# Patient Record
Sex: Female | Born: 1945
Health system: Southern US, Community
[De-identification: ages and names within clinical notes are randomized; demographics above are authoritative.]

## PROBLEM LIST (undated history)

## (undated) DIAGNOSIS — R350 Frequency of micturition: Secondary | ICD-10-CM

## (undated) DIAGNOSIS — J40 Bronchitis, not specified as acute or chronic: Secondary | ICD-10-CM

## (undated) DIAGNOSIS — IMO0002 Reserved for concepts with insufficient information to code with codable children: Secondary | ICD-10-CM

## (undated) DIAGNOSIS — F418 Other specified anxiety disorders: Secondary | ICD-10-CM

## (undated) DIAGNOSIS — G4730 Sleep apnea, unspecified: Secondary | ICD-10-CM

## (undated) DIAGNOSIS — H409 Unspecified glaucoma: Secondary | ICD-10-CM

## (undated) DIAGNOSIS — Z8709 Personal history of other diseases of the respiratory system: Secondary | ICD-10-CM

## (undated) DIAGNOSIS — I10 Essential (primary) hypertension: Secondary | ICD-10-CM

## (undated) DIAGNOSIS — K219 Gastro-esophageal reflux disease without esophagitis: Secondary | ICD-10-CM

## (undated) DIAGNOSIS — E785 Hyperlipidemia, unspecified: Secondary | ICD-10-CM

## (undated) DIAGNOSIS — I739 Peripheral vascular disease, unspecified: Secondary | ICD-10-CM

## (undated) DIAGNOSIS — J189 Pneumonia, unspecified organism: Secondary | ICD-10-CM

## (undated) DIAGNOSIS — Z720 Tobacco use: Secondary | ICD-10-CM

## (undated) DIAGNOSIS — N3281 Overactive bladder: Secondary | ICD-10-CM

## (undated) DIAGNOSIS — M797 Fibromyalgia: Secondary | ICD-10-CM

## (undated) HISTORY — DX: Peripheral vascular disease, unspecified: I73.9

## (undated) HISTORY — PX: BREAST SURGERY: SHX581

## (undated) HISTORY — PX: DILATION AND CURETTAGE OF UTERUS: SHX78

## (undated) HISTORY — PX: COLONOSCOPY: SHX174

## (undated) HISTORY — DX: Gastro-esophageal reflux disease without esophagitis: K21.9

## (undated) HISTORY — DX: Other specified anxiety disorders: F41.8

## (undated) HISTORY — PX: TONSILLECTOMY: SUR1361

## (undated) HISTORY — DX: Tobacco use: Z72.0

## (undated) HISTORY — DX: Bronchitis, not specified as acute or chronic: J40

## (undated) HISTORY — DX: Hyperlipidemia, unspecified: E78.5

## (undated) HISTORY — PX: OTHER SURGICAL HISTORY: SHX169

## (undated) HISTORY — DX: Fibromyalgia: M79.7

## (undated) HISTORY — PX: BUNIONECTOMY: SHX129

## (undated) HISTORY — DX: Essential (primary) hypertension: I10

---

## 2005-08-21 ENCOUNTER — Ambulatory Visit: Payer: Self-pay | Admitting: Family Medicine

## 2005-10-27 ENCOUNTER — Ambulatory Visit: Payer: Self-pay | Admitting: Family Medicine

## 2005-11-10 ENCOUNTER — Ambulatory Visit: Payer: Self-pay | Admitting: Family Medicine

## 2006-05-17 ENCOUNTER — Ambulatory Visit: Payer: Self-pay | Admitting: Family Medicine

## 2006-10-02 ENCOUNTER — Ambulatory Visit: Payer: Self-pay | Admitting: Family Medicine

## 2006-10-16 ENCOUNTER — Ambulatory Visit: Payer: Self-pay | Admitting: Family Medicine

## 2006-10-23 ENCOUNTER — Ambulatory Visit: Payer: Self-pay | Admitting: Family Medicine

## 2006-11-09 ENCOUNTER — Ambulatory Visit: Payer: Self-pay | Admitting: Family Medicine

## 2006-11-23 ENCOUNTER — Ambulatory Visit: Payer: Self-pay | Admitting: Family Medicine

## 2007-01-31 ENCOUNTER — Ambulatory Visit: Payer: Self-pay | Admitting: *Deleted

## 2007-02-28 ENCOUNTER — Encounter: Admission: RE | Admit: 2007-02-28 | Discharge: 2007-02-28 | Payer: Self-pay | Admitting: *Deleted

## 2007-02-28 ENCOUNTER — Ambulatory Visit: Payer: Self-pay | Admitting: *Deleted

## 2007-03-11 ENCOUNTER — Ambulatory Visit (HOSPITAL_COMMUNITY): Admission: RE | Admit: 2007-03-11 | Discharge: 2007-03-11 | Payer: Self-pay | Admitting: *Deleted

## 2007-03-11 ENCOUNTER — Ambulatory Visit: Payer: Self-pay | Admitting: *Deleted

## 2007-03-14 ENCOUNTER — Ambulatory Visit: Payer: Self-pay | Admitting: *Deleted

## 2007-03-28 ENCOUNTER — Ambulatory Visit: Payer: Self-pay | Admitting: *Deleted

## 2007-04-08 ENCOUNTER — Ambulatory Visit: Payer: Self-pay | Admitting: *Deleted

## 2007-04-08 ENCOUNTER — Ambulatory Visit (HOSPITAL_COMMUNITY): Admission: RE | Admit: 2007-04-08 | Discharge: 2007-04-08 | Payer: Self-pay | Admitting: *Deleted

## 2007-04-18 ENCOUNTER — Ambulatory Visit: Payer: Self-pay | Admitting: *Deleted

## 2007-06-06 ENCOUNTER — Ambulatory Visit: Payer: Self-pay | Admitting: *Deleted

## 2007-07-23 ENCOUNTER — Ambulatory Visit: Payer: Self-pay | Admitting: Vascular Surgery

## 2007-09-26 ENCOUNTER — Emergency Department (HOSPITAL_COMMUNITY): Admission: EM | Admit: 2007-09-26 | Discharge: 2007-09-26 | Payer: Self-pay | Admitting: Emergency Medicine

## 2007-10-23 ENCOUNTER — Ambulatory Visit: Payer: Self-pay | Admitting: *Deleted

## 2008-05-04 ENCOUNTER — Other Ambulatory Visit: Admission: RE | Admit: 2008-05-04 | Discharge: 2008-05-04 | Payer: Self-pay | Admitting: Family Medicine

## 2008-05-07 ENCOUNTER — Ambulatory Visit: Payer: Self-pay | Admitting: *Deleted

## 2008-05-14 ENCOUNTER — Ambulatory Visit: Payer: Self-pay | Admitting: Surgery

## 2008-05-14 ENCOUNTER — Ambulatory Visit (HOSPITAL_COMMUNITY): Admission: RE | Admit: 2008-05-14 | Discharge: 2008-05-14 | Payer: Self-pay | Admitting: *Deleted

## 2008-05-28 ENCOUNTER — Ambulatory Visit: Payer: Self-pay | Admitting: *Deleted

## 2008-09-17 ENCOUNTER — Ambulatory Visit: Payer: Self-pay | Admitting: *Deleted

## 2008-10-26 ENCOUNTER — Ambulatory Visit: Payer: Self-pay | Admitting: *Deleted

## 2008-11-20 HISTORY — PX: HAND SURGERY: SHX662

## 2008-12-03 ENCOUNTER — Ambulatory Visit: Payer: Self-pay | Admitting: *Deleted

## 2009-06-03 ENCOUNTER — Ambulatory Visit: Payer: Self-pay | Admitting: *Deleted

## 2009-06-12 ENCOUNTER — Emergency Department (HOSPITAL_COMMUNITY): Admission: EM | Admit: 2009-06-12 | Discharge: 2009-06-12 | Payer: Self-pay | Admitting: Emergency Medicine

## 2009-09-06 ENCOUNTER — Ambulatory Visit (HOSPITAL_BASED_OUTPATIENT_CLINIC_OR_DEPARTMENT_OTHER): Admission: RE | Admit: 2009-09-06 | Discharge: 2009-09-06 | Payer: Self-pay | Admitting: Orthopedic Surgery

## 2010-05-26 ENCOUNTER — Encounter: Admission: RE | Admit: 2010-05-26 | Discharge: 2010-05-26 | Payer: Self-pay | Admitting: Orthopaedic Surgery

## 2010-07-26 ENCOUNTER — Encounter
Admission: RE | Admit: 2010-07-26 | Discharge: 2010-07-26 | Payer: Self-pay | Source: Home / Self Care | Admitting: Orthopaedic Surgery

## 2010-10-27 ENCOUNTER — Ambulatory Visit: Payer: Self-pay

## 2010-10-27 ENCOUNTER — Ambulatory Visit: Payer: Self-pay | Admitting: Vascular Surgery

## 2010-11-04 ENCOUNTER — Ambulatory Visit (HOSPITAL_COMMUNITY)
Admission: RE | Admit: 2010-11-04 | Discharge: 2010-11-04 | Payer: Self-pay | Source: Home / Self Care | Attending: Vascular Surgery | Admitting: Vascular Surgery

## 2010-11-24 ENCOUNTER — Ambulatory Visit
Admission: RE | Admit: 2010-11-24 | Discharge: 2010-11-24 | Payer: Self-pay | Source: Home / Self Care | Attending: Vascular Surgery | Admitting: Vascular Surgery

## 2011-01-30 LAB — POCT I-STAT, CHEM 8
BUN: 18 mg/dL (ref 6–23)
Calcium, Ion: 1.16 mmol/L (ref 1.12–1.32)
Chloride: 108 mEq/L (ref 96–112)
Creatinine, Ser: 0.9 mg/dL (ref 0.4–1.2)
Glucose, Bld: 101 mg/dL — ABNORMAL HIGH (ref 70–99)
HCT: 44 % (ref 36.0–46.0)
Hemoglobin: 15 g/dL (ref 12.0–15.0)
Potassium: 4.1 mEq/L (ref 3.5–5.1)
Sodium: 142 mEq/L (ref 135–145)
TCO2: 25 mmol/L (ref 0–100)

## 2011-01-30 LAB — GLUCOSE, CAPILLARY
Glucose-Capillary: 115 mg/dL — ABNORMAL HIGH (ref 70–99)
Glucose-Capillary: 84 mg/dL (ref 70–99)
Glucose-Capillary: 96 mg/dL (ref 70–99)

## 2011-02-23 LAB — BASIC METABOLIC PANEL
BUN: 5 mg/dL — ABNORMAL LOW (ref 6–23)
CO2: 28 mEq/L (ref 19–32)
Calcium: 9.6 mg/dL (ref 8.4–10.5)
Chloride: 106 mEq/L (ref 96–112)
Creatinine, Ser: 0.75 mg/dL (ref 0.4–1.2)
GFR calc Af Amer: >60 mL/min (ref >60)
GFR calc non Af Amer: >60 mL/min (ref >60)
Glucose, Bld: 101 mg/dL — ABNORMAL HIGH (ref 70–99)
Potassium: 4 mEq/L (ref 3.5–5.1)
Sodium: 142 mEq/L (ref 135–145)

## 2011-02-23 LAB — GLUCOSE, CAPILLARY
Glucose-Capillary: 115 mg/dL — ABNORMAL HIGH (ref 70–99)
Glucose-Capillary: 129 mg/dL — ABNORMAL HIGH (ref 70–99)

## 2011-02-23 LAB — POCT HEMOGLOBIN-HEMACUE: Hemoglobin: 14.2 g/dL (ref 12.0–15.0)

## 2011-02-26 LAB — GLUCOSE, CAPILLARY: Glucose-Capillary: 117 mg/dL — ABNORMAL HIGH (ref 70–99)

## 2011-04-04 NOTE — H&P (Signed)
HISTORY AND PHYSICAL EXAMINATION   October 27, 2010   Re:  Jill Higgins, Jill Higgins               DOB:  1946-03-27   DATE OF SURGERY:  05/14/2008, which consisted of a bilateral lower  extremity runoff with transluminal angioplasty of the right superficial  femoral artery by Dr. Myra Gianotti.   Patient is a very pleasant 65 year old woman who is currently employed  with [employer].  She has been seen and treated in the past by Dr. Madilyn Fireman,  who placed stents into both the left and right SFA at various times.  She was last seen by Dr. Madilyn Fireman 12/03/2008.  Over the past since she was  last seen, she has noticed an increase in her symptoms.  She now feels  that she is severely limited in her work.  She is unable to walk more  than 30 steps without having pain in her left lower extremity and also  has had some episodes of rest pain.  She denies nonhealing lesions or a  history of gangrene.   PAST MEDICAL HISTORY:  Significant for hypertension, hyperlipidemia, and  major risk factor of type 2 diabetes.  She has had little relief with  the Pletal.   SOCIAL HISTORY:  She does smoke approximately 1 pack of cigarettes every  3-4 days.   FAMILY HISTORY:  Noncontributory.   CURRENT MEDICATIONS:  Include Janumet 50/100 b.i.d., temazepam 30 mg  p.o. q.h.s. p.r.n. sleep, Crestor 10 mg p.o. daily, atenolol 50 mg p.o.  daily, Azor 05/40 daily, aspirin 81 mg p.o. daily, Plavix 75 mg daily,  glipizide ER daily, and Ativan 0.5 mg p.r.n.   Physical findings revealed a very pleasant woman in no apparent  distress.  Heart rate was 57, blood pressure 168/89, O2 sat was 99%.  HEENT: PERRLA.  EOMI.  Normal conjunctiva.  Lungs: Clear to  auscultation.  Cardiac exam revealed a regular rate and rhythm.  The  abdomen is soft, nontender, nondistended.  Musculoskeletal exam  demonstrated no major deformities.  Neurological exam was nonfocal.  Evaluation of the skin demonstrated no ulcers or rashes.   Laboratory results demonstrated a SFA popliteal on the right with a  stent, which was triphasic with mild diffuse plaque observed.  The left  SFA appeared occluded from the SFA origin to the distal thigh, where it  was reconstituted with collaterals.   At this time, I asked Dr. Darrick Penna for his assistance in evaluation of  this patient.  He entered the room and evaluated the patient.  Recommended an aortogram with bilateral runoff to define the anatomy of  her lower extremity arterial system.  She is agreeable to this, and this  will be done on 11/04/2010.   Risks benefits and procedure details explained to patient.  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  I agree with the above exam.   Wilmon Arms, PA   Janetta Hora. Fields, MD  Electronically Signed   KEL/MEDQ  D:  10/27/2010  T:  10/27/2010  Job:  [id number]

## 2011-04-04 NOTE — Assessment & Plan Note (Signed)
OFFICE VISIT   [name], [name]  DOB:  Aug 12, 1946                                       04/18/2007  ZOXWR#:[id number]   The patient returns to the office today now status post bilateral lower  extremity SFA intervention.  Most recently underwent right SFA  angioplasty on Apr 08, 2007.  This was carried out for moderate diffuse  SFA disease.  She underwent left PTA and stent March 11, 2007.   She is out of Plavix at this time.  She has noticed some continued  symptoms in her right leg, however, the left leg is free of symptoms.   Permission to return to work as of Monday April 22, 2007.  Return to the  office in approximately 1 month for ABIs.   Balinda Quails, M.D.  Electronically Signed   PGH/MEDQ  D:  04/18/2007  T:  04/18/2007  Job:  [id number]

## 2011-04-04 NOTE — Assessment & Plan Note (Signed)
OFFICE VISIT   Jill Higgins, Jill Higgins  DOB:  1946/07/23                                       11/24/2010  GNFAO#:[id number]   The patient returns for follow-up today.  She underwent aortogram with  bilateral lower extremity runoff for left leg claudication on December  16.  This showed, as predicted, a long left superficial femoral artery  occlusion.  She did have reconstitution of a short segment of the above-  knee popliteal artery right at the knee joint.  Of note, she also had  aberrant anterior tibial artery takeoff coming right at the level of the  knee joint as well.  In light of this patient, anatomically speaking,  would possibly be a candidate for a femoral to above-knee popliteal  bypass but more likely would require a below-knee bypass or possibly  even an anterior tibial artery bypass.  I had a lengthy discussion with  the patient today regarding this and she has had several weeks to think  about the possible options of conservative management of smoking  cessation combined with a walking program versus considering a bypass  operation for her left leg.   PHYSICAL EXAMINATION TODAY:  Blood pressure 135/81 in the left arm,  heart rate 62 and regular, temperature is 98.  Skin:  She has no open  ulcers or rashes on her feet.   In discussions with the patient today we talked about durability of  bypasses versus limb loss long-term.  I discussed with her that the  durability of a below-knee bypass would probably be 75% at 5 years, at  which time she would be at risk of limb loss.  I also discussed with her  that her risk of limb loss lifelong if she is able to quit smoking and  begin a walking program would be less than 5% overall.  She has opted at  this point for conservative management with an attempt at smoking  cessation and a walking program.  She is going to start nicotine patches  soon.  We will also consider placing her on Chantix if she does not  have  success with nicotine patches alone.  We could also revisit in the  future a bypass operation if she wishes to do that at some point.   She will follow up with me in 6 months' time.     Janetta Hora. Fields, MD  Electronically Signed   CEF/MEDQ  D:  11/24/2010  T:  11/25/2010  Job:  [id number]   cc:   Dario Guardian, M.D.

## 2011-04-04 NOTE — Assessment & Plan Note (Signed)
OFFICE VISIT   Jill Higgins, Jill Higgins  DOB:  [birth year]                                       09/17/2008  ZOXWR#:[id number]   The patient returned to the office today for continued monitoring of her  peripheral vascular disease.  She has a history of a left superficial  femoral artery stent placement along with right SFA PTA.  She continues  to be disabled by claudication symptoms.  Unfortunately her disease  pattern is one that is very difficult to treat from a percutaneous point  of view.  She has extensive plaque throughout superficial femoral  arteries bilaterally with diffuse disease.  No single dominant lesion  for angioplasty and stenting.   She continues to claudicate at one to two blocks.  Unable to walk an  incline at all without significant discomfort.  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   Ankle brachial indices 0.65 on the right and 0.47 on the left.  Blood  pressure is 125/84, pulse is 64 per minute.  The patient appears  generally well and in no distress.  She is alert and oriented.  Lower  extremities are well-perfused.  No acute ischemic changes.   The patient remains significantly disabled by claudication symptoms.  Difficult to treat without major operative reconstruction.  I have  recommended conservative treatment at this time.  I have prescribed  Pletal 100 mg b.i.d.  Follow up in 6 months.   Balinda Quails, M.D.  Electronically Signed   PGH/MEDQ  D:  09/17/2008  T:  09/18/2008  Job:  [id number]

## 2011-04-04 NOTE — Procedures (Signed)
VASCULAR LAB EXAM   INDICATION:  Followup right SFA PTA.   HISTORY:  Diabetes:  Yes.  Cardiac:  No.  Hypertension:  Yes.   EXAM:  Duplex of the right lower extremity arterial system.   IMPRESSION:  1. Increased velocity (high-grade stenosis noted in the right      superficial femoral and common femoral artery origin).  2. Monophasic Doppler waveform noted throughout the right superficial      femoral system, popliteal, posterior tibial and peroneal arteries.   ___________________________________________  P. Liliane Bade, M.D.   MG/MEDQ  D:  09/17/2008  T:  09/17/2008  Job:  [id number]

## 2011-04-04 NOTE — Assessment & Plan Note (Signed)
OFFICE VISIT   Jill Higgins, Jill Higgins  DOB:  11-01-1946                                       06/06/2007  CHART#:[id number]   The patient is now status post bilateral superficial femoral artery  interventions with PTA of the right SFA and PTA and stenting of the left  SFA.   Ankle brachial indices measure 0.80 on the right, 0.86 on the left.  Wave forms are biphasic bilaterally.   The patient does continue to complain of some pain in her right leg.   BP 152/103, pulse 80.  Her lower extremities are well perfused.  Femoral  pulses are intact.  1+ posterior tibial, dorsalis pedis bilaterally.   The patient has had good result following her interventions.  I do think  it would be worthwhile to continue to follow her closely, and we will  plan to get lower extremity Dopplers in 6 months.   Balinda Quails, M.D.  Electronically Signed   PGH/MEDQ  D:  06/06/2007  T:  06/07/2007  Job:  [id number]   cc:   Dario Guardian, M.D.

## 2011-04-04 NOTE — Op Note (Signed)
Jill Higgins, Jill Higgins NO.:  [id number]   MEDICAL RECORD NO.:  [id number]          PATIENT TYPE:  AMB   LOCATION:  SDS                          FACILITY:  [facility]   PHYSICIAN:  Balinda Quails, M.D.    DATE OF BIRTH:  December 27, 1945   DATE OF PROCEDURE:  04/08/2007  DATE OF DISCHARGE:  04/08/2007                               OPERATIVE REPORT   PHYSICIAN:  Denman George, MD   DIAGNOSIS:  Right lower extremity claudication.   PROCEDURE:  1. Right lower extremity arteriogram.  2. Right superficial femoral artery percutaneous transluminal      angioplasty.  3. Retrograde left femoral arteriogram.  4. StarClose, left common femoral artery.   ACCESS:  Left common femoral artery 6-French sheath.   CONTRAST:  140 mL of Visipaque.   COMPLICATIONS:  None apparent.   CLINICAL NOTE:  Jill Higgins is a 65 year old female with type 2  diabetes.  Previous history of left superficial femoral artery  angioplasty and stenting for chronic total occlusion with excellent  technical result.  She has symptoms of claudication in her right lower  extremity.  Right ankle-brachial index measures 0.83; she can walk  approximately 1 block to 1.5 blocks prior to developing calf  claudication.  She has undergone previous duplex scan of the right  superficial femoral artery, revealing an area of 75% stenosis.   At the patient's request, she returns the cath lab at this time for  diagnostic right lower extremity arteriogram and possible percutaneous  intervention.  The potential risks of the procedure requiring major  surgery of approximately 1% were discussed with the patient.   PROCEDURE NOTE:  The patient was brought to cath lab in stable condition  and placed in supine position.  The left groin was prepped and draped in  a sterile fashion.   The skin and subcutaneous tissue in the left groin were instilled with  1% Xylocaine.  A needle was introduced in the left common femoral  artery.  A 0.035 Wholey guidewire was advanced through the needle into  the mid abdominal aorta.  The needle was removed, site opened with an 11  blade and forceps.  A 6-French Terumo sheath was advanced over the  guidewire up to the aortic bifurcation.  The dilator was removed.  A  crossover catheter was advanced over the guidewire, the Wholey guidewire  advanced down into the right external iliac artery.  The catheter was  removed and the dilator placed back in the Terumo sheath, which was then  advanced over the bifurcation into the right external iliac artery.  Right lower extremity arteriogram was obtained; this revealed moderate  diffuse disease of the right superficial femoral artery.  There were 3  distinct lesions present, 1 in the proximal third of the superficial  femoral artery with a short 50% stenosis, an irregular area of 40% to  50% stenosis in the mid thigh, and then approximately a 50% lesion at  the adductor canal.  The superficial femoral artery was patent inferior  to this.  The popliteal artery was patent.  The tibioperoneal  trunk was  patent.  The anterior tibial and posterior tibial arteries provided  dominant runoff to the right foot.  A small peroneal artery was present.  The right profunda femoris artery was intact with moderate  collateralization into the popliteal artery via geniculate collaterals.   The patient was administered 4000 units of heparin intravenously.  She  also received a total of 3 mg of Versed and 100 mcg of fentanyl  intravenously.  ACT measured 230 seconds.   The Wholey guidewire was then passed down across the right superficial  femoral artery lesions in the right popliteal artery.  A Agilitrac 5 x  40 balloon was then advanced over the guidewire.  The balloon was  inflated for total of 4 total inflations in the areas of stenosis in the  right superficial femoral artery.  There was clear wasting of the  balloon at the adductor canal lesion  and also and the most proximal  lesion of the right superficial femoral artery.   The balloon was then removed and a completion arteriogram obtained.  This did reveal an area of focal dissection in the mid right superficial  femoral artery.  This was re-angioplastied with the Agilitrac 5 x 40  balloon for a total of 8 atmospheres for 60 seconds.  At completion,  arteriogram was once again obtained and revealed improved technical  result.   It was not felt that stenting was required; there were no flow-limiting  lesions present at termination of the procedure.   A final runoff arteriogram of the tibial vessels was obtained and this  failed to reveal any evidence of distal embolization or other distal  complication.   The guidewire was then removed.  The sheath was drawn back into the left  external iliac artery.  A left retrograde femoral arteriogram was  obtained; this verified position of the sheath in the left common  femoral artery.  A guidewire was reinserted, sheath removed and a  StarClose advanced over the guidewire.  The area was reprepped and  draped.  StarClose device was then inserted and deployed in the left  common femoral artery.  The device was removed.  No apparent  complications.  Pressure was briefly placed on the left groin.   FINAL IMPRESSION:  1. Limiting right lower extremity claudication associated with 3      distinct moderate lesions in the right superficial femoral artery.  2. Successful percutaneous transluminal angioplasty, right superficial      femoral artery, without apparent complication.   FOLLOWUP:  The patient will be discharged with followup the office in  approximately 4-6 weeks.  Remain on Plavix for 6 weeks.      Balinda Quails, M.D.  Electronically Signed     PGH/MEDQ  D:  04/08/2007  T:  04/09/2007  Job:  [id number]

## 2011-04-04 NOTE — Procedures (Signed)
BYPASS GRAFT EVALUATION   INDICATION:  Follow-up evaluation of lower extremity arterial stents.   HISTORY:  Diabetes:  Yes.  Cardiac:  No.  Hypertension:  Yes.  Smoking:  Pack per day for 38 years.  Previous Surgery:  Right distal superficial femoral artery PTA and stent  on 04/08/07.  Left distal superficial femoral artery PTA and stent on  03/11/07 by Dr. Madilyn Fireman.   SINGLE LEVEL ARTERIAL EXAM                               RIGHT              LEFT  Brachial:                    137                139  Anterior tibial:             99                 87  Posterior tibial:            99                 79  Peroneal:  Ankle/brachial index:        0.71               0.63   PREVIOUS ABI:  Date: 10/23/07  RIGHT:  0.75  LEFT:  0.75   LOWER EXTREMITY BYPASS GRAFT DUPLEX EXAM:   DUPLEX:  1. Doppler arterial waveforms are monophasic in the common femoral      arteries, superficial femoral artery, and popliteal artery with the      peak systolic velocity of 521 cm/s at the right superficial femoral      artery origin.  The stent in the superficial femoral artery is      patent with no evidence of restenosis.  2. On the left, Doppler arterial waveforms are triphasic in the common      femoral artery.  There is a peak systolic velocity of 292 cm/s at      the left superficial femoral artery origin.  There is a peak      systolic velocity of 428 cm/s within the left superficial femoral      artery stent.   IMPRESSION:  1. Right ankle brachial index is stable from previous study.  2. The left ankle brachial index is lower than previously recorded.  3. Greater than 50% superficial femoral arteries origin stenosis      bilaterally.  4. In stent restenosis of the left superficial femoral artery stent.     ___________________________________________  P. Liliane Bade, M.D.   MC/MEDQ  D:  05/07/2008  T:  05/07/2008  Job:  [id number]

## 2011-04-04 NOTE — Procedures (Signed)
LOWER EXTREMITY ARTERIAL EVALUATION-SINGLE LEVEL   INDICATION:  Follow up bilateral superficial femoral artery stents.   HISTORY:  Diabetes:  Yes.  Cardiac:  No.  Hypertension:  Yes.  Smoking:  Yes one pack per day for 38 years.  Previous Surgery:  Right distal superficial femoral artery PTA and stent  on 04/08/2007, left distal superficial femoral artery PTA and stent on  03/11/2007, both by Dr. Madilyn Fireman.   RESTING SYSTOLIC PRESSURES: (ABI)                          RIGHT                LEFT  Brachial:               138                  130  Anterior tibial:        100                  104 (0.75)  Posterior tibial:       104 (0.75)           88  Peroneal:  DOPPLER WAVEFORM ANALYSIS:  Anterior tibial:        Monophasic           Monophasic  Posterior tibial:       Monophasic           Monophasic  Peroneal:   PREVIOUS ABI'S:  Date: July 23, 2007  RIGHT:  0.86  LEFT:  078   IMPRESSION:  1. Increased velocities in the proximal right superficial femoral      artery by Duplex.  Velocities are 419 cm per second and are stable      from previous exam.  2. Increased velocities in the left proximal to mid superficial      femoral artery are 430 cm per second and are stable from previous      exam.  3. Bilateral distal superficial femoral artery stents are patent.  4. Right ABI is decreased and left ABI is stable from previous exam.   ___________________________________________  P. Liliane Bade, M.D.   DP/MEDQ  D:  10/23/2007  T:  10/24/2007  Job:  [id number]

## 2011-04-04 NOTE — Procedures (Signed)
LOWER EXTREMITY ARTERIAL DUPLEX   INDICATION:  Follow up known peripheral vascular disease.   HISTORY:  Diabetes:  Yes.  Cardiac:  No.  Hypertension:  Yes.  Smoking:  Yes.  Previous Surgery:  Right distal SFA stent, 04/08/2007.  Again,  05/28/2008.  Left distal SFA stent 03/11/2007.   SINGLE LEVEL ARTERIAL EXAM                          RIGHT                LEFT  Brachial:               132                  133  Anterior tibial:        119                  72  Posterior tibial:       102                  70  Peroneal:  Ankle/Brachial Index:   0.89                 0.54   LOWER EXTREMITY ARTERIAL DUPLEX EXAM   PREVIOUS ANKLE BRACHIAL INDEX:  06/03/2009  Right:  0.85  Left:  0.59   DUPLEX:  1. Right stent not identified; however, flow throughout SFA and pop is      triphasic with mild diffuse plaque observed.  2. Left SFA/stent appears occluded from SFA origin to distal thigh      where collaterals reconstitute the vessel.   IMPRESSION:  1. Patent right superficial femoral artery/superficial femoral artery      stent with mild diffuse plaquing.  2. Occluded left superficial femoral artery/superficial femoral artery      stent.   ___________________________________________  Janetta Hora. Fields, MD   LT/MEDQ  D:  10/27/2010  T:  10/27/2010  Job:  [id number]

## 2011-04-04 NOTE — Assessment & Plan Note (Signed)
OFFICE VISIT   Jill Higgins, Jill Higgins  DOB:  08-17-1946                                       05/28/2008  EAVWU#:[id number]   The patient has undergone an arteriogram by Dr. Myra Gianotti on 05/14/2008.  Did carry out a PTA of the right superficial femoral artery.  There was,  however, evidence of a severe right profunda origin stenosis along with  a high-grade left superficial femoral artery stenosis at its origin.   On review of these arteriograms there is quite extensive plaque in the  superficial femoral arteries bilaterally, this is not an ideal situation  for percutaneous intervention.  The left leg indeed does have  significant plaque at the origin of the SFA which would not be ideally  treated with angioplasty.   Due to the extensive nature of the plaque I have recommended we use  conservative treatment for the time being and have placed the patient on  Pletal 50 mg b.i.d. and will plan to follow up with her in 3 months to  see how she is progressing.   Balinda Quails, M.D.  Electronically Signed   PGH/MEDQ  D:  05/28/2008  T:  05/29/2008  Job:  [id number]   cc:   Dario Guardian, M.D.

## 2011-04-04 NOTE — Procedures (Signed)
BYPASS GRAFT EVALUATION   INDICATION:  For the evaluation of lower extremity arterial.   HISTORY:  Diabetes:  Yes.  Cardiac:  No.  Hypertension:  Yes.  Smoking:  Yes.  Previous Surgery:  Right distal superficial femoral artery PTA and stent  on 04/08/2007, right superficial femoral artery PTA again on 05/28/2008,  left distal superficial femoral artery PTA and stent on 03/11/2007.   SINGLE LEVEL ARTERIAL EXAM                               RIGHT              LEFT  Brachial:                    149                144  Anterior tibial:             125                88  Posterior tibial:            127                85  Peroneal:  Ankle/brachial index:        0.85               0.59   PREVIOUS ABI:  Date:  12/03/2008  RIGHT:  0.74  LEFT:  0.5   LOWER EXTREMITY BYPASS GRAFT DUPLEX EXAM:   DUPLEX:  Biphasic duplex waveform noted throughout the right common  femoral, proximal profunda femoral, superficial femoral and popliteal  arteries with an increased velocity of 534 cm/s noted at the proximal  superficial femoral artery.   IMPRESSION:  1. Patent right femoral artery stent with increased duplex velocity      suggesting >75% at the proximal superficial femoral artery.  2. Right ABI suggests mild arterial disease with biphasic Doppler      waveform.  3. Left lower extremity ABI suggests moderate to severe arterial      disease with no resistant biphasic Doppler waveform.        ___________________________________________  P. Liliane Bade, M.D.   AC/MEDQ  D:  06/03/2009  T:  06/03/2009  Job:  [id number]

## 2011-04-04 NOTE — Op Note (Signed)
NAME:  Jill Higgins, Jill Higgins               ACCOUNT NO.:  [id number]   MEDICAL RECORD NO.:  [id number]          PATIENT TYPE:  AMB   LOCATION:  SDS                          FACILITY:  [facility]   PHYSICIAN:  Juleen China IV, MDDATE OF BIRTH:  06/26/1946   DATE OF PROCEDURE:  DATE OF DISCHARGE:  05/14/2008                               OPERATIVE REPORT   PREOPERATIVE DIAGNOSIS:  Bilateral claudication.   POSTOPERATIVE DIAGNOSIS:  Bilateral claudication.   PROCEDURES PERFORMED:  1. Ultrasound-guided access, left common femoral artery.  2. Abdominal aortogram.  3. Bilateral lower extremity runoff.  4. Percutaneous transluminal angioplasty, right superficial femoral      artery.  5. Third order catheterization.   PROCEDURE:  The patient identified in the holding area and taken to room  8, she placed supine on the table.  Bilateral groins were prepped and  draped a standard sterile fashion.  A time-out was called.  The left  common femoral artery was evaluated with ultrasound and found to be  patent.  Lidocaine 1% was used for local anesthesia.  Left common  femoral artery was accessed with a micropuncture needle.  An 0.018  mandril wire was then advanced into iliac arterial system under  fluoroscopic visualization.  Next, a micropuncture sheath was placed.  The mandril wire was removed and a 0.035 Bentson wire was advanced into  the aorta under fluoroscopic visualization.  Micropuncture sheath was  removed and a 5-French sheath was placed over the wire and Omni flush  catheter was placed at the level L1 and abdominal aortogram was  obtained.  Next, the catheter was pulled down the aortic bifurcation and  bilateral lower extremity runoff was obtained.   FINDINGS:  Aortogram:  Visualized portions of suprarenal abdominal aorta  showed minimal disease.  There are single renal arteries bilaterally  which are widely patent.  The infrarenal abdominal aorta is widely  patent with minimal  disease.   Pelvic angiogram:  The right common and external iliac artery are small,  but patent without stenosis.  The right hypogastric artery is patent.  The left common and external iliac artery are small, but patent without  significant disease.  The left hypogastric artery is patent.   Right lower extremity:  The right common femoral artery is patent.  There is a mild to moderate stenosis at the origin of the right  superficial femoral artery.  There is a high-grade stenosis at the  origin of the right profunda femoral artery, which immediately  bifurcates.  The right superficial femoral artery is diminutive small,  but patent throughout its course.  There is mild disease at the adductor  canal.  The popliteal artery is patent.  The patient has 3-vessel runoff  with mild to moderate disease due to patient's positioning.  This is a  limited evaluation of the tibial vessels on the right.   Left lower extremity:  The left common femoral artery is patent.  The  left superficial femoral artery is a small in caliber.  There is a high-  grade stenosis at its origin.  Left profunda femoral artery  is patent.  The stent visualized within the left superficial femoral artery with in-  stent stenosis approximately 70%.  The popliteal artery is patent.  Limited evaluation of the tibial vessels was performed.  The patient has  a dominant anterior tibial artery.   Intervention:  After the above images were obtained, decision was made  to intervene.  The aortic bifurcation was crossed with a Bentson wire  and an Omni flush catheter.  A 6-French sheath was placed over the  aortic bifurcation of the right external iliac artery.  Multiple images  were obtained to further evaluate the stenosis at the common femoral  bifurcation.  The patient's main problem is her profunda stenosis.  However, there is disease within her proximal superficial femoral  artery.  I elected to perform primary balloon  angioplasty this area.  A  4 x 2-mm balloon was taken to nominal pressure.  Follow up arteriogram  was performed, which revealed improvement in the flow through this area.  I did not at this time want to proceed with percutaneous intervention of  her profunda femoral artery, given that there was a high likelihood of  obstructing flow to one of her profunda branches.  At this point, I  elected to terminate the procedure.  Catheters and wires removed, and  sheath was pulled back to left external iliac artery.  The patient was  taken to the holding area for sheath pull.  She was heparinized for the  interventional portion of this procedure.  There were no complications.   IMPRESSION:  1. High-grade right profunda femoral artery stenosis.  2. Right proximal superficial femoral artery stenosis dilated with a 4-      mm balloon.  3. High-grade left superficial femoral artery stenosis at its origin.  4. In-stent restenosis of the left superficial femoral artery.           ______________________________  V. Charlena Cross, MD  Electronically Signed     VWB/MEDQ  D:  05/16/2008  T:  05/17/2008  Job:  [id number]

## 2011-04-04 NOTE — Procedures (Signed)
BYPASS GRAFT EVALUATION   INDICATION:  Followup bilateral superficial femoral artery stents.   HISTORY:  Diabetes:  Yes.  Cardiac:  No.  Hypertension:  Yes.  Smoking:  Yes.  Previous Surgery:  Right distal superficial femoral artery PTA and stent  on 04/08/2007 with a right superficial femoral artery PTA again on  05/28/2008, left distal superficial femoral artery PTA and stent on  03/11/2007.   SINGLE LEVEL ARTERIAL EXAM                               RIGHT              LEFT  Brachial:                    132                128  Anterior tibial:             98                 66  Posterior tibial:            88                 60  Peroneal:  Ankle/brachial index:        0.74               0.5   PREVIOUS ABI:  Date: 09/17/2008  RIGHT:  0.66  LEFT:  0.49   LOWER EXTREMITY BYPASS GRAFT DUPLEX EXAM:   DUPLEX:  Biphasic Doppler waveforms noted throughout the right common  femoral, proximal profunda femoral, superficial femoral and popliteal  arteries with an increased velocity of 511 cm/sec noted at the  superficial femoral artery origin.  Totally occluded left proximal to distal superficial femoral artery with  dampened, collateral fed flow noted in the left popliteal artery.  Patent biphasic flow noted in the common femoral and profunda femoral  arteries.   IMPRESSION:  1. Patent right superficial femoral artery stent with Doppler velocity      suggestive of greater than 75% stenosis at the right superficial      femoral artery origin.  2. Totally occluded left superficial femoral artery with collateral      circulation noted, as described above.  3. Stable bilateral ankle brachial indices.   ___________________________________________  P. Liliane Bade, M.D.   CH/MEDQ  D:  12/03/2008  T:  12/03/2008  Job:  [id number]

## 2011-04-04 NOTE — Assessment & Plan Note (Signed)
OFFICE VISIT   LANA, FLAIM  DOB:  December 29, 1945                                       12/03/2008  CHART#:[id number]   The patient returns to the office today for continued followup of her  peripheral vascular disease.  She has known long left superficial  femoral artery occlusion, has previously undergone right superficial  femoral artery PTA and stenting.  She is primarily disabled by left  lower extremity claudication.  Short distance claudication occurring at  about approximately one block.  [place] due  to the severity of her symptoms.  No night pain or rest pain.  Denies  nonhealing lesions.  No history of gangrene.   Her main risk factor is type 2 diabetes.  She does also have a history  of hypertension and hyperlipidemia.   She has noted little relief with Pletal.   Her ankle brachial indices are 0.74 on the right and 0.5 on the left.  Her right superficial femoral artery stent is patent although there is  an area of proximal stenosis evident.   PHYSICAL EXAMINATION:  General:  Evaluation reveals a generally well-  appearing 65 year old female.  Vital signs:  BP 112/73, pulse 63 per  minute.  Lower extremities:  Reveal intact femoral pulses bilaterally.  No popliteal, posterior tibial or dorsalis pedis pulses palpable.  No  ankle edema.  Feet are well-perfused.  No ischemic skin changes.   The patient has stable peripheral vascular disease, remains disabled by  left lower extremity claudication.  No plans for intervention at this  time as she is not at any limb salvage level.  Will continue monitoring  with followup in 6 months.   Balinda Quails, M.D.  Electronically Signed   PGH/MEDQ  D:  12/03/2008  T:  12/04/2008  Job:  [id number]   cc:   Dario Guardian, M.D.

## 2011-04-04 NOTE — Procedures (Signed)
BYPASS GRAFT EVALUATION   INDICATION:  Follow up bilateral superficial femoral artery PTA and  stents.   HISTORY:  Diabetes:  Yes  Cardiac:  Hypertension:  Yes  Smoking:  Previous Surgery:  Right superficial femoral artery PTA and stent on Apr 08, 2007, by Dr. Madilyn Fireman.  Left superficial femoral artery PTA and stent  on March 11, 2007, by Dr. Madilyn Fireman.   SINGLE LEVEL ARTERIAL EXAM                               RIGHT              LEFT  Brachial:                    180                178  Anterior tibial:             140                140  Posterior tibial:            154                64  Peroneal:  Ankle/brachial index:        0.86               0.78   PREVIOUS ABI:  Date: June 08, 2007  RIGHT:  0.80  LEFT:  0.86   LOWER EXTREMITY BYPASS GRAFT DUPLEX EXAM:   DUPLEX:  Increased velocities in the right superficial femoral artery  proximally at 443 cm/sec.  Velocities in the right leg stent at the  distal superficial femoral artery are within normal limits.   Velocities in the left proximal superficial femoral artery are elevated  at 437 cm/sec.  Stent velocities in the distal left SFA are within  normal limits.   IMPRESSION:  1. Patent bilateral superficial femoral artery stents.  2. Elevated velocities in both proximal superficial femoral arteries      at 443 cm/sec. on the right and 437 cm/sec. on the left.  3. Ankle/brachial indices are stable bilaterally from previous exam.   ___________________________________________  P. Liliane Bade, M.D.   DP/MEDQ  D:  07/23/2007  T:  07/24/2007  Job:  [id number]

## 2011-04-04 NOTE — Assessment & Plan Note (Signed)
OFFICE VISIT   Jill Higgins, Jill Higgins  DOB:  [birth year]                                       05/07/2008  CHART#:[id number]   The patient has a history of bilateral superficial femoral artery  occlusions with PTA of the right SFA and PTA and stenting of the left  SFA.  These were carried out approximately a year ago.  Her maximal  ankle brachial indices obtained were 0.8 on the right and 0.86 on the  left.   Her ABIs have been gradually decreasing and she has had increasing  symptoms of claudication.  ABIs are now 0.71 on the right and 0.63 on  the left.   Imaging verifies a stenosis at the origin of the right superficial  femoral artery and a stenosis at the origin of the left superficial  femoral artery, and in-stent restenosis in the left SFA stent.   These findings are consistent with her symptoms.  We will plan to go  ahead and obtain an arteriogram with possible re-intervention next week  05/13/2008.  She will begin Plavix 75 mg daily in addition to her  aspirin at this time.   Balinda Quails, M.D.  Electronically Signed   PGH/MEDQ  D:  05/07/2008  T:  05/08/2008  Job:  [id number]

## 2011-04-07 NOTE — Op Note (Signed)
NAMEDARRELLE, BARRELL NO.:  [id number]   MEDICAL RECORD NO.:  [id number]          PATIENT TYPE:  AMB   LOCATION:  SDS                          FACILITY:  [facility]   PHYSICIAN:  Balinda Quails, M.D.    DATE OF BIRTH:  1945/12/07   DATE OF PROCEDURE:  03/11/2007  DATE OF DISCHARGE:                               OPERATIVE REPORT   PHYSICIAN:  Denman George, MD.   DIAGNOSIS:  Bilateral lower extremity claudication.   PROCEDURES:  1. Abdominal aortogram with bilateral lower extremity runoff      arteriography.  2. Selective left lower extremity arteriogram.  3. Left superficial femoral artery PTA/stent.  4. Retrograde right femoral arteriogram.   ACCESS:  Right common femoral artery 6-French sheath.   CONTRAST:  250 miles Visipaque.   COMPLICATIONS:  None apparent   CLINICAL NOTE:  Jill Higgins is a 65 year old female with a history of  increasing bilateral claudication.  This most severely affects her left  leg.  Primarily calf claudication.  Occurs at one block.  Doppler  evaluation consistent with bilateral superficial femoral artery  occlusive disease.   Brought to the cath lab at this time for diagnostic arteriography,  possible percutaneous intervention.  Potential risks of the procedure  discussed with the patient of major morbidity and mortality of 1%.  The  patient received preoperative Plavix and aspirin.   PROCEDURE NOTE:  The patient brought to the cath lab in stable  condition.  Informed consent obtained.  Both groins prepped and draped  in sterile fashion.   Skin and subcutaneous tissue of right groin instilled with 1% Xylocaine.  A needle was easily induced in the right common femoral artery.  A 0.035  Wholey guidewire advanced through the needle to the mid abdominal aorta.  The needle removed.  A 5-French sheath advanced over the guidewire.  The  dilator removed, sheath flushed with heparin saline solution.   A standard pigtail catheter  advanced over the guidewire to the mid  abdominal aorta.  Standard AP mid abdominal aortogram obtained.  This  revealed widely patent single renal arteries bilaterally.  The  infrarenal aorta was normal.  The aortic bifurcation was widely patent.  The common iliac arteries revealed no significant plaque.   The pigtail catheter brought down to the aortic bifurcation.  Bilateral  lower extremity runoff arteriography obtained.   The right leg revealed a patent external iliac artery.  The right common  femoral and profunda femoris artery were patent.  The right superficial  femoral artery revealed mild to moderate diffuse disease.  There was an  area of focal stenosis noted at the level of the mid thigh.   The popliteal artery was patent.  The tibioperoneal trunk revealed mild  disease.  Dominant runoff was the anterior tibial artery.  A small  posterior tibial artery and peroneal artery were present.   Left lower extremity runoff revealed intact external iliac and profunda  femoris arteries.  The left common femoral artery was widely patent.  The proximal left superficial femoral artery revealed mild to moderate  disease with  long area of stenosis moderate severity.  At the level of  the adductor canal, there were two areas of high-grade stenosis followed  by a total occlusion.  The total occlusion was short, measuring 4-5 cm.  Reconstitution of the popliteal artery.  The left popliteal artery was  then intact.  There was an aberrant takeoff of the anterior tibial  artery which was a dominant runoff of the left lower extremity.  The  tibioperoneal trunk provided flow to a very small atretic posterior  tibial artery and minimal flow into a small peroneal artery.   The pigtail catheter was then brought down to the aortic bifurcation,  hooked down the bifurcation and a Wholey guidewire advanced into the  left common femoral artery.  The catheter and sheath then removed.  A 6-  French  Terumo Pinnacle sheath advanced over the guidewire to the left  external iliac artery.  The dilator removed and sheath flushed with  heparin saline solution.  The patient administered 5000 units of heparin  intravenously.  ACT measured 280 seconds.  The guidewire was then passed  into the left superficial femoral artery and down to the adductor canal  where there were two high grade stenotic lesions followed by a short  segment occlusion.  A end-hole catheter advanced over the guidewire.  Using the end-hole catheter for support, the [facility] wire easily passed  across the occlusion into the left popliteal artery.  The end-hole  catheter advanced in the left popliteal artery and contrast injection  verified a intraluminal position beyond the occlusion.   A 4 x 10 Agiltrac balloon was then advanced over the guidewire, inflated  to 10 atmospheres x75 seconds, followed by second inflation of 8  atmospheres x66 seconds.  The balloon then removed and a post dilatation  arteriogram obtained.  This revealed patency post dilatation.  However,  there was suboptimal result with focal dissection.   A 6 x 100 Exceed stent was then advanced over the guidewire and deployed  at the area of angioplasty in this left superficial femoral artery at  the adductor canal.  Following deployment, the catheter removed and a  completion arteriogram obtained.  This revealed an area of apparent  floating thrombus at the distal margin of the stent.  Therefore an end-  hole catheter was advanced over the guidewire and used to aspirate at  the distal margin of the stent and a small amount of thrombus was  returned.  Following aspiration, further injection of contrast failed to  reveal any more thrombus.     Further views of the left superficial femoral artery did reveal an  area of mild stenosis at the proximal margin of the stent and this was  redilated with the Agiltrac 4 x 10 balloon for 8 atmospheres x60 seconds.   Also a post deployment dilatation of the stent was carried out  at 8 atmospheres x62 seconds.   Following this, completion arteriograms of the left lower extremity were  obtained.  The left superficial femoral artery was patent with a area of  proximal mild to moderate stenosis.  The area of stent deployment was  widely patent.  Brisk flow was present.  No evidence of further  thrombus.  Intact runoff, dominant vessel left anterior tibial with a  aberrant takeoff tibioperoneal trunk patent, small atretic left  posterior tibial and subtotal occlusion of the left peroneal artery.   This completed the interventional procedure.  The guidewire was removed.  The sheath drawn back  into the right external iliac artery.  A  retrograde injection then made through the sheath for possible closure  device.  This, however, revealed a diseased right common femoral artery.  Due to the presence of plaque and disease, it was decided not to carry  out a closure procedure.  The ACT was rechecked at 226 seconds.  The  patient will be transferred to the holding area for planned removal of  right femoral sheath followed by discharge home.  Plan is 6 weeks of  Plavix with office follow-up at that time.   FINAL IMPRESSION:  1. Widely patent bilateral renal arteries.  2. Normal aortoiliac segment.  3. Left superficial femoral artery occlusive disease with short      segment occlusion at the adductor canal.  4. Right superficial femoral artery occlusive disease with area of      moderate to high grade stenosis at the adductor canal.  5. Successful angioplasty and stenting left superficial femoral artery      occlusion with recanalization.   DISPOSITION:  These results will be reviewed with the patient and  family, follow-up appointment made for 6 weeks in the office and planned  intervention of the right lower extremity.      Balinda Quails, M.D.  Electronically Signed     PGH/MEDQ  D:  03/11/2007  T:   03/11/2007  Job:  [id number]   cc:   Dario Guardian, M.D.

## 2011-08-17 LAB — CBC
HCT: 36
Hemoglobin: 12.8
MCHC: 35.5
MCV: 89.3
Platelets: 262
RBC: 4.03
RDW: 13.3
WBC: 8.4

## 2011-08-17 LAB — BASIC METABOLIC PANEL
BUN: 10
CO2: 25
Calcium: 9.2
Chloride: 108
Creatinine, Ser: 0.6
GFR calc Af Amer: >60
GFR calc non Af Amer: >60
Glucose, Bld: 155 — ABNORMAL HIGH
Potassium: 3.6
Sodium: 140

## 2011-08-17 LAB — PROTIME-INR
INR: 0.9
Prothrombin Time: 12

## 2011-08-29 LAB — DIFFERENTIAL
Basophils Absolute: 0
Basophils Relative: 1
Eosinophils Absolute: 0.2
Eosinophils Relative: 4
Lymphocytes Relative: 48 — ABNORMAL HIGH
Lymphs Abs: 2.8
Monocytes Absolute: 0.4
Monocytes Relative: 7
Neutro Abs: 2.3
Neutrophils Relative %: 41 — ABNORMAL LOW

## 2011-08-29 LAB — CBC
HCT: 39
Hemoglobin: 13.2
MCHC: 33.9
MCV: 89.2
Platelets: 266
RBC: 4.37
RDW: 13.8
WBC: 5.7

## 2011-08-29 LAB — BASIC METABOLIC PANEL
BUN: 12
CO2: 27
Calcium: 9.4
Chloride: 106
Creatinine, Ser: 0.82
GFR calc Af Amer: >60
GFR calc non Af Amer: >60
Glucose, Bld: 216 — ABNORMAL HIGH
Potassium: 4.1
Sodium: 142

## 2011-12-18 ENCOUNTER — Encounter: Payer: Self-pay | Admitting: Vascular Surgery

## 2011-12-19 ENCOUNTER — Other Ambulatory Visit: Payer: Self-pay

## 2011-12-19 DIAGNOSIS — I70219 Atherosclerosis of native arteries of extremities with intermittent claudication, unspecified extremity: Secondary | ICD-10-CM

## 2011-12-21 ENCOUNTER — Ambulatory Visit: Payer: Self-pay | Admitting: Vascular Surgery

## 2011-12-27 ENCOUNTER — Encounter: Payer: Self-pay | Admitting: Vascular Surgery

## 2011-12-28 ENCOUNTER — Encounter: Payer: Self-pay | Admitting: Vascular Surgery

## 2011-12-28 ENCOUNTER — Encounter (INDEPENDENT_AMBULATORY_CARE_PROVIDER_SITE_OTHER): Payer: Medicare Other | Admitting: *Deleted

## 2011-12-28 ENCOUNTER — Ambulatory Visit (INDEPENDENT_AMBULATORY_CARE_PROVIDER_SITE_OTHER): Payer: Medicare Other | Admitting: Vascular Surgery

## 2011-12-28 VITALS — BP 165/98 | HR 57 | Resp 16 | Ht 63.0 in | Wt 145.0 lb

## 2011-12-28 DIAGNOSIS — I739 Peripheral vascular disease, unspecified: Secondary | ICD-10-CM

## 2011-12-28 DIAGNOSIS — I70219 Atherosclerosis of native arteries of extremities with intermittent claudication, unspecified extremity: Secondary | ICD-10-CM

## 2011-12-28 NOTE — Progress Notes (Signed)
The patient returns for follow-up today. She has known peripheral arterial disease. She previously underwent left superficial femoral artery stenting and her stents and now occluded. She continues to have claudication at approximately 2 blocks. She does not have rest pain or nonhealing ulcers. She continues to smoke about a quarter pack of cigarettes per day.  She underwent aortogram with bilateral lower extremity runoff for left leg claudication on November 04 2010.   This showed a long left superficial femoral artery occlusion. She did have reconstitution of a short segment of the above-  knee popliteal artery right at the knee joint. Of note, she also had aberrant anterior tibial artery takeoff coming right at the level of the  knee joint as well. In light of this patient, anatomically speaking, would possibly be a candidate for a femoral to above-knee popliteal  bypass but more likely would require a below-knee bypass or possibly even an anterior tibial artery bypass. I had a lengthy discussion with  the patient today regarding this and she has had several weeks to think about the possible options of conservative management of smoking  cessation combined with a walking program versus considering a bypass operation for her left leg.   Review of systems: She denies shortness of breath. She denies chest pain.   PHYSICAL EXAMINATION:   Filed Vitals:   12/28/11 1043  BP: 165/98  Pulse: 57  Resp: 16  Height: 5\' 3"  (1.6 m)  Weight: 145 lb (65.772 kg)  SpO2: 100%   Cardiac: Regular rate and rhythm without murmur Chest: Clear to auscultation bilaterally Neck: 2+ carotid pulses without bruit Extremities: 2+ femoral pulses bilaterally with absent popliteal and pedal pulses Skin: No open ulcers or rashes  Data: ABI right 0.89 with biphasic waveforms left 0.64 with monophasic waveforms overall unchanged since 2010  Assessment: Stable two block left lower extremity claudication  Plan: In  discussions with the patient today we talked about durability of bypasses versus limb loss long-term. I discussed with her that the  durability of a below-knee bypass would probably be 75% at 5 years, at which time she would be at risk of limb loss. I also discussed with her  that her risk of limb loss lifelong if she is able to quit smoking and begin a walking program would be less than 5% overall. She has opted at  this point for conservative management with an attempt at smoking cessation and a walking program. She is going to start nicotine patches  soon.We could also revisit in the future a bypass operation if she wishes to do that at some point. The patient was also given a form for a handicapped sticker today because of her walking distance. She will followup with repeat ABIs in 6 months time. She'll continue to try to quit smoking.  Fabienne Bruns, MD [facility] [place] [facility] Office: [phone] Pager: [phone]

## 2012-06-27 ENCOUNTER — Ambulatory Visit: Payer: Medicare Other | Admitting: Vascular Surgery

## 2012-07-04 ENCOUNTER — Ambulatory Visit: Payer: Medicare Other | Admitting: Vascular Surgery

## 2012-07-31 ENCOUNTER — Encounter: Payer: Self-pay | Admitting: Vascular Surgery

## 2012-08-01 ENCOUNTER — Encounter (INDEPENDENT_AMBULATORY_CARE_PROVIDER_SITE_OTHER): Payer: Medicare Other | Admitting: Vascular Surgery

## 2012-08-01 ENCOUNTER — Encounter: Payer: Self-pay | Admitting: Vascular Surgery

## 2012-08-01 ENCOUNTER — Ambulatory Visit (INDEPENDENT_AMBULATORY_CARE_PROVIDER_SITE_OTHER): Payer: Medicare Other | Admitting: Vascular Surgery

## 2012-08-01 VITALS — BP 149/69 | HR 50 | Resp 16 | Ht 63.0 in | Wt 143.0 lb

## 2012-08-01 DIAGNOSIS — I70219 Atherosclerosis of native arteries of extremities with intermittent claudication, unspecified extremity: Secondary | ICD-10-CM | POA: Insufficient documentation

## 2012-08-01 DIAGNOSIS — I739 Peripheral vascular disease, unspecified: Secondary | ICD-10-CM

## 2012-08-01 DIAGNOSIS — Z48812 Encounter for surgical aftercare following surgery on the circulatory system: Secondary | ICD-10-CM

## 2012-08-01 NOTE — Progress Notes (Signed)
The patient is a 66 year old female who returns for follow-up today. She has known peripheral arterial disease. She previously underwent left and right superficial femoral artery stenting and her stents and now occluded. She continues to have claudication at approximately 2 blocks. She does not have rest pain or nonhealing ulcers. She has switched electronic cigarettes. I emphasized to her today that she still needs to cut the nicotine out of this.  She underwent aortogram with bilateral lower extremity runoff for left leg claudication on November 04 2010. This showed a long left superficial femoral artery occlusion. She did have reconstitution of a short segment of the above-  knee popliteal artery right at the knee joint. Of note, she also had aberrant anterior tibial artery takeoff coming right at the level of the knee joint as well. In light of this patient, anatomically speaking, would possibly be a candidate for a femoral to above-knee popliteal  bypass but more likely would require a below-knee bypass or possibly even an anterior tibial artery bypass. 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   Review of systems: She denies shortness of breath. She denies chest pain.    PHYSICAL EXAMINATION:   Filed Vitals:   08/01/12 1502  BP: 149/69  Pulse: 50  Resp: 16  Height: 5\' 3"  (1.6 m)  Weight: 143 lb (64.864 kg)  SpO2: 100%  Cardiac: Regular rate and rhythm without murmur  Chest: Clear to auscultation bilaterally  Neck: 2+ carotid pulses without bruit  Extremities: 2+ femoral pulses bilaterally with absent popliteal and pedal pulses  Skin: No open ulcers or rashes    Data: ABI right 0.74 with biphasic waveforms left 0.49 with monophasic waveforms  Assessment: Stable two block left lower extremity claudication   Plan:  In discussions with the patient today we talked about durability of bypasses versus limb loss long-term. I discussed with her  that her risk of limb  loss lifelong if she is able to quit smoking and begin a walking program would be less than 5% overall. She has opted at  this point for conservative management with an attempt at smoking cessation and a walking program.  She will followup with repeat ABIs in 6 months time. She'll continue to try to quit smoking. She was given a prescription today for Plavix. She was also given a prescription today for Pletal.  Fabienne Bruns, MD  [facility]  Office: [phone]  Pager: [phone]

## 2012-08-02 NOTE — Addendum Note (Signed)
Addended by: Sharee Pimple on: 08/02/2012 09:06 AM   Modules accepted: Orders

## 2012-10-16 ENCOUNTER — Other Ambulatory Visit (HOSPITAL_COMMUNITY)
Admission: RE | Admit: 2012-10-16 | Discharge: 2012-10-16 | Disposition: A | Discharge status: Home / Self Care | Payer: BC Managed Care – PPO | Source: Ambulatory Visit | Attending: Family Medicine | Admitting: Family Medicine

## 2012-10-16 ENCOUNTER — Other Ambulatory Visit: Payer: Self-pay | Admitting: Family Medicine

## 2012-10-16 DIAGNOSIS — Z124 Encounter for screening for malignant neoplasm of cervix: Secondary | ICD-10-CM | POA: Insufficient documentation

## 2012-10-24 ENCOUNTER — Other Ambulatory Visit: Payer: Self-pay | Admitting: Family Medicine

## 2012-10-24 DIAGNOSIS — M542 Cervicalgia: Secondary | ICD-10-CM

## 2012-10-27 ENCOUNTER — Ambulatory Visit
Admission: RE | Admit: 2012-10-27 | Discharge: 2012-10-27 | Disposition: A | Discharge status: Home / Self Care | Payer: BC Managed Care – PPO | Source: Ambulatory Visit | Attending: Family Medicine | Admitting: Family Medicine

## 2012-10-27 DIAGNOSIS — M542 Cervicalgia: Secondary | ICD-10-CM

## 2012-11-01 ENCOUNTER — Ambulatory Visit
Admission: RE | Admit: 2012-11-01 | Discharge: 2012-11-01 | Disposition: A | Discharge status: Home / Self Care | Payer: BC Managed Care – PPO | Source: Ambulatory Visit | Attending: Family Medicine | Admitting: Family Medicine

## 2012-11-07 ENCOUNTER — Telehealth: Payer: Self-pay | Admitting: *Deleted

## 2012-11-07 NOTE — Telephone Encounter (Signed)
Patient called to ask if she could stop her Pletal and Plavix for 1 week so she could have an epidural steroid injection in her neck. This was OK'd by Dr. Darrick Penna and I relayed the information to Jill Higgins. She will call me back if she needs a formal letter sent to the other doctor. She voiced understanding that this would be a temporary change and she will need to start the meds back asap.

## 2013-01-29 ENCOUNTER — Ambulatory Visit: Payer: BC Managed Care – PPO | Admitting: Neurosurgery

## 2013-02-19 ENCOUNTER — Encounter: Payer: Self-pay | Admitting: Vascular Surgery

## 2013-02-20 ENCOUNTER — Ambulatory Visit: Payer: Self-pay | Admitting: Vascular Surgery

## 2013-04-02 ENCOUNTER — Encounter: Payer: Self-pay | Admitting: Vascular Surgery

## 2013-04-03 ENCOUNTER — Encounter: Payer: Self-pay | Admitting: Vascular Surgery

## 2013-04-03 ENCOUNTER — Ambulatory Visit (INDEPENDENT_AMBULATORY_CARE_PROVIDER_SITE_OTHER): Payer: BC Managed Care – PPO | Admitting: Vascular Surgery

## 2013-04-03 ENCOUNTER — Encounter (INDEPENDENT_AMBULATORY_CARE_PROVIDER_SITE_OTHER): Payer: BC Managed Care – PPO | Admitting: *Deleted

## 2013-04-03 VITALS — BP 153/72 | HR 56 | Resp 16 | Ht 63.0 in | Wt 154.0 lb

## 2013-04-03 DIAGNOSIS — M79609 Pain in unspecified limb: Secondary | ICD-10-CM

## 2013-04-03 DIAGNOSIS — I70219 Atherosclerosis of native arteries of extremities with intermittent claudication, unspecified extremity: Secondary | ICD-10-CM

## 2013-04-03 DIAGNOSIS — Z48812 Encounter for surgical aftercare following surgery on the circulatory system: Secondary | ICD-10-CM

## 2013-04-03 DIAGNOSIS — I739 Peripheral vascular disease, unspecified: Secondary | ICD-10-CM

## 2013-04-03 NOTE — Progress Notes (Signed)
The patient is a 67 year old female who returns for follow-up today. She has known peripheral arterial disease. She previously underwent left and right superficial femoral artery stenting and her stents and now occluded. She continues to have claudication at approximately 1-2 blocks. She does not have rest pain or nonhealing ulcers. She has switched electronic cigarettes. I emphasized to her today that she still needs to cut the nicotine out of this.  She underwent aortogram with bilateral lower extremity runoff for left leg claudication on November 04 2010. This showed a long left superficial femoral artery occlusion. She did have reconstitution of a short segment of the above-  knee popliteal artery right at the knee joint. Of note, she also had aberrant anterior tibial artery takeoff coming right at the level of the knee joint as well. In light of this patient, anatomically speaking, would possibly be a candidate for a femoral to above-knee popliteal  bypass but more likely would require a below-knee bypass or possibly even an anterior tibial artery bypass. She denies rest pain.   Review of systems: She denies shortness of breath. She denies chest pain.   Current Outpatient Prescriptions on File Prior to Visit  Medication Sig Dispense Refill  . amLODipine-olmesartan (AZOR) 5-40 MG per tablet Take 1 tablet by mouth daily.      Marland Kitchen aspirin 81 MG tablet Take 160 mg by mouth daily.      Marland Kitchen atenolol (TENORMIN) 50 MG tablet Take 50 mg by mouth daily.      . B Complex-C (B-COMPLEX WITH VITAMIN C) tablet Take 1 tablet by mouth daily.      . cilostazol (PLETAL) 50 MG tablet Take 50 mg by mouth 2 (two) times daily.      . clopidogrel (PLAVIX) 75 MG tablet Take 75 mg by mouth daily.      Marland Kitchen LORazepam (ATIVAN) 0.5 MG tablet Take 0.5 mg by mouth as needed.      . Omeprazole Magnesium (PRILOSEC OTC PO) Take by mouth as needed.      . rosuvastatin (CRESTOR) 10 MG tablet Take 5 mg by mouth daily.      .  sitaGLIPtan-metformin (JANUMET) 50-1000 MG per tablet Take 1 tablet by mouth 2 (two) times daily with a meal.      . temazepam (RESTORIL) 30 MG capsule Take 30 mg by mouth at bedtime as needed.      . calcium & magnesium carbonates (MYLANTA) 311-232 MG per tablet Take 1 tablet by mouth daily.      Marland Kitchen ESZOPICLONE 3 MG tablet Take by mouth daily.       No current facility-administered medications on file prior to visit.      PHYSICAL EXAMINATION:     Filed Vitals:     08/01/12 1502   BP:  149/69   Pulse:  50   Resp:  16   Height:  5\' 3"  (1.6 m)   Weight:  143 lb (64.864 kg)   SpO2:  100%   Cardiac: Regular rate and rhythm without murmur   Chest: Clear to auscultation bilaterally   Neck: 2+ carotid pulses without bruit   Extremities: 2+ femoral pulses bilaterally with absent popliteal and pedal pulses   Skin: No open ulcers or rashes    Data: The patient had bilateral ABIs today which I reviewed and interpreted. These are unchanged from 2009. ABI today was 0.69 on the right 0.53 on the left  Assessment: Stable 1-2 block left lower extremity claudication  Plan:   In discussions with the patient today we talked about durability of bypasses versus limb loss long-term. I discussed with her   that her risk of limb loss lifelong if she is able to quit smoking and begin a walking program would be less than 5% overall. She has opted at   this point for conservative management with an attempt at smoking cessation and a walking program.  However at this point she is considering bypass. She will followup with repeat ABIs in 6 months time. She assures me that her current electronic cigarettes his nicotine free.  She has stopped her Pletal but she did not feel she reached any benefit from this. She is currently on aspirin. She wishes to defer any intervention until August of 2014. I will see her back at that time with repeat ABIs for consideration as to whether or not she would like to undergo  bypass.  Fabienne Bruns, MD   [facility]   Office: [phone]   Pager: [phone]

## 2013-04-04 NOTE — Addendum Note (Signed)
Addended by: Sharee Pimple on: 04/04/2013 09:04 AM   Modules accepted: Orders

## 2013-07-04 ENCOUNTER — Other Ambulatory Visit: Payer: Self-pay

## 2013-07-04 DIAGNOSIS — Z1231 Encounter for screening mammogram for malignant neoplasm of breast: Secondary | ICD-10-CM

## 2013-07-23 ENCOUNTER — Ambulatory Visit: Payer: BC Managed Care – PPO

## 2013-07-30 ENCOUNTER — Ambulatory Visit
Admission: RE | Admit: 2013-07-30 | Discharge: 2013-07-30 | Disposition: A | Discharge status: Home / Self Care | Payer: BC Managed Care – PPO | Source: Ambulatory Visit

## 2013-07-30 DIAGNOSIS — Z1231 Encounter for screening mammogram for malignant neoplasm of breast: Secondary | ICD-10-CM

## 2013-08-06 ENCOUNTER — Encounter: Payer: Self-pay | Admitting: Vascular Surgery

## 2013-08-07 ENCOUNTER — Encounter: Payer: Self-pay | Admitting: *Deleted

## 2013-08-07 ENCOUNTER — Ambulatory Visit (INDEPENDENT_AMBULATORY_CARE_PROVIDER_SITE_OTHER): Payer: BC Managed Care – PPO | Admitting: Vascular Surgery

## 2013-08-07 ENCOUNTER — Encounter (INDEPENDENT_AMBULATORY_CARE_PROVIDER_SITE_OTHER): Payer: BC Managed Care – PPO | Admitting: *Deleted

## 2013-08-07 ENCOUNTER — Other Ambulatory Visit: Payer: Self-pay | Admitting: *Deleted

## 2013-08-07 ENCOUNTER — Encounter: Payer: Self-pay | Admitting: Vascular Surgery

## 2013-08-07 VITALS — BP 142/65 | HR 59 | Ht 63.0 in | Wt 152.3 lb

## 2013-08-07 DIAGNOSIS — I739 Peripheral vascular disease, unspecified: Secondary | ICD-10-CM

## 2013-08-07 DIAGNOSIS — I70219 Atherosclerosis of native arteries of extremities with intermittent claudication, unspecified extremity: Secondary | ICD-10-CM

## 2013-08-07 DIAGNOSIS — Z48812 Encounter for surgical aftercare following surgery on the circulatory system: Secondary | ICD-10-CM

## 2013-08-07 NOTE — Progress Notes (Signed)
[facility] HISTORY AND PHYSICAL   CC: worsening pain in legs with walking - now life-limiting  History of Present Illness: Jill Higgins is a 67 y.o. female with hx DM, HTN and known PVD who has been having worsening claudication in both legs. Pt has had SFA PTA and stents which have occluded. She states the left leg is worse than the right.  She can walk only about 60 steps before she has pain. She also C/O pain in the legs with steps but denies night or rest pain. 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 She does have peripheral neuropathy with tingling in her feet. She has tried conservative management for several years and feels that at this point she wishes to proceed with bypass. She has previously been informed of the risks benefits and possible complications of a bypass procedure.  Repeat ABI's are essentially unchanged   Past Medical History  Diagnosis Date  . Substance abuse     Tobacco abuse  . Hypertension   . Hyperlipidemia   . Peripheral vascular disease   . Fibromyalgia   . GERD (gastroesophageal reflux disease)   . Depression with anxiety   . Diabetes mellitus     Type II    ROS: [x]  Positive   [ ]  Denies    General: [ ]  Weight loss, [ ]  Fever, [ ]  chills Neurologic: [ ]  Dizziness, [ ]  Blackouts, [ ]  Seizure [ ]  Stroke, [ ]  "Mini stroke", [ ]  Slurred speech, [ ]  Temporary blindness; [ ]  weakness in arms or legs, [ ]  Hoarseness Cardiac: [ ]  Chest pain/pressure, [ ]  Shortness of breath at rest [ ]  Shortness of breath with exertion, [ ]  Atrial fibrillation or irregular heartbeat Vascular: [x ] Pain in legs with walking, [ ]  Pain in legs at rest, [ ]  Pain in legs at night,  [ ]  Non-healing ulcer, [ ]  Blood clot in vein/DVT,   Pulmonary: [ ]  Home oxygen, [ ]  Productive cough, [ ]  Coughing up blood, [ ]  Asthma,  [ ]  Wheezing Musculoskeletal:  [x ] Arthritis, [ ]   Low back pain, [ ]  Joint pain Hematologic: [ ]  Easy Bruising, [ ]  Anemia; [ ]  Hepatitis Gastrointestinal: [ ]  Blood in stool, [ ]  Gastroesophageal Reflux/heartburn, [ ]  Trouble swallowing Urinary: [ ]  chronic Kidney disease, [ ]  on HD - [ ]  MWF or [ ]  TTHS, [ ]  Burning with urination, [ ]  Difficulty urinating Skin: [ ]  Rashes, [ ]  Wounds Psychological: [ ]  Anxiety, [ ]  Depression   Social History History  Substance Use Topics  . Smoking status: Light Tobacco Smoker    Types: Cigarettes  . Smokeless tobacco: Never Used     Comment: pt states that she is using the E cig only smokes 1-20 regular cigs per month  . Alcohol Use: No    Family History Family History  Problem Relation Age of Onset  . Heart disease Father   . Heart attack Father   . Hypertension Mother   . Alzheimer's disease Mother   . Hypertension Sister   . Diabetes Brother   . Hyperlipidemia Brother   . Hypertension Brother     No Known Allergies  Current Outpatient Prescriptions  Medication Sig Dispense Refill  . amLODipine-olmesartan (AZOR) 5-40 MG per tablet Take 1 tablet by mouth daily.      Marland Kitchen aspirin 81  MG tablet Take 160 mg by mouth daily.      Marland Kitchen atenolol (TENORMIN) 50 MG tablet Take 50 mg by mouth daily.      . B Complex-C (B-COMPLEX WITH VITAMIN C) tablet Take 1 tablet by mouth daily.      . calcium & magnesium carbonates (MYLANTA) 311-232 MG per tablet Take 1 tablet by mouth daily.      . clopidogrel (PLAVIX) 75 MG tablet Take 75 mg by mouth daily.      Marland Kitchen lidocaine-prilocaine (EMLA) cream as needed.      Marland Kitchen LORazepam (ATIVAN) 0.5 MG tablet Take 0.5 mg by mouth as needed.      . rosuvastatin (CRESTOR) 10 MG tablet Take 5 mg by mouth daily.      . sitaGLIPtan-metformin (JANUMET) 50-1000 MG per tablet Take 1 tablet by mouth 2 (two) times daily with a meal.      . temazepam (RESTORIL) 30 MG capsule Take 30 mg by mouth at bedtime as needed.      . TRAVATAN Z 0.004 % SOLN ophthalmic solution at bedtime.       . cilostazol (PLETAL) 50 MG tablet Take 50 mg by mouth 2 (two) times daily.      Marland Kitchen ESZOPICLONE 3 MG tablet Take by mouth daily.      . Omeprazole Magnesium (PRILOSEC OTC PO) Take by mouth as needed.       No current facility-administered medications for this visit.    Physical Examination  Filed Vitals:   08/07/13 1313  BP: 142/65  Pulse: 59    Body mass index is 26.99 kg/(m^2).  General:  WDWN in NAD Gait: Normal HENT: WNL Eyes: Pupils equal Pulmonary: normal non-labored breathing , without Rales, rhonchi,  wheezing Cardiac: RRR, without  Murmurs, rubs or gallops; No carotid bruits Abdomen: soft, NT, no masses Skin: no rashes, ulcers noted Vascular Exam/Pulses: 2+ palpable Femoral pulses Monophasic doppler signal in bilateral DP and PT  Extremities without ischemic changes, no Gangrene , no cellulitis; no open wounds;  Musculoskeletal: no muscle wasting or atrophy  Neurologic: A&O X 3; Appropriate Affect ; SENSATION: normal; MOTOR FUNCTION:  moving all extremities equally. Speech is fluent/normal  Non-Invasive Vascular Imaging: 08/07/2013 ABI's Right 0.69  Left 0.53  I reviewed and interpreted the patient's study  ASSESSMENT: Jill Higgins is a 67 y.o. female With worsening life-limiting claudication left > right. Previous intervention of bilateral SFA stents which are now occluded PLAN:  Aortogram and bilateral runoff. Depending on study results, further interventions will be planned with the pt. After aortogram  Clinic MD: CEF Tamina Cyphers J 08/07/2013 2:44 PM   History and exam details as above. The patient has a chronic lower extremity claudication which has been progressively worse over the last several years. This point she wishes intervention for lifestyle limiting claudication. Arteriogram is scheduled for 08/15/2013. Risks benefits possible complications and procedure details were trying to the patient today. She also understands that the arteriogram would  be for diagnostic purposes for planning of an operation if she is not a candidate for percutaneous intervention.  Fabienne Bruns, MD [facility] Office: [phone] Pager: [phone]

## 2013-08-08 ENCOUNTER — Encounter (HOSPITAL_COMMUNITY): Payer: Self-pay | Admitting: Pharmacy Technician

## 2013-08-14 MED ORDER — SODIUM CHLORIDE 0.9 % IV SOLN
INTRAVENOUS | Status: DC
  Administered 2013-08-15: 09:00:00 via INTRAVENOUS

## 2013-08-15 ENCOUNTER — Encounter (HOSPITAL_COMMUNITY)
Admission: RE | Disposition: A | Discharge status: Home / Self Care | Payer: Self-pay | Source: Ambulatory Visit | Attending: Vascular Surgery

## 2013-08-15 ENCOUNTER — Ambulatory Visit (HOSPITAL_COMMUNITY)
Admission: RE | Admit: 2013-08-15 | Discharge: 2013-08-15 | Disposition: A | Discharge status: Home / Self Care | Payer: BC Managed Care – PPO | Source: Ambulatory Visit | Attending: Vascular Surgery | Admitting: Vascular Surgery

## 2013-08-15 ENCOUNTER — Other Ambulatory Visit: Payer: Self-pay | Admitting: *Deleted

## 2013-08-15 DIAGNOSIS — I10 Essential (primary) hypertension: Secondary | ICD-10-CM | POA: Insufficient documentation

## 2013-08-15 DIAGNOSIS — I739 Peripheral vascular disease, unspecified: Secondary | ICD-10-CM

## 2013-08-15 DIAGNOSIS — Z79899 Other long term (current) drug therapy: Secondary | ICD-10-CM | POA: Insufficient documentation

## 2013-08-15 DIAGNOSIS — Z01810 Encounter for preprocedural cardiovascular examination: Secondary | ICD-10-CM

## 2013-08-15 DIAGNOSIS — E119 Type 2 diabetes mellitus without complications: Secondary | ICD-10-CM | POA: Insufficient documentation

## 2013-08-15 DIAGNOSIS — G5790 Unspecified mononeuropathy of unspecified lower limb: Secondary | ICD-10-CM | POA: Insufficient documentation

## 2013-08-15 DIAGNOSIS — I70219 Atherosclerosis of native arteries of extremities with intermittent claudication, unspecified extremity: Secondary | ICD-10-CM

## 2013-08-15 HISTORY — PX: ABDOMINAL AORTAGRAM: SHX5454

## 2013-08-15 LAB — POCT I-STAT, CHEM 8
BUN: 20 mg/dL (ref 6–23)
Calcium, Ion: 1.22 mmol/L (ref 1.13–1.30)
Chloride: 109 mEq/L (ref 96–112)
Creatinine, Ser: 0.7 mg/dL (ref 0.50–1.10)
Glucose, Bld: 126 mg/dL — ABNORMAL HIGH (ref 70–99)
HCT: 36 % (ref 36.0–46.0)
Hemoglobin: 12.2 g/dL (ref 12.0–15.0)
Potassium: 4.1 mEq/L (ref 3.5–5.1)
Sodium: 144 mEq/L (ref 135–145)
TCO2: 24 mmol/L (ref 0–100)

## 2013-08-15 LAB — GLUCOSE, CAPILLARY: Glucose-Capillary: 112 mg/dL — ABNORMAL HIGH (ref 70–99)

## 2013-08-15 SURGERY — ABDOMINAL AORTAGRAM
Anesthesia: LOCAL

## 2013-08-15 MED ORDER — ONDANSETRON HCL 4 MG/2ML IJ SOLN: 4.0000 mg | Freq: Four times a day (QID) | INTRAMUSCULAR | Status: DC | PRN

## 2013-08-15 MED ORDER — MORPHINE SULFATE 2 MG/ML IJ SOLN
INTRAMUSCULAR | Status: AC
  Filled 2013-08-15: qty 1

## 2013-08-15 MED ORDER — METOPROLOL TARTRATE 1 MG/ML IV SOLN: 2.0000 mg | INTRAVENOUS | Status: DC | PRN

## 2013-08-15 MED ORDER — LABETALOL HCL 5 MG/ML IV SOLN: 10.0000 mg | INTRAVENOUS | Status: DC | PRN

## 2013-08-15 MED ORDER — MORPHINE SULFATE 10 MG/ML IJ SOLN
2.0000 mg | INTRAMUSCULAR | Status: DC | PRN
  Administered 2013-08-15: 2 mg via INTRAVENOUS

## 2013-08-15 MED ORDER — HYDRALAZINE HCL 20 MG/ML IJ SOLN: 10.0000 mg | INTRAMUSCULAR | Status: DC | PRN

## 2013-08-15 MED ORDER — OXYCODONE HCL 5 MG PO TABS: 5.0000 mg | ORAL_TABLET | ORAL | Status: DC | PRN

## 2013-08-15 MED ORDER — LIDOCAINE HCL (PF) 1 % IJ SOLN
INTRAMUSCULAR | Status: AC
  Filled 2013-08-15: qty 30

## 2013-08-15 MED ORDER — ACETAMINOPHEN 325 MG PO TABS: 325.0000 mg | ORAL_TABLET | ORAL | Status: DC | PRN

## 2013-08-15 MED ORDER — HEPARIN (PORCINE) IN NACL 2-0.9 UNIT/ML-% IJ SOLN
INTRAMUSCULAR | Status: AC
  Filled 2013-08-15: qty 1000

## 2013-08-15 MED ORDER — ACETAMINOPHEN 325 MG RE SUPP: 325.0000 mg | RECTAL | Status: DC | PRN

## 2013-08-15 MED ORDER — SODIUM CHLORIDE 0.45 % IV SOLN
INTRAVENOUS | Status: DC
  Administered 2013-08-15: 11:00:00 via INTRAVENOUS

## 2013-08-15 NOTE — Progress Notes (Signed)
Pt able to void 400cc of yellow urine without any problems

## 2013-08-15 NOTE — H&P (View-Only) (Signed)
[facility] HISTORY AND PHYSICAL   CC: worsening pain in legs with walking - now life-limiting  History of Present Illness: Jill Higgins is a 67 y.o. female with hx DM, HTN and known PVD who has been having worsening claudication in both legs. Pt has had SFA PTA and stents which have occluded. She states the left leg is worse than the right.  She can walk only about 60 steps before she has pain. She also C/O pain in the legs with steps but denies night or rest pain. 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 She does have peripheral neuropathy with tingling in her feet. She has tried conservative management for several years and feels that at this point she wishes to proceed with bypass. She has previously been informed of the risks benefits and possible complications of a bypass procedure.  Repeat ABI's are essentially unchanged   Past Medical History  Diagnosis Date  . Substance abuse     Tobacco abuse  . Hypertension   . Hyperlipidemia   . Peripheral vascular disease   . Fibromyalgia   . GERD (gastroesophageal reflux disease)   . Depression with anxiety   . Diabetes mellitus     Type II    ROS: [x] Positive   [ ] Denies    General: [ ] Weight loss, [ ] Fever, [ ] chills Neurologic: [ ] Dizziness, [ ] Blackouts, [ ] Seizure [ ] Stroke, [ ] "Mini stroke", [ ] Slurred speech, [ ] Temporary blindness; [ ] weakness in arms or legs, [ ] Hoarseness Cardiac: [ ] Chest pain/pressure, [ ] Shortness of breath at rest [ ] Shortness of breath with exertion, [ ] Atrial fibrillation or irregular heartbeat Vascular: [x ] Pain in legs with walking, [ ] Pain in legs at rest, [ ] Pain in legs at night,  [ ] Non-healing ulcer, [ ] Blood clot in vein/DVT,   Pulmonary: [ ] Home oxygen, [ ] Productive cough, [ ] Coughing up blood, [ ] Asthma,  [ ] Wheezing Musculoskeletal:  [x ] Arthritis, [ ]  Low back pain, [ ] Joint pain Hematologic: [ ] Easy Bruising, [ ] Anemia; [ ] Hepatitis Gastrointestinal: [ ] Blood in stool, [ ] Gastroesophageal Reflux/heartburn, [ ] Trouble swallowing Urinary: [ ] chronic Kidney disease, [ ] on HD - [ ] MWF or [ ] TTHS, [ ] Burning with urination, [ ] Difficulty urinating Skin: [ ] Rashes, [ ] Wounds Psychological: [ ] Anxiety, [ ] Depression   Social History History  Substance Use Topics  . Smoking status: Light Tobacco Smoker    Types: Cigarettes  . Smokeless tobacco: Never Used     Comment: pt states that she is using the E cig only smokes 1-20 regular cigs per month  . Alcohol Use: No    Family History Family History  Problem Relation Age of Onset  . Heart disease Father   . Heart attack Father   . Hypertension Mother   . Alzheimer's disease Mother   . Hypertension Sister   . Diabetes Brother   . Hyperlipidemia Brother   . Hypertension Brother     No Known Allergies  Current Outpatient Prescriptions  Medication Sig Dispense Refill  . amLODipine-olmesartan (AZOR) 5-40 MG per tablet Take 1 tablet by mouth daily.      . aspirin 81   MG tablet Take 160 mg by mouth daily.      . atenolol (TENORMIN) 50 MG tablet Take 50 mg by mouth daily.      . B Complex-C (B-COMPLEX WITH VITAMIN C) tablet Take 1 tablet by mouth daily.      . calcium & magnesium carbonates (MYLANTA) 311-232 MG per tablet Take 1 tablet by mouth daily.      . clopidogrel (PLAVIX) 75 MG tablet Take 75 mg by mouth daily.      . lidocaine-prilocaine (EMLA) cream as needed.      . LORazepam (ATIVAN) 0.5 MG tablet Take 0.5 mg by mouth as needed.      . rosuvastatin (CRESTOR) 10 MG tablet Take 5 mg by mouth daily.      . sitaGLIPtan-metformin (JANUMET) 50-1000 MG per tablet Take 1 tablet by mouth 2 (two) times daily with a meal.      . temazepam (RESTORIL) 30 MG capsule Take 30 mg by mouth at bedtime as needed.      . TRAVATAN Z 0.004 % SOLN ophthalmic solution at bedtime.       . cilostazol (PLETAL) 50 MG tablet Take 50 mg by mouth 2 (two) times daily.      . ESZOPICLONE 3 MG tablet Take by mouth daily.      . Omeprazole Magnesium (PRILOSEC OTC PO) Take by mouth as needed.       No current facility-administered medications for this visit.    Physical Examination  Filed Vitals:   08/07/13 1313  BP: 142/65  Pulse: 59    Body mass index is 26.99 kg/(m^2).  General:  WDWN in NAD Gait: Normal HENT: WNL Eyes: Pupils equal Pulmonary: normal non-labored breathing , without Rales, rhonchi,  wheezing Cardiac: RRR, without  Murmurs, rubs or gallops; No carotid bruits Abdomen: soft, NT, no masses Skin: no rashes, ulcers noted Vascular Exam/Pulses: 2+ palpable Femoral pulses Monophasic doppler signal in bilateral DP and PT  Extremities without ischemic changes, no Gangrene , no cellulitis; no open wounds;  Musculoskeletal: no muscle wasting or atrophy  Neurologic: A&O X 3; Appropriate Affect ; SENSATION: normal; MOTOR FUNCTION:  moving all extremities equally. Speech is fluent/normal  Non-Invasive Vascular Imaging: 08/07/2013 ABI's Right 0.69  Left 0.53  I reviewed and interpreted the patient's study  ASSESSMENT: Jill Higgins is a 67 y.o. female With worsening life-limiting claudication left > right. Previous intervention of bilateral SFA stents which are now occluded PLAN:  Aortogram and bilateral runoff. Depending on study results, further interventions will be planned with the pt. After aortogram  Clinic MD: CEF ROCZNIAK,REGINA J 08/07/2013 2:44 PM   History and exam details as above. The patient has a chronic lower extremity claudication which has been progressively worse over the last several years. This point she wishes intervention for lifestyle limiting claudication. Arteriogram is scheduled for 08/15/2013. Risks benefits possible complications and procedure details were trying to the patient today. She also understands that the arteriogram would  be for diagnostic purposes for planning of an operation if she is not a candidate for percutaneous intervention.  Jill Asencio, MD [facility] Office: [phone] Pager: [phone]  

## 2013-08-15 NOTE — Interval H&P Note (Signed)
History and Physical Interval Note:  08/15/2013 7:37 AM  Jill Higgins  has presented today for surgery, with the diagnosis of PVD  The various methods of treatment have been discussed with the patient and family. After consideration of risks, benefits and other options for treatment, the patient has consented to  Procedure(s): ABDOMINAL AORTAGRAM (N/A) as a surgical intervention .  The patient's history has been reviewed, patient examined, no change in status, stable for surgery.  I have reviewed the patient's chart and labs.  Questions were answered to the patient's satisfaction.     Prashant Glosser E

## 2013-08-15 NOTE — Op Note (Signed)
Procedure: Aortogram with bilateral lower extremity runoff  Preoperative diagnosis: Claudication  Post operative diagnosis: Same  Anesthesia: Local  Operative details: After obtaining informed consent, the patient was taken to the PV lab. The patient was placed in supine position on the Angio table. Both groins were prepped and draped in the usual sterile fashion. Local anesthesia was infiltrated over the right common femoral artery. The ultrasound was used to identify the right common femoral artery. An introducer needle was then used to cannulate the right common femoral artery without difficulty. An 035 versacore wire was placed through the needle up into the abdominal aorta. A 5 French sheath was then placed over the guidewire into the right common femoral artery. This was thoroughly flushed with heparinized saline. A 5 French pigtail catheter was then placed over the guidewire up into the abdominal aorta. Abdominal aortogram was obtained. The infrarenal abdominal aorta is patent. The left and right renal arteries are patent. The external and internal iliac arteries are widely patent.  Next the pigtail catheter was pulled down to the aortic bifurcation and lower extremity runoff views were obtained.  In the left lower extremity, the left common femoral artery is patent. The left profunda is patent. The left superficial femoral artery is occluded at its origin. The above-knee popliteal artery does reconstitute just above the knee joint. There is a very high take off of the anterior tibial artery also above the knee joint. The below-knee popliteal artery is patent. The anterior tibial artery is in continuity all the way to level of the foot. The peroneal artery is patent but diffusely diseased and small. The posterior tibial artery is occluded.  In the right lower extremity, the right common femoral artery is patent. The right profunda femoris artery is patent. There is a high-grade stenosis of one  of the first profunda branches of 80%. The right superficial femoral artery is occluded. There is reconstitution of the below-knee popliteal artery with three-vessel runoff to the right foot. The primary runoff vessel is the anterior tibial artery. However it is severely diseased over its proximal third. It is in continuity.  At this point the 5 French pigtail catheter was removed over a guidewire. The 5 French sheath was left in place to be pulled in the holding area. The patient tolerated the procedure well and there were no complications. The patient was taken to the holding area in stable condition.  Operative management: The patient will be scheduled for a left femoral to above-knee popliteal bypass in November. Prior to this she will undergo lower extremity vein mapping as well as cardiac risk stratification.  Fabienne Bruns, MD [facility] Office: [phone] Pager: [phone]

## 2013-08-18 ENCOUNTER — Other Ambulatory Visit: Payer: Self-pay | Admitting: *Deleted

## 2013-08-18 DIAGNOSIS — I739 Peripheral vascular disease, unspecified: Secondary | ICD-10-CM

## 2013-08-18 DIAGNOSIS — Z01810 Encounter for preprocedural cardiovascular examination: Secondary | ICD-10-CM

## 2013-08-21 ENCOUNTER — Other Ambulatory Visit: Payer: Self-pay | Admitting: *Deleted

## 2013-08-21 ENCOUNTER — Encounter: Payer: Self-pay | Admitting: *Deleted

## 2013-09-05 ENCOUNTER — Encounter: Payer: Self-pay | Admitting: Cardiology

## 2013-09-05 DIAGNOSIS — K219 Gastro-esophageal reflux disease without esophagitis: Secondary | ICD-10-CM | POA: Insufficient documentation

## 2013-09-05 DIAGNOSIS — I10 Essential (primary) hypertension: Secondary | ICD-10-CM | POA: Insufficient documentation

## 2013-09-05 DIAGNOSIS — E785 Hyperlipidemia, unspecified: Secondary | ICD-10-CM | POA: Insufficient documentation

## 2013-09-05 DIAGNOSIS — I739 Peripheral vascular disease, unspecified: Secondary | ICD-10-CM | POA: Insufficient documentation

## 2013-09-05 DIAGNOSIS — M797 Fibromyalgia: Secondary | ICD-10-CM | POA: Insufficient documentation

## 2013-09-05 DIAGNOSIS — F418 Other specified anxiety disorders: Secondary | ICD-10-CM | POA: Insufficient documentation

## 2013-09-08 ENCOUNTER — Ambulatory Visit (INDEPENDENT_AMBULATORY_CARE_PROVIDER_SITE_OTHER): Payer: BC Managed Care – PPO | Admitting: Cardiology

## 2013-09-08 ENCOUNTER — Encounter: Payer: Self-pay | Admitting: Cardiology

## 2013-09-08 VITALS — BP 116/60 | HR 60 | Ht 62.0 in | Wt 151.0 lb

## 2013-09-08 DIAGNOSIS — I70219 Atherosclerosis of native arteries of extremities with intermittent claudication, unspecified extremity: Secondary | ICD-10-CM

## 2013-09-08 DIAGNOSIS — Z01810 Encounter for preprocedural cardiovascular examination: Secondary | ICD-10-CM

## 2013-09-08 DIAGNOSIS — E785 Hyperlipidemia, unspecified: Secondary | ICD-10-CM

## 2013-09-08 DIAGNOSIS — Z01818 Encounter for other preprocedural examination: Secondary | ICD-10-CM

## 2013-09-08 DIAGNOSIS — I10 Essential (primary) hypertension: Secondary | ICD-10-CM

## 2013-09-08 NOTE — Assessment & Plan Note (Signed)
Plan lexiscan Myoview preoperatively.

## 2013-09-08 NOTE — Assessment & Plan Note (Signed)
Continue aspirin and statin. Per vascular surgery.

## 2013-09-08 NOTE — Assessment & Plan Note (Signed)
Continue present blood pressure medications. 

## 2013-09-08 NOTE — Progress Notes (Signed)
HPI: 67 year old female for preoperative evaluation prior to left femoral to above-the-knee popliteal bypass. She does not have dyspnea on exertion but is limited in activities by claudication. No orthopnea, PND, pedal edema, chest pain or syncope. She can walk approximately 70 feet but then has to stop because of bilateral lower extremity claudication left greater than right.   Current Outpatient Prescriptions  Medication Sig Dispense Refill  . amLODipine-olmesartan (AZOR) 5-40 MG per tablet Take 1 tablet by mouth daily.      Marland Kitchen aspirin 81 MG tablet Take 160 mg by mouth daily.      Marland Kitchen atenolol (TENORMIN) 50 MG tablet Take 50 mg by mouth daily.      . B Complex-C (B-COMPLEX WITH VITAMIN C) tablet Take 1 tablet by mouth daily.      . chlorpheniramine-HYDROcodone (TUSSIONEX) 10-8 MG/5ML LQCR as directed.      . lidocaine-prilocaine (EMLA) cream Apply 1 application topically daily as needed. Rash      . LORazepam (ATIVAN) 0.5 MG tablet Take 0.5 mg by mouth as needed.      . Omeprazole Magnesium (PRILOSEC OTC PO) Take by mouth as needed.      Marland Kitchen PROAIR HFA 108 (90 BASE) MCG/ACT inhaler as directed.      . rosuvastatin (CRESTOR) 10 MG tablet Take 5 mg by mouth daily.      . sitaGLIPtan-metformin (JANUMET) 50-1000 MG per tablet Take 1 tablet by mouth 2 (two) times daily with a meal.      . temazepam (RESTORIL) 30 MG capsule Take 30 mg by mouth at bedtime as needed for sleep.       . TRAVATAN Z 0.004 % SOLN ophthalmic solution Place 1 drop into both eyes at bedtime.        No current facility-administered medications for this visit.    No Known Allergies  Past Medical History  Diagnosis Date  . Tobacco abuse   . Hypertension   . Hyperlipidemia   . Peripheral vascular disease   . Fibromyalgia   . GERD (gastroesophageal reflux disease)   . Depression with anxiety   . Diabetes mellitus     Type II    Past Surgical History  Procedure Laterality Date  . Aortogram w/ pta  05/14/08,   11-04-10    Bilateral aortogram w/ bilateral  SFA PTA  stenting   . Hand surgery Left 2010  . Tonsillectomy      History   Social History  . Marital Status: Single    Spouse Name: N/A    Number of Children: 1  . Years of Education: N/A   Occupational History  .  [employer]   Social History Main Topics  . Smoking status: Current Every Day Smoker    Types: Cigarettes  . Smokeless tobacco: Never Used     Comment: pt states that she is using the E cig only smokes 1-20 regular cigs per month  . Alcohol Use: 0 - .6 oz/week    0-1 Glasses of wine per week     Comment: Occasional  . Drug Use: No  . Sexual Activity: Not on file   Other Topics Concern  . Not on file   Social History Narrative  . No narrative on file    Family History  Problem Relation Age of Onset  . Heart disease Father   . Heart attack Father     MI at age 32  . Hypertension Mother   . Alzheimer's disease Mother   .  Hypertension Sister   . Diabetes Brother   . Hyperlipidemia Brother   . Hypertension Brother     ROS: recent chest congestion and nonproductive cough but no fevers or chills, hemoptysis, dysphasia, odynophagia, melena, hematochezia, dysuria, hematuria, rash, seizure activity, orthopnea, PND, pedal edema. Remaining systems are negative.  Physical Exam:   Blood pressure 116/60, pulse 60, height 5\' 2"  (1.575 m), weight 151 lb (68.493 kg), SpO2 94.00%.  General:  Well developed/well nourished in NAD Skin warm/dry Patient not depressed No peripheral clubbing Back-normal HEENT-normal/normal eyelids Neck supple/normal carotid upstroke bilaterally; no bruits; no JVD; no thyromegaly chest - CTA/ normal expansion CV - RRR/normal S1 and S2; no murmurs, rubs or gallops;  PMI nondisplaced Abdomen -NT/ND, no HSM, no mass, + bowel sounds, no bruit 1+ femoral pulses, no bruits Ext-no edema, chords; distal pulses not palpable. Neuro-grossly nonfocal  ECG Sept 25, 2014 - sinus bradycardia, no ST  changes

## 2013-09-08 NOTE — Patient Instructions (Signed)
Your physician recommends that you schedule a follow-up appointment in: AS NEEDED  Your physician has requested that you have a lexiscan myoview. For further information please visit [email or web address]. Please follow instruction sheet, as given.    

## 2013-09-08 NOTE — Assessment & Plan Note (Signed)
Continue statin. 

## 2013-09-17 ENCOUNTER — Encounter: Payer: Self-pay | Admitting: Vascular Surgery

## 2013-09-18 ENCOUNTER — Encounter (HOSPITAL_COMMUNITY): Payer: BC Managed Care – PPO

## 2013-09-18 ENCOUNTER — Encounter: Payer: Self-pay | Admitting: Vascular Surgery

## 2013-09-18 ENCOUNTER — Ambulatory Visit (INDEPENDENT_AMBULATORY_CARE_PROVIDER_SITE_OTHER): Payer: BC Managed Care – PPO | Admitting: Vascular Surgery

## 2013-09-18 ENCOUNTER — Ambulatory Visit: Payer: BC Managed Care – PPO | Admitting: Vascular Surgery

## 2013-09-18 ENCOUNTER — Ambulatory Visit (HOSPITAL_COMMUNITY)
Admission: RE | Admit: 2013-09-18 | Discharge: 2013-09-18 | Disposition: A | Discharge status: Home / Self Care | Payer: BC Managed Care – PPO | Source: Ambulatory Visit | Attending: Vascular Surgery | Admitting: Vascular Surgery

## 2013-09-18 VITALS — BP 146/72 | HR 68 | Resp 18 | Ht 63.0 in | Wt 146.6 lb

## 2013-09-18 DIAGNOSIS — I739 Peripheral vascular disease, unspecified: Secondary | ICD-10-CM | POA: Insufficient documentation

## 2013-09-18 DIAGNOSIS — Z01810 Encounter for preprocedural cardiovascular examination: Secondary | ICD-10-CM | POA: Insufficient documentation

## 2013-09-18 NOTE — Progress Notes (Signed)
[facility] [place] HISTORY AND PHYSICAL   CC:  Pain in legs with walking   Jill Higgins, Jill Higgins, *  HPI: This is a 67 y.o. female who has been having worsening claudication in both legs.  She has had SFA PTA and stents that have occluded.  Her left leg is worse than her right leg.  She does have peripheral neuropathy with tingling in her feet.  She has tried conservative management for a lengthy time and now wants to proceed with an intervention.  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  She did undergo an aortogram 08/15/13, which revealed the left CFA is patent as well as the profunda.  The SFA is occluded at its origin  And reconstitues just above the knee joint.  There is a very high take off of the anterior tibial artery also above the knee joint.  The BK popliteal is patent, but diffusely diseased and small.  The posterior tibial artery is occluded.  Her right CFA and profunda is patent.  There is a high grade stenosis of one of the first profunda branches of 80%.  The right SFA is occluded and there is reconstitution of the BK popliteal artery with three vessel runoff to the right foot.  The primary runoff vessel is the anterior tibial artery.  She has recently been diagnosed with bronchitis and is taking prednisone and inhalers.  She has had a steroid shot and has finished her course of ABx.  Past Medical History  Diagnosis Date  . Tobacco abuse   . Hypertension   . Hyperlipidemia   . Peripheral vascular disease   . Fibromyalgia   . GERD (gastroesophageal reflux disease)   . Depression with anxiety   . Diabetes mellitus     Type II   Past Surgical History  Procedure Laterality Date  . Aortogram w/ pta  05/14/08,  11-04-10    Bilateral aortogram w/ bilateral  SFA PTA  stenting   . Hand surgery Left 2010  . Tonsillectomy      No Known Allergies  Current Outpatient Prescriptions  Medication Sig Dispense Refill  .  amLODipine-olmesartan (AZOR) 5-40 MG per tablet Take 1 tablet by mouth daily.      Marland Kitchen aspirin 81 MG tablet Take 160 mg by mouth daily.      Marland Kitchen atenolol (TENORMIN) 50 MG tablet Take 50 mg by mouth daily.      . B Complex-C (B-COMPLEX WITH VITAMIN C) tablet Take 1 tablet by mouth daily.      Marland Kitchen lidocaine-prilocaine (EMLA) cream Apply 1 application topically daily as needed. Rash      . LORazepam (ATIVAN) 0.5 MG tablet Take 0.5 mg by mouth as needed.      . Omeprazole Magnesium (PRILOSEC OTC PO) Take by mouth as needed.      Marland Kitchen PROAIR HFA 108 (90 BASE) MCG/ACT inhaler as directed.      . rosuvastatin (CRESTOR) 10 MG tablet Take 5 mg by mouth daily.      . sitaGLIPtan-metformin (JANUMET) 50-1000 MG per tablet Take 1 tablet by mouth 2 (two) times daily with a meal.      . temazepam (RESTORIL) 30 MG capsule Take 30 mg by mouth at bedtime as needed for sleep.       . TRAVATAN Z 0.004 % SOLN ophthalmic solution Place 1 drop into both eyes at bedtime.       . chlorpheniramine-HYDROcodone (TUSSIONEX) 10-8 MG/5ML LQCR as  directed.       No current facility-administered medications for this visit.    Pt meds includes: Statin:  yes Beta Blocker:  yes Aspirin:  yes ACEI:  no ARB:  no Other Antiplatelet/Anticoagulant:  no  Family History  Problem Relation Age of Onset  . Heart disease Father   . Heart attack Father     MI at age 80  . Hypertension Mother   . Alzheimer's disease Mother   . Hypertension Sister   . Diabetes Brother   . Hyperlipidemia Brother   . Hypertension Brother     History   Social History  . Marital Status: Single    Spouse Name: N/A    Number of Children: 1  . Years of Education: N/A   Occupational History  .  [employer]   Social History Main Topics  . Smoking status: Current Every Day Smoker    Types: Cigarettes  . Smokeless tobacco: Never Used     Comment: pt states that she is using the E cig only smokes 1-20 regular cigs per month  . Alcohol Use: 0 - .6 oz/week     0-1 Glasses of wine per week     Comment: Occasional  . Drug Use: No  . Sexual Activity: Not on file   Other Topics Concern  . Not on file   Social History Narrative  . No narrative on file     ROS: [x]  Positive   [ ]  Negative   [ ]  All sytems reviewed and are negative  Cardiovascular: []  chest pain/pressure []  palpitations []  SOB lying flat []  DOE [x]  pain in legs while walking []  pain in feet when lying flat []  hx of DVT []  hx of phlebitis []  swelling in legs []  varicose veins  Pulmonary: [x]  productive cough []  asthma []  wheezing  Neurologic: []  weakness in []  arms []  legs []  numbness in []  arms []  legs [] difficulty speaking or slurred speech []  temporary loss of vision in one eye []  dizziness  Hematologic: []  bleeding problems []  problems with blood clotting easily  GI []  vomiting blood []  blood in stool  GU: []  burning with urination []  blood in urine  Psychiatric: []  hx of major depression  Integumentary: []  rashes []  ulcers  Constitutional: []  fever []  chills   PHYSICAL EXAMINATION:  Filed Vitals:   09/18/13 1354  BP: 146/72  Pulse: 68  Resp: 18   Body mass index is 25.98 kg/(m^2).  General:  WDWN in NAD Gait: Not observed HENT: WNL, normocephalic Eyes: Pupils equal Pulmonary: normal non-labored breathing , without Rales,  Positive for rhonchi,   No wheezing Cardiac: RRR, without  Murmurs, rubs or gallops; without carotid bruits Abdomen: soft, NT, no masses Skin: without rashes, without ulcers  Vascular Exam/Pulses:  + palpable radial pulses bilaterally; + palpable femoral pulses bilaterally; pedal pulses are not palpable Extremities: without ischemic changes, without Gangrene , without cellulitis; without open wounds;  Musculoskeletal: no muscle wasting or atrophy  Neurologic: A&O X 3; Appropriate Affect ; SENSATION: normal; MOTOR FUNCTION:  moving all extremities equally. Speech is fluent/normal   Non-Invasive Vascular  Imaging:  Non today    ASSESSMENT: 67 y.o. female with life style limiting claudication  PLAN: -she is scheduled for a left femoral to popliteal bypass graft on 10/07/13.  Her vein mapping shows marginal vein and may need a composite graft for the bypass. -she does have bronchitis at the present time and is on prednisone as well as inhalers and  she has finished her ABx.  She is to go to [facility] for pre operative testing on 09/29/13.  If she is not well by then, she will let us know and we can postpone her surgery until she is feeling better. -she is scheduled for a stress test on September 30, 2013 -the dose of her Crestor has been halved due to her cholesterol levels being normal.   Doreatha Massed, PA-C [facility] [phone]  Clinic MD:  Pt seen and examined in conjunction with Dr. Darrick Penna  History and exam details as above. Vein mapping reviewed and interpreted.  Marginal both sides.  Reasonable vein to mid thigh on left and to knee on right.  Will most likely do composite left fem AK pop.  She has abberrant AT takeoff  Above the knee on right. Risks benefits procedure details discussed including but not limited to bleeding infection graft failure possible limb loss. She understands and agrees to proceed  Fabienne Bruns, MD [facility] [place] Office: [phone] Pager: [phone]

## 2013-09-24 ENCOUNTER — Encounter (HOSPITAL_COMMUNITY): Payer: Self-pay

## 2013-09-25 ENCOUNTER — Other Ambulatory Visit: Payer: Self-pay

## 2013-09-29 ENCOUNTER — Ambulatory Visit (HOSPITAL_COMMUNITY)
Admission: RE | Admit: 2013-09-29 | Discharge: 2013-09-29 | Disposition: A | Discharge status: Home / Self Care | Payer: BC Managed Care – PPO | Source: Ambulatory Visit | Attending: Anesthesiology | Admitting: Anesthesiology

## 2013-09-29 ENCOUNTER — Encounter (HOSPITAL_COMMUNITY)
Admission: RE | Admit: 2013-09-29 | Discharge: 2013-09-29 | Disposition: A | Discharge status: Home / Self Care | Payer: BC Managed Care – PPO | Source: Ambulatory Visit | Attending: Vascular Surgery | Admitting: Vascular Surgery

## 2013-09-29 ENCOUNTER — Encounter (HOSPITAL_COMMUNITY): Payer: Self-pay

## 2013-09-29 DIAGNOSIS — R059 Cough, unspecified: Secondary | ICD-10-CM | POA: Insufficient documentation

## 2013-09-29 DIAGNOSIS — R05 Cough: Secondary | ICD-10-CM | POA: Insufficient documentation

## 2013-09-29 HISTORY — DX: Overactive bladder: N32.81

## 2013-09-29 HISTORY — DX: Pneumonia, unspecified organism: J18.9

## 2013-09-29 HISTORY — DX: Reserved for concepts with insufficient information to code with codable children: IMO0002

## 2013-09-29 LAB — SURGICAL PCR SCREEN
MRSA, PCR: NEGATIVE
Staphylococcus aureus: NEGATIVE

## 2013-09-29 LAB — CBC
HCT: 36.9 % (ref 36.0–46.0)
Hemoglobin: 12.6 g/dL (ref 12.0–15.0)
MCH: 31.3 pg (ref 26.0–34.0)
MCHC: 34.1 g/dL (ref 30.0–36.0)
MCV: 91.6 fL (ref 78.0–100.0)
Platelets: 259 10*3/uL (ref 150–400)
RBC: 4.03 MIL/uL (ref 3.87–5.11)
RDW: 13.4 % (ref 11.5–15.5)
WBC: 6.9 10*3/uL (ref 4.0–10.5)

## 2013-09-29 LAB — URINALYSIS, ROUTINE W REFLEX MICROSCOPIC
Bilirubin Urine: NEGATIVE
Glucose, UA: NEGATIVE mg/dL
Hgb urine dipstick: NEGATIVE
Ketones, ur: NEGATIVE mg/dL
Leukocytes, UA: NEGATIVE
Nitrite: NEGATIVE
Protein, ur: NEGATIVE mg/dL
Specific Gravity, Urine: 1.019 (ref 1.005–1.030)
Urobilinogen, UA: 0.2 mg/dL (ref 0.0–1.0)
pH: 5.5 (ref 5.0–8.0)

## 2013-09-29 LAB — COMPREHENSIVE METABOLIC PANEL
ALT: 22 U/L (ref 0–35)
AST: 23 U/L (ref 0–37)
Albumin: 3.9 g/dL (ref 3.5–5.2)
Alkaline Phosphatase: 56 U/L (ref 39–117)
BUN: 14 mg/dL (ref 6–23)
CO2: 28 mEq/L (ref 19–32)
Calcium: 9.7 mg/dL (ref 8.4–10.5)
Chloride: 103 mEq/L (ref 96–112)
Creatinine, Ser: 0.69 mg/dL (ref 0.50–1.10)
GFR calc Af Amer: >90 mL/min (ref >90)
GFR calc non Af Amer: 87 mL/min — ABNORMAL LOW (ref >90)
Glucose, Bld: 101 mg/dL — ABNORMAL HIGH (ref 70–99)
Potassium: 3.6 mEq/L (ref 3.5–5.1)
Sodium: 140 mEq/L (ref 135–145)
Total Bilirubin: 0.2 mg/dL — ABNORMAL LOW (ref 0.3–1.2)
Total Protein: 7.7 g/dL (ref 6.0–8.3)

## 2013-09-29 LAB — APTT: aPTT: 26 seconds (ref 24–37)

## 2013-09-29 LAB — TYPE AND SCREEN
ABO/RH(D): A POS
Antibody Screen: NEGATIVE

## 2013-09-29 LAB — PROTIME-INR
INR: 0.88 (ref 0.00–1.49)
Prothrombin Time: 11.8 seconds (ref 11.6–15.2)

## 2013-09-29 LAB — ABO/RH: ABO/RH(D): A POS

## 2013-09-29 NOTE — Pre-Procedure Instructions (Signed)
Jill Higgins  09/29/2013   Your procedure is scheduled on: Tuesday November 18 @ 0745 AM  Report to  at [facility] Short Stay Entrance A at 5:30 AM.  Call this number if you have problems the morning of surgery: [phone]   Remember:   Do not eat food or drink liquids after midnight.   Take these medicines the morning of surgery with A SIP OF WATER: amlodipine-olmesartan (Azor), atenolol (Tenormin), ProAir inhaler (bring inhaler with you) and benzonatate (Tessalon) if needed  Do not take Goody's, Aleve, Ibuprofen, Fish Oil or Herbal Supplements   Do not wear jewelry, make-up or nail polish.  Do not wear lotions, powders, or perfumes. You may wear deodorant.  Do not shave 48 hours prior to surgery.  Do not bring valuables to the hospital.  [facility] is not responsible for any belongings or valuables.               Contacts, dentures or bridgework may not be worn into surgery.  Leave suitcase in the car. After surgery it may be brought to your room.  For patients admitted to the hospital, discharge time is determined by your  treatment team.                Please read over the following fact sheets that you were given: Pain Booklet, Coughing and Deep Breathing, Blood Transfusion Information, MRSA Information and Surgical Site Infection Prevention

## 2013-09-29 NOTE — Progress Notes (Addendum)
Pt reports that Dr. Darrick Penna requested pt have a stress test which is to be done 09/30/2013 at 0900 in Dr. Ludwig Clarks office. Pt reports that CXR was done at Urgent Care on [address] about 3 weeks ago when she was diagnosed and treated for bronchitis. Pt reports that symptoms had improved after completing antibiotics and steroids but cough returned yesterday (09/28/2013). Pt denied fever, chest pain or shortness of breath. Chest xray (2 view) repeated. Chart given to [place], [place] for review.  Pt does not have a Cardiologist and denies having an ECHO or stress test in the past.  Pt's PCP is Candace Darolyn Rua.

## 2013-09-29 NOTE — Progress Notes (Signed)
Confirmed with Oswaldo Done, RN at Dr. Darrick Penna office that [facility] is Careers adviser not Myra Gianotti.

## 2013-09-30 ENCOUNTER — Ambulatory Visit (HOSPITAL_COMMUNITY): Payer: BC Managed Care – PPO | Attending: Cardiology | Admitting: Radiology

## 2013-09-30 ENCOUNTER — Encounter: Payer: Self-pay | Admitting: Cardiology

## 2013-09-30 VITALS — BP 114/71 | HR 56 | Ht 62.0 in | Wt 147.0 lb

## 2013-09-30 DIAGNOSIS — R0989 Other specified symptoms and signs involving the circulatory and respiratory systems: Secondary | ICD-10-CM | POA: Insufficient documentation

## 2013-09-30 DIAGNOSIS — Z01818 Encounter for other preprocedural examination: Secondary | ICD-10-CM

## 2013-09-30 DIAGNOSIS — E785 Hyperlipidemia, unspecified: Secondary | ICD-10-CM | POA: Insufficient documentation

## 2013-09-30 DIAGNOSIS — I739 Peripheral vascular disease, unspecified: Secondary | ICD-10-CM | POA: Insufficient documentation

## 2013-09-30 DIAGNOSIS — Z01810 Encounter for preprocedural cardiovascular examination: Secondary | ICD-10-CM | POA: Insufficient documentation

## 2013-09-30 DIAGNOSIS — Z87891 Personal history of nicotine dependence: Secondary | ICD-10-CM | POA: Insufficient documentation

## 2013-09-30 DIAGNOSIS — R0609 Other forms of dyspnea: Secondary | ICD-10-CM | POA: Insufficient documentation

## 2013-09-30 DIAGNOSIS — Z8249 Family history of ischemic heart disease and other diseases of the circulatory system: Secondary | ICD-10-CM | POA: Insufficient documentation

## 2013-09-30 DIAGNOSIS — I10 Essential (primary) hypertension: Secondary | ICD-10-CM | POA: Insufficient documentation

## 2013-09-30 DIAGNOSIS — E119 Type 2 diabetes mellitus without complications: Secondary | ICD-10-CM | POA: Insufficient documentation

## 2013-09-30 DIAGNOSIS — R0602 Shortness of breath: Secondary | ICD-10-CM

## 2013-09-30 MED ORDER — REGADENOSON 0.4 MG/5ML IV SOLN
0.4000 mg | Freq: Once | INTRAVENOUS | Status: AC
  Administered 2013-09-30: 0.4 mg via INTRAVENOUS

## 2013-09-30 MED ORDER — TECHNETIUM TC 99M SESTAMIBI GENERIC - CARDIOLITE
33.0000 | Freq: Once | INTRAVENOUS | Status: AC | PRN
  Administered 2013-09-30: 33 via INTRAVENOUS

## 2013-09-30 MED ORDER — TECHNETIUM TC 99M SESTAMIBI GENERIC - CARDIOLITE
11.0000 | Freq: Once | INTRAVENOUS | Status: AC | PRN
  Administered 2013-09-30: 11 via INTRAVENOUS

## 2013-09-30 NOTE — Progress Notes (Signed)
Anesthesia Chart Review:  Patient is a 67 year old female scheduled for left FPBG on 10/07/13 by Dr. Darrick Penna.  FPBG was recommended following recent aortogram with BLE run-off on 08/15/13.  History includes smoking, HTN, HLD, GERD, PAD s/p SFA stents now occluded, fibromyalgia, DM2, depression, anxiety, overactive bladder.  PCP is Dr. Merri Brunette.  Patient was referred to cardiologist Dr. Jens Som for a preoperative evaluation on 09/08/13. Lexiscan was recommended and showed no ischemia.  Nuclear stress test on 09/30/13 showed: Normal stress nuclear study with a small, mild intensity, fixed apical defect consistent with apical thinning; no ischemia. .LV Ejection Fraction: 55%. LV Wall Motion: NL LV Function; NL Wall Motion.  EKG on 08/15/13 showed SB.  Preoperative CXR and labs noted.  Anticipate that she can proceed as planned.  Velna Ochs [facility] Short Stay Center/Anesthesiology Phone [phone] 09/30/2013 6:17 PM

## 2013-09-30 NOTE — Progress Notes (Signed)
[facility] SITE 3 NUCLEAR MED [address] [place], [place] [address] [phone]    Cardiology Nuclear Med Study  Jill Higgins is a 67 y.o. female     MRN : [id number]     DOB: [birth year]  Procedure Date: 09/30/2013  Nuclear Med Background Indication for Stress Test:  Evaluation for Ischemia, and Surgical Clearance for  Fem-Popliteal Bypass on 10-07-13 by Fabienne Bruns, MD History:  Non indicated Cardiac Risk Factors: Family History - CAD, History of Smoking (quit 6 weeks ago), Hypertension, Lipids, NIDDM and PVD  Symptoms:  DOE   Nuclear Pre-Procedure Caffeine/Decaff Intake:  None > 12 hrs NPO After: 4:00pm   Lungs:  Audible wheezing O2 Sat: 98% on room air. Patient used pro-air inhaler prior to test.  IV 0.9% NS with Angio Cath:  22g  IV Site: L Antecubital x 1, tolerated well IV Started by:  Irean Hong, RN  Chest Size (in):  34 Cup Size: B  Height: 5\' 2"  (1.575 m)  Weight:  147 lb (66.679 kg)  BMI:  Body mass index is 26.88 kg/(m^2). Tech Comments: Patient took atenolol and janumet at 0800 today.Patient given cranberry juice. Irean Hong, RN.    Nuclear Med Study 1 or 2 day study: 1 day  Stress Test Type:  Eugenie Birks  Reading MD: Olga Millers, MD  Order Authorizing Provider:  Olga Millers, MD  Resting Radionuclide: Technetium 72m Sestamibi  Resting Radionuclide Dose: 11.0 mCi   Stress Radionuclide:  Technetium 67m Sestamibi  Stress Radionuclide Dose: 33.0 mCi           Stress Protocol Rest HR: 56 Stress HR: 79  Rest BP: 114/71 Stress BP: 119/66  Exercise Time (min): n/a METS: n/a   Predicted Max HR: 152 bpm % Max HR: 51.97 bpm Rate Pressure Product: 9401   Dose of Adenosine (mg):  n/a Dose of Lexiscan: 0.4 mg  Dose of Atropine (mg): n/a Dose of Dobutamine: n/a mcg/kg/min (at max HR)  Stress Test Technologist: Nelson Chimes, BS-ES  Nuclear Technologist:  Dario Guardian, CNMT     Rest Procedure:  Myocardial perfusion imaging was performed  at rest 45 minutes following the intravenous administration of Technetium 70m Sestamibi. Rest ECG: Sinus bradycardia, cannot R/O prior septal MI.  Stress Procedure:  The patient received IV Lexiscan 0.4 mg over 15-seconds.  Technetium 92m Sestamibi injected at 30-seconds.  Quantitative spect images were obtained after a 45 minute delay.  During the infusion of Lexiscan, the patient complained of SOB, stomach upset, chest tightness and heavy legs.  Symptoms began to resolve in recovery. Stress ECG: No significant ST segment change suggestive of ischemia.  QPS Raw Data Images:  Acquisition technically good; normal left ventricular size. Stress Images:  There is decreased uptake in the apex. Rest Images:  There is decreased uptake in the apex. Subtraction (SDS):  No evidence of ischemia. Transient Ischemic Dilatation (Normal <1.22):  1.00 Lung/Heart Ratio (Normal <0.45):  0.27  Quantitative Gated Spect Images QGS EDV:  77 ml QGS ESV:  34 ml  Impression Exercise Capacity:  Lexiscan with no exercise. BP Response:  Normal blood pressure response. Clinical Symptoms:  There is chest pain and dyspnea. ECG Impression:  No significant ST segment change suggestive of ischemia. Comparison with Prior Nuclear Study: No images to compare  Overall Impression:  Normal stress nuclear study with a small, mild intensity, fixed apical defect consistent with apical thinning; no ischemia.  LV Ejection Fraction: 55%.  LV Wall Motion:  NL  LV Function; NL Wall Motion  Olga Millers

## 2013-10-01 ENCOUNTER — Telehealth: Payer: Self-pay | Admitting: *Deleted

## 2013-10-01 NOTE — Telephone Encounter (Signed)
Jill Higgins called stating that she was being treated for bronchitis. She is on a steroid pack,inhaler and cough medicine. She is still congested & wheezing. She asked to postpone her surgery from 10/07/13 to 10/21/13. I moved the surgery.

## 2013-10-06 ENCOUNTER — Other Ambulatory Visit: Payer: Self-pay | Admitting: *Deleted

## 2013-10-06 ENCOUNTER — Other Ambulatory Visit: Payer: Self-pay

## 2013-10-06 ENCOUNTER — Encounter: Payer: Self-pay | Admitting: *Deleted

## 2013-10-07 ENCOUNTER — Encounter: Payer: Self-pay | Admitting: Vascular Surgery

## 2013-10-14 ENCOUNTER — Encounter (HOSPITAL_COMMUNITY)
Admission: RE | Admit: 2013-10-14 | Discharge: 2013-10-14 | Disposition: A | Discharge status: Home / Self Care | Payer: BC Managed Care – PPO | Source: Ambulatory Visit | Attending: Vascular Surgery | Admitting: Vascular Surgery

## 2013-10-14 DIAGNOSIS — Z01812 Encounter for preprocedural laboratory examination: Secondary | ICD-10-CM | POA: Insufficient documentation

## 2013-10-14 DIAGNOSIS — Z01818 Encounter for other preprocedural examination: Secondary | ICD-10-CM | POA: Insufficient documentation

## 2013-10-14 LAB — POCT I-STAT, CHEM 8
BUN: 14 mg/dL (ref 6–23)
Calcium, Ion: 1.24 mmol/L (ref 1.13–1.30)
Chloride: 108 mEq/L (ref 96–112)
Creatinine, Ser: 0.9 mg/dL (ref 0.50–1.10)
Glucose, Bld: 90 mg/dL (ref 70–99)
HCT: 36 % (ref 36.0–46.0)
Hemoglobin: 12.2 g/dL (ref 12.0–15.0)
Potassium: 3.8 mEq/L (ref 3.5–5.1)
Sodium: 142 mEq/L (ref 135–145)
TCO2: 23 mmol/L (ref 0–100)

## 2013-10-14 NOTE — Progress Notes (Signed)
Ok to perform I STAT at [facility] per Darel Hong

## 2013-10-14 NOTE — Progress Notes (Signed)
Anesthesia follow-up:  See my note from 09/30/2013.  Surgery was postponed due to bronchitis.  She was prescribed antibiotics by her PCP Dr. Merri Brunette and is due to complete them within the next 1-2 days.  She is feeling much better from a pulmonary standpoint.  She is not having to use her ProAir HFA any more.  Her labs were > 86 days old, so Dr. Darrick Penna had requested that an ISTAT 8 and T&S be done prior to the day of surgery.  Her previous CBC, CMET, PT/PTT, UA were unremarkable from 09/29/13.  CXR today showed mild chronic bronchitic changes, but no acute abnormalities.  Lungs sounds were clear today without wheezing, heart RRR.  Anticipate that she can proceed as planned.  Jill Higgins [facility] Short Stay Center/Anesthesiology Phone [phone] 10/14/2013 5:38 PM

## 2013-10-14 NOTE — Pre-Procedure Instructions (Addendum)
Jill Higgins  10/14/2013   Your procedure is scheduled on:  October 21, 2013  Report to [facility](Entrance A) at 5:30 AM.  Call this number if you have problems the morning of surgery: [phone]   Remember:   Do not eat food or drink liquids after midnight.   Take these medicines the morning of surgery with A SIP OF WATER: atenolol (TENORMIN),  chlorpheniramine-HYDROcodone (TUSSIONEX) as needed, Dextromethorphan-Guaifenesin as  needed, PROAIR HFA inhaler as needed       Do not wear jewelry, make-up or nail polish.  Do not wear lotions, powders, or perfumes. You may wear deodorant.  Do not shave 48 hours prior to surgery. Men may shave face and neck.  Do not bring valuables to the hospital.  [facility] is not responsible                  for any belongings or valuables.               Contacts, dentures or bridgework may not be worn into surgery.  Leave suitcase in the car. After surgery it may be brought to your room.  For patients admitted to the hospital, discharge time is determined by your                treatment team.               Patients discharged the day of surgery will not be allowed to drive  home.  Name and phone number of your driver:   Special Instructions: Shower using CHG 2 nights before surgery and the night before surgery.  If you shower the day of surgery use CHG.  Use special wash - you have one bottle of CHG for all showers.  You should use approximately 1/3 of the bottle for each shower.   Please read over the following fact sheets that you were given: Pain Booklet, Coughing and Deep Breathing, Blood Transfusion Information and Surgical Site Infection Prevention

## 2013-10-20 MED ORDER — CEFUROXIME SODIUM 1.5 G IJ SOLR
1.5000 g | INTRAMUSCULAR | Status: AC
  Administered 2013-10-21: .75 g via INTRAVENOUS
  Administered 2013-10-21: 1.5 g via INTRAVENOUS
  Filled 2013-10-20: qty 1.5

## 2013-10-21 ENCOUNTER — Encounter (HOSPITAL_COMMUNITY): Payer: BC Managed Care – PPO | Admitting: Vascular Surgery

## 2013-10-21 ENCOUNTER — Inpatient Hospital Stay (HOSPITAL_COMMUNITY): Payer: BC Managed Care – PPO | Admitting: Anesthesiology

## 2013-10-21 ENCOUNTER — Inpatient Hospital Stay (HOSPITAL_COMMUNITY)
Admission: RE | Admit: 2013-10-21 | Discharge: 2013-11-03 | DRG: 253 | Disposition: A | Discharge status: Home Health | Payer: BC Managed Care – PPO | Source: Ambulatory Visit | Attending: Vascular Surgery | Admitting: Vascular Surgery

## 2013-10-21 ENCOUNTER — Encounter (HOSPITAL_COMMUNITY)
Admission: RE | Disposition: A | Discharge status: Home Health | Payer: Self-pay | Source: Ambulatory Visit | Attending: Vascular Surgery

## 2013-10-21 ENCOUNTER — Inpatient Hospital Stay (HOSPITAL_COMMUNITY): Payer: BC Managed Care – PPO

## 2013-10-21 ENCOUNTER — Encounter (HOSPITAL_COMMUNITY): Payer: Self-pay | Admitting: Anesthesiology

## 2013-10-21 DIAGNOSIS — F341 Dysthymic disorder: Secondary | ICD-10-CM | POA: Diagnosis present

## 2013-10-21 DIAGNOSIS — E785 Hyperlipidemia, unspecified: Secondary | ICD-10-CM

## 2013-10-21 DIAGNOSIS — R5381 Other malaise: Secondary | ICD-10-CM | POA: Diagnosis not present

## 2013-10-21 DIAGNOSIS — Z79899 Other long term (current) drug therapy: Secondary | ICD-10-CM

## 2013-10-21 DIAGNOSIS — J4489 Other specified chronic obstructive pulmonary disease: Secondary | ICD-10-CM | POA: Diagnosis present

## 2013-10-21 DIAGNOSIS — E1149 Type 2 diabetes mellitus with other diabetic neurological complication: Secondary | ICD-10-CM | POA: Diagnosis present

## 2013-10-21 DIAGNOSIS — M79609 Pain in unspecified limb: Secondary | ICD-10-CM

## 2013-10-21 DIAGNOSIS — Z7982 Long term (current) use of aspirin: Secondary | ICD-10-CM

## 2013-10-21 DIAGNOSIS — Y832 Surgical operation with anastomosis, bypass or graft as the cause of abnormal reaction of the patient, or of later complication, without mention of misadventure at the time of the procedure: Secondary | ICD-10-CM | POA: Diagnosis not present

## 2013-10-21 DIAGNOSIS — J9819 Other pulmonary collapse: Secondary | ICD-10-CM | POA: Diagnosis not present

## 2013-10-21 DIAGNOSIS — Z7902 Long term (current) use of antithrombotics/antiplatelets: Secondary | ICD-10-CM

## 2013-10-21 DIAGNOSIS — IMO0001 Reserved for inherently not codable concepts without codable children: Secondary | ICD-10-CM | POA: Diagnosis present

## 2013-10-21 DIAGNOSIS — J449 Chronic obstructive pulmonary disease, unspecified: Secondary | ICD-10-CM | POA: Diagnosis present

## 2013-10-21 DIAGNOSIS — F17200 Nicotine dependence, unspecified, uncomplicated: Secondary | ICD-10-CM | POA: Diagnosis present

## 2013-10-21 DIAGNOSIS — K5900 Constipation, unspecified: Secondary | ICD-10-CM | POA: Diagnosis not present

## 2013-10-21 DIAGNOSIS — I10 Essential (primary) hypertension: Secondary | ICD-10-CM | POA: Diagnosis present

## 2013-10-21 DIAGNOSIS — Y9210 Unspecified residential institution as the place of occurrence of the external cause: Secondary | ICD-10-CM | POA: Diagnosis not present

## 2013-10-21 DIAGNOSIS — I998 Other disorder of circulatory system: Secondary | ICD-10-CM | POA: Diagnosis present

## 2013-10-21 DIAGNOSIS — I70219 Atherosclerosis of native arteries of extremities with intermittent claudication, unspecified extremity: Secondary | ICD-10-CM

## 2013-10-21 DIAGNOSIS — K219 Gastro-esophageal reflux disease without esophagitis: Secondary | ICD-10-CM | POA: Diagnosis present

## 2013-10-21 DIAGNOSIS — IMO0002 Reserved for concepts with insufficient information to code with codable children: Secondary | ICD-10-CM | POA: Diagnosis not present

## 2013-10-21 DIAGNOSIS — D62 Acute posthemorrhagic anemia: Secondary | ICD-10-CM | POA: Diagnosis not present

## 2013-10-21 DIAGNOSIS — I739 Peripheral vascular disease, unspecified: Principal | ICD-10-CM | POA: Diagnosis present

## 2013-10-21 DIAGNOSIS — E1142 Type 2 diabetes mellitus with diabetic polyneuropathy: Secondary | ICD-10-CM | POA: Diagnosis present

## 2013-10-21 HISTORY — PX: FEMORAL-POPLITEAL BYPASS GRAFT: SHX937

## 2013-10-21 LAB — HEMOGLOBIN A1C
Hgb A1c MFr Bld: 6.9 % — ABNORMAL HIGH (ref <5.7)
Mean Plasma Glucose: 151 mg/dL — ABNORMAL HIGH (ref <117)

## 2013-10-21 LAB — CBC
HCT: 22.9 % — ABNORMAL LOW (ref 36.0–46.0)
Hemoglobin: 7.8 g/dL — ABNORMAL LOW (ref 12.0–15.0)
MCH: 31.3 pg (ref 26.0–34.0)
MCHC: 34.1 g/dL (ref 30.0–36.0)
MCV: 92 fL (ref 78.0–100.0)
Platelets: 202 10*3/uL (ref 150–400)
RBC: 2.49 MIL/uL — ABNORMAL LOW (ref 3.87–5.11)
RDW: 13.6 % (ref 11.5–15.5)
WBC: 10.7 10*3/uL — ABNORMAL HIGH (ref 4.0–10.5)

## 2013-10-21 LAB — CREATININE, SERUM
Creatinine, Ser: 0.73 mg/dL (ref 0.50–1.10)
GFR calc Af Amer: >90 mL/min (ref >90)
GFR calc non Af Amer: 86 mL/min — ABNORMAL LOW (ref >90)

## 2013-10-21 LAB — GLUCOSE, CAPILLARY
Glucose-Capillary: 153 mg/dL — ABNORMAL HIGH (ref 70–99)
Glucose-Capillary: 160 mg/dL — ABNORMAL HIGH (ref 70–99)

## 2013-10-21 SURGERY — BYPASS GRAFT FEMORAL-POPLITEAL ARTERY
Anesthesia: General | Site: Leg Upper | Laterality: Left

## 2013-10-21 MED ORDER — ATENOLOL 50 MG PO TABS
50.0000 mg | ORAL_TABLET | Freq: Every day | ORAL | Status: DC
  Administered 2013-10-23 – 2013-11-02 (×7): 50 mg via ORAL
  Filled 2013-10-21 (×13): qty 1

## 2013-10-21 MED ORDER — METOPROLOL TARTRATE 1 MG/ML IV SOLN: 2.0000 mg | INTRAVENOUS | Status: DC | PRN

## 2013-10-21 MED ORDER — SODIUM CHLORIDE 0.9 % IV SOLN: INTRAVENOUS | Status: DC

## 2013-10-21 MED ORDER — PANTOPRAZOLE SODIUM 40 MG PO TBEC
40.0000 mg | DELAYED_RELEASE_TABLET | Freq: Every day | ORAL | Status: DC
  Administered 2013-10-21 – 2013-11-03 (×13): 40 mg via ORAL
  Filled 2013-10-21 (×11): qty 1

## 2013-10-21 MED ORDER — ASPIRIN EC 81 MG PO TBEC
81.0000 mg | DELAYED_RELEASE_TABLET | Freq: Every day | ORAL | Status: DC
  Administered 2013-10-22 – 2013-11-03 (×13): 81 mg via ORAL
  Filled 2013-10-21 (×13): qty 1

## 2013-10-21 MED ORDER — PAPAVERINE HCL 30 MG/ML IJ SOLN
INTRAMUSCULAR | Status: AC
  Filled 2013-10-21: qty 2

## 2013-10-21 MED ORDER — IOHEXOL 300 MG/ML  SOLN
INTRAMUSCULAR | Status: DC | PRN
  Administered 2013-10-21: 50 mL

## 2013-10-21 MED ORDER — DOPAMINE-DEXTROSE 3.2-5 MG/ML-% IV SOLN
2.0000 ug/kg/min | INTRAVENOUS | Status: DC
  Administered 2013-10-21: 2 ug/kg/min via INTRAVENOUS
  Filled 2013-10-21: qty 250

## 2013-10-21 MED ORDER — ATENOLOL 50 MG PO TABS
50.0000 mg | ORAL_TABLET | Freq: Once | ORAL | Status: AC
  Administered 2013-10-21: 50 mg via ORAL
  Filled 2013-10-21: qty 1

## 2013-10-21 MED ORDER — PHENOL 1.4 % MT LIQD: 1.0000 | OROMUCOSAL | Status: DC | PRN

## 2013-10-21 MED ORDER — DEXTROSE 5 % IV SOLN
750.0000 mg | Freq: Once | INTRAVENOUS | Status: DC
  Filled 2013-10-21: qty 750

## 2013-10-21 MED ORDER — DEXTROSE 5 % IV SOLN: 1.5000 g | INTRAVENOUS | Status: DC

## 2013-10-21 MED ORDER — MORPHINE SULFATE 2 MG/ML IJ SOLN
2.0000 mg | INTRAMUSCULAR | Status: DC | PRN
  Administered 2013-10-21 – 2013-10-22 (×6): 2 mg via INTRAVENOUS
  Filled 2013-10-21 (×8): qty 1

## 2013-10-21 MED ORDER — TEMAZEPAM 15 MG PO CAPS
30.0000 mg | ORAL_CAPSULE | Freq: Every evening | ORAL | Status: DC | PRN
  Administered 2013-10-22 – 2013-11-02 (×2): 30 mg via ORAL
  Filled 2013-10-21 (×2): qty 2

## 2013-10-21 MED ORDER — LATANOPROST 0.005 % OP SOLN
1.0000 [drp] | Freq: Every day | OPHTHALMIC | Status: DC
  Administered 2013-10-22 – 2013-11-02 (×12): 1 [drp] via OPHTHALMIC
  Filled 2013-10-21 (×2): qty 2.5

## 2013-10-21 MED ORDER — ALBUTEROL SULFATE HFA 108 (90 BASE) MCG/ACT IN AERS
2.0000 | INHALATION_SPRAY | RESPIRATORY_TRACT | Status: DC | PRN
  Filled 2013-10-21: qty 6.7

## 2013-10-21 MED ORDER — ROCURONIUM BROMIDE 100 MG/10ML IV SOLN
INTRAVENOUS | Status: DC | PRN
  Administered 2013-10-21: 45 mg via INTRAVENOUS

## 2013-10-21 MED ORDER — LIDOCAINE HCL 4 % MT SOLN
OROMUCOSAL | Status: DC | PRN
  Administered 2013-10-21: 3 mL via TOPICAL

## 2013-10-21 MED ORDER — ALBUMIN HUMAN 5 % IV SOLN
INTRAVENOUS | Status: DC | PRN
  Administered 2013-10-21: 10:00:00 via INTRAVENOUS

## 2013-10-21 MED ORDER — HYDROMORPHONE HCL PF 1 MG/ML IJ SOLN
INTRAMUSCULAR | Status: AC
  Filled 2013-10-21: qty 1

## 2013-10-21 MED ORDER — GUAIFENESIN-DM 100-10 MG/5ML PO SYRP: 15.0000 mL | ORAL_SOLUTION | ORAL | Status: DC | PRN

## 2013-10-21 MED ORDER — EPHEDRINE SULFATE 50 MG/ML IJ SOLN
INTRAMUSCULAR | Status: DC | PRN
  Administered 2013-10-21 (×2): 10 mg via INTRAVENOUS

## 2013-10-21 MED ORDER — SODIUM CHLORIDE 0.9 % IV SOLN
INTRAVENOUS | Status: DC
  Administered 2013-10-21: 18:00:00 via INTRAVENOUS

## 2013-10-21 MED ORDER — MIDAZOLAM HCL 5 MG/5ML IJ SOLN
INTRAMUSCULAR | Status: DC | PRN
  Administered 2013-10-21 (×2): 0.5 mg via INTRAVENOUS

## 2013-10-21 MED ORDER — LACTATED RINGERS IV SOLN
INTRAVENOUS | Status: DC | PRN
  Administered 2013-10-21 (×5): via INTRAVENOUS

## 2013-10-21 MED ORDER — ACETAMINOPHEN 325 MG PO TABS
325.0000 mg | ORAL_TABLET | ORAL | Status: DC | PRN
  Administered 2013-10-22 (×2): 650 mg via ORAL
  Filled 2013-10-21 (×2): qty 2

## 2013-10-21 MED ORDER — SODIUM CHLORIDE 0.9 % IR SOLN
Status: DC | PRN
  Administered 2013-10-21: 08:00:00

## 2013-10-21 MED ORDER — AMLODIPINE-OLMESARTAN 5-40 MG PO TABS: 1.0000 | ORAL_TABLET | Freq: Every day | ORAL | Status: DC

## 2013-10-21 MED ORDER — ARTIFICIAL TEARS OP OINT
TOPICAL_OINTMENT | OPHTHALMIC | Status: DC | PRN
  Administered 2013-10-21: 1 via OPHTHALMIC

## 2013-10-21 MED ORDER — IRBESARTAN 300 MG PO TABS
300.0000 mg | ORAL_TABLET | Freq: Every day | ORAL | Status: DC
  Administered 2013-10-22 – 2013-11-02 (×12): 300 mg via ORAL
  Filled 2013-10-21 (×13): qty 1

## 2013-10-21 MED ORDER — SODIUM CHLORIDE 0.9 % IV SOLN
500.0000 mL | Freq: Once | INTRAVENOUS | Status: AC | PRN
  Administered 2013-10-21: 500 mL via INTRAVENOUS

## 2013-10-21 MED ORDER — DEXTROSE 5 % IV SOLN
1.5000 g | Freq: Three times a day (TID) | INTRAVENOUS | Status: AC
  Administered 2013-10-21 – 2013-10-22 (×2): 1.5 g via INTRAVENOUS
  Filled 2013-10-21 (×2): qty 1.5

## 2013-10-21 MED ORDER — ATORVASTATIN CALCIUM 10 MG PO TABS
10.0000 mg | ORAL_TABLET | Freq: Every day | ORAL | Status: DC
  Administered 2013-10-22 – 2013-11-02 (×12): 10 mg via ORAL
  Filled 2013-10-21 (×14): qty 1

## 2013-10-21 MED ORDER — DEXTROSE 5 % IV SOLN
INTRAVENOUS | Status: DC | PRN
  Administered 2013-10-21: 08:00:00 via INTRAVENOUS

## 2013-10-21 MED ORDER — INSULIN ASPART 100 UNIT/ML ~~LOC~~ SOLN
0.0000 [IU] | Freq: Three times a day (TID) | SUBCUTANEOUS | Status: DC
  Administered 2013-10-22 (×2): 3 [IU] via SUBCUTANEOUS
  Administered 2013-10-22 – 2013-10-23 (×2): 2 [IU] via SUBCUTANEOUS
  Administered 2013-10-23 (×2): 3 [IU] via SUBCUTANEOUS
  Administered 2013-10-24 – 2013-10-25 (×3): 2 [IU] via SUBCUTANEOUS
  Administered 2013-10-25 – 2013-10-26 (×3): 3 [IU] via SUBCUTANEOUS
  Administered 2013-10-27 – 2013-10-28 (×4): 2 [IU] via SUBCUTANEOUS
  Administered 2013-10-29: 3 [IU] via SUBCUTANEOUS
  Administered 2013-10-29 – 2013-10-30 (×5): 2 [IU] via SUBCUTANEOUS
  Administered 2013-10-31: 3 [IU] via SUBCUTANEOUS
  Administered 2013-10-31 – 2013-11-01 (×4): 2 [IU] via SUBCUTANEOUS
  Administered 2013-11-02: 3 [IU] via SUBCUTANEOUS
  Administered 2013-11-02 – 2013-11-03 (×3): 2 [IU] via SUBCUTANEOUS

## 2013-10-21 MED ORDER — POTASSIUM CHLORIDE CRYS ER 20 MEQ PO TBCR: 20.0000 meq | EXTENDED_RELEASE_TABLET | Freq: Every day | ORAL | Status: DC | PRN

## 2013-10-21 MED ORDER — FENTANYL CITRATE 0.05 MG/ML IJ SOLN
INTRAMUSCULAR | Status: DC | PRN
  Administered 2013-10-21 (×2): 50 ug via INTRAVENOUS
  Administered 2013-10-21 (×2): 25 ug via INTRAVENOUS
  Administered 2013-10-21 (×3): 50 ug via INTRAVENOUS

## 2013-10-21 MED ORDER — LIDOCAINE HCL (CARDIAC) 20 MG/ML IV SOLN
INTRAVENOUS | Status: DC | PRN
  Administered 2013-10-21: 80 mg via INTRAVENOUS

## 2013-10-21 MED ORDER — ONDANSETRON HCL 4 MG/2ML IJ SOLN: 4.0000 mg | Freq: Once | INTRAMUSCULAR | Status: DC | PRN

## 2013-10-21 MED ORDER — GLYCOPYRROLATE 0.2 MG/ML IJ SOLN
INTRAMUSCULAR | Status: DC | PRN
  Administered 2013-10-21: .2 mg via INTRAVENOUS
  Administered 2013-10-21: 0.2 mg via INTRAVENOUS
  Administered 2013-10-21: 0.4 mg via INTRAVENOUS

## 2013-10-21 MED ORDER — THROMBIN 20000 UNITS EX SOLR
CUTANEOUS | Status: AC
  Filled 2013-10-21: qty 20000

## 2013-10-21 MED ORDER — HEPARIN SODIUM (PORCINE) 1000 UNIT/ML IJ SOLN
INTRAMUSCULAR | Status: DC | PRN
  Administered 2013-10-21: 7000 [IU] via INTRAVENOUS
  Administered 2013-10-21: 5000 [IU] via INTRAVENOUS
  Administered 2013-10-21: 7000 [IU] via INTRAVENOUS

## 2013-10-21 MED ORDER — OXYCODONE-ACETAMINOPHEN 5-325 MG PO TABS
1.0000 | ORAL_TABLET | ORAL | Status: DC | PRN
  Administered 2013-10-22 – 2013-10-24 (×8): 2 via ORAL
  Administered 2013-10-24: 1 via ORAL
  Administered 2013-10-25 – 2013-10-28 (×16): 2 via ORAL
  Administered 2013-10-29 (×2): 1 via ORAL
  Administered 2013-10-29: 2 via ORAL
  Administered 2013-10-29 (×2): 1 via ORAL
  Administered 2013-10-30 – 2013-11-03 (×17): 2 via ORAL
  Filled 2013-10-21 (×4): qty 2
  Filled 2013-10-21: qty 1
  Filled 2013-10-21 (×11): qty 2
  Filled 2013-10-21: qty 1
  Filled 2013-10-21 (×20): qty 2
  Filled 2013-10-21: qty 1
  Filled 2013-10-21 (×2): qty 2
  Filled 2013-10-21: qty 1
  Filled 2013-10-21 (×7): qty 2

## 2013-10-21 MED ORDER — NEOSTIGMINE METHYLSULFATE 1 MG/ML IJ SOLN
INTRAMUSCULAR | Status: DC | PRN
  Administered 2013-10-21: 3 mg via INTRAVENOUS

## 2013-10-21 MED ORDER — DOCUSATE SODIUM 100 MG PO CAPS
100.0000 mg | ORAL_CAPSULE | Freq: Every day | ORAL | Status: DC
  Administered 2013-10-22 – 2013-11-03 (×13): 100 mg via ORAL
  Filled 2013-10-21 (×13): qty 1

## 2013-10-21 MED ORDER — DEXTROSE 5 % IV SOLN
INTRAVENOUS | Status: DC | PRN
  Administered 2013-10-21: 14:00:00 via INTRAVENOUS

## 2013-10-21 MED ORDER — PHENYLEPHRINE HCL 10 MG/ML IJ SOLN
INTRAMUSCULAR | Status: DC | PRN
  Administered 2013-10-21 (×2): 80 ug via INTRAVENOUS
  Administered 2013-10-21 (×6): 40 ug via INTRAVENOUS

## 2013-10-21 MED ORDER — HYDROMORPHONE HCL PF 1 MG/ML IJ SOLN
0.2500 mg | INTRAMUSCULAR | Status: DC | PRN
  Administered 2013-10-21 (×4): 0.5 mg via INTRAVENOUS

## 2013-10-21 MED ORDER — ENOXAPARIN SODIUM 40 MG/0.4ML ~~LOC~~ SOLN
40.0000 mg | SUBCUTANEOUS | Status: DC
  Administered 2013-10-22: 40 mg via SUBCUTANEOUS
  Filled 2013-10-21 (×2): qty 0.4

## 2013-10-21 MED ORDER — 0.9 % SODIUM CHLORIDE (POUR BTL) OPTIME
TOPICAL | Status: DC | PRN
  Administered 2013-10-21: 2000 mL

## 2013-10-21 MED ORDER — PROPOFOL 10 MG/ML IV BOLUS
INTRAVENOUS | Status: DC | PRN
  Administered 2013-10-21: 20 mg via INTRAVENOUS
  Administered 2013-10-21: 150 mg via INTRAVENOUS

## 2013-10-21 MED ORDER — AMLODIPINE BESYLATE 5 MG PO TABS
5.0000 mg | ORAL_TABLET | Freq: Every day | ORAL | Status: DC
  Administered 2013-10-23 – 2013-11-02 (×10): 5 mg via ORAL
  Filled 2013-10-21 (×13): qty 1

## 2013-10-21 MED ORDER — ONDANSETRON HCL 4 MG/2ML IJ SOLN: 4.0000 mg | Freq: Four times a day (QID) | INTRAMUSCULAR | Status: DC | PRN

## 2013-10-21 MED ORDER — LABETALOL HCL 5 MG/ML IV SOLN
10.0000 mg | INTRAVENOUS | Status: DC | PRN
  Filled 2013-10-21: qty 4

## 2013-10-21 MED ORDER — HYDRALAZINE HCL 20 MG/ML IJ SOLN: 10.0000 mg | INTRAMUSCULAR | Status: DC | PRN

## 2013-10-21 MED ORDER — ACETAMINOPHEN 650 MG RE SUPP: 325.0000 mg | RECTAL | Status: DC | PRN

## 2013-10-21 SURGICAL SUPPLY — 62 items
ADH SKN CLS APL DERMABOND .7 (GAUZE/BANDAGES/DRESSINGS)
BANDAGE ESMARK 6X9 LF (GAUZE/BANDAGES/DRESSINGS) IMPLANT
BNDG CMPR 9X6 STRL LF SNTH (GAUZE/BANDAGES/DRESSINGS)
BNDG ESMARK 6X9 LF (GAUZE/BANDAGES/DRESSINGS)
CANISTER SUCTION 2500CC (MISCELLANEOUS) ×2 IMPLANT
CLIP TI MEDIUM 24 (CLIP) ×2 IMPLANT
CLIP TI WIDE RED SMALL 24 (CLIP) ×5 IMPLANT
COVER SURGICAL LIGHT HANDLE (MISCELLANEOUS) ×2 IMPLANT
CUFF TOURNIQUET SINGLE 24IN (TOURNIQUET CUFF) IMPLANT
CUFF TOURNIQUET SINGLE 34IN LL (TOURNIQUET CUFF) IMPLANT
CUFF TOURNIQUET SINGLE 44IN (TOURNIQUET CUFF) IMPLANT
DERMABOND ADVANCED (GAUZE/BANDAGES/DRESSINGS)
DERMABOND ADVANCED .7 DNX12 (GAUZE/BANDAGES/DRESSINGS) IMPLANT
DRAIN SNY WOU (WOUND CARE) IMPLANT
DRAPE WARM FLUID 44X44 (DRAPE) ×2 IMPLANT
DRAPE X-RAY CASS 24X20 (DRAPES) ×1 IMPLANT
DRSG COVADERM 4X10 (GAUZE/BANDAGES/DRESSINGS) ×1 IMPLANT
DRSG COVADERM 4X8 (GAUZE/BANDAGES/DRESSINGS) ×1 IMPLANT
ELECT REM PT RETURN 9FT ADLT (ELECTROSURGICAL) ×2
ELECTRODE REM PT RTRN 9FT ADLT (ELECTROSURGICAL) ×1 IMPLANT
EVACUATOR SILICONE 100CC (DRAIN) IMPLANT
GAUZE SPONGE 4X4 16PLY XRAY LF (GAUZE/BANDAGES/DRESSINGS) ×1 IMPLANT
GLOVE BIO SURGEON STRL SZ 6.5 (GLOVE) ×2 IMPLANT
GLOVE BIO SURGEON STRL SZ7.5 (GLOVE) ×2 IMPLANT
GLOVE BIOGEL PI IND STRL 7.0 (GLOVE) IMPLANT
GLOVE BIOGEL PI IND STRL 7.5 (GLOVE) IMPLANT
GLOVE BIOGEL PI INDICATOR 7.0 (GLOVE) ×4
GLOVE BIOGEL PI INDICATOR 7.5 (GLOVE) ×1
GLOVE SS BIOGEL STRL SZ 7 (GLOVE) IMPLANT
GLOVE SUPERSENSE BIOGEL SZ 7 (GLOVE) ×2
GOWN PREVENTION PLUS XLARGE (GOWN DISPOSABLE) ×4 IMPLANT
GOWN STRL NON-REIN LRG LVL3 (GOWN DISPOSABLE) ×6 IMPLANT
KIT BASIN OR (CUSTOM PROCEDURE TRAY) ×2 IMPLANT
KIT ROOM TURNOVER OR (KITS) ×2 IMPLANT
MARKER GRAFT CORONARY BYPASS (MISCELLANEOUS) IMPLANT
NS IRRIG 1000ML POUR BTL (IV SOLUTION) ×4 IMPLANT
PACK PERIPHERAL VASCULAR (CUSTOM PROCEDURE TRAY) ×2 IMPLANT
PAD ARMBOARD 7.5X6 YLW CONV (MISCELLANEOUS) ×4 IMPLANT
PADDING CAST COTTON 6X4 STRL (CAST SUPPLIES) IMPLANT
SET COLLECT BLD 21X3/4 12 (NEEDLE) ×1 IMPLANT
SPONGE SURGIFOAM ABS GEL 100 (HEMOSTASIS) IMPLANT
STAPLER SKIN PROX WIDE 3.9 (STAPLE) ×2 IMPLANT
STAPLER VISISTAT 35W (STAPLE) IMPLANT
STOPCOCK 4 WAY LG BORE MALE ST (IV SETS) ×1 IMPLANT
SUT PROLENE 5 0 C 1 24 (SUTURE) ×2 IMPLANT
SUT PROLENE 6 0 CC (SUTURE) ×11 IMPLANT
SUT PROLENE 7 0 BV 1 (SUTURE) ×2 IMPLANT
SUT PROLENE 7 0 BV1 MDA (SUTURE) ×2 IMPLANT
SUT SILK 2 0 SH (SUTURE) ×2 IMPLANT
SUT SILK 3 0 (SUTURE) ×2
SUT SILK 3-0 18XBRD TIE 12 (SUTURE) IMPLANT
SUT VIC AB 2-0 CTX 36 (SUTURE) ×4 IMPLANT
SUT VIC AB 3-0 SH 27 (SUTURE) ×8
SUT VIC AB 3-0 SH 27X BRD (SUTURE) ×2 IMPLANT
SUT VIC AB 4-0 PS2 27 (SUTURE) ×3 IMPLANT
TAPE UMBILICAL COTTON 1/8X30 (MISCELLANEOUS) IMPLANT
TOWEL OR 17X24 6PK STRL BLUE (TOWEL DISPOSABLE) ×5 IMPLANT
TOWEL OR 17X26 10 PK STRL BLUE (TOWEL DISPOSABLE) ×4 IMPLANT
TRAY FOLEY CATH 16FRSI W/METER (SET/KITS/TRAYS/PACK) ×2 IMPLANT
TUBING EXTENTION W/L.L. (IV SETS) ×1 IMPLANT
UNDERPAD 30X30 INCONTINENT (UNDERPADS AND DIAPERS) ×2 IMPLANT
WATER STERILE IRR 1000ML POUR (IV SOLUTION) ×2 IMPLANT

## 2013-10-21 NOTE — Anesthesia Procedure Notes (Signed)
Procedure Name: Intubation Date/Time: 10/21/2013 7:50 AM Performed by: Marni Griffon Pre-anesthesia Checklist: Patient identified, Emergency Drugs available, Suction available and Patient being monitored Patient Re-evaluated:Patient Re-evaluated prior to inductionOxygen Delivery Method: Circle system utilized Preoxygenation: Pre-oxygenation with 100% oxygen Intubation Type: IV induction Ventilation: Mask ventilation without difficulty Laryngoscope Size: Mac and 3 Grade View: Grade II Tube type: Oral Tube size: 7.5 mm Number of attempts: 1 Airway Equipment and Method: Stylet Placement Confirmation: breath sounds checked- equal and bilateral and positive ETCO2 (saw base of cords only) Secured at: 21 (cm at teeth) cm Tube secured with: Tape Dental Injury: Teeth and Oropharynx as per pre-operative assessment

## 2013-10-21 NOTE — Anesthesia Postprocedure Evaluation (Signed)
  Anesthesia Post-op Note  Patient: Jill Higgins  Procedure(s) Performed: Procedure(s): LEFT FEMORAL-POPLITEAL ARTERY BYPASS WITH SAPHENOUS VEIN GRAFT , POPLITEAL ENDARTERECTOMY ,INTRAOPERATIVE ARTERIOGRAM, vein patch angioplasty to popliteal artery (Left)  Patient Location: PACU  Anesthesia Type:General  Level of Consciousness: awake, oriented, sedated and patient cooperative  Airway and Oxygen Therapy: Patient Spontanous Breathing  Post-op Pain: mild  Post-op Assessment: Post-op Vital signs reviewed, Patient's Cardiovascular Status Stable, Respiratory Function Stable, Patent Airway, No signs of Nausea or vomiting and Pain level controlled  Post-op Vital Signs: stable  Complications: No apparent anesthesia complications

## 2013-10-21 NOTE — Progress Notes (Signed)
Pt b/p continued to remain 80's/40-50's despite two 500cc NS boluses. Dr. Myra Gianotti notified and new orders received. Will continue to monitor.

## 2013-10-21 NOTE — H&P (Signed)
[facility]  HISTORY AND PHYSICAL    CC: worsening pain in legs with walking - now life-limiting  History of Present Illness: Jill Higgins is a 67 y.o. female with hx DM, HTN and known PVD who has been having worsening claudication in both legs. Pt has had SFA PTA and stents which have occluded. She states the left leg is worse than the right.  She can walk only about 60 steps before she has pain. She also C/O pain in the legs with steps but denies night or rest pain. 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 She does have peripheral neuropathy with tingling in her feet. She has tried conservative management for several years and feels that at this point she wishes to proceed with bypass. She has previously been informed of the risks benefits and possible complications of a bypass procedure.  Repeat ABI's are essentially unchanged     Past Medical History   Diagnosis  Date   .  Substance abuse         Tobacco abuse   .  Hypertension     .  Hyperlipidemia     .  Peripheral vascular disease     .  Fibromyalgia     .  GERD (gastroesophageal reflux disease)     .  Depression with anxiety     .  Diabetes mellitus         Type II     ROS: [x]  Positive   [ ]  Denies     General: [ ]  Weight loss, [ ]  Fever, [ ]  chills Neurologic: [ ]  Dizziness, [ ]  Blackouts, [ ]  Seizure [ ]  Stroke, [ ]  "Mini stroke", [ ]  Slurred speech, [ ]  Temporary blindness; [ ]  weakness in arms or legs, [ ]  Hoarseness Cardiac: [ ]  Chest pain/pressure, [ ]  Shortness of breath at rest [ ]  Shortness of breath with exertion, [ ]  Atrial fibrillation or irregular heartbeat Vascular: [x ] Pain in legs with walking, [ ]  Pain in legs at rest, [ ]  Pain in legs at night,  [ ]  Non-healing ulcer, [ ]  Blood clot in vein/DVT,    Pulmonary: [ ]  Home oxygen, [ ]  Productive cough, [ ]  Coughing up blood, [ ]  Asthma,  [ ]   Wheezing Musculoskeletal:  [x ] Arthritis, [ ]  Low back pain, [ ]  Joint pain Hematologic: [ ]  Easy Bruising, [ ]  Anemia; [ ]  Hepatitis Gastrointestinal: [ ]  Blood in stool, [ ]  Gastroesophageal Reflux/heartburn, [ ]  Trouble swallowing Urinary: [ ]  chronic Kidney disease, [ ]  on HD - [ ]  MWF or [ ]  TTHS, [ ]  Burning with urination, [ ]  Difficulty urinating Skin: [ ]  Rashes, [ ]  Wounds Psychological: [ ]  Anxiety, [ ]  Depression   Social History History   Substance Use Topics   .  Smoking status:  Light Tobacco Smoker       Types:  Cigarettes   .  Smokeless tobacco:  Never Used         Comment: pt states that she is using the E cig only smokes 1-20 regular cigs per month   .  Alcohol Use:  No     Family History Family History   Problem  Relation  Age of Onset   .  Heart disease  Father     .  Heart attack  Father     .  Hypertension  Mother     .  Alzheimer's disease  Mother     .  Hypertension  Sister     .  Diabetes  Brother     .  Hyperlipidemia  Brother     .  Hypertension  Brother       No Known Allergies    Current Outpatient Prescriptions   Medication  Sig  Dispense  Refill   .  amLODipine-olmesartan (AZOR) 5-40 MG per tablet  Take 1 tablet by mouth daily.         Marland Kitchen  aspirin 81 MG tablet  Take 160 mg by mouth daily.         Marland Kitchen  atenolol (TENORMIN) 50 MG tablet  Take 50 mg by mouth daily.         .  B Complex-C (B-COMPLEX WITH VITAMIN C) tablet  Take 1 tablet by mouth daily.         .  calcium & magnesium carbonates (MYLANTA) 311-232 MG per tablet  Take 1 tablet by mouth daily.         .  clopidogrel (PLAVIX) 75 MG tablet  Take 75 mg by mouth daily.         Marland Kitchen  lidocaine-prilocaine (EMLA) cream  as needed.         Marland Kitchen  LORazepam (ATIVAN) 0.5 MG tablet  Take 0.5 mg by mouth as needed.         .  rosuvastatin (CRESTOR) 10 MG tablet  Take 5 mg by mouth daily.         .  sitaGLIPtan-metformin (JANUMET) 50-1000 MG per tablet  Take 1 tablet by mouth 2 (two) times daily with a  meal.         .  temazepam (RESTORIL) 30 MG capsule  Take 30 mg by mouth at bedtime as needed.         .  TRAVATAN Z 0.004 % SOLN ophthalmic solution  at bedtime.         .  cilostazol (PLETAL) 50 MG tablet  Take 50 mg by mouth 2 (two) times daily.         Marland Kitchen  ESZOPICLONE 3 MG tablet  Take by mouth daily.         .  Omeprazole Magnesium (PRILOSEC OTC PO)  Take by mouth as needed.            No current facility-administered medications for this visit.     Physical Examination    Filed Vitals:   10/21/13 0551  BP: 137/73  Pulse: 56  Temp: 97.3 F (36.3 C)  TempSrc: Oral  Resp: 18  SpO2: 100%   General:  WDWN in NAD Gait: Normal HENT: WNL Eyes: Pupils equal Pulmonary: normal non-labored breathing , without Rales, rhonchi,  wheezing Cardiac: RRR, without  Murmurs, rubs or gallops; No carotid bruits Abdomen: soft, NT, no masses Skin: no rashes, ulcers noted Vascular Exam/Pulses: 2+ palpable Femoral pulses Monophasic doppler signal in bilateral DP and PT  Extremities without ischemic changes, no Gangrene , no cellulitis; no open wounds;  Musculoskeletal: no muscle wasting or atrophy          Neurologic: A&O X 3; Appropriate Affect ; SENSATION: normal; MOTOR FUNCTION:  moving all extremities equally. Speech is fluent/normal  Non-Invasive Vascular Imaging: 08/07/2013 ABI's Right 0.69  Left 0.53  I reviewed and interpreted the patient's study  ASSESSMENT: Jill Higgins is a 67 y.o. female With worsening life-limiting claudication  left > right. Previous intervention of bilateral SFA stents which are now occluded   The patient has a chronic lower extremity claudication which has been progressively worse over the last several years. This point she wishes intervention for lifestyle limiting claudication. Left fem pop today.  Risks benefits complications discussed.  Fabienne Bruns, MD [facility] [place] Office: [phone] Pager: [phone]

## 2013-10-21 NOTE — Preoperative (Signed)
Beta Blockers   Reason not to administer Beta Blockers:Atenolol [phone]

## 2013-10-21 NOTE — Anesthesia Preprocedure Evaluation (Addendum)
Anesthesia Evaluation  Patient identified by MRN, date of birth, ID band Patient awake    Reviewed: Allergy & Precautions, H&P , NPO status , Patient's Chart, lab work & pertinent test results, reviewed documented beta blocker date and time   Airway Mallampati: I TM Distance: >3 FB Neck ROM: Full    Dental  (+) Teeth Intact, Caps, Missing and Dental Advisory Given   Pulmonary COPD COPD inhaler, Current Smoker,  Recent bronchitis Uses e-cigarettes         Cardiovascular hypertension, Pt. on home beta blockers + Peripheral Vascular Disease     Neuro/Psych PSYCHIATRIC DISORDERS  Neuromuscular disease    GI/Hepatic GERD-  Controlled,  Endo/Other  diabetes, Well Controlled, Type 2, Oral Hypoglycemic Agents  Renal/GU      Musculoskeletal  (+) Fibromyalgia -  Abdominal   Peds  Hematology   Anesthesia Other Findings   Reproductive/Obstetrics                          Anesthesia Physical Anesthesia Plan  ASA: III  Anesthesia Plan: General   Post-op Pain Management:    Induction: Intravenous  Airway Management Planned: Oral ETT  Additional Equipment:   Intra-op Plan:   Post-operative Plan: Extubation in OR and Possible Post-op intubation/ventilation  Informed Consent: I have reviewed the patients History and Physical, chart, labs and discussed the procedure including the risks, benefits and alternatives for the proposed anesthesia with the patient or authorized representative who has indicated his/her understanding and acceptance.     Plan Discussed with:   Anesthesia Plan Comments:         Anesthesia Quick Evaluation

## 2013-10-21 NOTE — Op Note (Signed)
Procedure: Left femoral to above-knee popliteal bypass with non-reversed greater saphenous vein, popliteal endarterectomy, vein patch angioplasty of popliteal artery, intraoperative arteriogram  Preoperative diagnosis: Left leg claudication  Postoperative diagnosis: Same  Anesthesia: Gen.  Assistant: Doreatha Massed PA-C, Lianne Cure PA-C  Operative findings: #1  2.5-3 mm greater saphenous vein #2 aberrant anatomy high takeoff of the anterior tibial artery posterior to the knee, two-vessel runoff  Operative details: After obtaining informed consent, the patient taken the operating room. The patient was placed in supine position on the operating table. After induction of general anesthesia and endotracheal intubation, the patient's entire left lower extremity was prepped and draped in usual sterile fashion. Next an oblique incision was made in the left groin carried onto the saphenous tissues down below a left common femoral artery. The artery was quite thickened on palpation. It did have a pulse within it. The common femoral artery had some adhesions anteriorly from previous catheterization. The artery was dissected free circumferentially all the way to level the inguinal ligament. The profunda femoris and superficial femoral arteries were also dissected free circumferentially. Vessel loops were placed around these. Next the greater saphenous vein was harvested through the medial portion of the incision. The vein was harvested through several skip incisions in the medial aspect of the leg. The vein was of reasonable but not outstanding quality. It was 2.5-3 mm in diameter throughout its course. The vein was harvested to the below-knee portion of the leg. The vein was ligated distally with a 3 0 silk tie and proximally with a 2-0 silk tie. The vein was then gently distended with heparinized saline inspected. It was adequate for use for bypass conduit. The vein was gently distended with heparinized  saline and all side branches inspected for hemostasis several 7 0 prolene sutures were used to close side branches. I then proceeded to dissect out the above-knee popliteal artery. I did not want to go below the knee if possible since the patient had high takeoff the tibial vessels above the knee it functionally this would have been a below-knee bypass graft. Therefore I dissected out the above-knee popliteal artery in the above-knee popliteal space. The sartorius muscle was reflected posteriorly. The artery was dissected free circumferentially. It again was quite thickened on palpation and nonpulsatile. To get to reasonable segment of the artery this required a fair amount of dissection in the deep posterior knee. Several geniculate branches were divided between silk ties or clips. I did not carry the dissection all the way down to the takeoff of the tibial vessels. Next a subsartorial tunnel was created from the above-knee popliteal space to the groin. The patient was then given 7000 units of heparin. She was given 2 additional boluses of heparin during the course of the case. The common femoral artery was controlled with a Cooley clamp. The profunda and superficial was controlled with vessel loops. A longitudinal opening was made in the common femoral artery. It was quite thickened on the inner surface. The vein was placed in the nonreversible configuration and spatulated. It was then sewn end of vein to side of artery using a running 6-0 Prolene suture. Just prior to completion of the anastomosis it was forebled backbled and thoroughly flushed. The anastomosis was secured clamps and the clamps released.  A valvulotome was then passed twice through the vein graft to make sure all valves were completely lysed. There was good flow through the vein graft. The vein graft was marked for orientation and brought through  the subsartorial tunnel to the above-knee popliteal space. The popliteal artery was controlled  basically at the level of the knee joint with a Henley clamp distally and proximally. A longitudinal opening was made in the popliteal artery. It was severely diseased. I had to perform an endarterectomy to have a reasonable surface for sewing it had a reasonable lumen. The vein was cut to length and sewn end of vein to side of artery using running 6-0 Prolene suture. Just prior to completion of the anastomosis it was forebled backbled and thoroughly flushed. The anastomosis was secured and clamps released. There was pulsatile flow in the popliteal artery immediately. There was good Doppler flow as well. The patient also had a posterior tibial and dorsalis pedis Doppler signal. Intraop arteriogram was then performed. A 20-gauge butterfly needle was introduced into the proximal vein graft. This was done with inflow occlusion. This showed a patent distal anastomosis but an area of dissection just below the anastomosis in the native popliteal artery. I did not believe that the graft would have long-term patency with this dissection. Therefore I dissected further down onto the above-knee popliteal artery further down below the knee joint. The artery was again controlled with Henley clamps. A longitudinal opening was made in the artery just below the previous anastomosis. There was a large intimal flap. This was trimmed back and then the posterior wall tacked. A segment of vein that had been left was then opened longitudinally and placed in a reverse configuration and sewn on as a patch angioplasty using a running 6-0 Prolene suture. Just prior to completion of the anastomosis it was forebled backbled and thoroughly flushed the anastomosis was secured and the clamps released. There was pulsatile flow in the popliteal artery immediately. The patient also had a dorsalis pedis and posterior tibial Doppler signal. This augmented approximately 40-50% with unclamping the graft. Hemostasis was obtained with a few repair  sutures. Next the subcutaneous tissues were reapproximated using running 3-0 Vicryl suture. The incisions excluding the groin incision were all closed then with staples. The groin was closed in multiple layers or running 3-0 Vicryl suture and a 4 0 Vicryl subcuticular stitch in the skin. The patient tolerated procedure well and there were no complications. Instrument sponge and needle counts were correct at the end of the case. The patient was taken to the recovery room in stable condition. If the bypass graft occludes. She will most likely need an anterior tibial artery bypass graft and she has limited conduit availability.  Fabienne Bruns, MD [facility] [place] Office: [phone] Pager: [phone]

## 2013-10-21 NOTE — Transfer of Care (Signed)
Immediate Anesthesia Transfer of Care Note  Patient: Jill Higgins  Procedure(s) Performed: Procedure(s): LEFT FEMORAL-POPLITEAL ARTERY BYPASS WITH SAPHENOUS VEIN GRAFT , POPLITEAL ENDARTERECTOMY ,INTRAOPERATIVE ARTERIOGRAM, vein patch angioplasty to popliteal artery (Left)  Patient Location: PACU  Anesthesia Type:General  Level of Consciousness: awake, alert , oriented and patient cooperative  Airway & Oxygen Therapy: Patient Spontanous Breathing and Patient connected to nasal cannula oxygen  Post-op Assessment: Report given to PACU RN, Post -op Vital signs reviewed and stable and Patient moving all extremities  Post vital signs: Reviewed and stable  Complications: No apparent anesthesia complications

## 2013-10-22 ENCOUNTER — Encounter (HOSPITAL_COMMUNITY): Payer: Self-pay | Admitting: *Deleted

## 2013-10-22 LAB — BASIC METABOLIC PANEL
BUN: 11 mg/dL (ref 6–23)
CO2: 25 mEq/L (ref 19–32)
Calcium: 7.7 mg/dL — ABNORMAL LOW (ref 8.4–10.5)
Chloride: 109 mEq/L (ref 96–112)
Creatinine, Ser: 0.67 mg/dL (ref 0.50–1.10)
GFR calc Af Amer: >90 mL/min (ref >90)
GFR calc non Af Amer: 88 mL/min — ABNORMAL LOW (ref >90)
Glucose, Bld: 149 mg/dL — ABNORMAL HIGH (ref 70–99)
Potassium: 4.2 mEq/L (ref 3.5–5.1)
Sodium: 141 mEq/L (ref 135–145)

## 2013-10-22 LAB — GLUCOSE, CAPILLARY
Glucose-Capillary: 159 mg/dL — ABNORMAL HIGH (ref 70–99)
Glucose-Capillary: 166 mg/dL — ABNORMAL HIGH (ref 70–99)
Glucose-Capillary: 124 mg/dL — ABNORMAL HIGH (ref 70–99)
Glucose-Capillary: 163 mg/dL — ABNORMAL HIGH (ref 70–99)
Glucose-Capillary: 143 mg/dL — ABNORMAL HIGH (ref 70–99)

## 2013-10-22 LAB — CBC
HCT: 21.2 % — ABNORMAL LOW (ref 36.0–46.0)
Hemoglobin: 7.3 g/dL — ABNORMAL LOW (ref 12.0–15.0)
MCH: 31.2 pg (ref 26.0–34.0)
MCHC: 34.4 g/dL (ref 30.0–36.0)
MCV: 90.6 fL (ref 78.0–100.0)
Platelets: 210 10*3/uL (ref 150–400)
RBC: 2.34 MIL/uL — ABNORMAL LOW (ref 3.87–5.11)
RDW: 13.7 % (ref 11.5–15.5)
WBC: 9.5 10*3/uL (ref 4.0–10.5)

## 2013-10-22 LAB — PREPARE RBC (CROSSMATCH)

## 2013-10-22 MED ORDER — HYDROMORPHONE HCL PF 1 MG/ML IJ SOLN
0.5000 mg | INTRAMUSCULAR | Status: DC | PRN
  Administered 2013-10-22 – 2013-10-23 (×2): 0.5 mg via INTRAVENOUS
  Administered 2013-10-23 – 2013-10-28 (×2): 1 mg via INTRAVENOUS
  Administered 2013-10-29: 0.5 mg via INTRAVENOUS
  Administered 2013-10-29: 1 mg via INTRAVENOUS
  Administered 2013-10-30: 0.5 mg via INTRAVENOUS
  Administered 2013-10-30: 1 mg via INTRAVENOUS
  Administered 2013-10-30: 0.5 mg via INTRAVENOUS
  Administered 2013-10-31 – 2013-11-02 (×6): 1 mg via INTRAVENOUS
  Filled 2013-10-22 (×14): qty 1

## 2013-10-22 MED ORDER — FUROSEMIDE 10 MG/ML IJ SOLN
20.0000 mg | Freq: Once | INTRAMUSCULAR | Status: AC
  Administered 2013-10-22: 20 mg via INTRAVENOUS

## 2013-10-22 MED ORDER — FUROSEMIDE 10 MG/ML IJ SOLN
INTRAMUSCULAR | Status: AC
  Filled 2013-10-22: qty 4

## 2013-10-22 MED ORDER — ENOXAPARIN SODIUM 40 MG/0.4ML ~~LOC~~ SOLN: 40.0000 mg | SUBCUTANEOUS | Status: DC

## 2013-10-22 NOTE — Progress Notes (Signed)
Patient has received 650mg  tylenol po

## 2013-10-22 NOTE — Evaluation (Signed)
Physical Therapy Evaluation Patient Details Name: Jill Higgins MRN: [id number] DOB: [birth year] Today's Date: 10/22/2013 Time: 6578-4696 PT Time Calculation (min): 31 min  PT Assessment / Plan / Recommendation History of Present Illness  Jill Higgins is a 67 y.o. female with hx DM, HTN and known PVD who has been having worsening claudication in both legs. Pt has had SFA PTA and stents which have occluded. She states the left leg is worse than the right. She can walk only about 60 steps before she has pain. She also C/O pain in the legs with steps but denies night or rest pain. 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 She does have peripheral neuropathy with tingling in her feet. She tried conservative management for several years prior to having the bypass.   Clinical Impression  Patient was in considerable amount of pain today (7/10) in the left LE especially around the sutures. She needed multiple cues but successfully transferred from the bed to chair with moderate assistance of 2 and use of the RW. She would benefit from continued PT services acutely to address deficits listed below, to increase independence, and to decrease caregiver burden. Given her level of functioning and mobility today, recommendations for 24/7 supervision in a SNF for short-term rehabilitation would be the safest option upon d/c. Will continue to assess her progress acutely as patient may function well enough for home health PT instead. Her hopes are to be sent home in the care of her daughter, either to the daughter's 2-story home, or to her own apartment where the daughter will stay temporarily with her.    PT Assessment  Patient needs continued PT services    Follow Up Recommendations  SNF (for short term rehab)    Does the patient have the potential to tolerate intense rehabilitation      Barriers to Discharge Decreased caregiver  support not sure if family can give level of support she needs currently    Equipment Recommendations  Rolling walker with 5" wheels    Recommendations for Other Services     Frequency Min 3X/week    Precautions / Restrictions Precautions Precautions: Fall Restrictions Weight Bearing Restrictions: No   Pertinent Vitals/Pain Patient reports pain is down from a reported 10/10 yesterday to a 7/10 in the left LE today. VSS, on 2L O2..      Mobility  Bed Mobility Bed Mobility: Supine to Sit;Sitting - Scoot to Edge of Bed Supine to Sit: 4: Min assist;HOB elevated Sitting - Scoot to [employer] of Bed: 4: Min assist Details for Bed Mobility Assistance: needed assistance with LLE; cues for UE placement and usage; cues to scoot to EOB Transfers Transfers: Sit to Stand;Stand to [employer] Transfers Sit to Stand: 3: Mod assist;With upper extremity assist;From bed Stand to Sit: 3: Mod assist;With upper extremity assist;To chair/3-in-1 Stand Pivot Transfers: 2: Max assist Details for Transfer Assistance: cues for LE sequencing and foot placement with decreased LLE strength; cues to use bilateral UE to unload LEs and for UE placement; assist for safety and general weakness Ambulation/Gait Ambulation/Gait Assistance: Not tested (comment)    Exercises     PT Diagnosis: Generalized weakness;Acute pain;Difficulty walking  PT Problem List: Decreased strength;Decreased activity tolerance;Decreased mobility;Decreased knowledge of use of DME PT Treatment Interventions: DME instruction;Gait training;Stair training;Functional mobility training;Therapeutic activities     PT Goals(Current goals can be found in the care plan section)  Acute Rehab PT Goals Patient Stated Goal: 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 female with hx DM, HTN and known PVD who has been having worsening claudication in both legs. Pt has had SFA PTA and stents which have occluded. She states the left leg is worse than the right. She can walk only about 60 steps before she has pain. She also C/O pain in the legs with steps but denies night or rest pain. 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 She does have peripheral neuropathy with tingling in her feet. She tried conservative management for several years prior to having the bypass.        Prior Functioning  Home Living Family/patient expects to be discharged to:: Private residence Living Arrangements: Alone (Plans to live with her daughter while recovering.) Available Help at Discharge: Available PRN/intermittently Type of Home: House Home Access: Stairs to enter Entrance Stairs-Number of Steps: 1 Entrance Stairs-Rails: None Home Layout: Two level Home Equipment: None Prior Function Level of Independence: Independent Communication Communication: No difficulties    Cognition  Cognition Arousal/Alertness: Awake/alert Behavior During Therapy: WFL for tasks assessed/performed Overall Cognitive Status: Within Functional Limits for tasks assessed    Extremity/Trunk Assessment Upper Extremity Assessment Upper Extremity Assessment: Overall WFL for tasks assessed Lower Extremity Assessment Lower Extremity Assessment: Generalized weakness;LLE deficits/detail LLE Deficits / Details: very tender at surgical site LLE: Unable to fully assess due to pain   Balance    End of Session PT - End of Session Equipment Utilized During Treatment: Gait belt;Oxygen Activity Tolerance: Patient limited by pain Patient left: in chair;with call bell/phone within reach;with nursing/sitter in room Nurse Communication: Mobility  status  GP     Willette Pa, SPT 10/22/2013, 4:52 PM

## 2013-10-22 NOTE — Progress Notes (Addendum)
[facility] Progress Note  10/22/2013 7:58 AM 1 Day Post-Op  Subjective:  Crying with pain-states when asked about pain medication that has worked for her in the past "I've never had this much pain".  Tm 101.1 -now 80's-120's systolic--on dopamine (down to 2 mcg) HR 50's-80's regular 99% 2LO2NC  (de sat to 83% while taking bandage down) quickly recovered with slow deep breaths.  Filed Vitals:   10/22/13 0336  BP: 108/57  Pulse: 79  Temp: 98.5 F (36.9 C)  Resp: 16    Physical Exam: Incisions:  Staples in place on left thigh-minimal drainage, but old bloody drainage on bandage Extremities:  + doppler signals in left PT/DP/peroneal; left foot is warm.  A couple small skin tears from removing bandage on left lateral portion of bandage. Cardiac:  Regular Lungs:  Non labored with O2 in place at 2L  CBC    Component Value Date/Time   WBC 9.5 10/22/2013 0040   RBC 2.34* 10/22/2013 0040   HGB 7.3* 10/22/2013 0040   HCT 21.2* 10/22/2013 0040   PLT 210 10/22/2013 0040   MCV 90.6 10/22/2013 0040   MCH 31.2 10/22/2013 0040   MCHC 34.4 10/22/2013 0040   RDW 13.7 10/22/2013 0040   LYMPHSABS 2.8 09/26/2007 0843   MONOABS 0.4 09/26/2007 0843   EOSABS 0.2 09/26/2007 0843   BASOSABS 0.0 09/26/2007 0843    BMET    Component Value Date/Time   NA 141 10/22/2013 0040   K 4.2 10/22/2013 0040   CL 109 10/22/2013 0040   CO2 25 10/22/2013 0040   GLUCOSE 149* 10/22/2013 0040   BUN 11 10/22/2013 0040   CREATININE 0.67 10/22/2013 0040   CALCIUM 7.7* 10/22/2013 0040   GFRNONAA 88* 10/22/2013 0040   GFRAA >90 10/22/2013 0040    INR    Component Value Date/Time   INR 0.88 09/29/2013 1358     Intake/Output Summary (Last 24 hours) at 10/22/13 0758 Last data filed at 10/22/13 0400  Gross per 24 hour  Intake 5572.6 ml  Output   1430 ml  Net 4142.6 ml     Assessment:  67 y.o. female is s/p:  Left femoral to above-knee popliteal bypass with non-reversed greater saphenous vein,  popliteal endarterectomy, vein patch angioplasty of popliteal artery, intraoperative arteriogram  1 Day Post-Op  Plan: -bypass graft is patent with audible doppler signals in left foot -DVT prophylaxis:  Lovenox to start today -acute surgical blood loss anemia requiring dopamine for BP support-will transfuse 2 PRBC's.   -fever this am of 101.1 and most likely due to atelectasis-will order Incentive spirometer;  U/A about 3 weeks ago was negative. -will hold on starting Lovenox today and start tomorrow. -ck CBC in am -BUN/Cr WNL after intraoperative arteriogram. -change morphine to dilaudid -keep in stepdown today.    Doreatha Massed, PA-C [facility] [phone] 10/22/2013 7:58 AM    Left thigh hematoma.  Acute blood loss anemia will transfuse.  Try to get pain under better control.  Tolerate MAP of 85. Agree with staying in stepdown.  Brisk DP and peroneal.  May need I and D of leg will recheck later today after transfusion and improved pain control.  Wean Dopamine.  NO Lovenox.  SCDs both legs ok  Fabienne Bruns, MD [facility] [place] Office: [phone] Pager: [phone]

## 2013-10-22 NOTE — Progress Notes (Signed)
Pt's BPs 80s-90/40s. L leg dressing has new drainage, L leg is swollen and feels tight. Pt states she can barely move leg. Pt also says her pain level is a 10/10 and describes it to be a tight/burning pain. Hgb 7.8 on post op CBC. Pulses are still dopplerable. Dr. Myra Gianotti notified. No new orders at this time. Will continue to monitor closely.

## 2013-10-22 NOTE — Progress Notes (Signed)
OT Cancellation Note  Patient Details Name: Jill Higgins MRN: [id number] DOB: 06/19/1946   Cancelled Treatment:    Reason Eval/Treat Not Completed: Medical issues which prohibited therapy Low Hgb. Receiving blood. Nsg asked to hold. Will see tomorrow as pt is able to participate. [facility] [name], OTR/L  [phone] 10/22/2013 10/22/2013, 11:42 AM

## 2013-10-22 NOTE — Progress Notes (Signed)
Utilization review completed.  

## 2013-10-22 NOTE — Evaluation (Signed)
Physical Therapy Evaluation Patient Details Name: Jill Higgins MRN: [id number] DOB: 07-27-1946 Today's Date: 10/22/2013 Time: 4098-1191 PT Time Calculation (min): 31 min  PT Assessment / Plan / Recommendation History of Present Illness  Jill Higgins is a 67 y.o. female with hx DM, HTN and known PVD who has been having worsening claudication in both legs. Pt has had SFA PTA and stents which have occluded. She states the left leg is worse than the right. She can walk only about 60 steps before she has pain. She also C/O pain in the legs with steps but denies night or rest pain. 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 She does have peripheral neuropathy with tingling in her feet. She tried conservative management for several years prior to having the bypass.   Clinical Impression  Patient was in considerable amount of pain today (7/10) in the left LE especially around the sutures. She needed multiple cues but successfully transferred from the bed to chair with moderate assistance of 2 and use of the RW. She would benefit from continued PT services acutely to address deficits listed below, to increase independence, and to decrease caregiver burden. Given her level of functioning and mobility today, recommendations for 24/7 supervision in a SNF for short-term rehabilitation would be the safest option upon d/c. Will continue to assess her progress acutely as patient may function well enough for home health PT instead. Her hopes are to be sent home in the care of her daughter, either to the daughter's 2-story home, or to her own apartment where the daughter will stay temporarily with her.    PT Assessment  Patient needs continued PT services    Follow Up Recommendations  SNF (for short term rehab)    Does the patient have the potential to tolerate intense rehabilitation      Barriers to Discharge Decreased caregiver  support not sure if family can give level of support she needs currently    Equipment Recommendations  Rolling walker with 5" wheels    Recommendations for Other Services     Frequency Min 3X/week    Precautions / Restrictions Precautions Precautions: Fall Restrictions Weight Bearing Restrictions: No   Pertinent Vitals/Pain Patient reports pain is down from a reported 10/10 yesterday to a 7/10 in the left LE today. VSS, on 2L O2..      Mobility  Bed Mobility Bed Mobility: Supine to Sit;Sitting - Scoot to Edge of Bed Supine to Sit: 4: Min assist;HOB elevated Sitting - Scoot to [employer] of Bed: 4: Min assist Details for Bed Mobility Assistance: needed assistance with LLE; cues for UE placement and usage; cues to scoot to EOB Transfers Transfers: Sit to Stand;Stand to [employer] Transfers Sit to Stand: 3: Mod assist;With upper extremity assist;From bed Stand to Sit: 3: Mod assist;With upper extremity assist;To chair/3-in-1 Stand Pivot Transfers: 2: Max assist Details for Transfer Assistance: cues for LE sequencing and foot placement with decreased LLE strength; cues to use bilateral UE to unload LEs and for UE placement; assist for safety and general weakness Ambulation/Gait Ambulation/Gait Assistance: Not tested (comment)    Exercises     PT Diagnosis: Generalized weakness;Acute pain;Difficulty walking  PT Problem List: Decreased strength;Decreased activity tolerance;Decreased mobility;Decreased knowledge of use of DME PT Treatment Interventions: DME instruction;Gait training;Stair training;Functional mobility training;Therapeutic activities     PT Goals(Current goals can be found in the care plan section)  Acute Rehab PT Goals Patient Stated Goal: 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 female with hx DM, HTN and known PVD who has been having worsening claudication in both legs. Pt has had SFA PTA and stents which have occluded. She states the left leg is worse than the right. She can walk only about 60 steps before she has pain. She also C/O pain in the legs with steps but denies night or rest pain. 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 She does have peripheral neuropathy with tingling in her feet. She tried conservative management for several years prior to having the bypass.        Prior Functioning  Home Living Family/patient expects to be discharged to:: Private residence Living Arrangements: Alone (Plans to live with her daughter while recovering.) Available Help at Discharge: Available PRN/intermittently Type of Home: House Home Access: Stairs to enter Entrance Stairs-Number of Steps: 1 Entrance Stairs-Rails: None Home Layout: Two level Home Equipment: None Prior Function Level of Independence: Independent Communication Communication: No difficulties    Cognition  Cognition Arousal/Alertness: Awake/alert Behavior During Therapy: WFL for tasks assessed/performed Overall Cognitive Status: Within Functional Limits for tasks assessed    Extremity/Trunk Assessment Upper Extremity Assessment Upper Extremity Assessment: Overall WFL for tasks assessed Lower Extremity Assessment Lower Extremity Assessment: Generalized weakness;LLE deficits/detail LLE Deficits / Details: very tender at surgical site LLE: Unable to fully assess due to pain   Balance    End of Session PT - End of Session Equipment Utilized During Treatment: Gait belt;Oxygen Activity Tolerance: Patient limited by pain Patient left: in chair;with call bell/phone within reach;with nursing/sitter in room Nurse Communication: Mobility  status  GP     Willette Pa, SPT 10/22/2013, 4:52 PM  Agree with above assessment.  Lewis Shock, PT, DPT Pager #: [phone] Office #: [phone]

## 2013-10-23 LAB — CBC
HCT: 24.8 % — ABNORMAL LOW (ref 36.0–46.0)
Hemoglobin: 8.7 g/dL — ABNORMAL LOW (ref 12.0–15.0)
MCH: 30.5 pg (ref 26.0–34.0)
MCHC: 35.1 g/dL (ref 30.0–36.0)
MCV: 87 fL (ref 78.0–100.0)
Platelets: 152 10*3/uL (ref 150–400)
RBC: 2.85 MIL/uL — ABNORMAL LOW (ref 3.87–5.11)
RDW: 13.8 % (ref 11.5–15.5)
WBC: 9.6 10*3/uL (ref 4.0–10.5)

## 2013-10-23 LAB — TYPE AND SCREEN
ABO/RH(D): A POS
Antibody Screen: NEGATIVE
Unit division: 0
Unit division: 0

## 2013-10-23 LAB — GLUCOSE, CAPILLARY
Glucose-Capillary: 149 mg/dL — ABNORMAL HIGH (ref 70–99)
Glucose-Capillary: 158 mg/dL — ABNORMAL HIGH (ref 70–99)
Glucose-Capillary: 156 mg/dL — ABNORMAL HIGH (ref 70–99)

## 2013-10-23 NOTE — Progress Notes (Signed)
Pt report called to 2W RN, VSS, meds current to time, belongs packed and take to new room. Pain med provided prior to transfer-new RN aware.

## 2013-10-23 NOTE — Care Management Note (Signed)
    Page 1 of 2   11/03/2013     4:49:56 PM   CARE MANAGEMENT NOTE 11/03/2013  Patient:  Jill Higgins, Jill Higgins   Account Number:  [id number]  Date Initiated:  10/22/2013  Documentation initiated by:  Donn Pierini  Subjective/Objective Assessment:   Pt admitted s/p fempop bypass graft     Action/Plan:   PTA pt lived at home alone- PT/OT evals ordered and pending- NCM to follow for recommendations   Anticipated DC Date:  11/03/2013   Anticipated DC Plan:  HOME W HOME HEALTH SERVICES  In-house referral  Clinical Social Worker      DC Associate Professor  CM consult      [facility] Choice  HOME HEALTH   Choice offered to / List presented to:  C-1 Patient        HH arranged  HH-2 PT      Status of service:  Completed, signed off Medicare Important Message given?   (If response is "NO", the following Medicare IM given date fields will be blank) Date Medicare IM given:   Date Additional Medicare IM given:    Discharge Disposition:  HOME W HOME HEALTH SERVICES  Per UR Regulation:  Reviewed for med. necessity/level of care/duration of stay  If discussed at Long Length of Stay Meetings, dates discussed:   10/28/2013  10/30/2013    Comments:  11/03/13 Jill Cadieux,RN,BSN [phone] PT FOR DC HOME TODAY.  WILL NEED HH FOLLOW UP.  REFERRAL TO [facility], PER PT CHOICE.  START OF CARE 24-48H POST DC DATE.  PT HAS RW AT HOME, AND SAFETY EQUIPPED SHOWER AND TOILET.  10/28/13 Jill Argo,RN,BSN [phone] PT RETURNED TO OR FOR LT FEM POP DEBRIDEMENT.  WILL FOLLOW FOR DC NEEDS.  CSW TO FOLLOW FOR POSSIBLE SNF PLACEMENT.  10/27/13 Jill Pavia,RN,BSN [phone] MD ORDERED HHPT FOLLOW UP AT DC.  PT STATES SHE PLANS TO DC WITH A GOOD FRIEND OF HERS AT DC.  LEFT [place] LIST OF HH PROVIDERS WITH PT; SHE WISHES TO HAVE HER DAUGHTER TO REVIEW WITH HER LATER WHEN SHE VISITS.  WILL FOLLOW UP WITH PT IN AM.  PT/OT CONT TO RECOMMEND SNF AT DC, THOUGH THIS MAY BE DUE TO FACT THAT PT LIVES ALONE, AND THEY  ARE NOT AWARE OF HER PLANS.  WILL HAVE CSW ADDRESS SNF WITH PT, THOUGH UNLIKELY THAT SHE WILL WANT THIS.  10/23/13- 1100- Donn Pierini RN, BSN [phone] Per PT/OT evals recommendations for SNF- CSW consulted for placement needs and will f/u with pt and family.

## 2013-10-23 NOTE — Progress Notes (Addendum)
[facility] Progress Note  10/23/2013 7:52 AM 2 Days Post-Op  Subjective:  Very sore  Tm 100.8 now 100.3 90's-120's systolic HR  60's-70's regular 93% 2LO2NC   Filed Vitals:   10/23/13 0733  BP: 125/68  Pulse: 76  Temp: 100 F (37.8 C)  Resp: 15    Physical Exam: Incisions:  Staples in tact; there is some blistering around mid incision and posteriorly Extremities:  Left foot is warm and well perfused.  Audible doppler signals in the DP/PT  Lungs:  Non-labored Cardiac:  regular  CBC    Component Value Date/Time   WBC 9.6 10/23/2013 0400   RBC 2.85* 10/23/2013 0400   HGB 8.7* 10/23/2013 0400   HCT 24.8* 10/23/2013 0400   PLT 152 10/23/2013 0400   MCV 87.0 10/23/2013 0400   MCH 30.5 10/23/2013 0400   MCHC 35.1 10/23/2013 0400   RDW 13.8 10/23/2013 0400   LYMPHSABS 2.8 09/26/2007 0843   MONOABS 0.4 09/26/2007 0843   EOSABS 0.2 09/26/2007 0843   BASOSABS 0.0 09/26/2007 0843    BMET    Component Value Date/Time   NA 141 10/22/2013 0040   K 4.2 10/22/2013 0040   CL 109 10/22/2013 0040   CO2 25 10/22/2013 0040   GLUCOSE 149* 10/22/2013 0040   BUN 11 10/22/2013 0040   CREATININE 0.67 10/22/2013 0040   CALCIUM 7.7* 10/22/2013 0040   GFRNONAA 88* 10/22/2013 0040   GFRAA >90 10/22/2013 0040    INR    Component Value Date/Time   INR 0.88 09/29/2013 1358     Intake/Output Summary (Last 24 hours) at 10/23/13 0752 Last data filed at 10/23/13 0345  Gross per 24 hour  Intake    995 ml  Output   3700 ml  Net  -2705 ml     Assessment:  67 y.o. female is s/p:  Left femoral to above-knee popliteal bypass with non-reversed greater saphenous vein, popliteal endarterectomy, vein patch angioplasty of popliteal artery, intraoperative arteriogram   2 Days Post-Op  Plan: -acute surgical blood loss anemia improved with PRBC's. -DVT prophylaxis:  Will not start Lovenox due to bleeding into thigh post operatively-use SCD's.  Okay to place on left leg. -will transfer to  2000 -ck CBC in am -still with low grade fever-continue incentive spirometry.  If still present tomorrow, will obtain cxr and U/A -keep foley today as pt is not mobile enough to get to restroom.   Doreatha Massed, PA-C [facility] [phone] 10/23/2013 7:52 AM    Left thigh hematoma overall stable.  Hemoglobin improved with transfusion.  Some tape blisters from dressing.  Brisk doppler flow to left foot. DP > PT, has AT peroneal runoff. Avoid anticoagulation in light of hematoma.  Continue SCDs.  Tranfer to 2000.  Try to start walking some. Fever without leukocytosis most likely atelectasis. Will start check other sources if persists tomorrow.  Fabienne Bruns, MD [facility] [place] Office: [phone] Pager: [phone]

## 2013-10-23 NOTE — Progress Notes (Signed)
VASCULAR LAB PRELIMINARY  ARTERIAL  ABI completed:    RIGHT    LEFT    PRESSURE WAVEFORM  PRESSURE WAVEFORM  BRACHIAL  triphasic BRACHIAL 107 triphasic  DP   DP    AT 63 Dampened monophasic AT 65  monophasic  PT 54 Dampened monophasic PT 56 monophasic  PER   PER    GREAT TOE  NA GREAT TOE  NA    RIGHT LEFT  ABI 0.59 0.61     Jabin Tapp, RVT 10/23/2013, 4:15 PM

## 2013-10-23 NOTE — Progress Notes (Signed)
OT Cancellation Note  Patient Details Name: Jill Higgins MRN: [id number] DOB: Jul 26, 1946   Cancelled Treatment:    Reason Eval/Treat Not Completed: Patient at procedure or test/ unavailable;Other (comment) Attempted to see earlier. Being seen by vascular. Will see later this pm or in am.  [facility] Tremain Rucinski, OTR/L  [phone] 10/23/2013 10/23/2013, 4:43 PM

## 2013-10-24 ENCOUNTER — Telehealth: Payer: Self-pay | Admitting: Vascular Surgery

## 2013-10-24 LAB — CBC
HCT: 24.9 % — ABNORMAL LOW (ref 36.0–46.0)
Hemoglobin: 8.6 g/dL — ABNORMAL LOW (ref 12.0–15.0)
MCH: 30.7 pg (ref 26.0–34.0)
MCHC: 34.5 g/dL (ref 30.0–36.0)
MCV: 88.9 fL (ref 78.0–100.0)
Platelets: 154 10*3/uL (ref 150–400)
RBC: 2.8 MIL/uL — ABNORMAL LOW (ref 3.87–5.11)
RDW: 14.1 % (ref 11.5–15.5)
WBC: 10 10*3/uL (ref 4.0–10.5)

## 2013-10-24 LAB — GLUCOSE, CAPILLARY
Glucose-Capillary: 144 mg/dL — ABNORMAL HIGH (ref 70–99)
Glucose-Capillary: 138 mg/dL — ABNORMAL HIGH (ref 70–99)
Glucose-Capillary: 131 mg/dL — ABNORMAL HIGH (ref 70–99)
Glucose-Capillary: 130 mg/dL — ABNORMAL HIGH (ref 70–99)
Glucose-Capillary: 199 mg/dL — ABNORMAL HIGH (ref 70–99)

## 2013-10-24 NOTE — Progress Notes (Signed)
Physical Therapy Treatment Patient Details Name: Jill Higgins MRN: [id number] DOB: 1946-05-12 Today's Date: 10/24/2013 Time: 3086-5784 PT Time Calculation (min): 26 min  PT Assessment / Plan / Recommendation  History of Present Illness Adm for Lt fem-pop bypass after failed PTCA and stents. Post-op Lt thigh hematoma. Could only walk ~60 steps PTA due to pain. PMHx DM, HTN   PT Comments   Pt admits to not moving her leg because it hurts. Pt with very limited ROM due to pain and edema. Increased time spent on education re: need to perform AROM exercises and increase overall mobility. Pt tolerated session much better than previously.    Follow Up Recommendations  SNF (for short term rehab)     Does the patient have the potential to tolerate intense rehabilitation     Barriers to Discharge        Equipment Recommendations  Rolling walker with 5" wheels    Recommendations for Other Services    Frequency Min 3X/week   Progress towards PT Goals Progress towards PT goals: Progressing toward goals  Plan Current plan remains appropriate    Precautions / Restrictions Precautions Precautions: Fall Restrictions Weight Bearing Restrictions: No   Pertinent Vitals/Pain 3-4/10 LLE pre- and post-activity patient repositioned for comfort     Mobility  Bed Mobility Bed Mobility: Supine to Sit;Sitting - Scoot to Edge of Bed Supine to Sit: 1: +2 Total assist;HOB elevated;With rails Supine to Sit: Patient Percentage: 40% Sitting - Scoot to Edge of Bed: 4: Min assist Details for Bed Mobility Assistance: pt supine with leg on pillow, fully externally rotated and flexed at knee for comfort; after ROM exercises, pt still minimally able to move LLE on her own, flinching when bending RLE and poor use of UEs to come up to sitting; once sitting, incr time and cues to scoot to EOB Transfers Transfers: Sit to Stand;Stand to Sit Sit to Stand: 3: Mod assist;With upper extremity assist Stand to Sit: 3:  Mod assist;With upper extremity assist Details for Transfer Assistance: RW stabilized for her to place one UE on RW to push up, with other hand on the bed; pt with good use of RLE to come to stand Ambulation/Gait Ambulation/Gait Assistance: 1: +2 Total assist Ambulation/Gait: Patient Percentage: 60% Ambulation Distance (Feet): 5 Feet Assistive device: Rolling walker Ambulation/Gait Assistance Details: pt required constant cues for technique and initially assist to advance her RIGHT leg (due to self-limiting her weight bearing on LLE); pt ultimately was advancing each leg herself; followed with chair Gait Pattern: Step-to pattern;Decreased stride length;Decreased weight shift to left;Left foot flat;Right foot flat;Antalgic Gait velocity: extremely slow    Exercises General Exercises - Lower Extremity Ankle Circles/Pumps: AROM;Left;20 reps;Supine;Seated Quad Sets: AROM;Left;10 reps;Supine Heel Slides: AAROM;Left;5 reps;Supine Other Exercises Other Exercises: AAROM internal rotation Lt hip while in supine and in recliner; able to achieve neutral with prolonged stretching and contract relax methods Other Exercises: pt educated to do ankle pumps and quad sets (with knee and toes pointing up towards ceiling) every hour    PT Diagnosis:    PT Problem List:   PT Treatment Interventions:     PT Goals (current goals can now be found in the care plan section) Acute Rehab PT Goals Patient Stated Goal: 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 Post-op Lt thigh hematoma. Could only walk ~60 steps PTA due to pain. 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

## 2013-10-24 NOTE — Progress Notes (Addendum)
[facility] Progress Note  10/24/2013 7:23 AM 3 Days Post-Op  Subjective:  States she is feeling better this am.  Afebrile x 24 hrs 90's-100's systolic HR 70's regular 96% 1LO2NC  Filed Vitals:   10/24/13 0547  BP: 100/58  Pulse: 63  Temp: 98.6 F (37 C)  Resp: 16    Physical Exam: Incisions:  Staples in tact; left groin incision is c/d/i.  There is still blistering on thigh around incisions. Extremities:  + audible left PT/DP  CBC    Component Value Date/Time   WBC 10.0 10/24/2013 0346   RBC 2.80* 10/24/2013 0346   HGB 8.6* 10/24/2013 0346   HCT 24.9* 10/24/2013 0346   PLT 154 10/24/2013 0346   MCV 88.9 10/24/2013 0346   MCH 30.7 10/24/2013 0346   MCHC 34.5 10/24/2013 0346   RDW 14.1 10/24/2013 0346   LYMPHSABS 2.8 09/26/2007 0843   MONOABS 0.4 09/26/2007 0843   EOSABS 0.2 09/26/2007 0843   BASOSABS 0.0 09/26/2007 0843    BMET    Component Value Date/Time   NA 141 10/22/2013 0040   K 4.2 10/22/2013 0040   CL 109 10/22/2013 0040   CO2 25 10/22/2013 0040   GLUCOSE 149* 10/22/2013 0040   BUN 11 10/22/2013 0040   CREATININE 0.67 10/22/2013 0040   CALCIUM 7.7* 10/22/2013 0040   GFRNONAA 88* 10/22/2013 0040   GFRAA >90 10/22/2013 0040    INR    Component Value Date/Time   INR 0.88 09/29/2013 1358     Intake/Output Summary (Last 24 hours) at 10/24/13 0723 Last data filed at 10/24/13 0554  Gross per 24 hour  Intake      0 ml  Output   1025 ml  Net  -1025 ml   ABI's:  RIGHT    LEFT     PRESSURE  WAVEFORM   PRESSURE  WAVEFORM   BRACHIAL   triphasic  BRACHIAL  107  triphasic   DP    DP     AT  63  Dampened monophasic  AT  65  monophasic   PT  54  Dampened monophasic  PT  56  monophasic   PER    PER     GREAT TOE   NA  GREAT TOE   NA     RIGHT  LEFT   ABI  0.59  0.61       Assessment:  67 y.o. female is s/p:  Left femoral to above-knee popliteal bypass with non-reversed greater saphenous vein, popliteal endarterectomy, vein patch angioplasty  of popliteal artery, intraoperative arteriogram   3 Days Post-Op  Plan: -pt bypass graft is patent. -needs to mobilize today-OOB to chair and PT -after mobilizing this am, will d/c the foley -DVT prophylaxis:  Will not start Lovenox due to bleeding into thigh post operatively-use SCD's -Afebrile x 24 hrs with normal WBC -acute surgical blood loss anemia is stable   Doreatha Massed, PA-C [facility] [phone] 10/24/2013 7:23 AM  Agree with above assessment 2+ dorsalis pedis pulse palpable today in left foot  hematoma in left thigh wounds healing satisfactorily The patient has not ambulated yet   Plan ambulation today and hopefully DC patient in a.m.

## 2013-10-24 NOTE — Evaluation (Signed)
Occupational Therapy Evaluation Patient Details Name: Jill Higgins MRN: [id number] DOB: Sep 03, 1946 Today's Date: 10/24/2013 Time: 8657-8469 OT Time Calculation (min): 29 min  OT Assessment / Plan / Recommendation History of present illness Adm for Lt fem-pop bypass after failed PTCA and stents. Post-op Lt thigh hematoma. Could only walk ~60 steps PTA due to pain. PMHx DM, HTN   Clinical Impression   Pt admitted for the above diagnosis and has the deficits listed below.  Pt would benefit from cont OT to increase I with basic adls and adl transfers so she can d/c home with her daughter who works during the day.  Pt will need rehab before going to daughters due to severe immobility/pain of LLE.    OT Assessment  Patient needs continued OT Services    Follow Up Recommendations  SNF;Supervision/Assistance - 24 hour    Barriers to Discharge Decreased caregiver support Pt lives alone.  Can go to daugther's house but she works during the day.  Equipment Recommendations  3 in 1 bedside comode    Recommendations for Other Services    Frequency  Min 2X/week    Precautions / Restrictions Precautions Precautions: Fall Restrictions Weight Bearing Restrictions: No   Pertinent Vitals/Pain Pt c/o 6/10 pain in L leg.  BP 98/56 in sitting.  HR 71    ADL  Eating/Feeding: Performed;Independent Where Assessed - Eating/Feeding: Chair Grooming: Performed;Set up Where Assessed - Grooming: Unsupported sitting Upper Body Bathing: Simulated;Set up Where Assessed - Upper Body Bathing: Supported sitting Lower Body Bathing: Simulated;Maximal assistance Where Assessed - Lower Body Bathing: Supported sit to stand Upper Body Dressing: Simulated;Set up Where Assessed - Upper Body Dressing: Supported sitting Lower Body Dressing: Performed;Maximal assistance Where Assessed - Lower Body Dressing: Supported sit to stand Toilet Transfer: Performed;Maximal Dentist Method: Scientist, product/process development: Materials engineer and Hygiene: Performed;+1 Total assistance Where Assessed - Engineer, mining and Hygiene: Standing Equipment Used: Rolling walker Transfers/Ambulation Related to ADLs: Pt has a difficult time weight shifting from R to L w walker to pivot onto commode. Pt slides her feet on the floor.  Pt did not ambulate due to pain. ADL Comments: Pt needs a lot of assist with LE adls and transfers due to pain. With encouragement, pt does seem to attempt to do more.    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  Visit Information  Last OT Received On: 10/24/13 Assistance Needed: +2 History of Present Illness: Adm for Lt fem-pop bypass after failed PTCA and stents. Post-op Lt thigh hematoma. Could only walk ~60 steps PTA due to pain. 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 Dominant Hand: Right         Vision/Perception Vision - History Baseline Vision: No visual deficits Patient Visual Report: No change from baseline Vision - Assessment Vision Assessment: Vision not tested   [employer] Arousal/Alertness: Awake/alert Behavior During Therapy: WFL for tasks assessed/performed Overall Cognitive Status: Within Functional Limits for tasks assessed    Extremity/Trunk Assessment Upper Extremity Assessment Upper Extremity Assessment: Overall WFL for tasks assessed Lower Extremity Assessment Lower Extremity Assessment: Defer to PT evaluation Cervical / Trunk Assessment Cervical / Trunk Assessment: Normal     Mobility Bed Mobility Bed Mobility: Not assessed Supine to Sit: 1: +2 Total assist;HOB elevated;With rails Supine to Sit: Patient Percentage: 40% Sitting - Scoot to Edge of Bed: 4: Min assist Details for Bed Mobility Assistance: pt supine with leg on pillow, fully externally rotated and flexed at knee for comfort; after ROM exercises, pt still minimally able to move LLE on her own, flinching when bending RLE and poor use of UEs to come up to sitting; once sitting, incr time and cues to scoot to EOB Transfers Transfers: Sit to Stand;Stand to Sit Sit to Stand: 2: Max assist;With upper extremity assist;From chair/3-in-1 Stand to Sit: 3: Mod assist;With upper extremity assist Details for Transfer Assistance: RW stabilized for her to place one UE on RW to push up, with other hand on the bed; pt with good use of RLE to come to stand     Exercise General Exercises - Lower Extremity Ankle Circles/Pumps: AROM;Left;20  reps;Supine;Seated Quad Sets: AROM;Left;10 reps;Supine Heel Slides: AAROM;Left;5 reps;Supine Other Exercises Other Exercises: AAROM internal rotation Lt hip while in supine and in recliner; able to achieve neutral with prolonged stretching and contract relax methods Other Exercises: pt educated to do ankle pumps and quad sets (with knee and toes pointing up towards ceiling) every hour    Balance Balance Balance Assessed: Yes Static Standing Balance Static Standing - Balance Support: Bilateral upper extremity supported;During functional activity Static Standing - Level of Assistance: 4: Min assist Static Standing - Comment/# of Minutes: 2   End of Session OT - End of Session Equipment Utilized During Treatment: Rolling walker Activity Tolerance: Patient limited by pain Patient left: in chair;with call bell/phone within reach Nurse Communication: Mobility status;Other (comment) (need for +2 to get onto commode.)  GO     Hope Budds 10/24/2013, 11:49 AM [phone]

## 2013-10-24 NOTE — Telephone Encounter (Addendum)
Message copied by Fredrich Birks on Fri Oct 24, 2013  2:50 PM ------      Message from: Dara Lords      Created: Fri Oct 24, 2013  1:38 PM       S/p Left femoral to above-knee popliteal bypass with non-reversed greater saphenous vein, popliteal endarterectomy, vein patch angioplasty of popliteal artery, intraoperative arteriogram         On 10/21/13.            F/u with Dr. Darrick Penna in 2 weeks.            Thanks,      Samantha ------  10/24/13: spoke with patient to schedule,dpm

## 2013-10-25 LAB — GLUCOSE, CAPILLARY
Glucose-Capillary: 146 mg/dL — ABNORMAL HIGH (ref 70–99)
Glucose-Capillary: 184 mg/dL — ABNORMAL HIGH (ref 70–99)
Glucose-Capillary: 164 mg/dL — ABNORMAL HIGH (ref 70–99)
Glucose-Capillary: 184 mg/dL — ABNORMAL HIGH (ref 70–99)

## 2013-10-25 NOTE — Progress Notes (Signed)
[facility] Progress Note  10/25/2013 10:53 AM 4 Days Post-Op  Subjective:  Still c/o of pain  Tm 99 VSS  Filed Vitals:   10/25/13 0421  BP: 97/58  Pulse: 65  Temp: 99 F (37.2 C)  Resp: 18    Physical Exam: Incisions:  Incisions are in tact with staples.  Still with swelling in left thigh.  Blistering around incisions are unchanged. Extremities:  Palpable left DP pulse.  CBC    Component Value Date/Time   WBC 10.0 10/24/2013 0346   RBC 2.80* 10/24/2013 0346   HGB 8.6* 10/24/2013 0346   HCT 24.9* 10/24/2013 0346   PLT 154 10/24/2013 0346   MCV 88.9 10/24/2013 0346   MCH 30.7 10/24/2013 0346   MCHC 34.5 10/24/2013 0346   RDW 14.1 10/24/2013 0346   LYMPHSABS 2.8 09/26/2007 0843   MONOABS 0.4 09/26/2007 0843   EOSABS 0.2 09/26/2007 0843   BASOSABS 0.0 09/26/2007 0843    BMET    Component Value Date/Time   NA 141 10/22/2013 0040   K 4.2 10/22/2013 0040   CL 109 10/22/2013 0040   CO2 25 10/22/2013 0040   GLUCOSE 149* 10/22/2013 0040   BUN 11 10/22/2013 0040   CREATININE 0.67 10/22/2013 0040   CALCIUM 7.7* 10/22/2013 0040   GFRNONAA 88* 10/22/2013 0040   GFRAA >90 10/22/2013 0040    INR    Component Value Date/Time   INR 0.88 09/29/2013 1358     Intake/Output Summary (Last 24 hours) at 10/25/13 1053 Last data filed at 10/25/13 0700  Gross per 24 hour  Intake    720 ml  Output    200 ml  Net    520 ml     Assessment:  67 y.o. female is s/p:  Left femoral to above-knee popliteal bypass with non-reversed greater saphenous vein, popliteal endarterectomy, vein patch angioplasty of popliteal artery, intraoperative arteriogram   4 Days Post-Op  Plan: -pt is still having pain issues and not able to mobilize much -continue to try to mobilize more today -PT recommends rehab-will put in for a CIR consult -bypass graft is patent -DVT prophylaxis:  Will not start Lovenox due to bleeding into thigh post operatively-use SCD's   Doreatha Massed, PA-C [facility] [phone] 10/25/2013 10:53 AM

## 2013-10-25 NOTE — Progress Notes (Signed)
Pt walked to nursing station and back about 60 ft.

## 2013-10-26 LAB — GLUCOSE, CAPILLARY
Glucose-Capillary: 157 mg/dL — ABNORMAL HIGH (ref 70–99)
Glucose-Capillary: 132 mg/dL — ABNORMAL HIGH (ref 70–99)
Glucose-Capillary: 118 mg/dL — ABNORMAL HIGH (ref 70–99)
Glucose-Capillary: 145 mg/dL — ABNORMAL HIGH (ref 70–99)

## 2013-10-26 NOTE — Progress Notes (Signed)
Patient ID: Jill Higgins, female   DOB: 12-Jul-1946, 67 y.o.   MRN: [id number] Vascular Surgery Progress Note  Subjective: 5 days post left femoropopliteal. Patient developed hematoma left thigh following repair of intimal flap left distal popliteal artery. Pain level is improving. She is beginning to ambulate in the hall.  Objective:  Filed Vitals:   10/26/13 0421  BP: 129/63  Pulse: 65  Temp: 97.7 F (36.5 C)  Resp: 18    General alert and oriented x3 Lungs no rhonchi or wheezing Leg continues to have 1+ edema throughout. Blistering posterior to thigh wounds intact with no evidence of infection. Left foot adequately perfused.   Labs:  Recent Labs Lab 10/21/13 1800 10/22/13 0040  CREATININE 0.73 0.67    Recent Labs Lab 10/21/13 1800 10/22/13 0040  NA  --  141  K  --  4.2  CL  --  109  CO2  --  25  BUN  --  11  CREATININE 0.73 0.67  GLUCOSE  --  149*  CALCIUM  --  7.7*    Recent Labs Lab 10/22/13 0040 10/23/13 0400 10/24/13 0346  WBC 9.5 9.6 10.0  HGB 7.3* 8.7* 8.6*  HCT 21.2* 24.8* 24.9*  PLT 210 152 154   No results found for this basename: INR,  in the last 168 hours  I/O last 3 completed shifts: In: 730 [P.O.:730] Out: 250 [Urine:250]  Imaging: No results found.  Assessment/Plan:  POD #5  LOS: 5 days  s/p Procedure(s): LEFT FEMORAL-POPLITEAL ARTERY BYPASS WITH SAPHENOUS VEIN GRAFT , POPLITEAL ENDARTERECTOMY ,INTRAOPERATIVE ARTERIOGRAM, vein patch angioplasty to popliteal artery  Patient doing much better 5 days post left femoral-popliteal bypass graft. Continues to have thigh pain which is improving each day. Her ambulation is also improving. She would like to be discharged tomorrow.   Plan continue ambulation today and we'll plan DC home in a.m. after seen by Dr. Darrick Penna   Josephina Gip, MD 10/26/2013 8:42 AM

## 2013-10-27 DIAGNOSIS — R5381 Other malaise: Secondary | ICD-10-CM

## 2013-10-27 LAB — GLUCOSE, CAPILLARY
Glucose-Capillary: 128 mg/dL — ABNORMAL HIGH (ref 70–99)
Glucose-Capillary: 140 mg/dL — ABNORMAL HIGH (ref 70–99)
Glucose-Capillary: 133 mg/dL — ABNORMAL HIGH (ref 70–99)
Glucose-Capillary: 159 mg/dL — ABNORMAL HIGH (ref 70–99)

## 2013-10-27 NOTE — Progress Notes (Signed)
The Chaplain offered emotional and spiritual support to the patient today. The patient expressed her gratitude that a Chaplain stopped by to spend sometime with her. The patient expressed her excitement that she will be going home and asked the Chaplain to continue to pray for her as she goes home to begin the healing process concerning her leg.  Chaplain Bryson Ha Betti Goodenow

## 2013-10-27 NOTE — Progress Notes (Signed)
Pain noted to be limiting ambulation today. If she does not progress with therapy we would recommend SNF rehab. Pt not appropriate for inpt rehab at this time. [phone]

## 2013-10-27 NOTE — Consult Note (Signed)
Physical Medicine and Rehabilitation Consult Reason for Consult: Deconditioning/left femoral-popliteal artery bypass Referring Physician: Dr. Darrick Penna   HPI: Jill Higgins is a 67 y.o. right handed female with history of diabetes mellitus with peripheral neuropathy, hypertension as well as peripheral vascular disease. Admitted 10/21/2013 with progressive claudication of both legs. She has had SFA PTA and stents in the past which have occluded. She states her left leg is worse than her right. She can only walk about 60 steps before pain increases. Underwent left femoral to above knee popliteal bypass, popliteal endarterectomy, vein patch angioplasty of popliteal artery 10/21/2013 per Dr. Darrick Penna. Hospital course acute blood loss anemia and transfused. Patient left thigh hematoma after surgery and monitored closely. Question plan to begin subcutaneous Lovenox as hematoma resolves. Pain control as directed. Class therapy noted 10/24/2013 and await followup therapy and ongoing progress. M.D.has requested physical medicine rehabilitation consult to consider inpatient rehabilitation services.  Standing at sink talking to dietary  Review of Systems  Gastrointestinal:       GERD  Genitourinary:       Overactive bladder  Neurological: Positive for tingling and weakness.  Psychiatric/Behavioral: Positive for depression.       Anxiety  All other systems reviewed and are negative.   Past Medical History  Diagnosis Date  . Tobacco abuse   . Hypertension   . Hyperlipidemia   . Peripheral vascular disease   . Fibromyalgia   . GERD (gastroesophageal reflux disease)   . Depression with anxiety   . Diabetes mellitus     Type II  . Bronchitis   . Bulging disc     in neck  . Pneumonia   . Overactive bladder    Past Surgical History  Procedure Laterality Date  . Aortogram w/ pta  05/14/08,  11-04-10    Bilateral aortogram w/ bilateral  SFA PTA  stenting   . Hand surgery Left 2010  . Tonsillectomy     . Dilation and curettage of uterus      X4  . Bunionectomy      L foot in the 1980s  . Colonoscopy      2014  . Epidural shots in neck     . Femoral-popliteal bypass graft Left 10/21/2013    Procedure: LEFT FEMORAL-POPLITEAL ARTERY BYPASS WITH SAPHENOUS VEIN GRAFT , POPLITEAL ENDARTERECTOMY ,INTRAOPERATIVE ARTERIOGRAM, vein patch angioplasty to popliteal artery;  Surgeon: Sherren Kerns, MD;  Location: [facility] OR;  Service: Vascular;  Laterality: Left;   Family History  Problem Relation Age of Onset  . Heart disease Father   . Heart attack Father     MI at age 59  . Hypertension Mother   . Alzheimer's disease Mother   . Hypertension Sister   . Diabetes Brother   . Hyperlipidemia Brother   . Hypertension Brother    Social History:  reports that she has been smoking Cigarettes.  She has been smoking about 0.00 packs per day. She has never used smokeless tobacco. She reports that she drinks alcohol. She reports that she does not use illicit drugs. Allergies: No Known Allergies Medications Prior to Admission  Medication Sig Dispense Refill  . amLODipine-olmesartan (AZOR) 5-40 MG per tablet Take 1 tablet by mouth daily.      Marland Kitchen aspirin EC 81 MG tablet Take 81 mg by mouth daily.      Marland Kitchen atenolol (TENORMIN) 50 MG tablet Take 50 mg by mouth daily.      . benzonatate (TESSALON) 200 MG capsule Take  200 mg by mouth daily.      Marland Kitchen Dextromethorphan-Guaifenesin 5-100 MG/5ML LIQD Take 5 mLs by mouth every 4 (four) hours as needed (congestion).      Marland Kitchen PROAIR HFA 108 (90 BASE) MCG/ACT inhaler Inhale 2 puffs into the lungs every 4 (four) hours as needed for wheezing or shortness of breath.       . rosuvastatin (CRESTOR) 5 MG tablet Take 2.5 mg by mouth daily.      . sitaGLIPtan-metformin (JANUMET) 50-1000 MG per tablet Take 1 tablet by mouth 2 (two) times daily with a meal.      . temazepam (RESTORIL) 30 MG capsule Take 30 mg by mouth at bedtime as needed for sleep.       . TRAVATAN Z 0.004 % SOLN  ophthalmic solution Place 1 drop into both eyes at bedtime.       Marland Kitchen guaiFENesin (MUCINEX) 600 MG 12 hr tablet Take by mouth 2 (two) times daily.        Home: Home Living Family/patient expects to be discharged to:: Private residence Living Arrangements: Alone Available Help at Discharge: Available PRN/intermittently Type of Home: House Home Access: Stairs to enter Secretary/administrator of Steps: 1 Entrance Stairs-Rails: None Home Layout: Two level Home Equipment: None  Functional History: Prior Function Comments: works at Hydrographic surveyor for [employer] Functional Status:  Mobility: Bed Mobility Bed Mobility: Not assessed Supine to Sit: 1: +2 Total assist;HOB elevated;With rails Supine to Sit: Patient Percentage: 40% Sitting - Scoot to Edge of Bed: 4: Min assist Transfers Transfers: Sit to Stand;Stand to Sit Sit to Stand: 2: Max assist;With upper extremity assist;From chair/3-in-1 Stand to Sit: 3: Mod assist;With upper extremity assist Stand Pivot Transfers: 2: Max assist Ambulation/Gait Ambulation/Gait Assistance: 1: +2 Total assist Ambulation/Gait: Patient Percentage: 60% Ambulation Distance (Feet): 5 Feet Assistive device: Rolling walker Ambulation/Gait Assistance Details: pt required constant cues for technique and initially assist to advance her RIGHT leg (due to self-limiting her weight bearing on LLE); pt ultimately was advancing each leg herself; followed with chair Gait Pattern: Step-to pattern;Decreased stride length;Decreased weight shift to left;Left foot flat;Right foot flat;Antalgic Gait velocity: extremely slow    ADL: ADL Eating/Feeding: Performed;Independent Where Assessed - Eating/Feeding: Chair Grooming: Performed;Set up Where Assessed - Grooming: Unsupported sitting Upper Body Bathing: Simulated;Set up Where Assessed - Upper Body Bathing: Supported sitting Lower Body Bathing: Simulated;Maximal assistance Where Assessed - Lower Body Bathing: Supported  sit to stand Upper Body Dressing: Simulated;Set up Where Assessed - Upper Body Dressing: Supported sitting Lower Body Dressing: Performed;Maximal assistance Where Assessed - Lower Body Dressing: Supported sit to stand Toilet Transfer: Performed;Maximal assistance Toilet Transfer Method: Surveyor, minerals: Bedside commode Equipment Used: Rolling walker Transfers/Ambulation Related to ADLs: Pt has a difficult time weight shifting from R to L w walker to pivot onto commode. Pt slides her feet on the floor.  Pt did not ambulate due to pain. ADL Comments: Pt needs a lot of assist with LE adls and transfers due to pain. With encouragement, pt does seem to attempt to do more.  Cognition: Cognition Overall Cognitive Status: Within Functional Limits for tasks assessed Orientation Level: Oriented X4 Cognition Arousal/Alertness: Awake/alert Behavior During Therapy: WFL for tasks assessed/performed Overall Cognitive Status: Within Functional Limits for tasks assessed  Blood pressure 102/40, pulse 57, temperature 98.3 F (36.8 C), temperature source Oral, resp. rate 18, height 5' 1.81" (1.57 m), weight 66.7 kg (147 lb 0.8 oz), SpO2 97.00%. Physical Exam  Vitals reviewed. Constitutional: She  is oriented to person, place, and time.  HENT:  Head: Normocephalic.  Eyes: EOM are normal.  Neck: Normal range of motion. Neck supple. No thyromegaly present.  Cardiovascular: Normal rate and regular rhythm.   Respiratory: Effort normal and breath sounds normal. No respiratory distress.  GI: Bowel sounds are normal. She exhibits no distension.  Neurological: She is alert and oriented to person, place, and time.  Skin:  Left lower extremity incision site with staples intact with some serosanguineous drainage and blistering along the surgical line  Psychiatric: She has a normal mood and affect.  5/5 bilateral delt bi, tri, grip, HF, KE, ADF Ambulated mod I 10 feet from doorway to chair,  antalgic gait favoring LLE Results for orders placed during the hospital encounter of 10/21/13 (from the past 24 hour(s))  GLUCOSE, CAPILLARY     Status: Abnormal   Collection Time    10/26/13 11:39 AM      Result Value Range   Glucose-Capillary 132 (*) 70 - 99 mg/dL  GLUCOSE, CAPILLARY     Status: Abnormal   Collection Time    10/26/13  4:12 PM      Result Value Range   Glucose-Capillary 118 (*) 70 - 99 mg/dL  GLUCOSE, CAPILLARY     Status: Abnormal   Collection Time    10/26/13  9:16 PM      Result Value Range   Glucose-Capillary 145 (*) 70 - 99 mg/dL   Comment 1 Documented in Chart     Comment 2 Notify RN    GLUCOSE, CAPILLARY     Status: Abnormal   Collection Time    10/27/13  6:20 AM      Result Value Range   Glucose-Capillary 128 (*) 70 - 99 mg/dL   No results found.  Assessment/Plan: Diagnosis: deconditioning after fem/pop bypass 1. Does the need for close, 24 hr/day medical supervision in concert with the patient's rehab needs make it unreasonable for this patient to be served in a less intensive setting? No 2. Co-Morbidities requiring supervision/potential complications: NA 3. Due to NA, does the patient require 24 hr/day rehab nursing? No 4. Does the patient require coordinated care of a physician, rehab nurse, NA to address physical and functional deficits in the context of the above medical diagnosis(es)? No Addressing deficits in the following areas: NA 5. Can the patient actively participate in an intensive therapy program of at least 3 hrs of therapy per day at least 5 days per week? Potentially 6. The potential for patient to make measurable gains while on inpatient rehab is NA 7. Anticipated functional outcomes upon discharge from inpatient rehab are NA with PT, na with OT, NA with SLP. 8. Estimated rehab length of stay to reach the above functional goals is: NA 9. Does the patient have adequate social supports to accommodate these discharge functional goals?  Potentially 10. Anticipated D/C setting: Home 11. Anticipated post D/C treatments: HH therapy 12. Overall Rehab/Functional Prognosis: excellent  RECOMMENDATIONS: This patient's condition is appropriate for continued rehabilitative care in the following setting: [facility] Patient has agreed to participate in recommended program. Yes Note that insurance prior authorization may be required for reimbursement for recommended care.  Comment: Too high level for CIR, excellent progress over weekend!    10/27/2013

## 2013-10-27 NOTE — Progress Notes (Signed)
[facility] [place]  Subjective  - Leg overall less sore, walking some   Objective 101/54 58 98.4 F (36.9 C) (Oral) 18 99%  Intake/Output Summary (Last 24 hours) at 10/27/13 1507 Last data filed at 10/27/13 1300  Gross per 24 hour  Intake    730 ml  Output    700 ml  Net     30 ml   Left lower extremity foot warm, incisions healing, still with thigh edema, some blisters from prior tape.  Assessment/Planning: Slowly improving post left fem pop Possible d/c am, ambulate today  Jill Higgins E 10/27/2013 3:07 PM --  Laboratory Lab Results: No results found for this basename: WBC, HGB, HCT, PLT,  in the last 72 hours BMET No results found for this basename: NA, K, CL, CO2, GLUCOSE, BUN, CREATININE, CALCIUM,  in the last 72 hours  COAG Lab Results  Component Value Date   INR 0.88 09/29/2013   INR 0.9 05/14/2008   No results found for this basename: PTT

## 2013-10-27 NOTE — Progress Notes (Signed)
Occupational Therapy Treatment Patient Details Name: Jill Higgins MRN: [id number] DOB: 05/06/1946 Today's Date: 10/27/2013 Time: 4098-1191 OT Time Calculation (min): 26 min  OT Assessment / Plan / Recommendation  History of present illness Adm for Lt fem-pop bypass after failed PTCA and stents. Post-op Lt thigh hematoma. Could only walk ~60 steps PTA due to pain. PMHx DM, HTN   OT comments  Pt. Able to amb. To/from b.room slowly and also manage peri care and clothing management in standing with cga.    Follow Up Recommendations  SNF;Supervision/Assistance - 24 hour           Equipment Recommendations  3 in 1 bedside comode        Frequency Min 2X/week   Progress towards OT Goals Progress towards OT goals: Progressing toward goals  Plan Discharge plan remains appropriate    Precautions / Restrictions Precautions Precautions: Fall Restrictions Weight Bearing Restrictions: No   Pertinent Vitals/Pain 5/10, assisted with re-positioning    ADL  Lower Body Dressing: Performed;Set up Where Assessed - Lower Body Dressing: Unsupported sitting Toilet Transfer: Performed;Min guard Toilet Transfer Method: Sit to stand;Stand pivot Acupuncturist: Regular height toilet;Grab bars Toileting - Clothing Manipulation and Hygiene: Performed;Supervision/safety Where Assessed - Glass blower/designer Manipulation and Hygiene: Standing Transfers/Ambulation Related to ADLs: slow, but able to use b ues to push through walker ADL Comments: verbalized understanding that she needs slippers with backs as she had noted difficulty amb. with slip on style slippers.        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 Post-op Lt thigh hematoma. Could only walk ~60 steps PTA due to pain. PMHx DM, HTN          Prior Functioning       Cognition  Cognition Arousal/Alertness: Awake/alert Behavior During Therapy: WFL for tasks assessed/performed Overall Cognitive Status: Within Functional Limits for tasks assessed    Mobility  Bed Mobility Bed Mobility: Supine to Sit;Sitting - Scoot to [place] of Bed;Sit to Supine Supine to Sit: 4: Min assist Sitting - Scoot to [place] of Bed: 5: Supervision Sit to Supine: 4: Min assist Details for Bed Mobility Assistance: hob flat, no rails, pt. able to push through b ues to bring trunk upwards into sitting position and scoot hips to eob Transfers Transfers: Sit to Stand;Stand to Sit Sit to Stand: From bed;From toilet;4: Min guard Stand to Sit: 4: Min guard;To bed;To toilet Details for Transfer Assistance: pt needed feet blocked to prevent from sliding.  cues for UE use and anterior wt shift with sit to stand.            Balance Balance Balance Assessed: No   End of Session OT - End of Session Equipment Utilized During Treatment: Rolling walker Activity Tolerance: Patient tolerated treatment well Patient left: in bed;with call bell/phone within reach       Robet Leu, COTA/L 10/27/2013, 3:57 PM

## 2013-10-27 NOTE — Progress Notes (Signed)
Physical Therapy Treatment Patient Details Name: Jill Higgins MRN: [id number] DOB: June 06, 1946 Today's Date: 10/27/2013 Time: 6962-9528 PT Time Calculation (min): 25 min  PT Assessment / Plan / Recommendation  History of Present Illness Adm for Lt fem-pop bypass after failed PTCA and stents. Post-op Lt thigh hematoma. Could only walk ~60 steps PTA due to pain. PMHx DM, HTN   PT Comments   Pt very painful today and notes it has been more than 4 hours since her last pain meds.  Mobility limited by pain today.  RN made aware of need for meds.  Will continue to follow.    Follow Up Recommendations  SNF     Does the patient have the potential to tolerate intense rehabilitation     Barriers to Discharge        Equipment Recommendations  Rolling walker with 5" wheels    Recommendations for Other Services    Frequency Min 3X/week   Progress towards PT Goals Progress towards PT goals: Progressing toward goals  Plan Current plan remains appropriate    Precautions / Restrictions Precautions Precautions: Fall Restrictions Weight Bearing Restrictions: No   Pertinent Vitals/Pain R LE "Terrible".      Mobility  Bed Mobility Bed Mobility: Supine to Sit;Sitting - Scoot to Edge of Bed;Sit to Supine Supine to Sit: 4: Min assist Sitting - Scoot to Edge of Bed: 5: Supervision Sit to Supine: 4: Min assist Details for Bed Mobility Assistance: cues for sequencing and A with L LE.   Transfers Transfers: Sit to Stand;Stand to Sit Sit to Stand: 3: Mod assist;With upper extremity assist;From bed Stand to Sit: 3: Mod assist;With upper extremity assist;To bed Details for Transfer Assistance: pt needed feet blocked to prevent from sliding.  cues for UE use and anterior wt shift with sit to stand.   Ambulation/Gait Ambulation/Gait Assistance: 4: Min assist Ambulation Distance (Feet): 40 Feet Assistive device: Rolling walker Ambulation/Gait Assistance Details: pt indicates increased pain today  limiting ambulation distance.  RN made aware of need for pain meds.  pt moves very slowly and slides LEs forward rather than actually take a step.   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 Post-op Lt thigh hematoma. Could only walk ~60 steps PTA due to pain. PMHx DM, HTN    Subjective Data  Patient Stated Goal: wants to be able to walk and climb stairs to return to work as a Scientist, water quality Arousal/Alertness: Awake/alert Behavior During Therapy: WFL for tasks assessed/performed Overall Cognitive Status: Within Functional Limits for tasks assessed    Balance  Balance Balance Assessed: No  End of Session PT - End of Session Equipment Utilized During Treatment: Gait belt;Oxygen Activity Tolerance: Patient limited by pain Patient left: in bed;with call bell/phone within reach Nurse Communication: Mobility status   GP  [name], [place] [phone] 10/27/2013, 2:39 PM

## 2013-10-28 ENCOUNTER — Encounter (HOSPITAL_COMMUNITY): Payer: BC Managed Care – PPO | Admitting: Certified Registered Nurse Anesthetist

## 2013-10-28 ENCOUNTER — Encounter (HOSPITAL_COMMUNITY)
Admission: RE | Disposition: A | Discharge status: Home Health | Payer: Self-pay | Source: Ambulatory Visit | Attending: Vascular Surgery

## 2013-10-28 ENCOUNTER — Encounter (HOSPITAL_COMMUNITY): Payer: Self-pay | Admitting: Certified Registered Nurse Anesthetist

## 2013-10-28 ENCOUNTER — Inpatient Hospital Stay (HOSPITAL_COMMUNITY): Payer: BC Managed Care – PPO | Admitting: Certified Registered Nurse Anesthetist

## 2013-10-28 DIAGNOSIS — M7981 Nontraumatic hematoma of soft tissue: Secondary | ICD-10-CM

## 2013-10-28 HISTORY — PX: GROIN DEBRIDEMENT: SHX5159

## 2013-10-28 LAB — BASIC METABOLIC PANEL
BUN: 19 mg/dL (ref 6–23)
CO2: 25 mEq/L (ref 19–32)
Calcium: 8.3 mg/dL — ABNORMAL LOW (ref 8.4–10.5)
Chloride: 104 mEq/L (ref 96–112)
Creatinine, Ser: 0.68 mg/dL (ref 0.50–1.10)
GFR calc Af Amer: >90 mL/min (ref >90)
GFR calc non Af Amer: 88 mL/min — ABNORMAL LOW (ref >90)
Glucose, Bld: 170 mg/dL — ABNORMAL HIGH (ref 70–99)
Potassium: 3.5 mEq/L (ref 3.5–5.1)
Sodium: 138 mEq/L (ref 135–145)

## 2013-10-28 LAB — GLUCOSE, CAPILLARY
Glucose-Capillary: 125 mg/dL — ABNORMAL HIGH (ref 70–99)
Glucose-Capillary: 104 mg/dL — ABNORMAL HIGH (ref 70–99)
Glucose-Capillary: 103 mg/dL — ABNORMAL HIGH (ref 70–99)
Glucose-Capillary: 96 mg/dL (ref 70–99)
Glucose-Capillary: 153 mg/dL — ABNORMAL HIGH (ref 70–99)

## 2013-10-28 LAB — CBC
HCT: 25.2 % — ABNORMAL LOW (ref 36.0–46.0)
Hemoglobin: 8.3 g/dL — ABNORMAL LOW (ref 12.0–15.0)
MCH: 30 pg (ref 26.0–34.0)
MCHC: 32.9 g/dL (ref 30.0–36.0)
MCV: 91 fL (ref 78.0–100.0)
Platelets: 296 10*3/uL (ref 150–400)
RBC: 2.77 MIL/uL — ABNORMAL LOW (ref 3.87–5.11)
RDW: 13.7 % (ref 11.5–15.5)
WBC: 7.8 10*3/uL (ref 4.0–10.5)

## 2013-10-28 LAB — CREATININE, SERUM
Creatinine, Ser: 0.7 mg/dL (ref 0.50–1.10)
GFR calc Af Amer: >90 mL/min (ref >90)
GFR calc non Af Amer: 87 mL/min — ABNORMAL LOW (ref >90)

## 2013-10-28 SURGERY — DEBRIDEMENT, INGUINAL REGION
Anesthesia: General | Site: Thigh | Laterality: Left

## 2013-10-28 MED ORDER — 0.9 % SODIUM CHLORIDE (POUR BTL) OPTIME
TOPICAL | Status: DC | PRN
  Administered 2013-10-28: 2000 mL

## 2013-10-28 MED ORDER — OXYCODONE HCL 5 MG/5ML PO SOLN
5.0000 mg | Freq: Once | ORAL | Status: AC | PRN
Start: 2013-10-28 — End: 2013-10-28

## 2013-10-28 MED ORDER — MIDAZOLAM HCL 5 MG/5ML IJ SOLN
INTRAMUSCULAR | Status: DC | PRN
  Administered 2013-10-28: 1 mg via INTRAVENOUS

## 2013-10-28 MED ORDER — SODIUM CHLORIDE 0.9 % IR SOLN
Status: DC | PRN
  Administered 2013-10-28: 15:00:00

## 2013-10-28 MED ORDER — OXYCODONE HCL 5 MG PO TABS
ORAL_TABLET | ORAL | Status: AC
  Filled 2013-10-28: qty 1

## 2013-10-28 MED ORDER — LACTATED RINGERS IV SOLN: INTRAVENOUS | Status: DC

## 2013-10-28 MED ORDER — ROCURONIUM BROMIDE 100 MG/10ML IV SOLN
INTRAVENOUS | Status: DC | PRN
  Administered 2013-10-28: 40 mg via INTRAVENOUS

## 2013-10-28 MED ORDER — PROPOFOL 10 MG/ML IV BOLUS
INTRAVENOUS | Status: DC | PRN
  Administered 2013-10-28: 150 mg via INTRAVENOUS

## 2013-10-28 MED ORDER — HYDROMORPHONE HCL PF 1 MG/ML IJ SOLN
0.2500 mg | INTRAMUSCULAR | Status: DC | PRN
  Administered 2013-10-28: 0.25 mg via INTRAVENOUS
  Administered 2013-10-28 (×3): 0.5 mg via INTRAVENOUS
  Administered 2013-10-28: 0.25 mg via INTRAVENOUS

## 2013-10-28 MED ORDER — OXYCODONE HCL 5 MG PO TABS
5.0000 mg | ORAL_TABLET | Freq: Once | ORAL | Status: AC | PRN
  Administered 2013-10-28: 5 mg via ORAL

## 2013-10-28 MED ORDER — NEOSTIGMINE METHYLSULFATE 1 MG/ML IJ SOLN
INTRAMUSCULAR | Status: DC | PRN
  Administered 2013-10-28: 3 mg via INTRAVENOUS

## 2013-10-28 MED ORDER — DEXTROSE 5 % IV SOLN
3.0000 g | INTRAVENOUS | Status: DC | PRN
  Administered 2013-10-28: 2 g via INTRAVENOUS

## 2013-10-28 MED ORDER — CEFAZOLIN SODIUM-DEXTROSE 2-3 GM-% IV SOLR
INTRAVENOUS | Status: AC
  Filled 2013-10-28: qty 50

## 2013-10-28 MED ORDER — PHENYLEPHRINE HCL 10 MG/ML IJ SOLN
10.0000 mg | INTRAVENOUS | Status: DC | PRN
  Administered 2013-10-28: 25 ug/min via INTRAVENOUS

## 2013-10-28 MED ORDER — LIDOCAINE HCL (CARDIAC) 20 MG/ML IV SOLN
INTRAVENOUS | Status: DC | PRN
  Administered 2013-10-28: 60 mg via INTRAVENOUS

## 2013-10-28 MED ORDER — THROMBIN 20000 UNITS EX SOLR
CUTANEOUS | Status: AC
  Filled 2013-10-28: qty 20000

## 2013-10-28 MED ORDER — DEXTROSE 5 % IV SOLN
750.0000 mg | Freq: Three times a day (TID) | INTRAVENOUS | Status: AC
  Administered 2013-10-28 – 2013-10-29 (×3): 750 mg via INTRAVENOUS
  Filled 2013-10-28 (×3): qty 750

## 2013-10-28 MED ORDER — FENTANYL CITRATE 0.05 MG/ML IJ SOLN
INTRAMUSCULAR | Status: DC | PRN
  Administered 2013-10-28: 50 ug via INTRAVENOUS

## 2013-10-28 MED ORDER — HYDROMORPHONE HCL PF 1 MG/ML IJ SOLN
INTRAMUSCULAR | Status: AC
  Filled 2013-10-28: qty 1

## 2013-10-28 MED ORDER — PHENYLEPHRINE HCL 10 MG/ML IJ SOLN
INTRAMUSCULAR | Status: DC | PRN
  Administered 2013-10-28: 80 ug via INTRAVENOUS
  Administered 2013-10-28 (×2): 40 ug via INTRAVENOUS
  Administered 2013-10-28: 80 ug via INTRAVENOUS

## 2013-10-28 MED ORDER — GLYCOPYRROLATE 0.2 MG/ML IJ SOLN
INTRAMUSCULAR | Status: DC | PRN
  Administered 2013-10-28: .6 mg via INTRAVENOUS

## 2013-10-28 MED ORDER — ALBUMIN HUMAN 5 % IV SOLN
INTRAVENOUS | Status: DC | PRN
  Administered 2013-10-28: 15:00:00 via INTRAVENOUS

## 2013-10-28 MED ORDER — METOCLOPRAMIDE HCL 5 MG/ML IJ SOLN: 10.0000 mg | Freq: Once | INTRAMUSCULAR | Status: DC | PRN

## 2013-10-28 MED ORDER — LACTATED RINGERS IV SOLN
INTRAVENOUS | Status: DC | PRN
  Administered 2013-10-28 (×2): via INTRAVENOUS

## 2013-10-28 MED ORDER — ONDANSETRON HCL 4 MG/2ML IJ SOLN
INTRAMUSCULAR | Status: DC | PRN
  Administered 2013-10-28: 4 mg via INTRAVENOUS

## 2013-10-28 MED ORDER — LACTATED RINGERS IV SOLN
INTRAVENOUS | Status: DC
  Administered 2013-10-28: 14:00:00 via INTRAVENOUS

## 2013-10-28 SURGICAL SUPPLY — 43 items
BANDAGE GAUZE ELAST BULKY 4 IN (GAUZE/BANDAGES/DRESSINGS) ×1 IMPLANT
BNDG COHESIVE 4X5 TAN STRL (GAUZE/BANDAGES/DRESSINGS) ×1 IMPLANT
CANISTER SUCTION 2500CC (MISCELLANEOUS) ×2 IMPLANT
COVER SURGICAL LIGHT HANDLE (MISCELLANEOUS) ×2 IMPLANT
DRAIN JACKSON PRATT 10MM FLAT (MISCELLANEOUS) ×2 IMPLANT
DRAPE WARM FLUID 44X44 (DRAPE) ×1 IMPLANT
ELECT REM PT RETURN 9FT ADLT (ELECTROSURGICAL) ×2
ELECTRODE REM PT RTRN 9FT ADLT (ELECTROSURGICAL) ×1 IMPLANT
EVACUATOR SILICONE 100CC (DRAIN) ×2 IMPLANT
GAUZE XEROFORM 5X9 LF (GAUZE/BANDAGES/DRESSINGS) ×1 IMPLANT
GLOVE BIO SURGEON STRL SZ7.5 (GLOVE) ×2 IMPLANT
GLOVE BIOGEL PI IND STRL 6.5 (GLOVE) IMPLANT
GLOVE BIOGEL PI IND STRL 7.0 (GLOVE) IMPLANT
GLOVE BIOGEL PI IND STRL 7.5 (GLOVE) IMPLANT
GLOVE BIOGEL PI IND STRL 8 (GLOVE) IMPLANT
GLOVE BIOGEL PI INDICATOR 6.5 (GLOVE) ×2
GLOVE BIOGEL PI INDICATOR 7.0 (GLOVE) ×1
GLOVE BIOGEL PI INDICATOR 7.5 (GLOVE) ×1
GLOVE BIOGEL PI INDICATOR 8 (GLOVE) ×1
GLOVE ECLIPSE 6.5 STRL STRAW (GLOVE) ×2 IMPLANT
GLOVE SS BIOGEL STRL SZ 7 (GLOVE) IMPLANT
GLOVE SUPERSENSE BIOGEL SZ 7 (GLOVE) ×1
GLOVE SURG SS PI 7.0 STRL IVOR (GLOVE) ×1 IMPLANT
GOWN PREVENTION PLUS XLARGE (GOWN DISPOSABLE) ×2 IMPLANT
GOWN STRL NON-REIN LRG LVL3 (GOWN DISPOSABLE) ×6 IMPLANT
GOWN STRL REIN XL XLG (GOWN DISPOSABLE) ×1 IMPLANT
KIT BASIN OR (CUSTOM PROCEDURE TRAY) ×2 IMPLANT
KIT ROOM TURNOVER OR (KITS) ×2 IMPLANT
NS IRRIG 1000ML POUR BTL (IV SOLUTION) ×4 IMPLANT
PACK PERIPHERAL VASCULAR (CUSTOM PROCEDURE TRAY) ×1 IMPLANT
PAD ARMBOARD 7.5X6 YLW CONV (MISCELLANEOUS) ×4 IMPLANT
SPONGE GAUZE 4X4 12PLY (GAUZE/BANDAGES/DRESSINGS) ×2 IMPLANT
STAPLER VISISTAT 35W (STAPLE) ×1 IMPLANT
SUT ETHILON 3 0 PS 1 (SUTURE) ×2 IMPLANT
SUT VIC AB 2-0 CTX 36 (SUTURE) IMPLANT
SUT VIC AB 3-0 SH 27 (SUTURE) ×4
SUT VIC AB 3-0 SH 27X BRD (SUTURE) IMPLANT
SUT VICRYL 4-0 PS2 18IN ABS (SUTURE) IMPLANT
TOWEL OR 17X24 6PK STRL BLUE (TOWEL DISPOSABLE) ×2 IMPLANT
TOWEL OR 17X26 10 PK STRL BLUE (TOWEL DISPOSABLE) ×2 IMPLANT
TRAY FOLEY CATH 16FRSI W/METER (SET/KITS/TRAYS/PACK) ×1 IMPLANT
WATER STERILE IRR 1000ML POUR (IV SOLUTION) ×2 IMPLANT
YANKAUER SUCT BULB TIP NO VENT (SUCTIONS) ×1 IMPLANT

## 2013-10-28 NOTE — Discharge Summary (Signed)
[facility] Discharge Summary  Jill Higgins 10-28-1946 67 y.o. female  [id number]  Admission Date: 10/21/2013  Discharge Date: 11/06/2013  Physician: Sherren Kerns, MD  Admission Diagnosis: PVD infected l thigh   HPI:   This is a 67 y.o. female with hx DM, HTN and known PVD who has been having worsening claudication in both legs. Pt has had SFA PTA and stents which have occluded. She states the left leg is worse than the right. She can walk only about 60 steps before she has pain. She also C/O pain in the legs with steps but denies night or rest pain. 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 She does have peripheral neuropathy with tingling in her feet. She has tried conservative management for several years and feels that at this point she wishes to proceed with bypass. She has previously been informed of the risks benefits and possible complications of a bypass procedure.  Hospital Course:  The patient was admitted to the hospital and taken to the operating room on 10/21/2013 and underwent: Left femoral to above-knee popliteal bypass with non-reversed greater saphenous vein, popliteal endarterectomy, vein patch angioplasty of popliteal artery, intraoperative arteriogram  The pt tolerated the procedure well and was transported to the PACU in good condition.  By POD 1, bypass graft is patent with audible doppler signals in left foot. She does have a left thigh hematoma as well as acute blood loss anemia and required two units PRBC's. She remained in the stepdown unit that day. She did require dopamine for BP, but this was weaned successfully. She was not given Lovenox due to thigh hematoma, but was placed on SCD's bilaterally.  By POD 2, she had some tape blisters from the dressing. She had brisk flow to left foot. She was transferred to the telemetry unit on POD 2. She did have low grade  fevers the first couple of days, but this did resolve.  ABI's were done on 10/23/13 and are as follows:    RIGHT    LEFT     PRESSURE  WAVEFORM   PRESSURE  WAVEFORM   BRACHIAL   triphasic  BRACHIAL  107  triphasic   DP    DP     AT  63  Dampened monophasic  AT  65  monophasic   PT  54  Dampened monophasic  PT  56  monophasic   PER    PER     GREAT TOE   NA  GREAT TOE   NA     RIGHT  LEFT   ABI  0.59  0.61    On POD 7, the pt returned to the operating room and underwent Evacuation of hematoma left leg.  Operative findings as follows:  #1 several small hematoma collections within saphenectomy incision site and above-knee popliteal incision site no active bleeding noted #2 #10 flat Jackson-Pratt drain placed in the above-knee popliteal space as well as the saphenectomy bed.  The remainder of the hospital course consisted of increasing mobilization and increasing intake of solids without difficulty.  She was discharged home with PT home health and follow up visit with Dr. Darrick Penna.  CBC    Component Value Date/Time   WBC 6.5 11/03/2013 0534   RBC 4.00 11/03/2013 0534   HGB 12.0 11/03/2013 0534   HCT 36.4 11/03/2013 0534   PLT 421* 11/03/2013 0534  MCV 91.0 11/03/2013 0534   MCH 30.0 11/03/2013 0534   MCHC 33.0 11/03/2013 0534   RDW 13.8 11/03/2013 0534   LYMPHSABS 2.8 09/26/2007 0843   MONOABS 0.4 09/26/2007 0843   EOSABS 0.2 09/26/2007 0843   BASOSABS 0.0 09/26/2007 0843     BMET    Component Value Date/Time   NA 138 10/28/2013 0740   K 3.5 10/28/2013 0740   CL 104 10/28/2013 0740   CO2 25 10/28/2013 0740   GLUCOSE 170* 10/28/2013 0740   BUN 19 10/28/2013 0740   CREATININE 0.68 10/28/2013 0740   CALCIUM 8.3* 10/28/2013 0740   GFRNONAA 88* 10/28/2013 0740   GFRAA >90 10/28/2013 0740      Discharge Instructions:   The patient is discharged to home with extensive instructions on wound care and progressive ambulation.  They are instructed not to drive or perform any heavy lifting  until returning to see the physician in his office.  Discharge Orders   Future Appointments Provider Department Dept Phone   11/06/2013 8:30 AM Sherren Kerns, MD [facility] [phone]   Future Orders Complete By Expires   Call MD for:  redness, tenderness, or signs of infection (pain, swelling, bleeding, redness, odor or green/yellow discharge around incision site)  As directed    Call MD for:  severe or increased pain, loss or decreased feeling  in affected limb(s)  As directed    Call MD for:  temperature >100.5  As directed    Discharge wound care:  As directed    Comments:     Wash the groin wound with soap and water daily and pat dry. (No tub bath-only shower)  Then put a dry gauze or washcloth there to keep this area dry daily and as needed.  Do not use Vaseline or neosporin on your incisions.  Only use soap and water on your incisions and then protect and keep dry.   Driving Restrictions  As directed    Comments:     No driving for 2 weeks   Lifting restrictions  As directed    Comments:     No lifting for 4 weeks   Resume previous diet  As directed       Discharge Diagnosis:  PVD infected l thigh  Secondary Diagnosis: Patient Active Problem List   Diagnosis Date Noted  . Ischemic leg 10/21/2013  . Peripheral vascular disease, unspecified 09/18/2013  . Preop cardiovascular exam 09/08/2013  . Hypertension   . Hyperlipidemia   . Peripheral vascular disease   . Fibromyalgia   . GERD (gastroesophageal reflux disease)   . Depression with anxiety   . Pain in limb 04/03/2013  . Atherosclerosis of native arteries of the extremities with intermittent claudication 08/01/2012  . Claudication 12/28/2011   Past Medical History  Diagnosis Date  . Tobacco abuse   . Hypertension   . Hyperlipidemia   . Peripheral vascular disease   . Fibromyalgia   . GERD (gastroesophageal reflux disease)   . Depression with anxiety   . Diabetes mellitus      Type II  . Bronchitis   . Bulging disc     in neck  . Pneumonia   . Overactive bladder        Medication List         aspirin EC 81 MG tablet  Take 81 mg by mouth daily.     atenolol 50 MG tablet  Commonly known as:  TENORMIN  Take 50  mg by mouth daily.     AZOR 5-40 MG per tablet  Generic drug:  amLODipine-olmesartan  Take 1 tablet by mouth daily.     benzonatate 200 MG capsule  Commonly known as:  TESSALON  Take 200 mg by mouth daily.     Dextromethorphan-Guaifenesin 5-100 MG/5ML Liqd  Take 5 mLs by mouth every 4 (four) hours as needed (congestion).     guaiFENesin 600 MG 12 hr tablet  Commonly known as:  MUCINEX  Take by mouth 2 (two) times daily.     PROAIR HFA 108 (90 BASE) MCG/ACT inhaler  Generic drug:  albuterol  Inhale 2 puffs into the lungs every 4 (four) hours as needed for wheezing or shortness of breath.     rosuvastatin 5 MG tablet  Commonly known as:  CRESTOR  Take 2.5 mg by mouth daily.     sitaGLIPtin-metformin 50-1000 MG per tablet  Commonly known as:  JANUMET  Take 1 tablet by mouth 2 (two) times daily with a meal.     temazepam 30 MG capsule  Commonly known as:  RESTORIL  Take 30 mg by mouth at bedtime as needed for sleep.     TRAVATAN Z 0.004 % Soln ophthalmic solution  Generic drug:  Travoprost (BAK Free)  Place 1 drop into both eyes at bedtime.        Roxicodone #30 No Refill  Disposition: home  Patient's condition: is Good  Follow up: 1. Dr. Darrick Penna in 2 weeks   Doreatha Massed, PA-C [facility] [phone] 10/28/2013  9:35 AM  - For VQI Registry use --- Instructions: Press F2 to tab through selections.  Delete question if not applicable.   Post-op:  Wound infection: No  Graft infection: No  Transfusion: Yes  If yes, 2 units given New Arrhythmia: No Ipsilateral amputation: No, [ ]  Minor, [ ]  BKA, [ ]  AKA Discharge patency: [ x] Primary, [ ]  Primary assisted, [ ]  Secondary, [ ]   Occluded Patency judged by: [ ]  Dopper only, [ ]  Palpable graft pulse, [ x] Palpable distal pulse, [ ]  ABI inc. > 0.15, [ ]  Duplex Discharge ABI: R 0.59, L 0.61 Discharge TBI: R , L  D/C Ambulatory Status: Ambulatory  Complications: MI: No, [ ]  Troponin only, [ ]  EKG or Clinical CHF: No Resp failure:No, [ ]  Pneumonia, [ ]  Ventilator Chg in renal function: No, [ ]  Inc. Cr > 0.5, [ ]  Temp. Dialysis, [ ]  Permanent dialysis Stroke: No, [ ]  Minor, [ ]  Major Return to OR: No  Reason for return to OR: [ ]  Bleeding, [ ]  Infection, [ ]  Thrombosis, [ ]  Revision  Discharge medications: Statin use:  Yes ASA use:  Yes Plavix use:  No  for medical reason not indicated Beta blocker use: Yes Coumadin use: No  for medical reason not indicated.

## 2013-10-28 NOTE — Progress Notes (Signed)
[facility] [place]  Subjective  - Leg more sore today   Objective 97/55 95 98.1 F (36.7 C) (Oral) 17 100%  Intake/Output Summary (Last 24 hours) at 10/28/13 0737 Last data filed at 10/28/13 0057  Gross per 24 hour  Intake    720 ml  Output    575 ml  Net    145 ml   Left leg edema persistant, left foot warm, tense thigh with blisters from tape  Assessment/Planning: S/p fem pop with thigh hematoma.  Will return to OR today for I and D Check cbc bmet, type and screen NPO as of now  Yazeed Pryer E 10/28/2013 7:37 AM --  Laboratory Lab Results: No results found for this basename: WBC, HGB, HCT, PLT,  in the last 72 hours BMET  Recent Labs  10/28/13 0500  CREATININE 0.70    COAG Lab Results  Component Value Date   INR 0.88 09/29/2013   INR 0.9 05/14/2008   No results found for this basename: PTT

## 2013-10-28 NOTE — Anesthesia Postprocedure Evaluation (Signed)
  Anesthesia Post-op Note  Patient: Jill Higgins  Procedure(s) Performed: Procedure(s): left inner thigh DEBRIDEMENT (Left)  Patient Location: PACU  Anesthesia Type:General  Level of Consciousness: awake, alert  and oriented  Airway and Oxygen Therapy: Patient Spontanous Breathing and Patient connected to nasal cannula oxygen  Post-op Pain: mild  Post-op Assessment: Post-op Vital signs reviewed, Patient's Cardiovascular Status Stable, Respiratory Function Stable, Patent Airway and Pain level controlled  Post-op Vital Signs: stable  Complications: No apparent anesthesia complications

## 2013-10-28 NOTE — Preoperative (Signed)
Beta Blockers   Reason not to administer Beta Blockers:Not Applicable 

## 2013-10-28 NOTE — Anesthesia Preprocedure Evaluation (Signed)
Anesthesia Evaluation  Patient identified by MRN, date of birth, ID band Patient awake    Reviewed: Allergy & Precautions, H&P , NPO status , Patient's Chart, lab work & pertinent test results, reviewed documented beta blocker date and time   Airway Mallampati: II TM Distance: >3 FB Neck ROM: full    Dental   Pulmonary pneumonia -, resolved, Current Smoker,  breath sounds clear to auscultation        Cardiovascular hypertension, On Medications and On Home Beta Blockers + Peripheral Vascular Disease Rhythm:regular     Neuro/Psych PSYCHIATRIC DISORDERS  Neuromuscular disease    GI/Hepatic Neg liver ROS, GERD-  Medicated and Controlled,  Endo/Other  diabetes, Insulin Dependent  Renal/GU negative Renal ROS  negative genitourinary   Musculoskeletal   Abdominal   Peds  Hematology negative hematology ROS (+)   Anesthesia Other Findings See surgeon's H&P   Reproductive/Obstetrics negative OB ROS                           Anesthesia Physical Anesthesia Plan  ASA: III  Anesthesia Plan: General   Post-op Pain Management:    Induction: Intravenous  Airway Management Planned: LMA and Oral ETT  Additional Equipment:   Intra-op Plan:   Post-operative Plan: Extubation in OR  Informed Consent: I have reviewed the patients History and Physical, chart, labs and discussed the procedure including the risks, benefits and alternatives for the proposed anesthesia with the patient or authorized representative who has indicated his/her understanding and acceptance.   Dental Advisory Given  Plan Discussed with: CRNA and Surgeon  Anesthesia Plan Comments:         Anesthesia Quick Evaluation

## 2013-10-28 NOTE — Transfer of Care (Signed)
Immediate Anesthesia Transfer of Care Note  Patient: Jill Higgins  Procedure(s) Performed: Procedure(s): left inner thigh DEBRIDEMENT (Left)  Patient Location: PACU  Anesthesia Type:General  Level of Consciousness: awake, alert  and oriented  Airway & Oxygen Therapy: Patient Spontanous Breathing  Post-op Assessment: Report given to PACU RN  Post vital signs: Reviewed and stable  Complications: No apparent anesthesia complications

## 2013-10-28 NOTE — Progress Notes (Signed)
Procedure: Evacuation of hematoma left leg  Preoperative diagnosis: Hematoma left leg  Postoperative diagnosis: Same  Anesthesia: Gen.  Assistant: Lianne Cure PA-C  Operative findings: #1 several small hematoma collections within saphenectomy incision site and above-knee popliteal incision site no active bleeding noted #2 #10 flat Jackson-Pratt drain placed in the above-knee popliteal space as well as the saphenectomy bed  Operative details: After obtaining informed consent, the patient was taken to the operating room. The patient was placed in supine position on the operating room table. After induction of general anesthesia and endotracheal intubation, a Foley catheter was placed. Next the patient's entire left lower extremity was prepped and draped in usual sterile fashion. Staples were removed from the above-knee popliteal incision as well as the dye incisions from the previous saphenectomy. The groin incision and the below-knee incision were not reopened. Upon taking the sutures in the subcutaneous tissues each incision had a organized hematoma collection within it. This was all evacuated and thoroughly irrigated with normal saline solution. The above-knee popliteal space was explored and the bypass graft was located and examined. There was no active bleeding from this. The anastomosis was not bleeding. The graft was patent with a good pulse within it. Several more liters of warm saline solution were used to irrigate all of the wounds. Each incision was then closed with multiple layers of running 3-0 Vicryl suture in the subcutaneous tissue. A 10 flat Jackson-Pratt drain was brought out through a separate stab incision and placed in the deep above-knee popliteal space. This was sutured to the skin with a nylon suture. An additional Jackson-Pratt drain was placed in the bed of the saphenectomy incision under all 3 incisions that had been reopened. This was also brought out through a separate stab  incision and sutured to skin with a nylon stitch. The skin of all incisions were stapled. The patient tolerated the procedure well and there were no complications. Instrument sponge and needle counts were correct at the end of the case. The patient was taken to the recovery room in stable condition.  Fabienne Bruns, MD [facility] [place] Office: [phone] Pager: [phone]

## 2013-10-29 LAB — GLUCOSE, CAPILLARY
Glucose-Capillary: 141 mg/dL — ABNORMAL HIGH (ref 70–99)
Glucose-Capillary: 122 mg/dL — ABNORMAL HIGH (ref 70–99)
Glucose-Capillary: 183 mg/dL — ABNORMAL HIGH (ref 70–99)
Glucose-Capillary: 116 mg/dL — ABNORMAL HIGH (ref 70–99)

## 2013-10-29 LAB — CBC
HCT: 24.5 % — ABNORMAL LOW (ref 36.0–46.0)
HCT: 31.5 % — ABNORMAL LOW (ref 36.0–46.0)
Hemoglobin: 7.9 g/dL — ABNORMAL LOW (ref 12.0–15.0)
Hemoglobin: 10.8 g/dL — ABNORMAL LOW (ref 12.0–15.0)
MCH: 29.8 pg (ref 26.0–34.0)
MCH: 30.6 pg (ref 26.0–34.0)
MCHC: 32.2 g/dL (ref 30.0–36.0)
MCHC: 34.3 g/dL (ref 30.0–36.0)
MCV: 92.5 fL (ref 78.0–100.0)
MCV: 89.2 fL (ref 78.0–100.0)
Platelets: 303 10*3/uL (ref 150–400)
Platelets: 303 10*3/uL (ref 150–400)
RBC: 2.65 MIL/uL — ABNORMAL LOW (ref 3.87–5.11)
RBC: 3.53 MIL/uL — ABNORMAL LOW (ref 3.87–5.11)
RDW: 14.3 % (ref 11.5–15.5)
RDW: 14.9 % (ref 11.5–15.5)
WBC: 8.2 10*3/uL (ref 4.0–10.5)
WBC: 7.3 10*3/uL (ref 4.0–10.5)

## 2013-10-29 LAB — PREPARE RBC (CROSSMATCH)

## 2013-10-29 MED ORDER — FLEET ENEMA 7-19 GM/118ML RE ENEM
1.0000 | ENEMA | Freq: Once | RECTAL | Status: AC
  Administered 2013-10-29: 1 via RECTAL
  Filled 2013-10-29: qty 1

## 2013-10-29 MED ORDER — BISACODYL 5 MG PO TBEC
5.0000 mg | DELAYED_RELEASE_TABLET | Freq: Every day | ORAL | Status: DC | PRN
  Administered 2013-10-29: 5 mg via ORAL
  Filled 2013-10-29: qty 1

## 2013-10-29 NOTE — Progress Notes (Signed)
Physical Therapy Treatment Patient Details Name: Jill Higgins MRN: [id number] DOB: [birth year] Today's Date: 10/29/2013 Time: 6213-0865 PT Time Calculation (min): 24 min  PT Assessment / Plan / Recommendation  History of Present Illness Adm for Lt fem-pop bypass after failed PTCA and stents. Post-op Lt thigh hematoma. Could only walk ~60 steps PTA due to pain. PMHx DM, HTN   PT Comments   Patient progressing with gait tolerance.  Still very slow and painful with left LE weight bearing limiting step length.  Patient eager to improve and cooperative with therapy.  Good rehab potential.  Follow Up Recommendations  SNF     Does the patient have the potential to tolerate intense rehabilitation   N/A  Barriers to Discharge  None      Equipment Recommendations  Rolling walker with 5" wheels    Recommendations for Other Services  None  Frequency Min 3X/week   Progress towards PT Goals Progress towards PT goals: Progressing toward goals  Plan Current plan remains appropriate    Precautions / Restrictions Precautions Precautions: Fall   Pertinent Vitals/Pain Left LE pain 4-5/10    Mobility  Bed Mobility Bed Mobility: Scooting to HOB Sit to Supine: 4: Min assist Scooting to [facility]: 5: Supervision;With rail Details for Bed Mobility Assistance: assist to supine for left LE Transfers Sit to Stand: From chair/3-in-1;With armrests;From bed;4: Min assist;With upper extremity assist Stand to Sit: To bed;With upper extremity assist;4: Min assist Details for Transfer Assistance: cues for left LE management Ambulation/Gait Ambulation/Gait Assistance: 4: Min assist Ambulation Distance (Feet): 80 Feet Assistive device: Rolling walker Ambulation/Gait Assistance Details: multiple cues for staying inside walker to increase walker support and decrease left weight bearing Gait Pattern: Decreased step length - left;Decreased stride length;Antalgic;Step-to pattern Gait velocity: extremely slow       PT Goals (current goals can now be found in the care plan section)    Visit Information  Last PT Received On: 10/29/13 Assistance Needed: +1 History of Present Illness: Adm for Lt fem-pop bypass after failed PTCA and stents. Post-op Lt thigh hematoma. Could only walk ~60 steps PTA due to pain. PMHx DM, HTN    Subjective Data      Cognition  Cognition Arousal/Alertness: Awake/alert Behavior During Therapy: WFL for tasks assessed/performed Overall Cognitive Status: Within Functional Limits for tasks assessed    Balance  Static Standing Balance Static Standing - Balance Support: During functional activity;No upper extremity supported Static Standing - Level of Assistance: 5: Stand by assistance Static Standing - Comment/# of Minutes: bending and reaching to look for items at sink and in drawer of nightstand  End of Session PT - End of Session Equipment Utilized During Treatment: Gait belt Activity Tolerance: Patient tolerated treatment well Patient left: in bed;with call bell/phone within reach Nurse Communication: Other (comment) (left antecubital IV site pain)   GP     [facility] 10/29/2013, 3:23 PM Sheran Lawless, PT [phone] 10/29/2013

## 2013-10-29 NOTE — Progress Notes (Signed)
Clinical Social Work Department CLINICAL SOCIAL WORK PLACEMENT NOTE 10/29/2013  Patient:  Jill Higgins, Jill Higgins  Account Number:  [id number] Admit date:  10/21/2013  Clinical Social Worker:  Leron Croak, CLINICAL SOCIAL WORKER  Date/time:  10/29/2013 03:59 PM  Clinical Social Work is seeking post-discharge placement for this patient at the following level of care:   SKILLED NURSING   (*CSW will update this form in Epic as items are completed)   10/29/2013  Patient/family provided with [facility] Department of Clinical Social Work's list of facilities offering this level of care within the geographic area requested by the patient (or if unable, by the patient's family).  10/29/2013  Patient/family informed of their freedom to choose among providers that offer the needed level of care, that participate in Medicare, Medicaid or managed care program needed by the patient, have an available bed and are willing to accept the patient.  10/29/2013  Patient/family informed of [facility]' ownership interest in [facility], as well as of the fact that they are under no obligation to receive care at this facility.  PASARR submitted to EDS on 10/29/2013 PASARR number received from EDS on 10/29/2013  FL2 transmitted to all facilities in geographic area requested by pt/family on  10/29/2013 FL2 transmitted to all facilities within larger geographic area on 10/29/2013  Patient informed that his/her managed care company has contracts with or will negotiate with  certain facilities, including the following:     Patient/family informed of bed offers received:   Patient chooses bed at  Physician recommends and patient chooses bed at    Patient to be transferred to  on   Patient to be transferred to facility by   The following physician request were entered in Epic:   Additional Comments:   Lenord Carbo  [facility]  4N 1[address]16;  6N1[address]16 Phone: [phone]

## 2013-10-29 NOTE — Progress Notes (Addendum)
[facility] [place]  Subjective  - Doing well.  Her biggest complaint is being constipated.   Objective 108/55 64 99.8 F (37.7 C) (Oral) 18 100%  Intake/Output Summary (Last 24 hours) at 10/29/13 0800 Last data filed at 10/29/13 0507  Gross per 24 hour  Intake   1250 ml  Output   1152 ml  Net     98 ml    Dressing clean and dry. Drains in place Distally skin warm to touch  Assessment/Planning: POD #1 Evacuation hematoma left thigh incisions 250 total output since surgery. Hgb 7.8 transfusion order placed for 2 units PRBC   Higgins, Jill MAUREEN 10/29/2013 8:00 AM --  Laboratory Lab Results:  Recent Labs  10/28/13 0740 10/29/13 0545  WBC 7.8 8.2  HGB 8.3* 7.9*  HCT 25.2* 24.5*  PLT 296 303   BMET  Recent Labs  10/28/13 0500 10/28/13 0740  NA  --  138  K  --  3.5  CL  --  104  CO2  --  25  GLUCOSE  --  170*  BUN  --  19  CREATININE 0.70 0.68  CALCIUM  --  8.3*    COAG Lab Results  Component Value Date   INR 0.88 09/29/2013   INR 0.9 05/14/2008   No results found for this basename: PTT      Left foot warm.  Still with some edema Rx constipation Will take down dressings and probably d/c drains tomorrow if swelling not improved will get duplex to rule out DVT  Fabienne Bruns, MD [facility] [place] Office: [phone] Pager: [phone]

## 2013-10-29 NOTE — Progress Notes (Signed)
The Chaplain stopped by the patient's room but the patient was sleep. Chaplain will return to the patient's room when she is awake.  Chaplain Bryson Ha Indiyah Paone

## 2013-10-29 NOTE — Progress Notes (Signed)
2nd unit of blood transfusion is complete. No complications. Will continue to monitor. Stanton Kidney R

## 2013-10-29 NOTE — Progress Notes (Signed)
1st unit of blood transfusion complete. Unit transfused with no complications. VS stable. 2nd unit of blood verified with another RN. Stayed with patient for first 15 minutes. No complications. Stanton Kidney R

## 2013-10-29 NOTE — Progress Notes (Signed)
OT Cancellation Note  Patient Details Name: TREENA COSMAN MRN: [id number] DOB: [birth year]   Cancelled Treatment:    Reason Eval/Treat Not Completed: Medical issues which prohibited therapy - pt receiving blood this am;  Then was working with PT this pm. Will reattempt.   Jeani Hawking M [phone] 10/29/2013, 2:47 PM

## 2013-10-29 NOTE — Progress Notes (Addendum)
Blood consent signed. VS are stable. Blood verified with another RN. Started blood transfusion. Will remain with patient for first 15 minutes. Stanton Kidney R

## 2013-10-29 NOTE — Progress Notes (Signed)
Patients BP 88/52. Patient asymptomatic. MD made aware. No orders at this time. Will continue to monitor and assess BP frequently. Stanton Kidney R

## 2013-10-29 NOTE — Progress Notes (Signed)
Clinical Social Work Department BRIEF PSYCHOSOCIAL ASSESSMENT 10/29/2013  Patient:  Jill Higgins, Jill Higgins     Account Number:  [id number]     Admit date:  10/21/2013  Clinical Social Worker:  Leron Croak, CLINICAL SOCIAL WORKER  Date/Time:  10/29/2013 03:42 PM  Referred by:  Physician  Date Referred:  10/29/2013 Referred for  SNF Placement   Other Referral:   Interview type:  Patient Other interview type:    PSYCHOSOCIAL DATA Living Status:  ALONE Admitted from facility:   Level of care:   Primary support name:  Burnis Kingfisher  [phone] Primary support relationship to patient:  CHILD, ADULT Degree of support available:   Pt has a good support system    CURRENT CONCERNS Current Concerns  Post-Acute Placement   Other Concerns:    SOCIAL WORK ASSESSMENT / PLAN CSW met with Pt at the bedside to discuss recommendation for SNF placement for rehab. CSW introduced self and explained the consult and process for SNF search. CSW informed Pt that CSW could search in her county of preferrence and that she then could choose from the bed offers that are received. Pt was in agreement to completing a SNF search and stated that she would look over the listing to see if there would be a facility close to her home. CSW will search in the [facility] [place] [facility] area and present offers when available.   Assessment/plan status:  Information/Referral to [facility] Other assessment/ plan:   Information/referral to community resources:   CSW provided information about SNF placements for reference.    PATIENT'S/FAMILY'S RESPONSE TO PLAN OF CARE: Pt was appreciative for assistance with SNF search.        Leron Croak [facility]  4N 1[address]16;  [phone] Phone: [phone]

## 2013-10-30 ENCOUNTER — Encounter (HOSPITAL_COMMUNITY): Payer: Self-pay | Admitting: Vascular Surgery

## 2013-10-30 LAB — GLUCOSE, CAPILLARY
Glucose-Capillary: 146 mg/dL — ABNORMAL HIGH (ref 70–99)
Glucose-Capillary: 147 mg/dL — ABNORMAL HIGH (ref 70–99)
Glucose-Capillary: 127 mg/dL — ABNORMAL HIGH (ref 70–99)
Glucose-Capillary: 171 mg/dL — ABNORMAL HIGH (ref 70–99)

## 2013-10-30 LAB — TYPE AND SCREEN
ABO/RH(D): A POS
Antibody Screen: NEGATIVE
Unit division: 0
Unit division: 0

## 2013-10-30 NOTE — Progress Notes (Signed)
Dressing over incision was removed for Duplex procedure. MD stated ok to leave incision open to air. Dressed area where JP drains were with 4x4's due to small amount of drainage. Stanton Kidney R

## 2013-10-30 NOTE — Progress Notes (Signed)
Patient [facility] as her SNF. CSW informed [facility] to begin authorization process. CSW will continue to follow.   Sabino Niemann, MSW, [employer] [phone]

## 2013-10-30 NOTE — Progress Notes (Signed)
Patient received 0.5 of dilaudid while pixis was down. Wasted o.5 of 1.0 in sink. RN witness. Stanton Kidney R

## 2013-10-30 NOTE — Progress Notes (Signed)
VASCULAR LAB PRELIMINARY  PRELIMINARY  PRELIMINARY  PRELIMINARY  Left lower extremity venous duplex completed.    Preliminary report:  Left:  No evidence of DVT, superficial thrombosis, or Baker's cyst.  Technically limited by surgical staples and edema.  Larrisa Cravey, RVT 10/30/2013, 3:51 PM

## 2013-10-30 NOTE — Progress Notes (Signed)
Occupational Therapy Treatment Patient Details Name: Jill Higgins MRN: [id number] DOB: [birth year] Today's Date: 10/30/2013 Time: 4098-1191 OT Time Calculation (min): 29 min  OT Assessment / Plan / Recommendation  History of present illness Adm for Lt fem-pop bypass after failed PTCA and stents. Post-op Lt thigh hematoma. Could only walk ~60 steps PTA due to pain. PMHx DM, HTN   OT comments  Pt with improving independence with BADLs.  Pt. Requires mod A for LB ADLs as pain limits her ability to access Lt. LE.  Will continue to follow to maximize her safety with BADLs    Follow Up Recommendations  SNF;Supervision/Assistance - 24 hour    Barriers to Discharge       Equipment Recommendations  3 in 1 bedside comode    Recommendations for Other Services    Frequency Min 2X/week   Progress towards OT Goals Progress towards OT goals: Progressing toward goals  Plan Discharge plan remains appropriate    Precautions / Restrictions Precautions Precautions: Fall   Pertinent Vitals/Pain     ADL  Grooming: Wash/dry hands;Min guard Where Assessed - Grooming: Unsupported standing Lower Body Bathing: Moderate assistance Where Assessed - Lower Body Bathing: Supported sit to stand Lower Body Dressing: Moderate assistance Where Assessed - Lower Body Dressing: Supported sit to stand Toilet Transfer: Hydrographic surveyor Method: Sit to stand;Stand pivot Acupuncturist: Comfort height toilet Toileting - Architect and Hygiene: Supervision/safety Where Assessed - Engineer, mining and Hygiene: Standing Equipment Used: Rolling walker Transfers/Ambulation Related to ADLs: moves slowly ADL Comments: Pt attempting to ambulate to BR without assist and with cords tangeled upon therapist's enterance.  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  Visit Information  Last OT Received On: 10/30/13 Assistance Needed: +1 History of Present Illness: Adm for Lt fem-pop bypass after failed PTCA and stents. Post-op Lt thigh hematoma. Could only walk ~60 steps PTA due to pain. PMHx DM, HTN    Subjective Data      Prior Functioning       Cognition  Cognition Arousal/Alertness: Awake/alert Behavior During Therapy: WFL for tasks assessed/performed Overall Cognitive Status: Within Functional Limits for tasks assessed    Mobility  Bed Mobility Bed Mobility: Sit to Supine Sit to Supine: 5: Supervision Transfers Transfers: Sit to Stand;Stand to Sit Sit to Stand: 5: Supervision;With upper extremity assist;From bed;From toilet Stand to Sit: 5: Supervision;With upper extremity assist;To bed;To toilet Details for Transfer Assistance: supervision for safety     Exercises      Balance     End of Session OT - End of Session Equipment Utilized During Treatment: Rolling walker Activity Tolerance: Patient tolerated treatment well Patient left: in bed;with call bell/phone within reach Nurse Communication: Mobility status  GO     [name],  Ursula Alert M 10/30/2013, 1:29 PM

## 2013-10-30 NOTE — Progress Notes (Signed)
[facility] [place]  Subjective  - sore but overall better   Objective 118/58 57 98.2 F (36.8 C) (Oral) 16 100%  Intake/Output Summary (Last 24 hours) at 10/30/13 0937 Last data filed at 10/30/13 0645  Gross per 24 hour  Intake   1148 ml  Output   1845 ml  Net   -697 ml   Left foot warm Incisions all clean, blisters drying out JP drains serosanguinous Still with left leg edema  Assessment/Planning: Duplex to rule out DVT PT to work on ambulation Home when ambulatory  Aubriegh Minch E 10/30/2013 9:37 AM --  Laboratory Lab Results:  Recent Labs  10/29/13 0545 10/29/13 2219  WBC 8.2 7.3  HGB 7.9* 10.8*  HCT 24.5* 31.5*  PLT 303 303   BMET  Recent Labs  10/28/13 0500 10/28/13 0740  NA  --  138  K  --  3.5  CL  --  104  CO2  --  25  GLUCOSE  --  170*  BUN  --  19  CREATININE 0.70 0.68  CALCIUM  --  8.3*    COAG Lab Results  Component Value Date   INR 0.88 09/29/2013   INR 0.9 05/14/2008   No results found for this basename: PTT

## 2013-10-31 LAB — GLUCOSE, CAPILLARY
Glucose-Capillary: 180 mg/dL — ABNORMAL HIGH (ref 70–99)
Glucose-Capillary: 150 mg/dL — ABNORMAL HIGH (ref 70–99)
Glucose-Capillary: 141 mg/dL — ABNORMAL HIGH (ref 70–99)
Glucose-Capillary: 111 mg/dL — ABNORMAL HIGH (ref 70–99)

## 2013-10-31 MED ORDER — BISACODYL 10 MG RE SUPP: 10.0000 mg | Freq: Every day | RECTAL | Status: DC | PRN

## 2013-10-31 MED ORDER — MAGNESIUM CITRATE PO SOLN
300.0000 mL | Freq: Once | ORAL | Status: AC
  Administered 2013-10-31: 296 mL via ORAL
  Filled 2013-10-31: qty 592

## 2013-10-31 NOTE — Progress Notes (Addendum)
[facility] Progress Note  10/31/2013 9:31 AM 3 Days Post-Op  Subjective:  Still complains of soreness  Afebrile VSS  Filed Vitals:   10/31/13 0604  BP: 108/60  Pulse: 58  Temp: 98.1 F (36.7 C)  Resp: 18    Physical Exam: Incisions:  Staples are in tact-minimal drainage Extremities:  Left foot is warm and well perfused.  CBC    Component Value Date/Time   WBC 7.3 10/29/2013 2219   RBC 3.53* 10/29/2013 2219   HGB 10.8* 10/29/2013 2219   HCT 31.5* 10/29/2013 2219   PLT 303 10/29/2013 2219   MCV 89.2 10/29/2013 2219   MCH 30.6 10/29/2013 2219   MCHC 34.3 10/29/2013 2219   RDW 14.9 10/29/2013 2219   LYMPHSABS 2.8 09/26/2007 0843   MONOABS 0.4 09/26/2007 0843   EOSABS 0.2 09/26/2007 0843   BASOSABS 0.0 09/26/2007 0843    BMET    Component Value Date/Time   NA 138 10/28/2013 0740   K 3.5 10/28/2013 0740   CL 104 10/28/2013 0740   CO2 25 10/28/2013 0740   GLUCOSE 170* 10/28/2013 0740   BUN 19 10/28/2013 0740   CREATININE 0.68 10/28/2013 0740   CALCIUM 8.3* 10/28/2013 0740   GFRNONAA 88* 10/28/2013 0740   GFRAA >90 10/28/2013 0740    INR    Component Value Date/Time   INR 0.88 09/29/2013 1358     Intake/Output Summary (Last 24 hours) at 10/31/13 0931 Last data filed at 10/31/13 0810  Gross per 24 hour  Intake    720 ml  Output    900 ml  Net   -180 ml     Assessment:  67 y.o. female is s/p:  Left femoral to above-knee popliteal bypass with non-reversed greater saphenous vein, popliteal endarterectomy, vein patch angioplasty of popliteal artery, intraoperative arteriogram 10 Days Post-Op And Evacuation of hematoma of left leg  3 Days Post-Op  Plan: -pt continues to have pain-will mostly likely be here over the weekend for more PT and increasing ambulation and mobilization. -DVT prophylaxis:  Continue SCD's for DVT prophylaxis as pt has had bleeding into her thigh -case management has been ordered for [facility] PT   Doreatha Massed,  PA-C [facility] [phone] 10/31/2013 9:31 AM    Still slow to mobilize Duplex negative for DVT Swelling persists but improved Still with some constipation Will continue to ambulate over the weekend  Hopefully home 11/03/13  Fabienne Bruns, MD [facility] [place] Office: [phone] Pager: [phone]

## 2013-10-31 NOTE — Progress Notes (Signed)
OT Cancellation Note  Patient Details Name: Jill Higgins MRN: [id number] DOB: June 08, 1946   Cancelled Treatment:    Reason Eval/Treat Not Completed: Attempted x 2 over 40 mins - pt eating dinner.  Will follow up on Monday.  Jeani Hawking M [phone] 10/31/2013, 5:00 PM

## 2013-10-31 NOTE — Progress Notes (Signed)
Occupational Therapy Treatment Patient Details Name: Jill Higgins MRN: [id number] DOB: [birth year] Today's Date: 10/31/2013 Time: 4098-1191 OT Time Calculation (min): 53 min  OT Assessment / Plan / Recommendation  History of present illness Adm for Lt fem-pop bypass after failed PTCA and stents. Post-op Lt thigh hematoma. Could only walk ~60 steps PTA due to pain. PMHx DM, HTN   OT comments  Pt making great progress.  She is now performing BADLs at supervision level.  Her plan is now to discharge home with intermittent supervision/assist.  Recommend HHOT  Follow Up Recommendations  Home health OT;Supervision - Intermittent    Barriers to Discharge       Equipment Recommendations  3 in 1 bedside comode    Recommendations for Other Services    Frequency Min 2X/week   Progress towards OT Goals Progress towards OT goals: Goals met and updated - see care plan  Plan Discharge plan needs to be updated    Precautions / Restrictions Precautions Precautions: Fall   Pertinent Vitals/Pain     ADL  Grooming: Wash/dry hands;Supervision/safety Where Assessed - Grooming: Unsupported standing Lower Body Bathing: Supervision/safety Where Assessed - Lower Body Bathing: Unsupported sit to stand Lower Body Dressing: Supervision/safety Where Assessed - Lower Body Dressing: Unsupported sit to stand Toilet Transfer: Supervision/safety Toilet Transfer Method: Sit to stand;Stand pivot Acupuncturist: Comfort height toilet;Grab bars Toileting - Clothing Manipulation and Hygiene: Supervision/safety Where Assessed - Engineer, mining and Hygiene: Standing Equipment Used: Rolling walker Transfers/Ambulation Related to ADLs: Supervision ADL Comments: Pt now able to access Lt. LE for LB ADLs.  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 Post-op Lt thigh hematoma. Could only walk ~60 steps PTA due to pain. PMHx DM, HTN    Subjective Data      Prior Functioning       Cognition  Cognition Arousal/Alertness: Awake/alert Behavior During Therapy: WFL for tasks assessed/performed Overall Cognitive Status: Within Functional Limits for tasks assessed    Mobility  Bed Mobility Bed Mobility: Sit to Supine Sit to Supine: 6: Modified independent (Device/Increase time) Details for Bed Mobility Assistance: increased time Transfers Transfers: Sit to Stand;Stand to Sit Sit to Stand: 6: Modified independent (Device/Increase time) Stand to Sit: 6: Modified independent (Device/Increase time)    Exercises      Balance     End of Session OT - End of Session Equipment Utilized During Treatment: Rolling walker Activity Tolerance: Patient tolerated treatment well Patient left: in bed;with call bell/phone within reach  GO     Xzavien Harada, Ursula Alert M  10/31/2013, 6:05 PM

## 2013-11-01 LAB — GLUCOSE, CAPILLARY
Glucose-Capillary: 125 mg/dL — ABNORMAL HIGH (ref 70–99)
Glucose-Capillary: 154 mg/dL — ABNORMAL HIGH (ref 70–99)
Glucose-Capillary: 143 mg/dL — ABNORMAL HIGH (ref 70–99)
Glucose-Capillary: 134 mg/dL — ABNORMAL HIGH (ref 70–99)

## 2013-11-01 MED ORDER — ENOXAPARIN SODIUM 40 MG/0.4ML ~~LOC~~ SOLN
40.0000 mg | SUBCUTANEOUS | Status: DC
  Administered 2013-11-01 – 2013-11-03 (×3): 40 mg via SUBCUTANEOUS
  Filled 2013-11-01 (×3): qty 0.4

## 2013-11-01 NOTE — Progress Notes (Addendum)
[facility] [place]  Subjective  - Soreness and swelling.   Objective 103/63 51 97.6 F (36.4 C) (Oral) 17 97%  Intake/Output Summary (Last 24 hours) at 11/01/13 0745 Last data filed at 11/01/13 0422  Gross per 24 hour  Intake    840 ml  Output   1200 ml  Net   -360 ml    Incision clean and dry no active drainage. Left foot warm to touch, well perfused.  Sensation intact. Mod. edema upper thigh, no new blister formation  Assessment/Planning: POD # 4 Evacuation of hematoma of left leg  Left femoral to above-knee popliteal bypass with non-reversed greater saphenous vein, popliteal endarterectomy, vein patch angioplasty of popliteal artery, intraoperative arteriogram  11 Days Post-Op   Neg for DVT per venous doppler Ambulate, elevation when at rest Continue current plan  Clinton Gallant [facility] 11/01/2013 7:45 AM --  Laboratory Lab Results:  Recent Labs  10/29/13 2219  WBC 7.3  HGB 10.8*  HCT 31.5*  PLT 303   BMET No results found for this basename: NA, K, CL, CO2, GLUCOSE, BUN, CREATININE, CALCIUM,  in the last 72 hours  COAG Lab Results  Component Value Date   INR 0.88 09/29/2013   INR 0.9 05/14/2008   No results found for this basename: PTT   Addendum  I have independently interviewed and examined the patient, and I agree with the physician assistant's findings.  Continued poor mobility.  Continued thigh swelling without drain.  Inc c/d/i.  Left warm.  ABI improved from 0.52 to 0.62.  Ambulation continues to be an issue. Start Lovenox at DVT prophylaxis dose.  Leonides Sake, MD [facility] [place] Office: [phone] Pager: [phone]  11/01/2013, 9:07 AM

## 2013-11-01 NOTE — Progress Notes (Signed)
Physical Therapy Treatment Patient Details Name: Jill Higgins MRN: [id number] DOB: [birth year] Today's Date: 11/01/2013 Time: 4403-4742 PT Time Calculation (min): 30 min  PT Assessment / Plan / Recommendation  History of Present Illness Adm for Lt fem-pop bypass after failed PTCA and stents. Post-op Lt thigh hematoma. Could only walk ~60 steps PTA due to pain. PMHx DM, HTN   PT Comments   Pt progressing well and feels she can return home. She states that she will have more help than she needs with family and friends. Pt progressing well with mobility.   Follow Up Recommendations  Home health PT     Does the patient have the potential to tolerate intense rehabilitation     Barriers to Discharge        Equipment Recommendations  Other (comment) (Pt to use her mother's 4 wheeled walker)    Recommendations for Other Services    Frequency Min 3X/week   Progress towards PT Goals Progress towards PT goals: Progressing toward goals  Plan Discharge plan needs to be updated    Precautions / Restrictions Precautions Precautions: Fall Restrictions Weight Bearing Restrictions: No   Pertinent Vitals/Pain 4/10 L leg pain with mobility    Mobility  Bed Mobility Bed Mobility: Supine to Sit Supine to Sit: 6: Modified independent (Device/Increase time);With rails Sitting - Scoot to Edge of Bed: 7: Independent Transfers Transfers: Sit to Stand;Stand to Sit Sit to Stand: 5: Supervision Stand to Sit: 5: Supervision Details for Transfer Assistance: Cues for safest technique Ambulation/Gait Ambulation/Gait Assistance: 5: Supervision Ambulation Distance (Feet): 100 Feet Assistive device: Rolling walker Ambulation/Gait Assistance Details: Cues to not leave RW when in tight spaces Gait Pattern: Decreased stride length;Antalgic;Step-to pattern Gait velocity: 0.41 ft/second    Exercises General Exercises - Lower Extremity Ankle Circles/Pumps: AROM;Left;10 reps Long Arc Quad: AROM;Left;5  reps;Seated   PT Diagnosis:    PT Problem List:   PT Treatment Interventions:     PT Goals (current goals can now be found in the care plan section)    Visit Information  Last PT Received On: 11/01/13 Assistance Needed: +1 History of Present Illness: Adm for Lt fem-pop bypass after failed PTCA and stents. Post-op Lt thigh hematoma. Could only walk ~60 steps PTA due to pain. PMHx DM, HTN    Subjective Data      Cognition  Cognition Arousal/Alertness: Awake/alert Behavior During Therapy: WFL for tasks assessed/performed Overall Cognitive Status: Within Functional Limits for tasks assessed    Balance  Static Standing Balance Static Standing - Balance Support: During functional activity;No upper extremity supported (when putting gown on had post LOB which she self corrected)  End of Session PT - End of Session Equipment Utilized During Treatment: Gait belt Activity Tolerance: Patient tolerated treatment well Patient left: in chair;with call bell/phone within reach Nurse Communication: Other (comment) (Nurse observed pt ambulating in hallway)   GP     [name] 11/01/2013, 9:42 AM [name], PT  [phone] 11/01/2013

## 2013-11-01 NOTE — Plan of Care (Signed)
Problem: Acute Rehab PT Goals(only PT should resolve) Goal: Pt Will Go Up/Down Stairs Pt reports she does not have any stairs at her house

## 2013-11-01 NOTE — Progress Notes (Signed)
Pt ambulated 50 ft with RW and minimal assist.  Encouraged to go further but unmotivated at this time. Complains of 'throbbing' in left leg, but declines pain medicine until after dinner.  Knows she needs to walk again today.  To bed with call bell in reach and friend at bedside.  Will con't plan of care.

## 2013-11-02 LAB — GLUCOSE, CAPILLARY
Glucose-Capillary: 137 mg/dL — ABNORMAL HIGH (ref 70–99)
Glucose-Capillary: 158 mg/dL — ABNORMAL HIGH (ref 70–99)
Glucose-Capillary: 146 mg/dL — ABNORMAL HIGH (ref 70–99)
Glucose-Capillary: 157 mg/dL — ABNORMAL HIGH (ref 70–99)

## 2013-11-02 NOTE — Progress Notes (Addendum)
   Daily Progress Note  Assessment/Planning: POD #12, L fem-AK pop bypass w/ ips NR GSV, pop EA w/ vein patch angioplasty, POD #5 s/p L hematoma evac   Poor mobilization to date  Needs better ambulation before discharge candidate  Continue leg elevation  ABI noted  Subjective  - 5 Days Post-Op  Continue pain behind L knee  Objective Filed Vitals:   10/31/13 2145 11/01/13 0421 11/01/13 1934 11/02/13 0550  BP: 138/66 103/63 129/65 113/68  Pulse: 57 51 60 52  Temp: 98.1 F (36.7 C) 97.6 F (36.4 C) 98.2 F (36.8 C) 98.3 F (36.8 C)  TempSrc: Oral Oral Oral Oral  Resp: 18 17 18 20   Height:      Weight:      SpO2: 100% 97% 96% 93%    Intake/Output Summary (Last 24 hours) at 11/02/13 0849 Last data filed at 11/02/13 0647  Gross per 24 hour  Intake    240 ml  Output   1400 ml  Net  -1160 ml    PULM  CTAB CV  RRR GI  soft, NTND VASC  Left thigh swollen, inc c/d/i, warm foot  Laboratory CBC    Component Value Date/Time   WBC 7.3 10/29/2013 2219   HGB 10.8* 10/29/2013 2219   HCT 31.5* 10/29/2013 2219   PLT 303 10/29/2013 2219    BMET    Component Value Date/Time   NA 138 10/28/2013 0740   K 3.5 10/28/2013 0740   CL 104 10/28/2013 0740   CO2 25 10/28/2013 0740   GLUCOSE 170* 10/28/2013 0740   BUN 19 10/28/2013 0740   CREATININE 0.68 10/28/2013 0740   CALCIUM 8.3* 10/28/2013 0740   GFRNONAA 88* 10/28/2013 0740   GFRAA >90 10/28/2013 0740    Leonides Sake, MD [facility] Office: [phone] Pager: [phone]  11/02/2013, 8:49 AM

## 2013-11-02 NOTE — Progress Notes (Signed)
Pt ambulated 52ft with RW and minimal verbal cues.   No complaints.  Gait slow but steady.  To bed after walk with call bell in reach, encouragement and emotional support given.  Will con't plan of care.

## 2013-11-03 LAB — GLUCOSE, CAPILLARY
Glucose-Capillary: 125 mg/dL — ABNORMAL HIGH (ref 70–99)
Glucose-Capillary: 134 mg/dL — ABNORMAL HIGH (ref 70–99)

## 2013-11-03 LAB — CBC
HCT: 36.4 % (ref 36.0–46.0)
Hemoglobin: 12 g/dL (ref 12.0–15.0)
MCH: 30 pg (ref 26.0–34.0)
MCHC: 33 g/dL (ref 30.0–36.0)
MCV: 91 fL (ref 78.0–100.0)
Platelets: 421 10*3/uL — ABNORMAL HIGH (ref 150–400)
RBC: 4 MIL/uL (ref 3.87–5.11)
RDW: 13.8 % (ref 11.5–15.5)
WBC: 6.5 10*3/uL (ref 4.0–10.5)

## 2013-11-03 MED ORDER — OXYCODONE-ACETAMINOPHEN 5-325 MG PO TABS: 1.0000 | ORAL_TABLET | ORAL | Status: DC | PRN

## 2013-11-03 MED ORDER — TEMAZEPAM 30 MG PO CAPS: 30.0000 mg | ORAL_CAPSULE | Freq: Every evening | ORAL | Status: DC | PRN

## 2013-11-03 NOTE — Progress Notes (Signed)
*  Recently Discharged.  The Chaplain offered emotional and spiritual support today to the patient. The patient informed the Chaplain that she is ready to go home and continue her road to a full recovery and eventually resume going back to work. The patient said she was very pleased and excited to see the Chaplain before she was released from the hospital today.  Chaplain Bryson Ha Berry Godsey

## 2013-11-03 NOTE — Progress Notes (Signed)
Per CM, patient is going home. CSW reviewed chart and noticed plan is for patient to return home with home health. CSW is signing off at this time. Please re consult if social work needs arise.   Maree Krabbe, MSW, Theresia Majors [phone]

## 2013-11-03 NOTE — Progress Notes (Signed)
[facility]  Subjective  - Overall slowly improving   Objective 100/60 50 98.1 F (36.7 C) (Oral) 20 97%  Intake/Output Summary (Last 24 hours) at 11/03/13 9562 Last data filed at 11/03/13 0900  Gross per 24 hour  Intake    240 ml  Output   1600 ml  Net  -1360 ml   Tape blisters drying out, less edema, incisions clean and healing, doppler signals in foot  Assessment/Planning: Doing well slow to mobilize seems to be ready for home at this point D/c home with home health PT today  Sherren Kerns 11/03/2013 9:39 AM --  Laboratory Lab Results:  Recent Labs  11/03/13 0534  WBC 6.5  HGB 12.0  HCT 36.4  PLT 421*   BMET No results found for this basename: NA, K, CL, CO2, GLUCOSE, BUN, CREATININE, CALCIUM,  in the last 72 hours  COAG Lab Results  Component Value Date   INR 0.88 09/29/2013   INR 0.9 05/14/2008   No results found for this basename: PTT

## 2013-11-03 NOTE — Progress Notes (Signed)
DC tele and IV per protocol and MD orders; patient advised not to take amlodipine or norvasc if BP is less than 120/70-written on discharge instructions; prescriptions given to patient; no further questions at this time.  BARNETT, Geroge Baseman

## 2013-11-03 NOTE — Progress Notes (Signed)
BP 105/50, HR 50; notified MD; orders to hold atenolol, norvasc and avapro.  BARNETT, Geroge Baseman

## 2013-11-03 NOTE — Progress Notes (Signed)
Occupational Therapy Treatment Patient Details Name: Jill Higgins MRN: [id number] DOB: 1946-11-12 Today's Date: 11/03/2013 Time: 4098-1191 OT Time Calculation (min): 30 min  OT Assessment / Plan / Recommendation  History of present illness Adm for Lt fem-pop bypass after failed PTCA and stents. Post-op Lt thigh hematoma. Could only walk ~60 steps PTA due to pain. PMHx DM, HTN   OT comments  Pt seen for ADL retraining session. Pt currently Mod I LB dressing w/ supervision for sit to stand. Pt progressing toward all goals and plans to d/c home w/ friend later today. Cont to recommend HHOT & 3:1.   Follow Up Recommendations  Home health OT;Supervision - Intermittent    Barriers to Discharge       Equipment Recommendations  3 in 1 bedside comode    Recommendations for Other Services    Frequency Min 2X/week   Progress towards OT Goals Progress towards OT goals: Progressing toward goals  Plan Discharge plan remains appropriate    Precautions / Restrictions Precautions Precautions: Fall Restrictions Weight Bearing Restrictions: No   Pertinent Vitals/Pain 3/10 LLE leg pain. Increased activity, rest, repositioning.    ADL  Grooming: Performed;Wash/dry hands;Wash/dry face;Teeth care;Brushing hair;Applying makeup;Modified independent Where Assessed - Grooming: Unsupported standing;Supported standing Upper Body Bathing: Simulated;Modified independent Where Assessed - Upper Body Bathing: Unsupported sitting Upper Body Dressing: Performed;Modified independent Where Assessed - Upper Body Dressing: Unsupported sitting Lower Body Dressing: Performed;Modified independent Where Assessed - Lower Body Dressing: Supported sit to Pharmacist, hospital: Research scientist (life sciences) Method: Sit to Barista: Comfort height toilet;Grab bars Toileting - Architect and Hygiene: Performed;Supervision/safety Where Assessed - Medical sales representative and Hygiene: Sit to stand from 3-in-1 or toilet Tub/Shower Transfer Method: Not assessed Equipment Used: Rolling walker;Other (comment) (Pt amb w/ & w/o RW) Transfers/Ambulation Related to ADLs: Supervision ADL Comments: Pt sitting EOB for LB dressing Mod I, standing at sink for grooming and therapeutic activity x10 min at Mod I level. Discussed d/c home (pt to stay w/ friend beofre home alone). Pt reports thta she has been d/c and will have 4 wheeled walker. Rec 3:1 for use at home.    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 Post-op Lt thigh hematoma. Could only walk ~60 steps PTA due to pain. PMHx DM, HTN    Subjective Data      Prior Functioning       Cognition  Cognition Arousal/Alertness: Awake/alert Behavior During Therapy: WFL for tasks assessed/performed Overall Cognitive Status: Within Functional Limits for tasks assessed    Mobility  Bed Mobility Bed Mobility: Supine to Sit;Sitting - Scoot to Edge of Bed Supine to Sit: 6: Modified independent (Device/Increase time);With rails Sitting - Scoot to Edge of Bed: 6: Modified independent (Device/Increase time) Details for Bed Mobility Assistance: increased time Transfers Transfers: Sit to Stand;Stand to Sit Sit to Stand: 6: Modified independent (Device/Increase time);5: Supervision;From bed;From toilet;With upper extremity assist Stand to Sit: 5: Supervision;To bed;To chair/3-in-1;With upper extremity assist Details for  Transfer  Assistance: Cues for safest technique        Balance Balance Balance Assessed: No   End of Session OT - End of Session Equipment Utilized During Treatment: Rolling walker Activity Tolerance: Patient tolerated treatment well Patient left: in bed;with call bell/phone within reach (Sitting EOB) Nurse Communication: Mobility status  GO     Jill Higgins 11/03/2013, 11:25 AM

## 2013-11-04 DIAGNOSIS — IMO0001 Reserved for inherently not codable concepts without codable children: Secondary | ICD-10-CM | POA: Diagnosis not present

## 2013-11-04 DIAGNOSIS — E119 Type 2 diabetes mellitus without complications: Secondary | ICD-10-CM | POA: Diagnosis not present

## 2013-11-04 DIAGNOSIS — I10 Essential (primary) hypertension: Secondary | ICD-10-CM | POA: Diagnosis not present

## 2013-11-04 DIAGNOSIS — Z48812 Encounter for surgical aftercare following surgery on the circulatory system: Secondary | ICD-10-CM | POA: Diagnosis not present

## 2013-11-04 DIAGNOSIS — I739 Peripheral vascular disease, unspecified: Secondary | ICD-10-CM | POA: Diagnosis not present

## 2013-11-05 ENCOUNTER — Encounter: Payer: Self-pay | Admitting: Vascular Surgery

## 2013-11-06 ENCOUNTER — Encounter: Payer: Self-pay | Admitting: Vascular Surgery

## 2013-11-06 ENCOUNTER — Ambulatory Visit (INDEPENDENT_AMBULATORY_CARE_PROVIDER_SITE_OTHER): Payer: Self-pay | Admitting: Vascular Surgery

## 2013-11-06 VITALS — BP 130/70 | HR 60 | Temp 98.4°F | Ht 61.0 in | Wt 151.8 lb

## 2013-11-06 DIAGNOSIS — Z48812 Encounter for surgical aftercare following surgery on the circulatory system: Secondary | ICD-10-CM | POA: Diagnosis not present

## 2013-11-06 DIAGNOSIS — IMO0001 Reserved for inherently not codable concepts without codable children: Secondary | ICD-10-CM | POA: Diagnosis not present

## 2013-11-06 DIAGNOSIS — I10 Essential (primary) hypertension: Secondary | ICD-10-CM | POA: Diagnosis not present

## 2013-11-06 DIAGNOSIS — I70219 Atherosclerosis of native arteries of extremities with intermittent claudication, unspecified extremity: Secondary | ICD-10-CM

## 2013-11-06 DIAGNOSIS — I739 Peripheral vascular disease, unspecified: Secondary | ICD-10-CM | POA: Diagnosis not present

## 2013-11-06 DIAGNOSIS — E119 Type 2 diabetes mellitus without complications: Secondary | ICD-10-CM | POA: Diagnosis not present

## 2013-11-06 NOTE — Progress Notes (Signed)
Patient's 68 year old female who is status post left femoral to above-knee popliteal bypass with vein on December 2. She had a hematoma subsequently had this drained on December 9. She returns for followup today. She reports a swelling in her left leg is slowly improving. She is walking some. Her foot feels warm.  Physical exam:  Filed Vitals:   11/06/13 0916  BP: 130/70  Pulse: 60  Temp: 98.4 F (36.9 C)  TempSrc: Oral  Height: 5\' 1"  (1.549 m)  Weight: 151 lb 12.8 oz (68.856 kg)  SpO2: 100%    Left lower extremity: Take blisters dry and healing still some edema in the upper left thigh every other staple was removed today left foot pink warm Doppler signals  Assessment/plan: Slowly improving left lower extremity will return in a few weeks to have remained her staples removed  Fabienne Bruns, MD [facility] Office: [phone] Pager: [phone]

## 2013-11-07 ENCOUNTER — Encounter: Payer: Self-pay | Admitting: *Deleted

## 2013-11-10 DIAGNOSIS — I739 Peripheral vascular disease, unspecified: Secondary | ICD-10-CM | POA: Diagnosis not present

## 2013-11-10 DIAGNOSIS — Z48812 Encounter for surgical aftercare following surgery on the circulatory system: Secondary | ICD-10-CM | POA: Diagnosis not present

## 2013-11-10 DIAGNOSIS — I10 Essential (primary) hypertension: Secondary | ICD-10-CM | POA: Diagnosis not present

## 2013-11-10 DIAGNOSIS — IMO0001 Reserved for inherently not codable concepts without codable children: Secondary | ICD-10-CM | POA: Diagnosis not present

## 2013-11-10 DIAGNOSIS — E119 Type 2 diabetes mellitus without complications: Secondary | ICD-10-CM | POA: Diagnosis not present

## 2013-11-11 ENCOUNTER — Encounter: Payer: Self-pay | Admitting: Family

## 2013-11-11 DIAGNOSIS — I10 Essential (primary) hypertension: Secondary | ICD-10-CM | POA: Diagnosis not present

## 2013-11-11 DIAGNOSIS — IMO0001 Reserved for inherently not codable concepts without codable children: Secondary | ICD-10-CM | POA: Diagnosis not present

## 2013-11-11 DIAGNOSIS — E119 Type 2 diabetes mellitus without complications: Secondary | ICD-10-CM | POA: Diagnosis not present

## 2013-11-11 DIAGNOSIS — I739 Peripheral vascular disease, unspecified: Secondary | ICD-10-CM | POA: Diagnosis not present

## 2013-11-11 DIAGNOSIS — Z48812 Encounter for surgical aftercare following surgery on the circulatory system: Secondary | ICD-10-CM | POA: Diagnosis not present

## 2013-11-14 ENCOUNTER — Telehealth: Payer: Self-pay

## 2013-11-14 NOTE — Telephone Encounter (Signed)
Phone call from pt.  Requests refill on Oxycodone/ Acetaminophen.  Rates pain in her incision at "4-5/10".  States she has intermittent sharp pain in the left knee area.  Reports has swelling from foot to knee.  Denies redness.  Reports a "throbbing" at times in the left knee, and "it gets stiff at night while she has it elevated."  Reports the steri-strips have started to curl up and fall off.  Denies any drainage from left leg incision.  Denies any fever / chills.  Reports a small dry scab on an area of the incision.  Advised to continue to maintain mobility and slowly progress with activity of walking as tolerated.  Encouraged to continue to elevate the left leg, above level of heart, 3-4 times/ day to improve swelling.  Advised to try ES Tylenol or Ibuprofen OTC, for pain.  Encouraged to call office next week if pain not improving. Pt. Verb. Understanding.

## 2013-11-19 ENCOUNTER — Other Ambulatory Visit: Payer: Self-pay | Admitting: *Deleted

## 2013-11-19 ENCOUNTER — Other Ambulatory Visit: Payer: Self-pay | Admitting: Family

## 2013-11-19 ENCOUNTER — Encounter: Payer: Self-pay | Admitting: Family

## 2013-11-19 ENCOUNTER — Ambulatory Visit (INDEPENDENT_AMBULATORY_CARE_PROVIDER_SITE_OTHER): Payer: BC Managed Care – PPO | Admitting: Family

## 2013-11-19 ENCOUNTER — Ambulatory Visit (HOSPITAL_COMMUNITY)
Admission: RE | Admit: 2013-11-19 | Discharge: 2013-11-19 | Disposition: A | Discharge status: Home / Self Care | Payer: BC Managed Care – PPO | Source: Ambulatory Visit | Attending: Family | Admitting: Family

## 2013-11-19 VITALS — BP 146/78 | HR 60 | Temp 97.4°F | Resp 16 | Ht 63.0 in | Wt 151.0 lb

## 2013-11-19 DIAGNOSIS — R600 Localized edema: Secondary | ICD-10-CM

## 2013-11-19 DIAGNOSIS — M79609 Pain in unspecified limb: Secondary | ICD-10-CM

## 2013-11-19 DIAGNOSIS — I70219 Atherosclerosis of native arteries of extremities with intermittent claudication, unspecified extremity: Secondary | ICD-10-CM

## 2013-11-19 DIAGNOSIS — Z48812 Encounter for surgical aftercare following surgery on the circulatory system: Secondary | ICD-10-CM

## 2013-11-19 DIAGNOSIS — M7989 Other specified soft tissue disorders: Secondary | ICD-10-CM

## 2013-11-19 MED ORDER — OXYCODONE-ACETAMINOPHEN 5-325 MG PO TABS: 1.0000 | ORAL_TABLET | ORAL | Status: DC | PRN

## 2013-11-19 NOTE — Progress Notes (Signed)
[facility] HISTORY AND PHYSICAL -PAD  History of Present Illness Jill Higgins is a 67 y.o. female patient of Dr. Darrick Penna who is status post left femoral to above-knee popliteal bypass with non-reversed greater saphenous vein on December 2. She had a hematoma subsequently drained on December 9. Her staples were removed in two separate stages. She returns today for continued left upper and lower leg swelling and pain. She reports that she has been elevating her left leg, but apparently not above heart level.  She ran pout of Percocet a week ago, states the dose she was taking was adequate to control her pain.   Past Medical History  Diagnosis Date  . Tobacco abuse   . Hypertension   . Hyperlipidemia   . Peripheral vascular disease   . Fibromyalgia   . GERD (gastroesophageal reflux disease)   . Depression with anxiety   . Diabetes mellitus     Type II  . Bronchitis   . Bulging disc     in neck  . Pneumonia   . Overactive bladder     Social History History  Substance Use Topics  . Smoking status: Current Every Day Smoker    Types: Cigarettes  . Smokeless tobacco: Never Used     Comment: pt states that she is using the E cig only smokes 1-20 regular cigs per month  . Alcohol Use: 0 - .6 oz/week    0-1 Glasses of wine per week     Comment: Occasional    Family History Family History  Problem Relation Age of Onset  . Heart disease Father   . Heart attack Father     MI at age 87  . Hypertension Mother   . Alzheimer's disease Mother   . Hypertension Sister   . Diabetes Brother   . Hyperlipidemia Brother   . Hypertension Brother     Past Surgical History  Procedure Laterality Date  . Aortogram w/ pta  05/14/08,  11-04-10    Bilateral aortogram w/ bilateral  SFA PTA  stenting   . Hand surgery Left 2010  . Tonsillectomy    . Dilation and curettage of uterus      X4  . Bunionectomy      L foot in the 1980s  . Colonoscopy      2014   . Epidural shots in neck     . Femoral-popliteal bypass graft Left 10/21/2013    Procedure: LEFT FEMORAL-POPLITEAL ARTERY BYPASS WITH SAPHENOUS VEIN GRAFT , POPLITEAL ENDARTERECTOMY ,INTRAOPERATIVE ARTERIOGRAM, vein patch angioplasty to popliteal artery;  Surgeon: Sherren Kerns, MD;  Location: [facility] OR;  Service: Vascular;  Laterality: Left;  . Groin debridement Left 10/28/2013    Procedure: left inner thigh DEBRIDEMENT;  Surgeon: Sherren Kerns, MD;  Location: [facility] OR;  Service: Vascular;  Laterality: Left;    No Known Allergies  Current Outpatient Prescriptions  Medication Sig Dispense Refill  . amLODipine-olmesartan (AZOR) 5-40 MG per tablet Take 1 tablet by mouth daily.      Marland Kitchen aspirin EC 81 MG tablet Take 81 mg by mouth daily.      Marland Kitchen atenolol (TENORMIN) 50 MG tablet Take 50 mg by mouth daily.      . benzonatate (TESSALON) 200 MG capsule Take 200 mg by mouth daily.      Marland Kitchen Dextromethorphan-Guaifenesin 5-100 MG/5ML LIQD Take 5 mLs by mouth every 4 (four) hours as needed (congestion).      Marland Kitchen guaiFENesin (MUCINEX) 600  MG 12 hr tablet Take by mouth 2 (two) times daily.      Marland Kitchen oxyCODONE-acetaminophen (PERCOCET/ROXICET) 5-325 MG per tablet Take 1-2 tablets by mouth every 4 (four) hours as needed for moderate pain.  60 tablet  0  . PROAIR HFA 108 (90 BASE) MCG/ACT inhaler Inhale 2 puffs into the lungs every 4 (four) hours as needed for wheezing or shortness of breath.       . rosuvastatin (CRESTOR) 5 MG tablet Take 2.5 mg by mouth daily.      . sitaGLIPtan-metformin (JANUMET) 50-1000 MG per tablet Take 1 tablet by mouth 2 (two) times daily with a meal.      . temazepam (RESTORIL) 30 MG capsule Take 1 capsule (30 mg total) by mouth at bedtime as needed for sleep.  30 capsule  0  . TRAVATAN Z 0.004 % SOLN ophthalmic solution Place 1 drop into both eyes at bedtime.        No current facility-administered medications for this visit.    ROS: See HPI for pertinent positives and  negatives.   Physical Examination  Filed Vitals:   11/19/13 1323  BP: 146/78  Pulse: 60  Temp: 97.4 F (36.3 C)  Resp: 16   Filed Weights   11/19/13 1323  Weight: 151 lb (68.493 kg)   Body mass index is 26.76 kg/(m^2).  General: A&O x 3, WDWN Gait: limp Eyes: Pupils equal Pulmonary: CTAB, without wheezes , rales or rhonchi Cardiac: regular Rythm , without murmur          Carotid Bruits Left Right   Negative Negative  Aorta: is not palpable Radial pulses: 1+ and =                           VASCULAR EXAM: Extremities without ischemic changes except right foot is slightly dusky, without Gangrene; without open wounds. Left thigh and lower leg is swollen, left DP pulse is 2+ palpable. Left groin has moderate amount swelling, no drainage from puncture site which has not quite sealed over. Moderate amount of swelling at left groin. No redness or signs of cellulitis at left groin or left leg.                                                                                                          LE Pulses LEFT RIGHT       POPLITEAL   palpable    palpable       POSTERIOR TIBIAL  not palpable   not palpable        DORSALIS PEDIS      ANTERIOR TIBIAL 2+ palpable   palpable    Abdomen: soft, NT, no masses. Skin: no rashes, no ulcers noted. Musculoskeletal: no muscle wasting or atrophy.  Neurologic: A&O X 3; Appropriate Affect ; SENSATION: normal; MOTOR FUNCTION:  moving all extremities equally, motor strength 3/5 throughout. Speech is fluent/normal. CN 2-12 intact.    Non-Invasive Vascular Imaging: DATE: 11/19/2013 LLE Venous Duplex evaluation: Deep veins visualized with no evidence  of DVT.   ASSESSMENT: Jill Higgins is a 67 y.o. female who presents with: pain and swelling of LLE 29 days s/p left femoral to above-knee popliteal bypass with non-reversed greater saphenous vein. Venous Duplex today demonstrates no evidence of DVT. She was advised in detail by Dr.  Edilia Bo re elevation of her legs to decrease the LLE edema.   PLAN:  Dr. Edilia Bo examined and spoke with patient. Will obtain LLE venous imaging to evaluate for DVT; no DVT on venous Duplex. Elevate left knee above heart, elevate left ankle above knee, with slight flexion of knees. Renewed Percocet 5/325, 1-2 tabs po q4h prn pain, #60, no refills. She has an appointment scheduled with Dr. Darrick Penna next Thursday. I discussed in depth with the patient the nature of atherosclerosis, and emphasized the importance of maximal medical management including strict control of blood pressure, blood glucose, and lipid levels, obtaining regular exercise, and cessation of smoking.  The patient is aware that without maximal medical management the underlying atherosclerotic disease process will progress, limiting the benefit of any interventions.   The patient was given information about PAD including signs, symptoms, treatment, what symptoms should prompt the patient to seek immediate medical care, and risk reduction measures to take.  Charisse March, RN, MSN, FNP-C [facility] [place] Phone: [phone]  Clinic MD: Edilia Bo  11/19/2013 1:33 PM

## 2013-11-19 NOTE — Patient Instructions (Addendum)
Peripheral Vascular Disease Peripheral Vascular Disease (PVD), also called Peripheral Arterial Disease (PAD), is a circulation problem caused by cholesterol (atherosclerotic plaque) deposits in the arteries. PVD commonly occurs in the lower extremities (legs) but it can occur in other areas of the body, such as your arms. The cholesterol buildup in the arteries reduces blood flow which can cause pain and other serious problems. The presence of PVD can place a person at risk for Coronary Artery Disease (CAD).  CAUSES  Causes of PVD can be many. It is usually associated with more than one risk factor such as:   High Cholesterol.  Smoking.  Diabetes.  Lack of exercise or inactivity.  High blood pressure (hypertension).  Obesity.  Family history. SYMPTOMS   When the lower extremities are affected, patients with PVD may experience:  Leg pain with exertion or physical activity. This is called INTERMITTENT CLAUDICATION. This may present as cramping or numbness with physical activity. The location of the pain is associated with the level of blockage. For example, blockage at the abdominal level (distal abdominal aorta) may result in buttock or hip pain. Lower leg arterial blockage may result in calf pain.  As PVD becomes more severe, pain can develop with less physical activity.  In people with severe PVD, leg pain may occur at rest.  Other PVD signs and symptoms:  Leg numbness or weakness.  Coldness in the affected leg or foot, especially when compared to the other leg.  A change in leg color.  Patients with significant PVD are more prone to ulcers or sores on toes, feet or legs. These may take longer to heal or may reoccur. The ulcers or sores can become infected.  If signs and symptoms of PVD are ignored, gangrene may occur. This can result in the loss of toes or loss of an entire limb.  Not all leg pain is related to PVD. Other medical conditions can cause leg pain such  as:  Blood clots (embolism) or Deep Vein Thrombosis.  Inflammation of the blood vessels (vasculitis).  Spinal stenosis. DIAGNOSIS  Diagnosis of PVD can involve several different types of tests. These can include:  Pulse Volume Recording Method (PVR). This test is simple, painless and does not involve the use of X-rays. PVR involves measuring and comparing the blood pressure in the arms and legs. An ABI (Ankle-Brachial Index) is calculated. The normal ratio of blood pressures is 1. As this number becomes smaller, it indicates more severe disease.  < 0.95  indicates significant narrowing in one or more leg vessels.  <0.8 there will usually be pain in the foot, leg or buttock with exercise.  <0.4 will usually have pain in the legs at rest.  <0.25  usually indicates limb threatening PVD.  Doppler detection of pulses in the legs. This test is painless and checks to see if you have a pulses in your legs/feet.  A dye or contrast material (a substance that highlights the blood vessels so they show up on x-ray) may be given to help your caregiver better see the arteries for the following tests. The dye is eliminated from your body by the kidney's. Your caregiver may order blood work to check your kidney function and other laboratory values before the following tests are performed:  Magnetic Resonance Angiography (MRA). An MRA is a picture study of the blood vessels and arteries. The MRA machine uses a large magnet to produce images of the blood vessels.  Computed Tomography Angiography (CTA). A CTA is a   specialized x-ray that looks at how the blood flows in your blood vessels. An IV may be inserted into your arm so contrast dye can be injected.  Angiogram. Is a procedure that uses x-rays to look at your blood vessels. This procedure is minimally invasive, meaning a small incision (cut) is made in your groin. A small tube (catheter) is then inserted into the artery of your groin. The catheter is  guided to the blood vessel or artery your caregiver wants to examine. Contrast dye is injected into the catheter. X-rays are then taken of the blood vessel or artery. After the images are obtained, the catheter is taken out. TREATMENT  Treatment of PVD involves many interventions which may include:  Lifestyle changes:  Quitting smoking.  Exercise.  Following a low fat, low cholesterol diet.  Control of diabetes.  Foot care is very important to the PVD patient. Good foot care can help prevent infection.  Medication:  Cholesterol-lowering medicine.  Blood pressure medicine.  Anti-platelet drugs.  Certain medicines may reduce symptoms of Intermittent Claudication.  Interventional/Surgical options:  Angioplasty. An Angioplasty is a procedure that inflates a balloon in the blocked artery. This opens the blocked artery to improve blood flow.  Stent Implant. A wire mesh tube (stent) is placed in the artery. The stent expands and stays in place, allowing the artery to remain open.  Peripheral Bypass Surgery. This is a surgical procedure that reroutes the blood around a blocked artery to help improve blood flow. This type of procedure may be performed if Angioplasty or stent implants are not an option. SEEK IMMEDIATE MEDICAL CARE IF:   You develop pain or numbness in your arms or legs.  Your arm or leg turns cold, becomes blue in color.  You develop redness, warmth, swelling and pain in your arms or legs. MAKE SURE YOU:   Understand these instructions.  Will watch your condition.  Will get help right away if you are not doing well or get worse. Document Released: 12/14/2004 Document Revised: 01/29/2012 Document Reviewed: 11/10/2008 [facility] Patient Information 2014 [place], [place].  Femoral Popliteal Bypass A femoral popliteal bypass surgery is a surgery to go around (bypass) a blocked artery in the leg by using a blood vessel or a graft. Arteries are very important. They  carry oxygen and nutrients to the body. The femoral artery is in the upper part of the leg (thigh). It is the main artery that carries blood to the leg. Popliteal arteries are in the back of the knee. These arteries take blood to the lower part of the leg. LET YOUR CAREGIVER KNOW ABOUT:  Any allergies to medications, food or latex.  All medications you are taking, including:  All prescription medicines.  Over the counter medicines.  Topical skin creams.  Eye-drops, vitamins or herbs.  Previous problems with anesthesia or numbing medicine.  Problems with your heart, lungs or kidneys.  Any history of blood clots or bleeding problems.  Previous surgery.  Other health problems.  Possibility of pregnancy, if possible. RISKS AND COMPLICATIONS  Problems after surgery (complications) can occur. Possibilities include:  Bleeding.  Loss of pulses in your surgical leg due to a blood clot.  Blood clot in the graft site.  Failure of the graft.  Infection. BEFORE THE PROCEDURE  A medical history and physical exam will be done. Other tests may include:  Blood tests.  A test to check the heart's rhythm (electrocardiogram).  A test to look inside your arteries, to see how the  blood is flowing (angiogram). To do this, a dye is injected into your blood. Then X-rays are taken.  You may need to be admitted to the hospital the day before your surgery.  You should have nothing by mouth (NPO) starting at 12 midnight the day of your surgery. PROCEDURE The surgeon will bypass the blocked section of your artery. By bypassing the blocked area of the artery, blood flow is restored to the lower part of your leg. A bypass is done in one of two ways using either:  A blood vessel called the Saphenous vein.  A man made (synthetic) graft. AFTER THE PROCEDURE  You will stay in a recovery area until the anesthesia has worn off. Your blood pressure and pulse will be checked. After the recovery  room, you may be taken to the intensive care unit (ICU).  You will be given pain medicine to keep you comfortable.  You may have ice packs over the surgical area. This can help reduce swelling and pain.  Your surgical site will be checked often after your surgery. This is to make sure you have good blood flow in your leg.  Caregivers will feel for a heartbeat (pulse) on your foot. Your leg will also be checked for changes in color, temperature or for decreased sensation.  To prevent blood clots in your legs, you will begin activity a few hours after your surgery.  You may stay in the ICU for less than a day. From there, you will go to a regular hospital room.  When it is time for you to go home, you will be given discharge instructions. You will be shown how to care for your incisions. Ask when your incision site can get wet. Document Released: 09/03/2009 Document Revised: 01/29/2012 Document Reviewed: 09/03/2009 [facility] Patient Information 2014 [place], [place].

## 2013-11-21 DIAGNOSIS — I10 Essential (primary) hypertension: Secondary | ICD-10-CM | POA: Diagnosis not present

## 2013-11-21 DIAGNOSIS — Z48812 Encounter for surgical aftercare following surgery on the circulatory system: Secondary | ICD-10-CM | POA: Diagnosis not present

## 2013-11-21 DIAGNOSIS — I739 Peripheral vascular disease, unspecified: Secondary | ICD-10-CM | POA: Diagnosis not present

## 2013-11-21 DIAGNOSIS — E119 Type 2 diabetes mellitus without complications: Secondary | ICD-10-CM | POA: Diagnosis not present

## 2013-11-21 DIAGNOSIS — IMO0001 Reserved for inherently not codable concepts without codable children: Secondary | ICD-10-CM | POA: Diagnosis not present

## 2013-11-26 ENCOUNTER — Encounter: Payer: Self-pay | Admitting: Vascular Surgery

## 2013-11-27 ENCOUNTER — Ambulatory Visit (INDEPENDENT_AMBULATORY_CARE_PROVIDER_SITE_OTHER): Payer: BC Managed Care – PPO | Admitting: Vascular Surgery

## 2013-11-27 ENCOUNTER — Encounter: Payer: Self-pay | Admitting: Vascular Surgery

## 2013-11-27 VITALS — BP 116/61 | HR 57 | Ht 63.0 in | Wt 148.0 lb

## 2013-11-27 DIAGNOSIS — M79609 Pain in unspecified limb: Secondary | ICD-10-CM

## 2013-11-27 DIAGNOSIS — R609 Edema, unspecified: Secondary | ICD-10-CM

## 2013-11-27 DIAGNOSIS — R600 Localized edema: Secondary | ICD-10-CM

## 2013-11-27 DIAGNOSIS — I739 Peripheral vascular disease, unspecified: Secondary | ICD-10-CM

## 2013-11-27 MED ORDER — OXYCODONE-ACETAMINOPHEN 5-325 MG PO TABS: 2.0000 | ORAL_TABLET | Freq: Four times a day (QID) | ORAL | Status: DC | PRN

## 2013-11-27 NOTE — Progress Notes (Signed)
Patient is status post left femoral to above-knee popliteal bypass on December 2. She is now 5 weeks postop. She has persistent swelling in the left lower extremity. Her incisions are now healed but she still has some difficulty ambulating due to to swelling. She still has some pain behind the knee and the medial left inner thigh.  Recent duplex scan showed no evidence of DVT.  Physical exam:  Filed Vitals:   11/27/13 0912  BP: 116/61  Pulse: 57  Height: 5\' 3"  (1.6 m)  Weight: 148 lb (67.132 kg)  SpO2: 100%    Left lower extremity: Diffuse edema left groin to foot no focal collection all incisions healing well tape blisters all healed left foot pink warm well perfused no palpable pedal pulses  Assessment: Slowly improving left lower extremity patent bypass  Plan: Followup 2-3 weeks to reassess left lower extremity edema we'll place an Ace wrap today hopefully graduate to a compression stocking.  She will need a duplex exam and ABIs sometime late February or March  Ruta Hinds, MD [facility] Office: [phone] Pager: [phone]

## 2013-12-01 DIAGNOSIS — H409 Unspecified glaucoma: Secondary | ICD-10-CM | POA: Diagnosis not present

## 2013-12-01 DIAGNOSIS — H53439 Sector or arcuate defects, unspecified eye: Secondary | ICD-10-CM | POA: Diagnosis not present

## 2013-12-01 DIAGNOSIS — H4011X0 Primary open-angle glaucoma, stage unspecified: Secondary | ICD-10-CM | POA: Diagnosis not present

## 2013-12-05 ENCOUNTER — Telehealth: Payer: Self-pay | Admitting: *Deleted

## 2013-12-05 NOTE — Telephone Encounter (Signed)
Received phone message from [place] from [facility] [employer] (oral surgeon's office) requesting clearance for dental cleaning from MD and prescription for pre-medication for dental appt. If needed. Conferred with Dr. Bridgett Larsson about Jill Higgins's surgical history and dental office request.  Dr. Bridgett Larsson states Mrs. Helf is cleared for dental cleaning and states that no pre-medication is needed since no artificial graft was used in surgical procedure.  Reported Dr. Lianne Moris recommendation to [facility] who requested FAX documenting this information.  Fax sent to [phone].

## 2013-12-09 ENCOUNTER — Telehealth: Payer: Self-pay | Admitting: *Deleted

## 2013-12-09 NOTE — Telephone Encounter (Signed)
Jill Higgins states she experienced left leg swelling yesterday with no increase in activity level.  States left leg swelling has improved today.  Denies fevers/chills. Denies erythema. States she experiences "sharp pain in the left ankle and thigh several times a day and infrequent pain behind the left knee."  Denies left ankle or foot swelling.  Jill Higgins states she is elevating the left leg above the level of her heart as Dr. Oneida Alar instructed.  She states that she has been taking (since surgery date of 10-21-2013) Oxycodone 2 tablets every 4-6 hours as needed for pain.  She states that "the Oxycodone helped in the beginning but now they are not helping at all."  Will instruct scheduler to make appointment  for 12-11-2013 to see Nurse Practitioner on Dr. Nona Dell office day.

## 2013-12-10 ENCOUNTER — Encounter: Payer: Self-pay | Admitting: Family

## 2013-12-11 ENCOUNTER — Encounter: Payer: Self-pay | Admitting: Family

## 2013-12-11 ENCOUNTER — Ambulatory Visit (INDEPENDENT_AMBULATORY_CARE_PROVIDER_SITE_OTHER): Payer: Self-pay | Admitting: Family

## 2013-12-11 VITALS — BP 115/77 | HR 61 | Temp 97.6°F | Resp 16 | Ht 63.0 in | Wt 148.0 lb

## 2013-12-11 DIAGNOSIS — Z48812 Encounter for surgical aftercare following surgery on the circulatory system: Secondary | ICD-10-CM

## 2013-12-11 DIAGNOSIS — M79609 Pain in unspecified limb: Secondary | ICD-10-CM

## 2013-12-11 DIAGNOSIS — I739 Peripheral vascular disease, unspecified: Secondary | ICD-10-CM

## 2013-12-11 MED ORDER — OXYCODONE-ACETAMINOPHEN 5-325 MG PO TABS: 1.0000 | ORAL_TABLET | ORAL | Status: DC | PRN

## 2013-12-11 NOTE — Patient Instructions (Addendum)
Peripheral Vascular Disease Peripheral Vascular Disease (PVD), also called Peripheral Arterial Disease (PAD), is a circulation problem caused by cholesterol (atherosclerotic plaque) deposits in the arteries. PVD commonly occurs in the lower extremities (legs) but it can occur in other areas of the body, such as your arms. The cholesterol buildup in the arteries reduces blood flow which can cause pain and other serious problems. The presence of PVD can place a person at risk for Coronary Artery Disease (CAD).  CAUSES  Causes of PVD can be many. It is usually associated with more than one risk factor such as:   High Cholesterol.  Smoking.  Diabetes.  Lack of exercise or inactivity.  High blood pressure (hypertension).  Obesity.  Family history. SYMPTOMS   When the lower extremities are affected, patients with PVD may experience:  Leg pain with exertion or physical activity. This is called INTERMITTENT CLAUDICATION. This may present as cramping or numbness with physical activity. The location of the pain is associated with the level of blockage. For example, blockage at the abdominal level (distal abdominal aorta) may result in buttock or hip pain. Lower leg arterial blockage may result in calf pain.  As PVD becomes more severe, pain can develop with less physical activity.  In people with severe PVD, leg pain may occur at rest.  Other PVD signs and symptoms:  Leg numbness or weakness.  Coldness in the affected leg or foot, especially when compared to the other leg.  A change in leg color.  Patients with significant PVD are more prone to ulcers or sores on toes, feet or legs. These may take longer to heal or may reoccur. The ulcers or sores can become infected.  If signs and symptoms of PVD are ignored, gangrene may occur. This can result in the loss of toes or loss of an entire limb.  Not all leg pain is related to PVD. Other medical conditions can cause leg pain such  as:  Blood clots (embolism) or Deep Vein Thrombosis.  Inflammation of the blood vessels (vasculitis).  Spinal stenosis. DIAGNOSIS  Diagnosis of PVD can involve several different types of tests. These can include:  Pulse Volume Recording Method (PVR). This test is simple, painless and does not involve the use of X-rays. PVR involves measuring and comparing the blood pressure in the arms and legs. An ABI (Ankle-Brachial Index) is calculated. The normal ratio of blood pressures is 1. As this number becomes smaller, it indicates more severe disease.  < 0.95  indicates significant narrowing in one or more leg vessels.  <0.8 there will usually be pain in the foot, leg or buttock with exercise.  <0.4 will usually have pain in the legs at rest.  <0.25  usually indicates limb threatening PVD.  Doppler detection of pulses in the legs. This test is painless and checks to see if you have a pulses in your legs/feet.  A dye or contrast material (a substance that highlights the blood vessels so they show up on x-ray) may be given to help your caregiver better see the arteries for the following tests. The dye is eliminated from your body by the kidney's. Your caregiver may order blood work to check your kidney function and other laboratory values before the following tests are performed:  Magnetic Resonance Angiography (MRA). An MRA is a picture study of the blood vessels and arteries. The MRA machine uses a large magnet to produce images of the blood vessels.  Computed Tomography Angiography (CTA). A CTA is a   specialized x-ray that looks at how the blood flows in your blood vessels. An IV may be inserted into your arm so contrast dye can be injected.  Angiogram. Is a procedure that uses x-rays to look at your blood vessels. This procedure is minimally invasive, meaning a small incision (cut) is made in your groin. A small tube (catheter) is then inserted into the artery of your groin. The catheter is  guided to the blood vessel or artery your caregiver wants to examine. Contrast dye is injected into the catheter. X-rays are then taken of the blood vessel or artery. After the images are obtained, the catheter is taken out. TREATMENT  Treatment of PVD involves many interventions which may include:  Lifestyle changes:  Quitting smoking.  Exercise.  Following a low fat, low cholesterol diet.  Control of diabetes.  Foot care is very important to the PVD patient. Good foot care can help prevent infection.  Medication:  Cholesterol-lowering medicine.  Blood pressure medicine.  Anti-platelet drugs.  Certain medicines may reduce symptoms of Intermittent Claudication.  Interventional/Surgical options:  Angioplasty. An Angioplasty is a procedure that inflates a balloon in the blocked artery. This opens the blocked artery to improve blood flow.  Stent Implant. A wire mesh tube (stent) is placed in the artery. The stent expands and stays in place, allowing the artery to remain open.  Peripheral Bypass Surgery. This is a surgical procedure that reroutes the blood around a blocked artery to help improve blood flow. This type of procedure may be performed if Angioplasty or stent implants are not an option. SEEK IMMEDIATE MEDICAL CARE IF:   You develop pain or numbness in your arms or legs.  Your arm or leg turns cold, becomes blue in color.  You develop redness, warmth, swelling and pain in your arms or legs. MAKE SURE YOU:   Understand these instructions.  Will watch your condition.  Will get help right away if you are not doing well or get worse. Document Released: 12/14/2004 Document Revised: 01/29/2012 Document Reviewed: 11/10/2008 [employer] Patient Information 2014 [employer].  

## 2013-12-11 NOTE — Progress Notes (Addendum)
VASC[facility]IN SPECIALISTS OF  HISTORY AND PHYSICAL -PAD   History of Present Illness Jill Higgins is a 68 y.o. female patient of Dr. Darrick PennaFields who is status post left femoral to above-knee popliteal bypass with non-reversed greater saphenous vein on 10/21/13. She had a hematoma subsequently drained on December 9. Her staples were removed in two separate stages.  She returns today for continued left upper and lower leg swelling and pain. Pt states that pain is 4-8/10 in left leg. No DVT on venous Duplex last week.  Last week pt was advised to elevate left knee above heart, elevate left ankle above knee, with slight flexion of knees.  Last week renewed Percocet 5/325, 1-2 tabs po q4h prn pain, #60, no refills.  Has not taken any antibx post hospital. Denies fever or chills, denies drainage from the left leg incisions. Has been elevating her legs about 75% of the time, started wearing knee high compression hose yesterday. Walking decreases the pain in her left leg.  Pt Diabetic: Yes, states in good control, states her A1C was 5.5 Pt smoker: former smoker, quit Nov., 2014, using e-cigarettes now  Pt meds include: Statin :Yes Betablocker: Yes ASA: Yes Other anticoagulants/antiplatelets: no  Past Medical History  Diagnosis Date  . Tobacco abuse   . Hypertension   . Hyperlipidemia   . Peripheral vascular disease   . Fibromyalgia   . GERD (gastroesophageal reflux disease)   . Depression with anxiety   . Diabetes mellitus     Type II  . Bronchitis   . Bulging disc     in neck  . Pneumonia   . Overactive bladder     Social History History  Substance Use Topics  . Smoking status: Current Every Day Smoker    Types: Cigarettes  . Smokeless tobacco: Never Used     Comment: pt states that she is using the E cig only smokes 1-20 regular cigs per month  . Alcohol Use: 0 - .6 oz/week    0-1 Glasses of wine per week     Comment: Occasional    Family History Family  History  Problem Relation Age of Onset  . Heart disease Father   . Heart attack Father     MI at age 69  . Hypertension Mother   . Alzheimer's disease Mother   . Hypertension Sister   . Diabetes Brother   . Hyperlipidemia Brother   . Hypertension Brother     Past Surgical History  Procedure Laterality Date  . Aortogram w/ pta  05/14/08,  11-04-10    Bilateral aortogram w/ bilateral  SFA PTA  stenting   . Hand surgery Left 2010  . Tonsillectomy    . Dilation and curettage of uterus      X4  . Bunionectomy      L foot in the 1980s  . Colonoscopy      2014  . Epidural shots in neck     . Femoral-popliteal bypass graft Left 10/21/2013    Procedure: LEFT FEMORAL-POPLITEAL ARTERY BYPASS WITH SAPHENOUS VEIN GRAFT , POPLITEAL ENDARTERECTOMY ,INTRAOPERATIVE ARTERIOGRAM, vein patch angioplasty to popliteal artery;  Surgeon: Sherren Kernsharles E Fields, MD;  [facility];  Service: Vascular;  Laterality: Left;  . Groin debridement Left 10/28/2013    Procedure: left inner thigh DEBRIDEMENT;  Surgeon: Sherren Kernsharles E Fields, MD;  [facility];  Service: Vascular;  Laterality: Left;    No Known Allergies  Current Outpatient Prescriptions  Medication Sig Dispense Refill  .  amLODipine-olmesartan (AZOR) 5-40 MG per tablet Take 1 tablet by mouth daily.      Marland Kitchen aspirin EC 81 MG tablet Take 81 mg by mouth daily.      Marland Kitchen atenolol (TENORMIN) 50 MG tablet Take 50 mg by mouth daily.      . benzonatate (TESSALON) 200 MG capsule Take 200 mg by mouth daily.      Marland Kitchen Dextromethorphan-Guaifenesin 5-100 MG/5ML LIQD Take 5 mLs by mouth every 4 (four) hours as needed (congestion).      Marland Kitchen guaiFENesin (MUCINEX) 600 MG 12 hr tablet Take by mouth 2 (two) times daily.      Marland Kitchen oxyCODONE-acetaminophen (PERCOCET/ROXICET) 5-325 MG per tablet Take 1-2 tablets by mouth every 4 (four) hours as needed for moderate pain.  60 tablet  0  . oxyCODONE-acetaminophen (PERCOCET/ROXICET) 5-325 MG per tablet Take 2 tablets by mouth every 6 (six)  hours as needed.  60 tablet  0  . PROAIR HFA 108 (90 BASE) MCG/ACT inhaler Inhale 2 puffs into the lungs every 4 (four) hours as needed for wheezing or shortness of breath.       . rosuvastatin (CRESTOR) 5 MG tablet Take 2.5 mg by mouth daily.      . sitaGLIPtan-metformin (JANUMET) 50-1000 MG per tablet Take 1 tablet by mouth 2 (two) times daily with a meal.      . temazepam (RESTORIL) 30 MG capsule Take 1 capsule (30 mg total) by mouth at bedtime as needed for sleep.  30 capsule  0  . TRAVATAN Z 0.004 % SOLN ophthalmic solution Place 1 drop into both eyes at bedtime.        No current facility-administered medications for this visit.    ROS: See HPI for pertinent positives and negatives.   Physical Examination  Filed Vitals:   12/11/13 1033  BP: 115/77  Pulse: 61  Temp: 97.6 F (36.4 C)  Resp: 16   Filed Weights   12/11/13 1033  Weight: 148 lb (67.132 kg)   Body mass index is 26.22 kg/(m^2).  General: A&O x 3, WDWN,  Gait: limp Eyes: Pupils equal Pulmonary: CTAB, without wheezes , rales or rhonchi Cardiac: regular Rythm , without murmur         Carotid Bruits  Left  Right    Negative  Negative   Aorta: is not palpable  Radial pulses: 1+ and =                        VASCULAR EXAM: Extremities without ischemic changes  without Gangrene; without open wounds, but does have one suture small abscess. Left groin and all left leg incisions are healed with no evidence of infection, no evidence of cellulitis, no evidence of compartment syndrome.                                                                                                      LE Pulses  LEFT  RIGHT   POPLITEAL  palpable  palpable   POSTERIOR TIBIAL  not palpable  not palpable   DORSALIS  PEDIS  ANTERIOR TIBIAL  2+ palpable  palpable   Abdomen: soft, NT, no masses.  Skin: no rashes, no ulcers noted.  Musculoskeletal: no muscle wasting or atrophy.  Neurologic: A&O X 3; Appropriate Affect ; SENSATION:  normal; MOTOR FUNCTION: moving all extremities equally, motor strength 3/5 throughout. Speech is fluent/normal. CN 2-12 intact.  ASSESSMENT: Jill Higgins is a 68 y.o. female who presents with  Suture removed and small suture abscess expressed by Dr. Oneida Alar. Pt advised by Dr. Oneida Alar to walk 30 minutes daily.  PLAN:  Renewed Percocet 5/325, 1-2 tabs po q4h prn pain, #60, no refills. I discussed in depth with the patient the nature of atherosclerosis, and emphasized the importance of maximal medical management including strict control of blood pressure, blood glucose, and lipid levels, obtaining regular exercise, and cessation of smoking.  The patient is aware that without maximal medical management the underlying atherosclerotic disease process will progress, limiting the benefit of any interventions.  Based on the patient's examination, pt will return to clinic in 1 week as already scheduled with Dr. Oneida Alar.  The patient was given information about PAD including signs, symptoms, treatment, what symptoms should prompt the patient to seek immediate medical care, and risk reduction measures to take.  Clemon Chambers, RN, MSN, FNP-C [facility] [employer] Phone: [phone]  Clinic MD: Oneida Alar  12/11/2013 10:13 AM   Overall slowly healing. Swelling improved dramatically She needs to continue to mobilize and walk more. Left foot is pink warm well perfused with vaguely palpable dorsalis pedis pulse. Asmall stitch abscess was drained today from her mid thigh.  The patient has followup with me next week  Ruta Hinds, MD [facility] [place] Office: [phone] Pager: [phone]

## 2013-12-17 ENCOUNTER — Encounter: Payer: Self-pay | Admitting: Vascular Surgery

## 2013-12-18 ENCOUNTER — Encounter: Payer: Self-pay | Admitting: Vascular Surgery

## 2013-12-18 ENCOUNTER — Ambulatory Visit (INDEPENDENT_AMBULATORY_CARE_PROVIDER_SITE_OTHER): Payer: Self-pay | Admitting: Vascular Surgery

## 2013-12-18 VITALS — BP 120/70 | HR 72 | Ht 63.0 in | Wt 149.5 lb

## 2013-12-18 DIAGNOSIS — Z48812 Encounter for surgical aftercare following surgery on the circulatory system: Secondary | ICD-10-CM

## 2013-12-18 DIAGNOSIS — R609 Edema, unspecified: Secondary | ICD-10-CM

## 2013-12-18 DIAGNOSIS — M79609 Pain in unspecified limb: Secondary | ICD-10-CM

## 2013-12-18 DIAGNOSIS — R600 Localized edema: Secondary | ICD-10-CM | POA: Insufficient documentation

## 2013-12-18 NOTE — Progress Notes (Signed)
Patient returns for postoperative followup today. She underwent left femoral to above-knee popliteal bypass with vein on December 2. Her pain is slowly improving as well as her swelling. She has not been able to walk enough to see whether or not her claudication symptoms are improved at this point. She is trying to quit smoking.    Physical exam:  Filed Vitals:   12/18/13 0957  BP: 120/70  Pulse: 72  Height: 5\' 3"  (1.6 m)  Weight: 149 lb 8 oz (67.813 kg)  SpO2: 100%    Left lower extremity: Diffuse edema improved from last office visit, one plus left dorsalis pedis pulse, all incisions healed  Assessment: Slowly improving status post left femoropopliteal bypass Plan: Followup in early March for bilateral ABIs and lower extremity duplex exam hopefully return to work soon  Ruta Hinds, MD [facility] [place]: [phone] Pager: [phone]

## 2013-12-22 ENCOUNTER — Encounter: Payer: Self-pay | Admitting: Vascular Surgery

## 2013-12-23 ENCOUNTER — Other Ambulatory Visit: Payer: Self-pay | Admitting: Vascular Surgery

## 2013-12-29 ENCOUNTER — Telehealth: Payer: Self-pay | Admitting: *Deleted

## 2013-12-29 NOTE — Telephone Encounter (Signed)
Returning Jill Higgins's earlier phone call requesting pain medication refill.  [name] states she "forgot to ask Dr. Oneida Alar for refill on Oxycodone" on 12-18-2013 when she last saw him in the office.  She states that now she is having "some left leg swelling and left leg pain."  Explained to [name] that since she is  2 months post op she would need to be seen in the office by Dr. Oneida Alar or nurse practitioner to be reevaluated before additional pain medication could be prescribed.  [name] verbalized understanding.  Scheduler arranged office visit for [name] with nurse practitioner on 01-01-2014 at 11:20AM.  Mrs. Ostrosky notified of date and time for appt.

## 2013-12-31 ENCOUNTER — Encounter: Payer: Self-pay | Admitting: Family

## 2014-01-01 ENCOUNTER — Ambulatory Visit: Payer: BC Managed Care – PPO | Admitting: Family

## 2014-01-01 ENCOUNTER — Encounter: Payer: Self-pay | Admitting: *Deleted

## 2014-01-21 ENCOUNTER — Encounter: Payer: Self-pay | Admitting: Vascular Surgery

## 2014-01-22 ENCOUNTER — Ambulatory Visit (HOSPITAL_COMMUNITY)
Admission: RE | Admit: 2014-01-22 | Discharge: 2014-01-22 | Disposition: A | Discharge status: Home / Self Care | Payer: BC Managed Care – PPO | Source: Ambulatory Visit | Attending: Vascular Surgery | Admitting: Vascular Surgery

## 2014-01-22 ENCOUNTER — Ambulatory Visit (INDEPENDENT_AMBULATORY_CARE_PROVIDER_SITE_OTHER)
Admission: RE | Admit: 2014-01-22 | Discharge: 2014-01-22 | Disposition: A | Discharge status: Home / Self Care | Payer: BC Managed Care – PPO | Source: Ambulatory Visit | Attending: Vascular Surgery | Admitting: Vascular Surgery

## 2014-01-22 ENCOUNTER — Ambulatory Visit (INDEPENDENT_AMBULATORY_CARE_PROVIDER_SITE_OTHER): Payer: BC Managed Care – PPO | Admitting: Vascular Surgery

## 2014-01-22 ENCOUNTER — Encounter: Payer: Self-pay | Admitting: Vascular Surgery

## 2014-01-22 VITALS — BP 104/73 | HR 73 | Resp 16 | Ht 63.0 in | Wt 145.5 lb

## 2014-01-22 DIAGNOSIS — Z48812 Encounter for surgical aftercare following surgery on the circulatory system: Secondary | ICD-10-CM

## 2014-01-22 DIAGNOSIS — M79609 Pain in unspecified limb: Secondary | ICD-10-CM

## 2014-01-22 DIAGNOSIS — I739 Peripheral vascular disease, unspecified: Secondary | ICD-10-CM

## 2014-01-22 DIAGNOSIS — Z4889 Encounter for other specified surgical aftercare: Secondary | ICD-10-CM | POA: Insufficient documentation

## 2014-01-22 NOTE — Progress Notes (Signed)
Patient returns for postoperative followup today. She underwent left femoral to above-knee popliteal bypass with vein on December 2. Her pain is slowly improving as well as her swelling. She has no claudication symptoms are improved at this point. She is trying to quit smoking but is still using an electronic cigarette. Her leg swelling is significantly improved. She still has patchy areas of numbness and tingling along the incision.  Review of systems: Denies shortness of breath. Denies chest pain.  Physical exam:    Filed Vitals:   01/22/14 1547 01/22/14 1550  BP:  104/73  Pulse:  73  Resp: 16 16  Height: 5\' 3"  (1.6 m) 5\' 3"  (1.6 m)  Weight: 145 lb 8 oz (65.998 kg) 145 lb 8 oz (65.998 kg)    Left lower extremity: Diffuse edema improved from last office visit, one plus left dorsalis pedis pulse, all incisions healed  Data: Duplex ultrasound of the graft was performed today. The graft is patent. The native popliteal artery has some increased velocity of 242 cm/s. ABI today was 0.85 compared to 0.61 preoperatively  Assessment: Slowly improving status post left femoropopliteal bypass Plan: Followup 3 months bilateral ABIs and lower extremity duplex exam.  She'll return to work March 9.   Ruta Hinds, MD [facility] [place] Office: [phone] Pager: [phone]

## 2014-01-22 NOTE — Addendum Note (Signed)
Addended by: Mena Goes on: 01/22/2014 04:22 PM   Modules accepted: Orders

## 2014-04-22 ENCOUNTER — Encounter: Payer: Self-pay | Admitting: Family

## 2014-04-23 ENCOUNTER — Ambulatory Visit: Payer: BC Managed Care – PPO | Admitting: Family

## 2014-04-23 ENCOUNTER — Other Ambulatory Visit (HOSPITAL_COMMUNITY): Payer: BC Managed Care – PPO

## 2014-04-23 ENCOUNTER — Encounter (HOSPITAL_COMMUNITY): Payer: BC Managed Care – PPO

## 2014-05-20 ENCOUNTER — Encounter: Payer: Self-pay | Admitting: Vascular Surgery

## 2014-05-21 ENCOUNTER — Encounter: Payer: Self-pay | Admitting: Vascular Surgery

## 2014-05-21 ENCOUNTER — Ambulatory Visit (HOSPITAL_COMMUNITY)
Admission: RE | Admit: 2014-05-21 | Discharge: 2014-05-21 | Disposition: A | Discharge status: Home / Self Care | Payer: BC Managed Care – PPO | Source: Ambulatory Visit | Attending: Vascular Surgery | Admitting: Vascular Surgery

## 2014-05-21 ENCOUNTER — Ambulatory Visit (INDEPENDENT_AMBULATORY_CARE_PROVIDER_SITE_OTHER)
Admission: RE | Admit: 2014-05-21 | Discharge: 2014-05-21 | Disposition: A | Discharge status: Home / Self Care | Payer: BC Managed Care – PPO | Source: Ambulatory Visit | Attending: Vascular Surgery | Admitting: Vascular Surgery

## 2014-05-21 ENCOUNTER — Ambulatory Visit (INDEPENDENT_AMBULATORY_CARE_PROVIDER_SITE_OTHER): Payer: BC Managed Care – PPO | Admitting: Vascular Surgery

## 2014-05-21 VITALS — BP 135/66 | HR 73 | Ht 63.0 in | Wt 152.2 lb

## 2014-05-21 DIAGNOSIS — M79609 Pain in unspecified limb: Secondary | ICD-10-CM | POA: Insufficient documentation

## 2014-05-21 DIAGNOSIS — I739 Peripheral vascular disease, unspecified: Secondary | ICD-10-CM

## 2014-05-21 DIAGNOSIS — Z48812 Encounter for surgical aftercare following surgery on the circulatory system: Secondary | ICD-10-CM

## 2014-05-21 NOTE — Progress Notes (Addendum)
    Established Previous Bypass  History of Present Illness  Jill Higgins is a 68 y.o. (1946-05-25) female who presents for follow-up of her left femoral to above-knee popliteal bypass with vein on 10/21/13. Her previous ABI was 0.85 compared to 0.61 preoperatively.   The patient's PMH, PSH, SH, FamHx, Med, and Allergies are unchanged from 01/22/2014.   On ROS today: she denies any intermittent claudication or rest pain. All other systems negative.   Physical Examination  Filed Vitals:   05/21/14 1519  BP: 135/66  Pulse: 73  Height: 5\' 3"  (1.6 m)  Weight: 152 lb 3.2 oz (69.037 kg)  SpO2: 96%   Body mass index is 26.97 kg/(m^2).  Physical Examination  Filed Vitals:   05/21/14 1519  BP: 135/66  Pulse: 73  Height: 5\' 3"  (1.6 m)  Weight: 152 lb 3.2 oz (69.037 kg)  SpO2: 96%   Body mass index is 26.97 kg/(m^2).  General: A&O x 3, WDWN female in NAD  Eyes: pupils equal  Pulmonary: Non labored breathing  Vascular: Vessel Right Left  Femoral Palpable 2+ Palpable 2+  Popliteal Not palpable Not palpable  PT Not palpable Not palpable  DP Not alpable Palpable    Musculoskeletal: Extremities without ischemic changes.  Neurologic: Pain and light touch intact in extremities  Non-Invasive Vascular Imaging ABI (Date: 05/21/2014) R: 0.57, PT: 0.53 L: 0.79, PT: 0.72   Medical Decision Making  Jill LIGHTSEY is a 68 y.o. female who presents with: s/p left femoral to above-knee popliteal bypass with vein.  On today's duplex studies, she has monophasic flow and elevated velocities at the proximal anastomosis.  It was  previously triphasic at the appointment in March. Her previous ABI was 0.85 on the left. Today it is 0.79. Patient will follow-up with a left lower extremity arteriogram on 06/19/14.   Discussed possible balloon angioplasty intervention during procedure and possible need for future surgical intervention.   I discussed in depth with the patient the nature of  atherosclerosis, and emphasized the importance of maximal medical management including strict control of blood pressure, blood glucose, and lipid levels, obtaining regular exercise, and cessation of smoking.  The patient is aware that without maximal medical management the underlying atherosclerotic disease process will progress, limiting the benefit of any interventions.  I discussed in depth with the patient the need for long term surveillance to improve the primary assisted patency of his bypass.  The patient agrees to cooperate with such.  The patient is scheduled for the previous listed surveillance studies in 3 months.  Thank you for allowing [place] to participate in this patient's care.  Virgina Jock, PA-C [facility] [place] Office: [phone] Pager: [phone]  05/21/2014, 3:46 PM  This patient was seen in conjunction with Dr. Oneida Alar.    History exam details as above. Patient does have a palpable left dorsalis pedis pulse. However she has increasing proximal and distal velocities after anastomoses. I believe she would benefit from arteriogram for further evaluation. This is scheduled for July 31. Risks benefits possible complications and procedure details including but not limited to bleeding infection vessel injury contrast reaction were explained to the patient. We will also consider intervention with balloon angioplasty if warranted at time of her arteriogram  Ruta Hinds, MD [facility] [place]: [phone] Pager: [phone]

## 2014-05-25 ENCOUNTER — Other Ambulatory Visit: Payer: Self-pay

## 2014-06-12 ENCOUNTER — Encounter (HOSPITAL_COMMUNITY): Payer: Self-pay | Admitting: Pharmacy Technician

## 2014-06-18 MED ORDER — SODIUM CHLORIDE 0.9 % IV SOLN
INTRAVENOUS | Status: DC
  Administered 2014-06-19: 07:00:00 via INTRAVENOUS

## 2014-06-19 ENCOUNTER — Other Ambulatory Visit: Payer: Self-pay

## 2014-06-19 ENCOUNTER — Encounter (HOSPITAL_COMMUNITY)
Admission: RE | Disposition: A | Discharge status: Home / Self Care | Payer: Self-pay | Source: Ambulatory Visit | Attending: Vascular Surgery

## 2014-06-19 ENCOUNTER — Ambulatory Visit (HOSPITAL_COMMUNITY)
Admission: RE | Admit: 2014-06-19 | Discharge: 2014-06-19 | Disposition: A | Discharge status: Home / Self Care | Payer: BC Managed Care – PPO | Source: Ambulatory Visit | Attending: Vascular Surgery | Admitting: Vascular Surgery

## 2014-06-19 DIAGNOSIS — I70409 Unspecified atherosclerosis of autologous vein bypass graft(s) of the extremities, unspecified extremity: Secondary | ICD-10-CM | POA: Insufficient documentation

## 2014-06-19 DIAGNOSIS — F17200 Nicotine dependence, unspecified, uncomplicated: Secondary | ICD-10-CM | POA: Insufficient documentation

## 2014-06-19 DIAGNOSIS — I739 Peripheral vascular disease, unspecified: Secondary | ICD-10-CM

## 2014-06-19 DIAGNOSIS — I70219 Atherosclerosis of native arteries of extremities with intermittent claudication, unspecified extremity: Secondary | ICD-10-CM | POA: Insufficient documentation

## 2014-06-19 DIAGNOSIS — T82898A Other specified complication of vascular prosthetic devices, implants and grafts, initial encounter: Secondary | ICD-10-CM | POA: Diagnosis not present

## 2014-06-19 DIAGNOSIS — Z48812 Encounter for surgical aftercare following surgery on the circulatory system: Secondary | ICD-10-CM

## 2014-06-19 HISTORY — PX: ABDOMINAL AORTAGRAM: SHX5454

## 2014-06-19 LAB — GLUCOSE, CAPILLARY
Glucose-Capillary: 127 mg/dL — ABNORMAL HIGH (ref 70–99)
Glucose-Capillary: 126 mg/dL — ABNORMAL HIGH (ref 70–99)

## 2014-06-19 LAB — POCT I-STAT, CHEM 8
BUN: 15 mg/dL (ref 6–23)
Calcium, Ion: 1.19 mmol/L (ref 1.13–1.30)
Chloride: 104 mEq/L (ref 96–112)
Creatinine, Ser: 0.8 mg/dL (ref 0.50–1.10)
Glucose, Bld: 127 mg/dL — ABNORMAL HIGH (ref 70–99)
HCT: 37 % (ref 36.0–46.0)
Hemoglobin: 12.6 g/dL (ref 12.0–15.0)
Potassium: 3.9 mEq/L (ref 3.7–5.3)
Sodium: 141 mEq/L (ref 137–147)
TCO2: 26 mmol/L (ref 0–100)

## 2014-06-19 SURGERY — ABDOMINAL AORTAGRAM
Anesthesia: LOCAL

## 2014-06-19 MED ORDER — ACETAMINOPHEN 325 MG RE SUPP
325.0000 mg | RECTAL | Status: DC | PRN
  Filled 2014-06-19: qty 2

## 2014-06-19 MED ORDER — ACETAMINOPHEN 325 MG PO TABS
325.0000 mg | ORAL_TABLET | ORAL | Status: DC | PRN
  Filled 2014-06-19: qty 2

## 2014-06-19 MED ORDER — MORPHINE SULFATE 10 MG/ML IJ SOLN: 2.0000 mg | INTRAMUSCULAR | Status: DC | PRN

## 2014-06-19 MED ORDER — METOPROLOL TARTRATE 1 MG/ML IV SOLN: 2.0000 mg | INTRAVENOUS | Status: DC | PRN

## 2014-06-19 MED ORDER — ONDANSETRON HCL 4 MG/2ML IJ SOLN: 4.0000 mg | Freq: Four times a day (QID) | INTRAMUSCULAR | Status: DC | PRN

## 2014-06-19 MED ORDER — HEPARIN (PORCINE) IN NACL 2-0.9 UNIT/ML-% IJ SOLN
INTRAMUSCULAR | Status: AC
  Filled 2014-06-19: qty 1000

## 2014-06-19 MED ORDER — DOCUSATE SODIUM 100 MG PO CAPS
100.0000 mg | ORAL_CAPSULE | Freq: Every day | ORAL | Status: DC
Start: 2014-06-20 — End: 2014-06-19

## 2014-06-19 MED ORDER — SODIUM CHLORIDE 0.45 % IV SOLN
INTRAVENOUS | Status: DC
  Administered 2014-06-19: 09:00:00 via INTRAVENOUS

## 2014-06-19 MED ORDER — CEFUROXIME SODIUM 1.5 G IJ SOLR
1.5000 g | Freq: Two times a day (BID) | INTRAMUSCULAR | Status: DC
  Filled 2014-06-19 (×2): qty 1.5

## 2014-06-19 MED ORDER — HYDRALAZINE HCL 20 MG/ML IJ SOLN: 10.0000 mg | INTRAMUSCULAR | Status: DC | PRN

## 2014-06-19 MED ORDER — LABETALOL HCL 5 MG/ML IV SOLN: 10.0000 mg | INTRAVENOUS | Status: DC | PRN

## 2014-06-19 MED ORDER — OXYCODONE HCL 5 MG PO TABS: 5.0000 mg | ORAL_TABLET | ORAL | Status: DC | PRN

## 2014-06-19 MED ORDER — LIDOCAINE HCL (PF) 1 % IJ SOLN
INTRAMUSCULAR | Status: AC
  Filled 2014-06-19: qty 30

## 2014-06-19 NOTE — Op Note (Signed)
Procedure: Abdominal aortogram with bilateral lower extremity runoff  Preoperative diagnosis: Vein graft stenosis, claudication  Post Operative diagnosis: Same  Anesthesia: Local  Indications: Patient was noted to have slight decrease in her left leg ABI recently on routine surveillance. She was noted at increased velocities of 453 cm/s at the proximal anastomosis and 310 cm/s distal to the anastomosis.  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 The patient was placed in supine position on the Angio table. Both groins were prepped and draped in usual sterile fashion. Local anesthesia was infiltrated over the right common femoral artery. Ultrasound was used to identify the right common femoral artery. An introducer needle was used to cannulate the right common femoral artery. There was initial good flow of blood through the needle but the guidewire would not advance. The needle was removed and hemostasis obtained after 3 minutes of direct pressure. An additional attempt was made to cannulate the right common femoral artery successfully. An 93 versacore was then threaded up in the abdominal aorta under fluoroscopic guidance. A 5 French sheath was placed over the guidewire into the right common femoral artery. This was thoroughly flushed with heparinized saline. A 5 French pigtail catheter was placed over the guidewire into the abdominal aorta. Abdominal aortogram was obtained. Left and right renal arteries are patent. Infrarenal abdominal aorta is patent. The left and right common external and internal iliac arteries are patent.  Next a 5 French pigtail catheter was removed over a guidewire and a 5 [place] crossover catheter was placed over the  guidewire selectively catheterize the left common iliac artery. The 035 versacore was then threaded back through the catheter and down into the distal left external iliac artery. The cross her catheter was removed over guidewire and replaced with a 5 French straight catheter. A left lower extremity arteriogram was obtained through the catheter. An oblique and lateral view of the groin was obtained to proceed isolate the proximal anastomosis. There is a 50% narrowing of the left common femoral artery just proximal to the anastomosis. The proximal anastomosis is widely patent. The profunda femoris is patent. The superficial femoral artery is occluded. The remainder of the vein graft bypass is patent all the way to the distal anastomosis. The native popliteal artery below the anastomosis is narrowed approximately 50-70%. There is an aberrant takeoff of the anterior tibial artery above the knee joint. This is patent throughout it's course and is the dominant runoff vessel to the foot. This continues as the dorsalis pedis artery. The tibial peroneal trunk is patent. The posterior tibial artery is a large vessel but does not fill all the way to the foot. The peroneal artery is diminutive. I decided not to intervene in the native popliteal artery below the anastomosis for several reasons. This is a very small vessel caliber-wise. Additionally with the aberrant takeoff of the anterior tibial artery occlusion of the popliteal artery in this location would occlude her primary runoff vessel which is anterior tibial artery. Instead of claudication-type symptoms she would potentially be at risk of limb loss.  At this point a 5 [place] straight catheter was removed over guidewire. Right lower extremity runoff views were then obtained through the sheath in the right groin. The right common femoral artery is patent. The right profunda femoris  artery is patent. The right superficial femoral artery is occluded at its origin. The  right above-knee popliteal artery reconstitutes via collaterals in the mid thigh. The popliteal artery is patent. There is poor opacification of contrast distally but all 3 tibial vessels do fill.  The patient tolerated the procedure well and there were no complications. The patient was taken to the holding area in stable condition.  Operative management: Continued observation for now. The patient's current ABI is 0.8. She is currently not at risk of limb loss. Angioplasty of her popliteal artery below the anastomosis or revision to her anterior tibial artery may have limited durability and failure of this could potentially put her at limb loss risk. She currently has some mild claudication symptoms but is not at risk of losing a limb. I discussed with the patient walking as much as possible to develop collaterals as well as smoking cessation. She will followup with me in 6 months.  Ruta Hinds, MD [facility] [place] Office: [phone] Pager: [phone]

## 2014-06-19 NOTE — Discharge Instructions (Signed)
Angiogram, Care After ° °Refer to this sheet in the next few weeks. These instructions provide you with information on caring for yourself after your procedure. Your health care provider may also give you more specific instructions. Your treatment has been planned according to current medical practices, but problems sometimes occur. Call your health care provider if you have any problems or questions after your procedure.  °WHAT TO EXPECT AFTER THE PROCEDURE °After your procedure, it is typical to have the following sensations: °· Minor discomfort or tenderness and a small bump at the catheter insertion site. The bump should usually decrease in size and tenderness within 1 to 2 weeks. °· Any bruising will usually fade within 2 to 4 weeks. °HOME CARE INSTRUCTIONS  °· You may need to keep taking blood thinners if they were prescribed for you. Take medicines only as directed by your health care provider. °· Do not apply powder or lotion to the site. °· Do not take baths, swim, or use a hot tub until your health care provider approves. °· You may shower 24 hours after the procedure. Remove the bandage (dressing) and gently wash the site with plain soap and water. Gently pat the site dry. °· Inspect the site at least twice daily. °· Limit your activity for the first 24 hours. Do not bend, squat, or lift anything over 10 lb (9 kg) or as directed by your health care provider. °· Plan to have someone take you home after the procedure. Follow instructions about when you can drive or return to work. °SEEK MEDICAL CARE IF: °· You get light-headed when standing up. °· You have drainage (other than a small amount of blood on the dressing). °· You have chills. °· You have a fever. °· You have redness, warmth, swelling, or pain at the insertion site. °SEEK IMMEDIATE MEDICAL CARE IF:  °· You develop chest pain or shortness of breath, feel faint, or pass out. °· You have bleeding, swelling larger than a walnut, or drainage from the  catheter insertion site. °· You develop pain, discoloration, coldness, or severe bruising in the leg or arm that held the catheter. °· You have heavy bleeding from the site. If this happens, hold pressure on the site. °MAKE SURE YOU: °· Understand these instructions. °· Will watch your condition. °· Will get help right away if you are not doing well or get worse. °Document Released: 05/25/2005 Document Revised: 03/23/2014 Document Reviewed: 03/31/2013 °ExitCare® Patient Information ©2015 [employer]. This information is not intended to replace advice given to you by your health care provider. Make sure you discuss any questions you have with your health care provider. ° °

## 2014-06-19 NOTE — Progress Notes (Signed)
Site area: right groin// 5 fr sheath in the femoral artery  Site Prior to Removal:  Level 0 Pressure Applied For:20 minutes Manual:   yes Patient Status During Pull:  No complications  Post Pull Site:  Level 0 Post Pull Instructions Given:  yes Post Pull Pulses Present: DP +1 bilateral Dressing Applied: tegaderm  Bedrest begins @ 0915 Comments: pt pain free/ sipping fluids.

## 2014-06-19 NOTE — Interval H&P Note (Signed)
History and Physical Interval Note:  06/19/2014 7:47 AM  Jill Higgins  has presented today for surgery, with the diagnosis of pvd  The various methods of treatment have been discussed with the patient and family. After consideration of risks, benefits and other options for treatment, the patient has consented to  Procedure(s): ABDOMINAL AORTAGRAM (N/A) as a surgical intervention .  The patient's history has been reviewed, patient examined, no change in status, stable for surgery.  I have reviewed the patient's chart and labs.  Questions were answered to the patient's satisfaction.     [name] E

## 2014-06-19 NOTE — H&P (View-Only) (Signed)
    Established Previous Bypass  History of Present Illness  Jill Higgins is a 68 y.o. (Mar 06, 1946) female who presents for follow-up of her left femoral to above-knee popliteal bypass with vein on 10/21/13. Her previous ABI was 0.85 compared to 0.61 preoperatively.   The patient's PMH, PSH, SH, FamHx, Med, and Allergies are unchanged from 01/22/2014.   On ROS today: she denies any intermittent claudication or rest pain. All other systems negative.   Physical Examination  Filed Vitals:   05/21/14 1519  BP: 135/66  Pulse: 73  Height: 5\' 3"  (1.6 m)  Weight: 152 lb 3.2 oz (69.037 kg)  SpO2: 96%   Body mass index is 26.97 kg/(m^2).  Physical Examination  Filed Vitals:   05/21/14 1519  BP: 135/66  Pulse: 73  Height: 5\' 3"  (1.6 m)  Weight: 152 lb 3.2 oz (69.037 kg)  SpO2: 96%   Body mass index is 26.97 kg/(m^2).  General: A&O x 3, WDWN female in NAD  Eyes: pupils equal  Pulmonary: Non labored breathing  Vascular: Vessel Right Left  Femoral Palpable 2+ Palpable 2+  Popliteal Not palpable Not palpable  PT Not palpable Not palpable  DP Not alpable Palpable    Musculoskeletal: Extremities without ischemic changes.  Neurologic: Pain and light touch intact in extremities  Non-Invasive Vascular Imaging ABI (Date: 05/21/2014) R: 0.57, PT: 0.53 L: 0.79, PT: 0.72   Medical Decision Making  Jill Higgins is a 68 y.o. female who presents with: s/p left femoral to above-knee popliteal bypass with vein.  On today's duplex studies, she has monophasic flow and elevated velocities at the proximal anastomosis.  It was  previously triphasic at the appointment in March. Her previous ABI was 0.85 on the left. Today it is 0.79. Patient will follow-up with a left lower extremity arteriogram on 06/19/14.   Discussed possible balloon angioplasty intervention during procedure and possible need for future surgical intervention.   I discussed in depth with the patient the nature of  atherosclerosis, and emphasized the importance of maximal medical management including strict control of blood pressure, blood glucose, and lipid levels, obtaining regular exercise, and cessation of smoking.  The patient is aware that without maximal medical management the underlying atherosclerotic disease process will progress, limiting the benefit of any interventions.  I discussed in depth with the patient the need for long term surveillance to improve the primary assisted patency of his bypass.  The patient agrees to cooperate with such.  The patient is scheduled for the previous listed surveillance studies in 3 months.  Thank you for allowing [place] to participate in this patient's care.  Virgina Jock, PA-C [facility] Office: [phone] Pager: [phone]  05/21/2014, 3:46 PM  This patient was seen in conjunction with Dr. Oneida Alar.    History exam details as above. Patient does have a palpable left dorsalis pedis pulse. However she has increasing proximal and distal velocities after anastomoses. I believe she would benefit from arteriogram for further evaluation. This is scheduled for July 31. Risks benefits possible complications and procedure details including but not limited to bleeding infection vessel injury contrast reaction were explained to the patient. We will also consider intervention with balloon angioplasty if warranted at time of her arteriogram  Ruta Hinds, MD [facility] [place]: [phone] Pager: [phone]

## 2014-06-20 DIAGNOSIS — IMO0002 Reserved for concepts with insufficient information to code with codable children: Secondary | ICD-10-CM | POA: Diagnosis not present

## 2014-06-22 ENCOUNTER — Telehealth: Payer: Self-pay | Admitting: Vascular Surgery

## 2014-06-22 NOTE — Telephone Encounter (Addendum)
sched apppt for 12/24/14, u/s @ 3:30, f/u @ 4  Spoke to pt to inform them of appt  Mailed appt letter  Message copied by Georgiann Mccoy on Mon Jun 22, 2014  9:27 AM ------      Message from: Denman George      Created: Fri Jun 19, 2014 11:03 AM      Regarding: Jill Higgins; also needs f/u appt. in 6 mos with ABI's                    ----- Message -----         From: Elam Dutch, MD         Sent: 06/19/2014   8:54 AM           To: [facility] Charge Pool            Ultrasound groin      Aortogram with bilat runoff      2nd order cath right external iliac artery            She needs follow up appt and bilat ABI in 6 months            Charles ------

## 2014-09-04 ENCOUNTER — Other Ambulatory Visit: Payer: Self-pay

## 2014-09-17 DIAGNOSIS — Z23 Encounter for immunization: Secondary | ICD-10-CM | POA: Diagnosis not present

## 2014-10-19 DIAGNOSIS — N3000 Acute cystitis without hematuria: Secondary | ICD-10-CM | POA: Diagnosis not present

## 2014-10-19 DIAGNOSIS — R309 Painful micturition, unspecified: Secondary | ICD-10-CM | POA: Diagnosis not present

## 2014-10-29 ENCOUNTER — Encounter (HOSPITAL_COMMUNITY): Payer: Self-pay | Admitting: Vascular Surgery

## 2014-11-20 HISTORY — PX: EYE SURGERY: SHX253

## 2014-12-16 DIAGNOSIS — G4700 Insomnia, unspecified: Secondary | ICD-10-CM | POA: Diagnosis not present

## 2014-12-16 DIAGNOSIS — E780 Pure hypercholesterolemia: Secondary | ICD-10-CM | POA: Diagnosis not present

## 2014-12-16 DIAGNOSIS — E119 Type 2 diabetes mellitus without complications: Secondary | ICD-10-CM | POA: Diagnosis not present

## 2014-12-16 DIAGNOSIS — N61 Inflammatory disorders of breast: Secondary | ICD-10-CM | POA: Diagnosis not present

## 2014-12-16 DIAGNOSIS — I10 Essential (primary) hypertension: Secondary | ICD-10-CM | POA: Diagnosis not present

## 2014-12-17 ENCOUNTER — Other Ambulatory Visit: Payer: Self-pay | Admitting: General Surgery

## 2014-12-23 ENCOUNTER — Encounter: Payer: Self-pay | Admitting: Vascular Surgery

## 2014-12-24 ENCOUNTER — Ambulatory Visit (HOSPITAL_COMMUNITY)
Admission: RE | Admit: 2014-12-24 | Discharge: 2014-12-24 | Disposition: A | Discharge status: Home / Self Care | Payer: BLUE CROSS/BLUE SHIELD | Source: Ambulatory Visit | Attending: Vascular Surgery | Admitting: Vascular Surgery

## 2014-12-24 ENCOUNTER — Ambulatory Visit (INDEPENDENT_AMBULATORY_CARE_PROVIDER_SITE_OTHER): Payer: BLUE CROSS/BLUE SHIELD | Admitting: Vascular Surgery

## 2014-12-24 ENCOUNTER — Encounter: Payer: Self-pay | Admitting: Vascular Surgery

## 2014-12-24 VITALS — BP 148/77 | HR 62 | Temp 97.6°F | Resp 18 | Ht 63.0 in | Wt 153.0 lb

## 2014-12-24 DIAGNOSIS — Z48812 Encounter for surgical aftercare following surgery on the circulatory system: Secondary | ICD-10-CM | POA: Diagnosis not present

## 2014-12-24 DIAGNOSIS — I739 Peripheral vascular disease, unspecified: Secondary | ICD-10-CM

## 2014-12-24 NOTE — Progress Notes (Signed)
    Established Previous Bypass  History of Present Illness  Jill Higgins is a 69 y.o. (19-May-1946) female who presents for follow-up of her left femoral to above-knee popliteal bypass with vein on 10/21/13. She has no claudication symptoms in the left lower extremity. She does have claudication in the right lower extremity that occurs after walking several blocks. Unfortunately she continues to smoke. She was counseled against this today.  Physical Examination    Filed Vitals:   12/24/14 1629  BP: 148/77  Pulse: 62  Temp: 97.6 F (36.4 C)  TempSrc: Oral  Resp: 18  Height: 5\' 3"  (1.6 m)  Weight: 153 lb (69.4 kg)  SpO2: 100%    General: A&O x 3, WDWN female in NAD  Eyes: pupils equal  Pulmonary: Non labored breathing  Vascular:  One plus left dorsalis pedis pulse absent pedal pulses right foot 2+ femoral pulses bilaterally Musculoskeletal: Extremities without ischemic changes.  Neurologic: Pain and light touch intact in extremities  Non-Invasive Vascular Imaging Patient had bilateral ABIs performed today which I reviewed and interpreted. Right side was 0.67 left side 0.95  Assessment/Plan: Patient does have a palpable left dorsalis pedis pulse. Her graft is patent. She does complain of claudication symptoms in the right leg. However she wishes to defer any intervention in the right leg to left she retires October. She'll return for follow-up visit in 6 months time. At that time we'll repeat her ABIs.  Ruta Hinds, MD [facility] [place] Office: [phone] Pager: [phone]

## 2015-02-09 ENCOUNTER — Other Ambulatory Visit: Payer: Self-pay

## 2015-02-09 DIAGNOSIS — Z1231 Encounter for screening mammogram for malignant neoplasm of breast: Secondary | ICD-10-CM

## 2015-02-18 ENCOUNTER — Ambulatory Visit: Payer: BLUE CROSS/BLUE SHIELD

## 2015-02-19 ENCOUNTER — Ambulatory Visit
Admission: RE | Admit: 2015-02-19 | Discharge: 2015-02-19 | Disposition: A | Discharge status: Home / Self Care | Payer: BLUE CROSS/BLUE SHIELD | Source: Ambulatory Visit

## 2015-02-19 DIAGNOSIS — Z1231 Encounter for screening mammogram for malignant neoplasm of breast: Secondary | ICD-10-CM

## 2015-04-02 ENCOUNTER — Emergency Department (HOSPITAL_COMMUNITY)
Admission: EM | Admit: 2015-04-02 | Discharge: 2015-04-02 | Disposition: A | Discharge status: Home / Self Care | Payer: BLUE CROSS/BLUE SHIELD | Attending: Emergency Medicine | Admitting: Emergency Medicine

## 2015-04-02 ENCOUNTER — Emergency Department (HOSPITAL_COMMUNITY): Payer: BLUE CROSS/BLUE SHIELD

## 2015-04-02 ENCOUNTER — Encounter (HOSPITAL_COMMUNITY): Payer: Self-pay | Admitting: Emergency Medicine

## 2015-04-02 ENCOUNTER — Other Ambulatory Visit: Payer: Self-pay

## 2015-04-02 DIAGNOSIS — R110 Nausea: Secondary | ICD-10-CM | POA: Diagnosis not present

## 2015-04-02 DIAGNOSIS — Z8701 Personal history of pneumonia (recurrent): Secondary | ICD-10-CM | POA: Diagnosis not present

## 2015-04-02 DIAGNOSIS — Z8709 Personal history of other diseases of the respiratory system: Secondary | ICD-10-CM | POA: Insufficient documentation

## 2015-04-02 DIAGNOSIS — R079 Chest pain, unspecified: Secondary | ICD-10-CM | POA: Diagnosis not present

## 2015-04-02 DIAGNOSIS — F418 Other specified anxiety disorders: Secondary | ICD-10-CM | POA: Diagnosis not present

## 2015-04-02 DIAGNOSIS — R1084 Generalized abdominal pain: Secondary | ICD-10-CM | POA: Insufficient documentation

## 2015-04-02 DIAGNOSIS — Z7982 Long term (current) use of aspirin: Secondary | ICD-10-CM | POA: Insufficient documentation

## 2015-04-02 DIAGNOSIS — Z720 Tobacco use: Secondary | ICD-10-CM | POA: Insufficient documentation

## 2015-04-02 DIAGNOSIS — I10 Essential (primary) hypertension: Secondary | ICD-10-CM | POA: Insufficient documentation

## 2015-04-02 DIAGNOSIS — E119 Type 2 diabetes mellitus without complications: Secondary | ICD-10-CM | POA: Diagnosis not present

## 2015-04-02 DIAGNOSIS — K7689 Other specified diseases of liver: Secondary | ICD-10-CM | POA: Diagnosis not present

## 2015-04-02 DIAGNOSIS — M79632 Pain in left forearm: Secondary | ICD-10-CM | POA: Diagnosis not present

## 2015-04-02 DIAGNOSIS — Z87448 Personal history of other diseases of urinary system: Secondary | ICD-10-CM | POA: Insufficient documentation

## 2015-04-02 DIAGNOSIS — Z8719 Personal history of other diseases of the digestive system: Secondary | ICD-10-CM | POA: Diagnosis not present

## 2015-04-02 DIAGNOSIS — E785 Hyperlipidemia, unspecified: Secondary | ICD-10-CM | POA: Diagnosis not present

## 2015-04-02 DIAGNOSIS — R1013 Epigastric pain: Secondary | ICD-10-CM

## 2015-04-02 LAB — COMPREHENSIVE METABOLIC PANEL
ALT: 22 U/L (ref 14–54)
AST: 21 U/L (ref 15–41)
Albumin: 3.6 g/dL (ref 3.5–5.0)
Alkaline Phosphatase: 58 U/L (ref 38–126)
Anion gap: 10 (ref 5–15)
BUN: 11 mg/dL (ref 6–20)
CO2: 25 mmol/L (ref 22–32)
Calcium: 9.5 mg/dL (ref 8.9–10.3)
Chloride: 104 mmol/L (ref 101–111)
Creatinine, Ser: 1.18 mg/dL — ABNORMAL HIGH (ref 0.44–1.00)
GFR calc Af Amer: 53 mL/min — ABNORMAL LOW (ref >60)
GFR calc non Af Amer: 46 mL/min — ABNORMAL LOW (ref >60)
Glucose, Bld: 173 mg/dL — ABNORMAL HIGH (ref 65–99)
Potassium: 3.9 mmol/L (ref 3.5–5.1)
Sodium: 139 mmol/L (ref 135–145)
Total Bilirubin: 0.4 mg/dL (ref 0.3–1.2)
Total Protein: 6.8 g/dL (ref 6.5–8.1)

## 2015-04-02 LAB — CBC WITH DIFFERENTIAL/PLATELET
Basophils Absolute: 0 10*3/uL (ref 0.0–0.1)
Basophils Relative: 0 % (ref 0–1)
Eosinophils Absolute: 0.2 10*3/uL (ref 0.0–0.7)
Eosinophils Relative: 2 % (ref 0–5)
HCT: 35.9 % — ABNORMAL LOW (ref 36.0–46.0)
Hemoglobin: 12.2 g/dL (ref 12.0–15.0)
Lymphocytes Relative: 48 % — ABNORMAL HIGH (ref 12–46)
Lymphs Abs: 3.5 10*3/uL (ref 0.7–4.0)
MCH: 30.3 pg (ref 26.0–34.0)
MCHC: 34 g/dL (ref 30.0–36.0)
MCV: 89.3 fL (ref 78.0–100.0)
Monocytes Absolute: 0.4 10*3/uL (ref 0.1–1.0)
Monocytes Relative: 5 % (ref 3–12)
Neutro Abs: 3.3 10*3/uL (ref 1.7–7.7)
Neutrophils Relative %: 45 % (ref 43–77)
Platelets: 250 10*3/uL (ref 150–400)
RBC: 4.02 MIL/uL (ref 3.87–5.11)
RDW: 13.1 % (ref 11.5–15.5)
WBC: 7.4 10*3/uL (ref 4.0–10.5)

## 2015-04-02 LAB — LIPASE, BLOOD: Lipase: 87 U/L — ABNORMAL HIGH (ref 22–51)

## 2015-04-02 LAB — TROPONIN I: Troponin I: <0.03 ng/mL (ref <0.031)

## 2015-04-02 MED ORDER — SUCRALFATE 1 G PO TABS
1.0000 g | ORAL_TABLET | Freq: Once | ORAL | Status: AC
  Administered 2015-04-02: 1 g via ORAL
  Filled 2015-04-02: qty 1

## 2015-04-02 MED ORDER — HYDROCODONE-ACETAMINOPHEN 5-325 MG PO TABS: 1.0000 | ORAL_TABLET | ORAL | Status: DC | PRN

## 2015-04-02 MED ORDER — MORPHINE SULFATE 4 MG/ML IJ SOLN
4.0000 mg | Freq: Once | INTRAMUSCULAR | Status: AC
  Administered 2015-04-02: 4 mg via INTRAVENOUS
  Filled 2015-04-02: qty 1

## 2015-04-02 MED ORDER — GI COCKTAIL ~~LOC~~
30.0000 mL | Freq: Once | ORAL | Status: AC
  Administered 2015-04-02: 30 mL via ORAL
  Filled 2015-04-02: qty 30

## 2015-04-02 MED ORDER — SUCRALFATE 1 GM/10ML PO SUSP
1.0000 g | Freq: Three times a day (TID) | ORAL | Status: DC
Start: 2015-04-02 — End: 2015-11-16

## 2015-04-02 NOTE — ED Provider Notes (Signed)
CSN: [id number]     Arrival date & time 04/02/15  0436 History   First MD Initiated Contact with Patient 04/02/15 0441     Chief Complaint  Patient presents with  . Chest Pain  . Abdominal Pain  . Arm Pain     (Consider location/radiation/quality/duration/timing/severity/associated sxs/prior Treatment) HPI Patient has a history of GERD and presents with one week of epigastric pain that radiate up into her chest. Describes the pain as burning. This is worse after eating. She states she's been taking multiple medications over-the-counter without relief. The pain worsened this evening and woke her from sleep. She also complained of left forearm pain. She describes the pain as dull. Not worsened with movement of the arm. No known trauma. Denies vomiting. Past Medical History  Diagnosis Date  . Tobacco abuse   . Hypertension   . Hyperlipidemia   . Peripheral vascular disease   . Fibromyalgia   . GERD (gastroesophageal reflux disease)   . Depression with anxiety   . Diabetes mellitus     Type II  . Bronchitis   . Bulging disc     in neck  . Pneumonia   . Overactive bladder    Past Surgical History  Procedure Laterality Date  . Aortogram w/ pta  05/14/08,  11-04-10    Bilateral aortogram w/ bilateral  SFA PTA  stenting   . Hand surgery Left 2010  . Tonsillectomy    . Dilation and curettage of uterus      X4  . Bunionectomy      L foot in the 1980s  . Colonoscopy      2014  . Epidural shots in neck     . Femoral-popliteal bypass graft Left 10/21/2013    Procedure: LEFT FEMORAL-POPLITEAL ARTERY BYPASS WITH SAPHENOUS VEIN GRAFT , POPLITEAL ENDARTERECTOMY ,INTRAOPERATIVE ARTERIOGRAM, vein patch angioplasty to popliteal artery;  Surgeon: Elam Dutch, MD;  Location: [facility] OR;  Service: Vascular;  Laterality: Left;  . Groin debridement Left 10/28/2013    Procedure: left inner thigh DEBRIDEMENT;  Surgeon: Elam Dutch, MD;  Location: [facility];  Service: Vascular;  Laterality: Left;   . Abdominal aortagram N/A 08/15/2013    Procedure: ABDOMINAL Maxcine Ham;  Surgeon: Elam Dutch, MD;  Location: [facility] CATH LAB;  Service: Cardiovascular;  Laterality: N/A;  . Abdominal aortagram N/A 06/19/2014    Procedure: ABDOMINAL Maxcine Ham;  Surgeon: Elam Dutch, MD;  Location: [facility] CATH LAB;  Service: Cardiovascular;  Laterality: N/A;   Family History  Problem Relation Age of Onset  . Heart disease Father   . Heart attack Father     MI at age 30  . Hypertension Mother   . Alzheimer's disease Mother   . Hypertension Sister   . Diabetes Brother   . Hyperlipidemia Brother   . Hypertension Brother    History  Substance Use Topics  . Smoking status: Current Every Day Smoker -- 0.10 packs/day    Types: Cigarettes  . Smokeless tobacco: Never Used     Comment: pt states that she is using the E cig only smokes 1-20 regular cigs per month  . Alcohol Use: 0.0 - 0.6 oz/week    0-1 Glasses of wine per week     Comment: Occasional   OB History    No data available     Review of Systems  Constitutional: Negative for fever and chills.  Respiratory: Negative for shortness of breath.   Cardiovascular: Positive for chest pain. Negative for palpitations  and leg swelling.  Gastrointestinal: Positive for nausea and abdominal pain. Negative for vomiting and blood in stool.  Genitourinary: Negative for dysuria and flank pain.  Musculoskeletal: Negative for myalgias, back pain, neck pain and neck stiffness.  Skin: Negative for rash and wound.  Neurological: Negative for dizziness, weakness, light-headedness and numbness.  All other systems reviewed and are negative.     Allergies  Review of patient's allergies indicates no known allergies.  Home Medications   Prior to Admission medications   Medication Sig Start Date End Date Taking? Authorizing Provider  amLODipine-olmesartan (AZOR) 5-40 MG per tablet Take 1 tablet by mouth daily.    Historical Provider, MD  aspirin EC 81 MG  tablet Take 81 mg by mouth daily.    Historical Provider, MD  atenolol (TENORMIN) 50 MG tablet Take 50 mg by mouth daily.    Historical Provider, MD  naproxen sodium (ALEVE) 220 MG tablet Take 220 mg by mouth 2 (two) times daily as needed (for pain).    Historical Provider, MD  PROAIR HFA 108 (90 BASE) MCG/ACT inhaler Inhale 2 puffs into the lungs every 4 (four) hours as needed for wheezing or shortness of breath.  09/02/13   Historical Provider, MD  rosuvastatin (CRESTOR) 5 MG tablet Take 2.5 mg by mouth daily.    Historical Provider, MD  sitaGLIPtan-metformin (JANUMET) 50-1000 MG per tablet Take 1 tablet by mouth 2 (two) times daily with a meal.    Historical Provider, MD  temazepam (RESTORIL) 30 MG capsule Take 30 mg by mouth at bedtime.    Historical Provider, MD  TRAVATAN Z 0.004 % SOLN ophthalmic solution Place 1 drop into both eyes at bedtime.  01/28/13   Historical Provider, MD   There were no vitals taken for this visit. Physical Exam  Constitutional: She is oriented to person, place, and time. She appears well-developed and well-nourished. No distress.  HENT:  Head: Normocephalic and atraumatic.  Mouth/Throat: Oropharynx is clear and moist.  Eyes: EOM are normal. Pupils are equal, round, and reactive to light.  Neck: Normal range of motion. Neck supple.  Cardiovascular: Normal rate and regular rhythm.   Pulmonary/Chest: Effort normal and breath sounds normal. No respiratory distress. She has no wheezes. She has no rales. She exhibits no tenderness.  Abdominal: Soft. Bowel sounds are normal. She exhibits distension (mild diffuse abdominal distention). She exhibits no mass. There is tenderness (tender to palpation in the epigastrium.). There is no rebound and no guarding.  Musculoskeletal: Normal range of motion. She exhibits no edema or tenderness.  Neurological: She is alert and oriented to person, place, and time.  Skin: Skin is warm and dry. No rash noted. No erythema.  Psychiatric:  She has a normal mood and affect. Her behavior is normal.  Nursing note and vitals reviewed.   ED Course  Procedures (including critical care time) Labs Review Labs Reviewed  CBC WITH DIFFERENTIAL/PLATELET  COMPREHENSIVE METABOLIC PANEL  LIPASE, BLOOD  TROPONIN I    Imaging Review No results found.   EKG Interpretation   Date/Time:  Friday Apr 02 2015 10:38:46 EDT Ventricular Rate:  69 PR Interval:  162 QRS Duration: 86 QT Interval:  421 QTC Calculation: 451 R Axis:   28 Text Interpretation:  Sinus arrhythmia Baseline wander in lead(s) II ED  PHYSICIAN INTERPRETATION AVAILABLE IN [facility] Confirmed by TEST,  Record ([id number]) on 04/03/2015 6:50:22 AM      MDM   Final diagnoses:  Chest pain   Patient with atypical  chest and abdominal pain. Likely gastrointestinal in origin. Initial troponin and EKG without evidence of ischemia. Mild elevation in lipase. We'll get ultrasound of the gallbladder to rule out gallbladder disease. Signed out to oncoming emergency physician     Julianne Rice, MD 04/03/15 2317

## 2015-04-02 NOTE — ED Notes (Signed)
Pt states that she has been having pain in the epigastrum, radiating up to her chest, jaw and left arm, for longer than one week. Pt states that at times that she can feel the pain in her back. Pt reports mild diarrhea and nausea.

## 2015-04-02 NOTE — Discharge Instructions (Signed)

## 2015-04-02 NOTE — ED Provider Notes (Signed)
Patient was signed out to me by Dr. Lita Mains to follow-up on imaging. Patient had been seen for epigastric discomfort. She has a history of GERD. Her doctor placed her on Protonix earlier this week, but she has not had any improvement. Patient did improve with treatment for reflux here in the ER, however. Cardiac evaluation was negative. Ultrasound does not show any acute gallbladder disease. LFTs are normal. She did have a slightly elevated lipase. She has a slightly enlarged pancreatic duct. Significance is unclear, visualized pancreas was normal. Patient is feeling much better, can be discharged. I did discuss the case briefly with Dr. Paulita Fujita, will see the patient in the office for further evaluation.  Orpah Greek, MD 04/02/15 [phone]

## 2015-04-06 ENCOUNTER — Other Ambulatory Visit: Payer: Self-pay | Admitting: Gastroenterology

## 2015-04-06 DIAGNOSIS — R1013 Epigastric pain: Secondary | ICD-10-CM

## 2015-04-09 ENCOUNTER — Ambulatory Visit
Admission: RE | Admit: 2015-04-09 | Discharge: 2015-04-09 | Disposition: A | Discharge status: Home / Self Care | Payer: BLUE CROSS/BLUE SHIELD | Source: Ambulatory Visit | Attending: Gastroenterology | Admitting: Gastroenterology

## 2015-04-09 DIAGNOSIS — R1013 Epigastric pain: Secondary | ICD-10-CM

## 2015-04-09 MED ORDER — IOPAMIDOL (ISOVUE-300) INJECTION 61%
100.0000 mL | Freq: Once | INTRAVENOUS | Status: AC | PRN
  Administered 2015-04-09: 100 mL via INTRAVENOUS

## 2015-05-04 ENCOUNTER — Other Ambulatory Visit: Payer: Self-pay | Admitting: Gastroenterology

## 2015-05-17 ENCOUNTER — Other Ambulatory Visit: Payer: Self-pay

## 2015-06-08 ENCOUNTER — Encounter: Payer: Self-pay | Admitting: Cardiology

## 2015-07-01 ENCOUNTER — Other Ambulatory Visit: Payer: Self-pay | Admitting: *Deleted

## 2015-07-01 DIAGNOSIS — I739 Peripheral vascular disease, unspecified: Secondary | ICD-10-CM

## 2015-07-07 ENCOUNTER — Encounter: Payer: Self-pay | Admitting: Vascular Surgery

## 2015-07-08 ENCOUNTER — Ambulatory Visit (HOSPITAL_COMMUNITY)
Admission: RE | Admit: 2015-07-08 | Discharge: 2015-07-08 | Disposition: A | Discharge status: Home / Self Care | Payer: BLUE CROSS/BLUE SHIELD | Source: Ambulatory Visit | Attending: Vascular Surgery | Admitting: Vascular Surgery

## 2015-07-08 ENCOUNTER — Ambulatory Visit (INDEPENDENT_AMBULATORY_CARE_PROVIDER_SITE_OTHER): Payer: BLUE CROSS/BLUE SHIELD | Admitting: Vascular Surgery

## 2015-07-08 ENCOUNTER — Encounter: Payer: Self-pay | Admitting: Vascular Surgery

## 2015-07-08 VITALS — Ht 63.0 in | Wt 149.0 lb

## 2015-07-08 DIAGNOSIS — I739 Peripheral vascular disease, unspecified: Secondary | ICD-10-CM | POA: Diagnosis not present

## 2015-07-08 NOTE — Progress Notes (Signed)
    Established Previous Bypass  History of Present Illness  Jill Higgins is a 69 y.o. (1946-01-02) female who presents for follow-up of her left femoral to above-knee popliteal bypass with vein on 10/21/13. She has no claudication symptoms in the left lower extremity. She does have claudication in the right lower extremity that occurs after walking several blocks. Unfortunately she continues to smoke. She was counseled against this today. She is retiring in October.  Review of systems: She denies shortness of breath. She denies chest pain.  Physical Examination  Filed Vitals:   07/08/15 1604  Height: 5\' 3"  (1.6 m)  Weight: 149 lb (67.586 kg)      General: A&O x 3, WDWN female in NAD  Eyes: pupils equal  Pulmonary: Non labored breathing  Vascular:  One plus left dorsalis pedis pulse absent pedal pulses right foot 2+ femoral pulses bilaterally Musculoskeletal: Extremities without ischemic changes.  Neurologic: Pain and light touch intact in extremities  Non-Invasive Vascular Imaging Patient had bilateral ABIs performed today which I reviewed and interpreted right side was 0.7 left 1.06. Previous ABIs in February 2016 Right side was 0.67 left side 0.95  Assessment/Plan: Patient does have a palpable left dorsalis pedis pulse. Her graft is patent. She does complain of claudication symptoms in the right leg. However she wishes to defer any intervention in the right leg to left she retires October. She'll return for follow-up visit in December 2016. At that time she wishes to consider possible right femoropopliteal bypass.  Ruta Hinds, MD [facility] [place] Office: [phone] Pager: [phone]

## 2015-09-21 DIAGNOSIS — G4700 Insomnia, unspecified: Secondary | ICD-10-CM | POA: Diagnosis not present

## 2015-09-21 DIAGNOSIS — F17200 Nicotine dependence, unspecified, uncomplicated: Secondary | ICD-10-CM | POA: Diagnosis not present

## 2015-09-21 DIAGNOSIS — E7800 Pure hypercholesterolemia, unspecified: Secondary | ICD-10-CM | POA: Diagnosis not present

## 2015-09-21 DIAGNOSIS — F419 Anxiety disorder, unspecified: Secondary | ICD-10-CM | POA: Diagnosis not present

## 2015-09-21 DIAGNOSIS — I10 Essential (primary) hypertension: Secondary | ICD-10-CM | POA: Diagnosis not present

## 2015-09-21 DIAGNOSIS — E119 Type 2 diabetes mellitus without complications: Secondary | ICD-10-CM | POA: Diagnosis not present

## 2015-10-01 ENCOUNTER — Encounter (HOSPITAL_COMMUNITY): Payer: Self-pay

## 2015-10-01 ENCOUNTER — Emergency Department (HOSPITAL_COMMUNITY)
Admission: EM | Admit: 2015-10-01 | Discharge: 2015-10-02 | Disposition: A | Discharge status: Home / Self Care | Payer: Commercial Managed Care - HMO | Attending: Emergency Medicine | Admitting: Emergency Medicine

## 2015-10-01 DIAGNOSIS — Z7984 Long term (current) use of oral hypoglycemic drugs: Secondary | ICD-10-CM | POA: Diagnosis not present

## 2015-10-01 DIAGNOSIS — K219 Gastro-esophageal reflux disease without esophagitis: Secondary | ICD-10-CM | POA: Diagnosis not present

## 2015-10-01 DIAGNOSIS — E785 Hyperlipidemia, unspecified: Secondary | ICD-10-CM | POA: Diagnosis not present

## 2015-10-01 DIAGNOSIS — Z79899 Other long term (current) drug therapy: Secondary | ICD-10-CM | POA: Insufficient documentation

## 2015-10-01 DIAGNOSIS — Z8742 Personal history of other diseases of the female genital tract: Secondary | ICD-10-CM | POA: Insufficient documentation

## 2015-10-01 DIAGNOSIS — Z7982 Long term (current) use of aspirin: Secondary | ICD-10-CM | POA: Insufficient documentation

## 2015-10-01 DIAGNOSIS — Z720 Tobacco use: Secondary | ICD-10-CM | POA: Diagnosis not present

## 2015-10-01 DIAGNOSIS — E119 Type 2 diabetes mellitus without complications: Secondary | ICD-10-CM | POA: Insufficient documentation

## 2015-10-01 DIAGNOSIS — Z8739 Personal history of other diseases of the musculoskeletal system and connective tissue: Secondary | ICD-10-CM | POA: Diagnosis not present

## 2015-10-01 DIAGNOSIS — I10 Essential (primary) hypertension: Secondary | ICD-10-CM | POA: Diagnosis not present

## 2015-10-01 DIAGNOSIS — Z8709 Personal history of other diseases of the respiratory system: Secondary | ICD-10-CM | POA: Insufficient documentation

## 2015-10-01 DIAGNOSIS — Z8659 Personal history of other mental and behavioral disorders: Secondary | ICD-10-CM | POA: Diagnosis not present

## 2015-10-01 DIAGNOSIS — R1013 Epigastric pain: Secondary | ICD-10-CM

## 2015-10-01 DIAGNOSIS — Z8701 Personal history of pneumonia (recurrent): Secondary | ICD-10-CM | POA: Insufficient documentation

## 2015-10-01 DIAGNOSIS — I739 Peripheral vascular disease, unspecified: Secondary | ICD-10-CM | POA: Insufficient documentation

## 2015-10-01 LAB — CBC
HCT: 37.7 % (ref 36.0–46.0)
Hemoglobin: 12.7 g/dL (ref 12.0–15.0)
MCH: 30 pg (ref 26.0–34.0)
MCHC: 33.7 g/dL (ref 30.0–36.0)
MCV: 89.1 fL (ref 78.0–100.0)
Platelets: 298 10*3/uL (ref 150–400)
RBC: 4.23 MIL/uL (ref 3.87–5.11)
RDW: 13.1 % (ref 11.5–15.5)
WBC: 6.6 10*3/uL (ref 4.0–10.5)

## 2015-10-01 LAB — BASIC METABOLIC PANEL
Anion gap: 8 (ref 5–15)
BUN: 14 mg/dL (ref 6–20)
CO2: 26 mmol/L (ref 22–32)
Calcium: 9.6 mg/dL (ref 8.9–10.3)
Chloride: 105 mmol/L (ref 101–111)
Creatinine, Ser: 0.89 mg/dL (ref 0.44–1.00)
GFR calc Af Amer: >60 mL/min (ref >60)
GFR calc non Af Amer: >60 mL/min (ref >60)
Glucose, Bld: 162 mg/dL — ABNORMAL HIGH (ref 65–99)
Potassium: 3.8 mmol/L (ref 3.5–5.1)
Sodium: 139 mmol/L (ref 135–145)

## 2015-10-01 LAB — I-STAT TROPONIN, ED: Troponin i, poc: 0 ng/mL (ref 0.00–0.08)

## 2015-10-01 MED ORDER — PANTOPRAZOLE SODIUM 40 MG PO TBEC
40.0000 mg | DELAYED_RELEASE_TABLET | Freq: Once | ORAL | Status: AC
Start: 2015-10-01 — End: 2015-10-01
  Administered 2015-10-01: 40 mg via ORAL
  Filled 2015-10-01: qty 1

## 2015-10-01 MED ORDER — FAMOTIDINE 20 MG PO TABS
40.0000 mg | ORAL_TABLET | Freq: Once | ORAL | Status: AC
  Administered 2015-10-01: 40 mg via ORAL
  Filled 2015-10-01: qty 2

## 2015-10-01 MED ORDER — GI COCKTAIL ~~LOC~~
30.0000 mL | Freq: Once | ORAL | Status: AC
  Administered 2015-10-02: 30 mL via ORAL
  Filled 2015-10-01: qty 30

## 2015-10-01 NOTE — ED Provider Notes (Signed)
,   Complains of of epigastric pain radiating to anterior chest onset 1 week ago, constant, worse with lying supine, or eating improved with sitting up. Pain is nonexertional feels like GE reflux that she suffered from in the past. She's treated herself with her usual medications come without adequate pain relief. No other associated symptoms On exam no distress lungs clear auscultation heart regular rate and rhythm abdomen nondistended nontender  Orlie Dakin, MD 10/02/15 0113

## 2015-10-01 NOTE — ED Provider Notes (Signed)
CSN: [id number]     Arrival date & time 10/01/15  1746 History   First MD Initiated Contact with Patient 10/01/15 2249     Chief Complaint  Patient presents with  . Gastroesophageal Reflux     (Consider location/radiation/quality/duration/timing/severity/associated sxs/prior Treatment) HPI   Jill Higgins is a 69 y.o. female, with a history of GERD, Fibromyalgia, and DM, presenting to the ED with epigastric pain for the past week. Pt states that it feels like her acid reflux. Pt has been taking carafate and protonix at home with no relief. Pt states that this happens every once in a while where her medications stop working and have to be changed. Pain is burning, 10/10, and sometimes moves through to her back. Pt denies N/V/C/D, fever/chills, chest pain, shortness of breath, or any other complaints. Pt is already on triple therapy for her GERD.  Pt had an endoscopy two months ago. Pain is worse with eating and when pt is lying down, but ginger ale and sitting up both help. Pt is not affected by exertion. Pt has also tried pepto bismol and prilosec with no relief. Pt contacted her GI specialist, Dr. Penny Pia, who made her an appt for Dec 5.     Past Medical History  Diagnosis Date  . Tobacco abuse   . Hypertension   . Hyperlipidemia   . Peripheral vascular disease ([place])   . Fibromyalgia   . GERD (gastroesophageal reflux disease)   . Depression with anxiety   . Diabetes mellitus     Type II  . Bronchitis   . Bulging disc     in neck  . Pneumonia   . Overactive bladder    Past Surgical History  Procedure Laterality Date  . Aortogram w/ pta  05/14/08,  11-04-10    Bilateral aortogram w/ bilateral  SFA PTA  stenting   . Hand surgery Left 2010  . Tonsillectomy    . Dilation and curettage of uterus      X4  . Bunionectomy      L foot in the 1980s  . Colonoscopy      2014  . Epidural shots in neck     . Femoral-popliteal bypass graft Left 10/21/2013    Procedure: LEFT  FEMORAL-POPLITEAL ARTERY BYPASS WITH SAPHENOUS VEIN GRAFT , POPLITEAL ENDARTERECTOMY ,INTRAOPERATIVE ARTERIOGRAM, vein patch angioplasty to popliteal artery;  Surgeon: Elam Dutch, MD;  [facility]: [facility] OR;  Service: Vascular;  Laterality: Left;  . Groin debridement Left 10/28/2013    Procedure: left inner thigh DEBRIDEMENT;  Surgeon: Elam Dutch, MD;  Location: [place];  Service: Vascular;  Laterality: Left;  . Abdominal aortagram N/A 08/15/2013    Procedure: ABDOMINAL Maxcine Ham;  Surgeon: Elam Dutch, MD;  Location: [facility] CATH LAB;  Service: Cardiovascular;  Laterality: N/A;  . Abdominal aortagram N/A 06/19/2014    Procedure: ABDOMINAL Maxcine Ham;  Surgeon: Elam Dutch, MD;  Location: [facility] CATH LAB;  Service: Cardiovascular;  Laterality: N/A;   Family History  Problem Relation Age of Onset  . Heart disease Father   . Heart attack Father     MI at age 9  . Hypertension Mother   . Alzheimer's disease Mother   . Hypertension Sister   . Diabetes Brother   . Hyperlipidemia Brother   . Hypertension Brother   . Heart disease Brother   . Heart attack Brother    Social History  Substance Use Topics  . Smoking status: Current Every Day Smoker -- 0.10  packs/day    Types: Cigarettes  . Smokeless tobacco: Never Used     Comment: pt states that she is using the E cig only smokes 1-20 regular cigs per month  . Alcohol Use: 0.0 - 0.6 oz/week    0-1 Glasses of wine per week     Comment: Occasional   OB History    No data available     Review of Systems  Constitutional: Negative for fever and chills.  Gastrointestinal: Positive for abdominal pain. Negative for nausea, vomiting, diarrhea and constipation.  All other systems reviewed and are negative.     Allergies  Review of patient's allergies indicates no known allergies.  Home Medications   Prior to Admission medications   Medication Sig Start Date End Date Taking? Authorizing Provider  amLODipine-olmesartan (AZOR) 5-40 MG  per tablet Take 1 tablet by mouth daily.   Yes Historical Provider, MD  aspirin EC 81 MG tablet Take 81 mg by mouth daily.   Yes Historical Provider, MD  atenolol (TENORMIN) 50 MG tablet Take 50 mg by mouth daily.   Yes Historical Provider, MD  bismuth subsalicylate (PEPTO BISMOL) 262 MG/15ML suspension Take 30 mLs by mouth every 6 (six) hours as needed for indigestion or diarrhea or loose stools.   Yes Historical Provider, MD  NON FORMULARY Take 2 tablets by mouth daily. Hair Infinity   Yes Historical Provider, MD  rosuvastatin (CRESTOR) 5 MG tablet Take 2.5 mg by mouth daily.   Yes Historical Provider, MD  sitaGLIPtan-metformin (JANUMET) 50-1000 MG per tablet Take 1 tablet by mouth 2 (two) times daily with a meal.   Yes Historical Provider, MD  sucralfate (CARAFATE) 1 GM/10ML suspension Take 10 mLs (1 g total) by mouth 4 (four) times daily -  with meals and at bedtime. 04/02/15  Yes Orpah Greek, MD  temazepam (RESTORIL) 30 MG capsule Take 30 mg by mouth at bedtime.   Yes Historical Provider, MD  TRAVATAN Z 0.004 % SOLN ophthalmic solution Place 1 drop into both eyes at bedtime.  01/28/13  Yes Historical Provider, MD  HYDROcodone-acetaminophen (NORCO/VICODIN) 5-325 MG per tablet Take 1 tablet by mouth every 4 (four) hours as needed for moderate pain. 04/02/15   Orpah Greek, MD  naproxen sodium (ALEVE) 220 MG tablet Take 220 mg by mouth 2 (two) times daily as needed (for pain).    Historical Provider, MD  pantoprazole (PROTONIX) 20 MG tablet Take 2 tablets (40 mg total) by mouth 2 (two) times daily. 10/02/15   Caelan Branden C Maisie Hauser, PA-C  PROAIR HFA 108 (90 BASE) MCG/ACT inhaler Inhale 2 puffs into the lungs every 4 (four) hours as needed for wheezing or shortness of breath.  09/02/13   Historical Provider, MD   BP 146/76 mmHg  Pulse 55  Temp(Src) 98.2 F (36.8 C) (Oral)  Resp 14  SpO2 99% Physical Exam  Constitutional: She appears well-developed and well-nourished. No distress.  HENT:   Head: Normocephalic and atraumatic.  Eyes: Conjunctivae are normal. Pupils are equal, round, and reactive to light.  Cardiovascular: Normal rate, regular rhythm and normal heart sounds.   Pulmonary/Chest: Effort normal and breath sounds normal. No respiratory distress.  Abdominal: Soft. Normal appearance and bowel sounds are normal. There is tenderness in the epigastric area.  Musculoskeletal: She exhibits no edema or tenderness.  Neurological: She is alert.  Skin: Skin is warm and dry. She is not diaphoretic.  Nursing note and vitals reviewed.   ED Course  Procedures (including critical care  time) Labs Review Labs Reviewed  BASIC METABOLIC PANEL - Abnormal; Notable for the following:    Glucose, Bld 162 (*)    All other components within normal limits  LIPASE, BLOOD - Abnormal; Notable for the following:    Lipase 94 (*)    All other components within normal limits  CBC  I-STAT TROPOININ, ED    Imaging Review No results found. I have personally reviewed and evaluated these images and lab results as part of my medical decision-making.   EKG Interpretation   Date/Time:  Saturday October 02 2015 00:00:02 EST Ventricular Rate:  56 PR Interval:  169 QRS Duration: 89 QT Interval:  413 QTC Calculation: 398 R Axis:   41 Text Interpretation:  Sinus rhythm Low voltage, precordial leads Abnormal  R-wave progression, early transition Nonspecific T abnormalities, lateral  leads No significant change since last tracing Confirmed by Winfred Leeds   MD, SAM [phone]) on 10/02/2015 12:04:58 AM      MDM   Final diagnoses:  Gastroesophageal reflux disease, esophagitis presence not specified  Epigastric pain    Jarome Lamas presents with epigastric pain for 1 week.  Findings and plan of care discussed with Orlie Dakin, MD.  Suspect acid reflux  exacerbation. Although patient is on triple therapy she is only been prescribed her Protonix to be taken once daily. The next step  in therapy is to increase this to twice daily, however this should be done by patient's GI physician or PCP. Here in the ED patient will be given a second dose of Protonix as well as Pepcid. When asked about her high glucose reading of 162, pt admits to not taking her Janumet today. BMP and CBC without significant abnormalities. Troponin is negative. On reassessment patient states that her pain is all but relieved and she is ready for discharge. Patient advised to keep her appointment with her GI specialist as scheduled and start the new dose of Protonix tomorrow morning.  Lorayne Bender, PA-C 10/02/15 [place], MD 10/02/15 [phone]

## 2015-10-01 NOTE — ED Notes (Signed)
Pt c/o intermittent acid reflux x 2 weeks.  Pain score 7/10.  Pt reports taking pantoprazole and carafate w/o relief.  Reports some foods aggravate the pain, but ginger ale helps alleviate the pain.  Pt has an appointment w/ GI MD on 12/5.

## 2015-10-02 DIAGNOSIS — Z8739 Personal history of other diseases of the musculoskeletal system and connective tissue: Secondary | ICD-10-CM | POA: Diagnosis not present

## 2015-10-02 DIAGNOSIS — Z720 Tobacco use: Secondary | ICD-10-CM | POA: Diagnosis not present

## 2015-10-02 DIAGNOSIS — E785 Hyperlipidemia, unspecified: Secondary | ICD-10-CM | POA: Diagnosis not present

## 2015-10-02 DIAGNOSIS — I739 Peripheral vascular disease, unspecified: Secondary | ICD-10-CM | POA: Diagnosis not present

## 2015-10-02 DIAGNOSIS — Z8659 Personal history of other mental and behavioral disorders: Secondary | ICD-10-CM | POA: Diagnosis not present

## 2015-10-02 DIAGNOSIS — E119 Type 2 diabetes mellitus without complications: Secondary | ICD-10-CM | POA: Diagnosis not present

## 2015-10-02 DIAGNOSIS — I10 Essential (primary) hypertension: Secondary | ICD-10-CM | POA: Diagnosis not present

## 2015-10-02 DIAGNOSIS — K219 Gastro-esophageal reflux disease without esophagitis: Secondary | ICD-10-CM | POA: Diagnosis not present

## 2015-10-02 DIAGNOSIS — Z8709 Personal history of other diseases of the respiratory system: Secondary | ICD-10-CM | POA: Diagnosis not present

## 2015-10-02 LAB — LIPASE, BLOOD: Lipase: 94 U/L — ABNORMAL HIGH (ref 11–51)

## 2015-10-02 MED ORDER — PANTOPRAZOLE SODIUM 20 MG PO TBEC: 40.0000 mg | DELAYED_RELEASE_TABLET | Freq: Two times a day (BID) | ORAL | Status: DC

## 2015-10-02 NOTE — Discharge Instructions (Signed)
You have been seen today for epigastric pain. Your lab tests showed no abnormalities. You will start on the new dose of Protonix starting tomorrow morning. You should take this 30 min prior to your morning and evening meal. You should also take Maalox, 2 tablespoons, after each meal and a dose right before bedtime. Follow up with your GI specialist as soon as possible. Follow up with PCP as needed. Return to ED should symptoms worsen.

## 2015-10-04 DIAGNOSIS — H401133 Primary open-angle glaucoma, bilateral, severe stage: Secondary | ICD-10-CM | POA: Diagnosis not present

## 2015-10-04 DIAGNOSIS — H53433 Sector or arcuate defects, bilateral: Secondary | ICD-10-CM | POA: Diagnosis not present

## 2015-10-04 DIAGNOSIS — Z961 Presence of intraocular lens: Secondary | ICD-10-CM | POA: Diagnosis not present

## 2015-10-25 DIAGNOSIS — M25571 Pain in right ankle and joints of right foot: Secondary | ICD-10-CM | POA: Diagnosis not present

## 2015-10-25 DIAGNOSIS — K219 Gastro-esophageal reflux disease without esophagitis: Secondary | ICD-10-CM | POA: Diagnosis not present

## 2015-10-25 DIAGNOSIS — M7751 Other enthesopathy of right foot: Secondary | ICD-10-CM | POA: Diagnosis not present

## 2015-10-25 DIAGNOSIS — M216X1 Other acquired deformities of right foot: Secondary | ICD-10-CM | POA: Diagnosis not present

## 2015-10-29 ENCOUNTER — Encounter: Payer: Self-pay | Admitting: Vascular Surgery

## 2015-11-04 ENCOUNTER — Encounter: Payer: Self-pay | Admitting: Vascular Surgery

## 2015-11-04 ENCOUNTER — Ambulatory Visit (INDEPENDENT_AMBULATORY_CARE_PROVIDER_SITE_OTHER): Payer: Commercial Managed Care - HMO | Admitting: Vascular Surgery

## 2015-11-04 VITALS — BP 120/73 | HR 84 | Temp 98.0°F | Resp 16 | Ht 63.0 in | Wt 150.0 lb

## 2015-11-04 DIAGNOSIS — I739 Peripheral vascular disease, unspecified: Secondary | ICD-10-CM

## 2015-11-04 NOTE — Progress Notes (Signed)
[facility] [place] HISTORY AND PHYSICAL   History of Present Illness:  Patient is a 69 y.o. female who presents for evaluation of  Right leg claudication.   The patient has a known right superficial femoral artery occlusion from several years ago. She has been trying to manage this with walking alone. However, she recently retired and wishes to have revascularization. She is fairly limited in her walking distance. She also has several small operation she wanted to have on her foot and wanted to make sure she had adequate blood supply for healing. She has no problems in her left leg.Other medical problems include  Hypertension, hyperlipidemia , fibromyalgia, diabetes all of which are currently stable. She underwent left femoral to above-knee popliteal bypass in 2014. She currently is on aspirin.  Past Medical History  Diagnosis Date  . Tobacco abuse   . Hypertension   . Hyperlipidemia   . Peripheral vascular disease ([place])   . Fibromyalgia   . GERD (gastroesophageal reflux disease)   . Depression with anxiety   . Diabetes mellitus     Type II  . Bronchitis   . Bulging disc     in neck  . Pneumonia   . Overactive bladder     Past Surgical History  Procedure Laterality Date  . Aortogram w/ pta  05/14/08,  11-04-10    Bilateral aortogram w/ bilateral  SFA PTA  stenting   . Hand surgery Left 2010  . Tonsillectomy    . Dilation and curettage of uterus      X4  . Bunionectomy      L foot in the 1980s  . Colonoscopy      2014  . Epidural shots in neck     . Femoral-popliteal bypass graft Left 10/21/2013    Procedure: LEFT FEMORAL-POPLITEAL ARTERY BYPASS WITH SAPHENOUS VEIN GRAFT , POPLITEAL ENDARTERECTOMY ,INTRAOPERATIVE ARTERIOGRAM, vein patch angioplasty to popliteal artery;  Surgeon: Elam Dutch, MD;  Location: [facility] OR;  Service: Vascular;  Laterality: Left;  . Groin debridement Left 10/28/2013    Procedure: left inner thigh DEBRIDEMENT;  Surgeon: Elam Dutch, MD;  Location: [place];  Service: Vascular;  Laterality: Left;  . Abdominal aortagram N/A 08/15/2013    Procedure: ABDOMINAL Maxcine Ham;  Surgeon: Elam Dutch, MD;  Location: [facility] CATH LAB;  Service: Cardiovascular;  Laterality: N/A;  . Abdominal aortagram N/A 06/19/2014    Procedure: ABDOMINAL Maxcine Ham;  Surgeon: Elam Dutch, MD;  Location: [facility] CATH LAB;  Service: Cardiovascular;  Laterality: N/A;    Social History Social History  Substance Use Topics  . Smoking status: Current Every Day Smoker -- 0.10 packs/day    Types: Cigarettes  . Smokeless tobacco: Never Used     Comment: pt states that she is using the E cig only smokes 1-20 regular cigs per month  . Alcohol Use: 0.0 - 0.6 oz/week    0-1 Glasses of wine per week     Comment: Occasional    Family History Family History  Problem Relation Age of Onset  . Heart disease Father   . Heart attack Father     MI at age 72  . Hypertension Mother   . Alzheimer's disease Mother   . Hypertension Sister   . Diabetes Brother   . Hyperlipidemia Brother   . Hypertension Brother   . Heart disease Brother   . Heart attack Brother     Allergies  No Known Allergies   Current Outpatient  Prescriptions  Medication Sig Dispense Refill  . amLODipine-olmesartan (AZOR) 5-40 MG per tablet Take 1 tablet by mouth daily.    Marland Kitchen aspirin EC 81 MG tablet Take 81 mg by mouth daily.    Marland Kitchen atenolol (TENORMIN) 50 MG tablet Take 50 mg by mouth daily.    . naproxen sodium (ALEVE) 220 MG tablet Take 220 mg by mouth 2 (two) times daily as needed (for pain).    . NON FORMULARY Take 2 tablets by mouth daily. Hair Infinity    . pantoprazole (PROTONIX) 20 MG tablet Take 2 tablets (40 mg total) by mouth 2 (two) times daily. 120 tablet 0  . rosuvastatin (CRESTOR) 5 MG tablet Take 2.5 mg by mouth daily.    . sitaGLIPtan-metformin (JANUMET) 50-1000 MG per tablet Take 1 tablet by mouth 2 (two) times daily with a meal.    . sucralfate (CARAFATE) 1  GM/10ML suspension Take 10 mLs (1 g total) by mouth 4 (four) times daily -  with meals and at bedtime. 420 mL 0  . temazepam (RESTORIL) 30 MG capsule Take 30 mg by mouth at bedtime.    . TRAVATAN Z 0.004 % SOLN ophthalmic solution Place 1 drop into both eyes at bedtime.     . bismuth subsalicylate (PEPTO BISMOL) 262 MG/15ML suspension Take 30 mLs by mouth every 6 (six) hours as needed for indigestion or diarrhea or loose stools. Reported on 11/04/2015    . HYDROcodone-acetaminophen (NORCO/VICODIN) 5-325 MG per tablet Take 1 tablet by mouth every 4 (four) hours as needed for moderate pain. (Patient not taking: Reported on 11/04/2015) 20 tablet 0  . PROAIR HFA 108 (90 BASE) MCG/ACT inhaler Inhale 2 puffs into the lungs every 4 (four) hours as needed for wheezing or shortness of breath. Reported on 11/04/2015     No current facility-administered medications for this visit.    ROS:   General:  No weight loss, Fever, chills  HEENT: No recent headaches, no nasal bleeding, no visual changes, no sore throat  Neurologic: No dizziness, blackouts, seizures. No recent symptoms of stroke or mini- stroke. No recent episodes of slurred speech, or temporary blindness.  Cardiac: No recent episodes of chest pain/pressure, no shortness of breath at rest.  No shortness of breath with exertion.  Denies history of atrial fibrillation or irregular heartbeat  Vascular: No history of rest pain in feet.  No history of claudication.  No history of non-healing ulcer, No history of DVT   Pulmonary: No home oxygen, no productive cough, no hemoptysis,  No asthma or wheezing  Musculoskeletal:  [ ]  Arthritis, [ ]  Low back pain,  [ ]  Joint pain  Hematologic:No history of hypercoagulable state.  No history of easy bleeding.  No history of anemia  Gastrointestinal: No hematochezia or melena,  No gastroesophageal reflux, no trouble swallowing  Urinary: [ ]  chronic Kidney disease, [ ]  on HD - [ ]  MWF or [ ]  TTHS, [ ]   Burning with urination, [ ]  Frequent urination, [ ]  Difficulty urinating;   Skin: No rashes  Psychological: No history of anxiety,  No history of depression   Physical Examination  Filed Vitals:   11/04/15 1547  BP: 120/73  Pulse: 84  Temp: 98 F (36.7 C)  Resp: 16  Height: 5\' 3"  (1.6 m)  Weight: 150 lb (68.04 kg)  SpO2: 100%    Body mass index is 26.58 kg/(m^2).  General:  Alert and oriented, no acute distress HEENT: Normal Neck: No bruit or JVD Pulmonary:  Clear to auscultation bilaterally Cardiac: Regular Rate and Rhythm without murmur Abdomen: Soft, non-tender, non-distended, no mass, no scars Skin: No rash Extremity Pulses:  2+ radial, brachial, femoral,  2+ left her cells pedis absent right dorsalis pedis  and posterior tibial pulse Musculoskeletal: No deformity or edema  Neurologic: Upper and lower extremity motor 5/5 and symmetric  DATA:   Patient's last ABIs were reviewed from August 2016. Greater than 1 left 0.7 right   ASSESSMENT:   Short distance lifestyle limiting claudication right lower extremity. At this point patient wishes to proceed with revascularization.   PLAN:   #1 aortogram with lower extremity runoff for February  2017. Patient will also need cardiac risk stratification prior to bypass. We will arrange for both of these.  Ruta Hinds, MD [facility] [place] Office: [phone] Pager: [phone]

## 2015-11-05 ENCOUNTER — Telehealth: Payer: Self-pay | Admitting: Vascular Surgery

## 2015-11-05 NOTE — Telephone Encounter (Signed)
Spoke with pt - Appointment with Dr. Stanford Breed for cardiac clearance 11/16/15 9:45 am. [address]. Patient verbalized understanding.

## 2015-11-08 DIAGNOSIS — M722 Plantar fascial fibromatosis: Secondary | ICD-10-CM | POA: Diagnosis not present

## 2015-11-08 DIAGNOSIS — M71571 Other bursitis, not elsewhere classified, right ankle and foot: Secondary | ICD-10-CM | POA: Diagnosis not present

## 2015-11-10 NOTE — Progress Notes (Signed)
HPI: Preop eval; nuclear study 11/14 showed EF 55 and apical thinning; no ischemia. Scheduled for PV surgery and cardiology asked to evaluate preoperatively. Last seen 11/14. Patient has significant claudication in her right lower extremity that limits activities. She has some dyspnea on exertion. No chest pain. She cannot achieve 4 mets because of claudication.  Current Outpatient Prescriptions  Medication Sig Dispense Refill  . amLODipine-olmesartan (AZOR) 5-40 MG per tablet Take 1 tablet by mouth daily.    Marland Kitchen aspirin EC 81 MG tablet Take 81 mg by mouth daily.    Marland Kitchen atenolol (TENORMIN) 50 MG tablet Take 50 mg by mouth daily.    Marland Kitchen bismuth subsalicylate (PEPTO BISMOL) 262 MG/15ML suspension Take 30 mLs by mouth every 6 (six) hours as needed for indigestion or diarrhea or loose stools. Reported on 11/04/2015    . CARAFATE 1 GM/10ML suspension Take 10 mLs by mouth 4 (four) times daily as needed. (ACID REFLUX)  3  . naproxen sodium (ALEVE) 220 MG tablet Take 220 mg by mouth 2 (two) times daily as needed (for pain).    . NON FORMULARY Take 2 tablets by mouth daily. Hair Infinity    . pantoprazole (PROTONIX) 40 MG tablet Take 40 mg by mouth daily.    Marland Kitchen PROAIR HFA 108 (90 BASE) MCG/ACT inhaler Inhale 2 puffs into the lungs every 4 (four) hours as needed for wheezing or shortness of breath. Reported on 11/04/2015    . rosuvastatin (CRESTOR) 5 MG tablet Take 2.5 mg by mouth daily.    . sitaGLIPtan-metformin (JANUMET) 50-1000 MG per tablet Take 1 tablet by mouth 2 (two) times daily with a meal.    . temazepam (RESTORIL) 30 MG capsule Take 30 mg by mouth at bedtime.    . TRAVATAN Z 0.004 % SOLN ophthalmic solution Place 1 drop into both eyes at bedtime.      No current facility-administered medications for this visit.     Past Medical History  Diagnosis Date  . Tobacco abuse   . Hypertension   . Hyperlipidemia   . Peripheral vascular disease ([place])   . Fibromyalgia   . GERD  (gastroesophageal reflux disease)   . Depression with anxiety   . Diabetes mellitus     Type II  . Bronchitis   . Bulging disc     in neck  . Pneumonia   . Overactive bladder     Past Surgical History  Procedure Laterality Date  . Aortogram w/ pta  05/14/08,  11-04-10    Bilateral aortogram w/ bilateral  SFA PTA  stenting   . Hand surgery Left 2010  . Tonsillectomy    . Dilation and curettage of uterus      X4  . Bunionectomy      L foot in the 1980s  . Colonoscopy      2014  . Epidural shots in neck     . Femoral-popliteal bypass graft Left 10/21/2013    Procedure: LEFT FEMORAL-POPLITEAL ARTERY BYPASS WITH SAPHENOUS VEIN GRAFT , POPLITEAL ENDARTERECTOMY ,INTRAOPERATIVE ARTERIOGRAM, vein patch angioplasty to popliteal artery;  Surgeon: Elam Dutch, MD;  Location: [facility] OR;  Service: Vascular;  Laterality: Left;  . Groin debridement Left 10/28/2013    Procedure: left inner thigh DEBRIDEMENT;  Surgeon: Elam Dutch, MD;  Location: [place];  Service: Vascular;  Laterality: Left;  . Abdominal aortagram N/A 08/15/2013    Procedure: ABDOMINAL Maxcine Ham;  Surgeon: Elam Dutch, MD;  Location: [facility] CATH  LAB;  Service: Cardiovascular;  Laterality: N/A;  . Abdominal aortagram N/A 06/19/2014    Procedure: ABDOMINAL AORTAGRAM;  Surgeon: Elam Dutch, MD;  Location: [facility] CATH LAB;  Service: Cardiovascular;  Laterality: N/A;    Social History   Social History  . Marital Status: Single    Spouse Name: N/A  . Number of Children: 1  . Years of Education: N/A   Occupational History  .  [employer]   Social History Main Topics  . Smoking status: Current Every Day Smoker -- 0.10 packs/day    Types: Cigarettes  . Smokeless tobacco: Never Used     Comment: pt states that she is using the E cig only smokes 1-20 regular cigs per month  . Alcohol Use: 0.0 - 0.6 oz/week    0-1 Glasses of wine per week     Comment: Occasional  . Drug Use: No  . Sexual Activity: Not on file   Other Topics  Concern  . Not on file   Social History Narrative    ROS: no fevers or chills, productive cough, hemoptysis, dysphasia, odynophagia, melena, hematochezia, dysuria, hematuria, rash, seizure activity, orthopnea, PND, pedal edema. Remaining systems are negative.  Physical Exam: Well-developed well-nourished in no acute distress.  Skin is warm and dry.  HEENT is normal.  Neck is supple.  Chest is clear to auscultation with normal expansion.  Cardiovascular exam is regular rate and rhythm.  Abdominal exam nontender or distended. No masses palpated. Extremities show no edema. neuro grossly intact  ECG 10/02/2015-sinus rhythm with minor nonspecific ST changes.

## 2015-11-16 ENCOUNTER — Encounter: Payer: Self-pay | Admitting: Cardiology

## 2015-11-16 ENCOUNTER — Ambulatory Visit (INDEPENDENT_AMBULATORY_CARE_PROVIDER_SITE_OTHER): Payer: Commercial Managed Care - HMO | Admitting: Cardiology

## 2015-11-16 VITALS — BP 120/70 | HR 50 | Ht 63.0 in | Wt 149.6 lb

## 2015-11-16 DIAGNOSIS — E785 Hyperlipidemia, unspecified: Secondary | ICD-10-CM

## 2015-11-16 DIAGNOSIS — Z01810 Encounter for preprocedural cardiovascular examination: Secondary | ICD-10-CM | POA: Diagnosis not present

## 2015-11-16 DIAGNOSIS — I739 Peripheral vascular disease, unspecified: Secondary | ICD-10-CM

## 2015-11-16 DIAGNOSIS — I10 Essential (primary) hypertension: Secondary | ICD-10-CM | POA: Diagnosis not present

## 2015-11-16 NOTE — Patient Instructions (Signed)
Your physician recommends that you schedule a follow-up appointment in: As Needed  Your physician has requested that you have a lexiscan myoview. For further information please visit [email or web address]. Please follow instruction sheet, as given.        Happy New Year!!

## 2015-11-16 NOTE — Assessment & Plan Note (Signed)
Patient is scheduled for peripheral vascular surgery.Her activities are limited by claudication and she cannot achieve 4 mets. Multiple risk factors including tobacco abuse, diabetes mellitus, hypertension and hyperlipidemia. Plan Lexiscan nuclear study for risk stratification preoperatively.

## 2015-11-16 NOTE — Assessment & Plan Note (Signed)
Blood pressure controlled. Continue present medications. 

## 2015-11-16 NOTE — Assessment & Plan Note (Signed)
Continue statin. 

## 2015-11-16 NOTE — Assessment & Plan Note (Signed)
Management per vascular surgery. Continue aspirin and statin.

## 2015-11-19 ENCOUNTER — Telehealth (HOSPITAL_COMMUNITY): Payer: Self-pay

## 2015-11-19 NOTE — Telephone Encounter (Signed)
Encounter complete. 

## 2015-11-24 ENCOUNTER — Telehealth (HOSPITAL_COMMUNITY): Payer: Self-pay

## 2015-11-24 ENCOUNTER — Ambulatory Visit (HOSPITAL_COMMUNITY)
Admission: RE | Admit: 2015-11-24 | Discharge: 2015-11-24 | Disposition: A | Discharge status: Home / Self Care | Payer: Commercial Managed Care - HMO | Source: Ambulatory Visit | Attending: Cardiovascular Disease | Admitting: Cardiovascular Disease

## 2015-11-24 DIAGNOSIS — Z01810 Encounter for preprocedural cardiovascular examination: Secondary | ICD-10-CM

## 2015-11-24 DIAGNOSIS — Z8249 Family history of ischemic heart disease and other diseases of the circulatory system: Secondary | ICD-10-CM | POA: Insufficient documentation

## 2015-11-24 DIAGNOSIS — R0609 Other forms of dyspnea: Secondary | ICD-10-CM | POA: Diagnosis not present

## 2015-11-24 DIAGNOSIS — E119 Type 2 diabetes mellitus without complications: Secondary | ICD-10-CM | POA: Diagnosis not present

## 2015-11-24 DIAGNOSIS — I739 Peripheral vascular disease, unspecified: Secondary | ICD-10-CM

## 2015-11-24 DIAGNOSIS — F17200 Nicotine dependence, unspecified, uncomplicated: Secondary | ICD-10-CM | POA: Insufficient documentation

## 2015-11-24 DIAGNOSIS — I10 Essential (primary) hypertension: Secondary | ICD-10-CM | POA: Insufficient documentation

## 2015-11-24 LAB — MYOCARDIAL PERFUSION IMAGING
LV dias vol: 80 mL
LV sys vol: 34 mL
Peak HR: 82 {beats}/min
Rest HR: 62 {beats}/min
SDS: 0
SRS: 1
SSS: 1
TID: 1.31

## 2015-11-24 MED ORDER — TECHNETIUM TC 99M SESTAMIBI GENERIC - CARDIOLITE
10.7000 | Freq: Once | INTRAVENOUS | Status: AC | PRN
  Administered 2015-11-24: 10.7 via INTRAVENOUS

## 2015-11-24 MED ORDER — TECHNETIUM TC 99M SESTAMIBI GENERIC - CARDIOLITE
29.1000 | Freq: Once | INTRAVENOUS | Status: AC | PRN
  Administered 2015-11-24: 29.1 via INTRAVENOUS

## 2015-11-24 MED ORDER — AMINOPHYLLINE 25 MG/ML IV SOLN
75.0000 mg | Freq: Once | INTRAVENOUS | Status: AC
  Administered 2015-11-24: 75 mg via INTRAVENOUS

## 2015-11-24 MED ORDER — REGADENOSON 0.4 MG/5ML IV SOLN
0.4000 mg | Freq: Once | INTRAVENOUS | Status: AC
  Administered 2015-11-24: 0.4 mg via INTRAVENOUS

## 2015-11-24 NOTE — Telephone Encounter (Signed)
Encounter complete. 

## 2015-12-03 ENCOUNTER — Other Ambulatory Visit: Payer: Self-pay

## 2015-12-08 ENCOUNTER — Encounter: Payer: Self-pay | Admitting: Vascular Surgery

## 2015-12-09 ENCOUNTER — Other Ambulatory Visit: Payer: Self-pay

## 2015-12-16 ENCOUNTER — Ambulatory Visit: Payer: Commercial Managed Care - HMO | Admitting: Vascular Surgery

## 2015-12-20 DIAGNOSIS — M25571 Pain in right ankle and joints of right foot: Secondary | ICD-10-CM | POA: Diagnosis not present

## 2015-12-20 DIAGNOSIS — M7751 Other enthesopathy of right foot: Secondary | ICD-10-CM | POA: Diagnosis not present

## 2015-12-22 DIAGNOSIS — Z961 Presence of intraocular lens: Secondary | ICD-10-CM | POA: Diagnosis not present

## 2015-12-22 DIAGNOSIS — H401133 Primary open-angle glaucoma, bilateral, severe stage: Secondary | ICD-10-CM | POA: Diagnosis not present

## 2015-12-22 DIAGNOSIS — H53433 Sector or arcuate defects, bilateral: Secondary | ICD-10-CM | POA: Diagnosis not present

## 2015-12-22 DIAGNOSIS — R94112 Abnormal visually evoked potential [VEP]: Secondary | ICD-10-CM | POA: Diagnosis not present

## 2015-12-24 ENCOUNTER — Ambulatory Visit (HOSPITAL_COMMUNITY)
Admission: RE | Admit: 2015-12-24 | Discharge: 2015-12-24 | Disposition: A | Discharge status: Home / Self Care | Payer: Commercial Managed Care - HMO | Source: Ambulatory Visit | Attending: Vascular Surgery | Admitting: Vascular Surgery

## 2015-12-24 ENCOUNTER — Encounter (HOSPITAL_COMMUNITY)
Admission: RE | Disposition: A | Discharge status: Home / Self Care | Payer: Commercial Managed Care - HMO | Source: Ambulatory Visit | Attending: Vascular Surgery

## 2015-12-24 DIAGNOSIS — F418 Other specified anxiety disorders: Secondary | ICD-10-CM | POA: Diagnosis not present

## 2015-12-24 DIAGNOSIS — E785 Hyperlipidemia, unspecified: Secondary | ICD-10-CM | POA: Diagnosis not present

## 2015-12-24 DIAGNOSIS — Z7982 Long term (current) use of aspirin: Secondary | ICD-10-CM | POA: Diagnosis not present

## 2015-12-24 DIAGNOSIS — I70312 Atherosclerosis of unspecified type of bypass graft(s) of the extremities with intermittent claudication, left leg: Secondary | ICD-10-CM | POA: Insufficient documentation

## 2015-12-24 DIAGNOSIS — Z7984 Long term (current) use of oral hypoglycemic drugs: Secondary | ICD-10-CM | POA: Diagnosis not present

## 2015-12-24 DIAGNOSIS — Z8249 Family history of ischemic heart disease and other diseases of the circulatory system: Secondary | ICD-10-CM | POA: Insufficient documentation

## 2015-12-24 DIAGNOSIS — Y812 Prosthetic and other implants, materials and accessory general- and plastic-surgery devices associated with adverse incidents: Secondary | ICD-10-CM | POA: Diagnosis not present

## 2015-12-24 DIAGNOSIS — F17210 Nicotine dependence, cigarettes, uncomplicated: Secondary | ICD-10-CM | POA: Diagnosis not present

## 2015-12-24 DIAGNOSIS — E1151 Type 2 diabetes mellitus with diabetic peripheral angiopathy without gangrene: Secondary | ICD-10-CM | POA: Insufficient documentation

## 2015-12-24 DIAGNOSIS — T82856A Stenosis of peripheral vascular stent, initial encounter: Secondary | ICD-10-CM | POA: Insufficient documentation

## 2015-12-24 DIAGNOSIS — M797 Fibromyalgia: Secondary | ICD-10-CM | POA: Diagnosis not present

## 2015-12-24 DIAGNOSIS — I70213 Atherosclerosis of native arteries of extremities with intermittent claudication, bilateral legs: Secondary | ICD-10-CM | POA: Insufficient documentation

## 2015-12-24 DIAGNOSIS — K219 Gastro-esophageal reflux disease without esophagitis: Secondary | ICD-10-CM | POA: Insufficient documentation

## 2015-12-24 DIAGNOSIS — I10 Essential (primary) hypertension: Secondary | ICD-10-CM | POA: Diagnosis not present

## 2015-12-24 HISTORY — PX: PERIPHERAL VASCULAR CATHETERIZATION: SHX172C

## 2015-12-24 HISTORY — PX: LOWER EXTREMITY ANGIOGRAM: SHX5508

## 2015-12-24 LAB — POCT ACTIVATED CLOTTING TIME
Activated Clotting Time: 240 seconds
Activated Clotting Time: 183 seconds

## 2015-12-24 LAB — POCT I-STAT, CHEM 8
BUN: 24 mg/dL — ABNORMAL HIGH (ref 6–20)
Calcium, Ion: 1.16 mmol/L (ref 1.13–1.30)
Chloride: 106 mmol/L (ref 101–111)
Creatinine, Ser: 0.7 mg/dL (ref 0.44–1.00)
Glucose, Bld: 126 mg/dL — ABNORMAL HIGH (ref 65–99)
HCT: 36 % (ref 36.0–46.0)
Hemoglobin: 12.2 g/dL (ref 12.0–15.0)
Potassium: 3.7 mmol/L (ref 3.5–5.1)
Sodium: 143 mmol/L (ref 135–145)
TCO2: 24 mmol/L (ref 0–100)

## 2015-12-24 LAB — GLUCOSE, CAPILLARY: Glucose-Capillary: 108 mg/dL — ABNORMAL HIGH (ref 65–99)

## 2015-12-24 SURGERY — ABDOMINAL AORTOGRAM
Anesthesia: LOCAL

## 2015-12-24 MED ORDER — IODIXANOL 320 MG/ML IV SOLN
INTRAVENOUS | Status: DC | PRN
  Administered 2015-12-24: 200 mL via INTRAVENOUS

## 2015-12-24 MED ORDER — LABETALOL HCL 5 MG/ML IV SOLN: 10.0000 mg | INTRAVENOUS | Status: DC | PRN

## 2015-12-24 MED ORDER — OXYCODONE HCL 5 MG PO TABS: 5.0000 mg | ORAL_TABLET | ORAL | Status: DC | PRN

## 2015-12-24 MED ORDER — HEPARIN (PORCINE) IN NACL 2-0.9 UNIT/ML-% IJ SOLN
INTRAMUSCULAR | Status: AC
  Filled 2015-12-24: qty 1000

## 2015-12-24 MED ORDER — SODIUM CHLORIDE 0.9 % IV SOLN: 500.0000 mL | Freq: Once | INTRAVENOUS | Status: DC | PRN

## 2015-12-24 MED ORDER — ONDANSETRON HCL 4 MG/2ML IJ SOLN: 4.0000 mg | Freq: Four times a day (QID) | INTRAMUSCULAR | Status: DC | PRN

## 2015-12-24 MED ORDER — ACETAMINOPHEN 325 MG PO TABS: 325.0000 mg | ORAL_TABLET | ORAL | Status: DC | PRN

## 2015-12-24 MED ORDER — SODIUM CHLORIDE 0.9 % IV SOLN
INTRAVENOUS | Status: DC
  Administered 2015-12-24: 07:00:00 via INTRAVENOUS

## 2015-12-24 MED ORDER — HEPARIN SODIUM (PORCINE) 1000 UNIT/ML IJ SOLN
INTRAMUSCULAR | Status: DC | PRN
  Administered 2015-12-24: 6000 [IU] via INTRAVENOUS

## 2015-12-24 MED ORDER — HYDRALAZINE HCL 20 MG/ML IJ SOLN: 5.0000 mg | INTRAMUSCULAR | Status: DC | PRN

## 2015-12-24 MED ORDER — ACETAMINOPHEN 325 MG RE SUPP: 325.0000 mg | RECTAL | Status: DC | PRN

## 2015-12-24 MED ORDER — SODIUM CHLORIDE 0.45 % IV SOLN
INTRAVENOUS | Status: DC
  Administered 2015-12-24: 11:00:00 via INTRAVENOUS

## 2015-12-24 MED ORDER — METOPROLOL TARTRATE 1 MG/ML IV SOLN: 2.0000 mg | INTRAVENOUS | Status: DC | PRN

## 2015-12-24 MED ORDER — MORPHINE SULFATE (PF) 10 MG/ML IV SOLN: 2.0000 mg | INTRAVENOUS | Status: DC | PRN

## 2015-12-24 MED ORDER — LIDOCAINE HCL (PF) 1 % IJ SOLN
INTRAMUSCULAR | Status: DC | PRN
  Administered 2015-12-24: 10 mL

## 2015-12-24 MED ORDER — LIDOCAINE HCL (PF) 1 % IJ SOLN
INTRAMUSCULAR | Status: AC
  Filled 2015-12-24: qty 30

## 2015-12-24 SURGICAL SUPPLY — 19 items
BALLN LUTONIX DCB 4X40X130 (BALLOONS) ×4
BALLN MUSTANG 4X40X135 (BALLOONS) ×4
BALLOON LUTONIX DCB 4X40X130 (BALLOONS) IMPLANT
BALLOON MUSTANG 4X40X135 (BALLOONS) IMPLANT
CATH ANGIO 5F PIGTAIL 65CM (CATHETERS) ×1 IMPLANT
CATH CROSS OVER TEMPO 5F (CATHETERS) ×1 IMPLANT
CATH STRAIGHT 5FR 65CM (CATHETERS) ×1 IMPLANT
COVER PRB 48X5XTLSCP FOLD TPE (BAG) IMPLANT
COVER PROBE 5X48 (BAG) ×4
GUIDEWIRE ANGLED .035X150CM (WIRE) ×1 IMPLANT
KIT ENCORE 26 ADVANTAGE (KITS) ×1 IMPLANT
KIT PV (KITS) ×4 IMPLANT
SHEATH PINNACLE 5F 10CM (SHEATH) ×1 IMPLANT
SHEATH PINNACLE ST 6F 45CM (SHEATH) ×1 IMPLANT
SYR MEDRAD MARK V 150ML (SYRINGE) ×4 IMPLANT
TRANSDUCER W/STOPCOCK (MISCELLANEOUS) ×4 IMPLANT
TRAY PV CATH (CUSTOM PROCEDURE TRAY) ×4 IMPLANT
WIRE HITORQ VERSACORE ST 145CM (WIRE) ×1 IMPLANT
WIRE VERSACORE LOC 115CM (WIRE) ×1 IMPLANT

## 2015-12-24 NOTE — Progress Notes (Signed)
Order for sheath removal verified per post procedural orders. Procedure explained to patient and Rt femoral artery access site assessed: level 0, palpable dorsalis pedis and posterior tibial pulses. 6French Sheath removed and manual pressure applied for 25 minutes. Pre, peri, & post procedural vitals: HR 54, RR 14, O2 Sat upper 97, BP 137/66, Pain 0. Distal pulses remained intact after sheath removal. Access site level 0 and dressed with 4X4 gauze and tegaderm.  Colletta Maryland, [facility]IS confirmed condition of site. Post procedural instructions discussed with return demonstration from patient.

## 2015-12-24 NOTE — H&P (Signed)
[facility] [place] HISTORY AND PHYSICAL    History of Present Illness:  Patient is a 70 y.o. female who presents for evaluation of  Right leg claudication.   The patient has a known right superficial femoral artery occlusion from several years ago. She has been trying to manage this with walking alone. However, she recently retired and wishes to have revascularization. She is fairly limited in her walking distance. She also has several small operation she wanted to have on her foot and wanted to make sure she had adequate blood supply for healing. She has no problems in her left leg.Other medical problems include  Hypertension, hyperlipidemia , fibromyalgia, diabetes all of which are currently stable. She underwent left femoral to above-knee popliteal bypass in 2014. She currently is on aspirin.    Past Medical History   Diagnosis  Date   .  Tobacco abuse     .  Hypertension     .  Hyperlipidemia     .  Peripheral vascular disease ([place])     .  Fibromyalgia     .  GERD (gastroesophageal reflux disease)     .  Depression with anxiety     .  Diabetes mellitus         Type II   .  Bronchitis     .  Bulging disc         in neck   .  Pneumonia     .  Overactive bladder         Past Surgical History   Procedure  Laterality  Date   .  Aortogram w/ pta    05/14/08,  11-04-10       Bilateral aortogram w/ bilateral  SFA PTA  stenting    .  Hand surgery  Left  2010   .  Tonsillectomy       .  Dilation and curettage of uterus           X4   .  Bunionectomy           L foot in the 1980s   .  Colonoscopy           2014   .  Epidural shots in neck        .  Femoral-popliteal bypass graft  Left  10/21/2013       Procedure: LEFT FEMORAL-POPLITEAL ARTERY BYPASS WITH SAPHENOUS VEIN GRAFT , POPLITEAL ENDARTERECTOMY ,INTRAOPERATIVE ARTERIOGRAM, vein patch angioplasty to popliteal artery;  Surgeon: Elam Dutch, MD;  Location: [facility] OR;  Service: Vascular;  Laterality: Left;   .   Groin debridement  Left  10/28/2013       Procedure: left inner thigh DEBRIDEMENT;  Surgeon: Elam Dutch, MD;  Location: [place];  Service: Vascular;  Laterality: Left;   .  Abdominal aortagram  N/A  08/15/2013       Procedure: ABDOMINAL Maxcine Ham;  Surgeon: Elam Dutch, MD;  Location: [facility] CATH LAB;  Service: Cardiovascular;  Laterality: N/A;   .  Abdominal aortagram  N/A  06/19/2014       Procedure: ABDOMINAL Maxcine Ham;  Surgeon: Elam Dutch, MD;  Location: [facility] CATH LAB;  Service: Cardiovascular;  Laterality: N/A;     Social History Social History   Substance Use Topics   .  Smoking status:  Current Every Day Smoker -- 0.10 packs/day       Types:  Cigarettes   .  Smokeless tobacco:  Never Used         Comment: pt states that she is using the E cig only smokes 1-20 regular cigs per month   .  Alcohol Use:  0.0 - 0.6 oz/week       0-1 Glasses of wine per week         Comment: Occasional     Family History Family History   Problem  Relation  Age of Onset   .  Heart disease  Father     .  Heart attack  Father         MI at age 81   .  Hypertension  Mother     .  Alzheimer's disease  Mother     .  Hypertension  Sister     .  Diabetes  Brother     .  Hyperlipidemia  Brother     .  Hypertension  Brother     .  Heart disease  Brother     .  Heart attack  Brother       Allergies  No Known Allergies     Current Outpatient Prescriptions   Medication  Sig  Dispense  Refill   .  amLODipine-olmesartan (AZOR) 5-40 MG per tablet  Take 1 tablet by mouth daily.       Marland Kitchen  aspirin EC 81 MG tablet  Take 81 mg by mouth daily.       Marland Kitchen  atenolol (TENORMIN) 50 MG tablet  Take 50 mg by mouth daily.       .  naproxen sodium (ALEVE) 220 MG tablet  Take 220 mg by mouth 2 (two) times daily as needed (for pain).       .  NON FORMULARY  Take 2 tablets by mouth daily. Hair Infinity       .  pantoprazole (PROTONIX) 20 MG tablet  Take 2 tablets (40 mg total) by mouth 2 (two) times daily.  120  tablet  0   .  rosuvastatin (CRESTOR) 5 MG tablet  Take 2.5 mg by mouth daily.       .  sitaGLIPtan-metformin (JANUMET) 50-1000 MG per tablet  Take 1 tablet by mouth 2 (two) times daily with a meal.       .  sucralfate (CARAFATE) 1 GM/10ML suspension  Take 10 mLs (1 g total) by mouth 4 (four) times daily -  with meals and at bedtime.  420 mL  0   .  temazepam (RESTORIL) 30 MG capsule  Take 30 mg by mouth at bedtime.       .  TRAVATAN Z 0.004 % SOLN ophthalmic solution  Place 1 drop into both eyes at bedtime.        .  bismuth subsalicylate (PEPTO BISMOL) 262 MG/15ML suspension  Take 30 mLs by mouth every 6 (six) hours as needed for indigestion or diarrhea or loose stools. Reported on 11/04/2015       .  HYDROcodone-acetaminophen (NORCO/VICODIN) 5-325 MG per tablet  Take 1 tablet by mouth every 4 (four) hours as needed for moderate pain. (Patient not taking: Reported on 11/04/2015)  20 tablet  0   .  PROAIR HFA 108 (90 BASE) MCG/ACT inhaler  Inhale 2 puffs into the lungs every 4 (four) hours as needed for wheezing or shortness of breath. Reported on 11/04/2015          No current facility-administered medications for this visit.     ROS:  General:  No weight loss, Fever, chills  HEENT: No recent headaches, no nasal bleeding, no visual changes, no sore throat  Neurologic: No dizziness, blackouts, seizures. No recent symptoms of stroke or mini- stroke. No recent episodes of slurred speech, or temporary blindness.  Cardiac: No recent episodes of chest pain/pressure, no shortness of breath at rest.  No shortness of breath with exertion.  Denies history of atrial fibrillation or irregular heartbeat  Vascular: No history of rest pain in feet.  No history of claudication.  No history of non-healing ulcer, No history of DVT    Pulmonary: No home oxygen, no productive cough, no hemoptysis,  No asthma or wheezing  Musculoskeletal:  [ ]  Arthritis, [ ]  Low back pain,  [ ]  Joint pain  Hematologic:No  history of hypercoagulable state.  No history of easy bleeding.  No history of anemia  Gastrointestinal: No hematochezia or melena,  No gastroesophageal reflux, no trouble swallowing  Urinary: [ ]  chronic Kidney disease, [ ]  on HD - [ ]  MWF or [ ]  TTHS, [ ]  Burning with urination, [ ]  Frequent urination, [ ]  Difficulty urinating;    Skin: No rashes  Psychological: No history of anxiety,  No history of depression   Physical Examination     Filed Vitals:   12/24/15 0723  BP: 133/74  Pulse: 62  Temp: 97.8 F (36.6 C)  TempSrc: Oral  Resp: 16  Height: 5\' 3"  (1.6 m)  Weight: 150 lb (68.04 kg)  SpO2: 99%    General:  Alert and oriented, no acute distress HEENT: Normal Neck: No bruit or JVD Pulmonary: Clear to auscultation bilaterally Cardiac: Regular Rate and Rhythm without murmur Abdomen: Soft, non-tender, non-distended, no mass, no scars Skin: No rash Extremity Pulses:  2+ radial, brachial, femoral,  2+ left her cells pedis absent right dorsalis pedis  and posterior tibial pulse Musculoskeletal: No deformity or edema     Neurologic: Upper and lower extremity motor 5/5 and symmetric  DATA:   Patient's last ABIs were reviewed from August 2016. Greater than 1 left 0.7 right   ASSESSMENT:   Short distance lifestyle limiting claudication right lower extremity. At this point patient wishes to proceed with revascularization.   PLAN:   #1 aortogram with lower extremity runoff for February  2017. Patient will also need cardiac risk stratification prior to bypass. We will arrange for both of these.  Ruta Hinds, MD [facility] [place] Office: [phone] Pager: [phone]

## 2015-12-24 NOTE — Op Note (Addendum)
Procedure: Abdominal aortogram with bilateral lower extremity runoff, angioplasty left popliteal artery (Lutonix 4 x 40)  Preoperative diagnosis: Claudication Postoperative diagnosis: Same  Anesthesia: Local  Operative findings: #1 occlusion right superficial femoral artery with reconstitution of the above-knee popliteal artery primary runoff via the anterior tibial but patent posterior tibial and peroneal                                 #2 70% stenosis left popliteal artery just distal to the pre-existing left femoral above-knee popliteal bypass graft similar runoff to right with anatomic variant of anterior tibial artery takeoff above the knee joint  Operative details: After obtaining informed consent, the patient was taken to the [place] lab. The patient was placed in supine position the Angio table. Both groins prepped and draped in usual sterile fashion. Local anesthesia was a fair of the right common femoral artery. Ultrasound was used to identify the right common femoral artery. Using ultrasound guidance the right common femoral artery was cannulated without difficulty. An 035 versacore wire was threaded up the abdominal aorta under fluoroscopic guidance. A 5 French sheath was placed over the guidewire in the right common femoral artery. This was thoroughly flushed with heparinized saline. A 5 French pigtail catheter was then placed over the guidewire into the abdominal aorta and abdominal aortogram was obtained in AP projection. The left and right renal arteries are patent. The infrarenal abdominal aorta is patent. The left and right common internal and external iliac arteries are patent. Next the Baker catheter was pulled down just above the aortic bifurcation and pelvic angiogram was performed which confirmed the above findings. Bilateral extremity runoff views were obtained through the pigtail catheter.  In the right lower extremity, there is a flush occlusion of the right superficial femoral  artery with reconstitution of the above-knee popliteal artery. The profunda femoris is patent. However the secondary branch has a 90% stenosis. The remainder of the profunda is patent.  The right common femoral arteries right widely patent. The above-knee and below-knee popliteal artery is widely patent. Dominant runoff vessel to the right foot is the anterior tibial artery. However the peroneal and posterior tibial arteries are patent but quite small.  In the left lower extremity, the left common femoral artery is patent. The native left superficial femoral artery is occluded. Of note there are bilateral superficial femoral artery stents in the native circulation which are both occluded. In the left leg the left leg has a bypass originating from the left common femoral artery and terminating in the above-knee popliteal artery. This is widely patent. There is a narrowing and what appears to be either the distal segment of the bypass graft or the native above-knee popliteal artery which approaches 70%. The below-knee popliteal artery is patent. There is three-vessel runoff to the left foot with again the anterior tibial artery being the dominant runoff vessel with the peroneal and posterior tibial arteries being quite small. There is an anatomic variant of the anterior tibial artery originating above the knee joint.  In order to get better opacification of the left leg, the pigtail catheter was exchanged over a guidewire for a 5 [place] crossover catheter. This was used to selectively catheterize the left common iliac artery. An 035 angled Glidewire was then advanced down into the distal left external iliac artery. The crossover catheter was exchanged for a 5 French straight catheter. Left lower extremity arteriogram was obtained focusing  on the distal anastomotic area the bypass. This again confirms that the narrowing is about 70%. The native circulation above and below this measures about 4 mm in diameter. The  lesion is approximately 2 cm in length.  At this point it was decided to intervene on the 70% left popliteal artery stenosis. The patient was given 6000 units of intravenous heparin. The 5 French straight catheter was removed. The guidewire was left in place. The 5 French sheath was exchanged for a 6 French Terumo sheath and this was advanced up and over the aortic bifurcation. I was then able to advance the versacore wire into the bypass graft. The sheath was then advanced over this. The guidewire was then advanced across the stenosis in the distal bypass. ACT was confirmed to be greater than 200. A 4 x 4 Mustang balloon was then advanced to the level of the stenosis and inflated to 10 atm for 1 minute. This was then deflated and removed. A 4 x 4 centimeter Lutonix balloon was then advanced to the level lesion and this was also inflated to 6 atm for 2 minutes. This was then removed. A completion arteriogram shows wide patency was essentially 0 residual stenosis of the narrowing of the above-knee popliteal artery. The Lutonix balloon and the guidewire were removed. The sheath was pulled back over the guidewire back into the right hemipelvis to be removed after the ACT is less than 175.  The patient tolerated the procedure well and there were no complications. The patient was taken to the holding area in stable condition.  Operative management: The patient will be scheduled for a right femoral above-knee popliteal bypass in mid March. She will need intermittent surveillance of the area of angioplasty of her pre-existing left femoropopliteal bypass. She'll be maintained on aspirin alone for now for antiplatelet.  Ruta Hinds, MD [facility] [place] Office: [phone] Pager: [phone]

## 2015-12-24 NOTE — Progress Notes (Signed)
Pt to bathroom, tolerated well.  Right groin intact level 0 after ambulation

## 2015-12-24 NOTE — Discharge Instructions (Signed)

## 2015-12-27 ENCOUNTER — Encounter (HOSPITAL_COMMUNITY): Payer: Self-pay | Admitting: Vascular Surgery

## 2015-12-27 MED FILL — Heparin Sodium (Porcine) 2 Unit/ML in Sodium Chloride 0.9%: INTRAMUSCULAR | Qty: 1000 | Status: AC

## 2016-01-12 DIAGNOSIS — R319 Hematuria, unspecified: Secondary | ICD-10-CM | POA: Diagnosis not present

## 2016-01-13 ENCOUNTER — Encounter: Payer: Self-pay | Admitting: Vascular Surgery

## 2016-01-13 DIAGNOSIS — M25571 Pain in right ankle and joints of right foot: Secondary | ICD-10-CM | POA: Diagnosis not present

## 2016-01-13 DIAGNOSIS — M7751 Other enthesopathy of right foot: Secondary | ICD-10-CM | POA: Diagnosis not present

## 2016-01-20 ENCOUNTER — Ambulatory Visit (INDEPENDENT_AMBULATORY_CARE_PROVIDER_SITE_OTHER): Payer: Medicare Other | Admitting: Vascular Surgery

## 2016-01-20 ENCOUNTER — Encounter: Payer: Self-pay | Admitting: Vascular Surgery

## 2016-01-20 VITALS — BP 140/86 | HR 56 | Ht 63.0 in | Wt 148.0 lb

## 2016-01-20 DIAGNOSIS — Z7984 Long term (current) use of oral hypoglycemic drugs: Secondary | ICD-10-CM | POA: Diagnosis not present

## 2016-01-20 DIAGNOSIS — I739 Peripheral vascular disease, unspecified: Secondary | ICD-10-CM | POA: Diagnosis not present

## 2016-01-20 DIAGNOSIS — I10 Essential (primary) hypertension: Secondary | ICD-10-CM | POA: Diagnosis not present

## 2016-01-20 DIAGNOSIS — E7800 Pure hypercholesterolemia, unspecified: Secondary | ICD-10-CM | POA: Diagnosis not present

## 2016-01-20 DIAGNOSIS — F17200 Nicotine dependence, unspecified, uncomplicated: Secondary | ICD-10-CM | POA: Diagnosis not present

## 2016-01-20 DIAGNOSIS — E1165 Type 2 diabetes mellitus with hyperglycemia: Secondary | ICD-10-CM | POA: Diagnosis not present

## 2016-01-20 DIAGNOSIS — J449 Chronic obstructive pulmonary disease, unspecified: Secondary | ICD-10-CM | POA: Diagnosis not present

## 2016-01-21 NOTE — Progress Notes (Signed)
[facility] [place] HISTORY AND PHYSICAL    History of Present Illness:  Patient is a 70 y.o. female who presents for evaluation of  Right leg claudication.   The patient has a known right superficial femoral artery occlusion from several years ago. She has been trying to manage this with walking alone. However, she recently retired and wishes to have revascularization. She is fairly limited in her walking distance. She also has several small operation she wanted to have on her foot and wanted to make sure she had adequate blood supply for healing. She has no problems in her left leg.Other medical problems include  Hypertension, hyperlipidemia , fibromyalgia, diabetes all of which are currently stable. She underwent left femoral to above-knee popliteal bypass in 2014. She currently is on aspirin. Recent arteriogram shows anatomically speaking she would be a candidate for right femoral to above-knee popliteal bypass. At the time of that arteriogram she underwent angioplasty of the distal anastomosis of her previously placed left femoral to above-knee popliteal bypass.    Past Medical History   Diagnosis  Date   .  Tobacco abuse     .  Hypertension     .  Hyperlipidemia     .  Peripheral vascular disease ([place])     .  Fibromyalgia     .  GERD (gastroesophageal reflux disease)     .  Depression with anxiety     .  Diabetes mellitus         Type II   .  Bronchitis     .  Bulging disc         in neck   .  Pneumonia     .  Overactive bladder         Past Surgical History   Procedure  Laterality  Date   .  Aortogram w/ pta    05/14/08,  11-04-10       Bilateral aortogram w/ bilateral  SFA PTA  stenting    .  Hand surgery  Left  2010   .  Tonsillectomy       .  Dilation and curettage of uterus           X4   .  Bunionectomy           L foot in the 1980s   .  Colonoscopy           2014   .  Epidural shots in neck        .  Femoral-popliteal bypass graft  Left   10/21/2013       Procedure: LEFT FEMORAL-POPLITEAL ARTERY BYPASS WITH SAPHENOUS VEIN GRAFT , POPLITEAL ENDARTERECTOMY ,INTRAOPERATIVE ARTERIOGRAM, vein patch angioplasty to popliteal artery;  Surgeon: Elam Dutch, MD;  Location: [facility] OR;  Service: Vascular;  Laterality: Left;   .  Groin debridement  Left  10/28/2013       Procedure: left inner thigh DEBRIDEMENT;  Surgeon: Elam Dutch, MD;  Location: [place];  Service: Vascular;  Laterality: Left;   .  Abdominal aortagram  N/A  08/15/2013       Procedure: ABDOMINAL Maxcine Ham;  Surgeon: Elam Dutch, MD;  Location: [facility] CATH LAB;  Service: Cardiovascular;  Laterality: N/A;   .  Abdominal aortagram  N/A  06/19/2014       Procedure: ABDOMINAL Maxcine Ham;  Surgeon: Elam Dutch, MD;  Location: [facility] CATH LAB;  Service: Cardiovascular;  Laterality: N/A;  Social History Social History   Substance Use Topics   .  Smoking status:  Current Every Day Smoker -- 0.10 packs/day       Types:  Cigarettes   .  Smokeless tobacco:  Never Used         Comment: pt states that she is using the E cig only smokes 1-20 regular cigs per month   .  Alcohol Use:  0.0 - 0.6 oz/week       0-1 Glasses of wine per week         Comment: Occasional     Family History Family History   Problem  Relation  Age of Onset   .  Heart disease  Father     .  Heart attack  Father         MI at age 67   .  Hypertension  Mother     .  Alzheimer's disease  Mother     .  Hypertension  Sister     .  Diabetes  Brother     .  Hyperlipidemia  Brother     .  Hypertension  Brother     .  Heart disease  Brother     .  Heart attack  Brother       Allergies  No Known Allergies     Current Outpatient Prescriptions   Medication  Sig  Dispense  Refill   .  amLODipine-olmesartan (AZOR) 5-40 MG per tablet  Take 1 tablet by mouth daily.       Marland Kitchen  aspirin EC 81 MG tablet  Take 81 mg by mouth daily.       Marland Kitchen  atenolol (TENORMIN) 50 MG tablet  Take 50 mg by mouth daily.       .   naproxen sodium (ALEVE) 220 MG tablet  Take 220 mg by mouth 2 (two) times daily as needed (for pain).       .  NON FORMULARY  Take 2 tablets by mouth daily. Hair Infinity       .  pantoprazole (PROTONIX) 20 MG tablet  Take 2 tablets (40 mg total) by mouth 2 (two) times daily.  120 tablet  0   .  rosuvastatin (CRESTOR) 5 MG tablet  Take 2.5 mg by mouth daily.       .  sitaGLIPtan-metformin (JANUMET) 50-1000 MG per tablet  Take 1 tablet by mouth 2 (two) times daily with a meal.       .  sucralfate (CARAFATE) 1 GM/10ML suspension  Take 10 mLs (1 g total) by mouth 4 (four) times daily -  with meals and at bedtime.  420 mL  0   .  temazepam (RESTORIL) 30 MG capsule  Take 30 mg by mouth at bedtime.       .  TRAVATAN Z 0.004 % SOLN ophthalmic solution  Place 1 drop into both eyes at bedtime.        .  bismuth subsalicylate (PEPTO BISMOL) 262 MG/15ML suspension  Take 30 mLs by mouth every 6 (six) hours as needed for indigestion or diarrhea or loose stools. Reported on 11/04/2015       .  HYDROcodone-acetaminophen (NORCO/VICODIN) 5-325 MG per tablet  Take 1 tablet by mouth every 4 (four) hours as needed for moderate pain. (Patient not taking: Reported on 11/04/2015)  20 tablet  0   .  PROAIR HFA 108 (90 BASE) MCG/ACT inhaler  Inhale 2 puffs into the lungs  every 4 (four) hours as needed for wheezing or shortness of breath. Reported on 11/04/2015          No current facility-administered medications for this visit.     ROS:    General:  No weight loss, Fever, chills  HEENT: No recent headaches, no nasal bleeding, no visual changes, no sore throat  Neurologic: No dizziness, blackouts, seizures. No recent symptoms of stroke or mini- stroke. No recent episodes of slurred speech, or temporary blindness.  Cardiac: No recent episodes of chest pain/pressure, no shortness of breath at rest.  No shortness of breath with exertion.  Denies history of atrial fibrillation or irregular heartbeat  Vascular: No  history of rest pain in feet.  No history of claudication.  No history of non-healing ulcer, No history of DVT    Pulmonary: No home oxygen, no productive cough, no hemoptysis,  No asthma or wheezing  Musculoskeletal:  [ ]  Arthritis, [ ]  Low back pain,  [ ]  Joint pain  Hematologic:No history of hypercoagulable state.  No history of easy bleeding.  No history of anemia  Gastrointestinal: No hematochezia or melena,  No gastroesophageal reflux, no trouble swallowing  Urinary: [ ]  chronic Kidney disease, [ ]  on HD - [ ]  MWF or [ ]  TTHS, [ ]  Burning with urination, [ ]  Frequent urination, [ ]  Difficulty urinating;    Skin: No rashes  Psychological: No history of anxiety,  No history of depression   Physical Examination    Filed Vitals:   01/20/16 0938  BP: 140/86  Pulse: 56  Height: 5\' 3"  (1.6 m)  Weight: 148 lb (67.132 kg)  SpO2: 95%    General:  Alert and oriented, no acute distress HEENT: Normal Neck: No bruit or JVD Pulmonary: Clear to auscultation bilaterally Cardiac: Regular Rate and Rhythm without murmur Abdomen: Soft, non-tender, non-distended, no mass, no scars Skin: No rash Extremity Pulses:  2+ radial, brachial, femoral,  2+ left dorsalis pedis absent right dorsalis pedis  and posterior tibial pulse Musculoskeletal: No deformity or edema     Neurologic: Upper and lower extremity motor 5/5 and symmetric  DATA:   Patient's last ABIs were reviewed from August 2016. Greater than 1 left 0.7 right   ASSESSMENT:   Short distance lifestyle limiting claudication right lower extremity. At this point patient wishes to proceed with revascularization.   PLAN:   right femoral to above-knee popliteal bypass on 02/01/2016.  Risks benefits possible complications and procedure details were discussed with the patient today including but not limited to bleeding infection graft thrombosis return of claudication symptoms limited durability without smoking cessation. She understands and  agrees to proceed.  Ruta Hinds, MD [facility] [place] Office: [phone] Pager: [phone]

## 2016-01-24 ENCOUNTER — Other Ambulatory Visit (HOSPITAL_COMMUNITY): Payer: Commercial Managed Care - HMO

## 2016-02-03 ENCOUNTER — Encounter (HOSPITAL_COMMUNITY): Payer: Self-pay

## 2016-02-03 ENCOUNTER — Encounter (HOSPITAL_COMMUNITY)
Admission: RE | Admit: 2016-02-03 | Discharge: 2016-02-03 | Disposition: A | Discharge status: Home / Self Care | Payer: Medicare Other | Source: Ambulatory Visit | Attending: Vascular Surgery | Admitting: Vascular Surgery

## 2016-02-03 DIAGNOSIS — Z7982 Long term (current) use of aspirin: Secondary | ICD-10-CM | POA: Diagnosis not present

## 2016-02-03 DIAGNOSIS — K219 Gastro-esophageal reflux disease without esophagitis: Secondary | ICD-10-CM | POA: Diagnosis not present

## 2016-02-03 DIAGNOSIS — Z0183 Encounter for blood typing: Secondary | ICD-10-CM | POA: Insufficient documentation

## 2016-02-03 DIAGNOSIS — M797 Fibromyalgia: Secondary | ICD-10-CM | POA: Diagnosis not present

## 2016-02-03 DIAGNOSIS — Z01812 Encounter for preprocedural laboratory examination: Secondary | ICD-10-CM | POA: Diagnosis not present

## 2016-02-03 DIAGNOSIS — Z95828 Presence of other vascular implants and grafts: Secondary | ICD-10-CM | POA: Insufficient documentation

## 2016-02-03 DIAGNOSIS — I10 Essential (primary) hypertension: Secondary | ICD-10-CM | POA: Insufficient documentation

## 2016-02-03 DIAGNOSIS — Z7984 Long term (current) use of oral hypoglycemic drugs: Secondary | ICD-10-CM | POA: Diagnosis not present

## 2016-02-03 DIAGNOSIS — Z79899 Other long term (current) drug therapy: Secondary | ICD-10-CM | POA: Insufficient documentation

## 2016-02-03 DIAGNOSIS — E785 Hyperlipidemia, unspecified: Secondary | ICD-10-CM | POA: Insufficient documentation

## 2016-02-03 DIAGNOSIS — E119 Type 2 diabetes mellitus without complications: Secondary | ICD-10-CM | POA: Diagnosis not present

## 2016-02-03 DIAGNOSIS — F17210 Nicotine dependence, cigarettes, uncomplicated: Secondary | ICD-10-CM | POA: Diagnosis not present

## 2016-02-03 DIAGNOSIS — I739 Peripheral vascular disease, unspecified: Secondary | ICD-10-CM | POA: Diagnosis not present

## 2016-02-03 HISTORY — DX: Unspecified glaucoma: H40.9

## 2016-02-03 HISTORY — DX: Frequency of micturition: R35.0

## 2016-02-03 HISTORY — DX: Sleep apnea, unspecified: G47.30

## 2016-02-03 HISTORY — DX: Personal history of other diseases of the respiratory system: Z87.09

## 2016-02-03 LAB — COMPREHENSIVE METABOLIC PANEL
ALT: 17 U/L (ref 14–54)
AST: 23 U/L (ref 15–41)
Albumin: 3.8 g/dL (ref 3.5–5.0)
Alkaline Phosphatase: 48 U/L (ref 38–126)
Anion gap: 11 (ref 5–15)
BUN: 12 mg/dL (ref 6–20)
CO2: 26 mmol/L (ref 22–32)
Calcium: 10 mg/dL (ref 8.9–10.3)
Chloride: 105 mmol/L (ref 101–111)
Creatinine, Ser: 0.82 mg/dL (ref 0.44–1.00)
GFR calc Af Amer: >60 mL/min (ref >60)
GFR calc non Af Amer: >60 mL/min (ref >60)
Glucose, Bld: 167 mg/dL — ABNORMAL HIGH (ref 65–99)
Potassium: 3.6 mmol/L (ref 3.5–5.1)
Sodium: 142 mmol/L (ref 135–145)
Total Bilirubin: 0.2 mg/dL — ABNORMAL LOW (ref 0.3–1.2)
Total Protein: 6.8 g/dL (ref 6.5–8.1)

## 2016-02-03 LAB — GLUCOSE, CAPILLARY: Glucose-Capillary: 212 mg/dL — ABNORMAL HIGH (ref 65–99)

## 2016-02-03 LAB — PROTIME-INR
INR: 0.97 (ref 0.00–1.49)
Prothrombin Time: 13.1 seconds (ref 11.6–15.2)

## 2016-02-03 LAB — URINALYSIS, ROUTINE W REFLEX MICROSCOPIC
Bilirubin Urine: NEGATIVE
Glucose, UA: NEGATIVE mg/dL
Hgb urine dipstick: NEGATIVE
Ketones, ur: NEGATIVE mg/dL
Leukocytes, UA: NEGATIVE
Nitrite: NEGATIVE
Protein, ur: NEGATIVE mg/dL
Specific Gravity, Urine: 1.023 (ref 1.005–1.030)
pH: 5 (ref 5.0–8.0)

## 2016-02-03 LAB — TYPE AND SCREEN
ABO/RH(D): A POS
Antibody Screen: NEGATIVE

## 2016-02-03 LAB — SURGICAL PCR SCREEN
MRSA, PCR: NEGATIVE
Staphylococcus aureus: POSITIVE — AB

## 2016-02-03 LAB — CBC
HCT: 37.6 % (ref 36.0–46.0)
Hemoglobin: 12.1 g/dL (ref 12.0–15.0)
MCH: 29.7 pg (ref 26.0–34.0)
MCHC: 32.2 g/dL (ref 30.0–36.0)
MCV: 92.4 fL (ref 78.0–100.0)
Platelets: 270 10*3/uL (ref 150–400)
RBC: 4.07 MIL/uL (ref 3.87–5.11)
RDW: 13.9 % (ref 11.5–15.5)
WBC: 5.2 10*3/uL (ref 4.0–10.5)

## 2016-02-03 LAB — APTT: aPTT: 25 seconds (ref 24–37)

## 2016-02-03 NOTE — Progress Notes (Addendum)
PCP - Dr. Carol Ada Cardiologist - Dr. Stanford Breed  EKG - 10/02/15 CXR - denies  Echo-denies Stress test- 2017 Cardiac Cath - denies  Patient denies chest pain and shortness of breath at PAT appointment.    Patient states that she checks her blood sugar once daily and that her fasting glucose is usually low 100's.  Patient states that her blood sugar is high at her PAT appointment due to eating a bagel prior to arrival.    Patient states that she was positive for sleep apnea approximately 10 years ago but she has not had a follow-up sleep study and she does not wear a CPAP at night.

## 2016-02-03 NOTE — Pre-Procedure Instructions (Signed)
Jill Higgins  02/03/2016      [id number] [place] [place] [address] [place] [address] Phone: [phone] Fax: [phone]    Your procedure is scheduled on Tuesday, March 21st, 2017.  Report to [facility] Admitting at 5:30 A.M.   Call this number if you have problems the morning of surgery:  [phone]   Remember:  Do not eat food or drink liquids after midnight.   Take these medicines the morning of surgery with A SIP OF WATER: Aspirin, Atenolol (Tenormin), Pantoprazole (Protonix), Proair Inhaler (please bring with you).  What do I do about my diabetes medications?   Do not take oral diabetes medicines (pills) the morning of surgery. Do NOT take Metformin the morning of surgery.   Stop taking: Aleve, Naproxen, Ibuprofen, Advil, Motrin, BC's, Goody's, Fish oil, all herbal medications, and all vitamins.    Do not wear jewelry, make-up or nail polish.  Do not wear lotions, powders, or perfumes.    Do not shave 48 hours prior to surgery.   Do not bring valuables to the hospital.   [facility] is not responsible for any belongings or valuables.  Contacts, dentures or bridgework may not be worn into surgery.  Leave your suitcase in the car.  After surgery it may be brought to your room.  For patients admitted to the hospital, discharge time will be determined by your treatment team.  Patients discharged the day of surgery will not be allowed to drive home.   Special instructions:  See attached.   Please read over the following fact sheets that you were given. Pain Booklet, Coughing and Deep Breathing, Blood Transfusion Information, MRSA Information and Surgical Site Infection Prevention    How to Manage Your Diabetes Before Surgery   Why is it important to control my blood sugar before and after surgery?   Improving blood sugar levels before and after surgery helps healing and can limit problems.  A way of  improving blood sugar control is eating a healthy diet by:  - Eating less sugar and carbohydrates  - Increasing activity/exercise  - Talk with your doctor about reaching your blood sugar goals  High blood sugars (greater than 180 mg/dL) can raise your risk of infections and slow down your recovery so you will need to focus on controlling your diabetes during the weeks before surgery.  Make sure that the doctor who takes care of your diabetes knows about your planned surgery including the date and location.  How do I manage my blood sugars before surgery?   Check your blood sugar at least 4 times a day, 2 days before surgery to make sure that they are not too high or low.   Check your blood sugar the morning of your surgery when you wake up and every 2 hours until you get to the Short-Stay unit.  If your blood sugar is less than 70 mg/dL, you will need to treat for low blood sugar by:  Treat a low blood sugar (less than 70 mg/dL) with 1/2 cup of clear juice (cranberry or apple), 4 glucose tablets, OR glucose gel.  Recheck blood sugar in 15 minutes after treatment (to make sure it is greater than 70 mg/dL).  If blood sugar is not greater than 70 mg/dL on re-check, call [phone] for further instructions.   Report your blood sugar to the Short-Stay nurse when you get to Short-Stay.  References:  [facility], 2007 "How to Manage your Diabetes Before and After Surgery"

## 2016-02-03 NOTE — Progress Notes (Signed)
   02/03/16 1025  OBSTRUCTIVE SLEEP APNEA  Have you ever been diagnosed with sleep apnea through a sleep study? Yes  If yes, do you have and use a CPAP or BPAP machine every night? 0 (has a CPAP but rarely uses)  Do you snore loudly (loud enough to be heard through closed doors)?  1  Do you often feel tired, fatigued, or sleepy during the daytime (such as falling asleep during driving or talking to someone)? 0  Has anyone observed you stop breathing during your sleep? 1  Do you have, or are you being treated for high blood pressure? 1  BMI more than 35 kg/m2? 0  Age > 50 (1-yes) 1  Neck circumference greater than:Female 16 inches or larger, Female 17inches or larger? 0  Female Gender (Yes=1) 0  Obstructive Sleep Apnea Score 4

## 2016-02-03 NOTE — Progress Notes (Signed)
Patient notified of positive PCR result and verbalized understanding.  [facility] [address]

## 2016-02-04 DIAGNOSIS — M5412 Radiculopathy, cervical region: Secondary | ICD-10-CM | POA: Diagnosis not present

## 2016-02-04 LAB — HEMOGLOBIN A1C
Hgb A1c MFr Bld: 6.4 % — ABNORMAL HIGH (ref 4.8–5.6)
Mean Plasma Glucose: 137 mg/dL

## 2016-02-05 ENCOUNTER — Emergency Department (HOSPITAL_COMMUNITY)
Admission: EM | Admit: 2016-02-05 | Discharge: 2016-02-05 | Disposition: A | Discharge status: Home / Self Care | Payer: Medicare Other | Attending: Emergency Medicine | Admitting: Emergency Medicine

## 2016-02-05 ENCOUNTER — Encounter (HOSPITAL_COMMUNITY): Payer: Self-pay

## 2016-02-05 DIAGNOSIS — E119 Type 2 diabetes mellitus without complications: Secondary | ICD-10-CM | POA: Insufficient documentation

## 2016-02-05 DIAGNOSIS — I10 Essential (primary) hypertension: Secondary | ICD-10-CM | POA: Diagnosis not present

## 2016-02-05 DIAGNOSIS — Z791 Long term (current) use of non-steroidal anti-inflammatories (NSAID): Secondary | ICD-10-CM | POA: Insufficient documentation

## 2016-02-05 DIAGNOSIS — Z87448 Personal history of other diseases of urinary system: Secondary | ICD-10-CM | POA: Diagnosis not present

## 2016-02-05 DIAGNOSIS — Z8669 Personal history of other diseases of the nervous system and sense organs: Secondary | ICD-10-CM | POA: Diagnosis not present

## 2016-02-05 DIAGNOSIS — Z9889 Other specified postprocedural states: Secondary | ICD-10-CM | POA: Insufficient documentation

## 2016-02-05 DIAGNOSIS — M797 Fibromyalgia: Secondary | ICD-10-CM | POA: Insufficient documentation

## 2016-02-05 DIAGNOSIS — Z79899 Other long term (current) drug therapy: Secondary | ICD-10-CM | POA: Insufficient documentation

## 2016-02-05 DIAGNOSIS — Z8659 Personal history of other mental and behavioral disorders: Secondary | ICD-10-CM | POA: Diagnosis not present

## 2016-02-05 DIAGNOSIS — M542 Cervicalgia: Secondary | ICD-10-CM | POA: Diagnosis present

## 2016-02-05 DIAGNOSIS — Z91040 Latex allergy status: Secondary | ICD-10-CM | POA: Insufficient documentation

## 2016-02-05 DIAGNOSIS — M5412 Radiculopathy, cervical region: Secondary | ICD-10-CM | POA: Insufficient documentation

## 2016-02-05 DIAGNOSIS — Z8709 Personal history of other diseases of the respiratory system: Secondary | ICD-10-CM | POA: Diagnosis not present

## 2016-02-05 DIAGNOSIS — Z8701 Personal history of pneumonia (recurrent): Secondary | ICD-10-CM | POA: Diagnosis not present

## 2016-02-05 DIAGNOSIS — K219 Gastro-esophageal reflux disease without esophagitis: Secondary | ICD-10-CM | POA: Insufficient documentation

## 2016-02-05 DIAGNOSIS — H409 Unspecified glaucoma: Secondary | ICD-10-CM | POA: Insufficient documentation

## 2016-02-05 DIAGNOSIS — Z87891 Personal history of nicotine dependence: Secondary | ICD-10-CM | POA: Insufficient documentation

## 2016-02-05 DIAGNOSIS — E785 Hyperlipidemia, unspecified: Secondary | ICD-10-CM | POA: Insufficient documentation

## 2016-02-05 DIAGNOSIS — Z7982 Long term (current) use of aspirin: Secondary | ICD-10-CM | POA: Diagnosis not present

## 2016-02-05 DIAGNOSIS — Z7984 Long term (current) use of oral hypoglycemic drugs: Secondary | ICD-10-CM | POA: Diagnosis not present

## 2016-02-05 MED ORDER — OXYCODONE-ACETAMINOPHEN 5-325 MG PO TABS: 1.0000 | ORAL_TABLET | Freq: Three times a day (TID) | ORAL | Status: DC | PRN

## 2016-02-05 NOTE — ED Notes (Signed)
Pt left with all his belongings and ambulated out of the treatment area.  

## 2016-02-05 NOTE — ED Notes (Signed)
Pt reports right arm pain that began Tuesday. Pain originates at neck and radiates down right arm to her hand. She states she went to [facility] clinic yesterday and was prescribed Meloxicam and given "a shot in my butt" but does not recall what medication it was but states it did not help her pain.

## 2016-02-05 NOTE — ED Provider Notes (Signed)
CSN: [id number]     Arrival date & time 02/05/16  0123 History   First MD Initiated Contact with Patient 02/05/16 [phone]     Chief Complaint  Patient presents with  . Arm Pain    Patient is a 70 y.o. female presenting with arm pain. The history is provided by the patient.  Arm Pain This is a new problem. The current episode started more than 2 days ago. The problem occurs daily. The problem has been gradually worsening. Pertinent negatives include no chest pain and no shortness of breath. Exacerbated by: movement. The symptoms are relieved by rest.   Pt  With h/o peripheral vascular disease reports right neck pain that radiates into right shoulder/arm for past 4 days No trauma No fever No vomiting No cp/sob No HA No weakness/numbness in arms/legs She has had this before and MRI showed problems with her spine and she required an "injection in the neck" previously   Seen earlier in the day at [facility] clinic at PCP and given "shot" without relief Past Medical History  Diagnosis Date  . Tobacco abuse   . Hypertension   . Hyperlipidemia   . Peripheral vascular disease ([place])   . GERD (gastroesophageal reflux disease)   . Depression with anxiety   . Diabetes mellitus     Type II  . Bronchitis   . Bulging disc     in neck  . Pneumonia   . Overactive bladder   . Fibromyalgia     pt. denies  . Sleep apnea   . History of bronchitis   . Urinary frequency   . Glaucoma    Past Surgical History  Procedure Laterality Date  . Aortogram w/ pta  05/14/08,  11-04-10    Bilateral aortogram w/ bilateral  SFA PTA  stenting   . Hand surgery Left 2010  . Tonsillectomy    . Dilation and curettage of uterus      X4  . Bunionectomy      L foot in the 1980s  . Colonoscopy      2014  . Epidural shots in neck     . Femoral-popliteal bypass graft Left 10/21/2013    Procedure: LEFT FEMORAL-POPLITEAL ARTERY BYPASS WITH SAPHENOUS VEIN GRAFT , POPLITEAL ENDARTERECTOMY ,INTRAOPERATIVE ARTERIOGRAM,  vein patch angioplasty to popliteal artery;  Surgeon: Elam Dutch, MD;  Location: [facility] OR;  Service: Vascular;  Laterality: Left;  . Groin debridement Left 10/28/2013    Procedure: left inner thigh DEBRIDEMENT;  Surgeon: Elam Dutch, MD;  Location: [place];  Service: Vascular;  Laterality: Left;  . Abdominal aortagram N/A 08/15/2013    Procedure: ABDOMINAL Maxcine Ham;  Surgeon: Elam Dutch, MD;  Location: [facility] CATH LAB;  Service: Cardiovascular;  Laterality: N/A;  . Abdominal aortagram N/A 06/19/2014    Procedure: ABDOMINAL Maxcine Ham;  Surgeon: Elam Dutch, MD;  Location: [facility] CATH LAB;  Service: Cardiovascular;  Laterality: N/A;  . Peripheral vascular catheterization N/A 12/24/2015    Procedure: Abdominal Aortogram;  Surgeon: Elam Dutch, MD;  Location: [facility] CV LAB;  Service: Cardiovascular;  Laterality: N/A;  . Lower extremity angiogram Bilateral 12/24/2015    Procedure: Lower Extremity Angiogram;  Surgeon: Elam Dutch, MD;  Location: [place] CV LAB;  Service: Cardiovascular;  Laterality: Bilateral;  . Peripheral vascular catheterization Left 12/24/2015    Procedure: Peripheral Vascular Balloon Angioplasty;  Surgeon: Elam Dutch, MD;  Location: [place] CV LAB;  Service: Cardiovascular;  Laterality: Left;  drug coated  balloon  . Breast surgery Right     boil removal  . Eye surgery Bilateral 2016    cataract removal   Family History  Problem Relation Age of Onset  . Heart disease Father   . Heart attack Father     MI at age 66  . Hypertension Mother   . Alzheimer's disease Mother   . Hypertension Sister   . Diabetes Brother   . Hyperlipidemia Brother   . Hypertension Brother   . Heart disease Brother   . Heart attack Brother    Social History  Substance Use Topics  . Smoking status: Former Smoker -- 0.10 packs/day    Types: Cigarettes    Quit date: 12/21/2014  . Smokeless tobacco: Never Used     Comment: pt states that she is using the E cig only    . Alcohol Use: 0.0 - 0.6 oz/week    0-1 Glasses of wine per week     Comment: Occasional   OB History    No data available     Review of Systems  Constitutional: Negative for fever.  Respiratory: Negative for shortness of breath.   Cardiovascular: Negative for chest pain.  Musculoskeletal: Positive for arthralgias and neck pain.  Neurological: Negative for weakness and numbness.  All other systems reviewed and are negative.     Allergies  Latex and Adhesive  Home Medications   Prior to Admission medications   Medication Sig Start Date End Date Taking? Authorizing Provider  amLODipine-olmesartan (AZOR) 5-40 MG per tablet Take 1 tablet by mouth daily.   Yes Historical Provider, MD  aspirin EC 81 MG tablet Take 81 mg by mouth daily.   Yes Historical Provider, MD  atenolol (TENORMIN) 50 MG tablet Take 50 mg by mouth daily.   Yes Historical Provider, MD  bismuth subsalicylate (PEPTO BISMOL) 262 MG/15ML suspension Take 30 mLs by mouth every 6 (six) hours as needed for indigestion or diarrhea or loose stools. Reported on 11/04/2015   Yes Historical Provider, MD  CARAFATE 1 GM/10ML suspension Take 10 mLs by mouth 4 (four) times daily as needed. (ACID REFLUX) 09/20/15  Yes Historical Provider, MD  lovastatin (MEVACOR) 20 MG tablet Take 20 mg by mouth every morning.    Yes Historical Provider, MD  meloxicam (MOBIC) 15 MG tablet Take 15 mg by mouth daily.   Yes Historical Provider, MD  metFORMIN (GLUCOPHAGE) 1000 MG tablet Take 1,000 mg by mouth 2 (two) times daily with a meal.   Yes Historical Provider, MD  naproxen sodium (ALEVE) 220 MG tablet Take 220 mg by mouth 2 (two) times daily as needed (for pain).   Yes Historical Provider, MD  NON FORMULARY Take 2 tablets by mouth daily. Hair Infinity   Yes Historical Provider, MD  pantoprazole (PROTONIX) 40 MG tablet Take 40 mg by mouth daily. 10/25/15  Yes Historical Provider, MD  PROAIR HFA 108 (90 BASE) MCG/ACT inhaler Inhale 2 puffs into  the lungs every 4 (four) hours as needed for wheezing or shortness of breath. Reported on 11/04/2015 09/02/13  Yes Historical Provider, MD  temazepam (RESTORIL) 30 MG capsule Take 30 mg by mouth at bedtime.   Yes Historical Provider, MD  TRAVATAN Z 0.004 % SOLN ophthalmic solution Place 1 drop into both eyes at bedtime.  01/28/13  Yes Historical Provider, MD  oxyCODONE-acetaminophen (PERCOCET/ROXICET) 5-325 MG tablet Take 1 tablet by mouth every 8 (eight) hours as needed for severe pain. 02/05/16   Ripley Fraise, MD  BP 145/71 mmHg  Pulse 55  Temp(Src) 97.8 F (36.6 C) (Oral)  Resp 16  Ht 5\' 3"  (1.6 m)  Wt 67.132 kg  BMI 26.22 kg/m2  SpO2 97% Physical Exam CONSTITUTIONAL: Well developed/well nourished HEAD: Normocephalic/atraumatic EYES: EOMI ENMT: Mucous membranes moist NECK: supple no meningeal signs, no bruits SPINE/BACK:entire spine nontender, cervical paraspinal tenderness CV: S1/S2 noted, no murmurs/rubs/gallops noted LUNGS: Lungs are clear to auscultation bilaterally, no apparent distress ABDOMEN: soft, nontender NEURO: Pt is awake/alert/appropriate, moves all extremitiesx4.  No facial droop.  Equal power (5/5) with hand grip, wrist flex/extension, elbow flex/extension, and equal power with shoulder abduction/adduction.  No focal sensory deficit to light touch is noted in either UE.   Equal (2+) biceps/brachioradialis  reflex in bilateral UE EXTREMITIES: pulses normal/equalx4, full ROM, tenderness to palpation of right anterior shoulder but no erythema/edema noted SKIN: warm, color normal PSYCH: no abnormalities of mood noted, alert and oriented to situation  ED Course  Procedures MRI in 2013 revealed disc protrusions Pt reports this pain is similar to those episodes She has no weakness No signs of stroke No signs of vascular occlusion (schedule to have graft surgery to right LE next week by vascular) No CP to suggest ACS Will d/c home Short course of pain meds for  patient We discussed strict return precautions   MDM   Final diagnoses:  Cervical radiculopathy    Nursing notes including past medical history and social history reviewed and considered in documentation Previous records reviewed and considered     Ripley Fraise, MD 02/05/16 [phone]

## 2016-02-05 NOTE — Discharge Instructions (Signed)

## 2016-02-07 MED ORDER — DEXTROSE 5 % IV SOLN
1.5000 g | INTRAVENOUS | Status: AC
  Administered 2016-02-08: 1.5 g via INTRAVENOUS
  Filled 2016-02-07: qty 1.5

## 2016-02-07 MED ORDER — SODIUM CHLORIDE 0.9 % IV SOLN: INTRAVENOUS | Status: DC

## 2016-02-07 NOTE — Anesthesia Preprocedure Evaluation (Addendum)
Anesthesia Evaluation  Patient identified by MRN, date of birth, ID band Patient awake    Reviewed: Allergy & Precautions, NPO status , Patient's Chart, lab work & pertinent test results  History of Anesthesia Complications Negative for: history of anesthetic complications  Airway Mallampati: II  TM Distance: >3 FB Neck ROM: Full    Dental  (+) Missing, Chipped, Dental Advisory Given   Pulmonary sleep apnea (does not use CPAP) , former smoker,    breath sounds clear to auscultation       Cardiovascular hypertension, Pt. on medications and Pt. on home beta blockers (-) angina+ Peripheral Vascular Disease   Rhythm:Regular Rate:Normal  1/17 Stress: EF 58%, normal perfusion   Neuro/Psych negative neurological ROS     GI/Hepatic Neg liver ROS, GERD  Medicated and Controlled,  Endo/Other  diabetes (glu 124), Type 2, Oral Hypoglycemic Agents  Renal/GU negative Renal ROS     Musculoskeletal   Abdominal   Peds  Hematology negative hematology ROS (+)   Anesthesia Other Findings   Reproductive/Obstetrics                           Anesthesia Physical Anesthesia Plan  ASA: III  Anesthesia Plan: General   Post-op Pain Management:    Induction: Intravenous  Airway Management Planned: Oral ETT  Additional Equipment:   Intra-op Plan:   Post-operative Plan: Extubation in OR  Informed Consent: I have reviewed the patients History and Physical, chart, labs and discussed the procedure including the risks, benefits and alternatives for the proposed anesthesia with the patient or authorized representative who has indicated his/her understanding and acceptance.   Dental advisory given  Plan Discussed with: CRNA and Surgeon  Anesthesia Plan Comments: (Plan routine monitors, GETA)        Anesthesia Quick Evaluation

## 2016-02-08 ENCOUNTER — Inpatient Hospital Stay (HOSPITAL_COMMUNITY): Payer: Medicare HMO | Admitting: Anesthesiology

## 2016-02-08 ENCOUNTER — Encounter (HOSPITAL_COMMUNITY): Payer: Self-pay | Admitting: *Deleted

## 2016-02-08 ENCOUNTER — Encounter (HOSPITAL_COMMUNITY)
Admission: RE | Disposition: A | Discharge status: Home Health | Payer: Self-pay | Source: Ambulatory Visit | Attending: Vascular Surgery

## 2016-02-08 ENCOUNTER — Inpatient Hospital Stay (HOSPITAL_COMMUNITY)
Admission: RE | Admit: 2016-02-08 | Discharge: 2016-02-12 | DRG: 253 | Disposition: A | Discharge status: Home Health | Payer: Medicare HMO | Source: Ambulatory Visit | Attending: Vascular Surgery | Admitting: Vascular Surgery

## 2016-02-08 DIAGNOSIS — R202 Paresthesia of skin: Secondary | ICD-10-CM

## 2016-02-08 DIAGNOSIS — Z95828 Presence of other vascular implants and grafts: Secondary | ICD-10-CM

## 2016-02-08 DIAGNOSIS — D62 Acute posthemorrhagic anemia: Secondary | ICD-10-CM | POA: Diagnosis not present

## 2016-02-08 DIAGNOSIS — I70211 Atherosclerosis of native arteries of extremities with intermittent claudication, right leg: Secondary | ICD-10-CM | POA: Diagnosis not present

## 2016-02-08 DIAGNOSIS — H409 Unspecified glaucoma: Secondary | ICD-10-CM | POA: Diagnosis present

## 2016-02-08 DIAGNOSIS — Z8249 Family history of ischemic heart disease and other diseases of the circulatory system: Secondary | ICD-10-CM

## 2016-02-08 DIAGNOSIS — Z7984 Long term (current) use of oral hypoglycemic drugs: Secondary | ICD-10-CM

## 2016-02-08 DIAGNOSIS — M797 Fibromyalgia: Secondary | ICD-10-CM | POA: Diagnosis present

## 2016-02-08 DIAGNOSIS — F418 Other specified anxiety disorders: Secondary | ICD-10-CM | POA: Diagnosis present

## 2016-02-08 DIAGNOSIS — Z7982 Long term (current) use of aspirin: Secondary | ICD-10-CM

## 2016-02-08 DIAGNOSIS — I10 Essential (primary) hypertension: Secondary | ICD-10-CM | POA: Diagnosis present

## 2016-02-08 DIAGNOSIS — M479 Spondylosis, unspecified: Secondary | ICD-10-CM | POA: Diagnosis present

## 2016-02-08 DIAGNOSIS — I739 Peripheral vascular disease, unspecified: Secondary | ICD-10-CM | POA: Diagnosis present

## 2016-02-08 DIAGNOSIS — R200 Anesthesia of skin: Secondary | ICD-10-CM

## 2016-02-08 DIAGNOSIS — G4730 Sleep apnea, unspecified: Secondary | ICD-10-CM | POA: Diagnosis present

## 2016-02-08 DIAGNOSIS — E785 Hyperlipidemia, unspecified: Secondary | ICD-10-CM | POA: Diagnosis present

## 2016-02-08 DIAGNOSIS — R001 Bradycardia, unspecified: Secondary | ICD-10-CM | POA: Diagnosis not present

## 2016-02-08 DIAGNOSIS — E1151 Type 2 diabetes mellitus with diabetic peripheral angiopathy without gangrene: Principal | ICD-10-CM | POA: Diagnosis present

## 2016-02-08 DIAGNOSIS — Z23 Encounter for immunization: Secondary | ICD-10-CM | POA: Diagnosis not present

## 2016-02-08 DIAGNOSIS — F17210 Nicotine dependence, cigarettes, uncomplicated: Secondary | ICD-10-CM | POA: Diagnosis present

## 2016-02-08 DIAGNOSIS — K219 Gastro-esophageal reflux disease without esophagitis: Secondary | ICD-10-CM | POA: Diagnosis not present

## 2016-02-08 HISTORY — PX: FEMORAL-POPLITEAL BYPASS GRAFT: SHX937

## 2016-02-08 LAB — GLUCOSE, CAPILLARY
Glucose-Capillary: 124 mg/dL — ABNORMAL HIGH (ref 65–99)
Glucose-Capillary: 164 mg/dL — ABNORMAL HIGH (ref 65–99)
Glucose-Capillary: 116 mg/dL — ABNORMAL HIGH (ref 65–99)
Glucose-Capillary: 155 mg/dL — ABNORMAL HIGH (ref 65–99)

## 2016-02-08 LAB — CBC
HCT: 33.3 % — ABNORMAL LOW (ref 36.0–46.0)
Hemoglobin: 10.6 g/dL — ABNORMAL LOW (ref 12.0–15.0)
MCH: 29.2 pg (ref 26.0–34.0)
MCHC: 31.8 g/dL (ref 30.0–36.0)
MCV: 91.7 fL (ref 78.0–100.0)
Platelets: 248 10*3/uL (ref 150–400)
RBC: 3.63 MIL/uL — ABNORMAL LOW (ref 3.87–5.11)
RDW: 13.9 % (ref 11.5–15.5)
WBC: 8.8 10*3/uL (ref 4.0–10.5)

## 2016-02-08 LAB — CREATININE, SERUM
Creatinine, Ser: 0.76 mg/dL (ref 0.44–1.00)
GFR calc Af Amer: >60 mL/min (ref >60)
GFR calc non Af Amer: >60 mL/min (ref >60)

## 2016-02-08 SURGERY — BYPASS GRAFT FEMORAL-POPLITEAL ARTERY
Anesthesia: General | Site: Leg Upper | Laterality: Right

## 2016-02-08 MED ORDER — MIDAZOLAM HCL 5 MG/5ML IJ SOLN
INTRAMUSCULAR | Status: DC | PRN
  Administered 2016-02-08: 2 mg via INTRAVENOUS

## 2016-02-08 MED ORDER — HYDROMORPHONE HCL 1 MG/ML IJ SOLN
0.2500 mg | INTRAMUSCULAR | Status: DC | PRN
  Administered 2016-02-08 (×4): 0.5 mg via INTRAVENOUS

## 2016-02-08 MED ORDER — FENTANYL CITRATE (PF) 250 MCG/5ML IJ SOLN
INTRAMUSCULAR | Status: DC | PRN
  Administered 2016-02-08 (×5): 50 ug via INTRAVENOUS
  Administered 2016-02-08: 250 ug via INTRAVENOUS

## 2016-02-08 MED ORDER — PHENOL 1.4 % MT LIQD: 1.0000 | OROMUCOSAL | Status: DC | PRN

## 2016-02-08 MED ORDER — LATANOPROST 0.005 % OP SOLN
1.0000 [drp] | Freq: Every day | OPHTHALMIC | Status: DC
  Administered 2016-02-08 – 2016-02-11 (×4): 1 [drp] via OPHTHALMIC
  Filled 2016-02-08 (×2): qty 2.5

## 2016-02-08 MED ORDER — ONDANSETRON HCL 4 MG/2ML IJ SOLN
INTRAMUSCULAR | Status: AC
  Filled 2016-02-08: qty 2

## 2016-02-08 MED ORDER — ONDANSETRON HCL 4 MG/2ML IJ SOLN
INTRAMUSCULAR | Status: DC | PRN
  Administered 2016-02-08: 4 mg via INTRAVENOUS

## 2016-02-08 MED ORDER — ASPIRIN EC 81 MG PO TBEC
81.0000 mg | DELAYED_RELEASE_TABLET | Freq: Every day | ORAL | Status: DC
  Administered 2016-02-09 – 2016-02-12 (×4): 81 mg via ORAL
  Filled 2016-02-08 (×4): qty 1

## 2016-02-08 MED ORDER — TEMAZEPAM 15 MG PO CAPS
30.0000 mg | ORAL_CAPSULE | Freq: Every day | ORAL | Status: DC
  Administered 2016-02-09 – 2016-02-10 (×2): 30 mg via ORAL
  Filled 2016-02-08 (×3): qty 2

## 2016-02-08 MED ORDER — ALBUTEROL SULFATE HFA 108 (90 BASE) MCG/ACT IN AERS: 2.0000 | INHALATION_SPRAY | RESPIRATORY_TRACT | Status: DC | PRN

## 2016-02-08 MED ORDER — HYDROMORPHONE HCL 1 MG/ML IJ SOLN
INTRAMUSCULAR | Status: AC
  Filled 2016-02-08: qty 1

## 2016-02-08 MED ORDER — DEXMEDETOMIDINE HCL IN NACL 200 MCG/50ML IV SOLN
INTRAVENOUS | Status: AC
  Filled 2016-02-08: qty 50

## 2016-02-08 MED ORDER — ACETAMINOPHEN 650 MG RE SUPP: 325.0000 mg | RECTAL | Status: DC | PRN

## 2016-02-08 MED ORDER — POTASSIUM CHLORIDE CRYS ER 20 MEQ PO TBCR: 20.0000 meq | EXTENDED_RELEASE_TABLET | Freq: Every day | ORAL | Status: DC | PRN

## 2016-02-08 MED ORDER — EPHEDRINE SULFATE 50 MG/ML IJ SOLN
INTRAMUSCULAR | Status: AC
  Filled 2016-02-08: qty 1

## 2016-02-08 MED ORDER — HYDRALAZINE HCL 20 MG/ML IJ SOLN: 5.0000 mg | INTRAMUSCULAR | Status: DC | PRN

## 2016-02-08 MED ORDER — AMLODIPINE-OLMESARTAN 5-40 MG PO TABS: 1.0000 | ORAL_TABLET | Freq: Every day | ORAL | Status: DC

## 2016-02-08 MED ORDER — BISACODYL 10 MG RE SUPP: 10.0000 mg | Freq: Every day | RECTAL | Status: DC | PRN

## 2016-02-08 MED ORDER — SODIUM CHLORIDE 0.9 % IV SOLN
INTRAVENOUS | Status: DC | PRN
  Administered 2016-02-08: 500 mL

## 2016-02-08 MED ORDER — ALUM & MAG HYDROXIDE-SIMETH 200-200-20 MG/5ML PO SUSP: 15.0000 mL | ORAL | Status: DC | PRN

## 2016-02-08 MED ORDER — ROCURONIUM BROMIDE 100 MG/10ML IV SOLN
INTRAVENOUS | Status: DC | PRN
  Administered 2016-02-08: 50 mg via INTRAVENOUS

## 2016-02-08 MED ORDER — LIDOCAINE HCL (CARDIAC) 20 MG/ML IV SOLN
INTRAVENOUS | Status: AC
  Filled 2016-02-08: qty 5

## 2016-02-08 MED ORDER — ALBUTEROL SULFATE (2.5 MG/3ML) 0.083% IN NEBU
2.5000 mg | INHALATION_SOLUTION | RESPIRATORY_TRACT | Status: DC | PRN
Start: 2016-02-08 — End: 2016-02-12

## 2016-02-08 MED ORDER — PROMETHAZINE HCL 25 MG/ML IJ SOLN: 6.2500 mg | INTRAMUSCULAR | Status: DC | PRN

## 2016-02-08 MED ORDER — GUAIFENESIN-DM 100-10 MG/5ML PO SYRP: 15.0000 mL | ORAL_SOLUTION | ORAL | Status: DC | PRN

## 2016-02-08 MED ORDER — SODIUM CHLORIDE 0.9 % IV SOLN: 500.0000 mL | Freq: Once | INTRAVENOUS | Status: DC | PRN

## 2016-02-08 MED ORDER — LIDOCAINE HCL (CARDIAC) 20 MG/ML IV SOLN
INTRAVENOUS | Status: DC | PRN
Start: 2016-02-08 — End: 2016-02-08
  Administered 2016-02-08: 20 mg via INTRAVENOUS

## 2016-02-08 MED ORDER — IRBESARTAN 300 MG PO TABS
300.0000 mg | ORAL_TABLET | Freq: Every day | ORAL | Status: DC
  Administered 2016-02-09 – 2016-02-12 (×4): 300 mg via ORAL
  Filled 2016-02-08 (×5): qty 1

## 2016-02-08 MED ORDER — ACETAMINOPHEN 325 MG PO TABS: 325.0000 mg | ORAL_TABLET | ORAL | Status: DC | PRN

## 2016-02-08 MED ORDER — SODIUM CHLORIDE 0.9 % IV SOLN
INTRAVENOUS | Status: DC
  Administered 2016-02-08: 75 mL/h via INTRAVENOUS

## 2016-02-08 MED ORDER — ENOXAPARIN SODIUM 40 MG/0.4ML ~~LOC~~ SOLN
40.0000 mg | SUBCUTANEOUS | Status: DC
  Administered 2016-02-09 – 2016-02-11 (×3): 40 mg via SUBCUTANEOUS
  Filled 2016-02-08 (×3): qty 0.4

## 2016-02-08 MED ORDER — MORPHINE SULFATE (PF) 2 MG/ML IV SOLN
2.0000 mg | INTRAVENOUS | Status: DC | PRN
  Administered 2016-02-08 – 2016-02-12 (×9): 2 mg via INTRAVENOUS
  Filled 2016-02-08 (×9): qty 1

## 2016-02-08 MED ORDER — PROPOFOL 10 MG/ML IV BOLUS
INTRAVENOUS | Status: AC
  Filled 2016-02-08: qty 20

## 2016-02-08 MED ORDER — INSULIN ASPART 100 UNIT/ML ~~LOC~~ SOLN
0.0000 [IU] | Freq: Three times a day (TID) | SUBCUTANEOUS | Status: DC
  Administered 2016-02-09 – 2016-02-10 (×4): 1 [IU] via SUBCUTANEOUS
  Administered 2016-02-11: 2 [IU] via SUBCUTANEOUS
  Administered 2016-02-11: 1 [IU] via SUBCUTANEOUS

## 2016-02-08 MED ORDER — 0.9 % SODIUM CHLORIDE (POUR BTL) OPTIME
TOPICAL | Status: DC | PRN
  Administered 2016-02-08: 2000 mL

## 2016-02-08 MED ORDER — ONDANSETRON HCL 4 MG/2ML IJ SOLN: 4.0000 mg | Freq: Four times a day (QID) | INTRAMUSCULAR | Status: DC | PRN

## 2016-02-08 MED ORDER — MIDAZOLAM HCL 2 MG/2ML IJ SOLN: 0.5000 mg | Freq: Once | INTRAMUSCULAR | Status: DC | PRN

## 2016-02-08 MED ORDER — MAGNESIUM HYDROXIDE 400 MG/5ML PO SUSP
30.0000 mL | Freq: Every day | ORAL | Status: DC | PRN
  Administered 2016-02-12: 30 mL via ORAL
  Filled 2016-02-08: qty 30

## 2016-02-08 MED ORDER — FENTANYL CITRATE (PF) 250 MCG/5ML IJ SOLN
INTRAMUSCULAR | Status: AC
  Filled 2016-02-08: qty 5

## 2016-02-08 MED ORDER — OXYCODONE-ACETAMINOPHEN 5-325 MG PO TABS: 1.0000 | ORAL_TABLET | Freq: Four times a day (QID) | ORAL | Status: DC | PRN

## 2016-02-08 MED ORDER — PROTAMINE SULFATE 10 MG/ML IV SOLN
INTRAVENOUS | Status: AC
  Filled 2016-02-08: qty 5

## 2016-02-08 MED ORDER — PROPOFOL 10 MG/ML IV BOLUS
INTRAVENOUS | Status: DC | PRN
  Administered 2016-02-08: 130 mg via INTRAVENOUS

## 2016-02-08 MED ORDER — CHLORHEXIDINE GLUCONATE CLOTH 2 % EX PADS: 6.0000 | MEDICATED_PAD | Freq: Once | CUTANEOUS | Status: DC

## 2016-02-08 MED ORDER — ROCURONIUM BROMIDE 50 MG/5ML IV SOLN
INTRAVENOUS | Status: AC
  Filled 2016-02-08: qty 1

## 2016-02-08 MED ORDER — DOCUSATE SODIUM 100 MG PO CAPS
100.0000 mg | ORAL_CAPSULE | Freq: Every day | ORAL | Status: DC
  Administered 2016-02-09 – 2016-02-12 (×4): 100 mg via ORAL
  Filled 2016-02-08 (×4): qty 1

## 2016-02-08 MED ORDER — SODIUM CHLORIDE 0.9 % IJ SOLN
INTRAMUSCULAR | Status: AC
  Filled 2016-02-08: qty 10

## 2016-02-08 MED ORDER — SUCCINYLCHOLINE CHLORIDE 20 MG/ML IJ SOLN
INTRAMUSCULAR | Status: AC
  Filled 2016-02-08: qty 1

## 2016-02-08 MED ORDER — HEPARIN SODIUM (PORCINE) 1000 UNIT/ML IJ SOLN
INTRAMUSCULAR | Status: AC
  Filled 2016-02-08: qty 1

## 2016-02-08 MED ORDER — MEPERIDINE HCL 25 MG/ML IJ SOLN: 6.2500 mg | INTRAMUSCULAR | Status: DC | PRN

## 2016-02-08 MED ORDER — PROTAMINE SULFATE 10 MG/ML IV SOLN
INTRAVENOUS | Status: DC | PRN
  Administered 2016-02-08: 10 mg via INTRAVENOUS
  Administered 2016-02-08 (×2): 20 mg via INTRAVENOUS

## 2016-02-08 MED ORDER — OXYCODONE-ACETAMINOPHEN 5-325 MG PO TABS
1.0000 | ORAL_TABLET | Freq: Four times a day (QID) | ORAL | Status: DC | PRN
  Administered 2016-02-08 – 2016-02-11 (×8): 2 via ORAL
  Administered 2016-02-11: 1 via ORAL
  Administered 2016-02-12: 2 via ORAL
  Filled 2016-02-08: qty 2
  Filled 2016-02-08: qty 1
  Filled 2016-02-08 (×11): qty 2

## 2016-02-08 MED ORDER — PANTOPRAZOLE SODIUM 40 MG PO TBEC
40.0000 mg | DELAYED_RELEASE_TABLET | Freq: Every day | ORAL | Status: DC
  Administered 2016-02-09 – 2016-02-12 (×4): 40 mg via ORAL
  Filled 2016-02-08 (×4): qty 1

## 2016-02-08 MED ORDER — PHENYLEPHRINE 40 MCG/ML (10ML) SYRINGE FOR IV PUSH (FOR BLOOD PRESSURE SUPPORT)
PREFILLED_SYRINGE | INTRAVENOUS | Status: AC
  Filled 2016-02-08: qty 10

## 2016-02-08 MED ORDER — METOPROLOL TARTRATE 1 MG/ML IV SOLN: 2.0000 mg | INTRAVENOUS | Status: DC | PRN

## 2016-02-08 MED ORDER — AMLODIPINE BESYLATE 5 MG PO TABS
5.0000 mg | ORAL_TABLET | Freq: Every day | ORAL | Status: DC
  Administered 2016-02-09 – 2016-02-11 (×2): 5 mg via ORAL
  Filled 2016-02-08 (×4): qty 1

## 2016-02-08 MED ORDER — HEPARIN SODIUM (PORCINE) 1000 UNIT/ML IJ SOLN
INTRAMUSCULAR | Status: DC | PRN
  Administered 2016-02-08: 3000 [IU] via INTRAVENOUS
  Administered 2016-02-08: 7000 [IU] via INTRAVENOUS

## 2016-02-08 MED ORDER — METFORMIN HCL 500 MG PO TABS
1000.0000 mg | ORAL_TABLET | Freq: Two times a day (BID) | ORAL | Status: DC
  Administered 2016-02-09 – 2016-02-12 (×7): 1000 mg via ORAL
  Filled 2016-02-08 (×7): qty 2

## 2016-02-08 MED ORDER — DEXTROSE 5 % IV SOLN
1.5000 g | Freq: Two times a day (BID) | INTRAVENOUS | Status: AC
  Administered 2016-02-08 – 2016-02-09 (×2): 1.5 g via INTRAVENOUS
  Filled 2016-02-08 (×2): qty 1.5

## 2016-02-08 MED ORDER — MIDAZOLAM HCL 2 MG/2ML IJ SOLN
INTRAMUSCULAR | Status: AC
  Filled 2016-02-08: qty 2

## 2016-02-08 MED ORDER — PRAVASTATIN SODIUM 20 MG PO TABS
20.0000 mg | ORAL_TABLET | Freq: Every day | ORAL | Status: DC
  Administered 2016-02-09 – 2016-02-11 (×3): 20 mg via ORAL
  Filled 2016-02-08 (×3): qty 1

## 2016-02-08 MED ORDER — LABETALOL HCL 5 MG/ML IV SOLN: 10.0000 mg | INTRAVENOUS | Status: DC | PRN

## 2016-02-08 MED ORDER — MORPHINE SULFATE (PF) 2 MG/ML IV SOLN
INTRAVENOUS | Status: AC
  Filled 2016-02-08: qty 1

## 2016-02-08 MED ORDER — LACTATED RINGERS IV SOLN
INTRAVENOUS | Status: DC | PRN
  Administered 2016-02-08 (×3): via INTRAVENOUS

## 2016-02-08 SURGICAL SUPPLY — 63 items
BANDAGE ESMARK 6X9 LF (GAUZE/BANDAGES/DRESSINGS) IMPLANT
BNDG CMPR 9X6 STRL LF SNTH (GAUZE/BANDAGES/DRESSINGS)
BNDG ESMARK 6X9 LF (GAUZE/BANDAGES/DRESSINGS)
CANISTER SUCTION 2500CC (MISCELLANEOUS) ×2 IMPLANT
CANNULA VESSEL 3MM 2 BLNT TIP (CANNULA) ×2 IMPLANT
CLIP TI MEDIUM 24 (CLIP) ×2 IMPLANT
CLIP TI WIDE RED SMALL 24 (CLIP) ×3 IMPLANT
CUFF TOURNIQUET SINGLE 24IN (TOURNIQUET CUFF) IMPLANT
CUFF TOURNIQUET SINGLE 34IN LL (TOURNIQUET CUFF) IMPLANT
CUFF TOURNIQUET SINGLE 44IN (TOURNIQUET CUFF) IMPLANT
DRAIN SNY WOU (WOUND CARE) IMPLANT
DRAPE PROXIMA HALF (DRAPES) IMPLANT
DRAPE X-RAY CASS 24X20 (DRAPES) IMPLANT
ELECT REM PT RETURN 9FT ADLT (ELECTROSURGICAL) ×2
ELECTRODE REM PT RTRN 9FT ADLT (ELECTROSURGICAL) ×1 IMPLANT
EVACUATOR SILICONE 100CC (DRAIN) IMPLANT
GAUZE SPONGE 4X4 16PLY XRAY LF (GAUZE/BANDAGES/DRESSINGS) ×1 IMPLANT
GLOVE BIO SURGEON STRL SZ7.5 (GLOVE) ×2 IMPLANT
GLOVE BIOGEL PI IND STRL 6.5 (GLOVE) IMPLANT
GLOVE BIOGEL PI IND STRL 7.0 (GLOVE) IMPLANT
GLOVE BIOGEL PI INDICATOR 6.5 (GLOVE) ×3
GLOVE BIOGEL PI INDICATOR 7.0 (GLOVE) ×1
GLOVE SKINSENSE NS SZ6.5 (GLOVE) ×4
GLOVE SKINSENSE NS SZ7.0 (GLOVE) ×3
GLOVE SKINSENSE NS SZ7.5 (GLOVE) ×1
GLOVE SKINSENSE STRL SZ6.5 (GLOVE) IMPLANT
GLOVE SKINSENSE STRL SZ7.0 (GLOVE) IMPLANT
GLOVE SKINSENSE STRL SZ7.5 (GLOVE) IMPLANT
GLOVE SURG SS PI 6.5 STRL IVOR (GLOVE) ×2 IMPLANT
GOWN BRE IMP SLV AUR XL STRL (GOWN DISPOSABLE) ×3 IMPLANT
GOWN STRL REUS W/ TWL LRG LVL3 (GOWN DISPOSABLE) ×3 IMPLANT
GOWN STRL REUS W/TWL LRG LVL3 (GOWN DISPOSABLE) ×10
KIT BASIN OR (CUSTOM PROCEDURE TRAY) ×2 IMPLANT
KIT ROOM TURNOVER OR (KITS) ×2 IMPLANT
LIQUID BAND (GAUZE/BANDAGES/DRESSINGS) ×1 IMPLANT
LOOP VESSEL MINI RED (MISCELLANEOUS) ×2 IMPLANT
NS IRRIG 1000ML POUR BTL (IV SOLUTION) ×4 IMPLANT
PACK PERIPHERAL VASCULAR (CUSTOM PROCEDURE TRAY) ×2 IMPLANT
PAD ARMBOARD 7.5X6 YLW CONV (MISCELLANEOUS) ×4 IMPLANT
PADDING CAST COTTON 6X4 STRL (CAST SUPPLIES) IMPLANT
SET COLLECT BLD 21X3/4 12 (NEEDLE) IMPLANT
SPONGE SURGIFOAM ABS GEL 100 (HEMOSTASIS) IMPLANT
STAPLER VISISTAT 35W (STAPLE) IMPLANT
STOPCOCK 4 WAY LG BORE MALE ST (IV SETS) IMPLANT
SUT PROLENE 5 0 C 1 24 (SUTURE) ×2 IMPLANT
SUT PROLENE 6 0 CC (SUTURE) ×3 IMPLANT
SUT PROLENE 7 0 BV 1 (SUTURE) ×1 IMPLANT
SUT PROLENE 7 0 BV1 MDA (SUTURE) IMPLANT
SUT SILK 2 0 SH (SUTURE) ×2 IMPLANT
SUT SILK 3 0 (SUTURE) ×6
SUT SILK 3-0 18XBRD TIE 12 (SUTURE) IMPLANT
SUT VIC AB 2-0 SH 27 (SUTURE) ×4
SUT VIC AB 2-0 SH 27XBRD (SUTURE) ×2 IMPLANT
SUT VIC AB 3-0 SH 27 (SUTURE) ×16
SUT VIC AB 3-0 SH 27X BRD (SUTURE) ×4 IMPLANT
SUT VIC AB 4-0 PS2 27 (SUTURE) ×5 IMPLANT
SYR BULB IRRIGATION 50ML (SYRINGE) ×1 IMPLANT
TAPE UMBILICAL COTTON 1/8X30 (MISCELLANEOUS) IMPLANT
TRAY FOLEY BAG SILVER LF 16FR (SET/KITS/TRAYS/PACK) ×1 IMPLANT
TRAY FOLEY W/METER SILVER 16FR (SET/KITS/TRAYS/PACK) ×1 IMPLANT
TUBING EXTENTION W/L.L. (IV SETS) IMPLANT
UNDERPAD 30X30 INCONTINENT (UNDERPADS AND DIAPERS) ×2 IMPLANT
WATER STERILE IRR 1000ML POUR (IV SOLUTION) ×2 IMPLANT

## 2016-02-08 NOTE — Anesthesia Postprocedure Evaluation (Signed)
Anesthesia Post Note  Patient: Jill Higgins  Procedure(s) Performed: Procedure(s) (LRB): Right FEMORAL- to Above Knee POPLITEAL ARTERY Bypass Graft with reversed saphenous vein and Common Femoral Endarterectomy  with profundoplasty (Right)  Patient location during evaluation: PACU Anesthesia Type: General Level of consciousness: awake and alert, oriented and patient cooperative Pain management: pain level controlled Vital Signs Assessment: post-procedure vital signs reviewed and stable Respiratory status: spontaneous breathing, nonlabored ventilation, respiratory function stable and patient connected to nasal cannula oxygen Cardiovascular status: blood pressure returned to baseline and stable Postop Assessment: no signs of nausea or vomiting Anesthetic complications: no    Last Vitals:  Filed Vitals:   02/08/16 1455 02/08/16 1500  BP: 143/62   Pulse: 55 53  Temp:    Resp: 9 9    Last Pain:  Filed Vitals:   02/08/16 1502  PainSc: Asleep                 Eddison Searls,E. Keland Peyton

## 2016-02-08 NOTE — Progress Notes (Signed)
  Day of Surgery Note    Subjective:  hungry  Filed Vitals:   02/08/16 1330 02/08/16 1340  BP:  144/68  Pulse: 61 56  Temp:    Resp: 11 10    Incisions:   Clean and dry  Extremities:  Right foot is warm with easily palpable right DP pulse Lungs:  Non labored   Assessment/Plan:  This is a 70 y.o. female who is s/p right femoral endarterectomy with right femoral to above knee popliteal bypass graft with reversed saphenous vein  -pt doing well in pacu with easily palpable right DP pulse -to 3 south when bed available -advance diet as tolerated.   Leontine Locket, PA-C 02/08/2016 3:00 PM

## 2016-02-08 NOTE — Anesthesia Procedure Notes (Signed)
Procedure Name: Intubation Date/Time: 02/08/2016 7:44 AM Performed by: Myna Bright Pre-anesthesia Checklist: Patient identified, Emergency Drugs available, Suction available and Patient being monitored Patient Re-evaluated:Patient Re-evaluated prior to inductionOxygen Delivery Method: Circle system utilized Preoxygenation: Pre-oxygenation with 100% oxygen Intubation Type: IV induction Ventilation: Mask ventilation without difficulty Laryngoscope Size: Mac and 3 Grade View: Grade II Tube type: Oral Tube size: 7.0 mm Number of attempts: 1 Airway Equipment and Method: Stylet Placement Confirmation: ETT inserted through vocal cords under direct vision,  positive ETCO2 and breath sounds checked- equal and bilateral Secured at: 21 cm Tube secured with: Tape Dental Injury: Teeth and Oropharynx as per pre-operative assessment

## 2016-02-08 NOTE — Transfer of Care (Signed)
Immediate Anesthesia Transfer of Care Note  Patient: Jill Higgins  Procedure(s) Performed: Procedure(s): Right FEMORAL- to Above Knee POPLITEAL ARTERY Bypass Graft with reversed saphenous vein and Common Femoral Endarterectomy  with profundoplasty (Right)  Patient Location: PACU  Anesthesia Type:General  Level of Consciousness: awake, alert , oriented and patient cooperative  Airway & Oxygen Therapy: Patient Spontanous Breathing and Patient connected to nasal cannula oxygen  Post-op Assessment: Report given to RN, Post -op Vital signs reviewed and stable and Patient moving all extremities  Post vital signs: Reviewed and stable  Last Vitals:  Filed Vitals:   02/08/16 0553  BP: 149/82  Pulse: 57  Temp: 36.3 C  Resp: 18    Complications: No apparent anesthesia complications

## 2016-02-08 NOTE — Op Note (Signed)
Procedure: Right common femoral endarterectomy with profundaplasty, right femoral to above-knee popliteal bypass reversed right greater saphenous vein Preoperative diagnosis: Claudication right leg Postoperative diagnosis: Same  Anesthesia: General  Asst.: Leontine Locket, PA-C  Operative findings: 3 mm right greater saphenous vein reversed femoral to above-knee popliteal bypass   Operative details: After obtaining informed consent, the patient was taken to the operating room. The patient was placed in supine position on the operating room table. After induction of general anesthesia and endotracheal intubation, a Foley catheter was placed. Next, the patient's entire right lower extremity was prepped and draped in the usual sterile fashion. A longitudinal incision was then made in the right groin and carried down through the subcutaneous tissues to expose the right common femoral artery. The common femoral artery was dissected free circumferentially. There was a pulse within the common femoral artery but it was very thickened. The distal external iliac artery was dissected free circumferentially underneath the inguinal ligament. A vessel loop was also placed around the distal external iliac artery. Dissection was then carried out the level of the femoral bifurcation. The superficial femoral and profunda femoris arteries were dissected free circumferentially and vessel loops placed around these.  Next the right greater saphenous vein was harvested through several skip incisions in the medial right leg. The vein was of good quality approximately 3 mm and uniform diameter throughout its course. Side branches were ligated and divided between silk ties or clips.  Next, a longitudinal incision was made on the medial aspect of the right leg above the knee. The greater saphenous vein was also harvested through this incision. Small side branches were ligated and divided between silk ties or clips. The vein was  reflected posteriorly. The incision was deepened down to the level of the fascia. The fascia was opened and the sartorius muscle reflected posteriorly. The above-knee popliteal space was entered. Dissection was carried down to the level of the above-knee popliteal artery this was dissected free circumferentially.Several centimeters of the popliteal artery was dissected free to make a reasonable area for grafting of the above-knee popliteal distal anastomosis. This had no pulse within it and was also thickened on palpation. A subsartorial tunnel was created connecting the above-knee incision to the groin. The patient was then given 6000 units of intravenous heparin. The greater saphenous vein was suture ligated distally and transected. The saphenofemoral junction was then ligated with a 2 0 silk tie. The vein was gently distended with heparinized saline.  After appropriate circulation time of the heparin, the distal right external iliac artery was controlled with a henley clamp. The profunda femoris and superficial femoral arteries were controlled with Vesseloops. A longitudinal opening was made in the common femoral artery just above the femoral bifurcation. The lumen was nearly obstructed so a femoral endarterectomy was performed extending from the inguinal ligament down to the profunda take off.  Two large profunda branches were endarterectomized with a good distal endpoint.  The SFA was chronically occluded but a portion was endarterectomized by eversion to make a reasonable sewing surface.  The vein was then spatulated and placed in a reversed configuration and sewn end of vein to side of artery using a running 6-0 Prolene suture. Graft was marked for orientation.  The graft was then brought through the subsartorial tunnel down to the above-knee popliteal artery. The above-knee popliteal artery was controlled proximally with a Henley clamp and distally with a small bulldog clamp. A longitudinal opening was  made in the distal above-knee popliteal  artery in an area that was fairly free of calcification. The graft was then cut to length and spatulated and signed end of graft to side of artery using running 6-0 Prolene suture. At completion of the anastomosis everything was forebled backbled and thoroughly flushed. The remainder of the anastomosis was completed and all clamps were removed restoring pulsatile flow to the above-knee popliteal artery. The patient had biphasic Doppler flow in the dorsalis pedis area of the foot. This augmented approximately 75% with unclamping the graft. She also had a palpable right DP pulse. After hemostasis was obtained, the deep layers and subcutaneous layers of the above-knee popliteal incision were closed with running 3-0 Vicryl suture. The skin was closed with a 4-0 Vicryl subcuticular stitch. The groin was inspected and found to be hemostatic. This was then closed in multiple layers of running 2 0 and 3-0 Vicryl suture and 4-0 subcuticular stitch. The patient tolerated the procedure well and there were no complications. Instrument sponge and needle counts correct in the case. Patient was taken to the recovery in stable condition.    Ruta Hinds, MD  [facility]  Office: [phone]  Pager: [phone]

## 2016-02-08 NOTE — Interval H&P Note (Signed)
History and Physical Interval Note:  02/08/2016 7:26 AM  Jill Higgins  has presented today for surgery, with the diagnosis of Peripheral vascular disease with right lower extremity claudication I70.211  The various methods of treatment have been discussed with the patient and family. After consideration of risks, benefits and other options for treatment, the patient has consented to  Procedure(s): BYPASS GRAFT FEMORAL-POPLITEAL ARTERY (Right) as a surgical intervention .  The patient's history has been reviewed, patient examined, no change in status, stable for surgery.  I have reviewed the patient's chart and labs.  Questions were answered to the patient's satisfaction.     Ruta Hinds

## 2016-02-08 NOTE — H&P (View-Only) (Signed)
[facility]  HISTORY AND PHYSICAL    History of Present Illness:  Patient is a 70 y.o. female who presents for evaluation of  Right leg claudication.   The patient has a known right superficial femoral artery occlusion from several years ago. She has been trying to manage this with walking alone. However, she recently retired and wishes to have revascularization. She is fairly limited in her walking distance. She also has several small operation she wanted to have on her foot and wanted to make sure she had adequate blood supply for healing. She has no problems in her left leg.Other medical problems include  Hypertension, hyperlipidemia , fibromyalgia, diabetes all of which are currently stable. She underwent left femoral to above-knee popliteal bypass in 2014. She currently is on aspirin. Recent arteriogram shows anatomically speaking she would be a candidate for right femoral to above-knee popliteal bypass. At the time of that arteriogram she underwent angioplasty of the distal anastomosis of her previously placed left femoral to above-knee popliteal bypass.    Past Medical History   Diagnosis  Date   .  Tobacco abuse     .  Hypertension     .  Hyperlipidemia     .  Peripheral vascular disease ([place])     .  Fibromyalgia     .  GERD (gastroesophageal reflux disease)     .  Depression with anxiety     .  Diabetes mellitus         Type II   .  Bronchitis     .  Bulging disc         in neck   .  Pneumonia     .  Overactive bladder         Past Surgical History   Procedure  Laterality  Date   .  Aortogram w/ pta    05/14/08,  11-04-10       Bilateral aortogram w/ bilateral  SFA PTA  stenting    .  Hand surgery  Left  2010   .  Tonsillectomy       .  Dilation and curettage of uterus           X4   .  Bunionectomy           L foot in the 1980s   .  Colonoscopy           2014   .  Epidural shots in neck        .  Femoral-popliteal bypass graft  Left   10/21/2013       Procedure: LEFT FEMORAL-POPLITEAL ARTERY BYPASS WITH SAPHENOUS VEIN GRAFT , POPLITEAL ENDARTERECTOMY ,INTRAOPERATIVE ARTERIOGRAM, vein patch angioplasty to popliteal artery;  Surgeon: Elam Dutch, MD;  Location: [facility] OR;  Service: Vascular;  Laterality: Left;   .  Groin debridement  Left  10/28/2013       Procedure: left inner thigh DEBRIDEMENT;  Surgeon: Elam Dutch, MD;  Location: [place];  Service: Vascular;  Laterality: Left;   .  Abdominal aortagram  N/A  08/15/2013       Procedure: ABDOMINAL Maxcine Ham;  Surgeon: Elam Dutch, MD;  Location: [facility] CATH LAB;  Service: Cardiovascular;  Laterality: N/A;   .  Abdominal aortagram  N/A  06/19/2014       Procedure: ABDOMINAL Maxcine Ham;  Surgeon: Elam Dutch, MD;  Location: [facility] CATH LAB;  Service: Cardiovascular;  Laterality: N/A;  Social History Social History   Substance Use Topics   .  Smoking status:  Current Every Day Smoker -- 0.10 packs/day       Types:  Cigarettes   .  Smokeless tobacco:  Never Used         Comment: pt states that she is using the E cig only smokes 1-20 regular cigs per month   .  Alcohol Use:  0.0 - 0.6 oz/week       0-1 Glasses of wine per week         Comment: Occasional     Family History Family History   Problem  Relation  Age of Onset   .  Heart disease  Father     .  Heart attack  Father         MI at age 50   .  Hypertension  Mother     .  Alzheimer's disease  Mother     .  Hypertension  Sister     .  Diabetes  Brother     .  Hyperlipidemia  Brother     .  Hypertension  Brother     .  Heart disease  Brother     .  Heart attack  Brother       Allergies  No Known Allergies     Current Outpatient Prescriptions   Medication  Sig  Dispense  Refill   .  amLODipine-olmesartan (AZOR) 5-40 MG per tablet  Take 1 tablet by mouth daily.       Marland Kitchen  aspirin EC 81 MG tablet  Take 81 mg by mouth daily.       Marland Kitchen  atenolol (TENORMIN) 50 MG tablet  Take 50 mg by mouth daily.       .   naproxen sodium (ALEVE) 220 MG tablet  Take 220 mg by mouth 2 (two) times daily as needed (for pain).       .  NON FORMULARY  Take 2 tablets by mouth daily. Hair Infinity       .  pantoprazole (PROTONIX) 20 MG tablet  Take 2 tablets (40 mg total) by mouth 2 (two) times daily.  120 tablet  0   .  rosuvastatin (CRESTOR) 5 MG tablet  Take 2.5 mg by mouth daily.       .  sitaGLIPtan-metformin (JANUMET) 50-1000 MG per tablet  Take 1 tablet by mouth 2 (two) times daily with a meal.       .  sucralfate (CARAFATE) 1 GM/10ML suspension  Take 10 mLs (1 g total) by mouth 4 (four) times daily -  with meals and at bedtime.  420 mL  0   .  temazepam (RESTORIL) 30 MG capsule  Take 30 mg by mouth at bedtime.       .  TRAVATAN Z 0.004 % SOLN ophthalmic solution  Place 1 drop into both eyes at bedtime.        .  bismuth subsalicylate (PEPTO BISMOL) 262 MG/15ML suspension  Take 30 mLs by mouth every 6 (six) hours as needed for indigestion or diarrhea or loose stools. Reported on 11/04/2015       .  HYDROcodone-acetaminophen (NORCO/VICODIN) 5-325 MG per tablet  Take 1 tablet by mouth every 4 (four) hours as needed for moderate pain. (Patient not taking: Reported on 11/04/2015)  20 tablet  0   .  PROAIR HFA 108 (90 BASE) MCG/ACT inhaler  Inhale 2 puffs into the lungs  every 4 (four) hours as needed for wheezing or shortness of breath. Reported on 11/04/2015          No current facility-administered medications for this visit.     ROS:    General:  No weight loss, Fever, chills  HEENT: No recent headaches, no nasal bleeding, no visual changes, no sore throat  Neurologic: No dizziness, blackouts, seizures. No recent symptoms of stroke or mini- stroke. No recent episodes of slurred speech, or temporary blindness.  Cardiac: No recent episodes of chest pain/pressure, no shortness of breath at rest.  No shortness of breath with exertion.  Denies history of atrial fibrillation or irregular heartbeat  Vascular: No  history of rest pain in feet.  No history of claudication.  No history of non-healing ulcer, No history of DVT    Pulmonary: No home oxygen, no productive cough, no hemoptysis,  No asthma or wheezing  Musculoskeletal:  [ ]  Arthritis, [ ]  Low back pain,  [ ]  Joint pain  Hematologic:No history of hypercoagulable state.  No history of easy bleeding.  No history of anemia  Gastrointestinal: No hematochezia or melena,  No gastroesophageal reflux, no trouble swallowing  Urinary: [ ]  chronic Kidney disease, [ ]  on HD - [ ]  MWF or [ ]  TTHS, [ ]  Burning with urination, [ ]  Frequent urination, [ ]  Difficulty urinating;    Skin: No rashes  Psychological: No history of anxiety,  No history of depression   Physical Examination    Filed Vitals:   01/20/16 0938  BP: 140/86  Pulse: 56  Height: 5\' 3"  (1.6 m)  Weight: 148 lb (67.132 kg)  SpO2: 95%    General:  Alert and oriented, no acute distress HEENT: Normal Neck: No bruit or JVD Pulmonary: Clear to auscultation bilaterally Cardiac: Regular Rate and Rhythm without murmur Abdomen: Soft, non-tender, non-distended, no mass, no scars Skin: No rash Extremity Pulses:  2+ radial, brachial, femoral,  2+ left dorsalis pedis absent right dorsalis pedis  and posterior tibial pulse Musculoskeletal: No deformity or edema     Neurologic: Upper and lower extremity motor 5/5 and symmetric  DATA:   Patient's last ABIs were reviewed from August 2016. Greater than 1 left 0.7 right   ASSESSMENT:   Short distance lifestyle limiting claudication right lower extremity. At this point patient wishes to proceed with revascularization.   PLAN:   right femoral to above-knee popliteal bypass on 02/01/2016.  Risks benefits possible complications and procedure details were discussed with the patient today including but not limited to bleeding infection graft thrombosis return of claudication symptoms limited durability without smoking cessation. She understands and  agrees to proceed.  Ruta Hinds, MD [facility] [place] Office: [phone] Pager: [phone]

## 2016-02-08 NOTE — Progress Notes (Signed)
Report given to philip rn as caregiver 

## 2016-02-09 ENCOUNTER — Encounter (HOSPITAL_COMMUNITY): Payer: Self-pay | Admitting: Vascular Surgery

## 2016-02-09 LAB — BASIC METABOLIC PANEL
Anion gap: 5 (ref 5–15)
BUN: 6 mg/dL (ref 6–20)
CO2: 26 mmol/L (ref 22–32)
Calcium: 8.6 mg/dL — ABNORMAL LOW (ref 8.9–10.3)
Chloride: 107 mmol/L (ref 101–111)
Creatinine, Ser: 0.77 mg/dL (ref 0.44–1.00)
GFR calc Af Amer: >60 mL/min (ref >60)
GFR calc non Af Amer: >60 mL/min (ref >60)
Glucose, Bld: 139 mg/dL — ABNORMAL HIGH (ref 65–99)
Potassium: 3.6 mmol/L (ref 3.5–5.1)
Sodium: 138 mmol/L (ref 135–145)

## 2016-02-09 LAB — GLUCOSE, CAPILLARY
Glucose-Capillary: 132 mg/dL — ABNORMAL HIGH (ref 65–99)
Glucose-Capillary: 147 mg/dL — ABNORMAL HIGH (ref 65–99)
Glucose-Capillary: 129 mg/dL — ABNORMAL HIGH (ref 65–99)
Glucose-Capillary: 147 mg/dL — ABNORMAL HIGH (ref 65–99)

## 2016-02-09 LAB — CBC
HCT: 33 % — ABNORMAL LOW (ref 36.0–46.0)
Hemoglobin: 10.4 g/dL — ABNORMAL LOW (ref 12.0–15.0)
MCH: 29.1 pg (ref 26.0–34.0)
MCHC: 31.5 g/dL (ref 30.0–36.0)
MCV: 92.2 fL (ref 78.0–100.0)
Platelets: 239 10*3/uL (ref 150–400)
RBC: 3.58 MIL/uL — ABNORMAL LOW (ref 3.87–5.11)
RDW: 13.9 % (ref 11.5–15.5)
WBC: 6.7 10*3/uL (ref 4.0–10.5)

## 2016-02-09 MED ORDER — CHLORHEXIDINE GLUCONATE CLOTH 2 % EX PADS
6.0000 | MEDICATED_PAD | Freq: Every day | CUTANEOUS | Status: DC
  Administered 2016-02-09 – 2016-02-12 (×4): 6 via TOPICAL

## 2016-02-09 MED ORDER — PNEUMOCOCCAL VAC POLYVALENT 25 MCG/0.5ML IJ INJ
0.5000 mL | INJECTION | INTRAMUSCULAR | Status: AC
Start: 2016-02-10 — End: 2016-02-12
  Administered 2016-02-12: 0.5 mL via INTRAMUSCULAR

## 2016-02-09 MED ORDER — MUPIROCIN 2 % EX OINT
1.0000 "application " | TOPICAL_OINTMENT | Freq: Two times a day (BID) | CUTANEOUS | Status: DC
  Administered 2016-02-09 – 2016-02-12 (×7): 1 via NASAL
  Filled 2016-02-09 (×2): qty 22

## 2016-02-09 NOTE — Evaluation (Signed)
Occupational Therapy Evaluation Patient Details Name: Jill Higgins MRN: [id number] DOB: 04-08-1946 Today's Date: 02/09/2016    History of Present Illness 41 female s/p BYPASS GRAFT FEMORAL-POPLITEAL ARTERY (Right) PMH: HTN, PVD, Fibromyalgia, DM 2, depression with anxiety, bulging neck dis, overactive bladder, aortogram, hand surg, L femoral- popliteal artery    Clinical Impression   Patient is s/p R bypass graft femoral-popliteal artery surgery resulting in functional limitations due to the deficits listed below (see OT problem list). PTA living at home alone and independent with all adls. Patient will benefit from skilled OT acutely to increase independence and safety with ADLS to allow discharge [name]. Pt required oxygen with transfer due to decr oxygen levels noted at 75%. Questions accuracy due to wave form and pt using pulse ox hand during transfer.      Follow Up Recommendations  Home health OT    Equipment Recommendations  3 in 1 bedside comode    Recommendations for Other Services       Precautions / Restrictions Precautions Precautions: Fall Precaution Comments: watch oxygen - dropped during OT eval but question patient pulling with L hand and true reading      Mobility Bed Mobility Overal bed mobility: Needs Assistance Bed Mobility: Supine to Sit     Supine to sit: Max assist;HOB elevated     General bed mobility comments: educated on using sheet as leg lifter and exiting on the L with bed rail. pt unable to push up into sitting with L UE. pt required (A) to bring hips square to EOB to place bil LE on floor  Transfers Overall transfer level: Needs assistance Equipment used: Rolling walker (2 wheeled) Transfers: Sit to/from Stand Sit to Stand: Mod assist;From elevated surface         General transfer comment: cues to push up from bed and bed elevated to (A) with standing    Balance                                            ADL  Overall ADL's : Needs assistance/impaired     Grooming: Modified independent;Bed level   Upper Body Bathing: Set up;Bed level   Lower Body Bathing: Moderate assistance;Sit to/from stand   Upper Body Dressing : Supervision/safety;Bed level   Lower Body Dressing: Moderate assistance;Sit to/from stand Lower Body Dressing Details (indicate cue type and reason): required OT to don sock on R LE ( hole cut in sock for easy pulse check) Toilet Transfer: Maximal assistance;Stand-pivot Toilet Transfer Details (indicate cue type and reason): urgency and voiding prior to reaching 3n1 Toileting- Clothing Manipulation and Hygiene: Moderate assistance;Sit to/from stand Toileting - Clothing Manipulation Details (indicate cue type and reason): required standing for peri care       General ADL Comments: Pt sit<>stand with incontinence and reports this happens PTA. pt with incontinence supine in bed and lack of awareness.      Vision     Perception     Praxis      Pertinent Vitals/Pain Pain Assessment: Faces Faces Pain Scale: Hurts worst Pain Location: R LE Pain Descriptors / Indicators: Constant Pain Intervention(s): Monitored during session;Premedicated before session;Repositioned     Hand Dominance Right   Extremity/Trunk Assessment Upper Extremity Assessment Upper Extremity Assessment: Overall WFL for tasks assessed   Lower Extremity Assessment Lower Extremity Assessment: Defer to PT evaluation   Cervical /  Trunk Assessment Cervical / Trunk Assessment: Normal   Communication Communication Communication: No difficulties   Cognition Arousal/Alertness: Awake/alert Behavior During Therapy: WFL for tasks assessed/performed                       General Comments       Exercises       Shoulder Instructions      Home Living Family/patient expects to be discharged to:: Private residence Living Arrangements: Alone Available Help at Discharge: Family;Available  PRN/intermittently Type of Home: House Home Access: Stairs to enter     Home Layout: Two level;Bed/bath upstairs     Bathroom Shower/Tub: Teacher, early years/pre: Standard     Home Equipment: Cane - single point;Walker - 2 wheels;Shower seat;Crutches          Prior Functioning/Environment Level of Independence: Independent             OT Diagnosis: Generalized weakness;Acute pain   OT Problem List: Decreased strength;Decreased activity tolerance;Impaired balance (sitting and/or standing);Decreased cognition;Decreased safety awareness;Decreased knowledge of use of DME or AE;Decreased knowledge of precautions;Cardiopulmonary status limiting activity;Pain   OT Treatment/Interventions: Self-care/ADL training;Therapeutic exercise;Energy conservation;DME and/or AE instruction;Therapeutic activities;Cognitive remediation/compensation;Patient/family education;Balance training    OT Goals(Current goals can be found in the care plan section) Acute Rehab OT Goals Patient Stated Goal: none stated OT Goal Formulation: With patient Time For Goal Achievement: 02/23/16 Potential to Achieve Goals: Good  OT Frequency: Min 2X/week   Barriers to D/C: Decreased caregiver support          Co-evaluation              End of Session Equipment Utilized During Treatment: Gait belt;Rolling walker;Oxygen Nurse Communication: Mobility status;Precautions;Patient requests pain meds  Activity Tolerance: Patient tolerated treatment well;Other (comment) (oxygen reading 75% briefly during session) Patient left: in chair;with call bell/phone within reach;with nursing/sitter in room   Time: 1145-1203 OT Time Calculation (min): 18 min Charges:  OT General Charges $OT Visit: 1 Procedure OT Evaluation $OT Eval High Complexity: 1 Procedure G-Codes:    Peri Maris 2016-02-13, 12:30 PM  Jeri Modena   OTR/L Pager: [phone] Office: [phone] .

## 2016-02-09 NOTE — Evaluation (Signed)
Physical Therapy Evaluation Patient Details Name: Jill Higgins MRN: [id number] DOB: [birth year] Today's Date: 02/09/2016   History of Present Illness  22 female s/p BYPASS GRAFT FEMORAL-POPLITEAL ARTERY (Right) PMH: HTN, PVD, Fibromyalgia, DM 2, depression with anxiety, bulging neck dis, overactive bladder, aortogram, hand surg, L femoral- popliteal artery     Clinical Impression  Pt admitted with above diagnosis. Pt currently with functional limitations due to the deficits listed below (see PT Problem List). Ms. Faurot was Ind PTA and living alone.  She currently requires min assist for safe mobility.  Pt required 1L O2 to maintain SpO2 above 90% while ambulating. Pt will benefit from skilled PT to increase their independence and safety with mobility to allow discharge to the venue listed below.      Follow Up Recommendations Home health PT;Supervision for mobility/OOB    Equipment Recommendations  None recommended by PT    Recommendations for Other Services       Precautions / Restrictions Precautions Precautions: Fall Precaution Comments: watch O2 and HR Restrictions Weight Bearing Restrictions: No      Mobility  Bed Mobility Overal bed mobility: Needs Assistance Bed Mobility: Supine to Sit     Supine to sit: Max assist;HOB elevated     General bed mobility comments: Pt sitting in recliner chair upon PT arrival  Transfers Overall transfer level: Needs assistance Equipment used: Rolling walker (2 wheeled) Transfers: Sit to/from [facility] Sit to Stand: Min assist Stand pivot transfers: Min assist       General transfer comment: Cues for hand placement when sitting and standing. Slow to stand and min assist to boost up.    Ambulation/Gait Ambulation/Gait assistance: Min guard Ambulation Distance (Feet): 50 Feet Assistive device: Rolling walker (2 wheeled) Gait Pattern/deviations: Step-through pattern;Decreased stride length;Decreased weight  shift to right;Trunk flexed   Gait velocity interpretation: Below normal speed for age/gender General Gait Details: Cues for upright posture w/ evolving step through gait pattern.    Stairs            Wheelchair Mobility    Modified Rankin (Stroke Patients Only)       Balance Overall balance assessment: Needs assistance Sitting-balance support: Feet supported;No upper extremity supported Sitting balance-Leahy Scale: Good     Standing balance support: Bilateral upper extremity supported;During functional activity Standing balance-Leahy Scale: Poor Standing balance comment: RW for support                             Pertinent Vitals/Pain Pain Assessment: 0-10 Pain Score: 7  Faces Pain Scale: Hurts worst Pain Location: Incision site Pain Descriptors / Indicators: Operative site guarding;Aching Pain Intervention(s): Limited activity within patient's tolerance;Monitored during session;Repositioned;RN gave pain meds during session    Home Living Family/patient expects to be discharged to:: Private residence Living Arrangements: Alone Available Help at Discharge: Family;Available PRN/intermittently Type of Home: House Home Access: Level entry     Home Layout: Two level;Bed/bath upstairs Home Equipment: Cane - single point;Walker - 2 wheels;Shower seat;Crutches      Prior Function Level of Independence: Independent               Hand Dominance   Dominant Hand: Right    Extremity/Trunk Assessment   Upper Extremity Assessment: Defer to OT evaluation           Lower Extremity Assessment: RLE deficits/detail;LLE deficits/detail RLE Deficits / Details: limited ROM and strength as expected s/p Rt LE bypass  LLE Deficits / Details: scars from h/o Lt LE bypass  Cervical / Trunk Assessment: Normal  Communication   Communication: No difficulties  Cognition Arousal/Alertness: Awake/alert Behavior During Therapy: WFL for tasks  assessed/performed Overall Cognitive Status: Within Functional Limits for tasks assessed                      General Comments General comments (skin integrity, edema, etc.): SpO2 down to 87% on RA while ambulating and required 1L O2 to maintain SpO2 above 90%.  HR down to 39 non sustained while ambulating, otherwise 70s-80s.    Exercises General Exercises - Lower Extremity Ankle Circles/Pumps: AROM;Both;10 reps;Seated Long Arc Quad: AROM;10 reps;Right;Seated      Assessment/Plan    PT Assessment Patient needs continued PT services  PT Diagnosis Difficulty walking;Abnormality of gait;Acute pain   PT Problem List Decreased strength;Decreased range of motion;Decreased activity tolerance;Decreased balance;Decreased mobility;Decreased knowledge of use of DME;Decreased safety awareness;Cardiopulmonary status limiting activity;Pain;Impaired sensation  PT Treatment Interventions DME instruction;Gait training;Stair training;Functional mobility training;Therapeutic activities;Therapeutic exercise;Balance training;Patient/family education;Modalities   PT Goals (Current goals can be found in the Care Plan section) Acute Rehab PT Goals Patient Stated Goal: none stated PT Goal Formulation: With patient Time For Goal Achievement: 02/23/16 Potential to Achieve Goals: Good    Frequency Min 3X/week   Barriers to discharge Inaccessible home environment flight to get up to bedroom; lives alone w/ limited assist    Co-evaluation               End of Session Equipment Utilized During Treatment: Gait belt;Oxygen Activity Tolerance: Patient limited by pain;Patient tolerated treatment well Patient left: in chair;with call bell/phone within reach Nurse Communication: Mobility status;Other (comment) (SpO2, pt c/o itching at electrode pad site)         Time: [id number] PT Time Calculation (min) (ACUTE ONLY): 33 min   Charges:   PT Evaluation $PT Eval Moderate Complexity: 1  Procedure PT Treatments $Gait Training: 8-22 mins   PT G Codes:       Collie Siad PT, DPT  Pager: [phone] Phone: [phone] 02/09/2016, 3:10 PM

## 2016-02-09 NOTE — Progress Notes (Addendum)
  [facility] Progress Note  Subjective  - POD #1  Having some soreness with incisions.   Objective Filed Vitals:   02/09/16 0500 02/09/16 0600  BP: 134/57 103/73  Pulse: 55 64  Temp:    Resp: 13 20  Tmax 98.7 BP sys 100s-150s 02 98% 2L  Intake/Output Summary (Last 24 hours) at 02/09/16 0737 Last data filed at 02/09/16 0600  Gross per 24 hour  Intake 2962.5 ml  Output   2485 ml  Net  477.5 ml    Right leg incisions clean and intact.  Palpable DP pulses bilaterally   Assessment/Planning: 70 y.o. female is s/p: Right common femoral endarterectomy with profundaplasty, right femoral to above-knee popliteal bypass reversed right greater saphenous vein 1 Day Post-Op   Easily palpable right pedal pulse.  Incisions healing well. No hematoma.  ABLA tolerating.  BP stable. BB held due to bradycardia yesterday.  Mobilize.  Transfer to 2W.  Lovenox for DVT prophylaxis.   Alvia [place] 02/09/2016 7:37 AM -- Agree with above.  No hematoma.  Bilateral DP pulses. Transfer to 2w Home when ambulatory  Ruta Hinds, MD [facility] [place] Office: [phone] Pager: [phone]  Laboratory CBC    Component Value Date/Time   WBC 6.7 02/09/2016 0440   HGB 10.4* 02/09/2016 0440   HCT 33.0* 02/09/2016 0440   PLT 239 02/09/2016 0440    BMET    Component Value Date/Time   NA 138 02/09/2016 0440   K 3.6 02/09/2016 0440   CL 107 02/09/2016 0440   CO2 26 02/09/2016 0440   GLUCOSE 139* 02/09/2016 0440   BUN 6 02/09/2016 0440   CREATININE 0.77 02/09/2016 0440   CALCIUM PENDING 02/09/2016 0440   GFRNONAA >60 02/09/2016 0440   GFRAA >60 02/09/2016 0440    COAG Lab Results  Component Value Date   INR 0.97 02/03/2016   INR 0.88 09/29/2013   INR 0.9 05/14/2008   No results found for: PTT  Antibiotics Anti-infectives    Start     Dose/Rate Route Frequency Ordered Stop   02/08/16 2015  cefUROXime (ZINACEF) 1.5 g in  dextrose 5 % 50 mL IVPB     1.5 g 100 mL/hr over 30 Minutes Intravenous Every 12 hours 02/08/16 2001 02/09/16 2014   02/08/16 0700  cefUROXime (ZINACEF) 1.5 g in dextrose 5 % 50 mL IVPB     1.5 g 100 mL/hr over 30 Minutes Intravenous To ShortStay Surgical 02/07/16 0935 02/08/16 [place], PA-C [facility] Office: [phone] Pager: [phone] 02/09/2016 7:37 AM

## 2016-02-09 NOTE — Progress Notes (Signed)
Utilization review completed. Ashdon Gillson, RN, BSN. 

## 2016-02-10 ENCOUNTER — Telehealth: Payer: Self-pay | Admitting: Vascular Surgery

## 2016-02-10 ENCOUNTER — Ambulatory Visit (HOSPITAL_COMMUNITY): Payer: Medicare HMO

## 2016-02-10 DIAGNOSIS — I739 Peripheral vascular disease, unspecified: Secondary | ICD-10-CM

## 2016-02-10 LAB — GLUCOSE, CAPILLARY
Glucose-Capillary: 134 mg/dL — ABNORMAL HIGH (ref 65–99)
Glucose-Capillary: 139 mg/dL — ABNORMAL HIGH (ref 65–99)
Glucose-Capillary: 131 mg/dL — ABNORMAL HIGH (ref 65–99)
Glucose-Capillary: 121 mg/dL — ABNORMAL HIGH (ref 65–99)

## 2016-02-10 NOTE — Progress Notes (Signed)
Occupational Therapy Treatment Patient Details Name: Jill Higgins MRN: [id number] DOB: [birth year] Today's Date: 02/10/2016    History of present illness 49 female s/p BYPASS GRAFT FEMORAL-POPLITEAL ARTERY (Right) PMH: HTN, PVD, Fibromyalgia, DM 2, depression with anxiety, bulging neck dis, overactive bladder, aortogram, hand surg, L femoral- popliteal artery    OT comments  Pt ambulating in her room and to bathroom without device. Pt requiring minimum assist for reaching R LE due to incisional pain. Able to stand and perform grooming at sink. Pt is not interested in AE, will rely on assist of her daughter if still necessary at discharge.   Follow Up Recommendations  No OT follow up    Equipment Recommendations  3 in 1 bedside comode    Recommendations for Other Services      Precautions / Restrictions Precautions Precautions: Fall Precaution Comments: watch O2 and HR Restrictions Weight Bearing Restrictions: No       Mobility Bed Mobility Overal bed mobility: Modified Independent Bed Mobility: Supine to Sit;Sit to Supine           General bed mobility comments: use of rail, extra time, HOB down  Transfers Overall transfer level: Needs assistance Equipment used: None Transfers: Sit to/from Stand Sit to Stand: Modified independent (Device/Increase time)         General transfer comment: increased time, guards R LE with sit to stand    Balance Overall balance assessment: Needs assistance   Sitting balance-Leahy Scale: Good       Standing balance-Leahy Scale: Fair Standing balance comment: able to stand at sink and perform grooming without support                   ADL Overall ADL's : Needs assistance/impaired     Grooming: Modified independent;Standing   Upper Body Bathing: Set up;Sitting   Lower Body Bathing: Minimal assistance;Sit to/from stand Lower Body Bathing Details (indicate cue type and reason): recommended long bath sponge if  continues to be dependent Upper Body Dressing : Set up;Sitting   Lower Body Dressing: Minimal assistance;Sit to/from stand Lower Body Dressing Details (indicate cue type and reason): donned L sock independently, assist to start R sock over toe, pt does better seated on toilet or chair as is it lower to the ground Toilet Transfer: Modified Independent;Ambulation Toilet Transfer Details (indicate cue type and reason): Pt has been routinely taking herself to the bathroom.  Toileting- Clothing Manipulation and Hygiene: Set up;Sitting/lateral lean Toileting - Clothing Manipulation Details (indicate cue type and reason): changed pad in sitting     Functional mobility during ADLs: Modified independent (within room/bathroom)        Vision                     Perception     Praxis      Cognition   Behavior During Therapy: WFL for tasks assessed/performed Overall Cognitive Status: Within Functional Limits for tasks assessed                       Extremity/Trunk Assessment               Exercises     Shoulder Instructions       General Comments      Pertinent Vitals/ Pain       Pain Assessment: Faces Pain Score: 5  Faces Pain Scale: Hurts little more Pain Location: incision Pain Descriptors / Indicators: Operative site guarding Pain Intervention(s): Monitored  during session  Home Living                                          Prior Functioning/Environment              Frequency Min 2X/week     Progress Toward Goals  OT Goals(current goals can now be found in the care plan section)  Progress towards OT goals: Progressing toward goals  Acute Rehab OT Goals Patient Stated Goal: none stated  Plan Discharge plan remains appropriate    Co-evaluation                 End of Session Equipment Utilized During Treatment: Gait belt   Activity Tolerance Patient tolerated treatment well   Patient Left in bed;with call  bell/phone within reach   Nurse Communication  (ok to give pt regular ginger ale)        Time: [id number] OT Time Calculation (min): 17 min  Charges: OT General Charges $OT Visit: 1 Procedure OT Treatments $Self Care/Home Management : 8-22 mins  Malka So 02/10/2016, 10:50 AM  [phone]

## 2016-02-10 NOTE — Telephone Encounter (Signed)
-----   Message from Mena Goes, RN sent at 02/08/2016 12:49 PM EDT ----- Regarding: schedule   ----- Message -----    From: Gabriel Earing, PA-C    Sent: 02/08/2016  12:29 PM      To: [facility] Charge Pool  S/p right fem pop bypass 02/08/16.  F/u with [facility] in 2 weeks.  Thanks, Aldona Bar

## 2016-02-10 NOTE — Care Management Note (Signed)
Case Management Note Marvetta Gibbons RN, BSN Unit 2W-Case Manager [phone]  Patient Details  Name: Jill Higgins MRN: [id number] Date of Birth: 1946/03/24  Subjective/Objective:  Pt admitted s/p fempop bypass                  Action/Plan: PTA pt lived at home, per conversation with pt has a cane and tub seat at home- has someone that she plans to borrow a RW from- does not want to get one for herself. Orders have been placed for HH-PT/OT- pt agreeable and choice offered for [facility] agency in [facility]- per pt she thinks she has used [facility] in past and is fine with using them again. Referral called to Lelan Pons with [facility] for HH-PT/OT services at discharge- possible d/c tomorrow 3/24- per pt she does not need any other DME- no other CM needs noted.   Expected Discharge Date:   02/11/16               Expected Discharge Plan:  [place]  In-House Referral:     Discharge planning Services  CM Consult  Post Acute Care Choice:  Home Health Choice offered to:  Patient  DME Arranged:    DME Agency:     HH Arranged:  PT, OT HH Agency:  [place]  Status of Service:  Completed, signed off  Medicare Important Message Given:    Date Medicare IM Given:    Medicare IM give by:    Date Additional Medicare IM Given:    Additional Medicare Important Message give by:     If discussed at [place] of Stay Meetings, dates discussed:    Discharge Disposition: home/home health   Additional Comments:  Dawayne Patricia, RN 02/10/2016, 11:28 AM

## 2016-02-10 NOTE — Progress Notes (Addendum)
  Progress Note    02/10/2016 8:29 AM 2 Days Post-Op  Subjective:  C/o soreness in the groin  Tm 99.4 HR 60's NSR 0000000 systolic 0000000 RA  Filed Vitals:   02/09/16 2237 02/10/16 0634  BP: 117/73 105/50  Pulse: 77 65  Temp: 98.4 F (36.9 C) 99.4 F (37.4 C)  Resp: 18 18    Physical Exam: Cardiac:  regular Lungs:  Non labored Incisions:  All incisions are clean and dry Extremities:  Palpable DP pulses bilaterally   CBC    Component Value Date/Time   WBC 6.7 02/09/2016 0440   RBC 3.58* 02/09/2016 0440   HGB 10.4* 02/09/2016 0440   HCT 33.0* 02/09/2016 0440   PLT 239 02/09/2016 0440   MCV 92.2 02/09/2016 0440   MCH 29.1 02/09/2016 0440   MCHC 31.5 02/09/2016 0440   RDW 13.9 02/09/2016 0440   LYMPHSABS 3.5 04/02/2015 0500   MONOABS 0.4 04/02/2015 0500   EOSABS 0.2 04/02/2015 0500   BASOSABS 0.0 04/02/2015 0500    BMET    Component Value Date/Time   NA 138 02/09/2016 0440   K 3.6 02/09/2016 0440   CL 107 02/09/2016 0440   CO2 26 02/09/2016 0440   GLUCOSE 139* 02/09/2016 0440   BUN 6 02/09/2016 0440   CREATININE 0.77 02/09/2016 0440   CALCIUM 8.6* 02/09/2016 0440   GFRNONAA >60 02/09/2016 0440   GFRAA >60 02/09/2016 0440    INR    Component Value Date/Time   INR 0.97 02/03/2016 1100    No intake or output data in the 24 hours ending 02/10/16 [id number]   Assessment:  70 y.o. female is s/p:  /p: Right common femoral endarterectomy with profundaplasty, right femoral to above-knee popliteal bypass reversed right greater saphenous vein  2 Days Post-Op  Plan: -pt doing well this morning with patent bypass graft and palpable DP pulses bilaterally -discussed groin wound care and keeping it dry to help prevent wound infection.  She expressed understanding. -DVT prophylaxis:  Lovenox -systolic BP still a bit soft-will hold restarting Atenolol for now. -most likely home tomorrow -PT/OT recommend HHPT/OT-case management ordered and face to face  completed.   Leontine Locket, PA-C [facility] [phone] 02/10/2016 8:29 AM  Addendum  I have independently interviewed and examined the patient, and I agree with the physician assistant's findings.    Adele Barthel, MD [facility] [place] Office: [phone] Pager: [phone]  02/10/2016, 9:49 AM

## 2016-02-10 NOTE — Progress Notes (Signed)
VASCULAR LAB PRELIMINARY  PRELIMINARY  PRELIMINARY  PRELIMINARY   ARTERIAL  ABI completed:    RIGHT    LEFT    PRESSURE WAVEFORM  PRESSURE WAVEFORM  BRACHIAL 126 Triphasic BRACHIAL 125 Triphasic  DP 93 Biphasic DP 113 Triphasic  PT 96 Biphasic PT 102 Triphasic    RIGHT LEFT  ABI 0.76 0.90   Arterial insufficiency involving the right lower extremity. Disease regression from the study of 12/25/2014.    ABI' s and Doppler waveforms in the right lower extremity is improved from prior study - lower moderate arterial diease. right ABI -0.76 Left -.0.90 mild arterial disease.  Janifer Adie, RVT, RDMS 02/10/2016, 3:56 PM

## 2016-02-10 NOTE — Progress Notes (Signed)
Physical Therapy Treatment Patient Details Name: Jill Higgins MRN: [id number] DOB: 1946/08/15 Today's Date: 02/10/2016    History of Present Illness 39 female s/p BYPASS GRAFT FEMORAL-POPLITEAL ARTERY (Right) PMH: HTN, PVD, Fibromyalgia, DM 2, depression with anxiety, bulging neck dis, overactive bladder, aortogram, hand surg, L femoral- popliteal artery     PT Comments    Pt performed increased gait distance without AD.  Pt performed flight of stairs for safe entry into bed room at home.  Pt performed with 5/10 pain in R groin.  Pt highly motivated to return home.    Follow Up Recommendations  Home health PT;Supervision for mobility/OOB     Equipment Recommendations  None recommended by PT    Recommendations for Other Services       Precautions / Restrictions Precautions Precautions: Fall Precaution Comments: watch O2 and HR Restrictions Weight Bearing Restrictions: No    Mobility  Bed Mobility               General bed mobility comments:  (Pt standing in room on arrival.  )  Transfers Overall transfer level: Needs assistance Equipment used: None Transfers: Sit to/from Stand Sit to Stand: Modified independent (Device/Increase time) (stand to sit with poor eccentric loading and control due to pain in incision site.  Pt required S with cues to correct technique.  )         General transfer comment: Cues for eccentric loading to improve safety.    Ambulation/Gait Ambulation/Gait assistance: Min guard Ambulation Distance (Feet): 250 Feet Assistive device: None Gait Pattern/deviations: Step-through pattern;Antalgic;Decreased stride length;Decreased weight shift to right;Decreased stance time - right;Shuffle   Gait velocity interpretation: Below normal speed for age/gender General Gait Details: Cues for reciprocal armswing and to increase B step length.  Pt with slow steady gait.     Stairs Stairs: Yes Stairs assistance: Min guard Stair Management: One  rail Right;One rail Left;Step to pattern;Sideways (R ascending and L descending.  ) Number of Stairs: 10 General stair comments: Pt performed forward non reciprocal pattern ascending and sideways non reciprocal descending.  Pt required cues for sequencing and hand placement to improve safety.    Wheelchair Mobility    Modified Rankin (Stroke Patients Only)       Balance Overall balance assessment: Needs assistance   Sitting balance-Leahy Scale: Good       Standing balance-Leahy Scale: Fair                      Cognition Arousal/Alertness: Awake/alert Behavior During Therapy: WFL for tasks assessed/performed Overall Cognitive Status: Within Functional Limits for tasks assessed                      Exercises      General Comments        Pertinent Vitals/Pain Pain Assessment: 0-10 Pain Score: 5  Pain Location: incision site R groin. Pain Descriptors / Indicators: Aching;Operative site guarding Pain Intervention(s): Limited activity within patient's tolerance;Repositioned    Home Living                      Prior Function            PT Goals (current goals can now be found in the care plan section) Acute Rehab PT Goals Patient Stated Goal: none stated Potential to Achieve Goals: Good Progress towards PT goals: Progressing toward goals    Frequency  Min 3X/week    PT Plan  Co-evaluation             End of Session Equipment Utilized During Treatment: Gait belt;Oxygen Activity Tolerance: Patient limited by pain;Patient tolerated treatment well Patient left: with call bell/phone within reach;in bed (sitting edge of bed.  )     Time: [id number] PT Time Calculation (min) (ACUTE ONLY): 15 min  Charges:  $Gait Training: 8-22 mins                    G Codes:      Cristela Blue 01-Mar-2016, 10:05 AM  Governor [place]ooks, PTA pager [phone]

## 2016-02-11 ENCOUNTER — Inpatient Hospital Stay (HOSPITAL_COMMUNITY): Payer: Medicare HMO

## 2016-02-11 LAB — GLUCOSE, CAPILLARY
Glucose-Capillary: 131 mg/dL — ABNORMAL HIGH (ref 65–99)
Glucose-Capillary: 121 mg/dL — ABNORMAL HIGH (ref 65–99)
Glucose-Capillary: 151 mg/dL — ABNORMAL HIGH (ref 65–99)
Glucose-Capillary: 120 mg/dL — ABNORMAL HIGH (ref 65–99)
Glucose-Capillary: 131 mg/dL — ABNORMAL HIGH (ref 65–99)

## 2016-02-11 MED ORDER — DIAZEPAM 5 MG PO TABS
5.0000 mg | ORAL_TABLET | Freq: Once | ORAL | Status: AC
  Administered 2016-02-11: 5 mg via ORAL
  Filled 2016-02-11: qty 1

## 2016-02-11 MED ORDER — OXYCODONE-ACETAMINOPHEN 5-325 MG PO TABS: 1.0000 | ORAL_TABLET | Freq: Four times a day (QID) | ORAL | Status: DC | PRN

## 2016-02-11 NOTE — Discharge Summary (Signed)
Discharge Summary     Jill Higgins 08-Mar-1946 70 y.o. female  [id number]  Admission Date: 02/08/2016  Discharge Date: 02/12/16  Physician: Elam Dutch, MD  Admission Diagnosis: Peripheral vascular disease with right lower extremity claudication I70.211   HPI:   This is a 70 y.o. female who presents for evaluation of Right leg claudication. The patient has a known right superficial femoral artery occlusion from several years ago. She has been trying to manage this with walking alone. However, she recently retired and wishes to have revascularization. She is fairly limited in her walking distance. She also has several small operation she wanted to have on her foot and wanted to make sure she had adequate blood supply for healing. She has no problems in her left leg.Other medical problems include Hypertension, hyperlipidemia , fibromyalgia, diabetes all of which are currently stable. She underwent left femoral to above-knee popliteal bypass in 2014. She currently is on aspirin. Recent arteriogram shows anatomically speaking she would be a candidate for right femoral to above-knee popliteal bypass. At the time of that arteriogram she underwent angioplasty of the distal anastomosis of her previously placed left femoral to above-knee popliteal bypass.  Hospital Course:  The patient was admitted to the hospital and taken to the operating room on 02/08/2016 and underwent: Right common femoral endarterectomy with profundaplasty, right femoral to above-knee popliteal bypass reversed right greater saphenous vein    The pt tolerated the procedure well and was transported to the PACU in good condition.   By POD 1, she had an easily palpable right DP pulse and incisions were healing well.  She did have acute blood loss anemia and was tolerating.  Her beta blocker was held due to bradycardia.  She was transferred to the telemetry floor that day.  Per Dr. Oneida Alar, her beta blocker was  restarted at discharged.  By POD 2, she was having some leg swelling.  She did have a palpable right DP pulse.  Main complaint is with neck and right arm numbness tingling. Has known cervical spine degeneration. MRI several years ago showed multilevel disease. Has seen Dr Rodell Perna for this in the past. We will arrange for outpt follow up. I called and got her an appt on Wednesday March 29 at 945 am  Will get MRI neck since last exam was around 4 yrs ago.  She was taken down twice for this but refused due to anxiety.   ABI's on 02/10/16 are as follows:  RIGHT    LEFT    PRESSURE WAVEFORM  PRESSURE WAVEFORM  BRACHIAL 126 Triphasic BRACHIAL 125 Triphasic  DP 93 Biphasic DP 113 Triphasic  PT 96 Biphasic PT 102 Triphasic    RIGHT LEFT  ABI 0.76 0.90         The remainder of the hospital course consisted of increasing mobilization and increasing intake of solids without difficulty.  CBC    Component Value Date/Time   WBC 6.7 02/09/2016 0440   RBC 3.58* 02/09/2016 0440   HGB 10.4* 02/09/2016 0440   HCT 33.0* 02/09/2016 0440   PLT 239 02/09/2016 0440   MCV 92.2 02/09/2016 0440   MCH 29.1 02/09/2016 0440   MCHC 31.5 02/09/2016 0440   RDW 13.9 02/09/2016 0440   LYMPHSABS 3.5 04/02/2015 0500   MONOABS 0.4 04/02/2015 0500   EOSABS 0.2 04/02/2015 0500   BASOSABS 0.0 04/02/2015 0500    BMET    Component Value Date/Time   NA 138 02/09/2016 0440   K  3.6 02/09/2016 0440   CL 107 02/09/2016 0440   CO2 26 02/09/2016 0440   GLUCOSE 139* 02/09/2016 0440   BUN 6 02/09/2016 0440   CREATININE 0.77 02/09/2016 0440   CALCIUM 8.6* 02/09/2016 0440   GFRNONAA >60 02/09/2016 0440   GFRAA >60 02/09/2016 0440     Discharge Instructions    Call MD for:  redness, tenderness, or signs of infection (pain, swelling, bleeding, redness, odor or green/yellow discharge around incision site)    Complete by:  As directed      Call MD for:  severe or  increased pain, loss or decreased feeling  in affected limb(s)    Complete by:  As directed      Call MD for:  temperature >100.5    Complete by:  As directed      Discharge wound care:    Complete by:  As directed   Wash the groin wound with soap and water daily and pat dry. (No tub bath-only shower)  Then put a dry gauze or washcloth there to keep this area dry daily and as needed.  Do not use Vaseline or neosporin on your incisions.  Only use soap and water on your incisions and then protect and keep dry.     Driving Restrictions    Complete by:  As directed   No driving for 2 weeks     Increase activity slowly    Complete by:  As directed   Walk with assistance use walker or cane as needed     Lifting restrictions    Complete by:  As directed   No lifting for 4 weeks     Resume previous diet    Complete by:  As directed            Discharge Diagnosis:  Peripheral vascular disease with right lower extremity claudication I70.211  Secondary Diagnosis: Patient Active Problem List   Diagnosis Date Noted  . PAD (peripheral artery disease) ([place]) 02/08/2016  . Leg edema, left 12/18/2013  . Aftercare following surgery of the circulatory system, [place] 11/19/2013  . Ischemic leg 10/21/2013  . Peripheral vascular disease, unspecified ([place]) 09/18/2013  . Preop cardiovascular exam 09/08/2013  . Hypertension   . Hyperlipidemia   . Peripheral vascular disease ([place])   . Fibromyalgia   . GERD (gastroesophageal reflux disease)   . Depression with anxiety   . Pain in limb 04/03/2013  . Atherosclerosis of native arteries of the extremities with intermittent claudication 08/01/2012  . Claudication ([place]) 12/28/2011   Past Medical History  Diagnosis Date  . Tobacco abuse   . Hypertension   . Hyperlipidemia   . Peripheral vascular disease ([place])   . GERD (gastroesophageal reflux disease)   . Depression with anxiety   . Diabetes mellitus     Type II  . Bronchitis   . Bulging disc      in neck  . Pneumonia   . Overactive bladder   . Fibromyalgia     pt. denies  . Sleep apnea   . History of bronchitis   . Urinary frequency   . Glaucoma        Medication List    TAKE these medications        ALEVE 220 MG tablet  Generic drug:  naproxen sodium  Take 220 mg by mouth 2 (two) times daily as needed (for pain).     aspirin EC 81 MG tablet  Take 81 mg by mouth daily.  atenolol 50 MG tablet  Commonly known as:  TENORMIN  Take 50 mg by mouth daily.     AZOR 5-40 MG tablet  Generic drug:  amLODipine-olmesartan  Take 1 tablet by mouth daily.     bismuth subsalicylate [id number] suspension  Commonly known as:  PEPTO BISMOL  Take 30 mLs by mouth every 6 (six) hours as needed for indigestion or diarrhea or loose stools. Reported on 11/04/2015     CARAFATE 1 GM/10ML suspension  Generic drug:  sucralfate  Take 10 mLs by mouth 4 (four) times daily as needed. (ACID REFLUX)     lovastatin 20 MG tablet  Commonly known as:  MEVACOR  Take 20 mg by mouth every morning.     meloxicam 15 MG tablet  Commonly known as:  MOBIC  Take 15 mg by mouth daily.     metFORMIN 1000 MG tablet  Commonly known as:  GLUCOPHAGE  Take 1,000 mg by mouth 2 (two) times daily with a meal.     NON FORMULARY  Take 2 tablets by mouth daily. Hair Infinity     oxyCODONE-acetaminophen 5-325 MG tablet  Commonly known as:  PERCOCET/ROXICET  Take 1 tablet by mouth every 6 (six) hours as needed for severe pain.     pantoprazole 40 MG tablet  Commonly known as:  PROTONIX  Take 40 mg by mouth daily.     PROAIR HFA 108 (90 Base) MCG/ACT inhaler  Generic drug:  albuterol  Inhale 2 puffs into the lungs every 4 (four) hours as needed for wheezing or shortness of breath. Reported on 11/04/2015     temazepam 30 MG capsule  Commonly known as:  RESTORIL  Take 30 mg by mouth at bedtime.     TRAVATAN Z 0.004 % Soln ophthalmic solution  Generic drug:  Travoprost (BAK Free)  Place 1 drop into  both eyes at bedtime.        Prescriptions given: 1.  Percocet #30 No Refill  Instructions: 1.  Wash the groin wound with soap and water daily and pat dry. (No tub bath-only shower)  Then put a dry gauze or washcloth there to keep this area dry daily and as needed.  Do not use Vaseline or neosporin on your incisions.  Only use soap and water on your incisions and then protect and keep dry.  Disposition: home  Patient's condition: is Good  Follow up: 1. Dr. Oneida Alar in 2 weeks 2. Dr. Lorin Mercy February 16, 2016 @ [place], PA-C [facility] [phone] 02/11/2016  3:17 PM  - For VQI Registry use --- Instructions: Press F2 to tab through selections.  Delete question if not applicable.   Post-op:  Wound infection: No  Graft infection: No  Transfusion: No  If yes, n/a units given New Arrhythmia: No Ipsilateral amputation: No, [ ]  Minor, [ ]  BKA, [ ]  AKA Discharge patency: [x ] Primary, [ ]  Primary assisted, [ ]  Secondary, [ ]  Occluded Patency judged by: [ ]  Dopper only, [ ]  Palpable graft pulse, []  Palpable distal pulse, [x ] ABI inc. > 0.15, [ ]  Duplex Discharge ABI: R 0.76, L 0.90 D/C Ambulatory Status: Ambulatory  Complications: MI: No, [ ]  Troponin only, [ ]  EKG or Clinical CHF: No Resp failure:No, [ ]  Pneumonia, [ ]  Ventilator Chg in renal function: No, [ ]  Inc. Cr > 0.5, [ ]  Temp. Dialysis, [ ]  Permanent dialysis Stroke: No, [ ]  Minor, [ ]  Major Return to OR:  No  Reason for return to OR: [ ]  Bleeding, [ ]  Infection, [ ]  Thrombosis, [ ]  Revision  Discharge medications: Statin use:  yes ASA use:  yes Plavix use:  no Beta blocker use: yes ACEI use:   no ARB use:  no Coumadin use: no

## 2016-02-11 NOTE — Progress Notes (Addendum)
[facility] [place]  Subjective  - Still having pain with incision.   Objective 124/66 65 98 F (36.7 C) (Oral) 16 95% No intake or output data in the 24 hours ending 02/11/16 0740  Right DP palpable Min-mod edema in the right LE, groin is soft Incisions healing well Lungs non labored breathing RA Heart RRR  Assessment/Planning: POD # 3 Right common femoral endarterectomy with profundaplasty, right femoral to above-knee popliteal bypass reversed right greater saphenous vein    Pain control issues PT/OT for mobility Thinks home tomorrow pending how she feels today  Laurence Slate [facility] 02/11/2016 7:40 AM -- Leg with some swelling.  2+ DP pulse Main complaint is with neck and right arm numbness tingling.  Has known cervical spine degeneration.  MRI several years ago showed multilevel disease.  Has seen Dr Rodell Perna for this in the past.  We will arrange for outpt follow up.  I called and got her an appt on Wednesday March 29 at 945 am  Will get MRI neck since last exam was around 4 yrs ago.  Plan d/c home tomorrow.  Ruta Hinds, MD [facility] [place] Office: [phone] Pager: [phone]  Laboratory Lab Results:  Recent Labs  02/08/16 2030 02/09/16 0440  WBC 8.8 6.7  HGB 10.6* 10.4*  HCT 33.3* 33.0*  PLT 248 239   BMET  Recent Labs  02/08/16 2030 02/09/16 0440  NA  --  138  K  --  3.6  CL  --  107  CO2  --  26  GLUCOSE  --  139*  BUN  --  6  CREATININE 0.76 0.77  CALCIUM  --  8.6*    COAG Lab Results  Component Value Date   INR 0.97 02/03/2016   INR 0.88 09/29/2013   INR 0.9 05/14/2008   No results found for: PTT

## 2016-02-11 NOTE — Progress Notes (Signed)
RN paged Dr.Fields related to pt needing anxiety medication for MRI this evening. MD returned page and new order given for 5mg  Valium once. RN will follow orders and continue to monitor.   Arnell Sieving. RN

## 2016-02-11 NOTE — Progress Notes (Signed)
Physical Therapy Treatment Patient Details Name: Jill Higgins MRN: [id number] DOB: December 11, 1945 Today's Date: 02/11/2016    History of Present Illness 78 female s/p BYPASS GRAFT FEMORAL-POPLITEAL ARTERY (Right) PMH: HTN, PVD, Fibromyalgia, DM 2, depression with anxiety, bulging neck dis, overactive bladder, aortogram, hand surg, L femoral- popliteal artery     PT Comments    Pt performed increased gait but remains limited due to pain in neck and radiating to R arm.  Pt also reports pain in R groin at incision site.    Follow Up Recommendations  Home health PT;Supervision for mobility/OOB     Equipment Recommendations  None recommended by PT    Recommendations for Other Services       Precautions / Restrictions Precautions Precautions: Fall Restrictions Weight Bearing Restrictions: No    Mobility  Bed Mobility Overal bed mobility: Needs Assistance Bed Mobility: Supine to Sit;Sit to Sidelying     Supine to sit: Supervision (Pt slow to ascend from supine surface, secondary to pain, mulitple LOB posterior and lateral able to self correct.  )   Sit to sidelying: Min assist (to lift and position LEs into bed.  ) General bed mobility comments: use of rail, extra time, HOB down  Transfers Overall transfer level: Needs assistance Equipment used: None Transfers: Sit to/from Stand Sit to Stand: Supervision (Pt demonstrates poor eccentric loading secondary to pain.  )         General transfer comment: increased time, guards R LE with sit to stand  Ambulation/Gait Ambulation/Gait assistance: Min guard Ambulation Distance (Feet): 340 Feet Assistive device: None Gait Pattern/deviations: Step-through pattern;Shuffle;Decreased stride length;Decreased weight shift to right;Decreased weight shift to left;Antalgic   Gait velocity interpretation: Below normal speed for age/gender General Gait Details: Cues for reciprocal armswing and to increase B step length.  Pt with slow steady  gait.     Stairs Stairs:  (pt refused secondary to pain.  )          Wheelchair Mobility    Modified Rankin (Stroke Patients Only)       Balance Overall balance assessment: Needs assistance   Sitting balance-Leahy Scale: Good       Standing balance-Leahy Scale: Fair                      Cognition Arousal/Alertness: Awake/alert Behavior During Therapy: WFL for tasks assessed/performed Overall Cognitive Status: Within Functional Limits for tasks assessed                      Exercises      General Comments        Pertinent Vitals/Pain Pain Assessment: 0-10 Pain Score: 7  Pain Location: Reports Pain in R side of neck that radiates to R arm, reports 7/10, Pt reports 5/10 in R groin at incision site.   Pain Descriptors / Indicators: Operative site guarding;Grimacing;Guarding;Sharp;Radiating Pain Intervention(s): Monitored during session;Patient requesting pain meds-RN notified;Repositioned    Home Living                      Prior Function            PT Goals (current goals can now be found in the care plan section) Acute Rehab PT Goals Patient Stated Goal: none stated Potential to Achieve Goals: Good Progress towards PT goals: Progressing toward goals    Frequency  Min 3X/week    PT Plan      Co-evaluation  End of Session Equipment Utilized During Treatment: Gait belt Activity Tolerance: Patient limited by pain;Patient tolerated treatment well Patient left: with call bell/phone within reach;in bed     Time: 0926-0944 PT Time Calculation (min) (ACUTE ONLY): 18 min  Charges:  $Gait Training: 8-22 mins                    G Codes:      Cristela Blue March 05, 2016, 9:56 AM  Governor Rooks, PTA pager [phone]

## 2016-02-12 DIAGNOSIS — E1151 Type 2 diabetes mellitus with diabetic peripheral angiopathy without gangrene: Secondary | ICD-10-CM | POA: Diagnosis not present

## 2016-02-12 LAB — GLUCOSE, CAPILLARY
Glucose-Capillary: 116 mg/dL — ABNORMAL HIGH (ref 65–99)
Glucose-Capillary: 116 mg/dL — ABNORMAL HIGH (ref 65–99)

## 2016-02-12 NOTE — Progress Notes (Addendum)
Pt unable to have MRI done tonight. Despite Valium given before procedure pt stated she still felt claustrophobic and she felt like she needed to vomit. Pt also had difficulty staying still during the procedure because of neck pain( she was given Morphine prior to leaving the floor).  Arnell Sieving, RN

## 2016-02-12 NOTE — Progress Notes (Signed)
[facility]  Subjective  - feels ok, could not do MRI due to anxiety   Objective 105/64 65 98.5 F (36.9 C) (Oral) 17 99%  Intake/Output Summary (Last 24 hours) at 02/12/16 0926 Last data filed at 02/11/16 1300  Gross per 24 hour  Intake    120 ml  Output      0 ml  Net    120 ml   2+ DP bilaterally, right leg incisions healing  Assessment/Planning: Doing well post fem pop Cervical spine disease stable.  Has appt with Dr Lorin Mercy next week.  Can defer MRI for now D/c home  Jill Higgins 02/12/2016 9:26 AM --  Laboratory Lab Results: No results for input(s): WBC, HGB, HCT, PLT in the last 72 hours. BMET No results for input(s): NA, K, CL, CO2, GLUCOSE, BUN, CREATININE, CALCIUM in the last 72 hours.  COAG Lab Results  Component Value Date   INR 0.97 02/03/2016   INR 0.88 09/29/2013   INR 0.9 05/14/2008   No results found for: PTT

## 2016-02-15 ENCOUNTER — Encounter: Payer: Self-pay | Admitting: Vascular Surgery

## 2016-02-16 DIAGNOSIS — Z716 Tobacco abuse counseling: Secondary | ICD-10-CM | POA: Diagnosis not present

## 2016-02-16 DIAGNOSIS — F17210 Nicotine dependence, cigarettes, uncomplicated: Secondary | ICD-10-CM | POA: Diagnosis not present

## 2016-02-16 DIAGNOSIS — M542 Cervicalgia: Secondary | ICD-10-CM | POA: Diagnosis not present

## 2016-02-18 ENCOUNTER — Other Ambulatory Visit: Payer: Self-pay | Admitting: Orthopaedic Surgery

## 2016-02-18 DIAGNOSIS — M542 Cervicalgia: Secondary | ICD-10-CM

## 2016-02-22 ENCOUNTER — Encounter: Payer: Self-pay | Admitting: Vascular Surgery

## 2016-02-23 ENCOUNTER — Encounter: Payer: Self-pay | Admitting: Vascular Surgery

## 2016-02-23 ENCOUNTER — Ambulatory Visit (INDEPENDENT_AMBULATORY_CARE_PROVIDER_SITE_OTHER): Payer: Medicare Other | Admitting: Vascular Surgery

## 2016-02-23 VITALS — BP 118/75 | HR 54 | Temp 97.8°F | Resp 16 | Ht 63.5 in | Wt 149.3 lb

## 2016-02-23 DIAGNOSIS — Z48812 Encounter for surgical aftercare following surgery on the circulatory system: Secondary | ICD-10-CM

## 2016-02-23 DIAGNOSIS — Z95828 Presence of other vascular implants and grafts: Secondary | ICD-10-CM

## 2016-02-23 DIAGNOSIS — I739 Peripheral vascular disease, unspecified: Secondary | ICD-10-CM

## 2016-02-23 MED ORDER — OXYCODONE-ACETAMINOPHEN 5-325 MG PO TABS: 1.0000 | ORAL_TABLET | Freq: Four times a day (QID) | ORAL | Status: DC | PRN

## 2016-02-23 NOTE — Progress Notes (Signed)
Filed Vitals:   02/23/16 1528 02/23/16 1532  BP: 138/78 118/75  Pulse: 54   Temp: 97.8 F (36.6 C)   TempSrc: Oral   Resp: 16   Height: 5' 3.5" (1.613 m)   Weight: 149 lb 4.8 oz (67.722 kg)

## 2016-02-23 NOTE — Progress Notes (Signed)
Patient is a 70 year old female who returns for postoperative follow-up today after recent right femoral to above-knee popliteal bypass with vein on 02/08/2016. She still has a fair amount of swelling in the right leg. She denies any incisional drainage. She still has some pain in both of her incisions in the right lateral aspect of her leg. She is also still bothered by pain in her neck and is under evaluation by Dr. Lorin Mercy for this. She does not really describe any claudication symptoms but is not really walking enough to elicit these currently.  Physical exam:  Filed Vitals:   02/23/16 1528 02/23/16 1532  BP: 138/78 118/75  Pulse: 54   Temp: 97.8 F (36.6 C)   TempSrc: Oral   Resp: 16   Height: 5' 3.5" (1.613 m)   Weight: 149 lb 4.8 oz (67.722 kg)     Left lower extremity: 2+ dorsalis pedis pulse  Right lower extremity: Healing incisions vaguely palpable dorsalis pedis pulse right foot difficult to palpate secondary to edema foot is pink and warm  Assessment: Healing right lower extremity status post right femoral to above-knee popliteal bypass with patent bypass graft. Patent bypass graft left leg from prior femoropopliteal bypass in her plan: Patient was given a renewal of her Percocet prescription #20 dispensed no further refills after this. She will follow-up with repeat duplex graft of both femoropopliteal some bilateral ABIs in 3 months.  Ruta Hinds, MD [facility] Office: [phone] Pager: [phone]

## 2016-02-26 ENCOUNTER — Ambulatory Visit
Admission: RE | Admit: 2016-02-26 | Discharge: 2016-02-26 | Disposition: A | Discharge status: Home / Self Care | Payer: Medicare Other | Source: Ambulatory Visit | Attending: Orthopaedic Surgery | Admitting: Orthopaedic Surgery

## 2016-02-26 DIAGNOSIS — M50222 Other cervical disc displacement at C5-C6 level: Secondary | ICD-10-CM | POA: Diagnosis not present

## 2016-02-26 DIAGNOSIS — M542 Cervicalgia: Secondary | ICD-10-CM

## 2016-02-29 DIAGNOSIS — M542 Cervicalgia: Secondary | ICD-10-CM | POA: Diagnosis not present

## 2016-03-01 ENCOUNTER — Other Ambulatory Visit: Payer: Self-pay | Admitting: Orthopaedic Surgery

## 2016-03-01 DIAGNOSIS — M25511 Pain in right shoulder: Secondary | ICD-10-CM

## 2016-03-05 ENCOUNTER — Ambulatory Visit
Admission: RE | Admit: 2016-03-05 | Discharge: 2016-03-05 | Disposition: A | Discharge status: Home / Self Care | Payer: Medicare Other | Source: Ambulatory Visit | Attending: Orthopaedic Surgery | Admitting: Orthopaedic Surgery

## 2016-03-05 DIAGNOSIS — M75111 Incomplete rotator cuff tear or rupture of right shoulder, not specified as traumatic: Secondary | ICD-10-CM | POA: Diagnosis not present

## 2016-03-05 DIAGNOSIS — M25511 Pain in right shoulder: Secondary | ICD-10-CM

## 2016-03-09 ENCOUNTER — Telehealth: Payer: Self-pay

## 2016-03-09 NOTE — Telephone Encounter (Signed)
Phone call from pt. Reported she needs a refill on her pain medication.  Reported she has "some" incisional pain.  Stated most of the pain is in the "front of the lower right leg, from below the knee to the ankle."  Reported 3 open areas on right leg incision; stated "they opened up after the stitches came out."  Reported small amt. Of bloody drainage.  C/o swelling of the right lower leg; reported she wears compression hose, and lays down frequently to elevate her legs.  Advised will schedule office appt. For incision check and to evaluate for refill on narcotic pain medication.  Appt. Given for 11:15 AM 03/10/16.  Agreed with plan.

## 2016-03-10 ENCOUNTER — Encounter: Payer: Self-pay | Admitting: Family

## 2016-03-10 ENCOUNTER — Ambulatory Visit (INDEPENDENT_AMBULATORY_CARE_PROVIDER_SITE_OTHER): Payer: Medicare Other | Admitting: Family

## 2016-03-10 VITALS — BP 133/81 | HR 56 | Temp 97.2°F | Ht 63.5 in | Wt 148.3 lb

## 2016-03-10 DIAGNOSIS — I739 Peripheral vascular disease, unspecified: Secondary | ICD-10-CM

## 2016-03-10 DIAGNOSIS — M7989 Other specified soft tissue disorders: Secondary | ICD-10-CM

## 2016-03-10 DIAGNOSIS — M25511 Pain in right shoulder: Secondary | ICD-10-CM | POA: Diagnosis not present

## 2016-03-10 DIAGNOSIS — Z95828 Presence of other vascular implants and grafts: Secondary | ICD-10-CM

## 2016-03-10 DIAGNOSIS — M79661 Pain in right lower leg: Secondary | ICD-10-CM

## 2016-03-10 DIAGNOSIS — M79604 Pain in right leg: Secondary | ICD-10-CM

## 2016-03-10 MED ORDER — GABAPENTIN 300 MG PO CAPS: 300.0000 mg | ORAL_CAPSULE | Freq: Every day | ORAL | Status: DC

## 2016-03-10 MED ORDER — TRAMADOL HCL 50 MG PO TABS: 50.0000 mg | ORAL_TABLET | Freq: Four times a day (QID) | ORAL | Status: DC | PRN

## 2016-03-10 NOTE — Patient Instructions (Signed)
Peripheral Vascular Disease Peripheral vascular disease (PVD) is a disease of the blood vessels that are not part of your heart and brain. A simple term for PVD is poor circulation. In most cases, PVD narrows the blood vessels that carry blood from your heart to the rest of your body. This can result in a decreased supply of blood to your arms, legs, and internal organs, like your stomach or kidneys. However, it most often affects a person's lower legs and feet. There are two types of PVD.  Organic PVD. This is the more common type. It is caused by damage to the structure of blood vessels.  Functional PVD. This is caused by conditions that make blood vessels contract and tighten (spasm). Without treatment, PVD tends to get worse over time. PVD can also lead to acute ischemic limb. This is when an arm or limb suddenly has trouble getting enough blood. This is a medical emergency. CAUSES Each type of PVD has many different causes. The most common cause of PVD is buildup of a fatty material (plaque) inside of your arteries (atherosclerosis). Small amounts of plaque can break off from the walls of the blood vessels and become lodged in a smaller artery. This blocks blood flow and can cause acute ischemic limb. Other common causes of PVD include:  Blood clots that form inside of blood vessels.  Injuries to blood vessels.  Diseases that cause inflammation of blood vessels or cause blood vessel spasms.  Health behaviors and health history that increase your risk of developing PVD. RISK FACTORS  You may have a greater risk of PVD if you:  Have a family history of PVD.  Have certain medical conditions, including:  High cholesterol.  Diabetes.  High blood pressure (hypertension).  Coronary heart disease.  Past problems with blood clots.  Past injury, such as burns or a broken bone. These may have damaged blood vessels in your limbs.  Buerger disease. This is caused by inflamed blood  vessels in your hands and feet.  Some forms of arthritis.  Rare birth defects that affect the arteries in your legs.  Use tobacco.  Do not get enough exercise.  Are obese.  Are age 50 or older. SIGNS AND SYMPTOMS  PVD may cause many different symptoms. Your symptoms depend on what part of your body is not getting enough blood. Some common signs and symptoms include:  Cramps in your lower legs. This may be a symptom of poor leg circulation (claudication).  Pain and weakness in your legs while you are physically active that goes away when you rest (intermittent claudication).  Leg pain when at rest.  Leg numbness, tingling, or weakness.  Coldness in a leg or foot, especially when compared with the other leg.  Skin or hair changes. These can include:  Hair loss.  Shiny skin.  Pale or bluish skin.  Thick toenails.  Inability to get or maintain an erection (erectile dysfunction). People with PVD are more prone to developing ulcers and sores on their toes, feet, or legs. These may take longer than normal to heal. DIAGNOSIS Your health care provider may diagnose PVD from your signs and symptoms. The health care provider will also do a physical exam. You may have tests to find out what is causing your PVD and determine its severity. Tests may include:  Blood pressure recordings from your arms and legs and measurements of the strength of your pulses (pulse volume recordings).  Imaging studies using sound waves to take pictures of   the blood flow through your blood vessels (Doppler ultrasound).  Injecting a dye into your blood vessels before having imaging studies using:  X-rays (angiogram or arteriogram).  Computer-generated X-rays (CT angiogram).  A powerful electromagnetic field and a computer (magnetic resonance angiogram or MRA). TREATMENT Treatment for PVD depends on the cause of your condition and the severity of your symptoms. It also depends on your age. Underlying  causes need to be treated and controlled. These include long-lasting (chronic) conditions, such as diabetes, high cholesterol, and high blood pressure. You may need to first try making lifestyle changes and taking medicines. Surgery may be needed if these do not work. Lifestyle changes may include:  Quitting smoking.  Exercising regularly.  Following a low-fat, low-cholesterol diet. Medicines may include:  Blood thinners to prevent blood clots.  Medicines to improve blood flow.  Medicines to improve your blood cholesterol levels. Surgical procedures may include:  A procedure that uses an inflated balloon to open a blocked artery and improve blood flow (angioplasty).  A procedure to put in a tube (stent) to keep a blocked artery open (stent implant).  Surgery to reroute blood flow around a blocked artery (peripheral bypass surgery).  Surgery to remove dead tissue from an infected wound on the affected limb.  Amputation. This is surgical removal of the affected limb. This may be necessary in cases of acute ischemic limb that are not improved through medical or surgical treatments. HOME CARE INSTRUCTIONS  Take medicines only as directed by your health care provider.  Do not use any tobacco products, including cigarettes, chewing tobacco, or electronic cigarettes. If you need help quitting, ask your health care provider.  Lose weight if you are overweight, and maintain a healthy weight as directed by your health care provider.  Eat a diet that is low in fat and cholesterol. If you need help, ask your health care provider.  Exercise regularly. Ask your health care provider to suggest some good activities for you.  Use compression stockings or other mechanical devices as directed by your health care provider.  Take good care of your feet.  Wear comfortable shoes that fit well.  Check your feet often for any cuts or sores. SEEK MEDICAL CARE IF:  You have cramps in your legs  while walking.  You have leg pain when you are at rest.  You have coldness in a leg or foot.  Your skin changes.  You have erectile dysfunction.  You have cuts or sores on your feet that are not healing. SEEK IMMEDIATE MEDICAL CARE IF:  Your arm or leg turns cold and blue.  Your arms or legs become red, warm, swollen, painful, or numb.  You have chest pain or trouble breathing.  You suddenly have weakness in your face, arm, or leg.  You become very confused or lose the ability to speak.  You suddenly have a very bad headache or lose your vision.   This information is not intended to replace advice given to you by your health care provider. Make sure you discuss any questions you have with your health care provider.   Document Released: 12/14/2004 Document Revised: 11/27/2014 Document Reviewed: 04/16/2014 Elsevier Interactive Patient Education 2016 [employer]  

## 2016-03-10 NOTE — Progress Notes (Signed)
Postoperative Visit   History of Present Illness  Jill Higgins is a 70 y.o. year old female patient of Dr. Oneida Alar who is s/p right femoral to above-knee popliteal bypass with vein on 02/08/2016.  She is also status post left femoral to above-knee popliteal bypass with non-reversed greater saphenous vein on 10/21/13.  Pt last saw Dr. Oneida Alar on 02/23/16. At that time pt still had a fair amount of swelling in the right leg. She denies any incisional drainage. She still has some pain in both of her incisions in the right lateral aspect of her leg. She is also still bothered by pain in her neck and is under evaluation by Dr. Lorin Mercy for this. She does not really describe any claudication symptoms but is not really walking enough to elicit these currently. Healing right lower extremity status post right femoral to above-knee popliteal bypass with patent bypass graft. Patent bypass graft left leg from prior femoropopliteal bypass in her plan: Patient was given a renewal of her Percocet prescription #20 dispensed no further refills after this. She will follow-up with repeat duplex graft of both femoropopliteal some bilateral ABIs in 3 months.  She returns today after phone call from pt yesterday. Reported she needs a refill on her pain medication. Reported she has "some" incisional pain. Stated most of the pain is in the "front of the lower right leg, from below the knee to the ankle." Reported 3 open areas on right leg incision; stated "they opened up after the stitches came out." Reported small amt. Of bloody drainage. C/o swelling of the right lower leg; reported she wears compression hose, and lays down frequently to elevate her legs.  Pt has 1-2+ non pitting edema in right lower leg and right knee level, 1+ non pitting edema in right thigh. Small open areas of separation of incisions are granulating well, no erythema, no drainage.  Pt Diabetic: Yes, states in good control, states her A1C was  5.5 Pt smoker: former smoker, quit Nov., 2014, using e-cigarettes now  Pt meds include: Statin :Yes Betablocker: Yes ASA: Yes Other anticoagulants/antiplatelets: no     For VQI Use Only  PRE-ADM LIVING: Home  AMB STATUS: Ambulatory  Physical Examination  Filed Vitals:   03/10/16 1112  BP: 133/81  Pulse: 56  Temp: 97.2 F (36.2 C)  TempSrc: Oral  Height: 5' 3.5" (1.613 m)  Weight: 148 lb 4.8 oz (67.268 kg)  SpO2: 96%   Body mass index is 25.85 kg/(m^2).  RLE: Incisions are healing, small openings in right leg incision are granulating well, pedal pulses: faintly palpable right DP but strong Doppler signal in right DP and PT.    Medical Decision Making  Jill Higgins is a 70 y.o. year old female who presents s/p right femoral to above-knee popliteal bypass with vein on 02/08/2016.  She is also status post left femoral to above-knee popliteal bypass with non-reversed greater saphenous vein on 10/21/13. She returns today with c/o swelling and pain in right lower leg. Incisions of right leg and groin are healing well, small opening at distal aspect of right leg incision is granulating well; slight openings of incision at medial aspect right thigh is likely from swelling of the leg. It appears that pt is not elevating right foot above her heart adequately; she was instructed how to to this, and to do this overnight and for at least 20 minute, at least 3x during the day, more is better. She is also encouraged to  walk. Dr. Bridgett Larsson spoke with and examined pt. Tramadol 50 mg every 6 hours prn pain, disp #20, 0 refills. Gabapentin 300 mg qhs for neuropathy type pain, disp #30, 0 refills. Follow up with Dr. Oneida Alar in 2 weeks for evaluation of right leg and incision.   NICKEL, Jill Leyden, RN, MSN, FNP-C [facility] Office: [phone]  03/10/2016, 11:51 AM  Clinic MD: Bridgett Larsson

## 2016-03-17 ENCOUNTER — Encounter: Payer: Self-pay | Admitting: Vascular Surgery

## 2016-03-23 ENCOUNTER — Encounter: Payer: Self-pay | Admitting: Vascular Surgery

## 2016-03-23 ENCOUNTER — Ambulatory Visit (INDEPENDENT_AMBULATORY_CARE_PROVIDER_SITE_OTHER): Payer: Medicare Other | Admitting: Vascular Surgery

## 2016-03-23 VITALS — BP 131/84 | HR 60 | Ht 63.5 in | Wt 146.0 lb

## 2016-03-23 DIAGNOSIS — I739 Peripheral vascular disease, unspecified: Secondary | ICD-10-CM

## 2016-03-23 MED ORDER — GABAPENTIN 300 MG PO CAPS: 300.0000 mg | ORAL_CAPSULE | Freq: Three times a day (TID) | ORAL | Status: DC

## 2016-03-23 NOTE — Progress Notes (Signed)
Patient is a 70 year old female who returns for postoperative follow-up today after recent right femoral to above-knee popliteal bypass with vein on 02/08/2016. She still has a fair amount of swelling in the right leg but this is improved. She denies any incisional drainage. She still has some pain in both of her incisions in the right lateral aspect of her leg. She is also still bothered by pain in her neck and is under evaluation by Dr. Lorin Mercy for this. She does not really describe any claudication symptoms but is not really walking enough to elicit these currently. She is currently taking 100 mg of Neurontin each evening.  Physical exam:    Filed Vitals:   03/23/16 0929 03/23/16 0934  BP: 149/79 131/84  Pulse: 60   Height: 5' 3.5" (1.613 m)   Weight: 146 lb (66.225 kg)   SpO2: 99%     Left lower extremity: 2+ dorsalis pedis pulse  Right lower extremity: Healing incisions vaguely palpable 2+ dorsalis pedis pulse right foot still some edema foot is pink and warm  Assessment: Healing right lower extremity status post right femoral to above-knee popliteal bypass with patent bypass graft. Patent bypass graft left leg from prior femoropopliteal bypass  Plan: Neurontin was increased to 300 mg 3 times a day. 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 She will follow-up with me in June with repeat ABIs and graft duplexes of both bypasses. Ruta Hinds, MD [facility] Office: [phone] Pager: [phone]

## 2016-03-28 ENCOUNTER — Other Ambulatory Visit: Payer: Self-pay

## 2016-03-28 DIAGNOSIS — Z1231 Encounter for screening mammogram for malignant neoplasm of breast: Secondary | ICD-10-CM

## 2016-03-29 ENCOUNTER — Telehealth: Payer: Self-pay

## 2016-03-29 DIAGNOSIS — M792 Neuralgia and neuritis, unspecified: Secondary | ICD-10-CM

## 2016-03-29 NOTE — Telephone Encounter (Signed)
rec'd call from pt.  Had been taking Gabapentin 100 mg q hs.  Reported she recently saw Dr. Oneida Alar, and that the dose was increased to 300 mg tid.  Reported she gets very nauseated on the Gabapentin.  Stated she had this problem on the lower dose, and was not able to tolerate it.  Denied vomiting, but stated she felt like she was on the verge of it.  Stated the nausea starts in after she takes a dose, and eventually wears off.  Is requesting an alternative medication in place of the Gabapentin.  Discussed with Dr. Oneida Alar.  Advised to start Lyrica 50 mg qd.  Recommended to advise the pt. that Lyrica will make her sleepy.  Attempted to contact pt. per phone; left v.m. re: need to call back to discuss option of taking Lyrica.

## 2016-03-30 ENCOUNTER — Encounter: Payer: Self-pay | Admitting: Pediatrics

## 2016-03-30 DIAGNOSIS — H53433 Sector or arcuate defects, bilateral: Secondary | ICD-10-CM | POA: Diagnosis not present

## 2016-03-30 DIAGNOSIS — H401133 Primary open-angle glaucoma, bilateral, severe stage: Secondary | ICD-10-CM | POA: Diagnosis not present

## 2016-03-30 DIAGNOSIS — E119 Type 2 diabetes mellitus without complications: Secondary | ICD-10-CM | POA: Diagnosis not present

## 2016-03-30 DIAGNOSIS — I739 Peripheral vascular disease, unspecified: Secondary | ICD-10-CM

## 2016-03-30 MED ORDER — PREGABALIN 50 MG PO CAPS: 50.0000 mg | ORAL_CAPSULE | Freq: Every day | ORAL | Status: DC

## 2016-03-30 NOTE — Telephone Encounter (Signed)
Pt. returned phone call.  Advised of Dr. Oneida Alar recommendation for starting Lyrica 50 mg qd.  Advised that it may make her sleepy.  Pt. Verb. Understanding.  Agreed to new medication.

## 2016-03-30 NOTE — Addendum Note (Signed)
Addended by: Dorothyann Gibbs on: 03/30/2016 10:39 AM   Modules accepted: Orders

## 2016-04-06 DIAGNOSIS — I10 Essential (primary) hypertension: Secondary | ICD-10-CM | POA: Diagnosis not present

## 2016-04-06 DIAGNOSIS — I739 Peripheral vascular disease, unspecified: Secondary | ICD-10-CM | POA: Diagnosis not present

## 2016-04-06 DIAGNOSIS — H409 Unspecified glaucoma: Secondary | ICD-10-CM | POA: Diagnosis not present

## 2016-04-06 DIAGNOSIS — J449 Chronic obstructive pulmonary disease, unspecified: Secondary | ICD-10-CM | POA: Diagnosis not present

## 2016-04-06 DIAGNOSIS — E119 Type 2 diabetes mellitus without complications: Secondary | ICD-10-CM | POA: Diagnosis not present

## 2016-04-06 DIAGNOSIS — G4700 Insomnia, unspecified: Secondary | ICD-10-CM | POA: Diagnosis not present

## 2016-04-06 DIAGNOSIS — F17200 Nicotine dependence, unspecified, uncomplicated: Secondary | ICD-10-CM | POA: Diagnosis not present

## 2016-04-06 DIAGNOSIS — Z7984 Long term (current) use of oral hypoglycemic drugs: Secondary | ICD-10-CM | POA: Diagnosis not present

## 2016-04-06 DIAGNOSIS — Z0000 Encounter for general adult medical examination without abnormal findings: Secondary | ICD-10-CM | POA: Diagnosis not present

## 2016-04-06 DIAGNOSIS — E7800 Pure hypercholesterolemia, unspecified: Secondary | ICD-10-CM | POA: Diagnosis not present

## 2016-04-07 ENCOUNTER — Ambulatory Visit
Admission: RE | Admit: 2016-04-07 | Discharge: 2016-04-07 | Disposition: A | Discharge status: Home / Self Care | Payer: Medicare Other | Source: Ambulatory Visit

## 2016-04-07 DIAGNOSIS — Z1231 Encounter for screening mammogram for malignant neoplasm of breast: Secondary | ICD-10-CM

## 2016-04-21 DIAGNOSIS — M25511 Pain in right shoulder: Secondary | ICD-10-CM | POA: Diagnosis not present

## 2016-05-19 DIAGNOSIS — M25511 Pain in right shoulder: Secondary | ICD-10-CM | POA: Diagnosis not present

## 2016-05-26 ENCOUNTER — Encounter (HOSPITAL_COMMUNITY): Payer: Medicare Other

## 2016-05-26 ENCOUNTER — Other Ambulatory Visit (HOSPITAL_COMMUNITY): Payer: Medicare Other

## 2016-05-31 DIAGNOSIS — M25571 Pain in right ankle and joints of right foot: Secondary | ICD-10-CM | POA: Diagnosis not present

## 2016-05-31 DIAGNOSIS — M7751 Other enthesopathy of right foot: Secondary | ICD-10-CM | POA: Diagnosis not present

## 2016-06-01 ENCOUNTER — Ambulatory Visit: Payer: Medicare Other | Admitting: Vascular Surgery

## 2016-06-05 DIAGNOSIS — Z961 Presence of intraocular lens: Secondary | ICD-10-CM | POA: Diagnosis not present

## 2016-06-05 DIAGNOSIS — E119 Type 2 diabetes mellitus without complications: Secondary | ICD-10-CM | POA: Diagnosis not present

## 2016-06-05 DIAGNOSIS — H401133 Primary open-angle glaucoma, bilateral, severe stage: Secondary | ICD-10-CM | POA: Diagnosis not present

## 2016-06-16 DIAGNOSIS — M7751 Other enthesopathy of right foot: Secondary | ICD-10-CM | POA: Diagnosis not present

## 2016-06-16 DIAGNOSIS — M25571 Pain in right ankle and joints of right foot: Secondary | ICD-10-CM | POA: Diagnosis not present

## 2016-06-16 DIAGNOSIS — M7752 Other enthesopathy of left foot: Secondary | ICD-10-CM | POA: Diagnosis not present

## 2016-06-22 ENCOUNTER — Encounter: Payer: Self-pay | Admitting: Vascular Surgery

## 2016-06-28 ENCOUNTER — Ambulatory Visit (INDEPENDENT_AMBULATORY_CARE_PROVIDER_SITE_OTHER)
Admission: RE | Admit: 2016-06-28 | Discharge: 2016-06-28 | Disposition: A | Discharge status: Home / Self Care | Payer: Medicare Other | Source: Ambulatory Visit | Attending: Vascular Surgery | Admitting: Vascular Surgery

## 2016-06-28 ENCOUNTER — Ambulatory Visit (HOSPITAL_COMMUNITY)
Admission: RE | Admit: 2016-06-28 | Discharge: 2016-06-28 | Disposition: A | Discharge status: Home / Self Care | Payer: Medicare Other | Source: Ambulatory Visit | Attending: Vascular Surgery | Admitting: Vascular Surgery

## 2016-06-28 DIAGNOSIS — F329 Major depressive disorder, single episode, unspecified: Secondary | ICD-10-CM | POA: Diagnosis not present

## 2016-06-28 DIAGNOSIS — F419 Anxiety disorder, unspecified: Secondary | ICD-10-CM | POA: Insufficient documentation

## 2016-06-28 DIAGNOSIS — K219 Gastro-esophageal reflux disease without esophagitis: Secondary | ICD-10-CM | POA: Diagnosis not present

## 2016-06-28 DIAGNOSIS — E785 Hyperlipidemia, unspecified: Secondary | ICD-10-CM | POA: Insufficient documentation

## 2016-06-28 DIAGNOSIS — R938 Abnormal findings on diagnostic imaging of other specified body structures: Secondary | ICD-10-CM | POA: Insufficient documentation

## 2016-06-28 DIAGNOSIS — I739 Peripheral vascular disease, unspecified: Secondary | ICD-10-CM

## 2016-06-28 DIAGNOSIS — I10 Essential (primary) hypertension: Secondary | ICD-10-CM | POA: Insufficient documentation

## 2016-06-28 DIAGNOSIS — E119 Type 2 diabetes mellitus without complications: Secondary | ICD-10-CM | POA: Diagnosis not present

## 2016-06-28 DIAGNOSIS — Z720 Tobacco use: Secondary | ICD-10-CM | POA: Insufficient documentation

## 2016-06-29 ENCOUNTER — Other Ambulatory Visit: Payer: Self-pay

## 2016-06-29 ENCOUNTER — Ambulatory Visit (INDEPENDENT_AMBULATORY_CARE_PROVIDER_SITE_OTHER): Payer: Medicare Other | Admitting: Vascular Surgery

## 2016-06-29 ENCOUNTER — Encounter: Payer: Self-pay | Admitting: Vascular Surgery

## 2016-06-29 VITALS — BP 120/80 | HR 53 | Temp 97.9°F | Resp 16 | Ht 63.0 in | Wt 141.0 lb

## 2016-06-29 DIAGNOSIS — I739 Peripheral vascular disease, unspecified: Secondary | ICD-10-CM | POA: Diagnosis not present

## 2016-06-29 NOTE — Progress Notes (Signed)
Referring Physician: Carol Ada, MD  Patient name: Jill Higgins MRN: [id number] DOB: 1946-04-01 Sex: female  HPI: Jill Higgins is a 70 y.o. female,  who is status post bilateral femoral-popliteal bypass grafts. She returns today for follow-up. She denies any symptoms of lower extremity claudication. She does still complain of numbness burning and tingling around her knee And calf area. She has previously taken Neurontin for this. She has not really noticed any difference. She denies rest pain. She has no nonhealing wounds. Other medical problems include diabetes, hyperlipidemia, hypertension all of which are currently stable.  Past Medical History:  Diagnosis Date  . Bronchitis   . Bulging disc    in neck  . Depression with anxiety   . Diabetes mellitus    Type II  . Fibromyalgia    pt. denies  . GERD (gastroesophageal reflux disease)   . Glaucoma   . History of bronchitis   . Hyperlipidemia   . Hypertension   . Overactive bladder   . Peripheral vascular disease ([place])   . Pneumonia   . Sleep apnea   . Tobacco abuse   . Urinary frequency    Past Surgical History:  Procedure Laterality Date  . ABDOMINAL AORTAGRAM N/A 08/15/2013   Procedure: ABDOMINAL Maxcine Ham;  Surgeon: Elam Dutch, MD;  Location: [facility] CATH LAB;  Service: Cardiovascular;  Laterality: N/A;  . ABDOMINAL AORTAGRAM N/A 06/19/2014   Procedure: ABDOMINAL Maxcine Ham;  Surgeon: Elam Dutch, MD;  Location: [facility] CATH LAB;  Service: Cardiovascular;  Laterality: N/A;  . Aortogram w/ PTA  05/14/08,  11-04-10   Bilateral aortogram w/ bilateral  SFA PTA  stenting   . BREAST SURGERY Right    boil removal  . BUNIONECTOMY     L foot in the 1980s  . COLONOSCOPY     2014  . DILATION AND CURETTAGE OF UTERUS     X4  . Epidural shots in neck     . EYE SURGERY Bilateral 2016   cataract removal  . FEMORAL-POPLITEAL BYPASS GRAFT Left 10/21/2013   Procedure: LEFT FEMORAL-POPLITEAL ARTERY BYPASS WITH SAPHENOUS VEIN  GRAFT , POPLITEAL ENDARTERECTOMY ,INTRAOPERATIVE ARTERIOGRAM, vein patch angioplasty to popliteal artery;  Surgeon: Elam Dutch, MD;  Location: [facility] OR;  Service: Vascular;  Laterality: Left;  . FEMORAL-POPLITEAL BYPASS GRAFT Right 02/08/2016   Procedure: Right FEMORAL- to Above Knee POPLITEAL ARTERY Bypass Graft with reversed saphenous vein and Common Femoral Endarterectomy  with profundoplasty;  Surgeon: Elam Dutch, MD;  Location: [place];  Service: Vascular;  Laterality: Right;  . GROIN DEBRIDEMENT Left 10/28/2013   Procedure: left inner thigh DEBRIDEMENT;  Surgeon: Elam Dutch, MD;  Location: [place];  Service: Vascular;  Laterality: Left;  . HAND SURGERY Left 2010  . LOWER EXTREMITY ANGIOGRAM Bilateral 12/24/2015   Procedure: Lower Extremity Angiogram;  Surgeon: Elam Dutch, MD;  Location: [place] CV LAB;  Service: Cardiovascular;  Laterality: Bilateral;  . PERIPHERAL VASCULAR CATHETERIZATION N/A 12/24/2015   Procedure: Abdominal Aortogram;  Surgeon: Elam Dutch, MD;  Location: [place] CV LAB;  Service: Cardiovascular;  Laterality: N/A;  . PERIPHERAL VASCULAR CATHETERIZATION Left 12/24/2015   Procedure: Peripheral Vascular Balloon Angioplasty;  Surgeon: Elam Dutch, MD;  Location: [place] CV LAB;  Service: Cardiovascular;  Laterality: Left;  drug coated balloon  . TONSILLECTOMY      Family History  Problem Relation Age of Onset  . Heart disease Father   . Heart attack Father  MI at age 30  . Hypertension Mother   . Alzheimer's disease Mother   . Hypertension Sister   . Diabetes Brother   . Hyperlipidemia Brother   . Hypertension Brother   . Heart disease Brother   . Heart attack Brother     SOCIAL HISTORY: Social History   Social History  . Marital status: Single    Spouse name: N/A  . Number of children: 1  . Years of education: N/A   Occupational History  .  [employer]   Social History Main Topics  . Smoking status: Former Smoker     Packs/day: 0.10    Types: Cigarettes    Quit date: 10/2014  . Smokeless tobacco: Never Used     Comment: pt states that she is using the E cig only   . Alcohol use 0.0 - 0.6 oz/week     Comment: Occasional  . Drug use: No  . Sexual activity: Not on file   Other Topics Concern  . Not on file   Social History Narrative  . No narrative on file    Allergies  Allergen Reactions  . Latex Rash  . Adhesive [Tape] Other (See Comments)    Pt states that tape and electrodes leave red "scars" on her skin and her skin is very sensitive.    Current Outpatient Prescriptions  Medication Sig Dispense Refill  . amLODipine-olmesartan (AZOR) 5-40 MG per tablet Take 1 tablet by mouth daily.    Marland Kitchen aspirin EC 81 MG tablet Take 81 mg by mouth daily.    Marland Kitchen atenolol (TENORMIN) 50 MG tablet Take 50 mg by mouth daily.    Marland Kitchen bismuth subsalicylate (PEPTO BISMOL) 262 MG/15ML suspension Take 30 mLs by mouth every 6 (six) hours as needed for indigestion or diarrhea or loose stools. Reported on 11/04/2015    . CARAFATE 1 GM/10ML suspension Take 10 mLs by mouth 4 (four) times daily as needed. (ACID REFLUX)  3  . gabapentin (NEURONTIN) 300 MG capsule Take 1 capsule (300 mg total) by mouth 3 (three) times daily. 90 capsule 6  . lovastatin (MEVACOR) 20 MG tablet Take 20 mg by mouth every morning.     . meloxicam (MOBIC) 15 MG tablet Take 15 mg by mouth daily.    . metFORMIN (GLUCOPHAGE) 1000 MG tablet Take 1,000 mg by mouth 2 (two) times daily with a meal.    . naproxen sodium (ALEVE) 220 MG tablet Take 220 mg by mouth 2 (two) times daily as needed (for pain).    . NON FORMULARY Take 2 tablets by mouth daily. Hair Infinity    . pantoprazole (PROTONIX) 40 MG tablet Take 40 mg by mouth daily.    . pregabalin (LYRICA) 50 MG capsule Take 1 capsule (50 mg total) by mouth daily. 30 capsule 3  . PROAIR HFA 108 (90 BASE) MCG/ACT inhaler Inhale 2 puffs into the lungs every 4 (four) hours as needed for wheezing or shortness of  breath. Reported on 11/04/2015    . temazepam (RESTORIL) 30 MG capsule Take 30 mg by mouth at bedtime.    Marland Kitchen oxyCODONE-acetaminophen (PERCOCET/ROXICET) 5-325 MG tablet Take 1 tablet by mouth every 6 (six) hours as needed for severe pain. (Patient not taking: Reported on 06/29/2016) 20 tablet 0  . traMADol (ULTRAM) 50 MG tablet Take 1 tablet (50 mg total) by mouth every 6 (six) hours as needed. (Patient not taking: Reported on 06/29/2016) 20 tablet 0  . TRAVATAN Z 0.004 % SOLN ophthalmic solution  Place 1 drop into both eyes at bedtime.      No current facility-administered medications for this visit.     ROS:   General:  No weight loss, Fever, chills  HEENT: No recent headaches, no nasal bleeding, no visual changes, no sore throat  Neurologic: No dizziness, blackouts, seizures. No recent symptoms of stroke or mini- stroke. No recent episodes of slurred speech, or temporary blindness.  Cardiac: No recent episodes of chest pain/pressure, no shortness of breath at rest.  No shortness of breath with exertion.  Denies history of atrial fibrillation or irregular heartbeat  Vascular: No history of rest pain in feet.  No history of claudication.  No history of non-healing ulcer, No history of DVT   Pulmonary: No home oxygen, no productive cough, no hemoptysis,  No asthma or wheezing  Musculoskeletal:  [ ]  Arthritis, [ ]  Low back pain,  [ ]  Joint pain  Hematologic:No history of hypercoagulable state.  No history of easy bleeding.  No history of anemia  Gastrointestinal: No hematochezia or melena,  No gastroesophageal reflux, no trouble swallowing  Urinary: [ ]  chronic Kidney disease, [ ]  on HD - [ ]  MWF or [ ]  TTHS, [ ]  Burning with urination, [ ]  Frequent urination, [ ]  Difficulty urinating;   Skin: No rashes  Psychological: No history of anxiety,  No history of depression   Physical Examination  Vitals:   06/29/16 1520  BP: 120/80  Pulse: (!) 53  Resp: 16  Temp: 97.9 F (36.6 C)    TempSrc: Oral  SpO2: 100%  Weight: 141 lb (64 kg)  Height: 5\' 3"  (1.6 m)    Body mass index is 24.98 kg/m.  General:  Alert and oriented, no acute distress HEENT: Normal Neck: No bruit or JVD Pulmonary: Clear to auscultation bilaterally Cardiac: Regular Rate and Rhythm without murmur Abdomen: Soft, non-tender, non-distended, no mass Skin: No rash Extremity Pulses:  2+ radial, brachial, femoral,2+ left absent right dorsalis pedis, absent posterior tibial pulses bilaterally Musculoskeletal: No deformity trace right ankle edema  Neurologic: Upper and lower extremity motor 5/5 and symmetric  DATA:  Bilateral ABIs performed yesterday were reviewed. ABI on the right 0.9 to left 1.03 duplex ultrasound showed increased velocities of the inflow artery above the right side bypass graft greater than 300 cm/s.  ASSESSMENT:  Possible inflow narrowing above the right femoral popliteal bypass graft.   PLAN: Aortogram with lower extremity runoff possible intervention tomorrow to evaluate possible narrowing of her right external iliac or common femoral artery. Risks benefits possible complications procedure details were discussed with the patient today including not limited to bleeding infection contrast reaction vessel injury. She understands and agrees to proceed.   Ruta Hinds, MD [facility] [place] [place] Office: [phone] Pager: [phone]

## 2016-06-30 ENCOUNTER — Other Ambulatory Visit: Payer: Self-pay | Admitting: *Deleted

## 2016-06-30 ENCOUNTER — Ambulatory Visit (HOSPITAL_COMMUNITY)
Admission: RE | Admit: 2016-06-30 | Discharge: 2016-06-30 | Disposition: A | Discharge status: Home / Self Care | Payer: Medicare Other | Source: Ambulatory Visit | Attending: Vascular Surgery | Admitting: Vascular Surgery

## 2016-06-30 ENCOUNTER — Encounter (HOSPITAL_COMMUNITY)
Admission: RE | Disposition: A | Discharge status: Home / Self Care | Payer: Self-pay | Source: Ambulatory Visit | Attending: Vascular Surgery

## 2016-06-30 DIAGNOSIS — I10 Essential (primary) hypertension: Secondary | ICD-10-CM | POA: Insufficient documentation

## 2016-06-30 DIAGNOSIS — Z87891 Personal history of nicotine dependence: Secondary | ICD-10-CM | POA: Insufficient documentation

## 2016-06-30 DIAGNOSIS — N3281 Overactive bladder: Secondary | ICD-10-CM | POA: Diagnosis not present

## 2016-06-30 DIAGNOSIS — Y832 Surgical operation with anastomosis, bypass or graft as the cause of abnormal reaction of the patient, or of later complication, without mention of misadventure at the time of the procedure: Secondary | ICD-10-CM | POA: Insufficient documentation

## 2016-06-30 DIAGNOSIS — Z833 Family history of diabetes mellitus: Secondary | ICD-10-CM | POA: Diagnosis not present

## 2016-06-30 DIAGNOSIS — Z8249 Family history of ischemic heart disease and other diseases of the circulatory system: Secondary | ICD-10-CM | POA: Insufficient documentation

## 2016-06-30 DIAGNOSIS — K219 Gastro-esophageal reflux disease without esophagitis: Secondary | ICD-10-CM | POA: Insufficient documentation

## 2016-06-30 DIAGNOSIS — M797 Fibromyalgia: Secondary | ICD-10-CM | POA: Insufficient documentation

## 2016-06-30 DIAGNOSIS — E1151 Type 2 diabetes mellitus with diabetic peripheral angiopathy without gangrene: Secondary | ICD-10-CM | POA: Diagnosis not present

## 2016-06-30 DIAGNOSIS — T82858A Stenosis of vascular prosthetic devices, implants and grafts, initial encounter: Secondary | ICD-10-CM | POA: Diagnosis not present

## 2016-06-30 DIAGNOSIS — H409 Unspecified glaucoma: Secondary | ICD-10-CM | POA: Diagnosis not present

## 2016-06-30 DIAGNOSIS — E785 Hyperlipidemia, unspecified: Secondary | ICD-10-CM | POA: Diagnosis not present

## 2016-06-30 DIAGNOSIS — I739 Peripheral vascular disease, unspecified: Secondary | ICD-10-CM

## 2016-06-30 DIAGNOSIS — Z7982 Long term (current) use of aspirin: Secondary | ICD-10-CM | POA: Insufficient documentation

## 2016-06-30 DIAGNOSIS — Z91040 Latex allergy status: Secondary | ICD-10-CM | POA: Insufficient documentation

## 2016-06-30 DIAGNOSIS — G4730 Sleep apnea, unspecified: Secondary | ICD-10-CM | POA: Insufficient documentation

## 2016-06-30 DIAGNOSIS — F418 Other specified anxiety disorders: Secondary | ICD-10-CM | POA: Insufficient documentation

## 2016-06-30 DIAGNOSIS — Z9862 Peripheral vascular angioplasty status: Secondary | ICD-10-CM

## 2016-06-30 DIAGNOSIS — Z7984 Long term (current) use of oral hypoglycemic drugs: Secondary | ICD-10-CM | POA: Diagnosis not present

## 2016-06-30 HISTORY — PX: PERIPHERAL VASCULAR CATHETERIZATION: SHX172C

## 2016-06-30 LAB — POCT I-STAT, CHEM 8
BUN: 15 mg/dL (ref 6–20)
Calcium, Ion: 1.22 mmol/L (ref 1.12–1.23)
Chloride: 106 mmol/L (ref 101–111)
Creatinine, Ser: 0.7 mg/dL (ref 0.44–1.00)
Glucose, Bld: 70 mg/dL (ref 65–99)
HCT: 38 % (ref 36.0–46.0)
Hemoglobin: 12.9 g/dL (ref 12.0–15.0)
Potassium: 3.7 mmol/L (ref 3.5–5.1)
Sodium: 142 mmol/L (ref 135–145)
TCO2: 25 mmol/L (ref 0–100)

## 2016-06-30 LAB — GLUCOSE, CAPILLARY
Glucose-Capillary: 67 mg/dL (ref 65–99)
Glucose-Capillary: 67 mg/dL (ref 65–99)
Glucose-Capillary: 92 mg/dL (ref 65–99)

## 2016-06-30 SURGERY — ABDOMINAL AORTOGRAM

## 2016-06-30 MED ORDER — ACETAMINOPHEN 325 MG PO TABS: 325.0000 mg | ORAL_TABLET | ORAL | Status: DC | PRN

## 2016-06-30 MED ORDER — SODIUM CHLORIDE 0.45 % IV SOLN
INTRAVENOUS | Status: AC
  Administered 2016-06-30: 14:00:00 via INTRAVENOUS

## 2016-06-30 MED ORDER — LIDOCAINE HCL (PF) 1 % IJ SOLN
INTRAMUSCULAR | Status: DC | PRN
  Administered 2016-06-30: 20 mL

## 2016-06-30 MED ORDER — FENTANYL CITRATE (PF) 100 MCG/2ML IJ SOLN
INTRAMUSCULAR | Status: AC
  Filled 2016-06-30: qty 2

## 2016-06-30 MED ORDER — IODIXANOL 320 MG/ML IV SOLN
INTRAVENOUS | Status: DC | PRN
  Administered 2016-06-30: 150 mL via INTRAVENOUS

## 2016-06-30 MED ORDER — LIDOCAINE HCL (PF) 1 % IJ SOLN
INTRAMUSCULAR | Status: AC
  Filled 2016-06-30: qty 30

## 2016-06-30 MED ORDER — HEPARIN (PORCINE) IN NACL 2-0.9 UNIT/ML-% IJ SOLN
INTRAMUSCULAR | Status: DC | PRN
  Administered 2016-06-30: 1000 mL

## 2016-06-30 MED ORDER — HEPARIN (PORCINE) IN NACL 2-0.9 UNIT/ML-% IJ SOLN
INTRAMUSCULAR | Status: AC
  Filled 2016-06-30: qty 1000

## 2016-06-30 MED ORDER — METOPROLOL TARTRATE 5 MG/5ML IV SOLN: 2.0000 mg | INTRAVENOUS | Status: DC | PRN

## 2016-06-30 MED ORDER — MORPHINE SULFATE (PF) 10 MG/ML IV SOLN: 2.0000 mg | INTRAVENOUS | Status: DC | PRN

## 2016-06-30 MED ORDER — GUAIFENESIN-DM 100-10 MG/5ML PO SYRP: 15.0000 mL | ORAL_SOLUTION | ORAL | Status: DC | PRN

## 2016-06-30 MED ORDER — LABETALOL HCL 5 MG/ML IV SOLN: 10.0000 mg | INTRAVENOUS | Status: DC | PRN

## 2016-06-30 MED ORDER — HYDRALAZINE HCL 20 MG/ML IJ SOLN: 5.0000 mg | INTRAMUSCULAR | Status: DC | PRN

## 2016-06-30 MED ORDER — ONDANSETRON HCL 4 MG/2ML IJ SOLN: 4.0000 mg | Freq: Four times a day (QID) | INTRAMUSCULAR | Status: DC | PRN

## 2016-06-30 MED ORDER — ALUM & MAG HYDROXIDE-SIMETH 200-200-20 MG/5ML PO SUSP: 15.0000 mL | ORAL | Status: DC | PRN

## 2016-06-30 MED ORDER — FENTANYL CITRATE (PF) 100 MCG/2ML IJ SOLN
INTRAMUSCULAR | Status: DC | PRN
  Administered 2016-06-30: 25 ug via INTRAVENOUS

## 2016-06-30 MED ORDER — ACETAMINOPHEN 325 MG RE SUPP: 325.0000 mg | RECTAL | Status: DC | PRN

## 2016-06-30 MED ORDER — SODIUM CHLORIDE 0.9 % IV SOLN: INTRAVENOUS | Status: DC

## 2016-06-30 SURGICAL SUPPLY — 9 items
CATH OMNI FLUSH 5F 65CM (CATHETERS) ×1 IMPLANT
COVER PRB 48X5XTLSCP FOLD TPE (BAG) IMPLANT
COVER PROBE 5X48 (BAG) ×6
KIT PV (KITS) ×3 IMPLANT
SHEATH PINNACLE 5F 10CM (SHEATH) ×2 IMPLANT
SYR MEDRAD MARK V 150ML (SYRINGE) ×3 IMPLANT
TRANSDUCER W/STOPCOCK (MISCELLANEOUS) ×3 IMPLANT
TRAY PV CATH (CUSTOM PROCEDURE TRAY) ×3 IMPLANT
WIRE HITORQ VERSACORE ST 145CM (WIRE) ×1 IMPLANT

## 2016-06-30 NOTE — Interval H&P Note (Signed)
History and Physical Interval Note:  06/30/2016 11:22 AM  Jill Higgins  has presented today for surgery, with the diagnosis of numbness, tingling left knee  The various methods of treatment have been discussed with the patient and family. After consideration of risks, benefits and other options for treatment, the patient has consented to  Procedure(s): Abdominal Aortogram w/Lower Extremity (N/A) as a surgical intervention .  The patient's history has been reviewed, patient examined, no change in status, stable for surgery.  I have reviewed the patient's chart and labs.  Questions were answered to the patient's satisfaction.     Ruta Hinds

## 2016-06-30 NOTE — Progress Notes (Signed)
Site area: left groin sheath pulled by Harrison Mons Site Prior to Removal:  Level 0 Pressure Applied For  20 minutes: Manual:   yes Patient Status During Pull:  stable Post Pull Site:  Level  0 Post Pull Instructions Given:  yes Post Pull Pulses Present: yes 2+ DP bilaterally Dressing Applied:  tegaderm Bedrest begins @ 1350 Comments:

## 2016-06-30 NOTE — Discharge Instructions (Signed)
Angiogram, Care After °Refer to this sheet in the next few weeks. These instructions provide you with information about caring for yourself after your procedure. Your health care provider may also give you more specific instructions. Your treatment has been planned according to current medical practices, but problems sometimes occur. Call your health care provider if you have any problems or questions after your procedure. °WHAT TO EXPECT AFTER THE PROCEDURE °After your procedure, it is typical to have the following: °· Bruising at the catheter insertion site that usually fades within 1-2 weeks. °· Blood collecting in the tissue (hematoma) that may be painful to the touch. It should usually decrease in size and tenderness within 1-2 weeks. °HOME CARE INSTRUCTIONS °· Take medicines only as directed by your health care provider. °· You may shower 24-48 hours after the procedure or as directed by your health care provider. Remove the bandage (dressing) and gently wash the site with plain soap and water. Pat the area dry with a clean towel. Do not rub the site, because this may cause bleeding. °· Do not take baths, swim, or use a hot tub until your health care provider approves. °· Check your insertion site every day for redness, swelling, or drainage. °· Do not apply powder or lotion to the site. °· Do not lift over 10 lb (4.5 kg) for 5 days after your procedure or as directed by your health care provider. °· Ask your health care provider when it is okay to: °¨ Return to work or school. °¨ Resume usual physical activities or sports. °¨ Resume sexual activity. °· Do not drive home if you are discharged the same day as the procedure. Have someone else drive you. °· You may drive 24 hours after the procedure unless otherwise instructed by your health care provider. °· Do not operate machinery or power tools for 24 hours after the procedure or as directed by your health care provider. °· If your procedure was done as an  outpatient procedure, which means that you went home the same day as your procedure, a responsible adult should be with you for the first 24 hours after you arrive home. °· Keep all follow-up visits as directed by your health care provider. This is important. °SEEK MEDICAL CARE IF: °· You have a fever. °· You have chills. °· You have increased bleeding from the catheter insertion site. Hold pressure on the site.  CALL 911 °SEEK IMMEDIATE MEDICAL CARE IF: °· You have unusual pain at the catheter insertion site. °· You have redness, warmth, or swelling at the catheter insertion site. °· You have drainage (other than a small amount of blood on the dressing) from the catheter insertion site. °· The catheter insertion site is bleeding, and the bleeding does not stop after 30 minutes of holding steady pressure on the site. °· The area near or just beyond the catheter insertion site becomes pale, cool, tingly, or numb. °  °This information is not intended to replace advice given to you by your health care provider. Make sure you discuss any questions you have with your health care provider. °  °Document Released: 05/25/2005 Document Revised: 11/27/2014 Document Reviewed: 04/09/2013 °Elsevier Interactive Patient Education ©2016 [employer] Inc[employer] ° °

## 2016-06-30 NOTE — H&P (View-Only) (Signed)
Referring Physician: Carol Ada, MD  Patient name: Jill Higgins MRN: [id number] DOB: [birth year] Sex: female  HPI: Jill Higgins is a 70 y.o. female,  who is status post bilateral femoral-popliteal bypass grafts. She returns today for follow-up. She denies any symptoms of lower extremity claudication. She does still complain of numbness burning and tingling around her knee And calf area. She has previously taken Neurontin for this. She has not really noticed any difference. She denies rest pain. She has no nonhealing wounds. Other medical problems include diabetes, hyperlipidemia, hypertension all of which are currently stable.  Past Medical History:  Diagnosis Date  . Bronchitis   . Bulging disc    in neck  . Depression with anxiety   . Diabetes mellitus    Type II  . Fibromyalgia    pt. denies  . GERD (gastroesophageal reflux disease)   . Glaucoma   . History of bronchitis   . Hyperlipidemia   . Hypertension   . Overactive bladder   . Peripheral vascular disease ([place])   . Pneumonia   . Sleep apnea   . Tobacco abuse   . Urinary frequency    Past Surgical History:  Procedure Laterality Date  . ABDOMINAL AORTAGRAM N/A 08/15/2013   Procedure: ABDOMINAL Maxcine Ham;  Surgeon: Elam Dutch, MD;  Location: [facility] CATH LAB;  Service: Cardiovascular;  Laterality: N/A;  . ABDOMINAL AORTAGRAM N/A 06/19/2014   Procedure: ABDOMINAL Maxcine Ham;  Surgeon: Elam Dutch, MD;  Location: [facility] CATH LAB;  Service: Cardiovascular;  Laterality: N/A;  . Aortogram w/ PTA  05/14/08,  11-04-10   Bilateral aortogram w/ bilateral  SFA PTA  stenting   . BREAST SURGERY Right    boil removal  . BUNIONECTOMY     L foot in the 1980s  . COLONOSCOPY     2014  . DILATION AND CURETTAGE OF UTERUS     X4  . Epidural shots in neck     . EYE SURGERY Bilateral 2016   cataract removal  . FEMORAL-POPLITEAL BYPASS GRAFT Left 10/21/2013   Procedure: LEFT FEMORAL-POPLITEAL ARTERY BYPASS WITH SAPHENOUS VEIN  GRAFT , POPLITEAL ENDARTERECTOMY ,INTRAOPERATIVE ARTERIOGRAM, vein patch angioplasty to popliteal artery;  Surgeon: Elam Dutch, MD;  Location: [facility] OR;  Service: Vascular;  Laterality: Left;  . FEMORAL-POPLITEAL BYPASS GRAFT Right 02/08/2016   Procedure: Right FEMORAL- to Above Knee POPLITEAL ARTERY Bypass Graft with reversed saphenous vein and Common Femoral Endarterectomy  with profundoplasty;  Surgeon: Elam Dutch, MD;  Location: [place];  Service: Vascular;  Laterality: Right;  . GROIN DEBRIDEMENT Left 10/28/2013   Procedure: left inner thigh DEBRIDEMENT;  Surgeon: Elam Dutch, MD;  Location: [place];  Service: Vascular;  Laterality: Left;  . HAND SURGERY Left 2010  . LOWER EXTREMITY ANGIOGRAM Bilateral 12/24/2015   Procedure: Lower Extremity Angiogram;  Surgeon: Elam Dutch, MD;  Location: [place] CV LAB;  Service: Cardiovascular;  Laterality: Bilateral;  . PERIPHERAL VASCULAR CATHETERIZATION N/A 12/24/2015   Procedure: Abdominal Aortogram;  Surgeon: Elam Dutch, MD;  Location: [place] CV LAB;  Service: Cardiovascular;  Laterality: N/A;  . PERIPHERAL VASCULAR CATHETERIZATION Left 12/24/2015   Procedure: Peripheral Vascular Balloon Angioplasty;  Surgeon: Elam Dutch, MD;  Location: [place] CV LAB;  Service: Cardiovascular;  Laterality: Left;  drug coated balloon  . TONSILLECTOMY      Family History  Problem Relation Age of Onset  . Heart disease Father   . Heart attack Father  MI at age 30  . Hypertension Mother   . Alzheimer's disease Mother   . Hypertension Sister   . Diabetes Brother   . Hyperlipidemia Brother   . Hypertension Brother   . Heart disease Brother   . Heart attack Brother     SOCIAL HISTORY: Social History   Social History  . Marital status: Single    Spouse name: N/A  . Number of children: 1  . Years of education: N/A   Occupational History  .  [employer]   Social History Main Topics  . Smoking status: Former Smoker     Packs/day: 0.10    Types: Cigarettes    Quit date: 10/2014  . Smokeless tobacco: Never Used     Comment: pt states that she is using the E cig only   . Alcohol use 0.0 - 0.6 oz/week     Comment: Occasional  . Drug use: No  . Sexual activity: Not on file   Other Topics Concern  . Not on file   Social History Narrative  . No narrative on file    Allergies  Allergen Reactions  . Latex Rash  . Adhesive [Tape] Other (See Comments)    Pt states that tape and electrodes leave red "scars" on her skin and her skin is very sensitive.    Current Outpatient Prescriptions  Medication Sig Dispense Refill  . amLODipine-olmesartan (AZOR) 5-40 MG per tablet Take 1 tablet by mouth daily.    Marland Kitchen aspirin EC 81 MG tablet Take 81 mg by mouth daily.    Marland Kitchen atenolol (TENORMIN) 50 MG tablet Take 50 mg by mouth daily.    Marland Kitchen bismuth subsalicylate (PEPTO BISMOL) 262 MG/15ML suspension Take 30 mLs by mouth every 6 (six) hours as needed for indigestion or diarrhea or loose stools. Reported on 11/04/2015    . CARAFATE 1 GM/10ML suspension Take 10 mLs by mouth 4 (four) times daily as needed. (ACID REFLUX)  3  . gabapentin (NEURONTIN) 300 MG capsule Take 1 capsule (300 mg total) by mouth 3 (three) times daily. 90 capsule 6  . lovastatin (MEVACOR) 20 MG tablet Take 20 mg by mouth every morning.     . meloxicam (MOBIC) 15 MG tablet Take 15 mg by mouth daily.    . metFORMIN (GLUCOPHAGE) 1000 MG tablet Take 1,000 mg by mouth 2 (two) times daily with a meal.    . naproxen sodium (ALEVE) 220 MG tablet Take 220 mg by mouth 2 (two) times daily as needed (for pain).    . NON FORMULARY Take 2 tablets by mouth daily. Hair Infinity    . pantoprazole (PROTONIX) 40 MG tablet Take 40 mg by mouth daily.    . pregabalin (LYRICA) 50 MG capsule Take 1 capsule (50 mg total) by mouth daily. 30 capsule 3  . PROAIR HFA 108 (90 BASE) MCG/ACT inhaler Inhale 2 puffs into the lungs every 4 (four) hours as needed for wheezing or shortness of  breath. Reported on 11/04/2015    . temazepam (RESTORIL) 30 MG capsule Take 30 mg by mouth at bedtime.    Marland Kitchen oxyCODONE-acetaminophen (PERCOCET/ROXICET) 5-325 MG tablet Take 1 tablet by mouth every 6 (six) hours as needed for severe pain. (Patient not taking: Reported on 06/29/2016) 20 tablet 0  . traMADol (ULTRAM) 50 MG tablet Take 1 tablet (50 mg total) by mouth every 6 (six) hours as needed. (Patient not taking: Reported on 06/29/2016) 20 tablet 0  . TRAVATAN Z 0.004 % SOLN ophthalmic solution  Place 1 drop into both eyes at bedtime.      No current facility-administered medications for this visit.     ROS:   General:  No weight loss, Fever, chills  HEENT: No recent headaches, no nasal bleeding, no visual changes, no sore throat  Neurologic: No dizziness, blackouts, seizures. No recent symptoms of stroke or mini- stroke. No recent episodes of slurred speech, or temporary blindness.  Cardiac: No recent episodes of chest pain/pressure, no shortness of breath at rest.  No shortness of breath with exertion.  Denies history of atrial fibrillation or irregular heartbeat  Vascular: No history of rest pain in feet.  No history of claudication.  No history of non-healing ulcer, No history of DVT   Pulmonary: No home oxygen, no productive cough, no hemoptysis,  No asthma or wheezing  Musculoskeletal:  [ ]  Arthritis, [ ]  Low back pain,  [ ]  Joint pain  Hematologic:No history of hypercoagulable state.  No history of easy bleeding.  No history of anemia  Gastrointestinal: No hematochezia or melena,  No gastroesophageal reflux, no trouble swallowing  Urinary: [ ]  chronic Kidney disease, [ ]  on HD - [ ]  MWF or [ ]  TTHS, [ ]  Burning with urination, [ ]  Frequent urination, [ ]  Difficulty urinating;   Skin: No rashes  Psychological: No history of anxiety,  No history of depression   Physical Examination  Vitals:   06/29/16 1520  BP: 120/80  Pulse: (!) 53  Resp: 16  Temp: 97.9 F (36.6 C)    TempSrc: Oral  SpO2: 100%  Weight: 141 lb (64 kg)  Height: 5\' 3"  (1.6 m)    Body mass index is 24.98 kg/m.  General:  Alert and oriented, no acute distress HEENT: Normal Neck: No bruit or JVD Pulmonary: Clear to auscultation bilaterally Cardiac: Regular Rate and Rhythm without murmur Abdomen: Soft, non-tender, non-distended, no mass Skin: No rash Extremity Pulses:  2+ radial, brachial, femoral,2+ left absent right dorsalis pedis, absent posterior tibial pulses bilaterally Musculoskeletal: No deformity trace right ankle edema  Neurologic: Upper and lower extremity motor 5/5 and symmetric  DATA:  Bilateral ABIs performed yesterday were reviewed. ABI on the right 0.9 to left 1.03 duplex ultrasound showed increased velocities of the inflow artery above the right side bypass graft greater than 300 cm/s.  ASSESSMENT:  Possible inflow narrowing above the right femoral popliteal bypass graft.   PLAN: Aortogram with lower extremity runoff possible intervention tomorrow to evaluate possible narrowing of her right external iliac or common femoral artery. Risks benefits possible complications procedure details were discussed with the patient today including not limited to bleeding infection contrast reaction vessel injury. She understands and agrees to proceed.   Ruta Hinds, MD [facility] [place] Office: [phone] Pager: [phone]

## 2016-06-30 NOTE — Op Note (Signed)
Procedure: Abdominal aortogram with bilateral lower extremity runoff  Preoperative diagnosis: Vein graft stenosis  Postoperative diagnosis: Same  Anesthesia: Local  Operative findings: #1 50-70% stenosis right distal external iliac artery                                                                  #2 patent bilateral femoral popliteal bypass grafts with primary runoff via the anterior tibial to dorsalis pedis artery but proximally patent all 3 vessels  Operative details: After obtaining informed consent, the patient was taken the PV lab. The patient was placed supine position the Angio table. Both groins were prepped and draped in the usual sterile fashion. Local anesthesia was infiltrated over the right common femoral artery. Ultrasound was used to identify the right common femoral artery and femoral bifurcation and the left femoropopliteal bypass. An introducer needle was used to cannulate above the femoropopliteal bypass under ultrasound guidance. The guidewire did advance initially. I tried to advance a 5 [place] sheath over this. I could not aspirate blood back. Contrast was injected and we were not intraluminal. A 5 French sheath was removed and hemostasis obtained with direct pressure. After holding pressure for several minutes, again ultrasound was used to identify the similar area. An introducer needle was then used to cannulate the right common femoral artery without difficulty. This was done under ultrasound guidance. An 035 versacore wire was inserted up the abdominal aorta under fluoroscopic guidance. Next a 5 French sheath was placed over the guidewire and right common femoral artery. This was thoroughly flushed with heparin saline. A 5 French omniflush catheter was then advanced over the guidewire up into the abdominal aorta and abdominal aortogram obtained in AP projection.  The left and right renal arteries are patent. The infrarenal abdominal aorta is patent. Left and right common  and internal iliac arteries are patent. The right external iliac artery has a diffuse narrowing from 50 to about 70% above the origin of the right femoropopliteal bypass graft. The left external iliac artery is widely patent.  At this point the Omni Flush catheter was pulled down above the aortic bifurcation. Bilateral oblique views of the pelvis were obtained which confirmed the above findings. I then used the Omni Flush catheter to do bilateral lower extremity runoff views.  In the left lower extremity, the left common femoral artery is patent. The left superficial femoral artery native is occluded. There is a femoropopliteal bypass originating from the left common femoral and terminating in the above-knee popliteal artery. This is widely patent. There is three-vessel runoff to the left foot.  In the right lower extremity, there were similar findings as the left. The femoropopliteal bypass also is widely patent it originates in the common femoral terminating the above-knee popliteal with three-vessel runoff.  At this point I decided not to intervene on the lesion in the right external iliac artery. Again this her presents about 50-70% stenosis. 2 angioplasty or stent this would be at the borderline common femoral artery and not much room for a stent if this was required. We will continue to observe this with serial ultrasound for velocities to and continue to increase or her ABIs decreased to consider an intervention for this. At this point the Omni Flush catheter was removed over  a guidewire. The 5 French sheath was left in place to be pulled in the holding area. The patient tolerated the procedure well and there were no complications.  Operative management: 50-70% stenosis distal right external iliac proximal common femoral artery. The patient will have a repeat duplex exam in 3 months time. She will also have bilateral ABIs. If she develops symptoms has significant decrease in her ABIs or significant  increase in velocities in her external iliac artery we will consider an intervention.  Jill Hinds, MD [facility] Office: [phone] Pager: [phone]

## 2016-07-03 ENCOUNTER — Encounter (HOSPITAL_COMMUNITY): Payer: Self-pay | Admitting: Vascular Surgery

## 2016-07-03 DIAGNOSIS — B079 Viral wart, unspecified: Secondary | ICD-10-CM | POA: Diagnosis not present

## 2016-07-03 DIAGNOSIS — M79671 Pain in right foot: Secondary | ICD-10-CM | POA: Diagnosis not present

## 2016-07-31 DIAGNOSIS — H3589 Other specified retinal disorders: Secondary | ICD-10-CM | POA: Diagnosis not present

## 2016-07-31 DIAGNOSIS — H53433 Sector or arcuate defects, bilateral: Secondary | ICD-10-CM | POA: Diagnosis not present

## 2016-07-31 DIAGNOSIS — J209 Acute bronchitis, unspecified: Secondary | ICD-10-CM | POA: Diagnosis not present

## 2016-07-31 DIAGNOSIS — H401133 Primary open-angle glaucoma, bilateral, severe stage: Secondary | ICD-10-CM | POA: Diagnosis not present

## 2016-07-31 DIAGNOSIS — H2513 Age-related nuclear cataract, bilateral: Secondary | ICD-10-CM | POA: Diagnosis not present

## 2016-08-03 DIAGNOSIS — M7751 Other enthesopathy of right foot: Secondary | ICD-10-CM | POA: Diagnosis not present

## 2016-08-03 DIAGNOSIS — J209 Acute bronchitis, unspecified: Secondary | ICD-10-CM | POA: Diagnosis not present

## 2016-08-03 DIAGNOSIS — B079 Viral wart, unspecified: Secondary | ICD-10-CM | POA: Diagnosis not present

## 2016-08-03 DIAGNOSIS — M79672 Pain in left foot: Secondary | ICD-10-CM | POA: Diagnosis not present

## 2016-08-03 DIAGNOSIS — M216X1 Other acquired deformities of right foot: Secondary | ICD-10-CM | POA: Diagnosis not present

## 2016-08-03 DIAGNOSIS — M216X2 Other acquired deformities of left foot: Secondary | ICD-10-CM | POA: Diagnosis not present

## 2016-08-03 DIAGNOSIS — M25571 Pain in right ankle and joints of right foot: Secondary | ICD-10-CM | POA: Diagnosis not present

## 2016-08-18 ENCOUNTER — Telehealth: Payer: Self-pay | Admitting: Vascular Surgery

## 2016-08-18 NOTE — Telephone Encounter (Signed)
Spoke to pt on home # advised of appts on 11/16 for [place] x2 and f/u with Doc

## 2016-08-18 NOTE — Telephone Encounter (Signed)
-----   Message from Mena Goes, RN sent at 06/30/2016  1:01 PM EDT ----- Regarding: schedule 3 months w/ labs   ----- Message ----- From: Elam Dutch, MD Sent: 06/30/2016  12:50 PM To: [facility] Charge Pool  Aortogram with bilat runoff  [place] left groin  50-70% right external iliac stenosis  Will need repeat bilateral ABI and duplex of right leg in 3 months  Ruta Hinds

## 2016-08-21 ENCOUNTER — Institutional Professional Consult (permissible substitution): Payer: Medicare Other | Admitting: Neurology

## 2016-08-21 ENCOUNTER — Encounter: Payer: Self-pay | Admitting: Neurology

## 2016-08-21 ENCOUNTER — Ambulatory Visit (INDEPENDENT_AMBULATORY_CARE_PROVIDER_SITE_OTHER): Payer: Medicare Other | Admitting: Neurology

## 2016-08-21 VITALS — BP 138/62 | HR 68 | Resp 16 | Ht 63.0 in | Wt 142.0 lb

## 2016-08-21 DIAGNOSIS — R351 Nocturia: Secondary | ICD-10-CM

## 2016-08-21 DIAGNOSIS — G4763 Sleep related bruxism: Secondary | ICD-10-CM | POA: Diagnosis not present

## 2016-08-21 DIAGNOSIS — G4733 Obstructive sleep apnea (adult) (pediatric): Secondary | ICD-10-CM | POA: Diagnosis not present

## 2016-08-21 DIAGNOSIS — G4710 Hypersomnia, unspecified: Secondary | ICD-10-CM

## 2016-08-21 DIAGNOSIS — G4719 Other hypersomnia: Secondary | ICD-10-CM | POA: Diagnosis not present

## 2016-08-21 DIAGNOSIS — I739 Peripheral vascular disease, unspecified: Secondary | ICD-10-CM

## 2016-08-21 NOTE — Progress Notes (Signed)
Subjective:    Patient ID: Jill Higgins is a 70 y.o. female.  HPI      Star Age, MD, PhD [facility] [address], [address] [place], [place] [address]  Dear Dr. Everlene Farrier,   I saw your patient, Jill Higgins, upon your kind request in my neurologic clinic today for initial consultation of her sleep disorder, in particular, reevaluation of a prior diagnosis of OSA. The patient is unaccompanied today. As you know, Jill Higgins is a 70 year old right-handed woman with an underlying medical history of peripheral vascular disease status post femoropopliteal bypasses (L in 12/14, and R in 3/17), prior smoking, hypertension, hyperlipidemia, reflux disease, depression, anxiety, who was previously diagnosed with obstructive sleep apnea over 10 years ago and placed on CPAP therapy. Sleep study was done in [place]. Prior test results are not available for my review. She reports not using her CPAP regularly and while she brought the machine, a download was not possible from her machine.   She has an old CPAP machine, Estimates that her machine is about 71 years old, REMstar Plus. She feels uncomfortable with it.  She quit smoking since late 2015, now on e-cig. She was recently treated for bronchitis, with ABx and inhalers. Still has residual Cough and congestion. She has difficulty maintaining sleep and snores loudly. She denies restless leg symptoms. She lives alone. She is divorced. She has 1 grown daughter and one grandson. She drinks alcohol very occasionally and drinks coffee 2 cups per day and has reduced her soda intake.  She retired from [employer]. She has not been keeping a very set scheduled for her sleep and wake time. She was noted to have evidence of tooth grinding and recently noticed that she had chipped her right front tooth. She is going to have it repaired.  Her Past Medical History Is Significant For: Past Medical History:  Diagnosis Date  .  Bronchitis   . Bulging disc    in neck  . Depression with anxiety   . Diabetes mellitus    Type II  . Fibromyalgia    pt. denies  . GERD (gastroesophageal reflux disease)   . Glaucoma   . History of bronchitis   . Hyperlipidemia   . Hypertension   . Overactive bladder   . Peripheral vascular disease ([place])   . Pneumonia   . Sleep apnea   . Tobacco abuse   . Urinary frequency     Her Past Surgical History Is Significant For: Past Surgical History:  Procedure Laterality Date  . ABDOMINAL AORTAGRAM N/A 08/15/2013   Procedure: ABDOMINAL Maxcine Ham;  Surgeon: Elam Dutch, MD;  Location: [facility] CATH LAB;  Service: Cardiovascular;  Laterality: N/A;  . ABDOMINAL AORTAGRAM N/A 06/19/2014   Procedure: ABDOMINAL Maxcine Ham;  Surgeon: Elam Dutch, MD;  Location: [facility] CATH LAB;  Service: Cardiovascular;  Laterality: N/A;  . Aortogram w/ PTA  05/14/08,  11-04-10   Bilateral aortogram w/ bilateral  SFA PTA  stenting   . BREAST SURGERY Right    boil removal  . BUNIONECTOMY     L foot in the 1980s  . COLONOSCOPY     2014  . DILATION AND CURETTAGE OF UTERUS     X4  . Epidural shots in neck     . EYE SURGERY Bilateral 2016   cataract removal  . FEMORAL-POPLITEAL BYPASS GRAFT Left 10/21/2013   Procedure: LEFT FEMORAL-POPLITEAL ARTERY BYPASS WITH SAPHENOUS VEIN GRAFT , POPLITEAL ENDARTERECTOMY ,INTRAOPERATIVE  ARTERIOGRAM, vein patch angioplasty to popliteal artery;  Surgeon: Elam Dutch, MD;  Location: [place];  Service: Vascular;  Laterality: Left;  . FEMORAL-POPLITEAL BYPASS GRAFT Right 02/08/2016   Procedure: Right FEMORAL- to Above Knee POPLITEAL ARTERY Bypass Graft with reversed saphenous vein and Common Femoral Endarterectomy  with profundoplasty;  Surgeon: Elam Dutch, MD;  Location: [place];  Service: Vascular;  Laterality: Right;  . GROIN DEBRIDEMENT Left 10/28/2013   Procedure: left inner thigh DEBRIDEMENT;  Surgeon: Elam Dutch, MD;  Location: [place];  Service: Vascular;   Laterality: Left;  . HAND SURGERY Left 2010  . LOWER EXTREMITY ANGIOGRAM Bilateral 12/24/2015   Procedure: Lower Extremity Angiogram;  Surgeon: Elam Dutch, MD;  Location: [place] CV LAB;  Service: Cardiovascular;  Laterality: Bilateral;  . PERIPHERAL VASCULAR CATHETERIZATION N/A 12/24/2015   Procedure: Abdominal Aortogram;  Surgeon: Elam Dutch, MD;  Location: [place] CV LAB;  Service: Cardiovascular;  Laterality: N/A;  . PERIPHERAL VASCULAR CATHETERIZATION Left 12/24/2015   Procedure: Peripheral Vascular Balloon Angioplasty;  Surgeon: Elam Dutch, MD;  Location: [place] CV LAB;  Service: Cardiovascular;  Laterality: Left;  drug coated balloon  . PERIPHERAL VASCULAR CATHETERIZATION N/A 06/30/2016   Procedure: Abdominal Aortogram;  Surgeon: Elam Dutch, MD;  Location: [place] CV LAB;  Service: Cardiovascular;  Laterality: N/A;  . PERIPHERAL VASCULAR CATHETERIZATION Bilateral 06/30/2016   Procedure: Lower Extremity Angiography;  Surgeon: Elam Dutch, MD;  Location: [place] CV [facility];  Service: Cardiovascular;  Laterality: Bilateral;  . TONSILLECTOMY      Her Family History Is Significant For: Family History  Problem Relation Age of Onset  . Heart disease Father   . Heart attack Father     MI at age 60  . Hypertension Mother   . Alzheimer's disease Mother   . Hypertension Sister   . Diabetes Brother   . Hyperlipidemia Brother   . Hypertension Brother   . Heart disease Brother   . Heart attack Brother     Her Social History Is Significant For: Social History   Social History  . Marital status: Single    Spouse name: N/A  . Number of children: 1  . Years of education: N/A   Occupational History  . Retired Sales promotion account executive   Social History Main Topics  . Smoking status: Former Smoker    Packs/day: 0.10    Types: Cigarettes    Quit date: 10/2014  . Smokeless tobacco: Never Used     Comment: pt states that she is using the E cig only   . Alcohol use  0.0 - 0.6 oz/week     Comment: Occasional  . Drug use: No  . Sexual activity: Not Asked   Other Topics Concern  . None   Social History Narrative   Drinks about 2 cups of coffee a day, and some tea     Her Allergies Are:  Allergies  Allergen Reactions  . Latex Rash  . Adhesive [Tape] Other (See Comments)    Pt states that tape and electrodes leave red "scars" on her skin and her skin is very sensitive.  :   Her Current Medications Are:  Outpatient Encounter Prescriptions as of 08/21/2016  Medication Sig  . aspirin EC 81 MG tablet Take 81 mg by mouth daily.  Marland Kitchen atenolol (TENORMIN) 50 MG tablet Take 50 mg by mouth daily.  Marland Kitchen bismuth subsalicylate (PEPTO BISMOL) 262 MG/15ML suspension Take 30 mLs by mouth every 6 (six)  hours as needed for indigestion or diarrhea or loose stools. Reported on 11/04/2015  . CARAFATE 1 GM/10ML suspension Take 10 mLs by mouth 4 (four) times daily as needed. (ACID REFLUX)  . Cyanocobalamin (VITAMIN B-12) 5000 MCG TBDP Take by mouth.  . gabapentin (NEURONTIN) 300 MG capsule Take 1 capsule (300 mg total) by mouth 3 (three) times daily.  Marland Kitchen glimepiride (AMARYL) 4 MG tablet Take 4 mg by mouth daily with breakfast.  . latanoprost (XALATAN) 0.005 % ophthalmic solution Place 1 drop into both eyes at bedtime.  . lovastatin (MEVACOR) 20 MG tablet Take 20 mg by mouth every morning.   . metFORMIN (GLUCOPHAGE) 1000 MG tablet Take 1,000 mg by mouth 2 (two) times daily with a meal.  . pantoprazole (PROTONIX) 40 MG tablet Take 40 mg by mouth daily.  Marland Kitchen PROAIR HFA 108 (90 BASE) MCG/ACT inhaler Inhale 2 puffs into the lungs every 4 (four) hours as needed for wheezing or shortness of breath. Reported on 11/04/2015  . temazepam (RESTORIL) 30 MG capsule Take 30 mg by mouth at bedtime.  . [DISCONTINUED] amLODipine-olmesartan (AZOR) 5-40 MG per tablet Take 1 tablet by mouth daily.  . [DISCONTINUED] baclofen (LIORESAL) 10 MG tablet Take 10 mg by mouth 2 (two) times daily.  .  [DISCONTINUED] meloxicam (MOBIC) 15 MG tablet Take 15 mg by mouth daily.  . [DISCONTINUED] naproxen sodium (ALEVE) 220 MG tablet Take 220 mg by mouth 2 (two) times daily as needed (for pain).  . [DISCONTINUED] pregabalin (LYRICA) 50 MG capsule Take 1 capsule (50 mg total) by mouth daily. (Patient not taking: Reported on 06/29/2016)   No facility-administered encounter medications on file as of 08/21/2016.   : Review of Systems:  Out of a complete 14 point review of systems, all are reviewed and negative with the exception of these symptoms as listed below:  Review of Systems  Constitutional: Positive for fatigue.  Neurological:       Patient had sleep study 12-13 years ago. She brought CPAP machine with her. Unable to get download. Reports that she rarely uses it.   Grinds teeth in sleep, has trouble staying asleep, wakes up ever 2-3 hours, snoring, witnessed apnea, wakes up feeling tired, daytime fatigue, takes naps.   Psychiatric/Behavioral:       Decreased energy    Epworth Sleepiness Scale 0= would never doze 1= slight chance of dozing 2= moderate chance of dozing 3= high chance of dozing  Sitting and reading:2 Watching TV:2 Sitting inactive in a public place (ex. Theater or meeting):0 As a passenger in a car for an hour without a break:2 Lying down to rest in the afternoon:3 Sitting and talking to someone:0 Sitting quietly after lunch (no alcohol):2 In a car, while stopped in traffic:0 Total:11  Objective:  Neurologic Exam  Physical Exam Physical Examination:   Vitals:   08/21/16 1516  BP: 138/62  Pulse: 68  Resp: 16   General Examination: The patient is a very pleasant 70 y.o. female in no acute distress. She appears well-developed and well-nourished and well groomed.    HEENT: Normocephalic, atraumatic, pupils are equal, round and reactive to light and accommodation. Funduscopic exam is normal with sharp disc margins noted. S/p bilateral cataract repairs.  Extraocular tracking is good without limitation to gaze excursion or nystagmus noted. Normal smooth pursuit is noted. Hearing is grossly intact. Face is symmetric with normal facial animation and normal facial sensation. Speech is clear with no dysarthria noted. There is no hypophonia. There is no  lip, neck/head, jaw or voice tremor. Neck is supple with full range of passive and active motion. There are no carotid bruits on auscultation. Oropharynx exam reveals: mild mouth dryness, adequate dental hygiene, but chipped R front tooth from tooth grinding. Moderate airway crowding, due to redundant soft palate and larger uvula. Mallampati is class II. Tongue protrudes centrally and palate elevates symmetrically. Tonsils are absent. Neck size is 14.75 inches. She has a Moderate overbite. Nasal inspection reveals no significant nasal mucosal bogginess or redness and no septal deviation.   Chest: Clear to auscultation with mild wheezing and rhonchi noted, no crackles.  Heart: S1+S2+0, regular and normal without murmurs, rubs or gallops noted.   Abdomen: Soft, non-tender and non-distended with normal bowel sounds appreciated on auscultation.  Extremities: There is no pitting edema in the distal lower extremities bilaterally. Pedal pulses are intact. Long scars both medial upper legs.   Skin: Warm and dry without trophic changes noted. There are no varicose veins.  Musculoskeletal: exam reveals no obvious joint deformities, tenderness or joint swelling or erythema.   Neurologically:  Mental status: The patient is awake, alert and oriented in all 4 spheres. Her immediate and remote memory, attention, language skills and fund of knowledge are appropriate. There is no evidence of aphasia, agnosia, apraxia or anomia. Speech is clear with normal prosody and enunciation. Thought process is linear. Mood is normal and affect is normal.  Cranial nerves II - XII are as described above under HEENT exam. In addition:  shoulder shrug is normal with equal shoulder height noted. Motor exam: Normal bulk, strength and tone is noted. There is no drift, tremor or rebound. Romberg is negative. Reflexes are 1-2+ throughout. Babinski: Toes are flexor bilaterally. Fine motor skills and coordination: intact with normal finger taps, normal hand movements, normal rapid alternating patting, normal foot taps and normal foot agility.  Cerebellar testing: No dysmetria or intention tremor on finger to nose testing. Heel to shin is unremarkable bilaterally. There is no truncal or gait ataxia.  Sensory exam: intact in the UEs and LEs to light touch.  Gait, station and balance: She stands easily. No veering to one side is noted. No leaning to one side is noted. Posture is age-appropriate and stance is narrow based. Gait shows normal stride length and normal pace. No problems turning are noted. Tandem walk is unremarkable. Intact toe and heel stance is noted.               Assessment and Plan:  In summary, AMARIONNA HASTINGS is a very pleasant 70 y.o.-year old female with an underlying medical history of peripheral vascular disease status post femoropopliteal bypasses (L in 12/14, and R in 3/17), prior smoking, hypertension, hyperlipidemia, reflux disease, depression, anxiety, who was previously diagnosed with obstructive sleep apnea over 10 years ago and placed on CPAP therapy. Her machine is rather old and she has had trouble using it. She would benefit from re-evaluation and treatment.  I had a long chat with the patient about my findings and the diagnosis of OSA, its prognosis and treatment options. We talked about medical treatments, surgical interventions and non-pharmacological approaches. I explained in particular the risks and ramifications of untreated moderate to severe OSA, especially with respect to developing cardiovascular disease down the Road, including congestive heart failure, difficult to treat hypertension, cardiac  arrhythmias, or stroke. Even type 2 diabetes has, in part, been linked to untreated OSA. Symptoms of untreated OSA include daytime sleepiness, memory problems, mood irritability and mood  disorder such as depression and anxiety, lack of energy, as well as recurrent headaches, especially morning headaches. We talked about nicotine cessation and trying to maintain a healthy lifestyle in general, as well as the importance of weight control. I encouraged the patient to eat healthy, exercise daily and keep well hydrated, to keep a scheduled bedtime and wake time routine, to not skip any meals and eat healthy snacks in between meals. I advised the patient not to drive when feeling sleepy. I recommended the following at this time: sleep study with potential positive airway pressure titration. (We will score hypopneas at 4% and split the sleep study into diagnostic and treatment portion, if the estimated. 2 hour AHI is >15/h).   I explained the sleep test procedure to the patient and also outlined possible surgical and non-surgical treatment options of OSA, including the use of a custom-made dental device (which would require a referral to a specialist dentist or oral surgeon), upper airway surgical options, such as pillar implants, radiofrequency surgery, tongue base surgery, and UPPP (which would involve a referral to an ENT surgeon). Rarely, jaw surgery such as mandibular advancement may be considered.  I also explained the CPAP treatment option to the patient, who indicated that she would be willing to try CPAP again if the need arises. I explained the importance of being compliant with PAP treatment, not only for insurance purposes but primarily to improve Her symptoms, and for the patient's long term health benefit, including to reduce Her cardiovascular risks. I answered all her questions today and the patient was in agreement. I would like to see her back after the sleep study is completed and encouraged her to  call with any interim questions, concerns, problems or updates.   Thank you very much for allowing me to participate in the care of this nice patient. If I can be of any further assistance to you please do not hesitate to call me at [phone].  Sincerely,   Star Age, MD, PhD

## 2016-08-21 NOTE — Patient Instructions (Signed)
Based on your symptoms and your exam I believe you still are at risk for obstructive sleep apnea or OSA, and I think we should proceed with a sleep study to determine whether you do or do not have OSA and how severe it is. If you have more than mild OSA, I want you to consider treatment with CPAP. Please remember, the risks and ramifications of moderate to severe obstructive sleep apnea or OSA are: Cardiovascular disease, including congestive heart failure, stroke, difficult to control hypertension, arrhythmias, and even type 2 diabetes has been linked to untreated OSA. Sleep apnea causes disruption of sleep and sleep deprivation in most cases, which, in turn, can cause recurrent headaches, problems with memory, mood, concentration, focus, and vigilance. Most people with untreated sleep apnea report excessive daytime sleepiness, which can affect their ability to drive. Please do not drive if you feel sleepy.   I will likely see you back after your sleep study to go over the test results and where to go from there. We will call you after your sleep study to advise about the results (most likely, you will hear from Beverlee Nims, my nurse) and to set up an appointment at the time, as necessary.    Our sleep lab administrative assistant, Arrie Aran will meet with you or call you to schedule your sleep study. If you don't hear back from her by next week please feel free to call her at [phone]. This is her direct line and please leave a message with your phone number to call back if you get the voicemail box. She will call back as soon as possible.

## 2016-08-22 DIAGNOSIS — Z23 Encounter for immunization: Secondary | ICD-10-CM | POA: Diagnosis not present

## 2016-08-22 DIAGNOSIS — G4700 Insomnia, unspecified: Secondary | ICD-10-CM | POA: Diagnosis not present

## 2016-08-22 DIAGNOSIS — F419 Anxiety disorder, unspecified: Secondary | ICD-10-CM | POA: Diagnosis not present

## 2016-08-22 DIAGNOSIS — J449 Chronic obstructive pulmonary disease, unspecified: Secondary | ICD-10-CM | POA: Diagnosis not present

## 2016-08-22 DIAGNOSIS — E7800 Pure hypercholesterolemia, unspecified: Secondary | ICD-10-CM | POA: Diagnosis not present

## 2016-08-22 DIAGNOSIS — I10 Essential (primary) hypertension: Secondary | ICD-10-CM | POA: Diagnosis not present

## 2016-08-22 DIAGNOSIS — E119 Type 2 diabetes mellitus without complications: Secondary | ICD-10-CM | POA: Diagnosis not present

## 2016-09-08 ENCOUNTER — Ambulatory Visit (INDEPENDENT_AMBULATORY_CARE_PROVIDER_SITE_OTHER): Payer: Medicare Other | Admitting: Neurology

## 2016-09-08 DIAGNOSIS — G4720 Circadian rhythm sleep disorder, unspecified type: Secondary | ICD-10-CM

## 2016-09-08 DIAGNOSIS — G4710 Hypersomnia, unspecified: Secondary | ICD-10-CM

## 2016-09-08 DIAGNOSIS — G4761 Periodic limb movement disorder: Secondary | ICD-10-CM

## 2016-09-15 ENCOUNTER — Telehealth: Payer: Self-pay | Admitting: Neurology

## 2016-09-15 NOTE — Progress Notes (Signed)
PATIENT'S NAME:  Jill, Higgins DOB:      19-Apr-1946      MR#:    [id number]     DATE OF RECORDING: 09/08/2016 REFERRING M.D.:  Carol Ada, MD Study Performed:   Baseline Polysomnogram HISTORY: 70 year old right-handed woman with an underlying medical history of peripheral vascular disease status post femoropopliteal bypasses (L in 12/14, and R in 3/17), prior smoking, hypertension, hyperlipidemia, reflux disease, depression, anxiety, who was previously diagnosed with obstructive sleep apnea over 10 years ago and placed on CPAP therapy. The patient endorsed the Epworth Sleepiness Scale at 11 points.  The patient's weight 142 pounds with a height of 63 (inches), resulting in a BMI of 25 kg/m2. The patient's neck circumference measured 14.8 inches.  CURRENT MEDICATIONS: Aspirin, Atenolol, Neurontin, Amaryl, Xalatan, Mevacor, Metformin, Protonix, Proair, Restoril.   PROCEDURE:  This is a multichannel digital polysomnogram utilizing the Somnostar 11.2 system.  Electrodes and sensors were applied and monitored per AASM Specifications.   EEG, EOG, Chin and Limb EMG, were sampled at 200 Hz.  ECG, Snore and Nasal Pressure, Thermal Airflow, Respiratory Effort, CPAP Flow and Pressure, Oximetry was sampled at 50 Hz. Digital video and audio were recorded.      BASELINE STUDY  Lights Out was at 20:58 and Lights On at 05:01.  Total recording time (TRT) was 483.5 minutes, with a total sleep time (TST) of  405.5 minutes.   The patient's sleep latency was 39 minutes, which is prolonged.  REM latency was 69 minutes, which is borderline low.  The sleep efficiency was 83.9 %.     SLEEP ARCHITECTURE: WASO (Wake after sleep onset) was 39 minutes, with mild to moderate sleep fragmentation noted. There were 24.5 minutes in Stage N1, 208.5 minutes Stage N2, 111.5 minutes Stage N3 and 61 minutes in Stage REM.  The percentage of Stage N1 was 6.%, Stage N2 was 51.4%, Stage N3 was 27.5% (mildly increased) and Stage R (REM  sleep) was 15.%, mildly reduced.   The video and audio analysis did not show any abnormal or unusual behaviors, movements, or vocalizations.   Mild intermittent snoring was noted.   RESPIRATORY ANALYSIS:  There were a total of 17 respiratory events:  0 obstructive apneas, 0 central apneas and 0 mixed apneas with a total of 0 apneas and an apnea index (AI) of 0 /hour. There were 17 hypopneas with a hypopnea index of 2.5 /hour. The patient also had 0 respiratory event related arousals (RERAs).      The total APNEA/HYPOPNEA INDEX (AHI) was 2.5/hour and the total RESPIRATORY DISTURBANCE INDEX was 2.5 /hour.  7 events occurred in REM sleep and 20 events in NREM. The REM AHI was 6.9 /hour, versus a non-REM AHI of 1.7. The patient spent 193 minutes of total sleep time in the supine position and 213 minutes in non-supine.. The supine AHI was 5.3 versus a non-supine AHI of 0.0.  OXYGEN SATURATION & C02:  The baseline 02 saturation was 98%, with the lowest being 87%. Time spent below 89% saturation equaled 4 minutes.   PERIODIC LIMB MOVEMENTS:   The patient had a total of 282 Periodic Limb Movements.  The Periodic Limb Movement (PLM) index was 41.7 and the PLM Arousal index was 3/hour.  IMPRESSION:  1.  Periodic Limb Movement Disorder  2.  Dysfunctions associated with sleep stages or arousal from sleep  RECOMMENDATIONS:  1. Moderate PLMs (periodic limb movements of sleep) were noted during the study without significant arousals; clinical correlation is recommended.  2. This study does not demonstrate any significant obstructive or central sleep disordered breathing. Causes for daytime sleepiness may include RLS/PLMs, circadian rhythm disturbances, an underlying mood disorder, medication effect and/or an underlying medical problem cannot be ruled out. 3. The patient should be cautioned not to drive, work at heights, or operate dangerous or heavy equipment when tired or sleepy. Review and reiteration of  good sleep hygiene measures should be pursued with any patient. 4. This study shows sleep fragmentation and abnormal sleep stage percentages; these are nonspecific findings and per se do not signify an intrinsic sleep disorder or a cause for the patient's sleep-related symptoms. Causes include (but are not limited to) the first night effect of the sleep study, circadian rhythm disturbances, medication effect or an underlying mood disorder or medical problem.  5. The patient will be seen in follow-up by Dr. Rexene Alberts at [facility] for discussion of the test results and further management strategies. The referring provider will be notified of the test results.    I certify that I have reviewed the entire raw data recording prior to the issuance of this report in accordance with the Standards of Accreditation of the [place] [employer] (AASM)       Star Age, MD,PhD Diplomat, American Board of Psychiatry and Neurology  Diplomat, [place] [employer]

## 2016-09-15 NOTE — Telephone Encounter (Signed)
Patient referred by Dr. Everlene Farrier, seen by me on 08/21/16, diagnostic PSG on 09/08/16.   Please call and notify the patient that the recent sleep study did not show any significant obstructive sleep apnea. Please inform patient that I would like to go over the details of the study during a follow up appointment. Arrange a followup appointment. Also, route or fax report to PCP and referring MD, if other than PCP.  Once you have spoken to patient, you can close this encounter.   Thanks,  Star Age, MD, PhD [facility])

## 2016-09-18 NOTE — Telephone Encounter (Signed)
I spoke to patient and she is aware of results. I made her an appointment for this week.

## 2016-09-21 ENCOUNTER — Ambulatory Visit (INDEPENDENT_AMBULATORY_CARE_PROVIDER_SITE_OTHER): Payer: Medicare Other | Admitting: Neurology

## 2016-09-21 ENCOUNTER — Encounter: Payer: Self-pay | Admitting: Neurology

## 2016-09-21 VITALS — BP 136/88 | HR 76 | Resp 16 | Ht 63.0 in | Wt 145.0 lb

## 2016-09-21 DIAGNOSIS — I739 Peripheral vascular disease, unspecified: Secondary | ICD-10-CM

## 2016-09-21 DIAGNOSIS — G479 Sleep disorder, unspecified: Secondary | ICD-10-CM | POA: Diagnosis not present

## 2016-09-21 DIAGNOSIS — G4761 Periodic limb movement disorder: Secondary | ICD-10-CM | POA: Diagnosis not present

## 2016-09-21 DIAGNOSIS — G629 Polyneuropathy, unspecified: Secondary | ICD-10-CM | POA: Diagnosis not present

## 2016-09-21 NOTE — Progress Notes (Signed)
Subjective:    Patient ID: Jill Higgins is a 70 y.o. female.  HPI     Interim history:   Jill Higgins is a 69 year old right-handed woman with an underlying medical history of peripheral vascular disease status post femoropopliteal bypasses (L in 12/14, and R in 3/17), prior smoking, hypertension, hyperlipidemia, reflux disease, diabetes, depression, anxiety, who presents for follow-up consultation of her sleep disorder, after her recent sleep study. The patient is unaccompanied today. I first met her on 08/21/2016 at the request of her primary care physician, at which time she reported a prior diagnosis of OSA. I invited her for sleep study. She had a baseline sleep study on 09/08/2016. I went over her test results with her in detail today. Sleep latency was prolonged at 39 minutes and sleep efficiency was 83.9%. REM latency was 69 minutes. Wake after sleep onset was 39 minutes with mild to moderate sleep fragmentation noted. She had an increased percentage mildly of slow-wave sleep and REM sleep was mildly reduced at 15%. She had mild intermittent snoring. Total AHI was 2.5 per hour, REM AHI mildly elevated at 6.9 per hour. Supine AHI was borderline at 5.3 per hour. Average oxygen saturation was 98%, nadir was 87%. She had moderate PLMS with an index of 41.7 per hour and an associated arousal index of 3 per hour.  Today, 09/21/2016: She reports discomfort in both legs, but not necessarily in keeping with RLS, but more so due to pain with her PAD and PN. Diabetes control has improved with time. She has been on gabapentin 3 times a day. She does not endorse any urges to move her legs and it does not help to walk around at night or move her legs. She is often unaware of her leg movements but does report restless sleep and tossing and turning a lot. She feels that she slept better during the sleep study than typically at home.  Previously:  08/21/2016: She was previously diagnosed with obstructive  sleep apnea over 10 years ago and placed on CPAP therapy. Sleep study was done in [place]. Prior test results are not available for my review. She reports not using her CPAP regularly and while she brought the machine, a download was not possible from her machine.   She has an old CPAP machine, Estimates that her machine is about 71 years old, REMstar Plus. She feels uncomfortable with it.  She quit smoking since late 2015, now on e-cig. She was recently treated for bronchitis, with ABx and inhalers. Still has residual Cough and congestion. She has difficulty maintaining sleep and snores loudly. She denies restless leg symptoms. She lives alone. She is divorced. She has 1 grown daughter and one grandson. She drinks alcohol very occasionally and drinks coffee 2 cups per day and has reduced her soda intake.  She retired from [employer]. She has not been keeping a very set scheduled for her sleep and wake time. She was noted to have evidence of tooth grinding and recently noticed that she had chipped her right front tooth. She is going to have it repaired.   Her Past Medical History Is Significant For: Past Medical History:  Diagnosis Date  . Bronchitis   . Bulging disc    in neck  . Depression with anxiety   . Diabetes mellitus    Type II  . Fibromyalgia    pt. denies  . GERD (gastroesophageal reflux disease)   . Glaucoma   . History of bronchitis   .  Hyperlipidemia   . Hypertension   . Overactive bladder   . Peripheral vascular disease ([place])   . Pneumonia   . Sleep apnea   . Tobacco abuse   . Urinary frequency     Her Past Surgical History Is Significant For: Past Surgical History:  Procedure Laterality Date  . ABDOMINAL AORTAGRAM N/A 08/15/2013   Procedure: ABDOMINAL Maxcine Ham;  Surgeon: Elam Dutch, MD;  Location: [facility] CATH LAB;  Service: Cardiovascular;  Laterality: N/A;  . ABDOMINAL AORTAGRAM N/A 06/19/2014   Procedure: ABDOMINAL Maxcine Ham;  Surgeon: Elam Dutch,  MD;  Location: [facility] CATH LAB;  Service: Cardiovascular;  Laterality: N/A;  . Aortogram w/ PTA  05/14/08,  11-04-10   Bilateral aortogram w/ bilateral  SFA PTA  stenting   . BREAST SURGERY Right    boil removal  . BUNIONECTOMY     L foot in the 1980s  . COLONOSCOPY     2014  . DILATION AND CURETTAGE OF UTERUS     X4  . Epidural shots in neck     . EYE SURGERY Bilateral 2016   cataract removal  . FEMORAL-POPLITEAL BYPASS GRAFT Left 10/21/2013   Procedure: LEFT FEMORAL-POPLITEAL ARTERY BYPASS WITH SAPHENOUS VEIN GRAFT , POPLITEAL ENDARTERECTOMY ,INTRAOPERATIVE ARTERIOGRAM, vein patch angioplasty to popliteal artery;  Surgeon: Elam Dutch, MD;  Location: [facility];  Service: Vascular;  Laterality: Left;  . FEMORAL-POPLITEAL BYPASS GRAFT Right 02/08/2016   Procedure: Right FEMORAL- to Above Knee POPLITEAL ARTERY Bypass Graft with reversed saphenous vein and Common Femoral Endarterectomy  with profundoplasty;  Surgeon: Elam Dutch, MD;  Location: [place];  Service: Vascular;  Laterality: Right;  . GROIN DEBRIDEMENT Left 10/28/2013   Procedure: left inner thigh DEBRIDEMENT;  Surgeon: Elam Dutch, MD;  Location: [place];  Service: Vascular;  Laterality: Left;  . HAND SURGERY Left 2010  . LOWER EXTREMITY ANGIOGRAM Bilateral 12/24/2015   Procedure: Lower Extremity Angiogram;  Surgeon: Elam Dutch, MD;  Location: [place] CV LAB;  Service: Cardiovascular;  Laterality: Bilateral;  . PERIPHERAL VASCULAR CATHETERIZATION N/A 12/24/2015   Procedure: Abdominal Aortogram;  Surgeon: Elam Dutch, MD;  Location: [place] CV LAB;  Service: Cardiovascular;  Laterality: N/A;  . PERIPHERAL VASCULAR CATHETERIZATION Left 12/24/2015   Procedure: Peripheral Vascular Balloon Angioplasty;  Surgeon: Elam Dutch, MD;  Location: [place] CV LAB;  Service: Cardiovascular;  Laterality: Left;  drug coated balloon  . PERIPHERAL VASCULAR CATHETERIZATION N/A 06/30/2016   Procedure: Abdominal Aortogram;   Surgeon: Elam Dutch, MD;  Location: [place] CV LAB;  Service: Cardiovascular;  Laterality: N/A;  . PERIPHERAL VASCULAR CATHETERIZATION Bilateral 06/30/2016   Procedure: Lower Extremity Angiography;  Surgeon: Elam Dutch, MD;  Location: [place] CV LAB;  Service: Cardiovascular;  Laterality: Bilateral;  . TONSILLECTOMY      Her Family History Is Significant For: Family History  Problem Relation Age of Onset  . Heart disease Father   . Heart attack Father     MI at age 70  . Hypertension Mother   . Alzheimer's disease Mother   . Hypertension Sister   . Diabetes Brother   . Hyperlipidemia Brother   . Hypertension Brother   . Heart disease Brother   . Heart attack Brother     Her Social History Is Significant For: Social History   Social History  . Marital status: Single    Spouse name: N/A  . Number of children: 1  . Years of education: N/A  Occupational History  . Retired Sales promotion account executive   Social History Main Topics  . Smoking status: Former Smoker    Packs/day: 0.10    Types: Cigarettes    Quit date: 10/2014  . Smokeless tobacco: Never Used     Comment: pt states that she is using the E cig only   . Alcohol use 0.0 - 0.6 oz/week     Comment: Occasional  . Drug use: No  . Sexual activity: Not Asked   Other Topics Concern  . None   Social History Narrative   Drinks about 2 cups of coffee a day, and some tea     Her Allergies Are:  Allergies  Allergen Reactions  . Latex Rash  . Adhesive [Tape] Other (See Comments)    Pt states that tape and electrodes leave red "scars" on her skin and her skin is very sensitive.  :   Her Current Medications Are:  Outpatient Encounter Prescriptions as of 09/21/2016  Medication Sig  . aspirin EC 81 MG tablet Take 81 mg by mouth daily.  Marland Kitchen bismuth subsalicylate (PEPTO BISMOL) 262 MG/15ML suspension Take 30 mLs by mouth every 6 (six) hours as needed for indigestion or diarrhea or loose stools. Reported on 11/04/2015  .  brimonidine (ALPHAGAN) 0.2 % ophthalmic solution   . CARAFATE 1 GM/10ML suspension Take 10 mLs by mouth 4 (four) times daily as needed. (ACID REFLUX)  . Cyanocobalamin (VITAMIN B-12) 5000 MCG TBDP Take by mouth.  . gabapentin (NEURONTIN) 300 MG capsule Take 1 capsule (300 mg total) by mouth 3 (three) times daily.  Marland Kitchen glimepiride (AMARYL) 4 MG tablet Take 4 mg by mouth daily with breakfast.  . latanoprost (XALATAN) 0.005 % ophthalmic solution Place 1 drop into both eyes at bedtime.  . lovastatin (MEVACOR) 20 MG tablet Take 20 mg by mouth every morning.   . metFORMIN (GLUCOPHAGE) 1000 MG tablet Take 1,000 mg by mouth 2 (two) times daily with a meal.  . metoprolol succinate (TOPROL-XL) 50 MG 24 hr tablet   . pantoprazole (PROTONIX) 40 MG tablet Take 40 mg by mouth daily.  Marland Kitchen PROAIR HFA 108 (90 BASE) MCG/ACT inhaler Inhale 2 puffs into the lungs every 4 (four) hours as needed for wheezing or shortness of breath. Reported on 11/04/2015  . temazepam (RESTORIL) 30 MG capsule Take 30 mg by mouth at bedtime.  . [DISCONTINUED] atenolol (TENORMIN) 50 MG tablet Take 50 mg by mouth daily.   No facility-administered encounter medications on file as of 09/21/2016.   :  Review of Systems:  Out of a complete 14 point review of systems, all are reviewed and negative with the exception of these symptoms as listed below: Review of Systems  Neurological:       Patient is here to discuss sleep study. No new concerns.     Objective:  Neurologic Exam  Physical Exam Physical Examination:   Vitals:   09/21/16 0836  BP: 136/88  Pulse: 76  Resp: 16   General Examination: The patient is a very pleasant 70 y.o. female in no acute distress. She appears well-developed and well-nourished and well groomed.    HEENT: Normocephalic, atraumatic, pupils are equal, round and reactive to light and accommodation. She is s/p bilateral cataract repairs. Extraocular tracking is good without limitation to gaze excursion or  nystagmus noted. Normal smooth pursuit is noted. Hearing is grossly intact. Face is symmetric with normal facial animation and normal facial sensation. Speech is clear with no dysarthria noted. There is no  hypophonia. There is no lip, neck/head, jaw or voice tremor. Neck is supple with full range of passive and active motion. There are no carotid bruits on auscultation. Oropharynx exam reveals: mild mouth dryness, adequate dental hygiene, but chipped R front tooth from tooth grinding. Moderate airway crowding, due to redundant soft palate and larger uvula. Mallampati is class II.   Chest: Clear to auscultation with mild wheezing and rhonchi noted, no crackles.  Heart: S1+S2+0, regular and normal without murmurs, rubs or gallops noted.   Abdomen: Soft, non-tender and non-distended with normal bowel sounds appreciated on auscultation.  Extremities: There is no pitting edema in the distal lower extremities bilaterally. Pedal pulses are intact. Long scars both medial upper legs, left sided scar is more extensive than right.   Skin: Warm and dry without trophic changes noted. There are no varicose veins.  Musculoskeletal: exam reveals no obvious joint deformities, tenderness or joint swelling or erythema.   Neurologically:  Mental status: The patient is awake, alert and oriented in all 4 spheres. Her immediate and remote memory, attention, language skills and fund of knowledge are appropriate. There is no evidence of aphasia, agnosia, apraxia or anomia. Speech is clear with normal prosody and enunciation. Thought process is linear. Mood is normal and affect is normal.  Cranial nerves II - XII are as described above under HEENT exam. In addition: shoulder shrug is normal with equal shoulder height noted. Motor exam: Normal bulk, strength and tone is noted. There is no drift, tremor or rebound. Romberg is negative. Reflexes are 1-2+ throughout. Babinski: Toes are flexor bilaterally. Fine motor skills and  coordination: intact with normal finger taps, normal hand movements, normal rapid alternating patting, normal foot taps and normal foot agility.  Cerebellar testing: No dysmetria or intention tremor on finger to nose testing. Heel to shin is unremarkable bilaterally. There is no truncal or gait ataxia.  Sensory exam: intact in the UEs and LEs to light touch, but decrease to all modalities in the distal lower extremities, left worse than right.  Gait, station and balance: She stands easily. No veering to one side is noted. No leaning to one side is noted. Posture is age-appropriate and stance is narrow based. Gait shows normal stride length and normal pace. No problems turning are noted. Tandem walk is unremarkable.  Assessment and Plan:  In summary, ORLANDO THALMANN is a very pleasant 70 year old female with an underlying medical history of peripheral vascular disease status post femoropopliteal bypasses (L in 12/14, and R in 3/17), prior smoking, hypertension, hyperlipidemia, reflux disease, diabetes, depression, anxiety, who was previously diagnosed with obstructive sleep apnea over 10 years ago and placed on CPAP therapy.She had a recent baseline sleep study on 09/08/2016 which did not demonstrate any significant obstructive sleep disordered breathing. She had PLMS with minimal arousals, indicates no overt symptoms of restless leg syndrome but rather pain and discomfort and numbness at night which results often in frequent position changes and restless sleep. She indicates that she actually slept a little better in the sleep lab then at home, also indicating stressors at home. We talked about her sleep study results in detail. She is advised that CPAP therapy is not warranted at this time. She is reminded to maintain good sleep hygiene and stay active. She is on gabapentin for neuropathy and leg pain. I did not suggest any new medications at this time and suggested as needed follow-up. Exam is stable from my  end of things. I answered all her  questions today and the patient was in agreement.  I spent 25 minutes in total face-to-face time with the patient, more than 50% of which was spent in counseling and coordination of care, reviewing test results, reviewing medication and discussing or reviewing the diagnosis of PLMD, sleep disturbance, the prognosis and treatment options.

## 2016-09-21 NOTE — Patient Instructions (Signed)
Please remember to try to maintain good sleep hygiene, which means: Keep a regular sleep and wake schedule, try not to exercise or have a meal within 2 hours of your bedtime, try to keep your bedroom conducive for sleep, that is, cool and dark, without light distractors such as an illuminated alarm clock, and refrain from watching TV right before sleep or in the middle of the night and do not keep the TV or radio on during the night. Also, try not to use or play on electronic devices at bedtime, such as your cell phone, tablet PC or laptop. If you like to read at bedtime on an electronic device, try to dim the background light as much as possible. Do not eat in the middle of the night.   Your recent sleep study did not show any significant obstructive sleep apnea. You have an overall normal AHI, and thus, CPAP therapy is not necessary. I would recommend that you try to sleep on your sides and try to lose some weight.   You have evidence of nerve damage, continue with your gabapentin.   You have leg movements in sleep and since you don't have true restless legs symptoms, these leg movements are likely due to discomfort from your peripheral artery disease and neuropathy.

## 2016-09-22 DIAGNOSIS — M7751 Other enthesopathy of right foot: Secondary | ICD-10-CM | POA: Diagnosis not present

## 2016-09-22 DIAGNOSIS — M7752 Other enthesopathy of left foot: Secondary | ICD-10-CM | POA: Diagnosis not present

## 2016-09-22 DIAGNOSIS — M25572 Pain in left ankle and joints of left foot: Secondary | ICD-10-CM | POA: Diagnosis not present

## 2016-09-22 DIAGNOSIS — M25571 Pain in right ankle and joints of right foot: Secondary | ICD-10-CM | POA: Diagnosis not present

## 2016-09-29 ENCOUNTER — Encounter: Payer: Self-pay | Admitting: Vascular Surgery

## 2016-10-05 ENCOUNTER — Encounter: Payer: Self-pay | Admitting: Vascular Surgery

## 2016-10-05 ENCOUNTER — Ambulatory Visit (HOSPITAL_COMMUNITY)
Admission: RE | Admit: 2016-10-05 | Discharge: 2016-10-05 | Disposition: A | Discharge status: Home / Self Care | Payer: Medicare Other | Source: Ambulatory Visit | Attending: Vascular Surgery | Admitting: Vascular Surgery

## 2016-10-05 ENCOUNTER — Ambulatory Visit (INDEPENDENT_AMBULATORY_CARE_PROVIDER_SITE_OTHER): Payer: Medicare Other | Admitting: Vascular Surgery

## 2016-10-05 VITALS — BP 136/79 | HR 61 | Temp 97.7°F | Resp 18 | Ht 63.0 in | Wt 143.9 lb

## 2016-10-05 DIAGNOSIS — I739 Peripheral vascular disease, unspecified: Secondary | ICD-10-CM | POA: Insufficient documentation

## 2016-10-05 DIAGNOSIS — Z9862 Peripheral vascular angioplasty status: Secondary | ICD-10-CM | POA: Insufficient documentation

## 2016-10-05 NOTE — Progress Notes (Signed)
HISTORY AND PHYSICAL     CC:  Follow up  Referring Provider:  Carol Ada, MD  HPI: This is a 70 y.o. female who is s/p bilateral femoral to popliteal bypass grafts.  When she presented in August, she denied any claudication sx and she continues to be without any claudication.  She does however continue to have numbness and tingling around her right knee and calf.  She is taking Neurontin 300mg  tid.  She does not have any non healing wounds or rest pain.    At her last visit, she did have a possible narrowing of her right femoral to popliteal bypass graft.  She underwent an arteriogram in August 2017, which revealed a 50-70% stenosis of the right distal external iliac artery and a patent bilateral femoral popliteal bypass graft with primary runoff via the anterior tibial to dorsalis pedis artery but proximally patent all 3 vessels.  She did not have an intervention.  She is on a statin for cholesterol management.  She is on oral agents for diabetes.  She takes a daily aspirin.  She is on a beta blocker for blood pressure management.    She quit smoking in 2014.  Past Medical History:  Diagnosis Date  . Bronchitis   . Bulging disc    in neck  . Depression with anxiety   . Diabetes mellitus    Type II  . Fibromyalgia    pt. denies  . GERD (gastroesophageal reflux disease)   . Glaucoma   . History of bronchitis   . Hyperlipidemia   . Hypertension   . Overactive bladder   . Peripheral vascular disease ([place])   . Pneumonia   . Sleep apnea   . Tobacco abuse   . Urinary frequency     Past Surgical History:  Procedure Laterality Date  . ABDOMINAL AORTAGRAM N/A 08/15/2013   Procedure: ABDOMINAL Maxcine Ham;  Surgeon: Elam Dutch, MD;  Location: [facility] CATH LAB;  Service: Cardiovascular;  Laterality: N/A;  . ABDOMINAL AORTAGRAM N/A 06/19/2014   Procedure: ABDOMINAL Maxcine Ham;  Surgeon: Elam Dutch, MD;  Location: [facility] CATH LAB;  Service: Cardiovascular;  Laterality: N/A;  .  Aortogram w/ PTA  05/14/08,  11-04-10   Bilateral aortogram w/ bilateral  SFA PTA  stenting   . BREAST SURGERY Right    boil removal  . BUNIONECTOMY     L foot in the 1980s  . COLONOSCOPY     2014  . DILATION AND CURETTAGE OF UTERUS     X4  . Epidural shots in neck     . EYE SURGERY Bilateral 2016   cataract removal  . FEMORAL-POPLITEAL BYPASS GRAFT Left 10/21/2013   Procedure: LEFT FEMORAL-POPLITEAL ARTERY BYPASS WITH SAPHENOUS VEIN GRAFT , POPLITEAL ENDARTERECTOMY ,INTRAOPERATIVE ARTERIOGRAM, vein patch angioplasty to popliteal artery;  Surgeon: Elam Dutch, MD;  Location: [facility] OR;  Service: Vascular;  Laterality: Left;  . FEMORAL-POPLITEAL BYPASS GRAFT Right 02/08/2016   Procedure: Right FEMORAL- to Above Knee POPLITEAL ARTERY Bypass Graft with reversed saphenous vein and Common Femoral Endarterectomy  with profundoplasty;  Surgeon: Elam Dutch, MD;  Location: [place];  Service: Vascular;  Laterality: Right;  . GROIN DEBRIDEMENT Left 10/28/2013   Procedure: left inner thigh DEBRIDEMENT;  Surgeon: Elam Dutch, MD;  Location: [place];  Service: Vascular;  Laterality: Left;  . HAND SURGERY Left 2010  . LOWER EXTREMITY ANGIOGRAM Bilateral 12/24/2015   Procedure: Lower Extremity Angiogram;  Surgeon: Elam Dutch, MD;  Location: [facility]  INVASIVE CV LAB;  Service: Cardiovascular;  Laterality: Bilateral;  . PERIPHERAL VASCULAR CATHETERIZATION N/A 12/24/2015   Procedure: Abdominal Aortogram;  Surgeon: Elam Dutch, MD;  Location: [place] CV LAB;  Service: Cardiovascular;  Laterality: N/A;  . PERIPHERAL VASCULAR CATHETERIZATION Left 12/24/2015   Procedure: Peripheral Vascular Balloon Angioplasty;  Surgeon: Elam Dutch, MD;  Location: [place] CV LAB;  Service: Cardiovascular;  Laterality: Left;  drug coated balloon  . PERIPHERAL VASCULAR CATHETERIZATION N/A 06/30/2016   Procedure: Abdominal Aortogram;  Surgeon: Elam Dutch, MD;  Location: [place] CV LAB;  Service:  Cardiovascular;  Laterality: N/A;  . PERIPHERAL VASCULAR CATHETERIZATION Bilateral 06/30/2016   Procedure: Lower Extremity Angiography;  Surgeon: Elam Dutch, MD;  Location: [place] CV LAB;  Service: Cardiovascular;  Laterality: Bilateral;  . TONSILLECTOMY      Allergies  Allergen Reactions  . Latex Rash  . Adhesive [Tape] Other (See Comments)    Pt states that tape and electrodes leave red "scars" on her skin and her skin is very sensitive.    Current Outpatient Prescriptions  Medication Sig Dispense Refill  . aspirin EC 81 MG tablet Take 81 mg by mouth daily.    Marland Kitchen bismuth subsalicylate (PEPTO BISMOL) 262 MG/15ML suspension Take 30 mLs by mouth every 6 (six) hours as needed for indigestion or diarrhea or loose stools. Reported on 11/04/2015    . brimonidine (ALPHAGAN) 0.2 % ophthalmic solution     . CARAFATE 1 GM/10ML suspension Take 10 mLs by mouth 4 (four) times daily as needed. (ACID REFLUX)  3  . Cyanocobalamin (VITAMIN B-12) 5000 MCG TBDP Take by mouth.    . gabapentin (NEURONTIN) 300 MG capsule Take 1 capsule (300 mg total) by mouth 3 (three) times daily. 90 capsule 6  . glimepiride (AMARYL) 4 MG tablet Take 4 mg by mouth daily with breakfast.    . latanoprost (XALATAN) 0.005 % ophthalmic solution Place 1 drop into both eyes at bedtime.    . lovastatin (MEVACOR) 20 MG tablet Take 20 mg by mouth every morning.     . metFORMIN (GLUCOPHAGE) 1000 MG tablet Take 1,000 mg by mouth 2 (two) times daily with a meal.    . metoprolol succinate (TOPROL-XL) 50 MG 24 hr tablet     . pantoprazole (PROTONIX) 40 MG tablet Take 40 mg by mouth daily.    Marland Kitchen PROAIR HFA 108 (90 BASE) MCG/ACT inhaler Inhale 2 puffs into the lungs every 4 (four) hours as needed for wheezing or shortness of breath. Reported on 11/04/2015    . temazepam (RESTORIL) 30 MG capsule Take 30 mg by mouth at bedtime.     No current facility-administered medications for this visit.     Family History  Problem Relation  Age of Onset  . Heart disease Father   . Heart attack Father     MI at age 66  . Hypertension Mother   . Alzheimer's disease Mother   . Hypertension Sister   . Diabetes Brother   . Hyperlipidemia Brother   . Hypertension Brother   . Heart disease Brother   . Heart attack Brother     Social History   Social History  . Marital status: Single    Spouse name: N/A  . Number of children: 1  . Years of education: N/A   Occupational History  . Retired Sales promotion account executive   Social History Main Topics  . Smoking status: Former Smoker    Packs/day: 0.10    Types:  Cigarettes    Quit date: 10/2014  . Smokeless tobacco: Never Used     Comment: pt states that she is using the E cig only   . Alcohol use 0.0 - 0.6 oz/week     Comment: Occasional  . Drug use: No  . Sexual activity: Not on file   Other Topics Concern  . Not on file   Social History Narrative   Drinks about 2 cups of coffee a day, and some tea      REVIEW OF SYSTEMS:   [X]  denotes positive finding, [ ]  denotes negative finding Cardiac  Comments:  Chest pain or chest pressure:    Shortness of breath upon exertion:    Short of breath when lying flat:    Irregular heart rhythm:        Vascular    Pain in calf, thigh, or hip brought on by ambulation:    Pain in feet at night that wakes you up from your sleep:     Blood clot in your veins:    Leg swelling:         Pulmonary    Oxygen at home:    Productive cough:     Wheezing:         Neurologic    Sudden weakness in arms or legs:     numbness in arms or legs:  x Right leg---See HPI  Sudden onset of difficulty speaking or slurred speech:    Temporary loss of vision in one eye:     Problems with dizziness:         Gastrointestinal    Blood in stool:     Vomited blood:         Genitourinary    Burning when urinating:     Blood in urine:        Psychiatric    Major depression:         Hematologic    Bleeding problems:    Problems with blood clotting too  easily:        Skin    Rashes or ulcers:        Constitutional    Fever or chills:      PHYSICAL EXAMINATION:  Vitals:   10/05/16 1505  BP: 136/79  Pulse: 61  Resp: 18  Temp: 97.7 F (36.5 C)   Body mass index is 25.49 kg/m.  General:  WDWN in NAD; vital signs documented above Gait: Not observed HENT: WNL, normocephalic Pulmonary: normal non-labored breathing , without Rales, rhonchi,  wheezing Cardiac: regular HR Skin: without rashes Vascular Exam/Pulses:  Right Left  Femoral 2+ (normal) 2+ (normal)  DP Unable to palpate  2+ (normal)  PT Unable to palpate  Unable to palpate    Extremities: without ischemic changes, without Gangrene , without cellulitis; without open wounds;  Musculoskeletal: no muscle wasting or atrophy  Neurologic: A&O X 3;  No focal weakness or paresthesias are detected Psychiatric:  The pt has Normal affect.   Non-Invasive Vascular Imaging:   ABI's 10/05/16: Right:  0.80 Left:  1.08  TBI's 10/05/16: Right:  0.64 Left:  0.67  Lower extremity arterial duplex evaluation 10/05/16: No significant change since study on 06/28/16 (See Report)  Pt meds includes: Statin:  Yes.   Beta Blocker:  Yes.   Aspirin:  Yes.   ACEI:  No. ARB:  No. Other Antiplatelet/Anticoagulant:  No.    ASSESSMENT/PLAN:: 70 y.o. female with hx of bilateral femoral to popliteal bypass grafts with  decreased ABI on the right with 50-70% narrowing of the distal right external iliac artery   -her ABI and arterial duplex are essentially unchanged from her previous visit.  She continues to not have claudication.   -she does continue to have the numbness/tingling in her right leg.  She is on Neuronitn 300mg  tid, which she will continue -she will f/u with Dr. Oneida Alar in 3 months with repeat ABI's and bilateral arterial duplex.  She will call sooner if she develops any claudication or non healing wounds.    Leontine Locket, PA-C [facility] [phone]  Clinic MD:  Pt seen and examined in conjunction with Dr. Oneida Alar   History and exam findings as above. Patient does have a known mild to moderate right external iliac artery stenosis above her femoropopliteal bypass graft. Currently she is asymptomatic. She has symmetric femoral pulses. Her ABIs have declined slightly. However, on her arteriogram there is not much room to angioplasty the external iliac artery above the bypass and I believe this would be fairly high risk. She'll return in 3 months time for repeat duplex and ABIs. She'll return sooner she develops recurrent claudication symptoms.  Ruta Hinds, MD [facility] [place] Office: [phone] Pager: [phone]

## 2016-10-09 NOTE — Addendum Note (Signed)
Addended by: Lianne Cure A on: 10/09/2016 08:54 AM   Modules accepted: Orders

## 2016-10-25 DIAGNOSIS — M7751 Other enthesopathy of right foot: Secondary | ICD-10-CM | POA: Diagnosis not present

## 2016-10-25 DIAGNOSIS — M25571 Pain in right ankle and joints of right foot: Secondary | ICD-10-CM | POA: Diagnosis not present

## 2016-11-11 IMAGING — MR MR CERVICAL SPINE W/O CM
4 of 5 series · 28 of 48 positions shown · non-contrast
Comparison: None.

CLINICAL DATA: Neck pain after walking up stairs.  Right arm pain.

EXAM:
MRI CERVICAL SPINE WITHOUT CONTRAST
TECHNIQUE: Multiplanar, multisequence MR imaging of the cervical spine was
performed. No intravenous contrast was administered.

[Series 3: T2 · sagittal · 3.0mm · 0.66mm/px · 6 of 13 slices shown (1 of 2)]
[im 1/13]
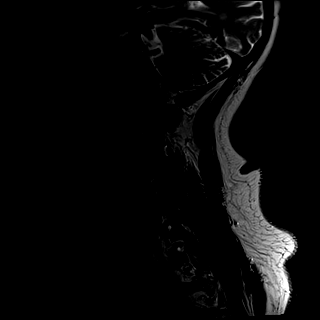
[im 3/13]
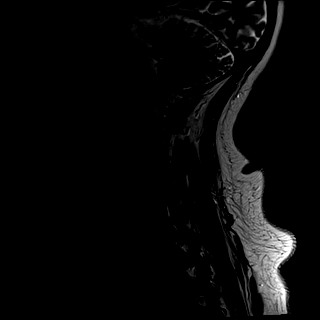
[im 5/13]
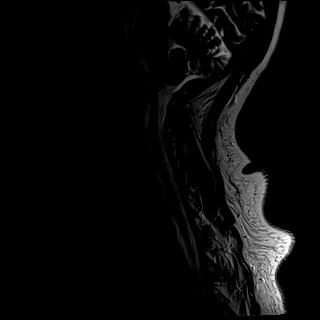
[im 8/13]
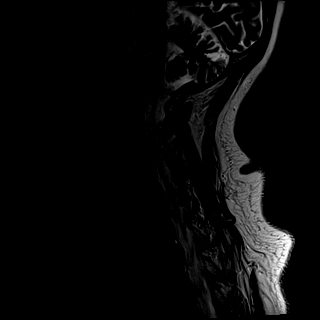
[im 10/13]
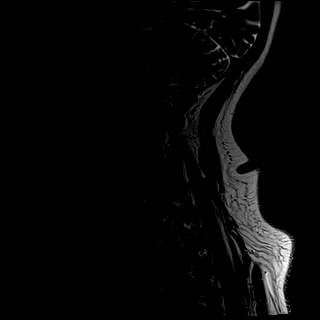
[im 13/13]
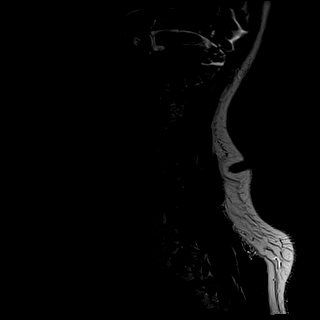

[Series 4: T1 · sagittal · 3.0mm · 0.41mm/px · 7 of 13 slices shown]
[im 1/13]
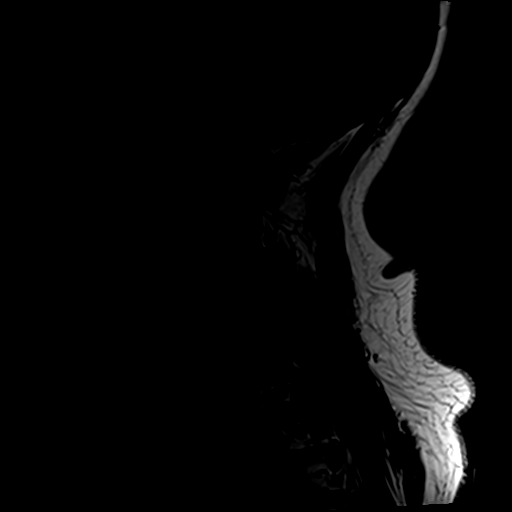
[im 3/13]
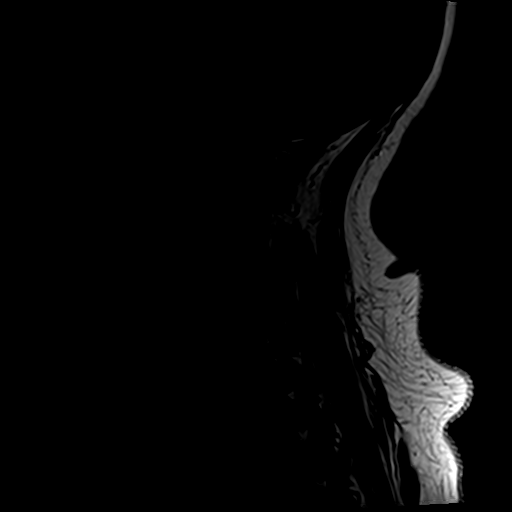
[im 5/13]
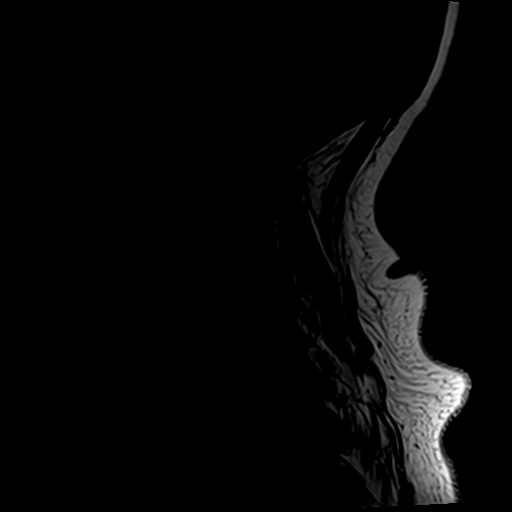
[im 7/13]
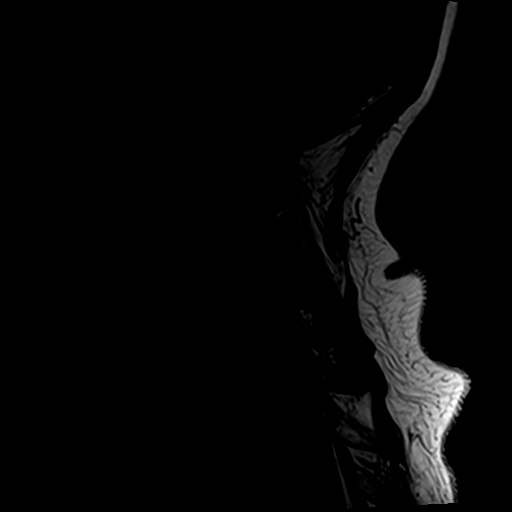
[im 9/13]
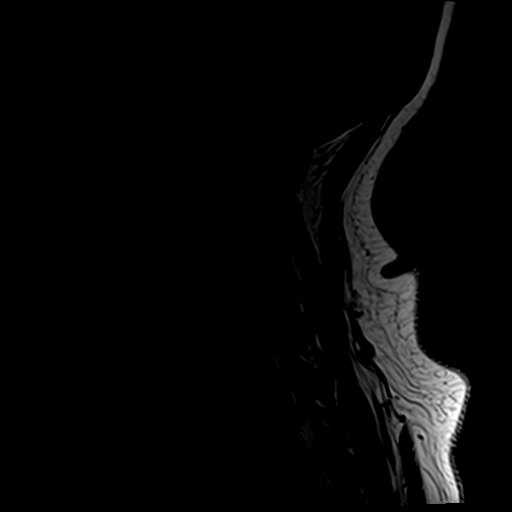
[im 11/13]
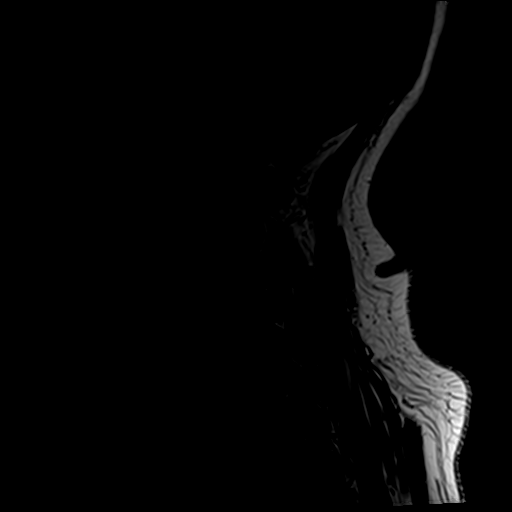
[im 13/13]
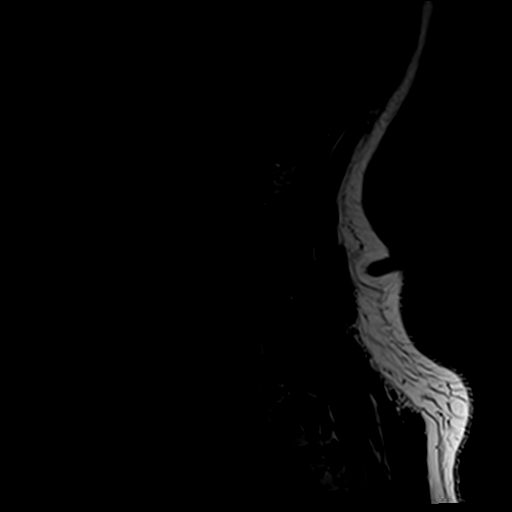

[Series 5: tir sag · sagittal · 3.0mm · 0.41mm/px · 7 of 13 slices shown]
[im 1/13]
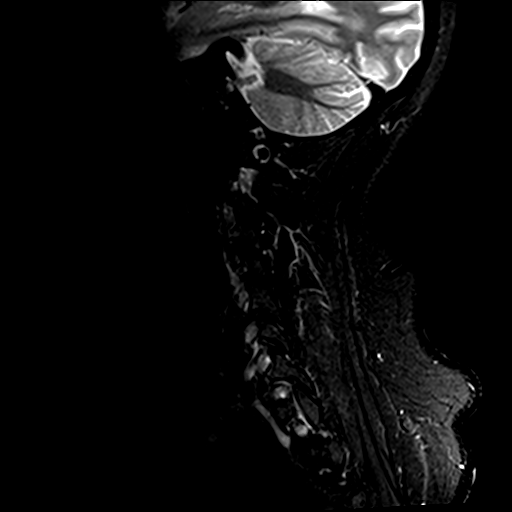
[im 3/13]
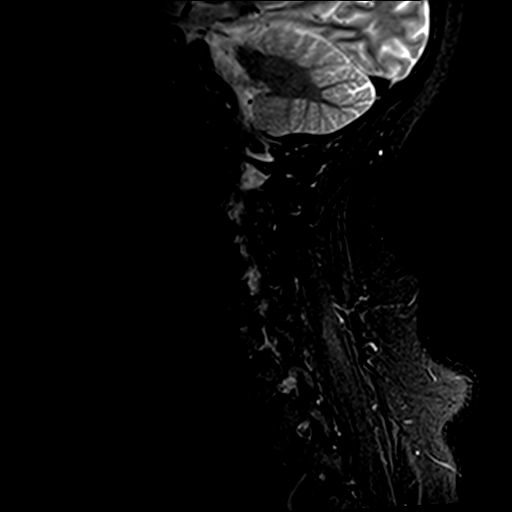
[im 5/13]
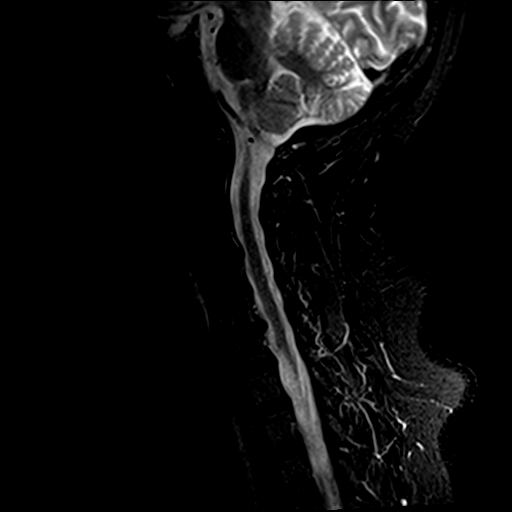
[im 7/13]
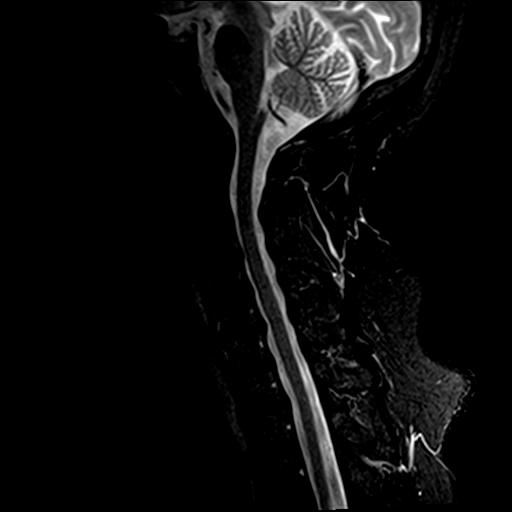
[im 9/13]
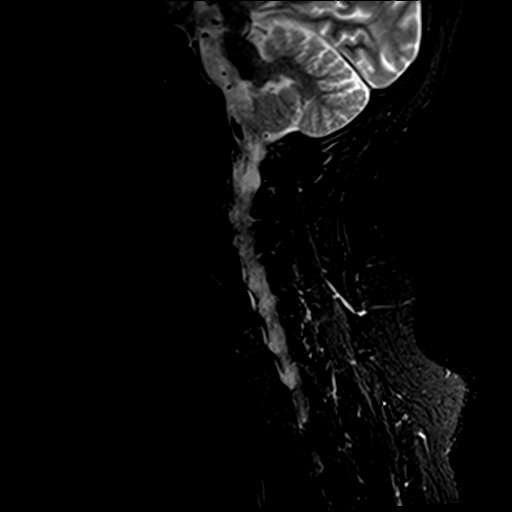
[im 11/13]
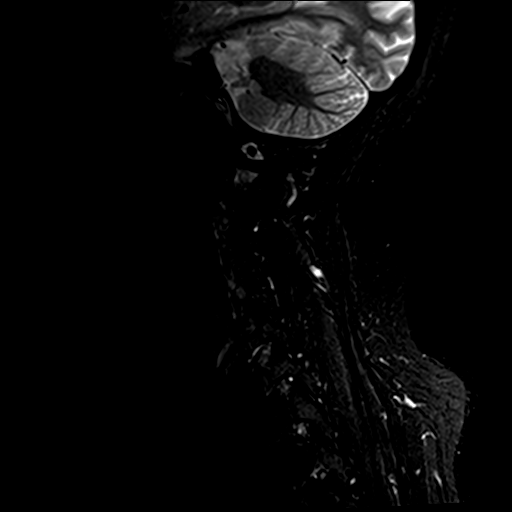
[im 13/13]
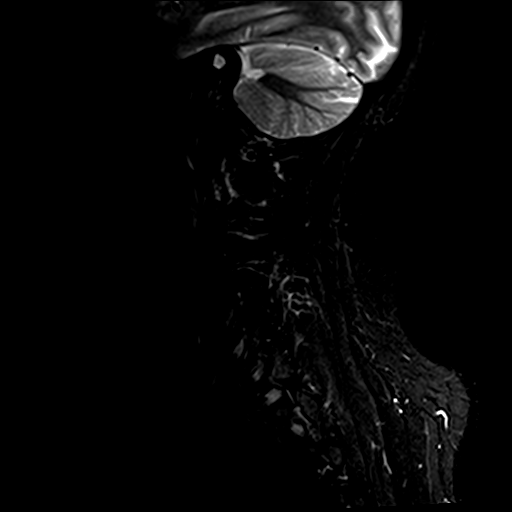

[Series 7: T2 · axial · 3.0mm · 0.70mm/px · z∈[-89,+1]mm · 8 of 28 slices shown (2 of 2)]
[im 1/28]
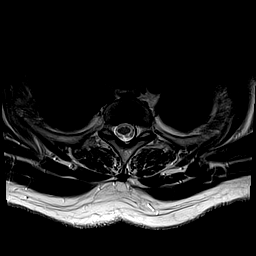
[im 5/28]
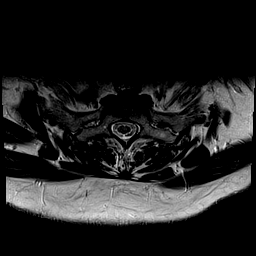
[im 9/28]
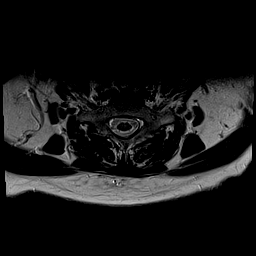
[im 13/28]
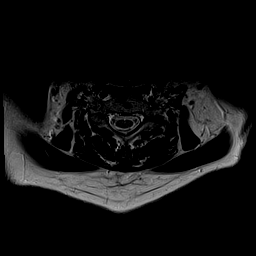
[im 15/28]
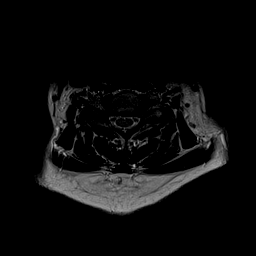
[im 19/28]
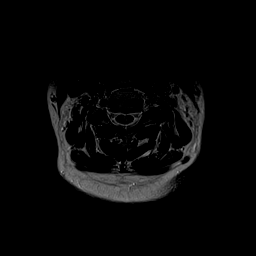
[im 23/28]
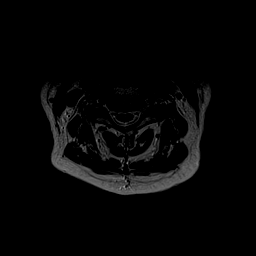
[im 28/28]
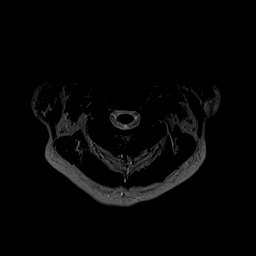

[28 of 48 positions shown; findings below may reference images not displayed]

FINDINGS: Alignment:  Normal cervical lordosis. No static listhesis.

Bones: Vertebral body heights are maintained. Bone marrow signal is
normal.

Spinal cord:  Cervical cord is normal in size and signal.

Posterior fossa: No focal abnormality.

Vessels:  Normal flow voids are present.

Paraspinal tissues:  No focal abnormality.

Disc levels:

Discs: Degenerative disc disease with mild disc height loss at C5-6.

C2-3: No significant disc bulge. No neural foraminal stenosis. No
central canal stenosis.

C3-4: Small central disc protrusion. No neural foraminal stenosis.
No central canal stenosis.

C4-5: Small central disc protrusion. No neural foraminal stenosis.
No central canal stenosis.

C5-6: Broad central disc protrusion. Bilateral uncovertebral
degenerative changes. Moderate right and severe left foraminal
stenosis. No central canal stenosis.

C6-7: No significant disc bulge. No neural foraminal stenosis. No
central canal stenosis.

C7-T1: Left foraminal disc osteophyte complex with mild left
foraminal stenosis. No central canal stenosis.
IMPRESSION: 1. At C5-6 there is a broad central disc protrusion. Bilateral
uncovertebral degenerative changes. Moderate right and severe left
foraminal stenosis.
2. At C7-T1 there is a left foraminal disc osteophyte complex with
mild left foraminal stenosis.

## 2016-12-13 DIAGNOSIS — H3589 Other specified retinal disorders: Secondary | ICD-10-CM | POA: Diagnosis not present

## 2016-12-13 DIAGNOSIS — H401133 Primary open-angle glaucoma, bilateral, severe stage: Secondary | ICD-10-CM | POA: Diagnosis not present

## 2016-12-13 DIAGNOSIS — Z961 Presence of intraocular lens: Secondary | ICD-10-CM | POA: Diagnosis not present

## 2016-12-15 DIAGNOSIS — M7751 Other enthesopathy of right foot: Secondary | ICD-10-CM | POA: Diagnosis not present

## 2016-12-15 DIAGNOSIS — M25571 Pain in right ankle and joints of right foot: Secondary | ICD-10-CM | POA: Diagnosis not present

## 2017-01-10 ENCOUNTER — Encounter: Payer: Self-pay | Admitting: Vascular Surgery

## 2017-01-10 DIAGNOSIS — I10 Essential (primary) hypertension: Secondary | ICD-10-CM | POA: Diagnosis not present

## 2017-01-10 DIAGNOSIS — K219 Gastro-esophageal reflux disease without esophagitis: Secondary | ICD-10-CM | POA: Diagnosis not present

## 2017-01-10 DIAGNOSIS — E119 Type 2 diabetes mellitus without complications: Secondary | ICD-10-CM | POA: Diagnosis not present

## 2017-01-10 DIAGNOSIS — J449 Chronic obstructive pulmonary disease, unspecified: Secondary | ICD-10-CM | POA: Diagnosis not present

## 2017-01-10 DIAGNOSIS — I739 Peripheral vascular disease, unspecified: Secondary | ICD-10-CM | POA: Diagnosis not present

## 2017-01-10 DIAGNOSIS — E7800 Pure hypercholesterolemia, unspecified: Secondary | ICD-10-CM | POA: Diagnosis not present

## 2017-01-10 DIAGNOSIS — N3281 Overactive bladder: Secondary | ICD-10-CM | POA: Diagnosis not present

## 2017-01-10 DIAGNOSIS — G4700 Insomnia, unspecified: Secondary | ICD-10-CM | POA: Diagnosis not present

## 2017-01-10 DIAGNOSIS — Z7984 Long term (current) use of oral hypoglycemic drugs: Secondary | ICD-10-CM | POA: Diagnosis not present

## 2017-01-11 ENCOUNTER — Ambulatory Visit: Payer: Medicare Other | Admitting: Vascular Surgery

## 2017-01-11 ENCOUNTER — Ambulatory Visit (HOSPITAL_COMMUNITY)
Admission: RE | Admit: 2017-01-11 | Discharge: 2017-01-11 | Disposition: A | Discharge status: Home / Self Care | Payer: Medicare Other | Source: Ambulatory Visit | Attending: Vascular Surgery | Admitting: Vascular Surgery

## 2017-01-11 DIAGNOSIS — I739 Peripheral vascular disease, unspecified: Secondary | ICD-10-CM

## 2017-01-17 ENCOUNTER — Ambulatory Visit (INDEPENDENT_AMBULATORY_CARE_PROVIDER_SITE_OTHER): Payer: Medicare Other | Admitting: Vascular Surgery

## 2017-01-17 VITALS — BP 126/80 | HR 62 | Temp 97.5°F | Resp 16 | Ht 63.0 in | Wt 149.0 lb

## 2017-01-17 DIAGNOSIS — I739 Peripheral vascular disease, unspecified: Secondary | ICD-10-CM

## 2017-01-17 NOTE — Progress Notes (Signed)
HPI: This is a 71 y.o. female who is s/p bilateral femoral to popliteal bypass grafts.   right was 2014, left was 2017, patient also had an angioplasty of her distal anastomosis on the left side in 2017. She does however continue to have numbness and tingling around her right knee and calf.  She is taking Neurontin 300mg  tid.  She does not have any non healing wounds or rest pain. She is scheduled to have a corn removed from her right foot by Dr. Barkley Bruns in the near future.     We have also been following bilateral external iliac artery stenoses. However, arteriogram August 2017 showed no really left external iliac artery stenosis and 50% right external iliac artery stenosis    She is on a statin for cholesterol management.  She is on oral agents for diabetes.  She takes a daily aspirin.  She is on a beta blocker for blood pressure management.     She quit smoking in 2014 but does still occasionally use an e cig.Marland Kitchen       Past Medical History:  Diagnosis Date  . Bronchitis    . Bulging disc      in neck  . Depression with anxiety    . Diabetes mellitus      Type II  . Fibromyalgia      pt. denies  . GERD (gastroesophageal reflux disease)    . Glaucoma    . History of bronchitis    . Hyperlipidemia    . Hypertension    . Overactive bladder    . Peripheral vascular disease ([place])    . Pneumonia    . Sleep apnea    . Tobacco abuse    . Urinary frequency             Past Surgical History:  Procedure Laterality Date  . ABDOMINAL AORTAGRAM N/A 08/15/2013    Procedure: ABDOMINAL Maxcine Ham;  Surgeon: Elam Dutch, MD;  Location: [facility] CATH LAB;  Service: Cardiovascular;  Laterality: N/A;  . ABDOMINAL AORTAGRAM N/A 06/19/2014    Procedure: ABDOMINAL Maxcine Ham;  Surgeon: Elam Dutch, MD;  Location: [facility] CATH LAB;  Service: Cardiovascular;  Laterality: N/A;  . Aortogram w/ PTA   05/14/08,  11-04-10    Bilateral aortogram w/ bilateral  SFA PTA  stenting   . BREAST SURGERY Right       boil removal  . BUNIONECTOMY        L foot in the 1980s  . COLONOSCOPY        2014  . DILATION AND CURETTAGE OF UTERUS        X4  . Epidural shots in neck       . EYE SURGERY Bilateral 2016    cataract removal  . FEMORAL-POPLITEAL BYPASS GRAFT Left 10/21/2013    Procedure: LEFT FEMORAL-POPLITEAL ARTERY BYPASS WITH SAPHENOUS VEIN GRAFT , POPLITEAL ENDARTERECTOMY ,INTRAOPERATIVE ARTERIOGRAM, vein patch angioplasty to popliteal artery;  Surgeon: Elam Dutch, MD;  Location: [facility] OR;  Service: Vascular;  Laterality: Left;  . FEMORAL-POPLITEAL BYPASS GRAFT Right 02/08/2016    Procedure: Right FEMORAL- to Above Knee POPLITEAL ARTERY Bypass Graft with reversed saphenous vein and Common Femoral Endarterectomy  with profundoplasty;  Surgeon: Elam Dutch, MD;  Location: [place];  Service: Vascular;  Laterality: Right;  . GROIN DEBRIDEMENT Left 10/28/2013    Procedure: left inner thigh DEBRIDEMENT;  Surgeon: Elam Dutch, MD;  Location: [place];  Service:  Vascular;  Laterality: Left;  . HAND SURGERY Left 2010  . LOWER EXTREMITY ANGIOGRAM Bilateral 12/24/2015    Procedure: Lower Extremity Angiogram;  Surgeon: Elam Dutch, MD;  Location: [place] CV LAB;  Service: Cardiovascular;  Laterality: Bilateral;  . PERIPHERAL VASCULAR CATHETERIZATION N/A 12/24/2015    Procedure: Abdominal Aortogram;  Surgeon: Elam Dutch, MD;  Location: [place] CV LAB;  Service: Cardiovascular;  Laterality: N/A;  . PERIPHERAL VASCULAR CATHETERIZATION Left 12/24/2015    Procedure: Peripheral Vascular Balloon Angioplasty;  Surgeon: Elam Dutch, MD;  Location: [place] CV LAB;  Service: Cardiovascular;  Laterality: Left;  drug coated balloon  . PERIPHERAL VASCULAR CATHETERIZATION N/A 06/30/2016    Procedure: Abdominal Aortogram;  Surgeon: Elam Dutch, MD;  Location: [place] CV LAB;  Service: Cardiovascular;  Laterality: N/A;  . PERIPHERAL VASCULAR CATHETERIZATION Bilateral 06/30/2016    Procedure:  Lower Extremity Angiography;  Surgeon: Elam Dutch, MD;  Location: [place] CV LAB;  Service: Cardiovascular;  Laterality: Bilateral;  . TONSILLECTOMY          Allergies  Allergen Reactions  . Latex Rash  . Adhesive [Tape] Other (See Comments)      Pt states that tape and electrodes leave red "scars" on her skin and her skin is very sensitive.            Current Outpatient Prescriptions  Medication Sig Dispense Refill  . aspirin EC 81 MG tablet Take 81 mg by mouth daily.      Marland Kitchen bismuth subsalicylate (PEPTO BISMOL) 262 MG/15ML suspension Take 30 mLs by mouth every 6 (six) hours as needed for indigestion or diarrhea or loose stools. Reported on 11/04/2015      . brimonidine (ALPHAGAN) 0.2 % ophthalmic solution        . CARAFATE 1 GM/10ML suspension Take 10 mLs by mouth 4 (four) times daily as needed. (ACID REFLUX)   3  . Cyanocobalamin (VITAMIN B-12) 5000 MCG TBDP Take by mouth.      . gabapentin (NEURONTIN) 300 MG capsule Take 1 capsule (300 mg total) by mouth 3 (three) times daily. 90 capsule 6  . glimepiride (AMARYL) 4 MG tablet Take 4 mg by mouth daily with breakfast.      . latanoprost (XALATAN) 0.005 % ophthalmic solution Place 1 drop into both eyes at bedtime.      . lovastatin (MEVACOR) 20 MG tablet Take 20 mg by mouth every morning.       . metFORMIN (GLUCOPHAGE) 1000 MG tablet Take 1,000 mg by mouth 2 (two) times daily with a meal.      . metoprolol succinate (TOPROL-XL) 50 MG 24 hr tablet        . pantoprazole (PROTONIX) 40 MG tablet Take 40 mg by mouth daily.      Marland Kitchen PROAIR HFA 108 (90 BASE) MCG/ACT inhaler Inhale 2 puffs into the lungs every 4 (four) hours as needed for wheezing or shortness of breath. Reported on 11/04/2015      . temazepam (RESTORIL) 30 MG capsule Take 30 mg by mouth at bedtime.        No current facility-administered medications for this visit.             Family History  Problem Relation Age of Onset  . Heart disease Father    . Heart attack  Father        MI at age 25  . Hypertension Mother    . Alzheimer's disease Mother    .  Hypertension Sister    . Diabetes Brother    . Hyperlipidemia Brother    . Hypertension Brother    . Heart disease Brother    . Heart attack Brother        Social History         Social History  . Marital status: Single      Spouse name: N/A  . Number of children: 1  . Years of education: N/A        Occupational History  . Retired Sales promotion account executive          Social History Main Topics  . Smoking status: Former Smoker      Packs/day: 0.10      Types: Cigarettes      Quit date: 10/2014  . Smokeless tobacco: Never Used        Comment: pt states that she is using the E cig only   . Alcohol use 0.0 - 0.6 oz/week        Comment: Occasional  . Drug use: No  . Sexual activity: Not on file        Other Topics Concern  . Not on file       Social History Narrative    Drinks about 2 cups of coffee a day, and some tea         REVIEW OF SYSTEMS:    [X]  denotes positive finding, [ ]  denotes negative finding Cardiac   Comments:  Chest pain or chest pressure:      Shortness of breath upon exertion:      Short of breath when lying flat:      Irregular heart rhythm:             Vascular      Pain in calf, thigh, or hip brought on by ambulation:      Pain in feet at night that wakes you up from your sleep:       Blood clot in your veins:      Leg swelling:              Pulmonary      Oxygen at home:      Productive cough:       Wheezing:              Neurologic      Sudden weakness in arms or legs:       numbness in arms or legs:  x Right leg---See HPI  Sudden onset of difficulty speaking or slurred speech:      Temporary loss of vision in one eye:       Problems with dizziness:              Gastrointestinal      Blood in stool:       Vomited blood:              Genitourinary      Burning when urinating:       Blood in urine:             Psychiatric      Major depression:               Hematologic      Bleeding problems:      Problems with blood clotting too easily:             Skin      Rashes or ulcers:  Constitutional      Fever or chills:          PHYSICAL EXAMINATION:   Vitals:   01/17/17 1106  BP: 126/80  Pulse: 62  Resp: 16  Temp: 97.5 F (36.4 C)  TempSrc: Oral  SpO2: 100%  Weight: 149 lb (67.6 kg)  Height: 5\' 3"  (1.6 m)   Neck: No carotid bruits  Pulmonary: normal non-labored breathing , without Rales, rhonchi,  wheezing Cardiac: regular HR Skin: without rashes Vascular Exam/Pulses: She has bilateral 1+ dorsalis pedis pulses  Extremities: without ischemic changes, without Gangrene , without cellulitis; without open wounds;  Musculoskeletal: no muscle wasting or atrophy       Neurologic: A&O X 3;  No focal weakness or paresthesias are detected Psychiatric:  The pt has Normal affect.     Non-Invasive Vascular Imaging:   ABI's 10/05/16: Right:  0.80 Left:  1.08   2/18  bilateral ABIs were 0.72 on the right 0.89 on the left duplex showed patent bypasses bilaterally   Pt meds includes: Statin:  Yes.   Beta Blocker:  Yes.   Aspirin:  Yes.   ACEI:  No. ARB:  No. Other Antiplatelet/Anticoagulant:  No.      ASSESSMENT/PLAN:: 71 y.o. female with hx of bilateral femoral to popliteal bypass grafts She continues to not have claudication. Patient does have a known mild to moderate right external iliac artery stenosis above her femoropopliteal bypass graft. Currently she is asymptomatic. She has symmetric femoral pulses. Her ABIs have declined slightly. However, on her arteriogram there is not much room to angioplasty the external iliac artery above the bypass and I believe this would be fairly high risk. She'll return in 3 months time for repeat duplex and ABIs.    f/u in 3 months with repeat ABI's and bilateral arterial duplex.  She will call sooner if she develops any claudication or non healing wounds.     She'll return sooner  she develops recurrent claudication symptoms.   Ruta Hinds, MD [facility] [place] Office: [phone] Pager: [phone]

## 2017-01-23 NOTE — Addendum Note (Signed)
Addended by: Lianne Cure A on: 01/23/2017 04:19 PM   Modules accepted: Orders

## 2017-01-31 DIAGNOSIS — M25571 Pain in right ankle and joints of right foot: Secondary | ICD-10-CM | POA: Diagnosis not present

## 2017-01-31 DIAGNOSIS — M7751 Other enthesopathy of right foot: Secondary | ICD-10-CM | POA: Diagnosis not present

## 2017-02-20 DIAGNOSIS — I739 Peripheral vascular disease, unspecified: Secondary | ICD-10-CM | POA: Diagnosis not present

## 2017-02-20 DIAGNOSIS — J449 Chronic obstructive pulmonary disease, unspecified: Secondary | ICD-10-CM | POA: Diagnosis not present

## 2017-02-20 DIAGNOSIS — I10 Essential (primary) hypertension: Secondary | ICD-10-CM | POA: Diagnosis not present

## 2017-02-20 DIAGNOSIS — E1165 Type 2 diabetes mellitus with hyperglycemia: Secondary | ICD-10-CM | POA: Diagnosis not present

## 2017-02-20 DIAGNOSIS — E7800 Pure hypercholesterolemia, unspecified: Secondary | ICD-10-CM | POA: Diagnosis not present

## 2017-02-20 DIAGNOSIS — Z7984 Long term (current) use of oral hypoglycemic drugs: Secondary | ICD-10-CM | POA: Diagnosis not present

## 2017-02-20 DIAGNOSIS — F439 Reaction to severe stress, unspecified: Secondary | ICD-10-CM | POA: Diagnosis not present

## 2017-02-27 ENCOUNTER — Ambulatory Visit (INDEPENDENT_AMBULATORY_CARE_PROVIDER_SITE_OTHER): Payer: Medicare Other | Admitting: Orthopaedic Surgery

## 2017-03-26 DIAGNOSIS — H401133 Primary open-angle glaucoma, bilateral, severe stage: Secondary | ICD-10-CM | POA: Diagnosis not present

## 2017-03-26 DIAGNOSIS — H01009 Unspecified blepharitis unspecified eye, unspecified eyelid: Secondary | ICD-10-CM | POA: Diagnosis not present

## 2017-03-26 DIAGNOSIS — H00022 Hordeolum internum right lower eyelid: Secondary | ICD-10-CM | POA: Diagnosis not present

## 2017-04-04 ENCOUNTER — Ambulatory Visit (INDEPENDENT_AMBULATORY_CARE_PROVIDER_SITE_OTHER): Payer: Medicare Other | Admitting: Orthopaedic Surgery

## 2017-04-04 ENCOUNTER — Encounter (INDEPENDENT_AMBULATORY_CARE_PROVIDER_SITE_OTHER): Payer: Self-pay | Admitting: Orthopaedic Surgery

## 2017-04-04 VITALS — BP 129/86 | HR 55 | Ht 63.0 in | Wt 151.0 lb

## 2017-04-04 DIAGNOSIS — M75111 Incomplete rotator cuff tear or rupture of right shoulder, not specified as traumatic: Secondary | ICD-10-CM | POA: Insufficient documentation

## 2017-04-04 MED ORDER — LIDOCAINE HCL 1 % IJ SOLN
1.0000 mL | INTRAMUSCULAR | Status: AC | PRN
  Administered 2017-04-04: 1 mL

## 2017-04-04 MED ORDER — BUPIVACAINE HCL 0.25 % IJ SOLN
4.0000 mL | INTRAMUSCULAR | Status: AC | PRN
  Administered 2017-04-04: 4 mL via INTRA_ARTICULAR

## 2017-04-04 MED ORDER — METHYLPREDNISOLONE ACETATE 40 MG/ML IJ SUSP
40.0000 mg | INTRAMUSCULAR | Status: AC | PRN
  Administered 2017-04-04: 40 mg via INTRA_ARTICULAR

## 2017-04-04 NOTE — Progress Notes (Signed)
Office Visit Note   Patient: Jill Higgins           Date of Birth: 27-Dec-1945           MRN: [id number] Visit Date: 04/04/2017              Requested by: Carol Ada, [place], [place] [address] PCP: Carol Ada, MD   Assessment & Plan: Visit Diagnoses:  1. Partial tear of right rotator cuff     Plan: Subacromial injection performed which she tolerated well. She'll call if she has increasing symptoms with the right shoulder. We discussed activity modification and avoid excessive loading.  Follow-Up Instructions: Return if symptoms worsen or fail to improve.   Orders:  No orders of the defined types were placed in this encounter.  No orders of the defined types were placed in this encounter.     Procedures: Large Joint Inj Date/Time: 04/04/2017 10:01 AM Performed by: Marybelle Killings Authorized by: Rodell Perna C   Consent Given by:  Patient Indications:  Pain Location:  Shoulder Site:  R subacromial bursa Needle Size:  22 G Needle Length:  1.5 inches Approach:  Lateral Ultrasound Guidance: No   Fluoroscopic Guidance: No   Arthrogram: No   Medications:  1 mL lidocaine 1 %; 40 mg methylPREDNISolone acetate 40 MG/ML; 4 mL bupivacaine 0.25 % Aspiration Attempted: No   Patient tolerance:  Patient tolerated the procedure well with no immediate complications     Clinical Data: No additional findings.   Subjective: Chief Complaint  Patient presents with  . Right Shoulder - Pain    HPI patient returns for recurrent problems with the right shoulder. Previous injection one year ago gave her some relief. She recently buried her mother who is 4 years old. She was helping with her mother's care and had to do some pulling and lifting. Right shoulder has given her increased problems. Previous MRI showed partial rotator cuff tear involving the supraspinatus. Her senses of been worse recently and she requested repeat injection. She states  she's been trying to put off surgery until the fall. No associated neck pain and numbness or tingling in her hand no fever or chills. She has pain with such reaching overhead activities. She still been able to dress fix her hair.  Review of Systems plus her previous arterial bypass by vascular service. Positive for sleep apnea, right shoulder partial rotator cuff tear, diabetes, glaucoma. Otherwise negative as it relates to her history of present illness.   Objective: Vital Signs: BP 129/86   Pulse (!) 55   Ht 5\' 3"  (1.6 m)   Wt 151 lb (68.5 kg)   BMI 26.75 kg/m   Physical Exam  Constitutional: She is oriented to person, place, and time. She appears well-developed.  HENT:  Head: Normocephalic.  Right Ear: External ear normal.  Left Ear: External ear normal.  Eyes: Pupils are equal, round, and reactive to light.  Neck: No tracheal deviation present. No thyromegaly present.  Cardiovascular: Normal rate.   Pulmonary/Chest: Effort normal.  Abdominal: Soft.  Musculoskeletal:  He says mild discomfort cervical compression rotation is 70% right and left without radicular symptoms no brachioplexus tenderness. Positive impingement right shoulder mildly positive left shoulder elbow is reach full extension. Sensation or hand grip wrist extension and flexion is normal. Normal heel toe gait. Wrist pulses are normal. Capillary refill no wrist or finger joint deformity.  Neurological: She is alert and oriented to person,  place, and time.  Skin: Skin is warm and dry.  Psychiatric: She has a normal mood and affect. Her behavior is normal.    Ortho Exam positive impingement right shoulder negative [place]. Long of the biceps is minimally tender. No subluxation the shoulders before meals joint is normal no supraclavicular lymphadenopathy.  Specialty Comments:  No specialty comments available.  Imaging: No results found.   PMFS History: Patient Active Problem List   Diagnosis Date Noted  .  Partial tear of right rotator cuff 04/04/2017  . Aftercare following surgery of the circulatory system 02/23/2016  . S/P femoropopliteal bypass surgery 02/23/2016  . PAD (peripheral artery disease) ([place]) 02/08/2016  . Leg edema, left 12/18/2013  . Aftercare following surgery of the circulatory system, [place] 11/19/2013  . Ischemic leg 10/21/2013  . Peripheral vascular disease, unspecified ([place]) 09/18/2013  . Preop cardiovascular exam 09/08/2013  . Hypertension   . Hyperlipidemia   . Peripheral vascular disease ([place])   . Fibromyalgia   . GERD (gastroesophageal reflux disease)   . Depression with anxiety   . Pain in limb 04/03/2013  . Atherosclerosis of native arteries of the extremities with intermittent claudication 08/01/2012  . Claudication ([place]) 12/28/2011   Past Medical History:  Diagnosis Date  . Bronchitis   . Bulging disc    in neck  . Depression with anxiety   . Diabetes mellitus    Type II  . Fibromyalgia    pt. denies  . GERD (gastroesophageal reflux disease)   . Glaucoma   . History of bronchitis   . Hyperlipidemia   . Hypertension   . Overactive bladder   . Peripheral vascular disease ([place])   . Pneumonia   . Sleep apnea   . Tobacco abuse   . Urinary frequency     Family History  Problem Relation Age of Onset  . Heart disease Father   . Heart attack Father        MI at age 32  . Hypertension Mother   . Alzheimer's disease Mother   . Hypertension Sister   . Diabetes Brother   . Hyperlipidemia Brother   . Hypertension Brother   . Heart disease Brother   . Heart attack Brother     Past Surgical History:  Procedure Laterality Date  . ABDOMINAL AORTAGRAM N/A 08/15/2013   Procedure: ABDOMINAL Maxcine Ham;  Surgeon: Elam Dutch, MD;  Location: [facility] CATH LAB;  Service: Cardiovascular;  Laterality: N/A;  . ABDOMINAL AORTAGRAM N/A 06/19/2014   Procedure: ABDOMINAL Maxcine Ham;  Surgeon: Elam Dutch, MD;  Location: [facility] CATH LAB;  Service: Cardiovascular;   Laterality: N/A;  . Aortogram w/ PTA  05/14/08,  11-04-10   Bilateral aortogram w/ bilateral  SFA PTA  stenting   . BREAST SURGERY Right    boil removal  . BUNIONECTOMY     L foot in the 1980s  . COLONOSCOPY     2014  . DILATION AND CURETTAGE OF UTERUS     X4  . Epidural shots in neck     . EYE SURGERY Bilateral 2016   cataract removal  . FEMORAL-POPLITEAL BYPASS GRAFT Left 10/21/2013   Procedure: LEFT FEMORAL-POPLITEAL ARTERY BYPASS WITH SAPHENOUS VEIN GRAFT , POPLITEAL ENDARTERECTOMY ,INTRAOPERATIVE ARTERIOGRAM, vein patch angioplasty to popliteal artery;  Surgeon: Elam Dutch, MD;  Location: [facility] OR;  Service: Vascular;  Laterality: Left;  . FEMORAL-POPLITEAL BYPASS GRAFT Right 02/08/2016   Procedure: Right FEMORAL- to Above Knee POPLITEAL ARTERY Bypass Graft with reversed saphenous vein and  Common Femoral Endarterectomy  with profundoplasty;  Surgeon: Elam Dutch, MD;  Location: [place];  Service: Vascular;  Laterality: Right;  . GROIN DEBRIDEMENT Left 10/28/2013   Procedure: left inner thigh DEBRIDEMENT;  Surgeon: Elam Dutch, MD;  Location: [place];  Service: Vascular;  Laterality: Left;  . HAND SURGERY Left 2010  . LOWER EXTREMITY ANGIOGRAM Bilateral 12/24/2015   Procedure: Lower Extremity Angiogram;  Surgeon: Elam Dutch, MD;  Location: [place] CV LAB;  Service: Cardiovascular;  Laterality: Bilateral;  . PERIPHERAL VASCULAR CATHETERIZATION N/A 12/24/2015   Procedure: Abdominal Aortogram;  Surgeon: Elam Dutch, MD;  Location: [place] CV LAB;  Service: Cardiovascular;  Laterality: N/A;  . PERIPHERAL VASCULAR CATHETERIZATION Left 12/24/2015   Procedure: Peripheral Vascular Balloon Angioplasty;  Surgeon: Elam Dutch, MD;  Location: [place] CV LAB;  Service: Cardiovascular;  Laterality: Left;  drug coated balloon  . PERIPHERAL VASCULAR CATHETERIZATION N/A 06/30/2016   Procedure: Abdominal Aortogram;  Surgeon: Elam Dutch, MD;  Location: [place] CV  LAB;  Service: Cardiovascular;  Laterality: N/A;  . PERIPHERAL VASCULAR CATHETERIZATION Bilateral 06/30/2016   Procedure: Lower Extremity Angiography;  Surgeon: Elam Dutch, MD;  Location: [place] CV LAB;  Service: Cardiovascular;  Laterality: Bilateral;  . TONSILLECTOMY     Social History   Occupational History  . Retired Sales promotion account executive   Social History Main Topics  . Smoking status: Former Smoker    Packs/day: 0.10    Types: Cigarettes    Quit date: 10/2014  . Smokeless tobacco: Never Used     Comment: pt states that she is using the E cig only   . Alcohol use 0.0 - 0.6 oz/week     Comment: Occasional  . Drug use: No  . Sexual activity: Not on file

## 2017-04-05 DIAGNOSIS — H00022 Hordeolum internum right lower eyelid: Secondary | ICD-10-CM | POA: Diagnosis not present

## 2017-04-05 DIAGNOSIS — H401133 Primary open-angle glaucoma, bilateral, severe stage: Secondary | ICD-10-CM | POA: Diagnosis not present

## 2017-04-05 DIAGNOSIS — H01009 Unspecified blepharitis unspecified eye, unspecified eyelid: Secondary | ICD-10-CM | POA: Diagnosis not present

## 2017-04-05 DIAGNOSIS — H1045 Other chronic allergic conjunctivitis: Secondary | ICD-10-CM | POA: Diagnosis not present

## 2017-04-17 ENCOUNTER — Other Ambulatory Visit: Payer: Self-pay | Admitting: Family Medicine

## 2017-04-17 DIAGNOSIS — Z1231 Encounter for screening mammogram for malignant neoplasm of breast: Secondary | ICD-10-CM

## 2017-04-25 ENCOUNTER — Ambulatory Visit: Payer: 59

## 2017-04-26 ENCOUNTER — Ambulatory Visit
Admission: RE | Admit: 2017-04-26 | Discharge: 2017-04-26 | Disposition: A | Discharge status: Home / Self Care | Payer: Medicare Other | Source: Ambulatory Visit | Attending: Family Medicine | Admitting: Family Medicine

## 2017-04-26 DIAGNOSIS — Z1231 Encounter for screening mammogram for malignant neoplasm of breast: Secondary | ICD-10-CM

## 2017-05-03 ENCOUNTER — Other Ambulatory Visit: Payer: Self-pay

## 2017-05-03 MED ORDER — GABAPENTIN 300 MG PO CAPS: 300.0000 mg | ORAL_CAPSULE | Freq: Three times a day (TID) | ORAL | 6 refills | Status: DC

## 2017-05-09 DIAGNOSIS — M7751 Other enthesopathy of right foot: Secondary | ICD-10-CM | POA: Diagnosis not present

## 2017-05-09 DIAGNOSIS — M25571 Pain in right ankle and joints of right foot: Secondary | ICD-10-CM | POA: Diagnosis not present

## 2017-05-31 DIAGNOSIS — L219 Seborrheic dermatitis, unspecified: Secondary | ICD-10-CM | POA: Diagnosis not present

## 2017-05-31 DIAGNOSIS — H401133 Primary open-angle glaucoma, bilateral, severe stage: Secondary | ICD-10-CM | POA: Diagnosis not present

## 2017-06-07 DIAGNOSIS — J449 Chronic obstructive pulmonary disease, unspecified: Secondary | ICD-10-CM | POA: Diagnosis not present

## 2017-06-07 DIAGNOSIS — Z1389 Encounter for screening for other disorder: Secondary | ICD-10-CM | POA: Diagnosis not present

## 2017-06-07 DIAGNOSIS — E7800 Pure hypercholesterolemia, unspecified: Secondary | ICD-10-CM | POA: Diagnosis not present

## 2017-06-07 DIAGNOSIS — K219 Gastro-esophageal reflux disease without esophagitis: Secondary | ICD-10-CM | POA: Diagnosis not present

## 2017-06-07 DIAGNOSIS — Z1159 Encounter for screening for other viral diseases: Secondary | ICD-10-CM | POA: Diagnosis not present

## 2017-06-07 DIAGNOSIS — E1165 Type 2 diabetes mellitus with hyperglycemia: Secondary | ICD-10-CM | POA: Diagnosis not present

## 2017-06-07 DIAGNOSIS — Z0000 Encounter for general adult medical examination without abnormal findings: Secondary | ICD-10-CM | POA: Diagnosis not present

## 2017-06-07 DIAGNOSIS — I10 Essential (primary) hypertension: Secondary | ICD-10-CM | POA: Diagnosis not present

## 2017-06-07 DIAGNOSIS — I739 Peripheral vascular disease, unspecified: Secondary | ICD-10-CM | POA: Diagnosis not present

## 2017-06-07 DIAGNOSIS — N3281 Overactive bladder: Secondary | ICD-10-CM | POA: Diagnosis not present

## 2017-06-14 DIAGNOSIS — L219 Seborrheic dermatitis, unspecified: Secondary | ICD-10-CM | POA: Diagnosis not present

## 2017-06-14 DIAGNOSIS — H401133 Primary open-angle glaucoma, bilateral, severe stage: Secondary | ICD-10-CM | POA: Diagnosis not present

## 2017-07-31 ENCOUNTER — Encounter: Payer: Self-pay | Admitting: Vascular Surgery

## 2017-08-02 ENCOUNTER — Ambulatory Visit (INDEPENDENT_AMBULATORY_CARE_PROVIDER_SITE_OTHER)
Admission: RE | Admit: 2017-08-02 | Discharge: 2017-08-02 | Disposition: A | Discharge status: Home / Self Care | Payer: Medicare Other | Source: Ambulatory Visit | Attending: Vascular Surgery | Admitting: Vascular Surgery

## 2017-08-02 ENCOUNTER — Other Ambulatory Visit: Payer: Self-pay

## 2017-08-02 ENCOUNTER — Ambulatory Visit (HOSPITAL_COMMUNITY)
Admission: RE | Admit: 2017-08-02 | Discharge: 2017-08-02 | Disposition: A | Discharge status: Home / Self Care | Payer: Medicare Other | Source: Ambulatory Visit | Attending: Vascular Surgery | Admitting: Vascular Surgery

## 2017-08-02 ENCOUNTER — Ambulatory Visit (INDEPENDENT_AMBULATORY_CARE_PROVIDER_SITE_OTHER): Payer: Medicare Other | Admitting: Vascular Surgery

## 2017-08-02 ENCOUNTER — Encounter: Payer: Self-pay | Admitting: Vascular Surgery

## 2017-08-02 VITALS — BP 153/96 | HR 62 | Temp 97.5°F | Ht 63.0 in | Wt 155.0 lb

## 2017-08-02 DIAGNOSIS — I739 Peripheral vascular disease, unspecified: Secondary | ICD-10-CM

## 2017-08-02 NOTE — Progress Notes (Signed)
Patient is a 71 year old female who is status post bilateral femoral to popliteal bypass graft. Her right side was in 2014. Left was in 2017. She also had an angioplasty of the distal anastomosis in the left side in 2017. She had been doing well except for some numbness and tingling around her incisions. She states she doesn't think the Neurontin is working as well. She also noticed recently that she feels that her right claudication symptoms are returning. She has a known distal right external iliac artery stenosis which I previously did not angioplasty due to its location adjacent to the femoral head. She has no left leg symptoms. She is on a statin. She is also on aspirin. She quit smoking cigarettes in 2014 but is still using electronic cigarette. I discussed with her again today that this is a nicotine delivery system.  Current Outpatient Prescriptions on File Prior to Visit  Medication Sig Dispense Refill  . ADVAIR DISKUS 250-50 MCG/DOSE AEPB     . amLODipine (NORVASC) 5 MG tablet     . aspirin EC 81 MG tablet Take 81 mg by mouth daily.    Marland Kitchen bismuth subsalicylate (PEPTO BISMOL) 262 MG/15ML suspension Take 30 mLs by mouth every 6 (six) hours as needed for indigestion or diarrhea or loose stools. Reported on 11/04/2015    . brimonidine (ALPHAGAN) 0.2 % ophthalmic solution     . CARAFATE 1 GM/10ML suspension Take 10 mLs by mouth 4 (four) times daily as needed. (ACID REFLUX)  3  . Cyanocobalamin (VITAMIN B-12) 5000 MCG TBDP Take by mouth.    . gabapentin (NEURONTIN) 300 MG capsule Take 1 capsule (300 mg total) by mouth 3 (three) times daily. 90 capsule 6  . latanoprost (XALATAN) 0.005 % ophthalmic solution Place 1 drop into both eyes at bedtime.    . lovastatin (MEVACOR) 20 MG tablet Take 20 mg by mouth every morning.     . metFORMIN (GLUCOPHAGE) 1000 MG tablet Take 1,000 mg by mouth 2 (two) times daily with a meal.    . metoprolol succinate (TOPROL-XL) 100 MG 24 hr tablet     . pantoprazole  (PROTONIX) 40 MG tablet Take 40 mg by mouth daily.    Marland Kitchen PROAIR HFA 108 (90 BASE) MCG/ACT inhaler Inhale 2 puffs into the lungs every 4 (four) hours as needed for wheezing or shortness of breath. Reported on 11/04/2015    . SITagliptin Phosphate (JANUVIA PO) Take by mouth.    . temazepam (RESTORIL) 30 MG capsule Take 30 mg by mouth at bedtime.     No current facility-administered medications on file prior to visit.     Review of systems: She denies shortness of breath. She denies chest pain.  Past Surgical History:  Procedure Laterality Date  . ABDOMINAL AORTAGRAM N/A 08/15/2013   Procedure: ABDOMINAL Maxcine Ham;  Surgeon: Elam Dutch, MD;  Location: [facility] CATH LAB;  Service: Cardiovascular;  Laterality: N/A;  . ABDOMINAL AORTAGRAM N/A 06/19/2014   Procedure: ABDOMINAL Maxcine Ham;  Surgeon: Elam Dutch, MD;  Location: [facility] CATH LAB;  Service: Cardiovascular;  Laterality: N/A;  . Aortogram w/ PTA  05/14/08,  11-04-10   Bilateral aortogram w/ bilateral  SFA PTA  stenting   . BREAST SURGERY Right    boil removal  . BUNIONECTOMY     L foot in the 1980s  . COLONOSCOPY     2014  . DILATION AND CURETTAGE OF UTERUS     X4  . Epidural shots in neck     .  EYE SURGERY Bilateral 2016   cataract removal  . FEMORAL-POPLITEAL BYPASS GRAFT Left 10/21/2013   Procedure: LEFT FEMORAL-POPLITEAL ARTERY BYPASS WITH SAPHENOUS VEIN GRAFT , POPLITEAL ENDARTERECTOMY ,INTRAOPERATIVE ARTERIOGRAM, vein patch angioplasty to popliteal artery;  Surgeon: Elam Dutch, MD;  Location: [facility] OR;  Service: Vascular;  Laterality: Left;  . FEMORAL-POPLITEAL BYPASS GRAFT Right 02/08/2016   Procedure: Right FEMORAL- to Above Knee POPLITEAL ARTERY Bypass Graft with reversed saphenous vein and Common Femoral Endarterectomy  with profundoplasty;  Surgeon: Elam Dutch, MD;  Location: [place];  Service: Vascular;  Laterality: Right;  . GROIN DEBRIDEMENT Left 10/28/2013   Procedure: left inner thigh DEBRIDEMENT;  Surgeon:  Elam Dutch, MD;  Location: [place];  Service: Vascular;  Laterality: Left;  . HAND SURGERY Left 2010  . LOWER EXTREMITY ANGIOGRAM Bilateral 12/24/2015   Procedure: Lower Extremity Angiogram;  Surgeon: Elam Dutch, MD;  Location: [place] CV LAB;  Service: Cardiovascular;  Laterality: Bilateral;  . PERIPHERAL VASCULAR CATHETERIZATION N/A 12/24/2015   Procedure: Abdominal Aortogram;  Surgeon: Elam Dutch, MD;  Location: [place] CV LAB;  Service: Cardiovascular;  Laterality: N/A;  . PERIPHERAL VASCULAR CATHETERIZATION Left 12/24/2015   Procedure: Peripheral Vascular Balloon Angioplasty;  Surgeon: Elam Dutch, MD;  Location: [place] CV LAB;  Service: Cardiovascular;  Laterality: Left;  drug coated balloon  . PERIPHERAL VASCULAR CATHETERIZATION N/A 06/30/2016   Procedure: Abdominal Aortogram;  Surgeon: Elam Dutch, MD;  Location: [place] CV LAB;  Service: Cardiovascular;  Laterality: N/A;  . PERIPHERAL VASCULAR CATHETERIZATION Bilateral 06/30/2016   Procedure: Lower Extremity Angiography;  Surgeon: Elam Dutch, MD;  Location: [place] CV LAB;  Service: Cardiovascular;  Laterality: Bilateral;  . TONSILLECTOMY     Past Medical History:  Diagnosis Date  . Bronchitis   . Bulging disc    in neck  . Depression with anxiety   . Diabetes mellitus    Type II  . Fibromyalgia    pt. denies  . GERD (gastroesophageal reflux disease)   . Glaucoma   . History of bronchitis   . Hyperlipidemia   . Hypertension   . Overactive bladder   . Peripheral vascular disease ([place])   . Pneumonia   . Sleep apnea   . Tobacco abuse   . Urinary frequency     Physical exam:  Vitals:   08/02/17 1405  BP: (!) 153/96  Pulse: 62  Temp: (!) 97.5 F (36.4 C)  TempSrc: Oral  SpO2: 98%  Weight: 155 lb (70.3 kg)  Height: 5\' 3"  (1.6 m)    Neck: No carotid bruits  Chest: Clear to auscultation bilaterally  Cardiac: Regular rate and rhythm without murmur  Abdomen: Soft  nontender  Extremities: 2+ femoral pulses bilaterally 2+ left dorsalis pedis pulse absent pedal pulses right foot  Data: Patient had a duplex ultrasound of her bypass grafts today. Right side had monophasic flow extending from the external iliac all the way down. ABI on the right side was 0.89. Left side had triphasic to biphasic flow with some possible narrowing at the proximal anastomosis but an ABI of 1.  Assessment: Most likely worsening of right external iliac artery stenosis above her femoropopliteal bypass graft. Possible narrowing of the proximal femoral anastomosis on the left but with a normal ABIs and palpable pulsatile not inclined to investigate that necessarily further.  Plan: Patient will be scheduled for aortogram lower extremity runoff possible angioplasty or stenting of the right external iliac artery on 08/10/2017.  She may potentially also need femoral endarterectomy.  Risks benefits possible palpitations and procedure details of arteriogram as well as an intervention were discussed the patient today including not limited to bleeding infection contrast reaction vessel injury. She understands and agrees to proceed.  Ruta Hinds, MD [facility] Office: [phone] Pager: [phone]

## 2017-08-06 ENCOUNTER — Telehealth: Payer: Self-pay | Admitting: Vascular Surgery

## 2017-08-06 ENCOUNTER — Telehealth: Payer: Self-pay | Admitting: *Deleted
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
## 2017-08-14 NOTE — Telephone Encounter (Signed)
Error

## 2017-08-17 ENCOUNTER — Encounter (HOSPITAL_COMMUNITY): Admission: RE | Payer: Self-pay | Source: Ambulatory Visit

## 2017-08-17 ENCOUNTER — Ambulatory Visit (HOSPITAL_COMMUNITY): Admission: RE | Admit: 2017-08-17 | Payer: Medicare Other | Source: Ambulatory Visit | Admitting: Vascular Surgery

## 2017-08-17 SURGERY — ABDOMINAL AORTOGRAM W/LOWER EXTREMITY
Anesthesia: LOCAL

## 2017-08-29 DIAGNOSIS — Z23 Encounter for immunization: Secondary | ICD-10-CM | POA: Diagnosis not present

## 2017-09-06 DIAGNOSIS — M7752 Other enthesopathy of left foot: Secondary | ICD-10-CM | POA: Diagnosis not present

## 2017-09-06 DIAGNOSIS — M7751 Other enthesopathy of right foot: Secondary | ICD-10-CM | POA: Diagnosis not present

## 2017-09-06 DIAGNOSIS — M25572 Pain in left ankle and joints of left foot: Secondary | ICD-10-CM | POA: Diagnosis not present

## 2017-09-06 DIAGNOSIS — M25571 Pain in right ankle and joints of right foot: Secondary | ICD-10-CM | POA: Diagnosis not present

## 2017-09-11 ENCOUNTER — Ambulatory Visit (INDEPENDENT_AMBULATORY_CARE_PROVIDER_SITE_OTHER): Payer: Medicare Other

## 2017-09-11 ENCOUNTER — Ambulatory Visit (INDEPENDENT_AMBULATORY_CARE_PROVIDER_SITE_OTHER): Payer: Medicare Other | Admitting: Orthopaedic Surgery

## 2017-09-11 ENCOUNTER — Encounter (INDEPENDENT_AMBULATORY_CARE_PROVIDER_SITE_OTHER): Payer: Self-pay | Admitting: Orthopaedic Surgery

## 2017-09-11 VITALS — BP 125/85 | HR 60 | Ht 63.0 in | Wt 151.0 lb

## 2017-09-11 DIAGNOSIS — M9981 Other biomechanical lesions of cervical region: Secondary | ICD-10-CM | POA: Diagnosis not present

## 2017-09-11 DIAGNOSIS — M25512 Pain in left shoulder: Secondary | ICD-10-CM | POA: Diagnosis not present

## 2017-09-11 DIAGNOSIS — G8929 Other chronic pain: Secondary | ICD-10-CM

## 2017-09-11 DIAGNOSIS — M4802 Spinal stenosis, cervical region: Secondary | ICD-10-CM | POA: Insufficient documentation

## 2017-09-11 NOTE — Progress Notes (Signed)
Office Visit Note   Patient: Jill Higgins           Date of Birth: 08/24/1946           MRN: [id number] Visit Date: 09/11/2017              Requested by: Carol Ada, [place], [place] [address] PCP: Carol Ada, MD   Assessment & Plan: Visit Diagnoses:  1. Chronic left shoulder pain   2. Foraminal stenosis of cervical region     Plan: We reviewed previous MRI scan from 2011 also 2017 which showed severe foraminal stenosis C5-6 worse on the left than right. She has intermittent symptoms she states she has no problems moving her shoulder above her head outstretched reaching when she has pain and it hits her she has persistent aching pain that goes from her neck down into her shoulder and states at times it's incapacitating. She denies associated bowel bladder symptoms ,no gait problems. She will call should like to proceed with a new MRI scan if she wants to consider surgical intervention for her cervical spondylosis and foraminal stenosis which is progressed on MRI scan from 2011  To 2017.  Follow-Up Instructions: No Follow-up on file.   Orders:  Orders Placed This Encounter  Procedures  . XR Shoulder Left   No orders of the defined types were placed in this encounter.     Procedures: No procedures performed   Clinical Data: No additional findings.   Subjective: Chief Complaint  Patient presents with  . Left Shoulder - Pain    HPI 71 year old female returns she states she had previous injections right shoulder which gave her relief states she's had a total of 4 injections. She does have diabetes she's had associated weight gain and also set injections in her feet. States her diet hasn't changed. She uses salon patch on her shoulder when it bothers her. No gait disturbance. She's had previous MRI scan of her right shoulder shows some partial rotator cuff tearing and MRI scan cervical spine showed spondylosis primarily at C5-6 and by  foraminal stenosis worse on left than right.  Review of Systems via systems unchanged from 04/04/2017 otherwise than as mentioned in the above history of present illness.   Objective: Vital Signs: BP 125/85   Pulse 60   Ht 5\' 3"  (1.6 m)   Wt 151 lb (68.5 kg)   BMI 26.75 kg/m   Physical Exam  Constitutional: She is oriented to person, place, and time. She appears well-developed.  HENT:  Head: Normocephalic.  Right Ear: External ear normal.  Left Ear: External ear normal.  Eyes: Pupils are equal, round, and reactive to light.  Neck: No tracheal deviation present. No thyromegaly present.  Cardiovascular: Normal rate.   Pulmonary/Chest: Effort normal.  Abdominal: Soft.  Neurological: She is alert and oriented to person, place, and time.  Skin: Skin is warm and dry.  Psychiatric: She has a normal mood and affect. Her behavior is normal.    Ortho Exam patient has significant brachial plexus tenderness worse on left than right negative impingement right left shoulder negative drop arm test long head of biceps normal reflexes and normal she get her arms over her head easily with both arms without pain. Pain with Spurling's maneuver this pain radiates down to the index and thumb on the left hand.  Specialty Comments:  No specialty comments available.  Imaging: No results found.   PMFS History: Patient Active  Problem List   Diagnosis Date Noted  . Partial tear of right rotator cuff 04/04/2017  . Aftercare following surgery of the circulatory system 02/23/2016  . S/P femoropopliteal bypass surgery 02/23/2016  . PAD (peripheral artery disease) ([place]) 02/08/2016  . Leg edema, left 12/18/2013  . Aftercare following surgery of the circulatory system, [place] 11/19/2013  . Ischemic leg 10/21/2013  . Peripheral vascular disease, unspecified ([place]) 09/18/2013  . Preop cardiovascular exam 09/08/2013  . Hypertension   . Hyperlipidemia   . Peripheral vascular disease ([place])   . Fibromyalgia     . GERD (gastroesophageal reflux disease)   . Depression with anxiety   . Pain in limb 04/03/2013  . Atherosclerosis of native arteries of the extremities with intermittent claudication 08/01/2012  . Claudication ([place]) 12/28/2011   Past Medical History:  Diagnosis Date  . Bronchitis   . Bulging disc    in neck  . Depression with anxiety   . Diabetes mellitus    Type II  . Fibromyalgia    pt. denies  . GERD (gastroesophageal reflux disease)   . Glaucoma   . History of bronchitis   . Hyperlipidemia   . Hypertension   . Overactive bladder   . Peripheral vascular disease ([place])   . Pneumonia   . Sleep apnea   . Tobacco abuse   . Urinary frequency     Family History  Problem Relation Age of Onset  . Heart disease Father   . Heart attack Father        MI at age 59  . Hypertension Mother   . Alzheimer's disease Mother   . Hypertension Sister   . Diabetes Brother   . Hyperlipidemia Brother   . Hypertension Brother   . Heart disease Brother   . Heart attack Brother     Past Surgical History:  Procedure Laterality Date  . ABDOMINAL AORTAGRAM N/A 08/15/2013   Procedure: ABDOMINAL Maxcine Ham;  Surgeon: Elam Dutch, MD;  Location: [facility] CATH LAB;  Service: Cardiovascular;  Laterality: N/A;  . ABDOMINAL AORTAGRAM N/A 06/19/2014   Procedure: ABDOMINAL Maxcine Ham;  Surgeon: Elam Dutch, MD;  Location: [facility] CATH LAB;  Service: Cardiovascular;  Laterality: N/A;  . Aortogram w/ PTA  05/14/08,  11-04-10   Bilateral aortogram w/ bilateral  SFA PTA  stenting   . BREAST SURGERY Right    boil removal  . BUNIONECTOMY     L foot in the 1980s  . COLONOSCOPY     2014  . DILATION AND CURETTAGE OF UTERUS     X4  . Epidural shots in neck     . EYE SURGERY Bilateral 2016   cataract removal  . FEMORAL-POPLITEAL BYPASS GRAFT Left 10/21/2013   Procedure: LEFT FEMORAL-POPLITEAL ARTERY BYPASS WITH SAPHENOUS VEIN GRAFT , POPLITEAL ENDARTERECTOMY ,INTRAOPERATIVE ARTERIOGRAM, vein patch  angioplasty to popliteal artery;  Surgeon: Elam Dutch, MD;  Location: [facility] OR;  Service: Vascular;  Laterality: Left;  . FEMORAL-POPLITEAL BYPASS GRAFT Right 02/08/2016   Procedure: Right FEMORAL- to Above Knee POPLITEAL ARTERY Bypass Graft with reversed saphenous vein and Common Femoral Endarterectomy  with profundoplasty;  Surgeon: Elam Dutch, MD;  Location: [place];  Service: Vascular;  Laterality: Right;  . GROIN DEBRIDEMENT Left 10/28/2013   Procedure: left inner thigh DEBRIDEMENT;  Surgeon: Elam Dutch, MD;  Location: [place];  Service: Vascular;  Laterality: Left;  . HAND SURGERY Left 2010  . LOWER EXTREMITY ANGIOGRAM Bilateral 12/24/2015   Procedure: Lower Extremity Angiogram;  Surgeon: Elam Dutch, MD;  Location: [place] CV LAB;  Service: Cardiovascular;  Laterality: Bilateral;  . PERIPHERAL VASCULAR CATHETERIZATION N/A 12/24/2015   Procedure: Abdominal Aortogram;  Surgeon: Elam Dutch, MD;  Location: [place] CV LAB;  Service: Cardiovascular;  Laterality: N/A;  . PERIPHERAL VASCULAR CATHETERIZATION Left 12/24/2015   Procedure: Peripheral Vascular Balloon Angioplasty;  Surgeon: Elam Dutch, MD;  Location: [place] CV LAB;  Service: Cardiovascular;  Laterality: Left;  drug coated balloon  . PERIPHERAL VASCULAR CATHETERIZATION N/A 06/30/2016   Procedure: Abdominal Aortogram;  Surgeon: Elam Dutch, MD;  Location: [place] CV LAB;  Service: Cardiovascular;  Laterality: N/A;  . PERIPHERAL VASCULAR CATHETERIZATION Bilateral 06/30/2016   Procedure: Lower Extremity Angiography;  Surgeon: Elam Dutch, MD;  Location: [place] CV LAB;  Service: Cardiovascular;  Laterality: Bilateral;  . TONSILLECTOMY     Social History   Occupational History  . Retired Sales promotion account executive   Social History Main Topics  . Smoking status: Former Smoker    Packs/day: 0.10    Types: Cigarettes    Quit date: 10/2014  . Smokeless tobacco: Never Used     Comment: pt states that she is  using the E cig only   . Alcohol use 0.0 - 0.6 oz/week     Comment: Occasional  . Drug use: No  . Sexual activity: Not on file

## 2017-09-17 DIAGNOSIS — H401133 Primary open-angle glaucoma, bilateral, severe stage: Secondary | ICD-10-CM | POA: Diagnosis not present

## 2017-09-17 DIAGNOSIS — H53433 Sector or arcuate defects, bilateral: Secondary | ICD-10-CM | POA: Diagnosis not present

## 2017-09-24 DIAGNOSIS — M25571 Pain in right ankle and joints of right foot: Secondary | ICD-10-CM | POA: Diagnosis not present

## 2017-09-24 DIAGNOSIS — M25572 Pain in left ankle and joints of left foot: Secondary | ICD-10-CM | POA: Diagnosis not present

## 2017-10-17 DIAGNOSIS — H401133 Primary open-angle glaucoma, bilateral, severe stage: Secondary | ICD-10-CM | POA: Diagnosis not present

## 2017-11-12 DIAGNOSIS — S46811A Strain of other muscles, fascia and tendons at shoulder and upper arm level, right arm, initial encounter: Secondary | ICD-10-CM | POA: Diagnosis not present

## 2017-11-21 ENCOUNTER — Ambulatory Visit (INDEPENDENT_AMBULATORY_CARE_PROVIDER_SITE_OTHER): Payer: Commercial Managed Care - HMO

## 2017-11-21 ENCOUNTER — Ambulatory Visit (INDEPENDENT_AMBULATORY_CARE_PROVIDER_SITE_OTHER): Payer: Commercial Managed Care - HMO | Admitting: Orthopaedic Surgery

## 2017-11-21 ENCOUNTER — Encounter (INDEPENDENT_AMBULATORY_CARE_PROVIDER_SITE_OTHER): Payer: Self-pay | Admitting: Orthopaedic Surgery

## 2017-11-21 VITALS — BP 138/84 | HR 72 | Ht 63.0 in | Wt 160.0 lb

## 2017-11-21 DIAGNOSIS — M542 Cervicalgia: Secondary | ICD-10-CM

## 2017-11-21 DIAGNOSIS — M5412 Radiculopathy, cervical region: Secondary | ICD-10-CM | POA: Diagnosis not present

## 2017-11-21 MED ORDER — KETOROLAC TROMETHAMINE 30 MG/ML IJ SOLN: 30.0000 mg | Freq: Once | INTRAMUSCULAR | Status: AC

## 2017-11-21 MED ORDER — DIAZEPAM 5 MG PO TABS: ORAL_TABLET | ORAL | 0 refills | Status: DC

## 2017-11-21 MED ORDER — METHOCARBAMOL 500 MG PO TABS: 500.0000 mg | ORAL_TABLET | Freq: Three times a day (TID) | ORAL | 0 refills | Status: DC | PRN

## 2017-11-21 NOTE — Progress Notes (Signed)
Office Visit Note   Patient: Jill Higgins           Date of Birth: [birth year]           MRN: [id number] Visit Date: 11/21/2017              Requested by: Carol Ada, MD [place],  [id number] PCP: Carol Ada, MD   Assessment & Plan: Visit Diagnoses:  1. Neck pain   2. Radiculopathy, cervical region     Plan: With patient's worsening symptoms and failed conservative treatment we'll schedule cervical spine MRI to rule out HNP/stenosis. We'll also compare this to previous study that was done April 2017. It helps a given patient some relief she was given Toradol 30 mg IM injection and a prescription for Robaxin 500 mg 1 tab by mouth every 8 hours when necessary spasms. She will discontinue Flexeril that was given an urgent care. Valium 5 mg #2 tablets to be taken as directed before MRI.  Follow-Up Instructions: Return in about 2 weeks (around 12/05/2017) for With Dr. Lorin Mercy to review MRI.   Orders:  Orders Placed This Encounter  Procedures  . XR Cervical Spine 2 or 3 views  . MR Cervical Spine w/o contrast   Meds ordered this encounter  Medications  . methocarbamol (ROBAXIN) 500 MG tablet    Sig: Take 1 tablet (500 mg total) by mouth every 8 (eight) hours as needed for muscle spasms.    Dispense:  40 tablet    Refill:  0  . diazepam (VALIUM) 5 MG tablet    Sig: 1 po prior to MRI. Repeat if needed    Dispense:  2 tablet    Refill:  0      Procedures: No procedures performed   Clinical Data: No additional findings.   Subjective: Chief Complaint  Patient presents with  . Neck - Pain    HPI Patient comes in today with complaints of worsening right sided neck pain and right shoulder/upper arm pain. Patient has known history of cervical spondylosis. Patient has cervical spine MRI performed 02/26/2016 and report read C5-6 broad central disc protrusion. Bilateral uncovertebral degenerative changes. Moderate right and severe left  foraminal stenosis. C7-T1 left foraminal disc osteophyte complex with mild left foraminal stenosis. Patient continued have some chronic pain since that study but this became more severe 11/12/2017. She eventually went to the urgent care with equal was prescribed oral prednisone and Flexeril for spasms. States that she discontinue the prednisone due to elevating her blood sugars to 500. States that she was asymptomatic from that and did not require ER visit. She's not complaining of any pain radiating down the left arm. Does have some left-sided neck pain.  States that she has pain and popping on the right side when she turns her head. Review of Systems no current cardiac pulmonary GI GU issues Objective: Vital Signs: BP 138/84   Pulse 72   Ht 5\' 3"  (1.6 m)   Wt 160 lb (72.6 kg)   BMI 28.34 kg/m   Physical Exam  Constitutional: She is oriented to person, place, and time. No distress.  HENT:  Head: Normocephalic and atraumatic.  Eyes: EOM are normal. Pupils are equal, round, and reactive to light.  Neck:  Decreased service spine range of motion due to discomfort. Negative Spurling test. Right greater than left brachial plexus and trapezius tenderness.  Pulmonary/Chest: No respiratory distress.  Musculoskeletal:  Bilateral shoulders good range of motion.  Negative impingement test. Negative drop arm. No focal motor deficits. Neurovascular intact.  Neurological: She is alert and oriented to person, place, and time.  Skin: Skin is warm and dry.  Psychiatric: She has a normal mood and affect.    Ortho Exam  Specialty Comments:  No specialty comments available.  Imaging: No results found.   PMFS History: Patient Active Problem List   Diagnosis Date Noted  . Foraminal stenosis of cervical region 09/11/2017  . Partial tear of right rotator cuff 04/04/2017  . Aftercare following surgery of the circulatory system 02/23/2016  . S/P femoropopliteal bypass surgery 02/23/2016  . PAD  (peripheral artery disease) ([place]) 02/08/2016  . Leg edema, left 12/18/2013  . Aftercare following surgery of the circulatory system, [place] 11/19/2013  . Ischemic leg 10/21/2013  . Peripheral vascular disease, unspecified ([place]) 09/18/2013  . Preop cardiovascular exam 09/08/2013  . Hypertension   . Hyperlipidemia   . Peripheral vascular disease ([place])   . Fibromyalgia   . GERD (gastroesophageal reflux disease)   . Depression with anxiety   . Pain in limb 04/03/2013  . Atherosclerosis of native arteries of the extremities with intermittent claudication 08/01/2012  . Claudication ([place]) 12/28/2011   Past Medical History:  Diagnosis Date  . Bronchitis   . Bulging disc    in neck  . Depression with anxiety   . Diabetes mellitus    Type II  . Fibromyalgia    pt. denies  . GERD (gastroesophageal reflux disease)   . Glaucoma   . History of bronchitis   . Hyperlipidemia   . Hypertension   . Overactive bladder   . Peripheral vascular disease ([place])   . Pneumonia   . Sleep apnea   . Tobacco abuse   . Urinary frequency     Family History  Problem Relation Age of Onset  . Heart disease Father   . Heart attack Father        MI at age 34  . Hypertension Mother   . Alzheimer's disease Mother   . Hypertension Sister   . Diabetes Brother   . Hyperlipidemia Brother   . Hypertension Brother   . Heart disease Brother   . Heart attack Brother     Past Surgical History:  Procedure Laterality Date  . ABDOMINAL AORTAGRAM N/A 08/15/2013   Procedure: ABDOMINAL Maxcine Ham;  Surgeon: Elam Dutch, MD;  Location[facility] CATH LAB;  Service: Cardiovascular;  Laterality: N/A;  . ABDOMINAL AORTAGRAM N/A 06/19/2014   Procedure: ABDOMINAL Maxcine Ham;  Surgeon: Elam Dutch, MD;  Location: [facility] CATH LAB;  Service: Cardiovascular;  Laterality: N/A;  . Aortogram w/ PTA  05/14/08,  11-04-10   Bilateral aortogram w/ bilateral  SFA PTA  stenting   . BREAST SURGERY Right    boil removal  . BUNIONECTOMY       L foot in the 1980s  . COLONOSCOPY     2014  . DILATION AND CURETTAGE OF UTERUS     X4  . Epidural shots in neck     . EYE SURGERY Bilateral 2016   cataract removal  . FEMORAL-POPLITEAL BYPASS GRAFT Left 10/21/2013   Procedure: LEFT FEMORAL-POPLITEAL ARTERY BYPASS WITH SAPHENOUS VEIN GRAFT , POPLITEAL ENDARTERECTOMY ,INTRAOPERATIVE ARTERIOGRAM, vein patch angioplasty to popliteal artery;  Surgeon: Elam Dutch, MD;  Location[facility] OR;  Service: Vascular;  Laterality: Left;  . FEMORAL-POPLITEAL BYPASS GRAFT Right 02/08/2016   Procedure: Right FEMORAL- to Above Knee POPLITEAL ARTERY Bypass Graft with reversed saphenous vein  and Common Femoral Endarterectomy  with profundoplasty;  Surgeon: Elam Dutch, MD;  Location: [place];  Service: Vascular;  Laterality: Right;  . GROIN DEBRIDEMENT Left 10/28/2013   Procedure: left inner thigh DEBRIDEMENT;  Surgeon: Elam Dutch, MD;  Location: [place];  Service: Vascular;  Laterality: Left;  . HAND SURGERY Left 2010  . LOWER EXTREMITY ANGIOGRAM Bilateral 12/24/2015   Procedure: Lower Extremity Angiogram;  Surgeon: Elam Dutch, MD;  Location: [place] CV LAB;  Service: Cardiovascular;  Laterality: Bilateral;  . PERIPHERAL VASCULAR CATHETERIZATION N/A 12/24/2015   Procedure: Abdominal Aortogram;  Surgeon: Elam Dutch, MD;  Location: [place] CV LAB;  Service: Cardiovascular;  Laterality: N/A;  . PERIPHERAL VASCULAR CATHETERIZATION Left 12/24/2015   Procedure: Peripheral Vascular Balloon Angioplasty;  Surgeon: Elam Dutch, MD;  Location: [place] CV LAB;  Service: Cardiovascular;  Laterality: Left;  drug coated balloon  . PERIPHERAL VASCULAR CATHETERIZATION N/A 06/30/2016   Procedure: Abdominal Aortogram;  Surgeon: Elam Dutch, MD;  Location: [place] CV LAB;  Service: Cardiovascular;  Laterality: N/A;  . PERIPHERAL VASCULAR CATHETERIZATION Bilateral 06/30/2016   Procedure: Lower Extremity Angiography;  Surgeon: Elam Dutch,  MD;  Location: [place] CV LAB;  Service: Cardiovascular;  Laterality: Bilateral;  . TONSILLECTOMY     Social History   Occupational History  . 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 Smoking status: Former Smoker    Packs/day: 0.10    Types: Cigarettes    Last attempt to quit: 10/2014    Years since quitting: 3.0  . Smokeless tobacco: Never Used  . Tobacco comment: pt states that she is using the E cig only   Substance and Sexual Activity  . Alcohol use: Yes    Alcohol/week: 0.0 - 0.6 oz    Comment: Occasional  . Drug use: No  . Sexual activity: Not on file

## 2017-11-28 ENCOUNTER — Other Ambulatory Visit: Payer: Self-pay

## 2017-11-28 MED ORDER — GABAPENTIN 300 MG PO CAPS: 300.0000 mg | ORAL_CAPSULE | Freq: Three times a day (TID) | ORAL | 1 refills | Status: DC

## 2017-12-07 ENCOUNTER — Ambulatory Visit
Admission: RE | Admit: 2017-12-07 | Discharge: 2017-12-07 | Disposition: A | Discharge status: Home / Self Care | Payer: Medicare Other | Source: Ambulatory Visit | Attending: Surgery | Admitting: Surgery

## 2017-12-07 DIAGNOSIS — M4802 Spinal stenosis, cervical region: Secondary | ICD-10-CM | POA: Diagnosis not present

## 2017-12-07 DIAGNOSIS — M5412 Radiculopathy, cervical region: Secondary | ICD-10-CM

## 2017-12-10 DIAGNOSIS — I739 Peripheral vascular disease, unspecified: Secondary | ICD-10-CM | POA: Diagnosis not present

## 2017-12-10 DIAGNOSIS — Z7984 Long term (current) use of oral hypoglycemic drugs: Secondary | ICD-10-CM | POA: Diagnosis not present

## 2017-12-10 DIAGNOSIS — G4700 Insomnia, unspecified: Secondary | ICD-10-CM | POA: Diagnosis not present

## 2017-12-10 DIAGNOSIS — J449 Chronic obstructive pulmonary disease, unspecified: Secondary | ICD-10-CM | POA: Diagnosis not present

## 2017-12-10 DIAGNOSIS — E7800 Pure hypercholesterolemia, unspecified: Secondary | ICD-10-CM | POA: Diagnosis not present

## 2017-12-10 DIAGNOSIS — I10 Essential (primary) hypertension: Secondary | ICD-10-CM | POA: Diagnosis not present

## 2017-12-10 DIAGNOSIS — E1165 Type 2 diabetes mellitus with hyperglycemia: Secondary | ICD-10-CM | POA: Diagnosis not present

## 2017-12-10 DIAGNOSIS — F17290 Nicotine dependence, other tobacco product, uncomplicated: Secondary | ICD-10-CM | POA: Diagnosis not present

## 2017-12-10 DIAGNOSIS — K219 Gastro-esophageal reflux disease without esophagitis: Secondary | ICD-10-CM | POA: Diagnosis not present

## 2017-12-17 ENCOUNTER — Other Ambulatory Visit: Payer: Self-pay | Admitting: *Deleted

## 2017-12-17 MED ORDER — GABAPENTIN 300 MG PO CAPS: 300.0000 mg | ORAL_CAPSULE | Freq: Three times a day (TID) | ORAL | 1 refills | Status: DC

## 2017-12-21 ENCOUNTER — Telehealth (INDEPENDENT_AMBULATORY_CARE_PROVIDER_SITE_OTHER): Payer: Self-pay | Admitting: Radiology

## 2017-12-21 NOTE — Telephone Encounter (Signed)
Called to remind patient to schedule follow up appointment to MRI review with Dr. Lorin Mercy.

## 2017-12-28 DIAGNOSIS — H53433 Sector or arcuate defects, bilateral: Secondary | ICD-10-CM | POA: Diagnosis not present

## 2017-12-28 DIAGNOSIS — H401133 Primary open-angle glaucoma, bilateral, severe stage: Secondary | ICD-10-CM | POA: Diagnosis not present

## 2018-01-15 ENCOUNTER — Ambulatory Visit (INDEPENDENT_AMBULATORY_CARE_PROVIDER_SITE_OTHER): Payer: Medicare HMO | Admitting: Orthopaedic Surgery

## 2018-01-15 ENCOUNTER — Encounter (INDEPENDENT_AMBULATORY_CARE_PROVIDER_SITE_OTHER): Payer: Self-pay | Admitting: Orthopaedic Surgery

## 2018-01-15 VITALS — BP 134/81 | HR 57

## 2018-01-15 DIAGNOSIS — M4802 Spinal stenosis, cervical region: Secondary | ICD-10-CM

## 2018-01-15 DIAGNOSIS — M9981 Other biomechanical lesions of cervical region: Secondary | ICD-10-CM | POA: Diagnosis not present

## 2018-01-15 NOTE — Progress Notes (Signed)
Office Visit Note   Patient: Jill Higgins           Date of Birth: September 29, 1946           MRN: [id number] Visit Date: 01/15/2018              Requested by: Carol Ada, [place], [place] [id number] PCP: Carol Ada, MD   Assessment & Plan: Visit Diagnoses:  1. Foraminal stenosis of cervical region     Plan: We reviewed MRI images I gave her a copy of the report.  5-6 is her most significant level with severe left neuroforaminal stenosis and moderate right neuroforaminal stenosis.  Follow-Up Instructions: Return in about 1 month (around 02/12/2018).   Orders:  No orders of the defined types were placed in this encounter.  No orders of the defined types were placed in this encounter.     Procedures: No procedures performed   Clinical Data: No additional findings.   Subjective: Chief Complaint  Patient presents with  . Neck - Pain, Follow-up    MRI review    HPI patient returns with persistent problems with neck pain.  She has problems when she goes to sleep she uses 1 of the pillows that people tend to use on [employer] that is shaped like a horseshoe in order to get some rest and go to sleep.  She has pain with cervical rotation pain that radiates into her arms.  She has more symptoms on the right arm than left primarily radial side of the hand.  She does have a known partial rotator cuff tear on the right.  New MRI scan is available for review today.  Vania Rea has been added to her medication recently.  Review of Systems positive for PAD with previous arterial bypass.  Sleep apnea right shoulder partial rotator cuff tear, type 2 diabetes, glaucoma.  Cervical foraminal stenosis.,  GERD, depression with anxiety, fibromyalgia.  Otherwise negative as it pertains HPI.   Objective: Vital Signs: BP 134/81   Pulse (!) 57   Physical Exam  Constitutional: She is oriented to person, place, and time. She appears well-developed.  HENT:  Head:  Normocephalic.  Right Ear: External ear normal.  Left Ear: External ear normal.  Eyes: Pupils are equal, round, and reactive to light.  Neck: No tracheal deviation present. No thyromegaly present.  Cardiovascular: Normal rate.  Pulmonary/Chest: Effort normal.  Abdominal: Soft.  Neurological: She is alert and oriented to person, place, and time.  Skin: Skin is warm and dry.  Psychiatric: She has a normal mood and affect. Her behavior is normal.    Ortho Exam more left than right brachial plexus tenderness.  Negative drop arm test left shoulder.  Mild discomfort impingement right shoulder.  Upper extremity reflexes are 2+ and symmetrical no lower extremity clonus no lower extremity hyperreflexia normal heel toe gait.  Biceps triceps wrist flexion extension is strong no interosseous weakness.  Specialty Comments:  No specialty comments available.  Imaging: CLINICAL DATA:  Posterior neck pain radiating into the right arm. Symptoms began 5 weeks ago.  EXAM: MRI CERVICAL SPINE WITHOUT CONTRAST  TECHNIQUE: Multiplanar, multisequence MR imaging of the cervical spine was performed. No intravenous contrast was administered.  COMPARISON:  MRI cervical spine dated February 26, 2016.  FINDINGS: Alignment: Straightening of the normal cervical lordosis. Sagittal alignment is maintained.  Vertebrae: No fracture, evidence of discitis, or bone lesion.  Cord: Normal signal and morphology.  Posterior Fossa, vertebral  arteries, paraspinal tissues: Negative.  Disc levels:  Unchanged severe atlantodental degeneration with prominent pannus formation posteriorly.  C2-C3:  Negative.  C3-C4: Unchanged small central disc protrusion indenting the ventral thecal sac. Mild right greater than left facet arthropathy. No spinal canal or neuroforaminal stenosis.  C4-C5: Slightly more broad-based central disc protrusion indenting the ventral thecal sac. New mild left neuroforaminal stenosis  due to uncovertebral hypertrophy. No spinal canal stenosis.  C5-C6: Unchanged broad-based central disc protrusion with bilateral uncovertebral hypertrophy, resulting in moderate right and severe left neuroforaminal stenosis. No spinal canal stenosis.  C6-C7:  Negative.  C7-T1: Unchanged left foraminal disc osteophyte complex resulting in mild left neuroforaminal stenosis.  IMPRESSION: 1. Mild multilevel degenerative changes throughout the cervical spine, overall similar to prior study. Findings are worst at C5-C6 where there is moderate right and severe left neuroforaminal stenosis. 2. New mild left neuroforaminal stenosis at C4-C5 due to uncovertebral hypertrophy.   Electronically Signed   By: Titus Dubin M.D.   On: 12/07/2017 14:24    PMFS History: Patient Active Problem List   Diagnosis Date Noted  . Foraminal stenosis of cervical region 09/11/2017  . Partial tear of right rotator cuff 04/04/2017  . Aftercare following surgery of the circulatory system 02/23/2016  . S/P femoropopliteal bypass surgery 02/23/2016  . PAD (peripheral artery disease) ([place]) 02/08/2016  . Leg edema, left 12/18/2013  . Aftercare following surgery of the circulatory system, [place] 11/19/2013  . Ischemic leg 10/21/2013  . Peripheral vascular disease, unspecified ([place]) 09/18/2013  . Preop cardiovascular exam 09/08/2013  . Hypertension   . Hyperlipidemia   . Peripheral vascular disease ([place])   . Fibromyalgia   . GERD (gastroesophageal reflux disease)   . Depression with anxiety   . Pain in limb 04/03/2013  . Atherosclerosis of native arteries of the extremities with intermittent claudication 08/01/2012  . Claudication ([place]) 12/28/2011   Past Medical History:  Diagnosis Date  . Bronchitis   . Bulging disc    in neck  . Depression with anxiety   . Diabetes mellitus    Type II  . Fibromyalgia    pt. denies  . GERD (gastroesophageal reflux disease)   . Glaucoma   . History of  bronchitis   . Hyperlipidemia   . Hypertension   . Overactive bladder   . Peripheral vascular disease ([place])   . Pneumonia   . Sleep apnea   . Tobacco abuse   . Urinary frequency     Family History  Problem Relation Age of Onset  . Heart disease Father   . Heart attack Father        MI at age 46  . Hypertension Mother   . Alzheimer's disease Mother   . Hypertension Sister   . Diabetes Brother   . Hyperlipidemia Brother   . Hypertension Brother   . Heart disease Brother   . Heart attack Brother     Past Surgical History:  Procedure Laterality Date  . ABDOMINAL AORTAGRAM N/A 08/15/2013   Procedure: ABDOMINAL Maxcine Ham;  Surgeon: Elam Dutch, MD;  Location: [facility] CATH LAB;  Service: Cardiovascular;  Laterality: N/A;  . ABDOMINAL AORTAGRAM N/A 06/19/2014   Procedure: ABDOMINAL Maxcine Ham;  Surgeon: Elam Dutch, MD;  Location: [facility] CATH LAB;  Service: Cardiovascular;  Laterality: N/A;  . Aortogram w/ PTA  05/14/08,  11-04-10   Bilateral aortogram w/ bilateral  SFA PTA  stenting   . BREAST SURGERY Right    boil removal  . BUNIONECTOMY  L foot in the 1980s  . COLONOSCOPY     2014  . DILATION AND CURETTAGE OF UTERUS     X4  . Epidural shots in neck     . EYE SURGERY Bilateral 2016   cataract removal  . FEMORAL-POPLITEAL BYPASS GRAFT Left 10/21/2013   Procedure: LEFT FEMORAL-POPLITEAL ARTERY BYPASS WITH SAPHENOUS VEIN GRAFT , POPLITEAL ENDARTERECTOMY ,INTRAOPERATIVE ARTERIOGRAM, vein patch angioplasty to popliteal artery;  Surgeon: Elam Dutch, MD;  Location: [facility] OR;  Service: Vascular;  Laterality: Left;  . FEMORAL-POPLITEAL BYPASS GRAFT Right 02/08/2016   Procedure: Right FEMORAL- to Above Knee POPLITEAL ARTERY Bypass Graft with reversed saphenous vein and Common Femoral Endarterectomy  with profundoplasty;  Surgeon: Elam Dutch, MD;  Location: [place];  Service: Vascular;  Laterality: Right;  . GROIN DEBRIDEMENT Left 10/28/2013   Procedure: left inner thigh  DEBRIDEMENT;  Surgeon: Elam Dutch, MD;  Location: [place];  Service: Vascular;  Laterality: Left;  . HAND SURGERY Left 2010  . LOWER EXTREMITY ANGIOGRAM Bilateral 12/24/2015   Procedure: Lower Extremity Angiogram;  Surgeon: Elam Dutch, MD;  Location: [place] CV LAB;  Service: Cardiovascular;  Laterality: Bilateral;  . PERIPHERAL VASCULAR CATHETERIZATION N/A 12/24/2015   Procedure: Abdominal Aortogram;  Surgeon: Elam Dutch, MD;  Location: [place] CV LAB;  Service: Cardiovascular;  Laterality: N/A;  . PERIPHERAL VASCULAR CATHETERIZATION Left 12/24/2015   Procedure: Peripheral Vascular Balloon Angioplasty;  Surgeon: Elam Dutch, MD;  Location: [place] CV LAB;  Service: Cardiovascular;  Laterality: Left;  drug coated balloon  . PERIPHERAL VASCULAR CATHETERIZATION N/A 06/30/2016   Procedure: Abdominal Aortogram;  Surgeon: Elam Dutch, MD;  Location: [place] CV LAB;  Service: Cardiovascular;  Laterality: N/A;  . PERIPHERAL VASCULAR CATHETERIZATION Bilateral 06/30/2016   Procedure: Lower Extremity Angiography;  Surgeon: Elam Dutch, MD;  Location: [place] CV LAB;  Service: Cardiovascular;  Laterality: Bilateral;  . TONSILLECTOMY     Social History   Occupational History  . 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 Smoking status: Former Smoker    Packs/day: 0.10    Types: Cigarettes    Last attempt to quit: 10/2014    Years since quitting: 3.2  . Smokeless tobacco: Never Used  . Tobacco comment: pt states that she is using the E cig only   Substance and Sexual Activity  . Alcohol use: Yes    Alcohol/week: 0.0 - 0.6 oz    Comment: Occasional  . Drug use: No  . Sexual activity: Not on file

## 2018-01-16 ENCOUNTER — Encounter (INDEPENDENT_AMBULATORY_CARE_PROVIDER_SITE_OTHER): Payer: Self-pay | Admitting: Orthopaedic Surgery

## 2018-02-12 ENCOUNTER — Ambulatory Visit (INDEPENDENT_AMBULATORY_CARE_PROVIDER_SITE_OTHER): Payer: Medicare HMO | Admitting: Orthopaedic Surgery

## 2018-02-20 ENCOUNTER — Ambulatory Visit (INDEPENDENT_AMBULATORY_CARE_PROVIDER_SITE_OTHER): Payer: Medicare HMO | Admitting: Orthopaedic Surgery

## 2018-02-28 DIAGNOSIS — Z7984 Long term (current) use of oral hypoglycemic drugs: Secondary | ICD-10-CM | POA: Diagnosis not present

## 2018-02-28 DIAGNOSIS — E1165 Type 2 diabetes mellitus with hyperglycemia: Secondary | ICD-10-CM | POA: Diagnosis not present

## 2018-03-01 DIAGNOSIS — K219 Gastro-esophageal reflux disease without esophagitis: Secondary | ICD-10-CM | POA: Diagnosis not present

## 2018-03-05 ENCOUNTER — Encounter (INDEPENDENT_AMBULATORY_CARE_PROVIDER_SITE_OTHER): Payer: Self-pay | Admitting: Orthopaedic Surgery

## 2018-03-05 ENCOUNTER — Telehealth: Payer: Self-pay | Admitting: Cardiology

## 2018-03-05 ENCOUNTER — Ambulatory Visit (INDEPENDENT_AMBULATORY_CARE_PROVIDER_SITE_OTHER): Payer: Medicare HMO | Admitting: Orthopaedic Surgery

## 2018-03-05 VITALS — BP 120/81 | HR 60 | Ht 63.0 in | Wt 147.0 lb

## 2018-03-05 DIAGNOSIS — M9981 Other biomechanical lesions of cervical region: Secondary | ICD-10-CM | POA: Diagnosis not present

## 2018-03-05 DIAGNOSIS — M4802 Spinal stenosis, cervical region: Secondary | ICD-10-CM

## 2018-03-05 DIAGNOSIS — M4722 Other spondylosis with radiculopathy, cervical region: Secondary | ICD-10-CM | POA: Diagnosis not present

## 2018-03-05 NOTE — Progress Notes (Signed)
Office Visit Note   Patient: Jill Higgins           Date of Birth: 1946-08-21           MRN: [id number] Visit Date: 03/05/2018              Requested by: Carol Ada, [place] [place], [place] [address] PCP: Carol Ada, MD   Assessment & Plan: Visit Diagnoses:  1. Foraminal stenosis of cervical region   2. Other spondylosis with radiculopathy, cervical region     Plan: Patient's symptoms have progressed now is waking her up at night problems with daily activity bathing dressing.  She has sharp pain that she tear that radiates down her arm.  Her cardiologist is Dr. Stanford Breed prior to her surgery.  Fusion C5-6 with overnight stay for severe foraminal stenosis.  Procedure discussed use of soft collar postoperatively.  Questions were elicited and answered.  Risk of dysphasia or dysphonia pseudoarthrosis fusion of the anterior fusion did not heal.  She understands and requests we proceed.  Follow-Up Instructions: No follow-ups on file.   Orders:  No orders of the defined types were placed in this encounter.  No orders of the defined types were placed in this encounter.     Procedures: No procedures performed   Clinical Data: No additional findings.   Subjective: Chief Complaint  Patient presents with  . Neck - Pain, Follow-up    HPI 72 year old female returns with progressive neck and shoulder pain that radiates down to the radial side of her hand worse on the left than right.  She continues to have symptoms in her right shoulder consistent with a partial rotator cuff tear.  She has to sleep with a horseshoe shaped pillow similar to those she is on [employer] in order for her to sleep.  When she rotates her neck it wakes her up she wakes up multiple times at night.  With rotation she feels sharp electrical pain that radiates down to her left hand.  She states she has to be very careful during the day to avoid turning her neck due to the sharp pinching  pain.  She rates that pain is severe.  The last 4 months pain has progressed in intensity is more frequent chronic aching pain in her neck on the left side.  Review of Systems review of systems positive for peripheral arterial disease previous arterial bypass.  Sleep apnea, right shoulder partial rotator cuff tear.  Glaucoma type 2 diabetes, cervical foraminal stenosis severe on the left at C5-6 moderately severe on the right.  GERD, fibromyalgia.  Femoropopliteal bypass. 14 point review of systems otherwise negative as as it pertains HPI.   Objective: Vital Signs: BP 120/81   Pulse 60   Ht 5\' 3"  (1.6 m)   Wt 147 lb (66.7 kg)   BMI 26.04 kg/m   Physical Exam  Constitutional: She is oriented to person, place, and time. She appears well-developed.  HENT:  Head: Normocephalic.  Right Ear: External ear normal.  Left Ear: External ear normal.  Eyes: Pupils are equal, round, and reactive to light.  Neck: No tracheal deviation present. No thyromegaly present.  Cardiovascular: Normal rate.  Pulmonary/Chest: Effort normal.  Abdominal: Soft.  Neurological: She is alert and oriented to person, place, and time.  Skin: Skin is warm and dry.  Psychiatric: She has a normal mood and affect. Her behavior is normal.    Ortho Exam patient has severe brachial plexus  tenderness on the left moderate severe on the right.  Negative drop arm test left shoulder.  Pain with impingement test right shoulder.  Some pain but good resistive strength with supraspinatus testing right shoulder.  Reflexes are intact decreased sensation radial side left hand more so than right.  No grip weakness.  Triceps is strong.  Normal heel toe gait no lower extremity hyperreflexia.  No rash over exposed skin.  Thyroid is normal trachea is midline.  Specialty Comments:  No specialty comments available.  Imaging: No results found.   PMFS History: Patient Active Problem List   Diagnosis Date Noted  . Other spondylosis with  radiculopathy, cervical region 03/05/2018  . Foraminal stenosis of cervical region 09/11/2017  . Partial tear of right rotator cuff 04/04/2017  . Aftercare following surgery of the circulatory system 02/23/2016  . S/P femoropopliteal bypass surgery 02/23/2016  . PAD (peripheral artery disease) ([place]) 02/08/2016  . Leg edema, left 12/18/2013  . Aftercare following surgery of the circulatory system, [place] 11/19/2013  . Ischemic leg 10/21/2013  . Peripheral vascular disease, unspecified ([place]) 09/18/2013  . Preop cardiovascular exam 09/08/2013  . Hypertension   . Hyperlipidemia   . Peripheral vascular disease ([place])   . Fibromyalgia   . GERD (gastroesophageal reflux disease)   . Depression with anxiety   . Pain in limb 04/03/2013  . Atherosclerosis of native arteries of the extremities with intermittent claudication 08/01/2012  . Claudication ([place]) 12/28/2011   Past Medical History:  Diagnosis Date  . Bronchitis   . Bulging disc    in neck  . Depression with anxiety   . Diabetes mellitus    Type II  . Fibromyalgia    pt. denies  . GERD (gastroesophageal reflux disease)   . Glaucoma   . History of bronchitis   . Hyperlipidemia   . Hypertension   . Overactive bladder   . Peripheral vascular disease ([place])   . Pneumonia   . Sleep apnea   . Tobacco abuse   . Urinary frequency     Family History  Problem Relation Age of Onset  . Heart disease Father   . Heart attack Father        MI at age 26  . Hypertension Mother   . Alzheimer's disease Mother   . Hypertension Sister   . Diabetes Brother   . Hyperlipidemia Brother   . Hypertension Brother   . Heart disease Brother   . Heart attack Brother     Past Surgical History:  Procedure Laterality Date  . ABDOMINAL AORTAGRAM N/A 08/15/2013   Procedure: ABDOMINAL Maxcine Ham;  Surgeon: Elam Dutch, MD;  Location[facility] C[facility] LAB;  Service: Cardiovascular;  Laterality: N/A;  . ABDOMINAL AORTAGRAM N/A 06/19/2014   Procedure:  ABDOMINAL Maxcine Ham;  Surgeon: Elam Dutch, MD;  Location: [facility] CATH LAB;  Service: Cardiovascular;  Laterality: N/A;  . Aortogram w/ PTA  05/14/08,  11-04-10   Bilateral aortogram w/ bilateral  SFA PTA  stenting   . BREAST SURGERY Right    boil removal  . BUNIONECTOMY     L foot in the 1980s  . COLONOSCOPY     2014  . DILATION AND CURETTAGE OF UTERUS     X4  . Epidural shots in neck     . EYE SURGERY Bilateral 2016   cataract removal  . FEMORAL-POPLITEAL BYPASS GRAFT Left 10/21/2013   Procedure: LEFT FEMORAL-POPLITEAL ARTERY BYPASS WITH SAPHENOUS VEIN GRAFT , POPLITEAL ENDARTERECTOMY ,INTRAOPERATIVE ARTERIOGRAM, vein patch angioplasty  to popliteal artery;  Surgeon: Elam Dutch, MD;  Location: [facility];  Service: Vascular;  Laterality: Left;  . FEMORAL-POPLITEAL BYPASS GRAFT Right 02/08/2016   Procedure: Right FEMORAL- to Above Knee POPLITEAL ARTERY Bypass Graft with reversed saphenous vein and Common Femoral Endarterectomy  with profundoplasty;  Surgeon: Elam Dutch, MD;  Location: [facility];  Service: Vascular;  Laterality: Right;  . GROIN DEBRIDEMENT Left 10/28/2013   Procedure: left inner thigh DEBRIDEMENT;  Surgeon: Elam Dutch, MD;  Location: [facility];  Service: Vascular;  Laterality: Left;  . HAND SURGERY Left 2010  . LOWER EXTREMITY ANGIOGRAM Bilateral 12/24/2015   Procedure: Lower Extremity Angiogram;  Surgeon: Elam Dutch, MD;  Location: [facility] CV LAB;  Service: Cardiovascular;  Laterality: Bilateral;  . PERIPHERAL VASCULAR CATHETERIZATION N/A 12/24/2015   Procedure: Abdominal Aortogram;  Surgeon: Elam Dutch, MD;  Location: [facility] CV LAB;  Service: Cardiovascular;  Laterality: N/A;  . PERIPHERAL VASCULAR CATHETERIZATION Left 12/24/2015   Procedure: Peripheral Vascular Balloon Angioplasty;  Surgeon: Elam Dutch, MD;  Location: [facility] CV LAB;  Service: Cardiovascular;  Laterality: Left;  drug coated balloon  . PERIPHERAL VASCULAR CATHETERIZATION N/A  06/30/2016   Procedure: Abdominal Aortogram;  Surgeon: Elam Dutch, MD;  Location: [facility] CV LAB;  Service: Cardiovascular;  Laterality: N/A;  . PERIPHERAL VASCULAR CATHETERIZATION Bilateral 06/30/2016   Procedure: Lower Extremity Angiography;  Surgeon: Elam Dutch, MD;  Location: [facility] CV LAB;  Service: Cardiovascular;  Laterality: Bilateral;  . TONSILLECTOMY     Social History   Occupational History  . 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 Smoking status: Former Smoker    Packs/day: 0.10    Types: Cigarettes    Last attempt to quit: 10/2014    Years since quitting: 3.3  . Smokeless tobacco: Never Used  . Tobacco comment: pt states that she is using the E cig only   Substance and Sexual Activity  . Alcohol use: Yes    Alcohol/week: 0.0 - 0.6 oz    Comment: Occasional  . Drug use: No  . Sexual activity: Not on file

## 2018-03-05 NOTE — Telephone Encounter (Signed)
   [facility] Pre-operative Risk Assessment    Request for surgical clearance:  1. What type of surgery is being performed? C5-6 anterior cervical discectomy & fusion    2. When is this surgery scheduled? TBD   3. What type of clearance is required (medical clearance vs. Pharmacy clearance to hold med vs. Both)? Both   4. Are there any medications that need to be held prior to surgery and how long? Not specified    5. Practice name and name of physician performing surgery? Dr. Rodell Perna @ [facility]   6. What is your office phone number [phone])    7.   What is your office fax number [phone]) please send to attn: Debbie  8.   Anesthesia type (None, local, MAC, general) ? Not specified    Fidel Levy 03/05/2018, 3:42 PM  _________________________________________________________________   (provider comments below)

## 2018-03-06 NOTE — Telephone Encounter (Signed)
   Primary Cardiologist: [facility]  Chart reviewed as part of pre-operative protocol coverage. Because of Jill Higgins's past medical history and time since last visit, he/she will require a follow-up visit in order to better assess preoperative cardiovascular risk.  Pre-op covering staff: - Please schedule appointment and call patient to inform them. - Please contact requesting surgeon's office via preferred method (i.e, phone, fax) to inform them of need for appointment prior to surgery.  Truitt Merle, NP  03/06/2018, 1:40 PM

## 2018-03-07 NOTE — Telephone Encounter (Signed)
LMOVM TO CONTACT CLINIC BACK  TO SCHEDULE AN PRE OP CLEARANCE APPT.

## 2018-03-08 ENCOUNTER — Emergency Department (HOSPITAL_COMMUNITY): Payer: Medicare HMO

## 2018-03-08 ENCOUNTER — Encounter (HOSPITAL_COMMUNITY): Payer: Self-pay | Admitting: Emergency Medicine

## 2018-03-08 ENCOUNTER — Emergency Department (HOSPITAL_COMMUNITY)
Admission: EM | Admit: 2018-03-08 | Discharge: 2018-03-09 | Disposition: A | Discharge status: Home / Self Care | Payer: Medicare HMO | Attending: Emergency Medicine | Admitting: Emergency Medicine

## 2018-03-08 DIAGNOSIS — Z7984 Long term (current) use of oral hypoglycemic drugs: Secondary | ICD-10-CM | POA: Insufficient documentation

## 2018-03-08 DIAGNOSIS — R1012 Left upper quadrant pain: Secondary | ICD-10-CM | POA: Diagnosis not present

## 2018-03-08 DIAGNOSIS — Z87891 Personal history of nicotine dependence: Secondary | ICD-10-CM | POA: Insufficient documentation

## 2018-03-08 DIAGNOSIS — M542 Cervicalgia: Secondary | ICD-10-CM | POA: Diagnosis not present

## 2018-03-08 DIAGNOSIS — S0093XA Contusion of unspecified part of head, initial encounter: Secondary | ICD-10-CM | POA: Diagnosis not present

## 2018-03-08 DIAGNOSIS — R079 Chest pain, unspecified: Secondary | ICD-10-CM | POA: Insufficient documentation

## 2018-03-08 DIAGNOSIS — S61412A Laceration without foreign body of left hand, initial encounter: Secondary | ICD-10-CM | POA: Diagnosis not present

## 2018-03-08 DIAGNOSIS — R51 Headache: Secondary | ICD-10-CM | POA: Insufficient documentation

## 2018-03-08 DIAGNOSIS — Z7982 Long term (current) use of aspirin: Secondary | ICD-10-CM | POA: Diagnosis not present

## 2018-03-08 DIAGNOSIS — E119 Type 2 diabetes mellitus without complications: Secondary | ICD-10-CM | POA: Insufficient documentation

## 2018-03-08 DIAGNOSIS — T148XXA Other injury of unspecified body region, initial encounter: Secondary | ICD-10-CM | POA: Diagnosis not present

## 2018-03-08 DIAGNOSIS — Z23 Encounter for immunization: Secondary | ICD-10-CM | POA: Diagnosis not present

## 2018-03-08 DIAGNOSIS — S299XXA Unspecified injury of thorax, initial encounter: Secondary | ICD-10-CM | POA: Diagnosis not present

## 2018-03-08 DIAGNOSIS — S3991XA Unspecified injury of abdomen, initial encounter: Secondary | ICD-10-CM | POA: Diagnosis not present

## 2018-03-08 DIAGNOSIS — S0990XA Unspecified injury of head, initial encounter: Secondary | ICD-10-CM | POA: Diagnosis not present

## 2018-03-08 DIAGNOSIS — Z79899 Other long term (current) drug therapy: Secondary | ICD-10-CM | POA: Insufficient documentation

## 2018-03-08 DIAGNOSIS — Z91040 Latex allergy status: Secondary | ICD-10-CM | POA: Insufficient documentation

## 2018-03-08 DIAGNOSIS — S199XXA Unspecified injury of neck, initial encounter: Secondary | ICD-10-CM | POA: Diagnosis not present

## 2018-03-08 DIAGNOSIS — R220 Localized swelling, mass and lump, head: Secondary | ICD-10-CM | POA: Diagnosis not present

## 2018-03-08 DIAGNOSIS — S0993XA Unspecified injury of face, initial encounter: Secondary | ICD-10-CM | POA: Diagnosis not present

## 2018-03-08 LAB — CBC WITH DIFFERENTIAL/PLATELET
Basophils Absolute: 0 10*3/uL (ref 0.0–0.1)
Basophils Relative: 1 %
Eosinophils Absolute: 0.2 10*3/uL (ref 0.0–0.7)
Eosinophils Relative: 3 %
HCT: 45.7 % (ref 36.0–46.0)
Hemoglobin: 15 g/dL (ref 12.0–15.0)
Lymphocytes Relative: 41 %
Lymphs Abs: 3.1 10*3/uL (ref 0.7–4.0)
MCH: 29.4 pg (ref 26.0–34.0)
MCHC: 32.8 g/dL (ref 30.0–36.0)
MCV: 89.4 fL (ref 78.0–100.0)
Monocytes Absolute: 0.4 10*3/uL (ref 0.1–1.0)
Monocytes Relative: 6 %
Neutro Abs: 3.8 10*3/uL (ref 1.7–7.7)
Neutrophils Relative %: 49 %
Platelets: 302 10*3/uL (ref 150–400)
RBC: 5.11 MIL/uL (ref 3.87–5.11)
RDW: 13.9 % (ref 11.5–15.5)
WBC: 7.5 10*3/uL (ref 4.0–10.5)

## 2018-03-08 LAB — URINALYSIS, ROUTINE W REFLEX MICROSCOPIC
Bilirubin Urine: NEGATIVE
Glucose, UA: >=500 mg/dL — AB
Hgb urine dipstick: NEGATIVE
Ketones, ur: 20 mg/dL — AB
Leukocytes, UA: NEGATIVE
Nitrite: NEGATIVE
Protein, ur: NEGATIVE mg/dL
Specific Gravity, Urine: 1.025 (ref 1.005–1.030)
pH: 8 (ref 5.0–8.0)

## 2018-03-08 LAB — COMPREHENSIVE METABOLIC PANEL
ALT: 17 U/L (ref 14–54)
AST: 22 U/L (ref 15–41)
Albumin: 4.3 g/dL (ref 3.5–5.0)
Alkaline Phosphatase: 68 U/L (ref 38–126)
Anion gap: 11 (ref 5–15)
BUN: 16 mg/dL (ref 6–20)
CO2: 24 mmol/L (ref 22–32)
Calcium: 9.7 mg/dL (ref 8.9–10.3)
Chloride: 107 mmol/L (ref 101–111)
Creatinine, Ser: 0.9 mg/dL (ref 0.44–1.00)
GFR calc Af Amer: >60 mL/min (ref >60)
GFR calc non Af Amer: >60 mL/min (ref >60)
Glucose, Bld: 155 mg/dL — ABNORMAL HIGH (ref 65–99)
Potassium: 3.7 mmol/L (ref 3.5–5.1)
Sodium: 142 mmol/L (ref 135–145)
Total Bilirubin: 0.2 mg/dL — ABNORMAL LOW (ref 0.3–1.2)
Total Protein: 8.4 g/dL — ABNORMAL HIGH (ref 6.5–8.1)

## 2018-03-08 LAB — I-STAT CREATININE, ED: Creatinine, Ser: 0.8 mg/dL (ref 0.44–1.00)

## 2018-03-08 MED ORDER — IOPAMIDOL (ISOVUE-300) INJECTION 61%
INTRAVENOUS | Status: AC
  Administered 2018-03-09
  Filled 2018-03-08: qty 100

## 2018-03-08 MED ORDER — MORPHINE SULFATE (PF) 2 MG/ML IV SOLN
2.0000 mg | Freq: Once | INTRAVENOUS | Status: AC
  Administered 2018-03-08: 2 mg via INTRAVENOUS
  Filled 2018-03-08: qty 1

## 2018-03-08 MED ORDER — TETANUS-DIPHTH-ACELL PERTUSSIS 5-2.5-18.5 LF-MCG/0.5 IM SUSP
0.5000 mL | Freq: Once | INTRAMUSCULAR | Status: AC
Start: 2018-03-08 — End: 2018-03-08
  Administered 2018-03-08: 0.5 mL via INTRAMUSCULAR
  Filled 2018-03-08: qty 0.5

## 2018-03-08 MED ORDER — IOPAMIDOL (ISOVUE-300) INJECTION 61%
100.0000 mL | Freq: Once | INTRAVENOUS | Status: AC | PRN
  Administered 2018-03-08: 100 mL via INTRAVENOUS
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
## 2018-03-08 NOTE — Discharge Instructions (Addendum)
Today you received medications that may make you sleepy or impair your ability to make decisions.  For the next 24 hours please do not drive, operate heavy machinery, care for a small child with out another adult present, or perform any activities that may cause harm to you or someone else if you were to fall asleep or be impaired.   You are being prescribed a medication which may make you sleepy. Please follow up of listed precautions for at least 24 hours after taking one dose.  Your imaging was normal.  Please keep the wounds clean and dry.  Make sure that you follow-up with a primary care doctor.  Also discussed following up with your dentist.  May take Motrin and Tylenol.
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
## 2018-03-09 MED ORDER — METHOCARBAMOL 500 MG PO TABS: 500.0000 mg | ORAL_TABLET | Freq: Two times a day (BID) | ORAL | 0 refills | Status: DC

## 2018-03-09 MED ORDER — LIDOCAINE 5 % EX PTCH: 1.0000 | MEDICATED_PATCH | CUTANEOUS | 0 refills | Status: DC
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
## 2018-03-12 ENCOUNTER — Telehealth: Payer: Self-pay
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
## 2018-03-13 DIAGNOSIS — M62838 Other muscle spasm: Secondary | ICD-10-CM | POA: Diagnosis not present

## 2018-03-13 DIAGNOSIS — S161XXA Strain of muscle, fascia and tendon at neck level, initial encounter: Secondary | ICD-10-CM | POA: Diagnosis not present

## 2018-03-15 ENCOUNTER — Telehealth (INDEPENDENT_AMBULATORY_CARE_PROVIDER_SITE_OTHER): Payer: Self-pay | Admitting: Orthopaedic Surgery

## 2018-03-15 NOTE — Telephone Encounter (Signed)
fyi
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
## 2018-03-20 ENCOUNTER — Ambulatory Visit: Payer: Medicare Other | Admitting: Endocrinology

## 2018-03-27 DIAGNOSIS — M25571 Pain in right ankle and joints of right foot: Secondary | ICD-10-CM | POA: Diagnosis not present

## 2018-03-27 DIAGNOSIS — M21621 Bunionette of right foot: Secondary | ICD-10-CM | POA: Diagnosis not present

## 2018-03-27 DIAGNOSIS — M7751 Other enthesopathy of right foot: Secondary | ICD-10-CM | POA: Diagnosis not present

## 2018-04-04 ENCOUNTER — Ambulatory Visit: Payer: Medicare HMO | Admitting: Endocrinology

## 2018-04-04 DIAGNOSIS — S161XXD Strain of muscle, fascia and tendon at neck level, subsequent encounter: Secondary | ICD-10-CM | POA: Diagnosis not present

## 2018-04-04 DIAGNOSIS — E1169 Type 2 diabetes mellitus with other specified complication: Secondary | ICD-10-CM | POA: Diagnosis not present

## 2018-04-04 DIAGNOSIS — Z7984 Long term (current) use of oral hypoglycemic drugs: Secondary | ICD-10-CM | POA: Diagnosis not present

## 2018-04-09 DIAGNOSIS — H401133 Primary open-angle glaucoma, bilateral, severe stage: Secondary | ICD-10-CM | POA: Diagnosis not present

## 2018-04-09 DIAGNOSIS — H53433 Sector or arcuate defects, bilateral: Secondary | ICD-10-CM | POA: Diagnosis not present

## 2018-04-09 DIAGNOSIS — E119 Type 2 diabetes mellitus without complications: Secondary | ICD-10-CM | POA: Diagnosis not present

## 2018-04-09 DIAGNOSIS — Z961 Presence of intraocular lens: Secondary | ICD-10-CM | POA: Diagnosis not present

## 2018-04-10 DIAGNOSIS — M25571 Pain in right ankle and joints of right foot: Secondary | ICD-10-CM | POA: Diagnosis not present

## 2018-04-10 DIAGNOSIS — M7751 Other enthesopathy of right foot: Secondary | ICD-10-CM | POA: Diagnosis not present

## 2018-04-19 ENCOUNTER — Other Ambulatory Visit: Payer: Self-pay | Admitting: Family Medicine

## 2018-04-19 DIAGNOSIS — Z1231 Encounter for screening mammogram for malignant neoplasm of breast: Secondary | ICD-10-CM

## 2018-04-25 ENCOUNTER — Encounter: Payer: Self-pay | Admitting: *Deleted

## 2018-04-25 ENCOUNTER — Other Ambulatory Visit: Payer: Self-pay | Admitting: *Deleted

## 2018-04-25 ENCOUNTER — Ambulatory Visit: Payer: Medicare HMO | Admitting: Vascular Surgery

## 2018-04-25 ENCOUNTER — Other Ambulatory Visit: Payer: Self-pay

## 2018-04-25 VITALS — BP 128/81 | HR 58 | Temp 97.3°F | Resp 16 | Ht 63.0 in | Wt 147.0 lb

## 2018-04-25 DIAGNOSIS — I739 Peripheral vascular disease, unspecified: Secondary | ICD-10-CM
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
## 2018-05-02 DIAGNOSIS — N3001 Acute cystitis with hematuria: Secondary | ICD-10-CM | POA: Diagnosis not present

## 2018-05-02 DIAGNOSIS — R319 Hematuria, unspecified: Secondary | ICD-10-CM | POA: Diagnosis not present

## 2018-05-10 ENCOUNTER — Encounter (HOSPITAL_COMMUNITY)
Admission: RE | Disposition: A | Discharge status: Home / Self Care | Payer: Self-pay | Source: Ambulatory Visit | Attending: Vascular Surgery

## 2018-05-10 ENCOUNTER — Ambulatory Visit: Payer: Medicare HMO

## 2018-05-10 ENCOUNTER — Other Ambulatory Visit: Payer: Self-pay | Admitting: *Deleted

## 2018-05-10 ENCOUNTER — Ambulatory Visit (HOSPITAL_COMMUNITY)
Admission: RE | Admit: 2018-05-10 | Discharge: 2018-05-10 | Disposition: A | Discharge status: Home / Self Care | Payer: Medicare HMO | Source: Ambulatory Visit | Attending: Vascular Surgery | Admitting: Vascular Surgery

## 2018-05-10 DIAGNOSIS — I10 Essential (primary) hypertension: Secondary | ICD-10-CM | POA: Diagnosis not present

## 2018-05-10 DIAGNOSIS — E785 Hyperlipidemia, unspecified: Secondary | ICD-10-CM | POA: Diagnosis not present

## 2018-05-10 DIAGNOSIS — Z7982 Long term (current) use of aspirin: Secondary | ICD-10-CM | POA: Diagnosis not present

## 2018-05-10 DIAGNOSIS — Z7951 Long term (current) use of inhaled steroids: Secondary | ICD-10-CM | POA: Insufficient documentation

## 2018-05-10 DIAGNOSIS — E1151 Type 2 diabetes mellitus with diabetic peripheral angiopathy without gangrene: Secondary | ICD-10-CM | POA: Diagnosis not present

## 2018-05-10 DIAGNOSIS — F418 Other specified anxiety disorders: Secondary | ICD-10-CM | POA: Insufficient documentation

## 2018-05-10 DIAGNOSIS — K219 Gastro-esophageal reflux disease without esophagitis: Secondary | ICD-10-CM | POA: Insufficient documentation

## 2018-05-10 DIAGNOSIS — G4730 Sleep apnea, unspecified: Secondary | ICD-10-CM | POA: Diagnosis not present

## 2018-05-10 DIAGNOSIS — H409 Unspecified glaucoma: Secondary | ICD-10-CM | POA: Insufficient documentation

## 2018-05-10 DIAGNOSIS — N3281 Overactive bladder: Secondary | ICD-10-CM | POA: Diagnosis not present

## 2018-05-10 DIAGNOSIS — Z7984 Long term (current) use of oral hypoglycemic drugs: Secondary | ICD-10-CM | POA: Insufficient documentation

## 2018-05-10 DIAGNOSIS — I70211 Atherosclerosis of native arteries of extremities with intermittent claudication, right leg: Secondary | ICD-10-CM | POA: Diagnosis not present

## 2018-05-10 DIAGNOSIS — R69 Illness, unspecified: Secondary | ICD-10-CM | POA: Diagnosis not present

## 2018-05-10 DIAGNOSIS — M797 Fibromyalgia: Secondary | ICD-10-CM | POA: Diagnosis not present

## 2018-05-10 HISTORY — PX: ABDOMINAL AORTOGRAM W/LOWER EXTREMITY: CATH118223

## 2018-05-10 LAB — POCT I-STAT, CHEM 8
BUN: 15 mg/dL (ref 6–20)
Calcium, Ion: 1.19 mmol/L (ref 1.15–1.40)
Chloride: 106 mmol/L (ref 101–111)
Creatinine, Ser: 0.7 mg/dL (ref 0.44–1.00)
Glucose, Bld: 147 mg/dL — ABNORMAL HIGH (ref 65–99)
HCT: 41 % (ref 36.0–46.0)
Hemoglobin: 13.9 g/dL (ref 12.0–15.0)
Potassium: 3.9 mmol/L (ref 3.5–5.1)
Sodium: 142 mmol/L (ref 135–145)
TCO2: 24 mmol/L (ref 22–32)

## 2018-05-10 LAB — GLUCOSE, CAPILLARY: Glucose-Capillary: 147 mg/dL — ABNORMAL HIGH (ref 65–99)

## 2018-05-10 SURGERY — ABDOMINAL AORTOGRAM W/LOWER EXTREMITY
Anesthesia: LOCAL

## 2018-05-10 MED ORDER — ONDANSETRON HCL 4 MG/2ML IJ SOLN: 4.0000 mg | Freq: Four times a day (QID) | INTRAMUSCULAR | Status: DC | PRN

## 2018-05-10 MED ORDER — SODIUM CHLORIDE 0.9 % IV SOLN
INTRAVENOUS | Status: DC
  Administered 2018-05-10: 08:00:00 via INTRAVENOUS

## 2018-05-10 MED ORDER — FENTANYL CITRATE (PF) 100 MCG/2ML IJ SOLN
INTRAMUSCULAR | Status: AC
  Filled 2018-05-10: qty 2

## 2018-05-10 MED ORDER — OXYCODONE HCL 5 MG PO TABS: 5.0000 mg | ORAL_TABLET | ORAL | Status: DC | PRN

## 2018-05-10 MED ORDER — SODIUM CHLORIDE 0.9% FLUSH: 3.0000 mL | INTRAVENOUS | Status: DC | PRN

## 2018-05-10 MED ORDER — HEPARIN (PORCINE) IN NACL 1000-0.9 UT/500ML-% IV SOLN
INTRAVENOUS | Status: AC
  Filled 2018-05-10: qty 500

## 2018-05-10 MED ORDER — SODIUM CHLORIDE 0.9% FLUSH: 3.0000 mL | Freq: Two times a day (BID) | INTRAVENOUS | Status: DC

## 2018-05-10 MED ORDER — SODIUM CHLORIDE 0.9 % IV SOLN: INTRAVENOUS | Status: DC

## 2018-05-10 MED ORDER — MIDAZOLAM HCL 2 MG/2ML IJ SOLN
INTRAMUSCULAR | Status: DC | PRN
  Administered 2018-05-10: 1 mg via INTRAVENOUS

## 2018-05-10 MED ORDER — LABETALOL HCL 5 MG/ML IV SOLN: 10.0000 mg | INTRAVENOUS | Status: DC | PRN

## 2018-05-10 MED ORDER — MIDAZOLAM HCL 2 MG/2ML IJ SOLN
INTRAMUSCULAR | Status: AC
  Filled 2018-05-10: qty 2

## 2018-05-10 MED ORDER — SODIUM CHLORIDE 0.9 % IV SOLN: 250.0000 mL | INTRAVENOUS | Status: DC | PRN

## 2018-05-10 MED ORDER — HEPARIN (PORCINE) IN NACL 2-0.9 UNITS/ML
INTRAMUSCULAR | Status: AC | PRN
  Administered 2018-05-10: 1000 mL

## 2018-05-10 MED ORDER — IODIXANOL 320 MG/ML IV SOLN
INTRAVENOUS | Status: DC | PRN
  Administered 2018-05-10: 105 mL via INTRA_ARTERIAL

## 2018-05-10 MED ORDER — MORPHINE SULFATE (PF) 10 MG/ML IV SOLN: 2.0000 mg | INTRAVENOUS | Status: DC | PRN

## 2018-05-10 MED ORDER — FENTANYL CITRATE (PF) 100 MCG/2ML IJ SOLN
INTRAMUSCULAR | Status: DC | PRN
  Administered 2018-05-10: 50 ug via INTRAVENOUS

## 2018-05-10 MED ORDER — ACETAMINOPHEN 325 MG PO TABS: 650.0000 mg | ORAL_TABLET | ORAL | Status: DC | PRN

## 2018-05-10 MED ORDER — LIDOCAINE HCL (PF) 1 % IJ SOLN
INTRAMUSCULAR | Status: DC | PRN
  Administered 2018-05-10: 15 mL

## 2018-05-10 MED ORDER — LIDOCAINE HCL (PF) 1 % IJ SOLN
INTRAMUSCULAR | Status: AC
  Filled 2018-05-10: qty 30

## 2018-05-10 MED ORDER — HYDRALAZINE HCL 20 MG/ML IJ SOLN: 5.0000 mg | INTRAMUSCULAR | Status: DC | PRN

## 2018-05-10 SURGICAL SUPPLY — 10 items
CATH ANGIO 5F PIGTAIL 65CM (CATHETERS) ×1 IMPLANT
COVER DOME SNAP 22 D (MISCELLANEOUS) ×1 IMPLANT
COVER PRB 48X5XTLSCP FOLD TPE (BAG) IMPLANT
COVER PROBE 5X48 (BAG) ×2
KIT PV (KITS) ×2 IMPLANT
SHEATH PINNACLE 5F 10CM (SHEATH) ×1 IMPLANT
SYR MEDRAD MARK V 150ML (SYRINGE) ×2 IMPLANT
TRANSDUCER W/STOPCOCK (MISCELLANEOUS) ×2 IMPLANT
TRAY PV CATH (CUSTOM PROCEDURE TRAY) ×2 IMPLANT
WIRE HITORQ VERSACORE ST 145CM (WIRE) ×1 IMPLANT

## 2018-05-10 NOTE — Interval H&P Note (Signed)
History and Physical Interval Note:  05/10/2018 9:25 AM  Jill Higgins  has presented today for surgery, with the diagnosis of pvd  The various methods of treatment have been discussed with the patient and family. After consideration of risks, benefits and other options for treatment, the patient has consented to  Procedure(s): ABDOMINAL AORTOGRAM W/LOWER EXTREMITY (N/A) as a surgical intervention .  The patient's history has been reviewed, patient examined, no change in status, stable for surgery.  I have reviewed the patient's chart and labs.  Questions were answered to the patient's satisfaction.     Ruta Hinds

## 2018-05-10 NOTE — Op Note (Signed)
Procedure: Abdominal aortogram with bilateral lower extremity runoff  Preoperative diagnosis: Claudication right leg  Postoperative diagnosis: Same  Anesthesia: Local with IV sedation  Operative findings: Number 190% stenosis distal external iliac artery adjacent to existing femoropopliteal two-vessel runoff right foot two-vessel runoff left foot  Operative details: After obtaining informed consent patient was taken to the [facility] lab.  The patient was placed in supine position Angio table.  Ultrasound was used to identify the left common femoral artery femoral bifurcation and ended existing femoropopliteal bypass.  A permanent image was obtained to be placed in the medical record.  All of these were patent.  At this point the common femoral artery was cannulated after infiltrating local anesthesia.  This was stuck to just above the femoropopliteal bypass.  An 035 versacore wire was threaded in the abdominal aorta.  5 French sheath was placed over the guidewire.  Contrast Joetta Manners was performed to confirm we were above the femoropopliteal.  At this point a 5 [place] rectal catheter was placed over the guidewire and advanced in the abdominal aorta.  Abdominal aortogram was obtained in AP projection.  This shows the left and right common external and internal iliac arteries are widely patent.  The infrarenal abdominal aorta is patent.  There is a 90% stenosis of the distal external iliac artery on the right side just above the femoropopliteal.  There does not appear to be enough room between the distal external iliac artery and the femoropopliteal bypass that would not potentially compromise the common femoral and profunda.  At this point bilateral lower extremity runoff views were obtained through the pigtail catheter.  In the left lower extremity there is a femoral to above-knee popliteal bypass which is patent.  The profundus patent.  Common femoral artery is patent.  The below-knee popliteal artery is  patent.  There is two-vessel runoff to the right foot.  There are similar findings on the right side.  New para at this point the pigtail catheter was removed over guidewire.  The 5 French sheath was thoroughly flushed with heparinized saline.  Sheath was left in place to be pulled in the holding area.  The patient tolerated procedure well and there were no complications.  Operative management: The patient will be scheduled for right femoral endarterectomy possible distal external iliac artery stent retrograde the first week of July.  Ruta Hinds, MD [facility] Office: [phone] Pager: [phone]

## 2018-05-10 NOTE — Progress Notes (Addendum)
Lt femoral artery access site assessed: level 0, palpable dorsalis pedis pulses. 5French Sheath removed and manual pressure applied for 20 minutes. Pre, peri, & post procedural vitals: HR 50 - 55, RR 15 - 20, O2 Sat upper 90, BP 120/70, Pain 0. Distal pulses remained intact after sheath removal. Access site level 0 and dressed with 4X4 gauze and tegaderm.  Post procedural instructions discussed and return demonstration from patient. Pressure applied by Satira Sark RT - R

## 2018-05-10 NOTE — Discharge Instructions (Signed)

## 2018-05-10 NOTE — Progress Notes (Signed)
Pt is having sneezing episodes post recovery, pressure being held, pt using water to rinse nostrils. Level 0 currently. Continue to monitor,

## 2018-05-10 NOTE — Interval H&P Note (Signed)
History and Physical Interval Note:  05/10/2018 9:27 AM  Jill Higgins  has presented today for surgery, with the diagnosis of pvd  The various methods of treatment have been discussed with the patient and family. After consideration of risks, benefits and other options for treatment, the patient has consented to  Procedure(s): ABDOMINAL AORTOGRAM W/LOWER EXTREMITY (N/A) as a surgical intervention .  The patient's history has been reviewed, patient examined, no change in status, stable for surgery.  I have reviewed the patient's chart and labs.  Questions were answered to the patient's satisfaction.     Ruta Hinds

## 2018-05-13 ENCOUNTER — Encounter (HOSPITAL_COMMUNITY): Payer: Self-pay | Admitting: Vascular Surgery

## 2018-05-14 DIAGNOSIS — H401133 Primary open-angle glaucoma, bilateral, severe stage: Secondary | ICD-10-CM | POA: Diagnosis not present

## 2018-05-17 ENCOUNTER — Encounter (HOSPITAL_COMMUNITY): Payer: Self-pay

## 2018-05-17 ENCOUNTER — Encounter (HOSPITAL_COMMUNITY)
Admission: RE | Admit: 2018-05-17 | Discharge: 2018-05-17 | Disposition: A | Discharge status: Home / Self Care | Payer: Medicare HMO | Source: Ambulatory Visit | Attending: Vascular Surgery | Admitting: Vascular Surgery

## 2018-05-17 ENCOUNTER — Other Ambulatory Visit: Payer: Self-pay

## 2018-05-17 DIAGNOSIS — I10 Essential (primary) hypertension: Secondary | ICD-10-CM | POA: Diagnosis not present

## 2018-05-17 DIAGNOSIS — R69 Illness, unspecified: Secondary | ICD-10-CM | POA: Diagnosis not present

## 2018-05-17 DIAGNOSIS — E785 Hyperlipidemia, unspecified: Secondary | ICD-10-CM | POA: Insufficient documentation

## 2018-05-17 DIAGNOSIS — F329 Major depressive disorder, single episode, unspecified: Secondary | ICD-10-CM | POA: Insufficient documentation

## 2018-05-17 DIAGNOSIS — F419 Anxiety disorder, unspecified: Secondary | ICD-10-CM | POA: Insufficient documentation

## 2018-05-17 DIAGNOSIS — K219 Gastro-esophageal reflux disease without esophagitis: Secondary | ICD-10-CM | POA: Diagnosis not present

## 2018-05-17 DIAGNOSIS — Z01812 Encounter for preprocedural laboratory examination: Secondary | ICD-10-CM | POA: Diagnosis present

## 2018-05-17 DIAGNOSIS — E1159 Type 2 diabetes mellitus with other circulatory complications: Secondary | ICD-10-CM | POA: Diagnosis not present

## 2018-05-17 LAB — BLOOD GAS, ARTERIAL
Acid-base deficit: 2.1 mmol/L — ABNORMAL HIGH (ref 0.0–2.0)
Bicarbonate: 22 mmol/L (ref 20.0–28.0)
Drawn by: 470591
FIO2: 21
O2 Saturation: 97.8 %
Patient temperature: 98.6
pCO2 arterial: 36.6 mmHg (ref 32.0–48.0)
pH, Arterial: 7.396 (ref 7.350–7.450)
pO2, Arterial: 103 mmHg (ref 83.0–108.0)

## 2018-05-17 LAB — APTT: aPTT: 23 seconds — ABNORMAL LOW (ref 24–36)

## 2018-05-17 LAB — URINALYSIS, ROUTINE W REFLEX MICROSCOPIC
Bilirubin Urine: NEGATIVE
Glucose, UA: >=500 mg/dL — AB
Hgb urine dipstick: NEGATIVE
Ketones, ur: NEGATIVE mg/dL
Leukocytes, UA: NEGATIVE
Nitrite: NEGATIVE
Protein, ur: NEGATIVE mg/dL
Specific Gravity, Urine: 1.033 — ABNORMAL HIGH (ref 1.005–1.030)
pH: 5 (ref 5.0–8.0)

## 2018-05-17 LAB — HEMOGLOBIN A1C
Hgb A1c MFr Bld: 8.7 % — ABNORMAL HIGH (ref 4.8–5.6)
Mean Plasma Glucose: 202.99 mg/dL

## 2018-05-17 LAB — COMPREHENSIVE METABOLIC PANEL
ALT: 14 U/L (ref 0–44)
AST: 19 U/L (ref 15–41)
Albumin: 3.8 g/dL (ref 3.5–5.0)
Alkaline Phosphatase: 59 U/L (ref 38–126)
Anion gap: 9 (ref 5–15)
BUN: 15 mg/dL (ref 8–23)
CO2: 20 mmol/L — ABNORMAL LOW (ref 22–32)
Calcium: 9.1 mg/dL (ref 8.9–10.3)
Chloride: 108 mmol/L (ref 98–111)
Creatinine, Ser: 0.88 mg/dL (ref 0.44–1.00)
GFR calc Af Amer: >60 mL/min (ref >60)
GFR calc non Af Amer: >60 mL/min (ref >60)
Glucose, Bld: 189 mg/dL — ABNORMAL HIGH (ref 70–99)
Potassium: 4.1 mmol/L (ref 3.5–5.1)
Sodium: 137 mmol/L (ref 135–145)
Total Bilirubin: 0.7 mg/dL (ref 0.3–1.2)
Total Protein: 6.3 g/dL — ABNORMAL LOW (ref 6.5–8.1)

## 2018-05-17 LAB — TYPE AND SCREEN
ABO/RH(D): A POS
Antibody Screen: NEGATIVE

## 2018-05-17 LAB — CBC
HCT: 40.2 % (ref 36.0–46.0)
Hemoglobin: 12.7 g/dL (ref 12.0–15.0)
MCH: 28.7 pg (ref 26.0–34.0)
MCHC: 31.6 g/dL (ref 30.0–36.0)
MCV: 91 fL (ref 78.0–100.0)
Platelets: 231 10*3/uL (ref 150–400)
RBC: 4.42 MIL/uL (ref 3.87–5.11)
RDW: 15.1 % (ref 11.5–15.5)
WBC: 5.7 10*3/uL (ref 4.0–10.5)

## 2018-05-17 LAB — SURGICAL PCR SCREEN
MRSA, PCR: NEGATIVE
Staphylococcus aureus: NEGATIVE

## 2018-05-17 LAB — GLUCOSE, CAPILLARY: Glucose-Capillary: 174 mg/dL — ABNORMAL HIGH (ref 70–99)

## 2018-05-17 LAB — PROTIME-INR
INR: 0.93
Prothrombin Time: 12.3 seconds (ref 11.4–15.2)

## 2018-05-17 NOTE — Pre-Procedure Instructions (Signed)
AOLANIS CRISPEN  05/17/2018      [place], [address]. [place]. [place] [address] Phone: [phone] Fax: [phone]    Your procedure is scheduled on Monday July 8.  Report to [facility] Admitting at 5:30 A.M.  Call this number if you have problems the morning of surgery:  [phone]   Remember:  Do not eat or drink after midnight.    Take these medicines the morning of surgery with A SIP OF WATER:   Metoprolol (Toprol XL) Ranitidine (zantac) Pantoprazole (Protonix) Amlodipine (norvasc) Gabapentin (neurontin) Robaxin if needed PROAIR inhaler if needed (please bring inhaler to hospital with you)  DO NOT TAKE emagliflozin (Jardiance), Januvia, or metformin (Glucophage) the day of surgery  7 days prior to surgery STOP taking any Aleve, Naproxen, Ibuprofen, Motrin, Advil, Goody's, BC's, all herbal medications, fish oil, and all vitamins      How to Manage Your Diabetes Before and After Surgery  Why is it important to control my blood sugar before and after surgery? . Improving blood sugar levels before and after surgery helps healing and can limit problems. . A way of improving blood sugar control is eating a healthy diet by: o  Eating less sugar and carbohydrates o  Increasing activity/exercise o  Talking with your doctor about reaching your blood sugar goals . High blood sugars (greater than 180 mg/dL) can raise your risk of infections and slow your recovery, so you will need to focus on controlling your diabetes during the weeks before surgery. . Make sure that the doctor who takes care of your diabetes knows about your planned surgery including the date and location.  How do I manage my blood sugar before surgery? . Check your blood sugar at least 4 times a day, starting 2 days before surgery, to make sure that the level is not too high or low. o Check your blood sugar the morning of  your surgery when you wake up and every 2 hours until you get to the Short Stay unit. . If your blood sugar is less than 70 mg/dL, you will need to treat for low blood sugar: o Do not take insulin. o Treat a low blood sugar (less than 70 mg/dL) with  cup of clear juice (cranberry or apple), 4 glucose tablets, OR glucose gel. Recheck blood sugar in 15 minutes after treatment (to make sure it is greater than 70 mg/dL). If your blood sugar is not greater than 70 mg/dL on recheck, call [phone] o  for further instructions. . Report your blood sugar to the short stay nurse when you get to Short Stay.  . If you are admitted to the hospital after surgery: o Your blood sugar will be checked by the staff and you will probably be given insulin after surgery (instead of oral diabetes medicines) to make sure you have good blood sugar levels. o The goal for blood sugar control after surgery is 80-180 mg/dL.                Do not wear jewelry, make-up or nail polish.  Do not wear lotions, powders, or perfumes, or deodorant.  Do not shave 48 hours prior to surgery.  Men may shave face and neck.  Do not bring valuables to the hospital.  [facility] is not responsible for any belongings or valuables.  Contacts, dentures or bridgework may not be worn into surgery.  Leave your  suitcase in the car.  After surgery it may be brought to your room.  For patients admitted to the hospital, discharge time will be determined by your treatment team.  Patients discharged the day of surgery will not be allowed to drive home.   Special instructions:    [facility]- Preparing For Surgery  Before surgery, you can play an important role. Because skin is not sterile, your skin needs to be as free of germs as possible. You can reduce the number of germs on your skin by washing with CHG (chlorahexidine gluconate) Soap before surgery.  CHG is an antiseptic cleaner which kills germs and bonds with the skin to  continue killing germs even after washing.    Oral Hygiene is also important to reduce your risk of infection.  Remember - BRUSH YOUR TEETH THE MORNING OF SURGERY WITH YOUR REGULAR TOOTHPASTE  Please do not use if you have an allergy to CHG or antibacterial soaps. If your skin becomes reddened/irritated stop using the CHG.  Do not shave (including legs and underarms) for at least 48 hours prior to first CHG shower. It is OK to shave your face.  Please follow these instructions carefully.   1. Shower the NIGHT BEFORE SURGERY and the MORNING OF SURGERY with CHG.   2. If you chose to wash your hair, wash your hair first as usual with your normal shampoo.  3. After you shampoo, rinse your hair and body thoroughly to remove the shampoo.  4. Use CHG as you would any other liquid soap. You can apply CHG directly to the skin and wash gently with a scrungie or a clean washcloth.   5. Apply the CHG Soap to your body ONLY FROM THE NECK DOWN.  Do not use on open wounds or open sores. Avoid contact with your eyes, ears, mouth and genitals (private parts). Wash Face and genitals (private parts)  with your normal soap.  6. Wash thoroughly, paying special attention to the area where your surgery will be performed.  7. Thoroughly rinse your body with warm water from the neck down.  8. DO NOT shower/wash with your normal soap after using and rinsing off the CHG Soap.  9. Pat yourself dry with a CLEAN TOWEL.  10. Wear CLEAN PAJAMAS to bed the night before surgery, wear comfortable clothes the morning of surgery  11. Place CLEAN SHEETS on your bed the night of your first shower and DO NOT SLEEP WITH PETS.    Day of Surgery:  Do not apply any deodorants/lotions.  Please wear clean clothes to the hospital/surgery center.   Remember to brush your teeth WITH YOUR REGULAR TOOTHPASTE.    Please read over the following fact sheets that you were given. Coughing and Deep Breathing, MRSA Information and  Surgical Site Infection Prevention

## 2018-05-17 NOTE — Progress Notes (Signed)
PCP - Carol Ada Cardiologist - Dr Stanford Breed Endocrinologist- Dr. Loanne Drilling (lebaur)- pt to go see on Monday July 1  Chest x-ray -  N/A EKG - 05/10/18 Stress Test - 11/24/15  Fasting Blood Sugar - pt states her CBGs have been over 200 recently, and her PCP has changed some of her DM medications. A1c 8.7 today. Pt states she has been drinking a lot of cranberry juice d/t recent gallbladder infection which has resolved.   Anesthesia review: A1c 8.7  Patient denies shortness of breath, fever, cough and chest pain at PAT appointment   Patient verbalized understanding of instructions that were given to them at the PAT appointment. Patient was also instructed that they will need to review over the PAT instructions again at home before surgery.

## 2018-05-20 NOTE — Progress Notes (Signed)
Anesthesia Chart Review:   Case:  [id number] Date/Time:  05/27/18 0715   Procedures:      ENDARTERECTOMY FEMORAL (Right )     INSERTION OF RIGHT EXTERNAL ILIAC STENT (Right )   Anesthesia type:  General   Pre-op diagnosis:  Peripheral Vascular Disease   Location:  [facility] ROOM 10 / [facility] OR   Surgeon:  Elam Dutch, MD      DISCUSSION: - Pt is a 72 year old female with hx HTN, DM, PVD..  - OSA listed in hx but polysomnogram 09/08/16 documents no "signficant obstructive or central sleep disordered breathing"  - Saw endocrinologist Renato Shin, MD for initial visit for DM management 05/21/18.  Repaglinide added to medication regimen.    VS: BP 137/72   Pulse (!) 58   Temp 36.7 C   Resp 18   Ht 5\' 3"  (1.6 m)   Wt 144 lb 11.2 oz (65.6 kg)   SpO2 100%   BMI 25.63 kg/m    PROVIDERS: PCP is Carol Ada, MD   Saw Renato Shin, MD with endocrinology for DM type 2 on 05/21/18  Saw cardiologist Kirk Ruths, MD 11/16/15 for pre-op eval prior to R FPBG 02/08/16.  Stress test ordered, normal results below.  Pt does not routinely see cardiology.    LABS: Labs reviewed: Acceptable for surgery. (all labs ordered are listed, but only abnormal results are displayed)  Labs Reviewed  APTT - Abnormal; Notable for the following components:      Result Value   aPTT 23 (*)    All other components within normal limits  BLOOD GAS, ARTERIAL - Abnormal; Notable for the following components:   Acid-base deficit 2.1 (*)    All other components within normal limits  COMPREHENSIVE METABOLIC PANEL - Abnormal; Notable for the following components:   CO2 20 (*)    Glucose, Bld 189 (*)    Total Protein 6.3 (*)    All other components within normal limits  URINALYSIS, ROUTINE W REFLEX MICROSCOPIC - Abnormal; Notable for the following components:   APPearance HAZY (*)    Specific Gravity, Urine 1.033 (*)    Glucose, UA >=500 (*)    Bacteria, UA RARE (*)    All other components within normal  limits  HEMOGLOBIN A1C - Abnormal; Notable for the following components:   Hgb A1c MFr Bld 8.7 (*)    All other components within normal limits  GLUCOSE, CAPILLARY - Abnormal; Notable for the following components:   Glucose-Capillary 174 (*)    All other components within normal limits  SURGICAL PCR SCREEN  CBC  PROTIME-INR  TYPE AND SCREEN     IMAGES:  CT chest, abd, pelvis 03/09/18:  1. No evidence of traumatic injury to the abdomen or pelvis. 2. Scattered coronary artery calcifications. 3. Minimal blebs at the lung apices.  Minimal bilateral atelectasis.   EKG 05/10/18: Sinus bradycardia (55 bpm). Low voltage QRS   CV:  Nuclear stress test 11/24/15:   Nuclear stress EF: 58%.  The left ventricular ejection fraction is normal (55-65%).  The study is normal.  This is a low risk study. 1. Low risk study. Nl perfusion and EF    Past Medical History:  Diagnosis Date  . Bronchitis   . Bulging disc    in neck  . Depression with anxiety   . Diabetes mellitus    Type II  . Fibromyalgia    pt. denies  . GERD (gastroesophageal reflux disease)   .  Glaucoma   . History of bronchitis   . Hyperlipidemia   . Hypertension   . Overactive bladder   . Peripheral vascular disease ([place])   . Pneumonia   . Sleep apnea    recent test negative for sleep apnea  . Tobacco abuse   . Urinary frequency     Past Surgical History:  Procedure Laterality Date  . ABDOMINAL AORTAGRAM N/A 08/15/2013   Procedure: ABDOMINAL Maxcine Ham;  Surgeon: Elam Dutch, MD;  Location: [facility] CATH LAB;  Service: Cardiovascular;  Laterality: N/A;  . ABDOMINAL AORTAGRAM N/A 06/19/2014   Procedure: ABDOMINAL Maxcine Ham;  Surgeon: Elam Dutch, MD;  Location: [facility] CATH LAB;  Service: Cardiovascular;  Laterality: N/A;  . ABDOMINAL AORTOGRAM W/LOWER EXTREMITY N/A 05/10/2018   Procedure: ABDOMINAL AORTOGRAM W/LOWER EXTREMITY;  Surgeon: Elam Dutch, MD;  Location: [facility] CV LAB;  Service:  Cardiovascular;  Laterality: N/A;  bilateral  . Aortogram w/ PTA  05/14/08,  11-04-10   Bilateral aortogram w/ bilateral  SFA PTA  stenting   . BREAST SURGERY Right    boil removal  . BUNIONECTOMY     L foot in the 1980s  . COLONOSCOPY     2014  . DILATION AND CURETTAGE OF UTERUS     X4  . Epidural shots in neck     . EYE SURGERY Bilateral 2016   cataract removal  . FEMORAL-POPLITEAL BYPASS GRAFT Left 10/21/2013   Procedure: LEFT FEMORAL-POPLITEAL ARTERY BYPASS WITH SAPHENOUS VEIN GRAFT , POPLITEAL ENDARTERECTOMY ,INTRAOPERATIVE ARTERIOGRAM, vein patch angioplasty to popliteal artery;  Surgeon: Elam Dutch, MD;  Location: [facility] OR;  Service: Vascular;  Laterality: Left;  . FEMORAL-POPLITEAL BYPASS GRAFT Right 02/08/2016   Procedure: Right FEMORAL- to Above Knee POPLITEAL ARTERY Bypass Graft with reversed saphenous vein and Common Femoral Endarterectomy  with profundoplasty;  Surgeon: Elam Dutch, MD;  Location: [facility];  Service: Vascular;  Laterality: Right;  . GROIN DEBRIDEMENT Left 10/28/2013   Procedure: left inner thigh DEBRIDEMENT;  Surgeon: Elam Dutch, MD;  Location: [facility];  Service: Vascular;  Laterality: Left;  . HAND SURGERY Left 2010  . LOWER EXTREMITY ANGIOGRAM Bilateral 12/24/2015   Procedure: Lower Extremity Angiogram;  Surgeon: Elam Dutch, MD;  Location: [facility] CV LAB;  Service: Cardiovascular;  Laterality: Bilateral;  . PERIPHERAL VASCULAR CATHETERIZATION N/A 12/24/2015   Procedure: Abdominal Aortogram;  Surgeon: Elam Dutch, MD;  Location: [facility] CV LAB;  Service: Cardiovascular;  Laterality: N/A;  . PERIPHERAL VASCULAR CATHETERIZATION Left 12/24/2015   Procedure: Peripheral Vascular Balloon Angioplasty;  Surgeon: Elam Dutch, MD;  Location: [facility] CV LAB;  Service: Cardiovascular;  Laterality: Left;  drug coated balloon  . PERIPHERAL VASCULAR CATHETERIZATION N/A 06/30/2016   Procedure: Abdominal Aortogram;  Surgeon: Elam Dutch, MD;   Location: [facility] CV LAB;  Service: Cardiovascular;  Laterality: N/A;  . PERIPHERAL VASCULAR CATHETERIZATION Bilateral 06/30/2016   Procedure: Lower Extremity Angiography;  Surgeon: Elam Dutch, MD;  Location: [facility] CV LAB;  Service: Cardiovascular;  Laterality: Bilateral;  . TONSILLECTOMY      MEDICATIONS: . amLODipine (NORVASC) 5 MG tablet  . aspirin EC 81 MG tablet  . bismuth subsalicylate (PEPTO BISMOL) 262 MG/15ML suspension  . dorzolamide-timolol (COSOPT) 22.3-6.8 MG/ML ophthalmic solution  . empagliflozin (JARDIANCE) 10 MG TABS tablet  . gabapentin (NEURONTIN) 300 MG capsule  . JANUVIA 100 MG tablet  . latanoprost (XALATAN) 0.005 % ophthalmic solution  . lovastatin (MEVACOR) 20 MG tablet  .  metFORMIN (GLUCOPHAGE) 1000 MG tablet  . methocarbamol (ROBAXIN) 500 MG tablet  . metoprolol succinate (TOPROL-XL) 100 MG 24 hr tablet  . naproxen (NAPROSYN) 500 MG tablet  . naproxen sodium (ALEVE) 220 MG tablet  . ONE TOUCH ULTRA TEST test strip  . pantoprazole (PROTONIX) 40 MG tablet  . PROAIR HFA 108 (90 BASE) MCG/ACT inhaler  . ranitidine (ZANTAC) 150 MG tablet  . repaglinide (PRANDIN) 0.5 MG tablet  . temazepam (RESTORIL) 30 MG capsule   No current facility-administered medications for this encounter.     If no changes, I anticipate pt can proceed with surgery as scheduled.   Willeen Cass, FNP-BC [facility] Short Stay Surgical [facility]/Anesthesiology Phone: [phone] 05/22/2018 10:38 AM

## 2018-05-21 ENCOUNTER — Encounter: Payer: Self-pay | Admitting: Endocrinology

## 2018-05-21 ENCOUNTER — Ambulatory Visit (INDEPENDENT_AMBULATORY_CARE_PROVIDER_SITE_OTHER): Payer: Medicare HMO | Admitting: Endocrinology

## 2018-05-21 DIAGNOSIS — E1151 Type 2 diabetes mellitus with diabetic peripheral angiopathy without gangrene: Secondary | ICD-10-CM

## 2018-05-21 MED ORDER — REPAGLINIDE 0.5 MG PO TABS: 0.5000 mg | ORAL_TABLET | Freq: Three times a day (TID) | ORAL | 11 refills | Status: DC

## 2018-05-21 NOTE — Patient Instructions (Signed)
good diet and exercise significantly improve the control of your diabetes.  please let me know if you wish to be referred to a dietician.  high blood sugar is very risky to your health.  you should see an eye doctor and dentist every year.  It is very important to get all recommended vaccinations.  Controlling your blood pressure and cholesterol drastically reduces the damage diabetes does to your body.  Those who smoke should quit.  Please discuss these with your doctor.  check your blood sugar once a day.  vary the time of day when you check, between before the 3 meals, and at bedtime.  also check if you have symptoms of your blood sugar being too high or too low.  please keep a record of the readings and bring it to your next appointment here (or you can bring the meter itself).  You can write it on any piece of paper.  please call us sooner if your blood sugar goes below 70, or if you have a lot of readings over 200.   I have sent a prescription to your pharmacy, to add "repaglinide" Please come back for a follow-up appointment in 2 months.  If you can no longer afford the januvia, call us, and we may be able to replace that by increasing the repaglinde.

## 2018-05-21 NOTE — Progress Notes (Signed)
Subjective:    Patient ID: Jill Higgins, female    DOB: March 04, 1946, 72 y.o.   MRN: [id number]  HPI pt is referred by Dr Tamala Julian, for diabetes.  Pt states DM was dx'ed in 2013; she has mild if any neuropathy of the lower extremities, but she has has associated PAD; she has never been on insulin; pt says her diet and exercise are fair; she has never had GDM, pancreatitis, pancreatic surgery, severe hypoglycemia or DKA.  She takes 3 oral meds.  She did not tolerate glipizide (hypoglycemia).  She has having trouble affording the [place].  She says cbg's are in the high-100's Past Medical History:  Diagnosis Date  . Bronchitis   . Bulging disc    in neck  . Depression with anxiety   . Diabetes mellitus    Type II  . Fibromyalgia    pt. denies  . GERD (gastroesophageal reflux disease)   . Glaucoma   . History of bronchitis   . Hyperlipidemia   . Hypertension   . Overactive bladder   . Peripheral vascular disease ([place])   . Pneumonia   . Sleep apnea    recent test negative for sleep apnea  . Tobacco abuse   . Urinary frequency     Past Surgical History:  Procedure Laterality Date  . ABDOMINAL AORTAGRAM N/A 08/15/2013   Procedure: ABDOMINAL Maxcine Ham;  Surgeon: Elam Dutch, MD;  Location: [facility] CATH LAB;  Service: Cardiovascular;  Laterality: N/A;  . ABDOMINAL AORTAGRAM N/A 06/19/2014   Procedure: ABDOMINAL Maxcine Ham;  Surgeon: Elam Dutch, MD;  Location: [facility] CATH LAB;  Service: Cardiovascular;  Laterality: N/A;  . ABDOMINAL AORTOGRAM W/LOWER EXTREMITY N/A 05/10/2018   Procedure: ABDOMINAL AORTOGRAM W/LOWER EXTREMITY;  Surgeon: Elam Dutch, MD;  Location: [place] CV LAB;  Service: Cardiovascular;  Laterality: N/A;  bilateral  . Aortogram w/ PTA  05/14/08,  11-04-10   Bilateral aortogram w/ bilateral  SFA PTA  stenting   . BREAST SURGERY Right    boil removal  . BUNIONECTOMY     L foot in the 1980s  . COLONOSCOPY     2014  . DILATION AND CURETTAGE OF UTERUS     X4    . Epidural shots in neck     . EYE SURGERY Bilateral 2016   cataract removal  . FEMORAL-POPLITEAL BYPASS GRAFT Left 10/21/2013   Procedure: LEFT FEMORAL-POPLITEAL ARTERY BYPASS WITH SAPHENOUS VEIN GRAFT , POPLITEAL ENDARTERECTOMY ,INTRAOPERATIVE ARTERIOGRAM, vein patch angioplasty to popliteal artery;  Surgeon: Elam Dutch, MD;  Location: [facility] OR;  Service: Vascular;  Laterality: Left;  . FEMORAL-POPLITEAL BYPASS GRAFT Right 02/08/2016   Procedure: Right FEMORAL- to Above Knee POPLITEAL ARTERY Bypass Graft with reversed saphenous vein and Common Femoral Endarterectomy  with profundoplasty;  Surgeon: Elam Dutch, MD;  Location: [place];  Service: Vascular;  Laterality: Right;  . GROIN DEBRIDEMENT Left 10/28/2013   Procedure: left inner thigh DEBRIDEMENT;  Surgeon: Elam Dutch, MD;  Location: [place];  Service: Vascular;  Laterality: Left;  . HAND SURGERY Left 2010  . LOWER EXTREMITY ANGIOGRAM Bilateral 12/24/2015   Procedure: Lower Extremity Angiogram;  Surgeon: Elam Dutch, MD;  Location: [place] CV LAB;  Service: Cardiovascular;  Laterality: Bilateral;  . PERIPHERAL VASCULAR CATHETERIZATION N/A 12/24/2015   Procedure: Abdominal Aortogram;  Surgeon: Elam Dutch, MD;  Location: [place] CV LAB;  Service: Cardiovascular;  Laterality: N/A;  . PERIPHERAL VASCULAR CATHETERIZATION Left 12/24/2015   Procedure: Peripheral  Vascular Balloon Angioplasty;  Surgeon: Elam Dutch, MD;  Location: [place] CV LAB;  Service: Cardiovascular;  Laterality: Left;  drug coated balloon  . PERIPHERAL VASCULAR CATHETERIZATION N/A 06/30/2016   Procedure: Abdominal Aortogram;  Surgeon: Elam Dutch, MD;  Location: [place] CV LAB;  Service: Cardiovascular;  Laterality: N/A;  . PERIPHERAL VASCULAR CATHETERIZATION Bilateral 06/30/2016   Procedure: Lower Extremity Angiography;  Surgeon: Elam Dutch, MD;  Location: [place] CV LAB;  Service: Cardiovascular;  Laterality: Bilateral;  .  TONSILLECTOMY      Social History   Socioeconomic History  . Marital status: Single    Spouse name: Not on file  . Number of children: 1  . Years of education: Not on file  . Highest education level: Not on file  Occupational History  . Occupation: Retired    Fish farm manager: Chief of Staff  . Financial resource strain: Not on file  . Food insecurity:    Worry: Not on file    Inability: Not on file  . Transportation needs:    Medical: Not on file    Non-medical: Not on file  Tobacco Use  . Smoking status: Former Smoker    Packs/day: 0.10    Types: Cigarettes    Last attempt to quit: 10/2014    Years since quitting: 3.5  . Smokeless tobacco: Never Used  . Tobacco comment: pt states that she is using the E cig only   Substance and Sexual Activity  . Alcohol use: Yes    Alcohol/week: 0.0 - 0.6 oz    Comment: Occasional  . Drug use: No  . Sexual activity: Not on file  Lifestyle  . Physical activity:    Days per week: Not on file    Minutes per session: Not on file  . Stress: Not on file  Relationships  . Social connections:    Talks on phone: Not on file    Gets together: Not on file    Attends religious service: Not on file    Active member of club or organization: Not on file    Attends meetings of clubs or organizations: Not on file    Relationship status: Not on file  . Intimate partner violence:    Fear of current or ex partner: Not on file    Emotionally abused: Not on file    Physically abused: Not on file    Forced sexual activity: Not on file  Other Topics Concern  . Not on file  Social History Narrative   Drinks about 2 cups of coffee a day, and some tea     Current Outpatient Medications on File Prior to Visit  Medication Sig Dispense Refill  . amLODipine (NORVASC) 5 MG tablet Take 5 mg by mouth daily.     Marland Kitchen aspirin EC 81 MG tablet Take 81 mg by mouth at bedtime.     . bismuth subsalicylate (PEPTO BISMOL) 262 MG/15ML suspension Take 30 mLs by mouth  every 6 (six) hours as needed for indigestion or diarrhea or loose stools. Reported on 11/04/2015    . dorzolamide-timolol (COSOPT) 22.3-6.8 MG/ML ophthalmic solution Place 1 drop into both eyes 2 (two) times daily.  0  . empagliflozin (JARDIANCE) 10 MG TABS tablet Take 10 mg by mouth daily.    Marland Kitchen gabapentin (NEURONTIN) 300 MG capsule Take 1 capsule (300 mg total) by mouth 3 (three) times daily. 90 capsule 1  . JANUVIA 100 MG tablet Take 100 mg by mouth daily.     Marland Kitchen  latanoprost (XALATAN) 0.005 % ophthalmic solution Place 1 drop into both eyes at bedtime.    . lovastatin (MEVACOR) 20 MG tablet Take 20 mg by mouth every evening.     . metFORMIN (GLUCOPHAGE) 1000 MG tablet Take 1,000 mg by mouth 2 (two) times daily with a meal.    . methocarbamol (ROBAXIN) 500 MG tablet Take 1 tablet (500 mg total) by mouth 2 (two) times daily. 20 tablet 0  . metoprolol succinate (TOPROL-XL) 100 MG 24 hr tablet Take 100 mg by mouth daily.     . naproxen (NAPROSYN) 500 MG tablet Take 500 mg by mouth 2 (two) times daily as needed. Pain.  6  . naproxen sodium (ALEVE) 220 MG tablet Take 440 mg by mouth daily as needed (for pain.).    Marland Kitchen ONE TOUCH ULTRA TEST test strip USE TO CHECK BLOOD SUGAR 1-2 TIMES DAILY AS DIRECTED  2  . pantoprazole (PROTONIX) 40 MG tablet Take 40 mg by mouth daily.    Marland Kitchen PROAIR HFA 108 (90 BASE) MCG/ACT inhaler Inhale 2 puffs into the lungs every 4 (four) hours as needed for wheezing or shortness of breath. Reported on 11/04/2015    . ranitidine (ZANTAC) 150 MG tablet Take 150 mg by mouth daily.    . temazepam (RESTORIL) 30 MG capsule Take 30 mg by mouth at bedtime as needed for sleep.  0   No current facility-administered medications on file prior to visit.     Allergies  Allergen Reactions  . Adhesive [Tape] Other (See Comments)    Pt states that tape and electrodes leave red "scars" on her skin and her skin is very sensitive.    Family History  Problem Relation Age of Onset  . Heart  disease Father   . Heart attack Father        MI at age 19  . Hypertension Mother   . Alzheimer's disease Mother   . Hypertension Sister   . Diabetes Brother   . Hyperlipidemia Brother   . Hypertension Brother   . Heart disease Brother   . Heart attack Brother     BP 124/72 (BP Location: Left Arm, Patient Position: Sitting, Cuff Size: Normal)   Pulse (!) 56   Ht 5\' 4"  (1.626 m)   Wt 144 lb 9.6 oz (65.6 kg)   SpO2 95%   BMI 24.82 kg/m     Review of Systems denies weight loss, blurry vision, headache, chest pain, sob, n/v, muscle cramps, dry skin, memory loss, depression, cold intolerance,and  rhinorrhea. She has easy bruising and frequent urination.        Objective:   Physical Exam VS: see vs page GEN: no distress HEAD: head: no deformity eyes: no periorbital swelling, no proptosis.   external nose and ears are normal mouth: no lesion seen NECK: supple, thyroid is not enlarged CHEST WALL: no deformity LUNGS: clear to auscultation CV: reg rate and rhythm, no murmur.   ABD: abdomen is soft, nontender.  no hepatosplenomegaly.  not distended.  no hernia MUSCULOSKELETAL: muscle bulk and strength are grossly normal.  no obvious joint swelling.  gait is normal and steady EXTEMITIES: no deformity.  no ulcer on the feet.  feet are of normal color and temp.  no edema PULSES: dorsalis pedis intact bilat.  no carotid bruit.  NEURO:  cn 2-12 grossly intact.   readily moves all 4's.  sensation is intact to touch on the feet.  SKIN:  Normal texture and temperature.  No rash  or suspicious lesion is visible.   NODES:  None palpable at the neck.   PSYCH: alert, well-oriented.  Does not appear anxious nor depressed.     Lab Results  Component Value Date   HGBA1C 8.7 (H) 05/17/2018   Lab Results  Component Value Date   CREATININE 0.88 05/17/2018   BUN 15 05/17/2018   NA 137 05/17/2018   K 4.1 05/17/2018   CL 108 05/17/2018   CO2 20 (L) 05/17/2018   I have reviewed outside  records, and summarized: Pt was noted to have elevated a1c, and referred here.  She has had multiple admissions for successful vascular procedures.       Assessment & Plan:  Type 2 DM, with PAD, new to me: he needs increased rx   Patient Instructions  good diet and exercise significantly improve the control of your diabetes.  please let me know if you wish to be referred to a dietician.  high blood sugar is very risky to your health.  you should see an eye doctor and dentist every year.  It is very important to get all recommended vaccinations.  Controlling your blood pressure and cholesterol drastically reduces the damage diabetes does to your body.  Those who smoke should quit.  Please discuss these with your doctor.  check your blood sugar once a day.  vary the time of day when you check, between before the 3 meals, and at bedtime.  also check if you have symptoms of your blood sugar being too high or too low.  please keep a record of the readings and bring it to your next appointment here (or you can bring the meter itself).  You can write it on any piece of paper.  please call us sooner if your blood sugar goes below 70, or if you have a lot of readings over 200.   I have sent a prescription to your pharmacy, to add "repaglinide" Please come back for a follow-up appointment in 2 months.  If you can no longer afford the januvia, call us, and we may be able to replace that by increasing the repaglinde.

## 2018-05-22 NOTE — Progress Notes (Signed)
Willeen Cass, NP requested that I call pt and instruct her to take or not take an new medication that pt states was added on since her PAT appt. Prandin was added. I called pt and left instructions for pt not to take Prandin the morning of surgery on her voicemail.

## 2018-05-23 DIAGNOSIS — E119 Type 2 diabetes mellitus without complications: Secondary | ICD-10-CM | POA: Insufficient documentation

## 2018-05-26 NOTE — Anesthesia Preprocedure Evaluation (Addendum)
Anesthesia Evaluation    Reviewed: Allergy & Precautions, H&P , Patient's Chart, lab work & pertinent test results, reviewed documented beta blocker date and time   Airway Mallampati: III  TM Distance: >3 FB Neck ROM: Full    Dental  (+) Chipped,    Pulmonary former smoker,    Pulmonary exam normal breath sounds clear to auscultation       Cardiovascular Exercise Tolerance: Good hypertension, Pt. on medications and Pt. on home beta blockers + Peripheral Vascular Disease  Normal cardiovascular exam Rhythm:Regular Rate:Normal  ECG: SB, rate 55  Saw cardiologist Kirk Ruths, MD 11/16/15 for pre-op eval prior to R FPBG 02/08/16.  Stress test ordered, normal results below.  Pt does not routinely see cardiology.   2017 Nuclear stress EF: 58%. The left ventricular ejection fraction is normal (55-65%). The study is normal. This is a low risk study.     Neuro/Psych Anxiety negative neurological ROS     GI/Hepatic Neg liver ROS, GERD  Medicated and Controlled,  Endo/Other  diabetes, Type 2, Oral Hypoglycemic Agents  Renal/GU negative Renal ROS     Musculoskeletal  (+) Fibromyalgia -  Abdominal   Peds  Hematology HLD    Anesthesia Other Findings Peripheral Vascular Disease  Reproductive/Obstetrics                            Anesthesia Physical Anesthesia Plan  ASA: III  Anesthesia Plan: General   Post-op Pain Management:    Induction: Intravenous  PONV Risk Score and Plan: 3 and Ondansetron, Treatment may vary due to age or medical condition and Dexamethasone  Airway Management Planned: Oral ETT  Additional Equipment:   Intra-op Plan:   Post-operative Plan: Extubation in OR  Informed Consent: I have reviewed the patients History and Physical, chart, labs and discussed the procedure including the risks, benefits and alternatives for the proposed anesthesia with the patient or  authorized representative who has indicated his/her understanding and acceptance.   Dental advisory given  Plan Discussed with: CRNA  Anesthesia Plan Comments:        Anesthesia Quick Evaluation

## 2018-05-28 ENCOUNTER — Ambulatory Visit: Payer: Medicare HMO

## 2018-05-29 DIAGNOSIS — H401133 Primary open-angle glaucoma, bilateral, severe stage: Secondary | ICD-10-CM | POA: Diagnosis not present

## 2018-05-29 DIAGNOSIS — H16103 Unspecified superficial keratitis, bilateral: Secondary | ICD-10-CM | POA: Diagnosis not present

## 2018-05-30 ENCOUNTER — Encounter (HOSPITAL_COMMUNITY): Payer: Self-pay | Admitting: *Deleted

## 2018-06-03 ENCOUNTER — Other Ambulatory Visit: Payer: Self-pay

## 2018-06-03 ENCOUNTER — Encounter (HOSPITAL_COMMUNITY)
Admission: RE | Disposition: A | Discharge status: Home / Self Care | Payer: Self-pay | Source: Home / Self Care | Attending: Vascular Surgery

## 2018-06-03 ENCOUNTER — Inpatient Hospital Stay (HOSPITAL_COMMUNITY): Payer: Medicare HMO | Admitting: Emergency Medicine

## 2018-06-03 ENCOUNTER — Inpatient Hospital Stay (HOSPITAL_COMMUNITY): Payer: Medicare HMO | Admitting: Certified Registered Nurse Anesthetist

## 2018-06-03 ENCOUNTER — Encounter (HOSPITAL_COMMUNITY): Payer: Self-pay | Admitting: *Deleted

## 2018-06-03 ENCOUNTER — Inpatient Hospital Stay (HOSPITAL_COMMUNITY)
Admission: RE | Admit: 2018-06-03 | Discharge: 2018-06-07 | DRG: 272 | Disposition: A | Discharge status: Home / Self Care | Payer: Medicare HMO | Attending: Vascular Surgery | Admitting: Vascular Surgery

## 2018-06-03 DIAGNOSIS — Z91048 Other nonmedicinal substance allergy status: Secondary | ICD-10-CM

## 2018-06-03 DIAGNOSIS — I82411 Acute embolism and thrombosis of right femoral vein: Secondary | ICD-10-CM | POA: Diagnosis not present

## 2018-06-03 DIAGNOSIS — K219 Gastro-esophageal reflux disease without esophagitis: Secondary | ICD-10-CM | POA: Diagnosis present

## 2018-06-03 DIAGNOSIS — Z79899 Other long term (current) drug therapy: Secondary | ICD-10-CM | POA: Diagnosis not present

## 2018-06-03 DIAGNOSIS — M79609 Pain in unspecified limb: Secondary | ICD-10-CM | POA: Diagnosis not present

## 2018-06-03 DIAGNOSIS — I10 Essential (primary) hypertension: Secondary | ICD-10-CM | POA: Diagnosis not present

## 2018-06-03 DIAGNOSIS — R059 Cough, unspecified: Secondary | ICD-10-CM

## 2018-06-03 DIAGNOSIS — R1010 Upper abdominal pain, unspecified: Secondary | ICD-10-CM | POA: Diagnosis not present

## 2018-06-03 DIAGNOSIS — Z7982 Long term (current) use of aspirin: Secondary | ICD-10-CM

## 2018-06-03 DIAGNOSIS — I82431 Acute embolism and thrombosis of right popliteal vein: Secondary | ICD-10-CM | POA: Diagnosis not present

## 2018-06-03 DIAGNOSIS — R05 Cough: Secondary | ICD-10-CM

## 2018-06-03 DIAGNOSIS — M797 Fibromyalgia: Secondary | ICD-10-CM | POA: Diagnosis not present

## 2018-06-03 DIAGNOSIS — E785 Hyperlipidemia, unspecified: Secondary | ICD-10-CM | POA: Diagnosis present

## 2018-06-03 DIAGNOSIS — I708 Atherosclerosis of other arteries: Secondary | ICD-10-CM | POA: Diagnosis not present

## 2018-06-03 DIAGNOSIS — E119 Type 2 diabetes mellitus without complications: Secondary | ICD-10-CM | POA: Diagnosis not present

## 2018-06-03 DIAGNOSIS — I878 Other specified disorders of veins: Secondary | ICD-10-CM | POA: Diagnosis not present

## 2018-06-03 DIAGNOSIS — Z95820 Peripheral vascular angioplasty status with implants and grafts: Secondary | ICD-10-CM | POA: Diagnosis not present

## 2018-06-03 DIAGNOSIS — R0602 Shortness of breath: Secondary | ICD-10-CM | POA: Diagnosis not present

## 2018-06-03 DIAGNOSIS — Z7984 Long term (current) use of oral hypoglycemic drugs: Secondary | ICD-10-CM

## 2018-06-03 DIAGNOSIS — R109 Unspecified abdominal pain: Secondary | ICD-10-CM

## 2018-06-03 DIAGNOSIS — E1151 Type 2 diabetes mellitus with diabetic peripheral angiopathy without gangrene: Principal | ICD-10-CM | POA: Diagnosis present

## 2018-06-03 DIAGNOSIS — I739 Peripheral vascular disease, unspecified: Secondary | ICD-10-CM | POA: Diagnosis present

## 2018-06-03 DIAGNOSIS — I70211 Atherosclerosis of native arteries of extremities with intermittent claudication, right leg: Secondary | ICD-10-CM | POA: Diagnosis not present

## 2018-06-03 DIAGNOSIS — Z7901 Long term (current) use of anticoagulants: Secondary | ICD-10-CM | POA: Diagnosis not present

## 2018-06-03 DIAGNOSIS — H409 Unspecified glaucoma: Secondary | ICD-10-CM | POA: Diagnosis not present

## 2018-06-03 HISTORY — PX: PATCH ANGIOPLASTY: SHX6230

## 2018-06-03 HISTORY — PX: ENDARTERECTOMY FEMORAL: SHX5804

## 2018-06-03 LAB — GLUCOSE, CAPILLARY
Glucose-Capillary: 154 mg/dL — ABNORMAL HIGH (ref 70–99)
Glucose-Capillary: 168 mg/dL — ABNORMAL HIGH (ref 70–99)
Glucose-Capillary: 187 mg/dL — ABNORMAL HIGH (ref 70–99)
Glucose-Capillary: 368 mg/dL — ABNORMAL HIGH (ref 70–99)

## 2018-06-03 LAB — POCT I-STAT 4, (NA,K, GLUC, HGB,HCT)
Glucose, Bld: 170 mg/dL — ABNORMAL HIGH (ref 70–99)
HCT: 30 % — ABNORMAL LOW (ref 36.0–46.0)
Hemoglobin: 10.2 g/dL — ABNORMAL LOW (ref 12.0–15.0)
Potassium: 3.5 mmol/L (ref 3.5–5.1)
Sodium: 141 mmol/L (ref 135–145)

## 2018-06-03 LAB — CREATININE, SERUM
Creatinine, Ser: 0.78 mg/dL (ref 0.44–1.00)
GFR calc Af Amer: >60 mL/min (ref >60)
GFR calc non Af Amer: >60 mL/min (ref >60)

## 2018-06-03 LAB — CBC
HCT: 32.8 % — ABNORMAL LOW (ref 36.0–46.0)
Hemoglobin: 10.4 g/dL — ABNORMAL LOW (ref 12.0–15.0)
MCH: 28.9 pg (ref 26.0–34.0)
MCHC: 31.7 g/dL (ref 30.0–36.0)
MCV: 91.1 fL (ref 78.0–100.0)
Platelets: 185 10*3/uL (ref 150–400)
RBC: 3.6 MIL/uL — ABNORMAL LOW (ref 3.87–5.11)
RDW: 14.5 % (ref 11.5–15.5)
WBC: 6.2 10*3/uL (ref 4.0–10.5)

## 2018-06-03 LAB — TYPE AND SCREEN
ABO/RH(D): A POS
Antibody Screen: NEGATIVE

## 2018-06-03 SURGERY — ENDARTERECTOMY, FEMORAL
Anesthesia: General | Site: Groin | Laterality: Right

## 2018-06-03 MED ORDER — HYDROMORPHONE HCL 1 MG/ML IJ SOLN
0.2500 mg | INTRAMUSCULAR | Status: DC | PRN
  Administered 2018-06-03 (×2): 0.5 mg via INTRAVENOUS

## 2018-06-03 MED ORDER — LABETALOL HCL 5 MG/ML IV SOLN: 10.0000 mg | INTRAVENOUS | Status: DC | PRN

## 2018-06-03 MED ORDER — SODIUM CHLORIDE 0.9 % IV SOLN: 500.0000 mL | Freq: Once | INTRAVENOUS | Status: DC | PRN

## 2018-06-03 MED ORDER — SODIUM CHLORIDE 0.9 % IV SOLN
INTRAVENOUS | Status: DC
  Administered 2018-06-03: 10:00:00 via INTRAVENOUS

## 2018-06-03 MED ORDER — LACTATED RINGERS IV SOLN
INTRAVENOUS | Status: DC | PRN
  Administered 2018-06-03: 11:00:00 via INTRAVENOUS

## 2018-06-03 MED ORDER — MORPHINE SULFATE (PF) 4 MG/ML IV SOLN: 4.0000 mg | INTRAVENOUS | Status: DC | PRN

## 2018-06-03 MED ORDER — CEFAZOLIN SODIUM-DEXTROSE 2-4 GM/100ML-% IV SOLN
2.0000 g | INTRAVENOUS | Status: AC
  Administered 2018-06-03: 2 g via INTRAVENOUS
  Filled 2018-06-03: qty 100

## 2018-06-03 MED ORDER — AMLODIPINE BESYLATE 5 MG PO TABS
5.0000 mg | ORAL_TABLET | Freq: Every day | ORAL | Status: DC
  Administered 2018-06-04 – 2018-06-07 (×4): 5 mg via ORAL
  Filled 2018-06-03 (×4): qty 1

## 2018-06-03 MED ORDER — CHLORHEXIDINE GLUCONATE CLOTH 2 % EX PADS: 6.0000 | MEDICATED_PAD | Freq: Once | CUTANEOUS | Status: DC

## 2018-06-03 MED ORDER — DORZOLAMIDE HCL-TIMOLOL MAL 2-0.5 % OP SOLN
1.0000 [drp] | Freq: Two times a day (BID) | OPHTHALMIC | Status: DC
  Administered 2018-06-03 – 2018-06-07 (×6): 1 [drp] via OPHTHALMIC
  Filled 2018-06-03: qty 10

## 2018-06-03 MED ORDER — ALUM & MAG HYDROXIDE-SIMETH 200-200-20 MG/5ML PO SUSP
15.0000 mL | ORAL | Status: DC | PRN
  Filled 2018-06-03: qty 30

## 2018-06-03 MED ORDER — ALBUMIN HUMAN 5 % IV SOLN
INTRAVENOUS | Status: DC | PRN
  Administered 2018-06-03 (×2): via INTRAVENOUS

## 2018-06-03 MED ORDER — ROCURONIUM BROMIDE 10 MG/ML (PF) SYRINGE
PREFILLED_SYRINGE | INTRAVENOUS | Status: AC
  Filled 2018-06-03: qty 10

## 2018-06-03 MED ORDER — OXYCODONE HCL 5 MG/5ML PO SOLN: 5.0000 mg | Freq: Once | ORAL | Status: AC | PRN

## 2018-06-03 MED ORDER — EPHEDRINE SULFATE 50 MG/ML IJ SOLN
INTRAMUSCULAR | Status: AC
  Filled 2018-06-03: qty 1

## 2018-06-03 MED ORDER — PRAVASTATIN SODIUM 20 MG PO TABS
10.0000 mg | ORAL_TABLET | Freq: Every day | ORAL | Status: DC
  Administered 2018-06-03 – 2018-06-06 (×4): 10 mg via ORAL
  Filled 2018-06-03 (×4): qty 1

## 2018-06-03 MED ORDER — ASPIRIN EC 81 MG PO TBEC
81.0000 mg | DELAYED_RELEASE_TABLET | Freq: Every day | ORAL | Status: DC
  Administered 2018-06-03 – 2018-06-06 (×4): 81 mg via ORAL
  Filled 2018-06-03 (×4): qty 1

## 2018-06-03 MED ORDER — HEPARIN SODIUM (PORCINE) 1000 UNIT/ML IJ SOLN
INTRAMUSCULAR | Status: AC
  Filled 2018-06-03: qty 1

## 2018-06-03 MED ORDER — CEFAZOLIN SODIUM 1 G IJ SOLR
INTRAMUSCULAR | Status: AC
  Filled 2018-06-03: qty 20

## 2018-06-03 MED ORDER — PHENOL 1.4 % MT LIQD: 1.0000 | OROMUCOSAL | Status: DC | PRN

## 2018-06-03 MED ORDER — PROPOFOL 10 MG/ML IV BOLUS
INTRAVENOUS | Status: AC
  Filled 2018-06-03: qty 20

## 2018-06-03 MED ORDER — LIDOCAINE HCL (CARDIAC) PF 100 MG/5ML IV SOSY
PREFILLED_SYRINGE | INTRAVENOUS | Status: DC | PRN
  Administered 2018-06-03: 60 mg via INTRAVENOUS

## 2018-06-03 MED ORDER — HEPARIN SODIUM (PORCINE) 5000 UNIT/ML IJ SOLN
5000.0000 [IU] | Freq: Three times a day (TID) | INTRAMUSCULAR | Status: DC
  Administered 2018-06-03 – 2018-06-05 (×5): 5000 [IU] via SUBCUTANEOUS
  Filled 2018-06-03 (×5): qty 1

## 2018-06-03 MED ORDER — PROMETHAZINE HCL 25 MG/ML IJ SOLN: 6.2500 mg | INTRAMUSCULAR | Status: DC | PRN

## 2018-06-03 MED ORDER — DEXAMETHASONE SODIUM PHOSPHATE 10 MG/ML IJ SOLN
INTRAMUSCULAR | Status: AC
  Filled 2018-06-03: qty 1

## 2018-06-03 MED ORDER — PROPOFOL 10 MG/ML IV BOLUS
INTRAVENOUS | Status: DC | PRN
  Administered 2018-06-03: 150 mg via INTRAVENOUS

## 2018-06-03 MED ORDER — CEFAZOLIN SODIUM-DEXTROSE 2-4 GM/100ML-% IV SOLN
2.0000 g | Freq: Three times a day (TID) | INTRAVENOUS | Status: AC
  Administered 2018-06-03 – 2018-06-04 (×2): 2 g via INTRAVENOUS
  Filled 2018-06-03 (×2): qty 100

## 2018-06-03 MED ORDER — SODIUM CHLORIDE 0.9 % IV SOLN
INTRAVENOUS | Status: DC | PRN
  Administered 2018-06-03: 10 ug/min via INTRAVENOUS

## 2018-06-03 MED ORDER — SODIUM CHLORIDE 0.9 % IV SOLN
INTRAVENOUS | Status: AC
  Filled 2018-06-03: qty 1.2

## 2018-06-03 MED ORDER — PROTAMINE SULFATE 10 MG/ML IV SOLN
INTRAVENOUS | Status: DC | PRN
  Administered 2018-06-03: 20 mg via INTRAVENOUS
  Administered 2018-06-03: 5 mg via INTRAVENOUS
  Administered 2018-06-03: 10 mg via INTRAVENOUS
  Administered 2018-06-03: 20 mg via INTRAVENOUS
  Administered 2018-06-03: 15 mg via INTRAVENOUS

## 2018-06-03 MED ORDER — SODIUM CHLORIDE 0.9 % IV SOLN
INTRAVENOUS | Status: DC | PRN
  Administered 2018-06-03: 13:00:00

## 2018-06-03 MED ORDER — HEPARIN SODIUM (PORCINE) 1000 UNIT/ML IJ SOLN
INTRAMUSCULAR | Status: DC | PRN
  Administered 2018-06-03: 7000 [IU] via INTRAVENOUS

## 2018-06-03 MED ORDER — GABAPENTIN 300 MG PO CAPS
300.0000 mg | ORAL_CAPSULE | Freq: Three times a day (TID) | ORAL | Status: DC
  Administered 2018-06-03 – 2018-06-07 (×12): 300 mg via ORAL
  Filled 2018-06-03 (×12): qty 1

## 2018-06-03 MED ORDER — HYDROMORPHONE HCL 1 MG/ML IJ SOLN
INTRAMUSCULAR | Status: AC
  Filled 2018-06-03: qty 1

## 2018-06-03 MED ORDER — 0.9 % SODIUM CHLORIDE (POUR BTL) OPTIME
TOPICAL | Status: DC | PRN
  Administered 2018-06-03: 3000 mL

## 2018-06-03 MED ORDER — DOCUSATE SODIUM 100 MG PO CAPS
100.0000 mg | ORAL_CAPSULE | Freq: Every day | ORAL | Status: DC
  Administered 2018-06-04 – 2018-06-07 (×4): 100 mg via ORAL
  Filled 2018-06-03 (×4): qty 1

## 2018-06-03 MED ORDER — INSULIN ASPART 100 UNIT/ML ~~LOC~~ SOLN
0.0000 [IU] | Freq: Three times a day (TID) | SUBCUTANEOUS | Status: DC
  Administered 2018-06-04 (×2): 5 [IU] via SUBCUTANEOUS
  Administered 2018-06-04: 8 [IU] via SUBCUTANEOUS
  Administered 2018-06-05: 11 [IU] via SUBCUTANEOUS
  Administered 2018-06-05: 8 [IU] via SUBCUTANEOUS
  Administered 2018-06-06: 3 [IU] via SUBCUTANEOUS
  Administered 2018-06-06: 2 [IU] via SUBCUTANEOUS
  Administered 2018-06-06: 3 [IU] via SUBCUTANEOUS
  Administered 2018-06-07: 2 [IU] via SUBCUTANEOUS

## 2018-06-03 MED ORDER — LIDOCAINE 2% (20 MG/ML) 5 ML SYRINGE
INTRAMUSCULAR | Status: AC
  Filled 2018-06-03: qty 5

## 2018-06-03 MED ORDER — GUAIFENESIN-DM 100-10 MG/5ML PO SYRP
15.0000 mL | ORAL_SOLUTION | ORAL | Status: DC | PRN
  Administered 2018-06-05: 15 mL via ORAL
  Filled 2018-06-03: qty 15

## 2018-06-03 MED ORDER — OXYCODONE HCL 5 MG PO TABS
ORAL_TABLET | ORAL | Status: AC
  Filled 2018-06-03: qty 1

## 2018-06-03 MED ORDER — FAMOTIDINE 20 MG PO TABS
10.0000 mg | ORAL_TABLET | Freq: Every day | ORAL | Status: DC
  Administered 2018-06-04 – 2018-06-07 (×4): 10 mg via ORAL
  Filled 2018-06-03 (×4): qty 1

## 2018-06-03 MED ORDER — PHENYLEPHRINE HCL 10 MG/ML IJ SOLN
INTRAMUSCULAR | Status: AC
  Filled 2018-06-03: qty 1

## 2018-06-03 MED ORDER — LACTATED RINGERS IV SOLN
INTRAVENOUS | Status: DC | PRN
  Administered 2018-06-03: 13:00:00 via INTRAVENOUS

## 2018-06-03 MED ORDER — ROCURONIUM BROMIDE 100 MG/10ML IV SOLN
INTRAVENOUS | Status: DC | PRN
  Administered 2018-06-03: 20 mg via INTRAVENOUS
  Administered 2018-06-03: 40 mg via INTRAVENOUS
  Administered 2018-06-03 (×2): 20 mg via INTRAVENOUS

## 2018-06-03 MED ORDER — PHENYLEPHRINE 40 MCG/ML (10ML) SYRINGE FOR IV PUSH (FOR BLOOD PRESSURE SUPPORT)
PREFILLED_SYRINGE | INTRAVENOUS | Status: AC
  Filled 2018-06-03: qty 10

## 2018-06-03 MED ORDER — SUGAMMADEX SODIUM 200 MG/2ML IV SOLN
INTRAVENOUS | Status: DC | PRN
  Administered 2018-06-03: 200 mg via INTRAVENOUS

## 2018-06-03 MED ORDER — POTASSIUM CHLORIDE CRYS ER 20 MEQ PO TBCR: 20.0000 meq | EXTENDED_RELEASE_TABLET | Freq: Every day | ORAL | Status: DC | PRN

## 2018-06-03 MED ORDER — ACETAMINOPHEN 325 MG PO TABS
325.0000 mg | ORAL_TABLET | ORAL | Status: DC | PRN
  Filled 2018-06-03: qty 2

## 2018-06-03 MED ORDER — PROTAMINE SULFATE 10 MG/ML IV SOLN
INTRAVENOUS | Status: AC
  Filled 2018-06-03: qty 5

## 2018-06-03 MED ORDER — OXYCODONE-ACETAMINOPHEN 5-325 MG PO TABS
1.0000 | ORAL_TABLET | ORAL | Status: DC | PRN
  Administered 2018-06-03: 1 via ORAL
  Administered 2018-06-04 – 2018-06-06 (×8): 2 via ORAL
  Filled 2018-06-03: qty 1
  Filled 2018-06-03: qty 2
  Filled 2018-06-03: qty 1
  Filled 2018-06-03 (×4): qty 2
  Filled 2018-06-03: qty 1
  Filled 2018-06-03: qty 2

## 2018-06-03 MED ORDER — PANTOPRAZOLE SODIUM 40 MG PO TBEC
40.0000 mg | DELAYED_RELEASE_TABLET | Freq: Every day | ORAL | Status: DC
  Administered 2018-06-04 – 2018-06-07 (×4): 40 mg via ORAL
  Filled 2018-06-03 (×5): qty 1

## 2018-06-03 MED ORDER — METOPROLOL SUCCINATE ER 100 MG PO TB24
100.0000 mg | ORAL_TABLET | Freq: Every day | ORAL | Status: DC
  Administered 2018-06-04 – 2018-06-07 (×4): 100 mg via ORAL
  Filled 2018-06-03 (×4): qty 1

## 2018-06-03 MED ORDER — ONDANSETRON HCL 4 MG/2ML IJ SOLN
INTRAMUSCULAR | Status: AC
  Filled 2018-06-03: qty 2

## 2018-06-03 MED ORDER — TEMAZEPAM 7.5 MG PO CAPS
30.0000 mg | ORAL_CAPSULE | Freq: Every evening | ORAL | Status: DC | PRN
  Administered 2018-06-03 – 2018-06-06 (×4): 30 mg via ORAL
  Filled 2018-06-03 (×4): qty 4

## 2018-06-03 MED ORDER — SODIUM CHLORIDE 0.9 % IV SOLN
INTRAVENOUS | Status: DC
  Administered 2018-06-03 – 2018-06-06 (×2): via INTRAVENOUS

## 2018-06-03 MED ORDER — HYDRALAZINE HCL 20 MG/ML IJ SOLN: 5.0000 mg | INTRAMUSCULAR | Status: DC | PRN

## 2018-06-03 MED ORDER — FENTANYL CITRATE (PF) 100 MCG/2ML IJ SOLN
INTRAMUSCULAR | Status: DC | PRN
  Administered 2018-06-03 (×3): 50 ug via INTRAVENOUS
  Administered 2018-06-03: 100 ug via INTRAVENOUS

## 2018-06-03 MED ORDER — OXYCODONE HCL 5 MG PO TABS
5.0000 mg | ORAL_TABLET | Freq: Once | ORAL | Status: AC | PRN
  Administered 2018-06-03: 5 mg via ORAL

## 2018-06-03 MED ORDER — ONDANSETRON HCL 4 MG/2ML IJ SOLN
INTRAMUSCULAR | Status: DC | PRN
  Administered 2018-06-03: 4 mg via INTRAVENOUS

## 2018-06-03 MED ORDER — FENTANYL CITRATE (PF) 250 MCG/5ML IJ SOLN
INTRAMUSCULAR | Status: AC
  Filled 2018-06-03: qty 5

## 2018-06-03 MED ORDER — SUGAMMADEX SODIUM 200 MG/2ML IV SOLN
INTRAVENOUS | Status: AC
  Filled 2018-06-03: qty 2

## 2018-06-03 MED ORDER — GLYCOPYRROLATE 0.2 MG/ML IJ SOLN
INTRAMUSCULAR | Status: DC | PRN
  Administered 2018-06-03: 0.2 mg via INTRAVENOUS

## 2018-06-03 MED ORDER — ACETAMINOPHEN 325 MG RE SUPP: 325.0000 mg | RECTAL | Status: DC | PRN

## 2018-06-03 MED ORDER — HEMOSTATIC AGENTS (NO CHARGE) OPTIME
TOPICAL | Status: DC | PRN
  Administered 2018-06-03: 1 via TOPICAL

## 2018-06-03 MED ORDER — POLYETHYLENE GLYCOL 3350 17 G PO PACK
17.0000 g | PACK | Freq: Every day | ORAL | Status: DC | PRN
Start: 2018-06-03 — End: 2018-06-07
  Administered 2018-06-05: 17 g via ORAL
  Filled 2018-06-03: qty 1

## 2018-06-03 MED ORDER — MAGNESIUM SULFATE 2 GM/50ML IV SOLN: 2.0000 g | Freq: Every day | INTRAVENOUS | Status: DC | PRN

## 2018-06-03 MED ORDER — ONDANSETRON HCL 4 MG/2ML IJ SOLN: 4.0000 mg | Freq: Four times a day (QID) | INTRAMUSCULAR | Status: DC | PRN

## 2018-06-03 MED ORDER — METOPROLOL TARTRATE 5 MG/5ML IV SOLN: 2.0000 mg | INTRAVENOUS | Status: DC | PRN

## 2018-06-03 MED ORDER — DEXAMETHASONE SODIUM PHOSPHATE 10 MG/ML IJ SOLN
INTRAMUSCULAR | Status: DC | PRN
  Administered 2018-06-03: 10 mg via INTRAVENOUS

## 2018-06-03 SURGICAL SUPPLY — 64 items
ADH SKN CLS APL DERMABOND .7 (GAUZE/BANDAGES/DRESSINGS) ×2
ADH SKN CLS LQ APL DERMABOND (GAUZE/BANDAGES/DRESSINGS) ×2
AGENT HMST SPONGE THK3/8 (HEMOSTASIS)
CANISTER SUCT 3000ML PPV (MISCELLANEOUS) ×3 IMPLANT
CANNULA VESSEL 3MM 2 BLNT TIP (CANNULA) ×3 IMPLANT
CLIP VESOCCLUDE MED 24/CT (CLIP) ×3 IMPLANT
CLIP VESOCCLUDE MED 6/CT (CLIP) ×3 IMPLANT
CLIP VESOCCLUDE SM WIDE 24/CT (CLIP) ×3 IMPLANT
CLIP VESOCCLUDE SM WIDE 6/CT (CLIP) ×3 IMPLANT
COVER DOME SNAP 22 D (MISCELLANEOUS) ×2 IMPLANT
DERMABOND ADHESIVE PROPEN (GAUZE/BANDAGES/DRESSINGS) ×1
DERMABOND ADVANCED (GAUZE/BANDAGES/DRESSINGS) ×1
DERMABOND ADVANCED .7 DNX12 (GAUZE/BANDAGES/DRESSINGS) ×2 IMPLANT
DERMABOND ADVANCED .7 DNX6 (GAUZE/BANDAGES/DRESSINGS) ×1 IMPLANT
DRAIN CHANNEL 15F RND FF W/TCR (WOUND CARE) IMPLANT
DRAIN SNY WOU (WOUND CARE) IMPLANT
ELECT REM PT RETURN 9FT ADLT (ELECTROSURGICAL) ×3
ELECTRODE REM PT RTRN 9FT ADLT (ELECTROSURGICAL) ×2 IMPLANT
EVACUATOR SILICONE 100CC (DRAIN) IMPLANT
GAUZE SPONGE 4X4 16PLY XRAY LF (GAUZE/BANDAGES/DRESSINGS) ×2 IMPLANT
GLOVE BIO SURGEON STRL SZ7.5 (GLOVE) ×3 IMPLANT
GLOVE BIOGEL PI IND STRL 6 (GLOVE) ×1 IMPLANT
GLOVE BIOGEL PI IND STRL 6.5 (GLOVE) ×1 IMPLANT
GLOVE BIOGEL PI IND STRL 7.0 (GLOVE) ×1 IMPLANT
GLOVE BIOGEL PI IND STRL 8 (GLOVE) ×2 IMPLANT
GLOVE BIOGEL PI INDICATOR 6 (GLOVE) ×1
GLOVE BIOGEL PI INDICATOR 6.5 (GLOVE) ×1
GLOVE BIOGEL PI INDICATOR 7.0 (GLOVE) ×1
GLOVE BIOGEL PI INDICATOR 8 (GLOVE) ×1
GOWN STRL REUS W/ TWL LRG LVL3 (GOWN DISPOSABLE) ×6 IMPLANT
GOWN STRL REUS W/TWL LRG LVL3 (GOWN DISPOSABLE) ×9
GRAFT BALLN CATH 65CM (STENTS) IMPLANT
GRAFT VASC PATCH XENOSURE 1X14 (Vascular Products) ×2 IMPLANT
HEMOSTAT SPONGE AVITENE ULTRA (HEMOSTASIS) IMPLANT
INSERT FOGARTY SM (MISCELLANEOUS) ×3 IMPLANT
KIT BASIN OR (CUSTOM PROCEDURE TRAY) ×3 IMPLANT
KIT ENCORE 26 ADVANTAGE (KITS) IMPLANT
KIT TURNOVER KIT B (KITS) ×3 IMPLANT
LOOP VESSEL MAXI BLUE (MISCELLANEOUS) ×4 IMPLANT
NDL PERC 18GX7CM (NEEDLE) IMPLANT
NEEDLE PERC 18GX7CM (NEEDLE) IMPLANT
NS IRRIG 1000ML POUR BTL (IV SOLUTION) ×8 IMPLANT
PACK PERIPHERAL VASCULAR (CUSTOM PROCEDURE TRAY) ×3 IMPLANT
PAD ARMBOARD 7.5X6 YLW CONV (MISCELLANEOUS) ×6 IMPLANT
STAPLER VISISTAT (STAPLE) IMPLANT
STENT GRAFT BALLN CATH 65CM (STENTS)
SUT ETHILON 3 0 PS 1 (SUTURE) IMPLANT
SUT PROLENE 5 0 C 1 24 (SUTURE) ×13 IMPLANT
SUT PROLENE 6 0 BV (SUTURE) IMPLANT
SUT PROLENE 6 0 CC (SUTURE) ×19 IMPLANT
SUT SILK 2 0 PERMA HAND 18 BK (SUTURE) IMPLANT
SUT SILK 2 0 SH (SUTURE) ×3 IMPLANT
SUT VIC AB 2-0 SH 18 (SUTURE) IMPLANT
SUT VIC AB 2-0 SH 27 (SUTURE) ×3
SUT VIC AB 2-0 SH 27XBRD (SUTURE) ×2 IMPLANT
SUT VIC AB 3-0 SH 27 (SUTURE) ×3
SUT VIC AB 3-0 SH 27X BRD (SUTURE) ×2 IMPLANT
SUT VICRYL 4-0 PS2 18IN ABS (SUTURE) ×3 IMPLANT
SYR MEDRAD MARK V 150ML (SYRINGE) IMPLANT
TOWEL GREEN STERILE (TOWEL DISPOSABLE) ×3 IMPLANT
TRAY FOLEY MTR SLVR 16FR STAT (SET/KITS/TRAYS/PACK) ×3 IMPLANT
TUBING HIGH PRESSURE 120CM (CONNECTOR) IMPLANT
UNDERPAD 30X30 (UNDERPADS AND DIAPERS) ×3 IMPLANT
WATER STERILE IRR 1000ML POUR (IV SOLUTION) ×3 IMPLANT
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
## 2018-06-03 NOTE — Anesthesia Procedure Notes (Signed)
Procedure Name: Intubation Date/Time: 06/03/2018 12:34 PM Performed by: Izora Gala, CRNA Pre-anesthesia Checklist: Patient identified, Emergency Drugs available, Suction available and Patient being monitored Patient Re-evaluated:Patient Re-evaluated prior to induction Oxygen Delivery Method: Circle system utilized Preoxygenation: Pre-oxygenation with 100% oxygen Induction Type: IV induction Ventilation: Mask ventilation without difficulty Laryngoscope Size: Miller and 3 Grade View: Grade II Tube type: Oral Tube size: 7.0 mm Number of attempts: 1 Airway Equipment and Method: Stylet Placement Confirmation: ETT inserted through vocal cords under direct vision,  positive ETCO2 and breath sounds checked- equal and bilateral Secured at: 21 cm Tube secured with: Tape Dental Injury: Teeth and Oropharynx as per pre-operative assessment

## 2018-06-03 NOTE — Transfer of Care (Signed)
Immediate Anesthesia Transfer of Care Note  Patient: Jill Higgins  Procedure(s) Performed: RIGHT FEMORAL ENDARTERECTOMY (Right Groin) PATCH ANGIOPLASTY USING A XENOSURE 1CM X 14CM BIOLOGIC PATCH (Right Groin)  Patient Location: PACU  Anesthesia Type:General  Level of Consciousness: awake and patient cooperative  Airway & Oxygen Therapy: Patient Spontanous Breathing and Patient connected to face mask oxygen  Post-op Assessment: Report given to RN, Post -op Vital signs reviewed and stable and Patient moving all extremities X 4  Post vital signs: Reviewed and stable  Last Vitals:  Vitals Value Taken Time  BP 112/59 06/03/2018  4:43 PM  Temp    Pulse 62 06/03/2018  4:44 PM  Resp 23 06/03/2018  4:44 PM  SpO2 100 % 06/03/2018  4:44 PM  Vitals shown include unvalidated device data.  Last Pain:  Vitals:   06/03/18 0918  TempSrc: Oral         Complications: No apparent anesthesia complications

## 2018-06-03 NOTE — Op Note (Signed)
Procedure: Redo right common femoral endarterectomy with endarterectomy of right external iliac artery  Preoperative diagnosis: Claudication right leg  Postoperative diagnosis: Same  Anesthesia: General  Assistant: Arlee Muslim, PA-C, Gaye Alken, RNFA  Operative findings: #1 greater than 90% stenosis distal right external iliac artery  2.  Palpable dorsalis pedis pulse at conclusion of case  3.  Bovine pericardial patch extending from 4 to 5 cm above the inguinal ligament to the femoral bifurcation  Operative details: After obtaining informed consent, the patient was taken the operating.  The patient was placed in supine position on room table.  After induction of general anesthesia and endotracheal intubation, Foley catheter was placed.  Next patient's entire right lower extremity was prepped and draped in usual sterile fashion.  Longitudinal incision was made in the right groin through a pre-existing scar.  Incision was carried on through the subtendinous tissues down level right common femoral artery.  There were dense adhesions surrounding the right common femoral artery and previous femoropopliteal bypass.  During the course of dissection of this several small holes were made in the common femoral vein which required repair with figure-of-eight 5-0 Prolene sutures.  After tedious takedown of all of the adhesions I was able to isolate one profunda branch superficial femoral artery origin and the femoropopliteal bypass.  The femoropopliteal bypass with a vein bypass it was very small about 2 mm in diameter.  It was patent.  The common femoral artery was dissected free circumferentially and then up underneath the inguinal ligament the right circumflex iliac branch was dissected free circumferentially and a vessel loop placed around this.  The left circumflex iliac branch was injured and ligated and divided between silk ties.  Dissection was carried up underneath the inguinal ligament several  centimeters up onto the external iliac artery which was soft and of good quality and above the level of stenosis.  At this point the external iliac artery was controlled with a Henley clamp after giving 7000 units of intravenous heparin.  Common femoral artery was controlled distally just above the femoral bifurcation.  A longitudinal opening was made in the artery and carried up to the distal portion of the external iliac artery.  There is a greater than 90% stenosis right at the inguinal ligament.  There was a long tongue of plaque that extended several centimeters up into the distal external iliac artery and all of this was endarterectomized.  All loose debris was removed.  A bovine pericardial patch was then brought up in the operative field and the patch angioplasty of the artery extending from about 4 cm above the inguinal ligament on the external iliac artery down to just above the femoral bifurcation.  This was done with a running 5-0 Prolene suture.  Just prior to completion of anastomosis it was forebled backbled and thoroughly flushed.  Anastomosis was secured clamps released there is pulsatile flow in the common femoral artery medially.  There was good Doppler flow in the femoropopliteal bypass with a 1+ palpable dorsalis pedis pulse and good flow in the profunda as well.  Hemostasis was obtained by administering 70 mrem of protamine and placing Avitene over the wound with direct pressure.  After this the groin was closed in multiple layers of running 2-0 and 3-0 Vicryl suture and 4-0 Vicryl subcuticular stitch in the skin.  The patient tolerated the procedure well and there were no complications.  The instrument sponge needle count was correct the end of the case.  The patient was  taken to recovery room in stable condition.  Ruta Hinds, MD [facility] [place] Office: [phone] Pager: [phone]

## 2018-06-04 ENCOUNTER — Encounter (HOSPITAL_COMMUNITY): Payer: Self-pay | Admitting: Vascular Surgery

## 2018-06-04 LAB — BASIC METABOLIC PANEL
Anion gap: 10 (ref 5–15)
BUN: 15 mg/dL (ref 8–23)
CO2: 25 mmol/L (ref 22–32)
Calcium: 9.1 mg/dL (ref 8.9–10.3)
Chloride: 103 mmol/L (ref 98–111)
Creatinine, Ser: 1.1 mg/dL — ABNORMAL HIGH (ref 0.44–1.00)
GFR calc Af Amer: 57 mL/min — ABNORMAL LOW (ref >60)
GFR calc non Af Amer: 49 mL/min — ABNORMAL LOW (ref >60)
Glucose, Bld: 274 mg/dL — ABNORMAL HIGH (ref 70–99)
Potassium: 4.1 mmol/L (ref 3.5–5.1)
Sodium: 138 mmol/L (ref 135–145)

## 2018-06-04 LAB — GLUCOSE, CAPILLARY
Glucose-Capillary: 228 mg/dL — ABNORMAL HIGH (ref 70–99)
Glucose-Capillary: 235 mg/dL — ABNORMAL HIGH (ref 70–99)
Glucose-Capillary: 246 mg/dL — ABNORMAL HIGH (ref 70–99)
Glucose-Capillary: 262 mg/dL — ABNORMAL HIGH (ref 70–99)
Glucose-Capillary: 345 mg/dL — ABNORMAL HIGH (ref 70–99)

## 2018-06-04 LAB — CBC
HCT: 33.5 % — ABNORMAL LOW (ref 36.0–46.0)
Hemoglobin: 10.6 g/dL — ABNORMAL LOW (ref 12.0–15.0)
MCH: 28.6 pg (ref 26.0–34.0)
MCHC: 31.6 g/dL (ref 30.0–36.0)
MCV: 90.3 fL (ref 78.0–100.0)
Platelets: 238 10*3/uL (ref 150–400)
RBC: 3.71 MIL/uL — ABNORMAL LOW (ref 3.87–5.11)
RDW: 14.5 % (ref 11.5–15.5)
WBC: 11 10*3/uL — ABNORMAL HIGH (ref 4.0–10.5)

## 2018-06-04 NOTE — Anesthesia Postprocedure Evaluation (Signed)
Anesthesia Post Note  Patient: Jill Higgins  Procedure(s) Performed: RIGHT FEMORAL ENDARTERECTOMY (Right Groin) PATCH ANGIOPLASTY USING A XENOSURE 1CM X 14CM BIOLOGIC PATCH (Right Groin)     Patient location during evaluation: PACU Anesthesia Type: General Level of consciousness: awake and alert Pain management: pain level controlled Vital Signs Assessment: post-procedure vital signs reviewed and stable Respiratory status: spontaneous breathing, nonlabored ventilation, respiratory function stable and patient connected to nasal cannula oxygen Cardiovascular status: blood pressure returned to baseline and stable Postop Assessment: no apparent nausea or vomiting Anesthetic complications: no    Last Vitals:  Vitals:   06/04/18 0439 06/04/18 1130  BP: (!) 103/52 116/67  Pulse: 60 (!) 54  Resp: 20 19  Temp: (!) 36.2 C   SpO2: 100% 96%    Last Pain:  Vitals:   06/04/18 1617  TempSrc:   PainSc: 2                  Ryan P Ellender

## 2018-06-04 NOTE — Evaluation (Signed)
Physical Therapy Evaluation Patient Details Name: Jill Higgins MRN: [id number] DOB: 03-06-1946 Today's Date: 06/04/2018   History of Present Illness  PMH - PAD, HTN, DM, LE bypass on both lower extremities  Clinical Impression  Pt doing well with mobility and no further PT needed.      Follow Up Recommendations No PT follow up    Equipment Recommendations  None recommended by PT    Recommendations for Other Services       Precautions / Restrictions Precautions Precautions: None Restrictions Weight Bearing Restrictions: No      Mobility  Bed Mobility Overal bed mobility: Independent                Transfers Overall transfer level: Independent                  Ambulation/Gait Ambulation/Gait assistance: Independent Gait Distance (Feet): 400 Feet Assistive device: None Gait Pattern/deviations: WFL(Within Functional Limits) Gait velocity: good Gait velocity interpretation: >4.37 ft/sec, indicative of normal walking speed General Gait Details: Steady gait  Stairs Stairs: Yes Stairs assistance: Supervision Stair Management: One rail Right;Step to pattern;Alternating pattern;Forwards Number of Stairs: 11 General stair comments: alternating stairs on way up and step to on way down  Wheelchair Mobility    Modified Rankin (Stroke Patients Only)       Balance Overall balance assessment: No apparent balance deficits (not formally assessed)                                           Pertinent Vitals/Pain Pain Assessment: No/denies pain    Home Living Family/patient expects to be discharged to:: Private residence Living Arrangements: Alone   Type of Home: House Home Access: Level entry     Home Layout: Two level;1/2 bath on main level;Bed/bath upstairs Home Equipment: Walker - 2 wheels;Cane - single point;Shower seat      Prior Function Level of Independence: Independent               Hand Dominance   Dominant  Hand: Right    Extremity/Trunk Assessment   Upper Extremity Assessment Upper Extremity Assessment: Overall WFL for tasks assessed    Lower Extremity Assessment Lower Extremity Assessment: Overall WFL for tasks assessed       Communication   Communication: No difficulties  Cognition Arousal/Alertness: Awake/alert Behavior During Therapy: WFL for tasks assessed/performed Overall Cognitive Status: Within Functional Limits for tasks assessed                                        General Comments      Exercises     Assessment/Plan    PT Assessment Patent does not need any further PT services  PT Problem List         PT Treatment Interventions      PT Goals (Current goals can be found in the Care Plan section)  Acute Rehab PT Goals PT Goal Formulation: All assessment and education complete, DC therapy    Frequency     Barriers to discharge        Co-evaluation               AM-PAC PT "6 Clicks" Daily Activity  Outcome Measure Difficulty turning over in bed (including adjusting bedclothes, sheets and blankets)?: None  Difficulty moving from lying on back to sitting on the side of the bed? : None Difficulty sitting down on and standing up from a chair with arms (e.g., wheelchair, bedside commode, etc,.)?: None Help needed moving to and from a bed to chair (including a wheelchair)?: None Help needed walking in hospital room?: None Help needed climbing 3-5 steps with a railing? : None 6 Click Score: 24    End of Session   Activity Tolerance: Patient tolerated treatment well Patient left: Other (comment)(standing in room) Nurse Communication: Mobility status PT Visit Diagnosis: Other abnormalities of gait and mobility (R26.89)    Time: 3754-3606 PT Time Calculation (min) (ACUTE ONLY): 8 min   Charges:   PT Evaluation $PT Eval Low Complexity: 1 Low     PT G CodesMarland Kitchen        [facility] PT [place] 06/04/2018,  12:30 PM

## 2018-06-04 NOTE — Progress Notes (Signed)
[facility] [place]  Subjective  - feels ok some soreness   Objective (!) 103/52 60 (!) 97.1 F (36.2 C) (Oral) 20 100%  Intake/Output Summary (Last 24 hours) at 06/04/2018 0721 Last data filed at 06/04/2018 0524 Gross per 24 hour  Intake 4280.87 ml  Output 2090 ml  Net 2190.87 ml   Right groin incision intact 2+ DP right foot  Assessment/Planning: S/p right femoral endarterectomy palpable pulse in foot Ambulate today D/c home tomorrow if pain controlled  Jill Higgins 06/04/2018 7:21 AM --  Laboratory Lab Results: Recent Labs    06/03/18 1835 06/04/18 0527  WBC 6.2 11.0*  HGB 10.4* 10.6*  HCT 32.8* 33.5*  PLT 185 238   BMET Recent Labs    06/03/18 1541 06/03/18 1835 06/04/18 0527  NA 141  --  138  K 3.5  --  4.1  CL  --   --  103  CO2  --   --  25  GLUCOSE 170*  --  274*  BUN  --   --  15  CREATININE  --  0.78 1.10*  CALCIUM  --   --  9.1    COAG Lab Results  Component Value Date   INR 0.93 05/17/2018   INR 0.97 02/03/2016   INR 0.88 09/29/2013   No results found for: PTT

## 2018-06-04 NOTE — Progress Notes (Signed)
OT Screen  Patient Details Name: Jill Higgins MRN: [id number] DOB: 15-Sep-1946   Cancelled Treatment:    Reason Eval/Treat Not Completed: OT screened, no needs identified, will sign off. Per RN and PT, pt performing near baseline function. Pt reporting she has no concerns or questions with ADLs or IADLs and plans to dc to her daughter's home at dc for initial support. All acute OT needs met and will sign off.  [place], OTR/L Acute Rehab Pager: [phone] Office: [phone] 06/04/2018, 3:33 PM

## 2018-06-04 NOTE — Progress Notes (Signed)
Inpatient Diabetes Program Recommendations  AACE/ADA: New Consensus Statement on Inpatient Glycemic Control (2015)  Target Ranges:  Prepandial:   less than 140 mg/dL      Peak postprandial:   less than 180 mg/dL (1-2 hours)      Critically ill patients:  140 - 180 mg/dL   Lab Results  Component Value Date   GLUCAP 228 (H) 06/04/2018   HGBA1C 8.7 (H) 05/17/2018    Review of Glycemic ControlResults for SHRILEY, JOFFE (MRN [id number]) as of 06/04/2018 14:24  Ref. Range 06/03/2018 16:46 06/03/2018 21:16 06/04/2018 00:20 06/04/2018 06:36 06/04/2018 11:29  Glucose-Capillary Latest Ref Range: 70 - 99 mg/dL 187 (H) 368 (H) 345 (H) 235 (H) 228 (H)   Diabetes history: Type 2 DM  Outpatient Diabetes medications:  Prandin 0.5 mg daily, Januvia 100 mg daily, Metformin 1000 mg bid Current orders for Inpatient glycemic control:  Novolog moderate tid with meals and HS Inpatient Diabetes Program Recommendations:   Note that blood sugars elevated after receiving Dexamethasone x 1.  Please consider adding Lantus 10 units daily while in the hospital.  Will follow.  Thanks,  Adah Perl, RN, BC-ADM Inpatient Diabetes Coordinator Pager [phone] (8a-5p)

## 2018-06-04 NOTE — Progress Notes (Signed)
Patient ambulated 320 feet with standby assist and no device in hallway and tolerated well.

## 2018-06-05 ENCOUNTER — Inpatient Hospital Stay (HOSPITAL_COMMUNITY): Payer: Medicare HMO

## 2018-06-05 DIAGNOSIS — M79609 Pain in unspecified limb: Secondary | ICD-10-CM

## 2018-06-05 LAB — URINALYSIS, ROUTINE W REFLEX MICROSCOPIC
Bacteria, UA: NONE SEEN
Bilirubin Urine: NEGATIVE
Glucose, UA: >=500 mg/dL — AB
Hgb urine dipstick: NEGATIVE
Ketones, ur: 5 mg/dL — AB
Leukocytes, UA: NEGATIVE
Nitrite: NEGATIVE
Protein, ur: NEGATIVE mg/dL
Specific Gravity, Urine: 1.024 (ref 1.005–1.030)
pH: 7 (ref 5.0–8.0)

## 2018-06-05 LAB — GLUCOSE, CAPILLARY
Glucose-Capillary: 330 mg/dL — ABNORMAL HIGH (ref 70–99)
Glucose-Capillary: 251 mg/dL — ABNORMAL HIGH (ref 70–99)
Glucose-Capillary: 109 mg/dL — ABNORMAL HIGH (ref 70–99)
Glucose-Capillary: 197 mg/dL — ABNORMAL HIGH (ref 70–99)

## 2018-06-05 MED ORDER — ENOXAPARIN SODIUM 80 MG/0.8ML ~~LOC~~ SOLN: 65.0000 mg | Freq: Two times a day (BID) | SUBCUTANEOUS | Status: DC

## 2018-06-05 MED ORDER — APIXABAN 5 MG PO TABS
10.0000 mg | ORAL_TABLET | Freq: Two times a day (BID) | ORAL | Status: DC
  Administered 2018-06-05 – 2018-06-07 (×4): 10 mg via ORAL
  Filled 2018-06-05 (×4): qty 2

## 2018-06-05 MED ORDER — APIXABAN 5 MG PO TABS
10.0000 mg | ORAL_TABLET | Freq: Once | ORAL | Status: AC
  Administered 2018-06-05: 10 mg via ORAL
  Filled 2018-06-05: qty 2

## 2018-06-05 MED ORDER — ENOXAPARIN SODIUM 80 MG/0.8ML ~~LOC~~ SOLN: 65.0000 mg | Freq: Once | SUBCUTANEOUS | Status: DC

## 2018-06-05 MED ORDER — CANAGLIFLOZIN 100 MG PO TABS
100.0000 mg | ORAL_TABLET | Freq: Every day | ORAL | Status: DC
  Administered 2018-06-05 – 2018-06-07 (×3): 100 mg via ORAL
  Filled 2018-06-05 (×3): qty 1

## 2018-06-05 MED ORDER — APIXABAN 5 MG PO TABS: 5.0000 mg | ORAL_TABLET | Freq: Two times a day (BID) | ORAL | Status: DC

## 2018-06-05 MED ORDER — IPRATROPIUM-ALBUTEROL 0.5-2.5 (3) MG/3ML IN SOLN
RESPIRATORY_TRACT | Status: AC
  Filled 2018-06-05: qty 3

## 2018-06-05 MED ORDER — DIAZEPAM 2 MG PO TABS
2.0000 mg | ORAL_TABLET | Freq: Once | ORAL | Status: AC
  Administered 2018-06-05: 2 mg via ORAL
  Filled 2018-06-05: qty 1

## 2018-06-05 MED ORDER — IPRATROPIUM-ALBUTEROL 0.5-2.5 (3) MG/3ML IN SOLN
3.0000 mL | Freq: Three times a day (TID) | RESPIRATORY_TRACT | Status: DC
  Administered 2018-06-05 – 2018-06-06 (×4): 3 mL via RESPIRATORY_TRACT
  Filled 2018-06-05 (×3): qty 3

## 2018-06-05 MED ORDER — METFORMIN HCL 500 MG PO TABS
1000.0000 mg | ORAL_TABLET | Freq: Two times a day (BID) | ORAL | Status: DC
  Administered 2018-06-05 – 2018-06-07 (×4): 1000 mg via ORAL
  Filled 2018-06-05 (×5): qty 2

## 2018-06-05 MED ORDER — IOPAMIDOL (ISOVUE-370) INJECTION 76%
INTRAVENOUS | Status: AC
  Administered 2018-06-05: 100 mL
  Filled 2018-06-05: qty 100

## 2018-06-05 MED ORDER — ALBUTEROL SULFATE (2.5 MG/3ML) 0.083% IN NEBU: 2.5000 mg | INHALATION_SOLUTION | RESPIRATORY_TRACT | Status: DC | PRN

## 2018-06-05 NOTE — Progress Notes (Signed)
Patient vomited large amount of undigested food and states she feels better after. Her last meal was at approx 2pm. She vomited approx 435mL of mostly solid food.

## 2018-06-05 NOTE — Progress Notes (Signed)
Patient c/o pain 8/10 to upper abdomen area-states it feels like gas pain as it comes and goes and feels like cramping.  Maalox given and patient resting in bed.

## 2018-06-05 NOTE — Progress Notes (Signed)
I was called to examine the patient due to new complaint of generalized abd pain characterized with cramping and sudden waves of pain.  Other symptoms include burping.  She has history of acid reflux however discomfort unchanged with maalox.  She has not had a bowel movement since Sunday.  On exam bowel sounds are present and abd is soft however painful to palpation all quadrants.  Incision R groin unremarkable.  Palpable R DP.  This case was discussed with Dr. Trula Slade.  We will check UA and KUB.  We will also hydrate with IVF before considering CTA abd/pelvis if discomfort continues.

## 2018-06-05 NOTE — Progress Notes (Addendum)
  Progress Note    06/05/2018 7:11 AM 2 Days Post-Op  Subjective:  Says she started coughing after walking yesterday.  Says she is not getting anything up with it.   Afebrile HR 50's-60's NSR 42'A-834'H systolic 96-22% RA  Vitals:   06/05/18 0210 06/05/18 0343  BP: (!) 143/59 (!) 95/54  Pulse: (!) 59 (!) 59  Resp: 20   Temp: 97.7 F (36.5 C) 98 F (36.7 C)  SpO2:  92%    Physical Exam: Cardiac:  regular Lungs:  Mildly labored with cough  Incisions:  Clean and dry Extremities:  Easily palpable right DP pulse   CBC    Component Value Date/Time   WBC 11.0 (H) 06/04/2018 0527   RBC 3.71 (L) 06/04/2018 0527   HGB 10.6 (L) 06/04/2018 0527   HCT 33.5 (L) 06/04/2018 0527   PLT 238 06/04/2018 0527   MCV 90.3 06/04/2018 0527   MCH 28.6 06/04/2018 0527   MCHC 31.6 06/04/2018 0527   RDW 14.5 06/04/2018 0527   LYMPHSABS 3.1 03/08/2018 2135   MONOABS 0.4 03/08/2018 2135   EOSABS 0.2 03/08/2018 2135   BASOSABS 0.0 03/08/2018 2135    BMET    Component Value Date/Time   NA 138 06/04/2018 0527   K 4.1 06/04/2018 0527   CL 103 06/04/2018 0527   CO2 25 06/04/2018 0527   GLUCOSE 274 (H) 06/04/2018 0527   BUN 15 06/04/2018 0527   CREATININE 1.10 (H) 06/04/2018 0527   CALCIUM 9.1 06/04/2018 0527   GFRNONAA 49 (L) 06/04/2018 0527   GFRAA 57 (L) 06/04/2018 0527    INR    Component Value Date/Time   INR 0.93 05/17/2018 1459     Intake/Output Summary (Last 24 hours) at 06/05/2018 0711 Last data filed at 06/05/2018 0344 Gross per 24 hour  Intake 958 ml  Output -  Net 958 ml     Assessment:  72 y.o. female is s/p:  right femoral endarterectomy palpable pulse in foot  2 Days Post-Op  Plan: -pt's incision looks good and has a palpable right DP pulse -walked in the hallways yesterday but developed a dry cough afterwards.  This is not productive but may benefit from respiratory and breathing treatment.  Will order pcxr -restarted Metformin and Jardiance this  morning as her glucose was 330.  Will hold on restarting Januvia at this time  -DVT prophylaxis:  Sq heparin   Leontine Locket, PA-C [facility] [phone] 06/05/2018 7:11 AM  2+ DP pulse on right Persistant coughing had several sutures placed in  Femoral vein need DVT US and PE CT today to rule out If negative most likely d/c tomorrow  Ruta Hinds, MD [facility] [place]: [phone] Pager: [phone]

## 2018-06-05 NOTE — Progress Notes (Addendum)
ANTICOAGULATION CONSULT NOTE - Initial Consult  Pharmacy Consult:  Lovenox Indication:  Acute DVT  Allergies  Allergen Reactions  . Adhesive [Tape] Other (See Comments)    Pt states that tape and electrodes leave red "scars" on her skin and her skin is very sensitive.    Patient Measurements: Height: 5\' 3"  (160 cm) Weight: 144 lb 9.6 oz (65.6 kg) IBW/kg (Calculated) : 52.4  Vital Signs: Temp: 98.1 F (36.7 C) (07/17 0801) Temp Source: Oral (07/17 0801) BP: 125/93 (07/17 0801) Pulse Rate: 66 (07/17 1411)  Labs: Recent Labs    06/03/18 1541 06/03/18 1835 06/04/18 0527  HGB 10.2* 10.4* 10.6*  HCT 30.0* 32.8* 33.5*  PLT  --  185 238  CREATININE  --  0.78 1.10*    Estimated Creatinine Clearance: 42.1 mL/min (A) (by C-G formula based on SCr of 1.1 mg/dL (H)).   Medical History: Past Medical History:  Diagnosis Date  . Bronchitis   . Bulging disc    in neck  . Depression with anxiety   . Diabetes mellitus    Type II  . Fibromyalgia    pt. denies  . GERD (gastroesophageal reflux disease)   . Glaucoma   . History of bronchitis   . Hyperlipidemia   . Hypertension   . Overactive bladder   . Peripheral vascular disease ([place])   . Pneumonia   . Sleep apnea    recent test negative for sleep apnea  . Tobacco abuse   . Urinary frequency       Assessment: 2 YOF presented with claudication symptoms, s/p right femoral endarterectomy on 06/03/18.  Now with acute DVT and Pharmacy consulted to initiate Lovenox.  Renal function was worsening.  No overt bleeding reported.   Goal of Therapy:  Anti-Xa level 0.6-1 units/ml 4hrs after LMWH dose given Monitor platelets by anticoagulation protocol: Yes    Plan:  D/C heparin SQ Lovenox 65mg  SQ Q12H CBC Q72H while on Lovenox Monitor closely for bleeding F/U CTA result BMET in AM   Jakeb Lamping D. Mina Marble, PharmD, BCPS, [place] 06/05/2018, 2:16 PM   =========================================   Addendum: Transition patient  to Eliquis (no bridging needed with [facility]) D/C Lovenox Eliquis 10mg  PO BID x 7 days, then on 06/12/18 start 5mg  PO BID Pharmacy will sign off and follow peripherally.  Thank you for the consult!   Erica Richwine D. Mina Marble, PharmD, BCPS, [place] 06/05/2018, 2:40 PM

## 2018-06-05 NOTE — Discharge Instructions (Addendum)
Information on my medicine - ELIQUIS (apixaban)  This medication education was reviewed with me or my healthcare representative as part of my discharge preparation.  The pharmacist that spoke with me during my hospital stay was:  Saundra Shelling, [facility]  Why was Eliquis prescribed for you? Eliquis was prescribed to treat blood clots that may have been found in the veins of your legs (deep vein thrombosis) or in your lungs (pulmonary embolism) and to reduce the risk of them occurring again.  What do You need to know about Eliquis ? The starting dose is 10 mg (two 5 mg tablets) taken TWICE daily for the FIRST SEVEN (7) DAYS, then on (enter date)  06/12/18  the dose is reduced to ONE 5 mg tablet taken TWICE daily.  Eliquis may be taken with or without food.   Try to take the dose about the same time in the morning and in the evening. If you have difficulty swallowing the tablet whole please discuss with your pharmacist how to take the medication safely.  Take Eliquis exactly as prescribed and DO NOT stop taking Eliquis without talking to the doctor who prescribed the medication.  Stopping may increase your risk of developing a new blood clot.  Refill your prescription before you run out.  After discharge, you should have regular check-up appointments with your healthcare provider that is prescribing your Eliquis.    What do you do if you miss a dose? If a dose of ELIQUIS is not taken at the scheduled time, take it as soon as possible on the same day and twice-daily administration should be resumed. The dose should not be doubled to make up for a missed dose.  Important Safety Information A possible side effect of Eliquis is bleeding. You should call your healthcare provider right away if you experience any of the following: ? Bleeding from an injury or your nose that does not stop. ? Unusual colored urine (red or dark brown) or unusual colored stools (red or black). ? Unusual bruising for  unknown reasons. ? A serious fall or if you hit your head (even if there is no bleeding).  Some medicines may interact with Eliquis and might increase your risk of bleeding or clotting while on Eliquis. To help avoid this, consult your healthcare provider or pharmacist prior to using any new prescription or non-prescription medications, including herbals, vitamins, non-steroidal anti-inflammatory drugs (NSAIDs) and supplements.  This website has more information on Eliquis (apixaban): http://www.eliquis.com/eliquis/home    [facility]  Discharge instructions  Lower Extremity Bypass Surgery  Please refer to the following instruction for your post-procedure care. Your surgeon or physician assistant will discuss any changes with you.  Activity  You are encouraged to walk as much as you can. You can slowly return to normal activities during the month after your surgery. Avoid strenuous activity and heavy lifting until your doctor tells you it's OK. Avoid activities such as vacuuming or swinging a golf club. Do not drive until your doctor give the OK and you are no longer taking prescription pain medications. It is also normal to have difficulty with sleep habits, eating and bowel movement after surgery. These will go away with time.  Bathing/Showering  Shower daily after you go home. Do not soak in a bathtub, hot tub, or swim until the incision heals completely.  Incision Care  Clean your incision with mild soap and water. Shower every day. Pat the area dry with a clean towel.  You do not need a bandage unless otherwise instructed. Do not apply any ointments or creams to your incision. If you have open wounds you will be instructed how to care for them or a visiting nurse may be arranged for you. If you have staples or sutures along your incision they will be removed at your post-op appointment. You may have skin glue on your incision. Do not peel it off. It  will come off on its own in about one week.  Wash the groin wound with soap and water daily and pat dry. (No tub bath-only shower)  Then put a dry gauze or washcloth in the groin to keep this area dry to help prevent wound infection.  Do this daily and as needed.  Do not use Vaseline or neosporin on your incisions.  Only use soap and water on your incisions and then protect and keep dry.  Diet  Resume your normal diet. There are no special food restrictions following this procedure. A low fat/ low cholesterol diet is recommended for all patients with vascular disease. In order to heal from your surgery, it is CRITICAL to get adequate nutrition. Your body requires vitamins, minerals, and protein. Vegetables are the best source of vitamins and minerals. Vegetables also provide the perfect balance of protein. Processed food has little nutritional value, so try to avoid this.  Medications  Resume taking all your medications unless your doctor or physician assistant tells you not to. If your incision is causing pain, you may take over-the-counter pain relievers such as acetaminophen (Tylenol). If you were prescribed a stronger pain medication, please aware these medication can cause nausea and constipation. Prevent nausea by taking the medication with a snack or meal. Avoid constipation by drinking plenty of fluids and eating foods with high amount of fiber, such as fruits, vegetables, and grains. Take Colace 100 mg (an over-the-counter stool softener) twice a day as needed for constipation.  Do not take Tylenol if you are taking prescription pain medications.  Follow Up  Our office will schedule a follow up appointment 2-3 weeks following discharge.  Please call us immediately for any of the following conditions  Severe or worsening pain in your legs or feet while at rest or while walking Increase pain, redness, warmth, or drainage (pus) from your incision site(s) Fever of 101 degree or higher The  swelling in your leg with the bypass suddenly worsens and becomes more painful than when you were in the hospital If you have been instructed to feel your graft pulse then you should do so every day. If you can no longer feel this pulse, call the office immediately. Not all patients are given this instruction.  Leg swelling is common after leg bypass surgery.  The swelling should improve over a few months following surgery. To improve the swelling, you may elevate your legs above the level of your heart while you are sitting or resting. Your surgeon or physician assistant may ask you to apply an ACE wrap or wear compression (TED) stockings to help to reduce swelling.  Reduce your risk of vascular disease  Stop smoking. If you would like help call [facility] at 1-800-QUIT-NOW 806-132-9723) or [place] at [phone].  Manage your cholesterol Maintain a desired weight Control your diabetes weight Control your diabetes Keep your blood pressure down  If you have any questions, please call the office at [phone]

## 2018-06-05 NOTE — Progress Notes (Signed)
Preliminary notes--Right lower extremity venous duplex exam completed.  Positive acute DVT involving right Femoral prox mid and popliteal vein.  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   Hongying Montie Swiderski (RDMS RVT) 06/05/18 1:03 PM

## 2018-06-06 LAB — BASIC METABOLIC PANEL
Anion gap: 8 (ref 5–15)
BUN: 13 mg/dL (ref 8–23)
CO2: 25 mmol/L (ref 22–32)
Calcium: 8.4 mg/dL — ABNORMAL LOW (ref 8.9–10.3)
Chloride: 107 mmol/L (ref 98–111)
Creatinine, Ser: 0.82 mg/dL (ref 0.44–1.00)
GFR calc Af Amer: >60 mL/min (ref >60)
GFR calc non Af Amer: >60 mL/min (ref >60)
Glucose, Bld: 129 mg/dL — ABNORMAL HIGH (ref 70–99)
Potassium: 3.5 mmol/L (ref 3.5–5.1)
Sodium: 140 mmol/L (ref 135–145)

## 2018-06-06 LAB — GLUCOSE, CAPILLARY
Glucose-Capillary: 136 mg/dL — ABNORMAL HIGH (ref 70–99)
Glucose-Capillary: 160 mg/dL — ABNORMAL HIGH (ref 70–99)
Glucose-Capillary: 197 mg/dL — ABNORMAL HIGH (ref 70–99)
Glucose-Capillary: 158 mg/dL — ABNORMAL HIGH (ref 70–99)

## 2018-06-06 MED ORDER — BISACODYL 10 MG RE SUPP
10.0000 mg | Freq: Once | RECTAL | Status: AC
  Administered 2018-06-06: 10 mg via RECTAL
  Filled 2018-06-06: qty 1

## 2018-06-06 MED ORDER — BISACODYL 10 MG RE SUPP: 10.0000 mg | Freq: Every day | RECTAL | Status: DC | PRN

## 2018-06-06 NOTE — Social Work (Signed)
CSW acknowledging consult for "pt needs help paying for prescriptions." For medication related consults please call or consult RN Case Manager.  Aware pt has coverage through [facility] and will unfortunately not qualify for [facility] program.  [place] signing off. Please consult if any additional needs arise.  Alexander Mt, [facility] Work [phone]

## 2018-06-06 NOTE — Progress Notes (Signed)
  Progress Note    06/06/2018 7:37 AM 3 Days Post-Op  Subjective:  Says her abdomen is feeling a little better after she vomited yesterday.  Still sore. Feels she took her meds on an empty stomach and that caused her to feel better.   Tm 100.8 now afebrile HR 60's-70's NSR 92'J-194'R systolic 74% RA  Vitals:   06/06/18 0345 06/06/18 0732  BP: (!) 97/57 108/70  Pulse: 64 66  Resp: 15 18  Temp: 98.5 F (36.9 C) 98.2 F (36.8 C)  SpO2: 92% 96%    Physical Exam: Cardiac:  regular Lungs:  Non labored Incisions:  Right groin is clean and dry Extremities:  Easily palpble right DP pulse; right calf is soft and non tender.  Abdomen:  Still with tenderness to palpation RLQ but overall, abdomen is soft. Decreased BS. -BM  CBC    Component Value Date/Time   WBC 11.0 (H) 06/04/2018 0527   RBC 3.71 (L) 06/04/2018 0527   HGB 10.6 (L) 06/04/2018 0527   HCT 33.5 (L) 06/04/2018 0527   PLT 238 06/04/2018 0527   MCV 90.3 06/04/2018 0527   MCH 28.6 06/04/2018 0527   MCHC 31.6 06/04/2018 0527   RDW 14.5 06/04/2018 0527   LYMPHSABS 3.1 03/08/2018 2135   MONOABS 0.4 03/08/2018 2135   EOSABS 0.2 03/08/2018 2135   BASOSABS 0.0 03/08/2018 2135    BMET    Component Value Date/Time   NA 140 06/06/2018 0314   K 3.5 06/06/2018 0314   CL 107 06/06/2018 0314   CO2 25 06/06/2018 0314   GLUCOSE 129 (H) 06/06/2018 0314   BUN 13 06/06/2018 0314   CREATININE 0.82 06/06/2018 0314   CALCIUM 8.4 (L) 06/06/2018 0314   GFRNONAA >60 06/06/2018 0314   GFRAA >60 06/06/2018 0314    INR    Component Value Date/Time   INR 0.93 05/17/2018 1459     Intake/Output Summary (Last 24 hours) at 06/06/2018 0737 Last data filed at 06/06/2018 0814 Gross per 24 hour  Intake 900 ml  Output 650 ml  Net 250 ml   KUB 06/05/18: IMPRESSION: No evidence of bowel obstruction.  Moderate to large stool burden may reflect constipation.  Excreted contrast within urinary bladder.  CTA chest  06/05/18: IMPRESSION: CT is negative for pulmonary emboli.  Three vessel coronary artery disease.  Aortic Atherosclerosis  RLE venous duplex 06/05/18: Final Interpretation: Right: There is evidence of acute DVT in the Popliteal vein, and Femoral vein. No cystic structure found in the popliteal fossa. Left: No evidence of common femoral vein obstruction.   Assessment:  72 y.o. female is s/p:  right femoral endarterectomy     3 Days Post-Op  Plan: -continues to have a palpable right DP pulse -RLE venous duplex +for acute DVT and pt started on Eliquis.  CT negative for PE -abdominal pain yesterday-better today after she vomited yesterday afternoon.  KUP reveals moderate to large stool burden and she hasn't had a BM in 4 days.  Will give dulcolax suppository this morning.  -will take her diabetic medications with food -DVT prophylaxis:  Eliquis -dc IVF   Leontine Locket, PA-C [facility] [phone] 06/06/2018 7:37 AM

## 2018-06-07 ENCOUNTER — Other Ambulatory Visit: Payer: Self-pay | Admitting: *Deleted

## 2018-06-07 DIAGNOSIS — I739 Peripheral vascular disease, unspecified: Secondary | ICD-10-CM

## 2018-06-07 LAB — GLUCOSE, CAPILLARY: Glucose-Capillary: 137 mg/dL — ABNORMAL HIGH (ref 70–99)

## 2018-06-07 MED ORDER — APIXABAN 5 MG PO TABS: 5.0000 mg | ORAL_TABLET | Freq: Two times a day (BID) | ORAL | 3 refills | Status: DC

## 2018-06-07 MED ORDER — APIXABAN 5 MG PO TABS: 10.0000 mg | ORAL_TABLET | Freq: Two times a day (BID) | ORAL | 0 refills | Status: DC

## 2018-06-07 MED ORDER — OXYCODONE-ACETAMINOPHEN 5-325 MG PO TABS: 1.0000 | ORAL_TABLET | Freq: Four times a day (QID) | ORAL | 0 refills | Status: DC | PRN

## 2018-06-07 NOTE — Consult Note (Addendum)
            [facility] CM Primary Care Navigator  06/07/2018  Jill Higgins 02/14/1946 [id number]  Went to see patientat the bedsideto identify possible discharge needs. Patient stateshaving problems walking due to increased cramping and weakness to lower extremities which had led to this admission.  Patient endorsesDr.Candace Tamala Julian with [place] [facility] as her primary care provider.   [facility] [address] [facility] with issues of affordability, especially DM medication- Januvia. Patient reports that primary care provider has been providing samples of Janumet to use since she can not afford to pay $125/ month for Januvia. Patient mentioned that she has 3- 4 pills (Janumet) left and will not be able to afford buying Janumet.  Patient states managing hermedications at homeusing "pill box" system filled weekly.  Patient reports thatshe was driving prior to admission but daughter Jill Higgins) can providetransportation to herdoctors' appointments if needed after discharge.  She verbalized being independent with self care prior to admission but daughter will be able to assist her with needs at home.    Discharge plan is home per patient.  Patient voiced understanding to call primary care provider's office for a post discharge follow-up appointment within 1- 2 weeks or sooner if needed.Patient was provided a reminder for it.  Explained to patient about [facility] CM services available for healthmanagement and resources at home and patient verbalized the need for medication assistance and guidance specifically with diabetes medications (what to take and what not to take). She mentioned being newly referred by primary care provider to an endocrinologist (Dr. Renato Shin) to help manage her diabetes. Her recent A1c is 8.7. Patient reports checking her blood sugar twice a day and will start recording results. She verbalized  having information/ literatures of diabetes management at home from education/ classes that she attended. Patient was seen by Inpatient diabetes coordinator during this hospitalization.    She has history of diabetes mellitus type 2 (since 2013)- on Jardiance, Januvia, Metformin (Janumet). Started on Prandin per Dr. Loanne Drilling due to inability to afford Januvia. Patient verbalized that she had been started on Eliquis for acute deep vein thrombosis on this admission.  Patient verbally agreedfor [place] and be assisted with medication affordability issues and management.  Referralto [facility] pharmacywasmade for medication assistance (affordability of DM medication), medication management and assessment for further needs after discharge.  Patientexpressed understandingto seek referralfromherprimary care provider to [facility] care management services ifdeemed necessaryand appropriatefor further assistance in thefuture.  [facility] care management contact information provided for future needs that may arise.    Addendum (06/12/18):  Called patient at home to notify of her ineligibility (re-verified) with [facility]- CM services. Patient verbalized inability to talk over the phone for now and requested to be called back again tomorrow.    Addendum (06/13/18):  Call placed to patient and was able to talk to her. Informed patient regarding her ineligibility for [facility] services. Patient expressed understanding and states "that doesn't surprise me, since I have a fixed income".  Encouraged patient to have close follow-up with endocrinologist for assistance in further managing diabetes and also to follow-up with primary care provider in maintaining/ managing health conditions. She reports having an appointment to see primary care provider on 06/19/18.     For additional questions please contact:  Edwena Felty A. Winnell Bento, BSN, RN-BC [facility] PRIMARY CARE Navigator Cell: [phone]

## 2018-06-07 NOTE — Care Management Important Message (Signed)
Important Message  Patient Details  Name: Jill Higgins MRN: [id number] Date of Birth: [birth year]   Medicare Important Message Given:  Yes    Erenest Rasher, RN 06/07/2018, 9:58 AM

## 2018-06-07 NOTE — Progress Notes (Signed)
  Progress Note    06/07/2018 7:59 AM 4 Days Post-Op  Subjective:  Feeling much better.  Cough better.  Had BM yesterday  Afebrile HR  60'Y-30'Z NSR 601'U systolic 93% RA  Vitals:   06/07/18 0002 06/07/18 0417  BP: 112/69 111/81  Pulse: 65 69  Resp: 19 14  Temp: 97.7 F (36.5 C) 97.9 F (36.6 C)  SpO2: 94% 99%    Physical Exam: Cardiac:  regular Lungs:  Non labored Incisions:  Clean and dry  Extremities:  Easily palpable right DP pulse   CBC    Component Value Date/Time   WBC 11.0 (H) 06/04/2018 0527   RBC 3.71 (L) 06/04/2018 0527   HGB 10.6 (L) 06/04/2018 0527   HCT 33.5 (L) 06/04/2018 0527   PLT 238 06/04/2018 0527   MCV 90.3 06/04/2018 0527   MCH 28.6 06/04/2018 0527   MCHC 31.6 06/04/2018 0527   RDW 14.5 06/04/2018 0527   LYMPHSABS 3.1 03/08/2018 2135   MONOABS 0.4 03/08/2018 2135   EOSABS 0.2 03/08/2018 2135   BASOSABS 0.0 03/08/2018 2135    BMET    Component Value Date/Time   NA 140 06/06/2018 0314   K 3.5 06/06/2018 0314   CL 107 06/06/2018 0314   CO2 25 06/06/2018 0314   GLUCOSE 129 (H) 06/06/2018 0314   BUN 13 06/06/2018 0314   CREATININE 0.82 06/06/2018 0314   CALCIUM 8.4 (L) 06/06/2018 0314   GFRNONAA >60 06/06/2018 0314   GFRAA >60 06/06/2018 0314    INR    Component Value Date/Time   INR 0.93 05/17/2018 1459    No intake or output data in the 24 hours ending 06/07/18 0759   Assessment:  72 y.o. female is s/p:  right femoral endarterectomy   4 Days Post-Op  Plan: -pt doing well this morning and feeling much better.  She had a BM yesterday and abdomen feeling better.  No further N/V.  -ambulating well -will ask case management for assistance with Eliquis -discharge later today as her daughter works and cannot come pick her up until then.  -f/u with Dr. Oneida Alar in 2-3 weeks with ABI's  -discussed again with pt about keeping incision dry except in the shower.  She expressed understanding.  She is doing a good job keeping it  dry.    Leontine Locket, PA-C [facility] [phone] 06/07/2018 7:59 AM

## 2018-06-07 NOTE — Care Management Note (Signed)
Case Management Note  Patient Details  Name: LESHAY DESAULNIERS MRN: [id number] Date of Birth: 02-Apr-1946  Subjective/Objective:  s/p right femoral endarterectomy     Action/Plan: NCM spoke to pt and provided with 30 day free trial card for Eliquis. NCM contacted attending Leontine Locket PA to discuss Eliquis Rx. Will need Eliquis 30 day Rx for trial offer. Contacted [facility] with Rx information. Faxed Eliquis Rx and trial card to [place].   Expected Discharge Date:  06/07/18               Expected Discharge Plan:  Home/Self Care  In-House Referral:  NA  Discharge planning Services  CM Consult, Medication Assistance  Post Acute Care Choice:  NA Choice offered to:  NA  DME Arranged:  N/A DME Agency:  NA  HH Arranged:  NA HH Agency:  NA  Status of Service:  Completed, signed off  If discussed at [place] of Stay Meetings, dates discussed:    Additional Comments:  Erenest Rasher, RN 06/07/2018, 9:53 AM

## 2018-06-07 NOTE — Progress Notes (Signed)
06/07/2018 11:22 AM Discharge AVS meds taken today and those due this evening reviewed.  Follow-up appointments and when to call md reviewed.  D/C IV and TELE.  Questions and concerns addressed.   D/C home per orders. [place]

## 2018-06-10 ENCOUNTER — Telehealth: Payer: Self-pay | Admitting: Vascular Surgery

## 2018-06-10 NOTE — Telephone Encounter (Signed)
Sched. Appt. LVM appt. 06/27/2018 1:00pm (ABI) 1:45pm p/o MD
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
## 2018-06-12 ENCOUNTER — Other Ambulatory Visit: Payer: Self-pay

## 2018-06-12 DIAGNOSIS — Z48812 Encounter for surgical aftercare following surgery on the circulatory system: Secondary | ICD-10-CM

## 2018-06-12 DIAGNOSIS — I739 Peripheral vascular disease, unspecified: Secondary | ICD-10-CM

## 2018-06-13 ENCOUNTER — Other Ambulatory Visit: Payer: Self-pay

## 2018-06-13 MED ORDER — TRAMADOL HCL 50 MG PO TABS: 50.0000 mg | ORAL_TABLET | Freq: Four times a day (QID) | ORAL | 0 refills | Status: DC | PRN

## 2018-06-18 DIAGNOSIS — I739 Peripheral vascular disease, unspecified: Secondary | ICD-10-CM | POA: Diagnosis not present

## 2018-06-18 DIAGNOSIS — I10 Essential (primary) hypertension: Secondary | ICD-10-CM | POA: Diagnosis not present

## 2018-06-18 DIAGNOSIS — E1169 Type 2 diabetes mellitus with other specified complication: Secondary | ICD-10-CM | POA: Diagnosis not present

## 2018-06-18 DIAGNOSIS — R69 Illness, unspecified: Secondary | ICD-10-CM | POA: Diagnosis not present

## 2018-06-18 DIAGNOSIS — N3281 Overactive bladder: Secondary | ICD-10-CM | POA: Diagnosis not present

## 2018-06-18 DIAGNOSIS — J449 Chronic obstructive pulmonary disease, unspecified: Secondary | ICD-10-CM | POA: Diagnosis not present

## 2018-06-18 DIAGNOSIS — Z7984 Long term (current) use of oral hypoglycemic drugs: Secondary | ICD-10-CM | POA: Diagnosis not present

## 2018-06-18 DIAGNOSIS — E7800 Pure hypercholesterolemia, unspecified: Secondary | ICD-10-CM | POA: Diagnosis not present

## 2018-06-18 DIAGNOSIS — N3000 Acute cystitis without hematuria: Secondary | ICD-10-CM | POA: Diagnosis not present

## 2018-06-19 DIAGNOSIS — N3941 Urge incontinence: Secondary | ICD-10-CM | POA: Diagnosis not present

## 2018-06-19 DIAGNOSIS — R351 Nocturia: Secondary | ICD-10-CM | POA: Diagnosis not present

## 2018-06-19 DIAGNOSIS — R350 Frequency of micturition: Secondary | ICD-10-CM | POA: Diagnosis not present

## 2018-06-19 DIAGNOSIS — R3915 Urgency of urination: Secondary | ICD-10-CM | POA: Diagnosis not present

## 2018-06-27 ENCOUNTER — Other Ambulatory Visit: Payer: Self-pay

## 2018-06-27 ENCOUNTER — Encounter: Payer: Self-pay | Admitting: Vascular Surgery

## 2018-06-27 ENCOUNTER — Ambulatory Visit (HOSPITAL_COMMUNITY)
Admission: RE | Admit: 2018-06-27 | Discharge: 2018-06-27 | Disposition: A | Discharge status: Home / Self Care | Payer: Medicare HMO | Source: Ambulatory Visit | Attending: Vascular Surgery | Admitting: Vascular Surgery

## 2018-06-27 ENCOUNTER — Ambulatory Visit (INDEPENDENT_AMBULATORY_CARE_PROVIDER_SITE_OTHER): Payer: Medicare HMO | Admitting: Vascular Surgery

## 2018-06-27 VITALS — BP 130/69 | HR 55 | Temp 97.4°F | Resp 16 | Ht 63.0 in | Wt 145.5 lb

## 2018-06-27 DIAGNOSIS — I739 Peripheral vascular disease, unspecified: Secondary | ICD-10-CM | POA: Diagnosis not present

## 2018-06-27 DIAGNOSIS — Z95828 Presence of other vascular implants and grafts: Secondary | ICD-10-CM | POA: Diagnosis not present

## 2018-06-27 DIAGNOSIS — E785 Hyperlipidemia, unspecified: Secondary | ICD-10-CM | POA: Diagnosis not present

## 2018-06-27 DIAGNOSIS — Z87891 Personal history of nicotine dependence: Secondary | ICD-10-CM | POA: Diagnosis not present

## 2018-06-27 DIAGNOSIS — I10 Essential (primary) hypertension: Secondary | ICD-10-CM | POA: Insufficient documentation

## 2018-06-27 DIAGNOSIS — Z48812 Encounter for surgical aftercare following surgery on the circulatory system: Secondary | ICD-10-CM | POA: Diagnosis not present

## 2018-06-27 NOTE — Progress Notes (Signed)
Patient is a 72 year old female who returns for follow-up today.  She recently underwent right femoral endarterectomy for a narrowing of her proximal common femoral distal external iliac artery.  She has previously undergone bilateral femoral-popliteal bypass grafts.  Her right side was originally in 2014.  Left side was in 2017.  She has also previously had an angioplasty at the distal anastomosis on the left side in 2017.  She currently has no claudication symptoms.  She is intermittently smoking.  She denies any incisional drainage on the right side.  She still has some soreness in the right groin.  Chronic medical problems remain diabetes hypertension and bronchitis.  These are all currently stable.  She did have a DVT in her right leg at the time of her operation.  She is currently on Eliquis for this.  She is also on aspirin.  Current Outpatient Medications on File Prior to Visit  Medication Sig Dispense Refill  . amLODipine (NORVASC) 5 MG tablet Take 5 mg by mouth daily.     Marland Kitchen apixaban (ELIQUIS) 5 MG TABS tablet Take 2 tablets (10 mg total) by mouth 2 (two) times daily. 18 tablet 0  . apixaban (ELIQUIS) 5 MG TABS tablet Take 1 tablet (5 mg total) by mouth 2 (two) times daily. 60 tablet 3  . aspirin EC 81 MG tablet Take 81 mg by mouth at bedtime.     . bismuth subsalicylate (PEPTO BISMOL) 262 MG/15ML suspension Take 30 mLs by mouth every 6 (six) hours as needed for indigestion or diarrhea or loose stools. Reported on 11/04/2015    . dorzolamide-timolol (COSOPT) 22.3-6.8 MG/ML ophthalmic solution Place 1 drop into both eyes 2 (two) times daily.  0  . gabapentin (NEURONTIN) 300 MG capsule Take 1 capsule (300 mg total) by mouth 3 (three) times daily. 90 capsule 1  . latanoprost (XALATAN) 0.005 % ophthalmic solution Place 1 drop into both eyes at bedtime.    . lovastatin (MEVACOR) 20 MG tablet Take 20 mg by mouth every evening.     . metFORMIN (GLUCOPHAGE) 1000 MG tablet Take 1,000 mg by mouth 2 (two)  times daily with a meal.    . methocarbamol (ROBAXIN) 500 MG tablet Take 1 tablet (500 mg total) by mouth 2 (two) times daily. 20 tablet 0  . metoprolol succinate (TOPROL-XL) 100 MG 24 hr tablet Take 100 mg by mouth daily.     . ONE TOUCH ULTRA TEST test strip USE TO CHECK BLOOD SUGAR 1-2 TIMES DAILY AS DIRECTED  2  . OVER THE COUNTER MEDICATION Apply 1 patch topically daily as needed (pain). Apply to right shoulder    . oxyCODONE-acetaminophen (PERCOCET/ROXICET) 5-325 MG tablet Take 1 tablet by mouth every 6 (six) hours as needed for moderate pain. 20 tablet 0  . pantoprazole (PROTONIX) 40 MG tablet Take 40 mg by mouth daily.    Marland Kitchen PROAIR HFA 108 (90 BASE) MCG/ACT inhaler Inhale 2 puffs into the lungs every 4 (four) hours as needed for wheezing or shortness of breath. Reported on 11/04/2015    . ranitidine (ZANTAC) 150 MG tablet Take 150 mg by mouth daily.    . repaglinide (PRANDIN) 0.5 MG tablet Take 1 tablet (0.5 mg total) by mouth 3 (three) times daily before meals. 90 tablet 11  . temazepam (RESTORIL) 30 MG capsule Take 30 mg by mouth at bedtime as needed for sleep.  0  . traMADol (ULTRAM) 50 MG tablet Take 1 tablet (50 mg total) by mouth every 6 (six)  hours as needed. 15 tablet 0   No current facility-administered medications on file prior to visit.     Physical exam:  Vitals:   06/27/18 1418  BP: 130/69  Pulse: (!) 55  Resp: 16  Temp: (!) 97.4 F (36.3 C)  TempSrc: Oral  SpO2: 97%  Weight: 145 lb 8 oz (66 kg)  Height: 5\' 3"  (1.6 m)    Extremities: Right groin well-healed incision 1-2+ dorsalis pedis pulses bilaterally  Data: Patient had a duplex ultrasound of her right leg while in hospital and had a DVT the popliteal and femoral vein.  She had bilateral ABIs performed today which were monophasic greater than 1 in the right leg biphasic greater than 1 in the left leg and I reviewed and interpreted the study.  Assessment: Patent bilateral lower externally bypass grafts with  adequate perfusion to both feet.  DVT right leg minimal swelling at this point.  Plan: The patient will follow-up in October of this year with bilateral graft duplex scans and bilateral ABIs.  She will see our nurse practitioner with that office visit.  If she has had no recurrent symptoms most likely to stop her Eliquis therapy at that point and continue just aspirin.  Ruta Hinds, MD [facility] Office: [phone] Pager: [phone]

## 2018-06-28 DIAGNOSIS — M25571 Pain in right ankle and joints of right foot: Secondary | ICD-10-CM | POA: Diagnosis not present

## 2018-06-28 DIAGNOSIS — M7752 Other enthesopathy of left foot: Secondary | ICD-10-CM | POA: Diagnosis not present

## 2018-07-01 ENCOUNTER — Other Ambulatory Visit: Payer: Self-pay

## 2018-07-01 DIAGNOSIS — Z9862 Peripheral vascular angioplasty status: Secondary | ICD-10-CM

## 2018-07-01 DIAGNOSIS — I739 Peripheral vascular disease, unspecified: Secondary | ICD-10-CM

## 2018-07-05 ENCOUNTER — Ambulatory Visit: Payer: Medicare HMO

## 2018-07-10 DIAGNOSIS — M25571 Pain in right ankle and joints of right foot: Secondary | ICD-10-CM | POA: Diagnosis not present

## 2018-07-16 DIAGNOSIS — R3915 Urgency of urination: Secondary | ICD-10-CM | POA: Diagnosis not present

## 2018-07-16 DIAGNOSIS — R350 Frequency of micturition: Secondary | ICD-10-CM | POA: Diagnosis not present

## 2018-07-16 DIAGNOSIS — N3941 Urge incontinence: Secondary | ICD-10-CM | POA: Diagnosis not present

## 2018-07-24 ENCOUNTER — Ambulatory Visit
Admission: RE | Admit: 2018-07-24 | Discharge: 2018-07-24 | Disposition: A | Discharge status: Home / Self Care | Payer: Medicare HMO | Source: Ambulatory Visit | Attending: Family Medicine | Admitting: Family Medicine

## 2018-07-24 DIAGNOSIS — Z1231 Encounter for screening mammogram for malignant neoplasm of breast: Secondary | ICD-10-CM | POA: Diagnosis not present

## 2018-07-28 DIAGNOSIS — R69 Illness, unspecified: Secondary | ICD-10-CM | POA: Diagnosis not present

## 2018-07-31 ENCOUNTER — Ambulatory Visit: Payer: Medicare HMO | Admitting: Endocrinology

## 2018-07-31 DIAGNOSIS — Z0289 Encounter for other administrative examinations: Secondary | ICD-10-CM

## 2018-08-01 DIAGNOSIS — R399 Unspecified symptoms and signs involving the genitourinary system: Secondary | ICD-10-CM | POA: Diagnosis not present

## 2018-08-08 DIAGNOSIS — R3915 Urgency of urination: Secondary | ICD-10-CM | POA: Diagnosis not present

## 2018-08-08 DIAGNOSIS — M6281 Muscle weakness (generalized): Secondary | ICD-10-CM | POA: Diagnosis not present

## 2018-08-08 DIAGNOSIS — R351 Nocturia: Secondary | ICD-10-CM | POA: Diagnosis not present

## 2018-08-08 DIAGNOSIS — N3941 Urge incontinence: Secondary | ICD-10-CM | POA: Diagnosis not present

## 2018-08-08 DIAGNOSIS — R350 Frequency of micturition: Secondary | ICD-10-CM | POA: Diagnosis not present

## 2018-08-08 DIAGNOSIS — M6289 Other specified disorders of muscle: Secondary | ICD-10-CM | POA: Diagnosis not present

## 2018-08-09 DIAGNOSIS — I10 Essential (primary) hypertension: Secondary | ICD-10-CM | POA: Diagnosis not present

## 2018-08-09 DIAGNOSIS — E1165 Type 2 diabetes mellitus with hyperglycemia: Secondary | ICD-10-CM | POA: Diagnosis not present

## 2018-08-09 DIAGNOSIS — R69 Illness, unspecified: Secondary | ICD-10-CM | POA: Diagnosis not present

## 2018-08-09 DIAGNOSIS — I739 Peripheral vascular disease, unspecified: Secondary | ICD-10-CM | POA: Diagnosis not present

## 2018-08-09 DIAGNOSIS — Z1389 Encounter for screening for other disorder: Secondary | ICD-10-CM | POA: Diagnosis not present

## 2018-08-09 DIAGNOSIS — R300 Dysuria: Secondary | ICD-10-CM | POA: Diagnosis not present

## 2018-08-09 DIAGNOSIS — J449 Chronic obstructive pulmonary disease, unspecified: Secondary | ICD-10-CM | POA: Diagnosis not present

## 2018-08-09 DIAGNOSIS — Z1211 Encounter for screening for malignant neoplasm of colon: Secondary | ICD-10-CM | POA: Diagnosis not present

## 2018-08-09 DIAGNOSIS — N3000 Acute cystitis without hematuria: Secondary | ICD-10-CM | POA: Diagnosis not present

## 2018-08-09 DIAGNOSIS — Z0001 Encounter for general adult medical examination with abnormal findings: Secondary | ICD-10-CM | POA: Diagnosis not present

## 2018-08-13 DIAGNOSIS — H401133 Primary open-angle glaucoma, bilateral, severe stage: Secondary | ICD-10-CM | POA: Diagnosis not present

## 2018-08-13 DIAGNOSIS — E119 Type 2 diabetes mellitus without complications: Secondary | ICD-10-CM | POA: Diagnosis not present

## 2018-08-28 DIAGNOSIS — S9032XA Contusion of left foot, initial encounter: Secondary | ICD-10-CM | POA: Diagnosis not present

## 2018-08-28 DIAGNOSIS — M7752 Other enthesopathy of left foot: Secondary | ICD-10-CM | POA: Diagnosis not present

## 2018-08-28 DIAGNOSIS — M25572 Pain in left ankle and joints of left foot: Secondary | ICD-10-CM | POA: Diagnosis not present

## 2018-08-29 ENCOUNTER — Ambulatory Visit (INDEPENDENT_AMBULATORY_CARE_PROVIDER_SITE_OTHER): Payer: Self-pay | Admitting: Physician Assistant

## 2018-08-29 ENCOUNTER — Ambulatory Visit (HOSPITAL_COMMUNITY)
Admission: RE | Admit: 2018-08-29 | Discharge: 2018-08-29 | Disposition: A | Discharge status: Home / Self Care | Payer: Medicare HMO | Source: Ambulatory Visit | Attending: Vascular Surgery | Admitting: Vascular Surgery

## 2018-08-29 ENCOUNTER — Ambulatory Visit (INDEPENDENT_AMBULATORY_CARE_PROVIDER_SITE_OTHER)
Admission: RE | Admit: 2018-08-29 | Discharge: 2018-08-29 | Disposition: A | Discharge status: Home / Self Care | Payer: Medicare HMO | Source: Ambulatory Visit | Attending: Vascular Surgery | Admitting: Vascular Surgery

## 2018-08-29 VITALS — BP 136/82 | HR 58 | Temp 97.0°F | Resp 16 | Ht 63.0 in | Wt 150.0 lb

## 2018-08-29 DIAGNOSIS — I739 Peripheral vascular disease, unspecified: Secondary | ICD-10-CM

## 2018-08-29 DIAGNOSIS — Z9862 Peripheral vascular angioplasty status: Secondary | ICD-10-CM | POA: Diagnosis not present

## 2018-08-29 DIAGNOSIS — I70213 Atherosclerosis of native arteries of extremities with intermittent claudication, bilateral legs: Secondary | ICD-10-CM

## 2018-08-29 NOTE — Progress Notes (Signed)
    Postoperative Visit   History of Present Illness   Jill Higgins is a 72 y.o. year old female who presents for postoperative follow-up s/p R femoral endarterectomy by Dr. Oneida Alar 06/03/18 due to threatened R fem-pop bypass.  Patient also with history of L fem-pop 2017 which subsequently required ballooning of the distal anastomosis.  She denies rest pain or active tissue ischemia.  She does complain of generalized weakness in both legs after walking about 2 blocks.  Symptoms resolve with rest and she is able to then continue walking.  She is taking aspirin and eliquis.  She has been on Eliquis for RLE DVT for >3 months.  She is an occasional smoker.  R groin incision well healed.   For VQI Use Only   PRE-ADM LIVING: Home  AMB STATUS: Ambulatory   Physical Examination   Vitals:   08/29/18 1549  BP: 136/82  Pulse: (!) 58  Resp: 16  Temp: (!) 97 F (36.1 C)  TempSrc: Oral  SpO2: 100%  Weight: 150 lb (68 kg)  Height: 5\' 3"  (1.6 m)    RLE: groin incisions is healed;  Palpable DP pulses bilaterally  BLE bypass grafts patent without hemodynamically significant stenosis R ABI 1.04 L ABI 1.02   Medical Decision Making   Jill Higgins is a 72 y.o. year old female who presents s/p R CFA endarterectomy for routine surveillance and ABIs  . She is perfusing BLE well with palpable DP pulses; no hemodynamically significant stenosis by duplex . Encouraged her to walk daily to improve distance prior to required rest . Completed anticoagulation therapy for DVT; ok to stop Eliquis . Continue aspirin . Encouraged smoking cessation . Recheck B LEAD with ABIs in 3 months    Dagoberto Ligas PA-C [facility] [place] Office: [phone]

## 2018-09-11 DIAGNOSIS — M25572 Pain in left ankle and joints of left foot: Secondary | ICD-10-CM | POA: Diagnosis not present

## 2018-09-11 DIAGNOSIS — H401133 Primary open-angle glaucoma, bilateral, severe stage: Secondary | ICD-10-CM | POA: Diagnosis not present

## 2018-09-13 ENCOUNTER — Other Ambulatory Visit: Payer: Self-pay

## 2018-09-13 DIAGNOSIS — I739 Peripheral vascular disease, unspecified: Secondary | ICD-10-CM

## 2018-09-13 DIAGNOSIS — I70213 Atherosclerosis of native arteries of extremities with intermittent claudication, bilateral legs: Secondary | ICD-10-CM

## 2018-09-16 DIAGNOSIS — Z23 Encounter for immunization: Secondary | ICD-10-CM | POA: Diagnosis not present

## 2018-09-16 DIAGNOSIS — L299 Pruritus, unspecified: Secondary | ICD-10-CM | POA: Diagnosis not present

## 2018-09-16 DIAGNOSIS — G4700 Insomnia, unspecified: Secondary | ICD-10-CM | POA: Diagnosis not present

## 2018-09-16 DIAGNOSIS — Z7984 Long term (current) use of oral hypoglycemic drugs: Secondary | ICD-10-CM | POA: Diagnosis not present

## 2018-09-16 DIAGNOSIS — E1165 Type 2 diabetes mellitus with hyperglycemia: Secondary | ICD-10-CM | POA: Diagnosis not present

## 2018-09-16 DIAGNOSIS — R69 Illness, unspecified: Secondary | ICD-10-CM | POA: Diagnosis not present

## 2018-09-16 DIAGNOSIS — Z1211 Encounter for screening for malignant neoplasm of colon: Secondary | ICD-10-CM | POA: Diagnosis not present

## 2018-09-20 DIAGNOSIS — M6281 Muscle weakness (generalized): Secondary | ICD-10-CM | POA: Diagnosis not present

## 2018-09-20 DIAGNOSIS — M6289 Other specified disorders of muscle: Secondary | ICD-10-CM | POA: Diagnosis not present

## 2018-09-20 DIAGNOSIS — R3915 Urgency of urination: Secondary | ICD-10-CM | POA: Diagnosis not present

## 2018-09-20 DIAGNOSIS — R350 Frequency of micturition: Secondary | ICD-10-CM | POA: Diagnosis not present

## 2018-09-20 DIAGNOSIS — N3941 Urge incontinence: Secondary | ICD-10-CM | POA: Diagnosis not present

## 2018-09-20 DIAGNOSIS — R351 Nocturia: Secondary | ICD-10-CM | POA: Diagnosis not present

## 2018-09-23 DIAGNOSIS — I10 Essential (primary) hypertension: Secondary | ICD-10-CM | POA: Diagnosis not present

## 2018-09-24 DIAGNOSIS — Z1211 Encounter for screening for malignant neoplasm of colon: Secondary | ICD-10-CM | POA: Diagnosis not present

## 2018-10-03 DIAGNOSIS — R3915 Urgency of urination: Secondary | ICD-10-CM | POA: Diagnosis not present

## 2018-10-03 DIAGNOSIS — R351 Nocturia: Secondary | ICD-10-CM | POA: Diagnosis not present

## 2018-10-03 DIAGNOSIS — M6289 Other specified disorders of muscle: Secondary | ICD-10-CM | POA: Diagnosis not present

## 2018-10-03 DIAGNOSIS — N3941 Urge incontinence: Secondary | ICD-10-CM | POA: Diagnosis not present

## 2018-10-03 DIAGNOSIS — R350 Frequency of micturition: Secondary | ICD-10-CM | POA: Diagnosis not present

## 2018-10-03 DIAGNOSIS — M6281 Muscle weakness (generalized): Secondary | ICD-10-CM | POA: Diagnosis not present

## 2018-10-10 DIAGNOSIS — H401133 Primary open-angle glaucoma, bilateral, severe stage: Secondary | ICD-10-CM | POA: Diagnosis not present

## 2018-10-28 DIAGNOSIS — E1169 Type 2 diabetes mellitus with other specified complication: Secondary | ICD-10-CM | POA: Diagnosis not present

## 2018-10-28 DIAGNOSIS — R69 Illness, unspecified: Secondary | ICD-10-CM | POA: Diagnosis not present

## 2018-10-28 DIAGNOSIS — Z7984 Long term (current) use of oral hypoglycemic drugs: Secondary | ICD-10-CM | POA: Diagnosis not present

## 2018-10-28 DIAGNOSIS — F439 Reaction to severe stress, unspecified: Secondary | ICD-10-CM | POA: Diagnosis not present

## 2018-10-30 DIAGNOSIS — H401133 Primary open-angle glaucoma, bilateral, severe stage: Secondary | ICD-10-CM | POA: Diagnosis not present

## 2018-10-30 DIAGNOSIS — H53433 Sector or arcuate defects, bilateral: Secondary | ICD-10-CM | POA: Diagnosis not present

## 2018-10-30 DIAGNOSIS — Z961 Presence of intraocular lens: Secondary | ICD-10-CM | POA: Diagnosis not present

## 2018-12-04 ENCOUNTER — Ambulatory Visit: Payer: Medicare Other | Admitting: Physician Assistant

## 2018-12-04 ENCOUNTER — Other Ambulatory Visit: Payer: Self-pay

## 2018-12-04 ENCOUNTER — Ambulatory Visit (INDEPENDENT_AMBULATORY_CARE_PROVIDER_SITE_OTHER)
Admission: RE | Admit: 2018-12-04 | Discharge: 2018-12-04 | Disposition: A | Discharge status: Home / Self Care | Payer: Medicare Other | Source: Ambulatory Visit | Attending: Vascular Surgery | Admitting: Vascular Surgery

## 2018-12-04 ENCOUNTER — Ambulatory Visit (HOSPITAL_COMMUNITY)
Admission: RE | Admit: 2018-12-04 | Discharge: 2018-12-04 | Disposition: A | Discharge status: Home / Self Care | Payer: Medicare Other | Source: Ambulatory Visit | Attending: Vascular Surgery | Admitting: Vascular Surgery

## 2018-12-04 VITALS — BP 140/84 | HR 62 | Temp 97.0°F | Resp 16 | Ht 63.0 in | Wt 150.0 lb

## 2018-12-04 DIAGNOSIS — Z95828 Presence of other vascular implants and grafts: Secondary | ICD-10-CM | POA: Diagnosis not present

## 2018-12-04 DIAGNOSIS — I739 Peripheral vascular disease, unspecified: Secondary | ICD-10-CM | POA: Insufficient documentation

## 2018-12-04 DIAGNOSIS — I70213 Atherosclerosis of native arteries of extremities with intermittent claudication, bilateral legs: Secondary | ICD-10-CM | POA: Insufficient documentation

## 2018-12-04 NOTE — Progress Notes (Signed)
Established Previous Bypass   History of Present Illness   Jill Higgins is a 73 y.o. (1946/01/08) female who presents to go over vascular studies.  She is followed for PAD with history of L fem pop bypass with vein in 2014 which subsequently required balloon angioplasty of distal anastomosis.  She has also had R femoral to popliteal bypass with vein.  Most recently she required R CFA and EIA endarterectomy  05/2018 due to inflow stenosis and threatened bypass.  She still has difficulty climbing stairs and occasionally has to stop for rest while walking flat ground for about 100-200 yards due to calf pain.  Patient believes these symptoms are tolerable.  She denies rest pain or active tissue ischemia.  She is taking aspirin and statin daily.  She is following up regularly with PCP for management of diabetes.  Current Outpatient Medications  Medication Sig Dispense Refill  . amLODipine (NORVASC) 5 MG tablet Take 5 mg by mouth daily.     Marland Kitchen aspirin EC 81 MG tablet Take 81 mg by mouth at bedtime.     . bismuth subsalicylate (PEPTO BISMOL) 262 MG/15ML suspension Take 30 mLs by mouth every 6 (six) hours as needed for indigestion or diarrhea or loose stools. Reported on 11/04/2015    . dorzolamide-timolol (COSOPT) 22.3-6.8 MG/ML ophthalmic solution Place 1 drop into both eyes 2 (two) times daily.  0  . gabapentin (NEURONTIN) 300 MG capsule Take 1 capsule (300 mg total) by mouth 3 (three) times daily. 90 capsule 1  . latanoprost (XALATAN) 0.005 % ophthalmic solution Place 1 drop into both eyes at bedtime.    . lovastatin (MEVACOR) 20 MG tablet Take 20 mg by mouth every evening.     . metFORMIN (GLUCOPHAGE) 1000 MG tablet Take 1,000 mg by mouth 2 (two) times daily with a meal.    . methocarbamol (ROBAXIN) 500 MG tablet Take 1 tablet (500 mg total) by mouth 2 (two) times daily. 20 tablet 0  . metoprolol succinate (TOPROL-XL) 100 MG 24 hr tablet Take 100 mg by mouth daily.     . ONE TOUCH ULTRA TEST  test strip USE TO CHECK BLOOD SUGAR 1-2 TIMES DAILY AS DIRECTED  2  . OVER THE COUNTER MEDICATION Apply 1 patch topically daily as needed (pain). Apply to right shoulder    . oxyCODONE-acetaminophen (PERCOCET/ROXICET) 5-325 MG tablet Take 1 tablet by mouth every 6 (six) hours as needed for moderate pain. 20 tablet 0  . pantoprazole (PROTONIX) 40 MG tablet Take 40 mg by mouth daily.    Marland Kitchen PROAIR HFA 108 (90 BASE) MCG/ACT inhaler Inhale 2 puffs into the lungs every 4 (four) hours as needed for wheezing or shortness of breath. Reported on 11/04/2015    . ranitidine (ZANTAC) 150 MG tablet Take 150 mg by mouth daily.    . repaglinide (PRANDIN) 0.5 MG tablet Take 1 tablet (0.5 mg total) by mouth 3 (three) times daily before meals. 90 tablet 11  . temazepam (RESTORIL) 30 MG capsule Take 30 mg by mouth at bedtime as needed for sleep.  0  . traMADol (ULTRAM) 50 MG tablet Take 1 tablet (50 mg total) by mouth every 6 (six) hours as needed. 15 tablet 0   No current facility-administered medications for this visit.     On ROS today: 10 system ROS is negative unless otherwise noted in HPI   Physical Examination   Vitals:   12/04/18 1414  BP: 140/84  Pulse: 62  Resp:  16  Temp: (!) 97 F (36.1 C)  TempSrc: Oral  SpO2: 96%  Weight: 150 lb (68 kg)  Height: 5\' 3"  (1.6 m)   Body mass index is 26.57 kg/m.  General Alert, O x 3, WD, NAD  Pulmonary Sym exp, good B air movt,  Cardiac RRR, Nl S1, S2,   Vascular Vessel Right Left  Radial Palpable Palpable  Brachial Palpable Palpable  Aorta Not palpable N/A  Popliteal Not palpable Not palpable  PT Palpable Not palpable  DP Not palpable Palpable    Musculo- skeletal M/S 5/5 throughout  , Extremities without ischemic changes  , No edema present, No visible varicosities , No Lipodermatosclerosis present  Neurologic Pain and light touch intact in extremities , Motor exam as listed above    Non-Invasive Vascular Imaging ABI   R:   ABI: 1.05    PT: tri  DP: tri  TBI:  0.59  L:   ABI: 1.14,   PT: tri  DP: tri  TBI: 0.74   Bypass Duplex   No hemodynamically significant stenosis bilaterally   Medical Decision Making   Jill Higgins is a 73 y.o. female who presents for bypass surveillance   Patent bypass grafts R and LLE without hemodynamically significant stenosis  Encouraged continued walking program  Continue aspirin and statin  Encouraged follow up with PCP for management of DM  Recheck ABIs and bypass surveillance in 6 months per protocol  Dagoberto Ligas PA-C [facility] Office: [phone]

## 2018-12-26 ENCOUNTER — Other Ambulatory Visit: Payer: Self-pay | Admitting: Vascular Surgery

## 2019-02-24 ENCOUNTER — Other Ambulatory Visit: Payer: Self-pay

## 2019-02-24 ENCOUNTER — Other Ambulatory Visit: Payer: Self-pay | Admitting: Vascular Surgery

## 2019-02-24 ENCOUNTER — Telehealth: Payer: Self-pay | Admitting: Endocrinology

## 2019-02-24 DIAGNOSIS — I739 Peripheral vascular disease, unspecified: Secondary | ICD-10-CM

## 2019-02-24 MED ORDER — GABAPENTIN 300 MG PO CAPS: 300.0000 mg | ORAL_CAPSULE | Freq: Three times a day (TID) | ORAL | 11 refills | Status: DC

## 2019-02-24 MED ORDER — GLUCOSE BLOOD VI STRP: ORAL_STRIP | 0 refills | Status: DC

## 2019-02-24 NOTE — Telephone Encounter (Signed)
Please refill x 1 Ov is due--webex is ok

## 2019-02-24 NOTE — Telephone Encounter (Signed)
MEDICATION: Accu Check Aviva Test Strips  PHARMACY: [facility] on [address]    IS THIS A 90 DAY SUPPLY : yes  IS PATIENT OUT OF MEDICATION: yes  IF NOT; HOW MUCH IS LEFT:   LAST APPOINTMENT DATE: @Visit  date not found  NEXT APPOINTMENT DATE:@Visit  date not found  DO WE HAVE YOUR PERMISSION TO LEAVE A DETAILED MESSAGE:  OTHER COMMENTS:    **Let patient know to contact pharmacy at the end of the day to make sure medication is ready. **  ** Please notify patient to allow 48-72 hours to process**  **Encourage patient to contact the pharmacy for refills or they can request refills through [facility]**

## 2019-02-24 NOTE — Telephone Encounter (Signed)
glucose blood (ACCU-CHEK AVIVA) test strip 50 each 0 02/24/2019    Sig: Use to monitor glucose levels daily; E11.9   Sent to pharmacy as: glucose blood (ACCU-CHEK AVIVA) test strip   E-Prescribing Status: Sent to pharmacy (02/24/2019 3:16 PM EDT)    Scheduled to be seen 03/24/19 per pt request

## 2019-02-24 NOTE — Telephone Encounter (Signed)
Please advise if refill is appropriate. You have not seen her since 05/2018. Advised to f/u in 2 months. Not currently scheduled for any upcoming appt (Telehealth, phone call, MyChart or in office visit)

## 2019-03-24 ENCOUNTER — Ambulatory Visit (INDEPENDENT_AMBULATORY_CARE_PROVIDER_SITE_OTHER): Payer: Medicare Other | Admitting: Endocrinology

## 2019-03-24 ENCOUNTER — Other Ambulatory Visit: Payer: Self-pay

## 2019-03-24 ENCOUNTER — Encounter: Payer: Self-pay | Admitting: Endocrinology

## 2019-03-24 DIAGNOSIS — E1151 Type 2 diabetes mellitus with diabetic peripheral angiopathy without gangrene: Secondary | ICD-10-CM | POA: Diagnosis not present

## 2019-03-24 NOTE — Patient Instructions (Addendum)
check your blood sugar once a day.  vary the time of day when you check, between before the 3 meals, and at bedtime.  also check if you have symptoms of your blood sugar being too high or too low.  please keep a record of the readings and bring it to your next appointment here (or you can bring the meter itself).  You can write it on any piece of paper.  please call us sooner if your blood sugar goes below 70, or if you have a lot of readings over 200.   Please come in to have the A1c checked Please come back for a follow-up appointment in 2 months.

## 2019-03-24 NOTE — Progress Notes (Signed)
Subjective:    Patient ID: Jill Higgins, female    DOB: 1946/10/25, 73 y.o.   MRN: [id number]  HPI telehealth visit today via doxy video visit.  Alternatives to telehealth are presented to this patient, and the patient agrees to the telehealth visit. Pt is advised of the cost of the visit, and agrees to this, also.   Patient is at home, and I am at the office.   Persons attending the telehealth visit: the patient and I Pt returns for f/u of diabetes mellitus: DM type: 2 Dx'ed: 2505 Complications: CAD and PAD Therapy: 2 oral meds GDM: never DKA: never Severe hypoglycemia: never Pancreatitis: never Pancreatic imaging: normal on 2019 CT Other: she did not tolerate glipizide (hypoglycemia).  She cannot afford [place] Interval history: She says cbg varies from 90-180.  It is in general lowest in the afternoon. pt states she feels well in general.   Past Medical History:  Diagnosis Date  . Bronchitis   . Bulging disc    in neck  . Depression with anxiety   . Diabetes mellitus    Type II  . Fibromyalgia    pt. denies  . GERD (gastroesophageal reflux disease)   . Glaucoma   . History of bronchitis   . Hyperlipidemia   . Hypertension   . Overactive bladder   . Peripheral vascular disease ([place])   . Pneumonia   . Sleep apnea    recent test negative for sleep apnea  . Tobacco abuse   . Urinary frequency     Past Surgical History:  Procedure Laterality Date  . ABDOMINAL AORTAGRAM N/A 08/15/2013   Procedure: ABDOMINAL Maxcine Ham;  Surgeon: Elam Dutch, MD;  Location: [facility] CATH LAB;  Service: Cardiovascular;  Laterality: N/A;  . ABDOMINAL AORTAGRAM N/A 06/19/2014   Procedure: ABDOMINAL Maxcine Ham;  Surgeon: Elam Dutch, MD;  Location: [facility] CATH LAB;  Service: Cardiovascular;  Laterality: N/A;  . ABDOMINAL AORTOGRAM W/LOWER EXTREMITY N/A 05/10/2018   Procedure: ABDOMINAL AORTOGRAM W/LOWER EXTREMITY;  Surgeon: Elam Dutch, MD;  Location: [employer] CV LAB;  Service:  Cardiovascular;  Laterality: N/A;  bilateral  . Aortogram w/ PTA  05/14/08,  11-04-10   Bilateral aortogram w/ bilateral  SFA PTA  stenting   . BREAST SURGERY Right    boil removal  . BUNIONECTOMY     L foot in the 1980s  . COLONOSCOPY     2014  . DILATION AND CURETTAGE OF UTERUS     X4  . ENDARTERECTOMY FEMORAL Right 06/03/2018   Procedure: RIGHT FEMORAL ENDARTERECTOMY;  Surgeon: Elam Dutch, MD;  Location: [facility] OR;  Service: Vascular;  Laterality: Right;  . Epidural shots in neck     . EYE SURGERY Bilateral 2016   cataract removal  . FEMORAL-POPLITEAL BYPASS GRAFT Left 10/21/2013   Procedure: LEFT FEMORAL-POPLITEAL ARTERY BYPASS WITH SAPHENOUS VEIN GRAFT , POPLITEAL ENDARTERECTOMY ,INTRAOPERATIVE ARTERIOGRAM, vein patch angioplasty to popliteal artery;  Surgeon: Elam Dutch, MD;  Location: [facility] OR;  Service: Vascular;  Laterality: Left;  . FEMORAL-POPLITEAL BYPASS GRAFT Right 02/08/2016   Procedure: Right FEMORAL- to Above Knee POPLITEAL ARTERY Bypass Graft with reversed saphenous vein and Common Femoral Endarterectomy  with profundoplasty;  Surgeon: Elam Dutch, MD;  Location: [place];  Service: Vascular;  Laterality: Right;  . GROIN DEBRIDEMENT Left 10/28/2013   Procedure: left inner thigh DEBRIDEMENT;  Surgeon: Elam Dutch, MD;  Location: [place];  Service: Vascular;  Laterality: Left;  . HAND SURGERY  Left 2010  . LOWER EXTREMITY ANGIOGRAM Bilateral 12/24/2015   Procedure: Lower Extremity Angiogram;  Surgeon: Elam Dutch, MD;  Location: [place] CV LAB;  Service: Cardiovascular;  Laterality: Bilateral;  . PATCH ANGIOPLASTY Right 06/03/2018   Procedure: PATCH ANGIOPLASTY USING A XENOSURE 1CM X 14CM BIOLOGIC PATCH;  Surgeon: Elam Dutch, MD;  Location: [place];  Service: Vascular;  Laterality: Right;  . PERIPHERAL VASCULAR CATHETERIZATION N/A 12/24/2015   Procedure: Abdominal Aortogram;  Surgeon: Elam Dutch, MD;  Location: [place] CV LAB;  Service:  Cardiovascular;  Laterality: N/A;  . PERIPHERAL VASCULAR CATHETERIZATION Left 12/24/2015   Procedure: Peripheral Vascular Balloon Angioplasty;  Surgeon: Elam Dutch, MD;  Location: [place] CV LAB;  Service: Cardiovascular;  Laterality: Left;  drug coated balloon  . PERIPHERAL VASCULAR CATHETERIZATION N/A 06/30/2016   Procedure: Abdominal Aortogram;  Surgeon: Elam Dutch, MD;  Location: [place] CV LAB;  Service: Cardiovascular;  Laterality: N/A;  . PERIPHERAL VASCULAR CATHETERIZATION Bilateral 06/30/2016   Procedure: Lower Extremity Angiography;  Surgeon: Elam Dutch, MD;  Location: [place] CV LAB;  Service: Cardiovascular;  Laterality: Bilateral;  . TONSILLECTOMY      Social History   Socioeconomic History  . Marital status: Single    Spouse name: Not on file  . Number of children: 1  . Years of education: Not on file  . Highest education level: Not on file  Occupational History  . Occupation: Retired    Fish farm manager: Chief of Staff  . Financial resource strain: Not on file  . Food insecurity:    Worry: Not on file    Inability: Not on file  . Transportation needs:    Medical: Not on file    Non-medical: Not on file  Tobacco Use  . Smoking status: Former Smoker    Packs/day: 0.10    Types: Cigarettes    Last attempt to quit: 10/2014    Years since quitting: 4.4  . Smokeless tobacco: Never Used  . Tobacco comment: pt states that she is using the E cig only   Substance and Sexual Activity  . Alcohol use: Yes    Alcohol/week: 0.0 - 1.0 standard drinks    Comment: Occasional  . Drug use: No  . Sexual activity: Not on file  Lifestyle  . Physical activity:    Days per week: Not on file    Minutes per session: Not on file  . Stress: Not on file  Relationships  . Social connections:    Talks on phone: Not on file    Gets together: Not on file    Attends religious service: Not on file    Active member of club or organization: Not on file    Attends  meetings of clubs or organizations: Not on file    Relationship status: Not on file  . Intimate partner violence:    Fear of current or ex partner: Not on file    Emotionally abused: Not on file    Physically abused: Not on file    Forced sexual activity: Not on file  Other Topics Concern  . Not on file  Social History Narrative   Drinks about 2 cups of coffee a day, and some tea     Current Outpatient Medications on File Prior to Visit  Medication Sig Dispense Refill  . amLODipine (NORVASC) 5 MG tablet Take 5 mg by mouth daily.     Marland Kitchen aspirin EC 81 MG tablet Take 81  mg by mouth at bedtime.     . bismuth subsalicylate (PEPTO BISMOL) 262 MG/15ML suspension Take 30 mLs by mouth every 6 (six) hours as needed for indigestion or diarrhea or loose stools. Reported on 11/04/2015    . dorzolamide-timolol (COSOPT) 22.3-6.8 MG/ML ophthalmic solution Place 1 drop into both eyes 2 (two) times daily.  0  . gabapentin (NEURONTIN) 300 MG capsule Take 1 capsule (300 mg total) by mouth 3 (three) times daily for 30 days. 90 capsule 11  . glucose blood (ACCU-CHEK AVIVA) test strip Use to monitor glucose levels daily; E11.9 50 each 0  . latanoprost (XALATAN) 0.005 % ophthalmic solution Place 1 drop into both eyes at bedtime.    . lovastatin (MEVACOR) 20 MG tablet Take 20 mg by mouth every evening.     . metFORMIN (GLUCOPHAGE) 1000 MG tablet Take 1,000 mg by mouth 2 (two) times daily with a meal.    . methocarbamol (ROBAXIN) 500 MG tablet Take 1 tablet (500 mg total) by mouth 2 (two) times daily. 20 tablet 0  . metoprolol succinate (TOPROL-XL) 100 MG 24 hr tablet Take 100 mg by mouth daily.     Marland Kitchen OVER THE COUNTER MEDICATION Apply 1 patch topically daily as needed (pain). Apply to right shoulder    . oxyCODONE-acetaminophen (PERCOCET/ROXICET) 5-325 MG tablet Take 1 tablet by mouth every 6 (six) hours as needed for moderate pain. 20 tablet 0  . pantoprazole (PROTONIX) 40 MG tablet Take 40 mg by mouth daily.     Marland Kitchen PROAIR HFA 108 (90 BASE) MCG/ACT inhaler Inhale 2 puffs into the lungs every 4 (four) hours as needed for wheezing or shortness of breath. Reported on 11/04/2015    . ranitidine (ZANTAC) 150 MG tablet Take 150 mg by mouth daily.    . repaglinide (PRANDIN) 0.5 MG tablet Take 1 tablet (0.5 mg total) by mouth 3 (three) times daily before meals. 90 tablet 11  . temazepam (RESTORIL) 30 MG capsule Take 30 mg by mouth at bedtime as needed for sleep.  0  . traMADol (ULTRAM) 50 MG tablet Take 1 tablet (50 mg total) by mouth every 6 (six) hours as needed. 15 tablet 0   No current facility-administered medications on file prior to visit.     Allergies  Allergen Reactions  . Adhesive [Tape] Other (See Comments)    Pt states that tape and electrodes leave red "scars" on her skin and her skin is very sensitive.    Family History  Problem Relation Age of Onset  . Heart disease Father   . Heart attack Father        MI at age 75  . Hypertension Mother   . Alzheimer's disease Mother   . Hypertension Sister   . Diabetes Brother   . Hyperlipidemia Brother   . Hypertension Brother   . Heart disease Brother   . Heart attack Brother   . Breast cancer Maternal Aunt     Review of Systems She denies hypoglycemia.      Objective:   Physical Exam       Assessment & Plan:  Type 2 DM, with PAD: apparently well-controlled CAD: in this context, she should avoid hypoglycemia.  Patient Instructions  check your blood sugar once a day.  vary the time of day when you check, between before the 3 meals, and at bedtime.  also check if you have symptoms of your blood sugar being too high or too low.  please keep a record of  the readings and bring it to your next appointment here (or you can bring the meter itself).  You can write it on any piece of paper.  please call us sooner if your blood sugar goes below 70, or if you have a lot of readings over 200.   Please come in to have the A1c checked Please come  back for a follow-up appointment in 2 months.

## 2019-03-27 ENCOUNTER — Ambulatory Visit (INDEPENDENT_AMBULATORY_CARE_PROVIDER_SITE_OTHER): Payer: Medicare Other

## 2019-03-27 DIAGNOSIS — E1151 Type 2 diabetes mellitus with diabetic peripheral angiopathy without gangrene: Secondary | ICD-10-CM | POA: Diagnosis not present

## 2019-03-27 LAB — POCT GLYCOSYLATED HEMOGLOBIN (HGB A1C): Hemoglobin A1C: 7.2 % — AB (ref 4.0–5.6)

## 2019-03-27 NOTE — Progress Notes (Signed)
Pt presents today for nurse visit for completion of A1C. 

## 2019-04-04 ENCOUNTER — Other Ambulatory Visit: Payer: Self-pay | Admitting: Endocrinology

## 2019-04-07 ENCOUNTER — Other Ambulatory Visit: Payer: Self-pay

## 2019-04-07 DIAGNOSIS — E1151 Type 2 diabetes mellitus with diabetic peripheral angiopathy without gangrene: Secondary | ICD-10-CM

## 2019-04-07 MED ORDER — GLUCOSE BLOOD VI STRP: ORAL_STRIP | 3 refills | Status: DC

## 2019-05-29 ENCOUNTER — Telehealth: Payer: Self-pay | Admitting: Endocrinology

## 2019-05-29 NOTE — Telephone Encounter (Signed)
MEDICATION: glucose blood (ACCU-CHEK AVIVA PLUS) test strip  PHARMACY:  [facility]   IS THIS A 90 DAY SUPPLY : YES  IS PATIENT OUT OF MEDICATION:   IF NOT; HOW MUCH IS LEFT:   LAST APPOINTMENT DATE: @5 /15/2020  NEXT APPOINTMENT DATE:@Visit  date not found  DO WE HAVE YOUR PERMISSION TO LEAVE A DETAILED MESSAGE:  OTHER COMMENTS:  Patients states that the directions state to only test once a day. She needs it to say 3-4 times a day.  Please Advise, Thanks  **Let patient know to contact pharmacy at the end of the day to make sure medication is ready. **  ** Please notify patient to allow 48-72 hours to process**  **Encourage patient to contact the pharmacy for refills or they can request refills through [facility]**

## 2019-05-30 ENCOUNTER — Other Ambulatory Visit: Payer: Self-pay

## 2019-05-30 DIAGNOSIS — E1151 Type 2 diabetes mellitus with diabetic peripheral angiopathy without gangrene: Secondary | ICD-10-CM

## 2019-05-30 DIAGNOSIS — I739 Peripheral vascular disease, unspecified: Secondary | ICD-10-CM

## 2019-05-30 MED ORDER — ACCU-CHEK AVIVA PLUS VI STRP: ORAL_STRIP | 0 refills | Status: DC

## 2019-05-30 NOTE — Telephone Encounter (Signed)
Yes, please.

## 2019-05-30 NOTE — Telephone Encounter (Signed)
LOV 03/24/19. No future appts noted. Would you like to refill for a 30day supply and advise follow up is needed?

## 2019-05-30 NOTE — Telephone Encounter (Signed)
glucose blood (ACCU-CHEK AVIVA PLUS) test strip 50 each 0 05/30/2019    Sig: Use to monitor glucose levels once daily; E11.9   Sent to pharmacy as: glucose blood (ACCU-CHEK AVIVA PLUS) test strip   Notes to Pharmacy: Overdue for an appt. Will provide 30 day supply to allow ample time for pt to schedule appt.   E-Prescribing Status: Receipt confirmed by pharmacy (05/30/2019 7:28 AM EDT)

## 2019-06-03 ENCOUNTER — Other Ambulatory Visit: Payer: Self-pay

## 2019-06-03 ENCOUNTER — Encounter: Payer: Self-pay | Admitting: Endocrinology

## 2019-06-03 ENCOUNTER — Ambulatory Visit (INDEPENDENT_AMBULATORY_CARE_PROVIDER_SITE_OTHER): Payer: Medicare Other | Admitting: Endocrinology

## 2019-06-03 VITALS — BP 142/80 | HR 62 | Ht 63.0 in | Wt 149.2 lb

## 2019-06-03 DIAGNOSIS — E1151 Type 2 diabetes mellitus with diabetic peripheral angiopathy without gangrene: Secondary | ICD-10-CM

## 2019-06-03 DIAGNOSIS — I10 Essential (primary) hypertension: Secondary | ICD-10-CM

## 2019-06-03 LAB — POCT GLYCOSYLATED HEMOGLOBIN (HGB A1C): Hemoglobin A1C: 7.3 % — AB (ref 4.0–5.6)

## 2019-06-03 MED ORDER — ACCU-CHEK AVIVA PLUS VI STRP: 1.0000 | ORAL_STRIP | Freq: Three times a day (TID) | 3 refills | Status: DC

## 2019-06-03 MED ORDER — REPAGLINIDE 1 MG PO TABS: 1.0000 mg | ORAL_TABLET | Freq: Two times a day (BID) | ORAL | 3 refills | Status: DC

## 2019-06-03 NOTE — Progress Notes (Signed)
Subjective:    Patient ID: Jill Higgins, female    DOB: [birth year], 73 y.o.   MRN: [id number]  HPI Pt returns for f/u of diabetes mellitus:  DM type: 2 Dx'ed: 1191 Complications: CAD and PAD Therapy: 2 oral meds. GDM: never DKA: never Severe hypoglycemia: never Pancreatitis: never Pancreatic imaging: normal on 2019 CT Other: she did not tolerate glipizide (hypoglycemia).  She cannot afford [place]. Interval history: She says cbg varies from 57-300.  It is in general lowest in the afternoon. pt states she feels well in general.   Past Medical History:  Diagnosis Date  . Bronchitis   . Bulging disc    in neck  . Depression with anxiety   . Diabetes mellitus    Type II  . Fibromyalgia    pt. denies  . GERD (gastroesophageal reflux disease)   . Glaucoma   . History of bronchitis   . Hyperlipidemia   . Hypertension   . Overactive bladder   . Peripheral vascular disease ([place])   . Pneumonia   . Sleep apnea    recent test negative for sleep apnea  . Tobacco abuse   . Urinary frequency     Past Surgical History:  Procedure Laterality Date  . ABDOMINAL AORTAGRAM N/A 08/15/2013   Procedure: ABDOMINAL Maxcine Ham;  Surgeon: Elam Dutch, MD;  Location: [facility] CATH LAB;  Service: Cardiovascular;  Laterality: N/A;  . ABDOMINAL AORTAGRAM N/A 06/19/2014   Procedure: ABDOMINAL Maxcine Ham;  Surgeon: Elam Dutch, MD;  Location: [facility] CATH LAB;  Service: Cardiovascular;  Laterality: N/A;  . ABDOMINAL AORTOGRAM W/LOWER EXTREMITY N/A 05/10/2018   Procedure: ABDOMINAL AORTOGRAM W/LOWER EXTREMITY;  Surgeon: Elam Dutch, MD;  Location: [place] CV LAB;  Service: Cardiovascular;  Laterality: N/A;  bilateral  . Aortogram w/ PTA  05/14/08,  11-04-10   Bilateral aortogram w/ bilateral  SFA PTA  stenting   . BREAST SURGERY Right    boil removal  . BUNIONECTOMY     L foot in the 1980s  . COLONOSCOPY     2014  . DILATION AND CURETTAGE OF UTERUS     X4  . ENDARTERECTOMY FEMORAL  Right 06/03/2018   Procedure: RIGHT FEMORAL ENDARTERECTOMY;  Surgeon: Elam Dutch, MD;  Location: [facility] OR;  Service: Vascular;  Laterality: Right;  . Epidural shots in neck     . EYE SURGERY Bilateral 2016   cataract removal  . FEMORAL-POPLITEAL BYPASS GRAFT Left 10/21/2013   Procedure: LEFT FEMORAL-POPLITEAL ARTERY BYPASS WITH SAPHENOUS VEIN GRAFT , POPLITEAL ENDARTERECTOMY ,INTRAOPERATIVE ARTERIOGRAM, vein patch angioplasty to popliteal artery;  Surgeon: Elam Dutch, MD;  Location: [facility] OR;  Service: Vascular;  Laterality: Left;  . FEMORAL-POPLITEAL BYPASS GRAFT Right 02/08/2016   Procedure: Right FEMORAL- to Above Knee POPLITEAL ARTERY Bypass Graft with reversed saphenous vein and Common Femoral Endarterectomy  with profundoplasty;  Surgeon: Elam Dutch, MD;  Location: [place];  Service: Vascular;  Laterality: Right;  . GROIN DEBRIDEMENT Left 10/28/2013   Procedure: left inner thigh DEBRIDEMENT;  Surgeon: Elam Dutch, MD;  Location: [place];  Service: Vascular;  Laterality: Left;  . HAND SURGERY Left 2010  . LOWER EXTREMITY ANGIOGRAM Bilateral 12/24/2015   Procedure: Lower Extremity Angiogram;  Surgeon: Elam Dutch, MD;  Location: [place] CV LAB;  Service: Cardiovascular;  Laterality: Bilateral;  . PATCH ANGIOPLASTY Right 06/03/2018   Procedure: PATCH ANGIOPLASTY USING A XENOSURE 1CM X 14CM BIOLOGIC PATCH;  Surgeon: Elam Dutch, MD;  Location:  [facility] OR;  Service: Vascular;  Laterality: Right;  . PERIPHERAL VASCULAR CATHETERIZATION N/A 12/24/2015   Procedure: Abdominal Aortogram;  Surgeon: Elam Dutch, MD;  Location: [place] CV LAB;  Service: Cardiovascular;  Laterality: N/A;  . PERIPHERAL VASCULAR CATHETERIZATION Left 12/24/2015   Procedure: Peripheral Vascular Balloon Angioplasty;  Surgeon: Elam Dutch, MD;  Location: [place] CV LAB;  Service: Cardiovascular;  Laterality: Left;  drug coated balloon  . PERIPHERAL VASCULAR CATHETERIZATION N/A 06/30/2016    Procedure: Abdominal Aortogram;  Surgeon: Elam Dutch, MD;  Location: [place] CV LAB;  Service: Cardiovascular;  Laterality: N/A;  . PERIPHERAL VASCULAR CATHETERIZATION Bilateral 06/30/2016   Procedure: Lower Extremity Angiography;  Surgeon: Elam Dutch, MD;  Location: [place] CV LAB;  Service: Cardiovascular;  Laterality: Bilateral;  . TONSILLECTOMY      Social History   Socioeconomic History  . Marital status: Single    Spouse name: Not on file  . Number of children: 1  . Years of education: Not on file  . Highest education level: Not on file  Occupational History  . Occupation: Retired    Fish farm manager: Chief of Staff  . Financial resource strain: Not on file  . Food insecurity    Worry: Not on file    Inability: Not on file  . Transportation needs    Medical: Not on file    Non-medical: Not on file  Tobacco Use  . Smoking status: Former Smoker    Packs/day: 0.10    Types: Cigarettes    Quit date: 10/2014    Years since quitting: 4.6  . Smokeless tobacco: Never Used  . Tobacco comment: pt states that she is using the E cig only   Substance and Sexual Activity  . Alcohol use: Yes    Alcohol/week: 0.0 - 1.0 standard drinks    Comment: Occasional  . Drug use: No  . Sexual activity: Not on file  Lifestyle  . Physical activity    Days per week: Not on file    Minutes per session: Not on file  . Stress: Not on file  Relationships  . Social Herbalist on phone: Not on file    Gets together: Not on file    Attends religious service: Not on file    Active member of club or organization: Not on file    Attends meetings of clubs or organizations: Not on file    Relationship status: Not on file  . Intimate partner violence    Fear of current or ex partner: Not on file    Emotionally abused: Not on file    Physically abused: Not on file    Forced sexual activity: Not on file  Other Topics Concern  . Not on file  Social History Narrative    Drinks about 2 cups of coffee a day, and some tea     Current Outpatient Medications on File Prior to Visit  Medication Sig Dispense Refill  . Accu-Chek Softclix Lancets lancets 1 each by Other route 3 (three) times daily. Use to monitor glucose levels TID; E11.9    . amLODipine (NORVASC) 5 MG tablet Take 5 mg by mouth daily.     Marland Kitchen aspirin EC 81 MG tablet Take 81 mg by mouth at bedtime.     . bismuth subsalicylate (PEPTO BISMOL) 262 MG/15ML suspension Take 30 mLs by mouth every 6 (six) hours as needed for indigestion or diarrhea or loose stools. Reported on 11/04/2015    .  lovastatin (MEVACOR) 20 MG tablet Take 20 mg by mouth every evening.     . metFORMIN (GLUCOPHAGE) 1000 MG tablet Take 1,000 mg by mouth 3 (three) times daily.     . methocarbamol (ROBAXIN) 500 MG tablet Take 1 tablet (500 mg total) by mouth 2 (two) times daily. 20 tablet 0  . metoprolol succinate (TOPROL-XL) 100 MG 24 hr tablet Take 100 mg by mouth daily.     Marland Kitchen OVER THE COUNTER MEDICATION Apply 1 patch topically daily as needed (pain). Apply to right shoulder    . pantoprazole (PROTONIX) 40 MG tablet Take 40 mg by mouth daily.    Marland Kitchen PROAIR HFA 108 (90 BASE) MCG/ACT inhaler Inhale 2 puffs into the lungs every 4 (four) hours as needed for wheezing or shortness of breath. Reported on 11/04/2015    . temazepam (RESTORIL) 30 MG capsule Take 30 mg by mouth at bedtime as needed for sleep.  0  . gabapentin (NEURONTIN) 300 MG capsule Take 1 capsule (300 mg total) by mouth 3 (three) times daily for 30 days. 90 capsule 11   No current facility-administered medications on file prior to visit.     Allergies  Allergen Reactions  . Adhesive [Tape] Other (See Comments)    Pt states that tape and electrodes leave red "scars" on her skin and her skin is very sensitive.    Family History  Problem Relation Age of Onset  . Heart disease Father   . Heart attack Father        MI at age 86  . Hypertension Mother   . Alzheimer's disease  Mother   . Hypertension Sister   . Diabetes Brother   . Hyperlipidemia Brother   . Hypertension Brother   . Heart disease Brother   . Heart attack Brother   . Breast cancer Maternal Aunt     BP (!) 142/80 (BP Location: Left Arm, Patient Position: Sitting, Cuff Size: Normal)   Pulse 62   Ht 5\' 3"  (1.6 m)   Wt 149 lb 3.2 oz (67.7 kg)   SpO2 95%   BMI 26.43 kg/m   Review of Systems Denies LOC    Objective:   Physical Exam VITAL SIGNS:  See vs page GENERAL: no distress Pulses: dorsalis pedis intact bilat.   MSK: no deformity of the feet CV: no leg edema Skin:  no ulcer on the feet.  normal color and temp on the feet. Neuro: sensation is intact to touch on the feet.   Lab Results  Component Value Date   CREATININE 0.82 06/06/2018   BUN 13 06/06/2018   NA 140 06/06/2018   K 3.5 06/06/2018   CL 107 06/06/2018   CO2 25 06/06/2018   Lab Results  Component Value Date   HGBA1C 7.3 (A) 06/03/2019       Assessment & Plan:  HTN: is noted today Type 2 DM: Based on the pattern of her cbg's, she needs some adjustment in her therapy Hypoglycemia: this limits aggressiveness of glycemic control.    Patient Instructions  Your blood pressure is high today.  Please see your primary care provider soon, to have it rechecked check your blood sugar three times a day.  vary the time of day when you check, between before the 3 meals, and at bedtime.  also check if you have symptoms of your blood sugar being too high or too low.  please keep a record of the readings and bring it to your next appointment here (  or you can bring the meter itself).  You can write it on any piece of paper.  please call us sooner if your blood sugar goes below 70, or if you have a lot of readings over 200.   I have sent a prescription to your pharmacy, to change the repaglinde to 1 mg with breakfast and with supper.   Please continue the same metformin. Please come back for a follow-up appointment in 2 months.

## 2019-06-03 NOTE — Patient Instructions (Addendum)
Your blood pressure is high today.  Please see your primary care provider soon, to have it rechecked check your blood sugar three times a day.  vary the time of day when you check, between before the 3 meals, and at bedtime.  also check if you have symptoms of your blood sugar being too high or too low.  please keep a record of the readings and bring it to your next appointment here (or you can bring the meter itself).  You can write it on any piece of paper.  please call us sooner if your blood sugar goes below 70, or if you have a lot of readings over 200.   I have sent a prescription to your pharmacy, to change the repaglinde to 1 mg with breakfast and with supper.   Please continue the same metformin. Please come back for a follow-up appointment in 2 months.

## 2019-06-04 ENCOUNTER — Telehealth (HOSPITAL_COMMUNITY): Payer: Self-pay

## 2019-06-04 NOTE — Telephone Encounter (Signed)
The above patient or their representative was contacted and gave the following answers to these questions:         Do you have any of the following symptoms?  NO  Fever                    Cough                   Shortness of breath  Do  you have any of the following other symptoms?    muscle pain         vomiting,        diarrhea        rash         weakness        red eye        abdominal pain         bruising          bruising or bleeding              joint pain           severe headache    Have you been in contact with someone who was or has been sick in the past 2 weeks?  NO  Yes                 Unsure                         Unable to assess   Does the person that you were in contact with have any of the following symptoms?   Cough         shortness of breath           muscle pain         vomiting,            diarrhea            rash            weakness           fever            red eye           abdominal pain           bruising  or  bleeding                joint pain                severe headache               Have you  or someone you have been in contact with traveled internationally in th last month?  NO       If yes, which countries?   Have you  or someone you have been in contact with traveled outside [place] in th last month?   NO      If yes, which state and city?   COMMENTS OR ACTION PLAN FOR THIS PATIENT:         

## 2019-06-05 ENCOUNTER — Other Ambulatory Visit: Payer: Self-pay

## 2019-06-05 ENCOUNTER — Ambulatory Visit (INDEPENDENT_AMBULATORY_CARE_PROVIDER_SITE_OTHER): Payer: Medicare Other | Admitting: Vascular Surgery

## 2019-06-05 ENCOUNTER — Encounter: Payer: Self-pay | Admitting: Vascular Surgery

## 2019-06-05 ENCOUNTER — Ambulatory Visit (HOSPITAL_COMMUNITY)
Admission: RE | Admit: 2019-06-05 | Discharge: 2019-06-05 | Disposition: A | Discharge status: Home / Self Care | Payer: Medicare Other | Source: Ambulatory Visit | Attending: Family | Admitting: Family

## 2019-06-05 ENCOUNTER — Ambulatory Visit (INDEPENDENT_AMBULATORY_CARE_PROVIDER_SITE_OTHER)
Admission: RE | Admit: 2019-06-05 | Discharge: 2019-06-05 | Disposition: A | Discharge status: Home / Self Care | Payer: Medicare Other | Source: Ambulatory Visit | Attending: Family | Admitting: Family

## 2019-06-05 VITALS — BP 146/87 | HR 56 | Temp 97.5°F | Resp 20 | Ht 63.0 in | Wt 149.8 lb

## 2019-06-05 DIAGNOSIS — I739 Peripheral vascular disease, unspecified: Secondary | ICD-10-CM

## 2019-06-05 NOTE — Progress Notes (Signed)
Patient is a 73 year old female who returns for follow-up today.  She previously underwent right femoral endarterectomy for maintenance on her right leg bypass.  This was in July 2019.  She has previously undergone bilateral femoral-popliteal bypass grafts.  Her right-sided bypass graft was originally 2014.  The left side was in 2017.  She has also previous had an angioplasty of the distal anastomosis on the left side in 2017.  She currently has no claudication symptoms.  However she is waking up at night with pain in her right knee which then radiates down to her right ankle.  She denies rest pain.  Chronic medical problems continue to remain diabetes hypertension and bronchitis.  These are all currently stable.  She currently is on aspirin and a statin.  Current Outpatient Medications on File Prior to Visit  Medication Sig Dispense Refill  . Accu-Chek Softclix Lancets lancets 1 each by Other route 3 (three) times daily. Use to monitor glucose levels TID; E11.9    . amLODipine (NORVASC) 5 MG tablet Take 5 mg by mouth daily.     Marland Kitchen aspirin EC 81 MG tablet Take 81 mg by mouth at bedtime.     . bismuth subsalicylate (PEPTO BISMOL) 262 MG/15ML suspension Take 30 mLs by mouth every 6 (six) hours as needed for indigestion or diarrhea or loose stools. Reported on 11/04/2015    . glucose blood (ACCU-CHEK AVIVA PLUS) test strip 1 each by Other route 3 (three) times daily. 270 each 3  . lovastatin (MEVACOR) 20 MG tablet Take 20 mg by mouth every evening.     . metFORMIN (GLUCOPHAGE) 1000 MG tablet Take 1,000 mg by mouth 3 (three) times daily.     . methocarbamol (ROBAXIN) 500 MG tablet Take 1 tablet (500 mg total) by mouth 2 (two) times daily. 20 tablet 0  . metoprolol succinate (TOPROL-XL) 100 MG 24 hr tablet Take 100 mg by mouth daily.     . Netarsudil-Latanoprost (ROCKLATAN) 0.02-0.005 % SOLN Apply to eye.    Marland Kitchen OVER THE COUNTER MEDICATION Apply 1 patch topically daily as needed (pain). Apply to right shoulder     . pantoprazole (PROTONIX) 40 MG tablet Take 40 mg by mouth daily.    Marland Kitchen PROAIR HFA 108 (90 BASE) MCG/ACT inhaler Inhale 2 puffs into the lungs every 4 (four) hours as needed for wheezing or shortness of breath. Reported on 11/04/2015    . repaglinide (PRANDIN) 1 MG tablet Take 1 tablet (1 mg total) by mouth 2 (two) times daily before a meal. 180 tablet 3  . temazepam (RESTORIL) 30 MG capsule Take 30 mg by mouth at bedtime as needed for sleep.  0  . gabapentin (NEURONTIN) 300 MG capsule Take 1 capsule (300 mg total) by mouth 3 (three) times daily for 30 days. 90 capsule 11   No current facility-administered medications on file prior to visit.    Review of systems: She has no shortness of breath.  She has no chest pain.  Past Medical History:  Diagnosis Date  . Bronchitis   . Bulging disc    in neck  . Depression with anxiety   . Diabetes mellitus    Type II  . Fibromyalgia    pt. denies  . GERD (gastroesophageal reflux disease)   . Glaucoma   . History of bronchitis   . Hyperlipidemia   . Hypertension   . Overactive bladder   . Peripheral vascular disease ([place])   . Pneumonia   . Sleep apnea  recent test negative for sleep apnea  . Tobacco abuse   . Urinary frequency    Past Surgical History:  Procedure Laterality Date  . ABDOMINAL AORTAGRAM N/A 08/15/2013   Procedure: ABDOMINAL Maxcine Ham;  Surgeon: Elam Dutch, MD;  Location: [facility] CATH LAB;  Service: Cardiovascular;  Laterality: N/A;  . ABDOMINAL AORTAGRAM N/A 06/19/2014   Procedure: ABDOMINAL Maxcine Ham;  Surgeon: Elam Dutch, MD;  Location: [facility] CATH LAB;  Service: Cardiovascular;  Laterality: N/A;  . ABDOMINAL AORTOGRAM W/LOWER EXTREMITY N/A 05/10/2018   Procedure: ABDOMINAL AORTOGRAM W/LOWER EXTREMITY;  Surgeon: Elam Dutch, MD;  Location: [place] CV LAB;  Service: Cardiovascular;  Laterality: N/A;  bilateral  . Aortogram w/ PTA  05/14/08,  11-04-10   Bilateral aortogram w/ bilateral  SFA PTA  stenting    . BREAST SURGERY Right    boil removal  . BUNIONECTOMY     L foot in the 1980s  . COLONOSCOPY     2014  . DILATION AND CURETTAGE OF UTERUS     X4  . ENDARTERECTOMY FEMORAL Right 06/03/2018   Procedure: RIGHT FEMORAL ENDARTERECTOMY;  Surgeon: Elam Dutch, MD;  Location: [facility] OR;  Service: Vascular;  Laterality: Right;  . Epidural shots in neck     . EYE SURGERY Bilateral 2016   cataract removal  . FEMORAL-POPLITEAL BYPASS GRAFT Left 10/21/2013   Procedure: LEFT FEMORAL-POPLITEAL ARTERY BYPASS WITH SAPHENOUS VEIN GRAFT , POPLITEAL ENDARTERECTOMY ,INTRAOPERATIVE ARTERIOGRAM, vein patch angioplasty to popliteal artery;  Surgeon: Elam Dutch, MD;  Location: [facility] OR;  Service: Vascular;  Laterality: Left;  . FEMORAL-POPLITEAL BYPASS GRAFT Right 02/08/2016   Procedure: Right FEMORAL- to Above Knee POPLITEAL ARTERY Bypass Graft with reversed saphenous vein and Common Femoral Endarterectomy  with profundoplasty;  Surgeon: Elam Dutch, MD;  Location: [place];  Service: Vascular;  Laterality: Right;  . GROIN DEBRIDEMENT Left 10/28/2013   Procedure: left inner thigh DEBRIDEMENT;  Surgeon: Elam Dutch, MD;  Location: [place];  Service: Vascular;  Laterality: Left;  . HAND SURGERY Left 2010  . LOWER EXTREMITY ANGIOGRAM Bilateral 12/24/2015   Procedure: Lower Extremity Angiogram;  Surgeon: Elam Dutch, MD;  Location: [place] CV LAB;  Service: Cardiovascular;  Laterality: Bilateral;  . PATCH ANGIOPLASTY Right 06/03/2018   Procedure: PATCH ANGIOPLASTY USING A XENOSURE 1CM X 14CM BIOLOGIC PATCH;  Surgeon: Elam Dutch, MD;  Location: [place];  Service: Vascular;  Laterality: Right;  . PERIPHERAL VASCULAR CATHETERIZATION N/A 12/24/2015   Procedure: Abdominal Aortogram;  Surgeon: Elam Dutch, MD;  Location: [place] CV LAB;  Service: Cardiovascular;  Laterality: N/A;  . PERIPHERAL VASCULAR CATHETERIZATION Left 12/24/2015   Procedure: Peripheral Vascular Balloon Angioplasty;  Surgeon:  Elam Dutch, MD;  Location: [place] CV LAB;  Service: Cardiovascular;  Laterality: Left;  drug coated balloon  . PERIPHERAL VASCULAR CATHETERIZATION N/A 06/30/2016   Procedure: Abdominal Aortogram;  Surgeon: Elam Dutch, MD;  Location: [place] CV LAB;  Service: Cardiovascular;  Laterality: N/A;  . PERIPHERAL VASCULAR CATHETERIZATION Bilateral 06/30/2016   Procedure: Lower Extremity Angiography;  Surgeon: Elam Dutch, MD;  Location: [place] CV LAB;  Service: Cardiovascular;  Laterality: Bilateral;  . TONSILLECTOMY      Physical exam:  Vitals:   06/05/19 1411  BP: (!) 146/87  Pulse: (!) 56  Resp: 20  Temp: (!) 97.5 F (36.4 C)  TempSrc: Temporal  SpO2: 97%  Weight: 149 lb 12.8 oz (67.9 kg)  Height: 5\' 3"  (1.6  m)    Neck: No JVD  Cardiac: Regular rate and rhythm  Extremities: 2+ dorsalis pedis pulses bilaterally  Skin: No open ulcer or rash  Data: Patient had bilateral ABIs performed today which were 0.96 on the right side 0.99 on the left.  She also had a duplex ultrasound of both bypass grafts which showed no evidence of narrowing widely patent bypass grafts bilaterally.  Assessment: Doing well status post bilateral femoropopliteal bypass graft with no evidence of current narrowing or recurrent stenosis.  As far as the right knee pain is concerned I think this may be more related to degenerative arthritis.  The patient has a follow-up visit scheduled with her primary care physician in about 2 months.  If she is still having pain she will have this evaluated at that point.  I told her she could take extra strength Tylenol or Advil intermittently for pain if she needs it.  Plan: Patient will follow-up with me in 1 year with bilateral bypass graft duplex and bilateral ABIs.  She will follow-up sooner if she has any increasing or recurrent claudication symptoms.  Ruta Hinds, MD [facility] [place] [facility] Office: [phone] Pager:  [phone]

## 2019-07-11 ENCOUNTER — Other Ambulatory Visit: Payer: Self-pay | Admitting: Family Medicine

## 2019-07-11 DIAGNOSIS — Z1231 Encounter for screening mammogram for malignant neoplasm of breast: Secondary | ICD-10-CM

## 2019-07-31 ENCOUNTER — Other Ambulatory Visit: Payer: Self-pay

## 2019-08-04 ENCOUNTER — Other Ambulatory Visit: Payer: Self-pay

## 2019-08-04 ENCOUNTER — Ambulatory Visit (INDEPENDENT_AMBULATORY_CARE_PROVIDER_SITE_OTHER): Payer: Medicare Other | Admitting: Endocrinology

## 2019-08-04 ENCOUNTER — Encounter: Payer: Self-pay | Admitting: Endocrinology

## 2019-08-04 VITALS — BP 142/80 | HR 70 | Ht 63.0 in | Wt 150.6 lb

## 2019-08-04 DIAGNOSIS — E1151 Type 2 diabetes mellitus with diabetic peripheral angiopathy without gangrene: Secondary | ICD-10-CM | POA: Diagnosis not present

## 2019-08-04 LAB — POCT GLYCOSYLATED HEMOGLOBIN (HGB A1C): Hemoglobin A1C: 7 % — AB (ref 4.0–5.6)

## 2019-08-04 MED ORDER — METFORMIN HCL 1000 MG PO TABS: 1000.0000 mg | ORAL_TABLET | Freq: Two times a day (BID) | ORAL | 3 refills | Status: DC

## 2019-08-04 NOTE — Patient Instructions (Addendum)
Your blood pressure is high today.  Please see your primary care provider soon, to have it rechecked check your blood sugar three times a day.  vary the time of day when you check, between before the 3 meals, and at bedtime.  also check if you have symptoms of your blood sugar being too high or too low.  please keep a record of the readings and bring it to your next appointment here (or you can bring the meter itself).  You can write it on any piece of paper.  please call us sooner if your blood sugar goes below 70, or if you have a lot of readings over 200.   Please reduce the metformin to twice a day, and: Please continue the same repaglinide.   Please come back for a follow-up appointment in 3-4 months.

## 2019-08-04 NOTE — Progress Notes (Signed)
Subjective:    Patient ID: Jill Higgins, female    DOB: Sep 18, 1946, 73 y.o.   MRN: [id number]  HPI Pt returns for f/u of diabetes mellitus:  DM type: 2 Dx'ed: 0000000 Complications: CAD and PAD Therapy: 2 oral meds. GDM: never DKA: never Severe hypoglycemia: never Pancreatitis: never Pancreatic imaging: normal on 2019 CT Other: she did not tolerate glipizide (hypoglycemia).  She cannot afford [place].   Interval history: She says cbg varies from 50-300.  It is in general lowest in the afternoon. pt states she feels well in general.    Past Medical History:  Diagnosis Date  . Bronchitis   . Bulging disc    in neck  . Depression with anxiety   . Diabetes mellitus    Type II  . Fibromyalgia    pt. denies  . GERD (gastroesophageal reflux disease)   . Glaucoma   . History of bronchitis   . Hyperlipidemia   . Hypertension   . Overactive bladder   . Peripheral vascular disease ([place])   . Pneumonia   . Sleep apnea    recent test negative for sleep apnea  . Tobacco abuse   . Urinary frequency     Past Surgical History:  Procedure Laterality Date  . ABDOMINAL AORTAGRAM N/A 08/15/2013   Procedure: ABDOMINAL Maxcine Ham;  Surgeon: Elam Dutch, MD;  Location: [facility] CATH LAB;  Service: Cardiovascular;  Laterality: N/A;  . ABDOMINAL AORTAGRAM N/A 06/19/2014   Procedure: ABDOMINAL Maxcine Ham;  Surgeon: Elam Dutch, MD;  Location: [facility] CATH LAB;  Service: Cardiovascular;  Laterality: N/A;  . ABDOMINAL AORTOGRAM W/LOWER EXTREMITY N/A 05/10/2018   Procedure: ABDOMINAL AORTOGRAM W/LOWER EXTREMITY;  Surgeon: Elam Dutch, MD;  Location: [name] CV LAB;  Service: Cardiovascular;  Laterality: N/A;  bilateral  . Aortogram w/ PTA  05/14/08,  11-04-10   Bilateral aortogram w/ bilateral  SFA PTA  stenting   . BREAST SURGERY Right    boil removal  . BUNIONECTOMY     L foot in the 1980s  . COLONOSCOPY     2014  . DILATION AND CURETTAGE OF UTERUS     X4  . ENDARTERECTOMY FEMORAL  Right 06/03/2018   Procedure: RIGHT FEMORAL ENDARTERECTOMY;  Surgeon: Elam Dutch, MD;  Location: [facility] OR;  Service: Vascular;  Laterality: Right;  . Epidural shots in neck     . EYE SURGERY Bilateral 2016   cataract removal  . FEMORAL-POPLITEAL BYPASS GRAFT Left 10/21/2013   Procedure: LEFT FEMORAL-POPLITEAL ARTERY BYPASS WITH SAPHENOUS VEIN GRAFT , POPLITEAL ENDARTERECTOMY ,INTRAOPERATIVE ARTERIOGRAM, vein patch angioplasty to popliteal artery;  Surgeon: Elam Dutch, MD;  Location: [facility] OR;  Service: Vascular;  Laterality: Left;  . FEMORAL-POPLITEAL BYPASS GRAFT Right 02/08/2016   Procedure: Right FEMORAL- to Above Knee POPLITEAL ARTERY Bypass Graft with reversed saphenous vein and Common Femoral Endarterectomy  with profundoplasty;  Surgeon: Elam Dutch, MD;  Location: [place];  Service: Vascular;  Laterality: Right;  . GROIN DEBRIDEMENT Left 10/28/2013   Procedure: left inner thigh DEBRIDEMENT;  Surgeon: Elam Dutch, MD;  Location: [place];  Service: Vascular;  Laterality: Left;  . HAND SURGERY Left 2010  . LOWER EXTREMITY ANGIOGRAM Bilateral 12/24/2015   Procedure: Lower Extremity Angiogram;  Surgeon: Elam Dutch, MD;  Location: [place] CV LAB;  Service: Cardiovascular;  Laterality: Bilateral;  . PATCH ANGIOPLASTY Right 06/03/2018   Procedure: PATCH ANGIOPLASTY USING A XENOSURE 1CM X 14CM BIOLOGIC PATCH;  Surgeon: Elam Dutch,  MD;  Location: [place];  Service: Vascular;  Laterality: Right;  . PERIPHERAL VASCULAR CATHETERIZATION N/A 12/24/2015   Procedure: Abdominal Aortogram;  Surgeon: Elam Dutch, MD;  Location: [place] CV LAB;  Service: Cardiovascular;  Laterality: N/A;  . PERIPHERAL VASCULAR CATHETERIZATION Left 12/24/2015   Procedure: Peripheral Vascular Balloon Angioplasty;  Surgeon: Elam Dutch, MD;  Location: [place] CV LAB;  Service: Cardiovascular;  Laterality: Left;  drug coated balloon  . PERIPHERAL VASCULAR CATHETERIZATION N/A 06/30/2016    Procedure: Abdominal Aortogram;  Surgeon: Elam Dutch, MD;  Location: [place] CV LAB;  Service: Cardiovascular;  Laterality: N/A;  . PERIPHERAL VASCULAR CATHETERIZATION Bilateral 06/30/2016   Procedure: Lower Extremity Angiography;  Surgeon: Elam Dutch, MD;  Location: [place] CV LAB;  Service: Cardiovascular;  Laterality: Bilateral;  . TONSILLECTOMY      Social History   Socioeconomic History  . Marital status: Single    Spouse name: Not on file  . Number of children: 1  . Years of education: Not on file  . Highest education level: Not on file  Occupational History  . Occupation: Retired    Fish farm manager: Chief of Staff  . Financial resource strain: Not on file  . Food insecurity    Worry: Not on file    Inability: Not on file  . Transportation needs    Medical: Not on file    Non-medical: Not on file  Tobacco Use  . Smoking status: Former Smoker    Packs/day: 0.10    Types: Cigarettes    Quit date: 10/2014    Years since quitting: 4.7  . Smokeless tobacco: Never Used  . Tobacco comment: pt states that she is using the E cig only   Substance and Sexual Activity  . Alcohol use: Yes    Alcohol/week: 0.0 - 1.0 standard drinks    Comment: Occasional  . Drug use: No  . Sexual activity: Not on file  Lifestyle  . Physical activity    Days per week: Not on file    Minutes per session: Not on file  . Stress: Not on file  Relationships  . Social Herbalist on phone: Not on file    Gets together: Not on file    Attends religious service: Not on file    Active member of club or organization: Not on file    Attends meetings of clubs or organizations: Not on file    Relationship status: Not on file  . Intimate partner violence    Fear of current or ex partner: Not on file    Emotionally abused: Not on file    Physically abused: Not on file    Forced sexual activity: Not on file  Other Topics Concern  . Not on file  Social History Narrative    Drinks about 2 cups of coffee a day, and some tea     Current Outpatient Medications on File Prior to Visit  Medication Sig Dispense Refill  . Accu-Chek Softclix Lancets lancets 1 each by Other route 3 (three) times daily. Use to monitor glucose levels TID; E11.9    . amLODipine (NORVASC) 5 MG tablet Take 5 mg by mouth daily.     Marland Kitchen aspirin EC 81 MG tablet Take 81 mg by mouth at bedtime.     . bismuth subsalicylate (PEPTO BISMOL) 262 MG/15ML suspension Take 30 mLs by mouth every 6 (six) hours as needed for indigestion or diarrhea or loose stools.  Reported on 11/04/2015    . gabapentin (NEURONTIN) 300 MG capsule TAKE 1 CAPSULE BY MOUTH THREE TIMES A DAY 90 capsule 1  . glucose blood (ACCU-CHEK AVIVA PLUS) test strip 1 each by Other route 3 (three) times daily. 270 each 3  . lovastatin (MEVACOR) 20 MG tablet Take 20 mg by mouth every evening.     . methocarbamol (ROBAXIN) 500 MG tablet Take 1 tablet (500 mg total) by mouth 2 (two) times daily. 20 tablet 0  . metoprolol succinate (TOPROL-XL) 100 MG 24 hr tablet Take 100 mg by mouth daily.     . Netarsudil-Latanoprost (ROCKLATAN) 0.02-0.005 % SOLN Apply to eye.    Marland Kitchen OVER THE COUNTER MEDICATION Apply 1 patch topically daily as needed (pain). Apply to right shoulder    . pantoprazole (PROTONIX) 40 MG tablet Take 40 mg by mouth daily.    Marland Kitchen PROAIR HFA 108 (90 BASE) MCG/ACT inhaler Inhale 2 puffs into the lungs every 4 (four) hours as needed for wheezing or shortness of breath. Reported on 11/04/2015    . repaglinide (PRANDIN) 1 MG tablet Take 1 tablet (1 mg total) by mouth 2 (two) times daily before a meal. 180 tablet 3  . temazepam (RESTORIL) 30 MG capsule Take 30 mg by mouth at bedtime as needed for sleep.  0   No current facility-administered medications on file prior to visit.     Allergies  Allergen Reactions  . Adhesive [Tape] Other (See Comments)    Pt states that tape and electrodes leave red "scars" on her skin and her skin is very  sensitive.    Family History  Problem Relation Age of Onset  . Heart disease Father   . Heart attack Father        MI at age 40  . Hypertension Mother   . Alzheimer's disease Mother   . Hypertension Sister   . Diabetes Brother   . Hyperlipidemia Brother   . Hypertension Brother   . Heart disease Brother   . Heart attack Brother   . Breast cancer Maternal Aunt     BP (!) 142/80 (BP Location: Left Arm, Patient Position: Sitting, Cuff Size: Normal)   Pulse 70   Ht 5\' 3"  (1.6 m)   Wt 150 lb 9.6 oz (68.3 kg)   SpO2 97%   BMI 26.68 kg/m    Review of Systems Denies LOC.      Objective:   Physical Exam VITAL SIGNS:  See vs page GENERAL: no distress Pulses: dorsalis pedis intact bilat.   MSK: no deformity of the feet CV: trace bilat leg edema Skin:  no ulcer on the feet.  normal color and temp on the feet. Neuro: sensation is intact to touch on the feet.    Lab Results  Component Value Date   CREATININE 0.82 06/06/2018   BUN 13 06/06/2018   NA 140 06/06/2018   K 3.5 06/06/2018   CL 107 06/06/2018   CO2 25 06/06/2018    Lab Results  Component Value Date   HGBA1C 7.0 (A) 08/04/2019       Assessment & Plan:  HTN: is noted today Type 2 DM, with PAD: well-controlled.  I told pt she only needs metformin 1000 mg BID Hypoglycemia: this limits aggressiveness of glycemic control.  Patient Instructions  Your blood pressure is high today.  Please see your primary care provider soon, to have it rechecked check your blood sugar three times a day.  vary the time of day  when you check, between before the 3 meals, and at bedtime.  also check if you have symptoms of your blood sugar being too high or too low.  please keep a record of the readings and bring it to your next appointment here (or you can bring the meter itself).  You can write it on any piece of paper.  please call us sooner if your blood sugar goes below 70, or if you have a lot of readings over 200.   Please  reduce the metformin to twice a day, and: Please continue the same repaglinide.   Please come back for a follow-up appointment in 3-4 months.

## 2019-08-13 ENCOUNTER — Other Ambulatory Visit: Payer: Self-pay | Admitting: Vascular Surgery

## 2019-08-13 MED ORDER — GABAPENTIN 300 MG PO CAPS: ORAL_CAPSULE | ORAL | 6 refills | Status: DC

## 2019-08-18 ENCOUNTER — Other Ambulatory Visit: Payer: Self-pay

## 2019-08-18 DIAGNOSIS — I70213 Atherosclerosis of native arteries of extremities with intermittent claudication, bilateral legs: Secondary | ICD-10-CM

## 2019-08-18 MED ORDER — GABAPENTIN 300 MG PO CAPS: ORAL_CAPSULE | ORAL | 6 refills | Status: DC

## 2019-08-22 ENCOUNTER — Emergency Department (HOSPITAL_COMMUNITY)
Admission: EM | Admit: 2019-08-22 | Discharge: 2019-08-23 | Disposition: A | Discharge status: Home / Self Care | Payer: Medicare Other | Attending: Emergency Medicine | Admitting: Emergency Medicine

## 2019-08-22 ENCOUNTER — Emergency Department (HOSPITAL_COMMUNITY): Payer: Medicare Other

## 2019-08-22 ENCOUNTER — Encounter (HOSPITAL_COMMUNITY): Payer: Self-pay | Admitting: Emergency Medicine

## 2019-08-22 DIAGNOSIS — Z7982 Long term (current) use of aspirin: Secondary | ICD-10-CM | POA: Insufficient documentation

## 2019-08-22 DIAGNOSIS — F17290 Nicotine dependence, other tobacco product, uncomplicated: Secondary | ICD-10-CM | POA: Insufficient documentation

## 2019-08-22 DIAGNOSIS — E119 Type 2 diabetes mellitus without complications: Secondary | ICD-10-CM | POA: Diagnosis not present

## 2019-08-22 DIAGNOSIS — R197 Diarrhea, unspecified: Secondary | ICD-10-CM | POA: Diagnosis not present

## 2019-08-22 DIAGNOSIS — R1084 Generalized abdominal pain: Secondary | ICD-10-CM | POA: Diagnosis not present

## 2019-08-22 DIAGNOSIS — R1013 Epigastric pain: Secondary | ICD-10-CM | POA: Diagnosis present

## 2019-08-22 DIAGNOSIS — Z79899 Other long term (current) drug therapy: Secondary | ICD-10-CM | POA: Insufficient documentation

## 2019-08-22 DIAGNOSIS — I10 Essential (primary) hypertension: Secondary | ICD-10-CM | POA: Diagnosis not present

## 2019-08-22 DIAGNOSIS — Z7984 Long term (current) use of oral hypoglycemic drugs: Secondary | ICD-10-CM | POA: Diagnosis not present

## 2019-08-22 LAB — URINALYSIS, ROUTINE W REFLEX MICROSCOPIC
Glucose, UA: NEGATIVE mg/dL
Hgb urine dipstick: NEGATIVE
Ketones, ur: NEGATIVE mg/dL
Leukocytes,Ua: NEGATIVE
Nitrite: NEGATIVE
Protein, ur: 30 mg/dL — AB
Renal Epithelial: 2
Specific Gravity, Urine: 1.029 (ref 1.005–1.030)
pH: 5 (ref 5.0–8.0)

## 2019-08-22 LAB — CBC
HCT: 42.3 % (ref 36.0–46.0)
Hemoglobin: 13.8 g/dL (ref 12.0–15.0)
MCH: 30.9 pg (ref 26.0–34.0)
MCHC: 32.6 g/dL (ref 30.0–36.0)
MCV: 94.6 fL (ref 80.0–100.0)
Platelets: 308 10*3/uL (ref 150–400)
RBC: 4.47 MIL/uL (ref 3.87–5.11)
RDW: 13 % (ref 11.5–15.5)
WBC: 9 10*3/uL (ref 4.0–10.5)
nRBC: 0 % (ref 0.0–0.2)

## 2019-08-22 LAB — COMPREHENSIVE METABOLIC PANEL
ALT: 15 U/L (ref 0–44)
AST: 16 U/L (ref 15–41)
Albumin: 4.3 g/dL (ref 3.5–5.0)
Alkaline Phosphatase: 56 U/L (ref 38–126)
Anion gap: 11 (ref 5–15)
BUN: 9 mg/dL (ref 8–23)
CO2: 24 mmol/L (ref 22–32)
Calcium: 9.4 mg/dL (ref 8.9–10.3)
Chloride: 100 mmol/L (ref 98–111)
Creatinine, Ser: 0.77 mg/dL (ref 0.44–1.00)
GFR calc Af Amer: >60 mL/min (ref >60)
GFR calc non Af Amer: >60 mL/min (ref >60)
Glucose, Bld: 177 mg/dL — ABNORMAL HIGH (ref 70–99)
Potassium: 3.9 mmol/L (ref 3.5–5.1)
Sodium: 135 mmol/L (ref 135–145)
Total Bilirubin: 0.5 mg/dL (ref 0.3–1.2)
Total Protein: 7.8 g/dL (ref 6.5–8.1)

## 2019-08-22 LAB — LACTIC ACID, PLASMA: Lactic Acid, Venous: 1 mmol/L (ref 0.5–1.9)

## 2019-08-22 LAB — LIPASE, BLOOD: Lipase: 87 U/L — ABNORMAL HIGH (ref 11–51)

## 2019-08-22 MED ORDER — ONDANSETRON HCL 4 MG/2ML IJ SOLN
4.0000 mg | Freq: Once | INTRAMUSCULAR | Status: AC
  Administered 2019-08-22: 4 mg via INTRAVENOUS
  Filled 2019-08-22: qty 2

## 2019-08-22 MED ORDER — SUCRALFATE 1 GM/10ML PO SUSP: 1.0000 g | Freq: Three times a day (TID) | ORAL | 0 refills | Status: DC

## 2019-08-22 MED ORDER — IOHEXOL 300 MG/ML  SOLN
100.0000 mL | Freq: Once | INTRAMUSCULAR | Status: AC | PRN
  Administered 2019-08-22: 23:00:00 100 mL via INTRAVENOUS

## 2019-08-22 MED ORDER — SODIUM CHLORIDE 0.9 % IV BOLUS
500.0000 mL | Freq: Once | INTRAVENOUS | Status: AC
  Administered 2019-08-22: 22:00:00 500 mL via INTRAVENOUS

## 2019-08-22 MED ORDER — HYDROCODONE-ACETAMINOPHEN 5-325 MG PO TABS: ORAL_TABLET | ORAL | 0 refills | Status: DC

## 2019-08-22 MED ORDER — MORPHINE SULFATE (PF) 4 MG/ML IV SOLN
4.0000 mg | Freq: Once | INTRAVENOUS | Status: AC
  Administered 2019-08-22: 4 mg via INTRAVENOUS
  Filled 2019-08-22: qty 1

## 2019-08-22 MED ORDER — FAMOTIDINE 20 MG PO TABS: 20.0000 mg | ORAL_TABLET | Freq: Two times a day (BID) | ORAL | 0 refills | Status: DC

## 2019-08-22 NOTE — ED Triage Notes (Signed)
Patient reports right upper abdominal pain x4 days. Denies N/V/D. Denies cough and fever. Seen at [facility] and sent for further evaluation.

## 2019-08-22 NOTE — ED Provider Notes (Signed)
[facility] DEPT Provider Note   CSN: [id number] Arrival date & time: 08/22/19  1622     History   Chief Complaint Chief Complaint  Patient presents with   Abdominal Pain    HPI Jill Higgins is a 73 y.o. female.     Patient with history of peripheral vascular disease status post femoral-popliteal bypass surgery, diabetes --presents to the emergency department today with complaint of abdominal pain that is been ongoing since 08/18/2019.  She initially attributed this to worsening reflux symptoms that she gets this frequently and had burning pain up into her chest.  Patient reports pain all over the abdomen.  Pain is currently worse in the left lower quadrant and the right upper quadrant.  She does not have any history of abdominal surgeries.  Denies any associated nausea, vomiting, diarrhea currently but did have some watery stool briefly 4 days ago.  Patient denies any chest pain, fever, cough, shortness of breath.  She has been taking Pepcid for her symptoms without much relief.  Patient was seen at the walk-in clinic and referred to the emergency department for further evaluation.  Patient denies heavy NSAID use but does take a baby aspirin daily.  Occasional alcohol use only.     Past Medical History:  Diagnosis Date   Bronchitis    Bulging disc    in neck   Depression with anxiety    Diabetes mellitus    Type II   Fibromyalgia    pt. denies   GERD (gastroesophageal reflux disease)    Glaucoma    History of bronchitis    Hyperlipidemia    Hypertension    Overactive bladder    Peripheral vascular disease (HCC)    Pneumonia    Sleep apnea    recent test negative for sleep apnea   Tobacco abuse    Urinary frequency     Patient Active Problem List   Diagnosis Date Noted   Diabetes ([place]) 05/23/2018   Other spondylosis with radiculopathy, cervical region 03/05/2018   Foraminal stenosis of cervical region 09/11/2017     Partial tear of right rotator cuff 04/04/2017   Aftercare following surgery of the circulatory system 02/23/2016   S/P femoropopliteal bypass surgery 02/23/2016   PAD (peripheral artery disease) ([place]) 02/08/2016   Leg edema, left 12/18/2013   Aftercare following surgery of the circulatory system, NEC 11/19/2013   Ischemic leg 10/21/2013   Peripheral vascular disease, unspecified ([place]) 09/18/2013   Preop cardiovascular exam 09/08/2013   Hypertension    Hyperlipidemia    Peripheral vascular disease (HCC)    Fibromyalgia    GERD (gastroesophageal reflux disease)    Depression with anxiety    Pain in limb 04/03/2013   Atherosclerosis of native artery of extremity with intermittent claudication ([place]) 08/01/2012   Claudication ([place]) 12/28/2011    Past Surgical History:  Procedure Laterality Date   ABDOMINAL AORTAGRAM N/A 08/15/2013   Procedure: ABDOMINAL Maxcine Ham;  Surgeon: Elam Dutch, MD;  Location: [facility] CATH LAB;  Service: Cardiovascular;  Laterality: N/A;   ABDOMINAL AORTAGRAM N/A 06/19/2014   Procedure: ABDOMINAL Maxcine Ham;  Surgeon: Elam Dutch, MD;  Location[facility] CATH LAB;  Service: Cardiovascular;  Laterality: N/A;   ABDOMINAL AORTOGRAM W/LOWER EXTREMITY N/A 05/10/2018   Procedure: ABDOMINAL AORTOGRAM W/LOWER EXTREMITY;  Surgeon: Elam Dutch, MD;  Location: [place] CV LAB;  Service: Cardiovascular;  Laterality: N/A;  bilateral   Aortogram w/ PTA  05/14/08,  11-04-10   Bilateral aortogram  w/ bilateral  SFA PTA  stenting    BREAST SURGERY Right    boil removal   BUNIONECTOMY     L foot in the 1980s   COLONOSCOPY     2014   DILATION AND CURETTAGE OF UTERUS     X4   ENDARTERECTOMY FEMORAL Right 06/03/2018   Procedure: RIGHT FEMORAL ENDARTERECTOMY;  Surgeon: Elam Dutch, MD;  Location: [place];  Service: Vascular;  Laterality: Right;   Epidural shots in neck      EYE SURGERY Bilateral 2016   cataract removal    FEMORAL-POPLITEAL BYPASS GRAFT Left 10/21/2013   Procedure: LEFT FEMORAL-POPLITEAL ARTERY BYPASS WITH SAPHENOUS VEIN GRAFT , POPLITEAL ENDARTERECTOMY ,INTRAOPERATIVE ARTERIOGRAM, vein patch angioplasty to popliteal artery;  Surgeon: Elam Dutch, MD;  Location: [facility] OR;  Service: Vascular;  Laterality: Left;   FEMORAL-POPLITEAL BYPASS GRAFT Right 02/08/2016   Procedure: Right FEMORAL- to Above Knee POPLITEAL ARTERY Bypass Graft with reversed saphenous vein and Common Femoral Endarterectomy  with profundoplasty;  Surgeon: Elam Dutch, MD;  Location: [place];  Service: Vascular;  Laterality: Right;   GROIN DEBRIDEMENT Left 10/28/2013   Procedure: left inner thigh DEBRIDEMENT;  Surgeon: Elam Dutch, MD;  Location: [place];  Service: Vascular;  Laterality: Left;   HAND SURGERY Left 2010   LOWER EXTREMITY ANGIOGRAM Bilateral 12/24/2015   Procedure: Lower Extremity Angiogram;  Surgeon: Elam Dutch, MD;  Location: [place] CV LAB;  Service: Cardiovascular;  Laterality: Bilateral;   PATCH ANGIOPLASTY Right 06/03/2018   Procedure: PATCH ANGIOPLASTY USING A XENOSURE 1CM X 14CM BIOLOGIC PATCH;  Surgeon: Elam Dutch, MD;  Location: [facility] OR;  Service: Vascular;  Laterality: Right;   PERIPHERAL VASCULAR CATHETERIZATION N/A 12/24/2015   Procedure: Abdominal Aortogram;  Surgeon: Elam Dutch, MD;  Location: [place] CV LAB;  Service: Cardiovascular;  Laterality: N/A;   PERIPHERAL VASCULAR CATHETERIZATION Left 12/24/2015   Procedure: Peripheral Vascular Balloon Angioplasty;  Surgeon: Elam Dutch, MD;  Location: [place] CV LAB;  Service: Cardiovascular;  Laterality: Left;  drug coated balloon   PERIPHERAL VASCULAR CATHETERIZATION N/A 06/30/2016   Procedure: Abdominal Aortogram;  Surgeon: Elam Dutch, MD;  Location: [place] CV LAB;  Service: Cardiovascular;  Laterality: N/A;   PERIPHERAL VASCULAR CATHETERIZATION Bilateral 06/30/2016   Procedure: Lower Extremity Angiography;   Surgeon: Elam Dutch, MD;  Location: [place] CV LAB;  Service: Cardiovascular;  Laterality: Bilateral;   TONSILLECTOMY       OB History   No obstetric history on file.      Home Medications    Prior to Admission medications   Medication Sig Start Date End Date Taking? Authorizing Provider  amLODipine (NORVASC) 5 MG tablet Take 5 mg by mouth daily.  10/24/16  Yes [provider]  aspirin EC 81 MG tablet Take 81 mg by mouth at bedtime.    Yes [provider]  bismuth subsalicylate (PEPTO BISMOL) 262 MG/15ML suspension Take 30 mLs by mouth every 6 (six) hours as needed for indigestion or diarrhea or loose stools. Reported on 11/04/2015   Yes [provider]  gabapentin (NEURONTIN) 300 MG capsule TAKE 1 CAPSULE BY MOUTH THREE TIMES A DAY Patient taking differently: Take 300 mg by mouth 3 (three) times daily.  08/18/19  Yes Elam Dutch, MD  metFORMIN (GLUCOPHAGE) 1000 MG tablet Take 1 tablet (1,000 mg total) by mouth 2 (two) times daily with a meal. 08/04/19  Yes Renato Shin, MD  metoprolol succinate (  TOPROL-XL) 100 MG 24 hr tablet Take 100 mg by mouth daily.  01/10/17  Yes [provider]  Netarsudil-Latanoprost (ROCKLATAN) 0.02-0.005 % SOLN Apply to eye.   Yes [provider]  pantoprazole (PROTONIX) 40 MG tablet Take 40 mg by mouth daily. 10/25/15  Yes [provider]  PROAIR HFA 108 (90 BASE) MCG/ACT inhaler Inhale 2 puffs into the lungs every 4 (four) hours as needed for wheezing or shortness of breath. Reported on 11/04/2015 09/02/13  Yes [provider]  repaglinide (PRANDIN) 1 MG tablet Take 1 tablet (1 mg total) by mouth 2 (two) times daily before a meal. 06/03/19  Yes Renato Shin, MD  temazepam (RESTORIL) 30 MG capsule Take 30 mg by mouth at bedtime as needed for sleep. 04/15/18  Yes [provider]  Accu-Chek Softclix Lancets lancets 1 each by Other route 3 (three) times daily. Use to monitor glucose  levels TID; E11.9    [provider]  glucose blood (ACCU-CHEK AVIVA PLUS) test strip 1 each by Other route 3 (three) times daily. 06/03/19   Renato Shin, MD  lisinopril (ZESTRIL) 5 MG tablet Take 5 mg by mouth daily. 08/11/19   [provider]  lovastatin (MEVACOR) 20 MG tablet Take 20 mg by mouth every evening.     [provider]  methocarbamol (ROBAXIN) 500 MG tablet Take 1 tablet (500 mg total) by mouth 2 (two) times daily. 03/09/18   Doristine Devoid, PA-C  OVER THE COUNTER MEDICATION Apply 1 patch topically daily as needed (pain). Apply to right shoulder    [provider]    Family History Family History  Problem Relation Age of Onset   Heart disease Father    Heart attack Father        MI at age 8   Hypertension Mother    Alzheimer's disease Mother    Hypertension Sister    Diabetes Brother    Hyperlipidemia Brother    Hypertension Brother    Heart disease Brother    Heart attack Brother    Breast cancer Maternal Aunt     Social History Social History   Tobacco Use   Smoking status: Former Smoker    Packs/day: 0.10    Types: Cigarettes    Quit date: 10/2014    Years since quitting: 4.8   Smokeless tobacco: Never Used   Tobacco comment: pt states that she is using the E cig only   Substance Use Topics   Alcohol use: Yes    Alcohol/week: 0.0 - 1.0 standard drinks    Comment: Occasional   Drug use: No     Allergies   Adhesive [tape]   Review of Systems Review of Systems  Constitutional: Negative for fever.  HENT: Negative for rhinorrhea and sore throat.   Eyes: Negative for redness.  Respiratory: Positive for chest tightness. Negative for cough.   Cardiovascular: Negative for chest pain.  Gastrointestinal: Positive for abdominal pain. Negative for diarrhea, nausea and vomiting.  Genitourinary: Negative for dysuria.  Musculoskeletal: Negative for myalgias.  Skin: Negative for rash.  Neurological:  Negative for headaches.     Physical Exam Updated Vital Signs BP 120/85    Pulse 63    Temp 98.3 F (36.8 C)    Resp 17    SpO2 99%   Physical Exam Vitals signs and nursing note reviewed.  Constitutional:      Appearance: She is well-developed.  HENT:     Head: Normocephalic and atraumatic.  Eyes:  General:        Right eye: No discharge.        Left eye: No discharge.     Conjunctiva/sclera: Conjunctivae normal.  Neck:     Musculoskeletal: Normal range of motion and neck supple.  Cardiovascular:     Rate and Rhythm: Normal rate and regular rhythm.     Heart sounds: Normal heart sounds.  Pulmonary:     Effort: Pulmonary effort is normal.     Breath sounds: Normal breath sounds.  Abdominal:     Palpations: Abdomen is soft.     Tenderness: There is generalized abdominal tenderness. There is no guarding or rebound. Negative signs include Murphy's sign and Rovsing's sign.     Hernia: No hernia is present.     Comments: Patient with generalized abdominal pain, seems to be somewhat worse in the epigastric area with right upper quadrant and left lower quadrant on my exam.  No rebound or guarding.  Skin:    General: Skin is warm and dry.  Neurological:     Mental Status: She is alert.      ED Treatments / Results  Labs (all labs ordered are listed, but only abnormal results are displayed) Labs Reviewed  LIPASE, BLOOD - Abnormal; Notable for the following components:      Result Value   Lipase 87 (*)    All other components within normal limits  COMPREHENSIVE METABOLIC PANEL - Abnormal; Notable for the following components:   Glucose, Bld 177 (*)    All other components within normal limits  URINALYSIS, ROUTINE W REFLEX MICROSCOPIC - Abnormal; Notable for the following components:   Color, Urine AMBER (*)    APPearance CLOUDY (*)    Bilirubin Urine SMALL (*)    Protein, ur 30 (*)    Bacteria, UA RARE (*)    All other components within normal limits  CBC  LACTIC  ACID, PLASMA    ED ECG REPORT   Date: 08/23/2019  Rate: 53  Rhythm: sinus bradycardia  QRS Axis: normal  Intervals: normal  ST/T Wave abnormalities: normal  Conduction Disutrbances:none  Narrative Interpretation:   Old EKG Reviewed: unchanged from 05/10/18  I have personally reviewed the EKG tracing and agree with the computerized printout as noted.  Radiology Ct Abdomen Pelvis W Contrast  Result Date: 08/22/2019 CLINICAL DATA:  Acute generalized abdominal pain for 4 days EXAM: CT ABDOMEN AND PELVIS WITH CONTRAST TECHNIQUE: Multidetector CT imaging of the abdomen and pelvis was performed using the standard protocol following bolus administration of intravenous contrast. CONTRAST:  160mL OMNIPAQUE IOHEXOL 300 MG/ML  SOLN COMPARISON:  CT Apr 09, 2015, abdominal radiograph 06/05/2018 FINDINGS: Lower chest: Lung bases are clear. Normal heart size. No pericardial effusion. Hepatobiliary: No focal liver abnormality is seen. No gallstones, gallbladder wall thickening, or biliary dilatation. Pancreas: Question a small amount of peripancreatic stranding near the head neck junction with trace adjacent fluid in the lesser sac (sagittal 6/111) and several reactive appearing lymph nodes in the retroperitoneum. Minimal if any edematous inflammation of the pancreatic parenchyma is seen however. No hypoattenuation. No biliary ductal dilatation. Spleen: Normal in size without focal abnormality. Adrenals/Urinary Tract: Adrenal glands are unremarkable. Kidneys are normal, without renal calculi, focal lesion, or hydronephrosis. Urinary bladder is largely decompressed at the time of exam and therefore poorly evaluated by CT imaging. Stomach/Bowel: Distal esophagus is unremarkable. There is mild circumferential thickening of the gastric antrum which could be related to peristaltic contraction versus inflammation given adjacent fluid  in the lesser sac. No extraluminal gas. Duodenal sweep takes a normal course. No small  bowel dilatation or wall thickening. A normal appendix is visualized. Proximal colon is unremarkable. There is segmental thickening of the distal colon from the level of the descending colon to the rectosigmoid and stretch colon demonstrating multiple colonic diverticula. No significant pericolonic inflammation is present. Vascular/Lymphatic: Atherosclerotic plaque within the normal caliber aorta. Reactive upper abdominal and retroperitoneal lymph nodes. Reproductive: Normal appearance of the uterus and adnexal structures. Other: Postsurgical changes in the right groin possibly related to prior vascular access or inguinal repair. No bowel containing hernia. Small volume fluid in the lesser sac. No other free abdominopelvic fluid or gas. Musculoskeletal: No acute osseous abnormality or suspicious osseous lesion. Multilevel degenerative changes are present in the imaged portions of the spine. Additional degenerative changes in the SI joints and hips. IMPRESSION: 1. Mild hazy hazy stranding in the space between the pancreatic head neck junction and gastric antrum with small volume fluid in the lesser. Mild thickening of the gastric antrum is noted as well. Several adjacent reactive lymph nodes are also visualized. Findings could reflect a mild pancreatitis given slight elevation the patient's lipase versus a gastritis with reactive inflammation of pancreas. Correlate with patient's symptoms. 2. Mild segmental thickening of the colon without acute peripancreatic inflammation seen from the the distal descending to the rectosigmoid in a region of multiple colonic diverticula may reflect sequela of prior inflammation. Consider outpatient evaluation colonoscopy to exclude underlying mass. 3. Aortic Atherosclerosis (ICD10-I70.0). These results were called by telephone at the time of interpretation on 08/22/2019 at 11:11 pm to provider [facility] , who verbally acknowledged these results. Electronically Signed   By: Lovena Le M.D.   On: 08/22/2019 23:12    Procedures Procedures (including critical care time)  Medications Ordered in ED Medications  morphine 4 MG/ML injection 4 mg (4 mg Intravenous Given 08/22/19 2232)  ondansetron (ZOFRAN) injection 4 mg (4 mg Intravenous Given 08/22/19 2230)  sodium chloride 0.9 % bolus 500 mL (0 mLs Intravenous Stopped 08/23/19 0008)  iohexol (OMNIPAQUE) 300 MG/ML solution 100 mL (100 mLs Intravenous Contrast Given 08/22/19 2238)     Initial Impression / Assessment and Plan / ED Course  I have reviewed the triage vital signs and the nursing notes.  Pertinent labs & imaging results that were available during my care of the patient were reviewed by me and considered in my medical decision making (see chart for details).        Patient seen and examined. Work-up initiated.  Overall lab work is reassuring however patient does have history of vascular disease and has concerning abdominal pain that is worse than what I would typically expect for reflux.  CT imaging ordered to evaluate for infection, signs of ischemia.  Patient discussed with Dr. Francia Greaves.   Vital signs reviewed and are as follows: BP 120/85    Pulse 63    Temp 98.3 F (36.8 C)    Resp 17    SpO2 99%   CT personally reviewed.  I discussed the results with the radiologist by telephone.  Patient has inflammation around the stomach and pancreas.  I suspect that given the patient's history of reflux and GERD that she has gastritis that is impinging upon her pancreas.  Patient does not have other strong signs of pancreatitis and her lipase is only mildly elevated.  Patient is not vomiting and her pain is well controlled while in the emergency department.  We also discussed her colon findings and need to follow-up with GI for all of these abnormalities.   Patient is comfortable discharged home.  She is already taking Protonix regularly.  Will add Pepcid, Carafate in the near term.  Patient will also be given a  prescription for Vicodin.  She has taken this in the past year.  Use pain medication only under direct supervision at the lowest possible dose needed to control your pain. Patient counseled on use of narcotic pain medications. Counseled not to combine these medications with others containing tylenol. Urged not to drink alcohol, drive, or perform any other activities that requires focus while taking these medications. The patient verbalizes understanding and agrees with the plan.   Encouraged that the patient call her PCP and GI physician on Monday (today is Friday).   The patient was urged to return to the Emergency Department immediately with worsening of current symptoms, worsening abdominal pain, persistent vomiting, blood noted in stools, fever, or any other concerns. The patient verbalized understanding.     Final Clinical Impressions(s) / ED Diagnoses   Final diagnoses:  Epigastric pain  Generalized abdominal pain   Patient with abdominal pain, mainly upper but some lower abd tenderness as well.  Patient currently under treatment for GERD.  Vitals are stable, no fever. Labs overall reassuring with mildly elevated lipase, normal white blood cell count, lactic acid normal. Imaging CT with some mild signs of inflammation around the stomach and pancreas.  Clinically this is most consistent with gastritis.  No signs of perforation.  No signs of dehydration, patient is tolerating PO's.  Pain and nausea are controlled in the emergency department and I feel that she is appropriate for discharge to home.  EKG reviewed without signs of ischemia.  Lungs are clear and no signs suggestive of PNA. Low concern for appendicitis, cholecystitis, pancreatitis, ruptured viscus, UTI, kidney stone, aortic dissection, aortic aneurysm or other emergent abdominal etiology. Supportive therapy indicated with return if symptoms worsen.    ED Discharge Orders         Ordered    sucralfate (CARAFATE) 1 GM/10ML  suspension  3 times daily with meals & bedtime     08/22/19 2356    famotidine (PEPCID) 20 MG tablet  2 times daily     08/22/19 2356    HYDROcodone-acetaminophen (NORCO/VICODIN) 5-325 MG tablet     08/22/19 2356           Carlisle Cater, PA-C 08/23/19 0032    Valarie Merino, MD 08/23/19 2350

## 2019-08-23 NOTE — Discharge Instructions (Signed)
Please read and follow all provided instructions.  Your diagnoses today include:  1. Epigastric pain   2. Generalized abdominal pain    Tests performed today include:  Blood counts and electrolytes  Blood tests to check liver and kidney function  Blood tests to check pancreas function - slightly high  Urine test to look for infection  CT scan of your abdomen - shows inflammation around her stomach and around the pancreas  Vital signs. See below for your results today.   Medications prescribed:   Pepcid (famotidine) - antihistamine  You can find this medication over-the-counter.   DO NOT exceed:   20mg  Pepcid every 12 hours   Carafate - for stomach upset and to protect your stomach   Vicodin (hydrocodone/acetaminophen) - narcotic pain medication  DO NOT drive or perform any activities that require you to be awake and alert because this medicine can make you drowsy. BE VERY CAREFUL not to take multiple medicines containing Tylenol (also called acetaminophen). Doing so can lead to an overdose which can damage your liver and cause liver failure and possibly death.  Take any prescribed medications only as directed.  Home care instructions:   Follow any educational materials contained in this packet.  Follow-up instructions: Please follow-up with your primary care provider in the next 3 days for further evaluation of your symptoms.  You should also see your gastroenterologist to discuss your CT findings.  Return instructions:  SEEK IMMEDIATE MEDICAL ATTENTION IF:  The pain does not go away or becomes severe   A temperature above 101F develops   Repeated vomiting occurs (multiple episodes)   The pain becomes localized to portions of the abdomen. The right side could possibly be appendicitis. In an adult, the left lower portion of the abdomen could be colitis or diverticulitis.   Blood is being passed in stools or vomit (bright red or black tarry stools)   You  develop chest pain, difficulty breathing, dizziness or fainting, or become confused, poorly responsive, or inconsolable (young children)  If you have any other emergent concerns regarding your health  Additional Information: Abdominal (belly) pain can be caused by many things. Your caregiver performed an examination and possibly ordered blood/urine tests and imaging (CT scan, x-rays, ultrasound). Many cases can be observed and treated at home after initial evaluation in the emergency department. Even though you are being discharged home, abdominal pain can be unpredictable. Therefore, you need a repeated exam if your pain does not resolve, returns, or worsens. Most patients with abdominal pain don't have to be admitted to the hospital or have surgery, but serious problems like appendicitis and gallbladder attacks can start out as nonspecific pain. Many abdominal conditions cannot be diagnosed in one visit, so follow-up evaluations are very important.  Your vital signs today were: BP 122/64 (BP Location: Left Arm)    Pulse (!) 51    Temp 97.6 F (36.4 C) (Oral)    Resp 18    SpO2 96%  If your blood pressure (bp) was elevated above 135/85 this visit, please have this repeated by your doctor within one month. --------------

## 2019-08-26 ENCOUNTER — Ambulatory Visit: Payer: Medicare Other

## 2019-09-01 ENCOUNTER — Encounter (HOSPITAL_COMMUNITY): Payer: Self-pay | Admitting: Emergency Medicine

## 2019-09-01 ENCOUNTER — Other Ambulatory Visit: Payer: Self-pay

## 2019-09-01 ENCOUNTER — Emergency Department (HOSPITAL_COMMUNITY): Payer: Medicare Other

## 2019-09-01 ENCOUNTER — Inpatient Hospital Stay (HOSPITAL_COMMUNITY)
Admission: EM | Admit: 2019-09-01 | Discharge: 2019-09-04 | DRG: 440 | Disposition: A | Discharge status: Home / Self Care | Payer: Medicare Other | Attending: Internal Medicine | Admitting: Internal Medicine

## 2019-09-01 DIAGNOSIS — Z95828 Presence of other vascular implants and grafts: Secondary | ICD-10-CM

## 2019-09-01 DIAGNOSIS — R52 Pain, unspecified: Secondary | ICD-10-CM

## 2019-09-01 DIAGNOSIS — Z91048 Other nonmedicinal substance allergy status: Secondary | ICD-10-CM

## 2019-09-01 DIAGNOSIS — F418 Other specified anxiety disorders: Secondary | ICD-10-CM | POA: Diagnosis present

## 2019-09-01 DIAGNOSIS — I10 Essential (primary) hypertension: Secondary | ICD-10-CM | POA: Diagnosis present

## 2019-09-01 DIAGNOSIS — K861 Other chronic pancreatitis: Secondary | ICD-10-CM | POA: Diagnosis present

## 2019-09-01 DIAGNOSIS — K859 Acute pancreatitis without necrosis or infection, unspecified: Principal | ICD-10-CM | POA: Diagnosis present

## 2019-09-01 DIAGNOSIS — Z79891 Long term (current) use of opiate analgesic: Secondary | ICD-10-CM

## 2019-09-01 DIAGNOSIS — Z79899 Other long term (current) drug therapy: Secondary | ICD-10-CM

## 2019-09-01 DIAGNOSIS — M797 Fibromyalgia: Secondary | ICD-10-CM | POA: Diagnosis present

## 2019-09-01 DIAGNOSIS — G4730 Sleep apnea, unspecified: Secondary | ICD-10-CM | POA: Diagnosis present

## 2019-09-01 DIAGNOSIS — Z833 Family history of diabetes mellitus: Secondary | ICD-10-CM

## 2019-09-01 DIAGNOSIS — K219 Gastro-esophageal reflux disease without esophagitis: Secondary | ICD-10-CM | POA: Diagnosis present

## 2019-09-01 DIAGNOSIS — E785 Hyperlipidemia, unspecified: Secondary | ICD-10-CM | POA: Diagnosis present

## 2019-09-01 DIAGNOSIS — E1142 Type 2 diabetes mellitus with diabetic polyneuropathy: Secondary | ICD-10-CM | POA: Diagnosis present

## 2019-09-01 DIAGNOSIS — Z8249 Family history of ischemic heart disease and other diseases of the circulatory system: Secondary | ICD-10-CM

## 2019-09-01 DIAGNOSIS — Z7984 Long term (current) use of oral hypoglycemic drugs: Secondary | ICD-10-CM

## 2019-09-01 DIAGNOSIS — E1159 Type 2 diabetes mellitus with other circulatory complications: Secondary | ICD-10-CM | POA: Diagnosis present

## 2019-09-01 DIAGNOSIS — E1151 Type 2 diabetes mellitus with diabetic peripheral angiopathy without gangrene: Secondary | ICD-10-CM | POA: Diagnosis present

## 2019-09-01 DIAGNOSIS — H409 Unspecified glaucoma: Secondary | ICD-10-CM | POA: Diagnosis present

## 2019-09-01 DIAGNOSIS — Z87891 Personal history of nicotine dependence: Secondary | ICD-10-CM

## 2019-09-01 DIAGNOSIS — R1013 Epigastric pain: Secondary | ICD-10-CM | POA: Diagnosis not present

## 2019-09-01 DIAGNOSIS — I739 Peripheral vascular disease, unspecified: Secondary | ICD-10-CM | POA: Diagnosis present

## 2019-09-01 DIAGNOSIS — Z20828 Contact with and (suspected) exposure to other viral communicable diseases: Secondary | ICD-10-CM | POA: Diagnosis present

## 2019-09-01 DIAGNOSIS — Z7982 Long term (current) use of aspirin: Secondary | ICD-10-CM

## 2019-09-01 LAB — CBC
HCT: 42.4 % (ref 36.0–46.0)
Hemoglobin: 13.9 g/dL (ref 12.0–15.0)
MCH: 30.6 pg (ref 26.0–34.0)
MCHC: 32.8 g/dL (ref 30.0–36.0)
MCV: 93.4 fL (ref 80.0–100.0)
Platelets: 379 10*3/uL (ref 150–400)
RBC: 4.54 MIL/uL (ref 3.87–5.11)
RDW: 12.4 % (ref 11.5–15.5)
WBC: 7.9 10*3/uL (ref 4.0–10.5)
nRBC: 0 % (ref 0.0–0.2)

## 2019-09-01 LAB — HEPATIC FUNCTION PANEL
ALT: 18 U/L (ref 0–44)
AST: 17 U/L (ref 15–41)
Albumin: 4 g/dL (ref 3.5–5.0)
Alkaline Phosphatase: 63 U/L (ref 38–126)
Bilirubin, Direct: 0.1 mg/dL (ref 0.0–0.2)
Indirect Bilirubin: 0.3 mg/dL (ref 0.3–0.9)
Total Bilirubin: 0.4 mg/dL (ref 0.3–1.2)
Total Protein: 7.8 g/dL (ref 6.5–8.1)

## 2019-09-01 LAB — BASIC METABOLIC PANEL
Anion gap: 13 (ref 5–15)
BUN: 10 mg/dL (ref 8–23)
CO2: 23 mmol/L (ref 22–32)
Calcium: 9.8 mg/dL (ref 8.9–10.3)
Chloride: 100 mmol/L (ref 98–111)
Creatinine, Ser: 0.65 mg/dL (ref 0.44–1.00)
GFR calc Af Amer: >60 mL/min (ref >60)
GFR calc non Af Amer: >60 mL/min (ref >60)
Glucose, Bld: 160 mg/dL — ABNORMAL HIGH (ref 70–99)
Potassium: 3.8 mmol/L (ref 3.5–5.1)
Sodium: 136 mmol/L (ref 135–145)

## 2019-09-01 LAB — LIPASE, BLOOD: Lipase: 63 U/L — ABNORMAL HIGH (ref 11–51)

## 2019-09-01 LAB — TROPONIN I (HIGH SENSITIVITY): Troponin I (High Sensitivity): 3 ng/L (ref <18)

## 2019-09-01 MED ORDER — SODIUM CHLORIDE 0.9% FLUSH: 3.0000 mL | Freq: Once | INTRAVENOUS | Status: DC

## 2019-09-01 MED ORDER — IOHEXOL 300 MG/ML  SOLN
100.0000 mL | Freq: Once | INTRAMUSCULAR | Status: AC | PRN
  Administered 2019-09-01: 100 mL via INTRAVENOUS

## 2019-09-01 MED ORDER — MORPHINE SULFATE (PF) 4 MG/ML IV SOLN
4.0000 mg | Freq: Once | INTRAVENOUS | Status: AC
  Administered 2019-09-01: 4 mg via INTRAVENOUS
  Filled 2019-09-01: qty 1

## 2019-09-01 MED ORDER — ONDANSETRON HCL 4 MG/2ML IJ SOLN
4.0000 mg | Freq: Once | INTRAMUSCULAR | Status: AC
  Administered 2019-09-01: 4 mg via INTRAVENOUS
  Filled 2019-09-01: qty 2

## 2019-09-01 MED ORDER — PANTOPRAZOLE SODIUM 40 MG IV SOLR
40.0000 mg | Freq: Once | INTRAVENOUS | Status: AC
  Administered 2019-09-01: 23:00:00 40 mg via INTRAVENOUS
  Filled 2019-09-01: qty 40

## 2019-09-01 MED ORDER — SODIUM CHLORIDE (PF) 0.9 % IJ SOLN
INTRAMUSCULAR | Status: AC
  Filled 2019-09-01: qty 50

## 2019-09-01 MED ORDER — ALUM & MAG HYDROXIDE-SIMETH 200-200-20 MG/5ML PO SUSP
30.0000 mL | Freq: Once | ORAL | Status: AC
  Administered 2019-09-01: 30 mL via ORAL
  Filled 2019-09-01: qty 30

## 2019-09-01 NOTE — ED Provider Notes (Signed)
[facility] DEPT Provider Note   CSN: [id number] Arrival date & time: 09/01/19  1657     History   Chief Complaint Chief Complaint  Patient presents with  . Abdominal Pain    HPI Jill Higgins is a 73 y.o. female w PMHx T2DM, PVD, fibromyalgia, HTN, HLD, GERD, presenting to the ED for second visit regarding abdominal pain.  Patient was evaluated on 08/23/2019 for upper abdominal pain.  She had a CT scan done during that visit which revealed some thickening of the gastric antrum and some stranding between the pancreas and the stomach.  She also had some mild segmental thickening of the colon.  Her lipase was slightly elevated to 87.  Her symptoms were ultimately managed in the ED and she was discharged with symptomatic management including instruction to continue taking her PPI, as well as prescription for Pepcid and Carafate and Vicodin.  She was given a GI referral though has been unable to follow-up with them yet.  Her symptoms are described as constant, waxing and waning, epigastric abdominal pain radiating to her back.  She has associated symptoms of acid reflux and belching that radiates from her stomach up into her lower chest.  She states her appetite is decreased.  She states the medications since her recent ED discharge have not been providing relief.  She denies associated nausea, vomiting, diarrhea or constipation.  No fevers or urinary symptoms.  No chest pain or shortness of breath.     The history is provided by the patient and medical records.    Past Medical History:  Diagnosis Date  . Bronchitis   . Bulging disc    in neck  . Depression with anxiety   . Diabetes mellitus    Type II  . Fibromyalgia    pt. denies  . GERD (gastroesophageal reflux disease)   . Glaucoma   . History of bronchitis   . Hyperlipidemia   . Hypertension   . Overactive bladder   . Peripheral vascular disease ([place])   . Pneumonia   . Sleep apnea    recent  test negative for sleep apnea  . Tobacco abuse   . Urinary frequency     Patient Active Problem List   Diagnosis Date Noted  . Diabetes ([place]) 05/23/2018  . Other spondylosis with radiculopathy, cervical region 03/05/2018  . Foraminal stenosis of cervical region 09/11/2017  . Partial tear of right rotator cuff 04/04/2017  . Aftercare following surgery of the circulatory system 02/23/2016  . S/P femoropopliteal bypass surgery 02/23/2016  . PAD (peripheral artery disease) ([place]) 02/08/2016  . Leg edema, left 12/18/2013  . Aftercare following surgery of the circulatory system, [place] 11/19/2013  . Ischemic leg 10/21/2013  . Peripheral vascular disease, unspecified ([place]) 09/18/2013  . Preop cardiovascular exam 09/08/2013  . Hypertension   . Hyperlipidemia   . Peripheral vascular disease ([place])   . Fibromyalgia   . GERD (gastroesophageal reflux disease)   . Depression with anxiety   . Pain in limb 04/03/2013  . Atherosclerosis of native artery of extremity with intermittent claudication ([place]) 08/01/2012  . Claudication ([place]) 12/28/2011    Past Surgical History:  Procedure Laterality Date  . ABDOMINAL AORTAGRAM N/A 08/15/2013   Procedure: ABDOMINAL Maxcine Ham;  Surgeon: Elam Dutch, MD;  Location: [facility] CATH LAB;  Service: Cardiovascular;  Laterality: N/A;  . ABDOMINAL AORTAGRAM N/A 06/19/2014   Procedure: ABDOMINAL Maxcine Ham;  Surgeon: Elam Dutch, MD;  Location: [facility] CATH LAB;  Service:  Cardiovascular;  Laterality: N/A;  . ABDOMINAL AORTOGRAM W/LOWER EXTREMITY N/A 05/10/2018   Procedure: ABDOMINAL AORTOGRAM W/LOWER EXTREMITY;  Surgeon: Elam Dutch, MD;  Location: [facility] CV LAB;  Service: Cardiovascular;  Laterality: N/A;  bilateral  . Aortogram w/ PTA  05/14/08,  11-04-10   Bilateral aortogram w/ bilateral  SFA PTA  stenting   . BREAST SURGERY Right    boil removal  . BUNIONECTOMY     L foot in the 1980s  . COLONOSCOPY     2014  . DILATION AND CURETTAGE OF UTERUS      X4  . ENDARTERECTOMY FEMORAL Right 06/03/2018   Procedure: RIGHT FEMORAL ENDARTERECTOMY;  Surgeon: Elam Dutch, MD;  Location: [facility] OR;  Service: Vascular;  Laterality: Right;  . Epidural shots in neck     . EYE SURGERY Bilateral 2016   cataract removal  . FEMORAL-POPLITEAL BYPASS GRAFT Left 10/21/2013   Procedure: LEFT FEMORAL-POPLITEAL ARTERY BYPASS WITH SAPHENOUS VEIN GRAFT , POPLITEAL ENDARTERECTOMY ,INTRAOPERATIVE ARTERIOGRAM, vein patch angioplasty to popliteal artery;  Surgeon: Elam Dutch, MD;  Location: [facility] OR;  Service: Vascular;  Laterality: Left;  . FEMORAL-POPLITEAL BYPASS GRAFT Right 02/08/2016   Procedure: Right FEMORAL- to Above Knee POPLITEAL ARTERY Bypass Graft with reversed saphenous vein and Common Femoral Endarterectomy  with profundoplasty;  Surgeon: Elam Dutch, MD;  Location: [facility];  Service: Vascular;  Laterality: Right;  . GROIN DEBRIDEMENT Left 10/28/2013   Procedure: left inner thigh DEBRIDEMENT;  Surgeon: Elam Dutch, MD;  Location: [facility];  Service: Vascular;  Laterality: Left;  . HAND SURGERY Left 2010  . LOWER EXTREMITY ANGIOGRAM Bilateral 12/24/2015   Procedure: Lower Extremity Angiogram;  Surgeon: Elam Dutch, MD;  Location: [facility] CV LAB;  Service: Cardiovascular;  Laterality: Bilateral;  . PATCH ANGIOPLASTY Right 06/03/2018   Procedure: PATCH ANGIOPLASTY USING A XENOSURE 1CM X 14CM BIOLOGIC PATCH;  Surgeon: Elam Dutch, MD;  Location: [facility];  Service: Vascular;  Laterality: Right;  . PERIPHERAL VASCULAR CATHETERIZATION N/A 12/24/2015   Procedure: Abdominal Aortogram;  Surgeon: Elam Dutch, MD;  Location: [facility] CV LAB;  Service: Cardiovascular;  Laterality: N/A;  . PERIPHERAL VASCULAR CATHETERIZATION Left 12/24/2015   Procedure: Peripheral Vascular Balloon Angioplasty;  Surgeon: Elam Dutch, MD;  Location: [facility] CV LAB;  Service: Cardiovascular;  Laterality: Left;  drug coated balloon  . PERIPHERAL VASCULAR  CATHETERIZATION N/A 06/30/2016   Procedure: Abdominal Aortogram;  Surgeon: Elam Dutch, MD;  Location: [facility] CV LAB;  Service: Cardiovascular;  Laterality: N/A;  . PERIPHERAL VASCULAR CATHETERIZATION Bilateral 06/30/2016   Procedure: Lower Extremity Angiography;  Surgeon: Elam Dutch, MD;  Location: [facility] CV LAB;  Service: Cardiovascular;  Laterality: Bilateral;  . TONSILLECTOMY       OB History   No obstetric history on file.      Home Medications    Prior to Admission medications   Medication Sig Start Date End Date Taking? Authorizing Provider  Accu-Chek Softclix Lancets lancets 1 each by Other route 3 (three) times daily. Use to monitor glucose levels TID; E11.9    [provider]  amLODipine (NORVASC) 5 MG tablet Take 5 mg by mouth daily.  10/24/16   [provider]  aspirin EC 81 MG tablet Take 81 mg by mouth at bedtime.     [provider]  bismuth subsalicylate (PEPTO BISMOL) 262 MG/15ML suspension Take 30 mLs by mouth every 6 (six) hours as needed for indigestion  or diarrhea or loose stools. Reported on 11/04/2015    [provider]  famotidine (PEPCID) 20 MG tablet Take 1 tablet (20 mg total) by mouth 2 (two) times daily. 08/22/19   Carlisle Cater, PA-C  gabapentin (NEURONTIN) 300 MG capsule TAKE 1 CAPSULE BY MOUTH THREE TIMES A DAY Patient taking differently: Take 300 mg by mouth 3 (three) times daily.  08/18/19   Elam Dutch, MD  glucose blood (ACCU-CHEK AVIVA PLUS) test strip 1 each by Other route 3 (three) times daily. 06/03/19   Renato Shin, MD  HYDROcodone-acetaminophen (NORCO/VICODIN) 5-325 MG tablet Take 0.5-1 tablets every 6 hours as needed for severe pain 08/22/19   Carlisle Cater, PA-C  lisinopril (ZESTRIL) 5 MG tablet Take 5 mg by mouth daily. 08/11/19   [provider]  lovastatin (MEVACOR) 20 MG tablet Take 20 mg by mouth every evening.     [provider]  metFORMIN (GLUCOPHAGE) 1000 MG  tablet Take 1 tablet (1,000 mg total) by mouth 2 (two) times daily with a meal. 08/04/19   Renato Shin, MD  methocarbamol (ROBAXIN) 500 MG tablet Take 1 tablet (500 mg total) by mouth 2 (two) times daily. 03/09/18   Doristine Devoid, PA-C  metoprolol succinate (TOPROL-XL) 100 MG 24 hr tablet Take 100 mg by mouth daily.  01/10/17   [provider]  Netarsudil-Latanoprost (ROCKLATAN) 0.02-0.005 % SOLN Apply to eye.    [provider]  OVER THE COUNTER MEDICATION Apply 1 patch topically daily as needed (pain). Apply to right shoulder    [provider]  pantoprazole (PROTONIX) 40 MG tablet Take 40 mg by mouth daily. 10/25/15   [provider]  PROAIR HFA 108 (90 BASE) MCG/ACT inhaler Inhale 2 puffs into the lungs every 4 (four) hours as needed for wheezing or shortness of breath. Reported on 11/04/2015 09/02/13   [provider]  repaglinide (PRANDIN) 1 MG tablet Take 1 tablet (1 mg total) by mouth 2 (two) times daily before a meal. 06/03/19   Renato Shin, MD  sucralfate (CARAFATE) 1 GM/10ML suspension Take 10 mLs (1 g total) by mouth 4 (four) times daily -  with meals and at bedtime. 08/22/19   Carlisle Cater, PA-C  temazepam (RESTORIL) 30 MG capsule Take 30 mg by mouth at bedtime as needed for sleep. 04/15/18   [provider]    Family History Family History  Problem Relation Age of Onset  . Heart disease Father   . Heart attack Father        MI at age 62  . Hypertension Mother   . Alzheimer's disease Mother   . Hypertension Sister   . Diabetes Brother   . Hyperlipidemia Brother   . Hypertension Brother   . Heart disease Brother   . Heart attack Brother   . Breast cancer Maternal Aunt     Social History Social History   Tobacco Use  . Smoking status: Former Smoker    Packs/day: 0.10    Types: Cigarettes    Quit date: 10/2014    Years since quitting: 4.8  . Smokeless tobacco: Never Used  . Tobacco comment: pt states that she  is using the E cig only   Substance Use Topics  . Alcohol use: Yes    Alcohol/week: 0.0 - 1.0 standard drinks    Comment: Occasional  . Drug use: No     Allergies   Adhesive [tape]   Review of Systems Review of Systems  Constitutional: Positive for  appetite change.  Gastrointestinal: Positive for abdominal pain.  All other systems reviewed and are negative.    Physical Exam Updated Vital Signs BP (!) 137/111 (BP Location: Right Arm)   Pulse 68   Temp 98.2 F (36.8 C) (Oral)   Resp 16   SpO2 96%   Physical Exam Vitals signs and nursing note reviewed.  Constitutional:      General: She is not in acute distress.    Appearance: She is well-developed.  HENT:     Head: Normocephalic and atraumatic.  Eyes:     Conjunctiva/sclera: Conjunctivae normal.  Cardiovascular:     Rate and Rhythm: Normal rate and regular rhythm.  Pulmonary:     Effort: Pulmonary effort is normal. No respiratory distress.     Breath sounds: Normal breath sounds.  Abdominal:     General: Bowel sounds are normal.     Palpations: Abdomen is soft.     Tenderness: There is abdominal tenderness in the epigastric area. There is no guarding or rebound.  Skin:    General: Skin is warm.  Neurological:     Mental Status: She is alert.  Psychiatric:        Behavior: Behavior normal.      ED Treatments / Results  Labs (all labs ordered are listed, but only abnormal results are displayed) Labs Reviewed  CBC  BASIC METABOLIC PANEL  LIPASE, BLOOD  HEPATIC FUNCTION PANEL  TROPONIN I (HIGH SENSITIVITY)    EKG None  Radiology Dg Chest 2 View  Result Date: 09/01/2019 CLINICAL DATA:  Chest pain. EXAM: CHEST - 2 VIEW COMPARISON:  06/05/2018 FINDINGS: The heart size and mediastinal contours are within normal limits. Both lungs are clear. The visualized skeletal structures are unremarkable. IMPRESSION: No active cardiopulmonary disease. Electronically Signed   By: Kerby Moors M.D.   On:  09/01/2019 18:16    Procedures Procedures (including critical care time)  Medications Ordered in ED Medications  sodium chloride flush (NS) 0.9 % injection 3 mL (has no administration in time range)  morphine 4 MG/ML injection 4 mg (has no administration in time range)  ondansetron (ZOFRAN) injection 4 mg (has no administration in time range)     Initial Impression / Assessment and Plan / ED Course  I have reviewed the triage vital signs and the nursing notes.  Pertinent labs & imaging results that were available during my care of the patient were reviewed by me and considered in my medical decision making (see chart for details).  Clinical Course as of Sep 01 21  Mon Sep 01, 2019  2146 Pt re-evaluated, reports pain was completely resolved but may be slightly returning. Discussed reassuring results. Will provide maalox to help soothe her stomach. Sx seem most consistent with gastritis vs PUD. Lipase downtrending, less likely pancreatitis.    [JR]  [id number] Pt evaluated once more, pain persisting and worsening. Discussed with Dr. Lacinda Axon. Will order repeat CT imaging, protonix and redose pain medication.   [JR]    Clinical Course User Index [JR] Kohler Pellerito, [place] N, PA-C       Patient presenting for subsequent visit regarding epigastric abdominal pain.  Symptoms have been ongoing for 2 weeks now, last evaluated on 08/22/2019 in the ED.  She was discharged with symptomatic management for suspected gastritis, including Carafate, Pepcid, her home dose of PPI and Norco for pain.  She states pain has gradually worsened, continues to be constant, waxing and waning in her epigastric abdomen radiating to her back.  She has associated acid reflux and belching.  No associated nausea, vomiting, diarrhea or fevers.  On initial evaluation, patient appears very uncomfortable with epigastric tenderness, no peritoneal signs.  Labs reveal a normal white blood cell count, metabolic panel is unremarkable,  lipase is improved from previous visit down to 63.  Normal LFTs.  Pain was treated with morphine with improvement, however symptoms returned and patient continues to be in pain.  Discussed with and evaluated by Dr. Lacinda Axon.  We will proceed with repeat CT imaging, will re-dose pain medication, administer IV PPI and plan for reevaluation for disposition.  Care assumed at shift change by PA Upstill.  If imaging is negative but pain continues to be intractable, consider admission.  Final Clinical Impressions(s) / ED Diagnoses   Final diagnoses:  None    ED Discharge Orders    None       Aslan Himes, [place] N, PA-C 09/02/19 0026    Nat Christen, MD 09/02/19 1601

## 2019-09-01 NOTE — ED Provider Notes (Signed)
epigastric AP, h/o GERD Minimally elevated lipase Today with worsening symptoms Labs improved, symptoms worse Pain that radiates to back No N, V, fever On carafate, vicodin, pepcid GI appt pending this week  Pending CT scan Admission if symptoms persist or if positive CT  Re-evaluation: patient is having increased pain. No nausea, or vomiting.   CT results show evidence acute pancreatitis. There is dilation of the CBD without visualized calcified stone in the duct or gall bladder. No LFT elevation.   Given evidence acute pancreatitis, CBD dilation, uncontrolled pain, advanced age, feel the patient would benefit from admission for pain control, and consideration of ERCP.  Discussed with Dr. Hal Hope who accepts the patient for admission.   Charlann Lange, PA-C 09/02/19 0258    Nat Christen, MD 09/02/19 1600

## 2019-09-01 NOTE — ED Notes (Signed)
Patient ambulated to restroom and back to stretcher.

## 2019-09-01 NOTE — ED Triage Notes (Signed)
Pt complaint of constant chest pain for 1.5 weeks. Denies n/v/d, GU symptoms, or SOB. Pt verbalizes "it feels like gas."

## 2019-09-01 NOTE — ED Notes (Signed)
Pt returned from xray. Pt verbalizing pain going into back and from chest down through abdomen. Pt appears fatigued. Knapp MD aware of pt change in complaint and aware pt en route to room 21.

## 2019-09-02 ENCOUNTER — Inpatient Hospital Stay (HOSPITAL_COMMUNITY): Payer: Medicare Other

## 2019-09-02 ENCOUNTER — Other Ambulatory Visit: Payer: Self-pay

## 2019-09-02 ENCOUNTER — Encounter (HOSPITAL_COMMUNITY): Payer: Self-pay | Admitting: Internal Medicine

## 2019-09-02 DIAGNOSIS — I10 Essential (primary) hypertension: Secondary | ICD-10-CM

## 2019-09-02 DIAGNOSIS — E785 Hyperlipidemia, unspecified: Secondary | ICD-10-CM | POA: Diagnosis present

## 2019-09-02 DIAGNOSIS — K859 Acute pancreatitis without necrosis or infection, unspecified: Secondary | ICD-10-CM | POA: Diagnosis present

## 2019-09-02 DIAGNOSIS — Z20828 Contact with and (suspected) exposure to other viral communicable diseases: Secondary | ICD-10-CM | POA: Diagnosis present

## 2019-09-02 DIAGNOSIS — Z79899 Other long term (current) drug therapy: Secondary | ICD-10-CM | POA: Diagnosis not present

## 2019-09-02 DIAGNOSIS — R1013 Epigastric pain: Secondary | ICD-10-CM | POA: Diagnosis present

## 2019-09-02 DIAGNOSIS — Z79891 Long term (current) use of opiate analgesic: Secondary | ICD-10-CM | POA: Diagnosis not present

## 2019-09-02 DIAGNOSIS — Z833 Family history of diabetes mellitus: Secondary | ICD-10-CM | POA: Diagnosis not present

## 2019-09-02 DIAGNOSIS — K861 Other chronic pancreatitis: Secondary | ICD-10-CM | POA: Diagnosis present

## 2019-09-02 DIAGNOSIS — Z7982 Long term (current) use of aspirin: Secondary | ICD-10-CM | POA: Diagnosis not present

## 2019-09-02 DIAGNOSIS — Z91048 Other nonmedicinal substance allergy status: Secondary | ICD-10-CM | POA: Diagnosis not present

## 2019-09-02 DIAGNOSIS — Z95828 Presence of other vascular implants and grafts: Secondary | ICD-10-CM | POA: Diagnosis not present

## 2019-09-02 DIAGNOSIS — Z8249 Family history of ischemic heart disease and other diseases of the circulatory system: Secondary | ICD-10-CM | POA: Diagnosis not present

## 2019-09-02 DIAGNOSIS — E1142 Type 2 diabetes mellitus with diabetic polyneuropathy: Secondary | ICD-10-CM | POA: Diagnosis present

## 2019-09-02 DIAGNOSIS — G4730 Sleep apnea, unspecified: Secondary | ICD-10-CM | POA: Diagnosis present

## 2019-09-02 DIAGNOSIS — E1151 Type 2 diabetes mellitus with diabetic peripheral angiopathy without gangrene: Secondary | ICD-10-CM | POA: Diagnosis present

## 2019-09-02 DIAGNOSIS — H409 Unspecified glaucoma: Secondary | ICD-10-CM | POA: Diagnosis present

## 2019-09-02 DIAGNOSIS — M797 Fibromyalgia: Secondary | ICD-10-CM | POA: Diagnosis present

## 2019-09-02 DIAGNOSIS — E1159 Type 2 diabetes mellitus with other circulatory complications: Secondary | ICD-10-CM | POA: Diagnosis present

## 2019-09-02 DIAGNOSIS — I739 Peripheral vascular disease, unspecified: Secondary | ICD-10-CM

## 2019-09-02 DIAGNOSIS — K219 Gastro-esophageal reflux disease without esophagitis: Secondary | ICD-10-CM | POA: Diagnosis present

## 2019-09-02 DIAGNOSIS — F418 Other specified anxiety disorders: Secondary | ICD-10-CM | POA: Diagnosis present

## 2019-09-02 DIAGNOSIS — Z87891 Personal history of nicotine dependence: Secondary | ICD-10-CM | POA: Diagnosis not present

## 2019-09-02 DIAGNOSIS — Z7984 Long term (current) use of oral hypoglycemic drugs: Secondary | ICD-10-CM | POA: Diagnosis not present

## 2019-09-02 LAB — CBC WITH DIFFERENTIAL/PLATELET
Abs Immature Granulocytes: 0.02 10*3/uL (ref 0.00–0.07)
Basophils Absolute: 0 10*3/uL (ref 0.0–0.1)
Basophils Relative: 1 %
Eosinophils Absolute: 0.2 10*3/uL (ref 0.0–0.5)
Eosinophils Relative: 3 %
HCT: 40.3 % (ref 36.0–46.0)
Hemoglobin: 13 g/dL (ref 12.0–15.0)
Immature Granulocytes: 0 %
Lymphocytes Relative: 40 %
Lymphs Abs: 2.5 10*3/uL (ref 0.7–4.0)
MCH: 30.7 pg (ref 26.0–34.0)
MCHC: 32.3 g/dL (ref 30.0–36.0)
MCV: 95.3 fL (ref 80.0–100.0)
Monocytes Absolute: 0.4 10*3/uL (ref 0.1–1.0)
Monocytes Relative: 6 %
Neutro Abs: 3.2 10*3/uL (ref 1.7–7.7)
Neutrophils Relative %: 50 %
Platelets: 320 10*3/uL (ref 150–400)
RBC: 4.23 MIL/uL (ref 3.87–5.11)
RDW: 12.7 % (ref 11.5–15.5)
WBC: 6.4 10*3/uL (ref 4.0–10.5)
nRBC: 0 % (ref 0.0–0.2)

## 2019-09-02 LAB — BASIC METABOLIC PANEL
Anion gap: 8 (ref 5–15)
BUN: 8 mg/dL (ref 8–23)
CO2: 23 mmol/L (ref 22–32)
Calcium: 7.8 mg/dL — ABNORMAL LOW (ref 8.9–10.3)
Chloride: 108 mmol/L (ref 98–111)
Creatinine, Ser: 0.6 mg/dL (ref 0.44–1.00)
GFR calc Af Amer: >60 mL/min (ref >60)
GFR calc non Af Amer: >60 mL/min (ref >60)
Glucose, Bld: 128 mg/dL — ABNORMAL HIGH (ref 70–99)
Potassium: 3.2 mmol/L — ABNORMAL LOW (ref 3.5–5.1)
Sodium: 139 mmol/L (ref 135–145)

## 2019-09-02 LAB — TROPONIN I (HIGH SENSITIVITY)
Troponin I (High Sensitivity): 4 ng/L (ref <18)
Troponin I (High Sensitivity): 4 ng/L (ref <18)

## 2019-09-02 LAB — HEPATIC FUNCTION PANEL
ALT: 15 U/L (ref 0–44)
AST: 15 U/L (ref 15–41)
Albumin: 3 g/dL — ABNORMAL LOW (ref 3.5–5.0)
Alkaline Phosphatase: 48 U/L (ref 38–126)
Bilirubin, Direct: 0.1 mg/dL (ref 0.0–0.2)
Indirect Bilirubin: 0.1 mg/dL — ABNORMAL LOW (ref 0.3–0.9)
Total Bilirubin: 0.2 mg/dL — ABNORMAL LOW (ref 0.3–1.2)
Total Protein: 5.9 g/dL — ABNORMAL LOW (ref 6.5–8.1)

## 2019-09-02 LAB — SARS CORONAVIRUS 2 (TAT 6-24 HRS): SARS Coronavirus 2: NEGATIVE

## 2019-09-02 LAB — TRIGLYCERIDES: Triglycerides: 102 mg/dL (ref <150)

## 2019-09-02 LAB — CBG MONITORING, ED
Glucose-Capillary: 161 mg/dL — ABNORMAL HIGH (ref 70–99)
Glucose-Capillary: 117 mg/dL — ABNORMAL HIGH (ref 70–99)
Glucose-Capillary: 130 mg/dL — ABNORMAL HIGH (ref 70–99)
Glucose-Capillary: 130 mg/dL — ABNORMAL HIGH (ref 70–99)

## 2019-09-02 LAB — GLUCOSE, CAPILLARY
Glucose-Capillary: 140 mg/dL — ABNORMAL HIGH (ref 70–99)
Glucose-Capillary: 195 mg/dL — ABNORMAL HIGH (ref 70–99)

## 2019-09-02 MED ORDER — ALBUTEROL SULFATE (2.5 MG/3ML) 0.083% IN NEBU: 2.5000 mg | INHALATION_SOLUTION | RESPIRATORY_TRACT | Status: DC | PRN

## 2019-09-02 MED ORDER — ONDANSETRON HCL 4 MG PO TABS: 4.0000 mg | ORAL_TABLET | Freq: Four times a day (QID) | ORAL | Status: DC | PRN

## 2019-09-02 MED ORDER — ONDANSETRON HCL 4 MG/2ML IJ SOLN: 4.0000 mg | Freq: Four times a day (QID) | INTRAMUSCULAR | Status: DC | PRN

## 2019-09-02 MED ORDER — HYDROMORPHONE HCL 1 MG/ML IJ SOLN
0.5000 mg | Freq: Once | INTRAMUSCULAR | Status: AC
  Administered 2019-09-02: 0.5 mg via INTRAVENOUS
  Filled 2019-09-02: qty 1

## 2019-09-02 MED ORDER — HYDRALAZINE HCL 20 MG/ML IJ SOLN: 10.0000 mg | INTRAMUSCULAR | Status: DC | PRN

## 2019-09-02 MED ORDER — LACTATED RINGERS IV SOLN
INTRAVENOUS | Status: AC
  Administered 2019-09-02 (×2): via INTRAVENOUS

## 2019-09-02 MED ORDER — METOPROLOL SUCCINATE ER 50 MG PO TB24
100.0000 mg | ORAL_TABLET | Freq: Every day | ORAL | Status: DC
  Administered 2019-09-02 – 2019-09-03 (×2): 100 mg via ORAL
  Filled 2019-09-02 (×2): qty 2
  Filled 2019-09-02: qty 1

## 2019-09-02 MED ORDER — GABAPENTIN 300 MG PO CAPS
300.0000 mg | ORAL_CAPSULE | Freq: Three times a day (TID) | ORAL | Status: DC
  Administered 2019-09-02 – 2019-09-04 (×7): 300 mg via ORAL
  Filled 2019-09-02 (×7): qty 1

## 2019-09-02 MED ORDER — PANTOPRAZOLE SODIUM 40 MG PO TBEC
40.0000 mg | DELAYED_RELEASE_TABLET | Freq: Every day | ORAL | Status: DC
  Administered 2019-09-02 – 2019-09-04 (×3): 40 mg via ORAL
  Filled 2019-09-02 (×3): qty 1

## 2019-09-02 MED ORDER — GADOBUTROL 1 MMOL/ML IV SOLN
7.0000 mL | Freq: Once | INTRAVENOUS | Status: AC | PRN
  Administered 2019-09-02: 7 mL via INTRAVENOUS

## 2019-09-02 MED ORDER — OXYCODONE HCL 5 MG PO TABS
5.0000 mg | ORAL_TABLET | ORAL | Status: DC | PRN
  Administered 2019-09-02 – 2019-09-04 (×8): 5 mg via ORAL
  Filled 2019-09-02 (×8): qty 1

## 2019-09-02 MED ORDER — AMLODIPINE BESYLATE 5 MG PO TABS
5.0000 mg | ORAL_TABLET | Freq: Every day | ORAL | Status: DC
  Administered 2019-09-02 – 2019-09-04 (×3): 5 mg via ORAL
  Filled 2019-09-02 (×3): qty 1

## 2019-09-02 MED ORDER — POLYETHYLENE GLYCOL 3350 17 G PO PACK: 17.0000 g | PACK | Freq: Every day | ORAL | Status: DC | PRN

## 2019-09-02 MED ORDER — SENNOSIDES-DOCUSATE SODIUM 8.6-50 MG PO TABS: 2.0000 | ORAL_TABLET | Freq: Every evening | ORAL | Status: DC | PRN

## 2019-09-02 MED ORDER — ALPRAZOLAM 0.5 MG PO TABS
0.5000 mg | ORAL_TABLET | Freq: Three times a day (TID) | ORAL | Status: DC | PRN
  Administered 2019-09-02: 0.5 mg via ORAL
  Filled 2019-09-02: qty 1

## 2019-09-02 MED ORDER — ACETAMINOPHEN 650 MG RE SUPP: 650.0000 mg | Freq: Four times a day (QID) | RECTAL | Status: DC | PRN

## 2019-09-02 MED ORDER — FAMOTIDINE 20 MG PO TABS
20.0000 mg | ORAL_TABLET | Freq: Two times a day (BID) | ORAL | Status: DC
  Administered 2019-09-02 – 2019-09-04 (×5): 20 mg via ORAL
  Filled 2019-09-02 (×5): qty 1

## 2019-09-02 MED ORDER — INSULIN ASPART 100 UNIT/ML ~~LOC~~ SOLN
0.0000 [IU] | SUBCUTANEOUS | Status: DC
  Administered 2019-09-02: 2 [IU] via SUBCUTANEOUS
  Administered 2019-09-02: 1 [IU] via SUBCUTANEOUS
  Administered 2019-09-02: 09:00:00 2 [IU] via SUBCUTANEOUS
  Administered 2019-09-03: 5 [IU] via SUBCUTANEOUS
  Administered 2019-09-03: 1 [IU] via SUBCUTANEOUS
  Administered 2019-09-03: 5 [IU] via SUBCUTANEOUS
  Administered 2019-09-03: 2 [IU] via SUBCUTANEOUS
  Administered 2019-09-03 (×2): 1 [IU] via SUBCUTANEOUS
  Administered 2019-09-04 (×2): 3 [IU] via SUBCUTANEOUS
  Administered 2019-09-04: 5 [IU] via SUBCUTANEOUS
  Administered 2019-09-04: 2 [IU] via SUBCUTANEOUS
  Filled 2019-09-02: qty 0.09

## 2019-09-02 MED ORDER — ACETAMINOPHEN 325 MG PO TABS
650.0000 mg | ORAL_TABLET | Freq: Four times a day (QID) | ORAL | Status: DC | PRN
  Administered 2019-09-03: 650 mg via ORAL
  Filled 2019-09-02: qty 2

## 2019-09-02 MED ORDER — SODIUM CHLORIDE 0.9 % IV BOLUS
1000.0000 mL | Freq: Once | INTRAVENOUS | Status: AC
  Administered 2019-09-02: 03:00:00 1000 mL via INTRAVENOUS

## 2019-09-02 MED ORDER — MORPHINE SULFATE (PF) 2 MG/ML IV SOLN
1.0000 mg | INTRAVENOUS | Status: DC | PRN
  Administered 2019-09-02 – 2019-09-03 (×3): 1 mg via INTRAVENOUS
  Filled 2019-09-02 (×3): qty 1

## 2019-09-02 MED ORDER — ALBUTEROL SULFATE HFA 108 (90 BASE) MCG/ACT IN AERS: 2.0000 | INHALATION_SPRAY | RESPIRATORY_TRACT | Status: DC | PRN

## 2019-09-02 MED ORDER — SUCRALFATE 1 GM/10ML PO SUSP
1.0000 g | Freq: Three times a day (TID) | ORAL | Status: DC
  Administered 2019-09-02 – 2019-09-04 (×9): 1 g via ORAL
  Filled 2019-09-02 (×9): qty 10

## 2019-09-02 NOTE — H&P (View-Only) (Signed)
History and Physical    Jill Higgins [id number] DOB: [birth year] DOA: 09/01/2019  PCP: Carol Ada, MD  Patient coming from: Home.  Chief Complaint: Epigastric pain.  HPI: Jill Higgins is a 73 y.o. female with history of hypertension, diabetes mellitus type 2, peripheral vascular disease status post bypass being followed by vascular surgeon, hyperlipidemia presents to the ER because of ongoing abdominal pain.  Pain is mostly in the epigastric area radiating to the back.  Pain has been going on for almost 10 days had come to the ER about 10 days ago at that time lipase was mildly elevated.  LFTs were normal.  Patient states patient was started on lisinopril about a month ago for blood pressure.  Denies drinking alcohol.  Over the last 2 days pain is acutely worsened and decided to come back to the ER.  Has not been able to eat well last 2 days because of the pain.  Denies nausea vomiting or diarrhea.  Denies any fever chills shortness of breath pain is mostly epigastric sometimes radiating to the chest.  ED Course: In the ER EKG shows normal sinus rhythm.  High-sensitivity troponin was 3.  CT abdomen pelvis shows features concerning for acute pancreatitis ductal dilation extrahepatic biliary ductal dilatation.  Chest x-ray was unremarkable.  Creatinine 0.6 hematocrit 42.4 BUN 10.  Patient started on IV fluids and admitted for acute pancreatitis.  Review of Systems: As per HPI, rest all negative.   Past Medical History:  Diagnosis Date   Bronchitis    Bulging disc    in neck   Depression with anxiety    Diabetes mellitus    Type II   Fibromyalgia    pt. denies   GERD (gastroesophageal reflux disease)    Glaucoma    History of bronchitis    Hyperlipidemia    Hypertension    Overactive bladder    Peripheral vascular disease (HCC)    Pneumonia    Sleep apnea    recent test negative for sleep apnea   Tobacco abuse    Urinary frequency     Past Surgical  History:  Procedure Laterality Date   ABDOMINAL AORTAGRAM N/A 08/15/2013   Procedure: ABDOMINAL Maxcine Ham;  Surgeon: Elam Dutch, MD;  Location: [facility] CATH LAB;  Service: Cardiovascular;  Laterality: N/A;   ABDOMINAL AORTAGRAM N/A 06/19/2014   Procedure: ABDOMINAL Maxcine Ham;  Surgeon: Elam Dutch, MD;  Location: [facility] CATH LAB;  Service: Cardiovascular;  Laterality: N/A;   ABDOMINAL AORTOGRAM W/LOWER EXTREMITY N/A 05/10/2018   Procedure: ABDOMINAL AORTOGRAM W/LOWER EXTREMITY;  Surgeon: Elam Dutch, MD;  Location: [place] CV LAB;  Service: Cardiovascular;  Laterality: N/A;  bilateral   Aortogram w/ PTA  05/14/08,  11-04-10   Bilateral aortogram w/ bilateral  SFA PTA  stenting    BREAST SURGERY Right    boil removal   BUNIONECTOMY     L foot in the 1980s   COLONOSCOPY     2014   DILATION AND CURETTAGE OF UTERUS     X4   ENDARTERECTOMY FEMORAL Right 06/03/2018   Procedure: RIGHT FEMORAL ENDARTERECTOMY;  Surgeon: Elam Dutch, MD;  Location: [place];  Service: Vascular;  Laterality: Right;   Epidural shots in neck      EYE SURGERY Bilateral 2016   cataract removal   FEMORAL-POPLITEAL BYPASS GRAFT Left 10/21/2013   Procedure: LEFT FEMORAL-POPLITEAL ARTERY BYPASS WITH SAPHENOUS VEIN GRAFT , POPLITEAL ENDARTERECTOMY ,INTRAOPERATIVE ARTERIOGRAM, vein patch angioplasty to popliteal artery;  Surgeon: Elam Dutch, MD;  Location: [facility] OR;  Service: Vascular;  Laterality: Left;   FEMORAL-POPLITEAL BYPASS GRAFT Right 02/08/2016   Procedure: Right FEMORAL- to Above Knee POPLITEAL ARTERY Bypass Graft with reversed saphenous vein and Common Femoral Endarterectomy  with profundoplasty;  Surgeon: Elam Dutch, MD;  Location: [place];  Service: Vascular;  Laterality: Right;   GROIN DEBRIDEMENT Left 10/28/2013   Procedure: left inner thigh DEBRIDEMENT;  Surgeon: Elam Dutch, MD;  Location: [place];  Service: Vascular;  Laterality: Left;   HAND SURGERY Left 2010   LOWER  EXTREMITY ANGIOGRAM Bilateral 12/24/2015   Procedure: Lower Extremity Angiogram;  Surgeon: Elam Dutch, MD;  Location: [place] CV LAB;  Service: Cardiovascular;  Laterality: Bilateral;   PATCH ANGIOPLASTY Right 06/03/2018   Procedure: PATCH ANGIOPLASTY USING A XENOSURE 1CM X 14CM BIOLOGIC PATCH;  Surgeon: Elam Dutch, MD;  Location: [facility] OR;  Service: Vascular;  Laterality: Right;   PERIPHERAL VASCULAR CATHETERIZATION N/A 12/24/2015   Procedure: Abdominal Aortogram;  Surgeon: Elam Dutch, MD;  Location: [place] CV LAB;  Service: Cardiovascular;  Laterality: N/A;   PERIPHERAL VASCULAR CATHETERIZATION Left 12/24/2015   Procedure: Peripheral Vascular Balloon Angioplasty;  Surgeon: Elam Dutch, MD;  Location: [place] CV LAB;  Service: Cardiovascular;  Laterality: Left;  drug coated balloon   PERIPHERAL VASCULAR CATHETERIZATION N/A 06/30/2016   Procedure: Abdominal Aortogram;  Surgeon: Elam Dutch, MD;  Location: [place] CV LAB;  Service: Cardiovascular;  Laterality: N/A;   PERIPHERAL VASCULAR CATHETERIZATION Bilateral 06/30/2016   Procedure: Lower Extremity Angiography;  Surgeon: Elam Dutch, MD;  Location: [place] CV LAB;  Service: Cardiovascular;  Laterality: Bilateral;   TONSILLECTOMY       reports that she quit smoking about 4 years ago. Her smoking use included cigarettes. She smoked 0.10 packs per day. She has never used smokeless tobacco. She reports current alcohol use. She reports that she does not use drugs.  Allergies  Allergen Reactions   Adhesive [Tape] Other (See Comments)    Pt states that tape and electrodes leave red "scars" on her skin and her skin is very sensitive.    Family History  Problem Relation Age of Onset   Heart disease Father    Heart attack Father        MI at age 69   Hypertension Mother    Alzheimer's disease Mother    Hypertension Sister    Diabetes Brother    Hyperlipidemia Brother    Hypertension  Brother    Heart disease Brother    Heart attack Brother    Breast cancer Maternal Aunt     Prior to Admission medications   Medication Sig Start Date End Date Taking? Authorizing Provider  amLODipine (NORVASC) 5 MG tablet Take 5 mg by mouth daily.  10/24/16  Yes [provider]  aspirin EC 81 MG tablet Take 81 mg by mouth at bedtime.    Yes [provider]  bismuth subsalicylate (PEPTO BISMOL) 262 MG/15ML suspension Take 30 mLs by mouth every 6 (six) hours as needed for indigestion or diarrhea or loose stools. Reported on 11/04/2015   Yes [provider]  famotidine (PEPCID) 20 MG tablet Take 1 tablet (20 mg total) by mouth 2 (two) times daily. 08/22/19  Yes Carlisle Cater, PA-C  gabapentin (NEURONTIN) 300 MG capsule TAKE 1 CAPSULE BY MOUTH THREE TIMES A DAY Patient taking differently: Take 300 mg by mouth 3 (three) times daily.  08/18/19  Yes Elam Dutch, MD  HYDROcodone-acetaminophen (NORCO/VICODIN) 5-325 MG tablet Take 0.5-1 tablets every 6 hours as needed for severe pain 08/22/19  Yes Carlisle Cater, PA-C  lisinopril (ZESTRIL) 5 MG tablet Take 5 mg by mouth daily. 08/11/19  Yes [provider]  lovastatin (MEVACOR) 20 MG tablet Take 20 mg by mouth every evening.    Yes [provider]  metFORMIN (GLUCOPHAGE) 1000 MG tablet Take 1 tablet (1,000 mg total) by mouth 2 (two) times daily with a meal. 08/04/19  Yes Renato Shin, MD  methocarbamol (ROBAXIN) 500 MG tablet Take 1 tablet (500 mg total) by mouth 2 (two) times daily. 03/09/18  Yes Ocie Cornfield T, PA-C  metoprolol succinate (TOPROL-XL) 100 MG 24 hr tablet Take 100 mg by mouth daily.  01/10/17  Yes [provider]  Netarsudil-Latanoprost (ROCKLATAN) 0.02-0.005 % SOLN Apply to eye.   Yes [provider]  OVER THE COUNTER MEDICATION Apply 1 patch topically daily as needed (pain). Apply to right shoulder   Yes [provider]  pantoprazole (PROTONIX) 40 MG tablet  Take 40 mg by mouth daily. 10/25/15  Yes [provider]  PROAIR HFA 108 (90 BASE) MCG/ACT inhaler Inhale 2 puffs into the lungs every 4 (four) hours as needed for wheezing or shortness of breath. Reported on 11/04/2015 09/02/13  Yes [provider]  repaglinide (PRANDIN) 1 MG tablet Take 1 tablet (1 mg total) by mouth 2 (two) times daily before a meal. 06/03/19  Yes Renato Shin, MD  sucralfate (CARAFATE) 1 GM/10ML suspension Take 10 mLs (1 g total) by mouth 4 (four) times daily -  with meals and at bedtime. 08/22/19  Yes Carlisle Cater, PA-C  temazepam (RESTORIL) 30 MG capsule Take 30 mg by mouth at bedtime as needed for sleep. 04/15/18  Yes [provider]  Accu-Chek Softclix Lancets lancets 1 each by Other route 3 (three) times daily. Use to monitor glucose levels TID; E11.9    [provider]  glucose blood (ACCU-CHEK AVIVA PLUS) test strip 1 each by Other route 3 (three) times daily. 06/03/19   Renato Shin, MD    Physical Exam: Constitutional: Moderately built and nourished. Vitals:   09/02/19 0200 09/02/19 0300 09/02/19 0330 09/02/19 0400  BP: 136/80 129/82 (!) 141/70 132/69  Pulse: (!) 57 60 (!) 58 (!) 57  Resp: (!) 23 20 14 12   Temp:      TempSrc:      SpO2: 94% 99% 95% 96%   Eyes: Anicteric no pallor. ENMT: No discharge from the ears eyes nose or mouth. Neck: No mass felt.  No neck rigidity.  No JVD appreciated. Respiratory: No rhonchi or crepitations. Cardiovascular: S1-S2 heard. Abdomen: Soft nontender bowel sounds present. Musculoskeletal: No edema. Skin: No rash. Neurologic: Alert awake oriented to time place and person.  Moves all extremities. Psychiatric: Appears normal.   Labs on Admission: I have personally reviewed following labs and imaging studies  CBC: Recent Labs  Lab 09/01/19 1803  WBC 7.9  HGB 13.9  HCT 42.4  MCV 93.4  PLT [id number]   Basic Metabolic Panel: Recent Labs  Lab 09/01/19 1803  NA 136  K 3.8  CL 100    CO2 23  GLUCOSE 160*  BUN 10  CREATININE 0.65  CALCIUM 9.8   GFR: CrCl cannot be calculated (Unknown ideal weight.). Liver Function Tests: Recent Labs  Lab 09/01/19 1803  AST 17  ALT 18  ALKPHOS 63  BILITOT 0.4  PROT 7.8  ALBUMIN 4.0  Recent Labs  Lab 09/01/19 1803  LIPASE 63*   No results for input(s): AMMONIA in the last 168 hours. Coagulation Profile: No results for input(s): INR, PROTIME in the last 168 hours. Cardiac Enzymes: No results for input(s): CKTOTAL, CKMB, CKMBINDEX, TROPONINI in the last 168 hours. BNP (last 3 results) No results for input(s): PROBNP in the last 8760 hours. HbA1C: No results for input(s): HGBA1C in the last 72 hours. CBG: No results for input(s): GLUCAP in the last 168 hours. Lipid Profile: No results for input(s): CHOL, HDL, LDLCALC, TRIG, CHOLHDL, LDLDIRECT in the last 72 hours. Thyroid Function Tests: No results for input(s): TSH, T4TOTAL, FREET4, T3FREE, THYROIDAB in the last 72 hours. Anemia Panel: No results for input(s): VITAMINB12, FOLATE, FERRITIN, TIBC, IRON, RETICCTPCT in the last 72 hours. Urine analysis:    Component Value Date/Time   COLORURINE AMBER (A) 08/22/2019 2006   APPEARANCEUR CLOUDY (A) 08/22/2019 2006   LABSPEC 1.029 08/22/2019 2006   PHURINE 5.0 08/22/2019 2006   GLUCOSEU NEGATIVE 08/22/2019 2006   HGBUR NEGATIVE 08/22/2019 2006   BILIRUBINUR SMALL (A) 08/22/2019 2006   [place] NEGATIVE 08/22/2019 2006   PROTEINUR 30 (A) 08/22/2019 2006   UROBILINOGEN 0.2 09/29/2013 1359   NITRITE NEGATIVE 08/22/2019 2006   LEUKOCYTESUR NEGATIVE 08/22/2019 2006   Sepsis Labs: @LABRCNTIP (procalcitonin:4,lacticidven:4) )No results found for this or any previous visit (from the past 240 hour(s)).   Radiological Exams on Admission: Dg Chest 2 View  Result Date: 09/01/2019 CLINICAL DATA:  Chest pain. EXAM: CHEST - 2 VIEW COMPARISON:  06/05/2018 FINDINGS: The heart size and mediastinal contours are within normal  limits. Both lungs are clear. The visualized skeletal structures are unremarkable. IMPRESSION: No active cardiopulmonary disease. Electronically Signed   By: Kerby Moors M.D.   On: 09/01/2019 18:16   Ct Abdomen Pelvis W Contrast  Result Date: 09/02/2019 CLINICAL DATA:  Chest pain abdominal pain EXAM: CT ABDOMEN AND PELVIS WITH CONTRAST TECHNIQUE: Multidetector CT imaging of the abdomen and pelvis was performed using the standard protocol following bolus administration of intravenous contrast. CONTRAST:  151mL OMNIPAQUE IOHEXOL 300 MG/ML  SOLN COMPARISON:  CT 08/22/2019 FINDINGS: Lower chest: Lung bases demonstrate no acute consolidation or effusion. The heart size is normal. Coronary vascular calcification. Hepatobiliary: No calcified gallstone. No focal hepatic abnormality. Enlarged extrahepatic common bile duct up to 10 mm with tapering at the head of the pancreas. Pancreas: Mild ductal dilatation. Indistinct appearance of the pancreatic head and uncinate process with mild soft tissue stranding suspicious for an acute pancreatitis. Spleen: Normal in size without focal abnormality. Adrenals/Urinary Tract: Adrenal glands are normal. Kidneys show no hydronephrosis. The bladder is unremarkable Stomach/Bowel: Stomach is nonenlarged. No dilated small bowel. No bowel wall thickening. Appendix contains small stone but is otherwise normal. Vascular/Lymphatic: Moderate aortic atherosclerosis without aneurysm. No significant adenopathy soft tissue thickening and presumed postsurgical changes at the common femoral vessels bilaterally. Reproductive: Uterus and bilateral adnexa are unremarkable. Other: Negative for free air or free fluid Musculoskeletal: No acute or suspicious osseous abnormality IMPRESSION: 1. Indistinct appearance of the pancreatic head and uncinate process consistent with acute pancreatitis. New mild pancreatic ductal dilatation. No necrosis or organized fluid collection. 2. Mild extrahepatic biliary  dilatation, suggest correlation with LFTs. Follow-up MRCP as indicated. Electronically Signed   By: Donavan Foil M.D.   On: 09/02/2019 01:35    EKG: Independently reviewed.  Normal sinus rhythm.  Assessment/Plan Principal Problem:   Acute pancreatitis Active Problems:   Hypertension   PAD (peripheral artery  disease) ([place])   S/P femoropopliteal bypass surgery   Type 2 diabetes mellitus with vascular disease ([place])    1. Acute pancreatitis -cause not clear.  Patient states he does not take alcohol.  No definite evidence of choledocholithiasis however he does show new mild pancreatic ductal dilatation and also extrahepatic biliary dilatation.  Will check MRCP.  Check triglyceride levels.  Keep patient n.p.o. and IV fluids and pain relief medications.  Patient was recently started on lisinopril not to the hospital. 2. Diabetes mellitus type 2 we will keep patient on sliding scale coverage. 3. Hypertension we will continue metoprolol and PRN IV hydralazine.  Hold lisinopril for now. 4. History of peripheral vascular disease no acute issues at this time.  COVID-19 test is pending.   DVT prophylaxis: SCDs until reviewed MRCP results. Code Status: Full code. Family Communication: Discussed with patient. Disposition Plan: Home. Consults called: None. Admission status: Observation.   Rise Patience MD [facility] Pager [phone].  If 7PM-7AM, please contact night-coverage [email or web address] Password [email or web address]  09/02/2019, 4:17 AM

## 2019-09-02 NOTE — Progress Notes (Signed)
PROGRESS NOTE    Jill Higgins  [id number] DOB: 17-Nov-1946 DOA: 09/01/2019 PCP: Carol Ada, MD   Brief Narrative:  73 year old with history of diabetes mellitus type 2, peripheral vascular disease status post bypass, essential hypertension presented with complaints of epigastric abdominal pain radiating to her back which has been ongoing for several days.  CT of the abdomen pelvis showed acute pancreatitis with ductal dilation extrahepatic ductal dilation.   Assessment & Plan:   Principal Problem:   Acute pancreatitis Active Problems:   Hypertension   PAD (peripheral artery disease) (HCC)   S/P femoropopliteal bypass surgery   Type 2 diabetes mellitus with vascular disease (HCC)   Acute pancreatitis with ductal dilation -No obvious evidence of choledocholithiasis. - Triglycerides-102 -MRCP ordered - Clear liquid diet, pain control.  IV fluids.  Diabetes mellitus type 2 Peripheral neuropathy secondary to diabetes mellitus type 2 -Accu-Cheks and sliding scale -Gabapentin  Essential hypertension -Holding lisinopril.  Continue Norvasc metoprolol, PRN IV hydralazine  History of peripheral vascular disease status post bypass   DVT prophylaxis: SCDs Code Status: Full code Family Communication: None at bedside Disposition Plan: Maintain hospital stay as patient is still not able to tolerate any oral diet and having abdominal discomfort  Consultants:   None  Procedures:   None  Antimicrobials:   None   Subjective: Still reporting abdominal discomfort especially pain in the epigastric region.  Pain medications intermittently helping her.  Review of Systems Otherwise negative except as per HPI, including: General: Denies fever, chills, night sweats or unintended weight loss. Resp: Denies cough, wheezing, shortness of breath. Cardiac: Denies chest pain, palpitations, orthopnea, paroxysmal nocturnal dyspnea. GI: Denies  diarrhea or constipation GU: Denies  dysuria, frequency, hesitancy or incontinence MS: Denies muscle aches, joint pain or swelling Neuro: Denies headache, neurologic deficits (focal weakness, numbness, tingling), abnormal gait Psych: Denies anxiety, depression, SI/HI/AVH Skin: Denies new rashes or lesions ID: Denies sick contacts, exotic exposures, travel  Objective: Vitals:   09/02/19 0630 09/02/19 0700 09/02/19 0730 09/02/19 0800  BP: 140/84 124/65 (!) 142/65 (!) 133/93  Pulse: (!) 56 (!) 54 (!) 53 (!) 52  Resp: 12 12 10 12   Temp:      TempSrc:      SpO2: 94% 94% 95% 95%    Intake/Output Summary (Last 24 hours) at 09/02/2019 0837 Last data filed at 09/02/2019 [id number] Gross per 24 hour  Intake 1274.5 ml  Output --  Net 1274.5 ml   There were no vitals filed for this visit.  Examination:  General exam: Appears calm and comfortable  Respiratory system: Clear to auscultation. Respiratory effort normal. Cardiovascular system: S1 & S2 heard, RRR. No JVD, murmurs, rubs, gallops or clicks. No pedal edema. Gastrointestinal system: Abdomen is nondistended, soft and nontender. No organomegaly or masses felt. Normal bowel sounds heard. Central nervous system: Alert and oriented. No focal neurological deficits. Extremities: Symmetric 5 x 5 power. Skin: No rashes, lesions or ulcers Psychiatry: Judgement and insight appear normal. Mood & affect appropriate.     Data Reviewed:   CBC: Recent Labs  Lab 09/01/19 1803 09/02/19 0448  WBC 7.9 6.4  NEUTROABS  --  3.2  HGB 13.9 13.0  HCT 42.4 40.3  MCV 93.4 95.3  PLT 379 [id number]   Basic Metabolic Panel: Recent Labs  Lab 09/01/19 1803 09/02/19 0448  NA 136 139  K 3.8 3.2*  CL 100 108  CO2 23 23  GLUCOSE 160* 128*  BUN 10 8  CREATININE 0.65 0.60  CALCIUM  9.8 7.8*   GFR: CrCl cannot be calculated (Unknown ideal weight.). Liver Function Tests: Recent Labs  Lab 09/01/19 1803 09/02/19 0448  AST 17 15  ALT 18 15  ALKPHOS 63 48  BILITOT 0.4 0.2*  PROT 7.8 5.9*   ALBUMIN 4.0 3.0*   Recent Labs  Lab 09/01/19 1803  LIPASE 63*   No results for input(s): AMMONIA in the last 168 hours. Coagulation Profile: No results for input(s): INR, PROTIME in the last 168 hours. Cardiac Enzymes: No results for input(s): CKTOTAL, CKMB, CKMBINDEX, TROPONINI in the last 168 hours. BNP (last 3 results) No results for input(s): PROBNP in the last 8760 hours. HbA1C: No results for input(s): HGBA1C in the last 72 hours. CBG: Recent Labs  Lab 09/02/19 0812  GLUCAP 161*   Lipid Profile: Recent Labs    09/02/19 0448  TRIG 102   Thyroid Function Tests: No results for input(s): TSH, T4TOTAL, FREET4, T3FREE, THYROIDAB in the last 72 hours. Anemia Panel: No results for input(s): VITAMINB12, FOLATE, FERRITIN, TIBC, IRON, RETICCTPCT in the last 72 hours. Sepsis Labs: No results for input(s): PROCALCITON, LATICACIDVEN in the last 168 hours.  No results found for this or any previous visit (from the past 240 hour(s)).       Radiology Studies: Dg Chest 2 View  Result Date: 09/01/2019 CLINICAL DATA:  Chest pain. EXAM: CHEST - 2 VIEW COMPARISON:  06/05/2018 FINDINGS: The heart size and mediastinal contours are within normal limits. Both lungs are clear. The visualized skeletal structures are unremarkable. IMPRESSION: No active cardiopulmonary disease. Electronically Signed   By: Kerby Moors M.D.   On: 09/01/2019 18:16   Ct Abdomen Pelvis W Contrast  Result Date: 09/02/2019 CLINICAL DATA:  Chest pain abdominal pain EXAM: CT ABDOMEN AND PELVIS WITH CONTRAST TECHNIQUE: Multidetector CT imaging of the abdomen and pelvis was performed using the standard protocol following bolus administration of intravenous contrast. CONTRAST:  127mL OMNIPAQUE IOHEXOL 300 MG/ML  SOLN COMPARISON:  CT 08/22/2019 FINDINGS: Lower chest: Lung bases demonstrate no acute consolidation or effusion. The heart size is normal. Coronary vascular calcification. Hepatobiliary: No calcified  gallstone. No focal hepatic abnormality. Enlarged extrahepatic common bile duct up to 10 mm with tapering at the head of the pancreas. Pancreas: Mild ductal dilatation. Indistinct appearance of the pancreatic head and uncinate process with mild soft tissue stranding suspicious for an acute pancreatitis. Spleen: Normal in size without focal abnormality. Adrenals/Urinary Tract: Adrenal glands are normal. Kidneys show no hydronephrosis. The bladder is unremarkable Stomach/Bowel: Stomach is nonenlarged. No dilated small bowel. No bowel wall thickening. Appendix contains small stone but is otherwise normal. Vascular/Lymphatic: Moderate aortic atherosclerosis without aneurysm. No significant adenopathy soft tissue thickening and presumed postsurgical changes at the common femoral vessels bilaterally. Reproductive: Uterus and bilateral adnexa are unremarkable. Other: Negative for free air or free fluid Musculoskeletal: No acute or suspicious osseous abnormality IMPRESSION: 1. Indistinct appearance of the pancreatic head and uncinate process consistent with acute pancreatitis. New mild pancreatic ductal dilatation. No necrosis or organized fluid collection. 2. Mild extrahepatic biliary dilatation, suggest correlation with LFTs. Follow-up MRCP as indicated. Electronically Signed   By: Donavan Foil M.D.   On: 09/02/2019 01:35        Scheduled Meds:  insulin aspart  0-9 Units Subcutaneous Q4H   metoprolol succinate  100 mg Oral Daily   sodium chloride (PF)       sodium chloride flush  3 mL Intravenous Once   Continuous Infusions:  lactated ringers 125 mL/hr  at 09/02/19 0702     LOS: 0 days   Time spent= 45 mins    Jancie Kercher Arsenio Loader, MD [facility]  If 7PM-7AM, please contact night-coverage [email or web address] 09/02/2019, 8:37 AM

## 2019-09-02 NOTE — H&P (Signed)
History and Physical    Jill Higgins [id number] DOB: 06/29/1946 DOA: 09/01/2019  PCP: Carol Ada, MD  Patient coming from: Home.  Chief Complaint: Epigastric pain.  HPI: Jill Higgins is a 73 y.o. female with history of hypertension, diabetes mellitus type 2, peripheral vascular disease status post bypass being followed by vascular surgeon, hyperlipidemia presents to the ER because of ongoing abdominal pain.  Pain is mostly in the epigastric area radiating to the back.  Pain has been going on for almost 10 days had come to the ER about 10 days ago at that time lipase was mildly elevated.  LFTs were normal.  Patient states patient was started on lisinopril about a month ago for blood pressure.  Denies drinking alcohol.  Over the last 2 days pain is acutely worsened and decided to come back to the ER.  Has not been able to eat well last 2 days because of the pain.  Denies nausea vomiting or diarrhea.  Denies any fever chills shortness of breath pain is mostly epigastric sometimes radiating to the chest.  ED Course: In the ER EKG shows normal sinus rhythm.  High-sensitivity troponin was 3.  CT abdomen pelvis shows features concerning for acute pancreatitis ductal dilation extrahepatic biliary ductal dilatation.  Chest x-ray was unremarkable.  Creatinine 0.6 hematocrit 42.4 BUN 10.  Patient started on IV fluids and admitted for acute pancreatitis.  Review of Systems: As per HPI, rest all negative.   Past Medical History:  Diagnosis Date   Bronchitis    Bulging disc    in neck   Depression with anxiety    Diabetes mellitus    Type II   Fibromyalgia    pt. denies   GERD (gastroesophageal reflux disease)    Glaucoma    History of bronchitis    Hyperlipidemia    Hypertension    Overactive bladder    Peripheral vascular disease (HCC)    Pneumonia    Sleep apnea    recent test negative for sleep apnea   Tobacco abuse    Urinary frequency     Past Surgical  History:  Procedure Laterality Date   ABDOMINAL AORTAGRAM N/A 08/15/2013   Procedure: ABDOMINAL Maxcine Ham;  Surgeon: Elam Dutch, MD;  Location: [facility] CATH LAB;  Service: Cardiovascular;  Laterality: N/A;   ABDOMINAL AORTAGRAM N/A 06/19/2014   Procedure: ABDOMINAL Maxcine Ham;  Surgeon: Elam Dutch, MD;  Location: [facility] CATH LAB;  Service: Cardiovascular;  Laterality: N/A;   ABDOMINAL AORTOGRAM W/LOWER EXTREMITY N/A 05/10/2018   Procedure: ABDOMINAL AORTOGRAM W/LOWER EXTREMITY;  Surgeon: Elam Dutch, MD;  Location: [place] CV LAB;  Service: Cardiovascular;  Laterality: N/A;  bilateral   Aortogram w/ PTA  05/14/08,  11-04-10   Bilateral aortogram w/ bilateral  SFA PTA  stenting    BREAST SURGERY Right    boil removal   BUNIONECTOMY     L foot in the 1980s   COLONOSCOPY     2014   DILATION AND CURETTAGE OF UTERUS     X4   ENDARTERECTOMY FEMORAL Right 06/03/2018   Procedure: RIGHT FEMORAL ENDARTERECTOMY;  Surgeon: Elam Dutch, MD;  Location: [place];  Service: Vascular;  Laterality: Right;   Epidural shots in neck      EYE SURGERY Bilateral 2016   cataract removal   FEMORAL-POPLITEAL BYPASS GRAFT Left 10/21/2013   Procedure: LEFT FEMORAL-POPLITEAL ARTERY BYPASS WITH SAPHENOUS VEIN GRAFT , POPLITEAL ENDARTERECTOMY ,INTRAOPERATIVE ARTERIOGRAM, vein patch angioplasty to popliteal artery;  Surgeon: Elam Dutch, MD;  Location: [facility] OR;  Service: Vascular;  Laterality: Left;   FEMORAL-POPLITEAL BYPASS GRAFT Right 02/08/2016   Procedure: Right FEMORAL- to Above Knee POPLITEAL ARTERY Bypass Graft with reversed saphenous vein and Common Femoral Endarterectomy  with profundoplasty;  Surgeon: Elam Dutch, MD;  Location: [place];  Service: Vascular;  Laterality: Right;   GROIN DEBRIDEMENT Left 10/28/2013   Procedure: left inner thigh DEBRIDEMENT;  Surgeon: Elam Dutch, MD;  Location: [place];  Service: Vascular;  Laterality: Left;   HAND SURGERY Left 2010   LOWER  EXTREMITY ANGIOGRAM Bilateral 12/24/2015   Procedure: Lower Extremity Angiogram;  Surgeon: Elam Dutch, MD;  Location: [place] CV LAB;  Service: Cardiovascular;  Laterality: Bilateral;   PATCH ANGIOPLASTY Right 06/03/2018   Procedure: PATCH ANGIOPLASTY USING A XENOSURE 1CM X 14CM BIOLOGIC PATCH;  Surgeon: Elam Dutch, MD;  Location: [facility] OR;  Service: Vascular;  Laterality: Right;   PERIPHERAL VASCULAR CATHETERIZATION N/A 12/24/2015   Procedure: Abdominal Aortogram;  Surgeon: Elam Dutch, MD;  Location: [place] CV LAB;  Service: Cardiovascular;  Laterality: N/A;   PERIPHERAL VASCULAR CATHETERIZATION Left 12/24/2015   Procedure: Peripheral Vascular Balloon Angioplasty;  Surgeon: Elam Dutch, MD;  Location: [place] CV LAB;  Service: Cardiovascular;  Laterality: Left;  drug coated balloon   PERIPHERAL VASCULAR CATHETERIZATION N/A 06/30/2016   Procedure: Abdominal Aortogram;  Surgeon: Elam Dutch, MD;  Location: [place] CV LAB;  Service: Cardiovascular;  Laterality: N/A;   PERIPHERAL VASCULAR CATHETERIZATION Bilateral 06/30/2016   Procedure: Lower Extremity Angiography;  Surgeon: Elam Dutch, MD;  Location: [place] CV LAB;  Service: Cardiovascular;  Laterality: Bilateral;   TONSILLECTOMY       reports that she quit smoking about 4 years ago. Her smoking use included cigarettes. She smoked 0.10 packs per day. She has never used smokeless tobacco. She reports current alcohol use. She reports that she does not use drugs.  Allergies  Allergen Reactions   Adhesive [Tape] Other (See Comments)    Pt states that tape and electrodes leave red "scars" on her skin and her skin is very sensitive.    Family History  Problem Relation Age of Onset   Heart disease Father    Heart attack Father        MI at age 55   Hypertension Mother    Alzheimer's disease Mother    Hypertension Sister    Diabetes Brother    Hyperlipidemia Brother    Hypertension  Brother    Heart disease Brother    Heart attack Brother    Breast cancer Maternal Aunt     Prior to Admission medications   Medication Sig Start Date End Date Taking? Authorizing Provider  amLODipine (NORVASC) 5 MG tablet Take 5 mg by mouth daily.  10/24/16  Yes [provider]  aspirin EC 81 MG tablet Take 81 mg by mouth at bedtime.    Yes [provider]  bismuth subsalicylate (PEPTO BISMOL) 262 MG/15ML suspension Take 30 mLs by mouth every 6 (six) hours as needed for indigestion or diarrhea or loose stools. Reported on 11/04/2015   Yes [provider]  famotidine (PEPCID) 20 MG tablet Take 1 tablet (20 mg total) by mouth 2 (two) times daily. 08/22/19  Yes Carlisle Cater, PA-C  gabapentin (NEURONTIN) 300 MG capsule TAKE 1 CAPSULE BY MOUTH THREE TIMES A DAY Patient taking differently: Take 300 mg by mouth 3 (three) times daily.  08/18/19  Yes Elam Dutch, MD  HYDROcodone-acetaminophen (NORCO/VICODIN) 5-325 MG tablet Take 0.5-1 tablets every 6 hours as needed for severe pain 08/22/19  Yes Carlisle Cater, PA-C  lisinopril (ZESTRIL) 5 MG tablet Take 5 mg by mouth daily. 08/11/19  Yes [provider]  lovastatin (MEVACOR) 20 MG tablet Take 20 mg by mouth every evening.    Yes [provider]  metFORMIN (GLUCOPHAGE) 1000 MG tablet Take 1 tablet (1,000 mg total) by mouth 2 (two) times daily with a meal. 08/04/19  Yes Renato Shin, MD  methocarbamol (ROBAXIN) 500 MG tablet Take 1 tablet (500 mg total) by mouth 2 (two) times daily. 03/09/18  Yes Ocie Cornfield T, PA-C  metoprolol succinate (TOPROL-XL) 100 MG 24 hr tablet Take 100 mg by mouth daily.  01/10/17  Yes [provider]  Netarsudil-Latanoprost (ROCKLATAN) 0.02-0.005 % SOLN Apply to eye.   Yes [provider]  OVER THE COUNTER MEDICATION Apply 1 patch topically daily as needed (pain). Apply to right shoulder   Yes [provider]  pantoprazole (PROTONIX) 40 MG tablet  Take 40 mg by mouth daily. 10/25/15  Yes [provider]  PROAIR HFA 108 (90 BASE) MCG/ACT inhaler Inhale 2 puffs into the lungs every 4 (four) hours as needed for wheezing or shortness of breath. Reported on 11/04/2015 09/02/13  Yes [provider]  repaglinide (PRANDIN) 1 MG tablet Take 1 tablet (1 mg total) by mouth 2 (two) times daily before a meal. 06/03/19  Yes Renato Shin, MD  sucralfate (CARAFATE) 1 GM/10ML suspension Take 10 mLs (1 g total) by mouth 4 (four) times daily -  with meals and at bedtime. 08/22/19  Yes Carlisle Cater, PA-C  temazepam (RESTORIL) 30 MG capsule Take 30 mg by mouth at bedtime as needed for sleep. 04/15/18  Yes [provider]  Accu-Chek Softclix Lancets lancets 1 each by Other route 3 (three) times daily. Use to monitor glucose levels TID; E11.9    [provider]  glucose blood (ACCU-CHEK AVIVA PLUS) test strip 1 each by Other route 3 (three) times daily. 06/03/19   Renato Shin, MD    Physical Exam: Constitutional: Moderately built and nourished. Vitals:   09/02/19 0200 09/02/19 0300 09/02/19 0330 09/02/19 0400  BP: 136/80 129/82 (!) 141/70 132/69  Pulse: (!) 57 60 (!) 58 (!) 57  Resp: (!) 23 20 14 12   Temp:      TempSrc:      SpO2: 94% 99% 95% 96%   Eyes: Anicteric no pallor. ENMT: No discharge from the ears eyes nose or mouth. Neck: No mass felt.  No neck rigidity.  No JVD appreciated. Respiratory: No rhonchi or crepitations. Cardiovascular: S1-S2 heard. Abdomen: Soft nontender bowel sounds present. Musculoskeletal: No edema. Skin: No rash. Neurologic: Alert awake oriented to time place and person.  Moves all extremities. Psychiatric: Appears normal.   Labs on Admission: I have personally reviewed following labs and imaging studies  CBC: Recent Labs  Lab 09/01/19 1803  WBC 7.9  HGB 13.9  HCT 42.4  MCV 93.4  PLT [id number]   Basic Metabolic Panel: Recent Labs  Lab 09/01/19 1803  NA 136  K 3.8  CL 100   CO2 23  GLUCOSE 160*  BUN 10  CREATININE 0.65  CALCIUM 9.8   GFR: CrCl cannot be calculated (Unknown ideal weight.). Liver Function Tests: Recent Labs  Lab 09/01/19 1803  AST 17  ALT 18  ALKPHOS 63  BILITOT 0.4  PROT 7.8  ALBUMIN 4.0  Recent Labs  Lab 09/01/19 1803  LIPASE 63*   No results for input(s): AMMONIA in the last 168 hours. Coagulation Profile: No results for input(s): INR, PROTIME in the last 168 hours. Cardiac Enzymes: No results for input(s): CKTOTAL, CKMB, CKMBINDEX, TROPONINI in the last 168 hours. BNP (last 3 results) No results for input(s): PROBNP in the last 8760 hours. HbA1C: No results for input(s): HGBA1C in the last 72 hours. CBG: No results for input(s): GLUCAP in the last 168 hours. Lipid Profile: No results for input(s): CHOL, HDL, LDLCALC, TRIG, CHOLHDL, LDLDIRECT in the last 72 hours. Thyroid Function Tests: No results for input(s): TSH, T4TOTAL, FREET4, T3FREE, THYROIDAB in the last 72 hours. Anemia Panel: No results for input(s): VITAMINB12, FOLATE, FERRITIN, TIBC, IRON, RETICCTPCT in the last 72 hours. Urine analysis:    Component Value Date/Time   COLORURINE AMBER (A) 08/22/2019 2006   APPEARANCEUR CLOUDY (A) 08/22/2019 2006   LABSPEC 1.029 08/22/2019 2006   PHURINE 5.0 08/22/2019 2006   GLUCOSEU NEGATIVE 08/22/2019 2006   HGBUR NEGATIVE 08/22/2019 2006   BILIRUBINUR SMALL (A) 08/22/2019 2006   [place] NEGATIVE 08/22/2019 2006   PROTEINUR 30 (A) 08/22/2019 2006   UROBILINOGEN 0.2 09/29/2013 1359   NITRITE NEGATIVE 08/22/2019 2006   LEUKOCYTESUR NEGATIVE 08/22/2019 2006   Sepsis Labs: @LABRCNTIP (procalcitonin:4,lacticidven:4) )No results found for this or any previous visit (from the past 240 hour(s)).   Radiological Exams on Admission: Dg Chest 2 View  Result Date: 09/01/2019 CLINICAL DATA:  Chest pain. EXAM: CHEST - 2 VIEW COMPARISON:  06/05/2018 FINDINGS: The heart size and mediastinal contours are within normal  limits. Both lungs are clear. The visualized skeletal structures are unremarkable. IMPRESSION: No active cardiopulmonary disease. Electronically Signed   By: Kerby Moors M.D.   On: 09/01/2019 18:16   Ct Abdomen Pelvis W Contrast  Result Date: 09/02/2019 CLINICAL DATA:  Chest pain abdominal pain EXAM: CT ABDOMEN AND PELVIS WITH CONTRAST TECHNIQUE: Multidetector CT imaging of the abdomen and pelvis was performed using the standard protocol following bolus administration of intravenous contrast. CONTRAST:  169mL OMNIPAQUE IOHEXOL 300 MG/ML  SOLN COMPARISON:  CT 08/22/2019 FINDINGS: Lower chest: Lung bases demonstrate no acute consolidation or effusion. The heart size is normal. Coronary vascular calcification. Hepatobiliary: No calcified gallstone. No focal hepatic abnormality. Enlarged extrahepatic common bile duct up to 10 mm with tapering at the head of the pancreas. Pancreas: Mild ductal dilatation. Indistinct appearance of the pancreatic head and uncinate process with mild soft tissue stranding suspicious for an acute pancreatitis. Spleen: Normal in size without focal abnormality. Adrenals/Urinary Tract: Adrenal glands are normal. Kidneys show no hydronephrosis. The bladder is unremarkable Stomach/Bowel: Stomach is nonenlarged. No dilated small bowel. No bowel wall thickening. Appendix contains small stone but is otherwise normal. Vascular/Lymphatic: Moderate aortic atherosclerosis without aneurysm. No significant adenopathy soft tissue thickening and presumed postsurgical changes at the common femoral vessels bilaterally. Reproductive: Uterus and bilateral adnexa are unremarkable. Other: Negative for free air or free fluid Musculoskeletal: No acute or suspicious osseous abnormality IMPRESSION: 1. Indistinct appearance of the pancreatic head and uncinate process consistent with acute pancreatitis. New mild pancreatic ductal dilatation. No necrosis or organized fluid collection. 2. Mild extrahepatic biliary  dilatation, suggest correlation with LFTs. Follow-up MRCP as indicated. Electronically Signed   By: Donavan Foil M.D.   On: 09/02/2019 01:35    EKG: Independently reviewed.  Normal sinus rhythm.  Assessment/Plan Principal Problem:   Acute pancreatitis Active Problems:   Hypertension   PAD (peripheral artery  disease) ([place])   S/P femoropopliteal bypass surgery   Type 2 diabetes mellitus with vascular disease ([place])    1. Acute pancreatitis -cause not clear.  Patient states he does not take alcohol.  No definite evidence of choledocholithiasis however he does show new mild pancreatic ductal dilatation and also extrahepatic biliary dilatation.  Will check MRCP.  Check triglyceride levels.  Keep patient n.p.o. and IV fluids and pain relief medications.  Patient was recently started on lisinopril not to the hospital. 2. Diabetes mellitus type 2 we will keep patient on sliding scale coverage. 3. Hypertension we will continue metoprolol and PRN IV hydralazine.  Hold lisinopril for now. 4. History of peripheral vascular disease no acute issues at this time.  COVID-19 test is pending.   DVT prophylaxis: SCDs until reviewed MRCP results. Code Status: Full code. Family Communication: Discussed with patient. Disposition Plan: Home. Consults called: None. Admission status: Observation.   Rise Patience MD [facility] Pager [phone].  If 7PM-7AM, please contact night-coverage [email or web address] Password [email or web address]  09/02/2019, 4:17 AM

## 2019-09-02 NOTE — ED Notes (Signed)
Pt transporting to floor with all pt belongings bags X2 and personal red bag. Pt verbalizes understanding of plan of care.

## 2019-09-02 NOTE — ED Notes (Signed)
Patient placed on pure wick, repositioned in bed, and clothes places in belonging bags. Patient updated on NPO until MRI. MRI called and will try to get patient in within a couple of hours.

## 2019-09-02 NOTE — ED Notes (Signed)
Patient transported to MRI 

## 2019-09-02 NOTE — ED Notes (Signed)
Attempted to call report to [place] will need 5 minutes. This RN will call back in 5 minutes.

## 2019-09-02 NOTE — ED Notes (Signed)
Commode at bedside

## 2019-09-02 NOTE — ED Notes (Signed)
Pt food tray rewarmed tomato soup provided. Pt tolerating liquids

## 2019-09-03 DIAGNOSIS — Z95828 Presence of other vascular implants and grafts: Secondary | ICD-10-CM

## 2019-09-03 DIAGNOSIS — K8689 Other specified diseases of pancreas: Secondary | ICD-10-CM

## 2019-09-03 LAB — GLUCOSE, CAPILLARY
Glucose-Capillary: 133 mg/dL — ABNORMAL HIGH (ref 70–99)
Glucose-Capillary: 143 mg/dL — ABNORMAL HIGH (ref 70–99)
Glucose-Capillary: 256 mg/dL — ABNORMAL HIGH (ref 70–99)
Glucose-Capillary: 257 mg/dL — ABNORMAL HIGH (ref 70–99)
Glucose-Capillary: 189 mg/dL — ABNORMAL HIGH (ref 70–99)

## 2019-09-03 MED ORDER — LACTATED RINGERS IV SOLN
INTRAVENOUS | Status: DC
  Administered 2019-09-03 – 2019-09-04 (×2): via INTRAVENOUS

## 2019-09-03 NOTE — Progress Notes (Signed)
PROGRESS NOTE    Jill Higgins  [id number] DOB: 12/18/1945 DOA: 09/01/2019 PCP: Carol Ada, MD   Brief Narrative:  73 year old with history of diabetes mellitus type 2, peripheral vascular disease status post bypass, essential hypertension presented with complaints of epigastric abdominal pain radiating to her back which has been ongoing for several days.  CT of the abdomen pelvis showed acute pancreatitis with ductal dilation extrahepatic ductal dilation.  MRCP showed acute pancreatitis along with poorly defined mass in the pancreatic head.   Assessment & Plan:   Principal Problem:   Acute pancreatitis Active Problems:   Hypertension   PAD (peripheral artery disease) (HCC)   S/P femoropopliteal bypass surgery   Type 2 diabetes mellitus with vascular disease (HCC)   Acute pancreatitis with ductal dilation Mass in the head of pancreas -No obvious evidence of choledocholithiasis. - Triglycerides-102 -MRCP reviewed -Gastroenterology consulted-EUS versus ERCP will be necessary -Check CA-19-9 - Diet as tolerated, pain control.  IV fluids.  Diabetes mellitus type 2 Peripheral neuropathy secondary to diabetes mellitus type 2 -Accu-Cheks and sliding scale -Gabapentin  Essential hypertension -Holding lisinopril.  Continue Norvasc metoprolol, PRN IV hydralazine  History of peripheral vascular disease status post bypass   DVT prophylaxis: SCDs Code Status: Full code Family Communication: Spoke with the patient's daughter Jocelyn Lamer over the phone Disposition Plan: Maintain hospital stay until her appetite is improved and her pancreatic mass has been addressed  Consultants:   None  Procedures:   None  Antimicrobials:   None   Subjective: States her epigastric pain is better.  Able to tolerate p.o. intake a little. Still not all the way at baseline.    Review of Systems Otherwise negative except as per HPI, including: General = no fevers, chills, dizziness,  malaise, fatigue HEENT/EYES = negative for pain, redness, loss of vision, double vision, blurred vision, loss of hearing, sore throat, hoarseness, dysphagia Cardiovascular= negative for chest pain, palpitation, murmurs, lower extremity swelling Respiratory/lungs= negative for shortness of breath, cough, hemoptysis, wheezing, mucus production Gastrointestinal= negative for nausea, vomiting,, abdominal pain, melena, hematemesis Genitourinary= negative for Dysuria, Hematuria, Change in Urinary Frequency MSK = Negative for arthralgia, myalgias, Back Pain, Joint swelling  Neurology= Negative for headache, seizures, numbness, tingling  Psychiatry= Negative for anxiety, depression, suicidal and homocidal ideation Allergy/Immunology= Medication/Food allergy as listed  Skin= Negative for Rash, lesions, ulcers, itching   Objective: Vitals:   09/02/19 1800 09/02/19 1850 09/02/19 2149 09/03/19 0413  BP: 129/74 107/61 139/67   Pulse: (!) 55 (!) 59 (!) 52   Resp: 18 16 16    Temp:  98.3 F (36.8 C) 97.7 F (36.5 C)   TempSrc:  Oral Oral   SpO2: 94% 96% 97%   Weight:   62.1 kg 66.2 kg  Height:   5\' 3"  (1.6 m)     Intake/Output Summary (Last 24 hours) at 09/03/2019 1052 Last data filed at 09/03/2019 [id number] Gross per 24 hour  Intake 1382.86 ml  Output 1700 ml  Net -317.14 ml   Filed Weights   09/02/19 2149 09/03/19 0413  Weight: 62.1 kg 66.2 kg    Examination:  Constitutional: NAD, calm, comfortable Eyes: PERRL, lids and conjunctivae normal ENMT: Mucous membranes are moist. Posterior pharynx clear of any exudate or lesions.Normal dentition.  Neck: normal, supple, no masses, no thyromegaly Respiratory: clear to auscultation bilaterally, no wheezing, no crackles. Normal respiratory effort. No accessory muscle use.  Cardiovascular: Regular rate and rhythm, no murmurs / rubs / gallops. No extremity edema. 2+ pedal pulses.  No carotid bruits.  Abdomen: no tenderness, no masses palpated. No  hepatosplenomegaly. Bowel sounds positive.  Musculoskeletal: no clubbing / cyanosis. No joint deformity upper and lower extremities. Good ROM, no contractures. Normal muscle tone.  Skin: no rashes, lesions, ulcers. No induration Neurologic: CN 2-12 grossly intact. Sensation intact, DTR normal. Strength 5/5 in all 4.  Psychiatric: Normal judgment and insight. Alert and oriented x 3. Normal mood.    Data Reviewed:   CBC: Recent Labs  Lab 09/01/19 1803 09/02/19 0448  WBC 7.9 6.4  NEUTROABS  --  3.2  HGB 13.9 13.0  HCT 42.4 40.3  MCV 93.4 95.3  PLT 379 [id number]   Basic Metabolic Panel: Recent Labs  Lab 09/01/19 1803 09/02/19 0448  NA 136 139  K 3.8 3.2*  CL 100 108  CO2 23 23  GLUCOSE 160* 128*  BUN 10 8  CREATININE 0.65 0.60  CALCIUM 9.8 7.8*   GFR: Estimated Creatinine Clearance: 56.4 mL/min (by C-G formula based on SCr of 0.6 mg/dL). Liver Function Tests: Recent Labs  Lab 09/01/19 1803 09/02/19 0448  AST 17 15  ALT 18 15  ALKPHOS 63 48  BILITOT 0.4 0.2*  PROT 7.8 5.9*  ALBUMIN 4.0 3.0*   Recent Labs  Lab 09/01/19 1803  LIPASE 63*   No results for input(s): AMMONIA in the last 168 hours. Coagulation Profile: No results for input(s): INR, PROTIME in the last 168 hours. Cardiac Enzymes: No results for input(s): CKTOTAL, CKMB, CKMBINDEX, TROPONINI in the last 168 hours. BNP (last 3 results) No results for input(s): PROBNP in the last 8760 hours. HbA1C: No results for input(s): HGBA1C in the last 72 hours. CBG: Recent Labs  Lab 09/02/19 1642 09/02/19 1957 09/02/19 2337 09/03/19 0326 09/03/19 0738  GLUCAP 130* 195* 140* 133* 143*   Lipid Profile: Recent Labs    09/02/19 0448  TRIG 102   Thyroid Function Tests: No results for input(s): TSH, T4TOTAL, FREET4, T3FREE, THYROIDAB in the last 72 hours. Anemia Panel: No results for input(s): VITAMINB12, FOLATE, FERRITIN, TIBC, IRON, RETICCTPCT in the last 72 hours. Sepsis Labs: No results for input(s):  PROCALCITON, LATICACIDVEN in the last 168 hours.  Recent Results (from the past 240 hour(s))  SARS CORONAVIRUS 2 (TAT 6-24 HRS) Nasopharyngeal Nasopharyngeal Swab     Status: None   Collection Time: 09/02/19  2:41 AM   Specimen: Nasopharyngeal Swab  Result Value Ref Range Status   SARS Coronavirus 2 NEGATIVE NEGATIVE Final    Comment: (NOTE) SARS-CoV-2 target nucleic acids are NOT DETECTED. The SARS-CoV-2 RNA is generally detectable in upper and lower respiratory specimens during the acute phase of infection. Negative results do not preclude SARS-CoV-2 infection, do not rule out co-infections with other pathogens, and should not be used as the sole basis for treatment or other patient management decisions. Negative results must be combined with clinical observations, patient history, and epidemiological information. The expected result is Negative. Fact Sheet for Patients: SugarRoll.be Fact Sheet for Healthcare Providers: https://www.woods-mathews.com/ This test is not yet approved or cleared by the [place] FDA and  has been authorized for detection and/or diagnosis of SARS-CoV-2 by FDA under an Emergency Use Authorization (EUA). This EUA will remain  in effect (meaning this test can be used) for the duration of the COVID-19 declaration under Section 56 4(b)(1) of the Act, 21 U.S.C. section 360bbb-3(b)(1), unless the authorization is terminated or revoked sooner. Performed at [facility], [place] [address], [place], [place] [address]  Radiology Studies: Dg Chest 2 View  Result Date: 09/01/2019 CLINICAL DATA:  Chest pain. EXAM: CHEST - 2 VIEW COMPARISON:  06/05/2018 FINDINGS: The heart size and mediastinal contours are within normal limits. Both lungs are clear. The visualized skeletal structures are unremarkable. IMPRESSION: No active cardiopulmonary disease. Electronically Signed   By: Kerby Moors M.D.   On:  09/01/2019 18:16   Ct Abdomen Pelvis W Contrast  Result Date: 09/02/2019 CLINICAL DATA:  Chest pain abdominal pain EXAM: CT ABDOMEN AND PELVIS WITH CONTRAST TECHNIQUE: Multidetector CT imaging of the abdomen and pelvis was performed using the standard protocol following bolus administration of intravenous contrast. CONTRAST:  126mL OMNIPAQUE IOHEXOL 300 MG/ML  SOLN COMPARISON:  CT 08/22/2019 FINDINGS: Lower chest: Lung bases demonstrate no acute consolidation or effusion. The heart size is normal. Coronary vascular calcification. Hepatobiliary: No calcified gallstone. No focal hepatic abnormality. Enlarged extrahepatic common bile duct up to 10 mm with tapering at the head of the pancreas. Pancreas: Mild ductal dilatation. Indistinct appearance of the pancreatic head and uncinate process with mild soft tissue stranding suspicious for an acute pancreatitis. Spleen: Normal in size without focal abnormality. Adrenals/Urinary Tract: Adrenal glands are normal. Kidneys show no hydronephrosis. The bladder is unremarkable Stomach/Bowel: Stomach is nonenlarged. No dilated small bowel. No bowel wall thickening. Appendix contains small stone but is otherwise normal. Vascular/Lymphatic: Moderate aortic atherosclerosis without aneurysm. No significant adenopathy soft tissue thickening and presumed postsurgical changes at the common femoral vessels bilaterally. Reproductive: Uterus and bilateral adnexa are unremarkable. Other: Negative for free air or free fluid Musculoskeletal: No acute or suspicious osseous abnormality IMPRESSION: 1. Indistinct appearance of the pancreatic head and uncinate process consistent with acute pancreatitis. New mild pancreatic ductal dilatation. No necrosis or organized fluid collection. 2. Mild extrahepatic biliary dilatation, suggest correlation with LFTs. Follow-up MRCP as indicated. Electronically Signed   By: Donavan Foil M.D.   On: 09/02/2019 01:35   Mr 3d Recon At Scanner  Result  Date: 09/02/2019 CLINICAL DATA:  Acute pancreatitis. EXAM: MRI ABDOMEN WITHOUT AND WITH CONTRAST (INCLUDING MRCP) TECHNIQUE: Multiplanar multisequence MR imaging of the abdomen was performed both before and after the administration of intravenous contrast. Heavily T2-weighted images of the biliary and pancreatic ducts were obtained, and three-dimensional MRCP images were rendered by post processing. CONTRAST:  74mL GADAVIST GADOBUTROL 1 MMOL/ML IV SOLN COMPARISON:  CT scan 09/01/2019 FINDINGS: Lower chest: Unremarkable. Hepatobiliary: No focal abnormality within the liver parenchyma. Gallbladder is distended with layering sludge. Mild intrahepatic biliary duct dilatation associated with extrahepatic common duct measuring 10 mm diameter. Pancreas: The main pancreatic duct is dilated up to 3-4 mm diameter with an abrupt cut off in the head of the pancreas (see axial T2 image 28/series 4). Masslike thickening noted in the head of the pancreas. No substantial peripancreatic edema. Spleen:  No splenomegaly. No focal mass lesion. Adrenals/Urinary Tract: No adrenal nodule or mass. Kidneys unremarkable. Stomach/Bowel: Stomach is unremarkable. No gastric wall thickening. No evidence of outlet obstruction. Duodenum is normally positioned as is the ligament of Treitz. No small bowel or colonic dilatation within the visualized abdomen. Vascular/Lymphatic: Motion degraded, but there does appear to be some mild mass-effect on the portal splenic confluence. Portal vein, superior mesenteric vein, and splenic vein are patent. Other:  No intraperitoneal free fluid. Musculoskeletal: No abnormal marrow enhancement within the visualized bony anatomy. IMPRESSION: 1. Masslike fullness in the head of the pancreas with abrupt cut off of the main pancreatic duct at the level of the pancreatic head.  This is associated with biliary dilatation. No substantial peripancreatic edema/inflammation. Imaging features would be quite focal for  pancreatitis and ill-defined, poorly demonstrated mass lesion in the head of the pancreas is a real concern. Upper endoscopy would likely prove helpful to further evaluate. 2. Gallbladder distention with layering sludge. No definite gallstones. Electronically Signed   By: Misty Stanley M.D.   On: 09/02/2019 16:25   Mr Abdomen Mrcp Moise Boring Contast  Result Date: 09/02/2019 CLINICAL DATA:  Acute pancreatitis. EXAM: MRI ABDOMEN WITHOUT AND WITH CONTRAST (INCLUDING MRCP) TECHNIQUE: Multiplanar multisequence MR imaging of the abdomen was performed both before and after the administration of intravenous contrast. Heavily T2-weighted images of the biliary and pancreatic ducts were obtained, and three-dimensional MRCP images were rendered by post processing. CONTRAST:  51mL GADAVIST GADOBUTROL 1 MMOL/ML IV SOLN COMPARISON:  CT scan 09/01/2019 FINDINGS: Lower chest: Unremarkable. Hepatobiliary: No focal abnormality within the liver parenchyma. Gallbladder is distended with layering sludge. Mild intrahepatic biliary duct dilatation associated with extrahepatic common duct measuring 10 mm diameter. Pancreas: The main pancreatic duct is dilated up to 3-4 mm diameter with an abrupt cut off in the head of the pancreas (see axial T2 image 28/series 4). Masslike thickening noted in the head of the pancreas. No substantial peripancreatic edema. Spleen:  No splenomegaly. No focal mass lesion. Adrenals/Urinary Tract: No adrenal nodule or mass. Kidneys unremarkable. Stomach/Bowel: Stomach is unremarkable. No gastric wall thickening. No evidence of outlet obstruction. Duodenum is normally positioned as is the ligament of Treitz. No small bowel or colonic dilatation within the visualized abdomen. Vascular/Lymphatic: Motion degraded, but there does appear to be some mild mass-effect on the portal splenic confluence. Portal vein, superior mesenteric vein, and splenic vein are patent. Other:  No intraperitoneal free fluid. Musculoskeletal:  No abnormal marrow enhancement within the visualized bony anatomy. IMPRESSION: 1. Masslike fullness in the head of the pancreas with abrupt cut off of the main pancreatic duct at the level of the pancreatic head. This is associated with biliary dilatation. No substantial peripancreatic edema/inflammation. Imaging features would be quite focal for pancreatitis and ill-defined, poorly demonstrated mass lesion in the head of the pancreas is a real concern. Upper endoscopy would likely prove helpful to further evaluate. 2. Gallbladder distention with layering sludge. No definite gallstones. Electronically Signed   By: Misty Stanley M.D.   On: 09/02/2019 16:25        Scheduled Meds:  amLODipine  5 mg Oral Daily   famotidine  20 mg Oral BID   gabapentin  300 mg Oral TID   insulin aspart  0-9 Units Subcutaneous Q4H   metoprolol succinate  100 mg Oral Daily   pantoprazole  40 mg Oral QAC breakfast   sodium chloride flush  3 mL Intravenous Once   sucralfate  1 g Oral TID WC & HS   Continuous Infusions:    LOS: 1 day   Time spent= 45 mins    Analeise Mccleery Arsenio Loader, MD [facility]  If 7PM-7AM, please contact night-coverage [email or web address] 09/03/2019, 10:52 AM

## 2019-09-03 NOTE — Consult Note (Signed)
[facility] Consult  Referring Provider: Damita Lack, MD Primary Care Physician:  Carol Ada, MD Primary Gastroenterologist: Dr.Ganem/[facility]  Reason for Consultation: Masslike fullness of pancreatic head with pancreatic duct and biliary dilatation  HPI: Jill Higgins is a 73 y.o. female was in her usual state of health until 08/22/2019 when she presented to the ER with epigastric pain radiating to the back and had a CAT scan which showed changes of pancreatitis, mild thickening of gastric antrum with reactive lymph nodes, lipase was 87, patient was given sucralfate, famotidine, hydrocodone and discharged from the ED. She continued to have epigastric pain which radiated to her back and she presented to the ED yesterday.  Repeat CAT scan from 09/01/2019 showed mild ductal dilatation, indistinct appearance of pancreatic head and uncinate process, extra hepatic CBD of 10 mm with tapering at head of pancreas. Follow-up MRCP from 09/02/2019 showed masslike fullness in head of pancreas with abrupt cut off of main pancreatic duct at the level of pancreatic head associated with biliary dilatation.  Possible focal pancreatitis and ill-defined poorly demonstrated mass lesion in the head of pancreas is a real concern. Patient noted to have gallbladder distention with layering sludge without gallstones.  Patient denies unintentional weight loss. She has not noted yellowish discoloration of skin/sclera or change in the color of stool or urine. She drinks alcohol perhaps once a week, last alcohol use was 3 weeks ago where she had 1 cocktail.  Patient was recently started on lisinopril on 08/11/2019 and  was started on repaglinide/Prandin on 06/03/2019, both of which are known to cause pancreatitis.  Patient was last seen in the office by Dr. Kizzie Bane in 02/2018 and recommended to continue PPI therapy for acid reflux. Last colonoscopy was in 2013, reported as normal and follow-up recommended  in 10 years.   Past Medical History:  Diagnosis Date  . Bronchitis   . Bulging disc    in neck  . Depression with anxiety   . Diabetes mellitus    Type II  . Fibromyalgia    pt. denies  . GERD (gastroesophageal reflux disease)   . Glaucoma   . History of bronchitis   . Hyperlipidemia   . Hypertension   . Overactive bladder   . Peripheral vascular disease ([place])   . Pneumonia   . Sleep apnea    recent test negative for sleep apnea  . Tobacco abuse   . Urinary frequency     Past Surgical History:  Procedure Laterality Date  . ABDOMINAL AORTAGRAM N/A 08/15/2013   Procedure: ABDOMINAL Maxcine Ham;  Surgeon: Elam Dutch, MD;  Location: [facility] CATH LAB;  Service: Cardiovascular;  Laterality: N/A;  . ABDOMINAL AORTAGRAM N/A 06/19/2014   Procedure: ABDOMINAL Maxcine Ham;  Surgeon: Elam Dutch, MD;  Location: [facility] CATH LAB;  Service: Cardiovascular;  Laterality: N/A;  . ABDOMINAL AORTOGRAM W/LOWER EXTREMITY N/A 05/10/2018   Procedure: ABDOMINAL AORTOGRAM W/LOWER EXTREMITY;  Surgeon: Elam Dutch, MD;  Location: [place] CV LAB;  Service: Cardiovascular;  Laterality: N/A;  bilateral  . Aortogram w/ PTA  05/14/08,  11-04-10   Bilateral aortogram w/ bilateral  SFA PTA  stenting   . BREAST SURGERY Right    boil removal  . BUNIONECTOMY     L foot in the 1980s  . COLONOSCOPY     2014  . DILATION AND CURETTAGE OF UTERUS     X4  . ENDARTERECTOMY FEMORAL Right 06/03/2018   Procedure: RIGHT FEMORAL ENDARTERECTOMY;  Surgeon: Ruta Hinds  E, MD;  Location: [facility] OR;  Service: Vascular;  Laterality: Right;  . Epidural shots in neck     . EYE SURGERY Bilateral 2016   cataract removal  . FEMORAL-POPLITEAL BYPASS GRAFT Left 10/21/2013   Procedure: LEFT FEMORAL-POPLITEAL ARTERY BYPASS WITH SAPHENOUS VEIN GRAFT , POPLITEAL ENDARTERECTOMY ,INTRAOPERATIVE ARTERIOGRAM, vein patch angioplasty to popliteal artery;  Surgeon: Elam Dutch, MD;  Location: [facility] OR;  Service: Vascular;  Laterality:  Left;  . FEMORAL-POPLITEAL BYPASS GRAFT Right 02/08/2016   Procedure: Right FEMORAL- to Above Knee POPLITEAL ARTERY Bypass Graft with reversed saphenous vein and Common Femoral Endarterectomy  with profundoplasty;  Surgeon: Elam Dutch, MD;  Location: [place];  Service: Vascular;  Laterality: Right;  . GROIN DEBRIDEMENT Left 10/28/2013   Procedure: left inner thigh DEBRIDEMENT;  Surgeon: Elam Dutch, MD;  Location: [place];  Service: Vascular;  Laterality: Left;  . HAND SURGERY Left 2010  . LOWER EXTREMITY ANGIOGRAM Bilateral 12/24/2015   Procedure: Lower Extremity Angiogram;  Surgeon: Elam Dutch, MD;  Location: [place] CV LAB;  Service: Cardiovascular;  Laterality: Bilateral;  . PATCH ANGIOPLASTY Right 06/03/2018   Procedure: PATCH ANGIOPLASTY USING A XENOSURE 1CM X 14CM BIOLOGIC PATCH;  Surgeon: Elam Dutch, MD;  Location: [place];  Service: Vascular;  Laterality: Right;  . PERIPHERAL VASCULAR CATHETERIZATION N/A 12/24/2015   Procedure: Abdominal Aortogram;  Surgeon: Elam Dutch, MD;  Location: [place] CV LAB;  Service: Cardiovascular;  Laterality: N/A;  . PERIPHERAL VASCULAR CATHETERIZATION Left 12/24/2015   Procedure: Peripheral Vascular Balloon Angioplasty;  Surgeon: Elam Dutch, MD;  Location: [place] CV LAB;  Service: Cardiovascular;  Laterality: Left;  drug coated balloon  . PERIPHERAL VASCULAR CATHETERIZATION N/A 06/30/2016   Procedure: Abdominal Aortogram;  Surgeon: Elam Dutch, MD;  Location: [place] CV LAB;  Service: Cardiovascular;  Laterality: N/A;  . PERIPHERAL VASCULAR CATHETERIZATION Bilateral 06/30/2016   Procedure: Lower Extremity Angiography;  Surgeon: Elam Dutch, MD;  Location: [place] CV LAB;  Service: Cardiovascular;  Laterality: Bilateral;  . TONSILLECTOMY      Prior to Admission medications   Medication Sig Start Date End Date Taking? Authorizing Provider  amLODipine (NORVASC) 5 MG tablet Take 5 mg by mouth daily.  10/24/16   Yes [provider]  aspirin EC 81 MG tablet Take 81 mg by mouth at bedtime.    Yes [provider]  bismuth subsalicylate (PEPTO BISMOL) 262 MG/15ML suspension Take 30 mLs by mouth every 6 (six) hours as needed for indigestion or diarrhea or loose stools. Reported on 11/04/2015   Yes [provider]  famotidine (PEPCID) 20 MG tablet Take 1 tablet (20 mg total) by mouth 2 (two) times daily. 08/22/19  Yes Carlisle Cater, PA-C  gabapentin (NEURONTIN) 300 MG capsule TAKE 1 CAPSULE BY MOUTH THREE TIMES A DAY Patient taking differently: Take 300 mg by mouth 3 (three) times daily.  08/18/19  Yes FieldsJessy Oto, MD  HYDROcodone-acetaminophen (NORCO/VICODIN) 5-325 MG tablet Take 0.5-1 tablets every 6 hours as needed for severe pain 08/22/19  Yes Carlisle Cater, PA-C  lisinopril (ZESTRIL) 5 MG tablet Take 5 mg by mouth daily. 08/11/19  Yes [provider]  lovastatin (MEVACOR) 20 MG tablet Take 20 mg by mouth every evening.    Yes [provider]  metFORMIN (GLUCOPHAGE) 1000 MG tablet Take 1 tablet (1,000 mg total) by mouth 2 (two) times daily with a meal. 08/04/19  Yes Renato Shin, MD  methocarbamol (ROBAXIN) 500  MG tablet Take 1 tablet (500 mg total) by mouth 2 (two) times daily. 03/09/18  Yes Ocie Cornfield T, PA-C  metoprolol succinate (TOPROL-XL) 100 MG 24 hr tablet Take 100 mg by mouth daily.  01/10/17  Yes [provider]  Netarsudil-Latanoprost (ROCKLATAN) 0.02-0.005 % SOLN Apply to eye.   Yes [provider]  OVER THE COUNTER MEDICATION Apply 1 patch topically daily as needed (pain). Apply to right shoulder   Yes [provider]  pantoprazole (PROTONIX) 40 MG tablet Take 40 mg by mouth daily. 10/25/15  Yes [provider]  PROAIR HFA 108 (90 BASE) MCG/ACT inhaler Inhale 2 puffs into the lungs every 4 (four) hours as needed for wheezing or shortness of breath. Reported on 11/04/2015 09/02/13  Yes [provider]   repaglinide (PRANDIN) 1 MG tablet Take 1 tablet (1 mg total) by mouth 2 (two) times daily before a meal. 06/03/19  Yes Renato Shin, MD  sucralfate (CARAFATE) 1 GM/10ML suspension Take 10 mLs (1 g total) by mouth 4 (four) times daily -  with meals and at bedtime. 08/22/19  Yes Carlisle Cater, PA-C  temazepam (RESTORIL) 30 MG capsule Take 30 mg by mouth at bedtime as needed for sleep. 04/15/18  Yes [provider]  Accu-Chek Softclix Lancets lancets 1 each by Other route 3 (three) times daily. Use to monitor glucose levels TID; E11.9    [provider]  glucose blood (ACCU-CHEK AVIVA PLUS) test strip 1 each by Other route 3 (three) times daily. 06/03/19   Renato Shin, MD    Current Facility-Administered Medications  Medication Dose Route Frequency Provider Last Rate Last Dose  . acetaminophen (TYLENOL) tablet 650 mg  650 mg Oral Q6H PRN Rise Patience, MD       Or  . acetaminophen (TYLENOL) suppository 650 mg  650 mg Rectal Q6H PRN Rise Patience, MD      . albuterol (PROVENTIL) (2.5 MG/3ML) 0.083% nebulizer solution 2.5 mg  2.5 mg Nebulization Q4H PRN Thomes Lolling, RPH      . ALPRAZolam Duanne Moron) tablet 0.5 mg  0.5 mg Oral TID PRN Damita Lack, MD   0.5 mg at 09/02/19 1447  . amLODipine (NORVASC) tablet 5 mg  5 mg Oral Daily Amin, Ankit Chirag, MD   5 mg at 09/03/19 0924  . famotidine (PEPCID) tablet 20 mg  20 mg Oral BID Amin, Ankit Chirag, MD   20 mg at 09/03/19 0924  . gabapentin (NEURONTIN) capsule 300 mg  300 mg Oral TID Amin, Ankit Chirag, MD   300 mg at 09/03/19 0924  . hydrALAZINE (APRESOLINE) injection 10 mg  10 mg Intravenous Q4H PRN Rise Patience, MD      . insulin aspart (novoLOG) injection 0-9 Units  0-9 Units Subcutaneous Q4H Rise Patience, MD   5 Units at 09/03/19 1157  . metoprolol succinate (TOPROL-XL) 24 hr tablet 100 mg  100 mg Oral Daily Rise Patience, MD   100 mg at 09/03/19 [id number]  . morphine 2 MG/ML injection 1 mg   1 mg Intravenous Q2H PRN Rise Patience, MD   1 mg at 09/02/19 1858  . ondansetron (ZOFRAN) tablet 4 mg  4 mg Oral Q6H PRN Rise Patience, MD       Or  . ondansetron [facility]) injection 4 mg  4 mg Intravenous Q6H PRN Rise Patience, MD      . oxyCODONE (Oxy IR/ROXICODONE) immediate release tablet 5 mg  5 mg  Oral Q4H PRN Damita Lack, MD   5 mg at 09/03/19 0735  . pantoprazole (PROTONIX) EC tablet 40 mg  40 mg Oral QAC breakfast Amin, Ankit Chirag, MD   40 mg at 09/03/19 0801  . polyethylene glycol (MIRALAX / GLYCOLAX) packet 17 g  17 g Oral Daily PRN Amin, Ankit Chirag, MD      . senna-docusate (Senokot-S) tablet 2 tablet  2 tablet Oral QHS PRN Amin, Ankit Chirag, MD      . sodium chloride flush (NS) 0.9 % injection 3 mL  3 mL Intravenous Once Rise Patience, MD      . sucralfate (CARAFATE) 1 GM/10ML suspension 1 g  1 g Oral TID WC & HS Amin, Ankit Chirag, MD   1 g at 09/03/19 1156    Allergies as of 09/01/2019 - Review Complete 09/01/2019  Allergen Reaction Noted  . Adhesive [tape] Other (See Comments) 11/24/2015    Family History  Problem Relation Age of Onset  . Heart disease Father   . Heart attack Father        MI at age 74  . Hypertension Mother   . Alzheimer's disease Mother   . Hypertension Sister   . Diabetes Brother   . Hyperlipidemia Brother   . Hypertension Brother   . Heart disease Brother   . Heart attack Brother   . Breast cancer Maternal Aunt     Social History   Socioeconomic History  . Marital status: Single    Spouse name: Not on file  . Number of children: 1  . Years of education: Not on file  . Highest education level: Not on file  Occupational History  . Occupation: Retired    Fish farm manager: Chief of Staff  . Financial resource strain: Not on file  . Food insecurity    Worry: Not on file    Inability: Not on file  . Transportation needs    Medical: Not on file    Non-medical: Not on file  Tobacco Use  . Smoking  status: Former Smoker    Packs/day: 0.10    Types: Cigarettes    Quit date: 10/2014    Years since quitting: 4.8  . Smokeless tobacco: Never Used  . Tobacco comment: pt states that she is using the E cig only   Substance and Sexual Activity  . Alcohol use: Yes    Alcohol/week: 0.0 - 1.0 standard drinks    Comment: Occasional  . Drug use: No  . Sexual activity: Not on file  Lifestyle  . Physical activity    Days per week: Not on file    Minutes per session: Not on file  . Stress: Not on file  Relationships  . Social Herbalist on phone: Not on file    Gets together: Not on file    Attends religious service: Not on file    Active member of club or organization: Not on file    Attends meetings of clubs or organizations: Not on file    Relationship status: Not on file  . Intimate partner violence    Fear of current or ex partner: Not on file    Emotionally abused: Not on file    Physically abused: Not on file    Forced sexual activity: Not on file  Other Topics Concern  . Not on file  Social History Narrative   Drinks about 2 cups of coffee a day, and some tea  Review of Systems: Positive for: GI: Described in detail in HPI.    Gen: Denies any fever, chills, rigors, night sweats, anorexia, fatigue, weakness, malaise, involuntary weight loss, and sleep disorder CV: Denies chest pain, angina, palpitations, syncope, orthopnea, PND, peripheral edema, and claudication. Resp: Denies dyspnea, cough, sputum, wheezing, coughing up blood. GU : Denies urinary burning, blood in urine, urinary frequency, urinary hesitancy, nocturnal urination, and urinary incontinence. MS: Denies joint pain or swelling.  Denies muscle weakness, cramps, atrophy.  Derm: Denies rash, itching, oral ulcerations, hives, unhealing ulcers.  Psych: Denies depression, anxiety, memory loss, suicidal ideation, hallucinations,  and confusion. Heme: Denies bruising, bleeding, and enlarged lymph  nodes. Neuro:  Denies any headaches, dizziness, paresthesias. Endo:  DM, Denies any problems with thyroid, adrenal function.  Physical Exam: Vital signs in last 24 hours: Temp:  [97.7 F (36.5 C)-98.3 F (36.8 C)] 97.7 F (36.5 C) (10/13 2149) Pulse Rate:  [52-105] 52 (10/13 2149) Resp:  [13-18] 16 (10/13 2149) BP: (100-161)/(61-126) 139/67 (10/13 2149) SpO2:  [93 %-99 %] 97 % (10/13 2149) Weight:  [62.1 kg-66.2 kg] 66.2 kg (10/14 0413) Last BM Date: 09/01/19  General:   Alert,  Well-developed, well-nourished, pleasant and cooperative in NAD Head:  Normocephalic and atraumatic. Eyes:  Sclera clear, no icterus.   Conjunctiva pink. Ears:  Normal auditory acuity. Nose:  No deformity, discharge,  or lesions. Mouth:  No deformity or lesions.  Oropharynx pink & moist. Neck:  Supple; no masses or thyromegaly. Lungs:  Clear throughout to auscultation.   No wheezes, crackles, or rhonchi. No acute distress. Heart:  Regular rate and rhythm; no murmurs, clicks, rubs,  or gallops. Extremities:  Without clubbing or edema. Neurologic:  Alert and  oriented x4;  grossly normal neurologically. Skin:  Intact without significant lesions or rashes. Psych:  Alert and cooperative. Normal mood and affect. Abdomen:  Soft, nontender and nondistended. No masses, hepatosplenomegaly or hernias noted. Normal bowel sounds, without guarding, and without rebound.         Lab Results: Recent Labs    09/01/19 1803 09/02/19 0448  WBC 7.9 6.4  HGB 13.9 13.0  HCT 42.4 40.3  PLT 379 320   BMET Recent Labs    09/01/19 1803 09/02/19 0448  NA 136 139  K 3.8 3.2*  CL 100 108  CO2 23 23  GLUCOSE 160* 128*  BUN 10 8  CREATININE 0.65 0.60  CALCIUM 9.8 7.8*   LFT Recent Labs    09/02/19 0448  PROT 5.9*  ALBUMIN 3.0*  AST 15  ALT 15  ALKPHOS 48  BILITOT 0.2*  BILIDIR 0.1  IBILI 0.1*   PT/INR No results for input(s): LABPROT, INR in the last 72 hours.  Studies/Results: Dg Chest 2  View  Result Date: 09/01/2019 CLINICAL DATA:  Chest pain. EXAM: CHEST - 2 VIEW COMPARISON:  06/05/2018 FINDINGS: The heart size and mediastinal contours are within normal limits. Both lungs are clear. The visualized skeletal structures are unremarkable. IMPRESSION: No active cardiopulmonary disease. Electronically Signed   By: Kerby Moors M.D.   On: 09/01/2019 18:16   Ct Abdomen Pelvis W Contrast  Result Date: 09/02/2019 CLINICAL DATA:  Chest pain abdominal pain EXAM: CT ABDOMEN AND PELVIS WITH CONTRAST TECHNIQUE: Multidetector CT imaging of the abdomen and pelvis was performed using the standard protocol following bolus administration of intravenous contrast. CONTRAST:  127mL OMNIPAQUE IOHEXOL 300 MG/ML  SOLN COMPARISON:  CT 08/22/2019 FINDINGS: Lower chest: Lung bases demonstrate no acute consolidation or effusion. The  heart size is normal. Coronary vascular calcification. Hepatobiliary: No calcified gallstone. No focal hepatic abnormality. Enlarged extrahepatic common bile duct up to 10 mm with tapering at the head of the pancreas. Pancreas: Mild ductal dilatation. Indistinct appearance of the pancreatic head and uncinate process with mild soft tissue stranding suspicious for an acute pancreatitis. Spleen: Normal in size without focal abnormality. Adrenals/Urinary Tract: Adrenal glands are normal. Kidneys show no hydronephrosis. The bladder is unremarkable Stomach/Bowel: Stomach is nonenlarged. No dilated small bowel. No bowel wall thickening. Appendix contains small stone but is otherwise normal. Vascular/Lymphatic: Moderate aortic atherosclerosis without aneurysm. No significant adenopathy soft tissue thickening and presumed postsurgical changes at the common femoral vessels bilaterally. Reproductive: Uterus and bilateral adnexa are unremarkable. Other: Negative for free air or free fluid Musculoskeletal: No acute or suspicious osseous abnormality IMPRESSION: 1. Indistinct appearance of the  pancreatic head and uncinate process consistent with acute pancreatitis. New mild pancreatic ductal dilatation. No necrosis or organized fluid collection. 2. Mild extrahepatic biliary dilatation, suggest correlation with LFTs. Follow-up MRCP as indicated. Electronically Signed   By: Donavan Foil M.D.   On: 09/02/2019 01:35   Mr 3d Recon At Scanner  Result Date: 09/02/2019 CLINICAL DATA:  Acute pancreatitis. EXAM: MRI ABDOMEN WITHOUT AND WITH CONTRAST (INCLUDING MRCP) TECHNIQUE: Multiplanar multisequence MR imaging of the abdomen was performed both before and after the administration of intravenous contrast. Heavily T2-weighted images of the biliary and pancreatic ducts were obtained, and three-dimensional MRCP images were rendered by post processing. CONTRAST:  22mL GADAVIST GADOBUTROL 1 MMOL/ML IV SOLN COMPARISON:  CT scan 09/01/2019 FINDINGS: Lower chest: Unremarkable. Hepatobiliary: No focal abnormality within the liver parenchyma. Gallbladder is distended with layering sludge. Mild intrahepatic biliary duct dilatation associated with extrahepatic common duct measuring 10 mm diameter. Pancreas: The main pancreatic duct is dilated up to 3-4 mm diameter with an abrupt cut off in the head of the pancreas (see axial T2 image 28/series 4). Masslike thickening noted in the head of the pancreas. No substantial peripancreatic edema. Spleen:  No splenomegaly. No focal mass lesion. Adrenals/Urinary Tract: No adrenal nodule or mass. Kidneys unremarkable. Stomach/Bowel: Stomach is unremarkable. No gastric wall thickening. No evidence of outlet obstruction. Duodenum is normally positioned as is the ligament of Treitz. No small bowel or colonic dilatation within the visualized abdomen. Vascular/Lymphatic: Motion degraded, but there does appear to be some mild mass-effect on the portal splenic confluence. Portal vein, superior mesenteric vein, and splenic vein are patent. Other:  No intraperitoneal free fluid.  Musculoskeletal: No abnormal marrow enhancement within the visualized bony anatomy. IMPRESSION: 1. Masslike fullness in the head of the pancreas with abrupt cut off of the main pancreatic duct at the level of the pancreatic head. This is associated with biliary dilatation. No substantial peripancreatic edema/inflammation. Imaging features would be quite focal for pancreatitis and ill-defined, poorly demonstrated mass lesion in the head of the pancreas is a real concern. Upper endoscopy would likely prove helpful to further evaluate. 2. Gallbladder distention with layering sludge. No definite gallstones. Electronically Signed   By: Misty Stanley M.D.   On: 09/02/2019 16:25   Mr Abdomen Mrcp Moise Boring Contast  Result Date: 09/02/2019 CLINICAL DATA:  Acute pancreatitis. EXAM: MRI ABDOMEN WITHOUT AND WITH CONTRAST (INCLUDING MRCP) TECHNIQUE: Multiplanar multisequence MR imaging of the abdomen was performed both before and after the administration of intravenous contrast. Heavily T2-weighted images of the biliary and pancreatic ducts were obtained, and three-dimensional MRCP images were rendered by post processing. CONTRAST:  14mL  GADAVIST GADOBUTROL 1 MMOL/ML IV SOLN COMPARISON:  CT scan 09/01/2019 FINDINGS: Lower chest: Unremarkable. Hepatobiliary: No focal abnormality within the liver parenchyma. Gallbladder is distended with layering sludge. Mild intrahepatic biliary duct dilatation associated with extrahepatic common duct measuring 10 mm diameter. Pancreas: The main pancreatic duct is dilated up to 3-4 mm diameter with an abrupt cut off in the head of the pancreas (see axial T2 image 28/series 4). Masslike thickening noted in the head of the pancreas. No substantial peripancreatic edema. Spleen:  No splenomegaly. No focal mass lesion. Adrenals/Urinary Tract: No adrenal nodule or mass. Kidneys unremarkable. Stomach/Bowel: Stomach is unremarkable. No gastric wall thickening. No evidence of outlet obstruction. Duodenum  is normally positioned as is the ligament of Treitz. No small bowel or colonic dilatation within the visualized abdomen. Vascular/Lymphatic: Motion degraded, but there does appear to be some mild mass-effect on the portal splenic confluence. Portal vein, superior mesenteric vein, and splenic vein are patent. Other:  No intraperitoneal free fluid. Musculoskeletal: No abnormal marrow enhancement within the visualized bony anatomy. IMPRESSION: 1. Masslike fullness in the head of the pancreas with abrupt cut off of the main pancreatic duct at the level of the pancreatic head. This is associated with biliary dilatation. No substantial peripancreatic edema/inflammation. Imaging features would be quite focal for pancreatitis and ill-defined, poorly demonstrated mass lesion in the head of the pancreas is a real concern. Upper endoscopy would likely prove helpful to further evaluate. 2. Gallbladder distention with layering sludge. No definite gallstones. Electronically Signed   By: Misty Stanley M.D.   On: 09/02/2019 16:25    Impression: Abnormal appearance of pancreas suspicious for focal pancreatitis versus pancreatic head lesion.  No history of significant alcohol use, no calcified gallstones noted although patient has layering sludge, normal triglycerides, recent introduction of 2 new medications-lisinopril in 08/11/2019 and Prandin in 06/03/2019-both of which can cause pancreatitis.  Liver enzymes within normal limits  It is unusual for pancreatic malignancy to present with pain and without abnormal LFTs  Plan: Start patient on low-fat diet If able to tolerate low-fat diet, recommend discharge in a.m., with plans for outpatient endoscopic ultrasound with Dr. Paulita Fujita. Meanwhile will send ANA and IgG subclass as autoimmune pancreatitis is certainly a consideration. Recommend stopping lisinopril, and using an alternative therapy for Prandin for diabetes. Will send CA-19-9, and keep patient on IV fluids at 100  cc an hour until she is ready for discharge.     LOS: 1 day   Ronnette Juniper, MD  09/03/2019, 2:04 PM

## 2019-09-04 LAB — CBC
HCT: 36 % (ref 36.0–46.0)
Hemoglobin: 11.6 g/dL — ABNORMAL LOW (ref 12.0–15.0)
MCH: 30.8 pg (ref 26.0–34.0)
MCHC: 32.2 g/dL (ref 30.0–36.0)
MCV: 95.5 fL (ref 80.0–100.0)
Platelets: 285 10*3/uL (ref 150–400)
RBC: 3.77 MIL/uL — ABNORMAL LOW (ref 3.87–5.11)
RDW: 12.5 % (ref 11.5–15.5)
WBC: 5.7 10*3/uL (ref 4.0–10.5)
nRBC: 0 % (ref 0.0–0.2)

## 2019-09-04 LAB — COMPREHENSIVE METABOLIC PANEL
ALT: 22 U/L (ref 0–44)
AST: 26 U/L (ref 15–41)
Albumin: 3.2 g/dL — ABNORMAL LOW (ref 3.5–5.0)
Alkaline Phosphatase: 50 U/L (ref 38–126)
Anion gap: 10 (ref 5–15)
BUN: 8 mg/dL (ref 8–23)
CO2: 24 mmol/L (ref 22–32)
Calcium: 9.3 mg/dL (ref 8.9–10.3)
Chloride: 104 mmol/L (ref 98–111)
Creatinine, Ser: 0.68 mg/dL (ref 0.44–1.00)
GFR calc Af Amer: >60 mL/min (ref >60)
GFR calc non Af Amer: >60 mL/min (ref >60)
Glucose, Bld: 234 mg/dL — ABNORMAL HIGH (ref 70–99)
Potassium: 3.8 mmol/L (ref 3.5–5.1)
Sodium: 138 mmol/L (ref 135–145)
Total Bilirubin: 0.5 mg/dL (ref 0.3–1.2)
Total Protein: 6.2 g/dL — ABNORMAL LOW (ref 6.5–8.1)

## 2019-09-04 LAB — GLUCOSE, CAPILLARY
Glucose-Capillary: 176 mg/dL — ABNORMAL HIGH (ref 70–99)
Glucose-Capillary: 215 mg/dL — ABNORMAL HIGH (ref 70–99)
Glucose-Capillary: 238 mg/dL — ABNORMAL HIGH (ref 70–99)
Glucose-Capillary: 291 mg/dL — ABNORMAL HIGH (ref 70–99)

## 2019-09-04 LAB — IGG 4: IgG, Subclass 4: 32 mg/dL (ref 2–96)

## 2019-09-04 LAB — CANCER ANTIGEN 19-9
CA 19-9: 20 U/mL (ref 0–35)
CA 19-9: 20 U/mL (ref 0–35)

## 2019-09-04 LAB — IGG: IgG (Immunoglobin G), Serum: 614 mg/dL (ref 586–1602)

## 2019-09-04 LAB — MAGNESIUM: Magnesium: 1.3 mg/dL — ABNORMAL LOW (ref 1.7–2.4)

## 2019-09-04 MED ORDER — HYDROCODONE-ACETAMINOPHEN 5-325 MG PO TABS: 1.0000 | ORAL_TABLET | Freq: Four times a day (QID) | ORAL | 0 refills | Status: AC | PRN

## 2019-09-04 MED ORDER — MAGNESIUM SULFATE 2 GM/50ML IV SOLN
2.0000 g | Freq: Once | INTRAVENOUS | Status: AC
  Administered 2019-09-04: 2 g via INTRAVENOUS
  Filled 2019-09-04: qty 50

## 2019-09-04 MED ORDER — SENNOSIDES-DOCUSATE SODIUM 8.6-50 MG PO TABS: 2.0000 | ORAL_TABLET | Freq: Every evening | ORAL | 1 refills | Status: DC | PRN

## 2019-09-04 MED ORDER — POLYETHYLENE GLYCOL 3350 17 G PO PACK: 17.0000 g | PACK | Freq: Every day | ORAL | 0 refills | Status: DC | PRN

## 2019-09-04 NOTE — Plan of Care (Signed)
Patient discharged home in stable condition 

## 2019-09-04 NOTE — Discharge Summary (Signed)
Physician Discharge Summary  Jill Higgins [id number] DOB: 1945-12-18 DOA: 09/01/2019  PCP: Carol Ada, MD  Admit date: 09/01/2019 Discharge date: 09/04/2019  Admitted From: Home Disposition: Home  Recommendations for Outpatient Follow-up:  1. Follow up with PCP in 1 week 2. Please obtain BMP/CBC in one week your next doctors visit.  3. Discontinue lisinopril and Prandin 4. Follow-up patient gastroenterology for endoscopic evaluation.  Arrangements to be made by outpatient [facility] gastroenterology.   Discharge Condition: Stable CODE STATUS: Full code  Brief/Interim Summary: 73 year old with history of diabetes mellitus type 2, peripheral vascular disease status post bypass, essential hypertension presented with complaints of epigastric abdominal pain radiating to her back which has been ongoing for several days.  CT of the abdomen pelvis showed acute pancreatitis with ductal dilation extrahepatic ductal dilation.  MRCP showed acute pancreatitis along with poorly defined mass in the pancreatic head.  GI was consulted and outpatient arrangements for endoscopic evaluation was made by their service.  Will discontinue lisinopril and Prandin for now and have her follow-up outpatient with primary care physician for further adjustments for her diabetes.   Discharge Diagnoses:  Principal Problem:   Acute pancreatitis Active Problems:   Hypertension   PAD (peripheral artery disease) (HCC)   S/P femoropopliteal bypass surgery   Type 2 diabetes mellitus with vascular disease (HCC)  Acute pancreatitis with ductal dilation, significantly better Mass in the head of pancreas -No obvious evidence of choledocholithiasis. - Triglycerides-102 -MRCP reviewed -GI consulted, endoscopic evaluation will be arranged outpatient -CA 19-9 normal - Advised to slowly increase diet at home and continue hydration  Diabetes mellitus type 2 Peripheral neuropathy secondary to diabetes mellitus type  2 -Continue gabapentin -Discontinue Prandin.  Continue Metformin.  Follow-up outpatient with PCP for further adjustment  Essential hypertension -Discontinue lisinopril.  Continue Norvasc metoprolol, PRN IV hydralazine  History of peripheral vascular disease status post bypass   Consultations:  GI  Subjective: Feels okay no complaints.  Discharge Exam: Vitals:   09/04/19 0500 09/04/19 0929  BP: 132/76 (!) 141/66  Pulse: (!) 55 (!) 55  Resp: 17   Temp: 98.5 F (36.9 C)   SpO2: 95%    Vitals:   09/03/19 1404 09/03/19 2106 09/04/19 0500 09/04/19 0929  BP: (!) 152/92 (!) 143/95 132/76 (!) 141/66  Pulse: (!) 53 (!) 58 (!) 55 (!) 55  Resp: 17 18 17    Temp: 98.2 F (36.8 C) 98.6 F (37 C) 98.5 F (36.9 C)   TempSrc: Oral Oral Oral   SpO2: 97% 99% 95%   Weight:   66.7 kg   Height:        General: Pt is alert, awake, not in acute distress Cardiovascular: RRR, S1/S2 +, no rubs, no gallops Respiratory: CTA bilaterally, no wheezing, no rhonchi Abdominal: Soft, NT, ND, bowel sounds + Extremities: no edema, no cyanosis  Discharge Instructions   Allergies as of 09/04/2019      Reactions   Adhesive [tape] Other (See Comments)   Pt states that tape and electrodes leave red "scars" on her skin and her skin is very sensitive.      Medication List    TAKE these medications   Accu-Chek Aviva Plus test strip Generic drug: glucose blood 1 each by Other route 3 (three) times daily.   Accu-Chek Softclix Lancets lancets 1 each by Other route 3 (three) times daily. Use to monitor glucose levels TID; E11.9   amLODipine 5 MG tablet Commonly known as: NORVASC Take 5 mg by mouth  daily.   aspirin EC 81 MG tablet Take 81 mg by mouth at bedtime.   bismuth subsalicylate [id number] [id number] suspension Commonly known as: PEPTO BISMOL Take 30 mLs by mouth every 6 (six) hours as needed for indigestion or diarrhea or loose stools. Reported on 11/04/2015   famotidine 20 MG  tablet Commonly known as: PEPCID Take 1 tablet (20 mg total) by mouth 2 (two) times daily.   gabapentin 300 MG capsule Commonly known as: NEURONTIN TAKE 1 CAPSULE BY MOUTH THREE TIMES A DAY What changed:   how much to take  how to take this  when to take this  additional instructions   HYDROcodone-acetaminophen 5-325 MG tablet Commonly known as: NORCO/VICODIN Take 1 tablet by mouth every 6 (six) hours as needed for up to 5 days for moderate pain. What changed:   how much to take  how to take this  when to take this  reasons to take this  additional instructions   lisinopril 5 MG tablet Commonly known as: ZESTRIL Take 5 mg by mouth daily.   lovastatin 20 MG tablet Commonly known as: MEVACOR Take 20 mg by mouth every evening.   metFORMIN 1000 MG tablet Commonly known as: GLUCOPHAGE Take 1 tablet (1,000 mg total) by mouth 2 (two) times daily with a meal.   methocarbamol 500 MG tablet Commonly known as: ROBAXIN Take 1 tablet (500 mg total) by mouth 2 (two) times daily.   metoprolol succinate 100 MG 24 hr tablet Commonly known as: TOPROL-XL Take 100 mg by mouth daily.   OVER THE COUNTER MEDICATION Apply 1 patch topically daily as needed (pain). Apply to right shoulder   pantoprazole 40 MG tablet Commonly known as: PROTONIX Take 40 mg by mouth daily.   polyethylene glycol 17 g packet Commonly known as: MIRALAX / GLYCOLAX Take 17 g by mouth daily as needed for moderate constipation or severe constipation.   ProAir HFA 108 (90 Base) MCG/ACT inhaler Generic drug: albuterol Inhale 2 puffs into the lungs every 4 (four) hours as needed for wheezing or shortness of breath. Reported on 11/04/2015   repaglinide 1 MG tablet Commonly known as: PRANDIN Take 1 tablet (1 mg total) by mouth 2 (two) times daily before a meal.   Rocklatan 0.02-0.005 % Soln Generic drug: Netarsudil-Latanoprost Apply to eye.   senna-docusate 8.6-50 MG tablet Commonly known as:  Senokot-S Take 2 tablets by mouth at bedtime as needed for mild constipation or moderate constipation.   sucralfate 1 GM/10ML suspension Commonly known as: Carafate Take 10 mLs (1 g total) by mouth 4 (four) times daily -  with meals and at bedtime.   temazepam 30 MG capsule Commonly known as: RESTORIL Take 30 mg by mouth at bedtime as needed for sleep.      Follow-up Information    Arta Silence, MD. Go to.   Specialty: Gastroenterology Why: [facility] to arrange for Appnt. Contact information: [address] [place] [address] [phone]          Allergies  Allergen Reactions  . Adhesive [Tape] Other (See Comments)    Pt states that tape and electrodes leave red "scars" on her skin and her skin is very sensitive.    You were cared for by a hospitalist during your hospital stay. If you have any questions about your discharge medications or the care you received while you were in the hospital after you are discharged, you can call the unit and asked to speak with the  hospitalist on call if the hospitalist that took care of you is not available. Once you are discharged, your primary care physician will handle any further medical issues. Please note that no refills for any discharge medications will be authorized once you are discharged, as it is imperative that you return to your primary care physician (or establish a relationship with a primary care physician if you do not have one) for your aftercare needs so that they can reassess your need for medications and monitor your lab values.   Procedures/Studies: Dg Chest 2 View  Result Date: 09/01/2019 CLINICAL DATA:  Chest pain. EXAM: CHEST - 2 VIEW COMPARISON:  06/05/2018 FINDINGS: The heart size and mediastinal contours are within normal limits. Both lungs are clear. The visualized skeletal structures are unremarkable. IMPRESSION: No active cardiopulmonary disease. Electronically Signed   By: Kerby Moors  M.D.   On: 09/01/2019 18:16   Ct Abdomen Pelvis W Contrast  Result Date: 09/02/2019 CLINICAL DATA:  Chest pain abdominal pain EXAM: CT ABDOMEN AND PELVIS WITH CONTRAST TECHNIQUE: Multidetector CT imaging of the abdomen and pelvis was performed using the standard protocol following bolus administration of intravenous contrast. CONTRAST:  115mL OMNIPAQUE IOHEXOL 300 MG/ML  SOLN COMPARISON:  CT 08/22/2019 FINDINGS: Lower chest: Lung bases demonstrate no acute consolidation or effusion. The heart size is normal. Coronary vascular calcification. Hepatobiliary: No calcified gallstone. No focal hepatic abnormality. Enlarged extrahepatic common bile duct up to 10 mm with tapering at the head of the pancreas. Pancreas: Mild ductal dilatation. Indistinct appearance of the pancreatic head and uncinate process with mild soft tissue stranding suspicious for an acute pancreatitis. Spleen: Normal in size without focal abnormality. Adrenals/Urinary Tract: Adrenal glands are normal. Kidneys show no hydronephrosis. The bladder is unremarkable Stomach/Bowel: Stomach is nonenlarged. No dilated small bowel. No bowel wall thickening. Appendix contains small stone but is otherwise normal. Vascular/Lymphatic: Moderate aortic atherosclerosis without aneurysm. No significant adenopathy soft tissue thickening and presumed postsurgical changes at the common femoral vessels bilaterally. Reproductive: Uterus and bilateral adnexa are unremarkable. Other: Negative for free air or free fluid Musculoskeletal: No acute or suspicious osseous abnormality IMPRESSION: 1. Indistinct appearance of the pancreatic head and uncinate process consistent with acute pancreatitis. New mild pancreatic ductal dilatation. No necrosis or organized fluid collection. 2. Mild extrahepatic biliary dilatation, suggest correlation with LFTs. Follow-up MRCP as indicated. Electronically Signed   By: Donavan Foil M.D.   On: 09/02/2019 01:35   Ct Abdomen Pelvis W  Contrast  Result Date: 08/22/2019 CLINICAL DATA:  Acute generalized abdominal pain for 4 days EXAM: CT ABDOMEN AND PELVIS WITH CONTRAST TECHNIQUE: Multidetector CT imaging of the abdomen and pelvis was performed using the standard protocol following bolus administration of intravenous contrast. CONTRAST:  140mL OMNIPAQUE IOHEXOL 300 MG/ML  SOLN COMPARISON:  CT Apr 09, 2015, abdominal radiograph 06/05/2018 FINDINGS: Lower chest: Lung bases are clear. Normal heart size. No pericardial effusion. Hepatobiliary: No focal liver abnormality is seen. No gallstones, gallbladder wall thickening, or biliary dilatation. Pancreas: Question a small amount of peripancreatic stranding near the head neck junction with trace adjacent fluid in the lesser sac (sagittal 6/111) and several reactive appearing lymph nodes in the retroperitoneum. Minimal if any edematous inflammation of the pancreatic parenchyma is seen however. No hypoattenuation. No biliary ductal dilatation. Spleen: Normal in size without focal abnormality. Adrenals/Urinary Tract: Adrenal glands are unremarkable. Kidneys are normal, without renal calculi, focal lesion, or hydronephrosis. Urinary bladder is largely decompressed at the time of exam and therefore poorly  evaluated by CT imaging. Stomach/Bowel: Distal esophagus is unremarkable. There is mild circumferential thickening of the gastric antrum which could be related to peristaltic contraction versus inflammation given adjacent fluid in the lesser sac. No extraluminal gas. Duodenal sweep takes a normal course. No small bowel dilatation or wall thickening. A normal appendix is visualized. Proximal colon is unremarkable. There is segmental thickening of the distal colon from the level of the descending colon to the rectosigmoid and stretch colon demonstrating multiple colonic diverticula. No significant pericolonic inflammation is present. Vascular/Lymphatic: Atherosclerotic plaque within the normal caliber aorta.  Reactive upper abdominal and retroperitoneal lymph nodes. Reproductive: Normal appearance of the uterus and adnexal structures. Other: Postsurgical changes in the right groin possibly related to prior vascular access or inguinal repair. No bowel containing hernia. Small volume fluid in the lesser sac. No other free abdominopelvic fluid or gas. Musculoskeletal: No acute osseous abnormality or suspicious osseous lesion. Multilevel degenerative changes are present in the imaged portions of the spine. Additional degenerative changes in the SI joints and hips. IMPRESSION: 1. Mild hazy hazy stranding in the space between the pancreatic head neck junction and gastric antrum with small volume fluid in the lesser. Mild thickening of the gastric antrum is noted as well. Several adjacent reactive lymph nodes are also visualized. Findings could reflect a mild pancreatitis given slight elevation the patient's lipase versus a gastritis with reactive inflammation of pancreas. Correlate with patient's symptoms. 2. Mild segmental thickening of the colon without acute peripancreatic inflammation seen from the the distal descending to the rectosigmoid in a region of multiple colonic diverticula may reflect sequela of prior inflammation. Consider outpatient evaluation colonoscopy to exclude underlying mass. 3. Aortic Atherosclerosis (ICD10-I70.0). These results were called by telephone at the time of interpretation on 08/22/2019 at 11:11 pm to provider [facility] , who verbally acknowledged these results. Electronically Signed   By: Lovena Le M.D.   On: 08/22/2019 23:12   Mr 3d Recon At Scanner  Result Date: 09/02/2019 CLINICAL DATA:  Acute pancreatitis. EXAM: MRI ABDOMEN WITHOUT AND WITH CONTRAST (INCLUDING MRCP) TECHNIQUE: Multiplanar multisequence MR imaging of the abdomen was performed both before and after the administration of intravenous contrast. Heavily T2-weighted images of the biliary and pancreatic ducts were  obtained, and three-dimensional MRCP images were rendered by post processing. CONTRAST:  6mL GADAVIST GADOBUTROL 1 MMOL/ML IV SOLN COMPARISON:  CT scan 09/01/2019 FINDINGS: Lower chest: Unremarkable. Hepatobiliary: No focal abnormality within the liver parenchyma. Gallbladder is distended with layering sludge. Mild intrahepatic biliary duct dilatation associated with extrahepatic common duct measuring 10 mm diameter. Pancreas: The main pancreatic duct is dilated up to 3-4 mm diameter with an abrupt cut off in the head of the pancreas (see axial T2 image 28/series 4). Masslike thickening noted in the head of the pancreas. No substantial peripancreatic edema. Spleen:  No splenomegaly. No focal mass lesion. Adrenals/Urinary Tract: No adrenal nodule or mass. Kidneys unremarkable. Stomach/Bowel: Stomach is unremarkable. No gastric wall thickening. No evidence of outlet obstruction. Duodenum is normally positioned as is the ligament of Treitz. No small bowel or colonic dilatation within the visualized abdomen. Vascular/Lymphatic: Motion degraded, but there does appear to be some mild mass-effect on the portal splenic confluence. Portal vein, superior mesenteric vein, and splenic vein are patent. Other:  No intraperitoneal free fluid. Musculoskeletal: No abnormal marrow enhancement within the visualized bony anatomy. IMPRESSION: 1. Masslike fullness in the head of the pancreas with abrupt cut off of the main pancreatic duct at the level of  the pancreatic head. This is associated with biliary dilatation. No substantial peripancreatic edema/inflammation. Imaging features would be quite focal for pancreatitis and ill-defined, poorly demonstrated mass lesion in the head of the pancreas is a real concern. Upper endoscopy would likely prove helpful to further evaluate. 2. Gallbladder distention with layering sludge. No definite gallstones. Electronically Signed   By: Misty Stanley M.D.   On: 09/02/2019 16:25   Mr Abdomen Mrcp  Moise Boring Contast  Result Date: 09/02/2019 CLINICAL DATA:  Acute pancreatitis. EXAM: MRI ABDOMEN WITHOUT AND WITH CONTRAST (INCLUDING MRCP) TECHNIQUE: Multiplanar multisequence MR imaging of the abdomen was performed both before and after the administration of intravenous contrast. Heavily T2-weighted images of the biliary and pancreatic ducts were obtained, and three-dimensional MRCP images were rendered by post processing. CONTRAST:  51mL GADAVIST GADOBUTROL 1 MMOL/ML IV SOLN COMPARISON:  CT scan 09/01/2019 FINDINGS: Lower chest: Unremarkable. Hepatobiliary: No focal abnormality within the liver parenchyma. Gallbladder is distended with layering sludge. Mild intrahepatic biliary duct dilatation associated with extrahepatic common duct measuring 10 mm diameter. Pancreas: The main pancreatic duct is dilated up to 3-4 mm diameter with an abrupt cut off in the head of the pancreas (see axial T2 image 28/series 4). Masslike thickening noted in the head of the pancreas. No substantial peripancreatic edema. Spleen:  No splenomegaly. No focal mass lesion. Adrenals/Urinary Tract: No adrenal nodule or mass. Kidneys unremarkable. Stomach/Bowel: Stomach is unremarkable. No gastric wall thickening. No evidence of outlet obstruction. Duodenum is normally positioned as is the ligament of Treitz. No small bowel or colonic dilatation within the visualized abdomen. Vascular/Lymphatic: Motion degraded, but there does appear to be some mild mass-effect on the portal splenic confluence. Portal vein, superior mesenteric vein, and splenic vein are patent. Other:  No intraperitoneal free fluid. Musculoskeletal: No abnormal marrow enhancement within the visualized bony anatomy. IMPRESSION: 1. Masslike fullness in the head of the pancreas with abrupt cut off of the main pancreatic duct at the level of the pancreatic head. This is associated with biliary dilatation. No substantial peripancreatic edema/inflammation. Imaging features would be  quite focal for pancreatitis and ill-defined, poorly demonstrated mass lesion in the head of the pancreas is a real concern. Upper endoscopy would likely prove helpful to further evaluate. 2. Gallbladder distention with layering sludge. No definite gallstones. Electronically Signed   By: Misty Stanley M.D.   On: 09/02/2019 16:25      The results of significant diagnostics from this hospitalization (including imaging, microbiology, ancillary and laboratory) are listed below for reference.     Microbiology: Recent Results (from the past 240 hour(s))  SARS CORONAVIRUS 2 (TAT 6-24 HRS) Nasopharyngeal Nasopharyngeal Swab     Status: None   Collection Time: 09/02/19  2:41 AM   Specimen: Nasopharyngeal Swab  Result Value Ref Range Status   SARS Coronavirus 2 NEGATIVE NEGATIVE Final    Comment: (NOTE) SARS-CoV-2 target nucleic acids are NOT DETECTED. The SARS-CoV-2 RNA is generally detectable in upper and lower respiratory specimens during the acute phase of infection. Negative results do not preclude SARS-CoV-2 infection, do not rule out co-infections with other pathogens, and should not be used as the sole basis for treatment or other patient management decisions. Negative results must be combined with clinical observations, patient history, and epidemiological information. The expected result is Negative. Fact Sheet for Patients: SugarRoll.be Fact Sheet for Healthcare Providers: https://www.woods-mathews.com/ This test is not yet approved or cleared by the [place] FDA and  has been authorized for detection and/or diagnosis  of SARS-CoV-2 by FDA under an Emergency Use Authorization (EUA). This EUA will remain  in effect (meaning this test can be used) for the duration of the COVID-19 declaration under Section 56 4(b)(1) of the Act, 21 U.S.C. section 360bbb-3(b)(1), unless the authorization is terminated or revoked sooner. Performed at [facility], [place] [address] [place], [place] [address]      Labs: BNP (last 3 results) No results for input(s): BNP in the last 8760 hours. Basic Metabolic Panel: Recent Labs  Lab 09/01/19 1803 09/02/19 0448 09/04/19 0249  NA 136 139 138  K 3.8 3.2* 3.8  CL 100 108 104  CO2 23 23 24   GLUCOSE 160* 128* 234*  BUN 10 8 8   CREATININE 0.65 0.60 0.68  CALCIUM 9.8 7.8* 9.3  MG  --   --  1.3*   Liver Function Tests: Recent Labs  Lab 09/01/19 1803 09/02/19 0448 09/04/19 0249  AST 17 15 26   ALT 18 15 22   ALKPHOS 63 48 50  BILITOT 0.4 0.2* 0.5  PROT 7.8 5.9* 6.2*  ALBUMIN 4.0 3.0* 3.2*   Recent Labs  Lab 09/01/19 1803  LIPASE 63*   No results for input(s): AMMONIA in the last 168 hours. CBC: Recent Labs  Lab 09/01/19 1803 09/02/19 0448 09/04/19 0249  WBC 7.9 6.4 5.7  NEUTROABS  --  3.2  --   HGB 13.9 13.0 11.6*  HCT 42.4 40.3 36.0  MCV 93.4 95.3 95.5  PLT 379 320 285   Cardiac Enzymes: No results for input(s): CKTOTAL, CKMB, CKMBINDEX, TROPONINI in the last 168 hours. BNP: Invalid input(s): POCBNP CBG: Recent Labs  Lab 09/03/19 2057 09/04/19 0008 09/04/19 0448 09/04/19 0727 09/04/19 1155  GLUCAP 189* 176* 238* 215* 291*   D-Dimer No results for input(s): DDIMER in the last 72 hours. Hgb A1c No results for input(s): HGBA1C in the last 72 hours. Lipid Profile Recent Labs    09/02/19 0448  TRIG 102   Thyroid function studies No results for input(s): TSH, T4TOTAL, T3FREE, THYROIDAB in the last 72 hours.  Invalid input(s): FREET3 Anemia work up No results for input(s): VITAMINB12, FOLATE, FERRITIN, TIBC, IRON, RETICCTPCT in the last 72 hours. Urinalysis    Component Value Date/Time   COLORURINE AMBER (A) 08/22/2019 2006   APPEARANCEUR CLOUDY (A) 08/22/2019 2006   LABSPEC 1.029 08/22/2019 2006   PHURINE 5.0 08/22/2019 2006   GLUCOSEU NEGATIVE 08/22/2019 2006   HGBUR NEGATIVE 08/22/2019 2006   BILIRUBINUR SMALL (A) 08/22/2019 2006    [place] NEGATIVE 08/22/2019 2006   PROTEINUR 30 (A) 08/22/2019 2006   UROBILINOGEN 0.2 09/29/2013 1359   NITRITE NEGATIVE 08/22/2019 2006   LEUKOCYTESUR NEGATIVE 08/22/2019 2006   Sepsis Labs Invalid input(s): PROCALCITONIN,  WBC,  LACTICIDVEN Microbiology Recent Results (from the past 240 hour(s))  SARS CORONAVIRUS 2 (TAT 6-24 HRS) Nasopharyngeal Nasopharyngeal Swab     Status: None   Collection Time: 09/02/19  2:41 AM   Specimen: Nasopharyngeal Swab  Result Value Ref Range Status   SARS Coronavirus 2 NEGATIVE NEGATIVE Final    Comment: (NOTE) SARS-CoV-2 target nucleic acids are NOT DETECTED. The SARS-CoV-2 RNA is generally detectable in upper and lower respiratory specimens during the acute phase of infection. Negative results do not preclude SARS-CoV-2 infection, do not rule out co-infections with other pathogens, and should not be used as the sole basis for treatment or other patient management decisions. Negative results must be combined with clinical observations, patient history, and epidemiological information. The expected  result is Negative. Fact Sheet for Patients: SugarRoll.be Fact Sheet for Healthcare Providers: https://www.woods-mathews.com/ This test is not yet approved or cleared by the [place] FDA and  has been authorized for detection and/or diagnosis of SARS-CoV-2 by FDA under an Emergency Use Authorization (EUA). This EUA will remain  in effect (meaning this test can be used) for the duration of the COVID-19 declaration under Section 56 4(b)(1) of the Act, 21 U.S.C. section 360bbb-3(b)(1), unless the authorization is terminated or revoked sooner. Performed at [facility], [place] [address] [place], [place] [address]      Time coordinating discharge:  I have spent 35 minutes face to face with the patient and on the ward discussing the patients care, assessment, plan and disposition with other care  givers. >50% of the time was devoted counseling the patient about the risks and benefits of treatment/Discharge disposition and coordinating care.   SIGNED:   Damita Lack, MD  [facility] 09/04/2019, 12:04 PM   If 7PM-7AM, please contact night-coverage

## 2019-09-04 NOTE — Progress Notes (Signed)
Subjective: Patient was able to eat rice, chicken and gravy yesterday evening without worsening abdominal pain.  She feels some epigastric pressure and last bowel movement was yesterday.  She denies nausea or vomiting.  Objective: Vital signs in last 24 hours: Temp:  [98.2 F (36.8 C)-98.6 F (37 C)] 98.5 F (36.9 C) (10/15 0500) Pulse Rate:  [53-58] 55 (10/15 0500) Resp:  [17-18] 17 (10/15 0500) BP: (132-152)/(76-95) 132/76 (10/15 0500) SpO2:  [95 %-99 %] 95 % (10/15 0500) Weight:  [66.7 kg] 66.7 kg (10/15 0500) Weight change: 4.6 kg Last BM Date: 09/03/19  PE: Appears comfortable, no pallor, no icterus GENERAL: Able to speak in full sentences, not in distress ABDOMEN: Soft, nondistended, nontender, normoactive bowel sounds EXTREMITIES: No deformity  Lab Results: Results for orders placed or performed during the hospital encounter of 09/01/19 (from the past 48 hour(s))  CBG monitoring, ED     Status: Abnormal   Collection Time: 09/02/19 12:13 PM  Result Value Ref Range   Glucose-Capillary 117 (H) 70 - 99 mg/dL  CBG monitoring, ED     Status: Abnormal   Collection Time: 09/02/19  4:12 PM  Result Value Ref Range   Glucose-Capillary 130 (H) 70 - 99 mg/dL  CBG monitoring, ED     Status: Abnormal   Collection Time: 09/02/19  4:42 PM  Result Value Ref Range   Glucose-Capillary 130 (H) 70 - 99 mg/dL  Glucose, capillary     Status: Abnormal   Collection Time: 09/02/19  7:57 PM  Result Value Ref Range   Glucose-Capillary 195 (H) 70 - 99 mg/dL  Glucose, capillary     Status: Abnormal   Collection Time: 09/02/19 11:37 PM  Result Value Ref Range   Glucose-Capillary 140 (H) 70 - 99 mg/dL  Glucose, capillary     Status: Abnormal   Collection Time: 09/03/19  3:26 AM  Result Value Ref Range   Glucose-Capillary 133 (H) 70 - 99 mg/dL  Glucose, capillary     Status: Abnormal   Collection Time: 09/03/19  7:38 AM  Result Value Ref Range   Glucose-Capillary 143 (H) 70 - 99 mg/dL   Cancer antigen 19-9     Status: None   Collection Time: 09/03/19 11:13 AM  Result Value Ref Range   CA 19-9 20 0 - 35 U/mL    Comment: (NOTE) Roche [employer]ostics Electrochemiluminescence Immunoassay (ECLIA) Values obtained with different assay methods or kits cannot be used interchangeably.  Results cannot be interpreted as absolute evidence of the presence or absence of malignant disease. Performed At: [facility] [place], [place] [id number] Rush Farmer MD [id number]   Glucose, capillary     Status: Abnormal   Collection Time: 09/03/19 11:34 AM  Result Value Ref Range   Glucose-Capillary 256 (H) 70 - 99 mg/dL  IgG for subclass interpretation     Status: None   Collection Time: 09/03/19  1:32 PM  Result Value Ref Range   IgG (Immunoglobin G), Serum 614 586 - 1,602 mg/dL    Comment: (NOTE) Performed At: [facility] [place], [place] [id number] Rush Farmer MD [id number]   IgG 4     Status: None   Collection Time: 09/03/19  1:32 PM  Result Value Ref Range   IgG, Subclass 4 32 2 - 96 mg/dL    Comment: (NOTE) Performed At: [facility] [place], [place] [id number] Rush Farmer MD [id number]   Cancer antigen 19-9     Status:  None   Collection Time: 09/03/19  1:32 PM  Result Value Ref Range   CA 19-9 20 0 - 35 U/mL    Comment: (NOTE) Roche [employer]ostics Electrochemiluminescence Immunoassay (ECLIA) Values obtained with different assay methods or kits cannot be used interchangeably.  Results cannot be interpreted as absolute evidence of the presence or absence of malignant disease. Performed At: [facility] [place], [place] [id number] Rush Farmer MD [id number]   Glucose, capillary     Status: Abnormal   Collection Time: 09/03/19  5:10 PM  Result Value Ref Range   Glucose-Capillary 257 (H) 70 - 99 mg/dL  Glucose, capillary     Status: Abnormal   Collection Time:  09/03/19  8:57 PM  Result Value Ref Range   Glucose-Capillary 189 (H) 70 - 99 mg/dL  Glucose, capillary     Status: Abnormal   Collection Time: 09/04/19 12:08 AM  Result Value Ref Range   Glucose-Capillary 176 (H) 70 - 99 mg/dL  Comprehensive metabolic panel     Status: Abnormal   Collection Time: 09/04/19  2:49 AM  Result Value Ref Range   Sodium 138 135 - 145 mmol/L   Potassium 3.8 3.5 - 5.1 mmol/L   Chloride 104 98 - 111 mmol/L   CO2 24 22 - 32 mmol/L   Glucose, Bld 234 (H) 70 - 99 mg/dL   BUN 8 8 - 23 mg/dL   Creatinine, Ser 0.68 0.44 - 1.00 mg/dL   Calcium 9.3 8.9 - 10.3 mg/dL   Total Protein 6.2 (L) 6.5 - 8.1 g/dL   Albumin 3.2 (L) 3.5 - 5.0 g/dL   AST 26 15 - 41 U/L   ALT 22 0 - 44 U/L   Alkaline Phosphatase 50 38 - 126 U/L   Total Bilirubin 0.5 0.3 - 1.2 mg/dL   GFR calc non Af Amer >60 >60 mL/min   GFR calc Af Amer >60 >60 mL/min   Anion gap 10 5 - 15    Comment: Performed at [facility], [place] [address] [place], [place] [address]  CBC     Status: Abnormal   Collection Time: 09/04/19  2:49 AM  Result Value Ref Range   WBC 5.7 4.0 - 10.5 K/uL   RBC 3.77 (L) 3.87 - 5.11 MIL/uL   Hemoglobin 11.6 (L) 12.0 - 15.0 g/dL   HCT 36.0 36.0 - 46.0 %   MCV 95.5 80.0 - 100.0 fL   MCH 30.8 26.0 - 34.0 pg   MCHC 32.2 30.0 - 36.0 g/dL   RDW 12.5 11.5 - 15.5 %   Platelets 285 150 - 400 K/uL   nRBC 0.0 0.0 - 0.2 %    Comment: Performed at [facility], [place] [address], [place], [place] [address]  Magnesium     Status: Abnormal   Collection Time: 09/04/19  2:49 AM  Result Value Ref Range   Magnesium 1.3 (L) 1.7 - 2.4 mg/dL    Comment: Performed at [facility], [place] [address] [place], [place] [address]  Glucose, capillary     Status: Abnormal   Collection Time: 09/04/19  4:48 AM  Result Value Ref Range   Glucose-Capillary 238 (H) 70 - 99 mg/dL  Glucose, capillary     Status: Abnormal   Collection Time: 09/04/19  7:27  AM  Result Value Ref Range   Glucose-Capillary 215 (H) 70 - 99 mg/dL    Studies/Results: Mr 3d Recon At Scanner  Result Date: 09/02/2019  CLINICAL DATA:  Acute pancreatitis. EXAM: MRI ABDOMEN WITHOUT AND WITH CONTRAST (INCLUDING MRCP) TECHNIQUE: Multiplanar multisequence MR imaging of the abdomen was performed both before and after the administration of intravenous contrast. Heavily T2-weighted images of the biliary and pancreatic ducts were obtained, and three-dimensional MRCP images were rendered by post processing. CONTRAST:  41mL GADAVIST GADOBUTROL 1 MMOL/ML IV SOLN COMPARISON:  CT scan 09/01/2019 FINDINGS: Lower chest: Unremarkable. Hepatobiliary: No focal abnormality within the liver parenchyma. Gallbladder is distended with layering sludge. Mild intrahepatic biliary duct dilatation associated with extrahepatic common duct measuring 10 mm diameter. Pancreas: The main pancreatic duct is dilated up to 3-4 mm diameter with an abrupt cut off in the head of the pancreas (see axial T2 image 28/series 4). Masslike thickening noted in the head of the pancreas. No substantial peripancreatic edema. Spleen:  No splenomegaly. No focal mass lesion. Adrenals/Urinary Tract: No adrenal nodule or mass. Kidneys unremarkable. Stomach/Bowel: Stomach is unremarkable. No gastric wall thickening. No evidence of outlet obstruction. Duodenum is normally positioned as is the ligament of Treitz. No small bowel or colonic dilatation within the visualized abdomen. Vascular/Lymphatic: Motion degraded, but there does appear to be some mild mass-effect on the portal splenic confluence. Portal vein, superior mesenteric vein, and splenic vein are patent. Other:  No intraperitoneal free fluid. Musculoskeletal: No abnormal marrow enhancement within the visualized bony anatomy. IMPRESSION: 1. Masslike fullness in the head of the pancreas with abrupt cut off of the main pancreatic duct at the level of the pancreatic head. This is  associated with biliary dilatation. No substantial peripancreatic edema/inflammation. Imaging features would be quite focal for pancreatitis and ill-defined, poorly demonstrated mass lesion in the head of the pancreas is a real concern. Upper endoscopy would likely prove helpful to further evaluate. 2. Gallbladder distention with layering sludge. No definite gallstones. Electronically Signed   By: Misty Stanley M.D.   On: 09/02/2019 16:25   Mr Abdomen Mrcp Moise Boring Contast  Result Date: 09/02/2019 CLINICAL DATA:  Acute pancreatitis. EXAM: MRI ABDOMEN WITHOUT AND WITH CONTRAST (INCLUDING MRCP) TECHNIQUE: Multiplanar multisequence MR imaging of the abdomen was performed both before and after the administration of intravenous contrast. Heavily T2-weighted images of the biliary and pancreatic ducts were obtained, and three-dimensional MRCP images were rendered by post processing. CONTRAST:  9mL GADAVIST GADOBUTROL 1 MMOL/ML IV SOLN COMPARISON:  CT scan 09/01/2019 FINDINGS: Lower chest: Unremarkable. Hepatobiliary: No focal abnormality within the liver parenchyma. Gallbladder is distended with layering sludge. Mild intrahepatic biliary duct dilatation associated with extrahepatic common duct measuring 10 mm diameter. Pancreas: The main pancreatic duct is dilated up to 3-4 mm diameter with an abrupt cut off in the head of the pancreas (see axial T2 image 28/series 4). Masslike thickening noted in the head of the pancreas. No substantial peripancreatic edema. Spleen:  No splenomegaly. No focal mass lesion. Adrenals/Urinary Tract: No adrenal nodule or mass. Kidneys unremarkable. Stomach/Bowel: Stomach is unremarkable. No gastric wall thickening. No evidence of outlet obstruction. Duodenum is normally positioned as is the ligament of Treitz. No small bowel or colonic dilatation within the visualized abdomen. Vascular/Lymphatic: Motion degraded, but there does appear to be some mild mass-effect on the portal splenic  confluence. Portal vein, superior mesenteric vein, and splenic vein are patent. Other:  No intraperitoneal free fluid. Musculoskeletal: No abnormal marrow enhancement within the visualized bony anatomy. IMPRESSION: 1. Masslike fullness in the head of the pancreas with abrupt cut off of the main pancreatic duct at the level of the pancreatic head.  This is associated with biliary dilatation. No substantial peripancreatic edema/inflammation. Imaging features would be quite focal for pancreatitis and ill-defined, poorly demonstrated mass lesion in the head of the pancreas is a real concern. Upper endoscopy would likely prove helpful to further evaluate. 2. Gallbladder distention with layering sludge. No definite gallstones. Electronically Signed   By: Misty Stanley M.D.   On: 09/02/2019 16:25    Medications: I have reviewed the patient's current medications.  Assessment: Pancreatitis with pancreatic head fullness suspicious for mass Normal LFTs Recently started on lisinopril (07/2019) and repaglinide (was started in 05/2019) both of which have side effect of pancreatitis Normal IgG subclass 4 and CA 19-9  Plan: Tolerating low-fat diet Okay to DC home. Plan endoscopic ultrasound with Dr. Paulita Fujita as an outpatient Would not restart patient on lisinopril or repaglinide-recommend alternative therapy as an outpatient.  Ronnette Juniper, MD 09/04/2019, 9:21 AM

## 2019-09-05 ENCOUNTER — Telehealth: Payer: Self-pay

## 2019-09-05 LAB — ANTINUCLEAR ANTIBODIES, IFA: ANA Ab, IFA: NEGATIVE

## 2019-09-05 NOTE — Telephone Encounter (Signed)
Called pt and advised of Dr. Cordelia Pen recommendations. Verbalized acceptance and understanding. Schedule for hosp f/u 09/11/19

## 2019-09-05 NOTE — Telephone Encounter (Signed)
Patient called in stating she was released from hospital with acute pancreatis. She is not taking her "REPAGLINIDE" or "LISINOPRIL" because there reason for her flare up   Please advise

## 2019-09-05 NOTE — Telephone Encounter (Signed)
Please review 09/01/19 ED visit and advise

## 2019-09-05 NOTE — Telephone Encounter (Signed)
Please stay off repaglinide for now Needs f/u ov next week Lisinopril is a question for primary care provider

## 2019-09-09 ENCOUNTER — Other Ambulatory Visit: Payer: Self-pay

## 2019-09-10 ENCOUNTER — Other Ambulatory Visit: Payer: Self-pay | Admitting: Gastroenterology

## 2019-09-11 ENCOUNTER — Encounter (HOSPITAL_COMMUNITY): Payer: Self-pay | Admitting: Emergency Medicine

## 2019-09-11 ENCOUNTER — Other Ambulatory Visit: Payer: Self-pay

## 2019-09-11 ENCOUNTER — Other Ambulatory Visit (HOSPITAL_COMMUNITY)
Admission: RE | Admit: 2019-09-11 | Discharge: 2019-09-11 | Disposition: A | Discharge status: Home / Self Care | Payer: Medicare Other | Source: Ambulatory Visit | Attending: Gastroenterology | Admitting: Gastroenterology

## 2019-09-11 ENCOUNTER — Encounter: Payer: Self-pay | Admitting: Endocrinology

## 2019-09-11 ENCOUNTER — Ambulatory Visit (INDEPENDENT_AMBULATORY_CARE_PROVIDER_SITE_OTHER): Payer: Medicare Other | Admitting: Endocrinology

## 2019-09-11 VITALS — BP 120/72 | HR 70 | Ht 63.0 in | Wt 146.0 lb

## 2019-09-11 DIAGNOSIS — E1151 Type 2 diabetes mellitus with diabetic peripheral angiopathy without gangrene: Secondary | ICD-10-CM

## 2019-09-11 DIAGNOSIS — Z20828 Contact with and (suspected) exposure to other viral communicable diseases: Secondary | ICD-10-CM | POA: Insufficient documentation

## 2019-09-11 DIAGNOSIS — Z01812 Encounter for preprocedural laboratory examination: Secondary | ICD-10-CM | POA: Insufficient documentation

## 2019-09-11 MED ORDER — CANAGLIFLOZIN 100 MG PO TABS: 100.0000 mg | ORAL_TABLET | Freq: Every day | ORAL | 11 refills | Status: DC

## 2019-09-11 NOTE — Patient Instructions (Addendum)
check your blood sugar three times a day.  vary the time of day when you check, between before the 3 meals, and at bedtime.  also check if you have symptoms of your blood sugar being too high or too low.  please keep a record of the readings and bring it to your next appointment here (or you can bring the meter itself).  You can write it on any piece of paper.  please call us sooner if your blood sugar goes below 70, or if you have a lot of readings over 200.   Please continue the same metformin.  I have sent a prescription to your pharmacy, to add "Invokana." Please come back for a follow-up appointment in 1-2 months.

## 2019-09-11 NOTE — Progress Notes (Signed)
Pre-op endo call completed.  Patient states she has been advised to hold ASA x24 hours

## 2019-09-11 NOTE — Progress Notes (Signed)
Subjective:    Patient ID: Jill Higgins, female    DOB: [birth year], 73 y.o.   MRN: [id number]  HPI Pt returns for f/u of diabetes mellitus:  DM type: 2 Dx'ed: 0000000 Complications: CAD and PAD Therapy: 2 oral meds. GDM: never DKA: never Severe hypoglycemia: never Pancreatitis: once (2020) Pancreatic imaging: normal on 2019 CT Other: she did not tolerate glipizide (hypoglycemia).  She cannot afford [place].   Interval history: she was recently in the hospital, with pancreatitis.  repaglinide was stopped.  no cbg record, but states cbg's vary from 100-200.   Past Medical History:  Diagnosis Date  . Bronchitis   . Bulging disc    in neck  . Depression with anxiety   . Diabetes mellitus    Type II  . Fibromyalgia    pt. denies  . GERD (gastroesophageal reflux disease)   . Glaucoma   . History of bronchitis   . Hyperlipidemia   . Hypertension   . Overactive bladder   . Peripheral vascular disease ([place])   . Pneumonia   . Sleep apnea    recent test negative for sleep apnea  . Tobacco abuse   . Urinary frequency     Past Surgical History:  Procedure Laterality Date  . ABDOMINAL AORTAGRAM N/A 08/15/2013   Procedure: ABDOMINAL Maxcine Ham;  Surgeon: Elam Dutch, MD;  [facility] C[facility] LAB;  Service: Cardiovascular;  Laterality: N/A;  . ABDOMINAL AORTAGRAM N/A 06/19/2014   Procedure: ABDOMINAL Maxcine Ham;  Surgeon: Elam Dutch, MD;  Location: [facility] CATH LAB;  Service: Cardiovascular;  Laterality: N/A;  . ABDOMINAL AORTOGRAM W/LOWER EXTREMITY N/A 05/10/2018   Procedure: ABDOMINAL AORTOGRAM W/LOWER EXTREMITY;  Surgeon: Elam Dutch, MD;  Location: [place] CV LAB;  Service: Cardiovascular;  Laterality: N/A;  bilateral  . Aortogram w/ PTA  05/14/08,  11-04-10   Bilateral aortogram w/ bilateral  SFA PTA  stenting   . BREAST SURGERY Right    boil removal  . BUNIONECTOMY     L foot in the 1980s  . COLONOSCOPY     2014  . DILATION AND CURETTAGE OF UTERUS     X4  .  ENDARTERECTOMY FEMORAL Right 06/03/2018   Procedure: RIGHT FEMORAL ENDARTERECTOMY;  Surgeon: Elam Dutch, MD;  Location: [facility] OR;  Service: Vascular;  Laterality: Right;  . Epidural shots in neck     . EYE SURGERY Bilateral 2016   cataract removal  . FEMORAL-POPLITEAL BYPASS GRAFT Left 10/21/2013   Procedure: LEFT FEMORAL-POPLITEAL ARTERY BYPASS WITH SAPHENOUS VEIN GRAFT , POPLITEAL ENDARTERECTOMY ,INTRAOPERATIVE ARTERIOGRAM, vein patch angioplasty to popliteal artery;  Surgeon: Elam Dutch, MD;  Location: [facility] OR;  Service: Vascular;  Laterality: Left;  . FEMORAL-POPLITEAL BYPASS GRAFT Right 02/08/2016   Procedure: Right FEMORAL- to Above Knee POPLITEAL ARTERY Bypass Graft with reversed saphenous vein and Common Femoral Endarterectomy  with profundoplasty;  Surgeon: Elam Dutch, MD;  Location: [place];  Service: Vascular;  Laterality: Right;  . GROIN DEBRIDEMENT Left 10/28/2013   Procedure: left inner thigh DEBRIDEMENT;  Surgeon: Elam Dutch, MD;  Location: [place];  Service: Vascular;  Laterality: Left;  . HAND SURGERY Left 2010  . LOWER EXTREMITY ANGIOGRAM Bilateral 12/24/2015   Procedure: Lower Extremity Angiogram;  Surgeon: Elam Dutch, MD;  Location: [place] CV LAB;  Service: Cardiovascular;  Laterality: Bilateral;  . PATCH ANGIOPLASTY Right 06/03/2018   Procedure: PATCH ANGIOPLASTY USING A XENOSURE 1CM X 14CM BIOLOGIC PATCH;  Surgeon: Elam Dutch,  MD;  Location: [place];  Service: Vascular;  Laterality: Right;  . PERIPHERAL VASCULAR CATHETERIZATION N/A 12/24/2015   Procedure: Abdominal Aortogram;  Surgeon: Elam Dutch, MD;  Location: [place] CV LAB;  Service: Cardiovascular;  Laterality: N/A;  . PERIPHERAL VASCULAR CATHETERIZATION Left 12/24/2015   Procedure: Peripheral Vascular Balloon Angioplasty;  Surgeon: Elam Dutch, MD;  Location: [place] CV LAB;  Service: Cardiovascular;  Laterality: Left;  drug coated balloon  . PERIPHERAL VASCULAR CATHETERIZATION  N/A 06/30/2016   Procedure: Abdominal Aortogram;  Surgeon: Elam Dutch, MD;  Location: [place] CV LAB;  Service: Cardiovascular;  Laterality: N/A;  . PERIPHERAL VASCULAR CATHETERIZATION Bilateral 06/30/2016   Procedure: Lower Extremity Angiography;  Surgeon: Elam Dutch, MD;  Location: [place] CV LAB;  Service: Cardiovascular;  Laterality: Bilateral;  . TONSILLECTOMY      Social History   Socioeconomic History  . Marital status: Single    Spouse name: Not on file  . Number of children: 1  . Years of education: Not on file  . Highest education level: Not on file  Occupational History  . Occupation: Retired    Fish farm manager: Chief of Staff  . Financial resource strain: Not on file  . Food insecurity    Worry: Not on file    Inability: Not on file  . Transportation needs    Medical: Not on file    Non-medical: Not on file  Tobacco Use  . Smoking status: Former Smoker    Packs/day: 0.10    Types: Cigarettes    Quit date: 10/2014    Years since quitting: 4.9  . Smokeless tobacco: Never Used  . Tobacco comment: pt states that she is using the E cig only   Substance and Sexual Activity  . Alcohol use: Yes    Alcohol/week: 0.0 - 1.0 standard drinks    Comment: Occasional; 10-22 rarely   . Drug use: No  . Sexual activity: Not on file  Lifestyle  . Physical activity    Days per week: Not on file    Minutes per session: Not on file  . Stress: Not on file  Relationships  . Social Herbalist on phone: Not on file    Gets together: Not on file    Attends religious service: Not on file    Active member of club or organization: Not on file    Attends meetings of clubs or organizations: Not on file    Relationship status: Not on file  . Intimate partner violence    Fear of current or ex partner: Not on file    Emotionally abused: Not on file    Physically abused: Not on file    Forced sexual activity: Not on file  Other Topics Concern  . Not on file   Social History Narrative   Drinks about 2 cups of coffee a day, and some tea     Current Outpatient Medications on File Prior to Visit  Medication Sig Dispense Refill  . Accu-Chek Softclix Lancets lancets 1 each by Other route 3 (three) times daily. Use to monitor glucose levels TID; E11.9    . amLODipine (NORVASC) 5 MG tablet Take 5 mg by mouth daily.     Marland Kitchen aspirin EC 81 MG tablet Take 81 mg by mouth at bedtime.     . bismuth subsalicylate (PEPTO BISMOL) 262 MG/15ML suspension Take 30 mLs by mouth every 6 (six) hours as needed for indigestion or diarrhea  or loose stools. Reported on 11/04/2015    . famotidine (PEPCID) 20 MG tablet Take 1 tablet (20 mg total) by mouth 2 (two) times daily. 20 tablet 0  . gabapentin (NEURONTIN) 300 MG capsule TAKE 1 CAPSULE BY MOUTH THREE TIMES A DAY (Patient taking differently: Take 300 mg by mouth 3 (three) times daily. ) 90 capsule 6  . glucose blood (ACCU-CHEK AVIVA PLUS) test strip 1 each by Other route 3 (three) times daily. 270 each 3  . lovastatin (MEVACOR) 20 MG tablet Take 20 mg by mouth every evening.     . metFORMIN (GLUCOPHAGE) 1000 MG tablet Take 1 tablet (1,000 mg total) by mouth 2 (two) times daily with a meal. 180 tablet 3  . methocarbamol (ROBAXIN) 500 MG tablet Take 1 tablet (500 mg total) by mouth 2 (two) times daily. 20 tablet 0  . metoprolol succinate (TOPROL-XL) 100 MG 24 hr tablet Take 100 mg by mouth daily.     . Netarsudil-Latanoprost (ROCKLATAN) 0.02-0.005 % SOLN Apply to eye.    Marland Kitchen OVER THE COUNTER MEDICATION Apply 1 patch topically daily as needed (pain). Apply to right shoulder    . pantoprazole (PROTONIX) 40 MG tablet Take 40 mg by mouth daily.    . polyethylene glycol (MIRALAX / GLYCOLAX) 17 g packet Take 17 g by mouth daily as needed for moderate constipation or severe constipation. 14 each 0  . PROAIR HFA 108 (90 BASE) MCG/ACT inhaler Inhale 2 puffs into the lungs every 4 (four) hours as needed for wheezing or shortness of  breath. Reported on 11/04/2015    . senna-docusate (SENOKOT-S) 8.6-50 MG tablet Take 2 tablets by mouth at bedtime as needed for mild constipation or moderate constipation. 30 tablet 1  . sucralfate (CARAFATE) 1 GM/10ML suspension Take 10 mLs (1 g total) by mouth 4 (four) times daily -  with meals and at bedtime. 420 mL 0  . temazepam (RESTORIL) 30 MG capsule Take 30 mg by mouth at bedtime as needed for sleep.  0   No current facility-administered medications on file prior to visit.     Allergies  Allergen Reactions  . Adhesive [Tape] Other (See Comments)    Pt states that tape and electrodes leave red "scars" on her skin and her skin is very sensitive.    Family History  Problem Relation Age of Onset  . Heart disease Father   . Heart attack Father        MI at age 15  . Hypertension Mother   . Alzheimer's disease Mother   . Hypertension Sister   . Diabetes Brother   . Hyperlipidemia Brother   . Hypertension Brother   . Heart disease Brother   . Heart attack Brother   . Breast cancer Maternal Aunt     BP 120/72 (BP Location: Left Arm, Patient Position: Sitting, Cuff Size: Normal)   Pulse 70   Ht 5\' 3"  (1.6 m)   Wt 146 lb (66.2 kg)   SpO2 98%   BMI 25.86 kg/m    Review of Systems She denies hypoglycemia.      Objective:   Physical Exam VITAL SIGNS:  See vs page GENERAL: no distress Pulses: dorsalis pedis intact bilat.   MSK: no deformity of the feet CV: trace bilat leg edema Skin:  no ulcer on the feet.  normal color and temp on the feet. Neuro: sensation is intact to touch on the feet.    Lab Results  Component Value Date  HGBA1C 7.0 (A) 08/04/2019   Lab Results  Component Value Date   CREATININE 0.68 09/04/2019   BUN 8 09/04/2019   NA 138 09/04/2019   K 3.8 09/04/2019   CL 104 09/04/2019   CO2 24 09/04/2019       Assessment & Plan:  Type 2 DM, with PAD: she needs increased rx, if it can be done with a regimen that avoids or minimizes hypoglycemia.  Edema: This limits rx options Pancreatitis, new.  Unlikely due to repaglinide, but we'll stay off to be sure.  Patient Instructions  check your blood sugar three times a day.  vary the time of day when you check, between before the 3 meals, and at bedtime.  also check if you have symptoms of your blood sugar being too high or too low.  please keep a record of the readings and bring it to your next appointment here (or you can bring the meter itself).  You can write it on any piece of paper.  please call us sooner if your blood sugar goes below 70, or if you have a lot of readings over 200.   Please continue the same metformin.  I have sent a prescription to your pharmacy, to add "Invokana." Please come back for a follow-up appointment in 1-2 months.

## 2019-09-12 LAB — NOVEL CORONAVIRUS, NAA (HOSP ORDER, SEND-OUT TO REF LAB; TAT 18-24 HRS): SARS-CoV-2, NAA: NOT DETECTED

## 2019-09-15 ENCOUNTER — Ambulatory Visit (HOSPITAL_COMMUNITY): Payer: Medicare Other | Admitting: Anesthesiology

## 2019-09-15 ENCOUNTER — Encounter (HOSPITAL_COMMUNITY): Payer: Self-pay | Admitting: *Deleted

## 2019-09-15 ENCOUNTER — Encounter (HOSPITAL_COMMUNITY)
Admission: RE | Disposition: A | Discharge status: Home / Self Care | Payer: Self-pay | Source: Home / Self Care | Attending: Gastroenterology

## 2019-09-15 ENCOUNTER — Ambulatory Visit (HOSPITAL_COMMUNITY)
Admission: RE | Admit: 2019-09-15 | Discharge: 2019-09-15 | Disposition: A | Discharge status: Home / Self Care | Payer: Medicare Other | Attending: Gastroenterology | Admitting: Gastroenterology

## 2019-09-15 ENCOUNTER — Other Ambulatory Visit: Payer: Self-pay

## 2019-09-15 ENCOUNTER — Other Ambulatory Visit: Payer: Self-pay | Admitting: Gastroenterology

## 2019-09-15 DIAGNOSIS — Z95820 Peripheral vascular angioplasty status with implants and grafts: Secondary | ICD-10-CM | POA: Diagnosis not present

## 2019-09-15 DIAGNOSIS — Z79899 Other long term (current) drug therapy: Secondary | ICD-10-CM | POA: Insufficient documentation

## 2019-09-15 DIAGNOSIS — K219 Gastro-esophageal reflux disease without esophagitis: Secondary | ICD-10-CM | POA: Insufficient documentation

## 2019-09-15 DIAGNOSIS — I10 Essential (primary) hypertension: Secondary | ICD-10-CM | POA: Insufficient documentation

## 2019-09-15 DIAGNOSIS — D490 Neoplasm of unspecified behavior of digestive system: Secondary | ICD-10-CM | POA: Insufficient documentation

## 2019-09-15 DIAGNOSIS — E1151 Type 2 diabetes mellitus with diabetic peripheral angiopathy without gangrene: Secondary | ICD-10-CM | POA: Insufficient documentation

## 2019-09-15 DIAGNOSIS — K859 Acute pancreatitis without necrosis or infection, unspecified: Secondary | ICD-10-CM | POA: Diagnosis not present

## 2019-09-15 DIAGNOSIS — Z7982 Long term (current) use of aspirin: Secondary | ICD-10-CM | POA: Insufficient documentation

## 2019-09-15 DIAGNOSIS — K838 Other specified diseases of biliary tract: Secondary | ICD-10-CM | POA: Diagnosis not present

## 2019-09-15 DIAGNOSIS — Z87891 Personal history of nicotine dependence: Secondary | ICD-10-CM | POA: Insufficient documentation

## 2019-09-15 DIAGNOSIS — R1013 Epigastric pain: Secondary | ICD-10-CM | POA: Diagnosis present

## 2019-09-15 DIAGNOSIS — Z7984 Long term (current) use of oral hypoglycemic drugs: Secondary | ICD-10-CM | POA: Diagnosis not present

## 2019-09-15 DIAGNOSIS — E785 Hyperlipidemia, unspecified: Secondary | ICD-10-CM | POA: Insufficient documentation

## 2019-09-15 HISTORY — PX: EUS: SHX5427

## 2019-09-15 HISTORY — PX: FINE NEEDLE ASPIRATION: SHX5430

## 2019-09-15 HISTORY — PX: ESOPHAGOGASTRODUODENOSCOPY (EGD) WITH PROPOFOL: SHX5813

## 2019-09-15 LAB — GLUCOSE, CAPILLARY: Glucose-Capillary: 161 mg/dL — ABNORMAL HIGH (ref 70–99)

## 2019-09-15 SURGERY — FINE NEEDLE ASPIRATION (FNA) RADIAL
Anesthesia: Monitor Anesthesia Care

## 2019-09-15 SURGERY — ESOPHAGOGASTRODUODENOSCOPY (EGD) WITH PROPOFOL
Anesthesia: Monitor Anesthesia Care

## 2019-09-15 MED ORDER — PROPOFOL 500 MG/50ML IV EMUL
INTRAVENOUS | Status: DC | PRN
  Administered 2019-09-15: 100 ug/kg/min via INTRAVENOUS

## 2019-09-15 MED ORDER — ONDANSETRON HCL 4 MG/2ML IJ SOLN
INTRAMUSCULAR | Status: DC | PRN
  Administered 2019-09-15: 4 mg via INTRAVENOUS

## 2019-09-15 MED ORDER — TRAMADOL HCL 50 MG PO TABS: 50.0000 mg | ORAL_TABLET | Freq: Four times a day (QID) | ORAL | 0 refills | Status: AC | PRN

## 2019-09-15 MED ORDER — SODIUM CHLORIDE 0.9 % IV SOLN: INTRAVENOUS | Status: DC

## 2019-09-15 MED ORDER — PROPOFOL 500 MG/50ML IV EMUL
INTRAVENOUS | Status: AC
  Filled 2019-09-15: qty 50

## 2019-09-15 MED ORDER — PROPOFOL 10 MG/ML IV BOLUS
INTRAVENOUS | Status: DC | PRN
  Administered 2019-09-15 (×4): 20 mg via INTRAVENOUS

## 2019-09-15 MED ORDER — LIDOCAINE 2% (20 MG/ML) 5 ML SYRINGE
INTRAMUSCULAR | Status: DC | PRN
  Administered 2019-09-15: 100 mg via INTRAVENOUS

## 2019-09-15 MED ORDER — LACTATED RINGERS IV SOLN
INTRAVENOUS | Status: DC
  Administered 2019-09-15: 1000 mL via INTRAVENOUS

## 2019-09-15 NOTE — Anesthesia Procedure Notes (Signed)
Procedure Name: MAC Date/Time: 09/15/2019 10:50 AM Performed by: Lollie Sails, CRNA Pre-anesthesia Checklist: Patient identified, Emergency Drugs available, Suction available, Patient being monitored and Timeout performed Oxygen Delivery Method: Nasal cannula

## 2019-09-15 NOTE — Anesthesia Preprocedure Evaluation (Signed)
Anesthesia Evaluation  Patient identified by MRN, date of birth, ID band Patient awake    Reviewed: Allergy & Precautions, NPO status , Patient's Chart, lab work & pertinent test results, reviewed documented beta blocker date and time   Airway Mallampati: II       Dental no notable dental hx. (+) Teeth Intact,    Pulmonary former smoker,    Pulmonary exam normal breath sounds clear to auscultation       Cardiovascular hypertension, Pt. on home beta blockers and Pt. on medications Normal cardiovascular exam Rhythm:Regular Rate:Normal     Neuro/Psych    GI/Hepatic GERD  Medicated and Controlled,  Endo/Other  diabetes, Oral Hypoglycemic Agents  Renal/GU      Musculoskeletal   Abdominal Normal abdominal exam  (+)   Peds  Hematology negative hematology ROS (+)   Anesthesia Other Findings   Reproductive/Obstetrics                             Anesthesia Physical Anesthesia Plan  ASA: II  Anesthesia Plan: MAC   Post-op Pain Management:    Induction:   PONV Risk Score and Plan:   Airway Management Planned: Nasal Cannula, Natural Airway and Mask  Additional Equipment: None  Intra-op Plan:   Post-operative Plan:   Informed Consent: I have reviewed the patients History and Physical, chart, labs and discussed the procedure including the risks, benefits and alternatives for the proposed anesthesia with the patient or authorized representative who has indicated his/her understanding and acceptance.       Plan Discussed with: CRNA  Anesthesia Plan Comments:         Anesthesia Quick Evaluation

## 2019-09-15 NOTE — Interval H&P Note (Signed)
History and Physical Interval Note:  09/15/2019 10:41 AM  Jill Higgins  has presented today for surgery, with the diagnosis of pancreatitis.  The various methods of treatment have been discussed with the patient and family. After consideration of risks, benefits and other options for treatment, the patient has consented to  Procedure(s): FULL UPPER ENDOSCOPIC ULTRASOUND (EUS) RADIAL (N/A) as a surgical intervention.  The patient's history has been reviewed, patient examined, no change in status, stable for surgery.  I have reviewed the patient's chart and labs.  Questions were answered to the patient's satisfaction.     Jill Higgins M  Assessment:  1.  Possible pancreatic mass 2.  Abdominal pain, improving.  Plan:  1.  Upper endoscopic ultrasound with possible fine needle aspiration. 2.  Risks (bleeding, infection, bowel perforation that could require surgery, sedation-related changes in cardiopulmonary systems), benefits (identification and possible treatment of source of symptoms, exclusion of certain causes of symptoms), and alternatives (watchful waiting, radiographic imaging studies, empiric medical treatment) of upper endoscopy with ultrasound and possible fine needle aspiration (EUS+/- FNA) were explained to patient/family in detail and patient wishes to proceed.

## 2019-09-15 NOTE — Op Note (Signed)
[facility] Patient Name: Jill Higgins Procedure Date: 09/15/2019 MRN: [id number] Attending MD: Arta Silence , MD Date of Birth: 01/23/1946 CSN: [id number] Age: 73 Admit Type: Outpatient Procedure:                Upper EUS Indications:              Suspected mass in pancreas on MRI Providers:                Arta Silence, MD, Carlyn Reichert, RN, Marguerita Merles, Technician Referring MD:              Medicines:                Monitored Anesthesia Care Complications:            No immediate complications. Estimated Blood Loss:     Estimated blood loss: none. Procedure:                Pre-Anesthesia Assessment:                           - Prior to the procedure, a History and Physical                            was performed, and patient medications and                            allergies were reviewed. The patient's tolerance of                            previous anesthesia was also reviewed. The risks                            and benefits of the procedure and the sedation                            options and risks were discussed with the patient.                            All questions were answered, and informed consent                            was obtained. Prior Anticoagulants: The patient has                            taken no previous anticoagulant or antiplatelet                            agents except for aspirin. ASA Grade Assessment:                            III - A patient with severe systemic disease. After  reviewing the risks and benefits, the patient was                            deemed in satisfactory condition to undergo the                            procedure.                           After obtaining informed consent, the endoscope was                            passed under direct vision. Throughout the                            procedure, the patient's blood pressure, pulse, and                             oxygen saturations were monitored continuously. The                            [id number]) Olympus Linear EUS was                            introduced through the mouth, and advanced to the                            second part of duodenum. The upper EUS was                            accomplished without difficulty. The patient                            tolerated the procedure well. Scope In: Scope Out: Findings:      ENDOSONOGRAPHIC FINDING: :      There was no sign of significant endosonographic abnormality in the left       lobe of the liver.      No lymphadenopathy seen.      There was dilation in the main bile duct which measured up to 9 mm.      Vasgue irregularity was identified in the pancreatic head. The region       was heterogenous. The mass measured 15 mm by 17 mm in maximal       cross-sectional diameter, with upstream biliary and pancreatic ductal       dilatation (LFTs normal). The endosonographic borders were       poorly-defined. An intact interface was seen between the mass and the       adjacent structures suggesting a lack of invasion. The remainder of the       pancreas was examined. The endosonographic appearance of parenchyma and       the upstream pancreatic duct indicated duct dilation. Fine needle       aspiration for cytology was performed. Color Doppler imaging was       utilized prior to needle puncture to confirm a lack of significant       vascular structures  within the needle path. Four passes were made with       the 25 gauge needle using a transduodenal approach. A stylet was used. A       cytologist was present and performed a preliminary cytologic       examination. Final cytology results are pending. Impression:               - There was no evidence of significant pathology in                            the left lobe of the liver.                           - There was dilation in the entire main bile duct                             which measured up to 9 mm.                           - Vague irrregularity was identified in the                            pancreatic head with upstream pancreatic and                            biliary ductal dilatation. Not certain this is                            tumor, but if adenocarcinoma, it would be staged T2                            N0 Mx by endosonographic criteria. Fine needle                            aspiration performed. Moderate Sedation:      None Recommendation:           - Discharge patient to home (via wheelchair).                           - Resume previous diet today.                           - Continue present medications.                           - Await cytology results.                           - Return to GI clinic after studies are complete.                           - Return to referring physician as previously                            scheduled. Procedure Code(s):        ---  Professional ---                           [phone], Esophagogastroduodenoscopy, flexible,                            transoral; with transendoscopic ultrasound-guided                            intramural or transmural fine needle                            aspiration/biopsy(s) (includes endoscopic                            ultrasound examination of the esophagus, stomach,                            and either the duodenum or a surgically altered                            stomach where the jejunum is examined distal to the                            anastomosis) Diagnosis Code(s):        --- Professional ---                           K86.89, Other specified diseases of pancreas                           K83.8, Other specified diseases of biliary tract                           R93.3, Abnormal findings on diagnostic imaging of                            other parts of digestive tract CPT copyright 2019 [employer]. All rights  reserved. The codes documented in this report are preliminary and upon coder review may  be revised to meet current compliance requirements. Arta Silence, MD 09/15/2019 11:49:00 AM This report has been signed electronically. Number of Addenda: 0

## 2019-09-15 NOTE — Anesthesia Postprocedure Evaluation (Signed)
Anesthesia Post Note  Patient: Jill Higgins  Procedure(s) Performed: ESOPHAGOGASTRODUODENOSCOPY (EGD) WITH PROPOFOL (N/A ) UPPER ENDOSCOPIC ULTRASOUND (EUS) LINEAR (N/A ) FINE NEEDLE ASPIRATION (FNA) LINEAR (N/A )     Patient location during evaluation: Endoscopy Anesthesia Type: MAC Level of consciousness: awake Pain management: pain level controlled Vital Signs Assessment: post-procedure vital signs reviewed and stable Respiratory status: spontaneous breathing Cardiovascular status: stable Postop Assessment: no apparent nausea or vomiting Anesthetic complications: no    Last Vitals:  Vitals:   09/15/19 1200 09/15/19 1210  BP: (!) 143/65 (!) 159/74  Pulse: 64 (!) 59  Resp: 18 12  Temp:    SpO2: 99% 94%    Last Pain:  Vitals:   09/15/19 1210  TempSrc:   PainSc: 0-No pain   Pain Goal:                   Huston Foley

## 2019-09-15 NOTE — Discharge Instructions (Signed)
YOU HAD AN ENDOSCOPIC PROCEDURE TODAY: Refer to the procedure report and other information in the discharge instructions given to you for any specific questions about what was found during the examination. If this information does not answer your questions, please call [facility] office at [phone] to clarify.  ° °YOU SHOULD EXPECT: Some feelings of bloating in the abdomen. Passage of more gas than usual. Walking can help get rid of the air that was put into your GI tract during the procedure and reduce the bloating. If you had a lower endoscopy (such as a colonoscopy or flexible sigmoidoscopy) you may notice spotting of blood in your stool or on the toilet paper. Some abdominal soreness may be present for a day or two, also. ° °DIET: Your first meal following the procedure should be a light meal and then it is ok to progress to your normal diet. A half-sandwich or bowl of soup is an example of a good first meal. Heavy or fried foods are harder to digest and may make you feel nauseous or bloated. Drink plenty of fluids but you should avoid alcoholic beverages for 24 hours.   ° °ACTIVITY: Your care partner should take you home directly after the procedure. You should plan to take it easy, moving slowly for the rest of the day. You can resume normal activity the day after the procedure however YOU SHOULD NOT DRIVE, use power tools, machinery or perform tasks that involve climbing or major physical exertion for 24 hours (because of the sedation medicines used during the test).  ° °SYMPTOMS TO REPORT IMMEDIATELY: °A gastroenterologist can be reached at any hour. Please call [phone]  for any of the following symptoms:  °Following upper endoscopy (EGD, EUS, ERCP, esophageal dilation) °Vomiting of blood or coffee ground material  °New, significant abdominal pain  °New, significant chest pain or pain under the shoulder blades  °Painful or persistently difficult swallowing  °New shortness of breath  °Black,  tarry-looking or red, bloody stools ° °FOLLOW UP:  °If any biopsies were taken you will be contacted by phone or by letter within the next 1-3 weeks. Call [phone]  if you have not heard about the biopsies in 3 weeks.  °Please also call with any specific questions about appointments or follow up tests.  °

## 2019-09-15 NOTE — Transfer of Care (Signed)
Immediate Anesthesia Transfer of Care Note  Patient: Jill Higgins  Procedure(s) Performed: ESOPHAGOGASTRODUODENOSCOPY (EGD) WITH PROPOFOL (N/A ) UPPER ENDOSCOPIC ULTRASOUND (EUS) LINEAR (N/A ) FINE NEEDLE ASPIRATION (FNA) LINEAR (N/A )  Patient Location: PACU  Anesthesia Type:MAC  Level of Consciousness: awake, alert , oriented and patient cooperative  Airway & Oxygen Therapy: Patient Spontanous Breathing and Patient connected to face mask oxygen  Post-op Assessment: Report given to RN, Post -op Vital signs reviewed and stable and Patient moving all extremities  Post vital signs: Reviewed and stable  Last Vitals:  Vitals Value Taken Time  BP    Temp    Pulse 62 09/15/19 1149  Resp 18 09/15/19 1149  SpO2 100 % 09/15/19 1149  Vitals shown include unvalidated device data.  Last Pain:  Vitals:   09/15/19 0926  TempSrc: Oral  PainSc: 0-No pain         Complications: No apparent anesthesia complications

## 2019-09-16 ENCOUNTER — Encounter (HOSPITAL_COMMUNITY): Payer: Self-pay | Admitting: Gastroenterology

## 2019-09-17 LAB — CYTOLOGY - NON PAP

## 2019-10-08 ENCOUNTER — Telehealth: Payer: Self-pay | Admitting: Endocrinology

## 2019-10-08 NOTE — Addendum Note (Signed)
Addended by: Renato Shin on: 10/08/2019 06:24 PM   Modules accepted: Orders

## 2019-10-08 NOTE — Telephone Encounter (Signed)
Routing this message to Dr. Loanne Drilling to address if appt is required for surgical clearance. Will await his response.

## 2019-10-08 NOTE — Telephone Encounter (Signed)
OK, please d/c invokana and Please continue the same metformin.

## 2019-10-08 NOTE — Telephone Encounter (Signed)
Patient ph# [phone] called re: patient is having pancreatic surgery at [facility] on 10/22/19 and was told by her Surgeon to let Dr. Loanne Drilling know that there may be problems with the fluctuation of patient's blood sugar numbers. Dr. Loanne Drilling prescribed Invokana at Patient's last appointment. Patient does not think that Anastasio Auerbach is working. Please call patient at the ph# listed above to advise.

## 2019-10-08 NOTE — Telephone Encounter (Signed)
OK, but please continue it for now.  Please come back here for f/u soon after you get out of the hospital.

## 2019-10-08 NOTE — Telephone Encounter (Signed)
Called pt and made her aware of Dr. Cordelia Pen instructions. Verbalized acceptance and understanding. States she is no longer able to afford Invokana. Is asking to be switched to something more affordable. Has 4 tablets remaining and states she will not be picking up the refill due to cost. Please advise

## 2019-10-09 ENCOUNTER — Other Ambulatory Visit: Payer: Self-pay

## 2019-10-09 ENCOUNTER — Ambulatory Visit
Admission: RE | Admit: 2019-10-09 | Discharge: 2019-10-09 | Disposition: A | Discharge status: Home / Self Care | Payer: Medicare Other | Source: Ambulatory Visit | Attending: Family Medicine | Admitting: Family Medicine

## 2019-10-09 ENCOUNTER — Ambulatory Visit: Payer: Medicare Other | Admitting: Endocrinology

## 2019-10-09 DIAGNOSIS — Z1231 Encounter for screening mammogram for malignant neoplasm of breast: Secondary | ICD-10-CM

## 2019-10-09 NOTE — Telephone Encounter (Signed)
Called pt on home # to inform about Dr. Cordelia Pen response below. LVM requesting returned call.

## 2019-10-09 NOTE — Telephone Encounter (Signed)
SECOND ATTEMPT:  Called pt and informed her about Dr. Cordelia Pen new orders. Verbalized acceptance and understanding. Denied needing any refills at this time for Metformin.

## 2019-10-20 ENCOUNTER — Telehealth: Payer: Self-pay

## 2019-10-20 NOTE — Telephone Encounter (Signed)
Patient A1C has gone 7.0 to 8.2 and the hospital took her off medication due to surgery and she wants to know if she can start taking them again   REPAGLINIDE 1mg  x2 daily    Please call and advise

## 2019-10-20 NOTE — Telephone Encounter (Signed)
Called pt and canceled f/u appt for 12/17 and rescheduled her for the earliest that she is available. She was scheduled for 10/28/2019 at 1:30pm. It is is telephone call visit due to the pt being in the hospital at that time for a pancreatic surgery.

## 2019-10-20 NOTE — Telephone Encounter (Signed)
Please advise f/u next avail-vv is OK

## 2019-10-28 ENCOUNTER — Ambulatory Visit: Payer: Medicare Other | Admitting: Endocrinology

## 2019-11-06 ENCOUNTER — Ambulatory Visit: Payer: Medicare Other | Admitting: Endocrinology

## 2019-12-04 ENCOUNTER — Ambulatory Visit: Payer: Medicare Other | Admitting: Endocrinology

## 2019-12-09 ENCOUNTER — Other Ambulatory Visit: Payer: Self-pay

## 2019-12-09 ENCOUNTER — Encounter: Payer: Self-pay | Admitting: Endocrinology

## 2019-12-09 ENCOUNTER — Encounter: Payer: Medicare Other | Admitting: Endocrinology

## 2019-12-22 ENCOUNTER — Other Ambulatory Visit: Payer: Self-pay

## 2019-12-24 ENCOUNTER — Other Ambulatory Visit: Payer: Self-pay

## 2019-12-24 ENCOUNTER — Encounter: Payer: Self-pay | Admitting: Endocrinology

## 2019-12-24 ENCOUNTER — Ambulatory Visit (INDEPENDENT_AMBULATORY_CARE_PROVIDER_SITE_OTHER): Payer: Medicare Other | Admitting: Endocrinology

## 2019-12-24 VITALS — BP 116/70 | HR 82 | Ht 63.0 in | Wt 133.8 lb

## 2019-12-24 DIAGNOSIS — E1151 Type 2 diabetes mellitus with diabetic peripheral angiopathy without gangrene: Secondary | ICD-10-CM | POA: Diagnosis not present

## 2019-12-24 LAB — POCT GLYCOSYLATED HEMOGLOBIN (HGB A1C): Hemoglobin A1C: 7.8 % — AB (ref 4.0–5.6)

## 2019-12-24 MED ORDER — REPAGLINIDE 2 MG PO TABS: 2.0000 mg | ORAL_TABLET | Freq: Two times a day (BID) | ORAL | 3 refills | Status: DC

## 2019-12-24 NOTE — Patient Instructions (Addendum)
check your blood sugar three times a day.  vary the time of day when you check, between before the 3 meals, and at bedtime.  also check if you have symptoms of your blood sugar being too high or too low.  please keep a record of the readings and bring it to your next appointment here (or you can bring the meter itself).  You can write it on any piece of paper.  please call us sooner if your blood sugar goes below 70, or if you have a lot of readings over 200.   Please continue the same metformin.  I have sent a prescription to your pharmacy, to add increase the repaglinide. Please come back for a follow-up appointment in 2 months.

## 2019-12-24 NOTE — Progress Notes (Signed)
Subjective:    Patient ID: Jill Higgins, female    DOB: 10-Sep-1946, 74 y.o.   MRN: [id number]  HPI Pt returns for f/u of diabetes mellitus:  DM type: 2 Dx'ed: 0000000 Complications: CAD and PAD.   Therapy: 2 oral meds. GDM: never DKA: never Severe hypoglycemia: never Pancreatitis: once (2020) Pancreatic imaging: normal on 2019 CT.   SDOH: She cannot afford name brand meds.  Other: she did not tolerate glipizide (hypoglycemia).   Interval history: She had 2 steroid injections into the left hand, yesterday. This increased cbg to 300.  Prior to that, no cbg record, but states cbg's vary from 111-250.  She had partial pancreatectomy 12/20 (path was benign).  She could not afford Invokana.   Past Medical History:  Diagnosis Date  . Bronchitis   . Bulging disc    in neck  . Depression with anxiety   . Diabetes mellitus    Type II  . Fibromyalgia    pt. denies  . GERD (gastroesophageal reflux disease)   . Glaucoma   . History of bronchitis   . Hyperlipidemia   . Hypertension   . Overactive bladder   . Peripheral vascular disease ([place])   . Pneumonia   . Sleep apnea    recent test negative for sleep apnea  . Tobacco abuse   . Urinary frequency     Past Surgical History:  Procedure Laterality Date  . ABDOMINAL AORTAGRAM N/A 08/15/2013   Procedure: ABDOMINAL Maxcine Ham;  Surgeon: Elam Dutch, MD;  Location: [facility] CATH LAB;  Service: Cardiovascular;  Laterality: N/A;  . ABDOMINAL AORTAGRAM N/A 06/19/2014   Procedure: ABDOMINAL Maxcine Ham;  Surgeon: Elam Dutch, MD;  Location: [facility] CATH LAB;  Service: Cardiovascular;  Laterality: N/A;  . ABDOMINAL AORTOGRAM W/LOWER EXTREMITY N/A 05/10/2018   Procedure: ABDOMINAL AORTOGRAM W/LOWER EXTREMITY;  Surgeon: Elam Dutch, MD;  Location: [place] CV LAB;  Service: Cardiovascular;  Laterality: N/A;  bilateral  . Aortogram w/ PTA  05/14/08,  11-04-10   Bilateral aortogram w/ bilateral  SFA PTA  stenting   . BREAST SURGERY Right      boil removal  . BUNIONECTOMY     L foot in the 1980s  . COLONOSCOPY     2014  . DILATION AND CURETTAGE OF UTERUS     X4  . ENDARTERECTOMY FEMORAL Right 06/03/2018   Procedure: RIGHT FEMORAL ENDARTERECTOMY;  Surgeon: Elam Dutch, MD;  Location: [facility] OR;  Service: Vascular;  Laterality: Right;  . Epidural shots in neck     . ESOPHAGOGASTRODUODENOSCOPY (EGD) WITH PROPOFOL N/A 09/15/2019   Procedure: ESOPHAGOGASTRODUODENOSCOPY (EGD) WITH PROPOFOL;  Surgeon: Arta Silence, MD;  Location: [facility] ENDOSCOPY;  Service: Endoscopy;  Laterality: N/A;  . EUS N/A 09/15/2019   Procedure: UPPER ENDOSCOPIC ULTRASOUND (EUS) LINEAR;  Surgeon: Arta Silence, MD;  Location: [facility] ENDOS[facility];  Service: Endoscopy;  Laterality: N/A;  . EYE SURGERY Bilateral 2016   cataract removal  . FEMORAL-POPLITEAL BYPASS GRAFT Left 10/21/2013   Procedure: LEFT FEMORAL-POPLITEAL ARTERY BYPASS WITH SAPHENOUS VEIN GRAFT , POPLITEAL ENDARTERECTOMY ,INTRAOPERATIVE ARTERIOGRAM, vein patch angioplasty to popliteal artery;  Surgeon: Elam Dutch, MD;  Location: [place];  Service: Vascular;  Laterality: Left;  . FEMORAL-POPLITEAL BYPASS GRAFT Right 02/08/2016   Procedure: Right FEMORAL- to Above Knee POPLITEAL ARTERY Bypass Graft with reversed saphenous vein and Common Femoral Endarterectomy  with profundoplasty;  Surgeon: Elam Dutch, MD;  Location: [place];  Service: Vascular;  Laterality: Right;  . FINE  NEEDLE ASPIRATION N/A 09/15/2019   Procedure: FINE NEEDLE ASPIRATION (FNA) LINEAR;  Surgeon: Arta Silence, MD;  [facility];  Service: Endoscopy;  Laterality: N/A;  . GROIN DEBRIDEMENT Left 10/28/2013   Procedure: left inner thigh DEBRIDEMENT;  Surgeon: Elam Dutch, MD;  Location: [place];  Service: Vascular;  Laterality: Left;  . HAND SURGERY Left 2010  . LOWER EXTREMITY ANGIOGRAM Bilateral 12/24/2015   Procedure: Lower Extremity Angiogram;  Surgeon: Elam Dutch, MD;  Location: [place] CV LAB;  Service:  Cardiovascular;  Laterality: Bilateral;  . PATCH ANGIOPLASTY Right 06/03/2018   Procedure: PATCH ANGIOPLASTY USING A XENOSURE 1CM X 14CM BIOLOGIC PATCH;  Surgeon: Elam Dutch, MD;  Location: [place];  Service: Vascular;  Laterality: Right;  . PERIPHERAL VASCULAR CATHETERIZATION N/A 12/24/2015   Procedure: Abdominal Aortogram;  Surgeon: Elam Dutch, MD;  Location: [place] CV LAB;  Service: Cardiovascular;  Laterality: N/A;  . PERIPHERAL VASCULAR CATHETERIZATION Left 12/24/2015   Procedure: Peripheral Vascular Balloon Angioplasty;  Surgeon: Elam Dutch, MD;  Location: [place] CV LAB;  Service: Cardiovascular;  Laterality: Left;  drug coated balloon  . PERIPHERAL VASCULAR CATHETERIZATION N/A 06/30/2016   Procedure: Abdominal Aortogram;  Surgeon: Elam Dutch, MD;  Location: [place] CV LAB;  Service: Cardiovascular;  Laterality: N/A;  . PERIPHERAL VASCULAR CATHETERIZATION Bilateral 06/30/2016   Procedure: Lower Extremity Angiography;  Surgeon: Elam Dutch, MD;  Location: [place] CV LAB;  Service: Cardiovascular;  Laterality: Bilateral;  . TONSILLECTOMY      Social History   Socioeconomic History  . Marital status: Single    Spouse name: Not on file  . Number of children: 1  . Years of education: Not on file  . Highest education level: Not on file  Occupational History  . 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 Smoking status: Former Smoker    Packs/day: 0.10    Types: Cigarettes    Quit date: 10/2014    Years since quitting: 5.1  . Smokeless tobacco: Never Used  . Tobacco comment: pt states that she is using the E cig only   Substance and Sexual Activity  . Alcohol use: Yes    Alcohol/week: 0.0 - 1.0 standard drinks    Comment: Occasional; 10-22 rarely   . Drug use: No  . Sexual activity: Not on file  Other Topics Concern  . Not on file  Social History Narrative   Drinks about 2 cups of coffee a day, and some tea    Social  Determinants of Health   Financial Resource Strain:   . Difficulty of Paying Living Expenses: Not on file  Food Insecurity:   . Worried About Charity fundraiser in the Last Year: Not on file  . Ran Out of Food in the Last Year: Not on file  Transportation Needs:   . Lack of Transportation (Medical): Not on file  . Lack of Transportation (Non-Medical): Not on file  Physical Activity:   . Days of Exercise per Week: Not on file  . Minutes of Exercise per Session: Not on file  Stress:   . Feeling of Stress : Not on file  Social Connections:   . Frequency of Communication with Friends and Family: Not on file  . Frequency of Social Gatherings with Friends and Family: Not on file  . Attends Religious Services: Not on file  . Active Member of Clubs or Organizations: Not on file  . Attends  Club or Organization Meetings: Not on file  . Marital Status: Not on file  Intimate Partner Violence:   . Fear of Current or Ex-Partner: Not on file  . Emotionally Abused: Not on file  . Physically Abused: Not on file  . Sexually Abused: Not on file    Current Outpatient Medications on File Prior to Visit  Medication Sig Dispense Refill  . Accu-Chek Softclix Lancets lancets 1 each by Other route 3 (three) times daily. Use to monitor glucose levels TID; E11.9    . amLODipine (NORVASC) 5 MG tablet Take 5 mg by mouth daily.     Marland Kitchen aspirin EC 81 MG tablet Take 81 mg by mouth at bedtime.     . bismuth subsalicylate (PEPTO BISMOL) 262 MG/15ML suspension Take 30 mLs by mouth every 6 (six) hours as needed for indigestion or diarrhea or loose stools. Reported on 11/04/2015    . famotidine (PEPCID) 20 MG tablet Take 1 tablet (20 mg total) by mouth 2 (two) times daily. 20 tablet 0  . gabapentin (NEURONTIN) 300 MG capsule TAKE 1 CAPSULE BY MOUTH THREE TIMES A DAY (Patient taking differently: Take 300 mg by mouth 3 (three) times daily. ) 90 capsule 6  . glucose blood (ACCU-CHEK AVIVA PLUS) test strip 1 each by Other  route 3 (three) times daily. 270 each 3  . lovastatin (MEVACOR) 20 MG tablet Take 20 mg by mouth every evening.     . metFORMIN (GLUCOPHAGE) 1000 MG tablet Take 1 tablet (1,000 mg total) by mouth 2 (two) times daily with a meal. 180 tablet 3  . methocarbamol (ROBAXIN) 500 MG tablet Take 1 tablet (500 mg total) by mouth 2 (two) times daily. 20 tablet 0  . metoprolol succinate (TOPROL-XL) 100 MG 24 hr tablet Take 100 mg by mouth daily.     . Netarsudil-Latanoprost (ROCKLATAN) 0.02-0.005 % SOLN Apply to eye.    Marland Kitchen OVER THE COUNTER MEDICATION Apply 1 patch topically daily as needed (pain). Apply to right shoulder    . pantoprazole (PROTONIX) 40 MG tablet Take 40 mg by mouth daily.    . polyethylene glycol (MIRALAX / GLYCOLAX) 17 g packet Take 17 g by mouth daily as needed for moderate constipation or severe constipation. 14 each 0  . PROAIR HFA 108 (90 BASE) MCG/ACT inhaler Inhale 2 puffs into the lungs every 4 (four) hours as needed for wheezing or shortness of breath. Reported on 11/04/2015    . senna-docusate (SENOKOT-S) 8.6-50 MG tablet Take 2 tablets by mouth at bedtime as needed for mild constipation or moderate constipation. 30 tablet 1  . sucralfate (CARAFATE) 1 GM/10ML suspension Take 10 mLs (1 g total) by mouth 4 (four) times daily -  with meals and at bedtime. 420 mL 0  . temazepam (RESTORIL) 30 MG capsule Take 30 mg by mouth at bedtime as needed for sleep.  0  . traMADol (ULTRAM) 50 MG tablet Take 1 tablet (50 mg total) by mouth every 6 (six) hours as needed. 20 tablet 0   No current facility-administered medications on file prior to visit.    Allergies  Allergen Reactions  . Adhesive [Tape] Other (See Comments)    Pt states that tape and electrodes leave red "scars" on her skin and her skin is very sensitive.    Family History  Problem Relation Age of Onset  . Heart disease Father   . Heart attack Father        MI at age 65  . Hypertension  Mother   . Alzheimer's disease Mother     . Hypertension Sister   . Diabetes Brother   . Hyperlipidemia Brother   . Hypertension Brother   . Heart disease Brother   . Heart attack Brother   . Breast cancer Maternal Aunt     BP 116/70 (BP Location: Left Arm, Patient Position: Sitting, Cuff Size: Normal)   Pulse 82   Ht 5\' 3"  (1.6 m)   Wt 133 lb 12.8 oz (60.7 kg)   SpO2 98%   BMI 23.70 kg/m    Review of Systems She denies hypoglycemia    Objective:   Physical Exam VITAL SIGNS:  See vs page GENERAL: no distress Pulses: dorsalis pedis intact bilat.   MSK: no deformity of the feet CV: no leg edema Skin:  no ulcer on the feet.  normal color and temp on the feet. Neuro: sensation is intact to touch on the feet   Lab Results  Component Value Date   HGBA1C 7.8 (A) 12/24/2019   Lab Results  Component Value Date   CREATININE 0.68 09/04/2019   BUN 8 09/04/2019   NA 138 09/04/2019   K 3.8 09/04/2019   CL 104 09/04/2019   CO2 24 09/04/2019       Assessment & Plan:  Type 2 DM, with PAD: worse SDOH: This limits rx options   Patient Instructions  check your blood sugar three times a day.  vary the time of day when you check, between before the 3 meals, and at bedtime.  also check if you have symptoms of your blood sugar being too high or too low.  please keep a record of the readings and bring it to your next appointment here (or you can bring the meter itself).  You can write it on any piece of paper.  please call us sooner if your blood sugar goes below 70, or if you have a lot of readings over 200.   Please continue the same metformin.  I have sent a prescription to your pharmacy, to add increase the repaglinide. Please come back for a follow-up appointment in 2 months.

## 2020-02-25 ENCOUNTER — Ambulatory Visit: Payer: Medicare Other | Admitting: Endocrinology

## 2020-03-26 ENCOUNTER — Other Ambulatory Visit: Payer: Self-pay

## 2020-03-30 ENCOUNTER — Ambulatory Visit: Payer: Medicare Other | Admitting: Endocrinology

## 2020-03-30 ENCOUNTER — Other Ambulatory Visit: Payer: Self-pay

## 2020-03-30 ENCOUNTER — Encounter: Payer: Self-pay | Admitting: Endocrinology

## 2020-03-30 VITALS — BP 120/68 | HR 83 | Ht 63.0 in | Wt 128.0 lb

## 2020-03-30 DIAGNOSIS — E1151 Type 2 diabetes mellitus with diabetic peripheral angiopathy without gangrene: Secondary | ICD-10-CM | POA: Diagnosis not present

## 2020-03-30 LAB — POCT GLYCOSYLATED HEMOGLOBIN (HGB A1C): Hemoglobin A1C: 8.5 % — AB (ref 4.0–5.6)

## 2020-03-30 MED ORDER — BROMOCRIPTINE MESYLATE 2.5 MG PO TABS: 2.5000 mg | ORAL_TABLET | Freq: Every day | ORAL | 11 refills | Status: DC

## 2020-03-30 NOTE — Patient Instructions (Addendum)
check your blood sugar three times a day.  vary the time of day when you check, between before the 3 meals, and at bedtime.  also check if you have symptoms of your blood sugar being too high or too low.  please keep a record of the readings and bring it to your next appointment here (or you can bring the meter itself).  You can write it on any piece of paper.  please call us sooner if your blood sugar goes below 70, or if you have a lot of readings over 200.   Please start taking "bromocriptine," to help your blood sugar. It has possible side effects of nausea and dizziness.  These go away with time.  You can avoid these by taking it at bedtime, and by taking just take 1/2 pill for the first week.   Please continue the same other medications  Please come back for a follow-up appointment in 2 months.

## 2020-03-30 NOTE — Progress Notes (Signed)
Subjective:    Patient ID: Jill Higgins, female    DOB: June 19, 1946, 75 y.o.   MRN: [id number]  HPI Pt returns for f/u of diabetes mellitus:  DM type: 2 Dx'ed: 0000000 Complications: CAD and PAD.   Therapy: 2 oral meds. GDM: never DKA: never Severe hypoglycemia: never Pancreatitis: once (2020) Pancreatic imaging: normal on 2019 CT.   SDOH: She cannot afford name brand meds. Other: she did not tolerate glipizide (hypoglycemia); She had partial pancreatectomy 12/20 (benign). Interval history:  pt states she feels well in general.  She will have right foot surgery in 2 days.  no cbg record, but states cbg's vary from 50-200's.  It is in general higher as the day goes on.   Past Medical History:  Diagnosis Date  . Bronchitis   . Bulging disc    in neck  . Depression with anxiety   . Diabetes mellitus    Type II  . Fibromyalgia    pt. denies  . GERD (gastroesophageal reflux disease)   . Glaucoma   . History of bronchitis   . Hyperlipidemia   . Hypertension   . Overactive bladder   . Peripheral vascular disease ([place])   . Pneumonia   . Sleep apnea    recent test negative for sleep apnea  . Tobacco abuse   . Urinary frequency     Past Surgical History:  Procedure Laterality Date  . ABDOMINAL AORTAGRAM N/A 08/15/2013   Procedure: ABDOMINAL Maxcine Ham;  Surgeon: Elam Dutch, MD;  Location: [facility] CATH LAB;  Service: Cardiovascular;  Laterality: N/A;  . ABDOMINAL AORTAGRAM N/A 06/19/2014   Procedure: ABDOMINAL Maxcine Ham;  Surgeon: Elam Dutch, MD;  Location: [facility] CATH LAB;  Service: Cardiovascular;  Laterality: N/A;  . ABDOMINAL AORTOGRAM W/LOWER EXTREMITY N/A 05/10/2018   Procedure: ABDOMINAL AORTOGRAM W/LOWER EXTREMITY;  Surgeon: Elam Dutch, MD;  Location: [place] CV LAB;  Service: Cardiovascular;  Laterality: N/A;  bilateral  . Aortogram w/ PTA  05/14/08,  11-04-10   Bilateral aortogram w/ bilateral  SFA PTA  stenting   . BREAST SURGERY Right    boil removal   . BUNIONECTOMY     L foot in the 1980s  . COLONOSCOPY     2014  . DILATION AND CURETTAGE OF UTERUS     X4  . ENDARTERECTOMY FEMORAL Right 06/03/2018   Procedure: RIGHT FEMORAL ENDARTERECTOMY;  Surgeon: Elam Dutch, MD;  Location: [facility] OR;  Service: Vascular;  Laterality: Right;  . Epidural shots in neck     . ESOPHAGOGASTRODUODENOSCOPY (EGD) WITH PROPOFOL N/A 09/15/2019   Procedure: ESOPHAGOGASTRODUODENOSCOPY (EGD) WITH PROPOFOL;  Surgeon: Arta Silence, MD;  [facility];  Service: Endoscopy;  Laterality: N/A;  . EUS N/A 09/15/2019   Procedure: UPPER ENDOSCOPIC ULTRASOUND (EUS) LINEAR;  Surgeon: Arta Silence, MD;  Location: [facility];  Service: Endoscopy;  Laterality: N/A;  . EYE SURGERY Bilateral 2016   cataract removal  . FEMORAL-POPLITEAL BYPASS GRAFT Left 10/21/2013   Procedure: LEFT FEMORAL-POPLITEAL ARTERY BYPASS WITH SAPHENOUS VEIN GRAFT , POPLITEAL ENDARTERECTOMY ,INTRAOPERATIVE ARTERIOGRAM, vein patch angioplasty to popliteal artery;  Surgeon: Elam Dutch, MD;  Location: [place];  Service: Vascular;  Laterality: Left;  . FEMORAL-POPLITEAL BYPASS GRAFT Right 02/08/2016   Procedure: Right FEMORAL- to Above Knee POPLITEAL ARTERY Bypass Graft with reversed saphenous vein and Common Femoral Endarterectomy  with profundoplasty;  Surgeon: Elam Dutch, MD;  Location: [place];  Service: Vascular;  Laterality: Right;  . FINE NEEDLE ASPIRATION  N/A 09/15/2019   Procedure: FINE NEEDLE ASPIRATION (FNA) LINEAR;  Surgeon: Arta Silence, MD;  [facility];  Service: Endoscopy;  Laterality: N/A;  . GROIN DEBRIDEMENT Left 10/28/2013   Procedure: left inner thigh DEBRIDEMENT;  Surgeon: Elam Dutch, MD;  Location: [place];  Service: Vascular;  Laterality: Left;  . HAND SURGERY Left 2010  . LOWER EXTREMITY ANGIOGRAM Bilateral 12/24/2015   Procedure: Lower Extremity Angiogram;  Surgeon: Elam Dutch, MD;  Location: [place] CV LAB;  Service: Cardiovascular;   Laterality: Bilateral;  . PATCH ANGIOPLASTY Right 06/03/2018   Procedure: PATCH ANGIOPLASTY USING A XENOSURE 1CM X 14CM BIOLOGIC PATCH;  Surgeon: Elam Dutch, MD;  Location: [place];  Service: Vascular;  Laterality: Right;  . PERIPHERAL VASCULAR CATHETERIZATION N/A 12/24/2015   Procedure: Abdominal Aortogram;  Surgeon: Elam Dutch, MD;  Location: [place] CV LAB;  Service: Cardiovascular;  Laterality: N/A;  . PERIPHERAL VASCULAR CATHETERIZATION Left 12/24/2015   Procedure: Peripheral Vascular Balloon Angioplasty;  Surgeon: Elam Dutch, MD;  Location: [place] CV LAB;  Service: Cardiovascular;  Laterality: Left;  drug coated balloon  . PERIPHERAL VASCULAR CATHETERIZATION N/A 06/30/2016   Procedure: Abdominal Aortogram;  Surgeon: Elam Dutch, MD;  Location: [place] CV LAB;  Service: Cardiovascular;  Laterality: N/A;  . PERIPHERAL VASCULAR CATHETERIZATION Bilateral 06/30/2016   Procedure: Lower Extremity Angiography;  Surgeon: Elam Dutch, MD;  Location: [place] CV LAB;  Service: Cardiovascular;  Laterality: Bilateral;  . TONSILLECTOMY      Social History   Socioeconomic History  . Marital status: Single    Spouse name: Not on file  . Number of children: 1  . Years of education: Not on file  . Highest education level: Not on file  Occupational History  . 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 Smoking status: Former Smoker    Packs/day: 0.10    Types: Cigarettes    Quit date: 10/2014    Years since quitting: 5.4  . Smokeless tobacco: Never Used  . Tobacco comment: pt states that she is using the E cig only   Substance and Sexual Activity  . Alcohol use: Yes    Alcohol/week: 0.0 - 1.0 standard drinks    Comment: Occasional; 10-22 rarely   . Drug use: No  . Sexual activity: Not on file  Other Topics Concern  . Not on file  Social History Narrative   Drinks about 2 cups of coffee a day, and some tea    Social Determinants of Health    Financial Resource Strain:   . Difficulty of Paying Living Expenses:   Food Insecurity:   . Worried About Charity fundraiser in the Last Year:   . Arboriculturist in the Last Year:   Transportation Needs:   . Film/video editor (Medical):   Marland Kitchen Lack of Transportation (Non-Medical):   Physical Activity:   . Days of Exercise per Week:   . Minutes of Exercise per Session:   Stress:   . Feeling of Stress :   Social Connections:   . Frequency of Communication with Friends and Family:   . Frequency of Social Gatherings with Friends and Family:   . Attends Religious Services:   . Active Member of Clubs or Organizations:   . Attends Archivist Meetings:   Marland Kitchen Marital Status:   Intimate Partner Violence:   . Fear of Current or Ex-Partner:   . Emotionally  Abused:   Marland Kitchen Physically Abused:   . Sexually Abused:     Current Outpatient Medications on File Prior to Visit  Medication Sig Dispense Refill  . Accu-Chek Softclix Lancets lancets 1 each by Other route 3 (three) times daily. Use to monitor glucose levels TID; E11.9    . amLODipine (NORVASC) 5 MG tablet Take 5 mg by mouth daily.     Marland Kitchen aspirin EC 81 MG tablet Take 81 mg by mouth at bedtime.     . bismuth subsalicylate (PEPTO BISMOL) 262 MG/15ML suspension Take 30 mLs by mouth every 6 (six) hours as needed for indigestion or diarrhea or loose stools. Reported on 11/04/2015    . famotidine (PEPCID) 20 MG tablet Take 1 tablet (20 mg total) by mouth 2 (two) times daily. 20 tablet 0  . gabapentin (NEURONTIN) 300 MG capsule TAKE 1 CAPSULE BY MOUTH THREE TIMES A DAY (Patient taking differently: Take 300 mg by mouth 3 (three) times daily. ) 90 capsule 6  . glucose blood (ACCU-CHEK AVIVA PLUS) test strip 1 each by Other route 3 (three) times daily. 270 each 3  . lovastatin (MEVACOR) 20 MG tablet Take 20 mg by mouth every evening.     . metFORMIN (GLUCOPHAGE) 1000 MG tablet Take 1 tablet (1,000 mg total) by mouth 2 (two) times daily  with a meal. 180 tablet 3  . methocarbamol (ROBAXIN) 500 MG tablet Take 1 tablet (500 mg total) by mouth 2 (two) times daily. 20 tablet 0  . metoprolol succinate (TOPROL-XL) 100 MG 24 hr tablet Take 100 mg by mouth daily.     . Netarsudil-Latanoprost (ROCKLATAN) 0.02-0.005 % SOLN Apply to eye.    Marland Kitchen OVER THE COUNTER MEDICATION Apply 1 patch topically daily as needed (pain). Apply to right shoulder    . pantoprazole (PROTONIX) 40 MG tablet Take 40 mg by mouth daily.    . polyethylene glycol (MIRALAX / GLYCOLAX) 17 g packet Take 17 g by mouth daily as needed for moderate constipation or severe constipation. 14 each 0  . PROAIR HFA 108 (90 BASE) MCG/ACT inhaler Inhale 2 puffs into the lungs every 4 (four) hours as needed for wheezing or shortness of breath. Reported on 11/04/2015    . repaglinide (PRANDIN) 2 MG tablet Take 1 tablet (2 mg total) by mouth 2 (two) times daily before a meal. 180 tablet 3  . senna-docusate (SENOKOT-S) 8.6-50 MG tablet Take 2 tablets by mouth at bedtime as needed for mild constipation or moderate constipation. 30 tablet 1  . sucralfate (CARAFATE) 1 GM/10ML suspension Take 10 mLs (1 g total) by mouth 4 (four) times daily -  with meals and at bedtime. 420 mL 0  . temazepam (RESTORIL) 30 MG capsule Take 30 mg by mouth at bedtime as needed for sleep.  0  . traMADol (ULTRAM) 50 MG tablet Take 1 tablet (50 mg total) by mouth every 6 (six) hours as needed. 20 tablet 0   No current facility-administered medications on file prior to visit.    Allergies  Allergen Reactions  . Adhesive [Tape] Other (See Comments)    Pt states that tape and electrodes leave red "scars" on her skin and her skin is very sensitive.    Family History  Problem Relation Age of Onset  . Heart disease Father   . Heart attack Father        MI at age 18  . Hypertension Mother   . Alzheimer's disease Mother   . Hypertension Sister   .  Diabetes Brother   . Hyperlipidemia Brother   . Hypertension  Brother   . Heart disease Brother   . Heart attack Brother   . Breast cancer Maternal Aunt     BP 120/68   Pulse 83   Ht 5\' 3"  (1.6 m)   Wt 128 lb (58.1 kg)   SpO2 97%   BMI 22.67 kg/m    Review of Systems She denies hypoglycemia.      Objective:   Physical Exam VITAL SIGNS:  See vs page GENERAL: no distress Pulses: dorsalis pedis intact bilat.   MSK: no deformity of the feet CV: trace bilat leg edema Skin:  no ulcer on the feet.  normal color and temp on the feet. Old healed surgical scars on both feet.  Neuro: sensation is intact to touch on the feet.    Lab Results  Component Value Date   HGBA1C 8.5 (A) 03/30/2020    Lab Results  Component Value Date   CREATININE 0.68 09/04/2019   BUN 8 09/04/2019   NA 138 09/04/2019   K 3.8 09/04/2019   CL 104 09/04/2019   CO2 24 09/04/2019       Assessment & Plan:  Type 2 DM, with PAD: worse.  Hypoglycemia, due to repaglinide: this limits aggressiveness of glycemic control.  We'll reduce this when A1c is better. Edema: This limits rx options.  Patient Instructions  check your blood sugar three times a day.  vary the time of day when you check, between before the 3 meals, and at bedtime.  also check if you have symptoms of your blood sugar being too high or too low.  please keep a record of the readings and bring it to your next appointment here (or you can bring the meter itself).  You can write it on any piece of paper.  please call us sooner if your blood sugar goes below 70, or if you have a lot of readings over 200.   Please start taking "bromocriptine," to help your blood sugar. It has possible side effects of nausea and dizziness.  These go away with time.  You can avoid these by taking it at bedtime, and by taking just take 1/2 pill for the first week.   Please continue the same other medications  Please come back for a follow-up appointment in 2 months.

## 2020-05-13 ENCOUNTER — Other Ambulatory Visit: Payer: Self-pay | Admitting: Family Medicine

## 2020-05-13 DIAGNOSIS — E2839 Other primary ovarian failure: Secondary | ICD-10-CM

## 2020-05-13 LAB — HEMOGLOBIN A1C: Hemoglobin A1C: 8.5

## 2020-06-01 ENCOUNTER — Other Ambulatory Visit: Payer: Self-pay

## 2020-06-01 ENCOUNTER — Ambulatory Visit (INDEPENDENT_AMBULATORY_CARE_PROVIDER_SITE_OTHER): Payer: Medicare Other | Admitting: Endocrinology

## 2020-06-01 ENCOUNTER — Encounter: Payer: Self-pay | Admitting: Endocrinology

## 2020-06-01 VITALS — BP 118/68 | HR 67 | Wt 123.4 lb

## 2020-06-01 DIAGNOSIS — E1151 Type 2 diabetes mellitus with diabetic peripheral angiopathy without gangrene: Secondary | ICD-10-CM

## 2020-06-01 MED ORDER — RYBELSUS 3 MG PO TABS: 3.0000 mg | ORAL_TABLET | Freq: Every day | ORAL | 11 refills | Status: DC

## 2020-06-01 NOTE — Patient Instructions (Addendum)
check your blood sugar three times a day.  vary the time of day when you check, between before the 3 meals, and at bedtime.  also check if you have symptoms of your blood sugar being too high or too low.  please keep a record of the readings and bring it to your next appointment here (or you can bring the meter itself).  You can write it on any piece of paper.  please call us sooner if your blood sugar goes below 70, or if you have a lot of readings over 200.   I have sent a prescription to your pharmacy, to add "Rybelsus."  Please continue the same other medications.  Please come back for a follow-up appointment in 2 months.

## 2020-06-01 NOTE — Progress Notes (Signed)
Subjective:    Patient ID: Jill Higgins, female    DOB: Aug 08, 1946, 74 y.o.   MRN: [id number]  HPI Pt returns for f/u of diabetes mellitus:  DM type: 2 Dx'ed: 6063 Complications: CAD and PAD.   Therapy: 2 oral meds. GDM: never DKA: never Severe hypoglycemia: never Pancreatitis: once (2020) Pancreatic imaging: normal on 2019 CT.   SDOH: She cannot afford name brand meds. 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  Interval history:  pt states she feels well in general.  no cbg record, but states cbg's vary from 50-200's.   Past Medical History:  Diagnosis Date  . Bronchitis   . Bulging disc    in neck  . Depression with anxiety   . Diabetes mellitus    Type II  . Fibromyalgia    pt. denies  . GERD (gastroesophageal reflux disease)   . Glaucoma   . History of bronchitis   . Hyperlipidemia   . Hypertension   . Overactive bladder   . Peripheral vascular disease ([place])   . Pneumonia   . Sleep apnea    recent test negative for sleep apnea  . Tobacco abuse   . Urinary frequency     Past Surgical History:  Procedure Laterality Date  . ABDOMINAL AORTAGRAM N/A 08/15/2013   Procedure: ABDOMINAL Maxcine Ham;  Surgeon: Elam Dutch, MD;  Location: [facility] CATH LAB;  Service: Cardiovascular;  Laterality: N/A;  . ABDOMINAL AORTAGRAM N/A 06/19/2014   Procedure: ABDOMINAL Maxcine Ham;  Surgeon: Elam Dutch, MD;  Location: [facility] C[facility] LAB;  Service: Cardiovascular;  Laterality: N/A;  . ABDOMINAL AORTOGRAM W/LOWER EXTREMITY N/A 05/10/2018   Procedure: ABDOMINAL AORTOGRAM W/LOWER EXTREMITY;  Surgeon: Elam Dutch, MD;  Location: [place] CV LAB;  Service: Cardiovascular;  Laterality: N/A;  bilateral  . Aortogram w/ PTA  05/14/08,  11-04-10   Bilateral aortogram w/ bilateral  SFA PTA  stenting   . BREAST SURGERY Right    boil removal  . BUNIONECTOMY     L foot in the 1980s  . COLONOSCOPY      2014  . DILATION AND CURETTAGE OF UTERUS     X4  . ENDARTERECTOMY FEMORAL Right 06/03/2018   Procedure: RIGHT FEMORAL ENDARTERECTOMY;  Surgeon: Elam Dutch, MD;  Location: [facility] OR;  Service: Vascular;  Laterality: Right;  . Epidural shots in neck     . ESOPHAGOGASTRODUODENOSCOPY (EGD) WITH PROPOFOL N/A 09/15/2019   Procedure: ESOPHAGOGASTRODUODENOSCOPY (EGD) WITH PROPOFOL;  Surgeon: Arta Silence, MD;  Location: [facility] ENDOSCOPY;  Service: Endoscopy;  Laterality: N/A;  . EUS N/A 09/15/2019   Procedure: UPPER ENDOSCOPIC ULTRASOUND (EUS) LINEAR;  Surgeon: Arta Silence, MD;  Location: [facility] ENDOSCOPY;  Service: Endoscopy;  Laterality: N/A;  . EYE SURGERY Bilateral 2016   cataract removal  . FEMORAL-POPLITEAL BYPASS GRAFT Left 10/21/2013   Procedure: LEFT FEMORAL-POPLITEAL ARTERY BYPASS WITH SAPHENOUS VEIN GRAFT , POPLITEAL ENDARTERECTOMY ,INTRAOPERATIVE ARTERIOGRAM, vein patch angioplasty to popliteal artery;  Surgeon: Elam Dutch, MD;  Location: [place];  Service: Vascular;  Laterality: Left;  . FEMORAL-POPLITEAL BYPASS GRAFT Right 02/08/2016   Procedure: Right FEMORAL- to Above Knee POPLITEAL ARTERY Bypass Graft with reversed saphenous vein and Common Femoral Endarterectomy  with profundoplasty;  Surgeon: Elam Dutch, MD;  Location: [place];  Service: Vascular;  Laterality: Right;  . FINE NEEDLE ASPIRATION N/A 09/15/2019   Procedure: FINE NEEDLE ASPIRATION (FNA) LINEAR;  Surgeon: Arta Silence, MD;  [facility];  Service: Endoscopy;  Laterality: N/A;  . GROIN DEBRIDEMENT Left 10/28/2013   Procedure: left inner thigh DEBRIDEMENT;  Surgeon: Elam Dutch, MD;  Location: [place];  Service: Vascular;  Laterality: Left;  . HAND SURGERY Left 2010  . LOWER EXTREMITY ANGIOGRAM Bilateral 12/24/2015   Procedure: Lower Extremity Angiogram;  Surgeon: Elam Dutch, MD;  Location: [place] CV LAB;  Service: Cardiovascular;  Laterality: Bilateral;  . PATCH ANGIOPLASTY Right 06/03/2018    Procedure: PATCH ANGIOPLASTY USING A XENOSURE 1CM X 14CM BIOLOGIC PATCH;  Surgeon: Elam Dutch, MD;  Location: [place];  Service: Vascular;  Laterality: Right;  . PERIPHERAL VASCULAR CATHETERIZATION N/A 12/24/2015   Procedure: Abdominal Aortogram;  Surgeon: Elam Dutch, MD;  Location: [place] CV LAB;  Service: Cardiovascular;  Laterality: N/A;  . PERIPHERAL VASCULAR CATHETERIZATION Left 12/24/2015   Procedure: Peripheral Vascular Balloon Angioplasty;  Surgeon: Elam Dutch, MD;  Location: [place] CV LAB;  Service: Cardiovascular;  Laterality: Left;  drug coated balloon  . PERIPHERAL VASCULAR CATHETERIZATION N/A 06/30/2016   Procedure: Abdominal Aortogram;  Surgeon: Elam Dutch, MD;  Location: [place] CV LAB;  Service: Cardiovascular;  Laterality: N/A;  . PERIPHERAL VASCULAR CATHETERIZATION Bilateral 06/30/2016   Procedure: Lower Extremity Angiography;  Surgeon: Elam Dutch, MD;  Location: [place] CV LAB;  Service: Cardiovascular;  Laterality: Bilateral;  . TONSILLECTOMY      Social History   Socioeconomic History  . Marital status: Single    Spouse name: Not on file  . Number of children: 1  . Years of education: Not on file  . Highest education level: Not on file  Occupational History  . 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 Smoking status: Former Smoker    Packs/day: 0.10    Types: Cigarettes    Quit date: 10/2014    Years since quitting: 5.6  . Smokeless tobacco: Never Used  . Tobacco comment: pt states that she is using the E cig only   Vaping Use  . Vaping Use: Some days  Substance and Sexual Activity  . Alcohol use: Yes    Alcohol/week: 0.0 - 1.0 standard drinks    Comment: Occasional; 10-22 rarely   . Drug use: No  . Sexual activity: Not on file  Other Topics Concern  . Not on file  Social History Narrative   Drinks about 2 cups of coffee a day, and some tea    Social Determinants of Health   Financial Resource  Strain:   . Difficulty of Paying Living Expenses:   Food Insecurity:   . Worried About Charity fundraiser in the Last Year:   . Arboriculturist in the Last Year:   Transportation Needs:   . Film/video editor (Medical):   Marland Kitchen Lack of Transportation (Non-Medical):   Physical Activity:   . Days of Exercise per Week:   . Minutes of Exercise per Session:   Stress:   . Feeling of Stress :   Social Connections:   . Frequency of Communication with Friends and Family:   . Frequency of Social Gatherings with Friends and Family:   . Attends Religious Services:   . Active Member of Clubs or Organizations:   . Attends Archivist Meetings:   Marland Kitchen Marital Status:   Intimate Partner Violence:   . Fear of Current or Ex-Partner:   . Emotionally Abused:   Marland Kitchen Physically Abused:   .  Sexually Abused:     Current Outpatient Medications on File Prior to Visit  Medication Sig Dispense Refill  . Accu-Chek Softclix Lancets lancets 1 each by Other route 3 (three) times daily. Use to monitor glucose levels TID; E11.9    . amLODipine (NORVASC) 5 MG tablet Take 5 mg by mouth daily.     Marland Kitchen aspirin EC 81 MG tablet Take 81 mg by mouth at bedtime.     . bismuth subsalicylate (PEPTO BISMOL) 262 MG/15ML suspension Take 30 mLs by mouth every 6 (six) hours as needed for indigestion or diarrhea or loose stools. Reported on 11/04/2015    . bromocriptine (PARLODEL) 2.5 MG tablet Take 1 tablet (2.5 mg total) by mouth at bedtime. 30 tablet 11  . famotidine (PEPCID) 20 MG tablet Take 1 tablet (20 mg total) by mouth 2 (two) times daily. 20 tablet 0  . gabapentin (NEURONTIN) 300 MG capsule TAKE 1 CAPSULE BY MOUTH THREE TIMES A DAY (Patient taking differently: Take 300 mg by mouth 3 (three) times daily. ) 90 capsule 6  . glucose blood (ACCU-CHEK AVIVA PLUS) test strip 1 each by Other route 3 (three) times daily. 270 each 3  . lovastatin (MEVACOR) 20 MG tablet Take 20 mg by mouth every evening.     . metFORMIN  (GLUCOPHAGE) 1000 MG tablet Take 1 tablet (1,000 mg total) by mouth 2 (two) times daily with a meal. 180 tablet 3  . methocarbamol (ROBAXIN) 500 MG tablet Take 1 tablet (500 mg total) by mouth 2 (two) times daily. 20 tablet 0  . metoprolol succinate (TOPROL-XL) 100 MG 24 hr tablet Take 100 mg by mouth daily.     . Netarsudil-Latanoprost (ROCKLATAN) 0.02-0.005 % SOLN Apply to eye.    Marland Kitchen OVER THE COUNTER MEDICATION Apply 1 patch topically daily as needed (pain). Apply to right shoulder    . pantoprazole (PROTONIX) 40 MG tablet Take 40 mg by mouth daily.    . polyethylene glycol (MIRALAX / GLYCOLAX) 17 g packet Take 17 g by mouth daily as needed for moderate constipation or severe constipation. 14 each 0  . PROAIR HFA 108 (90 BASE) MCG/ACT inhaler Inhale 2 puffs into the lungs every 4 (four) hours as needed for wheezing or shortness of breath. Reported on 11/04/2015    . repaglinide (PRANDIN) 2 MG tablet Take 1 tablet (2 mg total) by mouth 2 (two) times daily before a meal. 180 tablet 3  . senna-docusate (SENOKOT-S) 8.6-50 MG tablet Take 2 tablets by mouth at bedtime as needed for mild constipation or moderate constipation. 30 tablet 1  . sucralfate (CARAFATE) 1 GM/10ML suspension Take 10 mLs (1 g total) by mouth 4 (four) times daily -  with meals and at bedtime. 420 mL 0  . temazepam (RESTORIL) 30 MG capsule Take 30 mg by mouth at bedtime as needed for sleep.  0  . traMADol (ULTRAM) 50 MG tablet Take 1 tablet (50 mg total) by mouth every 6 (six) hours as needed. 20 tablet 0   No current facility-administered medications on file prior to visit.    Allergies  Allergen Reactions  . Adhesive [Tape] Other (See Comments)    Pt states that tape and electrodes leave red "scars" on her skin and her skin is very sensitive.    Family History  Problem Relation Age of Onset  . Heart disease Father   . Heart attack Father        MI at age 56  . Hypertension Mother   .  Alzheimer's disease Mother   .  Hypertension Sister   . Diabetes Brother   . Hyperlipidemia Brother   . Hypertension Brother   . Heart disease Brother   . Heart attack Brother   . Breast cancer Maternal Aunt     BP 118/68 (BP Location: Right Arm, Patient Position: Sitting, Cuff Size: Normal)   Pulse 67   Wt 123 lb 6.4 oz (56 kg)   SpO2 97%   BMI 21.86 kg/m    Review of Systems She denies nausea.        Objective:   Physical Exam VITAL SIGNS:  See vs page GENERAL: no distress Pulses: dorsalis pedis intact bilat.   MSK: no deformity of the feet CV: trace bilat leg edema Skin:  no ulcer on the feet.  normal color and temp on the feet. Neuro: sensation is intact to touch on the feet.   Lab Results  Component Value Date   HGBA1C 8.5 05/13/2020        Assessment & Plan:  Type 2 DM, with PAD: she needs increased rx Hypoglycemia, due to insulin: this limits aggressiveness of glycemic control.    Patient Instructions  check your blood sugar three times a day.  vary the time of day when you check, between before the 3 meals, and at bedtime.  also check if you have symptoms of your blood sugar being too high or too low.  please keep a record of the readings and bring it to your next appointment here (or you can bring the meter itself).  You can write it on any piece of paper.  please call us sooner if your blood sugar goes below 70, or if you have a lot of readings over 200.   I have sent a prescription to your pharmacy, to add "Rybelsus."  Please continue the same other medications.  Please come back for a follow-up appointment in 2 months.

## 2020-06-30 ENCOUNTER — Other Ambulatory Visit: Payer: Self-pay

## 2020-06-30 DIAGNOSIS — I739 Peripheral vascular disease, unspecified: Secondary | ICD-10-CM

## 2020-07-15 ENCOUNTER — Ambulatory Visit: Payer: Medicare Other | Admitting: Vascular Surgery

## 2020-07-15 ENCOUNTER — Encounter (HOSPITAL_COMMUNITY): Payer: Medicare Other

## 2020-07-15 ENCOUNTER — Other Ambulatory Visit (HOSPITAL_COMMUNITY): Payer: Medicare Other

## 2020-08-03 ENCOUNTER — Ambulatory Visit
Admission: RE | Admit: 2020-08-03 | Discharge: 2020-08-03 | Disposition: A | Discharge status: Home / Self Care | Payer: Medicare Other | Source: Ambulatory Visit | Attending: Family Medicine | Admitting: Family Medicine

## 2020-08-03 ENCOUNTER — Other Ambulatory Visit: Payer: Self-pay

## 2020-08-03 ENCOUNTER — Other Ambulatory Visit: Payer: Self-pay | Admitting: Family Medicine

## 2020-08-03 DIAGNOSIS — Z1231 Encounter for screening mammogram for malignant neoplasm of breast: Secondary | ICD-10-CM

## 2020-08-03 DIAGNOSIS — E2839 Other primary ovarian failure: Secondary | ICD-10-CM

## 2020-08-10 ENCOUNTER — Encounter: Payer: Self-pay | Admitting: Endocrinology

## 2020-08-10 ENCOUNTER — Other Ambulatory Visit: Payer: Self-pay

## 2020-08-10 ENCOUNTER — Ambulatory Visit (INDEPENDENT_AMBULATORY_CARE_PROVIDER_SITE_OTHER): Payer: Medicare Other | Admitting: Endocrinology

## 2020-08-10 VITALS — BP 110/70 | HR 67 | Ht 63.0 in | Wt 121.8 lb

## 2020-08-10 DIAGNOSIS — E1151 Type 2 diabetes mellitus with diabetic peripheral angiopathy without gangrene: Secondary | ICD-10-CM

## 2020-08-10 LAB — POCT GLYCOSYLATED HEMOGLOBIN (HGB A1C): Hemoglobin A1C: 8.3 % — AB (ref 4.0–5.6)

## 2020-08-10 MED ORDER — ACCU-CHEK AVIVA PLUS VI STRP: 1.0000 | ORAL_STRIP | Freq: Four times a day (QID) | 3 refills | Status: DC

## 2020-08-10 MED ORDER — BROMOCRIPTINE MESYLATE 2.5 MG PO TABS: 2.5000 mg | ORAL_TABLET | Freq: Every day | ORAL | 3 refills | Status: DC

## 2020-08-10 MED ORDER — REPAGLINIDE 2 MG PO TABS: 4.0000 mg | ORAL_TABLET | Freq: Two times a day (BID) | ORAL | 3 refills | Status: DC

## 2020-08-10 NOTE — Patient Instructions (Addendum)
check your blood sugar 4 times a day.  vary the time of day when you check, between before the 3 meals, and at bedtime.  also check if you have symptoms of your blood sugar being too high or too low.  please keep a record of the readings and bring it to your next appointment here (or you can bring the meter itself).  You can write it on any piece of paper.  please call us sooner if your blood sugar goes below 70, or if you have a lot of readings over 200.   I have sent a prescription to your pharmacy, to increase the repaglinide.   Please continue the same other medications.  Please come back for a follow-up appointment in 2 months.

## 2020-08-10 NOTE — Progress Notes (Signed)
Subjective:    Patient ID: Jill Higgins, female    DOB: 07/01/1946, 74 y.o.   MRN: [id number]  HPI Pt returns for f/u of diabetes mellitus:  DM type: 2 Dx'ed: 5573 Complications: CAD and PAD.   Therapy: 3 oral meds. GDM: never DKA: never Severe hypoglycemia: never Pancreatitis: once (2020) Pancreatic imaging: normal on 2019 CT.   SDOH: She cannot afford name brand meds. 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  Interval history:  pt states she feels well in general.  no cbg record, but states cbg's vary from 114-340.   She wants to increase cbg's to QID, due to variable cbg's.  Past Medical History:  Diagnosis Date  . Bronchitis   . Bulging disc    in neck  . Depression with anxiety   . Diabetes mellitus    Type II  . Fibromyalgia    pt. denies  . GERD (gastroesophageal reflux disease)   . Glaucoma   . History of bronchitis   . Hyperlipidemia   . Hypertension   . Overactive bladder   . Peripheral vascular disease ([place])   . Pneumonia   . Sleep apnea    recent test negative for sleep apnea  . Tobacco abuse   . Urinary frequency     Past Surgical History:  Procedure Laterality Date  . ABDOMINAL AORTAGRAM N/A 08/15/2013   Procedure: ABDOMINAL Maxcine Ham;  Surgeon: Elam Dutch, MD;  Location: [facility] CATH LAB;  Service: Cardiovascular;  Laterality: N/A;  . ABDOMINAL AORTAGRAM N/A 06/19/2014   Procedure: ABDOMINAL Maxcine Ham;  Surgeon: Elam Dutch, MD;  Location: [facility] CATH LAB;  Service: Cardiovascular;  Laterality: N/A;  . ABDOMINAL AORTOGRAM W/LOWER EXTREMITY N/A 05/10/2018   Procedure: ABDOMINAL AORTOGRAM W/LOWER EXTREMITY;  Surgeon: Elam Dutch, MD;  Location: [place] CV LAB;  Service: Cardiovascular;  Laterality: N/A;  bilateral  . Aortogram w/ PTA  05/14/08,  11-04-10   Bilateral aortogram w/ bilateral  SFA PTA  stenting   . BREAST SURGERY Right    boil removal  .  BUNIONECTOMY     L foot in the 1980s  . COLONOSCOPY     2014  . DILATION AND CURETTAGE OF UTERUS     X4  . ENDARTERECTOMY FEMORAL Right 06/03/2018   Procedure: RIGHT FEMORAL ENDARTERECTOMY;  Surgeon: Elam Dutch, MD;  Location: [facility] OR;  Service: Vascular;  Laterality: Right;  . Epidural shots in neck     . ESOPHAGOGASTRODUODENOSCOPY (EGD) WITH PROPOFOL N/A 09/15/2019   Procedure: ESOPHAGOGASTRODUODENOSCOPY (EGD) WITH PROPOFOL;  Surgeon: Arta Silence, MD;  [facility];  Service: Endoscopy;  Laterality: N/A;  . EUS N/A 09/15/2019   Procedure: UPPER ENDOSCOPIC ULTRASOUND (EUS) LINEAR;  Surgeon: Arta Silence, MD;  Location: [facility];  Service: Endoscopy;  Laterality: N/A;  . EYE SURGERY Bilateral 2016   cataract removal  . FEMORAL-POPLITEAL BYPASS GRAFT Left 10/21/2013   Procedure: LEFT FEMORAL-POPLITEAL ARTERY BYPASS WITH SAPHENOUS VEIN GRAFT , POPLITEAL ENDARTERECTOMY ,INTRAOPERATIVE ARTERIOGRAM, vein patch angioplasty to popliteal artery;  Surgeon: Elam Dutch, MD;  Location: [place];  Service: Vascular;  Laterality: Left;  . FEMORAL-POPLITEAL BYPASS GRAFT Right 02/08/2016   Procedure: Right FEMORAL- to Above Knee POPLITEAL ARTERY Bypass Graft with reversed saphenous vein and Common Femoral Endarterectomy  with profundoplasty;  Surgeon: Elam Dutch, MD;  Location: [place];  Service: Vascular;  Laterality: Right;  . FINE NEEDLE ASPIRATION N/A 09/15/2019  Procedure: FINE NEEDLE ASPIRATION (FNA) LINEAR;  Surgeon: Arta Silence, MD;  [facility];  Service: Endoscopy;  Laterality: N/A;  . GROIN DEBRIDEMENT Left 10/28/2013   Procedure: left inner thigh DEBRIDEMENT;  Surgeon: Elam Dutch, MD;  Location: [place];  Service: Vascular;  Laterality: Left;  . HAND SURGERY Left 2010  . LOWER EXTREMITY ANGIOGRAM Bilateral 12/24/2015   Procedure: Lower Extremity Angiogram;  Surgeon: Elam Dutch, MD;  Location: [place] CV LAB;  Service: Cardiovascular;   Laterality: Bilateral;  . PATCH ANGIOPLASTY Right 06/03/2018   Procedure: PATCH ANGIOPLASTY USING A XENOSURE 1CM X 14CM BIOLOGIC PATCH;  Surgeon: Elam Dutch, MD;  Location: [place];  Service: Vascular;  Laterality: Right;  . PERIPHERAL VASCULAR CATHETERIZATION N/A 12/24/2015   Procedure: Abdominal Aortogram;  Surgeon: Elam Dutch, MD;  Location: [place] CV LAB;  Service: Cardiovascular;  Laterality: N/A;  . PERIPHERAL VASCULAR CATHETERIZATION Left 12/24/2015   Procedure: Peripheral Vascular Balloon Angioplasty;  Surgeon: Elam Dutch, MD;  Location: [place] CV LAB;  Service: Cardiovascular;  Laterality: Left;  drug coated balloon  . PERIPHERAL VASCULAR CATHETERIZATION N/A 06/30/2016   Procedure: Abdominal Aortogram;  Surgeon: Elam Dutch, MD;  Location: [place] CV LAB;  Service: Cardiovascular;  Laterality: N/A;  . PERIPHERAL VASCULAR CATHETERIZATION Bilateral 06/30/2016   Procedure: Lower Extremity Angiography;  Surgeon: Elam Dutch, MD;  Location: [place] CV LAB;  Service: Cardiovascular;  Laterality: Bilateral;  . TONSILLECTOMY      Social History   Socioeconomic History  . Marital status: Single    Spouse name: Not on file  . Number of children: 1  . Years of education: Not on file  . Highest education level: Not on file  Occupational History  . 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 Smoking status: Former Smoker    Packs/day: 0.10    Types: Cigarettes    Quit date: 10/2014    Years since quitting: 5.8  . Smokeless tobacco: Never Used  . Tobacco comment: pt states that she is using the E cig only   Vaping Use  . Vaping Use: Some days  Substance and Sexual Activity  . Alcohol use: Yes    Alcohol/week: 0.0 - 1.0 standard drinks    Comment: Occasional; 10-22 rarely   . Drug use: No  . Sexual activity: Not on file  Other Topics Concern  . Not on file  Social History Narrative   Drinks about 2 cups of coffee a day, and some  tea    Social Determinants of Health   Financial Resource Strain:   . Difficulty of Paying Living Expenses: Not on file  Food Insecurity:   . Worried About Charity fundraiser in the Last Year: Not on file  . Ran Out of Food in the Last Year: Not on file  Transportation Needs:   . Lack of Transportation (Medical): Not on file  . Lack of Transportation (Non-Medical): Not on file  Physical Activity:   . Days of Exercise per Week: Not on file  . Minutes of Exercise per Session: Not on file  Stress:   . Feeling of Stress : Not on file  Social Connections:   . Frequency of Communication with Friends and Family: Not on file  . Frequency of Social Gatherings with Friends and Family: Not on file  . Attends Religious Services: Not on file  . Active Member of Clubs or Organizations: Not on file  .  Attends Archivist Meetings: Not on file  . Marital Status: Not on file  Intimate Partner Violence:   . Fear of Current or Ex-Partner: Not on file  . Emotionally Abused: Not on file  . Physically Abused: Not on file  . Sexually Abused: Not on file    Current Outpatient Medications on File Prior to Visit  Medication Sig Dispense Refill  . Accu-Chek Softclix Lancets lancets 1 each by Other route 3 (three) times daily. Use to monitor glucose levels TID; E11.9    . amLODipine (NORVASC) 5 MG tablet Take 5 mg by mouth daily.     Marland Kitchen aspirin EC 81 MG tablet Take 81 mg by mouth at bedtime.     . bismuth subsalicylate (PEPTO BISMOL) 262 MG/15ML suspension Take 30 mLs by mouth every 6 (six) hours as needed for indigestion or diarrhea or loose stools. Reported on 11/04/2015    . famotidine (PEPCID) 20 MG tablet Take 1 tablet (20 mg total) by mouth 2 (two) times daily. 20 tablet 0  . gabapentin (NEURONTIN) 300 MG capsule TAKE 1 CAPSULE BY MOUTH THREE TIMES A DAY (Patient taking differently: Take 300 mg by mouth 3 (three) times daily. ) 90 capsule 6  . lovastatin (MEVACOR) 20 MG tablet Take 20 mg by  mouth every evening.     . metFORMIN (GLUCOPHAGE) 1000 MG tablet Take 1 tablet (1,000 mg total) by mouth 2 (two) times daily with a meal. 180 tablet 3  . methocarbamol (ROBAXIN) 500 MG tablet Take 1 tablet (500 mg total) by mouth 2 (two) times daily. 20 tablet 0  . metoprolol succinate (TOPROL-XL) 100 MG 24 hr tablet Take 100 mg by mouth daily.     . Netarsudil-Latanoprost (ROCKLATAN) 0.02-0.005 % SOLN Apply to eye.    Marland Kitchen OVER THE COUNTER MEDICATION Apply 1 patch topically daily as needed (pain). Apply to right shoulder    . pantoprazole (PROTONIX) 40 MG tablet Take 40 mg by mouth daily.    . polyethylene glycol (MIRALAX / GLYCOLAX) 17 g packet Take 17 g by mouth daily as needed for moderate constipation or severe constipation. 14 each 0  . PROAIR HFA 108 (90 BASE) MCG/ACT inhaler Inhale 2 puffs into the lungs every 4 (four) hours as needed for wheezing or shortness of breath. Reported on 11/04/2015    . senna-docusate (SENOKOT-S) 8.6-50 MG tablet Take 2 tablets by mouth at bedtime as needed for mild constipation or moderate constipation. 30 tablet 1  . sucralfate (CARAFATE) 1 GM/10ML suspension Take 10 mLs (1 g total) by mouth 4 (four) times daily -  with meals and at bedtime. 420 mL 0  . temazepam (RESTORIL) 30 MG capsule Take 30 mg by mouth at bedtime as needed for sleep.  0  . traMADol (ULTRAM) 50 MG tablet Take 1 tablet (50 mg total) by mouth every 6 (six) hours as needed. 20 tablet 0   No current facility-administered medications on file prior to visit.    Allergies  Allergen Reactions  . Adhesive [Tape] Other (See Comments)    Pt states that tape and electrodes leave red "scars" on her skin and her skin is very sensitive.    Family History  Problem Relation Age of Onset  . Heart disease Father   . Heart attack Father        MI at age 60  . Hypertension Mother   . Alzheimer's disease Mother   . Hypertension Sister   . Diabetes Brother   .  Hyperlipidemia Brother   . Hypertension  Brother   . Heart disease Brother   . Heart attack Brother   . Breast cancer Maternal Aunt     BP 110/70   Pulse 67   Ht 5\' 3"  (1.6 m)   Wt 121 lb 12.8 oz (55.2 kg)   SpO2 90%   BMI 21.58 kg/m    Review of Systems Denies nausea and dizziness.      Objective:   Physical Exam VITAL SIGNS:  See vs page GENERAL: no distress Pulses: dorsalis pedis intact bilat.   MSK: no deformity of the feet CV: trace bilat leg edema Skin:  no ulcer on the feet.  normal color and temp on the feet. Neuro: sensation is intact to touch on the feet  Lab Results  Component Value Date   HGBA1C 8.3 (A) 08/10/2020   Lab Results  Component Value Date   CREATININE 0.68 09/04/2019   BUN 8 09/04/2019   NA 138 09/04/2019   K 3.8 09/04/2019   CL 104 09/04/2019   CO2 24 09/04/2019       Assessment & Plan:  Type 2 DM, with PAD: uncontrolled.    Patient Instructions  check your blood sugar 4 times a day.  vary the time of day when you check, between before the 3 meals, and at bedtime.  also check if you have symptoms of your blood sugar being too high or too low.  please keep a record of the readings and bring it to your next appointment here (or you can bring the meter itself).  You can write it on any piece of paper.  please call us sooner if your blood sugar goes below 70, or if you have a lot of readings over 200.   I have sent a prescription to your pharmacy, to increase the repaglinide.   Please continue the same other medications.  Please come back for a follow-up appointment in 2 months.

## 2020-08-19 ENCOUNTER — Ambulatory Visit (INDEPENDENT_AMBULATORY_CARE_PROVIDER_SITE_OTHER): Payer: Medicare Other | Admitting: Vascular Surgery

## 2020-08-19 ENCOUNTER — Encounter: Payer: Self-pay | Admitting: Vascular Surgery

## 2020-08-19 ENCOUNTER — Ambulatory Visit (INDEPENDENT_AMBULATORY_CARE_PROVIDER_SITE_OTHER)
Admission: RE | Admit: 2020-08-19 | Discharge: 2020-08-19 | Disposition: A | Discharge status: Home / Self Care | Payer: Medicare Other | Source: Ambulatory Visit | Attending: Vascular Surgery | Admitting: Vascular Surgery

## 2020-08-19 ENCOUNTER — Ambulatory Visit (HOSPITAL_COMMUNITY)
Admission: RE | Admit: 2020-08-19 | Discharge: 2020-08-19 | Disposition: A | Discharge status: Home / Self Care | Payer: Medicare Other | Source: Ambulatory Visit | Attending: Vascular Surgery | Admitting: Vascular Surgery

## 2020-08-19 ENCOUNTER — Other Ambulatory Visit: Payer: Self-pay

## 2020-08-19 VITALS — BP 137/87 | HR 62 | Temp 97.4°F | Resp 20 | Ht 63.0 in | Wt 118.0 lb

## 2020-08-19 DIAGNOSIS — I739 Peripheral vascular disease, unspecified: Secondary | ICD-10-CM

## 2020-08-19 NOTE — Progress Notes (Signed)
Patient is a 74 year old female who returns for follow-up today.  She previously underwent right femoral endarterectomy and right femoral-popliteal bypass graft in 2014.  She also had a left femoral-popliteal bypass graft in 2017 and angioplasty of the distal anastomosis in 2017.  She underwent a Whipple procedure at [facility] for neuroendocrine tumor in December 2020.  She currently reports no claudication symptoms.  Review of systems: She has no shortness of breath.  She has no chest pain.  She did lose about 20 to 30 pounds after her Whipple procedure.  She states her appetite is returning.  Physical exam:  Vitals:   08/19/20 1501  BP: 137/87  Pulse: 62  Resp: 20  Temp: (!) 97.4 F (36.3 C)  SpO2: 98%  Weight: 118 lb (53.5 kg)  Height: 5\' 3"  (1.6 m)    Abdomen: Soft nontender nondistended  Extremities: 2+ dorsalis pedis pulses bilaterally   Data: Patient had bilateral ABIs performed today.  Right side with 1.04, left side was 0.99 she also had a duplex ultrasound of both lower extremity bypass graft.  The right graft was widely patent.  There was slightly increased velocities in the left proximal anastomosis of 294 cm/s suggestive of a 50 to 70% stenosis.  Assessment: Possible recurrent stenosis left proximal anastomosis of left femoropopliteal bypass.  Widely patent right femoropopliteal bypass.  Currently no claudication symptoms or drop in ABIs.  Plan: Patient will follow up in 3 months time for repeat duplex and ABIs.  If the narrowing is worse or velocities increased on the left side we would consider an arteriogram at that point.  Would also consider arteriogram if she develops recurrent claudication symptoms or drop in ABI.  Ruta Hinds, MD [facility] [place] Office: [phone]

## 2020-08-24 ENCOUNTER — Other Ambulatory Visit: Payer: Self-pay | Admitting: *Deleted

## 2020-08-24 DIAGNOSIS — Z95828 Presence of other vascular implants and grafts: Secondary | ICD-10-CM

## 2020-08-24 DIAGNOSIS — I739 Peripheral vascular disease, unspecified: Secondary | ICD-10-CM

## 2020-10-11 ENCOUNTER — Ambulatory Visit: Payer: Medicare Other

## 2020-10-11 ENCOUNTER — Ambulatory Visit: Payer: Medicare Other | Admitting: Endocrinology

## 2020-10-11 DIAGNOSIS — E1151 Type 2 diabetes mellitus with diabetic peripheral angiopathy without gangrene: Secondary | ICD-10-CM

## 2020-11-02 ENCOUNTER — Encounter: Payer: Self-pay | Admitting: Endocrinology

## 2020-11-02 ENCOUNTER — Encounter: Payer: Medicare Other | Attending: Endocrinology | Admitting: Nutrition

## 2020-11-02 ENCOUNTER — Other Ambulatory Visit: Payer: Self-pay

## 2020-11-02 ENCOUNTER — Ambulatory Visit: Payer: Medicare Other | Admitting: Endocrinology

## 2020-11-02 VITALS — BP 104/86 | HR 62 | Ht 63.0 in | Wt 117.0 lb

## 2020-11-02 DIAGNOSIS — E1151 Type 2 diabetes mellitus with diabetic peripheral angiopathy without gangrene: Secondary | ICD-10-CM | POA: Diagnosis not present

## 2020-11-02 DIAGNOSIS — E1159 Type 2 diabetes mellitus with other circulatory complications: Secondary | ICD-10-CM | POA: Diagnosis not present

## 2020-11-02 LAB — POCT GLYCOSYLATED HEMOGLOBIN (HGB A1C): Hemoglobin A1C: 9.5 % — AB (ref 4.0–5.6)

## 2020-11-02 MED ORDER — INSULIN NPH (HUMAN) (ISOPHANE) 100 UNIT/ML ~~LOC~~ SUSP: 6.0000 [IU] | SUBCUTANEOUS | 11 refills | Status: DC

## 2020-11-02 NOTE — Progress Notes (Signed)
Patient was trained on how to use the insulin pen.  We discussed how to store the pens, how to attach a needle, dial the dose, where and when to inject, how to remove the needle and where to discard it.  She reported good understanding of this.  We also discussed low blood sugars--symptoms and treatment.  She reported good understanding of this with no questions. We also discussed her diet.  She is drinking sweet tea and lemonade, and coffee with 3-4 tsp. Of sugar.  She was told to stop the sweet drinks and other drink options were given to her.  She is drinking Boost as well as a "snack for energy".  She was told that this is a meal replacement, and should only be used when she can not eat a meal.  She had no final questions.

## 2020-11-02 NOTE — Progress Notes (Signed)
Subjective:    Patient ID: Jill Higgins, female    DOB: 1946/03/11, 74 y.o.   MRN: [id number]  HPI Pt returns for f/u of diabetes mellitus:  DM type: 2 Dx'ed: 0177 Complications: CAD and PAD.   Therapy: 3 oral meds. GDM: never DKA: never Severe hypoglycemia: never Pancreatitis: once (2020) Pancreatic imaging: normal on 2019 CT.   SDOH: She cannot afford name brand meds. 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   Interval history: She had steroid injection last week, into the left shoulder.  Since then, cbg increased to the 300's.  However, cbg's were approx 200, even prior to the injection.  Main symptom is fatigue.   Past Medical History:  Diagnosis Date  . Bronchitis   . Bulging disc    in neck  . Depression with anxiety   . Diabetes mellitus    Type II  . Fibromyalgia    pt. denies  . GERD (gastroesophageal reflux disease)   . Glaucoma   . History of bronchitis   . Hyperlipidemia   . Hypertension   . Overactive bladder   . Peripheral vascular disease ([place])   . Pneumonia   . Sleep apnea    recent test negative for sleep apnea  . Tobacco abuse   . Urinary frequency     Past Surgical History:  Procedure Laterality Date  . ABDOMINAL AORTAGRAM N/A 08/15/2013   Procedure: ABDOMINAL Maxcine Ham;  Surgeon: Elam Dutch, MD;  Location: [facility] CATH LAB;  Service: Cardiovascular;  Laterality: N/A;  . ABDOMINAL AORTAGRAM N/A 06/19/2014   Procedure: ABDOMINAL Maxcine Ham;  Surgeon: Elam Dutch, MD;  Location: [facility] CATH LAB;  Service: Cardiovascular;  Laterality: N/A;  . ABDOMINAL AORTOGRAM W/LOWER EXTREMITY N/A 05/10/2018   Procedure: ABDOMINAL AORTOGRAM W/LOWER EXTREMITY;  Surgeon: Elam Dutch, MD;  Location: [place] CV LAB;  Service: Cardiovascular;  Laterality: N/A;  bilateral  . Aortogram w/ PTA  05/14/08,  11-04-10   Bilateral aortogram w/ bilateral  SFA PTA  stenting   .  BREAST SURGERY Right    boil removal  . BUNIONECTOMY     L foot in the 1980s  . COLONOSCOPY     2014  . DILATION AND CURETTAGE OF UTERUS     X4  . ENDARTERECTOMY FEMORAL Right 06/03/2018   Procedure: RIGHT FEMORAL ENDARTERECTOMY;  Surgeon: Elam Dutch, MD;  [facility];  Service: Vascular;  Laterality: Right;  . Epidural shots in neck     . ESOPHAGOGASTRODUODENOSCOPY (EGD) WITH PROPOFOL N/A 09/15/2019   Procedure: ESOPHAGOGASTRODUODENOSCOPY (EGD) WITH PROPOFOL;  Surgeon: Arta Silence, MD;  Location: [facility] ENDOSCOPY;  Service: Endoscopy;  Laterality: N/A;  . EUS N/A 09/15/2019   Procedure: UPPER ENDOSCOPIC ULTRASOUND (EUS) LINEAR;  Surgeon: Arta Silence, MD;  Location: [facility] ENDOSCOPY;  Service: Endoscopy;  Laterality: N/A;  . EYE SURGERY Bilateral 2016   cataract removal  . FEMORAL-POPLITEAL BYPASS GRAFT Left 10/21/2013   Procedure: LEFT FEMORAL-POPLITEAL ARTERY BYPASS WITH SAPHENOUS VEIN GRAFT , POPLITEAL ENDARTERECTOMY ,INTRAOPERATIVE ARTERIOGRAM, vein patch angioplasty to popliteal artery;  Surgeon: Elam Dutch, MD;  Location: [place];  Service: Vascular;  Laterality: Left;  . FEMORAL-POPLITEAL BYPASS GRAFT Right 02/08/2016   Procedure: Right FEMORAL- to Above Knee POPLITEAL ARTERY Bypass Graft with reversed saphenous vein and Common Femoral Endarterectomy  with profundoplasty;  Surgeon: Elam Dutch, MD;  Location: [place];  Service: Vascular;  Laterality: Right;  . FINE NEEDLE  ASPIRATION N/A 09/15/2019   Procedure: FINE NEEDLE ASPIRATION (FNA) LINEAR;  Surgeon: Arta Silence, MD;  [facility]OS[facility];  Service: Endoscopy;  Laterality: N/A;  . GROIN DEBRIDEMENT Left 10/28/2013   Procedure: left inner thigh DEBRIDEMENT;  Surgeon: Elam Dutch, MD;  Location: [place];  Service: Vascular;  Laterality: Left;  . HAND SURGERY Left 2010  . LOWER EXTREMITY ANGIOGRAM Bilateral 12/24/2015   Procedure: Lower Extremity Angiogram;  Surgeon: Elam Dutch, MD;  Location: [place] CV LAB;  Service: Cardiovascular;  Laterality: Bilateral;  . PATCH ANGIOPLASTY Right 06/03/2018   Procedure: PATCH ANGIOPLASTY USING A XENOSURE 1CM X 14CM BIOLOGIC PATCH;  Surgeon: Elam Dutch, MD;  Location: [place];  Service: Vascular;  Laterality: Right;  . PERIPHERAL VASCULAR CATHETERIZATION N/A 12/24/2015   Procedure: Abdominal Aortogram;  Surgeon: Elam Dutch, MD;  Location: [place] CV LAB;  Service: Cardiovascular;  Laterality: N/A;  . PERIPHERAL VASCULAR CATHETERIZATION Left 12/24/2015   Procedure: Peripheral Vascular Balloon Angioplasty;  Surgeon: Elam Dutch, MD;  Location: [place] CV LAB;  Service: Cardiovascular;  Laterality: Left;  drug coated balloon  . PERIPHERAL VASCULAR CATHETERIZATION N/A 06/30/2016   Procedure: Abdominal Aortogram;  Surgeon: Elam Dutch, MD;  Location: [place] CV LAB;  Service: Cardiovascular;  Laterality: N/A;  . PERIPHERAL VASCULAR CATHETERIZATION Bilateral 06/30/2016   Procedure: Lower Extremity Angiography;  Surgeon: Elam Dutch, MD;  Location: [place] CV LAB;  Service: Cardiovascular;  Laterality: Bilateral;  . TONSILLECTOMY      Social History   Socioeconomic History  . Marital status: Single    Spouse name: Not on file  . Number of children: 1  . Years of education: Not on file  . Highest education level: Not on file  Occupational History  . 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 Smoking status: Former Smoker    Packs/day: 0.10    Types: Cigarettes    Quit date: 10/2014    Years since quitting: 6.0  . Smokeless tobacco: Never Used  . Tobacco comment: pt states that she is using the E cig only   Vaping Use  . Vaping Use: Some days  Substance and Sexual Activity  . Alcohol use: Yes    Alcohol/week: 0.0 - 1.0 standard drinks    Comment: Occasional; 10-22 rarely   . Drug use: No  . Sexual activity: Not on file  Other Topics Concern  . Not on file  Social History Narrative    Drinks about 2 cups of coffee a day, and some tea    Social Determinants of Health   Financial Resource Strain: Not on file  Food Insecurity: Not on file  Transportation Needs: Not on file  Physical Activity: Not on file  Stress: Not on file  Social Connections: Not on file  Intimate Partner Violence: Not on file    Current Outpatient Medications on File Prior to Visit  Medication Sig Dispense Refill  . Accu-Chek Softclix Lancets lancets 1 each by Other route 3 (three) times daily. Use to monitor glucose levels TID; E11.9    . amLODipine (NORVASC) 5 MG tablet Take 5 mg by mouth daily.    Marland Kitchen aspirin EC 81 MG tablet Take 81 mg by mouth at bedtime.    . bismuth subsalicylate (PEPTO BISMOL) 262 MG/15ML suspension Take 30 mLs by mouth every 6 (six) hours as needed for indigestion or diarrhea or loose stools. Reported on 11/04/2015    .  bromocriptine (PARLODEL) 2.5 MG tablet Take 1 tablet (2.5 mg total) by mouth at bedtime. 90 tablet 3  . famotidine (PEPCID) 20 MG tablet Take 1 tablet (20 mg total) by mouth 2 (two) times daily. 20 tablet 0  . gabapentin (NEURONTIN) 300 MG capsule TAKE 1 CAPSULE BY MOUTH THREE TIMES A DAY 90 capsule 6  . glucose blood (ACCU-CHEK AVIVA PLUS) test strip 1 each by Other route in the morning, at noon, in the evening, and at bedtime. And lancets 4/day 360 each 3  . lovastatin (MEVACOR) 20 MG tablet Take 20 mg by mouth every evening.    . metFORMIN (GLUCOPHAGE) 1000 MG tablet Take 1 tablet (1,000 mg total) by mouth 2 (two) times daily with a meal. 180 tablet 3  . methocarbamol (ROBAXIN) 500 MG tablet Take 1 tablet (500 mg total) by mouth 2 (two) times daily. 20 tablet 0  . metoprolol succinate (TOPROL-XL) 100 MG 24 hr tablet Take 100 mg by mouth daily.    . Netarsudil-Latanoprost (ROCKLATAN) 0.02-0.005 % SOLN Apply to eye.    Marland Kitchen OVER THE COUNTER MEDICATION Apply 1 patch topically daily as needed (pain). Apply to right shoulder    . pantoprazole (PROTONIX) 40 MG tablet  Take 40 mg by mouth daily.    . polyethylene glycol (MIRALAX / GLYCOLAX) 17 g packet Take 17 g by mouth daily as needed for moderate constipation or severe constipation. 14 each 0  . PROAIR HFA 108 (90 BASE) MCG/ACT inhaler Inhale 2 puffs into the lungs every 4 (four) hours as needed for wheezing or shortness of breath. Reported on 11/04/2015    . repaglinide (PRANDIN) 2 MG tablet Take 2 tablets (4 mg total) by mouth 2 (two) times daily before a meal. 360 tablet 3  . sucralfate (CARAFATE) 1 GM/10ML suspension Take 10 mLs (1 g total) by mouth 4 (four) times daily -  with meals and at bedtime. 420 mL 0  . temazepam (RESTORIL) 30 MG capsule Take 30 mg by mouth at bedtime as needed for sleep.  0   No current facility-administered medications on file prior to visit.    Allergies  Allergen Reactions  . Adhesive [Tape] Other (See Comments)    Pt states that tape and electrodes leave red "scars" on her skin and her skin is very sensitive.    Family History  Problem Relation Age of Onset  . Heart disease Father   . Heart attack Father        MI at age 56  . Hypertension Mother   . Alzheimer's disease Mother   . Hypertension Sister   . Diabetes Brother   . Hyperlipidemia Brother   . Hypertension Brother   . Heart disease Brother   . Heart attack Brother   . Breast cancer Maternal Aunt     BP 104/86   Pulse 62   Ht 5\' 3"  (1.6 m)   Wt 117 lb (53.1 kg)   SpO2 95%   BMI 20.73 kg/m    Review of Systems     Objective:   Physical Exam VITAL SIGNS:  See vs page GENERAL: no distress Pulses: dorsalis pedis intact bilat.   MSK: no deformity of the feet CV: trace bilat leg edema.   Skin:  no ulcer on the feet.  normal color and temp on the feet.   Neuro: sensation is intact to touch on the feet.    Lab Results  Component Value Date   HGBA1C 9.5 (A) 11/02/2020  Assessment & Plan:  Type 2 DM, with PAD: uncontrolled.  She has failed oral rx.  I advised multiple daily  injections, but she declines  Patient Instructions  check your blood sugar 4 times a day.  vary the time of day when you check, between before the 3 meals, and at bedtime.  also check if you have symptoms of your blood sugar being too high or too low.  please keep a record of the readings and bring it to your next appointment here (or you can bring the meter itself).  You can write it on any piece of paper.  please call us sooner if your blood sugar goes below 70, or if you have a lot of readings over 200.   I have sent a prescription to your pharmacy, to add insulin.   Please continue the same other medications, but when your blood sugar is better, we can reduce these Please come back for a follow-up appointment in 6 weeks.

## 2020-11-02 NOTE — Patient Instructions (Signed)
Take insulin daily as prescribed by Dr. Loanne Drilling Call if blood sugars drop below 80, or remain over 225.

## 2020-11-02 NOTE — Patient Instructions (Addendum)
check your blood sugar 4 times a day.  vary the time of day when you check, between before the 3 meals, and at bedtime.  also check if you have symptoms of your blood sugar being too high or too low.  please keep a record of the readings and bring it to your next appointment here (or you can bring the meter itself).  You can write it on any piece of paper.  please call us sooner if your blood sugar goes below 70, or if you have a lot of readings over 200.   I have sent a prescription to your pharmacy, to add insulin.   Please continue the same other medications, but when your blood sugar is better, we can reduce these Please come back for a follow-up appointment in 6 weeks.

## 2020-11-10 ENCOUNTER — Telehealth: Payer: Self-pay | Admitting: Nutrition

## 2020-11-10 NOTE — Telephone Encounter (Signed)
Patient reports that she pick up her insulin at the drug store and it is a vial and syringe.  She was shown by me how to use the pen.  She requested a visit to learn how to use this.  She is leaving on Friday to go to [place].  Appointment scheduled for tomorrow for training with Mickel Baas on how to use a syringe.

## 2020-11-11 ENCOUNTER — Ambulatory Visit: Payer: Medicare Other | Admitting: Dietician

## 2020-11-11 ENCOUNTER — Encounter: Payer: Self-pay | Admitting: Dietician

## 2020-11-11 DIAGNOSIS — E1159 Type 2 diabetes mellitus with other circulatory complications: Secondary | ICD-10-CM | POA: Diagnosis not present

## 2020-11-11 DIAGNOSIS — E1151 Type 2 diabetes mellitus with diabetic peripheral angiopathy without gangrene: Secondary | ICD-10-CM

## 2020-11-11 NOTE — Progress Notes (Signed)
Appointment time [phone]  Instructed patient on insulin injection using a vial.   She is to inject 6 units of Novolin N each morning. She was able to demonstrating mixing and injection. Discussed insulin injection sites and rotation. Discussed disposal of needles.  She will need to dispose in a sharps container due to [employer]. Reviewed symptoms and treatment of low blood sugar. She reports being told not to drink juice or regular soda except for low blood sugar.  Provided insulin instruction using a vial in word and picture format along with a handout on hypoglycemia. Provided handout on GERD as well due to patient concerns.  Antonieta Iba, RD, LDN, CDCES

## 2020-11-25 ENCOUNTER — Ambulatory Visit: Payer: Medicare Other | Admitting: Vascular Surgery

## 2020-11-25 ENCOUNTER — Ambulatory Visit (INDEPENDENT_AMBULATORY_CARE_PROVIDER_SITE_OTHER)
Admission: RE | Admit: 2020-11-25 | Discharge: 2020-11-25 | Disposition: A | Discharge status: Home / Self Care | Payer: Medicare Other | Source: Ambulatory Visit | Attending: Vascular Surgery | Admitting: Vascular Surgery

## 2020-11-25 ENCOUNTER — Encounter: Payer: Self-pay | Admitting: Vascular Surgery

## 2020-11-25 ENCOUNTER — Ambulatory Visit (HOSPITAL_COMMUNITY)
Admission: RE | Admit: 2020-11-25 | Discharge: 2020-11-25 | Disposition: A | Discharge status: Home / Self Care | Payer: Medicare Other | Source: Ambulatory Visit | Attending: Physician Assistant | Admitting: Physician Assistant

## 2020-11-25 ENCOUNTER — Other Ambulatory Visit: Payer: Self-pay

## 2020-11-25 VITALS — BP 103/65 | HR 53 | Temp 97.6°F | Resp 20 | Ht 63.0 in | Wt 118.5 lb

## 2020-11-25 DIAGNOSIS — Z95828 Presence of other vascular implants and grafts: Secondary | ICD-10-CM | POA: Diagnosis not present

## 2020-11-25 DIAGNOSIS — I739 Peripheral vascular disease, unspecified: Secondary | ICD-10-CM | POA: Diagnosis not present

## 2020-11-25 NOTE — Progress Notes (Signed)
Patient is 75 year old female who returns for follow-up today.  She was last seen temper 2021.  She previously underwent right femoral endarterectomy right femoral-popliteal bypass in 2014.  She also had a left femoral-popliteal bypass in 2017.  She had angioplasty of the distal anastomosis in 2017.  At the time of her last office visit there were elevated velocities at the left proximal anastomosis at 294 cm/s.  She is having some pain in her calves when she walks.  However she had a very large operation a few months back and this may be somewhat due to deconditioning.  She does have diabetes and has had some control problems recently but they are adjusting her medications.  She is now on insulin.  Past Surgical History:  Procedure Laterality Date  . ABDOMINAL AORTAGRAM N/A 08/15/2013   Procedure: ABDOMINAL Ronny Flurry;  Surgeon: Sherren Kerns, MD;  Location: [facility] CATH LAB;  Service: Cardiovascular;  Laterality: N/A;  . ABDOMINAL AORTAGRAM N/A 06/19/2014   Procedure: ABDOMINAL Ronny Flurry;  Surgeon: Sherren Kerns, MD;  Location: [facility] CATH LAB;  Service: Cardiovascular;  Laterality: N/A;  . ABDOMINAL AORTOGRAM W/LOWER EXTREMITY N/A 05/10/2018   Procedure: ABDOMINAL AORTOGRAM W/LOWER EXTREMITY;  Surgeon: Sherren Kerns, MD;  Location: [facility] INVASIVE CV LAB;  Service: Cardiovascular;  Laterality: N/A;  bilateral  . Aortogram w/ PTA  05/14/08,  11-04-10   Bilateral aortogram w/ bilateral  SFA PTA  stenting   . BREAST SURGERY Right    boil removal  . BUNIONECTOMY     L foot in the 1980s  . COLONOSCOPY     2014  . DILATION AND CURETTAGE OF UTERUS     X4  . ENDARTERECTOMY FEMORAL Right 06/03/2018   Procedure: RIGHT FEMORAL ENDARTERECTOMY;  Surgeon: Sherren Kerns, MD;  Location: [facility] OR;  Service: Vascular;  Laterality: Right;  . Epidural shots in neck     . ESOPHAGOGASTRODUODENOSCOPY (EGD) WITH PROPOFOL N/A 09/15/2019   Procedure: ESOPHAGOGASTRODUODENOSCOPY (EGD) WITH PROPOFOL;  Surgeon: Willis Modena, MD;  Location: [facility] ENDOS[facility];  Service: Endoscopy;  Laterality: N/A;  . EUS N/A 09/15/2019   Procedure: UPPER ENDOSCOPIC ULTRASOUND (EUS) LINEAR;  Surgeon: Willis Modena, MD;  Location: [facility]OS[facility];  Service: Endoscopy;  Laterality: N/A;  . EYE SURGERY Bilateral 2016   cataract removal  . FEMORAL-POPLITEAL BYPASS GRAFT Left 10/21/2013   Procedure: LEFT FEMORAL-POPLITEAL ARTERY BYPASS WITH SAPHENOUS VEIN GRAFT , POPLITEAL ENDARTERECTOMY ,INTRAOPERATIVE ARTERIOGRAM, vein patch angioplasty to popliteal artery;  Surgeon: Sherren Kerns, MD;  Location: [facility] OR;  Service: Vascular;  Laterality: Left;  . FEMORAL-POPLITEAL BYPASS GRAFT Right 02/08/2016   Procedure: Right FEMORAL- to Above Knee POPLITEAL ARTERY Bypass Graft with reversed saphenous vein and Common Femoral Endarterectomy  with profundoplasty;  Surgeon: Sherren Kerns, MD;  Location: [facility] OR;  Service: Vascular;  Laterality: Right;  . FINE NEEDLE ASPIRATION N/A 09/15/2019   Procedure: FINE NEEDLE ASPIRATION (FNA) LINEAR;  Surgeon: Willis Modena, MD;  [facility]OS[facility];  Service: Endoscopy;  Laterality: N/A;  . GROIN DEBRIDEMENT Left 10/28/2013   Procedure: left inner thigh DEBRIDEMENT;  Surgeon: Sherren Kerns, MD;  Location: [facility] OR;  Service: Vascular;  Laterality: Left;  . HAND SURGERY Left 2010  . LOWER EXTREMITY ANGIOGRAM Bilateral 12/24/2015   Procedure: Lower Extremity Angiogram;  Surgeon: Sherren Kerns, MD;  Location: [facility] INVASIVE CV LAB;  Service: Cardiovascular;  Laterality: Bilateral;  . PATCH ANGIOPLASTY Right 06/03/2018   Procedure: PATCH ANGIOPLASTY USING A XENOSURE 1CM X 14CM BIOLOGIC PATCH;  Surgeon: Elam Dutch, MD;  Location: [place];  Service: Vascular;  Laterality: Right;  . PERIPHERAL VASCULAR CATHETERIZATION N/A 12/24/2015   Procedure: Abdominal Aortogram;  Surgeon: Elam Dutch, MD;  Location: [place] CV LAB;  Service: Cardiovascular;  Laterality: N/A;  . PERIPHERAL VASCULAR CATHETERIZATION Left  12/24/2015   Procedure: Peripheral Vascular Balloon Angioplasty;  Surgeon: Elam Dutch, MD;  Location: [place] CV LAB;  Service: Cardiovascular;  Laterality: Left;  drug coated balloon  . PERIPHERAL VASCULAR CATHETERIZATION N/A 06/30/2016   Procedure: Abdominal Aortogram;  Surgeon: Elam Dutch, MD;  Location: [place] CV LAB;  Service: Cardiovascular;  Laterality: N/A;  . PERIPHERAL VASCULAR CATHETERIZATION Bilateral 06/30/2016   Procedure: Lower Extremity Angiography;  Surgeon: Elam Dutch, MD;  Location: [place] CV LAB;  Service: Cardiovascular;  Laterality: Bilateral;  . TONSILLECTOMY       Past Medical History:  Diagnosis Date  . Bronchitis   . Bulging disc    in neck  . Depression with anxiety   . Diabetes mellitus    Type II  . Fibromyalgia    pt. denies  . GERD (gastroesophageal reflux disease)   . Glaucoma   . History of bronchitis   . Hyperlipidemia   . Hypertension   . Overactive bladder   . Peripheral vascular disease ([place])   . Pneumonia   . Sleep apnea    recent test negative for sleep apnea  . Tobacco abuse   . Urinary frequency    Current Outpatient Medications on File Prior to Visit  Medication Sig Dispense Refill  . Accu-Chek Softclix Lancets lancets 1 each by Other route 3 (three) times daily. Use to monitor glucose levels TID; E11.9    . amLODipine (NORVASC) 5 MG tablet Take 5 mg by mouth daily.    Marland Kitchen aspirin EC 81 MG tablet Take 81 mg by mouth at bedtime.    . bismuth subsalicylate (PEPTO BISMOL) 262 MG/15ML suspension Take 30 mLs by mouth every 6 (six) hours as needed for indigestion or diarrhea or loose stools. Reported on 11/04/2015    . bromocriptine (PARLODEL) 2.5 MG tablet Take 1 tablet (2.5 mg total) by mouth at bedtime. 90 tablet 3  . famotidine (PEPCID) 20 MG tablet Take 1 tablet (20 mg total) by mouth 2 (two) times daily. 20 tablet 0  . gabapentin (NEURONTIN) 300 MG capsule TAKE 1 CAPSULE BY MOUTH THREE TIMES A DAY 90 capsule  6  . glucose blood (ACCU-CHEK AVIVA PLUS) test strip 1 each by Other route in the morning, at noon, in the evening, and at bedtime. And lancets 4/day 360 each 3  . insulin NPH Human (NOVOLIN N) 100 UNIT/ML injection Inject 0.06 mLs (6 Units total) into the skin every morning. And syringes 1/day 10 mL 11  . lovastatin (MEVACOR) 20 MG tablet Take 20 mg by mouth every evening.    . metFORMIN (GLUCOPHAGE) 1000 MG tablet Take 1 tablet (1,000 mg total) by mouth 2 (two) times daily with a meal. 180 tablet 3  . methocarbamol (ROBAXIN) 500 MG tablet Take 1 tablet (500 mg total) by mouth 2 (two) times daily. 20 tablet 0  . metoprolol succinate (TOPROL-XL) 100 MG 24 hr tablet Take 100 mg by mouth daily.    . Netarsudil-Latanoprost (ROCKLATAN) 0.02-0.005 % SOLN Apply to eye.    Marland Kitchen OVER THE COUNTER MEDICATION Apply 1 patch topically daily as needed (pain). Apply to right shoulder    . pantoprazole (PROTONIX) 40 MG tablet  Take 40 mg by mouth daily.    . polyethylene glycol (MIRALAX / GLYCOLAX) 17 g packet Take 17 g by mouth daily as needed for moderate constipation or severe constipation. 14 each 0  . PROAIR HFA 108 (90 BASE) MCG/ACT inhaler Inhale 2 puffs into the lungs every 4 (four) hours as needed for wheezing or shortness of breath. Reported on 11/04/2015    . repaglinide (PRANDIN) 2 MG tablet Take 2 tablets (4 mg total) by mouth 2 (two) times daily before a meal. 360 tablet 3  . temazepam (RESTORIL) 30 MG capsule Take 30 mg by mouth at bedtime as needed for sleep.  0   No current facility-administered medications on file prior to visit.    Review of systems: She has no shortness of breath.  She has no chest pain.  Physical exam:  Vitals:   11/25/20 1332  BP: 103/65  Pulse: (!) 53  Resp: 20  Temp: 97.6 F (36.4 C)  SpO2: 95%  Weight: 118 lb 8 oz (53.8 kg)  Height: 5\' 3"  (1.6 m)    Extremities: 2+ dorsalis pedis pulses bilaterally  Data: Right side ABI 0.92 left side 1.01.  These are  essentially unchanged from her last office visit.  I reviewed and interpreted the study.  She also had a repeat duplex exam which showed the velocities on the left side are now 229 cm/s slightly decreased and on grayscale there is no obvious narrowing.  Right leg bypass was widely patent.  Assessment: Doing well status post bilateral femoral-popliteal bypass grafting.  She may still have a little bit of narrowing proximal to the left anastomosis.  However, her velocities are decreased compared to her last office visit.  Grayscale images did not really show any anatomic narrowing.  Plan: Patient will continues to exercise daily and try to walk for 30 minutes.  She will follow-up in 6 months with repeat ABIs and graft duplex scan.  If her numbers are stable at that point she could probably go to once a year.  She will be seen in our APP clinic.  Ruta Hinds, MD [facility] [place] Office: [phone]

## 2020-11-26 ENCOUNTER — Ambulatory Visit
Admission: RE | Admit: 2020-11-26 | Discharge: 2020-11-26 | Disposition: A | Discharge status: Home / Self Care | Payer: Medicare Other | Source: Ambulatory Visit | Attending: Family Medicine | Admitting: Family Medicine

## 2020-11-26 DIAGNOSIS — Z1231 Encounter for screening mammogram for malignant neoplasm of breast: Secondary | ICD-10-CM

## 2020-12-01 ENCOUNTER — Other Ambulatory Visit: Payer: Self-pay

## 2020-12-03 ENCOUNTER — Encounter: Payer: Self-pay | Admitting: Endocrinology

## 2020-12-03 ENCOUNTER — Ambulatory Visit (INDEPENDENT_AMBULATORY_CARE_PROVIDER_SITE_OTHER): Payer: Medicare Other | Admitting: Endocrinology

## 2020-12-03 ENCOUNTER — Other Ambulatory Visit: Payer: Self-pay

## 2020-12-03 ENCOUNTER — Ambulatory Visit: Payer: Medicare Other | Admitting: Endocrinology

## 2020-12-03 VITALS — BP 122/76 | HR 58 | Ht 62.0 in | Wt 121.0 lb

## 2020-12-03 DIAGNOSIS — E1151 Type 2 diabetes mellitus with diabetic peripheral angiopathy without gangrene: Secondary | ICD-10-CM | POA: Diagnosis not present

## 2020-12-03 LAB — POCT GLYCOSYLATED HEMOGLOBIN (HGB A1C): Hemoglobin A1C: 8.1 % — AB (ref 4.0–5.6)

## 2020-12-03 MED ORDER — METOPROLOL SUCCINATE ER 100 MG PO TB24: 50.0000 mg | ORAL_TABLET | Freq: Every day | ORAL | 1 refills | Status: DC

## 2020-12-03 MED ORDER — REPAGLINIDE 2 MG PO TABS: 2.0000 mg | ORAL_TABLET | Freq: Two times a day (BID) | ORAL | 3 refills | Status: DC

## 2020-12-03 NOTE — Progress Notes (Signed)
Subjective:    Patient ID: Jill Higgins, female    DOB: June 22, 1946, 75 y.o.   MRN: [id number]  HPI Pt returns for f/u of diabetes mellitus:  DM type: 2 Dx'ed: 0000000 Complications: CAD and PAD.   Therapy: insulin since 2021, and 3 oral meds.   GDM: never DKA: never Severe hypoglycemia: never Pancreatitis: once (2020) Pancreatic imaging: normal on 2019 CT.   SDOH: She cannot afford name brand meds. 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   Interval history: She had a syncopal episode 2 days ago, while she was eating.  ECG done by EMS (brought by pt today), showed bradycardia, but cbg was not checked. She declined transport to ER.  no cbg record, but states cbg's vary from 57-160.   Past Medical History:  Diagnosis Date  . Bronchitis   . Bulging disc    in neck  . Depression with anxiety   . Diabetes mellitus    Type II  . Fibromyalgia    pt. denies  . GERD (gastroesophageal reflux disease)   . Glaucoma   . History of bronchitis   . Hyperlipidemia   . Hypertension   . Overactive bladder   . Peripheral vascular disease ([place])   . Pneumonia   . Sleep apnea    recent test negative for sleep apnea  . Tobacco abuse   . Urinary frequency     Past Surgical History:  Procedure Laterality Date  . ABDOMINAL AORTAGRAM N/A 08/15/2013   Procedure: ABDOMINAL Maxcine Ham;  Surgeon: Elam Dutch, MD;  Location: [facility] CATH LAB;  Service: Cardiovascular;  Laterality: N/A;  . ABDOMINAL AORTAGRAM N/A 06/19/2014   Procedure: ABDOMINAL Maxcine Ham;  Surgeon: Elam Dutch, MD;  Location: [facility] CATH LAB;  Service: Cardiovascular;  Laterality: N/A;  . ABDOMINAL AORTOGRAM W/LOWER EXTREMITY N/A 05/10/2018   Procedure: ABDOMINAL AORTOGRAM W/LOWER EXTREMITY;  Surgeon: Elam Dutch, MD;  Location: [place] CV LAB;  Service: Cardiovascular;  Laterality: N/A;  bilateral  . Aortogram w/ PTA  05/14/08,  11-04-10    Bilateral aortogram w/ bilateral  SFA PTA  stenting   . BREAST SURGERY Right    boil removal  . BUNIONECTOMY     L foot in the 1980s  . COLONOSCOPY     2014  . DILATION AND CURETTAGE OF UTERUS     X4  . ENDARTERECTOMY FEMORAL Right 06/03/2018   Procedure: RIGHT FEMORAL ENDARTERECTOMY;  Surgeon: Elam Dutch, MD;  Location: [facility] OR;  Service: Vascular;  Laterality: Right;  . Epidural shots in neck     . ESOPHAGOGASTRODUODENOSCOPY (EGD) WITH PROPOFOL N/A 09/15/2019   Procedure: ESOPHAGOGASTRODUODENOSCOPY (EGD) WITH PROPOFOL;  Surgeon: Arta Silence, MD;  [facility] ENDOSCOPY;  Service: Endoscopy;  Laterality: N/A;  . EUS N/A 09/15/2019   Procedure: UPPER ENDOSCOPIC ULTRASOUND (EUS) LINEAR;  Surgeon: Arta Silence, MD;  [facility]: [facility] ENDOS[facility];  Service: Endoscopy;  Laterality: N/A;  . EYE SURGERY Bilateral 2016   cataract removal  . FEMORAL-POPLITEAL BYPASS GRAFT Left 10/21/2013   Procedure: LEFT FEMORAL-POPLITEAL ARTERY BYPASS WITH SAPHENOUS VEIN GRAFT , POPLITEAL ENDARTERECTOMY ,INTRAOPERATIVE ARTERIOGRAM, vein patch angioplasty to popliteal artery;  Surgeon: Elam Dutch, MD;  Location: [place];  Service: Vascular;  Laterality: Left;  . FEMORAL-POPLITEAL BYPASS GRAFT Right 02/08/2016   Procedure: Right FEMORAL- to Above Knee POPLITEAL ARTERY Bypass Graft with reversed saphenous vein and Common Femoral Endarterectomy  with profundoplasty;  Surgeon: Elam Dutch,  MD;  Location: [place];  Service: Vascular;  Laterality: Right;  . FINE NEEDLE ASPIRATION N/A 09/15/2019   Procedure: FINE NEEDLE ASPIRATION (FNA) LINEAR;  Surgeon: Arta Silence, MD;  [facility];  Service: Endoscopy;  Laterality: N/A;  . GROIN DEBRIDEMENT Left 10/28/2013   Procedure: left inner thigh DEBRIDEMENT;  Surgeon: Elam Dutch, MD;  Location: [place];  Service: Vascular;  Laterality: Left;  . HAND SURGERY Left 2010  . LOWER EXTREMITY ANGIOGRAM Bilateral 12/24/2015   Procedure: Lower Extremity  Angiogram;  Surgeon: Elam Dutch, MD;  Location: [place] CV LAB;  Service: Cardiovascular;  Laterality: Bilateral;  . PATCH ANGIOPLASTY Right 06/03/2018   Procedure: PATCH ANGIOPLASTY USING A XENOSURE 1CM X 14CM BIOLOGIC PATCH;  Surgeon: Elam Dutch, MD;  Location: [place];  Service: Vascular;  Laterality: Right;  . PERIPHERAL VASCULAR CATHETERIZATION N/A 12/24/2015   Procedure: Abdominal Aortogram;  Surgeon: Elam Dutch, MD;  Location: [place] CV LAB;  Service: Cardiovascular;  Laterality: N/A;  . PERIPHERAL VASCULAR CATHETERIZATION Left 12/24/2015   Procedure: Peripheral Vascular Balloon Angioplasty;  Surgeon: Elam Dutch, MD;  Location: [place] CV LAB;  Service: Cardiovascular;  Laterality: Left;  drug coated balloon  . PERIPHERAL VASCULAR CATHETERIZATION N/A 06/30/2016   Procedure: Abdominal Aortogram;  Surgeon: Elam Dutch, MD;  Location: [place] CV LAB;  Service: Cardiovascular;  Laterality: N/A;  . PERIPHERAL VASCULAR CATHETERIZATION Bilateral 06/30/2016   Procedure: Lower Extremity Angiography;  Surgeon: Elam Dutch, MD;  Location: [place] CV LAB;  Service: Cardiovascular;  Laterality: Bilateral;  . TONSILLECTOMY      Social History   Socioeconomic History  . Marital status: Single    Spouse name: Not on file  . Number of children: 1  . Years of education: Not on file  . Highest education level: Not on file  Occupational History  . 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 Smoking status: Former Smoker    Packs/day: 0.10    Types: Cigarettes    Quit date: 10/2014    Years since quitting: 6.1  . Smokeless tobacco: Never Used  . Tobacco comment: pt states that she is using the E cig only   Vaping Use  . Vaping Use: Some days  Substance and Sexual Activity  . Alcohol use: Yes    Alcohol/week: 0.0 - 1.0 standard drinks    Comment: Occasional; 10-22 rarely   . Drug use: No  . Sexual activity: Not on file  Other Topics  Concern  . Not on file  Social History Narrative   Drinks about 2 cups of coffee a day, and some tea    Social Determinants of Health   Financial Resource Strain: Not on file  Food Insecurity: Not on file  Transportation Needs: Not on file  Physical Activity: Not on file  Stress: Not on file  Social Connections: Not on file  Intimate Partner Violence: Not on file    Current Outpatient Medications on File Prior to Visit  Medication Sig Dispense Refill  . Accu-Chek Softclix Lancets lancets 1 each by Other route 3 (three) times daily. Use to monitor glucose levels TID; E11.9    . amLODipine (NORVASC) 5 MG tablet Take 5 mg by mouth daily.    Marland Kitchen aspirin EC 81 MG tablet Take 81 mg by mouth at bedtime.    . bismuth subsalicylate (PEPTO BISMOL) 262 MG/15ML suspension Take 30 mLs by mouth every 6 (six) hours as  needed for indigestion or diarrhea or loose stools. Reported on 11/04/2015    . bromocriptine (PARLODEL) 2.5 MG tablet Take 1 tablet (2.5 mg total) by mouth at bedtime. 90 tablet 3  . famotidine (PEPCID) 20 MG tablet Take 1 tablet (20 mg total) by mouth 2 (two) times daily. 20 tablet 0  . gabapentin (NEURONTIN) 300 MG capsule TAKE 1 CAPSULE BY MOUTH THREE TIMES A DAY 90 capsule 6  . glucose blood (ACCU-CHEK AVIVA PLUS) test strip 1 each by Other route in the morning, at noon, in the evening, and at bedtime. And lancets 4/day 360 each 3  . insulin NPH Human (NOVOLIN N) 100 UNIT/ML injection Inject 0.06 mLs (6 Units total) into the skin every morning. And syringes 1/day 10 mL 11  . lovastatin (MEVACOR) 20 MG tablet Take 20 mg by mouth every evening.    . metFORMIN (GLUCOPHAGE) 1000 MG tablet Take 1 tablet (1,000 mg total) by mouth 2 (two) times daily with a meal. 180 tablet 3  . methocarbamol (ROBAXIN) 500 MG tablet Take 1 tablet (500 mg total) by mouth 2 (two) times daily. 20 tablet 0  . Netarsudil-Latanoprost (ROCKLATAN) 0.02-0.005 % SOLN Apply to eye.    Marland Kitchen. OVER THE COUNTER MEDICATION  Apply 1 patch topically daily as needed (pain). Apply to right shoulder    . pantoprazole (PROTONIX) 40 MG tablet Take 40 mg by mouth daily.    . polyethylene glycol (MIRALAX / GLYCOLAX) 17 g packet Take 17 g by mouth daily as needed for moderate constipation or severe constipation. 14 each 0  . PROAIR HFA 108 (90 BASE) MCG/ACT inhaler Inhale 2 puffs into the lungs every 4 (four) hours as needed for wheezing or shortness of breath. Reported on 11/04/2015    . temazepam (RESTORIL) 30 MG capsule Take 30 mg by mouth at bedtime as needed for sleep.  0   No current facility-administered medications on file prior to visit.    Allergies  Allergen Reactions  . Adhesive [Tape] Other (See Comments)    Pt states that tape and electrodes leave red "scars" on her skin and her skin is very sensitive.    Family History  Problem Relation Age of Onset  . Heart disease Father   . Heart attack Father        MI at age 76  . Hypertension Mother   . Alzheimer's disease Mother   . Hypertension Sister   . Diabetes Brother   . Hyperlipidemia Brother   . Hypertension Brother   . Heart disease Brother   . Heart attack Brother   . Breast cancer Maternal Aunt     BP 122/76   Pulse (!) 58   Ht 5\' 2"  (1.575 m)   Wt 121 lb (54.9 kg)   SpO2 95%   BMI 22.13 kg/m    Review of Systems     Objective:   Physical Exam VITAL SIGNS:  See vs page GENERAL: no distress Pulses: dorsalis pedis intact bilat.   MSK: no deformity of the feet CV: trace bilat leg edema.   Skin:  no ulcer on the feet.  normal color and temp on the feet.   Neuro: sensation is intact to touch on the feet.   outside test results are reviewed:  TSH=2.7  Lab Results  Component Value Date   HGBA1C 8.1 (A) 12/03/2020      Assessment & Plan:  Type 2 DM, with PAD.  Hypoglycemia, due to repaglinide.   Syncope: as this  happend while pt was eating, I favor bradycardia as a cause rather than hypoglycemia.   Patient Instructions   check your blood sugar 4 times a day.  vary the time of day when you check, between before the 3 meals, and at bedtime.  also check if you have symptoms of your blood sugar being too high or too low.  please keep a record of the readings and bring it to your next appointment here (or you can bring the meter itself).  You can write it on any piece of paper.  please call us sooner if your blood sugar goes below 70, or if you have a lot of readings over 200.   Please half both the metoprolol and repaglinide, and continue the same other medications.   Please call for an appointment with Dr Stanford Breed. Please come back for a follow-up appointment in 2 months.

## 2020-12-03 NOTE — Patient Instructions (Addendum)
check your blood sugar 4 times a day.  vary the time of day when you check, between before the 3 meals, and at bedtime.  also check if you have symptoms of your blood sugar being too high or too low.  please keep a record of the readings and bring it to your next appointment here (or you can bring the meter itself).  You can write it on any piece of paper.  please call us sooner if your blood sugar goes below 70, or if you have a lot of readings over 200.   Please half both the metoprolol and repaglinide, and continue the same other medications.   Please call for an appointment with Dr Stanford Breed. Please come back for a follow-up appointment in 2 months.

## 2020-12-07 ENCOUNTER — Ambulatory Visit: Payer: Medicare Other | Admitting: Endocrinology

## 2020-12-08 ENCOUNTER — Encounter: Payer: Self-pay | Admitting: Cardiology

## 2020-12-08 ENCOUNTER — Ambulatory Visit: Payer: Medicare Other | Admitting: Cardiology

## 2020-12-08 ENCOUNTER — Encounter: Payer: Self-pay | Admitting: *Deleted

## 2020-12-08 ENCOUNTER — Other Ambulatory Visit: Payer: Self-pay

## 2020-12-08 VITALS — BP 124/72 | HR 66 | Ht 63.0 in | Wt 121.0 lb

## 2020-12-08 DIAGNOSIS — R55 Syncope and collapse: Secondary | ICD-10-CM

## 2020-12-08 DIAGNOSIS — I739 Peripheral vascular disease, unspecified: Secondary | ICD-10-CM

## 2020-12-08 DIAGNOSIS — E7800 Pure hypercholesterolemia, unspecified: Secondary | ICD-10-CM

## 2020-12-08 DIAGNOSIS — R072 Precordial pain: Secondary | ICD-10-CM | POA: Diagnosis not present

## 2020-12-08 NOTE — Addendum Note (Signed)
Addended by: Jacqulynn Cadet on: 12/08/2020 04:27 PM   Modules accepted: Orders

## 2020-12-08 NOTE — Progress Notes (Signed)
Jill Nieceeferring-Jill Ellison MD Reason for referral-syncope  HPI: 75 yo female for evaluation of syncope at request of [name]. Seen previously but not since 2016. Nuclear study 1/17 showed EF 58 and no ischemia.  Patient history of peripheral vascular changes.  Patient is status post right femoral endarterectomy and right fem-pop bypass 2014.  She also has had a left femoral-popliteal bypass in 2017 and angioplasty of the distal anastomosis in 2017. PVD followed by vascular surgery.  Patient states she has occasional discomfort in her chest that may be indigestion.  That occurs both with activities and at rest.  It is substernal without radiation and she feels as though it may be indigestion.  No associated symptoms.  She also notes some dyspnea on exertion.  No orthopnea, PND or pedal edema.  She has claudication after walking approximately 50 steps.  She was eating at a restaurant and feels as though her sugar decreased.  She felt sweaty with an uncomfortable feeling in her chest and then had frank syncope.  She was unconscious for approximately 1 to 2 minutes by her report.  No preceding palpitations, nausea or diaphoresis.  She had nausea and vomiting after she awoke and then felt better.  No symptoms since.  Note she has had occasional skips but no sustained palpitations.  Cardiology now asked to evaluate.  Current Outpatient Medications  Medication Sig Dispense Refill  . Accu-Chek Softclix Lancets lancets 1 each by Other route 3 (three) times daily. Use to monitor glucose levels TID; E11.9    . amLODipine (NORVASC) 5 MG tablet Take 5 mg by mouth daily.    Marland Kitchen. aspirin EC 81 MG tablet Take 81 mg by mouth at bedtime.    . bismuth subsalicylate (PEPTO BISMOL) 262 MG/15ML suspension Take 30 mLs by mouth every 6 (six) hours as needed for indigestion or diarrhea or loose stools. Reported on 11/04/2015    . bromocriptine (PARLODEL) 2.5 MG tablet Take 1 tablet (2.5 mg total) by mouth at bedtime. 90  tablet 3  . famotidine (PEPCID) 20 MG tablet Take 1 tablet (20 mg total) by mouth 2 (two) times daily. 20 tablet 0  . gabapentin (NEURONTIN) 300 MG capsule TAKE 1 CAPSULE BY MOUTH THREE TIMES A DAY 90 capsule 6  . glucose blood (ACCU-CHEK AVIVA PLUS) test strip 1 each by Other route in the morning, at noon, in the evening, and at bedtime. And lancets 4/day 360 each 3  . insulin NPH Human (NOVOLIN N) 100 UNIT/ML injection Inject 0.06 mLs (6 Units total) into the skin every morning. And syringes 1/day 10 mL 11  . lovastatin (MEVACOR) 20 MG tablet Take 20 mg by mouth every evening.    . metFORMIN (GLUCOPHAGE) 1000 MG tablet Take 1 tablet (1,000 mg total) by mouth 2 (two) times daily with a meal. 180 tablet 3  . methocarbamol (ROBAXIN) 500 MG tablet Take 1 tablet (500 mg total) by mouth 2 (two) times daily. 20 tablet 0  . metoprolol succinate (TOPROL-XL) 100 MG 24 hr tablet Take 0.5 tablets (50 mg total) by mouth daily. 15 tablet 1  . Netarsudil-Latanoprost (ROCKLATAN) 0.02-0.005 % SOLN Apply to eye.    Marland Kitchen. OVER THE COUNTER MEDICATION Apply 1 patch topically daily as needed (pain). Apply to right shoulder    . pantoprazole (PROTONIX) 40 MG tablet Take 40 mg by mouth daily.    . polyethylene glycol (MIRALAX / GLYCOLAX) 17 g packet Take 17 g by mouth daily as needed for moderate  constipation or severe constipation. 14 each 0  . PROAIR HFA 108 (90 BASE) MCG/ACT inhaler Inhale 2 puffs into the lungs every 4 (four) hours as needed for wheezing or shortness of breath. Reported on 11/04/2015    . repaglinide (PRANDIN) 2 MG tablet Take 1 tablet (2 mg total) by mouth 2 (two) times daily before a meal. 180 tablet 3  . temazepam (RESTORIL) 30 MG capsule Take 30 mg by mouth at bedtime as needed for sleep.  0   No current facility-administered medications for this visit.    Allergies  Allergen Reactions  . Adhesive [Tape] Other (See Comments)    Pt states that tape and electrodes leave red "scars" on her skin  and her skin is very sensitive.     Past Medical History:  Diagnosis Date  . Bronchitis   . Bulging disc    in neck  . Depression with anxiety   . Diabetes mellitus    Type II  . Fibromyalgia    pt. denies  . GERD (gastroesophageal reflux disease)   . Glaucoma   . History of bronchitis   . Hyperlipidemia   . Hypertension   . Overactive bladder   . Peripheral vascular disease ([place])   . Pneumonia   . Sleep apnea    recent test negative for sleep apnea  . Tobacco abuse   . Urinary frequency     Past Surgical History:  Procedure Laterality Date  . ABDOMINAL AORTAGRAM N/A 08/15/2013   Procedure: ABDOMINAL Maxcine Ham;  Surgeon: Elam Dutch, MD;  Location: [facility] CATH LAB;  Service: Cardiovascular;  Laterality: N/A;  . ABDOMINAL AORTAGRAM N/A 06/19/2014   Procedure: ABDOMINAL Maxcine Ham;  Surgeon: Elam Dutch, MD;  Location: [facility] CATH LAB;  Service: Cardiovascular;  Laterality: N/A;  . ABDOMINAL AORTOGRAM W/LOWER EXTREMITY N/A 05/10/2018   Procedure: ABDOMINAL AORTOGRAM W/LOWER EXTREMITY;  Surgeon: Elam Dutch, MD;  Location: [place] CV LAB;  Service: Cardiovascular;  Laterality: N/A;  bilateral  . Aortogram w/ PTA  05/14/08,  11-04-10   Bilateral aortogram w/ bilateral  SFA PTA  stenting   . BREAST SURGERY Right    boil removal  . BUNIONECTOMY     L foot in the 1980s  . COLONOSCOPY     2014  . DILATION AND CURETTAGE OF UTERUS     X4  . ENDARTERECTOMY FEMORAL Right 06/03/2018   Procedure: RIGHT FEMORAL ENDARTERECTOMY;  Surgeon: Elam Dutch, MD;  Location: [facility] OR;  Service: Vascular;  Laterality: Right;  . Epidural shots in neck     . ESOPHAGOGASTRODUODENOSCOPY (EGD) WITH PROPOFOL N/A 09/15/2019   Procedure: ESOPHAGOGASTRODUODENOSCOPY (EGD) WITH PROPOFOL;  Surgeon: Arta Silence, MD;  [facility];  Service: Endoscopy;  Laterality: N/A;  . EUS N/A 09/15/2019   Procedure: UPPER ENDOSCOPIC ULTRASOUND (EUS) LINEAR;  Surgeon: Arta Silence, MD;   [facility];  Service: Endoscopy;  Laterality: N/A;  . EYE SURGERY Bilateral 2016   cataract removal  . FEMORAL-POPLITEAL BYPASS GRAFT Left 10/21/2013   Procedure: LEFT FEMORAL-POPLITEAL ARTERY BYPASS WITH SAPHENOUS VEIN GRAFT , POPLITEAL ENDARTERECTOMY ,INTRAOPERATIVE ARTERIOGRAM, vein patch angioplasty to popliteal artery;  Surgeon: Elam Dutch, MD;  Location: [place];  Service: Vascular;  Laterality: Left;  . FEMORAL-POPLITEAL BYPASS GRAFT Right 02/08/2016   Procedure: Right FEMORAL- to Above Knee POPLITEAL ARTERY Bypass Graft with reversed saphenous vein and Common Femoral Endarterectomy  with profundoplasty;  Surgeon: Elam Dutch, MD;  Location: [place];  Service: Vascular;  Laterality: Right;  .  FINE NEEDLE ASPIRATION N/A 09/15/2019   Procedure: FINE NEEDLE ASPIRATION (FNA) LINEAR;  Surgeon: Arta Silence, MD;  [facility];  Service: Endoscopy;  Laterality: N/A;  . GROIN DEBRIDEMENT Left 10/28/2013   Procedure: left inner thigh DEBRIDEMENT;  Surgeon: Elam Dutch, MD;  Location: [place];  Service: Vascular;  Laterality: Left;  . HAND SURGERY Left 2010  . LOWER EXTREMITY ANGIOGRAM Bilateral 12/24/2015   Procedure: Lower Extremity Angiogram;  Surgeon: Elam Dutch, MD;  Location: [place] CV LAB;  Service: Cardiovascular;  Laterality: Bilateral;  . PATCH ANGIOPLASTY Right 06/03/2018   Procedure: PATCH ANGIOPLASTY USING A XENOSURE 1CM X 14CM BIOLOGIC PATCH;  Surgeon: Elam Dutch, MD;  Location: [place];  Service: Vascular;  Laterality: Right;  . PERIPHERAL VASCULAR CATHETERIZATION N/A 12/24/2015   Procedure: Abdominal Aortogram;  Surgeon: Elam Dutch, MD;  Location: [place] CV LAB;  Service: Cardiovascular;  Laterality: N/A;  . PERIPHERAL VASCULAR CATHETERIZATION Left 12/24/2015   Procedure: Peripheral Vascular Balloon Angioplasty;  Surgeon: Elam Dutch, MD;  Location: [place] CV LAB;  Service: Cardiovascular;  Laterality: Left;  drug coated  balloon  . PERIPHERAL VASCULAR CATHETERIZATION N/A 06/30/2016   Procedure: Abdominal Aortogram;  Surgeon: Elam Dutch, MD;  Location: [place] CV LAB;  Service: Cardiovascular;  Laterality: N/A;  . PERIPHERAL VASCULAR CATHETERIZATION Bilateral 06/30/2016   Procedure: Lower Extremity Angiography;  Surgeon: Elam Dutch, MD;  Location: [place] CV LAB;  Service: Cardiovascular;  Laterality: Bilateral;  . TONSILLECTOMY      Social History   Socioeconomic History  . Marital status: Single    Spouse name: Not on file  . Number of children: 1  . Years of education: Not on file  . Highest education level: Not on file  Occupational History  . 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 Smoking status: Former Smoker    Packs/day: 0.10    Types: Cigarettes    Quit date: 10/2014    Years since quitting: 6.1  . Smokeless tobacco: Never Used  . Tobacco comment: pt states that she is using the E cig only   Vaping Use  . Vaping Use: Some days  Substance and Sexual Activity  . Alcohol use: Yes    Alcohol/week: 0.0 - 1.0 standard drinks    Comment: Occasional; 10-22 rarely   . Drug use: No  . Sexual activity: Not on file  Other Topics Concern  . Not on file  Social History Narrative   Drinks about 2 cups of coffee a day, and some tea    Social Determinants of Radio broadcast assistant Strain: Not on file  Food Insecurity: Not on file  Transportation Needs: Not on file  Physical Activity: Not on file  Stress: Not on file  Social Connections: Not on file  Intimate Partner Violence: Not on file    Family History  Problem Relation Age of Onset  . Heart disease Father   . Heart attack Father        MI at age 41  . Hypertension Mother   . Alzheimer's disease Mother   . Hypertension Sister   . Diabetes Brother   . Hyperlipidemia Brother   . Hypertension Brother   . Heart disease Brother   . Heart attack Brother   . Breast cancer Maternal Aunt      ROS: no fevers or chills, productive cough, hemoptysis, dysphasia, odynophagia, melena, hematochezia, dysuria, hematuria, rash, seizure activity,  orthopnea, PND, pedal edema. Remaining systems are negative.  Physical Exam:   There were no vitals taken for this visit.  General:  Well developed/well nourished in NAD Skin warm/dry Patient not depressed No peripheral clubbing Back-normal HEENT-normal/normal eyelids Neck supple/normal carotid upstroke bilaterally; no bruits; no JVD; no thyromegaly chest - CTA/ normal expansion CV - RRR/normal S1 and S2; no murmurs, rubs or gallops;  PMI nondisplaced Abdomen -NT/ND, no HSM, no mass, + bowel sounds, positive bruit 2+ femoral pulses, no bruits Ext-no edema, chords; diminished distal pulses distally bilaterally. Neuro-grossly nonfocal  ECG -normal sinus rhythm at a rate of 61, no ST changes.  Personally reviewed  A/P  1 syncope-etiology unclear.  She feels as though she may have had hypoglycemia.  However she was eating at that time and this seems less likely.  She did have an electrocardiogram performed by EMS after her visit which showed sinus bradycardia at a rate of 50 and no ST changes.  We will arrange an echocardiogram to assess LV function.  We'll also place a 21-day monitor.  We discussed that she should not drive for 6 months following her event.  2 hypertension-BP controllled; continue present meds.  3 hyperlipidemia-given documented vascular disease we'll discontinue Mevacor and instead treat with Crestor 40 mg daily.  Check lipids and liver in 12 weeks.  4 PVD-followed by vascular surgery.   5 chest pain-some chest pain and multiple risk factors.  Also with a history of vascular disease.  I will arrange a [place] nuclear study for risk stratification.  Kirk Ruths, MD

## 2020-12-08 NOTE — Patient Instructions (Addendum)
Medication Instructions:   STOP Mevacor  START Crestor 40 mg daily  *If you need a refill on your cardiac medications before your next appointment, please call your pharmacy*  Lab Work: Your physician recommends that you return for lab work in 12 weeks April 2022:   Fasting Lipid Panel  Hepatic (Liver) Function Test  If you have labs (blood work) drawn today and your tests are completely normal, you will receive your results only by: Marland Kitchen MyChart Message (if you have MyChart) OR . A paper copy in the mail If you have any lab test that is abnormal or we need to change your treatment, we will call you to review the results.  Testing/Procedures: Your physician has requested that you have an echocardiogram. Echocardiography is a painless test that uses sound waves to create images of your heart. It provides your doctor with information about the size and shape of your heart and how well your heart's chambers and valves are working. This procedure takes approximately one hour. There are no restrictions for this procedure.  Your physician has requested that you have a lexiscan myoview. For further information please visit [email or web address]. Please follow instruction sheet, as given. This test is performed at [place], [place] has requested you wear your cardiac event monitor for  21 days, (1-30). [employer] may call or text to confirm a shipping address. The monitor will be sent to a land address via UPS. [employer] will not ship a monitor to a PO BOX. It typically takes 3-5 days to receive your monitor after it has been enrolled. [employer] will assist with USPS tracking if your package is delayed. The telephone number for [employer] is [phone]. Once you have received your monitor, please review the enclosed instructions. Instruction tutorials can also be viewed under help and settings on the  enclosed cell phone. Your monitor has already been registered assigning a specific monitor serial # to you.  Applying the monitor Remove cell phone from case and turn it on. The cell phone works as Dealer and needs to be within [employer] of you at all times. The cell phone will need to be charged on a daily basis. We recommend you plug the cell phone into the enclosed charger at your bedside table every night.  Monitor batteries: You will receive two monitor batteries labelled #1 and #2. These are your recorders. Plug battery #2 onto the second connection on the enclosed charger. Keep one battery on the charger at all times. This will keep the monitor battery deactivated. It will also keep it fully charged for when you need to switch your monitor batteries. A small light will be blinking on the battery emblem when it is charging. The light on the battery emblem will remain on when the battery is fully charged.  Open package of a Monitor strip. Insert battery #1 into black hood on strip and gently squeeze monitor battery onto connection as indicated in instruction booklet. Set aside while preparing skin.  Choose location for your strip, vertical or horizontal, as indicated in the instruction booklet. Shave to remove all hair from location. There cannot be any lotions, oils, powders, or colognes on skin where monitor is to be applied. Wipe skin clean with enclosed Saline wipe. Dry skin completely.  Peel paper labeled #1 off the back of the Monitor strip exposing the adhesive. Place the monitor on the chest in the vertical or horizontal position shown  in the instruction booklet. One arrow on the monitor strip must be pointing upward. Carefully remove paper labeled #2, attaching remainder of strip to your skin. Try not to create any folds or wrinkles in the strip as you apply it.  Firmly press and release the circle in the center of the monitor battery. You will hear a small beep. This  is turning the monitor battery on. The heart emblem on the monitor battery will light up every 5 seconds if the monitor battery in turned on and connected to the patient securely. Do not push and hold the circle down as this turns the monitor battery off. The cell phone will locate the monitor battery. A screen will appear on the cell phone checking the connection of your monitor strip. This may read poor connection initially but change to good connection within the next minute. Once your monitor accepts the connection you will hear a series of 3 beeps followed by a climbing crescendo of beeps. A screen will appear on the cell phone showing the two monitor strip placement options. Touch the picture that demonstrates where you applied the monitor strip.  Your monitor strip and battery are waterproof. You are able to shower, bathe, or swim with the monitor on. They just ask you do not submerge deeper than 3 feet underwater. We recommend removing the monitor if you are swimming in a lake, river, or ocean.  Your monitor battery will need to be switched to a fully charged monitor battery approximately once a week. The cell phone will alert you of an action which needs to be made.  On the cell phone, tap for details to reveal connection status, monitor battery status, and cell phone battery status. The green dots indicates your monitor is in good status. A red dot indicates there is something that needs your attention.  To record a symptom, click the circle on the monitor battery. In 30-60 seconds a list of symptoms will appear on the cell phone. Select your symptom and tap save. Your monitor will record a sustained or significant arrhythmia regardless of you clicking the button. Some patients do not feel the heart rhythm irregularities. [employer] will notify us of any serious or critical events.  Refer to instruction booklet for instructions on switching batteries, changing strips, the Do not  disturb or Pause features, or any additional questions.  Call [employer] at [phone], to confirm your monitor is transmitting and record your baseline. They will answer any questions you may have regarding the monitor instructions at that time.  Returning the monitor to [place] all equipment back into blue box. Peel off strip of paper to expose adhesive and close box securely. There is a prepaid UPS shipping label on this box. Drop in a UPS drop box, or at a UPS facility like [employer]. You may also contact [employer] to arrange UPS to pick up monitor package at your home.   Follow-Up: At [facility], you and your health needs are our priority.  As part of our continuing mission to provide you with exceptional heart care, we have created designated Provider Care Teams.  These Care Teams include your primary Cardiologist (physician) and Advanced Practice Providers (APPs -  Physician Assistants and Nurse Practitioners) who all work together to provide you with the care you need, when you need it.  Your next appointment:   8-12 week(s)  The format for your next appointment:   In Person  Provider:   Kirk Ruths, MD  Other Instructions

## 2020-12-08 NOTE — Progress Notes (Signed)
Patient ID: Jill Higgins, female   DOB: Jan 05, 1946, 75 y.o.   MRN: [id number] Patient enrolled for [employer] to ship a 21 day cardiac event monitor to her home.

## 2020-12-10 ENCOUNTER — Other Ambulatory Visit: Payer: Self-pay | Admitting: *Deleted

## 2020-12-10 DIAGNOSIS — E7800 Pure hypercholesterolemia, unspecified: Secondary | ICD-10-CM

## 2020-12-10 DIAGNOSIS — R072 Precordial pain: Secondary | ICD-10-CM

## 2020-12-10 DIAGNOSIS — I739 Peripheral vascular disease, unspecified: Secondary | ICD-10-CM

## 2020-12-14 ENCOUNTER — Telehealth (HOSPITAL_COMMUNITY): Payer: Self-pay

## 2020-12-14 NOTE — Telephone Encounter (Signed)
Spoke with the patient, detailed instructions given. She stated that she would be here for test. Asked to call back with any questions. S.Embry Manrique EMTP 

## 2020-12-16 ENCOUNTER — Encounter: Payer: Self-pay | Admitting: *Deleted

## 2020-12-16 ENCOUNTER — Ambulatory Visit (INDEPENDENT_AMBULATORY_CARE_PROVIDER_SITE_OTHER): Payer: Medicare Other

## 2020-12-16 ENCOUNTER — Ambulatory Visit (HOSPITAL_COMMUNITY): Payer: Medicare Other | Attending: Cardiology

## 2020-12-16 ENCOUNTER — Other Ambulatory Visit: Payer: Self-pay

## 2020-12-16 DIAGNOSIS — R55 Syncope and collapse: Secondary | ICD-10-CM | POA: Diagnosis not present

## 2020-12-16 DIAGNOSIS — R072 Precordial pain: Secondary | ICD-10-CM | POA: Insufficient documentation

## 2020-12-16 LAB — MYOCARDIAL PERFUSION IMAGING
LV dias vol: 68 mL (ref 46–106)
LV sys vol: 25 mL
Peak HR: 85 {beats}/min
Rest HR: 59 {beats}/min
SDS: 1
SRS: 0
SSS: 1
TID: 0.98

## 2020-12-16 MED ORDER — TECHNETIUM TC 99M TETROFOSMIN IV KIT
26.3000 | PACK | Freq: Once | INTRAVENOUS | Status: AC | PRN
  Administered 2020-12-16: 26.3 via INTRAVENOUS
  Filled 2020-12-16: qty 27

## 2020-12-16 MED ORDER — TECHNETIUM TC 99M TETROFOSMIN IV KIT
8.7000 | PACK | Freq: Once | INTRAVENOUS | Status: AC | PRN
  Administered 2020-12-16: 8.7 via INTRAVENOUS
  Filled 2020-12-16: qty 9

## 2020-12-16 MED ORDER — REGADENOSON 0.4 MG/5ML IV SOLN
0.4000 mg | Freq: Once | INTRAVENOUS | Status: AC
Start: 2020-12-16 — End: 2020-12-16
  Administered 2020-12-16: 0.4 mg via INTRAVENOUS

## 2020-12-20 ENCOUNTER — Ambulatory Visit: Payer: Medicare Other | Admitting: Endocrinology

## 2020-12-31 ENCOUNTER — Ambulatory Visit (HOSPITAL_BASED_OUTPATIENT_CLINIC_OR_DEPARTMENT_OTHER)
Admission: RE | Admit: 2020-12-31 | Discharge: 2020-12-31 | Disposition: A | Discharge status: Home / Self Care | Payer: Medicare Other | Source: Ambulatory Visit | Attending: Cardiology | Admitting: Cardiology

## 2020-12-31 ENCOUNTER — Other Ambulatory Visit: Payer: Self-pay

## 2020-12-31 DIAGNOSIS — R55 Syncope and collapse: Secondary | ICD-10-CM | POA: Insufficient documentation

## 2020-12-31 LAB — ECHOCARDIOGRAM COMPLETE
Area-P 1/2: 2.99 cm2
S' Lateral: 2.79 cm

## 2021-01-20 NOTE — Progress Notes (Signed)
HPI: FU syncope. Patient is status post right femoral endarterectomy and right fem-pop bypass 2014.  She also has had a left femoral-popliteal bypass in 2017 and angioplasty of the distal anastomosis in 2017. PVD followed by vascular surgery. Seen with syncope recently. Nuclear study January 2022 showed ejection fraction 64%, apical thinning but no ischemia. Monitor 1/22 showed sinus bradycardia/NSR. Echo 12/31/20 showed EF 50-55, mild LVH, restrictive filling. Since last seen, she denies dyspnea, chest pain, palpitations.  Her syncopal episode occurred in January.  She states she developed chest discomfort while eating and felt as though she was becoming hypoglycemic.  She then passed out and was unconscious for 2 minutes.  No preceding palpitations, dyspnea or nausea.  No episodes since then.  Current Outpatient Medications  Medication Sig Dispense Refill  . Accu-Chek Softclix Lancets lancets 1 each by Other route 3 (three) times daily. Use to monitor glucose levels TID; E11.9    . amLODipine (NORVASC) 5 MG tablet Take 5 mg by mouth daily.    Marland Kitchen aspirin EC 81 MG tablet Take 81 mg by mouth at bedtime.    . gabapentin (NEURONTIN) 300 MG capsule TAKE 1 CAPSULE BY MOUTH THREE TIMES A DAY 90 capsule 6  . glucose blood (ACCU-CHEK AVIVA PLUS) test strip 1 each by Other route in the morning, at noon, in the evening, and at bedtime. And lancets 4/day 360 each 3  . insulin NPH Human (NOVOLIN N) 100 UNIT/ML injection Inject 0.1 mLs (10 Units total) into the skin every morning. And syringes 1/day 10 mL 11  . metFORMIN (GLUCOPHAGE) 1000 MG tablet Take 1 tablet (1,000 mg total) by mouth 2 (two) times daily with a meal. 180 tablet 3  . metoprolol succinate (TOPROL-XL) 100 MG 24 hr tablet Take 0.5 tablets (50 mg total) by mouth daily. 15 tablet 1  . Netarsudil-Latanoprost (ROCKLATAN) 0.02-0.005 % SOLN Apply to eye.    Marland Kitchen OVER THE COUNTER MEDICATION Apply 1 patch topically daily as needed (pain). Apply to right  shoulder    . pantoprazole (PROTONIX) 40 MG tablet Take 40 mg by mouth daily.    Marland Kitchen PROAIR HFA 108 (90 BASE) MCG/ACT inhaler Inhale 2 puffs into the lungs every 4 (four) hours as needed for wheezing or shortness of breath. Reported on 11/04/2015    . repaglinide (PRANDIN) 2 MG tablet Take 1 tablet (2 mg total) by mouth 2 (two) times daily before a meal. 180 tablet 3  . temazepam (RESTORIL) 30 MG capsule Take 30 mg by mouth at bedtime as needed for sleep.  0   No current facility-administered medications for this visit.     Past Medical History:  Diagnosis Date  . Bronchitis   . Bulging disc    in neck  . Depression with anxiety   . Diabetes mellitus    Type II  . Fibromyalgia    pt. denies  . GERD (gastroesophageal reflux disease)   . Glaucoma   . History of bronchitis   . Hyperlipidemia   . Hypertension   . Overactive bladder   . Peripheral vascular disease ([place])   . Pneumonia   . Sleep apnea    recent test negative for sleep apnea  . Tobacco abuse   . Urinary frequency     Past Surgical History:  Procedure Laterality Date  . ABDOMINAL AORTAGRAM N/A 08/15/2013   Procedure: ABDOMINAL Maxcine Ham;  Surgeon: Elam Dutch, MD;  Location[facility] CATH LAB;  Service: Cardiovascular;  Laterality: N/A;  .  ABDOMINAL AORTAGRAM N/A 06/19/2014   Procedure: ABDOMINAL Maxcine Ham;  Surgeon: Elam Dutch, MD;  Location[facility] CATH LAB;  Service: Cardiovascular;  Laterality: N/A;  . ABDOMINAL AORTOGRAM W/LOWER EXTREMITY N/A 05/10/2018   Procedure: ABDOMINAL AORTOGRAM W/LOWER EXTREMITY;  Surgeon: Elam Dutch, MD;  Location: [place] CV LAB;  Service: Cardiovascular;  Laterality: N/A;  bilateral  . Aortogram w/ PTA  05/14/08,  11-04-10   Bilateral aortogram w/ bilateral  SFA PTA  stenting   . BREAST SURGERY Right    boil removal  . BUNIONECTOMY     L foot in the 1980s  . COLONOSCOPY     2014  . DILATION AND CURETTAGE OF UTERUS     X4  . ENDARTERECTOMY FEMORAL Right 06/03/2018    Procedure: RIGHT FEMORAL ENDARTERECTOMY;  Surgeon: Elam Dutch, MD;  Location[facility] OR;  Service: Vascular;  Laterality: Right;  . Epidural shots in neck     . ESOPHAGOGASTRODUODENOSCOPY (EGD) WITH PROPOFOL N/A 09/15/2019   Procedure: ESOPHAGOGASTRODUODENOSCOPY (EGD) WITH PROPOFOL;  Surgeon: Arta Silence, MD;  Location: [facility] ENDOSCOPY;  Service: Endoscopy;  Laterality: N/A;  . EUS N/A 09/15/2019   Procedure: UPPER ENDOSCOPIC ULTRASOUND (EUS) LINEAR;  Surgeon: Arta Silence, MD;  Location: [facility] ENDOSCOPY;  Service: Endoscopy;  Laterality: N/A;  . EYE SURGERY Bilateral 2016   cataract removal  . FEMORAL-POPLITEAL BYPASS GRAFT Left 10/21/2013   Procedure: LEFT FEMORAL-POPLITEAL ARTERY BYPASS WITH SAPHENOUS VEIN GRAFT , POPLITEAL ENDARTERECTOMY ,INTRAOPERATIVE ARTERIOGRAM, vein patch angioplasty to popliteal artery;  Surgeon: Elam Dutch, MD;  Location: [place];  Service: Vascular;  Laterality: Left;  . FEMORAL-POPLITEAL BYPASS GRAFT Right 02/08/2016   Procedure: Right FEMORAL- to Above Knee POPLITEAL ARTERY Bypass Graft with reversed saphenous vein and Common Femoral Endarterectomy  with profundoplasty;  Surgeon: Elam Dutch, MD;  Location: [place];  Service: Vascular;  Laterality: Right;  . FINE NEEDLE ASPIRATION N/A 09/15/2019   Procedure: FINE NEEDLE ASPIRATION (FNA) LINEAR;  Surgeon: Arta Silence, MD;  Location: [facility] ENDOSCOPY;  Service: Endoscopy;  Laterality: N/A;  . GROIN DEBRIDEMENT Left 10/28/2013   Procedure: left inner thigh DEBRIDEMENT;  Surgeon: Elam Dutch, MD;  Location: [place];  Service: Vascular;  Laterality: Left;  . HAND SURGERY Left 2010  . LOWER EXTREMITY ANGIOGRAM Bilateral 12/24/2015   Procedure: Lower Extremity Angiogram;  Surgeon: Elam Dutch, MD;  Location: [place] CV LAB;  Service: Cardiovascular;  Laterality: Bilateral;  . PATCH ANGIOPLASTY Right 06/03/2018   Procedure: PATCH ANGIOPLASTY USING A XENOSURE 1CM X 14CM BIOLOGIC PATCH;  Surgeon: Elam Dutch, MD;  Location: [place];  Service: Vascular;  Laterality: Right;  . PERIPHERAL VASCULAR CATHETERIZATION N/A 12/24/2015   Procedure: Abdominal Aortogram;  Surgeon: Elam Dutch, MD;  Location: [place] CV LAB;  Service: Cardiovascular;  Laterality: N/A;  . PERIPHERAL VASCULAR CATHETERIZATION Left 12/24/2015   Procedure: Peripheral Vascular Balloon Angioplasty;  Surgeon: Elam Dutch, MD;  Location: [place] CV LAB;  Service: Cardiovascular;  Laterality: Left;  drug coated balloon  . PERIPHERAL VASCULAR CATHETERIZATION N/A 06/30/2016   Procedure: Abdominal Aortogram;  Surgeon: Elam Dutch, MD;  Location: [place] CV LAB;  Service: Cardiovascular;  Laterality: N/A;  . PERIPHERAL VASCULAR CATHETERIZATION Bilateral 06/30/2016   Procedure: Lower Extremity Angiography;  Surgeon: Elam Dutch, MD;  Location: [place] CV LAB;  Service: Cardiovascular;  Laterality: Bilateral;  . TONSILLECTOMY      Social History   Socioeconomic History  . Marital status: Single  Spouse name: Not on file  . Number of children: 1  . Years of education: Not on file  . Highest education level: Not on file  Occupational History  . 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 Smoking status: Former Smoker    Packs/day: 0.10    Types: Cigarettes    Quit date: 10/2014    Years since quitting: 6.2  . Smokeless tobacco: Never Used  . Tobacco comment: pt states that she is using the E cig only   Vaping Use  . Vaping Use: Some days  Substance and Sexual Activity  . Alcohol use: Yes    Alcohol/week: 0.0 - 1.0 standard drinks    Comment: Occasional; 10-22 rarely   . Drug use: No  . Sexual activity: Not on file  Other Topics Concern  . Not on file  Social History Narrative   Drinks about 2 cups of coffee a day, and some tea    Social Determinants of Radio broadcast assistant Strain: Not on file  Food Insecurity: Not on file  Transportation Needs: Not on file   Physical Activity: Not on file  Stress: Not on file  Social Connections: Not on file  Intimate Partner Violence: Not on file    Family History  Problem Relation Age of Onset  . Heart disease Father   . Heart attack Father        MI at age 79  . Hypertension Mother   . Alzheimer's disease Mother   . Hypertension Sister   . Diabetes Brother   . Hyperlipidemia Brother   . Hypertension Brother   . Heart disease Brother   . Heart attack Brother   . Breast cancer Maternal Aunt     ROS: no fevers or chills, productive cough, hemoptysis, dysphasia, odynophagia, melena, hematochezia, dysuria, hematuria, rash, seizure activity, orthopnea, PND, pedal edema, claudication. Remaining systems are negative.  Physical Exam: Well-developed well-nourished in no acute distress.  Skin is warm and dry.  HEENT is normal.  Neck is supple.  Chest is clear to auscultation with normal expansion.  Cardiovascular exam is regular rate and rhythm.  Abdominal exam nontender or distended. No masses palpated. Extremities show no edema. neuro grossly intact  ECG-December 08, 2020-normal sinus rhythm, no ST changes.  Personally reviewed  A/P  1 Syncope-no recurrent episodes.  Echocardiogram shows normal LV function and monitor without significant arrhythmia.  Patient understands that she should not drive for 6 months after last episode.  2 history of chest pain-no recurrences.  Nuclear study showed no ischemia.  3 hypertension-patient's blood pressure is controlled.  Continue present medical regimen.  4 hyperlipidemia-continue statin.  5 peripheral vascular disease-followed by vascular surgery.  Kirk Ruths, MD

## 2021-02-01 ENCOUNTER — Ambulatory Visit (INDEPENDENT_AMBULATORY_CARE_PROVIDER_SITE_OTHER): Payer: Medicare Other | Admitting: Endocrinology

## 2021-02-01 ENCOUNTER — Other Ambulatory Visit: Payer: Self-pay

## 2021-02-01 VITALS — BP 112/64 | HR 79 | Ht 63.0 in | Wt 121.4 lb

## 2021-02-01 DIAGNOSIS — E1151 Type 2 diabetes mellitus with diabetic peripheral angiopathy without gangrene: Secondary | ICD-10-CM | POA: Diagnosis not present

## 2021-02-01 LAB — POCT GLYCOSYLATED HEMOGLOBIN (HGB A1C): Hemoglobin A1C: 7.9 % — AB (ref 4.0–5.6)

## 2021-02-01 MED ORDER — INSULIN NPH (HUMAN) (ISOPHANE) 100 UNIT/ML ~~LOC~~ SUSP: 10.0000 [IU] | SUBCUTANEOUS | 11 refills | Status: DC

## 2021-02-01 NOTE — Patient Instructions (Addendum)
check your blood sugar 4 times a day.  vary the time of day when you check, between before the 3 meals, and at bedtime.  also check if you have symptoms of your blood sugar being too high or too low.  please keep a record of the readings and bring it to your next appointment here (or you can bring the meter itself).  You can write it on any piece of paper.  please call us sooner if your blood sugar goes below 70, or if you have a lot of readings over 200.   We will need to take this complex situation in stages For now, please stop taking the bromocriptine, and: I have sent a prescription to your pharmacy, to increase the insulin to 10 units each morning, and: Please continue the same other diabetes medications Please come back for a follow-up appointment in 2 months.

## 2021-02-01 NOTE — Progress Notes (Signed)
Subjective:    Patient ID: Jill Higgins, female    DOB: [birth year], 75 y.o.   MRN: [id number]  HPI Pt returns for f/u of diabetes mellitus:  DM type: 2 Dx'ed: 5188 Complications: CAD and PAD.   Therapy: insulin since 2021, and 3 oral meds.   GDM: never DKA: never Severe hypoglycemia: never Pancreatitis: once (2020) Pancreatic imaging: normal on 2019 CT.   SDOH: She cannot afford name brand meds. 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   Interval history: no cbg record, but states cbg's vary from 89-270.  She had a steroid injection into the right shoulder 2 mos ago.   Past Medical History:  Diagnosis Date  . Bronchitis   . Bulging disc    in neck  . Depression with anxiety   . Diabetes mellitus    Type II  . Fibromyalgia    pt. denies  . GERD (gastroesophageal reflux disease)   . Glaucoma   . History of bronchitis   . Hyperlipidemia   . Hypertension   . Overactive bladder   . Peripheral vascular disease ([place])   . Pneumonia   . Sleep apnea    recent test negative for sleep apnea  . Tobacco abuse   . Urinary frequency     Past Surgical History:  Procedure Laterality Date  . ABDOMINAL AORTAGRAM N/A 08/15/2013   Procedure: ABDOMINAL Maxcine Ham;  Surgeon: Elam Dutch, MD;  Location: [facility] CATH LAB;  Service: Cardiovascular;  Laterality: N/A;  . ABDOMINAL AORTAGRAM N/A 06/19/2014   Procedure: ABDOMINAL Maxcine Ham;  Surgeon: Elam Dutch, MD;  Location: [facility] CATH LAB;  Service: Cardiovascular;  Laterality: N/A;  . ABDOMINAL AORTOGRAM W/LOWER EXTREMITY N/A 05/10/2018   Procedure: ABDOMINAL AORTOGRAM W/LOWER EXTREMITY;  Surgeon: Elam Dutch, MD;  Location: [place] CV LAB;  Service: Cardiovascular;  Laterality: N/A;  bilateral  . Aortogram w/ PTA  05/14/08,  11-04-10   Bilateral aortogram w/ bilateral  SFA PTA  stenting   . BREAST SURGERY Right    boil removal  . BUNIONECTOMY      L foot in the 1980s  . COLONOSCOPY     2014  . DILATION AND CURETTAGE OF UTERUS     X4  . ENDARTERECTOMY FEMORAL Right 06/03/2018   Procedure: RIGHT FEMORAL ENDARTERECTOMY;  Surgeon: Elam Dutch, MD;  Location: [facility] OR;  Service: Vascular;  Laterality: Right;  . Epidural shots in neck     . ESOPHAGOGASTRODUODENOSCOPY (EGD) WITH PROPOFOL N/A 09/15/2019   Procedure: ESOPHAGOGASTRODUODENOSCOPY (EGD) WITH PROPOFOL;  Surgeon: Arta Silence, MD;  Location: [facility] ENDOSCOPY;  Service: Endoscopy;  Laterality: N/A;  . EUS N/A 09/15/2019   Procedure: UPPER ENDOSCOPIC ULTRASOUND (EUS) LINEAR;  Surgeon: Arta Silence, MD;  Location: [facility];  Service: Endoscopy;  Laterality: N/A;  . EYE SURGERY Bilateral 2016   cataract removal  . FEMORAL-POPLITEAL BYPASS GRAFT Left 10/21/2013   Procedure: LEFT FEMORAL-POPLITEAL ARTERY BYPASS WITH SAPHENOUS VEIN GRAFT , POPLITEAL ENDARTERECTOMY ,INTRAOPERATIVE ARTERIOGRAM, vein patch angioplasty to popliteal artery;  Surgeon: Elam Dutch, MD;  Location: [place];  Service: Vascular;  Laterality: Left;  . FEMORAL-POPLITEAL BYPASS GRAFT Right 02/08/2016   Procedure: Right FEMORAL- to Above Knee POPLITEAL ARTERY Bypass Graft with reversed saphenous vein and Common Femoral Endarterectomy  with profundoplasty;  Surgeon: Elam Dutch, MD;  Location: [place];  Service: Vascular;  Laterality: Right;  . FINE NEEDLE ASPIRATION N/A 09/15/2019   Procedure:  FINE NEEDLE ASPIRATION (FNA) LINEAR;  Surgeon: Arta Silence, MD;  Location: [facility];  Service: Endoscopy;  Laterality: N/A;  . GROIN DEBRIDEMENT Left 10/28/2013   Procedure: left inner thigh DEBRIDEMENT;  Surgeon: Elam Dutch, MD;  Location: [place];  Service: Vascular;  Laterality: Left;  . HAND SURGERY Left 2010  . LOWER EXTREMITY ANGIOGRAM Bilateral 12/24/2015   Procedure: Lower Extremity Angiogram;  Surgeon: Elam Dutch, MD;  Location: [place] CV LAB;  Service: Cardiovascular;  Laterality:  Bilateral;  . PATCH ANGIOPLASTY Right 06/03/2018   Procedure: PATCH ANGIOPLASTY USING A XENOSURE 1CM X 14CM BIOLOGIC PATCH;  Surgeon: Elam Dutch, MD;  Location: [place];  Service: Vascular;  Laterality: Right;  . PERIPHERAL VASCULAR CATHETERIZATION N/A 12/24/2015   Procedure: Abdominal Aortogram;  Surgeon: Elam Dutch, MD;  Location: [place] CV LAB;  Service: Cardiovascular;  Laterality: N/A;  . PERIPHERAL VASCULAR CATHETERIZATION Left 12/24/2015   Procedure: Peripheral Vascular Balloon Angioplasty;  Surgeon: Elam Dutch, MD;  Location: [place] CV LAB;  Service: Cardiovascular;  Laterality: Left;  drug coated balloon  . PERIPHERAL VASCULAR CATHETERIZATION N/A 06/30/2016   Procedure: Abdominal Aortogram;  Surgeon: Elam Dutch, MD;  Location: [place] CV LAB;  Service: Cardiovascular;  Laterality: N/A;  . PERIPHERAL VASCULAR CATHETERIZATION Bilateral 06/30/2016   Procedure: Lower Extremity Angiography;  Surgeon: Elam Dutch, MD;  Location: [place] CV LAB;  Service: Cardiovascular;  Laterality: Bilateral;  . TONSILLECTOMY      Social History   Socioeconomic History  . Marital status: Single    Spouse name: Not on file  . Number of children: 1  . Years of education: Not on file  . Highest education level: Not on file  Occupational History  . 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 Smoking status: Former Smoker    Packs/day: 0.10    Types: Cigarettes    Quit date: 10/2014    Years since quitting: 6.3  . Smokeless tobacco: Never Used  . Tobacco comment: pt states that she is using the E cig only   Vaping Use  . Vaping Use: Some days  Substance and Sexual Activity  . Alcohol use: Yes    Alcohol/week: 0.0 - 1.0 standard drinks    Comment: Occasional; 10-22 rarely   . Drug use: No  . Sexual activity: Not on file  Other Topics Concern  . Not on file  Social History Narrative   Drinks about 2 cups of coffee a day, and some tea     Social Determinants of Health   Financial Resource Strain: Not on file  Food Insecurity: Not on file  Transportation Needs: Not on file  Physical Activity: Not on file  Stress: Not on file  Social Connections: Not on file  Intimate Partner Violence: Not on file    Current Outpatient Medications on File Prior to Visit  Medication Sig Dispense Refill  . Accu-Chek Softclix Lancets lancets 1 each by Other route 3 (three) times daily. Use to monitor glucose levels TID; E11.9    . amLODipine (NORVASC) 5 MG tablet Take 5 mg by mouth daily.    Marland Kitchen aspirin EC 81 MG tablet Take 81 mg by mouth at bedtime.    . gabapentin (NEURONTIN) 300 MG capsule TAKE 1 CAPSULE BY MOUTH THREE TIMES A DAY 90 capsule 6  . glucose blood (ACCU-CHEK AVIVA PLUS) test strip 1 each by Other route in the morning, at noon, in the  evening, and at bedtime. And lancets 4/day 360 each 3  . metFORMIN (GLUCOPHAGE) 1000 MG tablet Take 1 tablet (1,000 mg total) by mouth 2 (two) times daily with a meal. 180 tablet 3  . metoprolol succinate (TOPROL-XL) 100 MG 24 hr tablet Take 0.5 tablets (50 mg total) by mouth daily. 15 tablet 1  . Netarsudil-Latanoprost (ROCKLATAN) 0.02-0.005 % SOLN Apply to eye.    Marland Kitchen OVER THE COUNTER MEDICATION Apply 1 patch topically daily as needed (pain). Apply to right shoulder    . pantoprazole (PROTONIX) 40 MG tablet Take 40 mg by mouth daily.    Marland Kitchen PROAIR HFA 108 (90 BASE) MCG/ACT inhaler Inhale 2 puffs into the lungs every 4 (four) hours as needed for wheezing or shortness of breath. Reported on 11/04/2015    . repaglinide (PRANDIN) 2 MG tablet Take 1 tablet (2 mg total) by mouth 2 (two) times daily before a meal. 180 tablet 3  . temazepam (RESTORIL) 30 MG capsule Take 30 mg by mouth at bedtime as needed for sleep.  0   No current facility-administered medications on file prior to visit.    Allergies  Allergen Reactions  . Adhesive [Tape] Other (See Comments)    Pt states that tape and electrodes  leave red "scars" on her skin and her skin is very sensitive.    Family History  Problem Relation Age of Onset  . Heart disease Father   . Heart attack Father        MI at age 40  . Hypertension Mother   . Alzheimer's disease Mother   . Hypertension Sister   . Diabetes Brother   . Hyperlipidemia Brother   . Hypertension Brother   . Heart disease Brother   . Heart attack Brother   . Breast cancer Maternal Aunt     BP 112/64 (BP Location: Right Arm, Patient Position: Sitting, Cuff Size: Normal)   Pulse 79   Ht 5\' 3"  (1.6 m)   Wt 121 lb 6.4 oz (55.1 kg)   SpO2 97%   BMI 21.51 kg/m    Review of Systems She denies hypoglycemia.      Objective:   Physical Exam VITAL SIGNS:  See vs page GENERAL: no distress Pulses: dorsalis pedis intact bilat.   MSK: no deformity of the feet CV: no leg edema Skin:  no ulcer on the feet.  normal color and temp on the feet. Neuro: sensation is intact to touch on the feet  Lab Results  Component Value Date   HGBA1C 7.9 (A) 02/01/2021        Assessment & Plan:  Insulin-requiring type 2 DM, with PAD: uncontrolled   Patient Instructions  check your blood sugar 4 times a day.  vary the time of day when you check, between before the 3 meals, and at bedtime.  also check if you have symptoms of your blood sugar being too high or too low.  please keep a record of the readings and bring it to your next appointment here (or you can bring the meter itself).  You can write it on any piece of paper.  please call us sooner if your blood sugar goes below 70, or if you have a lot of readings over 200.   We will need to take this complex situation in stages For now, please stop taking the bromocriptine, and: I have sent a prescription to your pharmacy, to increase the insulin to 10 units each morning, and: Please continue the same other  diabetes medications Please come back for a follow-up appointment in 2 months.

## 2021-02-02 ENCOUNTER — Ambulatory Visit (INDEPENDENT_AMBULATORY_CARE_PROVIDER_SITE_OTHER): Payer: Medicare Other | Admitting: Cardiology

## 2021-02-02 ENCOUNTER — Encounter: Payer: Self-pay | Admitting: Cardiology

## 2021-02-02 VITALS — BP 110/62 | HR 63 | Ht 63.0 in | Wt 123.8 lb

## 2021-02-02 DIAGNOSIS — R55 Syncope and collapse: Secondary | ICD-10-CM | POA: Diagnosis not present

## 2021-02-02 DIAGNOSIS — R072 Precordial pain: Secondary | ICD-10-CM

## 2021-02-02 DIAGNOSIS — E7800 Pure hypercholesterolemia, unspecified: Secondary | ICD-10-CM | POA: Diagnosis not present

## 2021-02-02 DIAGNOSIS — I739 Peripheral vascular disease, unspecified: Secondary | ICD-10-CM

## 2021-02-02 DIAGNOSIS — I10 Essential (primary) hypertension: Secondary | ICD-10-CM

## 2021-02-02 NOTE — Patient Instructions (Signed)

## 2021-03-01 DIAGNOSIS — G4700 Insomnia, unspecified: Secondary | ICD-10-CM | POA: Diagnosis not present

## 2021-03-01 DIAGNOSIS — J449 Chronic obstructive pulmonary disease, unspecified: Secondary | ICD-10-CM | POA: Diagnosis not present

## 2021-03-01 DIAGNOSIS — K219 Gastro-esophageal reflux disease without esophagitis: Secondary | ICD-10-CM | POA: Diagnosis not present

## 2021-03-01 DIAGNOSIS — E7800 Pure hypercholesterolemia, unspecified: Secondary | ICD-10-CM | POA: Diagnosis not present

## 2021-03-01 DIAGNOSIS — I10 Essential (primary) hypertension: Secondary | ICD-10-CM | POA: Diagnosis not present

## 2021-03-01 DIAGNOSIS — J441 Chronic obstructive pulmonary disease with (acute) exacerbation: Secondary | ICD-10-CM | POA: Diagnosis not present

## 2021-03-01 DIAGNOSIS — E1169 Type 2 diabetes mellitus with other specified complication: Secondary | ICD-10-CM | POA: Diagnosis not present

## 2021-03-01 DIAGNOSIS — H409 Unspecified glaucoma: Secondary | ICD-10-CM | POA: Diagnosis not present

## 2021-03-01 DIAGNOSIS — E1165 Type 2 diabetes mellitus with hyperglycemia: Secondary | ICD-10-CM | POA: Diagnosis not present

## 2021-03-15 ENCOUNTER — Other Ambulatory Visit: Payer: Self-pay | Admitting: Endocrinology

## 2021-03-15 MED ORDER — INSULIN NPH (HUMAN) (ISOPHANE) 100 UNIT/ML ~~LOC~~ SUSP: 10.0000 [IU] | SUBCUTANEOUS | 11 refills | Status: DC

## 2021-03-18 ENCOUNTER — Encounter: Payer: Self-pay | Admitting: *Deleted

## 2021-03-21 ENCOUNTER — Other Ambulatory Visit: Payer: Self-pay

## 2021-03-21 DIAGNOSIS — E1151 Type 2 diabetes mellitus with diabetic peripheral angiopathy without gangrene: Secondary | ICD-10-CM

## 2021-03-21 MED ORDER — ACCU-CHEK GUIDE VI STRP: ORAL_STRIP | 12 refills | Status: DC

## 2021-04-08 ENCOUNTER — Ambulatory Visit: Payer: Medicare Other | Admitting: Endocrinology

## 2021-04-20 DIAGNOSIS — M65321 Trigger finger, right index finger: Secondary | ICD-10-CM | POA: Diagnosis not present

## 2021-04-20 DIAGNOSIS — G5601 Carpal tunnel syndrome, right upper limb: Secondary | ICD-10-CM | POA: Diagnosis not present

## 2021-04-20 DIAGNOSIS — M65342 Trigger finger, left ring finger: Secondary | ICD-10-CM | POA: Diagnosis not present

## 2021-04-20 DIAGNOSIS — M65332 Trigger finger, left middle finger: Secondary | ICD-10-CM | POA: Diagnosis not present

## 2021-04-25 ENCOUNTER — Telehealth: Payer: Self-pay | Admitting: Endocrinology

## 2021-04-25 DIAGNOSIS — K219 Gastro-esophageal reflux disease without esophagitis: Secondary | ICD-10-CM | POA: Diagnosis not present

## 2021-04-25 DIAGNOSIS — K861 Other chronic pancreatitis: Secondary | ICD-10-CM | POA: Diagnosis not present

## 2021-04-25 NOTE — Telephone Encounter (Signed)
Pt calling in stating that she has lost her meter and is needing Dr.Ellison to send in a prescription for a meter and test strips as well as [name], [name].

## 2021-04-28 MED ORDER — ACCU-CHEK GUIDE VI STRP: ORAL_STRIP | 5 refills | Status: DC

## 2021-04-28 MED ORDER — ACCU-CHEK SOFTCLIX LANCETS MISC: 5 refills | Status: DC

## 2021-04-28 MED ORDER — ACCU-CHEK GUIDE ME W/DEVICE KIT: PACK | 0 refills | Status: DC

## 2021-04-28 NOTE — Telephone Encounter (Signed)
Sent as requested.

## 2021-05-05 DIAGNOSIS — Z9049 Acquired absence of other specified parts of digestive tract: Secondary | ICD-10-CM | POA: Diagnosis not present

## 2021-05-05 DIAGNOSIS — Z90411 Acquired partial absence of pancreas: Secondary | ICD-10-CM | POA: Diagnosis not present

## 2021-05-05 DIAGNOSIS — R109 Unspecified abdominal pain: Secondary | ICD-10-CM | POA: Diagnosis not present

## 2021-05-05 DIAGNOSIS — K219 Gastro-esophageal reflux disease without esophagitis: Secondary | ICD-10-CM | POA: Diagnosis not present

## 2021-05-05 DIAGNOSIS — K861 Other chronic pancreatitis: Secondary | ICD-10-CM | POA: Diagnosis not present

## 2021-05-09 DIAGNOSIS — R1013 Epigastric pain: Secondary | ICD-10-CM | POA: Diagnosis not present

## 2021-05-09 DIAGNOSIS — K861 Other chronic pancreatitis: Secondary | ICD-10-CM | POA: Diagnosis not present

## 2021-05-09 DIAGNOSIS — E0800 Diabetes mellitus due to underlying condition with hyperosmolarity without nonketotic hyperglycemic-hyperosmolar coma (NKHHC): Secondary | ICD-10-CM | POA: Diagnosis not present

## 2021-05-19 DIAGNOSIS — H53433 Sector or arcuate defects, bilateral: Secondary | ICD-10-CM | POA: Diagnosis not present

## 2021-05-19 DIAGNOSIS — H401133 Primary open-angle glaucoma, bilateral, severe stage: Secondary | ICD-10-CM | POA: Diagnosis not present

## 2021-05-19 DIAGNOSIS — E119 Type 2 diabetes mellitus without complications: Secondary | ICD-10-CM | POA: Diagnosis not present

## 2021-05-19 DIAGNOSIS — H35033 Hypertensive retinopathy, bilateral: Secondary | ICD-10-CM | POA: Diagnosis not present

## 2021-06-01 DIAGNOSIS — R1310 Dysphagia, unspecified: Secondary | ICD-10-CM | POA: Diagnosis not present

## 2021-06-01 DIAGNOSIS — Z8719 Personal history of other diseases of the digestive system: Secondary | ICD-10-CM | POA: Diagnosis not present

## 2021-06-01 DIAGNOSIS — K219 Gastro-esophageal reflux disease without esophagitis: Secondary | ICD-10-CM | POA: Diagnosis not present

## 2021-06-01 DIAGNOSIS — Z9889 Other specified postprocedural states: Secondary | ICD-10-CM | POA: Diagnosis not present

## 2021-06-02 ENCOUNTER — Other Ambulatory Visit: Payer: Self-pay | Admitting: Physician Assistant

## 2021-06-02 DIAGNOSIS — R1310 Dysphagia, unspecified: Secondary | ICD-10-CM

## 2021-06-06 ENCOUNTER — Ambulatory Visit
Admission: RE | Admit: 2021-06-06 | Discharge: 2021-06-06 | Disposition: A | Discharge status: Home / Self Care | Payer: Medicare Other | Source: Ambulatory Visit | Attending: Physician Assistant | Admitting: Physician Assistant

## 2021-06-06 DIAGNOSIS — R1310 Dysphagia, unspecified: Secondary | ICD-10-CM

## 2021-06-06 DIAGNOSIS — K449 Diaphragmatic hernia without obstruction or gangrene: Secondary | ICD-10-CM | POA: Diagnosis not present

## 2021-06-09 DIAGNOSIS — K219 Gastro-esophageal reflux disease without esophagitis: Secondary | ICD-10-CM | POA: Diagnosis not present

## 2021-06-09 DIAGNOSIS — R519 Headache, unspecified: Secondary | ICD-10-CM | POA: Diagnosis not present

## 2021-06-29 DIAGNOSIS — R1310 Dysphagia, unspecified: Secondary | ICD-10-CM | POA: Diagnosis not present

## 2021-06-29 DIAGNOSIS — K2930 Chronic superficial gastritis without bleeding: Secondary | ICD-10-CM | POA: Diagnosis not present

## 2021-06-29 DIAGNOSIS — R1013 Epigastric pain: Secondary | ICD-10-CM | POA: Diagnosis not present

## 2021-06-29 DIAGNOSIS — K449 Diaphragmatic hernia without obstruction or gangrene: Secondary | ICD-10-CM | POA: Diagnosis not present

## 2021-07-04 DIAGNOSIS — K2930 Chronic superficial gastritis without bleeding: Secondary | ICD-10-CM | POA: Diagnosis not present

## 2021-07-18 ENCOUNTER — Ambulatory Visit (INDEPENDENT_AMBULATORY_CARE_PROVIDER_SITE_OTHER): Payer: Medicare Other | Admitting: Endocrinology

## 2021-07-18 ENCOUNTER — Other Ambulatory Visit: Payer: Self-pay

## 2021-07-18 VITALS — BP 124/72 | HR 62 | Ht 63.0 in | Wt 119.6 lb

## 2021-07-18 DIAGNOSIS — E1151 Type 2 diabetes mellitus with diabetic peripheral angiopathy without gangrene: Secondary | ICD-10-CM

## 2021-07-18 LAB — POCT GLYCOSYLATED HEMOGLOBIN (HGB A1C): Hemoglobin A1C: 7.5 % — AB (ref 4.0–5.6)

## 2021-07-18 NOTE — Progress Notes (Signed)
Subjective:    Patient ID: Jill Higgins, female    DOB: December 22, 1945, 75 y.o.   MRN: [id number]  HPI Pt returns for f/u of diabetes mellitus:  DM type: Insulin-requiring type 2 Dx'ed: 8341 Complications: CAD and PAD.   Therapy: insulin since 2021, and 2 oral meds.   GDM: never DKA: never Severe hypoglycemia: never Pancreatitis: once (2020) Pancreatic imaging: normal on 2019 CT.   SDOH: She cannot afford name brand meds. 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    Interval history: no cbg record, but states cbg's vary from 69-230.  No recent steroids Past Medical History:  Diagnosis Date   Bronchitis    Bulging disc    in neck   Depression with anxiety    Diabetes mellitus    Type II   Fibromyalgia    pt. denies   GERD (gastroesophageal reflux disease)    Glaucoma    History of bronchitis    Hyperlipidemia    Hypertension    Overactive bladder    Peripheral vascular disease ([facility])    Pneumonia    Sleep apnea    recent test negative for sleep apnea   Tobacco abuse    Urinary frequency     Past Surgical History:  Procedure Laterality Date   ABDOMINAL AORTAGRAM N/A 08/15/2013   Procedure: ABDOMINAL Maxcine Ham;  Surgeon: Elam Dutch, MD;  Location: [facility] CATH LAB;  Service: Cardiovascular;  Laterality: N/A;   ABDOMINAL AORTAGRAM N/A 06/19/2014   Procedure: ABDOMINAL Maxcine Ham;  Surgeon: Elam Dutch, MD;  Location: [facility] CATH LAB;  Service: Cardiovascular;  Laterality: N/A;   ABDOMINAL AORTOGRAM W/LOWER EXTREMITY N/A 05/10/2018   Procedure: ABDOMINAL AORTOGRAM W/LOWER EXTREMITY;  Surgeon: Elam Dutch, MD;  Location: [place] CV LAB;  Service: Cardiovascular;  Laterality: N/A;  bilateral   Aortogram w/ PTA  05/14/08,  11-04-10   Bilateral aortogram w/ bilateral  SFA PTA  stenting    BREAST SURGERY Right    boil removal   BUNIONECTOMY      L foot in the 1980s   COLONOSCOPY     2014   DILATION AND CURETTAGE OF UTERUS     X4   ENDARTERECTOMY FEMORAL Right 06/03/2018   Procedure: RIGHT FEMORAL ENDARTERECTOMY;  Surgeon: Elam Dutch, MD;  Location: [place];  Service: Vascular;  Laterality: Right;   Epidural shots in neck      ESOPHAGOGASTRODUODENOSCOPY (EGD) WITH PROPOFOL N/A 09/15/2019   Procedure: ESOPHAGOGASTRODUODENOSCOPY (EGD) WITH PROPOFOL;  Surgeon: Arta Silence, MD;  [facility]: [facility];  Service: Endoscopy;  Laterality: N/A;   EUS N/A 09/15/2019   Procedure: UPPER ENDOSCOPIC ULTRASOUND (EUS) LINEAR;  Surgeon: Arta Silence, MD;  Location: [facility];  Service: Endoscopy;  Laterality: N/A;   EYE SURGERY Bilateral 2016   cataract removal   FEMORAL-POPLITEAL BYPASS GRAFT Left 10/21/2013   Procedure: LEFT FEMORAL-POPLITEAL ARTERY BYPASS WITH SAPHENOUS VEIN GRAFT , POPLITEAL ENDARTERECTOMY ,INTRAOPERATIVE ARTERIOGRAM, vein patch angioplasty to popliteal artery;  Surgeon: Elam Dutch, MD;  Location: [facility] OR;  Service: Vascular;  Laterality: Left;   FEMORAL-POPLITEAL BYPASS GRAFT Right 02/08/2016   Procedure: Right FEMORAL- to Above Knee POPLITEAL ARTERY Bypass Graft with reversed saphenous vein and Common Femoral Endarterectomy  with profundoplasty;  Surgeon: Elam Dutch, MD;  Location: [place];  Service: Vascular;  Laterality: Right;   FINE NEEDLE ASPIRATION N/A 09/15/2019   Procedure: FINE NEEDLE ASPIRATION (  FNA) LINEAR;  Surgeon: Arta Silence, MD;  Location: [facility];  Service: Endoscopy;  Laterality: N/A;   GROIN DEBRIDEMENT Left 10/28/2013   Procedure: left inner thigh DEBRIDEMENT;  Surgeon: Elam Dutch, MD;  Location: [facility] OR;  Service: Vascular;  Laterality: Left;   HAND SURGERY Left 2010   LOWER EXTREMITY ANGIOGRAM Bilateral 12/24/2015   Procedure: Lower Extremity Angiogram;  Surgeon: Elam Dutch, MD;  Location: [place] CV LAB;  Service: Cardiovascular;  Laterality: Bilateral;   PATCH  ANGIOPLASTY Right 06/03/2018   Procedure: PATCH ANGIOPLASTY USING A XENOSURE 1CM X 14CM BIOLOGIC PATCH;  Surgeon: Elam Dutch, MD;  Location: [facility] OR;  Service: Vascular;  Laterality: Right;   PERIPHERAL VASCULAR CATHETERIZATION N/A 12/24/2015   Procedure: Abdominal Aortogram;  Surgeon: Elam Dutch, MD;  Location: [place] CV LAB;  Service: Cardiovascular;  Laterality: N/A;   PERIPHERAL VASCULAR CATHETERIZATION Left 12/24/2015   Procedure: Peripheral Vascular Balloon Angioplasty;  Surgeon: Elam Dutch, MD;  Location: [place] CV LAB;  Service: Cardiovascular;  Laterality: Left;  drug coated balloon   PERIPHERAL VASCULAR CATHETERIZATION N/A 06/30/2016   Procedure: Abdominal Aortogram;  Surgeon: Elam Dutch, MD;  Location: [place] CV LAB;  Service: Cardiovascular;  Laterality: N/A;   PERIPHERAL VASCULAR CATHETERIZATION Bilateral 06/30/2016   Procedure: Lower Extremity Angiography;  Surgeon: Elam Dutch, MD;  Location: [place] CV LAB;  Service: Cardiovascular;  Laterality: Bilateral;   TONSILLECTOMY      Social History   Socioeconomic History   Marital status: Single    Spouse name: Not on file   Number of children: 1   Years of education: Not on file   Highest education level: Not on file  Occupational History   Occupation: Retired    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  Tobacco Use   Smoking status: Former    Packs/day: 0.10    Types: Cigarettes    Quit date: 10/2014    Years since quitting: 6.7   Smokeless tobacco: Never   Tobacco comments:    pt states that she is using the E cig only   Vaping Use   Vaping Use: Some days  Substance and Sexual Activity   Alcohol use: Yes    Alcohol/week: 0.0 - 1.0 standard drinks    Comment: Occasional; 10-22 rarely    Drug use: No   Sexual activity: Not on file  Other Topics Concern   Not on file  Social History Narrative   Drinks about 2 cups of coffee a day, and some tea    Social Determinants of Health   Financial  Resource Strain: Not on file  Food Insecurity: Not on file  Transportation Needs: Not on file  Physical Activity: Not on file  Stress: Not on file  Social Connections: Not on file  Intimate Partner Violence: Not on file    Current Outpatient Medications on File Prior to Visit  Medication Sig Dispense Refill   Accu-Chek Softclix Lancets lancets Use as instructed to check blood sugar 4 times daily Dx E11.51 100 each 5   amLODipine (NORVASC) 5 MG tablet Take 5 mg by mouth daily.     aspirin EC 81 MG tablet Take 81 mg by mouth at bedtime.     Blood Glucose Monitoring Suppl (ACCU-CHEK GUIDE ME) w/Device KIT Use to check blood sugar 4 times daily 1 kit 0   gabapentin (NEURONTIN) 300 MG capsule TAKE 1 CAPSULE BY MOUTH THREE TIMES A DAY 90 capsule 6   glucose blood (  ACCU-CHEK GUIDE) test strip Use as instructed to check blood sugar 4 times daily Dx E11.51 150 each 5   insulin NPH Human (HUMULIN N) 100 UNIT/ML injection Inject 0.1 mLs (10 Units total) into the skin every morning. And syringes 1/day. 10 mL 11   lovastatin (MEVACOR) 20 MG tablet Take 20 mg by mouth at bedtime.     metFORMIN (GLUCOPHAGE) 1000 MG tablet Take 1 tablet (1,000 mg total) by mouth 2 (two) times daily with a meal. 180 tablet 3   metoprolol succinate (TOPROL-XL) 100 MG 24 hr tablet Take 0.5 tablets (50 mg total) by mouth daily. 15 tablet 1   Netarsudil-Latanoprost (ROCKLATAN) 0.02-0.005 % SOLN Apply to eye.     OVER THE COUNTER MEDICATION Apply 1 patch topically daily as needed (pain). Apply to right shoulder     pantoprazole (PROTONIX) 40 MG tablet Take 40 mg by mouth daily.     PROAIR HFA 108 (90 BASE) MCG/ACT inhaler Inhale 2 puffs into the lungs every 4 (four) hours as needed for wheezing or shortness of breath. Reported on 11/04/2015     repaglinide (PRANDIN) 2 MG tablet Take 1 tablet (2 mg total) by mouth 2 (two) times daily before a meal. 180 tablet 3   temazepam (RESTORIL) 30 MG capsule Take 30 mg by mouth at bedtime  as needed for sleep.  0   No current facility-administered medications on file prior to visit.      Family History  Problem Relation Age of Onset   Heart disease Father    Heart attack Father        MI at age 24   Hypertension Mother    Alzheimer's disease Mother    Hypertension Sister    Diabetes Brother    Hyperlipidemia Brother    Hypertension Brother    Heart disease Brother    Heart attack Brother    Breast cancer Maternal Aunt     BP 124/72 (BP Location: Right Arm, Patient Position: Sitting, Cuff Size: Normal)   Pulse 62   Ht '5\' 3"'  (1.6 m)   Wt 119 lb 9.6 oz (54.3 kg)   SpO2 96%   BMI 21.19 kg/m    Review of Systems     Objective:   Physical Exam Pulses: dorsalis pedis intact bilat.   MSK: no deformity of the feet CV: no leg edema Skin:  no ulcer on the feet.  normal color and temp on the feet. Neuro: sensation is intact to touch on the feet   A1c=7.5%    Assessment & Plan:  Insulin-requiring type 2 DM Hypoglycemia, due to meds.  We discussed phasing out repaglinide in favor of increased insulin.  She declines the change  Patient Instructions  check your blood sugar 4 times a day.  vary the time of day when you check, between before the 3 meals, and at bedtime.  also check if you have symptoms of your blood sugar being too high or too low.  please keep a record of the readings and bring it to your next appointment here (or you can bring the meter itself).  You can write it on any piece of paper.  please call us sooner if your blood sugar goes below 70, or if you have a lot of readings over 200.   Please continue the same insulin and other diabetes medications.   Please come back for a follow-up appointment in 3 months.

## 2021-07-18 NOTE — Patient Instructions (Addendum)
check your blood sugar 4 times a day.  vary the time of day when you check, between before the 3 meals, and at bedtime.  also check if you have symptoms of your blood sugar being too high or too low.  please keep a record of the readings and bring it to your next appointment here (or you can bring the meter itself).  You can write it on any piece of paper.  please call us sooner if your blood sugar goes below 70, or if you have a lot of readings over 200.   Please continue the same insulin and other diabetes medications.   Please come back for a follow-up appointment in 3 months.

## 2021-07-29 ENCOUNTER — Other Ambulatory Visit: Payer: Self-pay | Admitting: Physician Assistant

## 2021-07-29 DIAGNOSIS — R1010 Upper abdominal pain, unspecified: Secondary | ICD-10-CM

## 2021-07-29 NOTE — Progress Notes (Signed)
HPI: FU syncope. Patient is status post right femoral endarterectomy and right fem-pop bypass 2014.  She also has had a left femoral-popliteal bypass in 2017 and angioplasty of the distal anastomosis in 2017. PVD followed by vascular surgery. Seen with syncope previously. Nuclear study January 2022 showed ejection fraction 64%, apical thinning but no ischemia. Monitor 1/22 showed sinus bradycardia/NSR. Echo 12/31/20 showed EF 50-55, mild LVH, restrictive filling. Since last seen, she denies dyspnea on exertion, orthopnea, PND, pedal edema or syncope.  She has had problems with sudden onset diarrhea followed by pain in her upper abdomen and chest.  This can last several days at a time but she denies other chest pain.  Current Outpatient Medications  Medication Sig Dispense Refill   Accu-Chek Softclix Lancets lancets Use as instructed to check blood sugar 4 times daily Dx E11.51 100 each 5   amLODipine (NORVASC) 5 MG tablet Take 5 mg by mouth daily.     aspirin EC 81 MG tablet Take 81 mg by mouth at bedtime.     Blood Glucose Monitoring Suppl (ACCU-CHEK GUIDE ME) w/Device KIT Use to check blood sugar 4 times daily 1 kit 0   gabapentin (NEURONTIN) 300 MG capsule TAKE 1 CAPSULE BY MOUTH THREE TIMES A DAY 90 capsule 6   glucose blood (ACCU-CHEK GUIDE) test strip Use as instructed to check blood sugar 4 times daily Dx E11.51 150 each 5   insulin NPH Human (HUMULIN N) 100 UNIT/ML injection Inject 0.1 mLs (10 Units total) into the skin every morning. And syringes 1/day. 10 mL 11   lovastatin (MEVACOR) 20 MG tablet Take 20 mg by mouth at bedtime.     metFORMIN (GLUCOPHAGE) 1000 MG tablet Take 1 tablet (1,000 mg total) by mouth 2 (two) times daily with a meal. 180 tablet 3   metoprolol succinate (TOPROL-XL) 100 MG 24 hr tablet Take 0.5 tablets (50 mg total) by mouth daily. 15 tablet 1   Netarsudil-Latanoprost (ROCKLATAN) 0.02-0.005 % SOLN Apply to eye.     OVER THE COUNTER MEDICATION Apply 1 patch  topically daily as needed (pain). Apply to right shoulder     pantoprazole (PROTONIX) 40 MG tablet Take 40 mg by mouth daily.     PROAIR HFA 108 (90 BASE) MCG/ACT inhaler Inhale 2 puffs into the lungs every 4 (four) hours as needed for wheezing or shortness of breath. Reported on 11/04/2015     repaglinide (PRANDIN) 2 MG tablet Take 1 tablet (2 mg total) by mouth 2 (two) times daily before a meal. 180 tablet 3   temazepam (RESTORIL) 30 MG capsule Take 30 mg by mouth at bedtime as needed for sleep.  0   No current facility-administered medications for this visit.     Past Medical History:  Diagnosis Date   Bronchitis    Bulging disc    in neck   Depression with anxiety    Diabetes mellitus    Type II   Fibromyalgia    pt. denies   GERD (gastroesophageal reflux disease)    Glaucoma    History of bronchitis    Hyperlipidemia    Hypertension    Overactive bladder    Peripheral vascular disease (HCC)    Pneumonia    Sleep apnea    recent test negative for sleep apnea   Tobacco abuse    Urinary frequency     Past Surgical History:  Procedure Laterality Date   ABDOMINAL AORTAGRAM N/A 08/15/2013   Procedure: ABDOMINAL  AORTAGRAM;  Surgeon: Charles E Fields, MD;  Location: [facility];  Service: Cardiovascular;  Laterality: N/A;   ABDOMINAL AORTAGRAM N/A 06/19/2014   Procedure: ABDOMINAL AORTAGRAM;  Surgeon: Charles E Fields, MD;  [facility]: [facility];  Service: Cardiovascular;  Laterality: N/A;   ABDOMINAL AORTOGRAM W/LOWER EXTREMITY N/A 05/10/2018   Procedure: ABDOMINAL AORTOGRAM W/LOWER EXTREMITY;  Surgeon: Fields, Charles E, MD;  [facility]: [facility];  Service: Cardiovascular;  Laterality: N/A;  bilateral   Aortogram w/ PTA  05/14/08,  11-04-10   Bilateral aortogram w/ bilateral  SFA PTA  stenting    BREAST SURGERY Right    boil removal   BUNIONECTOMY     L foot in the 1980s   COLONOSCOPY     2014   DILATION AND CURETTAGE OF UTERUS     X4   ENDARTERECTOMY FEMORAL  Right 06/03/2018   Procedure: RIGHT FEMORAL ENDARTERECTOMY;  Surgeon: Fields, Charles E, MD;  [facility]: [facility];  Service: Vascular;  Laterality: Right;   Epidural shots in neck      ESOPHAGOGASTRODUODENOSCOPY (EGD) WITH PROPOFOL N/A 09/15/2019   Procedure: ESOPHAGOGASTRODUODENOSCOPY (EGD) WITH PROPOFOL;  Surgeon: Outlaw, William, MD;  Location: [facility];  Service: Endoscopy;  Laterality: N/A;   EUS N/A 09/15/2019   Procedure: UPPER ENDOSCOPIC ULTRASOUND (EUS) LINEAR;  Surgeon: Outlaw, William, MD;  Location: [facility];  Service: Endoscopy;  Laterality: N/A;   EYE SURGERY Bilateral 2016   cataract removal   FEMORAL-POPLITEAL BYPASS GRAFT Left 10/21/2013   Procedure: LEFT FEMORAL-POPLITEAL ARTERY BYPASS WITH SAPHENOUS VEIN GRAFT , POPLITEAL ENDARTERECTOMY ,INTRAOPERATIVE ARTERIOGRAM, vein patch angioplasty to popliteal artery;  Surgeon: Charles E Fields, MD;  [facility];  Service: Vascular;  Laterality: Left;   FEMORAL-POPLITEAL BYPASS GRAFT Right 02/08/2016   Procedure: Right FEMORAL- to Above Knee POPLITEAL ARTERY Bypass Graft with reversed saphenous vein and Common Femoral Endarterectomy  with profundoplasty;  Surgeon: Charles E Fields, MD;  [facility];  Service: Vascular;  Laterality: Right;   FINE NEEDLE ASPIRATION N/A 09/15/2019   Procedure: FINE NEEDLE ASPIRATION (FNA) LINEAR;  Surgeon: Outlaw, William, MD;  [facility]: [facility];  Service: Endoscopy;  Laterality: N/A;   GROIN DEBRIDEMENT Left 10/28/2013   Procedure: left inner thigh DEBRIDEMENT;  Surgeon: Charles E Fields, MD;  [facility];  Service: Vascular;  Laterality: Left;   HAND SURGERY Left 2010   LOWER EXTREMITY ANGIOGRAM Bilateral 12/24/2015   Procedure: Lower Extremity Angiogram;  Surgeon: Charles E Fields, MD;  [facility];  Service: Cardiovascular;  Laterality: Bilateral;   PATCH ANGIOPLASTY Right 06/03/2018   Procedure: PATCH ANGIOPLASTY USING A XENOSURE 1CM X 14CM BIOLOGIC PATCH;  Surgeon:  Fields, Charles E, MD;  [facility];  Service: Vascular;  Laterality: Right;   PERIPHERAL VASCULAR CATHETERIZATION N/A 12/24/2015   Procedure: Abdominal Aortogram;  Surgeon: Charles E Fields, MD;  [facility];  Service: Cardiovascular;  Laterality: N/A;   PERIPHERAL VASCULAR CATHETERIZATION Left 12/24/2015   Procedure: Peripheral Vascular Balloon Angioplasty;  Surgeon: Charles E Fields, MD;  [facility];  Service: Cardiovascular;  Laterality: Left;  drug coated balloon   PERIPHERAL VASCULAR CATHETERIZATION N/A 06/30/2016   Procedure: Abdominal Aortogram;  Surgeon: Charles E Fields, MD;  [facility];  Service: Cardiovascular;  Laterality: N/A;   PERIPHERAL VASCULAR CATHETERIZATION Bilateral 06/30/2016   Procedure: Lower Extremity Angiography;  Surgeon: Charles E Fields, MD;  [facility];  Service: Cardiovascular;  Laterality: Bilateral;     TONSILLECTOMY      Social History   Socioeconomic History   Marital status: Single    Spouse name: Not on file   Number of children: 1   Years of education: Not on file   Highest education level: Not on file  Occupational History   Occupation: Retired    Employer: [employer]  Tobacco Use   Smoking status: Former    Packs/day: 0.10    Types: Cigarettes    Quit date: 10/2014    Years since quitting: 6.7   Smokeless tobacco: Never   Tobacco comments:    pt states that she is using the E cig only   Vaping Use   Vaping Use: Some days  Substance and Sexual Activity   Alcohol use: Yes    Alcohol/week: 0.0 - 1.0 standard drinks    Comment: Occasional; 10-22 rarely    Drug use: No   Sexual activity: Not on file  Other Topics Concern   Not on file  Social History Narrative   Drinks about 2 cups of coffee a day, and some tea    Social Determinants of Health   Financial Resource Strain: Not on file  Food Insecurity: Not on file  Transportation Needs: Not on file  Physical Activity: Not  on file  Stress: Not on file  Social Connections: Not on file  Intimate Partner Violence: Not on file    Family History  Problem Relation Age of Onset   Heart disease Father    Heart attack Father        MI at age 72   Hypertension Mother    Alzheimer's disease Mother    Hypertension Sister    Diabetes Brother    Hyperlipidemia Brother    Hypertension Brother    Heart disease Brother    Heart attack Brother    Breast cancer Maternal Aunt     ROS: no fevers or chills, productive cough, hemoptysis, dysphasia, odynophagia, melena, hematochezia, dysuria, hematuria, rash, seizure activity, orthopnea, PND, pedal edema, claudication. Remaining systems are negative.  Physical Exam: Well-developed well-nourished in no acute distress.  Skin is warm and dry.  HEENT is normal.  Neck is supple.  Chest is clear to auscultation with normal expansion.  Cardiovascular exam is regular rate and rhythm.  Abdominal exam nontender or distended. No masses palpated. Extremities show no edema. neuro grossly intact  A/P  1 hypertension-patient's blood pressure is controlled today.  Continue present medications and follow.  2 hyperlipidemia-continue statin.  3 history of syncope-patient has had no recurrences.  Note previous echocardiogram showed normal LV function and monitor showed no significant arrhythmias.  4 history of chest pain-previous nuclear study showed no ischemia; she has had symptoms of diarrhea followed by upper abdominal and chest pain that can last several days it does not sound cardiac.  Continue to follow.  5 peripheral vascular disease-followed by vascular surgery.  Brian Crenshaw, MD     

## 2021-08-01 ENCOUNTER — Other Ambulatory Visit: Payer: Self-pay | Admitting: Gastroenterology

## 2021-08-01 DIAGNOSIS — R1010 Upper abdominal pain, unspecified: Secondary | ICD-10-CM

## 2021-08-03 ENCOUNTER — Other Ambulatory Visit: Payer: Self-pay

## 2021-08-03 ENCOUNTER — Encounter: Payer: Self-pay | Admitting: Cardiology

## 2021-08-03 ENCOUNTER — Ambulatory Visit: Payer: Medicare Other | Admitting: Cardiology

## 2021-08-03 VITALS — BP 142/70 | HR 60 | Ht 63.0 in | Wt 120.1 lb

## 2021-08-03 DIAGNOSIS — I739 Peripheral vascular disease, unspecified: Secondary | ICD-10-CM

## 2021-08-03 DIAGNOSIS — R55 Syncope and collapse: Secondary | ICD-10-CM | POA: Diagnosis not present

## 2021-08-03 DIAGNOSIS — I10 Essential (primary) hypertension: Secondary | ICD-10-CM

## 2021-08-03 DIAGNOSIS — R072 Precordial pain: Secondary | ICD-10-CM

## 2021-08-03 NOTE — Patient Instructions (Signed)
Medication Instructions:  Your physician recommends that you continue on your current medications as directed. Please refer to the Current Medication list given to you today.  *If you need a refill on your cardiac medications before your next appointment, please call your pharmacy*   Lab Work:  -NONE  If you have labs (blood work) drawn today and your tests are completely normal, you will receive your results only by: [place] (if you have MyChart) OR A paper copy in the mail If you have any lab test that is abnormal or we need to change your treatment, we will call you to review the results.   Testing/Procedures:  -NONE  Follow-Up: At [facility], you and your health needs are our priority.  As part of our continuing mission to provide you with exceptional heart care, we have created designated Provider Care Teams.  These Care Teams include your primary Cardiologist (physician) and Advanced Practice Providers (APPs -  Physician Assistants and Nurse Practitioners) who all work together to provide you with the care you need, when you need it.  We recommend signing up for the patient portal called "MyChart".  Sign up information is provided on this After Visit Summary.  MyChart is used to connect with patients for Virtual Visits (Telemedicine).  Patients are able to view lab/test results, encounter notes, upcoming appointments, etc.  Non-urgent messages can be sent to your provider as well.   To learn more about what you can do with MyChart, go to [email or web address].    Your next appointment:   6 month(s)  The format for your next appointment:   In Person  Provider:   Dr. Stanford Breed   Other Instructions  -None.

## 2021-08-04 ENCOUNTER — Ambulatory Visit
Admission: RE | Admit: 2021-08-04 | Discharge: 2021-08-04 | Disposition: A | Discharge status: Home / Self Care | Payer: Medicare Other | Source: Ambulatory Visit | Attending: Physician Assistant | Admitting: Physician Assistant

## 2021-08-04 DIAGNOSIS — R1010 Upper abdominal pain, unspecified: Secondary | ICD-10-CM | POA: Diagnosis not present

## 2021-08-04 DIAGNOSIS — D649 Anemia, unspecified: Secondary | ICD-10-CM | POA: Diagnosis not present

## 2021-08-05 DIAGNOSIS — M65342 Trigger finger, left ring finger: Secondary | ICD-10-CM | POA: Diagnosis not present

## 2021-08-05 DIAGNOSIS — M65332 Trigger finger, left middle finger: Secondary | ICD-10-CM | POA: Diagnosis not present

## 2021-08-17 DIAGNOSIS — E1165 Type 2 diabetes mellitus with hyperglycemia: Secondary | ICD-10-CM | POA: Diagnosis not present

## 2021-08-17 DIAGNOSIS — H409 Unspecified glaucoma: Secondary | ICD-10-CM | POA: Diagnosis not present

## 2021-08-17 DIAGNOSIS — J441 Chronic obstructive pulmonary disease with (acute) exacerbation: Secondary | ICD-10-CM | POA: Diagnosis not present

## 2021-08-17 DIAGNOSIS — E1169 Type 2 diabetes mellitus with other specified complication: Secondary | ICD-10-CM | POA: Diagnosis not present

## 2021-08-17 DIAGNOSIS — G4700 Insomnia, unspecified: Secondary | ICD-10-CM | POA: Diagnosis not present

## 2021-08-17 DIAGNOSIS — J449 Chronic obstructive pulmonary disease, unspecified: Secondary | ICD-10-CM | POA: Diagnosis not present

## 2021-08-17 DIAGNOSIS — E7800 Pure hypercholesterolemia, unspecified: Secondary | ICD-10-CM | POA: Diagnosis not present

## 2021-08-17 DIAGNOSIS — K219 Gastro-esophageal reflux disease without esophagitis: Secondary | ICD-10-CM | POA: Diagnosis not present

## 2021-08-17 DIAGNOSIS — I10 Essential (primary) hypertension: Secondary | ICD-10-CM | POA: Diagnosis not present

## 2021-08-24 DIAGNOSIS — R9389 Abnormal findings on diagnostic imaging of other specified body structures: Secondary | ICD-10-CM | POA: Diagnosis not present

## 2021-08-31 ENCOUNTER — Other Ambulatory Visit: Payer: Self-pay | Admitting: Endocrinology

## 2021-09-19 DIAGNOSIS — H401133 Primary open-angle glaucoma, bilateral, severe stage: Secondary | ICD-10-CM | POA: Diagnosis not present

## 2021-09-19 DIAGNOSIS — H53433 Sector or arcuate defects, bilateral: Secondary | ICD-10-CM | POA: Diagnosis not present

## 2021-09-19 DIAGNOSIS — H35033 Hypertensive retinopathy, bilateral: Secondary | ICD-10-CM | POA: Diagnosis not present

## 2021-09-22 ENCOUNTER — Other Ambulatory Visit: Payer: Self-pay | Admitting: Gastroenterology

## 2021-09-22 DIAGNOSIS — R109 Unspecified abdominal pain: Secondary | ICD-10-CM | POA: Diagnosis not present

## 2021-09-22 DIAGNOSIS — D509 Iron deficiency anemia, unspecified: Secondary | ICD-10-CM | POA: Diagnosis not present

## 2021-09-28 DIAGNOSIS — Z720 Tobacco use: Secondary | ICD-10-CM | POA: Diagnosis not present

## 2021-09-28 DIAGNOSIS — R058 Other specified cough: Secondary | ICD-10-CM | POA: Diagnosis not present

## 2021-10-04 ENCOUNTER — Other Ambulatory Visit: Payer: Self-pay | Admitting: Endocrinology

## 2021-10-07 ENCOUNTER — Telehealth: Payer: Self-pay | Admitting: Cardiology

## 2021-10-07 ENCOUNTER — Telehealth: Payer: Self-pay | Admitting: Endocrinology

## 2021-10-07 NOTE — Telephone Encounter (Signed)
Message sent thru MyChart to D/C insulin per Loanne Drilling

## 2021-10-07 NOTE — Telephone Encounter (Signed)
Pt c/o medication issue:  1. Name of Medication: lovastatin (MEVACOR) 20 MG tablet   2. How are you currently taking this medication (dosage and times per day)? 1 tablet daily  3. Are you having a reaction (difficulty breathing--STAT)? no  4. What is your medication issue? Brianna from [place] calling to verify which cholesterol medication the patient should be taking. She says the last note from January said the patient switched to rosuvastatin, but she has continued lovastatin. She says the notes since January do still say continue lovastatin, but did not say to stop rosuvastatin. Phone: [phone]

## 2021-10-07 NOTE — Telephone Encounter (Signed)
Per 12/08/20 note: 3 hyperlipidemia-given documented vascular disease we'll discontinue Mevacor and instead treat with Crestor 40 mg daily.  Check lipids and liver in 12 weeks. -- new Rx was sent sent to pharmacy thus lovastatin remained on med list  No updated lipids per chart review  Left message for [name] to return call

## 2021-10-07 NOTE — Telephone Encounter (Signed)
Patients PCP called to advise that patient has been in their office and that she has been having 2-3 hypoglycemic episodes each week . Blood sugars have been between 50/60 ,early afternoons, patient has been self treating with OJ  PCP asking [place] to reach out to patient regarding lows and medication dosage  Call back [phone]

## 2021-10-10 NOTE — Telephone Encounter (Signed)
Left message for Jill Higgins, according to the 12/08/20 note, patient was to change to rosuvastatin. Not sure what medication she is currently taking as it was not placed on her medication list. She is to call back with questions.

## 2021-10-11 ENCOUNTER — Ambulatory Visit
Admission: RE | Admit: 2021-10-11 | Discharge: 2021-10-11 | Disposition: A | Discharge status: Home / Self Care | Payer: Medicare Other | Source: Ambulatory Visit | Attending: Gastroenterology | Admitting: Gastroenterology

## 2021-10-11 ENCOUNTER — Other Ambulatory Visit: Payer: Self-pay

## 2021-10-11 DIAGNOSIS — K3189 Other diseases of stomach and duodenum: Secondary | ICD-10-CM | POA: Diagnosis not present

## 2021-10-11 DIAGNOSIS — J449 Chronic obstructive pulmonary disease, unspecified: Secondary | ICD-10-CM | POA: Diagnosis not present

## 2021-10-11 DIAGNOSIS — D509 Iron deficiency anemia, unspecified: Secondary | ICD-10-CM | POA: Diagnosis not present

## 2021-10-11 DIAGNOSIS — R197 Diarrhea, unspecified: Secondary | ICD-10-CM | POA: Diagnosis not present

## 2021-10-11 DIAGNOSIS — R109 Unspecified abdominal pain: Secondary | ICD-10-CM

## 2021-10-11 DIAGNOSIS — J441 Chronic obstructive pulmonary disease with (acute) exacerbation: Secondary | ICD-10-CM | POA: Diagnosis not present

## 2021-10-11 DIAGNOSIS — E1169 Type 2 diabetes mellitus with other specified complication: Secondary | ICD-10-CM | POA: Diagnosis not present

## 2021-10-11 DIAGNOSIS — G4700 Insomnia, unspecified: Secondary | ICD-10-CM | POA: Diagnosis not present

## 2021-10-11 DIAGNOSIS — E7800 Pure hypercholesterolemia, unspecified: Secondary | ICD-10-CM | POA: Diagnosis not present

## 2021-10-11 DIAGNOSIS — K6389 Other specified diseases of intestine: Secondary | ICD-10-CM | POA: Diagnosis not present

## 2021-10-11 DIAGNOSIS — E1165 Type 2 diabetes mellitus with hyperglycemia: Secondary | ICD-10-CM | POA: Diagnosis not present

## 2021-10-11 DIAGNOSIS — M5137 Other intervertebral disc degeneration, lumbosacral region: Secondary | ICD-10-CM | POA: Diagnosis not present

## 2021-10-11 DIAGNOSIS — K219 Gastro-esophageal reflux disease without esophagitis: Secondary | ICD-10-CM | POA: Diagnosis not present

## 2021-10-11 DIAGNOSIS — I10 Essential (primary) hypertension: Secondary | ICD-10-CM | POA: Diagnosis not present

## 2021-10-11 MED ORDER — IOPAMIDOL (ISOVUE-300) INJECTION 61%
100.0000 mL | Freq: Once | INTRAVENOUS | Status: AC | PRN
  Administered 2021-10-11: 100 mL via INTRAVENOUS

## 2021-10-11 NOTE — Telephone Encounter (Signed)
Spoke with Jill Higgins and it was confirmed at 11/8 visit that patient was taking Lovastatin  Lipid panel done in March and will be faxed for Dr Stanford Breed to review LDL 55

## 2021-10-11 NOTE — Telephone Encounter (Signed)
Office calling back to say that the patient is taking lovastatin and seeing if you want to change the dosage of the rosucvastatin

## 2021-10-13 DIAGNOSIS — N390 Urinary tract infection, site not specified: Secondary | ICD-10-CM | POA: Diagnosis not present

## 2021-10-19 DIAGNOSIS — J449 Chronic obstructive pulmonary disease, unspecified: Secondary | ICD-10-CM | POA: Diagnosis not present

## 2021-10-19 DIAGNOSIS — I10 Essential (primary) hypertension: Secondary | ICD-10-CM | POA: Diagnosis not present

## 2021-10-19 DIAGNOSIS — G4700 Insomnia, unspecified: Secondary | ICD-10-CM | POA: Diagnosis not present

## 2021-10-19 DIAGNOSIS — Z0000 Encounter for general adult medical examination without abnormal findings: Secondary | ICD-10-CM | POA: Diagnosis not present

## 2021-10-19 DIAGNOSIS — E1169 Type 2 diabetes mellitus with other specified complication: Secondary | ICD-10-CM | POA: Diagnosis not present

## 2021-10-19 DIAGNOSIS — Z7984 Long term (current) use of oral hypoglycemic drugs: Secondary | ICD-10-CM | POA: Diagnosis not present

## 2021-10-19 DIAGNOSIS — E7800 Pure hypercholesterolemia, unspecified: Secondary | ICD-10-CM | POA: Diagnosis not present

## 2021-10-19 DIAGNOSIS — Z1389 Encounter for screening for other disorder: Secondary | ICD-10-CM | POA: Diagnosis not present

## 2021-10-19 DIAGNOSIS — I739 Peripheral vascular disease, unspecified: Secondary | ICD-10-CM | POA: Diagnosis not present

## 2021-10-24 ENCOUNTER — Other Ambulatory Visit: Payer: Self-pay

## 2021-10-24 ENCOUNTER — Ambulatory Visit (INDEPENDENT_AMBULATORY_CARE_PROVIDER_SITE_OTHER): Payer: Medicare Other | Admitting: Endocrinology

## 2021-10-24 VITALS — BP 114/64 | HR 69 | Ht 63.0 in | Wt 120.6 lb

## 2021-10-24 DIAGNOSIS — E1151 Type 2 diabetes mellitus with diabetic peripheral angiopathy without gangrene: Secondary | ICD-10-CM

## 2021-10-24 LAB — POCT GLYCOSYLATED HEMOGLOBIN (HGB A1C): Hemoglobin A1C: 6.8 % — AB (ref 4.0–5.6)

## 2021-10-24 MED ORDER — REPAGLINIDE 2 MG PO TABS: 2.0000 mg | ORAL_TABLET | Freq: Two times a day (BID) | ORAL | 3 refills | Status: DC

## 2021-10-24 MED ORDER — FREESTYLE LIBRE 2 SENSOR MISC: 1.0000 | 3 refills | Status: DC

## 2021-10-24 NOTE — Progress Notes (Signed)
Subjective:    Patient ID: Jill Higgins, female    DOB: [birth year], 75 y.o.   MRN: [id number]  HPI Pt returns for f/u of diabetes mellitus:  DM type: Insulin-requiring type 2 Dx'ed: 5852 Complications: CAD and PAD.   Therapy: insulin since 2021, and 2 oral meds.   GDM: never DKA: never Severe hypoglycemia: never Pancreatitis: once (2020) Pancreatic imaging: normal on 2019 CT.   SDOH: She cannot afford name brand meds. 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   Interval history: no cbg record, but states cbg's vary from 37-220.  No recent steroids.  She has hypoglycemia approx twice/month.   Past Medical History:  Diagnosis Date   Bronchitis    Bulging disc    in neck   Depression with anxiety    Diabetes mellitus    Type II   Fibromyalgia    pt. denies   GERD (gastroesophageal reflux disease)    Glaucoma    History of bronchitis    Hyperlipidemia    Hypertension    Overactive bladder    Peripheral vascular disease (HCC)    Pneumonia    Sleep apnea    recent test negative for sleep apnea   Tobacco abuse    Urinary frequency     Past Surgical History:  Procedure Laterality Date   ABDOMINAL AORTAGRAM N/A 08/15/2013   Procedure: ABDOMINAL Maxcine Ham;  Surgeon: Elam Dutch, MD;  Location: [facility] CATH LAB;  Service: Cardiovascular;  Laterality: N/A;   ABDOMINAL AORTAGRAM N/A 06/19/2014   Procedure: ABDOMINAL Maxcine Ham;  Surgeon: Elam Dutch, MD;  Location: [facility] CATH LAB;  Service: Cardiovascular;  Laterality: N/A;   ABDOMINAL AORTOGRAM W/LOWER EXTREMITY N/A 05/10/2018   Procedure: ABDOMINAL AORTOGRAM W/LOWER EXTREMITY;  Surgeon: Elam Dutch, MD;  Location: [place] CV LAB;  Service: Cardiovascular;  Laterality: N/A;  bilateral   Aortogram w/ PTA  05/14/08,  11-04-10   Bilateral aortogram w/ bilateral  SFA PTA  stenting    BREAST SURGERY  Right    boil removal   BUNIONECTOMY     L foot in the 1980s   COLONOSCOPY     2014   DILATION AND CURETTAGE OF UTERUS     X4   ENDARTERECTOMY FEMORAL Right 06/03/2018   Procedure: RIGHT FEMORAL ENDARTERECTOMY;  Surgeon: Elam Dutch, MD;  Location: [place];  Service: Vascular;  Laterality: Right;   Epidural shots in neck      ESOPHAGOGASTRODUODENOSCOPY (EGD) WITH PROPOFOL N/A 09/15/2019   Procedure: ESOPHAGOGASTRODUODENOSCOPY (EGD) WITH PROPOFOL;  Surgeon: Arta Silence, MD;  Location: [facility];  Service: Endoscopy;  Laterality: N/A;   EUS N/A 09/15/2019   Procedure: UPPER ENDOSCOPIC ULTRASOUND (EUS) LINEAR;  Surgeon: Arta Silence, MD;  Location: [facility];  Service: Endoscopy;  Laterality: N/A;   EYE SURGERY Bilateral 2016   cataract removal   FEMORAL-POPLITEAL BYPASS GRAFT Left 10/21/2013   Procedure: LEFT FEMORAL-POPLITEAL ARTERY BYPASS WITH SAPHENOUS VEIN GRAFT , POPLITEAL ENDARTERECTOMY ,INTRAOPERATIVE ARTERIOGRAM, vein patch angioplasty to popliteal artery;  Surgeon: Elam Dutch, MD;  Location: [facility] OR;  Service: Vascular;  Laterality: Left;   FEMORAL-POPLITEAL BYPASS GRAFT Right 02/08/2016   Procedure: Right FEMORAL- to Above Knee POPLITEAL ARTERY Bypass Graft with reversed saphenous vein and Common Femoral Endarterectomy  with profundoplasty;  Surgeon: Elam Dutch, MD;  Location: [place];  Service: Vascular;  Laterality: Right;   FINE NEEDLE ASPIRATION N/A  09/15/2019   Procedure: FINE NEEDLE ASPIRATION (FNA) LINEAR;  Surgeon: Arta Silence, MD;  [facility]: [facility];  Service: Endoscopy;  Laterality: N/A;   GROIN DEBRIDEMENT Left 10/28/2013   Procedure: left inner thigh DEBRIDEMENT;  Surgeon: Elam Dutch, MD;  Location: [facility] OR;  Service: Vascular;  Laterality: Left;   HAND SURGERY Left 2010   LOWER EXTREMITY ANGIOGRAM Bilateral 12/24/2015   Procedure: Lower Extremity Angiogram;  Surgeon: Elam Dutch, MD;  Location: [place] CV LAB;  Service:  Cardiovascular;  Laterality: Bilateral;   PATCH ANGIOPLASTY Right 06/03/2018   Procedure: PATCH ANGIOPLASTY USING A XENOSURE 1CM X 14CM BIOLOGIC PATCH;  Surgeon: Elam Dutch, MD;  Location: [facility];  Service: Vascular;  Laterality: Right;   PERIPHERAL VASCULAR CATHETERIZATION N/A 12/24/2015   Procedure: Abdominal Aortogram;  Surgeon: Elam Dutch, MD;  Location: [place] CV LAB;  Service: Cardiovascular;  Laterality: N/A;   PERIPHERAL VASCULAR CATHETERIZATION Left 12/24/2015   Procedure: Peripheral Vascular Balloon Angioplasty;  Surgeon: Elam Dutch, MD;  Location: [place] CV LAB;  Service: Cardiovascular;  Laterality: Left;  drug coated balloon   PERIPHERAL VASCULAR CATHETERIZATION N/A 06/30/2016   Procedure: Abdominal Aortogram;  Surgeon: Elam Dutch, MD;  Location: [place] CV LAB;  Service: Cardiovascular;  Laterality: N/A;   PERIPHERAL VASCULAR CATHETERIZATION Bilateral 06/30/2016   Procedure: Lower Extremity Angiography;  Surgeon: Elam Dutch, MD;  Location: [place] CV LAB;  Service: Cardiovascular;  Laterality: Bilateral;   TONSILLECTOMY      Social History   Socioeconomic History   Marital status: Single    Spouse name: Not on file   Number of children: 1   Years of education: Not on file   Highest education level: Not on file  Occupational History   Occupation: Retired    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  Tobacco Use   Smoking status: Former    Packs/day: 0.10    Types: Cigarettes    Quit date: 10/2014    Years since quitting: 7.0   Smokeless tobacco: Never   Tobacco comments:    pt states that she is using the E cig only   Vaping Use   Vaping Use: Some days  Substance and Sexual Activity   Alcohol use: Yes    Alcohol/week: 0.0 - 1.0 standard drinks    Comment: Occasional; 10-22 rarely    Drug use: No   Sexual activity: Not on file  Other Topics Concern   Not on file  Social History Narrative   Drinks about 2 cups of coffee a day, and some tea     Social Determinants of Health   Financial Resource Strain: Not on file  Food Insecurity: Not on file  Transportation Needs: Not on file  Physical Activity: Not on file  Stress: Not on file  Social Connections: Not on file  Intimate Partner Violence: Not on file    Current Outpatient Medications on File Prior to Visit  Medication Sig Dispense Refill   Accu-Chek Softclix Lancets lancets Use as instructed to check blood sugar 4 times daily Dx E11.51 100 each 5   amLODipine (NORVASC) 5 MG tablet Take 5 mg by mouth daily.     aspirin EC 81 MG tablet Take 81 mg by mouth at bedtime.     Blood Glucose Monitoring Suppl (ACCU-CHEK GUIDE ME) w/Device KIT Use to check blood sugar 4 times daily 1 kit 0   gabapentin (NEURONTIN) 300 MG capsule TAKE 1 CAPSULE BY MOUTH THREE TIMES A DAY  90 capsule 6   glucose blood (ACCU-CHEK GUIDE) test strip USE AS DIRECTED 4 TIMES  DAILY 350 strip 3   insulin NPH Human (HUMULIN N) 100 UNIT/ML injection Inject 0.1 mLs (10 Units total) into the skin every morning. And syringes 1/day. 10 mL 11   lovastatin (MEVACOR) 20 MG tablet Take 20 mg by mouth at bedtime.     metFORMIN (GLUCOPHAGE) 1000 MG tablet Take 1 tablet (1,000 mg total) by mouth 2 (two) times daily with a meal. 180 tablet 3   metoprolol succinate (TOPROL-XL) 100 MG 24 hr tablet Take 0.5 tablets (50 mg total) by mouth daily. 15 tablet 1   Netarsudil-Latanoprost (ROCKLATAN) 0.02-0.005 % SOLN Apply to eye.     OVER THE COUNTER MEDICATION Apply 1 patch topically daily as needed (pain). Apply to right shoulder     pantoprazole (PROTONIX) 40 MG tablet Take 40 mg by mouth daily.     PROAIR HFA 108 (90 BASE) MCG/ACT inhaler Inhale 2 puffs into the lungs every 4 (four) hours as needed for wheezing or shortness of breath. Reported on 11/04/2015     temazepam (RESTORIL) 30 MG capsule Take 30 mg by mouth at bedtime as needed for sleep.  0   No current facility-administered medications on file prior to visit.     Allergies  Allergen Reactions   Adhesive [Tape] Other (See Comments)    Pt states that tape and electrodes leave red "scars" on her skin and her skin is very sensitive.    Family History  Problem Relation Age of Onset   Heart disease Father    Heart attack Father        MI at age 20   Hypertension Mother    Alzheimer's disease Mother    Hypertension Sister    Diabetes Brother    Hyperlipidemia Brother    Hypertension Brother    Heart disease Brother    Heart attack Brother    Breast cancer Maternal Aunt     BP 114/64   Pulse 69   Ht '5\' 3"'  (1.6 m)   Wt 120 lb 9.6 oz (54.7 kg)   SpO2 98%   BMI 21.36 kg/m    Review of Systems     Objective:   Physical Exam   Lab Results  Component Value Date   CREATININE 0.68 09/04/2019   BUN 8 09/04/2019   NA 138 09/04/2019   K 3.8 09/04/2019   CL 104 09/04/2019   CO2 24 09/04/2019    Lab Results  Component Value Date   HGBA1C 6.8 (A) 10/24/2021      Assessment & Plan:  Insulin-requiring type 2 DM: overcontrolled.  Hypoglycemia, due to insulin/repaglinide.   Patient Instructions  check your blood sugar 4 times a day.  vary the time of day when you check, between before the 3 meals, and at bedtime.  also check if you have symptoms of your blood sugar being too high or too low.  please keep a record of the readings and bring it to your next appointment here (or you can bring the meter itself).  You can write it on any piece of paper.  please call us sooner if your blood sugar goes below 70, or if you have a lot of readings over 200.   I have sent a prescription to your pharmacy, to reduce the repaglinide to just 1 pill, twice a day (breakfast and supper) Please continue the same other diabetes medications.  I have also sent a  prescription to your pharmacy, for the glucose sensors.  Please see a diabetes educator, to learn how to use. Please come back for a follow-up appointment in 3 months.

## 2021-10-24 NOTE — Patient Instructions (Addendum)
check your blood sugar 4 times a day.  vary the time of day when you check, between before the 3 meals, and at bedtime.  also check if you have symptoms of your blood sugar being too high or too low.  please keep a record of the readings and bring it to your next appointment here (or you can bring the meter itself).  You can write it on any piece of paper.  please call us sooner if your blood sugar goes below 70, or if you have a lot of readings over 200.   I have sent a prescription to your pharmacy, to reduce the repaglinide to just 1 pill, twice a day (breakfast and supper) Please continue the same other diabetes medications.  I have also sent a prescription to your pharmacy, for the glucose sensors.  Please see a diabetes educator, to learn how to use. Please come back for a follow-up appointment in 3 months.

## 2021-10-28 ENCOUNTER — Telehealth: Payer: Self-pay | Admitting: Cardiology

## 2021-10-28 NOTE — Telephone Encounter (Signed)
   Pt said she missed a call from Jill Higgins, she thinks she is calling regarding her meds

## 2021-10-28 NOTE — Telephone Encounter (Signed)
Called pt to let her know Hilda Blades is out of the office and I can not find a note in her chart as to why she was being called. Will relay message to Hilda Blades to address next week. Pt verbalized understanding and thanked me for calling her.

## 2021-11-08 ENCOUNTER — Other Ambulatory Visit: Payer: Self-pay

## 2021-11-08 DIAGNOSIS — I739 Peripheral vascular disease, unspecified: Secondary | ICD-10-CM

## 2021-11-08 NOTE — Addendum Note (Signed)
Addended byDoylene Bode on: 11/08/2021 10:21 PM   Modules accepted: Orders

## 2021-11-10 DIAGNOSIS — R1013 Epigastric pain: Secondary | ICD-10-CM | POA: Diagnosis not present

## 2021-11-10 DIAGNOSIS — Z9889 Other specified postprocedural states: Secondary | ICD-10-CM | POA: Diagnosis not present

## 2021-11-10 DIAGNOSIS — K861 Other chronic pancreatitis: Secondary | ICD-10-CM | POA: Diagnosis not present

## 2021-11-10 DIAGNOSIS — R197 Diarrhea, unspecified: Secondary | ICD-10-CM | POA: Diagnosis not present

## 2021-11-22 ENCOUNTER — Other Ambulatory Visit: Payer: Self-pay

## 2021-11-22 ENCOUNTER — Encounter: Payer: No Typology Code available for payment source | Admitting: Nutrition

## 2021-11-23 ENCOUNTER — Encounter: Payer: No Typology Code available for payment source | Attending: Family Medicine | Admitting: Nutrition

## 2021-11-23 DIAGNOSIS — E1159 Type 2 diabetes mellitus with other circulatory complications: Secondary | ICD-10-CM

## 2021-11-25 ENCOUNTER — Ambulatory Visit (HOSPITAL_COMMUNITY): Payer: No Typology Code available for payment source

## 2021-11-25 ENCOUNTER — Ambulatory Visit: Payer: No Typology Code available for payment source | Admitting: Vascular Surgery

## 2021-11-25 ENCOUNTER — Other Ambulatory Visit: Payer: Self-pay | Admitting: Endocrinology

## 2021-11-26 NOTE — Progress Notes (Signed)
Patient was trained on Comer 2 sensors.  We discussed the difference between sensor readings and blood sugar readings and when it is necessary to do blood sugar readings.  She reported good understanding of this. Her phone was set up to receive the readings, and this was linked to our practice.  The sensor was placed into her upper left arm and timing was started without difficulty. She had no final questions.

## 2021-11-26 NOTE — Patient Instructions (Signed)
Read over manual Call help line if sensor falls off, or if questions.

## 2021-12-07 NOTE — Progress Notes (Deleted)
Patient is 76 year old female who returns for follow-up today.  She was last seen temper 2021.  She previously underwent right femoral endarterectomy right femoral-popliteal bypass in 2014.  She also had a left femoral-popliteal bypass in 2017.  She had angioplasty of the distal anastomosis in 2017.  At the time of her last office visit there were elevated velocities at the left proximal anastomosis at 294 cm/s.  She is having some pain in her calves when she walks.  However she had a very large operation a few months back and this may be somewhat due to deconditioning.  She does have diabetes and has had some control problems recently but they are adjusting her medications.  She is now on insulin.  Past Surgical History:  Procedure Laterality Date   ABDOMINAL AORTAGRAM N/A 08/15/2013   Procedure: ABDOMINAL Maxcine Ham;  Surgeon: Elam Dutch, MD;  Location: [facility] CATH LAB;  Service: Cardiovascular;  Laterality: N/A;   ABDOMINAL AORTAGRAM N/A 06/19/2014   Procedure: ABDOMINAL Maxcine Ham;  Surgeon: Elam Dutch, MD;  Location: [facility] CATH LAB;  Service: Cardiovascular;  Laterality: N/A;   ABDOMINAL AORTOGRAM W/LOWER EXTREMITY N/A 05/10/2018   Procedure: ABDOMINAL AORTOGRAM W/LOWER EXTREMITY;  Surgeon: Elam Dutch, MD;  Location: [place] CV LAB;  Service: Cardiovascular;  Laterality: N/A;  bilateral   Aortogram w/ PTA  05/14/08,  11-04-10   Bilateral aortogram w/ bilateral  SFA PTA  stenting    BREAST SURGERY Right    boil removal   BUNIONECTOMY     L foot in the 1980s   COLONOSCOPY     2014   DILATION AND CURETTAGE OF UTERUS     X4   ENDARTERECTOMY FEMORAL Right 06/03/2018   Procedure: RIGHT FEMORAL ENDARTERECTOMY;  Surgeon: Elam Dutch, MD;  Location: [place];  Service: Vascular;  Laterality: Right;   Epidural shots in neck      ESOPHAGOGASTRODUODENOSCOPY (EGD) WITH PROPOFOL N/A 09/15/2019   Procedure: ESOPHAGOGASTRODUODENOSCOPY (EGD) WITH PROPOFOL;  Surgeon: Arta Silence, MD;   Location: [facility] ENDOSCOPY;  Service: Endoscopy;  Laterality: N/A;   EUS N/A 09/15/2019   Procedure: UPPER ENDOSCOPIC ULTRASOUND (EUS) LINEAR;  Surgeon: Arta Silence, MD;  Location: [facility];  Service: Endoscopy;  Laterality: N/A;   EYE SURGERY Bilateral 2016   cataract removal   FEMORAL-POPLITEAL BYPASS GRAFT Left 10/21/2013   Procedure: LEFT FEMORAL-POPLITEAL ARTERY BYPASS WITH SAPHENOUS VEIN GRAFT , POPLITEAL ENDARTERECTOMY ,INTRAOPERATIVE ARTERIOGRAM, vein patch angioplasty to popliteal artery;  Surgeon: Elam Dutch, MD;  Location: [facility] OR;  Service: Vascular;  Laterality: Left;   FEMORAL-POPLITEAL BYPASS GRAFT Right 02/08/2016   Procedure: Right FEMORAL- to Above Knee POPLITEAL ARTERY Bypass Graft with reversed saphenous vein and Common Femoral Endarterectomy  with profundoplasty;  Surgeon: Elam Dutch, MD;  Location: [place];  Service: Vascular;  Laterality: Right;   FINE NEEDLE ASPIRATION N/A 09/15/2019   Procedure: FINE NEEDLE ASPIRATION (FNA) LINEAR;  Surgeon: Arta Silence, MD;  [facility];  Service: Endoscopy;  Laterality: N/A;   GROIN DEBRIDEMENT Left 10/28/2013   Procedure: left inner thigh DEBRIDEMENT;  Surgeon: Elam Dutch, MD;  Location: [facility] OR;  Service: Vascular;  Laterality: Left;   HAND SURGERY Left 2010   LOWER EXTREMITY ANGIOGRAM Bilateral 12/24/2015   Procedure: Lower Extremity Angiogram;  Surgeon: Elam Dutch, MD;  Location: [place] CV LAB;  Service: Cardiovascular;  Laterality: Bilateral;   PATCH ANGIOPLASTY Right 06/03/2018   Procedure: PATCH ANGIOPLASTY USING A XENOSURE 1CM X 14CM BIOLOGIC PATCH;  Surgeon: Elam Dutch, MD;  Location: [facility] OR;  Service: Vascular;  Laterality: Right;   PERIPHERAL VASCULAR CATHETERIZATION N/A 12/24/2015   Procedure: Abdominal Aortogram;  Surgeon: Elam Dutch, MD;  Location: [place] CV LAB;  Service: Cardiovascular;  Laterality: N/A;   PERIPHERAL VASCULAR CATHETERIZATION Left 12/24/2015   Procedure:  Peripheral Vascular Balloon Angioplasty;  Surgeon: Elam Dutch, MD;  Location: [place] [facility] LAB;  Service: Cardiovascular;  Laterality: Left;  drug coated balloon   PERIPHERAL VASCULAR CATHETERIZATION N/A 06/30/2016   Procedure: Abdominal Aortogram;  Surgeon: Elam Dutch, MD;  Location: [place] CV LAB;  Service: Cardiovascular;  Laterality: N/A;   PERIPHERAL VASCULAR CATHETERIZATION Bilateral 06/30/2016   Procedure: Lower Extremity Angiography;  Surgeon: Elam Dutch, MD;  Location: [place] CV LAB;  Service: Cardiovascular;  Laterality: Bilateral;   TONSILLECTOMY       Past Medical History:  Diagnosis Date   Bronchitis    Bulging disc    in neck   Depression with anxiety    Diabetes mellitus    Type II   Fibromyalgia    pt. denies   GERD (gastroesophageal reflux disease)    Glaucoma    History of bronchitis    Hyperlipidemia    Hypertension    Overactive bladder    Peripheral vascular disease (HCC)    Pneumonia    Sleep apnea    recent test negative for sleep apnea   Tobacco abuse    Urinary frequency    Current Outpatient Medications on File Prior to Visit  Medication Sig Dispense Refill   Accu-Chek Softclix Lancets lancets Use as instructed to check blood sugar 4 times daily Dx E11.51 100 each 5   amLODipine (NORVASC) 5 MG tablet Take 5 mg by mouth daily.     aspirin EC 81 MG tablet Take 81 mg by mouth at bedtime.     Blood Glucose Monitoring Suppl (ACCU-CHEK GUIDE ME) w/Device KIT Use to check blood sugar 4 times daily 1 kit 0   Continuous Blood Gluc Sensor (FREESTYLE LIBRE 2 SENSOR) MISC 1 Device by Does not apply route every 14 (fourteen) days. 6 each 3   gabapentin (NEURONTIN) 300 MG capsule TAKE 1 CAPSULE BY MOUTH THREE TIMES A DAY 90 capsule 6   glucose blood (ACCU-CHEK GUIDE) test strip USE AS DIRECTED 4 TIMES  DAILY 350 strip 3   insulin NPH Human (HUMULIN N) 100 UNIT/ML injection Inject 0.1 mLs (10 Units total) into the skin every morning. And  syringes 1/day. 10 mL 11   lovastatin (MEVACOR) 20 MG tablet Take 20 mg by mouth at bedtime.     metFORMIN (GLUCOPHAGE) 1000 MG tablet Take 1 tablet (1,000 mg total) by mouth 2 (two) times daily with a meal. 180 tablet 3   metoprolol succinate (TOPROL-XL) 100 MG 24 hr tablet Take 0.5 tablets (50 mg total) by mouth daily. 15 tablet 1   Netarsudil-Latanoprost (ROCKLATAN) 0.02-0.005 % SOLN Apply to eye.     OVER THE COUNTER MEDICATION Apply 1 patch topically daily as needed (pain). Apply to right shoulder     pantoprazole (PROTONIX) 40 MG tablet Take 40 mg by mouth daily.     PROAIR HFA 108 (90 BASE) MCG/ACT inhaler Inhale 2 puffs into the lungs every 4 (four) hours as needed for wheezing or shortness of breath. Reported on 11/04/2015     repaglinide (PRANDIN) 2 MG tablet TAKE 2 TABLETS BY MOUTH  TWICE DAILY BEFORE MEALS 360 tablet 3  temazepam (RESTORIL) 30 MG capsule Take 30 mg by mouth at bedtime as needed for sleep.  0   No current facility-administered medications on file prior to visit.    Review of systems: She has no shortness of breath.  She has no chest pain.  Physical exam:  There were no vitals filed for this visit.   Extremities: 2+ dorsalis pedis pulses bilaterally  Data: Right side ABI 0.92 left side 1.01.  These are essentially unchanged from her last office visit.  I reviewed and interpreted the study.  She also had a repeat duplex exam which showed the velocities on the left side are now 229 cm/s slightly decreased and on grayscale there is no obvious narrowing.  Right leg bypass was widely patent.  Assessment: Doing well status post bilateral femoral-popliteal bypass grafting.  She may still have a little bit of narrowing proximal to the left anastomosis.  However, her velocities are decreased compared to her last office visit.  Grayscale images did not really show any anatomic narrowing.  Plan: Patient will continues to exercise daily and try to walk for 30 minutes.  She  will follow-up in 6 months with repeat ABIs and graft duplex scan.  If her numbers are stable at that point she could probably go to once a year.  She will be seen in our APP clinic.  Ruta Hinds, MD [facility] Office: [phone]

## 2021-12-09 ENCOUNTER — Inpatient Hospital Stay (HOSPITAL_COMMUNITY): Admission: RE | Admit: 2021-12-09 | Payer: No Typology Code available for payment source | Source: Ambulatory Visit

## 2021-12-09 ENCOUNTER — Ambulatory Visit: Payer: No Typology Code available for payment source | Admitting: Vascular Surgery

## 2021-12-29 ENCOUNTER — Other Ambulatory Visit: Payer: Self-pay | Admitting: Endocrinology

## 2022-01-13 DIAGNOSIS — R197 Diarrhea, unspecified: Secondary | ICD-10-CM | POA: Diagnosis not present

## 2022-01-13 DIAGNOSIS — R109 Unspecified abdominal pain: Secondary | ICD-10-CM | POA: Diagnosis not present

## 2022-01-16 DIAGNOSIS — K591 Functional diarrhea: Secondary | ICD-10-CM | POA: Diagnosis not present

## 2022-01-16 DIAGNOSIS — D509 Iron deficiency anemia, unspecified: Secondary | ICD-10-CM | POA: Diagnosis not present

## 2022-01-16 DIAGNOSIS — K648 Other hemorrhoids: Secondary | ICD-10-CM | POA: Diagnosis not present

## 2022-01-18 DIAGNOSIS — K591 Functional diarrhea: Secondary | ICD-10-CM | POA: Diagnosis not present

## 2022-01-20 ENCOUNTER — Other Ambulatory Visit (HOSPITAL_COMMUNITY): Payer: No Typology Code available for payment source

## 2022-01-20 ENCOUNTER — Encounter (HOSPITAL_COMMUNITY): Payer: No Typology Code available for payment source

## 2022-01-20 ENCOUNTER — Ambulatory Visit: Payer: No Typology Code available for payment source | Admitting: Vascular Surgery

## 2022-01-23 ENCOUNTER — Ambulatory Visit (INDEPENDENT_AMBULATORY_CARE_PROVIDER_SITE_OTHER): Payer: No Typology Code available for payment source | Admitting: Endocrinology

## 2022-01-23 ENCOUNTER — Other Ambulatory Visit: Payer: Self-pay

## 2022-01-23 VITALS — BP 140/60 | HR 63 | Ht 63.0 in | Wt 119.4 lb

## 2022-01-23 DIAGNOSIS — E1151 Type 2 diabetes mellitus with diabetic peripheral angiopathy without gangrene: Secondary | ICD-10-CM | POA: Diagnosis not present

## 2022-01-23 LAB — POCT GLYCOSYLATED HEMOGLOBIN (HGB A1C): Hemoglobin A1C: 6.3 % — AB (ref 4.0–5.6)

## 2022-01-23 MED ORDER — METFORMIN HCL ER 500 MG PO TB24: 2000.0000 mg | ORAL_TABLET | Freq: Every day | ORAL | 3 refills | Status: DC

## 2022-01-23 NOTE — Patient Instructions (Addendum)
check your blood sugar 4 times a day.  vary the time of day when you check, between before the 3 meals, and at bedtime.  also check if you have symptoms of your blood sugar being too high or too low.  please keep a record of the readings and bring it to your next appointment here (or you can bring the meter itself).  You can write it on any piece of paper.  please call us sooner if your blood sugar goes below 70, or if you have a lot of readings over 200.   ?Please stop taking the insulin, and: ?I have sent a prescription to your pharmacy, to change metformin to extended-release, and:  ?Please continue the same repaglinide.   ?Please come back for a follow-up appointment in 2 months.     ?

## 2022-01-23 NOTE — Progress Notes (Signed)
Subjective:    Patient ID: Jill Higgins, female    DOB: November 03, 1946, 76 y.o.   MRN: [id number]  HPI Pt returns for f/u of diabetes mellitus:  DM type: Insulin-requiring type 2 Dx'ed: 9935 Complications: CAD and PAD.   Therapy: insulin since 2021, and 2 oral meds.   GDM: never DKA: never Severe hypoglycemia: never Pancreatitis: once (2020) Pancreatic imaging: normal on 2019 CT.   SDOH: She cannot afford name brand meds. 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   Interval history: I reviewed continuous glucose monitor data.  Glucose varies from 50-250.  There is minimal trend throughout the day.  No recent steroids.  She has hypoglycemia approx BID.  Past Medical History:  Diagnosis Date   Bronchitis    Bulging disc    in neck   Depression with anxiety    Diabetes mellitus    Type II   Fibromyalgia    pt. denies   GERD (gastroesophageal reflux disease)    Glaucoma    History of bronchitis    Hyperlipidemia    Hypertension    Overactive bladder    Peripheral vascular disease (HCC)    Pneumonia    Sleep apnea    recent test negative for sleep apnea   Tobacco abuse    Urinary frequency     Past Surgical History:  Procedure Laterality Date   ABDOMINAL AORTAGRAM N/A 08/15/2013   Procedure: ABDOMINAL Maxcine Ham;  Surgeon: Elam Dutch, MD;  Location: [facility] CATH LAB;  Service: Cardiovascular;  Laterality: N/A;   ABDOMINAL AORTAGRAM N/A 06/19/2014   Procedure: ABDOMINAL Maxcine Ham;  Surgeon: Elam Dutch, MD;  Location: [facility] CATH LAB;  Service: Cardiovascular;  Laterality: N/A;   ABDOMINAL AORTOGRAM W/LOWER EXTREMITY N/A 05/10/2018   Procedure: ABDOMINAL AORTOGRAM W/LOWER EXTREMITY;  Surgeon: Elam Dutch, MD;  Location: [place] [facility] LAB;  Service: Cardiovascular;  Laterality: N/A;  bilateral   Aortogram w/ PTA  05/14/08,  11-04-10   Bilateral  aortogram w/ bilateral  SFA PTA  stenting    BREAST SURGERY Right    boil removal   BUNIONECTOMY     L foot in the 1980s   COLONOSCOPY     2014   DILATION AND CURETTAGE OF UTERUS     X4   ENDARTERECTOMY FEMORAL Right 06/03/2018   Procedure: RIGHT FEMORAL ENDARTERECTOMY;  Surgeon: Elam Dutch, MD;  Location: [place];  Service: Vascular;  Laterality: Right;   Epidural shots in neck      ESOPHAGOGASTRODUODENOSCOPY (EGD) WITH PROPOFOL N/A 09/15/2019   Procedure: ESOPHAGOGASTRODUODENOSCOPY (EGD) WITH PROPOFOL;  Surgeon: Arta Silence, MD;  [facility];  Service: Endoscopy;  Laterality: N/A;   EUS N/A 09/15/2019   Procedure: UPPER ENDOSCOPIC ULTRASOUND (EUS) LINEAR;  Surgeon: Arta Silence, MD;  [facility];  Service: Endoscopy;  Laterality: N/A;   EYE SURGERY Bilateral 2016   cataract removal   FEMORAL-POPLITEAL BYPASS GRAFT Left 10/21/2013   Procedure: LEFT FEMORAL-POPLITEAL ARTERY BYPASS WITH SAPHENOUS VEIN GRAFT , POPLITEAL ENDARTERECTOMY ,INTRAOPERATIVE ARTERIOGRAM, vein patch angioplasty to popliteal artery;  Surgeon: Elam Dutch, MD;  Location: [facility] OR;  Service: Vascular;  Laterality: Left;   FEMORAL-POPLITEAL BYPASS GRAFT Right 02/08/2016   Procedure: Right FEMORAL- to Above Knee POPLITEAL ARTERY Bypass Graft with reversed saphenous vein and Common Femoral Endarterectomy  with profundoplasty;  Surgeon: Elam Dutch, MD;  Location: [place];  Service: Vascular;  Laterality: Right;   FINE NEEDLE ASPIRATION N/A 09/15/2019   Procedure: FINE NEEDLE ASPIRATION (FNA) LINEAR;  Surgeon: Arta Silence, MD;  [facility];  Service: Endoscopy;  Laterality: N/A;   GROIN DEBRIDEMENT Left 10/28/2013   Procedure: left inner thigh DEBRIDEMENT;  Surgeon: Elam Dutch, MD;  Location: [facility] OR;  Service: Vascular;  Laterality: Left;   HAND SURGERY Left 2010   LOWER EXTREMITY ANGIOGRAM Bilateral 12/24/2015   Procedure: Lower Extremity Angiogram;  Surgeon: Elam Dutch, MD;  Location: [place] CV LAB;  Service: Cardiovascular;  Laterality: Bilateral;   PATCH ANGIOPLASTY Right 06/03/2018   Procedure: PATCH ANGIOPLASTY USING A XENOSURE 1CM X 14CM BIOLOGIC PATCH;  Surgeon: Elam Dutch, MD;  Location: [facility] OR;  Service: Vascular;  Laterality: Right;   PERIPHERAL VASCULAR CATHETERIZATION N/A 12/24/2015   Procedure: Abdominal Aortogram;  Surgeon: Elam Dutch, MD;  Location: [place] CV LAB;  Service: Cardiovascular;  Laterality: N/A;   PERIPHERAL VASCULAR CATHETERIZATION Left 12/24/2015   Procedure: Peripheral Vascular Balloon Angioplasty;  Surgeon: Elam Dutch, MD;  Location: [place] CV LAB;  Service: Cardiovascular;  Laterality: Left;  drug coated balloon   PERIPHERAL VASCULAR CATHETERIZATION N/A 06/30/2016   Procedure: Abdominal Aortogram;  Surgeon: Elam Dutch, MD;  Location: [place] CV LAB;  Service: Cardiovascular;  Laterality: N/A;   PERIPHERAL VASCULAR CATHETERIZATION Bilateral 06/30/2016   Procedure: Lower Extremity Angiography;  Surgeon: Elam Dutch, MD;  Location: [place] CV LAB;  Service: Cardiovascular;  Laterality: Bilateral;   TONSILLECTOMY      Social History   Socioeconomic History   Marital status: Single    Spouse name: Not on file   Number of children: 1   Years of education: Not on file   Highest education level: Not on file  Occupational History   Occupation: Retired    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  Tobacco Use   Smoking status: Former    Packs/day: 0.10    Types: Cigarettes    Quit date: 10/2014    Years since quitting: 7.2   Smokeless tobacco: Never   Tobacco comments:    pt states that she is using the E cig only   Vaping Use   Vaping Use: Some days  Substance and Sexual Activity   Alcohol use: Yes    Alcohol/week: 0.0 - 1.0 standard drinks    Comment: Occasional; 10-22 rarely    Drug use: No   Sexual activity: Not on file  Other Topics Concern   Not on file  Social History Narrative    Drinks about 2 cups of coffee a day, and some tea    Social Determinants of Health   Financial Resource Strain: Not on file  Food Insecurity: Not on file  Transportation Needs: Not on file  Physical Activity: Not on file  Stress: Not on file  Social Connections: Not on file  Intimate Partner Violence: Not on file    Current Outpatient Medications on File Prior to Visit  Medication Sig Dispense Refill   Accu-Chek Softclix Lancets lancets Use as instructed to check blood sugar 4 times daily Dx E11.51 100 each 5   amLODipine (NORVASC) 5 MG tablet Take 5 mg by mouth daily.     aspirin EC 81 MG tablet Take 81 mg by mouth at bedtime.     Blood Glucose Monitoring Suppl (ACCU-CHEK GUIDE ME) w/Device KIT Use to check blood sugar 4 times daily 1 kit 0   Continuous Blood Gluc Sensor (FREESTYLE LIBRE  2 SENSOR) MISC 1 Device by Does not apply route every 14 (fourteen) days. 6 each 3   gabapentin (NEURONTIN) 300 MG capsule TAKE 1 CAPSULE BY MOUTH THREE TIMES A DAY 90 capsule 6   glucose blood (ACCU-CHEK GUIDE) test strip USE AS DIRECTED 4 TIMES  DAILY 350 strip 3   lovastatin (MEVACOR) 20 MG tablet Take 20 mg by mouth at bedtime.     metoprolol succinate (TOPROL-XL) 100 MG 24 hr tablet Take 0.5 tablets (50 mg total) by mouth daily. 15 tablet 1   Netarsudil-Latanoprost (ROCKLATAN) 0.02-0.005 % SOLN Apply to eye.     OVER THE COUNTER MEDICATION Apply 1 patch topically daily as needed (pain). Apply to right shoulder     pantoprazole (PROTONIX) 40 MG tablet Take 40 mg by mouth daily.     PROAIR HFA 108 (90 BASE) MCG/ACT inhaler Inhale 2 puffs into the lungs every 4 (four) hours as needed for wheezing or shortness of breath. Reported on 11/04/2015     RELION INSULIN SYR 0.3ML/31G 31G X 5/16" 0.3 ML MISC USE 1 SYRINGE IN THE MORNING WITH  INSULIN 100 each 0   repaglinide (PRANDIN) 2 MG tablet TAKE 2 TABLETS BY MOUTH  TWICE DAILY BEFORE MEALS 360 tablet 3   temazepam (RESTORIL) 30 MG capsule Take 30 mg by  mouth at bedtime as needed for sleep.  0   No current facility-administered medications on file prior to visit.    Allergies  Allergen Reactions   Adhesive [Tape] Other (See Comments)    Pt states that tape and electrodes leave red "scars" on her skin and her skin is very sensitive.    Family History  Problem Relation Age of Onset   Heart disease Father    Heart attack Father        MI at age 67   Hypertension Mother    Alzheimer's disease Mother    Hypertension Sister    Diabetes Brother    Hyperlipidemia Brother    Hypertension Brother    Heart disease Brother    Heart attack Brother    Breast cancer Maternal Aunt     BP 140/60    Pulse 63    Ht '5\' 3"'  (1.6 m)    Wt 119 lb 6.4 oz (54.2 kg)    SpO2 97%    BMI 21.15 kg/m    Review of Systems She has diarrhea    Objective:   Physical Exam    Lab Results  Component Value Date   CREATININE 0.68 09/04/2019   BUN 8 09/04/2019   NA 138 09/04/2019   K 3.8 09/04/2019   CL 104 09/04/2019   CO2 24 09/04/2019   Lab Results  Component Value Date   HGBA1C 6.3 (A) 01/23/2022      Assessment & Plan:  Type 2 DM: overcontrolled. Hypoglycemia, due to insulin.    Patient Instructions  check your blood sugar 4 times a day.  vary the time of day when you check, between before the 3 meals, and at bedtime.  also check if you have symptoms of your blood sugar being too high or too low.  please keep a record of the readings and bring it to your next appointment here (or you can bring the meter itself).  You can write it on any piece of paper.  please call us sooner if your blood sugar goes below 70, or if you have a lot of readings over 200.   Please stop taking the  insulin, and: I have sent a prescription to your pharmacy, to change metformin to extended-release, and:  Please continue the same repaglinide.   Please come back for a follow-up appointment in 2 months.

## 2022-01-24 ENCOUNTER — Encounter: Payer: Self-pay | Admitting: Endocrinology

## 2022-01-24 DIAGNOSIS — R079 Chest pain, unspecified: Secondary | ICD-10-CM | POA: Diagnosis not present

## 2022-01-24 DIAGNOSIS — R197 Diarrhea, unspecified: Secondary | ICD-10-CM | POA: Diagnosis not present

## 2022-01-24 DIAGNOSIS — I10 Essential (primary) hypertension: Secondary | ICD-10-CM | POA: Diagnosis not present

## 2022-01-25 ENCOUNTER — Other Ambulatory Visit (HOSPITAL_COMMUNITY): Payer: No Typology Code available for payment source

## 2022-01-25 ENCOUNTER — Encounter (HOSPITAL_COMMUNITY): Payer: Self-pay

## 2022-01-25 ENCOUNTER — Inpatient Hospital Stay (HOSPITAL_COMMUNITY)
Admission: EM | Admit: 2022-01-25 | Discharge: 2022-01-31 | DRG: 439 | Disposition: A | Discharge status: Home / Self Care | Payer: No Typology Code available for payment source | Attending: Internal Medicine | Admitting: Internal Medicine

## 2022-01-25 ENCOUNTER — Emergency Department (HOSPITAL_COMMUNITY): Payer: No Typology Code available for payment source

## 2022-01-25 ENCOUNTER — Ambulatory Visit: Payer: No Typology Code available for payment source | Admitting: Vascular Surgery

## 2022-01-25 ENCOUNTER — Encounter (HOSPITAL_COMMUNITY): Payer: No Typology Code available for payment source

## 2022-01-25 ENCOUNTER — Encounter (HOSPITAL_COMMUNITY): Payer: Self-pay | Admitting: Emergency Medicine

## 2022-01-25 ENCOUNTER — Other Ambulatory Visit: Payer: Self-pay

## 2022-01-25 DIAGNOSIS — K219 Gastro-esophageal reflux disease without esophagitis: Secondary | ICD-10-CM | POA: Diagnosis not present

## 2022-01-25 DIAGNOSIS — M797 Fibromyalgia: Secondary | ICD-10-CM | POA: Diagnosis not present

## 2022-01-25 DIAGNOSIS — I739 Peripheral vascular disease, unspecified: Secondary | ICD-10-CM | POA: Diagnosis not present

## 2022-01-25 DIAGNOSIS — K861 Other chronic pancreatitis: Secondary | ICD-10-CM | POA: Diagnosis not present

## 2022-01-25 DIAGNOSIS — Z87891 Personal history of nicotine dependence: Secondary | ICD-10-CM | POA: Diagnosis not present

## 2022-01-25 DIAGNOSIS — Z79899 Other long term (current) drug therapy: Secondary | ICD-10-CM | POA: Diagnosis not present

## 2022-01-25 DIAGNOSIS — Z7982 Long term (current) use of aspirin: Secondary | ICD-10-CM

## 2022-01-25 DIAGNOSIS — E1142 Type 2 diabetes mellitus with diabetic polyneuropathy: Secondary | ICD-10-CM | POA: Diagnosis not present

## 2022-01-25 DIAGNOSIS — Z90411 Acquired partial absence of pancreas: Secondary | ICD-10-CM

## 2022-01-25 DIAGNOSIS — Z86718 Personal history of other venous thrombosis and embolism: Secondary | ICD-10-CM | POA: Diagnosis not present

## 2022-01-25 DIAGNOSIS — E1159 Type 2 diabetes mellitus with other circulatory complications: Secondary | ICD-10-CM | POA: Diagnosis present

## 2022-01-25 DIAGNOSIS — I10 Essential (primary) hypertension: Secondary | ICD-10-CM | POA: Diagnosis not present

## 2022-01-25 DIAGNOSIS — Z7984 Long term (current) use of oral hypoglycemic drugs: Secondary | ICD-10-CM | POA: Diagnosis not present

## 2022-01-25 DIAGNOSIS — Z794 Long term (current) use of insulin: Secondary | ICD-10-CM

## 2022-01-25 DIAGNOSIS — Z8249 Family history of ischemic heart disease and other diseases of the circulatory system: Secondary | ICD-10-CM | POA: Diagnosis not present

## 2022-01-25 DIAGNOSIS — E1151 Type 2 diabetes mellitus with diabetic peripheral angiopathy without gangrene: Secondary | ICD-10-CM | POA: Diagnosis present

## 2022-01-25 DIAGNOSIS — K529 Noninfective gastroenteritis and colitis, unspecified: Secondary | ICD-10-CM | POA: Diagnosis not present

## 2022-01-25 DIAGNOSIS — E785 Hyperlipidemia, unspecified: Secondary | ICD-10-CM | POA: Diagnosis present

## 2022-01-25 DIAGNOSIS — Z6821 Body mass index (BMI) 21.0-21.9, adult: Secondary | ICD-10-CM

## 2022-01-25 DIAGNOSIS — E7800 Pure hypercholesterolemia, unspecified: Secondary | ICD-10-CM | POA: Diagnosis not present

## 2022-01-25 DIAGNOSIS — Z833 Family history of diabetes mellitus: Secondary | ICD-10-CM

## 2022-01-25 DIAGNOSIS — E876 Hypokalemia: Secondary | ICD-10-CM | POA: Diagnosis present

## 2022-01-25 DIAGNOSIS — E46 Unspecified protein-calorie malnutrition: Secondary | ICD-10-CM | POA: Diagnosis present

## 2022-01-25 DIAGNOSIS — K859 Acute pancreatitis without necrosis or infection, unspecified: Principal | ICD-10-CM | POA: Diagnosis present

## 2022-01-25 DIAGNOSIS — Z803 Family history of malignant neoplasm of breast: Secondary | ICD-10-CM | POA: Diagnosis not present

## 2022-01-25 DIAGNOSIS — K3580 Unspecified acute appendicitis: Secondary | ICD-10-CM | POA: Diagnosis not present

## 2022-01-25 DIAGNOSIS — K5900 Constipation, unspecified: Secondary | ICD-10-CM | POA: Diagnosis not present

## 2022-01-25 DIAGNOSIS — N3281 Overactive bladder: Secondary | ICD-10-CM | POA: Diagnosis not present

## 2022-01-25 DIAGNOSIS — K8689 Other specified diseases of pancreas: Secondary | ICD-10-CM | POA: Diagnosis not present

## 2022-01-25 DIAGNOSIS — Z20822 Contact with and (suspected) exposure to covid-19: Secondary | ICD-10-CM | POA: Diagnosis not present

## 2022-01-25 DIAGNOSIS — Z9049 Acquired absence of other specified parts of digestive tract: Secondary | ICD-10-CM

## 2022-01-25 LAB — CBC WITH DIFFERENTIAL/PLATELET
Abs Immature Granulocytes: 0.02 10*3/uL (ref 0.00–0.07)
Basophils Absolute: 0 10*3/uL (ref 0.0–0.1)
Basophils Relative: 0 %
Eosinophils Absolute: 0.1 10*3/uL (ref 0.0–0.5)
Eosinophils Relative: 1 %
HCT: 40.1 % (ref 36.0–46.0)
Hemoglobin: 12.8 g/dL (ref 12.0–15.0)
Immature Granulocytes: 0 %
Lymphocytes Relative: 19 %
Lymphs Abs: 1.7 10*3/uL (ref 0.7–4.0)
MCH: 30.6 pg (ref 26.0–34.0)
MCHC: 31.9 g/dL (ref 30.0–36.0)
MCV: 95.9 fL (ref 80.0–100.0)
Monocytes Absolute: 0.5 10*3/uL (ref 0.1–1.0)
Monocytes Relative: 6 %
Neutro Abs: 6.3 10*3/uL (ref 1.7–7.7)
Neutrophils Relative %: 74 %
Platelets: 352 10*3/uL (ref 150–400)
RBC: 4.18 MIL/uL (ref 3.87–5.11)
RDW: 14.1 % (ref 11.5–15.5)
WBC: 8.6 10*3/uL (ref 4.0–10.5)
nRBC: 0 % (ref 0.0–0.2)

## 2022-01-25 LAB — URINALYSIS, ROUTINE W REFLEX MICROSCOPIC
Bilirubin Urine: NEGATIVE
Glucose, UA: NEGATIVE mg/dL
Hgb urine dipstick: NEGATIVE
Ketones, ur: 80 mg/dL — AB
Leukocytes,Ua: NEGATIVE
Nitrite: NEGATIVE
Protein, ur: NEGATIVE mg/dL
Specific Gravity, Urine: >1.046 — ABNORMAL HIGH (ref 1.005–1.030)
pH: 7 (ref 5.0–8.0)

## 2022-01-25 LAB — LIPASE, BLOOD: Lipase: 70 U/L — ABNORMAL HIGH (ref 11–51)

## 2022-01-25 LAB — GLUCOSE, CAPILLARY
Glucose-Capillary: 90 mg/dL (ref 70–99)
Glucose-Capillary: 172 mg/dL — ABNORMAL HIGH (ref 70–99)

## 2022-01-25 LAB — COMPREHENSIVE METABOLIC PANEL
ALT: 12 U/L (ref 0–44)
AST: 19 U/L (ref 15–41)
Albumin: 3.1 g/dL — ABNORMAL LOW (ref 3.5–5.0)
Alkaline Phosphatase: 67 U/L (ref 38–126)
Anion gap: 9 (ref 5–15)
BUN: 11 mg/dL (ref 8–23)
CO2: 27 mmol/L (ref 22–32)
Calcium: 8.9 mg/dL (ref 8.9–10.3)
Chloride: 100 mmol/L (ref 98–111)
Creatinine, Ser: 0.75 mg/dL (ref 0.44–1.00)
GFR, Estimated: >60 mL/min (ref >60)
Glucose, Bld: 137 mg/dL — ABNORMAL HIGH (ref 70–99)
Potassium: 3.6 mmol/L (ref 3.5–5.1)
Sodium: 136 mmol/L (ref 135–145)
Total Bilirubin: 0.2 mg/dL — ABNORMAL LOW (ref 0.3–1.2)
Total Protein: 6.3 g/dL — ABNORMAL LOW (ref 6.5–8.1)

## 2022-01-25 LAB — TRIGLYCERIDES: Triglycerides: 75 mg/dL (ref <150)

## 2022-01-25 LAB — TYPE AND SCREEN
ABO/RH(D): A POS
Antibody Screen: NEGATIVE

## 2022-01-25 LAB — RESP PANEL BY RT-PCR (FLU A&B, COVID) ARPGX2
Influenza A by PCR: NEGATIVE
Influenza B by PCR: NEGATIVE
SARS Coronavirus 2 by RT PCR: NEGATIVE

## 2022-01-25 LAB — CBG MONITORING, ED: Glucose-Capillary: 130 mg/dL — ABNORMAL HIGH (ref 70–99)

## 2022-01-25 MED ORDER — ONDANSETRON HCL 4 MG/2ML IJ SOLN
4.0000 mg | Freq: Four times a day (QID) | INTRAMUSCULAR | Status: DC | PRN
Start: 2022-01-25 — End: 2022-01-25

## 2022-01-25 MED ORDER — LACTATED RINGERS IV SOLN: INTRAVENOUS | Status: DC

## 2022-01-25 MED ORDER — BOOST / RESOURCE BREEZE PO LIQD CUSTOM
1.0000 | Freq: Three times a day (TID) | ORAL | Status: DC
  Administered 2022-01-25 – 2022-01-31 (×16): 1 via ORAL
  Filled 2022-01-25 (×2): qty 1

## 2022-01-25 MED ORDER — ASPIRIN EC 81 MG PO TBEC
81.0000 mg | DELAYED_RELEASE_TABLET | Freq: Every day | ORAL | Status: DC
  Administered 2022-01-25 – 2022-01-30 (×6): 81 mg via ORAL
  Filled 2022-01-25 (×6): qty 1

## 2022-01-25 MED ORDER — OXYCODONE-ACETAMINOPHEN 5-325 MG PO TABS
1.0000 | ORAL_TABLET | Freq: Once | ORAL | Status: AC
  Administered 2022-01-25: 1 via ORAL
  Filled 2022-01-25: qty 1

## 2022-01-25 MED ORDER — AMLODIPINE BESYLATE 5 MG PO TABS
5.0000 mg | ORAL_TABLET | Freq: Every day | ORAL | Status: DC
  Administered 2022-01-25 – 2022-01-31 (×7): 5 mg via ORAL
  Filled 2022-01-25 (×7): qty 1

## 2022-01-25 MED ORDER — ONDANSETRON HCL 4 MG/2ML IJ SOLN
4.0000 mg | Freq: Four times a day (QID) | INTRAMUSCULAR | Status: DC | PRN
Start: 2022-01-25 — End: 2022-01-31
  Administered 2022-01-26: 01:00:00 4 mg via INTRAVENOUS
  Filled 2022-01-25: qty 2

## 2022-01-25 MED ORDER — ONDANSETRON HCL 4 MG PO TABS: 4.0000 mg | ORAL_TABLET | Freq: Four times a day (QID) | ORAL | Status: DC | PRN

## 2022-01-25 MED ORDER — IOHEXOL 300 MG/ML  SOLN
100.0000 mL | Freq: Once | INTRAMUSCULAR | Status: AC | PRN
  Administered 2022-01-25: 100 mL via INTRAVENOUS

## 2022-01-25 MED ORDER — PANCRELIPASE (LIP-PROT-AMYL) 12000-38000 UNITS PO CPEP
24000.0000 [IU] | ORAL_CAPSULE | Freq: Three times a day (TID) | ORAL | Status: DC
  Administered 2022-01-25 – 2022-01-31 (×18): 24000 [IU] via ORAL
  Filled 2022-01-25 (×19): qty 2

## 2022-01-25 MED ORDER — INSULIN ASPART 100 UNIT/ML IJ SOLN
0.0000 [IU] | INTRAMUSCULAR | Status: DC
  Administered 2022-01-25 – 2022-01-26 (×2): 2 [IU] via SUBCUTANEOUS
  Administered 2022-01-27: 5 [IU] via SUBCUTANEOUS
  Administered 2022-01-27 (×2): 3 [IU] via SUBCUTANEOUS
  Administered 2022-01-27: 2 [IU] via SUBCUTANEOUS
  Administered 2022-01-28: 5 [IU] via SUBCUTANEOUS
  Administered 2022-01-28 (×3): 1 [IU] via SUBCUTANEOUS
  Administered 2022-01-28: 2 [IU] via SUBCUTANEOUS
  Administered 2022-01-29: 1 [IU] via SUBCUTANEOUS
  Administered 2022-01-29: 3 [IU] via SUBCUTANEOUS
  Administered 2022-01-29: 2 [IU] via SUBCUTANEOUS
  Administered 2022-01-29: 1 [IU] via SUBCUTANEOUS
  Administered 2022-01-29: 3 [IU] via SUBCUTANEOUS
  Administered 2022-01-30: 1 [IU] via SUBCUTANEOUS
  Administered 2022-01-30: 2 [IU] via SUBCUTANEOUS
  Administered 2022-01-30: 1 [IU] via SUBCUTANEOUS
  Administered 2022-01-30: 2 [IU] via SUBCUTANEOUS
  Administered 2022-01-31: 3 [IU] via SUBCUTANEOUS

## 2022-01-25 MED ORDER — HYDRALAZINE HCL 20 MG/ML IJ SOLN: 10.0000 mg | Freq: Three times a day (TID) | INTRAMUSCULAR | Status: DC | PRN

## 2022-01-25 MED ORDER — ENOXAPARIN SODIUM 40 MG/0.4ML IJ SOSY
40.0000 mg | PREFILLED_SYRINGE | INTRAMUSCULAR | Status: DC
  Administered 2022-01-25 – 2022-01-31 (×7): 40 mg via SUBCUTANEOUS
  Filled 2022-01-25 (×7): qty 0.4

## 2022-01-25 MED ORDER — ONDANSETRON HCL 4 MG/2ML IJ SOLN
4.0000 mg | Freq: Once | INTRAMUSCULAR | Status: AC
  Administered 2022-01-25: 4 mg via INTRAVENOUS
  Filled 2022-01-25: qty 2

## 2022-01-25 MED ORDER — ACETAMINOPHEN 650 MG RE SUPP: 650.0000 mg | Freq: Four times a day (QID) | RECTAL | Status: DC | PRN

## 2022-01-25 MED ORDER — FENTANYL CITRATE PF 50 MCG/ML IJ SOSY
25.0000 ug | PREFILLED_SYRINGE | Freq: Once | INTRAMUSCULAR | Status: AC
  Administered 2022-01-25: 25 ug via INTRAVENOUS
  Filled 2022-01-25: qty 1

## 2022-01-25 MED ORDER — FENTANYL CITRATE PF 50 MCG/ML IJ SOSY
50.0000 ug | PREFILLED_SYRINGE | Freq: Once | INTRAMUSCULAR | Status: AC
  Administered 2022-01-25: 50 ug via INTRAVENOUS
  Filled 2022-01-25: qty 1

## 2022-01-25 MED ORDER — METOPROLOL SUCCINATE ER 50 MG PO TB24
50.0000 mg | ORAL_TABLET | Freq: Every day | ORAL | Status: DC
  Administered 2022-01-25 – 2022-01-31 (×6): 50 mg via ORAL
  Filled 2022-01-25 (×6): qty 1
  Filled 2022-01-25: qty 2

## 2022-01-25 MED ORDER — ACETAMINOPHEN 325 MG PO TABS: 650.0000 mg | ORAL_TABLET | Freq: Four times a day (QID) | ORAL | Status: DC | PRN

## 2022-01-25 MED ORDER — HYDROMORPHONE HCL 1 MG/ML IJ SOLN
1.0000 mg | INTRAMUSCULAR | Status: DC | PRN
  Administered 2022-01-25 – 2022-01-31 (×18): 1 mg via INTRAVENOUS
  Filled 2022-01-25 (×18): qty 1

## 2022-01-25 NOTE — Plan of Care (Signed)
  Problem: Coping: Goal: Level of anxiety will decrease Outcome: Progressing   Problem: Pain Managment: Goal: General experience of comfort will improve Outcome: Progressing   

## 2022-01-25 NOTE — ED Triage Notes (Signed)
Pt brought to E by [facility] via wheelchair to triage with c/o upper abdominal and chest pain since Monday night. Also endorses flank pain and two rounds of vomiting and diarrhea.  ?

## 2022-01-25 NOTE — ED Provider Notes (Signed)
[facility] EMERGENCY DEPARTMENT Provider Note   CSN: [id number] Arrival date & time: 01/25/22  0024     History  Chief Complaint  Patient presents with   Abdominal Pain   Chest Pain    [name] is a 76 y.o. female.  Presenting to ER with concern for chest pain, abdominal pain, back pain.  Reports that most of her pain is in her abdomen and seems to radiate to her back.  Severe, up to 10 out of 10 in severity.  Sharp and stabbing.  Similar to prior pain that she suffered from.  Reports history of pancreatitis.  Reviewed care everywhere, seen by specialist at [facility], also followed by Dr. Paulita Fujita with [facility]. 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 Final pathology demonstrated focal pancreatitis and no evidence of malignanc  HPI     Home Medications Prior to Admission medications   Medication Sig Start Date End Date Taking? Authorizing Provider  Accu-Chek Softclix Lancets lancets Use as instructed to check blood sugar 4 times daily Dx E11.51 04/28/21   Renato Shin, MD  amLODipine (NORVASC) 5 MG tablet Take 5 mg by mouth daily. 10/24/16   [provider]  aspirin EC 81 MG tablet Take 81 mg by mouth at bedtime.    [provider]  Blood Glucose Monitoring Suppl (ACCU-CHEK GUIDE ME) w/Device KIT Use to check blood sugar 4 times daily 04/28/21   Renato Shin, MD  Continuous Blood Gluc Sensor (FREESTYLE LIBRE 2 SENSOR) MISC 1 Device by Does not apply route every 14 (fourteen) days. 10/24/21   Renato Shin, MD  gabapentin (NEURONTIN) 300 MG capsule TAKE 1 CAPSULE BY MOUTH THREE TIMES A DAY 08/18/19   Elam Dutch, MD  glucose blood (ACCU-CHEK GUIDE) test strip USE AS DIRECTED 4 TIMES  DAILY 10/04/21   Renato Shin, MD  lovastatin (MEVACOR) 20 MG tablet Take 20 mg by mouth at bedtime.    [provider]  metFORMIN (GLUCOPHAGE-XR) 500 MG 24 hr tablet Take 4 tablets (2,000 mg total) by mouth daily. 01/23/22   Renato Shin,  MD  metoprolol succinate (TOPROL-XL) 100 MG 24 hr tablet Take 0.5 tablets (50 mg total) by mouth daily. 12/03/20   Renato Shin, MD  Netarsudil-Latanoprost (ROCKLATAN) 0.02-0.005 % SOLN Apply to eye.    [provider]  OVER THE COUNTER MEDICATION Apply 1 patch topically daily as needed (pain). Apply to right shoulder    [provider]  pantoprazole (PROTONIX) 40 MG tablet Take 40 mg by mouth daily. 10/25/15   [provider]  PROAIR HFA 108 (90 BASE) MCG/ACT inhaler Inhale 2 puffs into the lungs every 4 (four) hours as needed for wheezing or shortness of breath. Reported on 11/04/2015 09/02/13   [provider]  [place] 0.3ML/31G 31G X 5/16" 0.3 ML MISC USE 1 SYRINGE IN THE MORNING WITH  INSULIN 12/30/21   Renato Shin, MD  repaglinide (PRANDIN) 2 MG tablet TAKE 2 TABLETS BY MOUTH  TWICE DAILY BEFORE MEALS 11/25/21   Renato Shin, MD  temazepam (RESTORIL) 30 MG capsule Take 30 mg by mouth at bedtime as needed for sleep. 04/15/18   [provider]      Allergies    Adhesive [tape]    Review of Systems   Review of Systems  Physical Exam Updated Vital Signs BP (!) 154/78    Pulse (!) 58    Temp 98.8 F (37.1 C) (Oral)    Resp 13  Ht '5\' 3"'  (1.6 m)    Wt 54 kg    SpO2 95%    BMI 21.08 kg/m  Physical Exam  ED Results / Procedures / Treatments   Labs (all labs ordered are listed, but only abnormal results are displayed) Labs Reviewed  COMPREHENSIVE METABOLIC PANEL - Abnormal; Notable for the following components:      Result Value   Glucose, Bld 137 (*)    Total Protein 6.3 (*)    Albumin 3.1 (*)    Total Bilirubin 0.2 (*)    All other components within normal limits  LIPASE, BLOOD - Abnormal; Notable for the following components:   Lipase 70 (*)    All other components within normal limits  CBC WITH DIFFERENTIAL/PLATELET  URINALYSIS, ROUTINE W REFLEX MICROSCOPIC  TYPE AND SCREEN    EKG None  Radiology CT ABDOMEN PELVIS  W CONTRAST  Result Date: 01/25/2022 CLINICAL DATA:  Upper abdominal pain with vomiting and diarrhea. EXAM: CT ABDOMEN AND PELVIS WITH CONTRAST TECHNIQUE: Multidetector CT imaging of the abdomen and pelvis was performed using the standard protocol following bolus administration of intravenous contrast. RADIATION DOSE REDUCTION: This exam was performed according to the departmental dose-optimization program which includes automated exposure control, adjustment of the mA and/or kV according to patient size and/or use of iterative reconstruction technique. CONTRAST:  190m OMNIPAQUE IOHEXOL 300 MG/ML  SOLN COMPARISON:  October 11, 2020 FINDINGS: Lower chest: No acute abnormality. Hepatobiliary: No focal liver abnormality is seen. Status post cholecystectomy. No biliary dilatation. Pancreas: Evidence of prior Whipple procedure is seen with moderate to marked severity peripancreatic inflammatory fat stranding. Mild amount of peripancreatic fluid is also noted. The pancreatic duct is dilated and measures approximately 7 mm in diameter (measured approximately 4 mm on the prior study). Spleen: Normal in size without focal abnormality. Adrenals/Urinary Tract: Adrenal glands are unremarkable. Kidneys are normal, without renal calculi, focal lesion, or hydronephrosis. Bladder is unremarkable. Stomach/Bowel: Stomach is within normal limits. The appendix is not visualized. No evidence of bowel dilatation. Vascular/Lymphatic: Marked severity calcification of the abdominal aorta and bilateral common iliac arteries. Marked severity calcification and atherosclerosis is seen along the origin and proximal portion of the right renal artery. No enlarged abdominal or pelvic lymph nodes. Reproductive: Uterus and bilateral adnexa are unremarkable. Other: No abdominal wall hernia or abnormality. No abdominopelvic ascites. Musculoskeletal: Degenerative changes are seen the lumbar spine, most prominent at the level of L5-S1. IMPRESSION: 1.  Evidence of prior Whipple procedure with moderate to marked severity acute pancreatitis. 2. Evidence of prior cholecystectomy. 3. Aortic atherosclerosis. Aortic Atherosclerosis (ICD10-I70.0). Electronically Signed   By: TVirgina NorfolkM.D.   On: 01/25/2022 02:40    Procedures Procedures    Medications Ordered in ED Medications  lactated ringers infusion ( Intravenous New Bag/Given 01/25/22 0742)  iohexol (OMNIPAQUE) 300 MG/ML solution 100 mL (100 mLs Intravenous Contrast Given 01/25/22 0225)  oxyCODONE-acetaminophen (PERCOCET/ROXICET) 5-325 MG per tablet 1 tablet (1 tablet Oral Given 01/25/22 0447)  fentaNYL (SUBLIMAZE) injection 50 mcg (50 mcg Intravenous Given 01/25/22 0742)  ondansetron (ZOFRAN) injection 4 mg (4 mg Intravenous Given 01/25/22 0742)  fentaNYL (SUBLIMAZE) injection 25 mcg (25 mcg Intravenous Given 01/25/22 0950)    ED Course/ Medical Decision Making/ A&P                           Medical Decision Making Risk Prescription drug management. Decision regarding hospitalization.   76y.o. female s/p pylorus preserving  whipple for suspected PNET. Final pathology demonstrated focal pancreatitis and no evidence of malignancy.  She presents today for worsening abdominal pain.  On exam she was initially noted to be tearful, very uncomfortable secondary to pain and had tenderness in the epigastric region.  Lipase mildly elevated, CT scan demonstrating marked severity of pancreatitis.  Suspect this is etiology for her pain today.  On reassessment her pain is improved but still having significant ongoing pain.  Given severity of pain, her complex history, feel that she would benefit from admission for further observation and management.  I discussed the case with [facility], Dr. Sloan Leiter who will admit.   Additional history obtained from extensive chart review, review of [facility] care everywhere notes.   I independently reviewed CT imaging.        Final Clinical Impression(s) / ED  Diagnoses Final diagnoses:  Acute pancreatitis, unspecified complication status, unspecified pancreatitis type    Rx / DC Orders ED Discharge Orders     None         Lucrezia Starch, MD 01/25/22 1016

## 2022-01-25 NOTE — H&P (Addendum)
HISTORY AND PHYSICAL       PATIENT DETAILS Name: Jill Higgins Age: 76 y.o. Sex: female Date of Birth: [birth year] Admit Date: 01/25/2022 VOP:FYTWK, Hal Hope, MD   Patient coming from: Home   CHIEF COMPLAINT:  Upper abdominal pain x1 day  HPI: Jill Higgins is a 76 y.o. female with medical history significant of chronic pancreatitis of unknown etiology (s/p Whipple's procedure in 10/2019-as a FNA on 08/2019 was suspicious for tumor-however pathology from Whipple's procedure showed multifocal pancreatitis) presented to the ED earlier this morning with upper abdominal pain.  Per patient-her upper abdominal pain is very reminiscent of her prior episodes of pancreatitis.  She has had 1-2 episodes of vomiting earlier this morning.  Denies diarrhea.  Per patient-pain is 10/10 at its intensity-and radiates to her back.  No history of fever, diarrhea, chest pain or shortness of breath.  ED Course: Lipase minimally elevated-however CT abdomen showed pancreatitis.  Hospitalist called to admit patient for further evaluation/treatment.   REVIEW OF SYSTEMS:  Constitutional:   No  weight loss, night sweats,  Fevers, chills, fatigue.  HEENT:    No headaches, Dysphagia,Tooth/dental problems,Sore throat,  No sneezing, itching, ear ache, nasal congestion, post nasal drip  Cardio-vascular: No chest pain,Orthopnea, PND,lower extremity edema, anasarca, palpitations  GI:  No diarrhea, melena or hematochezia  Resp: No shortness of breath, cough, hemoptysis,plueritic chest pain.   Skin:  No rash or lesions.  GU:  No dysuria, change in color of urine, no urgency or frequency.  No flank pain.  Musculoskeletal: No joint pain or swelling.  No decreased range of motion.  No back pain.  Endocrine: No heat intolerance, no cold intolerance, no polyuria, no polydipsia  Psych: No change in mood or affect. No depression or anxiety.  No memory loss.   ALLERGIES:   Allergies   Allergen Reactions   Adhesive [Tape] Other (See Comments)    Pt states that tape and electrodes leave red "scars" on her skin and her skin is very sensitive.    PAST MEDICAL HISTORY: Past Medical History:  Diagnosis Date   Bronchitis    Bulging disc    in neck   Depression with anxiety    Diabetes mellitus    Type II   Fibromyalgia    pt. denies   GERD (gastroesophageal reflux disease)    Glaucoma    History of bronchitis    Hyperlipidemia    Hypertension    Overactive bladder    Peripheral vascular disease (HCC)    Pneumonia    Sleep apnea    recent test negative for sleep apnea   Tobacco abuse    Urinary frequency     PAST SURGICAL HISTORY: Past Surgical History:  Procedure Laterality Date   ABDOMINAL AORTAGRAM N/A 08/15/2013   Procedure: ABDOMINAL Maxcine Ham;  Surgeon: Elam Dutch, MD;  Location: [facility] CATH LAB;  Service: Cardiovascular;  Laterality: N/A;   ABDOMINAL AORTAGRAM N/A 06/19/2014   Procedure: ABDOMINAL Maxcine Ham;  Surgeon: Elam Dutch, MD;  Location: [facility] CATH LAB;  Service: Cardiovascular;  Laterality: N/A;   ABDOMINAL AORTOGRAM W/LOWER EXTREMITY N/A 05/10/2018   Procedure: ABDOMINAL AORTOGRAM W/LOWER EXTREMITY;  Surgeon: Elam Dutch, MD;  Location: [place] CV LAB;  Service: Cardiovascular;  Laterality: N/A;  bilateral   Aortogram w/ PTA  05/14/08,  11-04-10   Bilateral aortogram w/ bilateral  SFA PTA  stenting    BREAST SURGERY Right    boil removal  BUNIONECTOMY     L foot in the 1980s   COLONOSCOPY     2014   DILATION AND CURETTAGE OF UTERUS     X4   ENDARTERECTOMY FEMORAL Right 06/03/2018   Procedure: RIGHT FEMORAL ENDARTERECTOMY;  Surgeon: Elam Dutch, MD;  Location: [place];  Service: Vascular;  Laterality: Right;   Epidural shots in neck      ESOPHAGOGASTRODUODENOSCOPY (EGD) WITH PROPOFOL N/A 09/15/2019   Procedure: ESOPHAGOGASTRODUODENOSCOPY (EGD) WITH PROPOFOL;  Surgeon: Arta Silence, MD;  Location: [facility] ENDOSCOPY;   Service: Endoscopy;  Laterality: N/A;   EUS N/A 09/15/2019   Procedure: UPPER ENDOSCOPIC ULTRASOUND (EUS) LINEAR;  Surgeon: Arta Silence, MD;  Location: [facility] ENDOSCOPY;  Service: Endoscopy;  Laterality: N/A;   EYE SURGERY Bilateral 2016   cataract removal   FEMORAL-POPLITEAL BYPASS GRAFT Left 10/21/2013   Procedure: LEFT FEMORAL-POPLITEAL ARTERY BYPASS WITH SAPHENOUS VEIN GRAFT , POPLITEAL ENDARTERECTOMY ,INTRAOPERATIVE ARTERIOGRAM, vein patch angioplasty to popliteal artery;  Surgeon: Elam Dutch, MD;  Location: [facility] OR;  Service: Vascular;  Laterality: Left;   FEMORAL-POPLITEAL BYPASS GRAFT Right 02/08/2016   Procedure: Right FEMORAL- to Above Knee POPLITEAL ARTERY Bypass Graft with reversed saphenous vein and Common Femoral Endarterectomy  with profundoplasty;  Surgeon: Elam Dutch, MD;  Location: [place];  Service: Vascular;  Laterality: Right;   FINE NEEDLE ASPIRATION N/A 09/15/2019   Procedure: FINE NEEDLE ASPIRATION (FNA) LINEAR;  Surgeon: Arta Silence, MD;  Location: [facility] ENDOSCOPY;  Service: Endoscopy;  Laterality: N/A;   GROIN DEBRIDEMENT Left 10/28/2013   Procedure: left inner thigh DEBRIDEMENT;  Surgeon: Elam Dutch, MD;  Location: [facility] OR;  Service: Vascular;  Laterality: Left;   HAND SURGERY Left 2010   LOWER EXTREMITY ANGIOGRAM Bilateral 12/24/2015   Procedure: Lower Extremity Angiogram;  Surgeon: Elam Dutch, MD;  Location: [place] CV LAB;  Service: Cardiovascular;  Laterality: Bilateral;   PATCH ANGIOPLASTY Right 06/03/2018   Procedure: PATCH ANGIOPLASTY USING A XENOSURE 1CM X 14CM BIOLOGIC PATCH;  Surgeon: Elam Dutch, MD;  Location: [facility] OR;  Service: Vascular;  Laterality: Right;   PERIPHERAL VASCULAR CATHETERIZATION N/A 12/24/2015   Procedure: Abdominal Aortogram;  Surgeon: Elam Dutch, MD;  Location: [place] CV LAB;  Service: Cardiovascular;  Laterality: N/A;   PERIPHERAL VASCULAR CATHETERIZATION Left 12/24/2015   Procedure: Peripheral Vascular  Balloon Angioplasty;  Surgeon: Elam Dutch, MD;  Location: [place] CV LAB;  Service: Cardiovascular;  Laterality: Left;  drug coated balloon   PERIPHERAL VASCULAR CATHETERIZATION N/A 06/30/2016   Procedure: Abdominal Aortogram;  Surgeon: Elam Dutch, MD;  Location: [place] CV LAB;  Service: Cardiovascular;  Laterality: N/A;   PERIPHERAL VASCULAR CATHETERIZATION Bilateral 06/30/2016   Procedure: Lower Extremity Angiography;  Surgeon: Elam Dutch, MD;  Location: [place] CV LAB;  Service: Cardiovascular;  Laterality: Bilateral;   TONSILLECTOMY      MEDICATIONS AT HOME: Prior to Admission medications   Medication Sig Start Date End Date Taking? Authorizing Provider  Accu-Chek Softclix Lancets lancets Use as instructed to check blood sugar 4 times daily Dx E11.51 04/28/21   Renato Shin, MD  amLODipine (NORVASC) 5 MG tablet Take 5 mg by mouth daily. 10/24/16   [provider]  aspirin EC 81 MG tablet Take 81 mg by mouth at bedtime.    [provider]  Blood Glucose Monitoring Suppl (ACCU-CHEK GUIDE ME) w/Device KIT Use to check blood sugar 4 times daily 04/28/21   Renato Shin, MD  Continuous Blood Gluc  Sensor (FREESTYLE LIBRE 2 SENSOR) MISC 1 Device by Does not apply route every 14 (fourteen) days. 10/24/21   Renato Shin, MD  gabapentin (NEURONTIN) 300 MG capsule TAKE 1 CAPSULE BY MOUTH THREE TIMES A DAY 08/18/19   Elam Dutch, MD  glucose blood (ACCU-CHEK GUIDE) test strip USE AS DIRECTED 4 TIMES  DAILY 10/04/21   Renato Shin, MD  lovastatin (MEVACOR) 20 MG tablet Take 20 mg by mouth at bedtime.    [provider]  metFORMIN (GLUCOPHAGE-XR) 500 MG 24 hr tablet Take 4 tablets (2,000 mg total) by mouth daily. 01/23/22   Renato Shin, MD  metoprolol succinate (TOPROL-XL) 100 MG 24 hr tablet Take 0.5 tablets (50 mg total) by mouth daily. 12/03/20   Renato Shin, MD  Netarsudil-Latanoprost (ROCKLATAN) 0.02-0.005 % SOLN Apply to eye.    [provider]  OVER THE COUNTER MEDICATION Apply 1 patch topically daily as needed (pain). Apply to right shoulder    [provider]  pantoprazole (PROTONIX) 40 MG tablet Take 40 mg by mouth daily. 10/25/15   [provider]  PROAIR HFA 108 (90 BASE) MCG/ACT inhaler Inhale 2 puffs into the lungs every 4 (four) hours as needed for wheezing or shortness of breath. Reported on 11/04/2015 09/02/13   [provider]  [place] 0.3ML/31G 31G X 5/16" 0.3 ML MISC USE 1 SYRINGE IN THE MORNING WITH  INSULIN 12/30/21   Renato Shin, MD  repaglinide (PRANDIN) 2 MG tablet TAKE 2 TABLETS BY MOUTH  TWICE DAILY BEFORE MEALS 11/25/21   Renato Shin, MD  temazepam (RESTORIL) 30 MG capsule Take 30 mg by mouth at bedtime as needed for sleep. 04/15/18   [provider]    FAMILY HISTORY: Family History  Problem Relation Age of Onset   Heart disease Father    Heart attack Father        MI at age 76   Hypertension Mother    Alzheimer's disease Mother    Hypertension Sister    Diabetes Brother    Hyperlipidemia Brother    Hypertension Brother    Heart disease Brother    Heart attack Brother    Breast cancer Maternal Aunt       SOCIAL HISTORY:  reports that she quit smoking about 7 years ago. Her smoking use included cigarettes. She smoked an average of .1 packs per day. She has never used smokeless tobacco. She reports current alcohol use. She reports that she does not use drugs.  PHYSICAL EXAM: Blood pressure (!) 161/97, pulse 63, temperature 98.8 F (37.1 C), temperature source Oral, resp. rate 12, height _0  (1.6 m), weight 54 kg, SpO2 98 %.  General appearance :Awake, alert, not in any distress.  Eyes:, pupils equally reactive to light and accomodation,no scleral icterus.Pink conjunctiva HEENT: Atraumatic and Normocephalic Neck: supple, no JVD.  Resp:Good air entry bilaterally, no added sounds  CVS: S1 S2 regular, no murmurs.  GI: Bowel sounds  present, moderately tender in the upper abdomen with no peritoneal signs. Extremities: B/L Lower Ext shows no edema, both legs are warm to touch Neurology:  speech clear,Non focal, sensation is grossly intact. Psychiatric: Normal judgment and insight. Alert and oriented x 3. Normal mood. Musculoskeletal:gait appears to be normal.No digital cyanosis Skin:No Rash, warm and dry Wounds:N/A  LABS ON ADMISSION:  I have personally reviewed following labs and imaging studies  CBC: Recent Labs  Lab 01/25/22 0042  WBC 8.6  NEUTROABS 6.3  HGB 12.8  HCT  40.1  MCV 95.9  PLT 280    Basic Metabolic Panel: Recent Labs  Lab 01/25/22 0042  NA 136  K 3.6  CL 100  CO2 27  GLUCOSE 137*  BUN 11  CREATININE 0.75  CALCIUM 8.9    GFR: Estimated Creatinine Clearance: 49.5 mL/min (by C-G formula based on SCr of 0.75 mg/dL).  Liver Function Tests: Recent Labs  Lab 01/25/22 0042  AST 19  ALT 12  ALKPHOS 67  BILITOT 0.2*  PROT 6.3*  ALBUMIN 3.1*   Recent Labs  Lab 01/25/22 0042  LIPASE 70*   No results for input(s): AMMONIA in the last 168 hours.  Coagulation Profile: No results for input(s): INR, PROTIME in the last 168 hours.  Cardiac Enzymes: No results for input(s): CKTOTAL, CKMB, CKMBINDEX, TROPONINI in the last 168 hours.  BNP (last 3 results) No results for input(s): PROBNP in the last 8760 hours.  HbA1C: Recent Labs    01/23/22 1419  HGBA1C 6.3*    CBG: No results for input(s): GLUCAP in the last 168 hours.  Lipid Profile: No results for input(s): CHOL, HDL, LDLCALC, TRIG, CHOLHDL, LDLDIRECT in the last 72 hours.  Thyroid Function Tests: No results for input(s): TSH, T4TOTAL, FREET4, T3FREE, THYROIDAB in the last 72 hours.  Anemia Panel: No results for input(s): VITAMINB12, FOLATE, FERRITIN, TIBC, IRON, RETICCTPCT in the last 72 hours.  Urine analysis:    Component Value Date/Time   COLORURINE AMBER (A) 08/22/2019 2006   APPEARANCEUR CLOUDY (A)  08/22/2019 2006   LABSPEC 1.029 08/22/2019 2006   PHURINE 5.0 08/22/2019 2006   GLUCOSEU NEGATIVE 08/22/2019 2006   HGBUR NEGATIVE 08/22/2019 2006   BILIRUBINUR SMALL (A) 08/22/2019 2006   KETONESUR NEGATIVE 08/22/2019 2006   PROTEINUR 30 (A) 08/22/2019 2006   UROBILINOGEN 0.2 09/29/2013 1359   NITRITE NEGATIVE 08/22/2019 2006   LEUKOCYTESUR NEGATIVE 08/22/2019 2006    Sepsis Labs: Lactic Acid, Venous    Component Value Date/Time   LATICACIDVEN 1.0 08/22/2019 2216     Microbiology: No results found for this or any previous visit (from the past 240 hour(s)).    RADIOLOGIC STUDIES ON ADMISSION: CT ABDOMEN PELVIS W CONTRAST  Result Date: 01/25/2022 CLINICAL DATA:  Upper abdominal pain with vomiting and diarrhea. EXAM: CT ABDOMEN AND PELVIS WITH CONTRAST TECHNIQUE: Multidetector CT imaging of the abdomen and pelvis was performed using the standard protocol following bolus administration of intravenous contrast. RADIATION DOSE REDUCTION: This exam was performed according to the departmental dose-optimization program which includes automated exposure control, adjustment of the mA and/or kV according to patient size and/or use of iterative reconstruction technique. CONTRAST:  145m OMNIPAQUE IOHEXOL 300 MG/ML  SOLN COMPARISON:  October 11, 2020 FINDINGS: Lower chest: No acute abnormality. Hepatobiliary: No focal liver abnormality is seen. Status post cholecystectomy. No biliary dilatation. Pancreas: Evidence of prior Whipple procedure is seen with moderate to marked severity peripancreatic inflammatory fat stranding. Mild amount of peripancreatic fluid is also noted. The pancreatic duct is dilated and measures approximately 7 mm in diameter (measured approximately 4 mm on the prior study). Spleen: Normal in size without focal abnormality. Adrenals/Urinary Tract: Adrenal glands are unremarkable. Kidneys are normal, without renal calculi, focal lesion, or hydronephrosis. Bladder is unremarkable.  Stomach/Bowel: Stomach is within normal limits. The appendix is not visualized. No evidence of bowel dilatation. Vascular/Lymphatic: Marked severity calcification of the abdominal aorta and bilateral common iliac arteries. Marked severity calcification and atherosclerosis is seen along the origin and proximal portion of the right  renal artery. No enlarged abdominal or pelvic lymph nodes. Reproductive: Uterus and bilateral adnexa are unremarkable. Other: No abdominal wall hernia or abnormality. No abdominopelvic ascites. Musculoskeletal: Degenerative changes are seen the lumbar spine, most prominent at the level of L5-S1. IMPRESSION: 1. Evidence of prior Whipple procedure with moderate to marked severity acute pancreatitis. 2. Evidence of prior cholecystectomy. 3. Aortic atherosclerosis. Aortic Atherosclerosis (ICD10-I70.0). Electronically Signed   By: Virgina Norfolk M.D.   On: 01/25/2022 02:40     EKG:    ASSESSMENT AND PLAN: Acute on chronic pancreatitis: Etiology unknown-does not have a gallbladder as she is s/p Whipple's procedure.  No significant history of EtOH intake.  Has had extensive work-up in the past which was negative for pancreatic division and autoimmune pancreatitis.  Plan is to provide supportive care-n.p.o.-but okay for sips/chips, as needed IV narcotics and antiemetics.  GI consulted.  HTN: Continue amlodipine and metoprolol-follow and adjust.  DM-2: Patient reports that she was recently taken off insulin by her outpatient physicians-we will place on SSI and follow trend.  Hold all oral hypoglycemic agents for now.  HLD: Hold statins until diet is a bit more stable.  Peripheral neuropathy: Holding Neurontin for now-we will be on as needed IV narcotics.  History of PAD-s/p femoral endarterectomy/right femoropopliteal bypass 2014-and angioplasty in 2017: Stable for outpatient follow-up with vascular surgery-on aspirin.  Further plan will depend as patient's clinical course  evolves and further radiologic and laboratory data become available. Patient will be monitored closely.  Above noted plan was discussed with patient/daughter face to face at bedside, they were in agreement.   CONSULTS: None   DVT Prophylaxis: Prophylactic Lovenox   Code Status: Full Code   Disposition Plan: Home in the next 2-3 days.  Admission status:  Inpatient v going to tele   The medical decision making on this patient was of high complexity and the patient is at high risk for clinical deterioration, therefore this is a level 3 visit.    Total time spent  55 minutes.Greater than 50% of this time was spent in counseling, explanation of diagnosis, planning of further management, and coordination of care.  Severity of illness:  The appropriate patient status for this patient is INPATIENT. Inpatient status is judged to be reasonable and necessary in order to provide the required intensity of service to ensure the patient's safety. The patient's presenting symptoms, physical exam findings, and initial radiographic and laboratory data in the context of their chronic comorbidities is felt to place them at high risk for further clinical deterioration. Furthermore, it is not anticipated that the patient will be medically stable for discharge from the hospital within 2 midnights of admission. The following factors support the patient status of inpatient.   " The patient's presenting symptoms include severe abdominal pain. " The worrisome physical exam findings include tender abdomen. " The initial radiographic and laboratory data are worrisome because of marked severity pancreatitis and CT. " The chronic co-morbidities include chronic pancreatitis..   * I certify that at the point of admission it is my clinical judgment that the patient will require inpatient hospital care spanning beyond 2 midnights from the point of admission due to high intensity of service, high risk for further  deterioration and high frequency of surveillance required.**  Shirley Decamp [facility] Pager [phone]  If 7PM-7AM, please contact night-coverage  Please page via [email or web address]  Go to [email or web address] and use [employer]'s universal password to access. If you do not have the password, please  contact the hospital operator.  Locate the [facility] provider you are looking for under [facility] and page to a number that you can be directly reached. If you still have difficulty reaching the provider, please page the [facility] (Director on Call) for the Hospitalists listed on amion for assistance.  01/25/2022, 10:55 AM

## 2022-01-25 NOTE — Consult Note (Signed)
[facility] Consult  Referring Provider: Dr.Shanker Ghimire/[facility] hospital[facility] Primary Care Physician:  Carol Ada, MD Primary Gastroenterologist: Dr. Stacie Glaze GI  Reason for Consultation: Acute on chronic pancreatitis  HPI: Jill Higgins is a 76 y.o. female was in her usual state of health until yesterday morning when she woke up from sleep with severe epigastric pain that radiated to left upper quadrant, right upper quadrant and the back associated with severe nausea and few episodes of vomiting. Patient tried using Tylenol and Pepto-Bismol without relief of severe abdominal pain and finally called 911 to come to the ER. Patient states she drinks 1 pia colada every 1 to 2 weeks, last pia colada usage was 1 week ago. She denies any more alcohol consumption.  Patient has had a complicated past medical history for which she required a pylorus preserving Whipple in 10/2019. EUS with FNA from 08/2019 showed neoplastic cells suspicious for pancreatic neuroendocrine tumor, history of elevated chromogranin A. Prior imaging showed mass in head of pancreas with pancreatic duct dilatation. Pathology from Whipple's showed pancreatic intraepithelial neoplasia, low-grade, multifocal, chronic pancreatitis, 16 negative lymph nodes and normal gallbladder. She takes pantoprazole once a day and denies acid reflux or heartburn. Intermittently she may have difficulty swallowing but denies recent change in appetite or unintentional weight loss. Prior to Whipple surgery she weighed around 190 pounds and weighs around 110 to 220 pounds for the last 2 years. Patient states she has several loose, watery bowel movements sometimes up to 10 a day, intermittent nocturnal diarrhea without blood in stool, occasionally she sees black stools(takes Pepto-Bismol intermittently).  She used to be on Creon however has not been on Creon since January. Recently she was given samples of Zenpep by Dr. Paulita Fujita in  the office, however does not report improvement in diarrhea despite pancreatic enzyme supplementation.  Colonoscopy 01/16/2022, iron deficiency anemia, diarrhea: Normal entire colon, random biopsies taken, pathology pending. EGD 8/22: Small hiatal hernia, 10 cm Roux limb, evidence of Whipple's   Past Medical History:  Diagnosis Date   Bronchitis    Bulging disc    in neck   Depression with anxiety    Diabetes mellitus    Type II   Fibromyalgia    pt. denies   GERD (gastroesophageal reflux disease)    Glaucoma    History of bronchitis    Hyperlipidemia    Hypertension    Overactive bladder    Peripheral vascular disease (HCC)    Pneumonia    Sleep apnea    recent test negative for sleep apnea   Tobacco abuse    Urinary frequency     Past Surgical History:  Procedure Laterality Date   ABDOMINAL AORTAGRAM N/A 08/15/2013   Procedure: ABDOMINAL Maxcine Ham;  Surgeon: Elam Dutch, MD;  Location: [facility] CATH LAB;  Service: Cardiovascular;  Laterality: N/A;   ABDOMINAL AORTAGRAM N/A 06/19/2014   Procedure: ABDOMINAL Maxcine Ham;  Surgeon: Elam Dutch, MD;  Location: [facility] CATH LAB;  Service: Cardiovascular;  Laterality: N/A;   ABDOMINAL AORTOGRAM W/LOWER EXTREMITY N/A 05/10/2018   Procedure: ABDOMINAL AORTOGRAM W/LOWER EXTREMITY;  Surgeon: Elam Dutch, MD;  Location: [place] CV LAB;  Service: Cardiovascular;  Laterality: N/A;  bilateral   Aortogram w/ PTA  05/14/08,  11-04-10   Bilateral aortogram w/ bilateral  SFA PTA  stenting    BREAST SURGERY Right    boil removal   BUNIONECTOMY     L foot in the 1980s   COLONOSCOPY     2014  DILATION AND CURETTAGE OF UTERUS     X4   ENDARTERECTOMY FEMORAL Right 06/03/2018   Procedure: RIGHT FEMORAL ENDARTERECTOMY;  Surgeon: Elam Dutch, MD;  Location: [facility] OR;  Service: Vascular;  Laterality: Right;   Epidural shots in neck      ESOPHAGOGASTRODUODENOSCOPY (EGD) WITH PROPOFOL N/A 09/15/2019   Procedure:  ESOPHAGOGASTRODUODENOSCOPY (EGD) WITH PROPOFOL;  Surgeon: Arta Silence, MD;  Location: [facility] ENDOSCOPY;  Service: Endoscopy;  Laterality: N/A;   EUS N/A 09/15/2019   Procedure: UPPER ENDOSCOPIC ULTRASOUND (EUS) LINEAR;  Surgeon: Arta Silence, MD;  Location: [facility] ENDOSCOPY;  Service: Endoscopy;  Laterality: N/A;   EYE SURGERY Bilateral 2016   cataract removal   FEMORAL-POPLITEAL BYPASS GRAFT Left 10/21/2013   Procedure: LEFT FEMORAL-POPLITEAL ARTERY BYPASS WITH SAPHENOUS VEIN GRAFT , POPLITEAL ENDARTERECTOMY ,INTRAOPERATIVE ARTERIOGRAM, vein patch angioplasty to popliteal artery;  Surgeon: Elam Dutch, MD;  Location: [facility] OR;  Service: Vascular;  Laterality: Left;   FEMORAL-POPLITEAL BYPASS GRAFT Right 02/08/2016   Procedure: Right FEMORAL- to Above Knee POPLITEAL ARTERY Bypass Graft with reversed saphenous vein and Common Femoral Endarterectomy  with profundoplasty;  Surgeon: Elam Dutch, MD;  Location: [place];  Service: Vascular;  Laterality: Right;   FINE NEEDLE ASPIRATION N/A 09/15/2019   Procedure: FINE NEEDLE ASPIRATION (FNA) LINEAR;  Surgeon: Arta Silence, MD;  Location: [facility] ENDOSCOPY;  Service: Endoscopy;  Laterality: N/A;   GROIN DEBRIDEMENT Left 10/28/2013   Procedure: left inner thigh DEBRIDEMENT;  Surgeon: Elam Dutch, MD;  Location: [facility] OR;  Service: Vascular;  Laterality: Left;   HAND SURGERY Left 2010   LOWER EXTREMITY ANGIOGRAM Bilateral 12/24/2015   Procedure: Lower Extremity Angiogram;  Surgeon: Elam Dutch, MD;  Location: [place] CV LAB;  Service: Cardiovascular;  Laterality: Bilateral;   PATCH ANGIOPLASTY Right 06/03/2018   Procedure: PATCH ANGIOPLASTY USING A XENOSURE 1CM X 14CM BIOLOGIC PATCH;  Surgeon: Elam Dutch, MD;  Location: [facility] OR;  Service: Vascular;  Laterality: Right;   PERIPHERAL VASCULAR CATHETERIZATION N/A 12/24/2015   Procedure: Abdominal Aortogram;  Surgeon: Elam Dutch, MD;  Location: [place] CV LAB;  Service: Cardiovascular;   Laterality: N/A;   PERIPHERAL VASCULAR CATHETERIZATION Left 12/24/2015   Procedure: Peripheral Vascular Balloon Angioplasty;  Surgeon: Elam Dutch, MD;  Location: [place] CV LAB;  Service: Cardiovascular;  Laterality: Left;  drug coated balloon   PERIPHERAL VASCULAR CATHETERIZATION N/A 06/30/2016   Procedure: Abdominal Aortogram;  Surgeon: Elam Dutch, MD;  Location: [place] CV LAB;  Service: Cardiovascular;  Laterality: N/A;   PERIPHERAL VASCULAR CATHETERIZATION Bilateral 06/30/2016   Procedure: Lower Extremity Angiography;  Surgeon: Elam Dutch, MD;  Location: [place] CV LAB;  Service: Cardiovascular;  Laterality: Bilateral;   TONSILLECTOMY      Prior to Admission medications   Medication Sig Start Date End Date Taking? Authorizing Provider  Accu-Chek Softclix Lancets lancets Use as instructed to check blood sugar 4 times daily Dx E11.51 04/28/21   Renato Shin, MD  aspirin EC 81 MG tablet Take 81 mg by mouth at bedtime.    [provider]  Blood Glucose Monitoring Suppl (ACCU-CHEK GUIDE ME) w/Device KIT Use to check blood sugar 4 times daily 04/28/21   Renato Shin, MD  Continuous Blood Gluc Sensor (FREESTYLE LIBRE 2 SENSOR) MISC 1 Device by Does not apply route every 14 (fourteen) days. 10/24/21   Renato Shin, MD  diclofenac Sodium (VOLTAREN) 1 % GEL Apply 1 application. topically daily. 01/05/22   [provider]  gabapentin (NEURONTIN) 300 MG capsule TAKE 1 CAPSULE BY MOUTH THREE TIMES A DAY 08/18/19   Elam Dutch, MD  glucose blood (ACCU-CHEK GUIDE) test strip USE AS DIRECTED 4 TIMES  DAILY 10/04/21   Renato Shin, MD  lisinopril-hydrochlorothiazide (ZESTORETIC) 20-25 MG tablet Take 1 tablet by mouth daily. 12/07/21   [provider]  lovastatin (MEVACOR) 20 MG tablet Take 20 mg by mouth at bedtime.    [provider]  metFORMIN (GLUCOPHAGE-XR) 500 MG 24 hr tablet Take 4 tablets (2,000 mg total) by mouth daily. 01/23/22    Renato Shin, MD  metoprolol succinate (TOPROL-XL) 100 MG 24 hr tablet Take 0.5 tablets (50 mg total) by mouth daily. 12/03/20   Renato Shin, MD  Netarsudil-Latanoprost (ROCKLATAN) 0.02-0.005 % SOLN Apply to eye.    [provider]  pantoprazole (PROTONIX) 40 MG tablet Take 40 mg by mouth daily. 10/25/15   [provider]  PROAIR HFA 108 (90 BASE) MCG/ACT inhaler Inhale 2 puffs into the lungs every 4 (four) hours as needed for wheezing or shortness of breath. Reported on 11/04/2015 09/02/13   [provider]  [place] 0.3ML/31G 31G X 5/16" 0.3 ML MISC USE 1 SYRINGE IN THE MORNING WITH  INSULIN 12/30/21   Renato Shin, MD  repaglinide (PRANDIN) 2 MG tablet TAKE 2 TABLETS BY MOUTH  TWICE DAILY BEFORE MEALS Patient taking differently: Take 2 mg by mouth 2 (two) times daily before a meal. 11/25/21   Renato Shin, MD  sertraline (ZOLOFT) 100 MG tablet Take 100 mg by mouth daily. 01/23/22   [provider]  temazepam (RESTORIL) 30 MG capsule Take 30 mg by mouth at bedtime as needed for sleep. 04/15/18   [provider]  timolol (TIMOPTIC) 0.5 % ophthalmic solution Place 1 drop into both eyes every morning. 12/20/21   [provider]    Current Facility-Administered Medications  Medication Dose Route Frequency Provider Last Rate Last Admin   acetaminophen (TYLENOL) tablet 650 mg  650 mg Oral Q6H PRN Ghimire, Henreitta Leber, MD       Or   acetaminophen (TYLENOL) suppository 650 mg  650 mg Rectal Q6H PRN Ghimire, Henreitta Leber, MD       amLODipine (NORVASC) tablet 5 mg  5 mg Oral Daily Ghimire, Shanker M, MD   5 mg at 01/25/22 1107   enoxaparin (LOVENOX) injection 40 mg  40 mg Subcutaneous Q24H Jonetta Osgood, MD   40 mg at 01/25/22 1109   hydrALAZINE (APRESOLINE) injection 10 mg  10 mg Intravenous Q8H PRN Ghimire, Henreitta Leber, MD       HYDROmorphone (DILAUDID) injection 1 mg  1 mg Intravenous Q4H PRN Jonetta Osgood, MD   1 mg at 01/25/22 1028    insulin aspart (novoLOG) injection 0-9 Units  0-9 Units Subcutaneous Q4H Ghimire, Henreitta Leber, MD       lactated ringers infusion   Intravenous Continuous Jonetta Osgood, MD 150 mL/hr at 01/25/22 1034 Infusion Verify at 01/25/22 1034   metoprolol succinate (TOPROL-XL) 24 hr tablet 50 mg  50 mg Oral Daily Jonetta Osgood, MD   50 mg at 01/25/22 1107   ondansetron (ZOFRAN) tablet 4 mg  4 mg Oral Q6H PRN Jonetta Osgood, MD       Or   ondansetron (ZOFRAN) injection 4 mg  4 mg Intravenous Q6H PRN Ghimire, Henreitta Leber, MD       Current Outpatient Medications  Medication Sig Dispense Refill   Accu-Chek  Softclix Lancets lancets Use as instructed to check blood sugar 4 times daily Dx E11.51 100 each 5   aspirin EC 81 MG tablet Take 81 mg by mouth at bedtime.     Blood Glucose Monitoring Suppl (ACCU-CHEK GUIDE ME) w/Device KIT Use to check blood sugar 4 times daily 1 kit 0   Continuous Blood Gluc Sensor (FREESTYLE LIBRE 2 SENSOR) MISC 1 Device by Does not apply route every 14 (fourteen) days. 6 each 3   diclofenac Sodium (VOLTAREN) 1 % GEL Apply 1 application. topically daily.     gabapentin (NEURONTIN) 300 MG capsule TAKE 1 CAPSULE BY MOUTH THREE TIMES A DAY 90 capsule 6   glucose blood (ACCU-CHEK GUIDE) test strip USE AS DIRECTED 4 TIMES  DAILY 350 strip 3   lisinopril-hydrochlorothiazide (ZESTORETIC) 20-25 MG tablet Take 1 tablet by mouth daily.     lovastatin (MEVACOR) 20 MG tablet Take 20 mg by mouth at bedtime.     metFORMIN (GLUCOPHAGE-XR) 500 MG 24 hr tablet Take 4 tablets (2,000 mg total) by mouth daily. 360 tablet 3   metoprolol succinate (TOPROL-XL) 100 MG 24 hr tablet Take 0.5 tablets (50 mg total) by mouth daily. 15 tablet 1   Netarsudil-Latanoprost (ROCKLATAN) 0.02-0.005 % SOLN Apply to eye.     pantoprazole (PROTONIX) 40 MG tablet Take 40 mg by mouth daily.     PROAIR HFA 108 (90 BASE) MCG/ACT inhaler Inhale 2 puffs into the lungs every 4 (four) hours as needed for wheezing or  shortness of breath. Reported on 11/04/2015     [place] INSULIN SYR 0.3ML/31G 31G X 5/16" 0.3 ML MISC USE 1 SYRINGE IN THE MORNING WITH  INSULIN 100 each 0   repaglinide (PRANDIN) 2 MG tablet TAKE 2 TABLETS BY MOUTH  TWICE DAILY BEFORE MEALS (Patient taking differently: Take 2 mg by mouth 2 (two) times daily before a meal.) 360 tablet 3   sertraline (ZOLOFT) 100 MG tablet Take 100 mg by mouth daily.     temazepam (RESTORIL) 30 MG capsule Take 30 mg by mouth at bedtime as needed for sleep.  0   timolol (TIMOPTIC) 0.5 % ophthalmic solution Place 1 drop into both eyes every morning.      Allergies as of 01/25/2022 - Review Complete 01/25/2022  Allergen Reaction Noted   Wound dressing adhesive Other (See Comments) and Rash 11/24/2015    Family History  Problem Relation Age of Onset   Heart disease Father    Heart attack Father        MI at age 55   Hypertension Mother    Alzheimer's disease Mother    Hypertension Sister    Diabetes Brother    Hyperlipidemia Brother    Hypertension Brother    Heart disease Brother    Heart attack Brother    Breast cancer Maternal Aunt     Social History   Socioeconomic History   Marital status: Single    Spouse name: Not on file   Number of children: 1   Years of education: Not on file   Highest education level: Not on file  Occupational History   Occupation: Retired    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  Tobacco Use   Smoking status: Former    Packs/day: 0.10    Types: Cigarettes    Quit date: 10/2014    Years since quitting: 7.2   Smokeless tobacco: Never   Tobacco comments:    pt states that she is using the E cig only   Vaping  Use   Vaping Use: Some days  Substance and Sexual Activity   Alcohol use: Yes    Alcohol/week: 0.0 - 1.0 standard drinks    Comment: Occasional; 10-22 rarely    Drug use: No   Sexual activity: Not on file  Other Topics Concern   Not on file  Social History Narrative   Drinks about 2 cups of coffee a day, and some tea     Social Determinants of Health   Financial Resource Strain: Not on file  Food Insecurity: Not on file  Transportation Needs: Not on file  Physical Activity: Not on file  Stress: Not on file  Social Connections: Not on file  Intimate Partner Violence: Not on file    Review of Systems: Positive for: GI: Described in detail in HPI.    Gen: Denies any fever, chills, rigors, night sweats, anorexia, fatigue, weakness, malaise, involuntary weight loss, and sleep disorder CV: claudication, denies chest pain, angina, palpitations, syncope, orthopnea, PND, peripheral edema. Resp: Denies dyspnea, cough, sputum, wheezing, coughing up blood. GU : Denies urinary burning, blood in urine, urinary frequency, urinary hesitancy, nocturnal urination, and urinary incontinence. MS: Denies joint pain or swelling.  Denies muscle weakness, cramps, atrophy.  Derm: Denies rash, itching, oral ulcerations, hives, unhealing ulcers.  Psych: Denies depression, anxiety, memory loss, suicidal ideation, hallucinations,  and confusion. Heme: Denies bruising, bleeding, and enlarged lymph nodes. Neuro:  Denies any headaches, dizziness, paresthesias. Endo:  DM, denies any problems with  thyroid, adrenal function.  Physical Exam: Vital signs in last 24 hours: Temp:  [98.4 F (36.9 C)-98.8 F (37.1 C)] 98.8 F (37.1 C) (03/08 3382) Pulse Rate:  [57-71] 60 (03/08 1100) Resp:  [11-21] 11 (03/08 1100) BP: (124-174)/(65-102) 124/65 (03/08 1100) SpO2:  [92 %-100 %] 92 % (03/08 1100) Weight:  [54 kg] 54 kg (03/08 0814)    General:   Ill-appearing Head:  Normocephalic and atraumatic. Eyes:  Sclera clear, no icterus.   Conjunctiva pink. Ears:  Normal auditory acuity. Nose:  No deformity, discharge,  or lesions. Mouth: Dry oral mucous membrane. Neck:  Supple; no masses or thyromegaly. Lungs:  Clear throughout to auscultation.   No wheezes, crackles, or rhonchi. No acute distress. Heart:  Regular rate and rhythm; no  murmurs, clicks, rubs,  or gallops. Extremities:  Without clubbing or edema. Neurologic:  Alert and  oriented x4;  grossly normal neurologically. Skin:  Intact without significant lesions or rashes. Psych:  Alert and cooperative. Normal mood and affect. Abdomen:  Soft, mild diffuse generalized tenderness and nondistended. No masses, hepatosplenomegaly or hernias noted. Normal bowel sounds, without guarding, and without rebound, well-healed surgical scars     Lab Results: Recent Labs    01/25/22 0042  WBC 8.6  HGB 12.8  HCT 40.1  PLT 352   BMET Recent Labs    01/25/22 0042  NA 136  K 3.6  CL 100  CO2 27  GLUCOSE 137*  BUN 11  CREATININE 0.75  CALCIUM 8.9   LFT Recent Labs    01/25/22 0042  PROT 6.3*  ALBUMIN 3.1*  AST 19  ALT 12  ALKPHOS 67  BILITOT 0.2*   PT/INR No results for input(s): LABPROT, INR in the last 72 hours.  Studies/Results: CT ABDOMEN PELVIS W CONTRAST  Result Date: 01/25/2022 CLINICAL DATA:  Upper abdominal pain with vomiting and diarrhea. EXAM: CT ABDOMEN AND PELVIS WITH CONTRAST TECHNIQUE: Multidetector CT imaging of the abdomen and pelvis was performed using the standard protocol following bolus  administration of intravenous contrast. RADIATION DOSE REDUCTION: This exam was performed according to the departmental dose-optimization program which includes automated exposure control, adjustment of the mA and/or kV according to patient size and/or use of iterative reconstruction technique. CONTRAST:  163m OMNIPAQUE IOHEXOL 300 MG/ML  SOLN COMPARISON:  October 11, 2020 FINDINGS: Lower chest: No acute abnormality. Hepatobiliary: No focal liver abnormality is seen. Status post cholecystectomy. No biliary dilatation. Pancreas: Evidence of prior Whipple procedure is seen with moderate to marked severity peripancreatic inflammatory fat stranding. Mild amount of peripancreatic fluid is also noted. The pancreatic duct is dilated and measures approximately 7 mm  in diameter (measured approximately 4 mm on the prior study). Spleen: Normal in size without focal abnormality. Adrenals/Urinary Tract: Adrenal glands are unremarkable. Kidneys are normal, without renal calculi, focal lesion, or hydronephrosis. Bladder is unremarkable. Stomach/Bowel: Stomach is within normal limits. The appendix is not visualized. No evidence of bowel dilatation. Vascular/Lymphatic: Marked severity calcification of the abdominal aorta and bilateral common iliac arteries. Marked severity calcification and atherosclerosis is seen along the origin and proximal portion of the right renal artery. No enlarged abdominal or pelvic lymph nodes. Reproductive: Uterus and bilateral adnexa are unremarkable. Other: No abdominal wall hernia or abnormality. No abdominopelvic ascites. Musculoskeletal: Degenerative changes are seen the lumbar spine, most prominent at the level of L5-S1. IMPRESSION: 1. Evidence of prior Whipple procedure with moderate to marked severity acute pancreatitis. 2. Evidence of prior cholecystectomy. 3. Aortic atherosclerosis. Aortic Atherosclerosis (ICD10-I70.0). Electronically Signed   By: TVirgina NorfolkM.D.   On: 01/25/2022 02:40    Impression: Acute on chronic pancreatitis Lipase 70 Moderate to marked severity of pancreatitis noted on CAT scan PD 7 mm (Previous work-up showed normal ANA, IgG subclass 4, normal triglycerides in 2020, no evidence of pancreatic divisum on prior imaging)  History of pancreatic intraepithelial neoplasia-low-grade requiring pylorus-preserving Whipple in 2020  Chronic diarrhea  Malnutrition, albumin 3.1, total protein 6.3  History of diabetes, on metformin  Plan: Will start on clear liquid diet  Continue IV fluid, currently on lactated Ringer's at 150 cc an hour, continue the same.  Pain management-currently on Dilaudid 1 mg IV every 4 hours as needed severe pain, has received oxycodone/acetaminophen 1 tablet x 1 and fentanyl 50 mcg IV  x 1, fentanyl 25 mcg IV x1, with fair control of pain.  Will start patient on pancreatic enzymes. Will recheck triglycerides. The cause of acute on chronic pancreatitis is unknown, however, patient continues to drink alcohol intermittently.  Normal renal function is reassuring. Will continue to trend BUN and hematocrit to assess adequate hydration.   LOS: 0 days   ARonnette Juniper MD  01/25/2022, 12:35 PM

## 2022-01-25 NOTE — Plan of Care (Signed)
  Problem: Education: Goal: Knowledge of General Education information will improve Description Including pain rating scale, medication(s)/side effects and non-pharmacologic comfort measures Outcome: Progressing   Problem: Clinical Measurements: Goal: Ability to maintain clinical measurements within normal limits will improve Outcome: Progressing   Problem: Activity: Goal: Risk for activity intolerance will decrease Outcome: Progressing   

## 2022-01-25 NOTE — ED Provider Triage Note (Signed)
Emergency Medicine Provider Triage Evaluation Note ? ?Jill Higgins , a 76 y.o. female  was evaluated in triage.  Pt complains of abdominal pain.  Reports that the pain began the evening of 3/6.  Pain has been constant since then and progressively worsening.  Pain is located to her epigastric area and radiates to left upper and right upper quadrant.  Patient tried taking Tylenol and Pepto-Bismol with no relief of symptoms.  Patient endorses nausea and vomiting.  Has had 2 episodes of vomiting last 24 hours.  Describes emesis as stomach contents.  Additionally patient reports diarrhea and black stool. ? ?Reports that she had a colonoscopy 2 days prior.  Denies any alcohol use or illicit drug use. ? ?Review of Systems  ?Positive: Abdominal pain, nausea, vomiting, diarrhea, black stool ?Negative: Fever, chills, abdominal distention, constipation, blood in stool, hematemesis, dysuria, hematuria, urinary urgency, urinary frequency, vaginal pain, vaginal bleeding, vaginal discharge ? ?Physical Exam  ?BP (!) 156/77 (BP Location: Right Arm)   Pulse 64   Temp 98.4 ?F (36.9 ?C) (Oral)   Resp 14   SpO2 99%  ?Gen:   Awake, no distress   ?Resp:  Normal effort  ?MSK:   Moves extremities without difficulty  ?Other:  Abdomen soft, nondistended, tenderness throughout upper quadrant with increased pain to epigastric area.  No guarding or rebound tenderness. ? ?Medical Decision Making  ?Medically screening exam initiated at 12:41 AM.  Appropriate orders placed.  Jill Higgins was informed that the remainder of the evaluation will be completed by another provider, this initial triage assessment does not replace that evaluation, and the importance of remaining in the ED until their evaluation is complete. ? ? ?  ?Loni Beckwith, PA-C ?01/25/22 7425 ? ?

## 2022-01-26 DIAGNOSIS — I10 Essential (primary) hypertension: Secondary | ICD-10-CM | POA: Diagnosis not present

## 2022-01-26 DIAGNOSIS — K8689 Other specified diseases of pancreas: Secondary | ICD-10-CM | POA: Diagnosis not present

## 2022-01-26 DIAGNOSIS — K859 Acute pancreatitis without necrosis or infection, unspecified: Secondary | ICD-10-CM | POA: Diagnosis not present

## 2022-01-26 DIAGNOSIS — E1159 Type 2 diabetes mellitus with other circulatory complications: Secondary | ICD-10-CM | POA: Diagnosis not present

## 2022-01-26 DIAGNOSIS — E7800 Pure hypercholesterolemia, unspecified: Secondary | ICD-10-CM | POA: Diagnosis not present

## 2022-01-26 LAB — CBC
HCT: 36.3 % (ref 36.0–46.0)
Hemoglobin: 12.4 g/dL (ref 12.0–15.0)
MCH: 32.5 pg (ref 26.0–34.0)
MCHC: 34.2 g/dL (ref 30.0–36.0)
MCV: 95 fL (ref 80.0–100.0)
Platelets: 352 10*3/uL (ref 150–400)
RBC: 3.82 MIL/uL — ABNORMAL LOW (ref 3.87–5.11)
RDW: 14 % (ref 11.5–15.5)
WBC: 6.4 10*3/uL (ref 4.0–10.5)
nRBC: 0 % (ref 0.0–0.2)

## 2022-01-26 LAB — COMPREHENSIVE METABOLIC PANEL
ALT: 11 U/L (ref 0–44)
AST: 18 U/L (ref 15–41)
Albumin: 2.6 g/dL — ABNORMAL LOW (ref 3.5–5.0)
Alkaline Phosphatase: 59 U/L (ref 38–126)
Anion gap: 8 (ref 5–15)
BUN: 7 mg/dL — ABNORMAL LOW (ref 8–23)
CO2: 30 mmol/L (ref 22–32)
Calcium: 8.7 mg/dL — ABNORMAL LOW (ref 8.9–10.3)
Chloride: 103 mmol/L (ref 98–111)
Creatinine, Ser: 0.7 mg/dL (ref 0.44–1.00)
GFR, Estimated: >60 mL/min (ref >60)
Glucose, Bld: 81 mg/dL (ref 70–99)
Potassium: 3.1 mmol/L — ABNORMAL LOW (ref 3.5–5.1)
Sodium: 141 mmol/L (ref 135–145)
Total Bilirubin: 0.2 mg/dL — ABNORMAL LOW (ref 0.3–1.2)
Total Protein: 5.9 g/dL — ABNORMAL LOW (ref 6.5–8.1)

## 2022-01-26 LAB — GLUCOSE, CAPILLARY
Glucose-Capillary: 111 mg/dL — ABNORMAL HIGH (ref 70–99)
Glucose-Capillary: 128 mg/dL — ABNORMAL HIGH (ref 70–99)
Glucose-Capillary: 191 mg/dL — ABNORMAL HIGH (ref 70–99)
Glucose-Capillary: 78 mg/dL (ref 70–99)
Glucose-Capillary: 89 mg/dL (ref 70–99)
Glucose-Capillary: 96 mg/dL (ref 70–99)

## 2022-01-26 MED ORDER — PROSOURCE PLUS PO LIQD
30.0000 mL | Freq: Two times a day (BID) | ORAL | Status: DC
  Administered 2022-01-27 – 2022-01-31 (×9): 30 mL via ORAL
  Filled 2022-01-26 (×9): qty 30

## 2022-01-26 MED ORDER — ADULT MULTIVITAMIN W/MINERALS CH
1.0000 | ORAL_TABLET | Freq: Every day | ORAL | Status: DC
  Administered 2022-01-26 – 2022-01-31 (×6): 1 via ORAL
  Filled 2022-01-26 (×6): qty 1

## 2022-01-26 MED ORDER — PHENYLEPHRINE HCL-NACL 20-0.9 MG/250ML-% IV SOLN
INTRAVENOUS | Status: AC
  Filled 2022-01-26: qty 750

## 2022-01-26 MED ORDER — POLYETHYLENE GLYCOL 3350 17 G PO PACK
17.0000 g | PACK | Freq: Every day | ORAL | Status: DC
  Administered 2022-01-26: 10:00:00 17 g via ORAL
  Filled 2022-01-26: qty 1

## 2022-01-26 MED ORDER — PROPOFOL 1000 MG/100ML IV EMUL
INTRAVENOUS | Status: AC
  Filled 2022-01-26: qty 300

## 2022-01-26 MED ORDER — OXYCODONE HCL 5 MG PO TABS
5.0000 mg | ORAL_TABLET | Freq: Four times a day (QID) | ORAL | Status: DC | PRN
  Administered 2022-01-26 – 2022-01-28 (×5): 5 mg via ORAL
  Filled 2022-01-26 (×5): qty 1

## 2022-01-26 MED ORDER — POTASSIUM CHLORIDE CRYS ER 20 MEQ PO TBCR
40.0000 meq | EXTENDED_RELEASE_TABLET | Freq: Once | ORAL | Status: AC
  Administered 2022-01-26: 10:00:00 40 meq via ORAL
  Filled 2022-01-26: qty 2

## 2022-01-26 NOTE — Progress Notes (Signed)
Initial Nutrition Assessment ? ?DOCUMENTATION CODES:  ?Not applicable ? ?INTERVENTION:  ?-Boost Breeze po TID, each supplement provides 250 kcal and 9 grams of protein ?-PROSource PLUS PO 39ms BID, each supplement provides 100 kcals and 15 grams of protein ?-MVI with minerals daily ? ?NUTRITION DIAGNOSIS:  ?Inadequate oral intake related to acute illness (pancreatitis) as evidenced by estimated needs. ? ?GOAL:  ?Patient will meet greater than or equal to 90% of their needs ? ?MONITOR:  ?PO intake, Supplement acceptance, Diet advancement, Labs, I & O's ? ?REASON FOR ASSESSMENT:  ?Malnutrition Screening Tool ?  ? ?ASSESSMENT:  ?Pt with PMH significant for HTN, HLD, PVD, GERD, DM, and depression/anxiety admitted with acute on chronic pancreatitis. ? ?Pt unavailable at time of RD visit. Unable to obtain diet/weight history at this time; will attempt to obtain at follow-up. No PO intake documented. Weight history reviewed. No significant weight changes noted. Will order oral nutrition supplements and monitor for diet advancement. Per RN, pt tolerating clears well. ? ?UOP: [id number] hours ?I/O: +[id number] admit ? ?Medications: SSI Q4H, creon TID ?IVF: LR @ 150m90m  ?Labs: ?Recent Labs  ?Lab 01/25/22 ?[id number]/23 ?0124  ?NA 136 141  ?K 3.6 3.1*  ?CL 100 103  ?CO2 27 30  ?BUN 11 7*  ?CREATININE 0.75 0.70  ?CALCIUM 8.9 8.7*  ?GLUCOSE 137* 81  ?CBGs: 78-191 x24 hours ?  ?NUTRITION - FOCUSED PHYSICAL EXAM: ?Unable to perform at this time. Will attempt at follow-up.  ? ?Diet Order:   ?Diet Order   ? ?       ?  Diet clear liquid Room service appropriate? Yes; Fluid consistency: Thin  Diet effective now       ?  ? ?  ?  ? ?  ? ?EDUCATION NEEDS:  ?No education needs have been identified at this time ? ?Skin:  Skin Assessment: Reviewed RN Assessment ? ?Last BM:  PTA ? ?Height:  ?Ht Readings from Last 1 Encounters:  ?01/25/22 '5\' 3"'$  (1.6 m)  ? ?Weight:  ?Wt Readings from Last 1 Encounters:  ?01/25/22 54 kg  ? ?BMI:  Body mass  index is 21.08 kg/m?. ? ?Estimated Nutritional Needs:  ?Kcal:  1350-1550 ?Protein:  65-80 grams ?Fluid:  >1.35L ? ? ? ? ?AmanTheone StanleyS, RD, LDN (she/her/hers) ?RD pager number and weekend/on-call pager number located in [place]

## 2022-01-26 NOTE — Progress Notes (Addendum)
PROGRESS NOTE        PATIENT DETAILS Name: Jill Higgins Age: 76 y.o. Sex: female Date of Birth: 1946-05-18 Admit Date: 01/25/2022 Admitting Physician Evalee Mutton Kristeen Mans, MD SMO:LMBEM, Hal Hope, MD  Brief Summary: Patient is a 76 y.o.  female with pancreatitis of unknown etiology ( (s/p Whipple's procedure in 10/2019-as a FNA on 08/2019 was suspicious for tumor-however pathology from Whipple's procedure showed multifocal pancreatitis)-presented to the hospital with abdominal pain-was found to have acute on chronic pancreatitis.  Significant Hospital events: 3/8>> presented with abdominal pain-found to have acute on chronic pancreatitis.  Significant imaging studies: 3/8>> CT abdomen: Moderate to marked severity acute pancreatitis.  Prior cholecystectomy.  Significant microbiology data: 3/8>> COVID/influenza PCR: Negative  Procedures: None  Consults:  Gastroenterology  Subjective: Appears much more comfortable than yesterday-abdominal pain has improved-tolerating clears.  Objective: Vitals: Blood pressure (!) 143/68, pulse (!) 56, temperature (!) 97.5 F (36.4 C), temperature source Oral, resp. rate 17, height '5\' 3"'$  (1.6 m), weight 54 kg, SpO2 91 %.   Exam: Gen Exam:Alert awake-not in any distress HEENT:atraumatic, normocephalic Chest: B/L clear to auscultation anteriorly CVS:S1S2 regular Abdomen: Continues to have some tenderness in the upper abdomen-although better than yesterday-no peritoneal signs. Extremities:no edema Neurology: Non focal Skin: no rash  Pertinent Labs/Radiology: CBC Latest Ref Rng & Units 01/26/2022 01/25/2022 09/04/2019  WBC 4.0 - 10.5 K/uL 6.4 8.6 5.7  Hemoglobin 12.0 - 15.0 g/dL 12.4 12.8 11.6(L)  Hematocrit 36.0 - 46.0 % 36.3 40.1 36.0  Platelets 150 - 400 K/uL 352 352 285    Lab Results  Component Value Date   NA 141 01/26/2022   K 3.1 (L) 01/26/2022   CL 103 01/26/2022   CO2 30 01/26/2022       Assessment/Plan: Acute on chronic pancreatitis: Unknown etiology-has had extensive work-up in the past-does have some intermittent alcohol consumption (does not binge-a few glasses of wine or pia colada several times a week).  Overnight has improved with supportive care-advance to full liquids-add oral narcotics.  Continue to follow clinically.  Appreciate GI input.  Hypokalemia: Due to GI loss/vomiting.  Replete and recheck.  HTN: BP stable-continue amlodipine/metoprolol.  History of PAD-s/p femoral endarterectomy/right femoropopliteal bypass 2014-and angioplasty in 2017: Stable for outpatient follow-up with vascular surgery-on aspirin.  DM-2: CBG stable with SSI.  Resume oral hypoglycemic agents on discharge.  Recent Labs    01/26/22 0411 01/26/22 0750 01/26/22 1148  GLUCAP 96 111* 191*   BMI Estimated body mass index is 21.08 kg/m as calculated from the following:   Height as of this encounter: '5\' 3"'$  (1.6 m).   Weight as of this encounter: 54 kg.   Code status:   Code Status: Full Code   DVT Prophylaxis: enoxaparin (LOVENOX) injection 40 mg Start: 01/25/22 1100   Family Communication: None at bedside   Disposition Plan: Status is: Inpatient Remains inpatient appropriate because: Resolving pancreatitis-not yet stable for discharge.   Planned Discharge Destination:Home   Diet: Diet Order             Diet full liquid Room service appropriate? Yes; Fluid consistency: Thin  Diet effective now                     Antimicrobial agents: Anti-infectives (From admission, onward)    None        MEDICATIONS: Scheduled Meds:  amLODipine  5 mg Oral Daily   aspirin EC  81 mg Oral QHS   enoxaparin (LOVENOX) injection  40 mg Subcutaneous Q24H   feeding supplement  1 Container Oral TID BM   insulin aspart  0-9 Units Subcutaneous Q4H   lipase/protease/amylase  24,000 Units Oral TID AC   metoprolol succinate  50 mg Oral Daily   polyethylene glycol  17 g  Oral Daily   Continuous Infusions:  lactated ringers 150 mL/hr at 01/26/22 0720   PRN Meds:.acetaminophen **OR** acetaminophen, hydrALAZINE, HYDROmorphone (DILAUDID) injection, ondansetron **OR** ondansetron (ZOFRAN) IV, oxyCODONE   I have personally reviewed following labs and imaging studies  LABORATORY DATA: CBC: Recent Labs  Lab 01/25/22 0042 01/26/22 0124  WBC 8.6 6.4  NEUTROABS 6.3  --   HGB 12.8 12.4  HCT 40.1 36.3  MCV 95.9 95.0  PLT 352 710    Basic Metabolic Panel: Recent Labs  Lab 01/25/22 0042 01/26/22 0124  NA 136 141  K 3.6 3.1*  CL 100 103  CO2 27 30  GLUCOSE 137* 81  BUN 11 7*  CREATININE 0.75 0.70  CALCIUM 8.9 8.7*    GFR: Estimated Creatinine Clearance: 49.5 mL/min (by C-G formula based on SCr of 0.7 mg/dL).  Liver Function Tests: Recent Labs  Lab 01/25/22 0042 01/26/22 0124  AST 19 18  ALT 12 11  ALKPHOS 67 59  BILITOT 0.2* 0.2*  PROT 6.3* 5.9*  ALBUMIN 3.1* 2.6*   Recent Labs  Lab 01/25/22 0042  LIPASE 70*   No results for input(s): AMMONIA in the last 168 hours.  Coagulation Profile: No results for input(s): INR, PROTIME in the last 168 hours.  Cardiac Enzymes: No results for input(s): CKTOTAL, CKMB, CKMBINDEX, TROPONINI in the last 168 hours.  BNP (last 3 results) No results for input(s): PROBNP in the last 8760 hours.  Lipid Profile: Recent Labs    01/25/22 1430  TRIG 75    Thyroid Function Tests: No results for input(s): TSH, T4TOTAL, FREET4, T3FREE, THYROIDAB in the last 72 hours.  Anemia Panel: No results for input(s): VITAMINB12, FOLATE, FERRITIN, TIBC, IRON, RETICCTPCT in the last 72 hours.  Urine analysis:    Component Value Date/Time   COLORURINE YELLOW 01/25/2022 1224   APPEARANCEUR CLEAR 01/25/2022 1224   LABSPEC >1.046 (H) 01/25/2022 1224   PHURINE 7.0 01/25/2022 1224   GLUCOSEU NEGATIVE 01/25/2022 1224   HGBUR NEGATIVE 01/25/2022 1224   BILIRUBINUR NEGATIVE 01/25/2022 1224   KETONESUR 80 (A)  01/25/2022 1224   PROTEINUR NEGATIVE 01/25/2022 1224   UROBILINOGEN 0.2 09/29/2013 1359   NITRITE NEGATIVE 01/25/2022 1224   LEUKOCYTESUR NEGATIVE 01/25/2022 1224    Sepsis Labs: Lactic Acid, Venous    Component Value Date/Time   LATICACIDVEN 1.0 08/22/2019 2216    MICROBIOLOGY: Recent Results (from the past 240 hour(s))  Resp Panel by RT-PCR (Flu A&B, Covid) Nasopharyngeal Swab     Status: None   Collection Time: 01/25/22 10:24 AM   Specimen: Nasopharyngeal Swab; Nasopharyngeal(NP) swabs in vial transport medium  Result Value Ref Range Status   SARS Coronavirus 2 by RT PCR NEGATIVE NEGATIVE Final    Comment: (NOTE) SARS-CoV-2 target nucleic acids are NOT DETECTED.  The SARS-CoV-2 RNA is generally detectable in upper respiratory specimens during the acute phase of infection. The lowest concentration of SARS-CoV-2 viral copies this assay can detect is 138 copies/mL. A negative result does not preclude SARS-Cov-2 infection and should not be used as the sole basis for treatment or other patient management  decisions. A negative result may occur with  improper specimen collection/handling, submission of specimen other than nasopharyngeal swab, presence of viral mutation(s) within the areas targeted by this assay, and inadequate number of viral copies(<138 copies/mL). A negative result must be combined with clinical observations, patient history, and epidemiological information. The expected result is Negative.  Fact Sheet for Patients:  EntrepreneurPulse.com.au  Fact Sheet for Healthcare Providers:  IncredibleEmployment.be  This test is no t yet approved or cleared by the [place] FDA and  has been authorized for detection and/or diagnosis of SARS-CoV-2 by FDA under an Emergency Use Authorization (EUA). This EUA will remain  in effect (meaning this test can be used) for the duration of the COVID-19 declaration under Section 564(b)(1)  of the Act, 21 U.S.C.section 360bbb-3(b)(1), unless the authorization is terminated  or revoked sooner.       Influenza A by PCR NEGATIVE NEGATIVE Final   Influenza B by PCR NEGATIVE NEGATIVE Final    Comment: (NOTE) The Xpert Xpress SARS-CoV-2/FLU/RSV plus assay is intended as an aid in the diagnosis of influenza from Nasopharyngeal swab specimens and should not be used as a sole basis for treatment. Nasal washings and aspirates are unacceptable for Xpert Xpress SARS-CoV-2/FLU/RSV testing.  Fact Sheet for Patients: EntrepreneurPulse.com.au  Fact Sheet for Healthcare Providers: IncredibleEmployment.be  This test is not yet approved or cleared by the [place] FDA and has been authorized for detection and/or diagnosis of SARS-CoV-2 by FDA under an Emergency Use Authorization (EUA). This EUA will remain in effect (meaning this test can be used) for the duration of the COVID-19 declaration under Section 564(b)(1) of the Act, 21 U.S.C. section 360bbb-3(b)(1), unless the authorization is terminated or revoked.  Performed at [facility], [place] [address], [place], [place] [address]     RADIOLOGY STUDIES/RESULTS: CT ABDOMEN PELVIS W CONTRAST  Result Date: 01/25/2022 CLINICAL DATA:  Upper abdominal pain with vomiting and diarrhea. EXAM: CT ABDOMEN AND PELVIS WITH CONTRAST TECHNIQUE: Multidetector CT imaging of the abdomen and pelvis was performed using the standard protocol following bolus administration of intravenous contrast. RADIATION DOSE REDUCTION: This exam was performed according to the departmental dose-optimization program which includes automated exposure control, adjustment of the mA and/or kV according to patient size and/or use of iterative reconstruction technique. CONTRAST:  169m OMNIPAQUE IOHEXOL 300 MG/ML  SOLN COMPARISON:  October 11, 2020 FINDINGS: Lower chest: No acute abnormality. Hepatobiliary: No focal liver abnormality  is seen. Status post cholecystectomy. No biliary dilatation. Pancreas: Evidence of prior Whipple procedure is seen with moderate to marked severity peripancreatic inflammatory fat stranding. Mild amount of peripancreatic fluid is also noted. The pancreatic duct is dilated and measures approximately 7 mm in diameter (measured approximately 4 mm on the prior study). Spleen: Normal in size without focal abnormality. Adrenals/Urinary Tract: Adrenal glands are unremarkable. Kidneys are normal, without renal calculi, focal lesion, or hydronephrosis. Bladder is unremarkable. Stomach/Bowel: Stomach is within normal limits. The appendix is not visualized. No evidence of bowel dilatation. Vascular/Lymphatic: Marked severity calcification of the abdominal aorta and bilateral common iliac arteries. Marked severity calcification and atherosclerosis is seen along the origin and proximal portion of the right renal artery. No enlarged abdominal or pelvic lymph nodes. Reproductive: Uterus and bilateral adnexa are unremarkable. Other: No abdominal wall hernia or abnormality. No abdominopelvic ascites. Musculoskeletal: Degenerative changes are seen the lumbar spine, most prominent at the level of L5-S1. IMPRESSION: 1. Evidence of prior Whipple procedure with moderate to marked severity acute pancreatitis. 2.  Evidence of prior cholecystectomy. 3. Aortic atherosclerosis. Aortic Atherosclerosis (ICD10-I70.0). Electronically Signed   By: Virgina Norfolk M.D.   On: 01/25/2022 02:40     LOS: 1 day   Oren Binet, MD  [facility]    To contact the attending provider between 7A-7P or the covering provider during after hours 7P-7A, please log into the web site [email or web address] and access using universal [place] password for that web site. If you do not have the password, please call the hospital operator.  01/26/2022, 11:50 AM

## 2022-01-26 NOTE — Progress Notes (Signed)
[facility] Progress Note ? ?Jill Higgins 76 y.o. [birth year] ? ?CC:  Acute on chronic pancreatitis ? ? ?Subjective: ?Patient states that she is doing the same as yesterday.  Still having intermittent epigastric pain that wraps around her right side to her back.  Tolerating boost shakes and clear liquids without difficulty.  Denies nausea/vomiting.  Denies melena/hematochezia. ? ?ROS : Review of Systems  ?Constitutional:  Negative for chills and fever.  ?Gastrointestinal:  Positive for abdominal pain. Negative for blood in stool, constipation, diarrhea, heartburn, melena, nausea and vomiting.   ? ? ?Objective: ?Vital signs in last 24 hours: ?Vitals:  ? 01/26/22 0633 01/26/22 0840  ?BP:  (!) 143/68  ?Pulse:  (!) 56  ?Resp: 11 17  ?Temp:  (!) 97.5 ?F (36.4 ?C)  ?SpO2:  91%  ? ? ?Physical Exam: ? ?General:  Alert, cooperative, no distress, appears stated age  ?Head:  Normocephalic, without obvious abnormality, atraumatic  ?Eyes:  Anicteric sclera, EOM's intact  ?Lungs:   Clear to auscultation bilaterally, respirations unlabored  ?Heart:  Regular rate and rhythm, S1, S2 normal  ?Abdomen:   Soft, mild tenderness in upper abdomen, bowel sounds active all four quadrants,  no masses,   ? ? ?Lab Results: ?Recent Labs  ?  01/25/22 ?1610 01/26/22 ?0124  ?NA 136 141  ?K 3.6 3.1*  ?CL 100 103  ?CO2 27 30  ?GLUCOSE 137* 81  ?BUN 11 7*  ?CREATININE 0.75 0.70  ?CALCIUM 8.9 8.7*  ? ?Recent Labs  ?  01/25/22 ?9604 01/26/22 ?0124  ?AST 19 18  ?ALT 12 11  ?ALKPHOS 67 59  ?BILITOT 0.2* 0.2*  ?PROT 6.3* 5.9*  ?ALBUMIN 3.1* 2.6*  ? ?Recent Labs  ?  01/25/22 ?5409 01/26/22 ?0124  ?WBC 8.6 6.4  ?NEUTROABS 6.3  --   ?HGB 12.8 12.4  ?HCT 40.1 36.3  ?MCV 95.9 95.0  ?PLT 352 352  ? ?No results for input(s): LABPROT, INR in the last 72 hours. ? ? ? ?Assessment ?Acute on chronic pancreatitis ?- lipase 70 ?-CT abdomen pelvis: Moderate to marked severity of pancreatitis ?-Normal ANA, IgG subclass 4 ?-Triglycerides 75 3/8 ?-Normal renal  function (BUN 7, creatinine 0.70) ? 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  ?Chronic diarrhea ?  ?History of diabetes, on metformin ? ?Plan: ?Triglycerides, ANA, IgG subclass 4 level limits.  Etiology of acute on chronic pancreatitis is unknown, though patient continues to drink alcohol intermittently ?Continue clear liquid diet as tolerated ?Continue lactated Ringer's 150 cc an hour ?Continue pain management.  Dilaudid 1 Mg IV every 4 hours as needed, oxycodone 5 mg every 6 hours as needed ?Continue pancreatic enzymes ?[facility] will follow ? ?Lamont Tant Radford Pax PA-C ?01/26/2022, 9:53 AM ? ?Contact #  [phone]  ?

## 2022-01-27 DIAGNOSIS — K861 Other chronic pancreatitis: Secondary | ICD-10-CM | POA: Diagnosis not present

## 2022-01-27 DIAGNOSIS — K859 Acute pancreatitis without necrosis or infection, unspecified: Secondary | ICD-10-CM | POA: Diagnosis not present

## 2022-01-27 LAB — BASIC METABOLIC PANEL
Anion gap: 11 (ref 5–15)
BUN: <5 mg/dL — ABNORMAL LOW (ref 8–23)
CO2: 25 mmol/L (ref 22–32)
Calcium: 8.8 mg/dL — ABNORMAL LOW (ref 8.9–10.3)
Chloride: 105 mmol/L (ref 98–111)
Creatinine, Ser: 0.68 mg/dL (ref 0.44–1.00)
GFR, Estimated: >60 mL/min (ref >60)
Glucose, Bld: 83 mg/dL (ref 70–99)
Potassium: 3.7 mmol/L (ref 3.5–5.1)
Sodium: 141 mmol/L (ref 135–145)

## 2022-01-27 LAB — GLUCOSE, CAPILLARY
Glucose-Capillary: 226 mg/dL — ABNORMAL HIGH (ref 70–99)
Glucose-Capillary: 79 mg/dL (ref 70–99)
Glucose-Capillary: 117 mg/dL — ABNORMAL HIGH (ref 70–99)
Glucose-Capillary: 78 mg/dL (ref 70–99)
Glucose-Capillary: 208 mg/dL — ABNORMAL HIGH (ref 70–99)
Glucose-Capillary: 151 mg/dL — ABNORMAL HIGH (ref 70–99)
Glucose-Capillary: 254 mg/dL — ABNORMAL HIGH (ref 70–99)

## 2022-01-27 LAB — MAGNESIUM: Magnesium: 1.4 mg/dL — ABNORMAL LOW (ref 1.7–2.4)

## 2022-01-27 MED ORDER — PANTOPRAZOLE SODIUM 40 MG IV SOLR
40.0000 mg | INTRAVENOUS | Status: DC
  Administered 2022-01-27 – 2022-01-31 (×5): 40 mg via INTRAVENOUS
  Filled 2022-01-27 (×5): qty 10

## 2022-01-27 MED ORDER — MAGNESIUM SULFATE 4 GM/100ML IV SOLN
4.0000 g | Freq: Once | INTRAVENOUS | Status: AC
Start: 2022-01-27 — End: 2022-01-27
  Administered 2022-01-27: 4 g via INTRAVENOUS
  Filled 2022-01-27: qty 100

## 2022-01-27 NOTE — Progress Notes (Signed)
PROGRESS NOTE        PATIENT DETAILS Name: Jill Higgins Age: 76 y.o. Sex: female Date of Birth: 26-May-1946 Admit Date: 01/25/2022 Admitting Physician Evalee Mutton Kristeen Mans, MD BOF:BPZWC, Hal Hope, MD  Brief Summary: Patient is a 76 y.o.  female with pancreatitis of unknown etiology ( (s/p Whipple's procedure in 10/2019-as a FNA on 08/2019 was suspicious for tumor-however pathology from Whipple's procedure showed multifocal pancreatitis)-presented to the hospital with abdominal pain-was found to have acute on chronic pancreatitis.  Significant Hospital events: 3/8>> presented with abdominal pain-found to have acute on chronic pancreatitis.  Significant imaging studies: 3/8>> CT abdomen: Moderate to marked severity acute pancreatitis.  Prior cholecystectomy.  Significant microbiology data: 3/8>> COVID/influenza PCR: Negative  Procedures: None  Consults:  Gastroenterology  Subjective: Had multiple episodes of abdominal pain overnight.  No vomiting but feels nauseous.  Did not tolerate advancement to full liquids yesterday-switch back to clear liquids.  Objective: Vitals: Blood pressure 120/68, pulse 62, temperature 98 F (36.7 C), temperature source Oral, resp. rate 18, height '5\' 3"'$  (1.6 m), weight 54 kg, SpO2 100 %.   Exam: Gen Exam:Alert awake-not in any distress HEENT:atraumatic, normocephalic Chest: B/L clear to auscultation anteriorly CVS:S1S2 regular Abdomen: Tender in the epigastric area without any peritoneal signs. Extremities:no edema Neurology: Non focal Skin: no rash   Pertinent Labs/Radiology: CBC Latest Ref Rng & Units 01/26/2022 01/25/2022 09/04/2019  WBC 4.0 - 10.5 K/uL 6.4 8.6 5.7  Hemoglobin 12.0 - 15.0 g/dL 12.4 12.8 11.6(L)  Hematocrit 36.0 - 46.0 % 36.3 40.1 36.0  Platelets 150 - 400 K/uL 352 352 285    Lab Results  Component Value Date   NA 141 01/27/2022   K 3.7 01/27/2022   CL 105 01/27/2022   CO2 25 01/27/2022       Assessment/Plan: Acute on chronic pancreatitis: Unknown etiology-has had extensive work-up in the past-does have some intermittent alcohol consumption (does not binge-a few glasses of wine or pia colada several times a week).  Unfortunately-unable to tolerate advancement in diet yesterday-switch back to clear liquids-continues to have severe episodes of pain requiring IV narcotics.  Suspect needs to stay on clear liquid diet for a few days-continue IVF and other supportive care.  Appreciate GI input.    Hypokalemia: Due to GI loss/vomiting.  Repleted.  HTN: BP stable-continue amlodipine/metoprolol.  History of PAD-s/p femoral endarterectomy/right femoropopliteal bypass 2014-and angioplasty in 2017: Stable for outpatient follow-up with vascular surgery-on aspirin.  DM-2: CBG stable with SSI.  Resume oral hypoglycemic agents on discharge.  Recent Labs    01/27/22 0439 01/27/22 0526 01/27/22 1146  GLUCAP 78 117* 208*    BMI Estimated body mass index is 21.08 kg/m as calculated from the following:   Height as of this encounter: '5\' 3"'$  (1.6 m).   Weight as of this encounter: 54 kg.   Code status:   Code Status: Full Code   DVT Prophylaxis: enoxaparin (LOVENOX) injection 40 mg Start: 01/25/22 1100   Family Communication: Daughter-Vickie Publishing rights manager on 3/10   Disposition Plan: Status is: Inpatient Remains inpatient appropriate because: Resolving pancreatitis-not yet stable for discharge.   Planned Discharge Destination:Home   Diet: Diet Order             Diet clear liquid Room service appropriate? Yes; Fluid consistency: Thin  Diet effective now  Antimicrobial agents: Anti-infectives (From admission, onward)    None        MEDICATIONS: Scheduled Meds:  (feeding supplement) PROSource Plus  30 mL Oral BID BM   amLODipine  5 mg Oral Daily   aspirin EC  81 mg Oral QHS   enoxaparin (LOVENOX) injection  40 mg  Subcutaneous Q24H   feeding supplement  1 Container Oral TID BM   insulin aspart  0-9 Units Subcutaneous Q4H   lipase/protease/amylase  24,000 Units Oral TID AC   metoprolol succinate  50 mg Oral Daily   multivitamin with minerals  1 tablet Oral Daily   pantoprazole (PROTONIX) IV  40 mg Intravenous Q24H   Continuous Infusions:  lactated ringers 150 mL/hr at 01/27/22 0220   magnesium sulfate bolus IVPB     PRN Meds:.acetaminophen **OR** acetaminophen, hydrALAZINE, HYDROmorphone (DILAUDID) injection, ondansetron **OR** ondansetron (ZOFRAN) IV, oxyCODONE   I have personally reviewed following labs and imaging studies  LABORATORY DATA: CBC: Recent Labs  Lab 01/25/22 0042 01/26/22 0124  WBC 8.6 6.4  NEUTROABS 6.3  --   HGB 12.8 12.4  HCT 40.1 36.3  MCV 95.9 95.0  PLT 352 352     Basic Metabolic Panel: Recent Labs  Lab 01/25/22 0042 01/26/22 0124 01/27/22 0359  NA 136 141 141  K 3.6 3.1* 3.7  CL 100 103 105  CO2 '27 30 25  '$ GLUCOSE 137* 81 83  BUN 11 7* <5*  CREATININE 0.75 0.70 0.68  CALCIUM 8.9 8.7* 8.8*  MG  --   --  1.4*     GFR: Estimated Creatinine Clearance: 49.5 mL/min (by C-G formula based on SCr of 0.68 mg/dL).  Liver Function Tests: Recent Labs  Lab 01/25/22 0042 01/26/22 0124  AST 19 18  ALT 12 11  ALKPHOS 67 59  BILITOT 0.2* 0.2*  PROT 6.3* 5.9*  ALBUMIN 3.1* 2.6*    Recent Labs  Lab 01/25/22 0042  LIPASE 70*    No results for input(s): AMMONIA in the last 168 hours.  Coagulation Profile: No results for input(s): INR, PROTIME in the last 168 hours.  Cardiac Enzymes: No results for input(s): CKTOTAL, CKMB, CKMBINDEX, TROPONINI in the last 168 hours.  BNP (last 3 results) No results for input(s): PROBNP in the last 8760 hours.  Lipid Profile: Recent Labs    01/25/22 1430  TRIG 75     Thyroid Function Tests: No results for input(s): TSH, T4TOTAL, FREET4, T3FREE, THYROIDAB in the last 72 hours.  Anemia Panel: No results  for input(s): VITAMINB12, FOLATE, FERRITIN, TIBC, IRON, RETICCTPCT in the last 72 hours.  Urine analysis:    Component Value Date/Time   COLORURINE YELLOW 01/25/2022 1224   APPEARANCEUR CLEAR 01/25/2022 1224   LABSPEC >1.046 (H) 01/25/2022 1224   PHURINE 7.0 01/25/2022 1224   GLUCOSEU NEGATIVE 01/25/2022 1224   HGBUR NEGATIVE 01/25/2022 1224   BILIRUBINUR NEGATIVE 01/25/2022 1224   KETONESUR 80 (A) 01/25/2022 1224   PROTEINUR NEGATIVE 01/25/2022 1224   UROBILINOGEN 0.2 09/29/2013 1359   NITRITE NEGATIVE 01/25/2022 1224   LEUKOCYTESUR NEGATIVE 01/25/2022 1224    Sepsis Labs: Lactic Acid, Venous    Component Value Date/Time   LATICACIDVEN 1.0 08/22/2019 2216    MICROBIOLOGY: Recent Results (from the past 240 hour(s))  Resp Panel by RT-PCR (Flu A&B, Covid) Nasopharyngeal Swab     Status: None   Collection Time: 01/25/22 10:24 AM   Specimen: Nasopharyngeal Swab; Nasopharyngeal(NP) swabs in vial transport medium  Result Value Ref Range Status  SARS Coronavirus 2 by RT PCR NEGATIVE NEGATIVE Final    Comment: (NOTE) SARS-CoV-2 target nucleic acids are NOT DETECTED.  The SARS-CoV-2 RNA is generally detectable in upper respiratory specimens during the acute phase of infection. The lowest concentration of SARS-CoV-2 viral copies this assay can detect is 138 copies/mL. A negative result does not preclude SARS-Cov-2 infection and should not be used as the sole basis for treatment or other patient management decisions. A negative result may occur with  improper specimen collection/handling, submission of specimen other than nasopharyngeal swab, presence of viral mutation(s) within the areas targeted by this assay, and inadequate number of viral copies(<138 copies/mL). A negative result must be combined with clinical observations, patient history, and epidemiological information. The expected result is Negative.  Fact Sheet for Patients:   EntrepreneurPulse.com.au  Fact Sheet for Healthcare Providers:  IncredibleEmployment.be  This test is no t yet approved or cleared by the [place] FDA and  has been authorized for detection and/or diagnosis of SARS-CoV-2 by FDA under an Emergency Use Authorization (EUA). This EUA will remain  in effect (meaning this test can be used) for the duration of the COVID-19 declaration under Section 564(b)(1) of the Act, 21 U.S.C.section 360bbb-3(b)(1), unless the authorization is terminated  or revoked sooner.       Influenza A by PCR NEGATIVE NEGATIVE Final   Influenza B by PCR NEGATIVE NEGATIVE Final    Comment: (NOTE) The Xpert Xpress SARS-CoV-2/FLU/RSV plus assay is intended as an aid in the diagnosis of influenza from Nasopharyngeal swab specimens and should not be used as a sole basis for treatment. Nasal washings and aspirates are unacceptable for Xpert Xpress SARS-CoV-2/FLU/RSV testing.  Fact Sheet for Patients: EntrepreneurPulse.com.au  Fact Sheet for Healthcare Providers: IncredibleEmployment.be  This test is not yet approved or cleared by the [place] FDA and has been authorized for detection and/or diagnosis of SARS-CoV-2 by FDA under an Emergency Use Authorization (EUA). This EUA will remain in effect (meaning this test can be used) for the duration of the COVID-19 declaration under Section 564(b)(1) of the Act, 21 U.S.C. section 360bbb-3(b)(1), unless the authorization is terminated or revoked.  Performed at [facility], [place] [address], [place], [place] [address]     RADIOLOGY STUDIES/RESULTS: No results found.   LOS: 2 days   Oren Binet, MD  [facility]    To contact the attending provider between 7A-7P or the covering provider during after hours 7P-7A, please log into the web site [email or web address] and access using universal [place] password for that web  site. If you do not have the password, please call the hospital operator.  01/27/2022, 11:53 AM

## 2022-01-27 NOTE — Progress Notes (Signed)
[facility] Progress Note ? ?Jill Higgins 76 y.o. [birth year] ? ?CC:  Acute on chronic pancreatitis ? ? ?Subjective: ?Patient states her pain is the same as yesterday. She has been tolerating clear liquids well, though has pain. States she tried to eat grits yesterday and it made her sick so she would like to stick to clears for now. Has not had a bowel movement. ? ?ROS : Review of Systems  ?Respiratory:  Negative for cough and hemoptysis.   ?Cardiovascular:  Negative for chest pain and palpitations.  ?Gastrointestinal:  Positive for abdominal pain and nausea. Negative for blood in stool, constipation, diarrhea, heartburn, melena and vomiting.  ?Musculoskeletal:  Positive for back pain.   ? ? ?Objective: ?Vital signs in last 24 hours: ?Vitals:  ? 01/26/22 2004 01/27/22 0440  ?BP: (!) 149/68 120/68  ?Pulse: (!) 53 62  ?Resp: 18   ?Temp: 98 ?F (36.7 ?C)   ?SpO2: 100% 100%  ? ? ?Physical Exam: ? ?General:  Alert, cooperative, no distress, appears stated age  ?Head:  Normocephalic, without obvious abnormality, atraumatic  ?Eyes:  Anicteric sclera, EOM's intact  ?Lungs:   Clear to auscultation bilaterally, respirations unlabored  ?Heart:  Regular rate and rhythm, S1, S2 normal  ?Abdomen:   Soft, epigastric and RUQ tenderness to palpation, bowel sounds active all four quadrants,  no masses,   ? ? ?Lab Results: ?Recent Labs  ?  01/26/22 ?0124 01/27/22 ?0359  ?NA 141 141  ?K 3.1* 3.7  ?CL 103 105  ?CO2 30 25  ?GLUCOSE 81 83  ?BUN 7* <5*  ?CREATININE 0.70 0.68  ?CALCIUM 8.7* 8.8*  ?MG  --  1.4*  ? ?Recent Labs  ?  01/25/22 ?2229 01/26/22 ?0124  ?AST 19 18  ?ALT 12 11  ?ALKPHOS 67 59  ?BILITOT 0.2* 0.2*  ?PROT 6.3* 5.9*  ?ALBUMIN 3.1* 2.6*  ? ?Recent Labs  ?  01/25/22 ?7989 01/26/22 ?0124  ?WBC 8.6 6.4  ?NEUTROABS 6.3  --   ?HGB 12.8 12.4  ?HCT 40.1 36.3  ?MCV 95.9 95.0  ?PLT 352 352  ? ?No results for input(s): LABPROT, INR in the last 72 hours. ? ? ? ?Assessment ?Acute on chronic pancreatitis ?- lipase 70 ?-CT  abdomen pelvis: Moderate to marked severity of pancreatitis ?-Normal ANA, IgG subclass 4 ?-Triglycerides 75 3/8 ?-Normal renal function (BUN 7, creatinine 0.70) ?  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  ?Chronic diarrhea ?  ?History of diabetes, on metformin ? ? ?Plan: ?Continue IV fluids, lactated Ringer's at 150 cc an hour ?Continue clear liquid diet ?Continue pancreatic enzymes ?Continue supportive care and pain management with Dilaudid 1 Mg IV every 4 hours as needed, oxycodone 5 mg oral every 6 hours as needed. ?[facility] will follow ? ?Lauris Keepers Radford Pax PA-C ?01/27/2022, 9:30 AM ? ?Contact #  [phone]  ?

## 2022-01-27 NOTE — Plan of Care (Signed)

## 2022-01-27 NOTE — Progress Notes (Addendum)
Mobility Specialist Progress Note  ? ? 01/27/22 1616  ?Mobility  ?Activity Ambulated with assistance in room  ?Level of Assistance Contact guard assist, steadying assist  ?Assistive Device  ?(bed railings, wall)  ?Distance Ambulated (ft) 50 ft  ?Activity Response Tolerated fair  ?$Mobility charge 1 Mobility  ? ?Pt received in bed and agreeable. Grimacing in pain. Returned to bed with call bell in reach. Pt c/o feeling like she has a UTI coming on.  ? ?Hildred Alamin ?Mobility Specialist  ?M.S. 5N: [phone]  ?

## 2022-01-28 DIAGNOSIS — K861 Other chronic pancreatitis: Secondary | ICD-10-CM | POA: Diagnosis not present

## 2022-01-28 DIAGNOSIS — K859 Acute pancreatitis without necrosis or infection, unspecified: Secondary | ICD-10-CM | POA: Diagnosis not present

## 2022-01-28 LAB — URINALYSIS, ROUTINE W REFLEX MICROSCOPIC
Bilirubin Urine: NEGATIVE
Glucose, UA: NEGATIVE mg/dL
Hgb urine dipstick: NEGATIVE
Ketones, ur: NEGATIVE mg/dL
Leukocytes,Ua: NEGATIVE
Nitrite: NEGATIVE
Protein, ur: NEGATIVE mg/dL
Specific Gravity, Urine: 1.006 (ref 1.005–1.030)
pH: 8 (ref 5.0–8.0)

## 2022-01-28 LAB — GLUCOSE, CAPILLARY
Glucose-Capillary: 102 mg/dL — ABNORMAL HIGH (ref 70–99)
Glucose-Capillary: 132 mg/dL — ABNORMAL HIGH (ref 70–99)
Glucose-Capillary: 134 mg/dL — ABNORMAL HIGH (ref 70–99)
Glucose-Capillary: 149 mg/dL — ABNORMAL HIGH (ref 70–99)
Glucose-Capillary: 170 mg/dL — ABNORMAL HIGH (ref 70–99)
Glucose-Capillary: 277 mg/dL — ABNORMAL HIGH (ref 70–99)

## 2022-01-28 LAB — CBC
HCT: 33.9 % — ABNORMAL LOW (ref 36.0–46.0)
Hemoglobin: 11.4 g/dL — ABNORMAL LOW (ref 12.0–15.0)
MCH: 31.5 pg (ref 26.0–34.0)
MCHC: 33.6 g/dL (ref 30.0–36.0)
MCV: 93.6 fL (ref 80.0–100.0)
Platelets: 302 10*3/uL (ref 150–400)
RBC: 3.62 MIL/uL — ABNORMAL LOW (ref 3.87–5.11)
RDW: 13.6 % (ref 11.5–15.5)
WBC: 4.4 10*3/uL (ref 4.0–10.5)
nRBC: 0 % (ref 0.0–0.2)

## 2022-01-28 LAB — COMPREHENSIVE METABOLIC PANEL
ALT: 12 U/L (ref 0–44)
AST: 21 U/L (ref 15–41)
Albumin: 2.4 g/dL — ABNORMAL LOW (ref 3.5–5.0)
Alkaline Phosphatase: 55 U/L (ref 38–126)
Anion gap: 8 (ref 5–15)
BUN: <5 mg/dL — ABNORMAL LOW (ref 8–23)
CO2: 27 mmol/L (ref 22–32)
Calcium: 8.4 mg/dL — ABNORMAL LOW (ref 8.9–10.3)
Chloride: 104 mmol/L (ref 98–111)
Creatinine, Ser: 0.71 mg/dL (ref 0.44–1.00)
GFR, Estimated: >60 mL/min (ref >60)
Glucose, Bld: 106 mg/dL — ABNORMAL HIGH (ref 70–99)
Potassium: 3.7 mmol/L (ref 3.5–5.1)
Sodium: 139 mmol/L (ref 135–145)
Total Bilirubin: 0.2 mg/dL — ABNORMAL LOW (ref 0.3–1.2)
Total Protein: 5.4 g/dL — ABNORMAL LOW (ref 6.5–8.1)

## 2022-01-28 LAB — MAGNESIUM: Magnesium: 2 mg/dL (ref 1.7–2.4)

## 2022-01-28 NOTE — Progress Notes (Signed)
Subjective: ?Patient states that there is minimal improvement in abdominal pain. ?She walked several times in the room yesterday. ?She is passing gas but has not had a bowel movement. ?She wants to try full liquids for lunch today. ?She complains of frequent urination and states that she is concerned about a bladder, as she had urinary incontinence and frequency prior to being admitted for pancreatitis. ? ?Objective: ?Vital signs in last 24 hours: ?Temp:  [98 ?F (36.7 ?C)-98.4 ?F (36.9 ?C)] 98 ?F (36.7 ?C) (03/11 0755) ?Pulse Rate:  [55-63] 63 (03/11 0755) ?Resp:  [13-17] 13 (03/11 0755) ?BP: (136-160)/(71-87) 160/71 (03/11 0755) ?SpO2:  [95 %-100 %] 100 % (03/11 0411) ?Weight change:  ?Last BM Date :  (pta) ? ?PE: On room air, able to speak in full sentences ?GENERAL:More cheerful today, not in distress  ?ABDOMEN: Soft, bowel sounds normal active, mild tenderness in epigastric and right upper quadrant area ?EXTREMITIES: No deformity, no edema ? ?Lab Results: ?Results for orders placed or performed during the hospital encounter of 01/25/22 (from the past 48 hour(s))  ?Glucose, capillary     Status: Abnormal  ? Collection Time: 01/26/22 11:48 AM  ?Result Value Ref Range  ? Glucose-Capillary 191 (H) 70 - 99 mg/dL  ?  Comment: Glucose reference range applies only to samples taken after fasting for at least 8 hours.  ?Glucose, capillary     Status: Abnormal  ? Collection Time: 01/26/22  4:11 PM  ?Result Value Ref Range  ? Glucose-Capillary 128 (H) 70 - 99 mg/dL  ?  Comment: Glucose reference range applies only to samples taken after fasting for at least 8 hours.  ?Glucose, capillary     Status: None  ? Collection Time: 01/26/22  8:56 PM  ?Result Value Ref Range  ? Glucose-Capillary 89 70 - 99 mg/dL  ?  Comment: Glucose reference range applies only to samples taken after fasting for at least 8 hours.  ?Glucose, capillary     Status: Abnormal  ? Collection Time: 01/27/22  2:05 AM  ?Result Value Ref Range  ?  Glucose-Capillary 226 (H) 70 - 99 mg/dL  ?  Comment: Glucose reference range applies only to samples taken after fasting for at least 8 hours.  ?Basic metabolic panel     Status: Abnormal  ? Collection Time: 01/27/22  3:59 AM  ?Result Value Ref Range  ? Sodium 141 135 - 145 mmol/L  ? Potassium 3.7 3.5 - 5.1 mmol/L  ? Chloride 105 98 - 111 mmol/L  ? CO2 25 22 - 32 mmol/L  ? Glucose, Bld 83 70 - 99 mg/dL  ?  Comment: Glucose reference range applies only to samples taken after fasting for at least 8 hours.  ? BUN <5 (L) 8 - 23 mg/dL  ? Creatinine, Ser 0.68 0.44 - 1.00 mg/dL  ? Calcium 8.8 (L) 8.9 - 10.3 mg/dL  ? GFR, Estimated >60 >60 mL/min  ?  Comment: (NOTE) ?Calculated using the CKD-EPI Creatinine Equation (2021) ?  ? Anion gap 11 5 - 15  ?  Comment: Performed at [facility], [place] [address] [place], [place] [address]  ?Magnesium     Status: Abnormal  ? Collection Time: 01/27/22  3:59 AM  ?Result Value Ref Range  ? Magnesium 1.4 (L) 1.7 - 2.4 mg/dL  ?  Comment: Performed at [facility], [place] [address] [place], [place] [address]  ?Glucose, capillary     Status: None  ? Collection Time: 01/27/22  4:03  AM  ?Result Value Ref Range  ? Glucose-Capillary 79 70 - 99 mg/dL  ?  Comment: Glucose reference range applies only to samples taken after fasting for at least 8 hours.  ?Glucose, capillary     Status: None  ? Collection Time: 01/27/22  4:39 AM  ?Result Value Ref Range  ? Glucose-Capillary 78 70 - 99 mg/dL  ?  Comment: Glucose reference range applies only to samples taken after fasting for at least 8 hours.  ?Glucose, capillary     Status: Abnormal  ? Collection Time: 01/27/22  5:26 AM  ?Result Value Ref Range  ? Glucose-Capillary 117 (H) 70 - 99 mg/dL  ?  Comment: Glucose reference range applies only to samples taken after fasting for at least 8 hours.  ?Glucose, capillary     Status: Abnormal  ? Collection Time: 01/27/22 11:46 AM  ?Result Value Ref Range  ? Glucose-Capillary 208 (H) 70 - 99 mg/dL  ?   Comment: Glucose reference range applies only to samples taken after fasting for at least 8 hours.  ?Glucose, capillary     Status: Abnormal  ? Collection Time: 01/27/22  3:58 PM  ?Result Value Ref Range  ? Glucose-Capillary 151 (H) 70 - 99 mg/dL  ?  Comment: Glucose reference range applies only to samples taken after fasting for at least 8 hours.  ?Glucose, capillary     Status: Abnormal  ? Collection Time: 01/27/22  7:21 PM  ?Result Value Ref Range  ? Glucose-Capillary 254 (H) 70 - 99 mg/dL  ?  Comment: Glucose reference range applies only to samples taken after fasting for at least 8 hours.  ?Glucose, capillary     Status: Abnormal  ? Collection Time: 01/28/22 12:25 AM  ?Result Value Ref Range  ? Glucose-Capillary 149 (H) 70 - 99 mg/dL  ?  Comment: Glucose reference range applies only to samples taken after fasting for at least 8 hours.  ?Magnesium     Status: None  ? Collection Time: 01/28/22  3:25 AM  ?Result Value Ref Range  ? Magnesium 2.0 1.7 - 2.4 mg/dL  ?  Comment: Performed at [facility], [place] [address], [place], [place] [address]  ?CBC     Status: Abnormal  ? Collection Time: 01/28/22  3:25 AM  ?Result Value Ref Range  ? WBC 4.4 4.0 - 10.5 K/uL  ? RBC 3.62 (L) 3.87 - 5.11 MIL/uL  ? Hemoglobin 11.4 (L) 12.0 - 15.0 g/dL  ? HCT 33.9 (L) 36.0 - 46.0 %  ? MCV 93.6 80.0 - 100.0 fL  ? MCH 31.5 26.0 - 34.0 pg  ? MCHC 33.6 30.0 - 36.0 g/dL  ? RDW 13.6 11.5 - 15.5 %  ? Platelets 302 150 - 400 K/uL  ? nRBC 0.0 0.0 - 0.2 %  ?  Comment: Performed at [facility], [place] [address], [place], [place] [address]  ?Comprehensive metabolic panel     Status: Abnormal  ? Collection Time: 01/28/22  3:25 AM  ?Result Value Ref Range  ? Sodium 139 135 - 145 mmol/L  ? Potassium 3.7 3.5 - 5.1 mmol/L  ? Chloride 104 98 - 111 mmol/L  ? CO2 27 22 - 32 mmol/L  ? Glucose, Bld 106 (H) 70 - 99 mg/dL  ?  Comment: Glucose reference range applies only to samples taken after fasting for at least 8 hours.  ? BUN <5 (L) 8 - 23 mg/dL   ? Creatinine, Ser 0.71 0.44 - 1.00  mg/dL  ? Calcium 8.4 (L) 8.9 - 10.3 mg/dL  ? Total Protein 5.4 (L) 6.5 - 8.1 g/dL  ? Albumin 2.4 (L) 3.5 - 5.0 g/dL  ? AST 21 15 - 41 U/L  ? ALT 12 0 - 44 U/L  ? Alkaline Phosphatase 55 38 - 126 U/L  ? Total Bilirubin 0.2 (L) 0.3 - 1.2 mg/dL  ? GFR, Estimated >60 >60 mL/min  ?  Comment: (NOTE) ?Calculated using the CKD-EPI Creatinine Equation (2021) ?  ? Anion gap 8 5 - 15  ?  Comment: Performed at [facility], [place] [address] [place], [place] [address]  ?Glucose, capillary     Status: Abnormal  ? Collection Time: 01/28/22  4:09 AM  ?Result Value Ref Range  ? Glucose-Capillary 102 (H) 70 - 99 mg/dL  ?  Comment: Glucose reference range applies only to samples taken after fasting for at least 8 hours.  ?Glucose, capillary     Status: Abnormal  ? Collection Time: 01/28/22  7:56 AM  ?Result Value Ref Range  ? Glucose-Capillary 134 (H) 70 - 99 mg/dL  ?  Comment: Glucose reference range applies only to samples taken after fasting for at least 8 hours.  ? ? ?Studies/Results: ?No results found. ? ?Medications: I have reviewed the patient's current medications. ? ?Assessment: ?Acute on chronic pancreatitis ?History of pylorus-preserving Whipple's for low-grade intraepithelial neoplasm ? ?BUN less than 5, normal creatinine, normal LFTs, normal electrolytes ?Hemoglobin 11.4 ? ?History of chronic diarrhea-no bowel movement since admission(however, abdomen is soft, bowel sounds are present and patient is passing flatus, this is likely related to patient only being on clear liquids) ? ?Plan: ?We will advance diet to full liquids, advised patient to take small portions, try not to finish her entire meal if it worsens abdominal pain. ?Advised patient to walk around the hallways, stay out of bed on bedside chair. ?We will decrease IV fluids to 100 cc an hour. ?Continue boost 3 times a day ?Continue Creon 24,000 units 3 times a day. ?Continue pantoprazole 40 mg once a day. ?Improving  clinically. ? ? ?Ronnette Juniper, MD ?01/28/2022, 9:12 AM ? ?  ?

## 2022-01-28 NOTE — Progress Notes (Signed)
PROGRESS NOTE        PATIENT DETAILS Name: Jill Higgins Age: 76 y.o. Sex: female Date of Birth: 1946-01-14 Admit Date: 01/25/2022 Admitting Physician Evalee Mutton Kristeen Mans, MD FFM:[id number], Hal Hope, MD  Brief Summary: Patient is a 76 y.o.  female with pancreatitis of unknown etiology ( (s/p Whipple's procedure in 10/2019-as a FNA on 08/2019 was suspicious for tumor-however pathology from Whipple's procedure showed multifocal pancreatitis)-presented to the hospital with abdominal pain-was found to have acute on chronic pancreatitis.  Significant Hospital events: 3/8>> presented with abdominal pain-found to have acute on chronic pancreatitis.  Significant imaging studies: 3/8>> CT abdomen: Moderate to marked severity acute pancreatitis.  Prior cholecystectomy.  Significant microbiology data: 3/8>> COVID/influenza PCR: Negative  Procedures: None  Consults:  Gastroenterology  Subjective: Some upper epigastric pain continues but clearly is better than the past several days.  Complains of some back pain as well.  No vomiting.  Objective: Vitals: Blood pressure (!) 150/82, pulse 63, temperature 98 F (36.7 C), temperature source Oral, resp. rate 13, height '5\' 3"'$  (1.6 m), weight 54 kg, SpO2 100 %.   Exam: Gen Exam:Alert awake-not in any distress HEENT:atraumatic, normocephalic Chest: B/L clear to auscultation anteriorly CVS:S1S2 regular Abdomen: Soft-tender in epigastric area without any peritoneal signs. Extremities:no edema Neurology: Non focal Skin: no rash   Pertinent Labs/Radiology: CBC Latest Ref Rng & Units 01/28/2022 01/26/2022 01/25/2022  WBC 4.0 - 10.5 K/uL 4.4 6.4 8.6  Hemoglobin 12.0 - 15.0 g/dL 11.4(L) 12.4 12.8  Hematocrit 36.0 - 46.0 % 33.9(L) 36.3 40.1  Platelets 150 - 400 K/uL 302 352 352    Lab Results  Component Value Date   NA 139 01/28/2022   K 3.7 01/28/2022   CL 104 01/28/2022   CO2 27 01/28/2022      Assessment/Plan: Acute  on chronic pancreatitis: Unknown etiology-has had extensive work-up in the past-does have some intermittent alcohol consumption (does not binge-a few glasses of wine or pia colada several times a week).  Slowly improving-attempting to advance to full liquids today.  Continue supportive care-appreciate GI input.   Hypokalemia: Due to GI loss/vomiting.  Repleted.  HTN: BP stable-continue amlodipine/metoprolol.  History of PAD-s/p femoral endarterectomy/right femoropopliteal bypass 2014-and angioplasty in 2017: Stable for outpatient follow-up with vascular surgery-on aspirin.  DM-2: CBG stable with SSI.  Resume oral hypoglycemic agents on discharge.  Recent Labs    01/28/22 0025 01/28/22 0409 01/28/22 0756  GLUCAP 149* 102* 134*    BMI Estimated body mass index is 21.08 kg/m as calculated from the following:   Height as of this encounter: '5\' 3"'$  (1.6 m).   Weight as of this encounter: 54 kg.   Code status:   Code Status: Full Code   DVT Prophylaxis: enoxaparin (LOVENOX) injection 40 mg Start: 01/25/22 1100   Family Communication: Daughter-Vickie Buyer, retail voicemail on 3/10, 3/11   Disposition Plan: Status is: Inpatient Remains inpatient appropriate because: Resolving pancreatitis-not yet stable for discharge.   Planned Discharge Destination:Home   Diet: Diet Order             Diet clear liquid Room service appropriate? Yes; Fluid consistency: Thin  Diet effective now                     Antimicrobial agents: Anti-infectives (From admission, onward)    None  MEDICATIONS: Scheduled Meds:  (feeding supplement) PROSource Plus  30 mL Oral BID BM   amLODipine  5 mg Oral Daily   aspirin EC  81 mg Oral QHS   enoxaparin (LOVENOX) injection  40 mg Subcutaneous Q24H   feeding supplement  1 Container Oral TID BM   insulin aspart  0-9 Units Subcutaneous Q4H   lipase/protease/amylase  24,000 Units Oral TID AC   metoprolol succinate  50 mg  Oral Daily   multivitamin with minerals  1 tablet Oral Daily   pantoprazole (PROTONIX) IV  40 mg Intravenous Q24H   Continuous Infusions:  lactated ringers 100 mL/hr at 01/28/22 0919   PRN Meds:.acetaminophen **OR** acetaminophen, hydrALAZINE, HYDROmorphone (DILAUDID) injection, ondansetron **OR** ondansetron (ZOFRAN) IV, oxyCODONE   I have personally reviewed following labs and imaging studies  LABORATORY DATA: CBC: Recent Labs  Lab 01/25/22 0042 01/26/22 0124 01/28/22 0325  WBC 8.6 6.4 4.4  NEUTROABS 6.3  --   --   HGB 12.8 12.4 11.4*  HCT 40.1 36.3 33.9*  MCV 95.9 95.0 93.6  PLT 352 352 302     Basic Metabolic Panel: Recent Labs  Lab 01/25/22 0042 01/26/22 0124 01/27/22 0359 01/28/22 0325  NA 136 141 141 139  K 3.6 3.1* 3.7 3.7  CL 100 103 105 104  CO2 '27 30 25 27  '$ GLUCOSE 137* 81 83 106*  BUN 11 7* <5* <5*  CREATININE 0.75 0.70 0.68 0.71  CALCIUM 8.9 8.7* 8.8* 8.4*  MG  --   --  1.4* 2.0     GFR: Estimated Creatinine Clearance: 49.5 mL/min (by C-G formula based on SCr of 0.71 mg/dL).  Liver Function Tests: Recent Labs  Lab 01/25/22 0042 01/26/22 0124 01/28/22 0325  AST '19 18 21  '$ ALT '12 11 12  '$ ALKPHOS 67 59 55  BILITOT 0.2* 0.2* 0.2*  PROT 6.3* 5.9* 5.4*  ALBUMIN 3.1* 2.6* 2.4*    Recent Labs  Lab 01/25/22 0042  LIPASE 70*    No results for input(s): AMMONIA in the last 168 hours.  Coagulation Profile: No results for input(s): INR, PROTIME in the last 168 hours.  Cardiac Enzymes: No results for input(s): CKTOTAL, CKMB, CKMBINDEX, TROPONINI in the last 168 hours.  BNP (last 3 results) No results for input(s): PROBNP in the last 8760 hours.  Lipid Profile: Recent Labs    01/25/22 1430  TRIG 75     Thyroid Function Tests: No results for input(s): TSH, T4TOTAL, FREET4, T3FREE, THYROIDAB in the last 72 hours.  Anemia Panel: No results for input(s): VITAMINB12, FOLATE, FERRITIN, TIBC, IRON, RETICCTPCT in the last 72  hours.  Urine analysis:    Component Value Date/Time   COLORURINE YELLOW 01/25/2022 1224   APPEARANCEUR CLEAR 01/25/2022 1224   LABSPEC >1.046 (H) 01/25/2022 1224   PHURINE 7.0 01/25/2022 1224   GLUCOSEU NEGATIVE 01/25/2022 1224   HGBUR NEGATIVE 01/25/2022 1224   BILIRUBINUR NEGATIVE 01/25/2022 1224   KETONESUR 80 (A) 01/25/2022 1224   PROTEINUR NEGATIVE 01/25/2022 1224   UROBILINOGEN 0.2 09/29/2013 1359   NITRITE NEGATIVE 01/25/2022 1224   LEUKOCYTESUR NEGATIVE 01/25/2022 1224    Sepsis Labs: Lactic Acid, Venous    Component Value Date/Time   LATICACIDVEN 1.0 08/22/2019 2216    MICROBIOLOGY: Recent Results (from the past 240 hour(s))  Resp Panel by RT-PCR (Flu A&B, Covid) Nasopharyngeal Swab     Status: None   Collection Time: 01/25/22 10:24 AM   Specimen: Nasopharyngeal Swab; Nasopharyngeal(NP) swabs in vial transport medium  Result Value  Ref Range Status   SARS Coronavirus 2 by RT PCR NEGATIVE NEGATIVE Final    Comment: (NOTE) SARS-CoV-2 target nucleic acids are NOT DETECTED.  The SARS-CoV-2 RNA is generally detectable in upper respiratory specimens during the acute phase of infection. The lowest concentration of SARS-CoV-2 viral copies this assay can detect is 138 copies/mL. A negative result does not preclude SARS-Cov-2 infection and should not be used as the sole basis for treatment or other patient management decisions. A negative result may occur with  improper specimen collection/handling, submission of specimen other than nasopharyngeal swab, presence of viral mutation(s) within the areas targeted by this assay, and inadequate number of viral copies(<138 copies/mL). A negative result must be combined with clinical observations, patient history, and epidemiological information. The expected result is Negative.  Fact Sheet for Patients:  EntrepreneurPulse.com.au  Fact Sheet for Healthcare Providers:   IncredibleEmployment.be  This test is no t yet approved or cleared by the [place] FDA and  has been authorized for detection and/or diagnosis of SARS-CoV-2 by FDA under an Emergency Use Authorization (EUA). This EUA will remain  in effect (meaning this test can be used) for the duration of the COVID-19 declaration under Section 564(b)(1) of the Act, 21 U.S.C.section 360bbb-3(b)(1), unless the authorization is terminated  or revoked sooner.       Influenza A by PCR NEGATIVE NEGATIVE Final   Influenza B by PCR NEGATIVE NEGATIVE Final    Comment: (NOTE) The Xpert Xpress SARS-CoV-2/FLU/RSV plus assay is intended as an aid in the diagnosis of influenza from Nasopharyngeal swab specimens and should not be used as a sole basis for treatment. Nasal washings and aspirates are unacceptable for Xpert Xpress SARS-CoV-2/FLU/RSV testing.  Fact Sheet for Patients: EntrepreneurPulse.com.au  Fact Sheet for Healthcare Providers: IncredibleEmployment.be  This test is not yet approved or cleared by the [place] FDA and has been authorized for detection and/or diagnosis of SARS-CoV-2 by FDA under an Emergency Use Authorization (EUA). This EUA will remain in effect (meaning this test can be used) for the duration of the COVID-19 declaration under Section 564(b)(1) of the Act, 21 U.S.C. section 360bbb-3(b)(1), unless the authorization is terminated or revoked.  Performed at [facility], [place] [address], [place], [place] [address]     RADIOLOGY STUDIES/RESULTS: No results found.   LOS: 3 days   Oren Binet, MD  [facility]    To contact the attending provider between 7A-7P or the covering provider during after hours 7P-7A, please log into the web site [email or web address] and access using universal [place] password for that web site. If you do not have the password, please call the hospital  operator.  01/28/2022, 11:29 AM

## 2022-01-28 NOTE — Plan of Care (Signed)
  Problem: Education: Goal: Knowledge of General Education information will improve Description: Including pain rating scale, medication(s)/side effects and non-pharmacologic comfort measures Outcome: Progressing   Problem: Activity: Goal: Risk for activity intolerance will decrease Outcome: Progressing   Problem: Pain Managment: Goal: General experience of comfort will improve Outcome: Progressing   

## 2022-01-29 DIAGNOSIS — K859 Acute pancreatitis without necrosis or infection, unspecified: Secondary | ICD-10-CM | POA: Diagnosis not present

## 2022-01-29 LAB — BASIC METABOLIC PANEL
Anion gap: 11 (ref 5–15)
BUN: <5 mg/dL — ABNORMAL LOW (ref 8–23)
CO2: 22 mmol/L (ref 22–32)
Calcium: 8.4 mg/dL — ABNORMAL LOW (ref 8.9–10.3)
Chloride: 103 mmol/L (ref 98–111)
Creatinine, Ser: 0.63 mg/dL (ref 0.44–1.00)
GFR, Estimated: >60 mL/min (ref >60)
Glucose, Bld: 137 mg/dL — ABNORMAL HIGH (ref 70–99)
Potassium: 3.6 mmol/L (ref 3.5–5.1)
Sodium: 136 mmol/L (ref 135–145)

## 2022-01-29 LAB — GLUCOSE, CAPILLARY
Glucose-Capillary: 139 mg/dL — ABNORMAL HIGH (ref 70–99)
Glucose-Capillary: 143 mg/dL — ABNORMAL HIGH (ref 70–99)
Glucose-Capillary: 235 mg/dL — ABNORMAL HIGH (ref 70–99)
Glucose-Capillary: 209 mg/dL — ABNORMAL HIGH (ref 70–99)
Glucose-Capillary: 172 mg/dL — ABNORMAL HIGH (ref 70–99)

## 2022-01-29 MED ORDER — DIPHENHYDRAMINE HCL 25 MG PO CAPS
25.0000 mg | ORAL_CAPSULE | Freq: Once | ORAL | Status: AC
Start: 2022-01-30 — End: 2022-01-30
  Administered 2022-01-30: 25 mg via ORAL
  Filled 2022-01-29: qty 1

## 2022-01-29 MED ORDER — OXYCODONE HCL 5 MG PO TABS
10.0000 mg | ORAL_TABLET | Freq: Four times a day (QID) | ORAL | Status: DC | PRN
  Administered 2022-01-29 – 2022-01-30 (×4): 10 mg via ORAL
  Filled 2022-01-29 (×4): qty 2

## 2022-01-29 NOTE — Progress Notes (Addendum)
?PROGRESS NOTE ? ?Jill Higgins  YTK:[id number] DOB: 20-Jun-1946 DOA: 01/25/2022 ?PCP: Carol Ada, MD  ? ?Brief Narrative: ?Patient is a 76 year old female with history of recurrent DVT pancreatitis, status post Whipple procedure who presented here with abdominal pain.  She was admitted for the management of acute on chronic pancreatitis.  CT abdomen showed moderate to marked severity acute pancreatitis.  Hospital course remarkable for persistent pain.  Gastroenterology following. ? ?Assessment & Plan: ? ?Principal Problem: ?  Pancreatitis ?Active Problems: ?  Hypertension ?  Hyperlipidemia ?  PAD (peripheral artery disease) ([place]) ?  Type 2 diabetes mellitus with vascular disease ([place]) ? ? ?Acute on chronic pancreatitis: History of recurrent idiopathic pancreatitis.  Extensive work-up was done in the past.  Has history of intermittent alcohol consumption so less suspicious for this etiology. ?CT abdomen on presentation showed moderate to severe features of acute pancreatitis.  Lipase was minimally elevated ?Hospital course medical for persistent pain.  Diet was advanced to full liquid on 3/11 but she continues to complain of severe pain so we will revert the diet to clear liquid diet today. ?Continue pain management, supportive care.  Continue IV fluids, antiemetics ?Gastroenterology also following ? ?Hypokalemia: Continue to monitor and supplement as needed ? ?Hypertension: Currently blood pressure stable.  On amlodipine, metoprolol ? ?History of peripheral artery disease: Status post femoral endarterectomy, right femoral-popliteal bypass and angioplasty.  She follows with vascular surgery as an outpatient ? ?Diabetes type 2: Currently on sliding scale insulin.  Monitor blood sugars. ? ? ? ?  ? ? ?Nutrition Problem: Inadequate oral intake ?Etiology: acute illness (pancreatitis) ?  ? ?DVT prophylaxis:enoxaparin (LOVENOX) injection 40 mg Start: 01/25/22 1100 ? ? ?  Code Status: Full Code ? ?Family Communication:  Daughter on phone on 3/12 ? ?Patient status: Inpatient ? ?Patient is from : Home ? ?Anticipated discharge to: Home ? ?Estimated DC date: In next 2 to 3 days ? ? ?Consultants: GI ? ?Procedures: None ? ?Antimicrobials:  ?Anti-infectives (From admission, onward)  ? ? None  ? ?  ? ? ?Subjective: ? ?Patient seen and examined at the bedside this morning.  Hemodynamically stable.  She was having her breakfast but was also in severe abdominal pain.  Her pain was mainly on the left upper quadrant today with radiation to her back.  She does not have bowel movement but is passing gas.  Had a long discussion at the bedside.  She initially wanted to continue full liquid diet today but I was reported afterwards that she wants to go back to clear liquid diet because of persistent pain. ? ?Objective: ?Vitals:  ? 01/28/22 1442 01/28/22 2000 01/29/22 0432 01/29/22 0810  ?BP: 129/67 (!) 156/66 137/70 133/66  ?Pulse: (!) 57 68 (!) 56   ?Resp: '15 16  15  '$ ?Temp: 98.4 ?F (36.9 ?C) 98 ?F (36.7 ?C) 98.6 ?F (37 ?C) 98.5 ?F (36.9 ?C)  ?TempSrc: Oral Oral Oral Oral  ?SpO2:  100% 96% 97%  ?Weight:      ?Height:      ? ? ?Intake/Output Summary (Last 24 hours) at 01/29/2022 1135 ?Last data filed at 01/29/2022 0900 ?Gross per 24 hour  ?Intake 100 ml  ?Output 2650 ml  ?Net -2550 ml  ? ?Filed Weights  ? 01/25/22 0814  ?Weight: 54 kg  ? ? ?Examination: ? ?General exam: In moderate distress due to abdominal discomfort ?HEENT: PERRL ?Respiratory system:  no wheezes or crackles  ?Cardiovascular system: S1 & S2 heard, RRR.  ?Gastrointestinal system:  Abdomen is nondistended, soft .  Tenderness on the epigastric and left upper quadrant.  Bowel sounds present but slow ?Central nervous system: Alert and oriented ?Extremities: No edema, no clubbing ,no cyanosis ?Skin: No rashes, no ulcers,no icterus   ? ? ?Data Reviewed: I have personally reviewed following labs and imaging studies ? ?CBC: ?Recent Labs  ?Lab 01/25/22 ?0623 01/26/22 ?0124 01/28/22 ?0325  ?WBC  8.6 6.4 4.4  ?NEUTROABS 6.3  --   --   ?HGB 12.8 12.4 11.4*  ?HCT 40.1 36.3 33.9*  ?MCV 95.9 95.0 93.6  ?PLT 352 352 302  ? ?Basic Metabolic Panel: ?Recent Labs  ?Lab 01/25/22 ?7628 01/26/22 ?0124 01/27/22 ?0359 01/28/22 ?0325 01/29/22 ?0015  ?NA 136 141 141 139 136  ?K 3.6 3.1* 3.7 3.7 3.6  ?CL 100 103 105 104 103  ?CO2 '27 30 25 27 22  '$ ?GLUCOSE 137* 81 83 106* 137*  ?BUN 11 7* <5* <5* <5*  ?CREATININE 0.75 0.70 0.68 0.71 0.63  ?CALCIUM 8.9 8.7* 8.8* 8.4* 8.4*  ?MG  --   --  1.4* 2.0  --   ? ? ? ?Recent Results (from the past 240 hour(s))  ?Resp Panel by RT-PCR (Flu A&B, Covid) Nasopharyngeal Swab     Status: None  ? Collection Time: 01/25/22 10:24 AM  ? Specimen: Nasopharyngeal Swab; Nasopharyngeal(NP) swabs in vial transport medium  ?Result Value Ref Range Status  ? SARS Coronavirus 2 by RT PCR NEGATIVE NEGATIVE Final  ?  Comment: (NOTE) ?SARS-CoV-2 target nucleic acids are NOT DETECTED. ? ?The SARS-CoV-2 RNA is generally detectable in upper respiratory ?specimens during the acute phase of infection. The lowest ?concentration of SARS-CoV-2 viral copies this assay can detect is ?138 copies/mL. A negative result does not preclude SARS-Cov-2 ?infection and should not be used as the sole basis for treatment or ?other patient management decisions. A negative result may occur with  ?improper specimen collection/handling, submission of specimen other ?than nasopharyngeal swab, presence of viral mutation(s) within the ?areas targeted by this assay, and inadequate number of viral ?copies(<138 copies/mL). A negative result must be combined with ?clinical observations, patient history, and epidemiological ?information. The expected result is Negative. ? ?Fact Sheet for Patients:  ?EntrepreneurPulse.com.au ? ?Fact Sheet for Healthcare Providers:  ?IncredibleEmployment.be ? ?This test is no t yet approved or cleared by the [place] FDA and  ?has been authorized for detection and/or  diagnosis of SARS-CoV-2 by ?FDA under an Emergency Use Authorization (EUA). This EUA will remain  ?in effect (meaning this test can be used) for the duration of the ?COVID-19 declaration under Section 564(b)(1) of the Act, 21 ?U.S.C.section 360bbb-3(b)(1), unless the authorization is terminated  ?or revoked sooner.  ? ? ?  ? Influenza A by PCR NEGATIVE NEGATIVE Final  ? Influenza B by PCR NEGATIVE NEGATIVE Final  ?  Comment: (NOTE) ?The Xpert Xpress SARS-CoV-2/FLU/RSV plus assay is intended as an aid ?in the diagnosis of influenza from Nasopharyngeal swab specimens and ?should not be used as a sole basis for treatment. Nasal washings and ?aspirates are unacceptable for Xpert Xpress SARS-CoV-2/FLU/RSV ?testing. ? ?Fact Sheet for Patients: ?EntrepreneurPulse.com.au ? ?Fact Sheet for Healthcare Providers: ?IncredibleEmployment.be ? ?This test is not yet approved or cleared by the [place] FDA and ?has been authorized for detection and/or diagnosis of SARS-CoV-2 by ?FDA under an Emergency Use Authorization (EUA). This EUA will remain ?in effect (meaning this test can be used) for the duration of the ?COVID-19 declaration under Section 564(b)(1) of the Act, 21  U.S.C. ?section 360bbb-3(b)(1), unless the authorization is terminated or ?revoked. ? ?Performed at [facility], [place] [address] [place], [place] ?[address] ?  ?  ? ?Radiology Studies: ?No results found. ? ?Scheduled Meds: ? (feeding supplement) PROSource Plus  30 mL Oral BID BM  ? amLODipine  5 mg Oral Daily  ? aspirin EC  81 mg Oral QHS  ? enoxaparin (LOVENOX) injection  40 mg Subcutaneous Q24H  ? feeding supplement  1 Container Oral TID BM  ? insulin aspart  0-9 Units Subcutaneous Q4H  ? lipase/protease/amylase  24,000 Units Oral TID AC  ? metoprolol succinate  50 mg Oral Daily  ? multivitamin with minerals  1 tablet Oral Daily  ? pantoprazole (PROTONIX) IV  40 mg Intravenous Q24H  ? ?Continuous Infusions: ?  lactated ringers 100 mL/hr at 01/29/22 0307  ? ? ? LOS: 4 days  ? ?Shelly Coss, MD ?[facility] ?P3/10/2022, 11:35 AM   ?

## 2022-01-30 ENCOUNTER — Other Ambulatory Visit: Payer: Self-pay | Admitting: Endocrinology

## 2022-01-30 LAB — GLUCOSE, CAPILLARY
Glucose-Capillary: 124 mg/dL — ABNORMAL HIGH (ref 70–99)
Glucose-Capillary: 200 mg/dL — ABNORMAL HIGH (ref 70–99)
Glucose-Capillary: 126 mg/dL — ABNORMAL HIGH (ref 70–99)
Glucose-Capillary: 189 mg/dL — ABNORMAL HIGH (ref 70–99)

## 2022-01-30 MED ORDER — DIPHENHYDRAMINE HCL 25 MG PO CAPS
25.0000 mg | ORAL_CAPSULE | Freq: Four times a day (QID) | ORAL | Status: DC | PRN
  Administered 2022-01-30: 25 mg via ORAL
  Filled 2022-01-30: qty 1

## 2022-01-30 NOTE — Progress Notes (Signed)
Mobility Specialist: Progress Note ? ? 01/30/22 1106  ?Mobility  ?Activity Ambulated with assistance in room;Transferred from bed to chair  ?Level of Assistance Contact guard assist, steadying assist  ?Assistive Device  ?(IV pole)  ?Distance Ambulated (ft) 6 ft  ?Activity Response Tolerated fair  ?$Mobility charge 1 Mobility  ? ?Pt received in bed and agreeable to mobility. C/o abdominal pain upon sitting EOB, no rating given. Deferred ambulation and transferred to the chair. Pt is in the recliner with call bell at her side and all needs met.  ? ?[name] Jill Higgins ?Mobility Specialist ?Mobility Specialist [place]: [phone] ?Mobility Specialist [place]: [phone] ? ?

## 2022-01-30 NOTE — Plan of Care (Signed)
°  Problem: Education: °Goal: Knowledge of General Education information will improve °Description: Including pain rating scale, medication(s)/side effects and non-pharmacologic comfort measures °Outcome: Progressing °  °Problem: Health Behavior/Discharge Planning: °Goal: Ability to manage health-related needs will improve °Outcome: Progressing °  °Problem: Nutrition: °Goal: Adequate nutrition will be maintained °Outcome: Progressing °  °Problem: Coping: °Goal: Level of anxiety will decrease °Outcome: Progressing °  °Problem: Elimination: °Goal: Will not experience complications related to bowel motility °Outcome: Progressing °Goal: Will not experience complications related to urinary retention °Outcome: Progressing °  °Problem: Pain Managment: °Goal: General experience of comfort will improve °Outcome: Progressing °  °Problem: Safety: °Goal: Ability to remain free from injury will improve °Outcome: Progressing °  °Problem: Skin Integrity: °Goal: Risk for impaired skin integrity will decrease °Outcome: Progressing °  °

## 2022-01-30 NOTE — Plan of Care (Signed)

## 2022-01-30 NOTE — Progress Notes (Signed)
?PROGRESS NOTE ? ?Jill Higgins  FYB:[id number] DOB: [birth year] DOA: 01/25/2022 ?PCP: Carol Ada, MD  ? ?Brief Narrative: ?Patient is a 76 year old female with history of recurrent DVT pancreatitis, status post Whipple procedure who presented here with abdominal pain.  She was admitted for the management of acute on chronic pancreatitis.  CT abdomen showed moderate to marked severity acute pancreatitis.  Hospital course remarkable for persistent pain.  Gastroenterology were following.Plan for follow up CT abd. ? ?Assessment & Plan: ? ?Principal Problem: ?  Pancreatitis ?Active Problems: ?  Hypertension ?  Hyperlipidemia ?  PAD (peripheral artery disease) ([place]) ?  Type 2 diabetes mellitus with vascular disease ([place]) ? ? ?Acute on chronic pancreatitis: History of recurrent idiopathic pancreatitis.  Extensive work-up was done in the past.  Has history of intermittent alcohol consumption so less suspicious for this etiology. ?CT abdomen on presentation showed moderate to severe features of acute pancreatitis.  Lipase was minimally elevated ?Hospital course medical for persistent pain.  Diet was advanced to full liquid on 3/11 but she continued to complain of severe pain so we reverted the diet to clear liquid diet on 3/12. ?Continue pain management, supportive care.  Continue IV fluids, antiemetics ?Gastroenterology were also following. ?Abdomen pain is better today but she has severe tenderness on the right upper quadrant.  We will do a follow-up CT abdomen/pelvis with contrast. ?We might advance diet to full liquid today after getting the report of CT abdomen ? ?Hypokalemia: Continue to monitor and supplement as needed ? ?Hypertension: Currently blood pressure stable.  On amlodipine, metoprolol ? ?History of peripheral artery disease: Status post femoral endarterectomy, right femoral-popliteal bypass and angioplasty.  She follows with vascular surgery as an outpatient ? ?Diabetes type 2: Currently on sliding scale  insulin.  Monitor blood sugars. ? ? ? ?  ? ? ?Nutrition Problem: Inadequate oral intake ?Etiology: acute illness (pancreatitis) ?  ? ?DVT prophylaxis:enoxaparin (LOVENOX) injection 40 mg Start: 01/25/22 1100 ? ? ?  Code Status: Full Code ? ?Family Communication: Daughter on phone on 3/12 ? ?Patient status: Inpatient ? ?Patient is from : Home ? ?Anticipated discharge to: Home ? ?Estimated DC date: In next 2 to 3 days ? ? ?Consultants: GI ? ?Procedures: None ? ?Antimicrobials:  ?Anti-infectives (From admission, onward)  ? ? None  ? ?  ? ? ?Subjective: ? ?Patient seen and examined at the bedside this morning.  Hemodynamically stable.  She looks better today no nausea, vomiting or worsening abdomen pain but continues to have discomfort on the right upper quadrant. ? ?Objective: ?Vitals:  ? 01/30/22 0232 01/30/22 0400 01/30/22 0747 01/30/22 0843  ?BP:  (!) 158/65 131/73   ?Pulse:    64  ?Resp: '16 13 14 15  '$ ?Temp:  97.9 ?F (36.6 ?C) 98.9 ?F (37.2 ?C)   ?TempSrc:  Axillary Oral   ?SpO2:  99% 98%   ?Weight:      ?Height:      ? ? ?Intake/Output Summary (Last 24 hours) at 01/30/2022 1021 ?Last data filed at 01/30/2022 0558 ?Gross per 24 hour  ?Intake 4692.43 ml  ?Output 1250 ml  ?Net 3442.43 ml  ? ?Filed Weights  ? 01/25/22 0814  ?Weight: 54 kg  ? ? ?Examination: ? ?General exam: Overall comfortable, not in distress,weak ?HEENT: PERRL ?Respiratory system:  no wheezes or crackles  ?Cardiovascular system: S1 & S2 heard, RRR.  ?Gastrointestinal system: Abdomen is nondistended, soft .  Tenderness on the right upper quadrant ?Central nervous system: Alert and oriented ?  Extremities: No edema, no clubbing ,no cyanosis ?Skin: No rashes, no ulcers,no icterus   ? ? ?Data Reviewed: I have personally reviewed following labs and imaging studies ? ?CBC: ?Recent Labs  ?Lab 01/25/22 ?7902 01/26/22 ?0124 01/28/22 ?0325  ?WBC 8.6 6.4 4.4  ?NEUTROABS 6.3  --   --   ?HGB 12.8 12.4 11.4*  ?HCT 40.1 36.3 33.9*  ?MCV 95.9 95.0 93.6  ?PLT 352 352 302   ? ?Basic Metabolic Panel: ?Recent Labs  ?Lab 01/25/22 ?4097 01/26/22 ?0124 01/27/22 ?0359 01/28/22 ?0325 01/29/22 ?0015  ?NA 136 141 141 139 136  ?K 3.6 3.1* 3.7 3.7 3.6  ?CL 100 103 105 104 103  ?CO2 '27 30 25 27 22  '$ ?GLUCOSE 137* 81 83 106* 137*  ?BUN 11 7* <5* <5* <5*  ?CREATININE 0.75 0.70 0.68 0.71 0.63  ?CALCIUM 8.9 8.7* 8.8* 8.4* 8.4*  ?MG  --   --  1.4* 2.0  --   ? ? ? ?Recent Results (from the past 240 hour(s))  ?Resp Panel by RT-PCR (Flu A&B, Covid) Nasopharyngeal Swab     Status: None  ? Collection Time: 01/25/22 10:24 AM  ? Specimen: Nasopharyngeal Swab; Nasopharyngeal(NP) swabs in vial transport medium  ?Result Value Ref Range Status  ? SARS Coronavirus 2 by RT PCR NEGATIVE NEGATIVE Final  ?  Comment: (NOTE) ?SARS-CoV-2 target nucleic acids are NOT DETECTED. ? ?The SARS-CoV-2 RNA is generally detectable in upper respiratory ?specimens during the acute phase of infection. The lowest ?concentration of SARS-CoV-2 viral copies this assay can detect is ?138 copies/mL. A negative result does not preclude SARS-Cov-2 ?infection and should not be used as the sole basis for treatment or ?other patient management decisions. A negative result may occur with  ?improper specimen collection/handling, submission of specimen other ?than nasopharyngeal swab, presence of viral mutation(s) within the ?areas targeted by this assay, and inadequate number of viral ?copies(<138 copies/mL). A negative result must be combined with ?clinical observations, patient history, and epidemiological ?information. The expected result is Negative. ? ?Fact Sheet for Patients:  ?EntrepreneurPulse.com.au ? ?Fact Sheet for Healthcare Providers:  ?IncredibleEmployment.be ? ?This test is no t yet approved or cleared by the [place] FDA and  ?has been authorized for detection and/or diagnosis of SARS-CoV-2 by ?FDA under an Emergency Use Authorization (EUA). This EUA will remain  ?in effect (meaning this  test can be used) for the duration of the ?COVID-19 declaration under Section 564(b)(1) of the Act, 21 ?U.S.C.section 360bbb-3(b)(1), unless the authorization is terminated  ?or revoked sooner.  ? ? ?  ? Influenza A by PCR NEGATIVE NEGATIVE Final  ? Influenza B by PCR NEGATIVE NEGATIVE Final  ?  Comment: (NOTE) ?The Xpert Xpress SARS-CoV-2/FLU/RSV plus assay is intended as an aid ?in the diagnosis of influenza from Nasopharyngeal swab specimens and ?should not be used as a sole basis for treatment. Nasal washings and ?aspirates are unacceptable for Xpert Xpress SARS-CoV-2/FLU/RSV ?testing. ? ?Fact Sheet for Patients: ?EntrepreneurPulse.com.au ? ?Fact Sheet for Healthcare Providers: ?IncredibleEmployment.be ? ?This test is not yet approved or cleared by the [place] FDA and ?has been authorized for detection and/or diagnosis of SARS-CoV-2 by ?FDA under an Emergency Use Authorization (EUA). This EUA will remain ?in effect (meaning this test can be used) for the duration of the ?COVID-19 declaration under Section 564(b)(1) of the Act, 21 U.S.C. ?section 360bbb-3(b)(1), unless the authorization is terminated or ?revoked. ? ?Performed at [facility], [place] [address], [place], [place] ?[address] ?  ?  ? ?  Radiology Studies: ?No results found. ? ?Scheduled Meds: ? (feeding supplement) PROSource Plus  30 mL Oral BID BM  ? amLODipine  5 mg Oral Daily  ? aspirin EC  81 mg Oral QHS  ? enoxaparin (LOVENOX) injection  40 mg Subcutaneous Q24H  ? feeding supplement  1 Container Oral TID BM  ? insulin aspart  0-9 Units Subcutaneous Q4H  ? lipase/protease/amylase  24,000 Units Oral TID AC  ? metoprolol succinate  50 mg Oral Daily  ? multivitamin with minerals  1 tablet Oral Daily  ? pantoprazole (PROTONIX) IV  40 mg Intravenous Q24H  ? ?Continuous Infusions: ? lactated ringers 100 mL/hr at 01/30/22 0122  ? ? ? LOS: 5 days  ? ?Shelly Coss, MD ?[facility] ?P3/13/2023, 10:21 AM    ?

## 2022-01-30 NOTE — Progress Notes (Signed)
This nurse did not give pt Metoprolol with HR at 60-61. Dr. Shelly Coss was made aware and agreed. ?

## 2022-01-31 ENCOUNTER — Inpatient Hospital Stay (HOSPITAL_COMMUNITY): Payer: No Typology Code available for payment source

## 2022-01-31 DIAGNOSIS — K8689 Other specified diseases of pancreas: Secondary | ICD-10-CM | POA: Diagnosis not present

## 2022-01-31 DIAGNOSIS — K5900 Constipation, unspecified: Secondary | ICD-10-CM | POA: Diagnosis not present

## 2022-01-31 LAB — CBC WITH DIFFERENTIAL/PLATELET
Abs Immature Granulocytes: 0.01 10*3/uL (ref 0.00–0.07)
Basophils Absolute: 0 10*3/uL (ref 0.0–0.1)
Basophils Relative: 1 %
Eosinophils Absolute: 0.3 10*3/uL (ref 0.0–0.5)
Eosinophils Relative: 6 %
HCT: 29.8 % — ABNORMAL LOW (ref 36.0–46.0)
Hemoglobin: 10.1 g/dL — ABNORMAL LOW (ref 12.0–15.0)
Immature Granulocytes: 0 %
Lymphocytes Relative: 49 %
Lymphs Abs: 2 10*3/uL (ref 0.7–4.0)
MCH: 32.2 pg (ref 26.0–34.0)
MCHC: 33.9 g/dL (ref 30.0–36.0)
MCV: 94.9 fL (ref 80.0–100.0)
Monocytes Absolute: 0.3 10*3/uL (ref 0.1–1.0)
Monocytes Relative: 8 %
Neutro Abs: 1.5 10*3/uL — ABNORMAL LOW (ref 1.7–7.7)
Neutrophils Relative %: 36 %
Platelets: 278 10*3/uL (ref 150–400)
RBC: 3.14 MIL/uL — ABNORMAL LOW (ref 3.87–5.11)
RDW: 13.6 % (ref 11.5–15.5)
WBC: 4.1 10*3/uL (ref 4.0–10.5)
nRBC: 0 % (ref 0.0–0.2)

## 2022-01-31 LAB — COMPREHENSIVE METABOLIC PANEL
ALT: 14 U/L (ref 0–44)
AST: 21 U/L (ref 15–41)
Albumin: 2.1 g/dL — ABNORMAL LOW (ref 3.5–5.0)
Alkaline Phosphatase: 50 U/L (ref 38–126)
Anion gap: 7 (ref 5–15)
BUN: <5 mg/dL — ABNORMAL LOW (ref 8–23)
CO2: 25 mmol/L (ref 22–32)
Calcium: 8 mg/dL — ABNORMAL LOW (ref 8.9–10.3)
Chloride: 106 mmol/L (ref 98–111)
Creatinine, Ser: 0.7 mg/dL (ref 0.44–1.00)
GFR, Estimated: >60 mL/min (ref >60)
Glucose, Bld: 238 mg/dL — ABNORMAL HIGH (ref 70–99)
Potassium: 3.3 mmol/L — ABNORMAL LOW (ref 3.5–5.1)
Sodium: 138 mmol/L (ref 135–145)
Total Bilirubin: <0.1 mg/dL — ABNORMAL LOW (ref 0.3–1.2)
Total Protein: 4.8 g/dL — ABNORMAL LOW (ref 6.5–8.1)

## 2022-01-31 LAB — GLUCOSE, CAPILLARY
Glucose-Capillary: 161 mg/dL — ABNORMAL HIGH (ref 70–99)
Glucose-Capillary: 117 mg/dL — ABNORMAL HIGH (ref 70–99)
Glucose-Capillary: 208 mg/dL — ABNORMAL HIGH (ref 70–99)

## 2022-01-31 MED ORDER — IOHEXOL 9 MG/ML PO SOLN
ORAL | Status: AC
  Administered 2022-01-31: 500 mL
  Filled 2022-01-31: qty 1000

## 2022-01-31 MED ORDER — POLYETHYLENE GLYCOL 3350 17 G PO PACK
17.0000 g | PACK | Freq: Every day | ORAL | Status: DC
  Administered 2022-01-31: 17 g via ORAL
  Filled 2022-01-31: qty 1

## 2022-01-31 MED ORDER — PANCRELIPASE (LIP-PROT-AMYL) 24000-76000 UNITS PO CPEP: 24000.0000 [IU] | ORAL_CAPSULE | Freq: Three times a day (TID) | ORAL | 0 refills | Status: AC

## 2022-01-31 MED ORDER — POLYETHYLENE GLYCOL 3350 17 G PO PACK
17.0000 g | PACK | Freq: Every day | ORAL | 0 refills | Status: DC | PRN
Start: 2022-01-31 — End: 2023-12-07

## 2022-01-31 MED ORDER — IOHEXOL 300 MG/ML  SOLN
100.0000 mL | Freq: Once | INTRAMUSCULAR | Status: AC | PRN
  Administered 2022-01-31: 100 mL via INTRAVENOUS

## 2022-01-31 MED ORDER — POTASSIUM CHLORIDE CRYS ER 20 MEQ PO TBCR: 40.0000 meq | EXTENDED_RELEASE_TABLET | Freq: Every day | ORAL | 0 refills | Status: DC

## 2022-01-31 MED ORDER — POTASSIUM CHLORIDE CRYS ER 20 MEQ PO TBCR
40.0000 meq | EXTENDED_RELEASE_TABLET | Freq: Once | ORAL | Status: AC
Start: 2022-01-31 — End: 2022-01-31
  Administered 2022-01-31: 40 meq via ORAL
  Filled 2022-01-31: qty 2

## 2022-01-31 NOTE — Discharge Summary (Signed)
40Physician Discharge Summary  ?Jill Higgins AVW:[id number] DOB: 07-Feb-1946 DOA: 01/25/2022 ? ?PCP: Carol Ada, MD ? ?Admit date: 01/25/2022 ?Discharge date: 01/31/2022 ? ?Admitted From: Home ?Disposition:  Home ? ?Discharge Condition:Stable ?CODE STATUS:FULL ?Diet recommendation: Heart Healthy ? ?Brief/Interim Summary: ? ?Patient is a 76 year old female with history of recurrent DVT pancreatitis, status post Whipple procedure who presented here with abdominal pain.  She was admitted for the management of acute on chronic pancreatitis.  CT abdomen showed moderate to marked severity acute pancreatitis.  Hospital course remarkable for persistent pain.  Gastroenterology were following.  Abdominal pain has improved today, she had bowel movement and she tolerated.  Follow-up CT scan showed improvement in the pancreatitis.  She is medically stable for discharge home today. ? ?Following problems were addressed during her hospitalization: ? ?Acute on chronic pancreatitis: History of recurrent idiopathic pancreatitis.  Extensive work-up was done in the past.  Has history of intermittent alcohol consumption so less suspicious for this etiology. ?CT abdomen on presentation showed moderate to severe features of acute pancreatitis.  Lipase was minimally elevated ?Hospital course medical for persistent pain.  Diet was advanced to full liquid on 3/11 but she continued to complain of severe pain so we reverted the diet to clear liquid diet on 3/12. ?Gastroenterology were also following. ?Abdomen pain is better today . F/U  CT abdomen/pelvis with contrast showed regression of pancreatic inflammation ?She tolerated solid diet today.  Started on Creon.  We recommend to follow-up with the gastroenterologist as an outpatient ?  ?Hypokalemia: Supplemented ? ?Hypertension: Currently blood pressure stable.  On amlodipine, metoprolol ?  ?History of peripheral artery disease: Status post femoral endarterectomy, right femoral-popliteal bypass  and angioplasty.  She follows with vascular surgery as an outpatient ?  ?Diabetes type 2: Continue home regimen ? ?Discharge Diagnoses:  ?Principal Problem: ?  Pancreatitis ?Active Problems: ?  Hypertension ?  Hyperlipidemia ?  PAD (peripheral artery disease) ([place]) ?  Type 2 diabetes mellitus with vascular disease ([place]) ? ? ? ?Discharge Instructions ? ?Discharge Instructions   ? ? Diet - low sodium heart healthy   Complete by: As directed ?  ? Discharge instructions   Complete by: As directed ?  ? 1)Please take prescribed medications as instructed ?2)Follow up with your PCP in a week ?3)Follow up with your gastroenterologist as an outpatient  ? Increase activity slowly   Complete by: As directed ?  ? ?  ? ?Allergies as of 01/31/2022   ? ?   Reactions  ? Wound Dressing Adhesive Other (See Comments), Rash  ? Pt states that tape and electrodes leave red "scars" on her skin and her skin is very sensitive.  Paper tape ok to use   ? ?  ? ?  ?Medication List  ?  ? ?TAKE these medications   ? ?Accu-Chek Guide Me w/Device Kit ?Use to check blood sugar 4 times daily ?  ?Accu-Chek Guide test strip ?Generic drug: glucose blood ?USE AS DIRECTED 4 TIMES  DAILY ?  ?Accu-Chek Softclix Lancets lancets ?Use as instructed to check blood sugar 4 times daily Dx E11.51 ?  ?amLODipine 10 MG tablet ?Commonly known as: NORVASC ?Take 10 mg by mouth daily. ?  ?aspirin EC 81 MG tablet ?Take 81 mg by mouth at bedtime. ?  ?Centrum Silver 50+Women Tabs ?Take 1 tablet by mouth daily. ?  ?diclofenac Sodium 1 % Gel ?Commonly known as: VOLTAREN ?Apply 1 application. topically daily as needed (leg pain). ?  ?ferrous sulfate 325 (  65 FE) MG EC tablet ?Take 325 mg by mouth daily. ?  ?FreeStyle Libre 2 Sensor Misc ?1 Device by Does not apply route every 14 (fourteen) days. ?  ?gabapentin 300 MG capsule ?Commonly known as: NEURONTIN ?TAKE 1 CAPSULE BY MOUTH THREE TIMES A DAY ?What changed:  ?how much to take ?how to take this ?when to take this ?reasons to  take this ?additional instructions ?  ?lovastatin 20 MG tablet ?Commonly known as: MEVACOR ?Take 20 mg by mouth at bedtime. ?  ?metFORMIN 500 MG 24 hr tablet ?Commonly known as: GLUCOPHAGE-XR ?Take 4 tablets (2,000 mg total) by mouth daily. ?  ?metoprolol succinate 50 MG 24 hr tablet ?Commonly known as: TOPROL-XL ?Take 50 mg by mouth daily. Take with or immediately following a meal. ?What changed: Another medication with the same name was removed. Continue taking this medication, and follow the directions you see here. ?  ?Pancrelipase (Lip-Prot-Amyl) 24000-76000 units Cpep ?Take 1 capsule (24,000 Units total) by mouth 3 (three) times daily before meals. ?  ?pantoprazole 40 MG tablet ?Commonly known as: PROTONIX ?Take 40 mg by mouth daily. ?  ?polyethylene glycol 17 g packet ?Commonly known as: MIRALAX / GLYCOLAX ?Take 17 g by mouth daily as needed. ?  ?ProAir HFA 108 (90 Base) MCG/ACT inhaler ?Generic drug: albuterol ?Inhale 2 puffs into the lungs every 4 (four) hours as needed for wheezing or shortness of breath. Reported on 11/04/2015 ?  ?RELION INSULIN SYR 0.3ML/31G 31G X 5/16" 0.3 ML Misc ?Generic drug: Insulin Syringe-Needle U-100 ?USE 1  IN THE MORNING WITH  INSULIN ?What changed: See the new instructions. ?  ?repaglinide 2 MG tablet ?Commonly known as: PRANDIN ?TAKE 2 TABLETS BY MOUTH  TWICE DAILY BEFORE MEALS ?What changed: See the new instructions. ?  ?Rocklatan 0.02-0.005 % Soln ?Generic drug: Netarsudil-Latanoprost ?Place 1 drop into both eyes daily. ?  ?sertraline 100 MG tablet ?Commonly known as: ZOLOFT ?Take 100 mg by mouth daily. ?  ?temazepam 30 MG capsule ?Commonly known as: RESTORIL ?Take 30 mg by mouth at bedtime as needed for sleep. ?  ?timolol 0.5 % ophthalmic solution ?Commonly known as: TIMOPTIC ?Place 1 drop into both eyes every morning. ?  ? ?  ? ? Follow-up Information   ? ? Carol Ada, MD .   ?Specialty: Family Medicine ?Contact information: ?[place] [address], [address] ?[place] [address] ?[phone] ? ? ?  ?  ? ?  ?  ? ?  ? ?Allergies  ?Allergen Reactions  ? Wound Dressing Adhesive Other (See Comments) and Rash  ?  Pt states that tape and electrodes leave red "scars" on her skin and her skin is very sensitive.  Paper tape ok to use   ? ? ?Consultations: ?GI ? ? ?Procedures/Studies: ?CT ABDOMEN PELVIS W CONTRAST ? ?Result Date: 01/31/2022 ?CLINICAL DATA:  76 year old female with a history of chronic pancreatitis and pancreatic intraepithelial neoplasia-low-grade requiring pylorus-preserving Whipple in 2020. Evidence of acute pancreatitis by CT earlier this month. Admitted with abdominal pain EXAM: CT ABDOMEN AND PELVIS WITH CONTRAST TECHNIQUE: Multidetector CT imaging of the abdomen and pelvis was performed using the standard protocol following bolus administration of intravenous contrast. RADIATION DOSE REDUCTION: This exam was performed according to the departmental dose-optimization program which includes automated exposure control, adjustment of the mA and/or kV according to patient size and/or use of iterative reconstruction technique. CONTRAST:  1109m OMNIPAQUE IOHEXOL 300 MG/ML  SOLN COMPARISON:  CT Abdomen and Pelvis 01/25/2022 and earlier. FINDINGS: Lower  chest: Increased, enhancing bilateral lower lobe atelectasis. No pericardial or pleural effusion. Hepatobiliary: Absent gallbladder and stable pneumobilia. Liver enhancement is stable and within normal limits. Pancreas: Postoperative changes. No pancreatic necrosis identified. Main pancreatic duct appears less dilated today, 3-4 mm now (up to 7 mm previously). Less conspicuous peripancreatic inflammation. No discrete fluid collection. Spleen: Within normal limits. Adrenals/Urinary Tract: Stable and negative. Symmetric renal contrast enhancement and excretion. Stomach/Bowel: Large bowel redundancy and retained stool not significantly changed. Oral contrast has reached the right colon today and the appendix is stable, partially  opacified with contrast and also contains gas. Gastrojejunostomy and postoperative changes from Whipple. Contrast *FIELD* but no abnormally dilated small bowel loops. No free air or free fluid identified

## 2022-01-31 NOTE — Progress Notes (Signed)
Mobility Specialist Progress Note  ? ? 01/31/22 1443  ?Mobility  ?Activity Ambulated with assistance to bathroom  ?Level of Assistance Contact guard assist, steadying assist  ?Assistive Device  ?(HHA)  ?Distance Ambulated (ft) 20 ft  ?Activity Response Tolerated well  ?$Mobility charge 1 Mobility  ? ?Pt received sitting EOB and agreeable to get up to change. No complaints. Left in bed with call bell in reach.  ? ?Hildred Alamin ?Mobility Specialist  ?M.S. 5N: [phone]  ?

## 2022-02-01 NOTE — Progress Notes (Signed)
? ? ? ? ?HPI:FU syncope. Patient is status post right femoral endarterectomy and right fem-pop bypass 2014.  She also has had a left femoral-popliteal bypass in 2017 and angioplasty of the distal anastomosis in 2017. PVD followed by vascular surgery. Seen with syncope previously. Nuclear study January 2022 showed ejection fraction 64%, apical thinning but no ischemia. Monitor 1/22 showed sinus bradycardia/NSR. Echo 12/31/20 showed EF 50-55, mild LVH, restrictive filling.  Admitted with pancreatitis March 2023.  Since last seen, she denies dyspnea, chest pain, palpitations or syncope. ? ?Current Outpatient Medications  ?Medication Sig Dispense Refill  ? amLODipine (NORVASC) 10 MG tablet Take 10 mg by mouth daily.    ? aspirin EC 81 MG tablet Take 81 mg by mouth at bedtime.    ? Continuous Blood Gluc Sensor (FREESTYLE LIBRE 2 SENSOR) MISC 1 Device by Does not apply route every 14 (fourteen) days. 6 each 3  ? diclofenac Sodium (VOLTAREN) 1 % GEL Apply 1 application. topically daily as needed (leg pain).    ? ferrous sulfate 325 (65 FE) MG EC tablet Take 325 mg by mouth daily.    ? gabapentin (NEURONTIN) 300 MG capsule TAKE 1 CAPSULE BY MOUTH THREE TIMES A DAY (Patient taking differently: Take 300 mg by mouth 3 (three) times daily as needed (leg pain).) 90 capsule 6  ? lipase/protease/amylase 24000-76000 units CPEP Take 1 capsule (24,000 Units total) by mouth 3 (three) times daily before meals. (Patient taking differently: Take 24,000 Units by mouth 3 (three) times daily before meals. Take 1 tablet before meals and 1 tablet after meals and 1 tablet with a snack) 270 capsule 0  ? lovastatin (MEVACOR) 20 MG tablet Take 20 mg by mouth at bedtime.    ? metFORMIN (GLUCOPHAGE-XR) 500 MG 24 hr tablet Take 4 tablets (2,000 mg total) by mouth daily. 360 tablet 3  ? metoprolol succinate (TOPROL-XL) 50 MG 24 hr tablet Take 50 mg by mouth daily. Take with or immediately following a meal.    ? Multiple Vitamins-Minerals (CENTRUM  SILVER 50+WOMEN) TABS Take 1 tablet by mouth daily.    ? Netarsudil-Latanoprost (ROCKLATAN) 0.02-0.005 % SOLN Place 1 drop into both eyes daily.    ? pantoprazole (PROTONIX) 40 MG tablet Take 40 mg by mouth daily.    ? polyethylene glycol (MIRALAX / GLYCOLAX) 17 g packet Take 17 g by mouth daily as needed. 14 each 0  ? RELION INSULIN SYR 0.3ML/31G 31G X 5/16" 0.3 ML MISC USE 1  IN THE MORNING WITH  INSULIN 100 each 0  ? repaglinide (PRANDIN) 2 MG tablet TAKE 2 TABLETS BY MOUTH  TWICE DAILY BEFORE MEALS (Patient taking differently: Take 2 mg by mouth 2 (two) times daily before a meal.) 360 tablet 3  ? sertraline (ZOLOFT) 100 MG tablet Take 100 mg by mouth daily.    ? temazepam (RESTORIL) 30 MG capsule Take 30 mg by mouth at bedtime as needed for sleep.  0  ? timolol (TIMOPTIC) 0.5 % ophthalmic solution Place 1 drop into both eyes every morning.    ? ?No current facility-administered medications for this visit.  ? ? ? ?Past Medical History:  ?Diagnosis Date  ? Bronchitis   ? Bulging disc   ? in neck  ? Depression with anxiety   ? Diabetes mellitus   ? Type II  ? Fibromyalgia   ? pt. denies  ? GERD (gastroesophageal reflux disease)   ? Glaucoma   ? History of bronchitis   ? Hyperlipidemia   ?  Hypertension   ? Overactive bladder   ? Peripheral vascular disease ([place])   ? Pneumonia   ? Sleep apnea   ? recent test negative for sleep apnea  ? Tobacco abuse   ? Urinary frequency   ? ? ?Past Surgical History:  ?Procedure Laterality Date  ? ABDOMINAL AORTAGRAM N/A 08/15/2013  ? Procedure: ABDOMINAL AORTAGRAM;  Surgeon: Elam Dutch, MD;  Location: [facility] CATH LAB;  Service: Cardiovascular;  Laterality: N/A;  ? ABDOMINAL AORTAGRAM N/A 06/19/2014  ? Procedure: ABDOMINAL AORTAGRAM;  Surgeon: Elam Dutch, MD;  [facility] C[facility] LAB;  Service: Cardiovascular;  Laterality: N/A;  ? ABDOMINAL AORTOGRAM W/LOWER EXTREMITY N/A 05/10/2018  ? Procedure: ABDOMINAL AORTOGRAM W/LOWER EXTREMITY;  Surgeon: Elam Dutch, MD;  Location: [place] CV LAB;  Service: Cardiovascular;  Laterality: N/A;  bilateral  ? Aortogram w/ PTA  05/14/08,  11-04-10  ? Bilateral aortogram w/ bilateral  SFA PTA  stenting   ? BREAST SURGERY Right   ? boil removal  ? BUNIONECTOMY    ? L foot in the 1980s  ? COLONOSCOPY    ? 2014  ? DILATION AND CURETTAGE OF UTERUS    ? X4  ? ENDARTERECTOMY FEMORAL Right 06/03/2018  ? Procedure: RIGHT FEMORAL ENDARTERECTOMY;  Surgeon: Elam Dutch, MD;  Location: [facility] OR;  Service: Vascular;  Laterality: Right;  ? Epidural shots in neck     ? ESOPHAGOGASTRODUODENOSCOPY (EGD) WITH PROPOFOL N/A 09/15/2019  ? Procedure: ESOPHAGOGASTRODUODENOSCOPY (EGD) WITH PROPOFOL;  Surgeon: Arta Silence, MD;  Location: [facility] ENDOSCOPY;  Service: Endoscopy;  Laterality: N/A;  ? EUS N/A 09/15/2019  ? Procedure: UPPER ENDOSCOPIC ULTRASOUND (EUS) LINEAR;  Surgeon: Arta Silence, MD;  Location: [facility] ENDOSCOPY;  Service: Endoscopy;  Laterality: N/A;  ? EYE SURGERY Bilateral 2016  ? cataract removal  ? FEMORAL-POPLITEAL BYPASS GRAFT Left 10/21/2013  ? Procedure: LEFT FEMORAL-POPLITEAL ARTERY BYPASS WITH SAPHENOUS VEIN GRAFT , POPLITEAL ENDARTERECTOMY ,INTRAOPERATIVE ARTERIOGRAM, vein patch angioplasty to popliteal artery;  Surgeon: Elam Dutch, MD;  Location: [facility] OR;  Service: Vascular;  Laterality: Left;  ? FEMORAL-POPLITEAL BYPASS GRAFT Right 02/08/2016  ? Procedure: Right FEMORAL- to Above Knee POPLITEAL ARTERY Bypass Graft with reversed saphenous vein and Common Femoral Endarterectomy  with profundoplasty;  Surgeon: Elam Dutch, MD;  Location: [place];  Service: Vascular;  Laterality: Right;  ? FINE NEEDLE ASPIRATION N/A 09/15/2019  ? Procedure: FINE NEEDLE ASPIRATION (FNA) LINEAR;  Surgeon: Arta Silence, MD;  Location: [facility] ENDOS[facility];  Service: Endoscopy;  Laterality: N/A;  ? GROIN DEBRIDEMENT Left 10/28/2013  ? Procedure: left inner thigh DEBRIDEMENT;  Surgeon: Elam Dutch, MD;  Location: [place];  Service: Vascular;  Laterality: Left;  ? HAND  SURGERY Left 2010  ? LOWER EXTREMITY ANGIOGRAM Bilateral 12/24/2015  ? Procedure: Lower Extremity Angiogram;  Surgeon: Elam Dutch, MD;  Location: [place] CV LAB;  Service: Cardiovascular;  Laterality: Bilateral;  ? PATCH ANGIOPLASTY Right 06/03/2018  ? Procedure: PATCH ANGIOPLASTY USING A XENOSURE 1CM X 14CM BIOLOGIC PATCH;  Surgeon: Elam Dutch, MD;  Location: [facility] OR;  Service: Vascular;  Laterality: Right;  ? PERIPHERAL VASCULAR CATHETERIZATION N/A 12/24/2015  ? Procedure: Abdominal Aortogram;  Surgeon: Elam Dutch, MD;  Location: [place] CV LAB;  Service: Cardiovascular;  Laterality: N/A;  ? PERIPHERAL VASCULAR CATHETERIZATION Left 12/24/2015  ? Procedure: Peripheral Vascular Balloon Angioplasty;  Surgeon: Elam Dutch, MD;  Location: [place] CV LAB;  Service: Cardiovascular;  Laterality: Left;  drug coated balloon  ?  PERIPHERAL VASCULAR CATHETERIZATION N/A 06/30/2016  ? Procedure: Abdominal Aortogram;  Surgeon: Elam Dutch, MD;  Location: [place] CV LAB;  Service: Cardiovascular;  Laterality: N/A;  ? PERIPHERAL VASCULAR CATHETERIZATION Bilateral 06/30/2016  ? Procedure: Lower Extremity Angiography;  Surgeon: Elam Dutch, MD;  Location: [place] CV LAB;  Service: Cardiovascular;  Laterality: Bilateral;  ? TONSILLECTOMY    ? ? ?Social History  ? ?Socioeconomic History  ? Marital status: Single  ?  Spouse name: Not on file  ? Number of children: 1  ? Years of education: Not on file  ? Highest education level: Not on file  ?Occupational History  ? Occupation: Retired  ?  Employer: [employer]  ?Tobacco Use  ? Smoking status: Former  ?  Packs/day: 0.10  ?  Types: Cigarettes  ?  Quit date: 10/2014  ?  Years since quitting: 7.3  ?  Passive exposure: Never  ? Smokeless tobacco: Never  ? Tobacco comments:  ?  pt states that she is using the E cig only   ?Vaping Use  ? Vaping Use: Some days  ?Substance and Sexual Activity  ? Alcohol use: Yes  ?  Alcohol/week: 0.0 - 1.0 standard drinks  ?   Comment: Occasional; 10-22 rarely   ? Drug use: No  ? Sexual activity: Not on file  ?Other Topics Concern  ? Not on file  ?Social History Narrative  ? Drinks about 2 cups of coffee a day, and some tea

## 2022-02-07 DIAGNOSIS — K8689 Other specified diseases of pancreas: Secondary | ICD-10-CM | POA: Diagnosis not present

## 2022-02-07 DIAGNOSIS — K859 Acute pancreatitis without necrosis or infection, unspecified: Secondary | ICD-10-CM | POA: Diagnosis not present

## 2022-02-08 ENCOUNTER — Ambulatory Visit (INDEPENDENT_AMBULATORY_CARE_PROVIDER_SITE_OTHER): Payer: No Typology Code available for payment source | Admitting: Cardiology

## 2022-02-08 ENCOUNTER — Other Ambulatory Visit: Payer: Self-pay

## 2022-02-08 ENCOUNTER — Encounter: Payer: Self-pay | Admitting: Cardiology

## 2022-02-08 VITALS — BP 130/76 | HR 66 | Ht 63.0 in | Wt 115.0 lb

## 2022-02-08 DIAGNOSIS — R55 Syncope and collapse: Secondary | ICD-10-CM | POA: Diagnosis not present

## 2022-02-08 DIAGNOSIS — I739 Peripheral vascular disease, unspecified: Secondary | ICD-10-CM

## 2022-02-08 DIAGNOSIS — I10 Essential (primary) hypertension: Secondary | ICD-10-CM | POA: Diagnosis not present

## 2022-02-08 DIAGNOSIS — E7800 Pure hypercholesterolemia, unspecified: Secondary | ICD-10-CM | POA: Diagnosis not present

## 2022-02-08 NOTE — Patient Instructions (Signed)
?  Lab Work: ? ?Your physician recommends that you have lab work today- 2nd floor[address] ? ?If you have labs (blood work) drawn today and your tests are completely normal, you will receive your results only by: ?MyChart Message (if you have MyChart) OR ?A paper copy in the mail ?If you have any lab test that is abnormal or we need to change your treatment, we will call you to review the results. ? ? ?Follow-Up: ?At [facility], you and your health needs are our priority.  As part of our continuing mission to provide you with exceptional heart care, we have created designated Provider Care Teams.  These Care Teams include your primary Cardiologist (physician) and Advanced Practice Providers (APPs -  Physician Assistants and Nurse Practitioners) who all work together to provide you with the care you need, when you need it. ? ?We recommend signing up for the patient portal called "MyChart".  Sign up information is provided on this After Visit Summary.  MyChart is used to connect with patients for Virtual Visits (Telemedicine).  Patients are able to view lab/test results, encounter notes, upcoming appointments, etc.  Non-urgent messages can be sent to your provider as well.   ?To learn more about what you can do with MyChart, go to [email or web address].   ? ?Your next appointment:   ?12 month(s) ? ?The format for your next appointment:   ?In Person ? ?Provider:   ?Kirk Ruths, MD  ? ? ? ?

## 2022-02-09 DIAGNOSIS — J449 Chronic obstructive pulmonary disease, unspecified: Secondary | ICD-10-CM | POA: Diagnosis not present

## 2022-02-09 DIAGNOSIS — G4700 Insomnia, unspecified: Secondary | ICD-10-CM | POA: Diagnosis not present

## 2022-02-09 DIAGNOSIS — E1169 Type 2 diabetes mellitus with other specified complication: Secondary | ICD-10-CM | POA: Diagnosis not present

## 2022-02-09 DIAGNOSIS — K219 Gastro-esophageal reflux disease without esophagitis: Secondary | ICD-10-CM | POA: Diagnosis not present

## 2022-02-09 DIAGNOSIS — I10 Essential (primary) hypertension: Secondary | ICD-10-CM | POA: Diagnosis not present

## 2022-02-09 DIAGNOSIS — I739 Peripheral vascular disease, unspecified: Secondary | ICD-10-CM | POA: Diagnosis not present

## 2022-02-09 DIAGNOSIS — F419 Anxiety disorder, unspecified: Secondary | ICD-10-CM | POA: Diagnosis not present

## 2022-02-09 DIAGNOSIS — Z09 Encounter for follow-up examination after completed treatment for conditions other than malignant neoplasm: Secondary | ICD-10-CM | POA: Diagnosis not present

## 2022-02-09 DIAGNOSIS — R69 Illness, unspecified: Secondary | ICD-10-CM | POA: Diagnosis not present

## 2022-02-09 DIAGNOSIS — K8501 Idiopathic acute pancreatitis with uninfected necrosis: Secondary | ICD-10-CM | POA: Diagnosis not present

## 2022-02-09 LAB — LIPID PANEL
Chol/HDL Ratio: 1.6 ratio (ref 0.0–4.4)
Cholesterol, Total: 99 mg/dL — ABNORMAL LOW (ref 100–199)
HDL: 62 mg/dL (ref >39)
LDL Chol Calc (NIH): 21 mg/dL (ref 0–99)
Triglycerides: 77 mg/dL (ref 0–149)
VLDL Cholesterol Cal: 16 mg/dL (ref 5–40)

## 2022-02-10 ENCOUNTER — Other Ambulatory Visit: Payer: Self-pay | Admitting: Family Medicine

## 2022-02-10 DIAGNOSIS — Z1231 Encounter for screening mammogram for malignant neoplasm of breast: Secondary | ICD-10-CM

## 2022-02-14 DIAGNOSIS — R69 Illness, unspecified: Secondary | ICD-10-CM | POA: Diagnosis not present

## 2022-02-15 ENCOUNTER — Ambulatory Visit
Admission: RE | Admit: 2022-02-15 | Discharge: 2022-02-15 | Disposition: A | Discharge status: Home / Self Care | Payer: No Typology Code available for payment source | Source: Ambulatory Visit | Attending: Family Medicine | Admitting: Family Medicine

## 2022-02-15 DIAGNOSIS — Z1231 Encounter for screening mammogram for malignant neoplasm of breast: Secondary | ICD-10-CM | POA: Diagnosis not present

## 2022-02-20 DIAGNOSIS — H53433 Sector or arcuate defects, bilateral: Secondary | ICD-10-CM | POA: Diagnosis not present

## 2022-02-20 DIAGNOSIS — H35033 Hypertensive retinopathy, bilateral: Secondary | ICD-10-CM | POA: Diagnosis not present

## 2022-02-20 DIAGNOSIS — E119 Type 2 diabetes mellitus without complications: Secondary | ICD-10-CM | POA: Diagnosis not present

## 2022-02-20 DIAGNOSIS — H401133 Primary open-angle glaucoma, bilateral, severe stage: Secondary | ICD-10-CM | POA: Diagnosis not present

## 2022-02-23 DIAGNOSIS — M65342 Trigger finger, left ring finger: Secondary | ICD-10-CM | POA: Diagnosis not present

## 2022-02-23 DIAGNOSIS — M65332 Trigger finger, left middle finger: Secondary | ICD-10-CM | POA: Diagnosis not present

## 2022-03-03 ENCOUNTER — Other Ambulatory Visit: Payer: Self-pay | Admitting: *Deleted

## 2022-03-03 DIAGNOSIS — I739 Peripheral vascular disease, unspecified: Secondary | ICD-10-CM

## 2022-03-08 ENCOUNTER — Encounter: Payer: Self-pay | Admitting: Vascular Surgery

## 2022-03-08 ENCOUNTER — Ambulatory Visit (INDEPENDENT_AMBULATORY_CARE_PROVIDER_SITE_OTHER): Payer: No Typology Code available for payment source | Admitting: Vascular Surgery

## 2022-03-08 ENCOUNTER — Ambulatory Visit (HOSPITAL_COMMUNITY)
Admission: RE | Admit: 2022-03-08 | Discharge: 2022-03-08 | Disposition: A | Discharge status: Home / Self Care | Payer: No Typology Code available for payment source | Source: Ambulatory Visit | Attending: Vascular Surgery | Admitting: Vascular Surgery

## 2022-03-08 ENCOUNTER — Ambulatory Visit (INDEPENDENT_AMBULATORY_CARE_PROVIDER_SITE_OTHER)
Admission: RE | Admit: 2022-03-08 | Discharge: 2022-03-08 | Disposition: A | Discharge status: Home / Self Care | Payer: No Typology Code available for payment source | Source: Ambulatory Visit | Attending: Vascular Surgery | Admitting: Vascular Surgery

## 2022-03-08 VITALS — BP 143/89 | HR 57 | Temp 98.0°F | Resp 20 | Ht 63.0 in | Wt 114.0 lb

## 2022-03-08 DIAGNOSIS — I739 Peripheral vascular disease, unspecified: Secondary | ICD-10-CM | POA: Diagnosis present

## 2022-03-08 NOTE — Progress Notes (Signed)
? ?Patient ID: Jill Higgins, female   DOB: 14-Apr-1946, 76 y.o.   MRN: [id number] ? ?Reason for Consult: Follow-up ?  ?Referred by Carol Ada, MD ? ?Subjective:  ?   ?HPI: ? ?Jill Higgins is a 76 y.o. female with a previous history of bilateral lower extremity bypasses the right of which was initially performed in 2014 and she also required revision more recently.  She also had angioplasty of the distal anastomosis of the left in 2017.  She underwent Whipple procedure in 2020 and has recovered well from that surgery with minimal residual abdominal pain.  She currently walks without limitations.  She denies any rest pain or tissue loss.  She does not have any personal history of aneurysm but possibly has a distant relative with history of aortic aneurysm.  She denies any stroke, TIA or amaurosis.  She is a former smoker.  She continues on aspirin and statin. ? ?Past Medical History:  ?Diagnosis Date  ? Bronchitis   ? Bulging disc   ? in neck  ? Depression with anxiety   ? Diabetes mellitus   ? Type II  ? Fibromyalgia   ? pt. denies  ? GERD (gastroesophageal reflux disease)   ? Glaucoma   ? History of bronchitis   ? Hyperlipidemia   ? Hypertension   ? Overactive bladder   ? Peripheral vascular disease ([place])   ? Pneumonia   ? Sleep apnea   ? recent test negative for sleep apnea  ? Tobacco abuse   ? Urinary frequency   ? ?Family History  ?Problem Relation Age of Onset  ? Heart disease Father   ? Heart attack Father   ?     MI at age 16  ? Hypertension Mother   ? Alzheimer's disease Mother   ? Hypertension Sister   ? Diabetes Brother   ? Hyperlipidemia Brother   ? Hypertension Brother   ? Heart disease Brother   ? Heart attack Brother   ? Breast cancer Maternal Aunt   ? ?Past Surgical History:  ?Procedure Laterality Date  ? ABDOMINAL AORTAGRAM N/A 08/15/2013  ? Procedure: ABDOMINAL AORTAGRAM;  Surgeon: Elam Dutch, MD;  Location: [facility] CATH LAB;  Service: Cardiovascular;  Laterality: N/A;  ? ABDOMINAL AORTAGRAM  N/A 06/19/2014  ? Procedure: ABDOMINAL AORTAGRAM;  Surgeon: Elam Dutch, MD;  Location: [facility] CATH LAB;  Service: Cardiovascular;  Laterality: N/A;  ? ABDOMINAL AORTOGRAM W/LOWER EXTREMITY N/A 05/10/2018  ? Procedure: ABDOMINAL AORTOGRAM W/LOWER EXTREMITY;  Surgeon: Elam Dutch, MD;  Location: [place] CV LAB;  Service: Cardiovascular;  Laterality: N/A;  bilateral  ? Aortogram w/ PTA  05/14/08,  11-04-10  ? Bilateral aortogram w/ bilateral  SFA PTA  stenting   ? BREAST SURGERY Right   ? boil removal  ? BUNIONECTOMY    ? L foot in the 1980s  ? COLONOSCOPY    ? 2014  ? DILATION AND CURETTAGE OF UTERUS    ? X4  ? ENDARTERECTOMY FEMORAL Right 06/03/2018  ? Procedure: RIGHT FEMORAL ENDARTERECTOMY;  Surgeon: Elam Dutch, MD;  Location: [facility] OR;  Service: Vascular;  Laterality: Right;  ? Epidural shots in neck     ? ESOPHAGOGASTRODUODENOSCOPY (EGD) WITH PROPOFOL N/A 09/15/2019  ? Procedure: ESOPHAGOGASTRODUODENOSCOPY (EGD) WITH PROPOFOL;  Surgeon: Arta Silence, MD;  Location: [facility] ENDOSCOPY;  Service: Endoscopy;  Laterality: N/A;  ? EUS N/A 09/15/2019  ? Procedure: UPPER ENDOSCOPIC ULTRASOUND (EUS) LINEAR;  Surgeon: Arta Silence, MD;  Location:  [facility] ENDOS[facility];  Service: Endoscopy;  Laterality: N/A;  ? EYE SURGERY Bilateral 2016  ? cataract removal  ? FEMORAL-POPLITEAL BYPASS GRAFT Left 10/21/2013  ? Procedure: LEFT FEMORAL-POPLITEAL ARTERY BYPASS WITH SAPHENOUS VEIN GRAFT , POPLITEAL ENDARTERECTOMY ,INTRAOPERATIVE ARTERIOGRAM, vein patch angioplasty to popliteal artery;  Surgeon: Elam Dutch, MD;  Location: [facility] OR;  Service: Vascular;  Laterality: Left;  ? FEMORAL-POPLITEAL BYPASS GRAFT Right 02/08/2016  ? Procedure: Right FEMORAL- to Above Knee POPLITEAL ARTERY Bypass Graft with reversed saphenous vein and Common Femoral Endarterectomy  with profundoplasty;  Surgeon: Elam Dutch, MD;  Location: [place];  Service: Vascular;  Laterality: Right;  ? FINE NEEDLE ASPIRATION N/A 09/15/2019  ? Procedure: FINE  NEEDLE ASPIRATION (FNA) LINEAR;  Surgeon: Arta Silence, MD;  Location: [facility]OS[facility];  Service: Endoscopy;  Laterality: N/A;  ? GROIN DEBRIDEMENT Left 10/28/2013  ? Procedure: left inner thigh DEBRIDEMENT;  Surgeon: Elam Dutch, MD;  Location: [place];  Service: Vascular;  Laterality: Left;  ? HAND SURGERY Left 2010  ? LOWER EXTREMITY ANGIOGRAM Bilateral 12/24/2015  ? Procedure: Lower Extremity Angiogram;  Surgeon: Elam Dutch, MD;  Location: [place] CV LAB;  Service: Cardiovascular;  Laterality: Bilateral;  ? PATCH ANGIOPLASTY Right 06/03/2018  ? Procedure: PATCH ANGIOPLASTY USING A XENOSURE 1CM X 14CM BIOLOGIC PATCH;  Surgeon: Elam Dutch, MD;  Location: [facility] OR;  Service: Vascular;  Laterality: Right;  ? PERIPHERAL VASCULAR CATHETERIZATION N/A 12/24/2015  ? Procedure: Abdominal Aortogram;  Surgeon: Elam Dutch, MD;  Location: [place] CV LAB;  Service: Cardiovascular;  Laterality: N/A;  ? PERIPHERAL VASCULAR CATHETERIZATION Left 12/24/2015  ? Procedure: Peripheral Vascular Balloon Angioplasty;  Surgeon: Elam Dutch, MD;  Location: [place] CV LAB;  Service: Cardiovascular;  Laterality: Left;  drug coated balloon  ? PERIPHERAL VASCULAR CATHETERIZATION N/A 06/30/2016  ? Procedure: Abdominal Aortogram;  Surgeon: Elam Dutch, MD;  Location: [place] CV LAB;  Service: Cardiovascular;  Laterality: N/A;  ? PERIPHERAL VASCULAR CATHETERIZATION Bilateral 06/30/2016  ? Procedure: Lower Extremity Angiography;  Surgeon: Elam Dutch, MD;  Location: [place] CV LAB;  Service: Cardiovascular;  Laterality: Bilateral;  ? TONSILLECTOMY    ? ? ?Short Social History:  ?Social History  ? ?Tobacco Use  ? Smoking status: Former  ?  Packs/day: 0.10  ?  Types: Cigarettes  ?  Quit date: 10/2014  ?  Years since quitting: 7.3  ?  Passive exposure: Never  ? Smokeless tobacco: Never  ? Tobacco comments:  ?  pt states that she is using the E cig only   ?Substance Use Topics  ? Alcohol use: Yes  ?   Alcohol/week: 0.0 - 1.0 standard drinks  ?  Comment: Occasional; 10-22 rarely   ? ? ?Allergies  ?Allergen Reactions  ? Wound Dressing Adhesive Other (See Comments) and Rash  ?  Pt states that tape and electrodes leave red "scars" on her skin and her skin is very sensitive.  Paper tape ok to use   ? ? ?Current Outpatient Medications  ?Medication Sig Dispense Refill  ? amLODipine (NORVASC) 10 MG tablet Take 10 mg by mouth daily.    ? aspirin EC 81 MG tablet Take 81 mg by mouth at bedtime.    ? Continuous Blood Gluc Sensor (FREESTYLE LIBRE 2 SENSOR) MISC 1 Device by Does not apply route every 14 (fourteen) days. 6 each 3  ? diclofenac Sodium (VOLTAREN) 1 % GEL Apply 1 application. topically daily as needed (leg pain).    ?  ferrous sulfate 325 (65 FE) MG EC tablet Take 325 mg by mouth daily.    ? gabapentin (NEURONTIN) 600 MG tablet Take 600 mg by mouth 3 (three) times daily.    ? lipase/protease/amylase 24000-76000 units CPEP Take 1 capsule (24,000 Units total) by mouth 3 (three) times daily before meals. (Patient taking differently: Take 24,000 Units by mouth 3 (three) times daily before meals. Take 1 tablet before meals and 1 tablet after meals and 1 tablet with a snack) 270 capsule 0  ? lovastatin (MEVACOR) 20 MG tablet Take 20 mg by mouth at bedtime.    ? metFORMIN (GLUCOPHAGE-XR) 500 MG 24 hr tablet Take 4 tablets (2,000 mg total) by mouth daily. 360 tablet 3  ? metoprolol succinate (TOPROL-XL) 50 MG 24 hr tablet Take 50 mg by mouth daily. Take with or immediately following a meal.    ? Multiple Vitamins-Minerals (CENTRUM SILVER 50+WOMEN) TABS Take 1 tablet by mouth daily.    ? Netarsudil-Latanoprost (ROCKLATAN) 0.02-0.005 % SOLN Place 1 drop into both eyes daily.    ? pantoprazole (PROTONIX) 40 MG tablet Take 40 mg by mouth daily.    ? polyethylene glycol (MIRALAX / GLYCOLAX) 17 g packet Take 17 g by mouth daily as needed. 14 each 0  ? RELION INSULIN SYR 0.3ML/31G 31G X 5/16" 0.3 ML MISC USE 1  IN THE MORNING  WITH  INSULIN 100 each 0  ? repaglinide (PRANDIN) 2 MG tablet TAKE 2 TABLETS BY MOUTH  TWICE DAILY BEFORE MEALS (Patient taking differently: Take 2 mg by mouth 2 (two) times daily before a meal.) 360 tablet 3  ?

## 2022-03-09 DIAGNOSIS — M65332 Trigger finger, left middle finger: Secondary | ICD-10-CM | POA: Diagnosis not present

## 2022-03-09 DIAGNOSIS — M65342 Trigger finger, left ring finger: Secondary | ICD-10-CM | POA: Diagnosis not present

## 2022-03-09 DIAGNOSIS — G8918 Other acute postprocedural pain: Secondary | ICD-10-CM | POA: Diagnosis not present

## 2022-03-21 DIAGNOSIS — M79642 Pain in left hand: Secondary | ICD-10-CM | POA: Diagnosis not present

## 2022-03-23 DIAGNOSIS — K861 Other chronic pancreatitis: Secondary | ICD-10-CM | POA: Diagnosis not present

## 2022-03-23 DIAGNOSIS — R1084 Generalized abdominal pain: Secondary | ICD-10-CM | POA: Diagnosis not present

## 2022-03-23 DIAGNOSIS — M79642 Pain in left hand: Secondary | ICD-10-CM | POA: Diagnosis not present

## 2022-03-28 ENCOUNTER — Ambulatory Visit: Payer: No Typology Code available for payment source | Admitting: Endocrinology

## 2022-03-29 DIAGNOSIS — Z961 Presence of intraocular lens: Secondary | ICD-10-CM | POA: Diagnosis not present

## 2022-03-29 DIAGNOSIS — H401133 Primary open-angle glaucoma, bilateral, severe stage: Secondary | ICD-10-CM | POA: Diagnosis not present

## 2022-03-29 DIAGNOSIS — E119 Type 2 diabetes mellitus without complications: Secondary | ICD-10-CM | POA: Diagnosis not present

## 2022-04-19 DIAGNOSIS — E7800 Pure hypercholesterolemia, unspecified: Secondary | ICD-10-CM | POA: Diagnosis not present

## 2022-04-19 DIAGNOSIS — J449 Chronic obstructive pulmonary disease, unspecified: Secondary | ICD-10-CM | POA: Diagnosis not present

## 2022-04-19 DIAGNOSIS — I10 Essential (primary) hypertension: Secondary | ICD-10-CM | POA: Diagnosis not present

## 2022-04-19 DIAGNOSIS — K219 Gastro-esophageal reflux disease without esophagitis: Secondary | ICD-10-CM | POA: Diagnosis not present

## 2022-04-19 DIAGNOSIS — F419 Anxiety disorder, unspecified: Secondary | ICD-10-CM | POA: Diagnosis not present

## 2022-04-19 DIAGNOSIS — I739 Peripheral vascular disease, unspecified: Secondary | ICD-10-CM | POA: Diagnosis not present

## 2022-04-20 ENCOUNTER — Ambulatory Visit (INDEPENDENT_AMBULATORY_CARE_PROVIDER_SITE_OTHER): Payer: No Typology Code available for payment source | Admitting: Internal Medicine

## 2022-04-20 ENCOUNTER — Encounter: Payer: Self-pay | Admitting: Internal Medicine

## 2022-04-20 VITALS — BP 120/60 | HR 64 | Ht 63.0 in | Wt 120.6 lb

## 2022-04-20 DIAGNOSIS — E1151 Type 2 diabetes mellitus with diabetic peripheral angiopathy without gangrene: Secondary | ICD-10-CM

## 2022-04-20 LAB — POCT GLYCOSYLATED HEMOGLOBIN (HGB A1C): Hemoglobin A1C: 7 % — AB (ref 4.0–5.6)

## 2022-04-20 MED ORDER — INSULIN PEN NEEDLE 32G X 4 MM MISC: 3 refills | Status: DC

## 2022-04-20 MED ORDER — NOVOLOG FLEXPEN 100 UNIT/ML ~~LOC~~ SOPN: 4.0000 [IU] | PEN_INJECTOR | Freq: Three times a day (TID) | SUBCUTANEOUS | 11 refills | Status: DC

## 2022-04-20 NOTE — Progress Notes (Unsigned)
Patient ID: Jill Higgins, female   DOB: [birth year], 76 y.o.   MRN: [id number]  HPI: Jill Higgins is a 76 y.o.-year-old female, returning for follow-up for DM2, dx in 2013, non-insulin-dependent (but previously on insulin in 2021), uncontrolled, with complications (CAD, PAD). Pt. previously saw Dr. Loanne Drilling, last visit 3 months ago.  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  Reviewed HbA1c: Lab Results  Component Value Date   HGBA1C 6.3 (A) 01/23/2022   HGBA1C 6.8 (A) 10/24/2021   HGBA1C 7.5 (A) 07/18/2021   HGBA1C 7.9 (A) 02/01/2021   HGBA1C 8.1 (A) 12/03/2020   HGBA1C 9.5 (A) 11/02/2020   HGBA1C 8.3 (A) 08/10/2020   HGBA1C 8.5 05/13/2020   HGBA1C 8.5 (A) 03/30/2020   HGBA1C 7.8 (A) 12/24/2019   Pt is on a regimen of: - Metformin ER 1000 mg 2x a day, with meals - Repaglinide 4 mg  2x a day She was previously on insulin  01/2022. She had previous hypoglycemia with glipizide.  Pt checks her sugars >4x a day with the Libre 2 CGM:   Lowest blood sugars: 54 - pm; she has hypoglycemia awareness at 70.  Highest sugar was 300s (hand surgery).  Glucometer: Accu-Chek guide  Pt's meals - 2x a day.  - no CKD, last BUN/creatinine:  Lab Results  Component Value Date   BUN <5 (L) 01/31/2022   BUN <5 (L) 01/29/2022   CREATININE 0.70 01/31/2022   CREATININE 0.63 01/29/2022  She is not on ACE inhibitor/ARB.  -+ HL; last set of lipids: Lab Results  Component Value Date   CHOL 99 (L) 02/08/2022   HDL 62 02/08/2022   LDLCALC 21 02/08/2022   TRIG 77 02/08/2022   CHOLHDL 1.6 02/08/2022  On Mevacor 10 mg daily  - last eye exam was in 03/2022. No DR reportedly. + Glaucoma. Dr. Sherral Hammers.  - + numbness and tingling in her feet.  Last foot exam July 18, 2021.  On Gabapentin.  She has a history of HTN, overactive bladder,  depression/anxiety, anemia-on iron 325 mg daily  ROS: + see HPI +increased urination, no blurry vision, no nausea, chest pain.  Past Medical History:  Diagnosis Date   Bronchitis    Bulging disc    in neck   Depression with anxiety    Diabetes mellitus    Type II   Fibromyalgia    pt. denies   GERD (gastroesophageal reflux disease)    Glaucoma    History of bronchitis    Hyperlipidemia    Hypertension    Overactive bladder    Peripheral vascular disease (HCC)    Pneumonia    Sleep apnea    recent test negative for sleep apnea   Tobacco abuse    Urinary frequency    Past Surgical History:  Procedure Laterality Date   ABDOMINAL AORTAGRAM N/A 08/15/2013   Procedure: ABDOMINAL Maxcine Ham;  Surgeon: Elam Dutch, MD;  Location: [facility] CATH LAB;  Service: Cardiovascular;  Laterality: N/A;   ABDOMINAL AORTAGRAM N/A 06/19/2014   Procedure: ABDOMINAL Maxcine Ham;  Surgeon: Elam Dutch, MD;  Location: [facility] CATH LAB;  Service: Cardiovascular;  Laterality: N/A;   ABDOMINAL AORTOGRAM W/LOWER EXTREMITY N/A 05/10/2018   Procedure: ABDOMINAL AORTOGRAM W/LOWER EXTREMITY;  Surgeon: Elam Dutch, MD;  Location: [place] CV LAB;  Service: Cardiovascular;  Laterality: N/A;  bilateral  Aortogram w/ PTA  05/14/08,  11-04-10   Bilateral aortogram w/ bilateral  SFA PTA  stenting    BREAST SURGERY Right    boil removal   BUNIONECTOMY     L foot in the 1980s   COLONOSCOPY     2014   DILATION AND CURETTAGE OF UTERUS     X4   ENDARTERECTOMY FEMORAL Right 06/03/2018   Procedure: RIGHT FEMORAL ENDARTERECTOMY;  Surgeon: Elam Dutch, MD;  Location: [place];  Service: Vascular;  Laterality: Right;   Epidural shots in neck      ESOPHAGOGASTRODUODENOSCOPY (EGD) WITH PROPOFOL N/A 09/15/2019   Procedure: ESOPHAGOGASTRODUODENOSCOPY (EGD) WITH PROPOFOL;  Surgeon: Arta Silence, MD;  Location: [facility] ENDOSCOPY;  Service: Endoscopy;  Laterality: N/A;   EUS N/A 09/15/2019   Procedure: UPPER  ENDOSCOPIC ULTRASOUND (EUS) LINEAR;  Surgeon: Arta Silence, MD;  Location: [facility] ENDOSCOPY;  Service: Endoscopy;  Laterality: N/A;   EYE SURGERY Bilateral 2016   cataract removal   FEMORAL-POPLITEAL BYPASS GRAFT Left 10/21/2013   Procedure: LEFT FEMORAL-POPLITEAL ARTERY BYPASS WITH SAPHENOUS VEIN GRAFT , POPLITEAL ENDARTERECTOMY ,INTRAOPERATIVE ARTERIOGRAM, vein patch angioplasty to popliteal artery;  Surgeon: Elam Dutch, MD;  Location: [facility] OR;  Service: Vascular;  Laterality: Left;   FEMORAL-POPLITEAL BYPASS GRAFT Right 02/08/2016   Procedure: Right FEMORAL- to Above Knee POPLITEAL ARTERY Bypass Graft with reversed saphenous vein and Common Femoral Endarterectomy  with profundoplasty;  Surgeon: Elam Dutch, MD;  Location: [place];  Service: Vascular;  Laterality: Right;   FINE NEEDLE ASPIRATION N/A 09/15/2019   Procedure: FINE NEEDLE ASPIRATION (FNA) LINEAR;  Surgeon: Arta Silence, MD;  Location: [facility] ENDOSCOPY;  Service: Endoscopy;  Laterality: N/A;   GROIN DEBRIDEMENT Left 10/28/2013   Procedure: left inner thigh DEBRIDEMENT;  Surgeon: Elam Dutch, MD;  Location: [facility] OR;  Service: Vascular;  Laterality: Left;   HAND SURGERY Left 2010   LOWER EXTREMITY ANGIOGRAM Bilateral 12/24/2015   Procedure: Lower Extremity Angiogram;  Surgeon: Elam Dutch, MD;  Location: [place] CV LAB;  Service: Cardiovascular;  Laterality: Bilateral;   PATCH ANGIOPLASTY Right 06/03/2018   Procedure: PATCH ANGIOPLASTY USING A XENOSURE 1CM X 14CM BIOLOGIC PATCH;  Surgeon: Elam Dutch, MD;  Location: [facility] OR;  Service: Vascular;  Laterality: Right;   PERIPHERAL VASCULAR CATHETERIZATION N/A 12/24/2015   Procedure: Abdominal Aortogram;  Surgeon: Elam Dutch, MD;  Location: [place] CV LAB;  Service: Cardiovascular;  Laterality: N/A;   PERIPHERAL VASCULAR CATHETERIZATION Left 12/24/2015   Procedure: Peripheral Vascular Balloon Angioplasty;  Surgeon: Elam Dutch, MD;  Location: [place] CV LAB;   Service: Cardiovascular;  Laterality: Left;  drug coated balloon   PERIPHERAL VASCULAR CATHETERIZATION N/A 06/30/2016   Procedure: Abdominal Aortogram;  Surgeon: Elam Dutch, MD;  Location: [place] CV LAB;  Service: Cardiovascular;  Laterality: N/A;   PERIPHERAL VASCULAR CATHETERIZATION Bilateral 06/30/2016   Procedure: Lower Extremity Angiography;  Surgeon: Elam Dutch, MD;  Location: [place] CV LAB;  Service: Cardiovascular;  Laterality: Bilateral;   TONSILLECTOMY     Social History   Socioeconomic History   Marital status: Single    Spouse name: Not on file   Number of children: 1   Years of education: Not on file   Highest education level: Not on file  Occupational History   Occupation: Retired    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  Tobacco Use   Smoking status: Former    Packs/day: 0.10    Types: Cigarettes    Quit date:  10/2014    Years since quitting: 7.5    Passive exposure: Never   Smokeless tobacco: Never   Tobacco comments:    pt states that she is using the E cig only   Vaping Use   Vaping Use: Some days  Substance and Sexual Activity   Alcohol use: Yes    Alcohol/week: 0.0 - 1.0 standard drinks    Comment: Occasional; 10-22 rarely    Drug use: No   Sexual activity: Not on file  Other Topics Concern   Not on file  Social History Narrative   Drinks about 2 cups of coffee a day, and some tea    Social Determinants of Health   Financial Resource Strain: Not on file  Food Insecurity: Not on file  Transportation Needs: Not on file  Physical Activity: Not on file  Stress: Not on file  Social Connections: Not on file  Intimate Partner Violence: Not on file   Current Outpatient Medications on File Prior to Visit  Medication Sig Dispense Refill   amLODipine (NORVASC) 10 MG tablet Take 10 mg by mouth daily.     aspirin EC 81 MG tablet Take 81 mg by mouth at bedtime.     Continuous Blood Gluc Sensor (FREESTYLE LIBRE 2 SENSOR) MISC 1 Device by Does not apply route  every 14 (fourteen) days. 6 each 3   diclofenac Sodium (VOLTAREN) 1 % GEL Apply 1 application. topically daily as needed (leg pain).     ferrous sulfate 325 (65 FE) MG EC tablet Take 325 mg by mouth daily.     gabapentin (NEURONTIN) 600 MG tablet Take 600 mg by mouth 3 (three) times daily.     lipase/protease/amylase 24000-76000 units CPEP Take 1 capsule (24,000 Units total) by mouth 3 (three) times daily before meals. (Patient taking differently: Take 24,000 Units by mouth 3 (three) times daily before meals. Take 1 tablet before meals and 1 tablet after meals and 1 tablet with a snack) 270 capsule 0   lovastatin (MEVACOR) 20 MG tablet Take 20 mg by mouth at bedtime.     metFORMIN (GLUCOPHAGE-XR) 500 MG 24 hr tablet Take 4 tablets (2,000 mg total) by mouth daily. 360 tablet 3   metoprolol succinate (TOPROL-XL) 50 MG 24 hr tablet Take 50 mg by mouth daily. Take with or immediately following a meal.     Multiple Vitamins-Minerals (CENTRUM SILVER 50+WOMEN) TABS Take 1 tablet by mouth daily.     Netarsudil-Latanoprost (ROCKLATAN) 0.02-0.005 % SOLN Place 1 drop into both eyes daily.     pantoprazole (PROTONIX) 40 MG tablet Take 40 mg by mouth daily.     polyethylene glycol (MIRALAX / GLYCOLAX) 17 g packet Take 17 g by mouth daily as needed. 14 each 0   RELION INSULIN SYR 0.3ML/31G 31G X 5/16" 0.3 ML MISC USE 1  IN THE MORNING WITH  INSULIN 100 each 0   repaglinide (PRANDIN) 2 MG tablet TAKE 2 TABLETS BY MOUTH  TWICE DAILY BEFORE MEALS (Patient taking differently: Take 2 mg by mouth 2 (two) times daily before a meal.) 360 tablet 3   sertraline (ZOLOFT) 100 MG tablet Take 100 mg by mouth daily.     temazepam (RESTORIL) 30 MG capsule Take 30 mg by mouth at bedtime as needed for sleep.  0   timolol (TIMOPTIC) 0.5 % ophthalmic solution Place 1 drop into both eyes every morning.     No current facility-administered medications on file prior to visit.   Allergies  Allergen  Reactions   Wound Dressing  Adhesive Other (See Comments) and Rash    Pt states that tape and electrodes leave red "scars" on her skin and her skin is very sensitive.  Paper tape ok to use    Family History  Problem Relation Age of Onset   Heart disease Father    Heart attack Father        MI at age 82   Hypertension Mother    Alzheimer's disease Mother    Hypertension Sister    Diabetes Brother    Hyperlipidemia Brother    Hypertension Brother    Heart disease Brother    Heart attack Brother    Breast cancer Maternal Aunt     PE: BP 120/60 (BP Location: Right Arm, Patient Position: Sitting, Cuff Size: Normal)   Pulse 64   Ht '5\' 3"'$  (1.6 m)   Wt 120 lb 9.6 oz (54.7 kg)   SpO2 95%   BMI 21.36 kg/m  Wt Readings from Last 3 Encounters:  04/20/22 120 lb 9.6 oz (54.7 kg)  03/08/22 114 lb (51.7 kg)  02/08/22 115 lb 0.6 oz (52.2 kg)   Constitutional: thin, in NAD Eyes: PERRLA, EOMI, no exophthalmos ENT: moist mucous membranes, no thyromegaly, no cervical lymphadenopathy Cardiovascular: RRR, No MRG, +  Respiratory: CTA B Musculoskeletal: no deformities Skin: moist, warm, no rashes Neurological: no tremor with outstretched hands  ASSESSMENT: 1. DM2, non-insulin-dependent, controlled, with complications - CAD - PAD - s/p stents  2.  Hyperlipidemia  PLAN:  1. Patient with long-standing, uncontrolled diabetes, on oral antidiabetic regimen, with good control at last visit, when she was taking NovoLog before the first meal of the day.  At that time, due to the low HbA1c, Dr. Loanne Drilling suggested to stop insulin.  She is now on repaglinide and metformin.   CGM interpretation: -At today's visit, we reviewed her CGM downloads: It appears that 62% of values are in target range (goal >70%), while 38% are higher than 180 (goal <25%), and 0% are lower than 70 (goal <4%).  The calculated average blood sugar is 171.  The projected HbA1c for the next 3 months (GMI) is 7.4%. -Reviewing the CGM trends, it appears that her  diabetes control deteriorated since last visit.  Sugars are quite fluctuating, between 58 and 300s.  This is usually a sign of insulin deficiency.  She does not appear to need basal insulin, since most of her between meal blood sugars are varying within the target range.  However, her sugars are increasing significantly after each meal.  She mentions that she mostly has 2 meals a day.  She has occasional lows which may be related to repaglinide. - Of note, she has a history of chronic pancreatitis and has a history of pancreatectomy. At this visit, we decided to check her insulin production: Orders Placed This Encounter  Procedures   C-peptide   Glucose, fasting   POCT glycosylated hemoglobin (Hb A1C)  -At this visit, I suggested to restart NovoLog insulin before the 2 main meals of the day.  We discussed about how to vary the dose of insulin based on the size and consistency of her meals.  I also advised her to take this 15 minutes before the meal. -She has previously been on insulin vials and at this visit we discussed about how to bolus insulin with the insulin pen.  We showed her a pen and explained how to use it correctly. - I suggested to:  Patient Instructions  Please continue: - Metformin ER 1000 mg 2x a day, with meals  Stop Repaglinide.  Start: Novolog 4-8 units 2x a day 15 min before the meal  Please stop at the lab.  Please return in 4 months with your sugar log.   - check sugars at different times of the day - check 4x a day, rotating checks - discussed about CBG targets for treatment: 80-130 mg/dL before meals and <180 mg/dL after meals; target HbA1c <7%. - given sugar log and advised how to fill it and to bring it at next appt  - given foot care handout  - given instructions for hypoglycemia management "15-15 rule"  - advised for yearly eye exams  - Return to clinic in 4 months   2. HL - Reviewed latest lipid panel from 01/2021: All fractions at goal: Lab Results   Component Value Date   CHOL 99 (L) 02/08/2022   HDL 62 02/08/2022   LDLCALC 21 02/08/2022   TRIG 77 02/08/2022   CHOLHDL 1.6 02/08/2022  - Continues lovastatin 20 mg daily without side effects.  - Total time spent for the visit: 40 minutes, in obtaining medical information from the patient, from the chart, reviewing Dr. Cordelia Pen last note, her  previous labs,  evaluations, and treatments, reviewing her symptoms, counseling her about correct insulin use, counseling her about her conditions (please see the discussed topics above), and developing a plan to further investigate and treat them; she had a number of questions which I addressed.  Component     Latest Ref Rng 04/20/2022  Glucose     65 - 99 mg/dL 140 (H)   Hemoglobin A1C     4.0 - 5.6 % 7.0 !   C-Peptide     0.80 - 3.85 ng/mL 1.50     C-peptide is normal, which is good news.  She may have selective insulin insufficiency after meals.  In this case, going ahead with NovoLog, without basal insulin is the best option.  Philemon Kingdom, MD PhD [facility] Endocrinology

## 2022-04-20 NOTE — Patient Instructions (Addendum)
Please continue: - Metformin ER 1000 mg 2x a day, with meals  Stop Repaglinide.  Start: Novolog 4-8 units 2x a day 15 min before the meal  Please stop at the lab.  Please return in 4 months with your sugar log.   PATIENT INSTRUCTIONS FOR TYPE 2 DIABETES:  DIET AND EXERCISE Diet and exercise is an important part of diabetic treatment.  We recommended aerobic exercise in the form of brisk walking (working between 40-60% of maximal aerobic capacity, similar to brisk walking) for 150 minutes per week (such as 30 minutes five days per week) along with 3 times per week performing 'resistance' training (using various gauge rubber tubes with handles) 5-10 exercises involving the major muscle groups (upper body, lower body and core) performing 10-15 repetitions (or near fatigue) each exercise. Start at half the above goal but build slowly to reach the above goals. If limited by weight, joint pain, or disability, we recommend daily walking in a swimming pool with water up to waist to reduce pressure from joints while allow for adequate exercise.    BLOOD GLUCOSES Monitoring your blood glucoses is important for continued management of your diabetes. Please check your blood glucoses 2-4 times a day: fasting, before meals and at bedtime (you can rotate these measurements - e.g. one day check before the 3 meals, the next day check before 2 of the meals and before bedtime, etc.).   HYPOGLYCEMIA (low blood sugar) Hypoglycemia is usually a reaction to not eating, exercising, or taking too much insulin/ other diabetes drugs.  Symptoms include tremors, sweating, hunger, confusion, headache, etc. Treat IMMEDIATELY with 15 grams of Carbs: 4 glucose tablets  cup regular juice/soda 2 tablespoons raisins 4 teaspoons sugar 1 tablespoon honey Recheck blood glucose in 15 mins and repeat above if still symptomatic/blood glucose <100.  RECOMMENDATIONS TO REDUCE YOUR RISK OF DIABETIC COMPLICATIONS: * Take your  prescribed MEDICATION(S) * Follow a DIABETIC diet: Complex carbs, fiber rich foods, (monounsaturated and polyunsaturated) fats * AVOID saturated/trans fats, high fat foods, >2,300 mg salt per day. * EXERCISE at least 5 times a week for 30 minutes or preferably daily.  * DO NOT SMOKE OR DRINK more than 1 drink a day. * Check your FEET every day. Do not wear tightfitting shoes. Contact us if you develop an ulcer * See your EYE doctor once a year or more if needed * Get a FLU shot once a year * Get a PNEUMONIA vaccine once before and once after age 23 years  GOALS:  * Your Hemoglobin A1c of <7%  * fasting sugars need to be <130 * after meals sugars need to be <180 (2h after you start eating) * Your Systolic BP should be 883 or lower  * Your Diastolic BP should be 80 or lower  * Your HDL (Good Cholesterol) should be 40 or higher  * Your LDL (Bad Cholesterol) should be 100 or lower. * Your Triglycerides should be 150 or lower  * Your Urine microalbumin (kidney function) should be <30 * Your Body Mass Index should be 25 or lower

## 2022-04-21 ENCOUNTER — Telehealth: Payer: Self-pay

## 2022-04-21 LAB — C-PEPTIDE: C-Peptide: 1.5 ng/mL (ref 0.80–3.85)

## 2022-04-21 LAB — GLUCOSE, FASTING: Glucose, Bld: 140 mg/dL — ABNORMAL HIGH (ref 65–99)

## 2022-04-21 NOTE — Telephone Encounter (Signed)
Noted.  Great!

## 2022-04-21 NOTE — Telephone Encounter (Signed)
Pt called back to advise she contacted the insurance and they contacted the pharmacy and rx was able to be filled. No further action needed.

## 2022-04-21 NOTE — Telephone Encounter (Signed)
Pt called to advise Novolog not covered per pharmacy. Pt was advised to contact insurance to determined which meal time insulin is covered and call us back.

## 2022-04-24 NOTE — Telephone Encounter (Signed)
Pt called to advise her blood sugars have been low since starting the Novolog. Pt had a low of 42 (Libre sensors). Pt is only taking 4 units 10-15 minutes before eating. Pt wants to know if she should reduce units.

## 2022-05-04 DIAGNOSIS — M65312 Trigger thumb, left thumb: Secondary | ICD-10-CM | POA: Diagnosis not present

## 2022-05-04 DIAGNOSIS — M65342 Trigger finger, left ring finger: Secondary | ICD-10-CM | POA: Diagnosis not present

## 2022-05-04 DIAGNOSIS — M65332 Trigger finger, left middle finger: Secondary | ICD-10-CM | POA: Diagnosis not present

## 2022-05-05 ENCOUNTER — Other Ambulatory Visit: Payer: Self-pay | Admitting: *Deleted

## 2022-05-05 ENCOUNTER — Telehealth: Payer: Self-pay

## 2022-05-05 ENCOUNTER — Ambulatory Visit: Payer: No Typology Code available for payment source | Admitting: Vascular Surgery

## 2022-05-05 DIAGNOSIS — I739 Peripheral vascular disease, unspecified: Secondary | ICD-10-CM

## 2022-05-05 DIAGNOSIS — Z95828 Presence of other vascular implants and grafts: Secondary | ICD-10-CM

## 2022-05-05 NOTE — Telephone Encounter (Signed)
Called pt to inform her of upcoming appts for [place] on 6/19 @ 1200, arrival @ 1145, and Dr Stanford Breed on 6/20 @ 0820, arrival @ 0800. Pt confirmed understanding.

## 2022-05-05 NOTE — Telephone Encounter (Signed)
Pt called with c/o BLE swelling starting approx 2 wks ago. The swelling starts at the ankle and is present up to the knee. She has intermittent pain in her ankle, soreness in her leg, and tightness in both legs when walking.  Returned pt's call, two identifiers used. Reviewed pt's chart. Pt stated that she wears her compression socks all the time, except when bathing and elevates her feet. She noted improvement in the swelling after elevating at HS, but it's not long before it's worse after getting up out of bed. She states that the intermittent pain is a stabbing, shooting pain with or without activity. Pt was instructed to make sure to elevate her feet above heart level as often as possible and continue wearing compression socks. Will discuss with provider to see if pt needs an appt. Will call pt back with details and further advice. Pt confirmed understanding.

## 2022-05-08 ENCOUNTER — Ambulatory Visit (HOSPITAL_COMMUNITY)
Admission: RE | Admit: 2022-05-08 | Discharge: 2022-05-08 | Disposition: A | Discharge status: Home / Self Care | Payer: No Typology Code available for payment source | Source: Ambulatory Visit | Attending: Vascular Surgery | Admitting: Vascular Surgery

## 2022-05-08 ENCOUNTER — Ambulatory Visit (INDEPENDENT_AMBULATORY_CARE_PROVIDER_SITE_OTHER)
Admission: RE | Admit: 2022-05-08 | Discharge: 2022-05-08 | Disposition: A | Discharge status: Home / Self Care | Payer: No Typology Code available for payment source | Source: Ambulatory Visit | Attending: Vascular Surgery | Admitting: Vascular Surgery

## 2022-05-08 DIAGNOSIS — Z95828 Presence of other vascular implants and grafts: Secondary | ICD-10-CM

## 2022-05-08 DIAGNOSIS — I739 Peripheral vascular disease, unspecified: Secondary | ICD-10-CM | POA: Diagnosis not present

## 2022-05-08 NOTE — Progress Notes (Unsigned)
Patient ID: Jill Higgins, female   DOB: 03/20/1946, 76 y.o.   MRN: [id number]  Reason for Consult: No chief complaint on file.   Referred by Carol Ada, MD  Subjective:     HPI:  Jill Higgins is a 76 y.o. female with a previous history of bilateral lower extremity bypasses the right of which was initially performed in 2014 and she also required revision more recently.  She also had angioplasty of the distal anastomosis of the left in 2017.  She underwent Whipple procedure in 2020 and has recovered well from that surgery with minimal residual abdominal pain.  She currently walks without limitations.  She denies any rest pain or tissue loss.  She does not have any personal history of aneurysm but possibly has a distant relative with history of aortic aneurysm.  She denies any stroke, TIA or amaurosis.  She is a former smoker.  She continues on aspirin and statin.  Past Medical History:  Diagnosis Date  . Bronchitis   . Bulging disc    in neck  . Depression with anxiety   . Diabetes mellitus    Type II  . Fibromyalgia    pt. denies  . GERD (gastroesophageal reflux disease)   . Glaucoma   . History of bronchitis   . Hyperlipidemia   . Hypertension   . Overactive bladder   . Peripheral vascular disease ([place])   . Pneumonia   . Sleep apnea    recent test negative for sleep apnea  . Tobacco abuse   . Urinary frequency    Family History  Problem Relation Age of Onset  . Heart disease Father   . Heart attack Father        MI at age 41  . Hypertension Mother   . Alzheimer's disease Mother   . Hypertension Sister   . Diabetes Brother   . Hyperlipidemia Brother   . Hypertension Brother   . Heart disease Brother   . Heart attack Brother   . Breast cancer Maternal Aunt    Past Surgical History:  Procedure Laterality Date  . ABDOMINAL AORTAGRAM N/A 08/15/2013   Procedure: ABDOMINAL Maxcine Ham;  Surgeon: Elam Dutch, MD;  Location: [facility] CATH LAB;  Service:  Cardiovascular;  Laterality: N/A;  . ABDOMINAL AORTAGRAM N/A 06/19/2014   Procedure: ABDOMINAL Maxcine Ham;  Surgeon: Elam Dutch, MD;  Location: [facility] CATH LAB;  Service: Cardiovascular;  Laterality: N/A;  . ABDOMINAL AORTOGRAM W/LOWER EXTREMITY N/A 05/10/2018   Procedure: ABDOMINAL AORTOGRAM W/LOWER EXTREMITY;  Surgeon: Elam Dutch, MD;  Location: [place] CV LAB;  Service: Cardiovascular;  Laterality: N/A;  bilateral  . Aortogram w/ PTA  05/14/08,  11-04-10   Bilateral aortogram w/ bilateral  SFA PTA  stenting   . BREAST SURGERY Right    boil removal  . BUNIONECTOMY     L foot in the 1980s  . COLONOSCOPY     2014  . DILATION AND CURETTAGE OF UTERUS     X4  . ENDARTERECTOMY FEMORAL Right 06/03/2018   Procedure: RIGHT FEMORAL ENDARTERECTOMY;  Surgeon: Elam Dutch, MD;  Location: [facility] OR;  Service: Vascular;  Laterality: Right;  . Epidural shots in neck     . ESOPHAGOGASTRODUODENOSCOPY (EGD) WITH PROPOFOL N/A 09/15/2019   Procedure: ESOPHAGOGASTRODUODENOSCOPY (EGD) WITH PROPOFOL;  Surgeon: Arta Silence, MD;  Location: [facility] ENDOSCOPY;  Service: Endoscopy;  Laterality: N/A;  . EUS N/A 09/15/2019   Procedure: UPPER ENDOSCOPIC ULTRASOUND (EUS) LINEAR;  Surgeon: Paulita Fujita,  Gwyndolyn Saxon, MD;  Location: [facility];  Service: Endoscopy;  Laterality: N/A;  . EYE SURGERY Bilateral 2016   cataract removal  . FEMORAL-POPLITEAL BYPASS GRAFT Left 10/21/2013   Procedure: LEFT FEMORAL-POPLITEAL ARTERY BYPASS WITH SAPHENOUS VEIN GRAFT , POPLITEAL ENDARTERECTOMY ,INTRAOPERATIVE ARTERIOGRAM, vein patch angioplasty to popliteal artery;  Surgeon: Elam Dutch, MD;  Location: [place];  Service: Vascular;  Laterality: Left;  . FEMORAL-POPLITEAL BYPASS GRAFT Right 02/08/2016   Procedure: Right FEMORAL- to Above Knee POPLITEAL ARTERY Bypass Graft with reversed saphenous vein and Common Femoral Endarterectomy  with profundoplasty;  Surgeon: Elam Dutch, MD;  Location: [place];  Service: Vascular;   Laterality: Right;  . FINE NEEDLE ASPIRATION N/A 09/15/2019   Procedure: FINE NEEDLE ASPIRATION (FNA) LINEAR;  Surgeon: Arta Silence, MD;  Location: [facility] ENDOS[facility];  Service: Endoscopy;  Laterality: N/A;  . GROIN DEBRIDEMENT Left 10/28/2013   Procedure: left inner thigh DEBRIDEMENT;  Surgeon: Elam Dutch, MD;  Location: [place];  Service: Vascular;  Laterality: Left;  . HAND SURGERY Left 2010  . LOWER EXTREMITY ANGIOGRAM Bilateral 12/24/2015   Procedure: Lower Extremity Angiogram;  Surgeon: Elam Dutch, MD;  Location: [place] CV LAB;  Service: Cardiovascular;  Laterality: Bilateral;  . PATCH ANGIOPLASTY Right 06/03/2018   Procedure: PATCH ANGIOPLASTY USING A XENOSURE 1CM X 14CM BIOLOGIC PATCH;  Surgeon: Elam Dutch, MD;  Location: [place];  Service: Vascular;  Laterality: Right;  . PERIPHERAL VASCULAR CATHETERIZATION N/A 12/24/2015   Procedure: Abdominal Aortogram;  Surgeon: Elam Dutch, MD;  Location: [place] CV LAB;  Service: Cardiovascular;  Laterality: N/A;  . PERIPHERAL VASCULAR CATHETERIZATION Left 12/24/2015   Procedure: Peripheral Vascular Balloon Angioplasty;  Surgeon: Elam Dutch, MD;  Location: [place] CV LAB;  Service: Cardiovascular;  Laterality: Left;  drug coated balloon  . PERIPHERAL VASCULAR CATHETERIZATION N/A 06/30/2016   Procedure: Abdominal Aortogram;  Surgeon: Elam Dutch, MD;  Location: [place] CV LAB;  Service: Cardiovascular;  Laterality: N/A;  . PERIPHERAL VASCULAR CATHETERIZATION Bilateral 06/30/2016   Procedure: Lower Extremity Angiography;  Surgeon: Elam Dutch, MD;  Location: [place] CV LAB;  Service: Cardiovascular;  Laterality: Bilateral;  . TONSILLECTOMY      Short Social History:  Social History   Tobacco Use  . Smoking status: Former    Packs/day: 0.10    Types: Cigarettes    Quit date: 10/2014    Years since quitting: 7.5    Passive exposure: Never  . Smokeless tobacco: Never  . Tobacco comments:    pt  states that she is using the E cig only   Substance Use Topics  . Alcohol use: Yes    Alcohol/week: 0.0 - 1.0 standard drinks of alcohol    Comment: Occasional; 10-22 rarely     Allergies  Allergen Reactions  . Wound Dressing Adhesive Other (See Comments) and Rash    Pt states that tape and electrodes leave red "scars" on her skin and her skin is very sensitive.  Paper tape ok to use     Current Outpatient Medications  Medication Sig Dispense Refill  . amLODipine (NORVASC) 10 MG tablet Take 10 mg by mouth daily.    Marland Kitchen aspirin EC 81 MG tablet Take 81 mg by mouth at bedtime.    . Continuous Blood Gluc Sensor (FREESTYLE LIBRE 2 SENSOR) MISC 1 Device by Does not apply route every 14 (fourteen) days. 6 each 3  . diclofenac Sodium (VOLTAREN) 1 % GEL Apply 1 application. topically  daily as needed (leg pain).    . ferrous sulfate 325 (65 FE) MG EC tablet Take 325 mg by mouth daily.    Marland Kitchen gabapentin (NEURONTIN) 600 MG tablet Take 600 mg by mouth 3 (three) times daily.    . insulin aspart (NOVOLOG FLEXPEN) 100 UNIT/ML FlexPen Inject 4-8 Units into the skin 3 (three) times daily with meals. 15 mL 11  . Insulin Pen Needle 32G X 4 MM MISC Use 2x a day 200 each 3  . lipase/protease/amylase 24000-76000 units CPEP Take 1 capsule (24,000 Units total) by mouth 3 (three) times daily before meals. (Patient taking differently: Take 24,000 Units by mouth 3 (three) times daily before meals. Take 1 tablet before meals and 1 tablet after meals and 1 tablet with a snack) 270 capsule 0  . lovastatin (MEVACOR) 20 MG tablet Take 20 mg by mouth at bedtime.    . metFORMIN (GLUCOPHAGE-XR) 500 MG 24 hr tablet Take 4 tablets (2,000 mg total) by mouth daily. (Patient taking differently: Take 500 mg by mouth 3 (three) times daily.) 360 tablet 3  . metoprolol succinate (TOPROL-XL) 50 MG 24 hr tablet Take 50 mg by mouth daily. Take with or immediately following a meal.    . Multiple Vitamins-Minerals (CENTRUM SILVER 50+WOMEN)  TABS Take 1 tablet by mouth daily.    . Netarsudil-Latanoprost (ROCKLATAN) 0.02-0.005 % SOLN Place 1 drop into both eyes daily.    . pantoprazole (PROTONIX) 40 MG tablet Take 40 mg by mouth daily.    . polyethylene glycol (MIRALAX / GLYCOLAX) 17 g packet Take 17 g by mouth daily as needed. 14 each 0  . sertraline (ZOLOFT) 100 MG tablet Take 100 mg by mouth daily.    . temazepam (RESTORIL) 30 MG capsule Take 30 mg by mouth at bedtime as needed for sleep.  0  . timolol (TIMOPTIC) 0.5 % ophthalmic solution Place 1 drop into both eyes every morning.     No current facility-administered medications for this visit.    Review of Systems  Constitutional:  Constitutional negative. HENT: HENT negative.  Eyes: Eyes negative.  Respiratory: Respiratory negative.  Cardiovascular: Cardiovascular negative.  GI: Gastrointestinal negative.  Musculoskeletal: Musculoskeletal negative.  Skin: Skin negative.  Neurological: Neurological negative. Hematologic: Hematologic/lymphatic negative.  Psychiatric: Psychiatric negative.       Objective:  Objective   There were no vitals filed for this visit.  There is no height or weight on file to calculate BMI.  Physical Exam HENT:     Head: Normocephalic.     Nose: Nose normal.  Eyes:     Pupils: Pupils are equal, round, and reactive to light.  Neck:     Vascular: No carotid bruit.  Cardiovascular:     Rate and Rhythm: Normal rate.     Pulses:          Posterior tibial pulses are 2+ on the right side and 2+ on the left side.     Heart sounds: Normal heart sounds.  Pulmonary:     Effort: Pulmonary effort is normal.     Breath sounds: Normal breath sounds.  Abdominal:     General: Abdomen is flat.     Palpations: Abdomen is soft.  Musculoskeletal:        General: Normal range of motion.     Cervical back: Normal range of motion and neck supple.     Right lower leg: No edema.     Left lower leg: No edema.  Skin:  General: Skin is warm and  dry.     Capillary Refill: Capillary refill takes less than 2 seconds.  Neurological:     General: No focal deficit present.     Mental Status: She is alert.  Psychiatric:        Mood and Affect: Mood normal.        Behavior: Behavior normal.        Thought Content: Thought content normal.    Data: Right Graft #1:  +------------------+--------+--------+---------+--------+                    PSV cm/sStenosisWaveform Comments  +------------------+--------+--------+---------+--------+  Inflow            98              biphasic           +------------------+--------+--------+---------+--------+  Prox Anastomosis  49              triphasic          +------------------+--------+--------+---------+--------+  Proximal Graft    71              biphasic           +------------------+--------+--------+---------+--------+  Mid Graft         71              biphasic           +------------------+--------+--------+---------+--------+  Distal Graft      55              biphasic           +------------------+--------+--------+---------+--------+  Distal Anastomosis83              biphasic           +------------------+--------+--------+---------+--------+  Outflow           118             biphasic           +------------------+--------+--------+---------+--------+      Left Graft #1: +--------------------+--------+--------+--------+--------+                      PSV cm/sStenosisWaveformComments  +--------------------+--------+--------+--------+--------+  Inflow              170             biphasicplaque    +--------------------+--------+--------+--------+--------+  Proximal Anastomosis206             biphasic          +--------------------+--------+--------+--------+--------+  Proximal Graft      63              biphasic          +--------------------+--------+--------+--------+--------+  Mid Graft           57               biphasic          +--------------------+--------+--------+--------+--------+  Distal Graft        71              biphasic          +--------------------+--------+--------+--------+--------+  Distal Anastomosis  104             biphasic          +--------------------+--------+--------+--------+--------+  Outflow             117  biphasic          +--------------------+--------+--------+--------+--------+   Right EIA distal 256 cm.s        Summary:  Patent bilateral femoral to popliteal bypass grafts with no visualized  stenosis.    ABI Findings:  +---------+------------------+-----+--------+--------+  Right    Rt Pressure (mmHg)IndexWaveformComment   +---------+------------------+-----+--------+--------+  Brachial 151                                      +---------+------------------+-----+--------+--------+  PTA      154               1.02 biphasic          +---------+------------------+-----+--------+--------+  DP       153               1.01 biphasic          +---------+------------------+-----+--------+--------+  Great Toe106               0.70 Normal            +---------+------------------+-----+--------+--------+   +---------+------------------+-----+--------+-------+  Left     Lt Pressure (mmHg)IndexWaveformComment  +---------+------------------+-----+--------+-------+  Brachial 147                                     +---------+------------------+-----+--------+-------+  PTA      152               1.01 biphasic         +---------+------------------+-----+--------+-------+  DP       155               1.03 biphasic         +---------+------------------+-----+--------+-------+  Great Toe114               0.75 Normal           +---------+------------------+-----+--------+-------+   +-------+-----------+-----------+------------+------------+  ABI/TBIToday's  ABIToday's TBIPrevious ABIPrevious TBI  +-------+-----------+-----------+------------+------------+  Right  1.02       0.70       0.92        0.73          +-------+-----------+-----------+------------+------------+  Left   1.03       0.75       1.01        0.71          +-------+-----------+-----------+------------+------------+          Assessment/Plan:    76 year old female status post above noted procedures including bilateral lower extremity bypasses now with preserved flow throughout palpable dorsalis pedis pulses bilaterally.  She will continue aspirin and statin we will have her follow-up in 1 year with repeat ultrasound studies.     Cherre Robins MD [facility]

## 2022-05-09 ENCOUNTER — Ambulatory Visit (INDEPENDENT_AMBULATORY_CARE_PROVIDER_SITE_OTHER): Payer: No Typology Code available for payment source | Admitting: Vascular Surgery

## 2022-05-09 ENCOUNTER — Other Ambulatory Visit: Payer: Self-pay | Admitting: *Deleted

## 2022-05-09 ENCOUNTER — Encounter: Payer: Self-pay | Admitting: Vascular Surgery

## 2022-05-09 VITALS — BP 138/90 | HR 62 | Temp 98.0°F | Resp 20 | Ht 63.0 in | Wt 117.0 lb

## 2022-05-09 DIAGNOSIS — I739 Peripheral vascular disease, unspecified: Secondary | ICD-10-CM

## 2022-05-09 DIAGNOSIS — R600 Localized edema: Secondary | ICD-10-CM

## 2022-05-09 DIAGNOSIS — Z95828 Presence of other vascular implants and grafts: Secondary | ICD-10-CM

## 2022-05-09 DIAGNOSIS — M79606 Pain in leg, unspecified: Secondary | ICD-10-CM

## 2022-05-11 ENCOUNTER — Inpatient Hospital Stay (HOSPITAL_COMMUNITY): Admission: RE | Admit: 2022-05-11 | Payer: No Typology Code available for payment source | Source: Ambulatory Visit

## 2022-05-12 ENCOUNTER — Ambulatory Visit (HOSPITAL_COMMUNITY)
Admission: RE | Admit: 2022-05-12 | Discharge: 2022-05-12 | Disposition: A | Discharge status: Home / Self Care | Payer: No Typology Code available for payment source | Source: Ambulatory Visit | Attending: Vascular Surgery | Admitting: Vascular Surgery

## 2022-05-12 DIAGNOSIS — M79606 Pain in leg, unspecified: Secondary | ICD-10-CM | POA: Diagnosis not present

## 2022-05-12 DIAGNOSIS — R600 Localized edema: Secondary | ICD-10-CM | POA: Diagnosis not present

## 2022-05-12 DIAGNOSIS — Z95828 Presence of other vascular implants and grafts: Secondary | ICD-10-CM

## 2022-05-17 DIAGNOSIS — M79604 Pain in right leg: Secondary | ICD-10-CM | POA: Diagnosis not present

## 2022-05-17 DIAGNOSIS — R609 Edema, unspecified: Secondary | ICD-10-CM | POA: Diagnosis not present

## 2022-05-17 DIAGNOSIS — M79605 Pain in left leg: Secondary | ICD-10-CM | POA: Diagnosis not present

## 2022-05-17 DIAGNOSIS — Z7984 Long term (current) use of oral hypoglycemic drugs: Secondary | ICD-10-CM | POA: Diagnosis not present

## 2022-05-17 DIAGNOSIS — E1165 Type 2 diabetes mellitus with hyperglycemia: Secondary | ICD-10-CM | POA: Diagnosis not present

## 2022-05-18 ENCOUNTER — Telehealth: Payer: Self-pay

## 2022-05-18 DIAGNOSIS — E0800 Diabetes mellitus due to underlying condition with hyperosmolarity without nonketotic hyperglycemic-hyperosmolar coma (NKHHC): Secondary | ICD-10-CM | POA: Diagnosis not present

## 2022-05-18 DIAGNOSIS — I771 Stricture of artery: Secondary | ICD-10-CM | POA: Diagnosis not present

## 2022-05-18 DIAGNOSIS — K8689 Other specified diseases of pancreas: Secondary | ICD-10-CM | POA: Diagnosis not present

## 2022-05-18 DIAGNOSIS — K551 Chronic vascular disorders of intestine: Secondary | ICD-10-CM | POA: Diagnosis not present

## 2022-05-18 DIAGNOSIS — K219 Gastro-esophageal reflux disease without esophagitis: Secondary | ICD-10-CM | POA: Diagnosis not present

## 2022-05-18 DIAGNOSIS — I774 Celiac artery compression syndrome: Secondary | ICD-10-CM | POA: Diagnosis not present

## 2022-05-18 DIAGNOSIS — K861 Other chronic pancreatitis: Secondary | ICD-10-CM | POA: Diagnosis not present

## 2022-05-18 NOTE — Telephone Encounter (Signed)
Pt contacted to confirm blood sugar readings and pt advised readings of 274 308 and 298 before meals. (Pt was at a CT scan appt and could not confirm exact blood sugar readings so she gave estimations) Pt was not sure of how much insulin she was supposed to be taking and was advised per Dr Arman Filter instructions to take 4-8 units of insulin before meals. Her dose was decreased due to her previously low readings. Pt confirmed she has taken 6 units when her sugar was 298 but that was the most she had administered. Pt wants to know how to get her readings lowered.

## 2022-05-18 NOTE — Telephone Encounter (Signed)
Pt called to advise she was experiencing high blood sugars after reducing her Novolog insulin to 2-4 units before meals.

## 2022-05-19 NOTE — Telephone Encounter (Signed)
Attempted to contact the patient in regards to insulin adjustments, LVM for a call back to discuss.

## 2022-05-19 NOTE — Telephone Encounter (Signed)
Patient notified and will make medication adjustments

## 2022-06-02 DIAGNOSIS — J449 Chronic obstructive pulmonary disease, unspecified: Secondary | ICD-10-CM | POA: Diagnosis not present

## 2022-06-02 DIAGNOSIS — E1165 Type 2 diabetes mellitus with hyperglycemia: Secondary | ICD-10-CM | POA: Diagnosis not present

## 2022-06-02 DIAGNOSIS — I10 Essential (primary) hypertension: Secondary | ICD-10-CM | POA: Diagnosis not present

## 2022-06-02 DIAGNOSIS — K219 Gastro-esophageal reflux disease without esophagitis: Secondary | ICD-10-CM | POA: Diagnosis not present

## 2022-06-02 DIAGNOSIS — E7800 Pure hypercholesterolemia, unspecified: Secondary | ICD-10-CM | POA: Diagnosis not present

## 2022-06-05 DIAGNOSIS — E0800 Diabetes mellitus due to underlying condition with hyperosmolarity without nonketotic hyperglycemic-hyperosmolar coma (NKHHC): Secondary | ICD-10-CM | POA: Diagnosis not present

## 2022-06-05 DIAGNOSIS — K219 Gastro-esophageal reflux disease without esophagitis: Secondary | ICD-10-CM | POA: Diagnosis not present

## 2022-06-05 DIAGNOSIS — K861 Other chronic pancreatitis: Secondary | ICD-10-CM | POA: Diagnosis not present

## 2022-06-06 DIAGNOSIS — R079 Chest pain, unspecified: Secondary | ICD-10-CM | POA: Diagnosis not present

## 2022-06-06 DIAGNOSIS — R609 Edema, unspecified: Secondary | ICD-10-CM | POA: Diagnosis not present

## 2022-06-18 NOTE — Progress Notes (Unsigned)
[facility] [place]  ASSESSMENT / PLAN: 76 y.o. female with celiac and superior mesenteric artery stenosis on recent CT scan done in surveillance of chronic pancreatitis.   CHIEF COMPLAINT: ***  HISTORY OF PRESENT ILLNESS: Jill Higgins is a 76 y.o. female ***  VASCULAR SURGICAL HISTORY: ***  VASCULAR RISK FACTORS: {FINDINGS; POSITIVE NEGATIVE:[id number]} history of stroke / transient ischemic attack. {FINDINGS; POSITIVE NEGATIVE:[phone]} history of coronary artery disease. *** history of PCI. *** history of CABG.  {FINDINGS; POSITIVE NEGATIVE:[id number]} history of diabetes mellitus. Last A1c ***. {FINDINGS; POSITIVE NEGATIVE:[id number]} history of smoking. *** actively smoking. {FINDINGS; POSITIVE NEGATIVE:318-701-2603} history of hypertension. *** drug regimen with *** control. {FINDINGS; POSITIVE NEGATIVE:[id number]} history of chronic kidney disease.  Last GFR ***. CKD {stage:30421363}. {FINDINGS; POSITIVE NEGATIVE:[phone]} history of chronic obstructive pulmonary disease, treated with ***.  FUNCTIONAL STATUS: ECOG performance status: {findings; ecog performance status:31780} Ambulatory status: {TNHAmbulation:25868}  Past Medical History:  Diagnosis Date   Bronchitis    Bulging disc    in neck   Depression with anxiety    Diabetes mellitus    Type II   Fibromyalgia    pt. denies   GERD (gastroesophageal reflux disease)    Glaucoma    History of bronchitis    Hyperlipidemia    Hypertension    Overactive bladder    Peripheral vascular disease (HCC)    Pneumonia    Sleep apnea    recent test negative for sleep apnea   Tobacco abuse    Urinary frequency     Past Surgical History:  Procedure Laterality Date   ABDOMINAL AORTAGRAM N/A 08/15/2013   Procedure: ABDOMINAL Maxcine Ham;  Surgeon: Elam Dutch, MD;  Location: [facility] CATH LAB;  Service: Cardiovascular;  Laterality: N/A;   ABDOMINAL AORTAGRAM N/A 06/19/2014   Procedure: ABDOMINAL  Maxcine Ham;  Surgeon: Elam Dutch, MD;  Location: [facility] CATH LAB;  Service: Cardiovascular;  Laterality: N/A;   ABDOMINAL AORTOGRAM W/LOWER EXTREMITY N/A 05/10/2018   Procedure: ABDOMINAL AORTOGRAM W/LOWER EXTREMITY;  Surgeon: Elam Dutch, MD;  Location: [place] CV LAB;  Service: Cardiovascular;  Laterality: N/A;  bilateral   Aortogram w/ PTA  05/14/08,  11-04-10   Bilateral aortogram w/ bilateral  SFA PTA  stenting    BREAST SURGERY Right    boil removal   BUNIONECTOMY     L foot in the 1980s   COLONOSCOPY     2014   DILATION AND CURETTAGE OF UTERUS     X4   ENDARTERECTOMY FEMORAL Right 06/03/2018   Procedure: RIGHT FEMORAL ENDARTERECTOMY;  Surgeon: Elam Dutch, MD;  Location: [place];  Service: Vascular;  Laterality: Right;   Epidural shots in neck      ESOPHAGOGASTRODUODENOSCOPY (EGD) WITH PROPOFOL N/A 09/15/2019   Procedure: ESOPHAGOGASTRODUODENOSCOPY (EGD) WITH PROPOFOL;  Surgeon: Arta Silence, MD;  Location: [facility] ENDOSCOPY;  Service: Endoscopy;  Laterality: N/A;   EUS N/A 09/15/2019   Procedure: UPPER ENDOSCOPIC ULTRASOUND (EUS) LINEAR;  Surgeon: Arta Silence, MD;  Location: [facility] ENDOSCOPY;  Service: Endoscopy;  Laterality: N/A;   EYE SURGERY Bilateral 2016   cataract removal   FEMORAL-POPLITEAL BYPASS GRAFT Left 10/21/2013   Procedure: LEFT FEMORAL-POPLITEAL ARTERY BYPASS WITH SAPHENOUS VEIN GRAFT , POPLITEAL ENDARTERECTOMY ,INTRAOPERATIVE ARTERIOGRAM, vein patch angioplasty to popliteal artery;  Surgeon: Elam Dutch, MD;  Location: [facility] OR;  Service: Vascular;  Laterality: Left;   FEMORAL-POPLITEAL BYPASS GRAFT Right 02/08/2016   Procedure: Right FEMORAL- to Above Knee POPLITEAL ARTERY Bypass Graft with reversed saphenous vein  and Common Femoral Endarterectomy  with profundoplasty;  Surgeon: Elam Dutch, MD;  Location: [place];  Service: Vascular;  Laterality: Right;   FINE NEEDLE ASPIRATION N/A 09/15/2019   Procedure: FINE NEEDLE ASPIRATION (FNA) LINEAR;   Surgeon: Arta Silence, MD;  Location: [facility] ENDOSCOPY;  Service: Endoscopy;  Laterality: N/A;   GROIN DEBRIDEMENT Left 10/28/2013   Procedure: left inner thigh DEBRIDEMENT;  Surgeon: Elam Dutch, MD;  Location: [facility] OR;  Service: Vascular;  Laterality: Left;   HAND SURGERY Left 2010   LOWER EXTREMITY ANGIOGRAM Bilateral 12/24/2015   Procedure: Lower Extremity Angiogram;  Surgeon: Elam Dutch, MD;  Location: [place] CV LAB;  Service: Cardiovascular;  Laterality: Bilateral;   PATCH ANGIOPLASTY Right 06/03/2018   Procedure: PATCH ANGIOPLASTY USING A XENOSURE 1CM X 14CM BIOLOGIC PATCH;  Surgeon: Elam Dutch, MD;  Location: [facility] OR;  Service: Vascular;  Laterality: Right;   PERIPHERAL VASCULAR CATHETERIZATION N/A 12/24/2015   Procedure: Abdominal Aortogram;  Surgeon: Elam Dutch, MD;  Location: [place] CV LAB;  Service: Cardiovascular;  Laterality: N/A;   PERIPHERAL VASCULAR CATHETERIZATION Left 12/24/2015   Procedure: Peripheral Vascular Balloon Angioplasty;  Surgeon: Elam Dutch, MD;  Location: [place] CV LAB;  Service: Cardiovascular;  Laterality: Left;  drug coated balloon   PERIPHERAL VASCULAR CATHETERIZATION N/A 06/30/2016   Procedure: Abdominal Aortogram;  Surgeon: Elam Dutch, MD;  Location: [place] CV LAB;  Service: Cardiovascular;  Laterality: N/A;   PERIPHERAL VASCULAR CATHETERIZATION Bilateral 06/30/2016   Procedure: Lower Extremity Angiography;  Surgeon: Elam Dutch, MD;  Location: [place] CV LAB;  Service: Cardiovascular;  Laterality: Bilateral;   TONSILLECTOMY      Family History  Problem Relation Age of Onset   Heart disease Father    Heart attack Father        MI at age 43   Hypertension Mother    Alzheimer's disease Mother    Hypertension Sister    Diabetes Brother    Hyperlipidemia Brother    Hypertension Brother    Heart disease Brother    Heart attack Brother    Breast cancer Maternal Aunt     Social History    Socioeconomic History   Marital status: Single    Spouse name: Not on file   Number of children: 1   Years of education: Not on file   Highest education level: Not on file  Occupational History   Occupation: Retired    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  Tobacco Use   Smoking status: Former    Packs/day: 0.10    Types: Cigarettes    Quit date: 10/2014    Years since quitting: 7.6    Passive exposure: Never   Smokeless tobacco: Never   Tobacco comments:    pt states that she is using the E cig only   Vaping Use   Vaping Use: Some days  Substance and Sexual Activity   Alcohol use: Yes    Alcohol/week: 0.0 - 1.0 standard drinks of alcohol    Comment: Occasional; 10-22 rarely    Drug use: No   Sexual activity: Not on file  Other Topics Concern   Not on file  Social History Narrative   Drinks about 2 cups of coffee a day, and some tea    Social Determinants of Health   Financial Resource Strain: Not on file  Food Insecurity: Not on file  Transportation Needs: Not on file  Physical Activity: Not on file  Stress: Not on  file  Social Connections: Not on file  Intimate Partner Violence: Not on file    Allergies  Allergen Reactions   Wound Dressing Adhesive Other (See Comments) and Rash    Pt states that tape and electrodes leave red "scars" on her skin and her skin is very sensitive.  Paper tape ok to use     Current Outpatient Medications  Medication Sig Dispense Refill   amLODipine (NORVASC) 10 MG tablet Take 10 mg by mouth daily.     aspirin EC 81 MG tablet Take 81 mg by mouth at bedtime.     Continuous Blood Gluc Sensor (FREESTYLE LIBRE 2 SENSOR) MISC 1 Device by Does not apply route every 14 (fourteen) days. 6 each 3   diclofenac Sodium (VOLTAREN) 1 % GEL Apply 1 application. topically daily as needed (leg pain).     DULoxetine (CYMBALTA) 60 MG capsule Take 60 mg by mouth daily.     ferrous sulfate 325 (65 FE) MG EC tablet Take 325 mg by mouth daily. (Patient not taking: Reported  on 05/09/2022)     gabapentin (NEURONTIN) 600 MG tablet Take 600 mg by mouth 3 (three) times daily.     insulin aspart (NOVOLOG FLEXPEN) 100 UNIT/ML FlexPen Inject 4-8 Units into the skin 3 (three) times daily with meals. 15 mL 11   Insulin Pen Needle 32G X 4 MM MISC Use 2x a day 200 each 3   latanoprost (XALATAN) 0.005 % ophthalmic solution 1 drop at bedtime.     lipase/protease/amylase 24000-76000 units CPEP Take 1 capsule (24,000 Units total) by mouth 3 (three) times daily before meals. (Patient taking differently: Take 24,000 Units by mouth 3 (three) times daily before meals. Take 1 tablet before meals and 1 tablet after meals and 1 tablet with a snack) 270 capsule 0   lovastatin (MEVACOR) 20 MG tablet Take 20 mg by mouth at bedtime.     metFORMIN (GLUCOPHAGE-XR) 500 MG 24 hr tablet Take 4 tablets (2,000 mg total) by mouth daily. (Patient taking differently: Take 500 mg by mouth 3 (three) times daily.) 360 tablet 3   metoprolol succinate (TOPROL-XL) 50 MG 24 hr tablet Take 50 mg by mouth daily. Take with or immediately following a meal.     Multiple Vitamins-Minerals (CENTRUM SILVER 50+WOMEN) TABS Take 1 tablet by mouth daily.     Netarsudil Dimesylate (RHOPRESSA) 0.02 % SOLN Rhopressa 0.02 % eye drops     Netarsudil-Latanoprost (ROCKLATAN) 0.02-0.005 % SOLN Place 1 drop into both eyes daily. (Patient not taking: Reported on 05/09/2022)     pantoprazole (PROTONIX) 40 MG tablet Take 40 mg by mouth daily.     polyethylene glycol (MIRALAX / GLYCOLAX) 17 g packet Take 17 g by mouth daily as needed. (Patient not taking: Reported on 05/09/2022) 14 each 0   sertraline (ZOLOFT) 100 MG tablet Take 100 mg by mouth daily. (Patient not taking: Reported on 05/09/2022)     temazepam (RESTORIL) 30 MG capsule Take 30 mg by mouth at bedtime as needed for sleep.  0   timolol (TIMOPTIC) 0.5 % ophthalmic solution Place 1 drop into both eyes every morning.     No current facility-administered medications for this  visit.    PHYSICAL EXAM There were no vitals filed for this visit.  Constitutional: *** appearing. *** distress. Appears *** nourished.  Neurologic: CN ***. *** focal findings. *** sensory loss. Psychiatric: *** Mood and affect symmetric and appropriate. Eyes: *** No icterus. No conjunctival pallor. Ears, nose, throat: ***  mucous membranes moist. Midline trachea.  Cardiac: *** rate and rhythm.  Respiratory: *** unlabored. Abdominal: *** soft, non-tender, non-distended.  Peripheral vascular: *** Extremity: *** edema. *** cyanosis. *** pallor.  Skin: *** gangrene. *** ulceration.  Lymphatic: *** Stemmer's sign. *** palpable lymphadenopathy.    PERTINENT LABORATORY AND RADIOLOGIC DATA  Most recent CBC    Latest Ref Rng & Units 01/31/2022    1:05 AM 01/28/2022    3:25 AM 01/26/2022    1:24 AM  CBC  WBC 4.0 - 10.5 K/uL 4.1  4.4  6.4   Hemoglobin 12.0 - 15.0 g/dL 10.1  11.4  12.4   Hematocrit 36.0 - 46.0 % 29.8  33.9  36.3   Platelets 150 - 400 K/uL 278  302  352      Most recent CMP    Latest Ref Rng & Units 04/20/2022    4:53 PM 01/31/2022    1:05 AM 01/29/2022   12:15 AM  CMP  Glucose 65 - 99 mg/dL 140  238  137   BUN 8 - 23 mg/dL  <5  <5   Creatinine 0.44 - 1.00 mg/dL  0.70  0.63   Sodium 135 - 145 mmol/L  138  136   Potassium 3.5 - 5.1 mmol/L  3.3  3.6   Chloride 98 - 111 mmol/L  106  103   CO2 22 - 32 mmol/L  25  22   Calcium 8.9 - 10.3 mg/dL  8.0  8.4   Total Protein 6.5 - 8.1 g/dL  4.8    Total Bilirubin 0.3 - 1.2 mg/dL  <0.1    Alkaline Phos 38 - 126 U/L  50    AST 15 - 41 U/L  21    ALT 0 - 44 U/L  14      Renal function CrCl cannot be calculated (Patient's most recent lab result is older than the maximum 21 days allowed.).  Hemoglobin A1C  Date Value  04/20/2022 7.0 % (A)  05/13/2020 8.5    LDL Chol Calc (NIH)  Date Value Ref Range Status  02/08/2022 21 0 - 99 mg/dL Final     Vascular Imaging: ***  Domonic Kimball N. Stanford Breed, MD Vascular and Vein  [facility] Phone Number: [phone] 06/18/2022 3:53 PM  Total time spent on preparing this encounter including chart review, data review, collecting history, examining the patient, coordinating care for this {tnhtimebilling:26202}  Portions of this report may have been transcribed using voice recognition software.  Every effort has been made to ensure accuracy; however, inadvertent computerized transcription errors may still be present.

## 2022-06-19 ENCOUNTER — Encounter: Payer: Self-pay | Admitting: Vascular Surgery

## 2022-06-19 ENCOUNTER — Ambulatory Visit (INDEPENDENT_AMBULATORY_CARE_PROVIDER_SITE_OTHER): Payer: No Typology Code available for payment source | Admitting: Vascular Surgery

## 2022-06-19 VITALS — BP 139/80 | HR 56 | Temp 97.9°F | Resp 20 | Ht 63.0 in | Wt 116.0 lb

## 2022-06-19 DIAGNOSIS — G8929 Other chronic pain: Secondary | ICD-10-CM

## 2022-06-19 DIAGNOSIS — R1013 Epigastric pain: Secondary | ICD-10-CM | POA: Diagnosis not present

## 2022-06-21 ENCOUNTER — Other Ambulatory Visit: Payer: Self-pay

## 2022-06-21 DIAGNOSIS — R1013 Epigastric pain: Secondary | ICD-10-CM

## 2022-06-26 DIAGNOSIS — Z8249 Family history of ischemic heart disease and other diseases of the circulatory system: Secondary | ICD-10-CM | POA: Diagnosis not present

## 2022-06-26 DIAGNOSIS — Z7984 Long term (current) use of oral hypoglycemic drugs: Secondary | ICD-10-CM | POA: Diagnosis not present

## 2022-06-26 DIAGNOSIS — R109 Unspecified abdominal pain: Secondary | ICD-10-CM | POA: Diagnosis not present

## 2022-06-26 DIAGNOSIS — Z7982 Long term (current) use of aspirin: Secondary | ICD-10-CM | POA: Diagnosis not present

## 2022-06-26 DIAGNOSIS — Z794 Long term (current) use of insulin: Secondary | ICD-10-CM | POA: Diagnosis not present

## 2022-06-26 DIAGNOSIS — E1151 Type 2 diabetes mellitus with diabetic peripheral angiopathy without gangrene: Secondary | ICD-10-CM | POA: Diagnosis not present

## 2022-06-26 DIAGNOSIS — R231 Pallor: Secondary | ICD-10-CM | POA: Diagnosis not present

## 2022-06-26 DIAGNOSIS — K8681 Exocrine pancreatic insufficiency: Secondary | ICD-10-CM | POA: Diagnosis not present

## 2022-06-26 DIAGNOSIS — K529 Noninfective gastroenteritis and colitis, unspecified: Secondary | ICD-10-CM | POA: Diagnosis not present

## 2022-06-26 DIAGNOSIS — F17210 Nicotine dependence, cigarettes, uncomplicated: Secondary | ICD-10-CM | POA: Diagnosis not present

## 2022-06-26 DIAGNOSIS — K859 Acute pancreatitis without necrosis or infection, unspecified: Secondary | ICD-10-CM | POA: Diagnosis not present

## 2022-06-26 DIAGNOSIS — E119 Type 2 diabetes mellitus without complications: Secondary | ICD-10-CM | POA: Diagnosis not present

## 2022-06-26 DIAGNOSIS — K8689 Other specified diseases of pancreas: Secondary | ICD-10-CM | POA: Diagnosis not present

## 2022-06-26 DIAGNOSIS — E785 Hyperlipidemia, unspecified: Secondary | ICD-10-CM | POA: Diagnosis not present

## 2022-06-26 DIAGNOSIS — I10 Essential (primary) hypertension: Secondary | ICD-10-CM | POA: Diagnosis not present

## 2022-06-26 DIAGNOSIS — I739 Peripheral vascular disease, unspecified: Secondary | ICD-10-CM | POA: Diagnosis not present

## 2022-06-26 DIAGNOSIS — M47816 Spondylosis without myelopathy or radiculopathy, lumbar region: Secondary | ICD-10-CM | POA: Diagnosis not present

## 2022-06-26 DIAGNOSIS — Z833 Family history of diabetes mellitus: Secondary | ICD-10-CM | POA: Diagnosis not present

## 2022-06-26 DIAGNOSIS — K861 Other chronic pancreatitis: Secondary | ICD-10-CM | POA: Diagnosis not present

## 2022-06-26 DIAGNOSIS — R1084 Generalized abdominal pain: Secondary | ICD-10-CM | POA: Diagnosis not present

## 2022-06-27 DIAGNOSIS — I10 Essential (primary) hypertension: Secondary | ICD-10-CM | POA: Diagnosis not present

## 2022-06-27 DIAGNOSIS — Z794 Long term (current) use of insulin: Secondary | ICD-10-CM | POA: Diagnosis not present

## 2022-06-27 DIAGNOSIS — R079 Chest pain, unspecified: Secondary | ICD-10-CM | POA: Diagnosis not present

## 2022-06-27 DIAGNOSIS — K861 Other chronic pancreatitis: Secondary | ICD-10-CM | POA: Diagnosis not present

## 2022-06-27 DIAGNOSIS — E119 Type 2 diabetes mellitus without complications: Secondary | ICD-10-CM | POA: Diagnosis not present

## 2022-06-27 DIAGNOSIS — K859 Acute pancreatitis without necrosis or infection, unspecified: Secondary | ICD-10-CM | POA: Diagnosis not present

## 2022-06-27 DIAGNOSIS — Z9049 Acquired absence of other specified parts of digestive tract: Secondary | ICD-10-CM | POA: Diagnosis not present

## 2022-06-28 DIAGNOSIS — E119 Type 2 diabetes mellitus without complications: Secondary | ICD-10-CM | POA: Diagnosis not present

## 2022-06-28 DIAGNOSIS — K861 Other chronic pancreatitis: Secondary | ICD-10-CM | POA: Diagnosis not present

## 2022-06-28 DIAGNOSIS — Z794 Long term (current) use of insulin: Secondary | ICD-10-CM | POA: Diagnosis not present

## 2022-06-28 DIAGNOSIS — K859 Acute pancreatitis without necrosis or infection, unspecified: Secondary | ICD-10-CM | POA: Diagnosis not present

## 2022-06-28 DIAGNOSIS — I10 Essential (primary) hypertension: Secondary | ICD-10-CM | POA: Diagnosis not present

## 2022-06-29 ENCOUNTER — Telehealth: Payer: Self-pay

## 2022-06-29 DIAGNOSIS — E119 Type 2 diabetes mellitus without complications: Secondary | ICD-10-CM | POA: Diagnosis not present

## 2022-06-29 DIAGNOSIS — K861 Other chronic pancreatitis: Secondary | ICD-10-CM | POA: Diagnosis not present

## 2022-06-29 DIAGNOSIS — K859 Acute pancreatitis without necrosis or infection, unspecified: Secondary | ICD-10-CM | POA: Diagnosis not present

## 2022-06-29 DIAGNOSIS — I10 Essential (primary) hypertension: Secondary | ICD-10-CM | POA: Diagnosis not present

## 2022-06-29 NOTE — Telephone Encounter (Signed)
Pt called stating that she was in the hospital in [facility] with pancreatitis and wanted to make Dr Stanford Breed aware in case he wanted to proceed with the mesenteric artery stent after viewing CT.  Staff msg sent to Dr Stanford Breed.

## 2022-07-04 DIAGNOSIS — K859 Acute pancreatitis without necrosis or infection, unspecified: Secondary | ICD-10-CM | POA: Diagnosis not present

## 2022-07-04 DIAGNOSIS — K861 Other chronic pancreatitis: Secondary | ICD-10-CM | POA: Diagnosis not present

## 2022-07-04 DIAGNOSIS — Z794 Long term (current) use of insulin: Secondary | ICD-10-CM | POA: Diagnosis not present

## 2022-07-04 DIAGNOSIS — E119 Type 2 diabetes mellitus without complications: Secondary | ICD-10-CM | POA: Diagnosis not present

## 2022-07-04 DIAGNOSIS — I739 Peripheral vascular disease, unspecified: Secondary | ICD-10-CM | POA: Diagnosis not present

## 2022-07-04 DIAGNOSIS — I10 Essential (primary) hypertension: Secondary | ICD-10-CM | POA: Diagnosis not present

## 2022-07-04 DIAGNOSIS — Z7984 Long term (current) use of oral hypoglycemic drugs: Secondary | ICD-10-CM | POA: Diagnosis not present

## 2022-07-07 DIAGNOSIS — I10 Essential (primary) hypertension: Secondary | ICD-10-CM | POA: Diagnosis not present

## 2022-07-07 DIAGNOSIS — E119 Type 2 diabetes mellitus without complications: Secondary | ICD-10-CM | POA: Diagnosis not present

## 2022-07-07 DIAGNOSIS — K859 Acute pancreatitis without necrosis or infection, unspecified: Secondary | ICD-10-CM | POA: Diagnosis not present

## 2022-07-07 DIAGNOSIS — K861 Other chronic pancreatitis: Secondary | ICD-10-CM | POA: Diagnosis not present

## 2022-07-07 DIAGNOSIS — Z7984 Long term (current) use of oral hypoglycemic drugs: Secondary | ICD-10-CM | POA: Diagnosis not present

## 2022-07-07 DIAGNOSIS — Z794 Long term (current) use of insulin: Secondary | ICD-10-CM | POA: Diagnosis not present

## 2022-07-07 DIAGNOSIS — I739 Peripheral vascular disease, unspecified: Secondary | ICD-10-CM | POA: Diagnosis not present

## 2022-07-12 DIAGNOSIS — F419 Anxiety disorder, unspecified: Secondary | ICD-10-CM | POA: Diagnosis not present

## 2022-07-12 DIAGNOSIS — R634 Abnormal weight loss: Secondary | ICD-10-CM | POA: Diagnosis not present

## 2022-07-12 DIAGNOSIS — E7800 Pure hypercholesterolemia, unspecified: Secondary | ICD-10-CM | POA: Diagnosis not present

## 2022-07-12 DIAGNOSIS — K859 Acute pancreatitis without necrosis or infection, unspecified: Secondary | ICD-10-CM | POA: Diagnosis not present

## 2022-07-12 DIAGNOSIS — I10 Essential (primary) hypertension: Secondary | ICD-10-CM | POA: Diagnosis not present

## 2022-07-12 DIAGNOSIS — Z09 Encounter for follow-up examination after completed treatment for conditions other than malignant neoplasm: Secondary | ICD-10-CM | POA: Diagnosis not present

## 2022-07-17 ENCOUNTER — Ambulatory Visit (HOSPITAL_COMMUNITY)
Admission: RE | Admit: 2022-07-17 | Discharge: 2022-07-17 | Disposition: A | Discharge status: Home / Self Care | Payer: No Typology Code available for payment source | Source: Ambulatory Visit | Attending: Vascular Surgery | Admitting: Vascular Surgery

## 2022-07-17 DIAGNOSIS — R1013 Epigastric pain: Secondary | ICD-10-CM | POA: Diagnosis not present

## 2022-07-17 DIAGNOSIS — Z9049 Acquired absence of other specified parts of digestive tract: Secondary | ICD-10-CM | POA: Diagnosis not present

## 2022-07-17 DIAGNOSIS — G8929 Other chronic pain: Secondary | ICD-10-CM | POA: Diagnosis not present

## 2022-07-17 MED ORDER — IOHEXOL 350 MG/ML SOLN
80.0000 mL | Freq: Once | INTRAVENOUS | Status: AC | PRN
  Administered 2022-07-17: 80 mL via INTRAVENOUS

## 2022-07-20 ENCOUNTER — Ambulatory Visit: Payer: No Typology Code available for payment source | Admitting: Vascular Surgery

## 2022-07-21 DIAGNOSIS — K861 Other chronic pancreatitis: Secondary | ICD-10-CM | POA: Diagnosis not present

## 2022-07-21 DIAGNOSIS — Z7984 Long term (current) use of oral hypoglycemic drugs: Secondary | ICD-10-CM | POA: Diagnosis not present

## 2022-07-21 DIAGNOSIS — K859 Acute pancreatitis without necrosis or infection, unspecified: Secondary | ICD-10-CM | POA: Diagnosis not present

## 2022-07-21 DIAGNOSIS — I739 Peripheral vascular disease, unspecified: Secondary | ICD-10-CM | POA: Diagnosis not present

## 2022-07-21 DIAGNOSIS — Z794 Long term (current) use of insulin: Secondary | ICD-10-CM | POA: Diagnosis not present

## 2022-07-21 DIAGNOSIS — E119 Type 2 diabetes mellitus without complications: Secondary | ICD-10-CM | POA: Diagnosis not present

## 2022-07-21 DIAGNOSIS — I10 Essential (primary) hypertension: Secondary | ICD-10-CM | POA: Diagnosis not present

## 2022-08-17 ENCOUNTER — Ambulatory Visit (INDEPENDENT_AMBULATORY_CARE_PROVIDER_SITE_OTHER): Payer: No Typology Code available for payment source | Admitting: Internal Medicine

## 2022-08-17 ENCOUNTER — Encounter: Payer: Self-pay | Admitting: Internal Medicine

## 2022-08-17 VITALS — BP 120/72 | HR 59 | Ht 63.0 in | Wt 115.0 lb

## 2022-08-17 DIAGNOSIS — E1151 Type 2 diabetes mellitus with diabetic peripheral angiopathy without gangrene: Secondary | ICD-10-CM | POA: Diagnosis not present

## 2022-08-17 DIAGNOSIS — E7800 Pure hypercholesterolemia, unspecified: Secondary | ICD-10-CM | POA: Diagnosis not present

## 2022-08-17 LAB — POCT GLYCOSYLATED HEMOGLOBIN (HGB A1C): Hemoglobin A1C: 7 % — AB (ref 4.0–5.6)

## 2022-08-17 MED ORDER — METFORMIN HCL ER 500 MG PO TB24: 500.0000 mg | ORAL_TABLET | Freq: Two times a day (BID) | ORAL | 3 refills | Status: DC

## 2022-08-17 NOTE — Progress Notes (Signed)
Patient ID: Jill Higgins, female   DOB: 01/15/1946, 76 y.o.   MRN: [id number]  HPI: Jill Higgins is a 76 y.o.-year-old female, returning for follow-up for DM2, dx in 2013, non-insulin-dependent (but previously on insulin in 2021), uncontrolled, with complications (CAD, PAD). Pt. previously saw Dr. Loanne Drilling, last visit with me 4 months ago.  Interim history: No increased urination, blurry vision, nausea, chest pain. She was hospitalized with severe abdominal pain and was found to have pancreatitis 06/2022. She lost weight around that time.  He still has AP radiating to the back. She started Tramadol.  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  Reviewed HbA1c: 06/26/2022: HbA1c 8.5%  Lab Results  Component Value Date   HGBA1C 7.0 (A) 04/20/2022   HGBA1C 6.3 (A) 01/23/2022   HGBA1C 6.8 (A) 10/24/2021   HGBA1C 7.5 (A) 07/18/2021   HGBA1C 7.9 (A) 02/01/2021   HGBA1C 8.1 (A) 12/03/2020   HGBA1C 9.5 (A) 11/02/2020   HGBA1C 8.3 (A) 08/10/2020   HGBA1C 8.5 05/13/2020   HGBA1C 8.5 (A) 03/30/2020   Pt is on a regimen of: - Metformin ER 1000 mg 2x a day, with meals - Repaglinide 4 mg  2x a day >> NovoLog 4-8 units 2x a day She was previously on insulin  01/2022. She had previous hypoglycemia with glipizide.  Pt checks her sugars >4x a day with the Libre 2 CGM:  Previously:   Lowest blood sugars: 54 - pm; she has hypoglycemia awareness at 70.  Highest sugar was 300s (hand surgery).  Glucometer: Accu-Chek guide  Pt's meals - 2x a day.  - no CKD, last BUN/creatinine:  Lab Results  Component Value Date   BUN <5 (L) 01/31/2022   BUN <5 (L) 01/29/2022   CREATININE 0.70 01/31/2022   CREATININE 0.63 01/29/2022  She is not on ACE inhibitor/ARB.  -+ HL; last set of lipids: Lab Results  Component Value Date    CHOL 99 (L) 02/08/2022   HDL 62 02/08/2022   LDLCALC 21 02/08/2022   TRIG 77 02/08/2022   CHOLHDL 1.6 02/08/2022  On Mevacor 10 mg daily  - last eye exam was in 03/2022. No DR reportedly. + Glaucoma. Dr. Sherral Hammers.  - + numbness and tingling in her feet.  Last foot exam July 18, 2021.  On Gabapentin.  She has a history of HTN, overactive bladder, depression/anxiety, anemia-on iron 325 mg daily  ROS: + see HPI  Past Medical History:  Diagnosis Date   Bronchitis    Bulging disc    in neck   Depression with anxiety    Diabetes mellitus    Type II   Fibromyalgia    pt. denies   GERD (gastroesophageal reflux disease)    Glaucoma    History of bronchitis    Hyperlipidemia    Hypertension    Overactive bladder    Peripheral vascular disease (HCC)    Pneumonia    Sleep apnea    recent test negative for sleep apnea   Tobacco abuse    Urinary frequency    Past Surgical History:  Procedure Laterality Date   ABDOMINAL AORTAGRAM N/A 08/15/2013   Procedure: ABDOMINAL Maxcine Ham;  Surgeon: Elam Dutch, MD;  Location: [facility] CATH LAB;  Service: Cardiovascular;  Laterality: N/A;   ABDOMINAL AORTAGRAM N/A 06/19/2014   Procedure: ABDOMINAL Maxcine Ham;  Surgeon: Juanda Crumble  Antony Blackbird, MD;  Location: [place] CATH LAB;  Service: Cardiovascular;  Laterality: N/A;   ABDOMINAL AORTOGRAM W/LOWER EXTREMITY N/A 05/10/2018   Procedure: ABDOMINAL AORTOGRAM W/LOWER EXTREMITY;  Surgeon: Elam Dutch, MD;  Location: [place] CV LAB;  Service: Cardiovascular;  Laterality: N/A;  bilateral   Aortogram w/ PTA  05/14/08,  11-04-10   Bilateral aortogram w/ bilateral  SFA PTA  stenting    BREAST SURGERY Right    boil removal   BUNIONECTOMY     L foot in the 1980s   COLONOSCOPY     2014   DILATION AND CURETTAGE OF UTERUS     X4   ENDARTERECTOMY FEMORAL Right 06/03/2018   Procedure: RIGHT FEMORAL ENDARTERECTOMY;  Surgeon: Elam Dutch, MD;  Location: [place];  Service: Vascular;  Laterality: Right;    Epidural shots in neck      ESOPHAGOGASTRODUODENOSCOPY (EGD) WITH PROPOFOL N/A 09/15/2019   Procedure: ESOPHAGOGASTRODUODENOSCOPY (EGD) WITH PROPOFOL;  Surgeon: Arta Silence, MD;  Location: [facility] ENDOSCOPY;  Service: Endoscopy;  Laterality: N/A;   EUS N/A 09/15/2019   Procedure: UPPER ENDOSCOPIC ULTRASOUND (EUS) LINEAR;  Surgeon: Arta Silence, MD;  Location: [facility] ENDOSCOPY;  Service: Endoscopy;  Laterality: N/A;   EYE SURGERY Bilateral 2016   cataract removal   FEMORAL-POPLITEAL BYPASS GRAFT Left 10/21/2013   Procedure: LEFT FEMORAL-POPLITEAL ARTERY BYPASS WITH SAPHENOUS VEIN GRAFT , POPLITEAL ENDARTERECTOMY ,INTRAOPERATIVE ARTERIOGRAM, vein patch angioplasty to popliteal artery;  Surgeon: Elam Dutch, MD;  Location: [facility] OR;  Service: Vascular;  Laterality: Left;   FEMORAL-POPLITEAL BYPASS GRAFT Right 02/08/2016   Procedure: Right FEMORAL- to Above Knee POPLITEAL ARTERY Bypass Graft with reversed saphenous vein and Common Femoral Endarterectomy  with profundoplasty;  Surgeon: Elam Dutch, MD;  Location: [place];  Service: Vascular;  Laterality: Right;   FINE NEEDLE ASPIRATION N/A 09/15/2019   Procedure: FINE NEEDLE ASPIRATION (FNA) LINEAR;  Surgeon: Arta Silence, MD;  Location: [facility] ENDOSCOPY;  Service: Endoscopy;  Laterality: N/A;   GROIN DEBRIDEMENT Left 10/28/2013   Procedure: left inner thigh DEBRIDEMENT;  Surgeon: Elam Dutch, MD;  Location: [facility] OR;  Service: Vascular;  Laterality: Left;   HAND SURGERY Left 2010   LOWER EXTREMITY ANGIOGRAM Bilateral 12/24/2015   Procedure: Lower Extremity Angiogram;  Surgeon: Elam Dutch, MD;  Location: [place] CV LAB;  Service: Cardiovascular;  Laterality: Bilateral;   PATCH ANGIOPLASTY Right 06/03/2018   Procedure: PATCH ANGIOPLASTY USING A XENOSURE 1CM X 14CM BIOLOGIC PATCH;  Surgeon: Elam Dutch, MD;  Location: [facility] OR;  Service: Vascular;  Laterality: Right;   PERIPHERAL VASCULAR CATHETERIZATION N/A 12/24/2015   Procedure: Abdominal  Aortogram;  Surgeon: Elam Dutch, MD;  Location: [place] CV LAB;  Service: Cardiovascular;  Laterality: N/A;   PERIPHERAL VASCULAR CATHETERIZATION Left 12/24/2015   Procedure: Peripheral Vascular Balloon Angioplasty;  Surgeon: Elam Dutch, MD;  Location: [place] CV LAB;  Service: Cardiovascular;  Laterality: Left;  drug coated balloon   PERIPHERAL VASCULAR CATHETERIZATION N/A 06/30/2016   Procedure: Abdominal Aortogram;  Surgeon: Elam Dutch, MD;  Location: [place] CV LAB;  Service: Cardiovascular;  Laterality: N/A;   PERIPHERAL VASCULAR CATHETERIZATION Bilateral 06/30/2016   Procedure: Lower Extremity Angiography;  Surgeon: Elam Dutch, MD;  Location: [place] CV LAB;  Service: Cardiovascular;  Laterality: Bilateral;   TONSILLECTOMY     Social History   Socioeconomic History   Marital status: Single    Spouse name: Not on file   Number of children: 1  Years of education: Not on file   Highest education level: Not on file  Occupational History   Occupation: Retired    Fish farm manager: [employer]  Tobacco Use   Smoking status: Former    Packs/day: 0.10    Types: Cigarettes    Quit date: 10/2014    Years since quitting: 7.8    Passive exposure: Never   Smokeless tobacco: Never   Tobacco comments:    pt states that she is using the E cig only   Vaping Use   Vaping Use: Some days  Substance and Sexual Activity   Alcohol use: Yes    Alcohol/week: 0.0 - 1.0 standard drinks of alcohol    Comment: Occasional; 10-22 rarely    Drug use: No   Sexual activity: Not on file  Other Topics Concern   Not on file  Social History Narrative   Drinks about 2 cups of coffee a day, and some tea    Social Determinants of Health   Financial Resource Strain: Not on file  Food Insecurity: Not on file  Transportation Needs: Not on file  Physical Activity: Not on file  Stress: Not on file  Social Connections: Not on file  Intimate Partner Violence: Not on file   Current  Outpatient Medications on File Prior to Visit  Medication Sig Dispense Refill   amLODipine (NORVASC) 10 MG tablet Take 10 mg by mouth daily.     aspirin EC 81 MG tablet Take 81 mg by mouth at bedtime.     Continuous Blood Gluc Sensor (FREESTYLE LIBRE 2 SENSOR) MISC 1 Device by Does not apply route every 14 (fourteen) days. 6 each 3   diclofenac Sodium (VOLTAREN) 1 % GEL Apply 1 application. topically daily as needed (leg pain).     DULoxetine (CYMBALTA) 60 MG capsule Take 60 mg by mouth daily.     ferrous sulfate 325 (65 FE) MG EC tablet Take 325 mg by mouth daily.     gabapentin (NEURONTIN) 600 MG tablet Take 600 mg by mouth 3 (three) times daily.     insulin aspart (NOVOLOG FLEXPEN) 100 UNIT/ML FlexPen Inject 4-8 Units into the skin 3 (three) times daily with meals. 15 mL 11   Insulin Pen Needle 32G X 4 MM MISC Use 2x a day 200 each 3   latanoprost (XALATAN) 0.005 % ophthalmic solution 1 drop at bedtime.     lipase/protease/amylase 24000-76000 units CPEP Take 1 capsule (24,000 Units total) by mouth 3 (three) times daily before meals. (Patient taking differently: Take 24,000 Units by mouth 3 (three) times daily before meals. Take 1 tablet before meals and 1 tablet after meals and 1 tablet with a snack) 270 capsule 0   lovastatin (MEVACOR) 20 MG tablet Take 20 mg by mouth at bedtime.     metFORMIN (GLUCOPHAGE-XR) 500 MG 24 hr tablet Take 4 tablets (2,000 mg total) by mouth daily. (Patient taking differently: Take 500 mg by mouth 3 (three) times daily.) 360 tablet 3   metoprolol succinate (TOPROL-XL) 50 MG 24 hr tablet Take 50 mg by mouth daily. Take with or immediately following a meal.     Multiple Vitamins-Minerals (CENTRUM SILVER 50+WOMEN) TABS Take 1 tablet by mouth daily.     Netarsudil Dimesylate (RHOPRESSA) 0.02 % SOLN Rhopressa 0.02 % eye drops     Netarsudil-Latanoprost (ROCKLATAN) 0.02-0.005 % SOLN Place 1 drop into both eyes daily.     pantoprazole (PROTONIX) 40 MG tablet Take 40 mg by  mouth daily.  polyethylene glycol (MIRALAX / GLYCOLAX) 17 g packet Take 17 g by mouth daily as needed. 14 each 0   temazepam (RESTORIL) 30 MG capsule Take 30 mg by mouth at bedtime as needed for sleep.  0   timolol (TIMOPTIC) 0.5 % ophthalmic solution Place 1 drop into both eyes every morning.     No current facility-administered medications on file prior to visit.   Allergies  Allergen Reactions   Wound Dressing Adhesive Other (See Comments) and Rash    Pt states that tape and electrodes leave red "scars" on her skin and her skin is very sensitive.  Paper tape ok to use    Family History  Problem Relation Age of Onset   Heart disease Father    Heart attack Father        MI at age 35   Hypertension Mother    Alzheimer's disease Mother    Hypertension Sister    Diabetes Brother    Hyperlipidemia Brother    Hypertension Brother    Heart disease Brother    Heart attack Brother    Breast cancer Maternal Aunt    PE: BP 120/72 (BP Location: Right Arm, Patient Position: Sitting, Cuff Size: Normal)   Pulse (!) 59   Ht '5\' 3"'$  (1.6 m)   Wt 115 lb (52.2 kg)   SpO2 94%   BMI 20.37 kg/m  Wt Readings from Last 3 Encounters:  08/17/22 115 lb (52.2 kg)  06/19/22 116 lb (52.6 kg)  05/09/22 117 lb (53.1 kg)   Constitutional: thin, in NAD Eyes: EOMI, no exophthalmos ENT: no thyromegaly, no cervical lymphadenopathy Cardiovascular: RRR, No MRG Respiratory: CTA B Musculoskeletal: no deformities Skin:  no rashes Neurological: no tremor with outstretched hands Diabetic Foot Exam - Simple   Simple Foot Form Diabetic Foot exam was performed with the following findings: Yes 08/17/2022  1:27 PM  Visual Inspection No deformities, no ulcerations, no other skin breakdown bilaterally: Yes Sensation Testing Intact to touch and monofilament testing bilaterally: Yes Pulse Check Posterior Tibialis and Dorsalis pulse intact bilaterally: Yes Comments + LE swelling - B - pitting     ASSESSMENT: 1. DM2, non-insulin-dependent, controlled, with complications - CAD - PAD - s/p stents  Component     Latest Ref Rng 04/20/2022  Glucose     65 - 99 mg/dL 140 (H)   C-Peptide     0.80 - 3.85 ng/mL 1.50     2.  Hyperlipidemia  PLAN:  1. Patient with longstanding, uncontrolled, type 2 diabetes, on oral antidiabetic regimen, worsening control after stopping insulin at her last visit with Dr. Loanne Drilling.  When I last saw her 4 months ago, her sugars were fluctuating between 58 and 300s.  We discussed that this was a sign of insulin deficiency.  Her C-peptide was not low, however, she may have had selective postprandial insulin deficiency.  She does have a history of chronic pancreatitis and a history of pancreatectomy. -At last visit, we restarted NovoLog before the 2 main meals of the day.  I advised her to vary the dose of insulin based on the size and consistency of her meals.  I also advised her to take this 15 minutes before the dose.  We will start repaglinide when starting NovoLog.  We continued metformin. -She had an HbA1c obtained last month and this was higher, in the setting of acute pancreatitis CGM interpretation: -At today's visit, we reviewed her CGM downloads: It appears that 83% of values are in target  range (goal >70%), while 14% are higher than 180 (goal <25%), and she percent are lower than 70 (goal <4%).  The calculated average blood sugar is 131.  The projected HbA1c for the next 3 months (GMI) is 6.4%. -Reviewing the CGM trends, sugars appear to be fluctuating within the target range, with only rare hyperglycemic spikes especially after lunch and occasionally after dinner but also an occasional lower blood sugar at night or in the evening.  Upon review of individual daily traces and confirming with the patient, she is occasionally taking her NovoLog after she starts eating and I suspect that these are the times when her sugars are dropping after meals.  We discussed  about taking the insulin injections 10 to 15 minutes before each meal.  We also discussed about how to correct a high blood sugar safely.  She is very insulin sensitive.  I gave her a very lax sliding scale.  I will also advised her to decrease metformin mostly for concerns of decreased appetite and weight loss. - I suggested to:  Patient Instructions  Please reduce: - Metformin ER 500 mg 2x a day, with meals  Continue: - Novolog 2-7 units 2x a day 15 min before the meal  If the sugars are high between meals, you can correct with NovoLog: >200: + 1 unit >300: + 2 units >400 + 3 units  Please return in 3-4 months.  - we checked her HbA1c: 7.0% (lower) - advised to check sugars at different times of the day - 4x a day, rotating check times - advised for yearly eye exams >> she is UTD - return to clinic in 3-4 months  2. HL -Reviewed latest lipid panel from 01/2022: All fractions at goal: Lab Results  Component Value Date   CHOL 99 (L) 02/08/2022   HDL 62 02/08/2022   LDLCALC 21 02/08/2022   TRIG 77 02/08/2022   CHOLHDL 1.6 02/08/2022  -she continues on lovastatin 20 mg daily with no side effects  + flu shot today  Philemon Kingdom, MD PhD [facility] Endocrinology

## 2022-08-17 NOTE — Patient Instructions (Addendum)
Please reduce: - Metformin ER 500 mg 2x a day, with meals  Continue: - Novolog 2-7 units 2x a day 15 min before the meal  If the sugars are high between meals, you can correct with NovoLog: >200: + 1 unit >300: + 2 units >400 + 3 units  Please return in 3-4 months.

## 2022-08-24 DIAGNOSIS — J449 Chronic obstructive pulmonary disease, unspecified: Secondary | ICD-10-CM | POA: Diagnosis not present

## 2022-08-24 DIAGNOSIS — G4700 Insomnia, unspecified: Secondary | ICD-10-CM | POA: Diagnosis not present

## 2022-08-24 DIAGNOSIS — K219 Gastro-esophageal reflux disease without esophagitis: Secondary | ICD-10-CM | POA: Diagnosis not present

## 2022-08-24 DIAGNOSIS — E7800 Pure hypercholesterolemia, unspecified: Secondary | ICD-10-CM | POA: Diagnosis not present

## 2022-08-24 DIAGNOSIS — E1165 Type 2 diabetes mellitus with hyperglycemia: Secondary | ICD-10-CM | POA: Diagnosis not present

## 2022-08-24 DIAGNOSIS — I10 Essential (primary) hypertension: Secondary | ICD-10-CM | POA: Diagnosis not present

## 2022-08-29 DIAGNOSIS — E119 Type 2 diabetes mellitus without complications: Secondary | ICD-10-CM | POA: Diagnosis not present

## 2022-08-29 DIAGNOSIS — H35033 Hypertensive retinopathy, bilateral: Secondary | ICD-10-CM | POA: Diagnosis not present

## 2022-08-29 DIAGNOSIS — H401133 Primary open-angle glaucoma, bilateral, severe stage: Secondary | ICD-10-CM | POA: Diagnosis not present

## 2022-08-29 DIAGNOSIS — H53433 Sector or arcuate defects, bilateral: Secondary | ICD-10-CM | POA: Diagnosis not present

## 2022-10-02 ENCOUNTER — Other Ambulatory Visit: Payer: Self-pay

## 2022-10-02 DIAGNOSIS — E1151 Type 2 diabetes mellitus with diabetic peripheral angiopathy without gangrene: Secondary | ICD-10-CM

## 2022-10-02 MED ORDER — FREESTYLE LIBRE 2 SENSOR MISC: 1.0000 | 3 refills | Status: DC

## 2022-10-10 DIAGNOSIS — R634 Abnormal weight loss: Secondary | ICD-10-CM | POA: Diagnosis not present

## 2022-10-10 DIAGNOSIS — Z9181 History of falling: Secondary | ICD-10-CM | POA: Diagnosis not present

## 2022-10-10 DIAGNOSIS — R29898 Other symptoms and signs involving the musculoskeletal system: Secondary | ICD-10-CM | POA: Diagnosis not present

## 2022-10-10 DIAGNOSIS — N3941 Urge incontinence: Secondary | ICD-10-CM | POA: Diagnosis not present

## 2022-10-10 DIAGNOSIS — R159 Full incontinence of feces: Secondary | ICD-10-CM | POA: Diagnosis not present

## 2022-10-10 DIAGNOSIS — R531 Weakness: Secondary | ICD-10-CM | POA: Diagnosis not present

## 2022-10-24 DIAGNOSIS — R32 Unspecified urinary incontinence: Secondary | ICD-10-CM | POA: Diagnosis not present

## 2022-10-24 DIAGNOSIS — F419 Anxiety disorder, unspecified: Secondary | ICD-10-CM | POA: Diagnosis not present

## 2022-10-24 DIAGNOSIS — K219 Gastro-esophageal reflux disease without esophagitis: Secondary | ICD-10-CM | POA: Diagnosis not present

## 2022-10-24 DIAGNOSIS — G4700 Insomnia, unspecified: Secondary | ICD-10-CM | POA: Diagnosis not present

## 2022-10-24 DIAGNOSIS — E1169 Type 2 diabetes mellitus with other specified complication: Secondary | ICD-10-CM | POA: Diagnosis not present

## 2022-10-24 DIAGNOSIS — Z1331 Encounter for screening for depression: Secondary | ICD-10-CM | POA: Diagnosis not present

## 2022-10-24 DIAGNOSIS — E7800 Pure hypercholesterolemia, unspecified: Secondary | ICD-10-CM | POA: Diagnosis not present

## 2022-10-24 DIAGNOSIS — I739 Peripheral vascular disease, unspecified: Secondary | ICD-10-CM | POA: Diagnosis not present

## 2022-10-24 DIAGNOSIS — I10 Essential (primary) hypertension: Secondary | ICD-10-CM | POA: Diagnosis not present

## 2022-10-24 DIAGNOSIS — Z794 Long term (current) use of insulin: Secondary | ICD-10-CM | POA: Diagnosis not present

## 2022-10-24 DIAGNOSIS — J449 Chronic obstructive pulmonary disease, unspecified: Secondary | ICD-10-CM | POA: Diagnosis not present

## 2022-10-24 DIAGNOSIS — Z0000 Encounter for general adult medical examination without abnormal findings: Secondary | ICD-10-CM | POA: Diagnosis not present

## 2022-10-31 DIAGNOSIS — R634 Abnormal weight loss: Secondary | ICD-10-CM | POA: Diagnosis not present

## 2022-10-31 DIAGNOSIS — R197 Diarrhea, unspecified: Secondary | ICD-10-CM | POA: Diagnosis not present

## 2022-12-15 DIAGNOSIS — Z7984 Long term (current) use of oral hypoglycemic drugs: Secondary | ICD-10-CM | POA: Diagnosis not present

## 2022-12-15 DIAGNOSIS — H409 Unspecified glaucoma: Secondary | ICD-10-CM | POA: Diagnosis not present

## 2022-12-15 DIAGNOSIS — G4761 Periodic limb movement disorder: Secondary | ICD-10-CM | POA: Diagnosis not present

## 2022-12-15 DIAGNOSIS — R159 Full incontinence of feces: Secondary | ICD-10-CM | POA: Diagnosis not present

## 2022-12-15 DIAGNOSIS — G4733 Obstructive sleep apnea (adult) (pediatric): Secondary | ICD-10-CM | POA: Diagnosis not present

## 2022-12-15 DIAGNOSIS — I10 Essential (primary) hypertension: Secondary | ICD-10-CM | POA: Diagnosis not present

## 2022-12-15 DIAGNOSIS — K529 Noninfective gastroenteritis and colitis, unspecified: Secondary | ICD-10-CM | POA: Diagnosis not present

## 2022-12-15 DIAGNOSIS — K219 Gastro-esophageal reflux disease without esophagitis: Secondary | ICD-10-CM | POA: Diagnosis not present

## 2022-12-15 DIAGNOSIS — Z87891 Personal history of nicotine dependence: Secondary | ICD-10-CM | POA: Diagnosis not present

## 2022-12-15 DIAGNOSIS — Z9181 History of falling: Secondary | ICD-10-CM | POA: Diagnosis not present

## 2022-12-15 DIAGNOSIS — R32 Unspecified urinary incontinence: Secondary | ICD-10-CM | POA: Diagnosis not present

## 2022-12-15 DIAGNOSIS — R1310 Dysphagia, unspecified: Secondary | ICD-10-CM | POA: Diagnosis not present

## 2022-12-15 DIAGNOSIS — E119 Type 2 diabetes mellitus without complications: Secondary | ICD-10-CM | POA: Diagnosis not present

## 2022-12-15 DIAGNOSIS — D509 Iron deficiency anemia, unspecified: Secondary | ICD-10-CM | POA: Diagnosis not present

## 2022-12-15 DIAGNOSIS — E1151 Type 2 diabetes mellitus with diabetic peripheral angiopathy without gangrene: Secondary | ICD-10-CM | POA: Diagnosis not present

## 2022-12-15 DIAGNOSIS — R69 Illness, unspecified: Secondary | ICD-10-CM | POA: Diagnosis not present

## 2022-12-15 DIAGNOSIS — J449 Chronic obstructive pulmonary disease, unspecified: Secondary | ICD-10-CM | POA: Diagnosis not present

## 2022-12-15 DIAGNOSIS — N3281 Overactive bladder: Secondary | ICD-10-CM | POA: Diagnosis not present

## 2022-12-15 DIAGNOSIS — Z794 Long term (current) use of insulin: Secondary | ICD-10-CM | POA: Diagnosis not present

## 2022-12-15 DIAGNOSIS — G4700 Insomnia, unspecified: Secondary | ICD-10-CM | POA: Diagnosis not present

## 2022-12-15 DIAGNOSIS — E7800 Pure hypercholesterolemia, unspecified: Secondary | ICD-10-CM | POA: Diagnosis not present

## 2022-12-18 ENCOUNTER — Encounter: Payer: Self-pay | Admitting: Internal Medicine

## 2022-12-18 ENCOUNTER — Ambulatory Visit (INDEPENDENT_AMBULATORY_CARE_PROVIDER_SITE_OTHER): Payer: Medicare HMO | Admitting: Internal Medicine

## 2022-12-18 VITALS — BP 110/72 | HR 60 | Ht 63.0 in | Wt 105.4 lb

## 2022-12-18 DIAGNOSIS — E7800 Pure hypercholesterolemia, unspecified: Secondary | ICD-10-CM | POA: Diagnosis not present

## 2022-12-18 DIAGNOSIS — E1151 Type 2 diabetes mellitus with diabetic peripheral angiopathy without gangrene: Secondary | ICD-10-CM

## 2022-12-18 LAB — POCT GLYCOSYLATED HEMOGLOBIN (HGB A1C): Hemoglobin A1C: 5.6 % (ref 4.0–5.6)

## 2022-12-18 MED ORDER — METFORMIN HCL ER 500 MG PO TB24: 500.0000 mg | ORAL_TABLET | Freq: Two times a day (BID) | ORAL | 3 refills | Status: DC

## 2022-12-18 NOTE — Patient Instructions (Addendum)
Please reduce: - Metformin ER 500 mg 2x a day, with meals  Continue: - Novolog 2-6 units 2-3x a day 15 min before the meal If the sugars are high between meals, you can correct with NovoLog: >200: + 1 unit >300: + 2 units >400 + 3 units  Please return in 3-4 months.

## 2022-12-18 NOTE — Progress Notes (Signed)
Patient ID: Jill Higgins, female   DOB: 02/20/1946, 77 y.o.   MRN: [id number]  HPI: Jill Higgins is a 77 y.o.-year-old female, returning for follow-up for DM2, dx in 2013, non-insulin-dependent (but previously on insulin in 2021), uncontrolled, with complications (CAD, PAD). Pt. previously saw Dr. Loanne Drilling, but last visit with me 4 months ago.  Interim history: She has increased urination, but no blurry vision, chest pain. Since last visit, she lost 10 pounds.  She describes that her weight loss started with her Whipple procedure (ended up having a benign tumor) in 2020.  She lost approximately 50 pounds since then.  Before last visit, she was hospitalized with severe abdominal pain and was found to have pancreatitis 06/2022. She lost more weight around that time.  She had abdominal pain radiating to the back >> now resolved.  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  Reviewed HbA1c: Lab Results  Component Value Date   HGBA1C 7.0 (A) 08/17/2022   HGBA1C 7.0 (A) 04/20/2022   HGBA1C 6.3 (A) 01/23/2022   HGBA1C 6.8 (A) 10/24/2021   HGBA1C 7.5 (A) 07/18/2021   HGBA1C 7.9 (A) 02/01/2021   HGBA1C 8.1 (A) 12/03/2020   HGBA1C 9.5 (A) 11/02/2020   HGBA1C 8.3 (A) 08/10/2020   HGBA1C 8.5 05/13/2020  06/26/2022: HbA1c 8.5%   Pt is on a regimen of: - Metformin ER 1000  mg 2x a day, with meals (forgot to reduce the dose) - NovoLog 4-8 units 2x a day >> - Novolog 2-6 units 2x a day 15 min before the meal If the sugars are high between meals, you can correct with NovoLog: >200: + 1 unit >300: + 2 units >400 + 3 units She was previously on insulin  01/2022. She had previous hypoglycemia with glipizide. She was on repaglinide until we started NovoLog.  Pt checks her sugars >4x a day with the Libre 2  CGM:  Previously:  Previously:   Lowest blood sugars: 54 - pm; she has hypoglycemia awareness at 70.  Highest sugar was 300s (hand surgery).  Glucometer: Accu-Chek guide  Pt's meals - 2x a day.  - no CKD, BUN/creatinine:  Comp Metabolic Panel Reviewed NGEX:52/84/1324 07:39:40 AM Interpretation: Performing Lab: Notes/Report: Testing Performed at: [facility], [address], [address], [place], [place] [address]  Glucose 87 70-99 mg/dL  BUN 10 6-26 mg/dL  Creatinine 0.94 0.60-1.30 mg/dl  eGFR2021 62 >60 calc  Sodium 145 136-145 mmol/L  Potassium 3.9 3.5-5.5 mmol/L  Chloride 108 98-107 mmol/L  CO2 30 22-32 mmol/L  Anion Gap 11.0 6.0-20.0 mmol/L  Calcium 9.1 8.6-10.3 mg/dL  CA-corrected 10.01 8.60-10.30 mg/dL  Protein, Total 5.8 6.0-8.3 g/dL  Albumin 3.1 3.4-4.8 g/dL  TBIL 0.4 0.3-1.0 mg/dL  ALP 98 38-126 U/L  AST 26 0-39 U/L  ALT 13 0-52 U/L   Reviewed date:05/18/2022 09:00:48 AM Interpretation:gfr 78 elevated blood sugar 143 Performing Lab: Notes/Report: Testing Performed at: [facility], [address], [address], [place], [place] [address]  Glucose 143 70-99 mg/dL  BUN 13 6-26 mg/dL  Creatinine 0.78 0.60-1.30 mg/dl  eGFR2021 78 >60 calc  Sodium 140 136-145 mmol/L  Potassium 4.3 3.5-5.5 mmol/L  Chloride 106 98-107 mmol/L  CO2 30 22-32 mmol/L  Anion Gap 8.6 6.0-20.0 mmol/L  Calcium 9.3 8.6-10.3 mg/dL  Albumin 3.8 3.4-4.8 g/dL  CA-corrected 9.50 8.60-10.30 mg/dL   Lab  Results  Component Value Date   BUN <5 (L) 01/31/2022   BUN <5 (L) 01/29/2022   CREATININE 0.70 01/31/2022   CREATININE 0.63 01/29/2022  She is not on ACE inhibitor/ARB.  -+ HL; last set of lipids: Lipid Panel w/reflex Reviewed date:10/26/2022 11:25:47 AM Interpretation: Performing Lab: Notes/Report: Testing Performed at: [facility], [address], [place], [place] [address]  Cholesterol 133 <200 mg/dL    CHOL/HDL 1.6 2.0-4.0 Ratio    HDLD 82 30-85 mg/dL Values below  40 mg/dL indicate increased risk factor  Triglyceride 65 0-199 mg/dL    NHDL 51 0-129 mg/dL Range dependent upon risk factors.  LDL Chol Calc (NIH) 38 0-99 mg/dL    Lab Results  Component Value Date   CHOL 99 (L) 02/08/2022   HDL 62 02/08/2022   LDLCALC 21 02/08/2022   TRIG 77 02/08/2022   CHOLHDL 1.6 02/08/2022  On Mevacor 10 mg daily  - last eye exam was in 03/2022. No DR reportedly. + Glaucoma. Dr. Sherral Hammers.  - + numbness and tingling in her feet.  Last foot exam 08/17/2022.  On Gabapentin.  She has a history of HTN, overactive bladder, depression/anxiety, anemia-on iron 325 mg daily. She had Whipple procedure 10/2019 >> started to lose weight then: from 151.  ROS: + see HPI  Past Medical History:  Diagnosis Date   Bronchitis    Bulging disc    in neck   Depression with anxiety    Diabetes mellitus    Type II   Fibromyalgia    pt. denies   GERD (gastroesophageal reflux disease)    Glaucoma    History of bronchitis    Hyperlipidemia    Hypertension    Overactive bladder    Peripheral vascular disease (HCC)    Pneumonia    Sleep apnea    recent test negative for sleep apnea   Tobacco abuse    Urinary frequency    Past Surgical History:  Procedure Laterality Date   ABDOMINAL AORTAGRAM N/A 08/15/2013   Procedure: ABDOMINAL Maxcine Ham;  Surgeon: Elam Dutch, MD;  Location: [facility] CATH LAB;  Service: Cardiovascular;  Laterality: N/A;   ABDOMINAL AORTAGRAM N/A 06/19/2014   Procedure: ABDOMINAL Maxcine Ham;  Surgeon: Elam Dutch, MD;  Location: [facility] CATH LAB;  Service: Cardiovascular;  Laterality: N/A;   ABDOMINAL AORTOGRAM W/LOWER EXTREMITY N/A 05/10/2018   Procedure: ABDOMINAL AORTOGRAM W/LOWER EXTREMITY;  Surgeon: Elam Dutch, MD;  Location: [place] CV LAB;  Service: Cardiovascular;  Laterality: N/A;  bilateral   Aortogram w/ PTA  05/14/08,  11-04-10   Bilateral aortogram w/ bilateral  SFA PTA  stenting    BREAST SURGERY Right    boil removal    BUNIONECTOMY     L foot in the 1980s   COLONOSCOPY     2014   DILATION AND CURETTAGE OF UTERUS     X4   ENDARTERECTOMY FEMORAL Right 06/03/2018   Procedure: RIGHT FEMORAL ENDARTERECTOMY;  Surgeon: Elam Dutch, MD;  Location: [place];  Service: Vascular;  Laterality: Right;   Epidural shots in neck      ESOPHAGOGASTRODUODENOSCOPY (EGD) WITH PROPOFOL N/A 09/15/2019   Procedure: ESOPHAGOGASTRODUODENOSCOPY (EGD) WITH PROPOFOL;  Surgeon: Arta Silence, MD;  Location: [facility] ENDOSCOPY;  Service: Endoscopy;  Laterality: N/A;   EUS N/A 09/15/2019   Procedure: UPPER ENDOSCOPIC ULTRASOUND (EUS) LINEAR;  Surgeon: Arta Silence, MD;  Location: [facility] ENDOSCOPY;  Service: Endoscopy;  Laterality: N/A;   EYE SURGERY Bilateral 2016   cataract  removal   FEMORAL-POPLITEAL BYPASS GRAFT Left 10/21/2013   Procedure: LEFT FEMORAL-POPLITEAL ARTERY BYPASS WITH SAPHENOUS VEIN GRAFT , POPLITEAL ENDARTERECTOMY ,INTRAOPERATIVE ARTERIOGRAM, vein patch angioplasty to popliteal artery;  Surgeon: Elam Dutch, MD;  [facility] OR;  Service: Vascular;  Laterality: Left;   FEMORAL-POPLITEAL BYPASS GRAFT Right 02/08/2016   Procedure: Right FEMORAL- to Above Knee POPLITEAL ARTERY Bypass Graft with reversed saphenous vein and Common Femoral Endarterectomy  with profundoplasty;  Surgeon: Elam Dutch, MD;  Location: [place];  Service: Vascular;  Laterality: Right;   FINE NEEDLE ASPIRATION N/A 09/15/2019   Procedure: FINE NEEDLE ASPIRATION (FNA) LINEAR;  Surgeon: Arta Silence, MD;  Location: [facility] ENDOSCOPY;  Service: Endoscopy;  Laterality: N/A;   GROIN DEBRIDEMENT Left 10/28/2013   Procedure: left inner thigh DEBRIDEMENT;  Surgeon: Elam Dutch, MD;  Location: [facility] OR;  Service: Vascular;  Laterality: Left;   HAND SURGERY Left 2010   LOWER EXTREMITY ANGIOGRAM Bilateral 12/24/2015   Procedure: Lower Extremity Angiogram;  Surgeon: Elam Dutch, MD;  Location: [place] CV LAB;  Service: Cardiovascular;  Laterality:  Bilateral;   PATCH ANGIOPLASTY Right 06/03/2018   Procedure: PATCH ANGIOPLASTY USING A XENOSURE 1CM X 14CM BIOLOGIC PATCH;  Surgeon: Elam Dutch, MD;  Location: [facility] OR;  Service: Vascular;  Laterality: Right;   PERIPHERAL VASCULAR CATHETERIZATION N/A 12/24/2015   Procedure: Abdominal Aortogram;  Surgeon: Elam Dutch, MD;  Location: [place] CV LAB;  Service: Cardiovascular;  Laterality: N/A;   PERIPHERAL VASCULAR CATHETERIZATION Left 12/24/2015   Procedure: Peripheral Vascular Balloon Angioplasty;  Surgeon: Elam Dutch, MD;  Location: [place] CV LAB;  Service: Cardiovascular;  Laterality: Left;  drug coated balloon   PERIPHERAL VASCULAR CATHETERIZATION N/A 06/30/2016   Procedure: Abdominal Aortogram;  Surgeon: Elam Dutch, MD;  Location: [place] CV LAB;  Service: Cardiovascular;  Laterality: N/A;   PERIPHERAL VASCULAR CATHETERIZATION Bilateral 06/30/2016   Procedure: Lower Extremity Angiography;  Surgeon: Elam Dutch, MD;  Location: [place] CV LAB;  Service: Cardiovascular;  Laterality: Bilateral;   TONSILLECTOMY     Social History   Socioeconomic History   Marital status: Single    Spouse name: Not on file   Number of children: 1   Years of education: Not on file   Highest education level: Not on file  Occupational History   Occupation: Retired    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  Tobacco Use   Smoking status: Former    Packs/day: 0.10    Types: Cigarettes    Quit date: 10/2014    Years since quitting: 8.1    Passive exposure: Never   Smokeless tobacco: Never   Tobacco comments:    pt states that she is using the E cig only   Vaping Use   Vaping Use: Some days  Substance and Sexual Activity   Alcohol use: Yes    Alcohol/week: 0.0 - 1.0 standard drinks of alcohol    Comment: Occasional; 10-22 rarely    Drug use: No   Sexual activity: Not on file  Other Topics Concern   Not on file  Social History Narrative   Drinks about 2 cups of coffee a day, and some  tea    Social Determinants of Health   Financial Resource Strain: Not on file  Food Insecurity: Not on file  Transportation Needs: Not on file  Physical Activity: Not on file  Stress: Not on file  Social Connections: Not on file  Intimate Partner Violence: Not on file  Current Outpatient Medications on File Prior to Visit  Medication Sig Dispense Refill   amLODipine (NORVASC) 10 MG tablet Take 10 mg by mouth daily.     aspirin EC 81 MG tablet Take 81 mg by mouth at bedtime.     Continuous Blood Gluc Sensor (FREESTYLE LIBRE 2 SENSOR) MISC 1 Device by Does not apply route every 14 (fourteen) days. 6 each 3   diclofenac Sodium (VOLTAREN) 1 % GEL Apply 1 application. topically daily as needed (leg pain).     DULoxetine (CYMBALTA) 60 MG capsule Take 60 mg by mouth daily.     ferrous sulfate 325 (65 FE) MG EC tablet Take 325 mg by mouth daily.     gabapentin (NEURONTIN) 600 MG tablet Take 600 mg by mouth 3 (three) times daily.     insulin aspart (NOVOLOG FLEXPEN) 100 UNIT/ML FlexPen Inject 4-8 Units into the skin 3 (three) times daily with meals. 15 mL 11   Insulin Pen Needle 32G X 4 MM MISC Use 2x a day 200 each 3   latanoprost (XALATAN) 0.005 % ophthalmic solution 1 drop at bedtime.     lipase/protease/amylase 24000-76000 units CPEP Take 1 capsule (24,000 Units total) by mouth 3 (three) times daily before meals. (Patient taking differently: Take 24,000 Units by mouth 3 (three) times daily before meals. Take 1 tablet before meals and 1 tablet after meals and 1 tablet with a snack) 270 capsule 0   lovastatin (MEVACOR) 20 MG tablet Take 20 mg by mouth at bedtime.     metFORMIN (GLUCOPHAGE-XR) 500 MG 24 hr tablet Take 1 tablet (500 mg total) by mouth 2 (two) times daily with a meal. 180 tablet 3   metoprolol succinate (TOPROL-XL) 50 MG 24 hr tablet Take 50 mg by mouth daily. Take with or immediately following a meal.     Multiple Vitamins-Minerals (CENTRUM SILVER 50+WOMEN) TABS Take 1 tablet by  mouth daily.     Netarsudil Dimesylate (RHOPRESSA) 0.02 % SOLN Rhopressa 0.02 % eye drops     Netarsudil-Latanoprost (ROCKLATAN) 0.02-0.005 % SOLN Place 1 drop into both eyes daily.     pantoprazole (PROTONIX) 40 MG tablet Take 40 mg by mouth daily.     polyethylene glycol (MIRALAX / GLYCOLAX) 17 g packet Take 17 g by mouth daily as needed. 14 each 0   temazepam (RESTORIL) 30 MG capsule Take 30 mg by mouth at bedtime as needed for sleep.  0   timolol (TIMOPTIC) 0.5 % ophthalmic solution Place 1 drop into both eyes every morning.     No current facility-administered medications on file prior to visit.   Allergies  Allergen Reactions   Wound Dressing Adhesive Other (See Comments) and Rash    Pt states that tape and electrodes leave red "scars" on her skin and her skin is very sensitive.  Paper tape ok to use    Family History  Problem Relation Age of Onset   Heart disease Father    Heart attack Father        MI at age 91   Hypertension Mother    Alzheimer's disease Mother    Hypertension Sister    Diabetes Brother    Hyperlipidemia Brother    Hypertension Brother    Heart disease Brother    Heart attack Brother    Breast cancer Maternal Aunt    PE: BP 110/72 (BP Location: Right Arm, Patient Position: Sitting, Cuff Size: Normal)   Pulse 60   Ht '5\' 3"'$  (1.6 m)  Wt 105 lb 6.4 oz (47.8 kg)   SpO2 95%   BMI 18.67 kg/m  Wt Readings from Last 3 Encounters:  12/18/22 105 lb 6.4 oz (47.8 kg)  08/17/22 115 lb (52.2 kg)  06/19/22 116 lb (52.6 kg)   Constitutional: thin, in NAD Eyes: EOMI, no exophthalmos ENT: no thyromegaly, no cervical lymphadenopathy Cardiovascular: RRR, No MRG Respiratory: CTA B Musculoskeletal: no deformities Skin:  no rashes Neurological: no tremor with outstretched hands  ASSESSMENT: 1. DM2, non-insulin-dependent, controlled, with complications - CAD - PAD - s/p stents  Component     Latest Ref Rng 04/20/2022  Glucose     65 - 99 mg/dL 140 (H)    C-Peptide     0.80 - 3.85 ng/mL 1.50     2.  Hyperlipidemia  PLAN:  1. Patient with standing, uncontrolled, type 2 diabetes, on oral antidiabetic regimen with metformin and also mealtime insulin.  Her diabetes control worsened after stopping insulin in the past.  When I first saw her approximately 8 months ago, sugars were fluctuating between 58 and 300, and we discussed that this is a sign of insulin deficiency.  We restarted NovoLog and we adjusted the doses at last visit.  We continued metformin. -Of note, she has a history of pancreatitis so we cannot use GLP-1 receptor agonist for her. -At last visit, reviewing her CGM trends,sugars were fluctuating within the target range mostly with only occasional hyperglycemic spikes.  We discussed about taking the NovoLog 10 to 15 minutes before a meal, discussed about how to correct the high blood sugar.  I also recommended to decrease the metformin dose mostly for concerns of decreased appetite and weight loss. CGM interpretation: -At today's visit, we reviewed her CGM downloads: It appears that 84% of values are in target range (goal >70%), while 9% are higher than 180 (goal <25%), and 7% are lower than 70 (goal <4%).  The calculated average blood sugar is 116.  The projected HbA1c for the next 3 months (GMI) is 6.1%. -Reviewing the CGM trends, sugars appear to be fluctuating within the target range with occasional hyperglycemic spikes up to 200, but also mild lows, down to the 60s and rarely lower.  Upon questioning, she forgot to reduce the metformin dose as recommended at last visit and continues to take 2 tablets twice a day.  Will reduce the dose to 500 mg twice a day.  Blood sugars are increasing after meals, still within the target range, so for now, I did not recommend to decrease the dose of NovoLog, but she is taking fewer units than recommended at last visit, which we will continue.  We also discussed that she needs to eat more.  She currently  eats 2 meals a day and I strongly recommended 3 meals a day. - I suggested to:  Patient Instructions  Please reduce: - Metformin ER 500 mg 2x a day, with meals  Continue: - Novolog 2-6 units 2-3x a day 15 min before the meal If the sugars are high between meals, you can correct with NovoLog: >200: + 1 unit >300: + 2 units >400 + 3 units  Please return in 3-4 months.  - we checked her HbA1c: 5.6% (lower) - advised to check sugars at different times of the day - 4x a day, rotating check times - advised for yearly eye exams >> she is UTD - return to clinic in 3-4 months  2. HL -Reviewed the latest lipid panel from 10/2022: All fractions at  goal - see HPI -she continues on lovastatin 20 mg daily without side effects  Philemon Kingdom, MD PhD [facility] Endocrinology

## 2022-12-20 DIAGNOSIS — K219 Gastro-esophageal reflux disease without esophagitis: Secondary | ICD-10-CM | POA: Diagnosis not present

## 2022-12-20 DIAGNOSIS — G4761 Periodic limb movement disorder: Secondary | ICD-10-CM | POA: Diagnosis not present

## 2022-12-20 DIAGNOSIS — G4700 Insomnia, unspecified: Secondary | ICD-10-CM | POA: Diagnosis not present

## 2022-12-20 DIAGNOSIS — E1151 Type 2 diabetes mellitus with diabetic peripheral angiopathy without gangrene: Secondary | ICD-10-CM | POA: Diagnosis not present

## 2022-12-20 DIAGNOSIS — R159 Full incontinence of feces: Secondary | ICD-10-CM | POA: Diagnosis not present

## 2022-12-20 DIAGNOSIS — E7800 Pure hypercholesterolemia, unspecified: Secondary | ICD-10-CM | POA: Diagnosis not present

## 2022-12-20 DIAGNOSIS — Z87891 Personal history of nicotine dependence: Secondary | ICD-10-CM | POA: Diagnosis not present

## 2022-12-20 DIAGNOSIS — R1310 Dysphagia, unspecified: Secondary | ICD-10-CM | POA: Diagnosis not present

## 2022-12-20 DIAGNOSIS — G4733 Obstructive sleep apnea (adult) (pediatric): Secondary | ICD-10-CM | POA: Diagnosis not present

## 2022-12-20 DIAGNOSIS — J449 Chronic obstructive pulmonary disease, unspecified: Secondary | ICD-10-CM | POA: Diagnosis not present

## 2022-12-20 DIAGNOSIS — R69 Illness, unspecified: Secondary | ICD-10-CM | POA: Diagnosis not present

## 2022-12-20 DIAGNOSIS — Z794 Long term (current) use of insulin: Secondary | ICD-10-CM | POA: Diagnosis not present

## 2022-12-20 DIAGNOSIS — I10 Essential (primary) hypertension: Secondary | ICD-10-CM | POA: Diagnosis not present

## 2022-12-20 DIAGNOSIS — K529 Noninfective gastroenteritis and colitis, unspecified: Secondary | ICD-10-CM | POA: Diagnosis not present

## 2022-12-20 DIAGNOSIS — Z9181 History of falling: Secondary | ICD-10-CM | POA: Diagnosis not present

## 2022-12-20 DIAGNOSIS — H409 Unspecified glaucoma: Secondary | ICD-10-CM | POA: Diagnosis not present

## 2022-12-20 DIAGNOSIS — Z7984 Long term (current) use of oral hypoglycemic drugs: Secondary | ICD-10-CM | POA: Diagnosis not present

## 2022-12-20 DIAGNOSIS — N3281 Overactive bladder: Secondary | ICD-10-CM | POA: Diagnosis not present

## 2022-12-20 DIAGNOSIS — D509 Iron deficiency anemia, unspecified: Secondary | ICD-10-CM | POA: Diagnosis not present

## 2022-12-20 DIAGNOSIS — R32 Unspecified urinary incontinence: Secondary | ICD-10-CM | POA: Diagnosis not present

## 2022-12-21 DIAGNOSIS — G4700 Insomnia, unspecified: Secondary | ICD-10-CM | POA: Diagnosis not present

## 2022-12-21 DIAGNOSIS — G4761 Periodic limb movement disorder: Secondary | ICD-10-CM | POA: Diagnosis not present

## 2022-12-21 DIAGNOSIS — R159 Full incontinence of feces: Secondary | ICD-10-CM | POA: Diagnosis not present

## 2022-12-21 DIAGNOSIS — R32 Unspecified urinary incontinence: Secondary | ICD-10-CM | POA: Diagnosis not present

## 2022-12-21 DIAGNOSIS — I10 Essential (primary) hypertension: Secondary | ICD-10-CM | POA: Diagnosis not present

## 2022-12-21 DIAGNOSIS — Z87891 Personal history of nicotine dependence: Secondary | ICD-10-CM | POA: Diagnosis not present

## 2022-12-21 DIAGNOSIS — Z9181 History of falling: Secondary | ICD-10-CM | POA: Diagnosis not present

## 2022-12-21 DIAGNOSIS — K529 Noninfective gastroenteritis and colitis, unspecified: Secondary | ICD-10-CM | POA: Diagnosis not present

## 2022-12-21 DIAGNOSIS — R1310 Dysphagia, unspecified: Secondary | ICD-10-CM | POA: Diagnosis not present

## 2022-12-21 DIAGNOSIS — G4733 Obstructive sleep apnea (adult) (pediatric): Secondary | ICD-10-CM | POA: Diagnosis not present

## 2022-12-21 DIAGNOSIS — R69 Illness, unspecified: Secondary | ICD-10-CM | POA: Diagnosis not present

## 2022-12-21 DIAGNOSIS — J449 Chronic obstructive pulmonary disease, unspecified: Secondary | ICD-10-CM | POA: Diagnosis not present

## 2022-12-21 DIAGNOSIS — E1151 Type 2 diabetes mellitus with diabetic peripheral angiopathy without gangrene: Secondary | ICD-10-CM | POA: Diagnosis not present

## 2022-12-21 DIAGNOSIS — K219 Gastro-esophageal reflux disease without esophagitis: Secondary | ICD-10-CM | POA: Diagnosis not present

## 2022-12-21 DIAGNOSIS — Z794 Long term (current) use of insulin: Secondary | ICD-10-CM | POA: Diagnosis not present

## 2022-12-21 DIAGNOSIS — H409 Unspecified glaucoma: Secondary | ICD-10-CM | POA: Diagnosis not present

## 2022-12-21 DIAGNOSIS — Z7984 Long term (current) use of oral hypoglycemic drugs: Secondary | ICD-10-CM | POA: Diagnosis not present

## 2022-12-21 DIAGNOSIS — E7800 Pure hypercholesterolemia, unspecified: Secondary | ICD-10-CM | POA: Diagnosis not present

## 2022-12-21 DIAGNOSIS — N3281 Overactive bladder: Secondary | ICD-10-CM | POA: Diagnosis not present

## 2022-12-21 DIAGNOSIS — D509 Iron deficiency anemia, unspecified: Secondary | ICD-10-CM | POA: Diagnosis not present

## 2022-12-26 DIAGNOSIS — G253 Myoclonus: Secondary | ICD-10-CM | POA: Diagnosis not present

## 2022-12-26 DIAGNOSIS — G4700 Insomnia, unspecified: Secondary | ICD-10-CM | POA: Diagnosis not present

## 2022-12-26 DIAGNOSIS — E119 Type 2 diabetes mellitus without complications: Secondary | ICD-10-CM | POA: Diagnosis not present

## 2022-12-26 DIAGNOSIS — E7800 Pure hypercholesterolemia, unspecified: Secondary | ICD-10-CM | POA: Diagnosis not present

## 2022-12-26 DIAGNOSIS — I10 Essential (primary) hypertension: Secondary | ICD-10-CM | POA: Diagnosis not present

## 2022-12-27 DIAGNOSIS — R1310 Dysphagia, unspecified: Secondary | ICD-10-CM | POA: Diagnosis not present

## 2022-12-27 DIAGNOSIS — G4761 Periodic limb movement disorder: Secondary | ICD-10-CM | POA: Diagnosis not present

## 2022-12-27 DIAGNOSIS — N3281 Overactive bladder: Secondary | ICD-10-CM | POA: Diagnosis not present

## 2022-12-27 DIAGNOSIS — K529 Noninfective gastroenteritis and colitis, unspecified: Secondary | ICD-10-CM | POA: Diagnosis not present

## 2022-12-27 DIAGNOSIS — R69 Illness, unspecified: Secondary | ICD-10-CM | POA: Diagnosis not present

## 2022-12-27 DIAGNOSIS — R159 Full incontinence of feces: Secondary | ICD-10-CM | POA: Diagnosis not present

## 2022-12-27 DIAGNOSIS — R32 Unspecified urinary incontinence: Secondary | ICD-10-CM | POA: Diagnosis not present

## 2022-12-27 DIAGNOSIS — E7800 Pure hypercholesterolemia, unspecified: Secondary | ICD-10-CM | POA: Diagnosis not present

## 2022-12-27 DIAGNOSIS — K219 Gastro-esophageal reflux disease without esophagitis: Secondary | ICD-10-CM | POA: Diagnosis not present

## 2022-12-27 DIAGNOSIS — I10 Essential (primary) hypertension: Secondary | ICD-10-CM | POA: Diagnosis not present

## 2022-12-27 DIAGNOSIS — Z7984 Long term (current) use of oral hypoglycemic drugs: Secondary | ICD-10-CM | POA: Diagnosis not present

## 2022-12-27 DIAGNOSIS — H409 Unspecified glaucoma: Secondary | ICD-10-CM | POA: Diagnosis not present

## 2022-12-27 DIAGNOSIS — J449 Chronic obstructive pulmonary disease, unspecified: Secondary | ICD-10-CM | POA: Diagnosis not present

## 2022-12-27 DIAGNOSIS — Z794 Long term (current) use of insulin: Secondary | ICD-10-CM | POA: Diagnosis not present

## 2022-12-27 DIAGNOSIS — E1151 Type 2 diabetes mellitus with diabetic peripheral angiopathy without gangrene: Secondary | ICD-10-CM | POA: Diagnosis not present

## 2022-12-27 DIAGNOSIS — D509 Iron deficiency anemia, unspecified: Secondary | ICD-10-CM | POA: Diagnosis not present

## 2022-12-27 DIAGNOSIS — G4700 Insomnia, unspecified: Secondary | ICD-10-CM | POA: Diagnosis not present

## 2022-12-27 DIAGNOSIS — G4733 Obstructive sleep apnea (adult) (pediatric): Secondary | ICD-10-CM | POA: Diagnosis not present

## 2022-12-27 DIAGNOSIS — Z87891 Personal history of nicotine dependence: Secondary | ICD-10-CM | POA: Diagnosis not present

## 2022-12-27 DIAGNOSIS — Z9181 History of falling: Secondary | ICD-10-CM | POA: Diagnosis not present

## 2023-01-03 DIAGNOSIS — K219 Gastro-esophageal reflux disease without esophagitis: Secondary | ICD-10-CM | POA: Diagnosis not present

## 2023-01-03 DIAGNOSIS — E1151 Type 2 diabetes mellitus with diabetic peripheral angiopathy without gangrene: Secondary | ICD-10-CM | POA: Diagnosis not present

## 2023-01-03 DIAGNOSIS — Z9181 History of falling: Secondary | ICD-10-CM | POA: Diagnosis not present

## 2023-01-03 DIAGNOSIS — K529 Noninfective gastroenteritis and colitis, unspecified: Secondary | ICD-10-CM | POA: Diagnosis not present

## 2023-01-03 DIAGNOSIS — G4700 Insomnia, unspecified: Secondary | ICD-10-CM | POA: Diagnosis not present

## 2023-01-03 DIAGNOSIS — Z87891 Personal history of nicotine dependence: Secondary | ICD-10-CM | POA: Diagnosis not present

## 2023-01-03 DIAGNOSIS — D509 Iron deficiency anemia, unspecified: Secondary | ICD-10-CM | POA: Diagnosis not present

## 2023-01-03 DIAGNOSIS — G4733 Obstructive sleep apnea (adult) (pediatric): Secondary | ICD-10-CM | POA: Diagnosis not present

## 2023-01-03 DIAGNOSIS — H409 Unspecified glaucoma: Secondary | ICD-10-CM | POA: Diagnosis not present

## 2023-01-03 DIAGNOSIS — N3281 Overactive bladder: Secondary | ICD-10-CM | POA: Diagnosis not present

## 2023-01-03 DIAGNOSIS — R69 Illness, unspecified: Secondary | ICD-10-CM | POA: Diagnosis not present

## 2023-01-03 DIAGNOSIS — Z794 Long term (current) use of insulin: Secondary | ICD-10-CM | POA: Diagnosis not present

## 2023-01-03 DIAGNOSIS — G4761 Periodic limb movement disorder: Secondary | ICD-10-CM | POA: Diagnosis not present

## 2023-01-03 DIAGNOSIS — R1310 Dysphagia, unspecified: Secondary | ICD-10-CM | POA: Diagnosis not present

## 2023-01-03 DIAGNOSIS — Z7984 Long term (current) use of oral hypoglycemic drugs: Secondary | ICD-10-CM | POA: Diagnosis not present

## 2023-01-03 DIAGNOSIS — J449 Chronic obstructive pulmonary disease, unspecified: Secondary | ICD-10-CM | POA: Diagnosis not present

## 2023-01-03 DIAGNOSIS — E7800 Pure hypercholesterolemia, unspecified: Secondary | ICD-10-CM | POA: Diagnosis not present

## 2023-01-03 DIAGNOSIS — I10 Essential (primary) hypertension: Secondary | ICD-10-CM | POA: Diagnosis not present

## 2023-01-03 DIAGNOSIS — R159 Full incontinence of feces: Secondary | ICD-10-CM | POA: Diagnosis not present

## 2023-01-03 DIAGNOSIS — R32 Unspecified urinary incontinence: Secondary | ICD-10-CM | POA: Diagnosis not present

## 2023-01-05 DIAGNOSIS — K529 Noninfective gastroenteritis and colitis, unspecified: Secondary | ICD-10-CM | POA: Diagnosis not present

## 2023-01-05 DIAGNOSIS — Z9181 History of falling: Secondary | ICD-10-CM | POA: Diagnosis not present

## 2023-01-05 DIAGNOSIS — E7800 Pure hypercholesterolemia, unspecified: Secondary | ICD-10-CM | POA: Diagnosis not present

## 2023-01-05 DIAGNOSIS — G4761 Periodic limb movement disorder: Secondary | ICD-10-CM | POA: Diagnosis not present

## 2023-01-05 DIAGNOSIS — R69 Illness, unspecified: Secondary | ICD-10-CM | POA: Diagnosis not present

## 2023-01-05 DIAGNOSIS — G4700 Insomnia, unspecified: Secondary | ICD-10-CM | POA: Diagnosis not present

## 2023-01-05 DIAGNOSIS — Z87891 Personal history of nicotine dependence: Secondary | ICD-10-CM | POA: Diagnosis not present

## 2023-01-05 DIAGNOSIS — E1151 Type 2 diabetes mellitus with diabetic peripheral angiopathy without gangrene: Secondary | ICD-10-CM | POA: Diagnosis not present

## 2023-01-05 DIAGNOSIS — J449 Chronic obstructive pulmonary disease, unspecified: Secondary | ICD-10-CM | POA: Diagnosis not present

## 2023-01-05 DIAGNOSIS — I10 Essential (primary) hypertension: Secondary | ICD-10-CM | POA: Diagnosis not present

## 2023-01-05 DIAGNOSIS — Z7984 Long term (current) use of oral hypoglycemic drugs: Secondary | ICD-10-CM | POA: Diagnosis not present

## 2023-01-05 DIAGNOSIS — Z794 Long term (current) use of insulin: Secondary | ICD-10-CM | POA: Diagnosis not present

## 2023-01-05 DIAGNOSIS — K219 Gastro-esophageal reflux disease without esophagitis: Secondary | ICD-10-CM | POA: Diagnosis not present

## 2023-01-05 DIAGNOSIS — N3281 Overactive bladder: Secondary | ICD-10-CM | POA: Diagnosis not present

## 2023-01-05 DIAGNOSIS — R32 Unspecified urinary incontinence: Secondary | ICD-10-CM | POA: Diagnosis not present

## 2023-01-05 DIAGNOSIS — H409 Unspecified glaucoma: Secondary | ICD-10-CM | POA: Diagnosis not present

## 2023-01-05 DIAGNOSIS — R159 Full incontinence of feces: Secondary | ICD-10-CM | POA: Diagnosis not present

## 2023-01-05 DIAGNOSIS — G4733 Obstructive sleep apnea (adult) (pediatric): Secondary | ICD-10-CM | POA: Diagnosis not present

## 2023-01-05 DIAGNOSIS — D509 Iron deficiency anemia, unspecified: Secondary | ICD-10-CM | POA: Diagnosis not present

## 2023-01-05 DIAGNOSIS — R1310 Dysphagia, unspecified: Secondary | ICD-10-CM | POA: Diagnosis not present

## 2023-01-09 DIAGNOSIS — K529 Noninfective gastroenteritis and colitis, unspecified: Secondary | ICD-10-CM | POA: Diagnosis not present

## 2023-01-09 DIAGNOSIS — N3281 Overactive bladder: Secondary | ICD-10-CM | POA: Diagnosis not present

## 2023-01-09 DIAGNOSIS — G4761 Periodic limb movement disorder: Secondary | ICD-10-CM | POA: Diagnosis not present

## 2023-01-09 DIAGNOSIS — G4700 Insomnia, unspecified: Secondary | ICD-10-CM | POA: Diagnosis not present

## 2023-01-09 DIAGNOSIS — G4733 Obstructive sleep apnea (adult) (pediatric): Secondary | ICD-10-CM | POA: Diagnosis not present

## 2023-01-09 DIAGNOSIS — Z7984 Long term (current) use of oral hypoglycemic drugs: Secondary | ICD-10-CM | POA: Diagnosis not present

## 2023-01-09 DIAGNOSIS — Z9181 History of falling: Secondary | ICD-10-CM | POA: Diagnosis not present

## 2023-01-09 DIAGNOSIS — H409 Unspecified glaucoma: Secondary | ICD-10-CM | POA: Diagnosis not present

## 2023-01-09 DIAGNOSIS — R32 Unspecified urinary incontinence: Secondary | ICD-10-CM | POA: Diagnosis not present

## 2023-01-09 DIAGNOSIS — J449 Chronic obstructive pulmonary disease, unspecified: Secondary | ICD-10-CM | POA: Diagnosis not present

## 2023-01-09 DIAGNOSIS — E1151 Type 2 diabetes mellitus with diabetic peripheral angiopathy without gangrene: Secondary | ICD-10-CM | POA: Diagnosis not present

## 2023-01-09 DIAGNOSIS — Z87891 Personal history of nicotine dependence: Secondary | ICD-10-CM | POA: Diagnosis not present

## 2023-01-09 DIAGNOSIS — Z794 Long term (current) use of insulin: Secondary | ICD-10-CM | POA: Diagnosis not present

## 2023-01-09 DIAGNOSIS — I10 Essential (primary) hypertension: Secondary | ICD-10-CM | POA: Diagnosis not present

## 2023-01-09 DIAGNOSIS — E7800 Pure hypercholesterolemia, unspecified: Secondary | ICD-10-CM | POA: Diagnosis not present

## 2023-01-09 DIAGNOSIS — R159 Full incontinence of feces: Secondary | ICD-10-CM | POA: Diagnosis not present

## 2023-01-09 DIAGNOSIS — D509 Iron deficiency anemia, unspecified: Secondary | ICD-10-CM | POA: Diagnosis not present

## 2023-01-09 DIAGNOSIS — R1310 Dysphagia, unspecified: Secondary | ICD-10-CM | POA: Diagnosis not present

## 2023-01-09 DIAGNOSIS — K219 Gastro-esophageal reflux disease without esophagitis: Secondary | ICD-10-CM | POA: Diagnosis not present

## 2023-01-09 DIAGNOSIS — R69 Illness, unspecified: Secondary | ICD-10-CM | POA: Diagnosis not present

## 2023-01-11 DIAGNOSIS — E1151 Type 2 diabetes mellitus with diabetic peripheral angiopathy without gangrene: Secondary | ICD-10-CM | POA: Diagnosis not present

## 2023-01-11 DIAGNOSIS — H409 Unspecified glaucoma: Secondary | ICD-10-CM | POA: Diagnosis not present

## 2023-01-11 DIAGNOSIS — G4733 Obstructive sleep apnea (adult) (pediatric): Secondary | ICD-10-CM | POA: Diagnosis not present

## 2023-01-11 DIAGNOSIS — R32 Unspecified urinary incontinence: Secondary | ICD-10-CM | POA: Diagnosis not present

## 2023-01-11 DIAGNOSIS — K529 Noninfective gastroenteritis and colitis, unspecified: Secondary | ICD-10-CM | POA: Diagnosis not present

## 2023-01-11 DIAGNOSIS — Z7984 Long term (current) use of oral hypoglycemic drugs: Secondary | ICD-10-CM | POA: Diagnosis not present

## 2023-01-11 DIAGNOSIS — D509 Iron deficiency anemia, unspecified: Secondary | ICD-10-CM | POA: Diagnosis not present

## 2023-01-11 DIAGNOSIS — R1310 Dysphagia, unspecified: Secondary | ICD-10-CM | POA: Diagnosis not present

## 2023-01-11 DIAGNOSIS — R69 Illness, unspecified: Secondary | ICD-10-CM | POA: Diagnosis not present

## 2023-01-11 DIAGNOSIS — Z87891 Personal history of nicotine dependence: Secondary | ICD-10-CM | POA: Diagnosis not present

## 2023-01-11 DIAGNOSIS — Z794 Long term (current) use of insulin: Secondary | ICD-10-CM | POA: Diagnosis not present

## 2023-01-11 DIAGNOSIS — R159 Full incontinence of feces: Secondary | ICD-10-CM | POA: Diagnosis not present

## 2023-01-11 DIAGNOSIS — K219 Gastro-esophageal reflux disease without esophagitis: Secondary | ICD-10-CM | POA: Diagnosis not present

## 2023-01-11 DIAGNOSIS — E7800 Pure hypercholesterolemia, unspecified: Secondary | ICD-10-CM | POA: Diagnosis not present

## 2023-01-11 DIAGNOSIS — G4700 Insomnia, unspecified: Secondary | ICD-10-CM | POA: Diagnosis not present

## 2023-01-11 DIAGNOSIS — Z9181 History of falling: Secondary | ICD-10-CM | POA: Diagnosis not present

## 2023-01-11 DIAGNOSIS — G4761 Periodic limb movement disorder: Secondary | ICD-10-CM | POA: Diagnosis not present

## 2023-01-11 DIAGNOSIS — J449 Chronic obstructive pulmonary disease, unspecified: Secondary | ICD-10-CM | POA: Diagnosis not present

## 2023-01-11 DIAGNOSIS — N3281 Overactive bladder: Secondary | ICD-10-CM | POA: Diagnosis not present

## 2023-01-11 DIAGNOSIS — I10 Essential (primary) hypertension: Secondary | ICD-10-CM | POA: Diagnosis not present

## 2023-01-18 DIAGNOSIS — R1310 Dysphagia, unspecified: Secondary | ICD-10-CM | POA: Diagnosis not present

## 2023-01-18 DIAGNOSIS — E1151 Type 2 diabetes mellitus with diabetic peripheral angiopathy without gangrene: Secondary | ICD-10-CM | POA: Diagnosis not present

## 2023-01-18 DIAGNOSIS — Z794 Long term (current) use of insulin: Secondary | ICD-10-CM | POA: Diagnosis not present

## 2023-01-18 DIAGNOSIS — D509 Iron deficiency anemia, unspecified: Secondary | ICD-10-CM | POA: Diagnosis not present

## 2023-01-18 DIAGNOSIS — K219 Gastro-esophageal reflux disease without esophagitis: Secondary | ICD-10-CM | POA: Diagnosis not present

## 2023-01-18 DIAGNOSIS — H409 Unspecified glaucoma: Secondary | ICD-10-CM | POA: Diagnosis not present

## 2023-01-18 DIAGNOSIS — G4700 Insomnia, unspecified: Secondary | ICD-10-CM | POA: Diagnosis not present

## 2023-01-18 DIAGNOSIS — Z7984 Long term (current) use of oral hypoglycemic drugs: Secondary | ICD-10-CM | POA: Diagnosis not present

## 2023-01-18 DIAGNOSIS — R69 Illness, unspecified: Secondary | ICD-10-CM | POA: Diagnosis not present

## 2023-01-18 DIAGNOSIS — R159 Full incontinence of feces: Secondary | ICD-10-CM | POA: Diagnosis not present

## 2023-01-18 DIAGNOSIS — E7800 Pure hypercholesterolemia, unspecified: Secondary | ICD-10-CM | POA: Diagnosis not present

## 2023-01-18 DIAGNOSIS — I10 Essential (primary) hypertension: Secondary | ICD-10-CM | POA: Diagnosis not present

## 2023-01-18 DIAGNOSIS — J449 Chronic obstructive pulmonary disease, unspecified: Secondary | ICD-10-CM | POA: Diagnosis not present

## 2023-01-18 DIAGNOSIS — Z9181 History of falling: Secondary | ICD-10-CM | POA: Diagnosis not present

## 2023-01-18 DIAGNOSIS — G4733 Obstructive sleep apnea (adult) (pediatric): Secondary | ICD-10-CM | POA: Diagnosis not present

## 2023-01-18 DIAGNOSIS — N3281 Overactive bladder: Secondary | ICD-10-CM | POA: Diagnosis not present

## 2023-01-18 DIAGNOSIS — Z87891 Personal history of nicotine dependence: Secondary | ICD-10-CM | POA: Diagnosis not present

## 2023-01-18 DIAGNOSIS — K529 Noninfective gastroenteritis and colitis, unspecified: Secondary | ICD-10-CM | POA: Diagnosis not present

## 2023-01-18 DIAGNOSIS — G4761 Periodic limb movement disorder: Secondary | ICD-10-CM | POA: Diagnosis not present

## 2023-01-18 DIAGNOSIS — R32 Unspecified urinary incontinence: Secondary | ICD-10-CM | POA: Diagnosis not present

## 2023-01-24 DIAGNOSIS — R159 Full incontinence of feces: Secondary | ICD-10-CM | POA: Diagnosis not present

## 2023-01-24 DIAGNOSIS — G4761 Periodic limb movement disorder: Secondary | ICD-10-CM | POA: Diagnosis not present

## 2023-01-24 DIAGNOSIS — R32 Unspecified urinary incontinence: Secondary | ICD-10-CM | POA: Diagnosis not present

## 2023-01-24 DIAGNOSIS — Z7984 Long term (current) use of oral hypoglycemic drugs: Secondary | ICD-10-CM | POA: Diagnosis not present

## 2023-01-24 DIAGNOSIS — Z794 Long term (current) use of insulin: Secondary | ICD-10-CM | POA: Diagnosis not present

## 2023-01-24 DIAGNOSIS — R1310 Dysphagia, unspecified: Secondary | ICD-10-CM | POA: Diagnosis not present

## 2023-01-24 DIAGNOSIS — N3281 Overactive bladder: Secondary | ICD-10-CM | POA: Diagnosis not present

## 2023-01-24 DIAGNOSIS — I10 Essential (primary) hypertension: Secondary | ICD-10-CM | POA: Diagnosis not present

## 2023-01-24 DIAGNOSIS — K219 Gastro-esophageal reflux disease without esophagitis: Secondary | ICD-10-CM | POA: Diagnosis not present

## 2023-01-24 DIAGNOSIS — D509 Iron deficiency anemia, unspecified: Secondary | ICD-10-CM | POA: Diagnosis not present

## 2023-01-24 DIAGNOSIS — E7800 Pure hypercholesterolemia, unspecified: Secondary | ICD-10-CM | POA: Diagnosis not present

## 2023-01-24 DIAGNOSIS — G4700 Insomnia, unspecified: Secondary | ICD-10-CM | POA: Diagnosis not present

## 2023-01-24 DIAGNOSIS — Z87891 Personal history of nicotine dependence: Secondary | ICD-10-CM | POA: Diagnosis not present

## 2023-01-24 DIAGNOSIS — G4733 Obstructive sleep apnea (adult) (pediatric): Secondary | ICD-10-CM | POA: Diagnosis not present

## 2023-01-24 DIAGNOSIS — K529 Noninfective gastroenteritis and colitis, unspecified: Secondary | ICD-10-CM | POA: Diagnosis not present

## 2023-01-24 DIAGNOSIS — Z9181 History of falling: Secondary | ICD-10-CM | POA: Diagnosis not present

## 2023-01-24 DIAGNOSIS — R69 Illness, unspecified: Secondary | ICD-10-CM | POA: Diagnosis not present

## 2023-01-24 DIAGNOSIS — H409 Unspecified glaucoma: Secondary | ICD-10-CM | POA: Diagnosis not present

## 2023-01-24 DIAGNOSIS — E1151 Type 2 diabetes mellitus with diabetic peripheral angiopathy without gangrene: Secondary | ICD-10-CM | POA: Diagnosis not present

## 2023-01-24 DIAGNOSIS — J449 Chronic obstructive pulmonary disease, unspecified: Secondary | ICD-10-CM | POA: Diagnosis not present

## 2023-02-08 ENCOUNTER — Other Ambulatory Visit: Payer: Self-pay | Admitting: Family Medicine

## 2023-02-08 DIAGNOSIS — Z9181 History of falling: Secondary | ICD-10-CM | POA: Diagnosis not present

## 2023-02-08 DIAGNOSIS — G4700 Insomnia, unspecified: Secondary | ICD-10-CM | POA: Diagnosis not present

## 2023-02-08 DIAGNOSIS — K219 Gastro-esophageal reflux disease without esophagitis: Secondary | ICD-10-CM | POA: Diagnosis not present

## 2023-02-08 DIAGNOSIS — N3281 Overactive bladder: Secondary | ICD-10-CM | POA: Diagnosis not present

## 2023-02-08 DIAGNOSIS — E1151 Type 2 diabetes mellitus with diabetic peripheral angiopathy without gangrene: Secondary | ICD-10-CM | POA: Diagnosis not present

## 2023-02-08 DIAGNOSIS — I10 Essential (primary) hypertension: Secondary | ICD-10-CM | POA: Diagnosis not present

## 2023-02-08 DIAGNOSIS — R1310 Dysphagia, unspecified: Secondary | ICD-10-CM | POA: Diagnosis not present

## 2023-02-08 DIAGNOSIS — H409 Unspecified glaucoma: Secondary | ICD-10-CM | POA: Diagnosis not present

## 2023-02-08 DIAGNOSIS — Z794 Long term (current) use of insulin: Secondary | ICD-10-CM | POA: Diagnosis not present

## 2023-02-08 DIAGNOSIS — J449 Chronic obstructive pulmonary disease, unspecified: Secondary | ICD-10-CM | POA: Diagnosis not present

## 2023-02-08 DIAGNOSIS — Z139 Encounter for screening, unspecified: Secondary | ICD-10-CM

## 2023-02-08 DIAGNOSIS — K529 Noninfective gastroenteritis and colitis, unspecified: Secondary | ICD-10-CM | POA: Diagnosis not present

## 2023-02-08 DIAGNOSIS — G4761 Periodic limb movement disorder: Secondary | ICD-10-CM | POA: Diagnosis not present

## 2023-02-08 DIAGNOSIS — E7800 Pure hypercholesterolemia, unspecified: Secondary | ICD-10-CM | POA: Diagnosis not present

## 2023-02-08 DIAGNOSIS — Z87891 Personal history of nicotine dependence: Secondary | ICD-10-CM | POA: Diagnosis not present

## 2023-02-08 DIAGNOSIS — Z7984 Long term (current) use of oral hypoglycemic drugs: Secondary | ICD-10-CM | POA: Diagnosis not present

## 2023-02-08 DIAGNOSIS — R159 Full incontinence of feces: Secondary | ICD-10-CM | POA: Diagnosis not present

## 2023-02-08 DIAGNOSIS — R69 Illness, unspecified: Secondary | ICD-10-CM | POA: Diagnosis not present

## 2023-02-08 DIAGNOSIS — D509 Iron deficiency anemia, unspecified: Secondary | ICD-10-CM | POA: Diagnosis not present

## 2023-02-08 DIAGNOSIS — R32 Unspecified urinary incontinence: Secondary | ICD-10-CM | POA: Diagnosis not present

## 2023-02-08 DIAGNOSIS — G4733 Obstructive sleep apnea (adult) (pediatric): Secondary | ICD-10-CM | POA: Diagnosis not present

## 2023-03-02 NOTE — Progress Notes (Deleted)
HPI: FU syncope. Patient is status post right femoral endarterectomy and right fem-pop bypass 2014.  She also has had a left femoral-popliteal bypass in 2017 and angioplasty of the distal anastomosis in 2017. PVD followed by vascular surgery. Seen with syncope previously. Nuclear study January 2022 showed ejection fraction 64%, apical thinning but no ischemia. Monitor 1/22 showed sinus bradycardia/NSR. Echo 12/31/20 showed EF 50-55, mild LVH, restrictive filling.  Admitted with pancreatitis March 2023.  Dopplers 6/23 showed showed patent bilateral femoral to popliteal bypass grafts.  On the right there was note of distal EIA at 50 to 74%.Marland Kitchen  ABIs June 2023 normal bilaterally.  Since last seen,  Current Outpatient Medications  Medication Sig Dispense Refill   amLODipine (NORVASC) 10 MG tablet Take 10 mg by mouth daily.     aspirin EC 81 MG tablet Take 81 mg by mouth at bedtime.     Continuous Blood Gluc Sensor (FREESTYLE LIBRE 2 SENSOR) MISC 1 Device by Does not apply route every 14 (fourteen) days. 6 each 3   diclofenac Sodium (VOLTAREN) 1 % GEL Apply 1 application. topically daily as needed (leg pain).     DULoxetine (CYMBALTA) 60 MG capsule Take 60 mg by mouth daily.     ferrous sulfate 325 (65 FE) MG EC tablet Take 325 mg by mouth daily.     gabapentin (NEURONTIN) 600 MG tablet Take 600 mg by mouth 3 (three) times daily.     insulin aspart (NOVOLOG FLEXPEN) 100 UNIT/ML FlexPen Inject 4-8 Units into the skin 3 (three) times daily with meals. 15 mL 11   Insulin Pen Needle 32G X 4 MM MISC Use 2x a day 200 each 3   latanoprost (XALATAN) 0.005 % ophthalmic solution 1 drop at bedtime.     lipase/protease/amylase 24000-76000 units CPEP Take 1 capsule (24,000 Units total) by mouth 3 (three) times daily before meals. (Patient taking differently: Take 24,000 Units by mouth 3 (three) times daily before meals. Take 1 tablet before meals and 1 tablet after meals and 1 tablet with a snack) 270 capsule 0    lovastatin (MEVACOR) 20 MG tablet Take 20 mg by mouth at bedtime.     metFORMIN (GLUCOPHAGE-XR) 500 MG 24 hr tablet Take 1 tablet (500 mg total) by mouth 2 (two) times daily with a meal. 180 tablet 3   metoprolol succinate (TOPROL-XL) 50 MG 24 hr tablet Take 50 mg by mouth daily. Take with or immediately following a meal.     Multiple Vitamins-Minerals (CENTRUM SILVER 50+WOMEN) TABS Take 1 tablet by mouth daily.     Netarsudil Dimesylate (RHOPRESSA) 0.02 % SOLN Rhopressa 0.02 % eye drops     Netarsudil-Latanoprost (ROCKLATAN) 0.02-0.005 % SOLN Place 1 drop into both eyes daily.     pantoprazole (PROTONIX) 40 MG tablet Take 40 mg by mouth daily.     polyethylene glycol (MIRALAX / GLYCOLAX) 17 g packet Take 17 g by mouth daily as needed. 14 each 0   temazepam (RESTORIL) 30 MG capsule Take 30 mg by mouth at bedtime as needed for sleep.  0   timolol (TIMOPTIC) 0.5 % ophthalmic solution Place 1 drop into both eyes every morning.     No current facility-administered medications for this visit.     Past Medical History:  Diagnosis Date   Bronchitis    Bulging disc    in neck   Depression with anxiety    Diabetes mellitus    Type II   Fibromyalgia  pt. denies   GERD (gastroesophageal reflux disease)    Glaucoma    History of bronchitis    Hyperlipidemia    Hypertension    Overactive bladder    Peripheral vascular disease (HCC)    Pneumonia    Sleep apnea    recent test negative for sleep apnea   Tobacco abuse    Urinary frequency     Past Surgical History:  Procedure Laterality Date   ABDOMINAL AORTAGRAM N/A 08/15/2013   Procedure: ABDOMINAL Ronny Flurry;  Surgeon: Sherren Kerns, MD;  Location: [facility] CATH LAB;  Service: Cardiovascular;  Laterality: N/A;   ABDOMINAL AORTAGRAM N/A 06/19/2014   Procedure: ABDOMINAL Ronny Flurry;  Surgeon: Sherren Kerns, MD;  Location: [facility] CATH LAB;  Service: Cardiovascular;  Laterality: N/A;   ABDOMINAL AORTOGRAM W/LOWER EXTREMITY N/A 05/10/2018    Procedure: ABDOMINAL AORTOGRAM W/LOWER EXTREMITY;  Surgeon: Sherren Kerns, MD;  Location: [facility] INVASIVE CV LAB;  Service: Cardiovascular;  Laterality: N/A;  bilateral   Aortogram w/ PTA  05/14/08,  11-04-10   Bilateral aortogram w/ bilateral  SFA PTA  stenting    BREAST SURGERY Right    boil removal   BUNIONECTOMY     L foot in the 1980s   COLONOSCOPY     2014   DILATION AND CURETTAGE OF UTERUS     X4   ENDARTERECTOMY FEMORAL Right 06/03/2018   Procedure: RIGHT FEMORAL ENDARTERECTOMY;  Surgeon: Sherren Kerns, MD;  Location: [facility] OR;  Service: Vascular;  Laterality: Right;   Epidural shots in neck      ESOPHAGOGASTRODUODENOSCOPY (EGD) WITH PROPOFOL N/A 09/15/2019   Procedure: ESOPHAGOGASTRODUODENOSCOPY (EGD) WITH PROPOFOL;  Surgeon: Willis Modena, MD;  Location: [facility] ENDOSCOPY;  Service: Endoscopy;  Laterality: N/A;   EUS N/A 09/15/2019   Procedure: UPPER ENDOSCOPIC ULTRASOUND (EUS) LINEAR;  Surgeon: Willis Modena, MD;  Location: [facility] ENDOSCOPY;  Service: Endoscopy;  Laterality: N/A;   EYE SURGERY Bilateral 2016   cataract removal   FEMORAL-POPLITEAL BYPASS GRAFT Left 10/21/2013   Procedure: LEFT FEMORAL-POPLITEAL ARTERY BYPASS WITH SAPHENOUS VEIN GRAFT , POPLITEAL ENDARTERECTOMY ,INTRAOPERATIVE ARTERIOGRAM, vein patch angioplasty to popliteal artery;  Surgeon: Sherren Kerns, MD;  Location: [facility] OR;  Service: Vascular;  Laterality: Left;   FEMORAL-POPLITEAL BYPASS GRAFT Right 02/08/2016   Procedure: Right FEMORAL- to Above Knee POPLITEAL ARTERY Bypass Graft with reversed saphenous vein and Common Femoral Endarterectomy  with profundoplasty;  Surgeon: Sherren Kerns, MD;  Location: [facility] OR;  Service: Vascular;  Laterality: Right;   FINE NEEDLE ASPIRATION N/A 09/15/2019   Procedure: FINE NEEDLE ASPIRATION (FNA) LINEAR;  Surgeon: Willis Modena, MD;  Location: [facility] ENDOSCOPY;  Service: Endoscopy;  Laterality: N/A;   GROIN DEBRIDEMENT Left 10/28/2013   Procedure: left inner thigh DEBRIDEMENT;   Surgeon: Sherren Kerns, MD;  Location: [facility] OR;  Service: Vascular;  Laterality: Left;   HAND SURGERY Left 2010   LOWER EXTREMITY ANGIOGRAM Bilateral 12/24/2015   Procedure: Lower Extremity Angiogram;  Surgeon: Sherren Kerns, MD;  Location: [facility] INVASIVE CV LAB;  Service: Cardiovascular;  Laterality: Bilateral;   PATCH ANGIOPLASTY Right 06/03/2018   Procedure: PATCH ANGIOPLASTY USING A XENOSURE 1CM X 14CM BIOLOGIC PATCH;  Surgeon: Sherren Kerns, MD;  Location: [facility] OR;  Service: Vascular;  Laterality: Right;   PERIPHERAL VASCULAR CATHETERIZATION N/A 12/24/2015   Procedure: Abdominal Aortogram;  Surgeon: Sherren Kerns, MD;  Location: [facility] INVASIVE CV LAB;  Service: Cardiovascular;  Laterality: N/A;   PERIPHERAL VASCULAR CATHETERIZATION Left 12/24/2015   Procedure: Peripheral  Vascular Balloon Angioplasty;  Surgeon: Sherren Kerns, MD;  Location: [facility] INVASIVE CV LAB;  Service: Cardiovascular;  Laterality: Left;  drug coated balloon   PERIPHERAL VASCULAR CATHETERIZATION N/A 06/30/2016   Procedure: Abdominal Aortogram;  Surgeon: Sherren Kerns, MD;  Location: [facility] INVASIVE CV LAB;  Service: Cardiovascular;  Laterality: N/A;   PERIPHERAL VASCULAR CATHETERIZATION Bilateral 06/30/2016   Procedure: Lower Extremity Angiography;  Surgeon: Sherren Kerns, MD;  Location: [facility] INVASIVE CV LAB;  Service: Cardiovascular;  Laterality: Bilateral;   TONSILLECTOMY      Social History   Socioeconomic History   Marital status: Single    Spouse name: Not on file   Number of children: 1   Years of education: Not on file   Highest education level: Not on file  Occupational History   Occupation: Retired    Associate Professor: Scientist, water quality  Tobacco Use   Smoking status: Former    Packs/day: .1    Types: Cigarettes    Quit date: 10/2014    Years since quitting: 8.3    Passive exposure: Never   Smokeless tobacco: Never   Tobacco comments:    pt states that she is using the E cig only   Vaping Use   Vaping Use: Some days   Substance and Sexual Activity   Alcohol use: Yes    Alcohol/week: 0.0 - 1.0 standard drinks of alcohol    Comment: Occasional; 10-22 rarely    Drug use: No   Sexual activity: Not on file  Other Topics Concern   Not on file  Social History Narrative   Drinks about 2 cups of coffee a day, and some tea    Social Determinants of Health   Financial Resource Strain: Not on file  Food Insecurity: Not on file  Transportation Needs: Not on file  Physical Activity: Not on file  Stress: Not on file  Social Connections: Not on file  Intimate Partner Violence: Not on file    Family History  Problem Relation Age of Onset   Heart disease Father    Heart attack Father        MI at age 28   Hypertension Mother    Alzheimer's disease Mother    Hypertension Sister    Diabetes Brother    Hyperlipidemia Brother    Hypertension Brother    Heart disease Brother    Heart attack Brother    Breast cancer Maternal Aunt     ROS: no fevers or chills, productive cough, hemoptysis, dysphasia, odynophagia, melena, hematochezia, dysuria, hematuria, rash, seizure activity, orthopnea, PND, pedal edema, claudication. Remaining systems are negative.  Physical Exam: Well-developed well-nourished in no acute distress.  Skin is warm and dry.  HEENT is normal.  Neck is supple.  Chest is clear to auscultation with normal expansion.  Cardiovascular exam is regular rate and rhythm.  Abdominal exam nontender or distended. No masses palpated. Extremities show no edema. neuro grossly intact  ECG- personally reviewed  A/P  1 history of syncope-patient has had no recurrences and LV function is normal.  Will follow.  2 hypertension-patient's blood pressure is controlled.  Continue present medical regimen.  3 hyperlipidemia-continue statin.  4 peripheral vascular disease-patient is followed by vascular surgery.  Continue statin.  Olga Millers, MD

## 2023-03-07 DIAGNOSIS — E1169 Type 2 diabetes mellitus with other specified complication: Secondary | ICD-10-CM | POA: Diagnosis not present

## 2023-03-07 DIAGNOSIS — E1165 Type 2 diabetes mellitus with hyperglycemia: Secondary | ICD-10-CM | POA: Diagnosis not present

## 2023-03-07 DIAGNOSIS — G4700 Insomnia, unspecified: Secondary | ICD-10-CM | POA: Diagnosis not present

## 2023-03-07 DIAGNOSIS — I10 Essential (primary) hypertension: Secondary | ICD-10-CM | POA: Diagnosis not present

## 2023-03-07 DIAGNOSIS — J449 Chronic obstructive pulmonary disease, unspecified: Secondary | ICD-10-CM | POA: Diagnosis not present

## 2023-03-07 DIAGNOSIS — E7800 Pure hypercholesterolemia, unspecified: Secondary | ICD-10-CM | POA: Diagnosis not present

## 2023-03-07 DIAGNOSIS — K219 Gastro-esophageal reflux disease without esophagitis: Secondary | ICD-10-CM | POA: Diagnosis not present

## 2023-03-08 ENCOUNTER — Other Ambulatory Visit: Payer: Self-pay | Admitting: *Deleted

## 2023-03-08 DIAGNOSIS — Z95828 Presence of other vascular implants and grafts: Secondary | ICD-10-CM

## 2023-03-14 ENCOUNTER — Ambulatory Visit: Payer: Medicare HMO

## 2023-03-14 ENCOUNTER — Ambulatory Visit: Payer: Medicare HMO | Admitting: Cardiology

## 2023-03-14 ENCOUNTER — Telehealth: Payer: Self-pay

## 2023-03-14 NOTE — Telephone Encounter (Signed)
Appt has been r/s

## 2023-03-16 ENCOUNTER — Ambulatory Visit (HOSPITAL_COMMUNITY): Payer: Medicare HMO

## 2023-03-16 ENCOUNTER — Ambulatory Visit (HOSPITAL_COMMUNITY): Admission: RE | Admit: 2023-03-16 | Payer: Medicare HMO | Source: Ambulatory Visit

## 2023-03-21 ENCOUNTER — Ambulatory Visit: Payer: Medicare HMO

## 2023-03-26 ENCOUNTER — Ambulatory Visit (INDEPENDENT_AMBULATORY_CARE_PROVIDER_SITE_OTHER)
Admission: RE | Admit: 2023-03-26 | Discharge: 2023-03-26 | Disposition: A | Discharge status: Home / Self Care | Payer: Medicare HMO | Source: Ambulatory Visit | Attending: Physician Assistant | Admitting: Physician Assistant

## 2023-03-26 ENCOUNTER — Ambulatory Visit (HOSPITAL_COMMUNITY)
Admission: RE | Admit: 2023-03-26 | Discharge: 2023-03-26 | Disposition: A | Discharge status: Home / Self Care | Payer: Medicare HMO | Source: Ambulatory Visit | Attending: Physician Assistant | Admitting: Physician Assistant

## 2023-03-26 DIAGNOSIS — Z95828 Presence of other vascular implants and grafts: Secondary | ICD-10-CM | POA: Insufficient documentation

## 2023-03-26 LAB — VAS US ABI WITH/WO TBI
Left ABI: 1.06
Right ABI: 1.05

## 2023-04-04 ENCOUNTER — Ambulatory Visit: Payer: Medicare HMO | Admitting: Physician Assistant

## 2023-04-04 VITALS — BP 136/77 | HR 78 | Temp 97.6°F | Wt 105.0 lb

## 2023-04-04 DIAGNOSIS — T82858A Stenosis of vascular prosthetic devices, implants and grafts, initial encounter: Secondary | ICD-10-CM | POA: Diagnosis not present

## 2023-04-04 NOTE — Progress Notes (Signed)
Office Note     CC:  follow up Requesting Provider:  Merri Brunette, MD  HPI: Jill Higgins is a 77 y.o. (01-11-1946) female who presents for routine follow up chronic abdominal pain peripheral artery disease. She has remote history of  Left fem-popliteal vein bypass in December of 2014 by Dr. Darrick Penna, and right femoral to above knee popliteal vein bypass in March of 2017 by Dr. Darrick Penna. She also had to latera have a redo right femoral endarterectomy in July of 2019 by Dr. Darrick Penna due to disabling claudication.   Today she reports severe pain in her right groin that radiates around to her hip. This occurs both at rest and on ambulation. She describes it as a deep aching pain " like a tooth ache" with intermittent sharp pains that shoot down to her foot. She says this is usually worse when she tries to elevate her legs. She has bilateral swelling that has been present for several years. She says she cannot tolerate elevating recently due to her groin pain. She sleeps in a recliner but is forced to sleep almost upright due to the pain. She says her legs themselves do not cause any pain on ambulation unless she walks a very far distance. She has no pain at rest in her feet. No non healing wounds.   She has chronic abdominal pain with chronic pancreatitis. She underwent a whipple procedure in December of 2020. At the time she had CT showing mesenteric stenosis. However very atypical symptoms for mesenteric ischemia. She was last seen in July of 2023 by Dr. Lenell Antu and was scheduled for CTA follow up. No intervention indicated as felt not to need it with celiac artery stenosis but patent IMA and SMA. She is without any significant abdominal pain, no food fear, weight loss, post prandial pain. She does get very intermittent nausea. She has irregular bowel movements with chronic diarrhea but this has been an ongoing issue. She also reports urinary incontinence. She is followed by GI for these issues.   Past  Medical History:  Diagnosis Date   Bronchitis    Bulging disc    in neck   Depression with anxiety    Diabetes mellitus    Type II   Fibromyalgia    pt. denies   GERD (gastroesophageal reflux disease)    Glaucoma    History of bronchitis    Hyperlipidemia    Hypertension    Overactive bladder    Peripheral vascular disease (HCC)    Pneumonia    Sleep apnea    recent test negative for sleep apnea   Tobacco abuse    Urinary frequency     Past Surgical History:  Procedure Laterality Date   ABDOMINAL AORTAGRAM N/A 08/15/2013   Procedure: ABDOMINAL Ronny Flurry;  Surgeon: Sherren Kerns, MD;  Location[facility] CATH LAB;  Service: Cardiovascular;  Laterality: N/A;   ABDOMINAL AORTAGRAM N/A 06/19/2014   Procedure: ABDOMINAL Ronny Flurry;  Surgeon: Sherren Kerns, MD;  Location: [facility] CATH LAB;  Service: Cardiovascular;  Laterality: N/A;   ABDOMINAL AORTOGRAM W/LOWER EXTREMITY N/A 05/10/2018   Procedure: ABDOMINAL AORTOGRAM W/LOWER EXTREMITY;  Surgeon: Sherren Kerns, MD;  Location: [facility] INVASIVE CV LAB;  Service: Cardiovascular;  Laterality: N/A;  bilateral   Aortogram w/ PTA  05/14/08,  11-04-10   Bilateral aortogram w/ bilateral  SFA PTA  stenting    BREAST SURGERY Right    boil removal   BUNIONECTOMY     L foot in the 1980s  COLONOSCOPY     2014   DILATION AND CURETTAGE OF UTERUS     X4   ENDARTERECTOMY FEMORAL Right 06/03/2018   Procedure: RIGHT FEMORAL ENDARTERECTOMY;  Surgeon: Sherren Kerns, MD;  Location: [facility] OR;  Service: Vascular;  Laterality: Right;   Epidural shots in neck      ESOPHAGOGASTRODUODENOSCOPY (EGD) WITH PROPOFOL N/A 09/15/2019   Procedure: ESOPHAGOGASTRODUODENOSCOPY (EGD) WITH PROPOFOL;  Surgeon: Willis Modena, MD;  Location: [facility] ENDOSCOPY;  Service: Endoscopy;  Laterality: N/A;   EUS N/A 09/15/2019   Procedure: UPPER ENDOSCOPIC ULTRASOUND (EUS) LINEAR;  Surgeon: Willis Modena, MD;  Location: [facility] ENDOSCOPY;  Service: Endoscopy;  Laterality: N/A;   EYE SURGERY  Bilateral 2016   cataract removal   FEMORAL-POPLITEAL BYPASS GRAFT Left 10/21/2013   Procedure: LEFT FEMORAL-POPLITEAL ARTERY BYPASS WITH SAPHENOUS VEIN GRAFT , POPLITEAL ENDARTERECTOMY ,INTRAOPERATIVE ARTERIOGRAM, vein patch angioplasty to popliteal artery;  Surgeon: Sherren Kerns, MD;  Location: [facility] OR;  Service: Vascular;  Laterality: Left;   FEMORAL-POPLITEAL BYPASS GRAFT Right 02/08/2016   Procedure: Right FEMORAL- to Above Knee POPLITEAL ARTERY Bypass Graft with reversed saphenous vein and Common Femoral Endarterectomy  with profundoplasty;  Surgeon: Sherren Kerns, MD;  Location: [facility] OR;  Service: Vascular;  Laterality: Right;   FINE NEEDLE ASPIRATION N/A 09/15/2019   Procedure: FINE NEEDLE ASPIRATION (FNA) LINEAR;  Surgeon: Willis Modena, MD;  Location: [facility] ENDOSCOPY;  Service: Endoscopy;  Laterality: N/A;   GROIN DEBRIDEMENT Left 10/28/2013   Procedure: left inner thigh DEBRIDEMENT;  Surgeon: Sherren Kerns, MD;  Location: [facility] OR;  Service: Vascular;  Laterality: Left;   HAND SURGERY Left 2010   LOWER EXTREMITY ANGIOGRAM Bilateral 12/24/2015   Procedure: Lower Extremity Angiogram;  Surgeon: Sherren Kerns, MD;  Location: [facility] INVASIVE CV LAB;  Service: Cardiovascular;  Laterality: Bilateral;   PATCH ANGIOPLASTY Right 06/03/2018   Procedure: PATCH ANGIOPLASTY USING A XENOSURE 1CM X 14CM BIOLOGIC PATCH;  Surgeon: Sherren Kerns, MD;  Location: [facility] OR;  Service: Vascular;  Laterality: Right;   PERIPHERAL VASCULAR CATHETERIZATION N/A 12/24/2015   Procedure: Abdominal Aortogram;  Surgeon: Sherren Kerns, MD;  Location: [facility] INVASIVE CV LAB;  Service: Cardiovascular;  Laterality: N/A;   PERIPHERAL VASCULAR CATHETERIZATION Left 12/24/2015   Procedure: Peripheral Vascular Balloon Angioplasty;  Surgeon: Sherren Kerns, MD;  Location: [facility] INVASIVE CV LAB;  Service: Cardiovascular;  Laterality: Left;  drug coated balloon   PERIPHERAL VASCULAR CATHETERIZATION N/A 06/30/2016   Procedure: Abdominal  Aortogram;  Surgeon: Sherren Kerns, MD;  Location: [facility] INVASIVE CV LAB;  Service: Cardiovascular;  Laterality: N/A;   PERIPHERAL VASCULAR CATHETERIZATION Bilateral 06/30/2016   Procedure: Lower Extremity Angiography;  Surgeon: Sherren Kerns, MD;  Location: [facility] INVASIVE CV LAB;  Service: Cardiovascular;  Laterality: Bilateral;   TONSILLECTOMY      Social History   Socioeconomic History   Marital status: Single    Spouse name: Not on file   Number of children: 1   Years of education: Not on file   Highest education level: Not on file  Occupational History   Occupation: Retired    Associate Professor: Scientist, water quality  Tobacco Use   Smoking status: Former    Packs/day: .1    Types: Cigarettes    Quit date: 10/2014    Years since quitting: 8.4    Passive exposure: Never   Smokeless tobacco: Never   Tobacco comments:    pt states that she is using the E cig only   Vaping Use  Vaping Use: Some days  Substance and Sexual Activity   Alcohol use: Yes    Alcohol/week: 0.0 - 1.0 standard drinks of alcohol    Comment: Occasional; 10-22 rarely    Drug use: No   Sexual activity: Not on file  Other Topics Concern   Not on file  Social History Narrative   Drinks about 2 cups of coffee a day, and some tea    Social Determinants of Health   Financial Resource Strain: Not on file  Food Insecurity: Not on file  Transportation Needs: Not on file  Physical Activity: Not on file  Stress: Not on file  Social Connections: Not on file  Intimate Partner Violence: Not on file     Family History  Problem Relation Age of Onset   Heart disease Father    Heart attack Father        MI at age 75   Hypertension Mother    Alzheimer's disease Mother    Hypertension Sister    Diabetes Brother    Hyperlipidemia Brother    Hypertension Brother    Heart disease Brother    Heart attack Brother    Breast cancer Maternal Aunt     Current Outpatient Medications  Medication Sig Dispense Refill   amLODipine  (NORVASC) 10 MG tablet Take 10 mg by mouth daily.     aspirin EC 81 MG tablet Take 81 mg by mouth at bedtime.     Continuous Blood Gluc Sensor (FREESTYLE LIBRE 2 SENSOR) MISC 1 Device by Does not apply route every 14 (fourteen) days. 6 each 3   diclofenac Sodium (VOLTAREN) 1 % GEL Apply 1 application. topically daily as needed (leg pain).     DULoxetine (CYMBALTA) 60 MG capsule Take 60 mg by mouth daily.     ferrous sulfate 325 (65 FE) MG EC tablet Take 325 mg by mouth daily.     gabapentin (NEURONTIN) 600 MG tablet Take 600 mg by mouth 3 (three) times daily.     insulin aspart (NOVOLOG FLEXPEN) 100 UNIT/ML FlexPen Inject 4-8 Units into the skin 3 (three) times daily with meals. 15 mL 11   Insulin Pen Needle 32G X 4 MM MISC Use 2x a day 200 each 3   latanoprost (XALATAN) 0.005 % ophthalmic solution 1 drop at bedtime.     lipase/protease/amylase 24000-76000 units CPEP Take 1 capsule (24,000 Units total) by mouth 3 (three) times daily before meals. (Patient taking differently: Take 24,000 Units by mouth 3 (three) times daily before meals. Take 1 tablet before meals and 1 tablet after meals and 1 tablet with a snack) 270 capsule 0   lovastatin (MEVACOR) 20 MG tablet Take 20 mg by mouth at bedtime.     metFORMIN (GLUCOPHAGE-XR) 500 MG 24 hr tablet Take 1 tablet (500 mg total) by mouth 2 (two) times daily with a meal. 180 tablet 3   metoprolol succinate (TOPROL-XL) 50 MG 24 hr tablet Take 50 mg by mouth daily. Take with or immediately following a meal.     Multiple Vitamins-Minerals (CENTRUM SILVER 50+WOMEN) TABS Take 1 tablet by mouth daily.     Netarsudil Dimesylate (RHOPRESSA) 0.02 % SOLN Rhopressa 0.02 % eye drops     Netarsudil-Latanoprost (ROCKLATAN) 0.02-0.005 % SOLN Place 1 drop into both eyes daily.     pantoprazole (PROTONIX) 40 MG tablet Take 40 mg by mouth daily.     polyethylene glycol (MIRALAX / GLYCOLAX) 17 g packet Take 17 g by mouth daily as needed.  14 each 0   temazepam (RESTORIL) 30  MG capsule Take 30 mg by mouth at bedtime as needed for sleep.  0   timolol (TIMOPTIC) 0.5 % ophthalmic solution Place 1 drop into both eyes every morning.     No current facility-administered medications for this visit.    Allergies  Allergen Reactions   Wound Dressing Adhesive Other (See Comments) and Rash    Pt states that tape and electrodes leave red "scars" on her skin and her skin is very sensitive.  Paper tape ok to use      REVIEW OF SYSTEMS:  [X]  denotes positive finding, [ ]  denotes negative finding Cardiac  Comments:  Chest pain or chest pressure:    Shortness of breath upon exertion:    Short of breath when lying flat:    Irregular heart rhythm:        Vascular    Pain in calf, thigh, or hip brought on by ambulation:    Pain in feet at night that wakes you up from your sleep:     Blood clot in your veins:    Leg swelling:  X       Pulmonary    Oxygen at home:    Productive cough:     Wheezing:         Neurologic    Sudden weakness in arms or legs:     Sudden numbness in arms or legs:     Sudden onset of difficulty speaking or slurred speech:    Temporary loss of vision in one eye:     Problems with dizziness:         Gastrointestinal    Blood in stool:     Vomited blood:         Genitourinary    Burning when urinating:     Blood in urine:        Psychiatric    Major depression:         Hematologic    Bleeding problems:    Problems with blood clotting too easily:        Skin    Rashes or ulcers:        Constitutional    Fever or chills:      PHYSICAL EXAMINATION:  Vitals:   04/04/23 1351  BP: 136/77  Pulse: 78  Temp: 97.6 F (36.4 C)  TempSrc: Temporal  SpO2: 94%  Weight: 105 lb (47.6 kg)    General:  WDWN in NAD; vital signs documented above Gait: Normal HENT: WNL, normocephalic Pulmonary: normal non-labored breathing , without  wheezing Cardiac: regular HR Abdomen: soft, NT, no masses Vascular Exam/Pulses: 2+ femoral pulses  bilaterally, no palpable distal pulses. Doppler DP/ PT pulses bilaterally Extremities: without ischemic changes, without Gangrene , without cellulitis; without open wounds Musculoskeletal: no muscle wasting or atrophy  Neurologic: A&O X 3 Psychiatric:  The pt has Normal affect.   Non-Invasive Vascular Imaging:    +----------+--------+-----+---------------+--------+--------+  RIGHT    PSV cm/sRatioStenosis       WaveformComments  +----------+--------+-----+---------------+--------+--------+  EIA [id number]          50-74% stenosisbiphasic          +----------+--------+-----+---------------+--------+--------+  POP Mid   11                          biphasic          +----------+--------+-----+---------------+--------+--------+     Right Graft #1:  +------------------+--------+--------+---------+--------+  PSV cm/sStenosisWaveform Comments  +------------------+--------+--------+---------+--------+  Inflow           152             biphasic           +------------------+--------+--------+---------+--------+  Prox Anastomosis  66              biphasic           +------------------+--------+--------+---------+--------+  Proximal Graft    81              biphasic           +------------------+--------+--------+---------+--------+  Mid Graft         71              biphasic           +------------------+--------+--------+---------+--------+  Distal Graft      62              triphasic          +------------------+--------+--------+---------+--------+  Distal Anastomosis103             biphasic           +------------------+--------+--------+---------+--------+  Outflow          147             biphasic           +------------------+--------+--------+---------+--------+   +--------+--------+-----+--------+--------+--------+  LEFT   PSV cm/sRatioStenosisWaveformComments   +--------+--------+-----+--------+--------+--------+  CFA Prox193                  biphasic          +--------+--------+-----+--------+--------+--------+  POP Mid 95                   biphasic          +--------+--------+-----+--------+--------+--------+     Left Graft #1: Left femoral to AK popliteal bypass graft  +--------------------+--------+---------------+---------+--------+                     PSV cm/sStenosis       Waveform Comments  +--------------------+--------+---------------+---------+--------+  Inflow             205     50-74% stenosisbiphasic           +--------------------+--------+---------------+---------+--------+  Proximal Anastomosis374     >70% stenosis  biphasic           +--------------------+--------+---------------+---------+--------+  Proximal Graft      151                    biphasic           +--------------------+--------+---------------+---------+--------+  Mid Graft           72                     biphasic           +--------------------+--------+---------------+---------+--------+  Distal Graft        62                     biphasic           +--------------------+--------+---------------+---------+--------+  Distal Anastomosis  95                     triphasic          +--------------------+--------+---------------+---------+--------+  Outflow            109  biphasic           +--------------------+--------+---------------+---------+--------+   Summary:  Right: Patent right femoral to popliteal bypass graft with no evidence of restenosis. Increased velocity in the distal EIA suggestive of 50 - 74% stenosis.    Left: 50-74% stenosis noted in the inflow arterty and >70% stenosis noted in the proximal anastomosis.   +-------+-----------+-----------+------------+------------+  ABI/TBIToday's ABIToday's TBIPrevious ABIPrevious TBI   +-------+-----------+-----------+------------+------------+  Right 1.05       0.88       1.09        0.83          +-------+-----------+-----------+------------+------------+  Left  1.06       0.84       1.07        0.75          +-------+-----------+-----------+------------+------------+   ASSESSMENT/PLAN:: 77 y.o. female here for follow up for peripheral artery disease. She has atherosclerosis of the native arteries of the Bilateral lower extremities causing  Hip claudication on the right lower extremity. She has no current symptoms on the left lower extremity . The patient is on best medical therapy for peripheral arterial disease with Aspirin and statin. The patient has been counseled about the risks of tobacco use in atherosclerotic disease. The patient has been counseled to abstain from any tobacco use. An aortogram with bilateral lower extremity runoff angiography and Bilateral lower extremity intervention and is indicated to better evaluate the patient's  lower extremity circulation because of the limb threatening nature of the patient's diagnosis. Based on the patient's clinical exam and non-invasive data, we anticipate an endovascular intervention in the right iliac and femoropopliteal vessels of the left. Her ABIs today are stable. However her duplex shows elevated velocities in her right external iliac artery. These have been present for a while but are increasing and may be contributing to her her current symptoms. Her right lower extremity bypass is patent without any stenosis. Her duplex of her left lower extremity bypass shows elevated velocities at the inflow. This is concerning for a threatened bypass graft. I therefore have recommended possible intervention. I will arrange Aortogram in the near future with Dr. Randie Heinz.    Graceann Congress, PA-C [facility] [phone]  Clinic MD:   [place]/ Randie Heinz

## 2023-04-04 NOTE — H&P (View-Only) (Signed)
Office Note     CC:  follow up Requesting Provider:  Wussow, Candace, MD  HPI: Jill Higgins is a 76 y.o. (10/06/1946) female who presents for routine follow up chronic abdominal pain peripheral artery disease. She has remote history of  Left fem-popliteal vein bypass in December of 2014 by Dr. Fields, and right femoral to above knee popliteal vein bypass in March of 2017 by Dr. Fields. She also had to latera have a redo right femoral endarterectomy in July of 2019 by Dr. Fields due to disabling claudication.   Today she reports severe pain in her right groin that radiates around to her hip. This occurs both at rest and on ambulation. She describes it as a deep aching pain " like a tooth ache" with intermittent sharp pains that shoot down to her foot. She says this is usually worse when she tries to elevate her legs. She has bilateral swelling that has been present for several years. She says she cannot tolerate elevating recently due to her groin pain. She sleeps in a recliner but is forced to sleep almost upright due to the pain. She says her legs themselves do not cause any pain on ambulation unless she walks a very far distance. She has no pain at rest in her feet. No non healing wounds.   She has chronic abdominal pain with chronic pancreatitis. She underwent a whipple procedure in December of 2020. At the time she had CT showing mesenteric stenosis. However very atypical symptoms for mesenteric ischemia. She was last seen in July of 2023 by Dr. Hawken and was scheduled for CTA follow up. No intervention indicated as felt not to need it with celiac artery stenosis but patent IMA and SMA. She is without any significant abdominal pain, no food fear, weight loss, post prandial pain. She does get very intermittent nausea. She has irregular bowel movements with chronic diarrhea but this has been an ongoing issue. She also reports urinary incontinence. She is followed by GI for these issues.   Past  Medical History:  Diagnosis Date   Bronchitis    Bulging disc    in neck   Depression with anxiety    Diabetes mellitus    Type II   Fibromyalgia    pt. denies   GERD (gastroesophageal reflux disease)    Glaucoma    History of bronchitis    Hyperlipidemia    Hypertension    Overactive bladder    Peripheral vascular disease (HCC)    Pneumonia    Sleep apnea    recent test negative for sleep apnea   Tobacco abuse    Urinary frequency     Past Surgical History:  Procedure Laterality Date   ABDOMINAL AORTAGRAM N/A 08/15/2013   Procedure: ABDOMINAL AORTAGRAM;  Surgeon: Charles E Fields, MD;  Location: [facility];  Service: Cardiovascular;  Laterality: N/A;   ABDOMINAL AORTAGRAM N/A 06/19/2014   Procedure: ABDOMINAL AORTAGRAM;  Surgeon: Charles E Fields, MD;  Location: [facility];  Service: Cardiovascular;  Laterality: N/A;   ABDOMINAL AORTOGRAM W/LOWER EXTREMITY N/A 05/10/2018   Procedure: ABDOMINAL AORTOGRAM W/LOWER EXTREMITY;  Surgeon: Fields, Charles E, MD;  Location: [facility];  Service: Cardiovascular;  Laterality: N/A;  bilateral   Aortogram w/ PTA  05/14/08,  11-04-10   Bilateral aortogram w/ bilateral  SFA PTA  stenting    BREAST SURGERY Right    boil removal   BUNIONECTOMY     L foot in the 1980s     COLONOSCOPY     2014   DILATION AND CURETTAGE OF UTERUS     X4   ENDARTERECTOMY FEMORAL Right 06/03/2018   Procedure: RIGHT FEMORAL ENDARTERECTOMY;  Surgeon: Fields, Charles E, MD;  Location: [facility];  Service: Vascular;  Laterality: Right;   Epidural shots in neck      ESOPHAGOGASTRODUODENOSCOPY (EGD) WITH PROPOFOL N/A 09/15/2019   Procedure: ESOPHAGOGASTRODUODENOSCOPY (EGD) WITH PROPOFOL;  Surgeon: Outlaw, William, MD;  Location: [facility];  Service: Endoscopy;  Laterality: N/A;   EUS N/A 09/15/2019   Procedure: UPPER ENDOSCOPIC ULTRASOUND (EUS) LINEAR;  Surgeon: Outlaw, William, MD;  Location: [facility];  Service: Endoscopy;  Laterality: N/A;   EYE SURGERY  Bilateral 2016   cataract removal   FEMORAL-POPLITEAL BYPASS GRAFT Left 10/21/2013   Procedure: LEFT FEMORAL-POPLITEAL ARTERY BYPASS WITH SAPHENOUS VEIN GRAFT , POPLITEAL ENDARTERECTOMY ,INTRAOPERATIVE ARTERIOGRAM, vein patch angioplasty to popliteal artery;  Surgeon: Charles E Fields, MD;  Location: [facility];  Service: Vascular;  Laterality: Left;   FEMORAL-POPLITEAL BYPASS GRAFT Right 02/08/2016   Procedure: Right FEMORAL- to Above Knee POPLITEAL ARTERY Bypass Graft with reversed saphenous vein and Common Femoral Endarterectomy  with profundoplasty;  Surgeon: Charles E Fields, MD;  Location: [facility];  Service: Vascular;  Laterality: Right;   FINE NEEDLE ASPIRATION N/A 09/15/2019   Procedure: FINE NEEDLE ASPIRATION (FNA) LINEAR;  Surgeon: Outlaw, William, MD;  Location: [facility];  Service: Endoscopy;  Laterality: N/A;   GROIN DEBRIDEMENT Left 10/28/2013   Procedure: left inner thigh DEBRIDEMENT;  Surgeon: Charles E Fields, MD;  Location: [facility];  Service: Vascular;  Laterality: Left;   HAND SURGERY Left 2010   LOWER EXTREMITY ANGIOGRAM Bilateral 12/24/2015   Procedure: Lower Extremity Angiogram;  Surgeon: Charles E Fields, MD;  Location: [facility];  Service: Cardiovascular;  Laterality: Bilateral;   PATCH ANGIOPLASTY Right 06/03/2018   Procedure: PATCH ANGIOPLASTY USING A XENOSURE 1CM X 14CM BIOLOGIC PATCH;  Surgeon: Fields, Charles E, MD;  Location: [facility];  Service: Vascular;  Laterality: Right;   PERIPHERAL VASCULAR CATHETERIZATION N/A 12/24/2015   Procedure: Abdominal Aortogram;  Surgeon: Charles E Fields, MD;  Location: [facility];  Service: Cardiovascular;  Laterality: N/A;   PERIPHERAL VASCULAR CATHETERIZATION Left 12/24/2015   Procedure: Peripheral Vascular Balloon Angioplasty;  Surgeon: Charles E Fields, MD;  Location: [facility];  Service: Cardiovascular;  Laterality: Left;  drug coated balloon   PERIPHERAL VASCULAR CATHETERIZATION N/A 06/30/2016   Procedure: Abdominal  Aortogram;  Surgeon: Charles E Fields, MD;  Location: [facility];  Service: Cardiovascular;  Laterality: N/A;   PERIPHERAL VASCULAR CATHETERIZATION Bilateral 06/30/2016   Procedure: Lower Extremity Angiography;  Surgeon: Charles E Fields, MD;  Location: [facility];  Service: Cardiovascular;  Laterality: Bilateral;   TONSILLECTOMY      Social History   Socioeconomic History   Marital status: Single    Spouse name: Not on file   Number of children: 1   Years of education: Not on file   Highest education level: Not on file  Occupational History   Occupation: Retired    Employer: [employer]  Tobacco Use   Smoking status: Former    Packs/day: .1    Types: Cigarettes    Quit date: 10/2014    Years since quitting: 8.4    Passive exposure: Never   Smokeless tobacco: Never   Tobacco comments:    pt states that she is using the E cig only   Vaping Use     Vaping Use: Some days  Substance and Sexual Activity   Alcohol use: Yes    Alcohol/week: 0.0 - 1.0 standard drinks of alcohol    Comment: Occasional; 10-22 rarely    Drug use: No   Sexual activity: Not on file  Other Topics Concern   Not on file  Social History Narrative   Drinks about 2 cups of coffee a day, and some tea    Social Determinants of Health   Financial Resource Strain: Not on file  Food Insecurity: Not on file  Transportation Needs: Not on file  Physical Activity: Not on file  Stress: Not on file  Social Connections: Not on file  Intimate Partner Violence: Not on file     Family History  Problem Relation Age of Onset   Heart disease Father    Heart attack Father        MI at age 72   Hypertension Mother    Alzheimer's disease Mother    Hypertension Sister    Diabetes Brother    Hyperlipidemia Brother    Hypertension Brother    Heart disease Brother    Heart attack Brother    Breast cancer Maternal Aunt     Current Outpatient Medications  Medication Sig Dispense Refill   amLODipine  (NORVASC) 10 MG tablet Take 10 mg by mouth daily.     aspirin EC 81 MG tablet Take 81 mg by mouth at bedtime.     Continuous Blood Gluc Sensor (FREESTYLE LIBRE 2 SENSOR) MISC 1 Device by Does not apply route every 14 (fourteen) days. 6 each 3   diclofenac Sodium (VOLTAREN) 1 % GEL Apply 1 application. topically daily as needed (leg pain).     DULoxetine (CYMBALTA) 60 MG capsule Take 60 mg by mouth daily.     ferrous sulfate 325 (65 FE) MG EC tablet Take 325 mg by mouth daily.     gabapentin (NEURONTIN) 600 MG tablet Take 600 mg by mouth 3 (three) times daily.     insulin aspart (NOVOLOG FLEXPEN) 100 UNIT/ML FlexPen Inject 4-8 Units into the skin 3 (three) times daily with meals. 15 mL 11   Insulin Pen Needle 32G X 4 MM MISC Use 2x a day 200 each 3   latanoprost (XALATAN) 0.005 % ophthalmic solution 1 drop at bedtime.     lipase/protease/amylase 24000-76000 units CPEP Take 1 capsule (24,000 Units total) by mouth 3 (three) times daily before meals. (Patient taking differently: Take 24,000 Units by mouth 3 (three) times daily before meals. Take 1 tablet before meals and 1 tablet after meals and 1 tablet with a snack) 270 capsule 0   lovastatin (MEVACOR) 20 MG tablet Take 20 mg by mouth at bedtime.     metFORMIN (GLUCOPHAGE-XR) 500 MG 24 hr tablet Take 1 tablet (500 mg total) by mouth 2 (two) times daily with a meal. 180 tablet 3   metoprolol succinate (TOPROL-XL) 50 MG 24 hr tablet Take 50 mg by mouth daily. Take with or immediately following a meal.     Multiple Vitamins-Minerals (CENTRUM SILVER 50+WOMEN) TABS Take 1 tablet by mouth daily.     Netarsudil Dimesylate (RHOPRESSA) 0.02 % SOLN Rhopressa 0.02 % eye drops     Netarsudil-Latanoprost (ROCKLATAN) 0.02-0.005 % SOLN Place 1 drop into both eyes daily.     pantoprazole (PROTONIX) 40 MG tablet Take 40 mg by mouth daily.     polyethylene glycol (MIRALAX / GLYCOLAX) 17 g packet Take 17 g by mouth daily as needed.   14 each 0   temazepam (RESTORIL) 30  MG capsule Take 30 mg by mouth at bedtime as needed for sleep.  0   timolol (TIMOPTIC) 0.5 % ophthalmic solution Place 1 drop into both eyes every morning.     No current facility-administered medications for this visit.    Allergies  Allergen Reactions   Wound Dressing Adhesive Other (See Comments) and Rash    Pt states that tape and electrodes leave red "scars" on her skin and her skin is very sensitive.  Paper tape ok to use      REVIEW OF SYSTEMS:  [X] denotes positive finding, [ ] denotes negative finding Cardiac  Comments:  Chest pain or chest pressure:    Shortness of breath upon exertion:    Short of breath when lying flat:    Irregular heart rhythm:        Vascular    Pain in calf, thigh, or hip brought on by ambulation:    Pain in feet at night that wakes you up from your sleep:     Blood clot in your veins:    Leg swelling:  X       Pulmonary    Oxygen at home:    Productive cough:     Wheezing:         Neurologic    Sudden weakness in arms or legs:     Sudden numbness in arms or legs:     Sudden onset of difficulty speaking or slurred speech:    Temporary loss of vision in one eye:     Problems with dizziness:         Gastrointestinal    Blood in stool:     Vomited blood:         Genitourinary    Burning when urinating:     Blood in urine:        Psychiatric    Major depression:         Hematologic    Bleeding problems:    Problems with blood clotting too easily:        Skin    Rashes or ulcers:        Constitutional    Fever or chills:      PHYSICAL EXAMINATION:  Vitals:   04/04/23 1351  BP: 136/77  Pulse: 78  Temp: 97.6 F (36.4 C)  TempSrc: Temporal  SpO2: 94%  Weight: 105 lb (47.6 kg)    General:  WDWN in NAD; vital signs documented above Gait: Normal HENT: WNL, normocephalic Pulmonary: normal non-labored breathing , without  wheezing Cardiac: regular HR Abdomen: soft, NT, no masses Vascular Exam/Pulses: 2+ femoral pulses  bilaterally, no palpable distal pulses. Doppler DP/ PT pulses bilaterally Extremities: without ischemic changes, without Gangrene , without cellulitis; without open wounds Musculoskeletal: no muscle wasting or atrophy  Neurologic: A&O X 3 Psychiatric:  The pt has Normal affect.   Non-Invasive Vascular Imaging:    +----------+--------+-----+---------------+--------+--------+  RIGHT    PSV cm/sRatioStenosis       WaveformComments  +----------+--------+-----+---------------+--------+--------+  EIA Distal344          50-74% stenosisbiphasic          +----------+--------+-----+---------------+--------+--------+  POP Mid   11                          biphasic          +----------+--------+-----+---------------+--------+--------+     Right Graft #1:  +------------------+--------+--------+---------+--------+                     PSV cm/sStenosisWaveform Comments  +------------------+--------+--------+---------+--------+  Inflow           152             biphasic           +------------------+--------+--------+---------+--------+  Prox Anastomosis  66              biphasic           +------------------+--------+--------+---------+--------+  Proximal Graft    81              biphasic           +------------------+--------+--------+---------+--------+  Mid Graft         71              biphasic           +------------------+--------+--------+---------+--------+  Distal Graft      62              triphasic          +------------------+--------+--------+---------+--------+  Distal Anastomosis103             biphasic           +------------------+--------+--------+---------+--------+  Outflow          147             biphasic           +------------------+--------+--------+---------+--------+   +--------+--------+-----+--------+--------+--------+  LEFT   PSV cm/sRatioStenosisWaveformComments   +--------+--------+-----+--------+--------+--------+  CFA Prox193                  biphasic          +--------+--------+-----+--------+--------+--------+  POP Mid 95                   biphasic          +--------+--------+-----+--------+--------+--------+     Left Graft #1: Left femoral to AK popliteal bypass graft  +--------------------+--------+---------------+---------+--------+                     PSV cm/sStenosis       Waveform Comments  +--------------------+--------+---------------+---------+--------+  Inflow             205     50-74% stenosisbiphasic           +--------------------+--------+---------------+---------+--------+  Proximal Anastomosis374     >70% stenosis  biphasic           +--------------------+--------+---------------+---------+--------+  Proximal Graft      151                    biphasic           +--------------------+--------+---------------+---------+--------+  Mid Graft           72                     biphasic           +--------------------+--------+---------------+---------+--------+  Distal Graft        62                     biphasic           +--------------------+--------+---------------+---------+--------+  Distal Anastomosis  95                     triphasic          +--------------------+--------+---------------+---------+--------+  Outflow            109                      biphasic           +--------------------+--------+---------------+---------+--------+   Summary:  Right: Patent right femoral to popliteal bypass graft with no evidence of restenosis. Increased velocity in the distal EIA suggestive of 50 - 74% stenosis.    Left: 50-74% stenosis noted in the inflow arterty and >70% stenosis noted in the proximal anastomosis.   +-------+-----------+-----------+------------+------------+  ABI/TBIToday's ABIToday's TBIPrevious ABIPrevious TBI   +-------+-----------+-----------+------------+------------+  Right 1.05       0.88       1.09        0.83          +-------+-----------+-----------+------------+------------+  Left  1.06       0.84       1.07        0.75          +-------+-----------+-----------+------------+------------+   ASSESSMENT/PLAN:: 76 y.o. female here for follow up for peripheral artery disease. She has atherosclerosis of the native arteries of the Bilateral lower extremities causing  Hip claudication on the right lower extremity. She has no current symptoms on the left lower extremity . The patient is on best medical therapy for peripheral arterial disease with Aspirin and statin. The patient has been counseled about the risks of tobacco use in atherosclerotic disease. The patient has been counseled to abstain from any tobacco use. An aortogram with bilateral lower extremity runoff angiography and Bilateral lower extremity intervention and is indicated to better evaluate the patient's  lower extremity circulation because of the limb threatening nature of the patient's diagnosis. Based on the patient's clinical exam and non-invasive data, we anticipate an endovascular intervention in the right iliac and femoropopliteal vessels of the left. Her ABIs today are stable. However her duplex shows elevated velocities in her right external iliac artery. These have been present for a while but are increasing and may be contributing to her her current symptoms. Her right lower extremity bypass is patent without any stenosis. Her duplex of her left lower extremity bypass shows elevated velocities at the inflow. This is concerning for a threatened bypass graft. I therefore have recommended possible intervention. I will arrange Aortogram in the near future with Dr. Cain.    Resa Rinks, PA-C [facility] [phone]  Clinic MD:   [facility]/ Cain  

## 2023-04-10 ENCOUNTER — Other Ambulatory Visit: Payer: Self-pay

## 2023-04-10 ENCOUNTER — Telehealth: Payer: Self-pay

## 2023-04-10 DIAGNOSIS — T82858A Stenosis of vascular prosthetic devices, implants and grafts, initial encounter: Secondary | ICD-10-CM

## 2023-04-10 NOTE — Telephone Encounter (Signed)
Attempted to reach patient to schedule aortogram, no answer. Left VM to return call.

## 2023-04-10 NOTE — Telephone Encounter (Signed)
Patient returned call. Procedure scheduled for 6/10 with instructions provided. Patient verbalized understanding.

## 2023-04-17 NOTE — Addendum Note (Signed)
Addended by: Primitivo Gauze on: 04/17/2023 10:13 AM   Modules accepted: Orders

## 2023-04-17 NOTE — Telephone Encounter (Signed)
Patient called complaining of pain in lower extremities when walking and does not feel she can wait until 6/10 for aortogram. Offered to change appt to 5/31 with Dr. Lenell Antu, who patient has seen previously. Patient agreed to appt. Recommended patient to take Tylenol ES if not contraindicated for pain management. She voiced understanding.

## 2023-04-19 ENCOUNTER — Ambulatory Visit: Payer: Medicare HMO

## 2023-04-20 ENCOUNTER — Encounter (HOSPITAL_COMMUNITY)
Admission: RE | Disposition: A | Discharge status: Home / Self Care | Payer: Self-pay | Source: Ambulatory Visit | Attending: Vascular Surgery

## 2023-04-20 ENCOUNTER — Other Ambulatory Visit: Payer: Self-pay

## 2023-04-20 ENCOUNTER — Observation Stay (HOSPITAL_COMMUNITY)
Admission: RE | Admit: 2023-04-20 | Discharge: 2023-04-21 | Disposition: A | Discharge status: Home / Self Care | Payer: Medicare HMO | Source: Ambulatory Visit | Attending: Vascular Surgery | Admitting: Vascular Surgery

## 2023-04-20 DIAGNOSIS — I739 Peripheral vascular disease, unspecified: Principal | ICD-10-CM | POA: Diagnosis present

## 2023-04-20 DIAGNOSIS — Z794 Long term (current) use of insulin: Secondary | ICD-10-CM | POA: Diagnosis not present

## 2023-04-20 DIAGNOSIS — Z7982 Long term (current) use of aspirin: Secondary | ICD-10-CM | POA: Insufficient documentation

## 2023-04-20 DIAGNOSIS — Z7984 Long term (current) use of oral hypoglycemic drugs: Secondary | ICD-10-CM | POA: Diagnosis not present

## 2023-04-20 DIAGNOSIS — I70211 Atherosclerosis of native arteries of extremities with intermittent claudication, right leg: Secondary | ICD-10-CM | POA: Diagnosis not present

## 2023-04-20 DIAGNOSIS — Z87891 Personal history of nicotine dependence: Secondary | ICD-10-CM | POA: Diagnosis not present

## 2023-04-20 DIAGNOSIS — I10 Essential (primary) hypertension: Secondary | ICD-10-CM | POA: Diagnosis not present

## 2023-04-20 DIAGNOSIS — T82858A Stenosis of vascular prosthetic devices, implants and grafts, initial encounter: Secondary | ICD-10-CM

## 2023-04-20 DIAGNOSIS — M6281 Muscle weakness (generalized): Secondary | ICD-10-CM | POA: Insufficient documentation

## 2023-04-20 DIAGNOSIS — Z79899 Other long term (current) drug therapy: Secondary | ICD-10-CM | POA: Insufficient documentation

## 2023-04-20 HISTORY — PX: ABDOMINAL AORTOGRAM W/LOWER EXTREMITY: CATH118223

## 2023-04-20 LAB — COMPREHENSIVE METABOLIC PANEL
ALT: 18 U/L (ref 0–44)
AST: 30 U/L (ref 15–41)
Albumin: 3.8 g/dL (ref 3.5–5.0)
Alkaline Phosphatase: 96 U/L (ref 38–126)
Anion gap: 11 (ref 5–15)
BUN: 14 mg/dL (ref 8–23)
CO2: 22 mmol/L (ref 22–32)
Calcium: 8.9 mg/dL (ref 8.9–10.3)
Chloride: 102 mmol/L (ref 98–111)
Creatinine, Ser: 0.76 mg/dL (ref 0.44–1.00)
GFR, Estimated: >60 mL/min (ref >60)
Glucose, Bld: 109 mg/dL — ABNORMAL HIGH (ref 70–99)
Potassium: 4 mmol/L (ref 3.5–5.1)
Sodium: 135 mmol/L (ref 135–145)
Total Bilirubin: 0.5 mg/dL (ref 0.3–1.2)
Total Protein: 6.9 g/dL (ref 6.5–8.1)

## 2023-04-20 LAB — POCT ACTIVATED CLOTTING TIME
Activated Clotting Time: 222 seconds
Activated Clotting Time: 223 seconds
Activated Clotting Time: 185 seconds
Activated Clotting Time: 174 seconds

## 2023-04-20 LAB — CBC
HCT: 43.4 % (ref 36.0–46.0)
Hemoglobin: 14.2 g/dL (ref 12.0–15.0)
MCH: 31.6 pg (ref 26.0–34.0)
MCHC: 32.7 g/dL (ref 30.0–36.0)
MCV: 96.4 fL (ref 80.0–100.0)
Platelets: 260 10*3/uL (ref 150–400)
RBC: 4.5 MIL/uL (ref 3.87–5.11)
RDW: 13.2 % (ref 11.5–15.5)
WBC: 6.7 10*3/uL (ref 4.0–10.5)
nRBC: 0 % (ref 0.0–0.2)

## 2023-04-20 LAB — PROTIME-INR
INR: 0.9 (ref 0.8–1.2)
Prothrombin Time: 12.8 seconds (ref 11.4–15.2)

## 2023-04-20 LAB — POCT I-STAT, CHEM 8
BUN: 19 mg/dL (ref 8–23)
Calcium, Ion: 1.19 mmol/L (ref 1.15–1.40)
Chloride: 104 mmol/L (ref 98–111)
Creatinine, Ser: 0.8 mg/dL (ref 0.44–1.00)
Glucose, Bld: 108 mg/dL — ABNORMAL HIGH (ref 70–99)
HCT: 38 % (ref 36.0–46.0)
Hemoglobin: 12.9 g/dL (ref 12.0–15.0)
Potassium: 4 mmol/L (ref 3.5–5.1)
Sodium: 140 mmol/L (ref 135–145)
TCO2: 25 mmol/L (ref 22–32)

## 2023-04-20 LAB — GLUCOSE, CAPILLARY
Glucose-Capillary: 122 mg/dL — ABNORMAL HIGH (ref 70–99)
Glucose-Capillary: 172 mg/dL — ABNORMAL HIGH (ref 70–99)

## 2023-04-20 SURGERY — ABDOMINAL AORTOGRAM W/LOWER EXTREMITY
Anesthesia: LOCAL

## 2023-04-20 MED ORDER — CLOPIDOGREL BISULFATE 300 MG PO TABS
ORAL_TABLET | ORAL | Status: AC
  Filled 2023-04-20: qty 1

## 2023-04-20 MED ORDER — ONDANSETRON HCL 4 MG/2ML IJ SOLN: 4.0000 mg | Freq: Four times a day (QID) | INTRAMUSCULAR | Status: DC | PRN

## 2023-04-20 MED ORDER — ACETAMINOPHEN 325 MG PO TABS: 325.0000 mg | ORAL_TABLET | ORAL | Status: DC | PRN

## 2023-04-20 MED ORDER — CLOPIDOGREL BISULFATE 75 MG PO TABS
75.0000 mg | ORAL_TABLET | Freq: Every day | ORAL | Status: DC
  Administered 2023-04-21: 75 mg via ORAL
  Filled 2023-04-20: qty 1

## 2023-04-20 MED ORDER — FENTANYL CITRATE (PF) 100 MCG/2ML IJ SOLN
INTRAMUSCULAR | Status: AC
  Filled 2023-04-20: qty 2

## 2023-04-20 MED ORDER — ACETAMINOPHEN 325 MG PO TABS
650.0000 mg | ORAL_TABLET | ORAL | Status: DC | PRN
  Administered 2023-04-20: 650 mg via ORAL

## 2023-04-20 MED ORDER — ACETAMINOPHEN 325 MG PO TABS
ORAL_TABLET | ORAL | Status: AC
  Filled 2023-04-20: qty 2

## 2023-04-20 MED ORDER — LIDOCAINE HCL (PF) 1 % IJ SOLN
INTRAMUSCULAR | Status: DC | PRN
  Administered 2023-04-20 (×2): 5 mL

## 2023-04-20 MED ORDER — POTASSIUM CHLORIDE CRYS ER 20 MEQ PO TBCR: 20.0000 meq | EXTENDED_RELEASE_TABLET | Freq: Once | ORAL | Status: DC

## 2023-04-20 MED ORDER — ACETAMINOPHEN 325 MG RE SUPP: 325.0000 mg | RECTAL | Status: DC | PRN

## 2023-04-20 MED ORDER — METOPROLOL TARTRATE 5 MG/5ML IV SOLN: 2.0000 mg | INTRAVENOUS | Status: DC | PRN

## 2023-04-20 MED ORDER — LABETALOL HCL 5 MG/ML IV SOLN: 10.0000 mg | INTRAVENOUS | Status: DC | PRN

## 2023-04-20 MED ORDER — LIDOCAINE HCL (CARDIAC) PF 100 MG/5ML IV SOSY
PREFILLED_SYRINGE | INTRAVENOUS | Status: AC
  Filled 2023-04-20: qty 5

## 2023-04-20 MED ORDER — GUAIFENESIN-DM 100-10 MG/5ML PO SYRP: 15.0000 mL | ORAL_SOLUTION | ORAL | Status: DC | PRN

## 2023-04-20 MED ORDER — ASPIRIN 81 MG PO CHEW
CHEWABLE_TABLET | ORAL | Status: AC
  Filled 2023-04-20: qty 1

## 2023-04-20 MED ORDER — ASPIRIN 81 MG PO CHEW
CHEWABLE_TABLET | ORAL | Status: DC | PRN
  Administered 2023-04-20: 81 mg via ORAL

## 2023-04-20 MED ORDER — MIDAZOLAM HCL 2 MG/2ML IJ SOLN
INTRAMUSCULAR | Status: AC
  Filled 2023-04-20: qty 2

## 2023-04-20 MED ORDER — SODIUM CHLORIDE 0.9 % IV SOLN: INTRAVENOUS | Status: DC

## 2023-04-20 MED ORDER — SODIUM CHLORIDE 0.9 % WEIGHT BASED INFUSION: 1.0000 mL/kg/h | INTRAVENOUS | Status: AC

## 2023-04-20 MED ORDER — FENTANYL CITRATE (PF) 100 MCG/2ML IJ SOLN
INTRAMUSCULAR | Status: DC | PRN
  Administered 2023-04-20: 50 ug via INTRAVENOUS

## 2023-04-20 MED ORDER — HYDRALAZINE HCL 20 MG/ML IJ SOLN
5.0000 mg | INTRAMUSCULAR | Status: DC | PRN
  Administered 2023-04-20: 5 mg via INTRAVENOUS
  Filled 2023-04-20: qty 1

## 2023-04-20 MED ORDER — IODIXANOL 320 MG/ML IV SOLN
INTRAVENOUS | Status: DC | PRN
  Administered 2023-04-20: 135 mL via INTRA_ARTERIAL

## 2023-04-20 MED ORDER — CLOPIDOGREL BISULFATE 300 MG PO TABS
ORAL_TABLET | ORAL | Status: DC | PRN
  Administered 2023-04-20: 300 mg via ORAL

## 2023-04-20 MED ORDER — ASPIRIN 81 MG PO TBEC
81.0000 mg | DELAYED_RELEASE_TABLET | Freq: Every day | ORAL | Status: DC
  Administered 2023-04-21: 81 mg via ORAL
  Filled 2023-04-20: qty 1

## 2023-04-20 MED ORDER — MIDAZOLAM HCL 2 MG/2ML IJ SOLN
INTRAMUSCULAR | Status: DC | PRN
  Administered 2023-04-20: 1 mg via INTRAVENOUS

## 2023-04-20 MED ORDER — HYDRALAZINE HCL 20 MG/ML IJ SOLN: 5.0000 mg | INTRAMUSCULAR | Status: DC | PRN

## 2023-04-20 MED ORDER — MORPHINE SULFATE (PF) 2 MG/ML IV SOLN
2.0000 mg | INTRAVENOUS | Status: DC | PRN
  Administered 2023-04-20 (×2): 2 mg via INTRAVENOUS
  Filled 2023-04-20 (×2): qty 1

## 2023-04-20 MED ORDER — HEPARIN SODIUM (PORCINE) 1000 UNIT/ML IJ SOLN
INTRAMUSCULAR | Status: AC
  Filled 2023-04-20: qty 10

## 2023-04-20 MED ORDER — ALUM & MAG HYDROXIDE-SIMETH 200-200-20 MG/5ML PO SUSP: 15.0000 mL | ORAL | Status: DC | PRN

## 2023-04-20 MED ORDER — HEPARIN SODIUM (PORCINE) 1000 UNIT/ML IJ SOLN
INTRAMUSCULAR | Status: DC | PRN
  Administered 2023-04-20: 6000 [IU] via INTRAVENOUS

## 2023-04-20 MED ORDER — CLOPIDOGREL BISULFATE 75 MG PO TABS: 300.0000 mg | ORAL_TABLET | Freq: Once | ORAL | Status: DC

## 2023-04-20 MED ORDER — SODIUM CHLORIDE 0.9% FLUSH
3.0000 mL | Freq: Two times a day (BID) | INTRAVENOUS | Status: DC
  Administered 2023-04-21: 3 mL via INTRAVENOUS

## 2023-04-20 MED ORDER — PHENOL 1.4 % MT LIQD: 1.0000 | OROMUCOSAL | Status: DC | PRN

## 2023-04-20 MED ORDER — PANTOPRAZOLE SODIUM 40 MG PO TBEC
40.0000 mg | DELAYED_RELEASE_TABLET | Freq: Every day | ORAL | Status: DC
  Administered 2023-04-21: 40 mg via ORAL
  Filled 2023-04-20: qty 1

## 2023-04-20 MED ORDER — SODIUM CHLORIDE 0.9% FLUSH: 3.0000 mL | INTRAVENOUS | Status: DC | PRN

## 2023-04-20 MED ORDER — SODIUM CHLORIDE 0.9 % IV SOLN: 250.0000 mL | INTRAVENOUS | Status: DC | PRN

## 2023-04-20 MED ORDER — HEPARIN SODIUM (PORCINE) 5000 UNIT/ML IJ SOLN
5000.0000 [IU] | Freq: Three times a day (TID) | INTRAMUSCULAR | Status: DC
  Administered 2023-04-20 – 2023-04-21 (×2): 5000 [IU] via SUBCUTANEOUS
  Filled 2023-04-20 (×3): qty 1

## 2023-04-20 MED ORDER — OXYCODONE-ACETAMINOPHEN 5-325 MG PO TABS
1.0000 | ORAL_TABLET | ORAL | Status: DC | PRN
  Administered 2023-04-20: 1 via ORAL
  Administered 2023-04-20: 2 via ORAL
  Administered 2023-04-21: 1 via ORAL
  Filled 2023-04-20: qty 1
  Filled 2023-04-20: qty 2
  Filled 2023-04-20: qty 1
  Filled 2023-04-20: qty 2

## 2023-04-20 MED ORDER — HEPARIN (PORCINE) IN NACL 1000-0.9 UT/500ML-% IV SOLN
INTRAVENOUS | Status: DC | PRN
  Administered 2023-04-20 (×2): 500 mL

## 2023-04-20 MED ORDER — LIDOCAINE HCL (PF) 1 % IJ SOLN
INTRAMUSCULAR | Status: AC
  Filled 2023-04-20: qty 30

## 2023-04-20 SURGICAL SUPPLY — 20 items
CANISTER PENUMBRA ENGINE (MISCELLANEOUS) IMPLANT
CATH ANGIO 5F BER2 65CM (CATHETERS) IMPLANT
CATH LIGHTNING 7 XTORQ 130 (CATHETERS) IMPLANT
CATH OMNI FLUSH 5F 65CM (CATHETERS) IMPLANT
CATH TEMPO AQUA 5F 100CM (CATHETERS) IMPLANT
GUIDEWIRE ANGLED .035X150CM (WIRE) IMPLANT
KIT MICROPUNCTURE NIT STIFF (SHEATH) IMPLANT
KIT PV (KITS) ×2 IMPLANT
SHEATH CATAPULT 7FR 45 (SHEATH) IMPLANT
SHEATH PINNACLE 5F 10CM (SHEATH) IMPLANT
SHEATH PINNACLE 6F 10CM (SHEATH) IMPLANT
SHEATH PINNACLE 7F 10CM (SHEATH) IMPLANT
STENT VIABAHN 7X50X120 (Permanent Stent) ×1 IMPLANT
STENT VIABAHN 7X5X120 7FR (Permanent Stent) IMPLANT
SYR MEDRAD MARK 7 150ML (SYRINGE) ×2 IMPLANT
TRANSDUCER W/STOPCOCK (MISCELLANEOUS) ×2 IMPLANT
TRAY PV CATH (CUSTOM PROCEDURE TRAY) ×2 IMPLANT
TUBING CIL FLEX 10 FLL-RA (TUBING) IMPLANT
WIRE BENTSON .035X145CM (WIRE) IMPLANT
WIRE G V18X300CM (WIRE) IMPLANT

## 2023-04-20 NOTE — Progress Notes (Signed)
Dr. Juanetta Gosling paged.  Patient complaining of right groin and shin pain.  Right lower leg near ankle to foot cooler to touch than left lower leg/foot.  Right DP and PT pulses doppled.  Tylenol 650 mg given at 1153 am.

## 2023-04-20 NOTE — Progress Notes (Signed)
24F arterial sheath removed from left groin.  Manual pressure held for 35 minutes.  Left groin site level 0 pre and post pull.  Left DP pulse doppled post sheath pull.  Bedrest started at 1400.  Gauze and tegaderm applied to left groin.  Instructions reviewed with patient.

## 2023-04-20 NOTE — Progress Notes (Signed)
Dr. Lenell Antu came by and saw patient at 1245 pm.  He said it is ok.

## 2023-04-20 NOTE — Interval H&P Note (Signed)
History and Physical Interval Note:  04/20/2023 10:48 AM  Jill Higgins  has presented today for surgery, with the diagnosis of bypass graft stenosis.  The various methods of treatment have been discussed with the patient and family. After consideration of risks, benefits and other options for treatment, the patient has consented to  Procedure(s): ABDOMINAL AORTOGRAM W/LOWER EXTREMITY (N/A) as a surgical intervention.  The patient's history has been reviewed, patient examined, no change in status, stable for surgery.  I have reviewed the patient's chart and labs.  Questions were answered to the patient's satisfaction.     Leonie Douglas

## 2023-04-20 NOTE — Op Note (Signed)
DATE OF SERVICE: 04/20/2023  PATIENT:  Jill Higgins  77 y.o. 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 Lenell Antu, MD  ASSISTANT: none  ANESTHESIA:   local and IV sedation  ESTIMATED BLOOD LOSS: minimal  LOCAL MEDICATIONS USED:  LIDOCAINE   COUNTS: confirmed correct.  PATIENT DISPOSITION:  PACU - hemodynamically stable.   Delay start of Pharmacological VTE agent (>24hrs) due to surgical blood loss or risk of bleeding: no  INDICATION FOR PROCEDURE: Jill Higgins is a 77 y.o. female with right leg discomfort.  Noninvasive vascular testing was performed and showed possible right external iliac artery stenosis.  There was also possible proximal left bypass graft anastomotic stenosis.  After careful discussion of risks, benefits, and alternatives the patient was offered angiography. The patient understood and wished to proceed.  OPERATIVE FINDINGS:  Renal arteries patent Terminal aorta patent Bilateral iliac arteries widely patent Minimal pressure differential between the right common femoral artery and terminal aorta (10 to 13 mmHg) No angiographic evidence of right external iliac artery stenosis as suggested on duplex ultrasound  Left lower extremity: Common  femoral artery: Patent Profunda femoris artery: Patent Common femoral to popliteal bypass: Patent Superficial femoral artery: Occluded Common femoral - above knee popliteal artery: patent without stenosis Popliteal artery: reconstitutes above the knee Anterior tibial artery: abnormally high takeoff behind the knee; dominant tibial. Fills the foot  Tibioperoneal trunk: patent Peroneal artery: patent to ankle Posterior tibial artery: occluded Pedal circulation: fills via AT/DP  During deployment of closure device, Mynx device maldeployed. I evaluated the leg clinically. Ultrasound showed possible deployment of the Mynx in the common femoral artery. I elected to study the right leg in more detail and possibly intervene.  Right leg angiography confirmed suspicion of Mynx device partially deployed in common femoral artery with associated thrombus.  This was successfully treated with covered stenting of the right external iliac / common femoral artery.  Right lower extremity runoff was then performed.  Common femoral artery: patent  Profunda femoris artery: patent  Common femoral - above knee popliteal bypass: patent Popliteal artery: reconstitutes above the knee Anterior tibial artery: patent with diffuse stenosis ~50% Peroneal artery: patent with diffuse stenosis ~50% Posterior tibial artery: patent with diffuse stenosis ~50% Pedal circulation: disadvantaged - fills via AT > PT  DESCRIPTION OF PROCEDURE: After identification of the patient in the pre-operative holding area, the patient was transferred to the operating room. The patient was positioned supine on the operating room table. Anesthesia was induced. The groins was prepped and draped in standard fashion. A surgical pause was performed confirming correct patient, procedure, and operative location.  The right groin was anesthetized with subcutaneous injection of 1% lidocaine. Using ultrasound guidance, the right common femoral artery  was accessed with micropuncture technique. Fluoroscopy was used to confirm cannulation over the femoral head. The 20F sheath was upsized to 34F.   A Benson wire was  advanced into the distal aorta. Over the wire an omni flush catheter was advanced to the level of L2. Aortogram was performed - see above for details.   The left common iliac artery was selected with an omniflush catheter and glidewire guidewire. The wire was advanced into the common femoral artery. Over the wire the omni flush catheter was advanced into the external iliac artery. Selective angiography was performed - see above for details.   A mynx device was used to close the arteriotomy. This did not deploy appropriately. I was concerned this deployed "deep" into the lumen of the artery. I assessed this with ultrasound which was worrisome for this finding. The left groin was prepped and draped to assess this angiographically and possibly intervene.  The left groin was anesthetized with subcutaneous injection of 1% lidocaine. Using ultrasound guidance, the right common femoral artery was accessed with micropuncture technique. Fluoroscopy was used to confirm cannulation over the femoral head. The 1040F sheath was upsized to 39F.   The right common iliac artery was selected with an omniflush catheter and glidewire guidewire. The wire was advanced into the common femoral artery. Over the wire the omni flush catheter was advanced into the external iliac artery. Selective angiography was performed - see above for details.   The decision was made to intervene. Access was upsized to 40F x 45cm. The lesion in the common femoral artery was crossed with an 018 wire. A 7x3mm Viabahn stent was used to treat the lesion in the right groin. Follow up angiogram showed resolution of the stenosis caused by the closure device.   Runoff angiography was performed down the right leg. See above for details. Flow was confirmed into the foot, but this appeared  chronically disadvantaged.   Conscious sedation was administered with the use of IV fentanyl and midazolam under continuous physician and nurse monitoring.  Heart rate, blood pressure, and oxygen saturation were continuously monitored.  Total sedation time was 88 minutes  Upon completion of the case instrument and sharps counts were confirmed correct. The patient was transferred to the PACU in good condition. I was present for all portions of the procedure.  PLAN: ASA 81mg  PO QD. Plavix 75mg  PO QD. High intensity statin therapy. Observe overnight.  Rande Brunt. Lenell Antu, MD [facility] Phone Number: [phone] 04/20/2023 10:48 AM

## 2023-04-21 DIAGNOSIS — Z7982 Long term (current) use of aspirin: Secondary | ICD-10-CM | POA: Diagnosis not present

## 2023-04-21 DIAGNOSIS — Z794 Long term (current) use of insulin: Secondary | ICD-10-CM | POA: Diagnosis not present

## 2023-04-21 DIAGNOSIS — M6281 Muscle weakness (generalized): Secondary | ICD-10-CM | POA: Diagnosis not present

## 2023-04-21 DIAGNOSIS — I10 Essential (primary) hypertension: Secondary | ICD-10-CM | POA: Diagnosis not present

## 2023-04-21 DIAGNOSIS — Z79899 Other long term (current) drug therapy: Secondary | ICD-10-CM | POA: Diagnosis not present

## 2023-04-21 DIAGNOSIS — Z87891 Personal history of nicotine dependence: Secondary | ICD-10-CM | POA: Diagnosis not present

## 2023-04-21 DIAGNOSIS — I739 Peripheral vascular disease, unspecified: Secondary | ICD-10-CM | POA: Diagnosis not present

## 2023-04-21 DIAGNOSIS — Z7984 Long term (current) use of oral hypoglycemic drugs: Secondary | ICD-10-CM | POA: Diagnosis not present

## 2023-04-21 LAB — LIPID PANEL
Cholesterol: 121 mg/dL (ref 0–200)
HDL: 64 mg/dL (ref >40)
LDL Cholesterol: 38 mg/dL (ref 0–99)
Total CHOL/HDL Ratio: 1.9 RATIO
Triglycerides: 97 mg/dL (ref <150)
VLDL: 19 mg/dL (ref 0–40)

## 2023-04-21 MED ORDER — CLOPIDOGREL BISULFATE 75 MG PO TABS: 75.0000 mg | ORAL_TABLET | Freq: Every day | ORAL | 3 refills | Status: DC

## 2023-04-21 MED ORDER — ATORVASTATIN CALCIUM 80 MG PO TABS: 80.0000 mg | ORAL_TABLET | Freq: Every day | ORAL | 11 refills | Status: AC

## 2023-04-21 NOTE — Evaluation (Addendum)
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  PMH:   Left fem-popliteal vein bypass in December of 2014 by Dr. Darrick Penna, and right femoral to above knee popliteal vein bypass in March of 2017 by Dr. Darrick Penna. She also had to latera have a redo right femoral endarterectomy in July of 2019 by Dr. Darrick Penna due to disabling claudication; PAD, DM, HTN, depression  Clinical Impression  Pt admitted with above diagnosis. Pt able to ambulate with RW and without device in room with supervision. Pt refused to go out of room. Pt should progress well and didn't have any LOB.  C/o pain but was able to weight bear fairly well without device.   Pt currently with functional limitations due to the deficits listed below (see PT Problem List). Pt will benefit from acute skilled PT to increase their independence and safety with mobility to allow discharge.          Recommendations for follow up therapy are one component of a multi-disciplinary discharge planning process, led by the attending physician.  Recommendations may be updated based on patient status, additional functional criteria and insurance authorization.  Follow Up Recommendations       Assistance Recommended at Discharge PRN  Patient can return home with the following  A little help with walking and/or transfers;Assistance with cooking/housework;Assist for transportation    Equipment Recommendations 3N1  Recommendations for Other Services       Functional Status Assessment Patient has had a recent decline in their functional status and demonstrates the ability to make significant improvements in function in a reasonable and predictable  amount of time.     Precautions / Restrictions Precautions Precautions: Fall Restrictions Weight Bearing Restrictions: No      Mobility  Bed Mobility Overal bed mobility: Independent                  Transfers Overall transfer level: Independent                      Ambulation/Gait Ambulation/Gait assistance: Supervision Gait Distance (Feet): 50 Feet Assistive device: Rolling walker (2 wheels) Gait Pattern/deviations: Step-through pattern, Decreased stride length   Gait velocity interpretation: 1.31 - 2.62 ft/sec, indicative of limited community ambulator   General Gait Details: Pt transferred to the 3N1 initially to urinate.  Pt stated she cant urinate.  Gave pt a washcloth to freshen up and pt was able to wipe herself in standing with good balance. Pt encouraged to walk to door of room and pt stated she didnt want to. With encouragement, pt reluctantly agreed to walk.  She states she has LE pain however was not using RW to take pressure off of legs. Pt instructed on how to use RW however pt continued to not really use RW for unweighting LEs.  Pt is safe with and without RW as she can also take steps without UE support.  Pt refused to sit in recliner and got back into bed.  Stairs            Wheelchair Mobility    Modified Rankin (Stroke Patients Only)       Balance Overall balance assessment: No apparent balance deficits (not formally assessed)  Pertinent Vitals/Pain Pain Assessment Pain Assessment: Faces Faces Pain Scale: Hurts even more Pain Location: knees Pain Descriptors / Indicators: Aching, Grimacing, Guarding, Discomfort Pain Intervention(s): Limited activity within patient's tolerance, Monitored during session, Repositioned    Home Living Family/patient expects to be discharged to:: Private residence Living Arrangements: Alone Available Help at Discharge: Family;Available  PRN/intermittently Type of Home: Apartment Home Access: Level entry         Home Equipment: Rolling Walker (2 wheels);Tub bench;Grab bars - tub/shower;Cane - single point;BSC/3in1 Additional Comments: 3 falls last 6 months    Prior Function Prior Level of Function : Independent/Modified Independent;Driving;History of Falls (last six months)                     Hand Dominance   Dominant Hand: Right    Extremity/Trunk Assessment   Upper Extremity Assessment Upper Extremity Assessment: Defer to OT evaluation    Lower Extremity Assessment Lower Extremity Assessment: Generalized weakness    Cervical / Trunk Assessment Cervical / Trunk Assessment: Normal  Communication   Communication: No difficulties  Cognition Arousal/Alertness: Awake/alert Behavior During Therapy: WFL for tasks assessed/performed Overall Cognitive Status: Within Functional Limits for tasks assessed                                          General Comments General comments (skin integrity, edema, etc.): 59 bpm- 68 bpm    Exercises General Exercises - Lower Extremity Ankle Circles/Pumps: AROM, Both, 10 reps, Supine Heel Slides: AROM, Both, 10 reps, Supine   Assessment/Plan    PT Assessment Patient needs continued PT services  PT Problem List Decreased activity tolerance;Decreased balance;Decreased mobility;Decreased knowledge of use of DME;Decreased safety awareness;Decreased knowledge of precautions;Pain       PT Treatment Interventions DME instruction;Gait training;Functional mobility training;Therapeutic activities;Therapeutic exercise;Balance training;Patient/family education    PT Goals (Current goals can be found in the Care Plan section)  Acute Rehab PT Goals Patient Stated Goal: to go home PT Goal Formulation: With patient Time For Goal Achievement: 05/05/23 Potential to Achieve Goals: Good    Frequency Min 3X/week     Co-evaluation                AM-PAC PT "6 Clicks" Mobility  Outcome Measure Help needed turning from your back to your side while in a flat bed without using bedrails?: None Help needed moving from lying on your back to sitting on the side of a flat bed without using bedrails?: None Help needed moving to and from a bed to a chair (including a wheelchair)?: None Help needed standing up from a chair using your arms (e.g., wheelchair or bedside chair)?: None Help needed to walk in hospital room?: A Little Help needed climbing 3-5 steps with a railing? : A Little 6 Click Score: 22    End of Session Equipment Utilized During Treatment: Gait belt Activity Tolerance: Patient tolerated treatment well Patient left: in bed;with call bell/phone within reach;with bed alarm set Nurse Communication: Mobility status PT Visit Diagnosis: Muscle weakness (generalized) (M62.81);Pain Pain - Right/Left:  (bil) Pain - part of body: Leg    Time: 0454-0981 PT Time Calculation (min) (ACUTE ONLY): 19 min   Charges:   PT Evaluation $PT Eval Moderate Complexity: 1 Mod          Danford Tat M,PT Acute Rehab Services [phone]   Bevelyn Buckles 04/21/2023, 9:32 AM

## 2023-04-21 NOTE — Progress Notes (Signed)
PHARMACIST LIPID MONITORING   Jill Higgins is a 77 y.o. female admitted on 04/20/2023 with PAD.  Pharmacy has been consulted to optimize lipid-lowering therapy with the indication of secondary prevention for clinical ASCVD.  Recent Labs:  Lipid Panel (last 6 months):   Lab Results  Component Value Date   CHOL 121 04/21/2023   TRIG 97 04/21/2023   HDL 64 04/21/2023   CHOLHDL 1.9 04/21/2023   VLDL 19 04/21/2023   LDLCALC 38 04/21/2023    Hepatic function panel (last 6 months):   Lab Results  Component Value Date   AST 30 04/20/2023   ALT 18 04/20/2023   ALKPHOS 96 04/20/2023   BILITOT 0.5 04/20/2023    SCr (since admission):   Serum creatinine: 0.76 mg/dL 16/10/96 0454 Estimated creatinine clearance: 48.7 mL/min  Current therapy and lipid therapy tolerance Current lipid-lowering therapy: lovastatin 20mg  daily  Documented or reported allergies or intolerances to lipid-lowering therapies (if applicable): N/A  Assessment:   Patient agrees with changes to lipid-lowering therapy. Will initiate atorvastatin 80mg  once daily and stop home lovastatin. We discussed the size of the pill and she will let her PCP know if she cannot tolerate the larger pill.   Plan:    1.Statin intensity (high intensity recommended for all patients regardless of the LDL):  Add or increase statin to high intensity.  2.Add ezetimibe (if any one of the following):   Not indicated at this time.  3.Refer to lipid clinic:   No  4.Follow-up with:  PCP   5.Follow-up labs after discharge:  Changes in lipid therapy were made. Check a lipid panel in 8-12 weeks then annually.      Blane Ohara, PharmD  PGY1 Pharmacy Resident

## 2023-04-21 NOTE — Discharge Instructions (Signed)
  [facility] [place]  Discharge Instructions  Lower Extremity Angiogram; Angioplasty/Stenting  Please refer to the following instructions for your post-procedure care. Your surgeon or physician assistant will discuss any changes with you.  Activity  Avoid lifting more than 8 pounds (1 gallons of milk) for 5 days after your procedure. You may walk as much as you can tolerate. It's OK to drive after 72 hours.  Bathing/Showering  You may shower the day after your procedure. If you have a bandage, you may remove it at 24- 48 hours. Clean your incision site with mild soap and water. Pat the area dry with a clean towel.  Diet  Resume your pre-procedure diet. There are no special food restrictions following this procedure. All patients with peripheral vascular disease should follow a low fat/low cholesterol diet. In order to heal from your surgery, it is CRITICAL to get adequate nutrition. Your body requires vitamins, minerals, and protein. Vegetables are the best source of vitamins and minerals. Vegetables also provide the perfect balance of protein. Processed food has little nutritional value, so try to avoid this.  Medications  Resume taking all of your medications unless your doctor tells you not to. If your incision is causing pain, you may take over-the-counter pain relievers such as acetaminophen (Tylenol)  Follow Up  Follow up will be arranged at the time of your procedure. You may have an office visit scheduled or may be scheduled for surgery. Ask your surgeon if you have any questions.  Please call us immediately for any of the following conditions: .Severe or worsening pain your legs or feet at rest or with walking. .Increased pain, redness, drainage at your groin puncture site. .Fever of 101 degrees or higher. .If you have any mild or slow bleeding from your puncture site: lie down, apply firm constant pressure over the area with a piece of gauze or a  clean wash cloth for 30 minutes- no peeking!, call 911 right away if you are still bleeding after 30 minutes, or if the bleeding is heavy and unmanageable.  Reduce your risk factors of vascular disease:  . Stop smoking. If you would like help call [facility] at 1-800-QUIT-NOW (1-800-784-8669) or [place] at 336-586-4000. . Manage your cholesterol . Maintain a desired weight . Control your diabetes . Keep your blood pressure down .  If you have any questions, please call the office at [phone] 

## 2023-04-21 NOTE — Progress Notes (Addendum)
  Progress Note    04/21/2023 8:20 AM 1 Day Post-Op  Subjective:  continues to have pain in right hip down right leg. Says no change post procedure. This discomfort is worsened when trying to elevate her leg   Vitals:   04/21/23 0418 04/21/23 0744  BP: (!) 140/71 (!) 166/103  Pulse: 62 74  Resp: 15 15  Temp: 98.4 F (36.9 C) 98.2 F (36.8 C)  SpO2: 100%    Physical Exam: Cardiac:  regular Lungs:  non labored Incisions:  B femoral access sites c/d/I without swelling or hematoma Extremities:  Doppler Dp/ PT/ peroneal signals Abdomen:  soft Neurologic: alert and oriented  CBC    Component Value Date/Time   WBC 6.7 04/20/2023 1519   RBC 4.50 04/20/2023 1519   HGB 14.2 04/20/2023 1519   HCT 43.4 04/20/2023 1519   PLT 260 04/20/2023 1519   MCV 96.4 04/20/2023 1519   MCH 31.6 04/20/2023 1519   MCHC 32.7 04/20/2023 1519   RDW 13.2 04/20/2023 1519   LYMPHSABS 2.0 01/31/2022 0105   MONOABS 0.3 01/31/2022 0105   EOSABS 0.3 01/31/2022 0105   BASOSABS 0.0 01/31/2022 0105    BMET    Component Value Date/Time   NA 135 04/20/2023 1519   K 4.0 04/20/2023 1519   CL 102 04/20/2023 1519   CO2 22 04/20/2023 1519   GLUCOSE 109 (H) 04/20/2023 1519   BUN 14 04/20/2023 1519   CREATININE 0.76 04/20/2023 1519   CALCIUM 8.9 04/20/2023 1519   GFRNONAA >60 04/20/2023 1519   GFRAA >60 09/04/2019 0249    INR    Component Value Date/Time   INR 0.9 04/20/2023 1519     Intake/Output Summary (Last 24 hours) at 04/21/2023 0820 Last data filed at 04/21/2023 0744 Gross per 24 hour  Intake 749.54 ml  Output 2650 ml  Net -1900.46 ml     Assessment/Plan:  77 y.o. female is s/p Aortogram, Stenting of Right EIA and CFA 1 Day Post-Op   RLE pain unchanged Bilateral femoral access sites c/d/I without swelling or hematoma Ambulate this morning She does live alone so she is a little concerned about d/c Aspirin, Statin, Plavix  Will have PT work with her this morning Anticipate  discharge later today She will follow up in our office in 1 month with aorto/iliac duplex and ABIs  Graceann Congress, PA-C [facility] [phone] 04/21/2023 8:20 AM  I have seen and evaluated the patient. I agree with the PA note as documented above.  Underwent left lower extremity arteriogram yesterday from right transfemoral access and had mynx closure issues requiring stenting of the right external and common femoral artery with a Viabahn.  Both groins look good with no hematoma.  She has palpable DP pulses.  Aspirin Plavix.  Moving slow and states she has had frequent falls.  Will get therapy to evaluate her.  May need another day.  Cephus Shelling, MD [facility] [place] Office: [phone]

## 2023-04-23 DIAGNOSIS — F419 Anxiety disorder, unspecified: Secondary | ICD-10-CM | POA: Diagnosis not present

## 2023-04-23 DIAGNOSIS — J449 Chronic obstructive pulmonary disease, unspecified: Secondary | ICD-10-CM | POA: Diagnosis not present

## 2023-04-23 DIAGNOSIS — I10 Essential (primary) hypertension: Secondary | ICD-10-CM | POA: Diagnosis not present

## 2023-04-23 DIAGNOSIS — Z8719 Personal history of other diseases of the digestive system: Secondary | ICD-10-CM | POA: Diagnosis not present

## 2023-04-23 DIAGNOSIS — Z794 Long term (current) use of insulin: Secondary | ICD-10-CM | POA: Diagnosis not present

## 2023-04-23 DIAGNOSIS — E7800 Pure hypercholesterolemia, unspecified: Secondary | ICD-10-CM | POA: Diagnosis not present

## 2023-04-23 DIAGNOSIS — E1151 Type 2 diabetes mellitus with diabetic peripheral angiopathy without gangrene: Secondary | ICD-10-CM | POA: Diagnosis not present

## 2023-04-23 DIAGNOSIS — K219 Gastro-esophageal reflux disease without esophagitis: Secondary | ICD-10-CM | POA: Diagnosis not present

## 2023-04-23 DIAGNOSIS — G4700 Insomnia, unspecified: Secondary | ICD-10-CM | POA: Diagnosis not present

## 2023-04-24 ENCOUNTER — Encounter (HOSPITAL_COMMUNITY): Payer: Self-pay | Admitting: Vascular Surgery

## 2023-04-25 ENCOUNTER — Telehealth: Payer: Self-pay

## 2023-04-25 ENCOUNTER — Other Ambulatory Visit: Payer: Self-pay | Admitting: Internal Medicine

## 2023-04-25 NOTE — Telephone Encounter (Signed)
Caller: Patient  Concern: soreness on R side from groin to knee, not any better since surgery  Location: right leg  Description:  since surgery on 5/31  Aggravating Factors: movement, walking, standing, lying down  Quality:  soreness  Treatments: Instructed to take acetaminophen, ibuprofen (OTC), and elevation, warm compress, and compressions  Procedure: Interventional Vascular Procedure  Resolution: Instructed to call back if symptoms perist  Next Appt: Forwarded to schedulers to set up appointment with patient within the next 1 month from surgery on 5/31 with MD/PA & [place]

## 2023-04-27 ENCOUNTER — Encounter (HOSPITAL_COMMUNITY): Payer: Self-pay | Admitting: Emergency Medicine

## 2023-04-27 ENCOUNTER — Telehealth: Payer: Self-pay | Admitting: *Deleted

## 2023-04-27 ENCOUNTER — Other Ambulatory Visit: Payer: Self-pay

## 2023-04-27 ENCOUNTER — Emergency Department (HOSPITAL_COMMUNITY)
Admission: EM | Admit: 2023-04-27 | Discharge: 2023-04-27 | Disposition: A | Discharge status: Home / Self Care | Payer: Medicare HMO | Attending: Emergency Medicine | Admitting: Emergency Medicine

## 2023-04-27 ENCOUNTER — Emergency Department (HOSPITAL_COMMUNITY): Payer: Medicare HMO

## 2023-04-27 DIAGNOSIS — M79661 Pain in right lower leg: Secondary | ICD-10-CM | POA: Diagnosis not present

## 2023-04-27 DIAGNOSIS — Z7902 Long term (current) use of antithrombotics/antiplatelets: Secondary | ICD-10-CM | POA: Diagnosis not present

## 2023-04-27 DIAGNOSIS — Z7982 Long term (current) use of aspirin: Secondary | ICD-10-CM | POA: Insufficient documentation

## 2023-04-27 DIAGNOSIS — M79604 Pain in right leg: Secondary | ICD-10-CM | POA: Insufficient documentation

## 2023-04-27 DIAGNOSIS — I774 Celiac artery compression syndrome: Secondary | ICD-10-CM | POA: Diagnosis not present

## 2023-04-27 DIAGNOSIS — E119 Type 2 diabetes mellitus without complications: Secondary | ICD-10-CM | POA: Insufficient documentation

## 2023-04-27 DIAGNOSIS — Z79899 Other long term (current) drug therapy: Secondary | ICD-10-CM | POA: Insufficient documentation

## 2023-04-27 DIAGNOSIS — I70201 Unspecified atherosclerosis of native arteries of extremities, right leg: Secondary | ICD-10-CM | POA: Diagnosis not present

## 2023-04-27 DIAGNOSIS — Z794 Long term (current) use of insulin: Secondary | ICD-10-CM | POA: Insufficient documentation

## 2023-04-27 DIAGNOSIS — Z7984 Long term (current) use of oral hypoglycemic drugs: Secondary | ICD-10-CM | POA: Diagnosis not present

## 2023-04-27 DIAGNOSIS — I10 Essential (primary) hypertension: Secondary | ICD-10-CM | POA: Diagnosis not present

## 2023-04-27 LAB — BASIC METABOLIC PANEL
Anion gap: 9 (ref 5–15)
BUN: 11 mg/dL (ref 8–23)
CO2: 24 mmol/L (ref 22–32)
Calcium: 9.2 mg/dL (ref 8.9–10.3)
Chloride: 109 mmol/L (ref 98–111)
Creatinine, Ser: 0.8 mg/dL (ref 0.44–1.00)
GFR, Estimated: >60 mL/min (ref >60)
Glucose, Bld: 53 mg/dL — ABNORMAL LOW (ref 70–99)
Potassium: 3.5 mmol/L (ref 3.5–5.1)
Sodium: 142 mmol/L (ref 135–145)

## 2023-04-27 LAB — I-STAT CHEM 8, ED
BUN: 11 mg/dL (ref 8–23)
Calcium, Ion: 1.25 mmol/L (ref 1.15–1.40)
Chloride: 108 mmol/L (ref 98–111)
Creatinine, Ser: 0.8 mg/dL (ref 0.44–1.00)
Glucose, Bld: 50 mg/dL — ABNORMAL LOW (ref 70–99)
HCT: 35 % — ABNORMAL LOW (ref 36.0–46.0)
Hemoglobin: 11.9 g/dL — ABNORMAL LOW (ref 12.0–15.0)
Potassium: 3.6 mmol/L (ref 3.5–5.1)
Sodium: 145 mmol/L (ref 135–145)
TCO2: 26 mmol/L (ref 22–32)

## 2023-04-27 LAB — CBC
HCT: 37 % (ref 36.0–46.0)
Hemoglobin: 11.8 g/dL — ABNORMAL LOW (ref 12.0–15.0)
MCH: 30.7 pg (ref 26.0–34.0)
MCHC: 31.9 g/dL (ref 30.0–36.0)
MCV: 96.4 fL (ref 80.0–100.0)
Platelets: 334 10*3/uL (ref 150–400)
RBC: 3.84 MIL/uL — ABNORMAL LOW (ref 3.87–5.11)
RDW: 12.9 % (ref 11.5–15.5)
WBC: 6 10*3/uL (ref 4.0–10.5)
nRBC: 0 % (ref 0.0–0.2)

## 2023-04-27 LAB — CBG MONITORING, ED
Glucose-Capillary: 55 mg/dL — ABNORMAL LOW (ref 70–99)
Glucose-Capillary: 102 mg/dL — ABNORMAL HIGH (ref 70–99)

## 2023-04-27 MED ORDER — OXYCODONE-ACETAMINOPHEN 5-325 MG PO TABS: 1.0000 | ORAL_TABLET | Freq: Four times a day (QID) | ORAL | 0 refills | Status: DC | PRN

## 2023-04-27 MED ORDER — DEXTROSE 50 % IV SOLN: 1.0000 | Freq: Once | INTRAVENOUS | Status: DC

## 2023-04-27 MED ORDER — IOHEXOL 350 MG/ML SOLN
100.0000 mL | Freq: Once | INTRAVENOUS | Status: AC | PRN
  Administered 2023-04-27: 100 mL via INTRAVENOUS

## 2023-04-27 MED ORDER — FENTANYL CITRATE PF 50 MCG/ML IJ SOSY
50.0000 ug | PREFILLED_SYRINGE | Freq: Once | INTRAMUSCULAR | Status: AC
  Administered 2023-04-27: 50 ug via INTRAVENOUS
  Filled 2023-04-27: qty 1

## 2023-04-27 NOTE — ED Notes (Signed)
Vascular MD at bedside.

## 2023-04-27 NOTE — ED Notes (Signed)
EDP notified of blood glucose (55). Orange juice given.

## 2023-04-27 NOTE — Discharge Instructions (Addendum)
I recommend close follow-up with PCP and vascular surgery for reevaluation.  Please do not hesitate to return to emergency department if worrisome signs symptoms we discussed become apparent.

## 2023-04-27 NOTE — ED Notes (Signed)
ED PA Meghan to bedside to see pt. Charge RN made aware for pt to be moved to next room to be seen by Dr. Sherral Hammers.

## 2023-04-27 NOTE — Telephone Encounter (Signed)
Patient called c/o "severe right leg pain".  She explains that yesterday pain was bad, however last night and today it has been worse.  States that she is unable to put right foot on the floor due to the "severe pain". I spoke with Joaquin Bend  PA he recommends  patient to be taken to [facility] as soon as possible. He is notifying Dr Karin Lieu, the on call physician. Patient voiced understanding of the instructions

## 2023-04-27 NOTE — Consult Note (Addendum)
Hospital Consult    Reason for Consult: Right leg pain with history of recent intervention Requesting Physician: ED MRN #:  [id number]  History of Present Illness: This is a 77 y.o. female who recent underwent left lower extremity angiogram complicated by access injury resulting in left-sided external iliac artery stenting.  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   Braden presented to the ED with continued right hip and thigh pain.  The right hip and thigh pain has present for quite some time, and predated the angiogram, however the angiogram did not help her pain. She is unsure as to the etiology, but came to the ED to ensure that there were no problems with her stents and ensure she did not need further blood flow surgery to fix it.   Rhya described the pain as burning in the hip and thigh. She stated it is painful to walk. Denies bleeding episodes or groin swelling.  Past Medical History:  Diagnosis Date   Bronchitis    Bulging disc    in neck   Depression with anxiety    Diabetes mellitus    Type II   Fibromyalgia    pt. denies   GERD (gastroesophageal reflux disease)    Glaucoma    History of bronchitis    Hyperlipidemia    Hypertension    Overactive bladder    Peripheral vascular disease (HCC)    Pneumonia    Sleep apnea    recent test negative for sleep apnea   Tobacco abuse    Urinary frequency     Past Surgical History:  Procedure Laterality Date   ABDOMINAL AORTAGRAM N/A 08/15/2013   Procedure: ABDOMINAL Ronny Flurry;  Surgeon: Sherren Kerns, MD;  Location: [facility] CATH LAB;  Service: Cardiovascular;  Laterality: N/A;   ABDOMINAL AORTAGRAM N/A 06/19/2014   Procedure: ABDOMINAL Ronny Flurry;  Surgeon: Sherren Kerns, MD;  Location: [facility] CATH LAB;  Service: Cardiovascular;  Laterality: N/A;   ABDOMINAL AORTOGRAM W/LOWER EXTREMITY N/A 05/10/2018   Procedure:  ABDOMINAL AORTOGRAM W/LOWER EXTREMITY;  Surgeon: Sherren Kerns, MD;  Location: [facility] INVASIVE CV LAB;  Service: Cardiovascular;  Laterality: N/A;  bilateral   ABDOMINAL AORTOGRAM W/LOWER EXTREMITY N/A 04/20/2023   Procedure: ABDOMINAL AORTOGRAM W/LOWER EXTREMITY;  Surgeon: Leonie Douglas, MD;  Location: [facility] INVASIVE CV LAB;  Service: Cardiovascular;  Laterality: N/A;   Aortogram w/ PTA  05/14/08,  11-04-10   Bilateral aortogram w/ bilateral  SFA PTA  stenting    BREAST SURGERY Right    boil removal   BUNIONECTOMY     L foot in the 1980s   COLONOSCOPY     2014   DILATION AND CURETTAGE OF UTERUS     X4   ENDARTERECTOMY FEMORAL Right 06/03/2018   Procedure: RIGHT FEMORAL ENDARTERECTOMY;  Surgeon: Sherren Kerns, MD;  Location: [facility] OR;  Service: Vascular;  Laterality: Right;   Epidural shots in neck      ESOPHAGOGASTRODUODENOSCOPY (EGD) WITH PROPOFOL N/A 09/15/2019   Procedure: ESOPHAGOGASTRODUODENOSCOPY (EGD) WITH PROPOFOL;  Surgeon: Willis Modena, MD;  Location: [facility] ENDOSCOPY;  Service: Endoscopy;  Laterality: N/A;   EUS N/A 09/15/2019   Procedure: UPPER ENDOSCOPIC ULTRASOUND (EUS) LINEAR;  Surgeon: Willis Modena, MD;  Location: [facility] ENDOSCOPY;  Service: Endoscopy;  Laterality: N/A;   EYE SURGERY Bilateral 2016   cataract removal   FEMORAL-POPLITEAL BYPASS GRAFT Left 10/21/2013   Procedure: LEFT FEMORAL-POPLITEAL  ARTERY BYPASS WITH SAPHENOUS VEIN GRAFT , POPLITEAL ENDARTERECTOMY ,INTRAOPERATIVE ARTERIOGRAM, vein patch angioplasty to popliteal artery;  Surgeon: Sherren Kerns, MD;  Location: [facility] OR;  Service: Vascular;  Laterality: Left;   FEMORAL-POPLITEAL BYPASS GRAFT Right 02/08/2016   Procedure: Right FEMORAL- to Above Knee POPLITEAL ARTERY Bypass Graft with reversed saphenous vein and Common Femoral Endarterectomy  with profundoplasty;  Surgeon: Sherren Kerns, MD;  Location: [facility] OR;  Service: Vascular;  Laterality: Right;   FINE NEEDLE ASPIRATION N/A 09/15/2019   Procedure: FINE  NEEDLE ASPIRATION (FNA) LINEAR;  Surgeon: Willis Modena, MD;  Location: [facility] ENDOSCOPY;  Service: Endoscopy;  Laterality: N/A;   GROIN DEBRIDEMENT Left 10/28/2013   Procedure: left inner thigh DEBRIDEMENT;  Surgeon: Sherren Kerns, MD;  Location: [facility] OR;  Service: Vascular;  Laterality: Left;   HAND SURGERY Left 2010   LOWER EXTREMITY ANGIOGRAM Bilateral 12/24/2015   Procedure: Lower Extremity Angiogram;  Surgeon: Sherren Kerns, MD;  Location: [facility] INVASIVE CV LAB;  Service: Cardiovascular;  Laterality: Bilateral;   PATCH ANGIOPLASTY Right 06/03/2018   Procedure: PATCH ANGIOPLASTY USING A XENOSURE 1CM X 14CM BIOLOGIC PATCH;  Surgeon: Sherren Kerns, MD;  Location: [facility] OR;  Service: Vascular;  Laterality: Right;   PERIPHERAL VASCULAR CATHETERIZATION N/A 12/24/2015   Procedure: Abdominal Aortogram;  Surgeon: Sherren Kerns, MD;  Location: [facility] INVASIVE CV LAB;  Service: Cardiovascular;  Laterality: N/A;   PERIPHERAL VASCULAR CATHETERIZATION Left 12/24/2015   Procedure: Peripheral Vascular Balloon Angioplasty;  Surgeon: Sherren Kerns, MD;  Location: [facility] INVASIVE CV LAB;  Service: Cardiovascular;  Laterality: Left;  drug coated balloon   PERIPHERAL VASCULAR CATHETERIZATION N/A 06/30/2016   Procedure: Abdominal Aortogram;  Surgeon: Sherren Kerns, MD;  Location: [facility] INVASIVE CV LAB;  Service: Cardiovascular;  Laterality: N/A;   PERIPHERAL VASCULAR CATHETERIZATION Bilateral 06/30/2016   Procedure: Lower Extremity Angiography;  Surgeon: Sherren Kerns, MD;  Location: [facility] INVASIVE CV LAB;  Service: Cardiovascular;  Laterality: Bilateral;   TONSILLECTOMY      Allergies  Allergen Reactions   Wound Dressing Adhesive Other (See Comments) and Rash    Pt states that tape and electrodes leave red "scars" on her skin and her skin is very sensitive.  Paper tape ok to use     Prior to Admission medications   Medication Sig Start Date End Date Taking? Authorizing Provider  oxyCODONE-acetaminophen  (PERCOCET/ROXICET) 5-325 MG tablet Take 1 tablet by mouth every 6 (six) hours as needed for severe pain. 04/27/23  Yes Jeanelle Malling, PA  amLODipine (NORVASC) 5 MG tablet Take 5 mg by mouth daily.    [provider]  aspirin EC 81 MG tablet Take 81 mg by mouth at bedtime.    [provider]  atorvastatin (LIPITOR) 80 MG tablet Take 1 tablet (80 mg total) by mouth daily. 04/21/23 04/20/24  Baglia, Corrina, PA-C  clopidogrel (PLAVIX) 75 MG tablet Take 1 tablet (75 mg total) by mouth daily with breakfast. 04/22/23   Baglia, Corrina, PA-C  Continuous Blood Gluc Sensor (FREESTYLE LIBRE 2 SENSOR) MISC 1 Device by Does not apply route every 14 (fourteen) days. 10/02/22   Carlus Pavlov, MD  diclofenac Sodium (VOLTAREN) 1 % GEL Apply 1 application. topically daily as needed (leg pain). 01/05/22   [provider]  diphenhydrAMINE (BENADRYL) 25 MG tablet Take 25 mg by mouth daily as needed for allergies.    [provider]  diphenhydramine-acetaminophen (TYLENOL PM) 25-500 MG TABS tablet Take 1 tablet by mouth at bedtime  as needed (sleep).    [provider]  DULoxetine (CYMBALTA) 60 MG capsule Take 60 mg by mouth daily. 04/19/22   [provider]  ferrous sulfate 325 (65 FE) MG EC tablet Take 325 mg by mouth daily.    [provider]  gabapentin (NEURONTIN) 300 MG capsule Take 600 mg by mouth 3 (three) times daily.    [provider]  ibuprofen (ADVIL) 200 MG tablet Take 200 mg by mouth every 6 (six) hours as needed for moderate pain.    [provider]  insulin aspart (NOVOLOG FLEXPEN) 100 UNIT/ML FlexPen Inject 3-6 Units into the skin 3 (three) times daily with meals. 04/26/23   Carlus Pavlov, MD  Insulin Pen Needle 32G X 4 MM MISC Use 2x a day 04/20/22   Carlus Pavlov, MD  latanoprost (XALATAN) 0.005 % ophthalmic solution Place 1 drop into both eyes at bedtime. 04/16/22   [provider]  lipase/protease/amylase 24000-76000  units CPEP Take 1 capsule (24,000 Units total) by mouth 3 (three) times daily before meals. Patient taking differently: Take 24,000-48,000 Units by mouth See admin instructions. Take 2 tablet before meals and 1 tablet after meals and 1 tablet with a snack 01/31/22   Burnadette Pop, MD  Magnesium 250 MG TABS Take 250 mg by mouth daily.    [provider]  Menthol-Methyl Salicylate (SALONPAS PAIN RELIEF PATCH) PTCH Apply 1 patch topically daily as needed (pain).    [provider]  metFORMIN (GLUCOPHAGE-XR) 500 MG 24 hr tablet Take 1 tablet (500 mg total) by mouth 2 (two) times daily with a meal. Patient taking differently: Take 1,000 mg by mouth 2 (two) times daily with a meal. 12/18/22   Carlus Pavlov, MD  metoprolol succinate (TOPROL-XL) 100 MG 24 hr tablet Take 100 mg by mouth daily. Take with or immediately following a meal.    [provider]  Multiple Vitamins-Minerals (CENTRUM SILVER 50+WOMEN) TABS Take 1 tablet by mouth daily.    [provider]  OVER THE COUNTER MEDICATION Take 1 tablet by mouth daily. Brain health supplement    [provider]  oxybutynin (DITROPAN) 5 MG tablet Take 5 mg by mouth 2 (two) times daily.    [provider]  pantoprazole (PROTONIX) 40 MG tablet Take 40 mg by mouth 2 (two) times daily. 10/25/15   [provider]  polyethylene glycol (MIRALAX / GLYCOLAX) 17 g packet Take 17 g by mouth daily as needed. Patient not taking: Reported on 04/19/2023 01/31/22   Burnadette Pop, MD  temazepam (RESTORIL) 30 MG capsule Take 30 mg by mouth at bedtime as needed for sleep. 04/15/18   [provider]  timolol (TIMOPTIC) 0.5 % ophthalmic solution Place 1 drop into both eyes every morning. 12/20/21   [provider]  traMADol (ULTRAM) 50 MG tablet Take 50 mg by mouth every 8 (eight) hours as needed for moderate pain.    [provider]  traZODone (DESYREL) 50 MG tablet Take 75 mg by mouth at  bedtime as needed for sleep.    [provider]    Social History   Socioeconomic History   Marital status: Single    Spouse name: Not on file   Number of children: 1   Years of education: Not on file   Highest education level: Not on file  Occupational History   Occupation: Retired    Associate Professor: Scientist, water quality  Tobacco Use   Smoking status: Former    Packs/day: .1  Types: Cigarettes    Quit date: 10/2014    Years since quitting: 8.5    Passive exposure: Never   Smokeless tobacco: Never   Tobacco comments:    pt states that she is using the E cig only   Vaping Use   Vaping Use: Some days  Substance and Sexual Activity   Alcohol use: Yes    Alcohol/week: 0.0 - 1.0 standard drinks of alcohol    Comment: Occasional; 10-22 rarely    Drug use: No   Sexual activity: Not on file  Other Topics Concern   Not on file  Social History Narrative   Drinks about 2 cups of coffee a day, and some tea    Social Determinants of Health   Financial Resource Strain: Not on file  Food Insecurity: No Food Insecurity (04/20/2023)   Hunger Vital Sign    Worried About Running Out of Food in the Last Year: Never true    Ran Out of Food in the Last Year: Never true  Transportation Needs: No Transportation Needs (04/20/2023)   PRAPARE - Administrator, Civil Service (Medical): No    Lack of Transportation (Non-Medical): No  Physical Activity: Not on file  Stress: Not on file  Social Connections: Not on file  Intimate Partner Violence: Not At Risk (04/20/2023)   Humiliation, Afraid, Rape, and Kick questionnaire    Fear of Current or Ex-Partner: No    Emotionally Abused: No    Physically Abused: No    Sexually Abused: No   Family History  Problem Relation Age of Onset   Heart disease Father    Heart attack Father        MI at age 41   Hypertension Mother    Alzheimer's disease Mother    Hypertension Sister    Diabetes Brother    Hyperlipidemia Brother    Hypertension  Brother    Heart disease Brother    Heart attack Brother    Breast cancer Maternal Aunt     ROS: Otherwise negative unless mentioned in HPI  Physical Examination  Vitals:   04/27/23 1537 04/27/23 2046  BP: 130/83 (!) 155/76  Pulse: 68 67  Resp: 15 18  Temp: 98.2 F (36.8 C) (!) 97.4 F (36.3 C)  SpO2: 97% 98%   Body mass index is 21.26 kg/m.  General:  WDWN in NAD Gait: Not observed HENT: WNL, normocephalic Pulmonary: normal non-labored breathing, without Rales, rhonchi,  wheezing Cardiac: regula Abdomen:  soft, NT/ND, no masses Skin: without rashes Vascular Exam/Pulses: 2+ DP on the right Right thigh soft.   Extremities: without ischemic changes, without Gangrene , without cellulitis; without open wounds;  Musculoskeletal: no muscle wasting or atrophy  Neurologic: A&O X 3;  No focal weakness or paresthesias are detected; speech is fluent/normal Psychiatric:  The pt has Normal affect. Lymph:  Unremarkable  CBC    Component Value Date/Time   WBC 6.0 04/27/2023 1556   RBC 3.84 (L) 04/27/2023 1556   HGB 11.9 (L) 04/27/2023 1614   HCT 35.0 (L) 04/27/2023 1614   PLT 334 04/27/2023 1556   MCV 96.4 04/27/2023 1556   MCH 30.7 04/27/2023 1556   MCHC 31.9 04/27/2023 1556   RDW 12.9 04/27/2023 1556   LYMPHSABS 2.0 01/31/2022 0105   MONOABS 0.3 01/31/2022 0105   EOSABS 0.3 01/31/2022 0105   BASOSABS 0.0 01/31/2022 0105    BMET    Component Value Date/Time   NA 145 04/27/2023 1614   K  3.6 04/27/2023 1614   CL 108 04/27/2023 1614   CO2 24 04/27/2023 1556   GLUCOSE 50 (L) 04/27/2023 1614   BUN 11 04/27/2023 1614   CREATININE 0.80 04/27/2023 1614   CALCIUM 9.2 04/27/2023 1556   GFRNONAA >60 04/27/2023 1556   GFRAA >60 09/04/2019 0249    COAGS: Lab Results  Component Value Date   INR 0.9 04/20/2023   INR 0.93 05/17/2018   INR 0.97 02/03/2016       ASSESSMENT/PLAN: This is a 77 y.o. female who presented to the emergency department with right leg pain  with history of previous lower extremity angiography and stenting.  Imaging was reviewed demonstrating dissection within the common femoral artery, which was present at completion angiography.  CT notes filling of both the superficial femoral artery and profunda. The thrombus is limited to the dissection. There is a palpable pulse in the foot. The external iliac stent is patent.    With filling of the common femoral, SFA, profunda, I do not have an etiology for the thigh pain, especailly as it was present prior to the angiogram. I will have her follow up with Dr. Lenell Antu next week with new duplex imaging studies to assess progression v regression of her pain.    Fara Olden MD [facility] [phone] 04/27/2023  9:05 PM

## 2023-04-27 NOTE — ED Triage Notes (Addendum)
Pt here POV for R leg pain where stent was placed, states that she had a stent placed on Friday. Sent here by vascular for evaluation. Denies CP/ShOB/dizziness. No hematoma present at insert site. Femoral and pedal pulses audible with doppler.

## 2023-04-27 NOTE — ED Provider Triage Note (Addendum)
Emergency Medicine Provider Triage Evaluation Note  Jill Higgins , a 77 y.o. female  was evaluated in triage.  Pt complains of right leg pain where stent was placed this past Friday by Dr. Heath Lark.  She was sent by vascular for evaluation after she called their office about her pain.  She reports that her pain is unbearable when she moves her leg or attempts to walk.  Denies chest pain, shortness of breath, overlying skin changes, fever.   Review of Systems  Positive: As above Negative: As above  Physical Exam  BP 130/83 (BP Location: Right Arm)   Pulse 68   Temp 98.2 F (36.8 C) (Oral)   Resp 15   Ht 5\' 3"  (1.6 m)   Wt 54.4 kg   SpO2 97%   BMI 21.26 kg/m  Gen:   Awake, no distress   Resp:  Normal effort  MSK:   Moves extremities without difficulty  Other:  Femoral and pedal pulses audible with Doppler.  Skin is warm, dry.  No discoloration.  No hematoma at the insertion site.   Medical Decision Making  Medically screening exam initiated at 4:04 PM.  Appropriate orders placed.  [name] was informed that the remainder of the evaluation will be completed by another provider, this initial triage assessment does not replace that evaluation, and the importance of remaining in the ED until their evaluation is complete.  CT angio aortobifemoral w/runoff ordered per Dr. Karin Lieu request.  Dr. Sherral Hammers to come see patient while in ED (on-call physician).    Melton Alar R, PA-C 04/27/23 1608    Melton Alar R, PA-C 04/27/23 [phone]

## 2023-04-28 NOTE — ED Provider Notes (Signed)
[place] EMERGENCY DEPARTMENT AT [facility] Provider Note   CSN: [id number] Arrival date & time: 04/27/23  1517     History  Chief Complaint  Patient presents with   Leg Pain    Jill Higgins is a 77 y.o. female with a past medical history of diabetes, hypertension, s/p femoral-popliteal bypass surgery presents today for evaluation of right leg pain.  Patient reports right leg pain with stents were placed this past Friday by Dr. Lenell Antu.  She was sent by vascular for evaluation after she called the office about her pain.  She reports that the pain is unbearable when she moves her leg or attempts to walk.  Denies chest pain, shortness of breath, overlying skin changes, fever.   Leg Pain     Past Medical History:  Diagnosis Date   Bronchitis    Bulging disc    in neck   Depression with anxiety    Diabetes mellitus    Type II   Fibromyalgia    pt. denies   GERD (gastroesophageal reflux disease)    Glaucoma    History of bronchitis    Hyperlipidemia    Hypertension    Overactive bladder    Peripheral vascular disease (HCC)    Pneumonia    Sleep apnea    recent test negative for sleep apnea   Tobacco abuse    Urinary frequency    Past Surgical History:  Procedure Laterality Date   ABDOMINAL AORTAGRAM N/A 08/15/2013   Procedure: ABDOMINAL Ronny Flurry;  Surgeon: Sherren Kerns, MD;  Location: [facility] CATH LAB;  Service: Cardiovascular;  Laterality: N/A;   ABDOMINAL AORTAGRAM N/A 06/19/2014   Procedure: ABDOMINAL Ronny Flurry;  Surgeon: Sherren Kerns, MD;  Location: [facility] CATH LAB;  Service: Cardiovascular;  Laterality: N/A;   ABDOMINAL AORTOGRAM W/LOWER EXTREMITY N/A 05/10/2018   Procedure: ABDOMINAL AORTOGRAM W/LOWER EXTREMITY;  Surgeon: Sherren Kerns, MD;  Location: [facility] INVASIVE CV LAB;  Service: Cardiovascular;  Laterality: N/A;  bilateral   ABDOMINAL AORTOGRAM W/LOWER EXTREMITY N/A 04/20/2023   Procedure: ABDOMINAL AORTOGRAM W/LOWER EXTREMITY;  Surgeon: Leonie Douglas, MD;  Location: [facility] INVASIVE CV LAB;  Service: Cardiovascular;  Laterality: N/A;   Aortogram w/ PTA  05/14/08,  11-04-10   Bilateral aortogram w/ bilateral  SFA PTA  stenting    BREAST SURGERY Right    boil removal   BUNIONECTOMY     L foot in the 1980s   COLONOSCOPY     2014   DILATION AND CURETTAGE OF UTERUS     X4   ENDARTERECTOMY FEMORAL Right 06/03/2018   Procedure: RIGHT FEMORAL ENDARTERECTOMY;  Surgeon: Sherren Kerns, MD;  Location: [facility] OR;  Service: Vascular;  Laterality: Right;   Epidural shots in neck      ESOPHAGOGASTRODUODENOSCOPY (EGD) WITH PROPOFOL N/A 09/15/2019   Procedure: ESOPHAGOGASTRODUODENOSCOPY (EGD) WITH PROPOFOL;  Surgeon: Willis Modena, MD;  Location: [facility] ENDOSCOPY;  Service: Endoscopy;  Laterality: N/A;   EUS N/A 09/15/2019   Procedure: UPPER ENDOSCOPIC ULTRASOUND (EUS) LINEAR;  Surgeon: Willis Modena, MD;  Location: [facility] ENDOSCOPY;  Service: Endoscopy;  Laterality: N/A;   EYE SURGERY Bilateral 2016   cataract removal   FEMORAL-POPLITEAL BYPASS GRAFT Left 10/21/2013   Procedure: LEFT FEMORAL-POPLITEAL ARTERY BYPASS WITH SAPHENOUS VEIN GRAFT , POPLITEAL ENDARTERECTOMY ,INTRAOPERATIVE ARTERIOGRAM, vein patch angioplasty to popliteal artery;  Surgeon: Sherren Kerns, MD;  Location: [facility] OR;  Service: Vascular;  Laterality: Left;   FEMORAL-POPLITEAL BYPASS GRAFT Right 02/08/2016   Procedure: Right  FEMORAL- to Above Knee POPLITEAL ARTERY Bypass Graft with reversed saphenous vein and Common Femoral Endarterectomy  with profundoplasty;  Surgeon: Sherren Kerns, MD;  Location: [facility] OR;  Service: Vascular;  Laterality: Right;   FINE NEEDLE ASPIRATION N/A 09/15/2019   Procedure: FINE NEEDLE ASPIRATION (FNA) LINEAR;  Surgeon: Willis Modena, MD;  Location: [facility] ENDOSCOPY;  Service: Endoscopy;  Laterality: N/A;   GROIN DEBRIDEMENT Left 10/28/2013   Procedure: left inner thigh DEBRIDEMENT;  Surgeon: Sherren Kerns, MD;  Location: [facility] OR;  Service: Vascular;   Laterality: Left;   HAND SURGERY Left 2010   LOWER EXTREMITY ANGIOGRAM Bilateral 12/24/2015   Procedure: Lower Extremity Angiogram;  Surgeon: Sherren Kerns, MD;  Location: [facility] INVASIVE CV LAB;  Service: Cardiovascular;  Laterality: Bilateral;   PATCH ANGIOPLASTY Right 06/03/2018   Procedure: PATCH ANGIOPLASTY USING A XENOSURE 1CM X 14CM BIOLOGIC PATCH;  Surgeon: Sherren Kerns, MD;  Location: [facility] OR;  Service: Vascular;  Laterality: Right;   PERIPHERAL VASCULAR CATHETERIZATION N/A 12/24/2015   Procedure: Abdominal Aortogram;  Surgeon: Sherren Kerns, MD;  Location: [facility] INVASIVE CV LAB;  Service: Cardiovascular;  Laterality: N/A;   PERIPHERAL VASCULAR CATHETERIZATION Left 12/24/2015   Procedure: Peripheral Vascular Balloon Angioplasty;  Surgeon: Sherren Kerns, MD;  Location: [facility] INVASIVE CV LAB;  Service: Cardiovascular;  Laterality: Left;  drug coated balloon   PERIPHERAL VASCULAR CATHETERIZATION N/A 06/30/2016   Procedure: Abdominal Aortogram;  Surgeon: Sherren Kerns, MD;  Location: [facility] INVASIVE CV LAB;  Service: Cardiovascular;  Laterality: N/A;   PERIPHERAL VASCULAR CATHETERIZATION Bilateral 06/30/2016   Procedure: Lower Extremity Angiography;  Surgeon: Sherren Kerns, MD;  Location: [facility] INVASIVE CV LAB;  Service: Cardiovascular;  Laterality: Bilateral;   TONSILLECTOMY       Home Medications Prior to Admission medications   Medication Sig Start Date End Date Taking? Authorizing Provider  oxyCODONE-acetaminophen (PERCOCET/ROXICET) 5-325 MG tablet Take 1 tablet by mouth every 6 (six) hours as needed for severe pain. 04/27/23  Yes Jeanelle Malling, PA  amLODipine (NORVASC) 5 MG tablet Take 5 mg by mouth daily.    [provider]  aspirin EC 81 MG tablet Take 81 mg by mouth at bedtime.    [provider]  atorvastatin (LIPITOR) 80 MG tablet Take 1 tablet (80 mg total) by mouth daily. 04/21/23 04/20/24  Baglia, Corrina, PA-C  clopidogrel (PLAVIX) 75 MG tablet Take 1 tablet (75 mg total) by  mouth daily with breakfast. 04/22/23   Baglia, Corrina, PA-C  Continuous Blood Gluc Sensor (FREESTYLE LIBRE 2 SENSOR) MISC 1 Device by Does not apply route every 14 (fourteen) days. 10/02/22   Carlus Pavlov, MD  diclofenac Sodium (VOLTAREN) 1 % GEL Apply 1 application. topically daily as needed (leg pain). 01/05/22   [provider]  diphenhydrAMINE (BENADRYL) 25 MG tablet Take 25 mg by mouth daily as needed for allergies.    [provider]  diphenhydramine-acetaminophen (TYLENOL PM) 25-500 MG TABS tablet Take 1 tablet by mouth at bedtime as needed (sleep).    [provider]  DULoxetine (CYMBALTA) 60 MG capsule Take 60 mg by mouth daily. 04/19/22   [provider]  ferrous sulfate 325 (65 FE) MG EC tablet Take 325 mg by mouth daily.    [provider]  gabapentin (NEURONTIN) 300 MG capsule Take 600 mg by mouth 3 (three) times daily.    [provider]  ibuprofen (ADVIL) 200 MG tablet Take 200 mg by mouth every 6 (six) hours as needed  for moderate pain.    [provider]  insulin aspart (NOVOLOG FLEXPEN) 100 UNIT/ML FlexPen Inject 3-6 Units into the skin 3 (three) times daily with meals. 04/26/23   Carlus Pavlov, MD  Insulin Pen Needle 32G X 4 MM MISC Use 2x a day 04/20/22   Carlus Pavlov, MD  latanoprost (XALATAN) 0.005 % ophthalmic solution Place 1 drop into both eyes at bedtime. 04/16/22   [provider]  lipase/protease/amylase 24000-76000 units CPEP Take 1 capsule (24,000 Units total) by mouth 3 (three) times daily before meals. Patient taking differently: Take 24,000-48,000 Units by mouth See admin instructions. Take 2 tablet before meals and 1 tablet after meals and 1 tablet with a snack 01/31/22   Burnadette Pop, MD  Magnesium 250 MG TABS Take 250 mg by mouth daily.    [provider]  Menthol-Methyl Salicylate (SALONPAS PAIN RELIEF PATCH) PTCH Apply 1 patch topically daily as needed (pain).    [provider]  metFORMIN (GLUCOPHAGE-XR) 500 MG 24 hr tablet Take 1 tablet (500 mg total) by mouth 2 (two) times daily with a meal. Patient taking differently: Take 1,000 mg by mouth 2 (two) times daily with a meal. 12/18/22   Carlus Pavlov, MD  metoprolol succinate (TOPROL-XL) 100 MG 24 hr tablet Take 100 mg by mouth daily. Take with or immediately following a meal.    [provider]  Multiple Vitamins-Minerals (CENTRUM SILVER 50+WOMEN) TABS Take 1 tablet by mouth daily.    [provider]  OVER THE COUNTER MEDICATION Take 1 tablet by mouth daily. Brain health supplement    [provider]  oxybutynin (DITROPAN) 5 MG tablet Take 5 mg by mouth 2 (two) times daily.    [provider]  pantoprazole (PROTONIX) 40 MG tablet Take 40 mg by mouth 2 (two) times daily. 10/25/15   [provider]  polyethylene glycol (MIRALAX / GLYCOLAX) 17 g packet Take 17 g by mouth daily as needed. Patient not taking: Reported on 04/19/2023 01/31/22   Burnadette Pop, MD  temazepam (RESTORIL) 30 MG capsule Take 30 mg by mouth at bedtime as needed for sleep. 04/15/18   [provider]  timolol (TIMOPTIC) 0.5 % ophthalmic solution Place 1 drop into both eyes every morning. 12/20/21   [provider]  traMADol (ULTRAM) 50 MG tablet Take 50 mg by mouth every 8 (eight) hours as needed for moderate pain.    [provider]  traZODone (DESYREL) 50 MG tablet Take 75 mg by mouth at bedtime as needed for sleep.    [provider]      Allergies    Wound dressing adhesive    Review of Systems   Review of Systems Negative except as per HPI.  Physical Exam Updated Vital Signs BP (!) 155/76 (BP Location: Right Arm)   Pulse 67   Temp (!) 97.4 F (36.3 C) (Oral)   Resp 18   Ht 5\' 3"  (1.6 m)   Wt 54.4 kg   SpO2 98%   BMI 21.26 kg/m  Physical Exam Vitals and nursing note reviewed.  Constitutional:      Appearance: Normal appearance.  HENT:      Head: Normocephalic and atraumatic.     Mouth/Throat:     Mouth: Mucous membranes are moist.  Eyes:     General: No scleral icterus. Cardiovascular:     Rate and Rhythm: Normal rate and regular rhythm.     Pulses: Normal pulses.     Heart sounds:  Normal heart sounds.  Pulmonary:     Effort: Pulmonary effort is normal.     Breath sounds: Normal breath sounds.  Abdominal:     General: Abdomen is flat.     Palpations: Abdomen is soft.     Tenderness: There is no abdominal tenderness.  Musculoskeletal:        General: No deformity.     Comments: Femoral and pedal pulses audible with Doppler.  Skin is warm, dry.  No discoloration.  No hematoma at the insertion site.  Skin:    General: Skin is warm.     Findings: No rash.  Neurological:     General: No focal deficit present.     Mental Status: She is alert.  Psychiatric:        Mood and Affect: Mood normal.     ED Results / Procedures / Treatments   Labs (all labs ordered are listed, but only abnormal results are displayed) Labs Reviewed  BASIC METABOLIC PANEL - Abnormal; Notable for the following components:      Result Value   Glucose, Bld 53 (*)    All other components within normal limits  CBC - Abnormal; Notable for the following components:   RBC 3.84 (*)    Hemoglobin 11.8 (*)    All other components within normal limits  I-STAT CHEM 8, ED - Abnormal; Notable for the following components:   Glucose, Bld 50 (*)    Hemoglobin 11.9 (*)    HCT 35.0 (*)    All other components within normal limits  CBG MONITORING, ED - Abnormal; Notable for the following components:   Glucose-Capillary 55 (*)    All other components within normal limits  CBG MONITORING, ED - Abnormal; Notable for the following components:   Glucose-Capillary 102 (*)    All other components within normal limits    EKG None  Radiology CT Angio Aortobifemoral W and/or Wo Contrast  Result Date: 04/27/2023 CLINICAL DATA:  Claudication or leg  ischemia EXAM: CT ANGIOGRAPHY OF ABDOMINAL AORTA WITH ILIOFEMORAL RUNOFF TECHNIQUE: Multidetector CT imaging of the abdomen, pelvis and lower extremities was performed using the standard protocol during bolus administration of intravenous contrast. Multiplanar CT image reconstructions and MIPs were obtained to evaluate the vascular anatomy. RADIATION DOSE REDUCTION: This exam was performed according to the departmental dose-optimization program which includes automated exposure control, adjustment of the mA and/or kV according to patient size and/or use of iterative reconstruction technique. CONTRAST:  OMNIPAQUE IOHEXOL 350 MG/ML SOLN COMPARISON:  None Available. FINDINGS: VASCULAR Normal contour and caliber of the abdominal aorta. No evidence of aneurysm, dissection, or other acute aortic pathology. Standard branching pattern of the abdominal aorta, with solitary bilateral renal arteries. Moderate mixed calcific atherosclerosis throughout. Mixed calcific atherosclerosis of the branch vessel origins, with greater than 50% stenosis at the celiac axis origin as well as the superior mesenteric artery origin (series 9, image 94). Atherosclerosis of the bilateral renal artery origins without high-grade stenosis. RIGHT Lower Extremity Inflow: Patent right common iliac artery with moderate aortic atherosclerosis. Stenting of the right external iliac artery, with a focal dissection and thrombus near the landing site at the common femoral artery (series 6, image 119). The superficial femoral artery is occluded along its length, with a patent bypass graft. Outflow: Focal dissection of the right common femoral artery as above. Superficial and profunda femoral arteries are patent with minimal scattered atherosclerosis and no significant stenosis. Mixed calcific atherosclerosis of the popliteal artery, with approximately 50%  stenosis just above the knee joint (series 6, image 238). Runoff: The trifurcation vessels are  patent at their origins, with scattered atherosclerosis and two-vessel runoff to the right ankle, the peroneal artery extinguished just above the ankle. LEFT Lower Extremity Inflow: Patent left common iliac, external iliac, and common femoral arteries with moderate aortic atherosclerosis. Outflow: Bypass grafting of the left superficial femoral artery, which is occluded along its length. Profunda femoral remains patent. Runoff: Incidental note of a relatively high division of the popliteal artery. The trifurcation vessels are patent at their origins, with scattered calcific atherosclerosis, and one-vessel runoff to the left ankle via the anterior tibial artery. Veins: No obvious venous abnormality within the limitations of this arterial phase study. Review of the MIP images confirms the above findings. NON-VASCULAR Lower chest: No acute abnormality. Hepatobiliary: No solid liver abnormality is seen. No gallstones, gallbladder wall thickening, or biliary dilatation. Pancreas: Unremarkable. No pancreatic ductal dilatation or surrounding inflammatory changes. Spleen: Normal in size without significant abnormality. Adrenals/Urinary Tract: Adrenal glands are unremarkable. Kidneys are normal, without renal calculi, solid lesion, or hydronephrosis. Bladder is unremarkable. Stomach/Bowel: Stomach is within normal limits. Appendix appears normal. No evidence of bowel wall thickening, distention, or inflammatory changes. Vascular/Lymphatic: No significant vascular findings are present. No enlarged abdominal or pelvic lymph nodes. Reproductive: No mass or other significant abnormality. Other: No abdominal wall hernia or abnormality. No ascites. Musculoskeletal: Diffuse soft tissue edema of the lower extremities. IMPRESSION: 1. Stenting of the right external iliac artery, with a focal dissection and thrombus near the landing site at the common femoral artery. 2. Patent bypass graft of the right superficial femoral artery, which  is occluded along its length. 3. Mixed calcific atherosclerosis of the right popliteal artery, with approximately 50% stenosis just above the knee joint. 4. Right trifurcation vessels are patent at their origins, with scattered atherosclerosis and two-vessel runoff to the right ankle via the anterior and posterior tibial arteries. 5. Patent bypass graft of the left superficial femoral artery, which is occluded along its length. 6. Left trifurcation vessels are patent at their origins, with scattered calcific atherosclerosis, and one-vessel runoff to the left ankle via the anterior tibial artery. 7. Moderate mixed calcific atherosclerosis of the abdominal aorta. Greater than 50% stenosis at the celiac axis origin as well as the superior mesenteric artery origin. Electronically Signed   By: Jearld Lesch M.D.   On: 04/27/2023 18:01    Procedures Procedures    Medications Ordered in ED Medications  iohexol (OMNIPAQUE) 350 MG/ML injection 100 mL (100 mLs Intravenous Contrast Given 04/27/23 1720)  fentaNYL (SUBLIMAZE) injection 50 mcg (50 mcg Intravenous Given 04/27/23 1815)    ED Course/ Medical Decision Making/ A&P Clinical Course as of 04/28/23 2215  Fri Apr 27, 2023  1714 77 yo female w/ hx of iliac stenting and bypass grafting with vascular surgery here with worsening right lower leg pain, vascular surgeon in contact with ED triage staff requesting CTA, which is in process now.  Pt had pedal and femoral pulses noted per doppler.  Patient has no blanching of the skin, no discoloration of the lower extremity.  Capillary refill is roughly equal bilaterally.  She is a small amount of ecchymosis in bilateral inguinal folds, but no palpable bruit or tenderness with palpation of femoral arteries or veins.  She is pending CT imaging results and vascular evaluation.  Clinically I have a lower suspicion for an acutely ischemic limb at this time. [MT]  1814 Vasc surgery PA at  bedside. Glucose 50 on arrival,  improved only slightly with juice to 55, pt mentating well.  Holding off on food as there may be potential surgical intervention, but dextrose ordered [MT]  1817 Dr Sherral Hammers in OR, made aware through OR nurse of CT findings; PA at bedside [MT]  1932 Glucose normalized without d50 [MT]    Clinical Course User Index [MT] Trifan, Kermit Balo, MD                             Medical Decision Making Amount and/or Complexity of Data Reviewed Labs: ordered.  Risk Prescription drug management.   This patient presents to the ED for leg pain, this involves an extensive number of treatment options, and is a complaint that carries with a high risk of complications and morbidity.  The differential diagnosis includes DVT, ischemia, surgical complication, cellulitis.  This is not an exhaustive list.  Lab tests: I ordered and personally interpreted labs.  The pertinent results include: WBC unremarkable. Hbg unremarkable. Platelets unremarkable. Electrolytes unremarkable. BUN, creatinine unremarkable.   Imaging studies: I ordered imaging studies. I personally reviewed, interpreted imaging and agree with the radiologist's interpretations. The results include: CT angio aorta with bifemoral demonstrating dissection within the common femoral artery.  Problem list/ ED course/ Critical interventions/ Medical management: HPI: See above Vital signs within normal range and stable throughout visit. Laboratory/imaging studies significant for: See above. On physical examination, patient is afebrile and appears in no acute distress.  Imaging showed dissection within the common femoral artery, which was present at completion angiography.  CT notes filling of both the superficial femoral artery and profunda thrombus is limited to this dissection.  There is palpable pulse in the foot.  The external iliac stent is patent. Low suspicion for ischemia at this point. Doubt DVT. Will consult vascular surgery.  Patient was found  to have hide pleural glycemia with glucose of 50.  She was given orange juice which increased her glucose to above 100.  Patient is kept n.p.o. given fentanyl for pain.  Reevaluation of patient after this medication showed that the patient improved. I have reviewed the patient home medicines and have made adjustments as needed.  Cardiac monitoring/EKG: The patient was maintained on a cardiac monitor.  I personally reviewed and interpreted the cardiac monitor which showed an underlying rhythm of: sinus rhythm.  Additional history obtained: External records from outside source obtained and reviewed including: Chart review including previous notes, labs, imaging.  Consultations obtained: I requested consultation with Dr. Karin Lieu, and discussed lab and imaging findings as well as pertinent plan.  He recommended outpatient follow up with vascular surgery.  Disposition Continued outpatient therapy. Follow-up with vascular surgery recommended for reevaluation of symptoms. Treatment plan discussed with patient.  Pt acknowledged understanding was agreeable to the plan. Worrisome signs and symptoms were discussed with patient, and patient acknowledged understanding to return to the ED if they noticed these signs and symptoms. Patient was stable upon discharge.   This chart was dictated using voice recognition software.  Despite best efforts to proofread,  errors can occur which can change the documentation meaning.          Final Clinical Impression(s) / ED Diagnoses Final diagnoses:  Right leg pain    Rx / DC Orders ED Discharge Orders          Ordered    oxyCODONE-acetaminophen (PERCOCET/ROXICET) 5-325 MG tablet  Every 6 hours PRN  04/27/23 2031              Jeanelle Malling, PA 04/28/23 2220    Terald Sleeper, MD 04/29/23 [phone]

## 2023-05-01 ENCOUNTER — Encounter: Payer: Medicare HMO | Admitting: Vascular Surgery

## 2023-05-01 ENCOUNTER — Encounter (HOSPITAL_COMMUNITY): Payer: Medicare HMO

## 2023-05-02 NOTE — Discharge Summary (Signed)
[facility] Discharge Summary   Patient ID:  Jill Higgins MRN: [id number] DOB/AGE: 1946/09/07 77 y.o.  Admit date: 04/20/2023 Discharge date: 05/02/2023 Attending Surgeon: Lenell Antu Admission Diagnosis: Peripheral arterial disease (HCC) [I73.9]  Discharge Diagnoses:  Peripheral arterial disease (HCC) [I73.9]  Secondary Diagnoses: Past Medical History:  Diagnosis Date   Bronchitis    Bulging disc    in neck   Depression with anxiety    Diabetes mellitus    Type II   Fibromyalgia    pt. denies   GERD (gastroesophageal reflux disease)    Glaucoma    History of bronchitis    Hyperlipidemia    Hypertension    Overactive bladder    Peripheral vascular disease (HCC)    Pneumonia    Sleep apnea    recent test negative for sleep apnea   Tobacco abuse    Urinary frequency     Procedures: 04/20/2023 Procedure(s): ABDOMINAL AORTOGRAM W/LOWER EXTREMITY  Discharged Condition: good  Jill Higgins is a 77 y.o. (1946/04/20) female who presents for routine follow up chronic abdominal pain peripheral artery disease. She has remote history of  Left fem-popliteal vein bypass in December of 2014 by Dr. Darrick Penna, and right femoral to above knee popliteal vein bypass in March of 2017 by Dr. Darrick Penna. She also had to latera have a redo right femoral endarterectomy in July of 2019 by Dr. Darrick Penna due to disabling claudication.    Today she reports severe pain in her right groin that radiates around to her hip. This occurs both at rest and on ambulation. She describes it as a deep aching pain " like a tooth ache" with intermittent sharp pains that shoot down to her foot. She says this is usually worse when she tries to elevate her legs. She has bilateral swelling that has been present for several years. She says she cannot tolerate elevating recently due to her groin pain. She sleeps in a recliner but is forced to sleep almost upright due to the pain. She says her legs themselves do  not cause any pain on ambulation unless she walks a very far distance. She has no pain at rest in her feet. No non healing wounds.    She has chronic abdominal pain with chronic pancreatitis. She underwent a whipple procedure in December of 2020. At the time she had CT showing mesenteric stenosis. However very atypical symptoms for mesenteric ischemia. She was last seen in July of 2023 by Dr. Lenell Antu and was scheduled for CTA follow up. No intervention indicated as felt not to need it with celiac artery stenosis but patent IMA and SMA. She is without any significant abdominal pain, no food fear, weight loss, post prandial pain. She does get very intermittent nausea. She has irregular bowel movements with chronic diarrhea but this has been an ongoing issue. She also reports urinary incontinence. She is followed by GI for these issues.   Hospital Course:  Patient admitted for observation. No issues overnight or next day per the examining team. Patient discharged with outpatient follow up with me.   Significant Diagnostic Studies: CBC    Component Value Date/Time   WBC 6.0 04/27/2023 1556   RBC 3.84 (Jill) 04/27/2023 1556   HGB 11.9 (Jill) 04/27/2023 1614   HCT 35.0 (Jill) 04/27/2023 1614   PLT 334 04/27/2023 1556   MCV 96.4 04/27/2023 1556   MCH 30.7 04/27/2023 1556   MCHC 31.9 04/27/2023 1556   RDW 12.9 04/27/2023 1556   LYMPHSABS  2.0 01/31/2022 0105   MONOABS 0.3 01/31/2022 0105   EOSABS 0.3 01/31/2022 0105   BASOSABS 0.0 01/31/2022 0105    BMET    Component Value Date/Time   NA 145 04/27/2023 1614   K 3.6 04/27/2023 1614   CL 108 04/27/2023 1614   CO2 24 04/27/2023 1556   GLUCOSE 50 (Jill) 04/27/2023 1614   BUN 11 04/27/2023 1614   CREATININE 0.80 04/27/2023 1614   CALCIUM 9.2 04/27/2023 1556   GFRNONAA >60 04/27/2023 1556   GFRAA >60 09/04/2019 0249    COAG estimated creatinine clearance is 48.7 mL/min (by C-G formula based on SCr of 0.8 mg/dL).  No results found for:  "PTT"  Disposition:  Discharge to :Home Discharge Instructions     Call MD for:  redness, tenderness, or signs of infection (pain, swelling, bleeding, redness, odor or green/yellow discharge around incision site)   Complete by: As directed    Call MD for:  severe or increased pain, loss or decreased feeling  in affected limb(s)   Complete by: As directed    Call MD for:  temperature >100.5   Complete by: As directed    Discharge wound care:   Complete by: As directed    Bilateral groins keep dressings on for 24 hours. You can then remove and shower as normal. Pat the groins dry after showering. Do not soak in bathtub   Driving Restrictions   Complete by: As directed    No driving while taking narcotic pain medication   Increase activity slowly   Complete by: As directed    Walk with assistance use walker or cane as needed   Lifting restrictions   Complete by: As directed    No lifting for 2 weeks   Resume previous diet   Complete by: As directed        [employer]. Lenell Antu, MD [facility] Phone Number: [phone] 05/02/2023 1:56 PM

## 2023-05-07 DIAGNOSIS — I10 Essential (primary) hypertension: Secondary | ICD-10-CM | POA: Diagnosis not present

## 2023-05-07 DIAGNOSIS — F17200 Nicotine dependence, unspecified, uncomplicated: Secondary | ICD-10-CM | POA: Diagnosis not present

## 2023-05-07 DIAGNOSIS — I739 Peripheral vascular disease, unspecified: Secondary | ICD-10-CM | POA: Diagnosis not present

## 2023-05-07 DIAGNOSIS — Z09 Encounter for follow-up examination after completed treatment for conditions other than malignant neoplasm: Secondary | ICD-10-CM | POA: Diagnosis not present

## 2023-05-07 DIAGNOSIS — I7777 Dissection of artery of lower extremity: Secondary | ICD-10-CM | POA: Diagnosis not present

## 2023-05-10 ENCOUNTER — Encounter: Payer: Self-pay | Admitting: Internal Medicine

## 2023-05-10 ENCOUNTER — Other Ambulatory Visit: Payer: Self-pay | Admitting: *Deleted

## 2023-05-10 ENCOUNTER — Ambulatory Visit (INDEPENDENT_AMBULATORY_CARE_PROVIDER_SITE_OTHER): Payer: Medicare HMO | Admitting: Internal Medicine

## 2023-05-10 VITALS — BP 124/78 | HR 72 | Ht 63.0 in | Wt 118.6 lb

## 2023-05-10 DIAGNOSIS — Z7984 Long term (current) use of oral hypoglycemic drugs: Secondary | ICD-10-CM

## 2023-05-10 DIAGNOSIS — Z95828 Presence of other vascular implants and grafts: Secondary | ICD-10-CM

## 2023-05-10 DIAGNOSIS — I739 Peripheral vascular disease, unspecified: Secondary | ICD-10-CM

## 2023-05-10 DIAGNOSIS — E7800 Pure hypercholesterolemia, unspecified: Secondary | ICD-10-CM

## 2023-05-10 DIAGNOSIS — Z794 Long term (current) use of insulin: Secondary | ICD-10-CM

## 2023-05-10 DIAGNOSIS — E119 Type 2 diabetes mellitus without complications: Secondary | ICD-10-CM

## 2023-05-10 DIAGNOSIS — T82858A Stenosis of vascular prosthetic devices, implants and grafts, initial encounter: Secondary | ICD-10-CM

## 2023-05-10 DIAGNOSIS — E1151 Type 2 diabetes mellitus with diabetic peripheral angiopathy without gangrene: Secondary | ICD-10-CM

## 2023-05-10 LAB — POCT GLYCOSYLATED HEMOGLOBIN (HGB A1C): Hemoglobin A1C: 7.3 % — AB (ref 4.0–5.6)

## 2023-05-10 MED ORDER — METFORMIN HCL ER 500 MG PO TB24: 500.0000 mg | ORAL_TABLET | Freq: Two times a day (BID) | ORAL | 3 refills | Status: DC

## 2023-05-10 MED ORDER — FIASP FLEXTOUCH 100 UNIT/ML ~~LOC~~ SOPN: PEN_INJECTOR | SUBCUTANEOUS | 3 refills | Status: DC

## 2023-05-10 MED ORDER — LANTUS SOLOSTAR 100 UNIT/ML ~~LOC~~ SOPN: 8.0000 [IU] | PEN_INJECTOR | Freq: Every day | SUBCUTANEOUS | 3 refills | Status: DC

## 2023-05-10 NOTE — Patient Instructions (Addendum)
Please continue: - Metformin ER 500 mg 2x a day, with meals  Use: -  FiAsp 4-8 units 2-3x a day before the meal If the sugars are high between meals, you can correct with NovoLog: >200: + 1 unit >300: + 2 units >400 + 3 units   Add: - Lantus 8-10 units at night  Please return in 3-4 months.

## 2023-05-10 NOTE — Progress Notes (Signed)
Patient ID: Jill Higgins, female   DOB: [birth year], 77 y.o.   MRN: [id number]  HPI: Jill Higgins is a 77 y.o.-year-old female, returning for follow-up for DM2, dx in 2013, non-insulin-dependent (but previously on insulin in 2021), uncontrolled, with complications (CAD, PAD). Pt. previously saw Dr. Everardo All, but last visit with me 4.5 months ago.  Interim history: She has increased urination, but no blurry vision, chest pain. She had weight loss after her Whipple procedure (for what turned out to be a benign tumor) in 2020 and then after hospitalization for pancreatitis 06/2022.  Since last visit, she gained about 20 pounds after her abdominal pain resolved. Since last visit, she had severe right leg pain and had femoropopliteal bypass. Sugars are higher.   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  Reviewed HbA1c: Lab Results  Component Value Date   HGBA1C 5.6 12/18/2022   HGBA1C 7.0 (A) 08/17/2022   HGBA1C 7.0 (A) 04/20/2022   HGBA1C 6.3 (A) 01/23/2022   HGBA1C 6.8 (A) 10/24/2021   HGBA1C 7.5 (A) 07/18/2021   HGBA1C 7.9 (A) 02/01/2021   HGBA1C 8.1 (A) 12/03/2020   HGBA1C 9.5 (A) 11/02/2020   HGBA1C 8.3 (A) 08/10/2020  06/26/2022: HbA1c 8.5%   Pt is on a regimen of: - Metformin ER 1000 >> 500 mg 2x a day, with meals  - NovoLog 4-8 units 2x a day >> Novolog 2-6 units 2x a day 15 min before the meal If the sugars are high between meals, you can correct with NovoLog: >200: + 1 unit >300: + 2 units >400 + 3 units She had previous hypoglycemia with glipizide. She was on repaglinide until we started NovoLog.  Pt checks her sugars >4x a day with the Libre 2 CGM:  Previously:  Previously:  Lowest blood sugars: 54 - pm >> 49; she has hypoglycemia awareness at 70.  Highest sugar was 300s (hand surgery) >>  300s.  Glucometer: Accu-Chek guide  Pt's meals - 2x a day.  - no CKD, BUN/creatinine:   Lab Results  Component Value Date   BUN 11 04/27/2023   BUN 11 04/27/2023   CREATININE 0.80 04/27/2023   CREATININE 0.80 04/27/2023  She is not on ACE inhibitor/ARB.  -+ HL; last set of lipids: Lipid Panel w/reflex Reviewed date:10/26/2022 11:25:47 AM Interpretation: Performing Lab: Notes/Report: Testing Performed at: [facility], [address], [address], [place], [place] [address]  Cholesterol 133 <200 mg/dL    CHOL/HDL 1.6 6.2-1.3 Ratio    HDLD 82 30-85 mg/dL Values below 40 mg/dL indicate increased risk factor  Triglyceride 65 0-199 mg/dL    NHDL 51 0-865 mg/dL Range dependent upon risk factors.  LDL Chol Calc (NIH) 38 0-99 mg/dL    Lab Results  Component Value Date   CHOL 121 04/21/2023   HDL 64 04/21/2023   LDLCALC 38 04/21/2023   TRIG 97 04/21/2023   CHOLHDL 1.9 04/21/2023  On Mevacor 10 mg daily  - last eye exam was in 03/2022. No DR reportedly. + Glaucoma. Dr. Joseph Art.  - + numbness and tingling in her feet.  Last foot exam 08/17/2022.  On Gabapentin.  She has a history of HTN, overactive bladder, depression/anxiety, anemia-on iron 325 mg daily. She had Whipple procedure 10/2019 >> started to lose weight then: from 151.  ROS: + see HPI  Past Medical History:  Diagnosis Date   Bronchitis    Bulging disc    in neck   Depression with anxiety    Diabetes mellitus    Type II   Fibromyalgia    pt. denies   GERD (gastroesophageal reflux disease)    Glaucoma    History of bronchitis    Hyperlipidemia    Hypertension    Overactive bladder    Peripheral vascular disease (HCC)    Pneumonia    Sleep apnea    recent test negative for sleep apnea   Tobacco abuse    Urinary frequency    Past Surgical History:  Procedure Laterality Date   ABDOMINAL AORTAGRAM N/A 08/15/2013   Procedure: ABDOMINAL Ronny Flurry;  Surgeon: Sherren Kerns, MD;  Location: [facility] CATH LAB;   Service: Cardiovascular;  Laterality: N/A;   ABDOMINAL AORTAGRAM N/A 06/19/2014   Procedure: ABDOMINAL Ronny Flurry;  Surgeon: Sherren Kerns, MD;  Location: [facility] CATH LAB;  Service: Cardiovascular;  Laterality: N/A;   ABDOMINAL AORTOGRAM W/LOWER EXTREMITY N/A 05/10/2018   Procedure: ABDOMINAL AORTOGRAM W/LOWER EXTREMITY;  Surgeon: Sherren Kerns, MD;  Location: [facility] INVASIVE CV LAB;  Service: Cardiovascular;  Laterality: N/A;  bilateral   ABDOMINAL AORTOGRAM W/LOWER EXTREMITY N/A 04/20/2023   Procedure: ABDOMINAL AORTOGRAM W/LOWER EXTREMITY;  Surgeon: Leonie Douglas, MD;  Location: [facility] INVASIVE CV LAB;  Service: Cardiovascular;  Laterality: N/A;   Aortogram w/ PTA  05/14/08,  11-04-10   Bilateral aortogram w/ bilateral  SFA PTA  stenting    BREAST SURGERY Right    boil removal   BUNIONECTOMY     L foot in the 1980s   COLONOSCOPY     2014   DILATION AND CURETTAGE OF UTERUS     X4   ENDARTERECTOMY FEMORAL Right 06/03/2018   Procedure: RIGHT FEMORAL ENDARTERECTOMY;  Surgeon: Sherren Kerns, MD;  Location: [facility] OR;  Service: Vascular;  Laterality: Right;   Epidural shots in neck      ESOPHAGOGASTRODUODENOSCOPY (EGD) WITH PROPOFOL N/A 09/15/2019   Procedure: ESOPHAGOGASTRODUODENOSCOPY (EGD) WITH PROPOFOL;  Surgeon: Willis Modena, MD;  Location: [facility] ENDOSCOPY;  Service: Endoscopy;  Laterality: N/A;   EUS N/A 09/15/2019   Procedure: UPPER ENDOSCOPIC ULTRASOUND (EUS) LINEAR;  Surgeon: Willis Modena, MD;  Location: [facility] ENDOSCOPY;  Service: Endoscopy;  Laterality: N/A;   EYE SURGERY Bilateral 2016   cataract removal   FEMORAL-POPLITEAL BYPASS GRAFT Left 10/21/2013   Procedure: LEFT FEMORAL-POPLITEAL ARTERY BYPASS WITH SAPHENOUS VEIN GRAFT , POPLITEAL ENDARTERECTOMY ,INTRAOPERATIVE ARTERIOGRAM, vein patch angioplasty to popliteal artery;  Surgeon: Sherren Kerns, MD;  Location: [facility] OR;  Service: Vascular;  Laterality: Left;   FEMORAL-POPLITEAL BYPASS GRAFT Right 02/08/2016   Procedure: Right FEMORAL-  to Above Knee POPLITEAL ARTERY Bypass Graft with reversed saphenous vein and Common Femoral Endarterectomy  with profundoplasty;  Surgeon: Sherren Kerns, MD;  Location: [facility] OR;  Service: Vascular;  Laterality: Right;   FINE NEEDLE ASPIRATION N/A 09/15/2019   Procedure: FINE NEEDLE ASPIRATION (FNA) LINEAR;  Surgeon: Willis Modena, MD;  Location: [facility] ENDOSCOPY;  Service: Endoscopy;  Laterality: N/A;   GROIN DEBRIDEMENT Left 10/28/2013   Procedure: left inner thigh DEBRIDEMENT;  Surgeon: Sherren Kerns, MD;  Location: [facility] OR;  Service: Vascular;  Laterality: Left;   HAND SURGERY Left 2010   LOWER EXTREMITY ANGIOGRAM Bilateral 12/24/2015   Procedure: Lower Extremity Angiogram;  Surgeon: Sherren Kerns, MD;  Location: [facility] INVASIVE CV LAB;  Service: Cardiovascular;  Laterality: Bilateral;   PATCH ANGIOPLASTY Right 06/03/2018  Procedure: PATCH ANGIOPLASTY USING A XENOSURE 1CM X 14CM BIOLOGIC PATCH;  Surgeon: Sherren Kerns, MD;  Location: [facility] OR;  Service: Vascular;  Laterality: Right;   PERIPHERAL VASCULAR CATHETERIZATION N/A 12/24/2015   Procedure: Abdominal Aortogram;  Surgeon: Sherren Kerns, MD;  Location: [facility] INVASIVE CV LAB;  Service: Cardiovascular;  Laterality: N/A;   PERIPHERAL VASCULAR CATHETERIZATION Left 12/24/2015   Procedure: Peripheral Vascular Balloon Angioplasty;  Surgeon: Sherren Kerns, MD;  Location: [facility] INVASIVE CV LAB;  Service: Cardiovascular;  Laterality: Left;  drug coated balloon   PERIPHERAL VASCULAR CATHETERIZATION N/A 06/30/2016   Procedure: Abdominal Aortogram;  Surgeon: Sherren Kerns, MD;  Location: [facility] INVASIVE CV LAB;  Service: Cardiovascular;  Laterality: N/A;   PERIPHERAL VASCULAR CATHETERIZATION Bilateral 06/30/2016   Procedure: Lower Extremity Angiography;  Surgeon: Sherren Kerns, MD;  Location: [facility] INVASIVE CV LAB;  Service: Cardiovascular;  Laterality: Bilateral;   TONSILLECTOMY     Social History   Socioeconomic History   Marital status: Single    Spouse  name: Not on file   Number of children: 1   Years of education: Not on file   Highest education level: Not on file  Occupational History   Occupation: Retired    Associate Professor: Scientist, water quality  Tobacco Use   Smoking status: Former    Packs/day: .1    Types: Cigarettes    Quit date: 10/2014    Years since quitting: 8.5    Passive exposure: Never   Smokeless tobacco: Never   Tobacco comments:    pt states that she is using the E cig only   Vaping Use   Vaping Use: Some days  Substance and Sexual Activity   Alcohol use: Yes    Alcohol/week: 0.0 - 1.0 standard drinks of alcohol    Comment: Occasional; 10-22 rarely    Drug use: No   Sexual activity: Not on file  Other Topics Concern   Not on file  Social History Narrative   Drinks about 2 cups of coffee a day, and some tea    Social Determinants of Health   Financial Resource Strain: Not on file  Food Insecurity: No Food Insecurity (04/20/2023)   Hunger Vital Sign    Worried About Running Out of Food in the Last Year: Never true    Ran Out of Food in the Last Year: Never true  Transportation Needs: No Transportation Needs (04/20/2023)   PRAPARE - Administrator, Civil Service (Medical): No    Lack of Transportation (Non-Medical): No  Physical Activity: Not on file  Stress: Not on file  Social Connections: Not on file  Intimate Partner Violence: Not At Risk (04/20/2023)   Humiliation, Afraid, Rape, and Kick questionnaire    Fear of Current or Ex-Partner: No    Emotionally Abused: No    Physically Abused: No    Sexually Abused: No   Current Outpatient Medications on File Prior to Visit  Medication Sig Dispense Refill   amLODipine (NORVASC) 5 MG tablet Take 5 mg by mouth daily.     aspirin EC 81 MG tablet Take 81 mg by mouth at bedtime.     atorvastatin (LIPITOR) 80 MG tablet Take 1 tablet (80 mg total) by mouth daily. 30 tablet 11   clopidogrel (PLAVIX) 75 MG tablet Take 1 tablet (75 mg total) by mouth daily with breakfast.  30 tablet 3   Continuous Blood Gluc Sensor (FREESTYLE LIBRE 2 SENSOR) MISC 1 Device by Does not apply route every  14 (fourteen) days. 6 each 3   diclofenac Sodium (VOLTAREN) 1 % GEL Apply 1 application. topically daily as needed (leg pain).     diphenhydrAMINE (BENADRYL) 25 MG tablet Take 25 mg by mouth daily as needed for allergies.     diphenhydramine-acetaminophen (TYLENOL PM) 25-500 MG TABS tablet Take 1 tablet by mouth at bedtime as needed (sleep).     DULoxetine (CYMBALTA) 60 MG capsule Take 60 mg by mouth daily.     ferrous sulfate 325 (65 FE) MG EC tablet Take 325 mg by mouth daily.     gabapentin (NEURONTIN) 300 MG capsule Take 600 mg by mouth 3 (three) times daily.     ibuprofen (ADVIL) 200 MG tablet Take 200 mg by mouth every 6 (six) hours as needed for moderate pain.     insulin aspart (NOVOLOG FLEXPEN) 100 UNIT/ML FlexPen Inject 3-6 Units into the skin 3 (three) times daily with meals. 6 mL 0   Insulin Pen Needle 32G X 4 MM MISC Use 2x a day 200 each 3   latanoprost (XALATAN) 0.005 % ophthalmic solution Place 1 drop into both eyes at bedtime.     lipase/protease/amylase 24000-76000 units CPEP Take 1 capsule (24,000 Units total) by mouth 3 (three) times daily before meals. (Patient taking differently: Take 24,000-48,000 Units by mouth See admin instructions. Take 2 tablet before meals and 1 tablet after meals and 1 tablet with a snack) 270 capsule 0   Magnesium 250 MG TABS Take 250 mg by mouth daily.     Menthol-Methyl Salicylate (SALONPAS PAIN RELIEF PATCH) PTCH Apply 1 patch topically daily as needed (pain).     metFORMIN (GLUCOPHAGE-XR) 500 MG 24 hr tablet Take 1 tablet (500 mg total) by mouth 2 (two) times daily with a meal. (Patient taking differently: Take 1,000 mg by mouth 2 (two) times daily with a meal.) 180 tablet 3   metoprolol succinate (TOPROL-XL) 100 MG 24 hr tablet Take 100 mg by mouth daily. Take with or immediately following a meal.     Multiple Vitamins-Minerals  (CENTRUM SILVER 50+WOMEN) TABS Take 1 tablet by mouth daily.     OVER THE COUNTER MEDICATION Take 1 tablet by mouth daily. Brain health supplement     oxybutynin (DITROPAN) 5 MG tablet Take 5 mg by mouth 2 (two) times daily.     oxyCODONE-acetaminophen (PERCOCET/ROXICET) 5-325 MG tablet Take 1 tablet by mouth every 6 (six) hours as needed for severe pain. 6 tablet 0   pantoprazole (PROTONIX) 40 MG tablet Take 40 mg by mouth 2 (two) times daily.     polyethylene glycol (MIRALAX / GLYCOLAX) 17 g packet Take 17 g by mouth daily as needed. (Patient not taking: Reported on 04/19/2023) 14 each 0   temazepam (RESTORIL) 30 MG capsule Take 30 mg by mouth at bedtime as needed for sleep.  0   timolol (TIMOPTIC) 0.5 % ophthalmic solution Place 1 drop into both eyes every morning.     traMADol (ULTRAM) 50 MG tablet Take 50 mg by mouth every 8 (eight) hours as needed for moderate pain.     traZODone (DESYREL) 50 MG tablet Take 75 mg by mouth at bedtime as needed for sleep.     No current facility-administered medications on file prior to visit.   Allergies  Allergen Reactions   Wound Dressing Adhesive Other (See Comments) and Rash    Pt states that tape and electrodes leave red "scars" on her skin and her skin is very sensitive.  Paper  tape ok to use    Family History  Problem Relation Age of Onset   Heart disease Father    Heart attack Father        MI at age 47   Hypertension Mother    Alzheimer's disease Mother    Hypertension Sister    Diabetes Brother    Hyperlipidemia Brother    Hypertension Brother    Heart disease Brother    Heart attack Brother    Breast cancer Maternal Aunt    PE: BP 124/78   Pulse 72   Ht 5\' 3"  (1.6 m)   Wt 118 lb 9.6 oz (53.8 kg)   SpO2 96%   BMI 21.01 kg/m  Wt Readings from Last 3 Encounters:  05/10/23 118 lb 9.6 oz (53.8 kg)  04/27/23 120 lb (54.4 kg)  04/20/23 120 lb (54.4 kg)   Constitutional: thin, in NAD Eyes: EOMI, no exophthalmos ENT: no  thyromegaly, no cervical lymphadenopathy Cardiovascular: RRR, No MRG Respiratory: CTA B Musculoskeletal: no deformities Skin:  no rashes Neurological: no tremor with outstretched hands  ASSESSMENT: 1. DM2, insulin-dependent, controlled, with complications - CAD - PAD - s/p stents  Component     Latest Ref Rng 04/20/2022  Glucose     65 - 99 mg/dL 914 (H)   C-Peptide     0.80 - 3.85 ng/mL 1.50     2.  Hyperlipidemia  PLAN:  1. Patient with longstanding, uncontrolled, type 2 diabetes, on oral antidiabetic regimen with metformin and also mealtime insulin.  Is a history of pancreatitis so we are not able to use GLP-1 receptor agonist for her.  At last visit, sugars appear to be fluctuating within the target range with only occasional hyperglycemic spikes up to 200, but also mild lows, down to the 60s and rarely lower.  We reduced her metformin dose at that time.  We did not change the dose of her NovoLog.  She was only eating 2 meals a day and I recommended to try to eat 3 meals a day, due to weight loss.  HbA1c at that time was excellent, at 5.6%. CGM interpretation: -At today's visit, we reviewed her CGM downloads: It appears that 45% of values are in target range (goal >70%), while 54% are higher than 180 (goal <25%), and 1% are lower than 70 (goal <4%).  The calculated average blood sugar is 192.  The projected HbA1c for the next 3 months (GMI) is 7.9%. -Reviewing the CGM trends, sugars are higher than before, possibly due to return of appetite.  Sugars are now mostly fluctuating around the upper limit of the target range with spikes after meals.  For now, I suggested to add basal insulin, starting at a low dose and increasing as tolerated.  I did not advise her to increase the dose of mealtime insulin yet, but we may need to do so if the sugars remain elevated after meals after starting basal insulin.  -She tells me that NovoLog is not covered for her anymore so I sent Fiasp instead.  We  discussed that if this is not covered, we can switch to another insulin, per insurance preference. -Her CGM is expensive for her so I advised her to talk with her insurance to see if they prefer that I sent the prescription to a supplier instead. - I suggested to:  Patient Instructions  Please continue: - Metformin ER 500 mg 2x a day, with meals  Use: -  FiAsp 4-8 units 2-3x a day  before the meal If the sugars are high between meals, you can correct with NovoLog: >200: + 1 unit >300: + 2 units >400 + 3 units   Add: - Lantus 8-10 units at night  Please return in 3-4 months.  - we checked her HbA1c: 7.3% (higher) - advised to check sugars at different times of the day - 4x a day, rotating check times - advised for yearly eye exams >> she is due - return to clinic in 3-4 months  2. HL -Reviewed latest lipid panel from 10/2022: All fractions at goal -she continues on lovastatin 20 mg daily without side effects  Carlus Pavlov, MD PhD [facility] Endocrinology

## 2023-05-18 ENCOUNTER — Encounter: Payer: Self-pay | Admitting: Family Medicine

## 2023-05-21 ENCOUNTER — Other Ambulatory Visit: Payer: Self-pay | Admitting: Family Medicine

## 2023-05-21 ENCOUNTER — Ambulatory Visit
Admission: RE | Admit: 2023-05-21 | Discharge: 2023-05-21 | Disposition: A | Discharge status: Home / Self Care | Payer: Medicare HMO | Source: Ambulatory Visit | Attending: Family Medicine | Admitting: Family Medicine

## 2023-05-21 DIAGNOSIS — M25551 Pain in right hip: Secondary | ICD-10-CM

## 2023-05-21 DIAGNOSIS — M79651 Pain in right thigh: Secondary | ICD-10-CM | POA: Diagnosis not present

## 2023-05-21 DIAGNOSIS — M25561 Pain in right knee: Secondary | ICD-10-CM | POA: Diagnosis not present

## 2023-05-21 NOTE — Progress Notes (Signed)
[facility] [place]  ASSESSMENT / PLAN: Jill Higgins is a 77 y.o. female with complex personal vascular history with bilateral lower extremity intervention.  Recent angiography performed to evaluate right lower extremity possible inflow stenosis unrevealing but was complicated by flow-limiting arterial dissection.  This was managed intraoperatively with covered stenting 04/20/23.  Recommend:  Abstinence from all tobacco products. Blood glucose control with goal A1c < 7%. Blood pressure control with goal blood pressure < 140/90 mmHg. Lipid reduction therapy with goal LDL-C <100 mg/dL. Aspirin 81mg  PO QD.  Clopidogrel 75mg  PO QD. Atorvastatin 40-80mg  PO QD (or other "high intensity" statin therapy).  No evidence of flow-limiting stenosis to bilateral lower extremities.  Reviewed intraoperative angiogram findings with her.  I think her lower extremity symptoms are likely musculoskeletal in origin.  I will see her back in 1 year with repeat ankle-brachial index.  CHIEF COMPLAINT: follow up angiogram  HISTORY OF PRESENT ILLNESS: Jill Higgins is a 77 y.o. female with complex past vascular history who returns to clinic after angiogram done for right lower extremity pain.  Preangiogram noninvasive testing suggested possible right external iliac artery stenosis and possible left leg bypass stenosis.  Neither was identified on angiogram.  Her angiogram was complicated by her device malfunction which was successfully managed with covered stenting.  The patient is doing reasonably well on my evaluation.  She continues to have right hip and knee pain.  She recently sustained a fall and had some x-rays made.  The pain is not typical of claudication.  She describes point tenderness in her hip.  I do not think she walks fast enough to claudicate.  She does not have any rest pain in her foot.  She has no ulcers.  VASCULAR SURGICAL HISTORY:  Left fem-popliteal vein bypass in December  of 2014 by Dr. Darrick Penna, and right femoral to above knee popliteal vein bypass in March of 2017 by Dr. Darrick Penna. She also had to latera have a redo right femoral endarterectomy in July of 2019 by Dr. Darrick Penna due to disabling claudication.    Past Medical History:  Diagnosis Date   Bronchitis    Bulging disc    in neck   Depression with anxiety    Diabetes mellitus    Type II   Fibromyalgia    pt. denies   GERD (gastroesophageal reflux disease)    Glaucoma    History of bronchitis    Hyperlipidemia    Hypertension    Overactive bladder    Peripheral vascular disease (HCC)    Pneumonia    Sleep apnea    recent test negative for sleep apnea   Tobacco abuse    Urinary frequency     Past Surgical History:  Procedure Laterality Date   ABDOMINAL AORTAGRAM N/A 08/15/2013   Procedure: ABDOMINAL Ronny Flurry;  Surgeon: Sherren Kerns, MD;  Location: [facility] CATH LAB;  Service: Cardiovascular;  Laterality: N/A;   ABDOMINAL AORTAGRAM N/A 06/19/2014   Procedure: ABDOMINAL Ronny Flurry;  Surgeon: Sherren Kerns, MD;  Location: [facility] C[facility] LAB;  Service: Cardiovascular;  Laterality: N/A;   ABDOMINAL AORTOGRAM W/LOWER EXTREMITY N/A 05/10/2018   Procedure: ABDOMINAL AORTOGRAM W/LOWER EXTREMITY;  Surgeon: Sherren Kerns, MD;  Location: [facility] INVASIVE CV LAB;  Service: Cardiovascular;  Laterality: N/A;  bilateral   ABDOMINAL AORTOGRAM W/LOWER EXTREMITY N/A 04/20/2023   Procedure: ABDOMINAL AORTOGRAM W/LOWER EXTREMITY;  Surgeon: Leonie Douglas, MD;  Location: [facility] INVASIVE CV LAB;  Service: Cardiovascular;  Laterality: N/A;  Aortogram w/ PTA  05/14/08,  11-04-10   Bilateral aortogram w/ bilateral  SFA PTA  stenting    BREAST SURGERY Right    boil removal   BUNIONECTOMY     L foot in the 1980s   COLONOSCOPY     2014   DILATION AND CURETTAGE OF UTERUS     X4   ENDARTERECTOMY FEMORAL Right 06/03/2018   Procedure: RIGHT FEMORAL ENDARTERECTOMY;  Surgeon: Sherren Kerns, MD;  Location: [facility] OR;  Service: Vascular;   Laterality: Right;   Epidural shots in neck      ESOPHAGOGASTRODUODENOSCOPY (EGD) WITH PROPOFOL N/A 09/15/2019   Procedure: ESOPHAGOGASTRODUODENOSCOPY (EGD) WITH PROPOFOL;  Surgeon: Willis Modena, MD;  Location: [facility] ENDOSCOPY;  Service: Endoscopy;  Laterality: N/A;   EUS N/A 09/15/2019   Procedure: UPPER ENDOSCOPIC ULTRASOUND (EUS) LINEAR;  Surgeon: Willis Modena, MD;  Location: [facility] ENDOSCOPY;  Service: Endoscopy;  Laterality: N/A;   EYE SURGERY Bilateral 2016   cataract removal   FEMORAL-POPLITEAL BYPASS GRAFT Left 10/21/2013   Procedure: LEFT FEMORAL-POPLITEAL ARTERY BYPASS WITH SAPHENOUS VEIN GRAFT , POPLITEAL ENDARTERECTOMY ,INTRAOPERATIVE ARTERIOGRAM, vein patch angioplasty to popliteal artery;  Surgeon: Sherren Kerns, MD;  Location[facility] OR;  Service: Vascular;  Laterality: Left;   FEMORAL-POPLITEAL BYPASS GRAFT Right 02/08/2016   Procedure: Right FEMORAL- to Above Knee POPLITEAL ARTERY Bypass Graft with reversed saphenous vein and Common Femoral Endarterectomy  with profundoplasty;  Surgeon: Sherren Kerns, MD;  Location: [facility] OR;  Service: Vascular;  Laterality: Right;   FINE NEEDLE ASPIRATION N/A 09/15/2019   Procedure: FINE NEEDLE ASPIRATION (FNA) LINEAR;  Surgeon: Willis Modena, MD;  Location: [facility] ENDOSCOPY;  Service: Endoscopy;  Laterality: N/A;   GROIN DEBRIDEMENT Left 10/28/2013   Procedure: left inner thigh DEBRIDEMENT;  Surgeon: Sherren Kerns, MD;  Location: [facility] OR;  Service: Vascular;  Laterality: Left;   HAND SURGERY Left 2010   LOWER EXTREMITY ANGIOGRAM Bilateral 12/24/2015   Procedure: Lower Extremity Angiogram;  Surgeon: Sherren Kerns, MD;  Location: [facility] INVASIVE CV LAB;  Service: Cardiovascular;  Laterality: Bilateral;   PATCH ANGIOPLASTY Right 06/03/2018   Procedure: PATCH ANGIOPLASTY USING A XENOSURE 1CM X 14CM BIOLOGIC PATCH;  Surgeon: Sherren Kerns, MD;  Location: [facility] OR;  Service: Vascular;  Laterality: Right;   PERIPHERAL VASCULAR CATHETERIZATION N/A 12/24/2015    Procedure: Abdominal Aortogram;  Surgeon: Sherren Kerns, MD;  Location: [facility] INVASIVE CV LAB;  Service: Cardiovascular;  Laterality: N/A;   PERIPHERAL VASCULAR CATHETERIZATION Left 12/24/2015   Procedure: Peripheral Vascular Balloon Angioplasty;  Surgeon: Sherren Kerns, MD;  Location: [facility] INVASIVE CV LAB;  Service: Cardiovascular;  Laterality: Left;  drug coated balloon   PERIPHERAL VASCULAR CATHETERIZATION N/A 06/30/2016   Procedure: Abdominal Aortogram;  Surgeon: Sherren Kerns, MD;  Location: [facility] INVASIVE CV LAB;  Service: Cardiovascular;  Laterality: N/A;   PERIPHERAL VASCULAR CATHETERIZATION Bilateral 06/30/2016   Procedure: Lower Extremity Angiography;  Surgeon: Sherren Kerns, MD;  Location: [facility] INVASIVE CV LAB;  Service: Cardiovascular;  Laterality: Bilateral;   TONSILLECTOMY      Family History  Problem Relation Age of Onset   Heart disease Father    Heart attack Father        MI at age 34   Hypertension Mother    Alzheimer's disease Mother    Hypertension Sister    Diabetes Brother    Hyperlipidemia Brother    Hypertension Brother    Heart disease Brother    Heart attack Brother  Breast cancer Maternal Aunt     Social History   Socioeconomic History   Marital status: Single    Spouse name: Not on file   Number of children: 1   Years of education: Not on file   Highest education level: Not on file  Occupational History   Occupation: Retired    Associate Professor: Scientist, water quality  Tobacco Use   Smoking status: Former    Packs/day: .1    Types: Cigarettes    Quit date: 10/2014    Years since quitting: 8.5    Passive exposure: Never   Smokeless tobacco: Never   Tobacco comments:    pt states that she is using the E cig only   Vaping Use   Vaping Use: Some days  Substance and Sexual Activity   Alcohol use: Yes    Alcohol/week: 0.0 - 1.0 standard drinks of alcohol    Comment: Occasional; 10-22 rarely    Drug use: No   Sexual activity: Not on file  Other Topics Concern   Not  on file  Social History Narrative   Drinks about 2 cups of coffee a day, and some tea    Social Determinants of Health   Financial Resource Strain: Not on file  Food Insecurity: No Food Insecurity (04/20/2023)   Hunger Vital Sign    Worried About Running Out of Food in the Last Year: Never true    Ran Out of Food in the Last Year: Never true  Transportation Needs: No Transportation Needs (04/20/2023)   PRAPARE - Administrator, Civil Service (Medical): No    Lack of Transportation (Non-Medical): No  Physical Activity: Not on file  Stress: Not on file  Social Connections: Not on file  Intimate Partner Violence: Not At Risk (04/20/2023)   Humiliation, Afraid, Rape, and Kick questionnaire    Fear of Current or Ex-Partner: No    Emotionally Abused: No    Physically Abused: No    Sexually Abused: No    Allergies  Allergen Reactions   Wound Dressing Adhesive Other (See Comments) and Rash    Pt states that tape and electrodes leave red "scars" on her skin and her skin is very sensitive.  Paper tape ok to use     Current Outpatient Medications  Medication Sig Dispense Refill   amLODipine (NORVASC) 5 MG tablet Take 5 mg by mouth daily.     aspirin EC 81 MG tablet Take 81 mg by mouth at bedtime.     atorvastatin (LIPITOR) 80 MG tablet Take 1 tablet (80 mg total) by mouth daily. 30 tablet 11   clopidogrel (PLAVIX) 75 MG tablet Take 1 tablet (75 mg total) by mouth daily with breakfast. 30 tablet 3   Continuous Blood Gluc Sensor (FREESTYLE LIBRE 2 SENSOR) MISC 1 Device by Does not apply route every 14 (fourteen) days. 6 each 3   diclofenac Sodium (VOLTAREN) 1 % GEL Apply 1 application. topically daily as needed (leg pain).     diphenhydrAMINE (BENADRYL) 25 MG tablet Take 25 mg by mouth daily as needed for allergies.     diphenhydramine-acetaminophen (TYLENOL PM) 25-500 MG TABS tablet Take 1 tablet by mouth at bedtime as needed (sleep).     DULoxetine (CYMBALTA) 60 MG capsule Take  60 mg by mouth daily.     ferrous sulfate 325 (65 FE) MG EC tablet Take 325 mg by mouth daily.     gabapentin (NEURONTIN) 300 MG capsule Take 600 mg by mouth 3 (three)  times daily.     ibuprofen (ADVIL) 200 MG tablet Take 200 mg by mouth every 6 (six) hours as needed for moderate pain.     insulin aspart (FIASP FLEXTOUCH) 100 UNIT/ML FlexTouch Pen Inject 4-8 units under skin before meals, 2-3x a day 15 mL 3   insulin aspart (NOVOLOG FLEXPEN) 100 UNIT/ML FlexPen Inject 3-6 Units into the skin 3 (three) times daily with meals. 6 mL 0   insulin glargine (LANTUS SOLOSTAR) 100 UNIT/ML Solostar Pen Inject 8-10 Units into the skin at bedtime. 15 mL 3   Insulin Pen Needle 32G X 4 MM MISC Use 2x a day 200 each 3   latanoprost (XALATAN) 0.005 % ophthalmic solution Place 1 drop into both eyes at bedtime.     lipase/protease/amylase 24000-76000 units CPEP Take 1 capsule (24,000 Units total) by mouth 3 (three) times daily before meals. (Patient taking differently: Take 24,000-48,000 Units by mouth See admin instructions. Take 2 tablet before meals and 1 tablet after meals and 1 tablet with a snack) 270 capsule 0   losartan (COZAAR) 25 MG tablet Take 25 mg by mouth daily.     Magnesium 250 MG TABS Take 250 mg by mouth daily.     Menthol-Methyl Salicylate (SALONPAS PAIN RELIEF PATCH) PTCH Apply 1 patch topically daily as needed (pain).     metFORMIN (GLUCOPHAGE-XR) 500 MG 24 hr tablet Take 1 tablet (500 mg total) by mouth 2 (two) times daily with a meal. 180 tablet 3   metoprolol succinate (TOPROL-XL) 100 MG 24 hr tablet Take 100 mg by mouth daily. Take with or immediately following a meal.     Multiple Vitamins-Minerals (CENTRUM SILVER 50+WOMEN) TABS Take 1 tablet by mouth daily.     OVER THE COUNTER MEDICATION Take 1 tablet by mouth daily. Brain health supplement     oxybutynin (DITROPAN) 5 MG tablet Take 5 mg by mouth 2 (two) times daily.     oxyCODONE-acetaminophen (PERCOCET/ROXICET) 5-325 MG tablet Take 1  tablet by mouth every 6 (six) hours as needed for severe pain. 6 tablet 0   pantoprazole (PROTONIX) 40 MG tablet Take 40 mg by mouth 2 (two) times daily.     polyethylene glycol (MIRALAX / GLYCOLAX) 17 g packet Take 17 g by mouth daily as needed. 14 each 0   temazepam (RESTORIL) 30 MG capsule Take 30 mg by mouth at bedtime as needed for sleep.  0   timolol (TIMOPTIC) 0.5 % ophthalmic solution Place 1 drop into both eyes every morning.     traMADol (ULTRAM) 50 MG tablet Take 50 mg by mouth every 8 (eight) hours as needed for moderate pain.     traZODone (DESYREL) 50 MG tablet Take 75 mg by mouth at bedtime as needed for sleep.     No current facility-administered medications for this visit.    PHYSICAL EXAM Vitals:   05/22/23 0936  BP: (!) 161/92  Pulse: (!) 53  Resp: 20  Temp: 97.7 F (36.5 C)  SpO2: 98%  Weight: 53.1 kg  Height: 5\' 3"  (1.6 m)    Thin elderly woman in no acute distress Regular rate and rhythm Unlabored breathing No palpable pedal pulses Feet are warm and well-perfused   PERTINENT LABORATORY AND RADIOLOGIC DATA  Most recent CBC    Latest Ref Rng & Units 04/27/2023    4:14 PM 04/27/2023    3:56 PM 04/20/2023    3:19 PM  CBC  WBC 4.0 - 10.5 K/uL  6.0  6.7  Hemoglobin 12.0 - 15.0 g/dL 86.5  78.4  69.6   Hematocrit 36.0 - 46.0 % 35.0  37.0  43.4   Platelets 150 - 400 K/uL  334  260      Most recent CMP    Latest Ref Rng & Units 04/27/2023    4:14 PM 04/27/2023    3:56 PM 04/20/2023    3:19 PM  CMP  Glucose 70 - 99 mg/dL 50  53  295   BUN 8 - 23 mg/dL 11  11  14    Creatinine 0.44 - 1.00 mg/dL 2.84  1.32  4.40   Sodium 135 - 145 mmol/L 145  142  135   Potassium 3.5 - 5.1 mmol/L 3.6  3.5  4.0   Chloride 98 - 111 mmol/L 108  109  102   CO2 22 - 32 mmol/L  24  22   Calcium 8.9 - 10.3 mg/dL  9.2  8.9   Total Protein 6.5 - 8.1 g/dL   6.9   Total Bilirubin 0.3 - 1.2 mg/dL   0.5   Alkaline Phos 38 - 126 U/L   96   AST 15 - 41 U/L   30   ALT 0 - 44 U/L   18      Renal function CrCl cannot be calculated (Patient's most recent lab result is older than the maximum 21 days allowed.).  Hemoglobin A1C  Date Value  12/18/2022 5.6 %  05/13/2020 8.5    LDL Chol Calc (NIH)  Date Value Ref Range Status  02/08/2022 21 0 - 99 mg/dL Final   LDL Cholesterol  Date Value Ref Range Status  04/21/2023 38 0 - 99 mg/dL Final    Comment:           Total Cholesterol/HDL:CHD Risk Coronary Heart Disease Risk Table                     Men   Women  1/2 Average Risk   3.4   3.3  Average Risk       5.0   4.4  2 X Average Risk   9.6   7.1  3 X Average Risk  23.4   11.0        Use the calculated Patient Ratio above and the CHD Risk Table to determine the patient's CHD Risk.        ATP III CLASSIFICATION (LDL):  <100     mg/dL   Optimal  102-725  mg/dL   Near or Above                    Optimal  130-159  mg/dL   Borderline  366-440  mg/dL   High  >347     mg/dL   Very High Performed at [facility], [address] [place], [place] [address]      Right lower extremity arterial duplex: Patent femoral to popliteal bypass graft with increased velocity at  the distal EIA and inflow artery (50-74% stenosis).   +-------+-----------+-----------+------------+------------+  ABI/TBIToday's ABIToday's TBIPrevious ABIPrevious TBI  +-------+-----------+-----------+------------+------------+  Right 0.95       0.74       1.05        0.88          +-------+-----------+-----------+------------+------------+  Left  0.99       0.61       1.06        0.84          +-------+-----------+-----------+------------+------------+  Rande Brunt. Lenell Antu, MD FACS [facility] Phone Number: [phone] 05/24/2023 8:31 AM   Total time spent on preparing this encounter including chart review, data review, collecting history, examining the patient, coordinating care for this established patient, 30  minutes.  Portions of this report may have been transcribed using voice recognition software.  Every effort has been made to ensure accuracy; however, inadvertent computerized transcription errors may still be present.

## 2023-05-22 ENCOUNTER — Ambulatory Visit (HOSPITAL_COMMUNITY)
Admission: RE | Admit: 2023-05-22 | Discharge: 2023-05-22 | Disposition: A | Discharge status: Home / Self Care | Payer: Medicare HMO | Source: Ambulatory Visit | Attending: Vascular Surgery | Admitting: Vascular Surgery

## 2023-05-22 ENCOUNTER — Ambulatory Visit (INDEPENDENT_AMBULATORY_CARE_PROVIDER_SITE_OTHER): Payer: Medicare HMO | Admitting: Vascular Surgery

## 2023-05-22 ENCOUNTER — Encounter: Payer: Self-pay | Admitting: Vascular Surgery

## 2023-05-22 VITALS — BP 161/92 | HR 53 | Temp 97.7°F | Resp 20 | Ht 63.0 in | Wt 117.0 lb

## 2023-05-22 DIAGNOSIS — I739 Peripheral vascular disease, unspecified: Secondary | ICD-10-CM | POA: Insufficient documentation

## 2023-05-22 DIAGNOSIS — T82858A Stenosis of vascular prosthetic devices, implants and grafts, initial encounter: Secondary | ICD-10-CM | POA: Diagnosis not present

## 2023-05-22 DIAGNOSIS — Z95828 Presence of other vascular implants and grafts: Secondary | ICD-10-CM

## 2023-05-22 LAB — VAS US ABI WITH/WO TBI
Left ABI: 0.99
Right ABI: 0.95

## 2023-06-04 ENCOUNTER — Other Ambulatory Visit: Payer: Self-pay

## 2023-06-04 DIAGNOSIS — I739 Peripheral vascular disease, unspecified: Secondary | ICD-10-CM

## 2023-06-05 DIAGNOSIS — K861 Other chronic pancreatitis: Secondary | ICD-10-CM | POA: Diagnosis not present

## 2023-06-05 DIAGNOSIS — K8689 Other specified diseases of pancreas: Secondary | ICD-10-CM | POA: Diagnosis not present

## 2023-06-05 DIAGNOSIS — I700 Atherosclerosis of aorta: Secondary | ICD-10-CM | POA: Diagnosis not present

## 2023-06-05 DIAGNOSIS — R109 Unspecified abdominal pain: Secondary | ICD-10-CM | POA: Diagnosis not present

## 2023-06-07 DIAGNOSIS — F17200 Nicotine dependence, unspecified, uncomplicated: Secondary | ICD-10-CM | POA: Diagnosis not present

## 2023-06-07 DIAGNOSIS — I10 Essential (primary) hypertension: Secondary | ICD-10-CM | POA: Diagnosis not present

## 2023-06-07 DIAGNOSIS — M25561 Pain in right knee: Secondary | ICD-10-CM | POA: Diagnosis not present

## 2023-06-07 DIAGNOSIS — M25551 Pain in right hip: Secondary | ICD-10-CM | POA: Diagnosis not present

## 2023-06-15 DIAGNOSIS — M25551 Pain in right hip: Secondary | ICD-10-CM | POA: Diagnosis not present

## 2023-06-25 DIAGNOSIS — K861 Other chronic pancreatitis: Secondary | ICD-10-CM | POA: Diagnosis not present

## 2023-06-25 DIAGNOSIS — K8689 Other specified diseases of pancreas: Secondary | ICD-10-CM | POA: Diagnosis not present

## 2023-06-25 DIAGNOSIS — Z9049 Acquired absence of other specified parts of digestive tract: Secondary | ICD-10-CM | POA: Diagnosis not present

## 2023-06-25 DIAGNOSIS — E0800 Diabetes mellitus due to underlying condition with hyperosmolarity without nonketotic hyperglycemic-hyperosmolar coma (NKHHC): Secondary | ICD-10-CM | POA: Diagnosis not present

## 2023-06-25 DIAGNOSIS — Z90410 Acquired total absence of pancreas: Secondary | ICD-10-CM | POA: Diagnosis not present

## 2023-06-28 DIAGNOSIS — E7800 Pure hypercholesterolemia, unspecified: Secondary | ICD-10-CM | POA: Diagnosis not present

## 2023-06-28 DIAGNOSIS — I10 Essential (primary) hypertension: Secondary | ICD-10-CM | POA: Diagnosis not present

## 2023-06-28 DIAGNOSIS — R300 Dysuria: Secondary | ICD-10-CM | POA: Diagnosis not present

## 2023-06-28 DIAGNOSIS — F17200 Nicotine dependence, unspecified, uncomplicated: Secondary | ICD-10-CM | POA: Diagnosis not present

## 2023-06-28 DIAGNOSIS — F419 Anxiety disorder, unspecified: Secondary | ICD-10-CM | POA: Diagnosis not present

## 2023-06-28 DIAGNOSIS — E1151 Type 2 diabetes mellitus with diabetic peripheral angiopathy without gangrene: Secondary | ICD-10-CM | POA: Diagnosis not present

## 2023-06-28 DIAGNOSIS — K219 Gastro-esophageal reflux disease without esophagitis: Secondary | ICD-10-CM | POA: Diagnosis not present

## 2023-06-28 DIAGNOSIS — G4700 Insomnia, unspecified: Secondary | ICD-10-CM | POA: Diagnosis not present

## 2023-06-28 DIAGNOSIS — E1165 Type 2 diabetes mellitus with hyperglycemia: Secondary | ICD-10-CM | POA: Diagnosis not present

## 2023-06-28 DIAGNOSIS — Z794 Long term (current) use of insulin: Secondary | ICD-10-CM | POA: Diagnosis not present

## 2023-07-04 NOTE — Addendum Note (Signed)
Addended by: Pollie Meyer on: 07/04/2023 04:24 PM   Modules accepted: Orders

## 2023-07-10 ENCOUNTER — Other Ambulatory Visit: Payer: Self-pay | Admitting: Internal Medicine

## 2023-07-10 ENCOUNTER — Other Ambulatory Visit: Payer: Self-pay

## 2023-07-10 MED ORDER — INSULIN ASPART 100 UNIT/ML IJ SOLN: 8.0000 [IU] | Freq: Three times a day (TID) | INTRAMUSCULAR | 3 refills | Status: DC

## 2023-07-10 MED ORDER — NOVOLOG FLEXPEN 100 UNIT/ML ~~LOC~~ SOPN: PEN_INJECTOR | SUBCUTANEOUS | 11 refills | Status: DC

## 2023-07-16 DIAGNOSIS — M25561 Pain in right knee: Secondary | ICD-10-CM | POA: Diagnosis not present

## 2023-07-19 ENCOUNTER — Other Ambulatory Visit: Payer: Self-pay | Admitting: Internal Medicine

## 2023-07-20 ENCOUNTER — Other Ambulatory Visit: Payer: Self-pay

## 2023-07-20 MED ORDER — METFORMIN HCL ER 500 MG PO TB24: 500.0000 mg | ORAL_TABLET | Freq: Two times a day (BID) | ORAL | 2 refills | Status: DC

## 2023-07-30 ENCOUNTER — Telehealth: Payer: Self-pay

## 2023-07-30 NOTE — Telephone Encounter (Signed)
Per my last OV recs: Please continue: - Metformin ER 500 mg 2x a day, with meals   Use: -  FiAsp/NovoLog 4-8 units 2-3x a day before the meals If the sugars are high between meals, you can correct with FiAsp/NovoLog >200: + 1 unit >300: + 2 units >400 + 3 units               Add: - Lantus 8-10 units at night

## 2023-07-30 NOTE — Telephone Encounter (Signed)
Patient called stating that she went to see her Orthopaedic provider and she was put on Steroids for hip pain and he gave her injections while  she was in the office. Then he also prescribed her a rx for Prednisone to take for 6-7 days. He did advise to her that her BS would become elevated but to watch for it. Patient states that her BS has been running 250-405.   Current meds: Novolog 3-4 times a day, Metformin 1 po BID Has not been taking the Lantus at all.    Current Blood sugar per Libre: 357 and rising, unable to do a fingerstick CBG (expired supplies)   Saw Ortho: 06/15/23

## 2023-07-30 NOTE — Telephone Encounter (Signed)
LMTRC  JMiller,RMA 

## 2023-08-01 NOTE — Telephone Encounter (Signed)
LMTRC  JMiller,RMA 

## 2023-08-02 MED ORDER — LANTUS SOLOSTAR 100 UNIT/ML ~~LOC~~ SOPN: 8.0000 [IU] | PEN_INJECTOR | Freq: Every day | SUBCUTANEOUS | 3 refills | Status: DC

## 2023-08-02 NOTE — Telephone Encounter (Signed)
Pt has been notified and voices understanding. Lantus has been sent.

## 2023-08-02 NOTE — Addendum Note (Signed)
Addended by: Pollie Meyer on: 08/02/2023 02:43 PM   Modules accepted: Orders

## 2023-09-10 ENCOUNTER — Ambulatory Visit: Payer: Medicare HMO | Admitting: Internal Medicine

## 2023-09-10 NOTE — Progress Notes (Deleted)
Patient ID: Jill Higgins, female   DOB: August 18, 1946, 77 y.o.   MRN: [id number]  HPI: Jill Higgins is a 77 y.o.-year-old female, returning for follow-up for DM2, dx in 2013, insulin-dependent, uncontrolled, with complications (CAD, PAD). Pt. previously saw Dr. Everardo All, but last visit with me 4.xtrosf  months ago.  Interim history: She has increased urination, but no blurry vision, chest pain. She had weight loss after her Whipple procedure (for what turned out to be a benign tumor) in 2020 and then after hospitalization for pancreatitis 06/2022.  However, before last visit, she gained about 20 pounds after her abdominal pain resolved. Before last visit, she had severe right leg pain and had femoropopliteal bypass.   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  Reviewed HbA1c: Lab Results  Component Value Date   HGBA1C 7.3 (A) 05/10/2023   HGBA1C 5.6 12/18/2022   HGBA1C 7.0 (A) 08/17/2022   HGBA1C 7.0 (A) 04/20/2022   HGBA1C 6.3 (A) 01/23/2022   HGBA1C 6.8 (A) 10/24/2021   HGBA1C 7.5 (A) 07/18/2021   HGBA1C 7.9 (A) 02/01/2021   HGBA1C 8.1 (A) 12/03/2020   HGBA1C 9.5 (A) 11/02/2020  06/26/2022: HbA1c 8.5%   At last visit she was on: - Metformin ER 1000 >> 500 mg 2x a day, with meals  - NovoLog 4-8 units 2x a day >> Novolog 2-6 units 2x a day 15 min before the meal If the sugars are high between meals, you can correct with NovoLog: >200: + 1 unit >300: + 2 units >400 + 3 units She had previous hypoglycemia with glipizide. She was on repaglinide until we started NovoLog.  Currently on:  - Metformin ER 500 mg 2x a day, with meals - Lantus 8-10 units at night - FiAsp 4-8 units 2-3x a day before the meal Correction scale with Fiasp: >200: + 1 unit >300: + 2 units >400 + 3 units  Pt checks her sugars >4x a  day with the Libre 2 CGM:  Previously:  Previously:   Lowest blood sugars: 54 - pm >> 49; she has hypoglycemia awareness at 70.  Highest sugar was 300s (hand surgery) >> 300s.  Glucometer: Accu-Chek guide  Pt's meals - 2x a day.  - no CKD, BUN/creatinine: With 06/05/2023: Creatinine 0.66, GFR 60 Lab Results  Component Value Date   BUN 11 04/27/2023   BUN 11 04/27/2023   CREATININE 0.80 04/27/2023   CREATININE 0.80 04/27/2023  No results found for: "MICRALBCREAT" She is not on ACE inhibitor/ARB.  -+ HL; last set of lipids: Lab Results  Component Value Date   CHOL 121 04/21/2023   HDL 64 04/21/2023   LDLCALC 38 04/21/2023   TRIG 97 04/21/2023   CHOLHDL 1.9 04/21/2023  On Mevacor 10 mg daily  - last eye exam was in 03/2022. No DR reportedly. + Glaucoma. Dr. Joseph Art.  - + numbness and tingling in her feet.  Last foot exam 08/17/2022.  On Gabapentin.  She has a history of HTN, overactive bladder, depression/anxiety, anemia-on iron 325 mg daily. She had Whipple procedure 10/2019 >> started to lose weight then: from 151.  ROS: + see HPI  Past Medical History:  Diagnosis Date   Bronchitis    Bulging disc    in neck   Depression with anxiety    Diabetes mellitus    Type II  Fibromyalgia    pt. denies   GERD (gastroesophageal reflux disease)    Glaucoma    History of bronchitis    Hyperlipidemia    Hypertension    Overactive bladder    Peripheral vascular disease (HCC)    Pneumonia    Sleep apnea    recent test negative for sleep apnea   Tobacco abuse    Urinary frequency    Past Surgical History:  Procedure Laterality Date   ABDOMINAL AORTAGRAM N/A 08/15/2013   Procedure: ABDOMINAL Ronny Flurry;  Surgeon: Sherren Kerns, MD;  Location: [facility] CATH LAB;  Service: Cardiovascular;  Laterality: N/A;   ABDOMINAL AORTAGRAM N/A 06/19/2014   Procedure: ABDOMINAL Ronny Flurry;  Surgeon: Sherren Kerns, MD;  Location: [facility] CATH LAB;  Service: Cardiovascular;  Laterality: N/A;    ABDOMINAL AORTOGRAM W/LOWER EXTREMITY N/A 05/10/2018   Procedure: ABDOMINAL AORTOGRAM W/LOWER EXTREMITY;  Surgeon: Sherren Kerns, MD;  Location: [facility] INVASIVE CV LAB;  Service: Cardiovascular;  Laterality: N/A;  bilateral   ABDOMINAL AORTOGRAM W/LOWER EXTREMITY N/A 04/20/2023   Procedure: ABDOMINAL AORTOGRAM W/LOWER EXTREMITY;  Surgeon: Leonie Douglas, MD;  Location: [facility] INVASIVE CV LAB;  Service: Cardiovascular;  Laterality: N/A;   Aortogram w/ PTA  05/14/08,  11-04-10   Bilateral aortogram w/ bilateral  SFA PTA  stenting    BREAST SURGERY Right    boil removal   BUNIONECTOMY     L foot in the 1980s   COLONOSCOPY     2014   DILATION AND CURETTAGE OF UTERUS     X4   ENDARTERECTOMY FEMORAL Right 06/03/2018   Procedure: RIGHT FEMORAL ENDARTERECTOMY;  Surgeon: Sherren Kerns, MD;  Location: [facility] OR;  Service: Vascular;  Laterality: Right;   Epidural shots in neck      ESOPHAGOGASTRODUODENOSCOPY (EGD) WITH PROPOFOL N/A 09/15/2019   Procedure: ESOPHAGOGASTRODUODENOSCOPY (EGD) WITH PROPOFOL;  Surgeon: Willis Modena, MD;  Location: [facility] ENDOSCOPY;  Service: Endoscopy;  Laterality: N/A;   EUS N/A 09/15/2019   Procedure: UPPER ENDOSCOPIC ULTRASOUND (EUS) LINEAR;  Surgeon: Willis Modena, MD;  Location: [facility] ENDOSCOPY;  Service: Endoscopy;  Laterality: N/A;   EYE SURGERY Bilateral 2016   cataract removal   FEMORAL-POPLITEAL BYPASS GRAFT Left 10/21/2013   Procedure: LEFT FEMORAL-POPLITEAL ARTERY BYPASS WITH SAPHENOUS VEIN GRAFT , POPLITEAL ENDARTERECTOMY ,INTRAOPERATIVE ARTERIOGRAM, vein patch angioplasty to popliteal artery;  Surgeon: Sherren Kerns, MD;  Location: [facility] OR;  Service: Vascular;  Laterality: Left;   FEMORAL-POPLITEAL BYPASS GRAFT Right 02/08/2016   Procedure: Right FEMORAL- to Above Knee POPLITEAL ARTERY Bypass Graft with reversed saphenous vein and Common Femoral Endarterectomy  with profundoplasty;  Surgeon: Sherren Kerns, MD;  Location: [facility] OR;  Service: Vascular;  Laterality:  Right;   FINE NEEDLE ASPIRATION N/A 09/15/2019   Procedure: FINE NEEDLE ASPIRATION (FNA) LINEAR;  Surgeon: Willis Modena, MD;  Location: [facility] ENDOSCOPY;  Service: Endoscopy;  Laterality: N/A;   GROIN DEBRIDEMENT Left 10/28/2013   Procedure: left inner thigh DEBRIDEMENT;  Surgeon: Sherren Kerns, MD;  Location: [facility] OR;  Service: Vascular;  Laterality: Left;   HAND SURGERY Left 2010   LOWER EXTREMITY ANGIOGRAM Bilateral 12/24/2015   Procedure: Lower Extremity Angiogram;  Surgeon: Sherren Kerns, MD;  Location: [facility] INVASIVE CV LAB;  Service: Cardiovascular;  Laterality: Bilateral;   PATCH ANGIOPLASTY Right 06/03/2018   Procedure: PATCH ANGIOPLASTY USING A XENOSURE 1CM X 14CM BIOLOGIC PATCH;  Surgeon: Sherren Kerns, MD;  Location: [facility] OR;  Service: Vascular;  Laterality: Right;   PERIPHERAL VASCULAR CATHETERIZATION  N/A 12/24/2015   Procedure: Abdominal Aortogram;  Surgeon: Sherren Kerns, MD;  Location: [facility] INVASIVE CV LAB;  Service: Cardiovascular;  Laterality: N/A;   PERIPHERAL VASCULAR CATHETERIZATION Left 12/24/2015   Procedure: Peripheral Vascular Balloon Angioplasty;  Surgeon: Sherren Kerns, MD;  Location: [facility] INVASIVE CV LAB;  Service: Cardiovascular;  Laterality: Left;  drug coated balloon   PERIPHERAL VASCULAR CATHETERIZATION N/A 06/30/2016   Procedure: Abdominal Aortogram;  Surgeon: Sherren Kerns, MD;  Location: [facility] INVASIVE CV LAB;  Service: Cardiovascular;  Laterality: N/A;   PERIPHERAL VASCULAR CATHETERIZATION Bilateral 06/30/2016   Procedure: Lower Extremity Angiography;  Surgeon: Sherren Kerns, MD;  Location[facility] INVASIVE CV LAB;  Service: Cardiovascular;  Laterality: Bilateral;   TONSILLECTOMY     Social History   Socioeconomic History   Marital status: Single    Spouse name: Not on file   Number of children: 1   Years of education: Not on file   Highest education level: Not on file  Occupational History   Occupation: Retired    Associate Professor: Scientist, water quality  Tobacco Use   Smoking  status: Former    Current packs/day: 0.00    Types: Cigarettes    Quit date: 10/2014    Years since quitting: 8.8    Passive exposure: Never   Smokeless tobacco: Never   Tobacco comments:    pt states that she is using the E cig only   Vaping Use   Vaping status: Some Days  Substance and Sexual Activity   Alcohol use: Yes    Alcohol/week: 0.0 - 1.0 standard drinks of alcohol    Comment: Occasional; 10-22 rarely    Drug use: No   Sexual activity: Not on file  Other Topics Concern   Not on file  Social History Narrative   Drinks about 2 cups of coffee a day, and some tea    Social Determinants of Health   Financial Resource Strain: Not on file  Food Insecurity: No Food Insecurity (04/20/2023)   Hunger Vital Sign    Worried About Running Out of Food in the Last Year: Never true    Ran Out of Food in the Last Year: Never true  Transportation Needs: No Transportation Needs (04/20/2023)   PRAPARE - Administrator, Civil Service (Medical): No    Lack of Transportation (Non-Medical): No  Physical Activity: Not on file  Stress: Not on file  Social Connections: Not on file  Intimate Partner Violence: Not At Risk (04/20/2023)   Humiliation, Afraid, Rape, and Kick questionnaire    Fear of Current or Ex-Partner: No    Emotionally Abused: No    Physically Abused: No    Sexually Abused: No   Current Outpatient Medications on File Prior to Visit  Medication Sig Dispense Refill   amLODipine (NORVASC) 5 MG tablet Take 5 mg by mouth daily.     aspirin EC 81 MG tablet Take 81 mg by mouth at bedtime.     atorvastatin (LIPITOR) 80 MG tablet Take 1 tablet (80 mg total) by mouth daily. 30 tablet 11   clopidogrel (PLAVIX) 75 MG tablet Take 1 tablet (75 mg total) by mouth daily with breakfast. 30 tablet 3   Continuous Blood Gluc Sensor (FREESTYLE LIBRE 2 SENSOR) MISC 1 Device by Does not apply route every 14 (fourteen) days. 6 each 3   diclofenac Sodium (VOLTAREN) 1 % GEL Apply 1  application. topically daily as needed (leg pain).     diphenhydrAMINE (BENADRYL) 25 MG  tablet Take 25 mg by mouth daily as needed for allergies.     diphenhydramine-acetaminophen (TYLENOL PM) 25-500 MG TABS tablet Take 1 tablet by mouth at bedtime as needed (sleep).     DULoxetine (CYMBALTA) 60 MG capsule Take 60 mg by mouth daily.     ferrous sulfate 325 (65 FE) MG EC tablet Take 325 mg by mouth daily.     gabapentin (NEURONTIN) 300 MG capsule Take 600 mg by mouth 3 (three) times daily.     ibuprofen (ADVIL) 200 MG tablet Take 200 mg by mouth every 6 (six) hours as needed for moderate pain.     insulin aspart (FIASP FLEXTOUCH) 100 UNIT/ML FlexTouch Pen Inject 4-8 units under skin before meals, 2-3x a day 15 mL 3   insulin aspart (NOVOLOG FLEXPEN) 100 UNIT/ML FlexPen 4-8 units with every meal 15 mL 11   insulin glargine (LANTUS SOLOSTAR) 100 UNIT/ML Solostar Pen Inject 8-10 Units into the skin at bedtime. 15 mL 3   Insulin Pen Needle (BD PEN NEEDLE NANO 2ND GEN) 32G X 4 MM MISC Use 3x daily 300 each 3   latanoprost (XALATAN) 0.005 % ophthalmic solution Place 1 drop into both eyes at bedtime.     lipase/protease/amylase 24000-76000 units CPEP Take 1 capsule (24,000 Units total) by mouth 3 (three) times daily before meals. (Patient taking differently: Take 24,000-48,000 Units by mouth See admin instructions. Take 2 tablet before meals and 1 tablet after meals and 1 tablet with a snack) 270 capsule 0   losartan (COZAAR) 25 MG tablet Take 25 mg by mouth daily.     Magnesium 250 MG TABS Take 250 mg by mouth daily.     Menthol-Methyl Salicylate (SALONPAS PAIN RELIEF PATCH) PTCH Apply 1 patch topically daily as needed (pain).     metFORMIN (GLUCOPHAGE-XR) 500 MG 24 hr tablet Take 1 tablet (500 mg total) by mouth 2 (two) times daily with a meal. 180 tablet 2   metoprolol succinate (TOPROL-XL) 100 MG 24 hr tablet Take 100 mg by mouth daily. Take with or immediately following a meal.     Multiple  Vitamins-Minerals (CENTRUM SILVER 50+WOMEN) TABS Take 1 tablet by mouth daily.     OVER THE COUNTER MEDICATION Take 1 tablet by mouth daily. Brain health supplement     oxybutynin (DITROPAN) 5 MG tablet Take 5 mg by mouth 2 (two) times daily.     oxyCODONE-acetaminophen (PERCOCET/ROXICET) 5-325 MG tablet Take 1 tablet by mouth every 6 (six) hours as needed for severe pain. 6 tablet 0   pantoprazole (PROTONIX) 40 MG tablet Take 40 mg by mouth 2 (two) times daily.     polyethylene glycol (MIRALAX / GLYCOLAX) 17 g packet Take 17 g by mouth daily as needed. 14 each 0   temazepam (RESTORIL) 30 MG capsule Take 30 mg by mouth at bedtime as needed for sleep.  0   timolol (TIMOPTIC) 0.5 % ophthalmic solution Place 1 drop into both eyes every morning.     traMADol (ULTRAM) 50 MG tablet Take 50 mg by mouth every 8 (eight) hours as needed for moderate pain.     traZODone (DESYREL) 50 MG tablet Take 75 mg by mouth at bedtime as needed for sleep.     No current facility-administered medications on file prior to visit.   Allergies  Allergen Reactions   Wound Dressing Adhesive Other (See Comments) and Rash    Pt states that tape and electrodes leave red "scars" on her skin and her skin  is very sensitive.  Paper tape ok to use    Family History  Problem Relation Age of Onset   Heart disease Father    Heart attack Father        MI at age 54   Hypertension Mother    Alzheimer's disease Mother    Hypertension Sister    Diabetes Brother    Hyperlipidemia Brother    Hypertension Brother    Heart disease Brother    Heart attack Brother    Breast cancer Maternal Aunt    PE: There were no vitals taken for this visit. Wt Readings from Last 3 Encounters:  05/22/23 117 lb (53.1 kg)  05/10/23 118 lb 9.6 oz (53.8 kg)  04/27/23 120 lb (54.4 kg)   Constitutional: thin, in NAD Eyes: EOMI, no exophthalmos ENT: no thyromegaly, no cervical lymphadenopathy Cardiovascular: RRR, No MRG Respiratory: CTA  B Musculoskeletal: no deformities Skin:  no rashes Neurological: no tremor with outstretched hands  ASSESSMENT: 1. DM2, insulin-dependent, controlled, with complications - CAD - PAD - s/p stents  Component     Latest Ref Rng 04/20/2022  Glucose     65 - 99 mg/dL 161 (H)   C-Peptide     0.80 - 3.85 ng/mL 1.50     2.  Hyperlipidemia  PLAN:  1. Patient with longstanding, uncontrolled, type 2 diabetes, on oral antidiabetic regimen with metformin and also basal/bolus insulin regimen, with still poor control.  At last visit, HbA1c was higher, at 7.3%.  Sugars were mostly fluctuating around the upper limit of the target range with higher spikes after meals.  I suggested adding basal insulin starting at a low dose and increasing as tolerated.  I also advised her to adjust the dose of mealtime insulin.  I suggested to switch from NovoLog to [place]. -She contacted [place] last month with high blood sugars after steroid injection.  Upon questioning, at that time, she did not decrease the Fiasp dose and she did not add Lantus.  We recommended to do so. CGM interpretation: -At today's visit, we reviewed her CGM downloads: It appears that *** of values are in target range (goal >70%), while *** are higher than 180 (goal <25%), and *** are lower than 70 (goal <4%).  The calculated average blood sugar is ***.  The projected HbA1c for the next 3 months (GMI) is ***. -Reviewing the CGM trends, ***   - I suggested to:  Patient Instructions  Please continue: - Metformin ER 500 mg 2x a day, with meals - Lantus 8-10 units at night - FiAsp 4-8 units 2-3x a day before the meal Correction scale with Fiasp: >200: + 1 unit >300: + 2 units >400 + 3 units  Please return in 3-4 months.  - we checked her HbA1c: 7%  - advised to check sugars at different times of the day - 4x a day, rotating check times - advised for yearly eye exams >> she is UTD - return to clinic in 3-4 months  2. HL -Reviewed latest lipid  panel from 10/2022: All fractions at goal -she continues on lovastatin 20 mg daily without side effects  Carlus Pavlov, MD PhD [facility] Endocrinology

## 2023-09-13 ENCOUNTER — Other Ambulatory Visit: Payer: Self-pay | Admitting: *Deleted

## 2023-09-13 DIAGNOSIS — I739 Peripheral vascular disease, unspecified: Secondary | ICD-10-CM

## 2023-09-13 DIAGNOSIS — Z95828 Presence of other vascular implants and grafts: Secondary | ICD-10-CM

## 2023-09-14 ENCOUNTER — Ambulatory Visit: Payer: Medicare HMO | Admitting: Internal Medicine

## 2023-09-21 ENCOUNTER — Ambulatory Visit (INDEPENDENT_AMBULATORY_CARE_PROVIDER_SITE_OTHER)
Admission: RE | Admit: 2023-09-21 | Discharge: 2023-09-21 | Disposition: A | Discharge status: Home / Self Care | Payer: Medicare HMO | Source: Ambulatory Visit | Attending: Vascular Surgery | Admitting: Vascular Surgery

## 2023-09-21 ENCOUNTER — Ambulatory Visit (HOSPITAL_COMMUNITY)
Admission: RE | Admit: 2023-09-21 | Discharge: 2023-09-21 | Disposition: A | Discharge status: Home / Self Care | Payer: Medicare HMO | Source: Ambulatory Visit | Attending: Vascular Surgery

## 2023-09-21 DIAGNOSIS — I739 Peripheral vascular disease, unspecified: Secondary | ICD-10-CM

## 2023-09-21 DIAGNOSIS — Z95828 Presence of other vascular implants and grafts: Secondary | ICD-10-CM

## 2023-09-21 LAB — VAS US ABI WITH/WO TBI
Left ABI: 1.06
Right ABI: 0.5

## 2023-09-24 ENCOUNTER — Telehealth: Payer: Self-pay | Admitting: Internal Medicine

## 2023-09-24 DIAGNOSIS — E1151 Type 2 diabetes mellitus with diabetic peripheral angiopathy without gangrene: Secondary | ICD-10-CM

## 2023-09-24 MED ORDER — FREESTYLE LIBRE 3 PLUS SENSOR MISC: 1.0000 | 3 refills | Status: DC

## 2023-09-24 MED ORDER — FREESTYLE LIBRE 2 SENSOR MISC: 1.0000 | 1 refills | Status: DC

## 2023-09-24 MED ORDER — FREESTYLE LIBRE 3 READER DEVI: 0 refills | Status: AC

## 2023-09-24 NOTE — Progress Notes (Unsigned)
[facility] [place]  ASSESSMENT / PLAN: Jill Higgins is a 77 y.o. female with complex personal vascular history with bilateral lower extremity intervention.  Recent angiography performed to evaluate right lower extremity possible inflow stenosis unrevealing but was complicated by flow-limiting arterial dissection.  This was managed intraoperatively with covered stenting 04/20/23.  Recommend:  Abstinence from all tobacco products. Blood glucose control with goal A1c < 7%. Blood pressure control with goal blood pressure < 140/90 mmHg. Lipid reduction therapy with goal LDL-C <100 mg/dL. Aspirin 81mg  PO QD.  Clopidogrel 75mg  PO QD. Atorvastatin 40-80mg  PO QD (or other "high intensity" statin therapy).  No evidence of flow-limiting stenosis to bilateral lower extremities.  Reviewed intraoperative angiogram findings with her.  I think her lower extremity symptoms are likely musculoskeletal in origin.  I will see her back in 1 year with repeat ankle-brachial index.  CHIEF COMPLAINT: follow up angiogram  HISTORY OF PRESENT ILLNESS: Jill Higgins is a 77 y.o. female with complex past vascular history who returns to clinic after angiogram done for right lower extremity pain.  Preangiogram noninvasive testing suggested possible right external iliac artery stenosis and possible left leg bypass stenosis.  Neither was identified on angiogram.  Her angiogram was complicated by her device malfunction which was successfully managed with covered stenting.  The patient is doing reasonably well on my evaluation.  She continues to have right hip and knee pain.  She recently sustained a fall and had some x-rays made.  The pain is not typical of claudication.  She describes point tenderness in her hip.  I do not think she walks fast enough to claudicate.  She does not have any rest pain in her foot.  She has no ulcers.  VASCULAR SURGICAL HISTORY:  Left fem-popliteal vein bypass in December  of 2014 by Dr. Darrick Penna, and right femoral to above knee popliteal vein bypass in March of 2017 by Dr. Darrick Penna. She also had to latera have a redo right femoral endarterectomy in July of 2019 by Dr. Darrick Penna due to disabling claudication.    Past Medical History:  Diagnosis Date   Bronchitis    Bulging disc    in neck   Depression with anxiety    Diabetes mellitus    Type II   Fibromyalgia    pt. denies   GERD (gastroesophageal reflux disease)    Glaucoma    History of bronchitis    Hyperlipidemia    Hypertension    Overactive bladder    Peripheral vascular disease (HCC)    Pneumonia    Sleep apnea    recent test negative for sleep apnea   Tobacco abuse    Urinary frequency     Past Surgical History:  Procedure Laterality Date   ABDOMINAL AORTAGRAM N/A 08/15/2013   Procedure: ABDOMINAL Ronny Flurry;  Surgeon: Sherren Kerns, MD;  Location: [facility] CATH LAB;  Service: Cardiovascular;  Laterality: N/A;   ABDOMINAL AORTAGRAM N/A 06/19/2014   Procedure: ABDOMINAL Ronny Flurry;  Surgeon: Sherren Kerns, MD;  Location: [facility] CATH LAB;  Service: Cardiovascular;  Laterality: N/A;   ABDOMINAL AORTOGRAM W/LOWER EXTREMITY N/A 05/10/2018   Procedure: ABDOMINAL AORTOGRAM W/LOWER EXTREMITY;  Surgeon: Sherren Kerns, MD;  Location: [facility] INVASIVE CV LAB;  Service: Cardiovascular;  Laterality: N/A;  bilateral   ABDOMINAL AORTOGRAM W/LOWER EXTREMITY N/A 04/20/2023   Procedure: ABDOMINAL AORTOGRAM W/LOWER EXTREMITY;  Surgeon: Leonie Douglas, MD;  Location: [facility] INVASIVE CV LAB;  Service: Cardiovascular;  Laterality: N/A;  Aortogram w/ PTA  05/14/08,  11-04-10   Bilateral aortogram w/ bilateral  SFA PTA  stenting    BREAST SURGERY Right    boil removal   BUNIONECTOMY     L foot in the 1980s   COLONOSCOPY     2014   DILATION AND CURETTAGE OF UTERUS     X4   ENDARTERECTOMY FEMORAL Right 06/03/2018   Procedure: RIGHT FEMORAL ENDARTERECTOMY;  Surgeon: Sherren Kerns, MD;  Location: [facility] OR;  Service: Vascular;   Laterality: Right;   Epidural shots in neck      ESOPHAGOGASTRODUODENOSCOPY (EGD) WITH PROPOFOL N/A 09/15/2019   Procedure: ESOPHAGOGASTRODUODENOSCOPY (EGD) WITH PROPOFOL;  Surgeon: Willis Modena, MD;  Location: [facility] ENDOSCOPY;  Service: Endoscopy;  Laterality: N/A;   EUS N/A 09/15/2019   Procedure: UPPER ENDOSCOPIC ULTRASOUND (EUS) LINEAR;  Surgeon: Willis Modena, MD;  Location: [facility] ENDOSCOPY;  Service: Endoscopy;  Laterality: N/A;   EYE SURGERY Bilateral 2016   cataract removal   FEMORAL-POPLITEAL BYPASS GRAFT Left 10/21/2013   Procedure: LEFT FEMORAL-POPLITEAL ARTERY BYPASS WITH SAPHENOUS VEIN GRAFT , POPLITEAL ENDARTERECTOMY ,INTRAOPERATIVE ARTERIOGRAM, vein patch angioplasty to popliteal artery;  Surgeon: Sherren Kerns, MD;  Location: [facility] OR;  Service: Vascular;  Laterality: Left;   FEMORAL-POPLITEAL BYPASS GRAFT Right 02/08/2016   Procedure: Right FEMORAL- to Above Knee POPLITEAL ARTERY Bypass Graft with reversed saphenous vein and Common Femoral Endarterectomy  with profundoplasty;  Surgeon: Sherren Kerns, MD;  Location: [facility] OR;  Service: Vascular;  Laterality: Right;   FINE NEEDLE ASPIRATION N/A 09/15/2019   Procedure: FINE NEEDLE ASPIRATION (FNA) LINEAR;  Surgeon: Willis Modena, MD;  Location: [facility] ENDOSCOPY;  Service: Endoscopy;  Laterality: N/A;   GROIN DEBRIDEMENT Left 10/28/2013   Procedure: left inner thigh DEBRIDEMENT;  Surgeon: Sherren Kerns, MD;  Location: [facility] OR;  Service: Vascular;  Laterality: Left;   HAND SURGERY Left 2010   LOWER EXTREMITY ANGIOGRAM Bilateral 12/24/2015   Procedure: Lower Extremity Angiogram;  Surgeon: Sherren Kerns, MD;  Location: [facility] INVASIVE CV LAB;  Service: Cardiovascular;  Laterality: Bilateral;   PATCH ANGIOPLASTY Right 06/03/2018   Procedure: PATCH ANGIOPLASTY USING A XENOSURE 1CM X 14CM BIOLOGIC PATCH;  Surgeon: Sherren Kerns, MD;  Location: [facility] OR;  Service: Vascular;  Laterality: Right;   PERIPHERAL VASCULAR CATHETERIZATION N/A 12/24/2015    Procedure: Abdominal Aortogram;  Surgeon: Sherren Kerns, MD;  Location: [facility] INVASIVE CV LAB;  Service: Cardiovascular;  Laterality: N/A;   PERIPHERAL VASCULAR CATHETERIZATION Left 12/24/2015   Procedure: Peripheral Vascular Balloon Angioplasty;  Surgeon: Sherren Kerns, MD;  Location: [facility] INVASIVE CV LAB;  Service: Cardiovascular;  Laterality: Left;  drug coated balloon   PERIPHERAL VASCULAR CATHETERIZATION N/A 06/30/2016   Procedure: Abdominal Aortogram;  Surgeon: Sherren Kerns, MD;  Location: [facility] INVASIVE CV LAB;  Service: Cardiovascular;  Laterality: N/A;   PERIPHERAL VASCULAR CATHETERIZATION Bilateral 06/30/2016   Procedure: Lower Extremity Angiography;  Surgeon: Sherren Kerns, MD;  Location: [facility] INVASIVE CV LAB;  Service: Cardiovascular;  Laterality: Bilateral;   TONSILLECTOMY      Family History  Problem Relation Age of Onset   Heart disease Father    Heart attack Father        MI at age 63   Hypertension Mother    Alzheimer's disease Mother    Hypertension Sister    Diabetes Brother    Hyperlipidemia Brother    Hypertension Brother    Heart disease Brother    Heart attack Brother  Breast cancer Maternal Aunt     Social History   Socioeconomic History   Marital status: Single    Spouse name: Not on file   Number of children: 1   Years of education: Not on file   Highest education level: Not on file  Occupational History   Occupation: Retired    Associate Professor: Scientist, water quality  Tobacco Use   Smoking status: Former    Current packs/day: 0.00    Types: Cigarettes    Quit date: 10/2014    Years since quitting: 8.9    Passive exposure: Never   Smokeless tobacco: Never   Tobacco comments:    pt states that she is using the E cig only   Vaping Use   Vaping status: Some Days  Substance and Sexual Activity   Alcohol use: Yes    Alcohol/week: 0.0 - 1.0 standard drinks of alcohol    Comment: Occasional; 10-22 rarely    Drug use: No   Sexual activity: Not on file  Other Topics  Concern   Not on file  Social History Narrative   Drinks about 2 cups of coffee a day, and some tea    Social Determinants of Health   Financial Resource Strain: Not on file  Food Insecurity: No Food Insecurity (04/20/2023)   Hunger Vital Sign    Worried About Running Out of Food in the Last Year: Never true    Ran Out of Food in the Last Year: Never true  Transportation Needs: No Transportation Needs (04/20/2023)   PRAPARE - Administrator, Civil Service (Medical): No    Lack of Transportation (Non-Medical): No  Physical Activity: Not on file  Stress: Not on file  Social Connections: Not on file  Intimate Partner Violence: Not At Risk (04/20/2023)   Humiliation, Afraid, Rape, and Kick questionnaire    Fear of Current or Ex-Partner: No    Emotionally Abused: No    Physically Abused: No    Sexually Abused: No    Allergies  Allergen Reactions   Wound Dressing Adhesive Other (See Comments) and Rash    Pt states that tape and electrodes leave red "scars" on her skin and her skin is very sensitive.  Paper tape ok to use     Current Outpatient Medications  Medication Sig Dispense Refill   amLODipine (NORVASC) 5 MG tablet Take 5 mg by mouth daily.     aspirin EC 81 MG tablet Take 81 mg by mouth at bedtime.     atorvastatin (LIPITOR) 80 MG tablet Take 1 tablet (80 mg total) by mouth daily. 30 tablet 11   clopidogrel (PLAVIX) 75 MG tablet Take 1 tablet (75 mg total) by mouth daily with breakfast. 30 tablet 3   Continuous Glucose Receiver (FREESTYLE LIBRE 3 READER) DEVI Use to monitor glucose continuously. 1 each 0   Continuous Glucose Sensor (FREESTYLE LIBRE 3 PLUS SENSOR) MISC Inject 1 Device into the skin continuous. Change every 15 days 6 each 3   diclofenac Sodium (VOLTAREN) 1 % GEL Apply 1 application. topically daily as needed (leg pain).     diphenhydrAMINE (BENADRYL) 25 MG tablet Take 25 mg by mouth daily as needed for allergies.     diphenhydramine-acetaminophen  (TYLENOL PM) 25-500 MG TABS tablet Take 1 tablet by mouth at bedtime as needed (sleep).     DULoxetine (CYMBALTA) 60 MG capsule Take 60 mg by mouth daily.     ferrous sulfate 325 (65 FE) MG EC tablet Take 325 mg  by mouth daily.     gabapentin (NEURONTIN) 300 MG capsule Take 600 mg by mouth 3 (three) times daily.     ibuprofen (ADVIL) 200 MG tablet Take 200 mg by mouth every 6 (six) hours as needed for moderate pain.     insulin aspart (FIASP FLEXTOUCH) 100 UNIT/ML FlexTouch Pen Inject 4-8 units under skin before meals, 2-3x a day 15 mL 3   insulin aspart (NOVOLOG FLEXPEN) 100 UNIT/ML FlexPen 4-8 units with every meal 15 mL 11   insulin glargine (LANTUS SOLOSTAR) 100 UNIT/ML Solostar Pen Inject 8-10 Units into the skin at bedtime. 15 mL 3   Insulin Pen Needle (BD PEN NEEDLE NANO 2ND GEN) 32G X 4 MM MISC Use 3x daily 300 each 3   latanoprost (XALATAN) 0.005 % ophthalmic solution Place 1 drop into both eyes at bedtime.     lipase/protease/amylase 24000-76000 units CPEP Take 1 capsule (24,000 Units total) by mouth 3 (three) times daily before meals. (Patient taking differently: Take 24,000-48,000 Units by mouth See admin instructions. Take 2 tablet before meals and 1 tablet after meals and 1 tablet with a snack) 270 capsule 0   losartan (COZAAR) 25 MG tablet Take 25 mg by mouth daily.     Magnesium 250 MG TABS Take 250 mg by mouth daily.     Menthol-Methyl Salicylate (SALONPAS PAIN RELIEF PATCH) PTCH Apply 1 patch topically daily as needed (pain).     metFORMIN (GLUCOPHAGE-XR) 500 MG 24 hr tablet Take 1 tablet (500 mg total) by mouth 2 (two) times daily with a meal. 180 tablet 2   metoprolol succinate (TOPROL-XL) 100 MG 24 hr tablet Take 100 mg by mouth daily. Take with or immediately following a meal.     Multiple Vitamins-Minerals (CENTRUM SILVER 50+WOMEN) TABS Take 1 tablet by mouth daily.     OVER THE COUNTER MEDICATION Take 1 tablet by mouth daily. Brain health supplement     oxybutynin (DITROPAN)  5 MG tablet Take 5 mg by mouth 2 (two) times daily.     oxyCODONE-acetaminophen (PERCOCET/ROXICET) 5-325 MG tablet Take 1 tablet by mouth every 6 (six) hours as needed for severe pain. 6 tablet 0   pantoprazole (PROTONIX) 40 MG tablet Take 40 mg by mouth 2 (two) times daily.     polyethylene glycol (MIRALAX / GLYCOLAX) 17 g packet Take 17 g by mouth daily as needed. 14 each 0   temazepam (RESTORIL) 30 MG capsule Take 30 mg by mouth at bedtime as needed for sleep.  0   timolol (TIMOPTIC) 0.5 % ophthalmic solution Place 1 drop into both eyes every morning.     traMADol (ULTRAM) 50 MG tablet Take 50 mg by mouth every 8 (eight) hours as needed for moderate pain.     traZODone (DESYREL) 50 MG tablet Take 75 mg by mouth at bedtime as needed for sleep.     No current facility-administered medications for this visit.    PHYSICAL EXAM There were no vitals filed for this visit.   Thin elderly woman in no acute distress Regular rate and rhythm Unlabored breathing No palpable pedal pulses Feet are warm and well-perfused   PERTINENT LABORATORY AND RADIOLOGIC DATA  Most recent CBC    Latest Ref Rng & Units 04/27/2023    4:14 PM 04/27/2023    3:56 PM 04/20/2023    3:19 PM  CBC  WBC 4.0 - 10.5 K/uL  6.0  6.7   Hemoglobin 12.0 - 15.0 g/dL 29.5  18.8  14.2  Hematocrit 36.0 - 46.0 % 35.0  37.0  43.4   Platelets 150 - 400 K/uL  334  260      Most recent CMP    Latest Ref Rng & Units 04/27/2023    4:14 PM 04/27/2023    3:56 PM 04/20/2023    3:19 PM  CMP  Glucose 70 - 99 mg/dL 50  53  324   BUN 8 - 23 mg/dL 11  11  14    Creatinine 0.44 - 1.00 mg/dL 4.01  0.27  2.53   Sodium 135 - 145 mmol/L 145  142  135   Potassium 3.5 - 5.1 mmol/L 3.6  3.5  4.0   Chloride 98 - 111 mmol/L 108  109  102   CO2 22 - 32 mmol/L  24  22   Calcium 8.9 - 10.3 mg/dL  9.2  8.9   Total Protein 6.5 - 8.1 g/dL   6.9   Total Bilirubin 0.3 - 1.2 mg/dL   0.5   Alkaline Phos 38 - 126 U/L   96   AST 15 - 41 U/L   30   ALT 0  - 44 U/L   18     Renal function CrCl cannot be calculated (Patient's most recent lab result is older than the maximum 21 days allowed.).  Hemoglobin A1C  Date Value  05/10/2023 7.3 % (A)  05/13/2020 8.5    LDL Chol Calc (NIH)  Date Value Ref Range Status  02/08/2022 21 0 - 99 mg/dL Final   LDL Cholesterol  Date Value Ref Range Status  04/21/2023 38 0 - 99 mg/dL Final    Comment:           Total Cholesterol/HDL:CHD Risk Coronary Heart Disease Risk Table                     Men   Women  1/2 Average Risk   3.4   3.3  Average Risk       5.0   4.4  2 X Average Risk   9.6   7.1  3 X Average Risk  23.4   11.0        Use the calculated Patient Ratio above and the CHD Risk Table to determine the patient's CHD Risk.        ATP III CLASSIFICATION (LDL):  <100     mg/dL   Optimal  664-403  mg/dL   Near or Above                    Optimal  130-159  mg/dL   Borderline  474-259  mg/dL   High  >563     mg/dL   Very High Performed at [facility], [address] [place], [place] [address]      Right lower extremity arterial duplex: Patent femoral to popliteal bypass graft with increased velocity at  the distal EIA and inflow artery (50-74% stenosis).   +-------+-----------+-----------+------------+------------+  ABI/TBIToday's ABIToday's TBIPrevious ABIPrevious TBI  +-------+-----------+-----------+------------+------------+  Right 0.95       0.74       1.05        0.88          +-------+-----------+-----------+------------+------------+  Left  0.99       0.61       1.06        0.84          +-------+-----------+-----------+------------+------------+   Rande Brunt. Lenell Antu, MD FACS  [facility] Phone Number: [phone] 09/24/2023 8:08 PM   Total time spent on preparing this encounter including chart review, data review, collecting history, examining the patient, coordinating care for this established patient,  30 minutes.  Portions of this report may have been transcribed using voice recognition software.  Every effort has been made to ensure accuracy; however, inadvertent computerized transcription errors may still be present.

## 2023-09-24 NOTE — Telephone Encounter (Signed)
Requested Prescriptions   Signed Prescriptions Disp Refills   Continuous Glucose Sensor (FREESTYLE LIBRE 2 SENSOR) MISC 6 each 1    Sig: 1 Device by Does not apply route every 14 (fourteen) days.    Authorizing Provider: Carlus Pavlov    Ordering User: Pollie Meyer   Rx sent

## 2023-09-24 NOTE — Telephone Encounter (Signed)
MEDICATION:  FreeStyle Libre 2 Sensor Continuous Blood Gluc Sensor (FREESTYLE LIBRE 2 SENSOR) MISC  PHARMACY:    [id number] - [place], [place] - [address] (Ph: [phone])    HAS THE PATIENT CONTACTED THEIR PHARMACY?  Yes  IS THIS A 90 DAY SUPPLY : Yes  IS PATIENT OUT OF MEDICATION: Yes  IF NOT; HOW MUCH IS LEFT:   LAST APPOINTMENT DATE: @6 /20/2024  NEXT APPOINTMENT DATE:@11 /05/2023  DO WE HAVE YOUR PERMISSION TO LEAVE A DETAILED MESSAGE?:Yes  OTHER COMMENTS:    **Let patient know to contact pharmacy at the end of the day to make sure medication is ready. **  ** Please notify patient to allow 48-72 hours to process**  **Encourage patient to contact the pharmacy for refills or they can request refills through [facility]**

## 2023-09-24 NOTE — Addendum Note (Signed)
Addended by: Pollie Meyer on: 09/24/2023 04:29 PM   Modules accepted: Orders

## 2023-09-24 NOTE — Telephone Encounter (Signed)
Received a vm from [facility] stating they do not have any Libre 2 in stock and to send in [place] 3 plus   Requested Prescriptions   Signed Prescriptions Disp Refills   Continuous Glucose Sensor (FREESTYLE LIBRE 3 PLUS SENSOR) MISC 6 each 3    Sig: Inject 1 Device into the skin continuous. Change every 15 days    Authorizing Provider: Carlus Pavlov    Ordering User: Zeplin Aleshire S   Continuous Glucose Receiver (FREESTYLE LIBRE 3 READER) DEVI 1 each 0    Sig: Use to monitor glucose continuously.    Authorizing Provider: Carlus Pavlov    Ordering User: Pollie Meyer

## 2023-09-25 ENCOUNTER — Encounter: Payer: Self-pay | Admitting: Vascular Surgery

## 2023-09-25 ENCOUNTER — Ambulatory Visit (INDEPENDENT_AMBULATORY_CARE_PROVIDER_SITE_OTHER): Payer: Medicare HMO | Admitting: Vascular Surgery

## 2023-09-25 VITALS — BP 129/72 | HR 68 | Temp 99.0°F | Resp 18 | Ht 63.0 in | Wt 112.0 lb

## 2023-09-25 DIAGNOSIS — I739 Peripheral vascular disease, unspecified: Secondary | ICD-10-CM

## 2023-09-26 ENCOUNTER — Other Ambulatory Visit: Payer: Self-pay

## 2023-09-26 DIAGNOSIS — I739 Peripheral vascular disease, unspecified: Secondary | ICD-10-CM

## 2023-09-27 ENCOUNTER — Other Ambulatory Visit (HOSPITAL_COMMUNITY): Payer: Self-pay

## 2023-09-27 ENCOUNTER — Ambulatory Visit (HOSPITAL_COMMUNITY)
Admission: RE | Admit: 2023-09-27 | Discharge: 2023-09-27 | Disposition: A | Discharge status: Home / Self Care | Payer: Medicare HMO | Source: Ambulatory Visit | Attending: Vascular Surgery | Admitting: Vascular Surgery

## 2023-09-27 ENCOUNTER — Other Ambulatory Visit: Payer: Self-pay

## 2023-09-27 ENCOUNTER — Encounter: Payer: Self-pay | Admitting: Internal Medicine

## 2023-09-27 ENCOUNTER — Ambulatory Visit (INDEPENDENT_AMBULATORY_CARE_PROVIDER_SITE_OTHER): Payer: Medicare HMO | Admitting: Internal Medicine

## 2023-09-27 VITALS — BP 120/76 | HR 70 | Ht 63.0 in | Wt 111.0 lb

## 2023-09-27 DIAGNOSIS — E1151 Type 2 diabetes mellitus with diabetic peripheral angiopathy without gangrene: Secondary | ICD-10-CM | POA: Insufficient documentation

## 2023-09-27 DIAGNOSIS — I701 Atherosclerosis of renal artery: Secondary | ICD-10-CM | POA: Diagnosis not present

## 2023-09-27 DIAGNOSIS — I7773 Dissection of renal artery: Secondary | ICD-10-CM | POA: Diagnosis not present

## 2023-09-27 DIAGNOSIS — E7800 Pure hypercholesterolemia, unspecified: Secondary | ICD-10-CM

## 2023-09-27 DIAGNOSIS — I7771 Dissection of carotid artery: Secondary | ICD-10-CM | POA: Diagnosis not present

## 2023-09-27 DIAGNOSIS — Z794 Long term (current) use of insulin: Secondary | ICD-10-CM

## 2023-09-27 DIAGNOSIS — I70212 Atherosclerosis of native arteries of extremities with intermittent claudication, left leg: Secondary | ICD-10-CM | POA: Diagnosis not present

## 2023-09-27 DIAGNOSIS — I739 Peripheral vascular disease, unspecified: Secondary | ICD-10-CM | POA: Insufficient documentation

## 2023-09-27 LAB — POCT GLYCOSYLATED HEMOGLOBIN (HGB A1C): Hemoglobin A1C: 9.5 % — AB (ref 4.0–5.6)

## 2023-09-27 LAB — POCT I-STAT CREATININE: Creatinine, Ser: 0.7 mg/dL (ref 0.44–1.00)

## 2023-09-27 MED ORDER — IOHEXOL 350 MG/ML SOLN
100.0000 mL | Freq: Once | INTRAVENOUS | Status: AC | PRN
  Administered 2023-09-27: 100 mL via INTRAVENOUS

## 2023-09-27 MED ORDER — LANTUS SOLOSTAR 100 UNIT/ML ~~LOC~~ SOPN: 20.0000 [IU] | PEN_INJECTOR | Freq: Every day | SUBCUTANEOUS | 3 refills | Status: DC

## 2023-09-27 MED ORDER — INSULIN PEN NEEDLE 32G X 4 MM MISC: 3 refills | Status: DC

## 2023-09-27 MED ORDER — FREESTYLE LIBRE 3 PLUS SENSOR MISC: 1.0000 | 3 refills | Status: DC

## 2023-09-27 NOTE — Progress Notes (Signed)
Patient ID: Jill Higgins, female   DOB: 1946/05/14, 77 y.o.   MRN: [id number]  HPI: Jill Higgins is a 77 y.o.-year-old female, returning for follow-up for DM2, dx in 2013, insulin-dependent, uncontrolled, with complications (CAD, PAD). Pt. previously saw Dr. Everardo All, but last visit with me 4.xtrosf  months ago.  Interim history: She has increased urination, but no blurry vision, chest pain. She had weight loss after her Whipple procedure (for what turned out to be a benign tumor) in 2020 and then after hospitalization for pancreatitis 06/2022.  However, before last visit, she gained about 20 pounds after her abdominal pain resolved. Before last visit, she had severe right leg pain and had femoropopliteal bypass.  She is still having problem with the right leg and has a CT scan this evening. She feels that her sugars never improved after steroid inj + po steroids in 05/2023 for knee pain.  The knee pain improved.  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  Reviewed HbA1c: Lab Results  Component Value Date   HGBA1C 7.3 (A) 05/10/2023   HGBA1C 5.6 12/18/2022   HGBA1C 7.0 (A) 08/17/2022   HGBA1C 7.0 (A) 04/20/2022   HGBA1C 6.3 (A) 01/23/2022   HGBA1C 6.8 (A) 10/24/2021   HGBA1C 7.5 (A) 07/18/2021   HGBA1C 7.9 (A) 02/01/2021   HGBA1C 8.1 (A) 12/03/2020   HGBA1C 9.5 (A) 11/02/2020  06/26/2022: HbA1c 8.5%   She was previously on: - Metformin ER 1000 >> 500 mg 2x a day, with meals  - NovoLog 4-8 units 2x a day >> Novolog 2-6 units 2x a day 15 min before the meal If the sugars are high between meals, you can correct with NovoLog: >200: + 1 unit >300: + 2 units >400 + 3 units She had previous hypoglycemia with glipizide. She was on repaglinide until we started NovoLog.  Currently on:  - Metformin ER 500 mg 2x a  day, with meals -  >> she did not start -did not get it from the pharmacy - FiAsp 4-8 units 2-3x a day before the meal Correction scale with Fiasp: >200: + 1 unit >300: + 2 units >400 + 3 units  Pt checks her sugars >4x a day with the Libre 2 CGM:  Previously:  Previously:   Lowest blood sugars: 54 - pm >> 49 >> 70; she has hypoglycemia awareness at 70.  Highest sugar was 300s (hand surgery) >> 300s>> 300s  Glucometer: Accu-Chek guide  Pt's meals - 2x a day.  - no CKD, BUN/creatinine: With 06/05/2023: Creatinine 0.66, GFR 60 Lab Results  Component Value Date   BUN 11 04/27/2023   BUN 11 04/27/2023   CREATININE 0.80 04/27/2023   CREATININE 0.80 04/27/2023  No results found for: "MICRALBCREAT" She is not on ACE inhibitor/ARB.  -+ HL; last set of lipids: Lab Results  Component Value Date   CHOL 121 04/21/2023   HDL 64 04/21/2023   LDLCALC 38 04/21/2023   TRIG 97 04/21/2023   CHOLHDL 1.9 04/21/2023  On Mevacor 10 mg daily  - last eye exam was in 2024. No DR reportedly. + Glaucoma. Dr. Joseph Art.  - + numbness and tingling in her feet.  Last foot exam 08/17/2022.  On Gabapentin.  She has a history of HTN, overactive bladder, depression/anxiety, anemia-on iron 325 mg daily. She had Whipple procedure 10/2019 >>  started to lose weight then: from 151.  ROS: + see HPI  Past Medical History:  Diagnosis Date   Bronchitis    Bulging disc    in neck   Depression with anxiety    Diabetes mellitus    Type II   Fibromyalgia    pt. denies   GERD (gastroesophageal reflux disease)    Glaucoma    History of bronchitis    Hyperlipidemia    Hypertension    Overactive bladder    Peripheral vascular disease (HCC)    Pneumonia    Sleep apnea    recent test negative for sleep apnea   Tobacco abuse    Urinary frequency    Past Surgical History:  Procedure Laterality Date   ABDOMINAL AORTAGRAM N/A 08/15/2013   Procedure: ABDOMINAL Ronny Flurry;  Surgeon: Sherren Kerns, MD;   Location: [facility] CATH LAB;  Service: Cardiovascular;  Laterality: N/A;   ABDOMINAL AORTAGRAM N/A 06/19/2014   Procedure: ABDOMINAL Ronny Flurry;  Surgeon: Sherren Kerns, MD;  Location[facility] C[facility] LAB;  Service: Cardiovascular;  Laterality: N/A;   ABDOMINAL AORTOGRAM W/LOWER EXTREMITY N/A 05/10/2018   Procedure: ABDOMINAL AORTOGRAM W/LOWER EXTREMITY;  Surgeon: Sherren Kerns, MD;  Location: [facility] INVASIVE CV LAB;  Service: Cardiovascular;  Laterality: N/A;  bilateral   ABDOMINAL AORTOGRAM W/LOWER EXTREMITY N/A 04/20/2023   Procedure: ABDOMINAL AORTOGRAM W/LOWER EXTREMITY;  Surgeon: Leonie Douglas, MD;  Location: [facility] INVASIVE CV LAB;  Service: Cardiovascular;  Laterality: N/A;   Aortogram w/ PTA  05/14/08,  11-04-10   Bilateral aortogram w/ bilateral  SFA PTA  stenting    BREAST SURGERY Right    boil removal   BUNIONECTOMY     L foot in the 1980s   COLONOSCOPY     2014   DILATION AND CURETTAGE OF UTERUS     X4   ENDARTERECTOMY FEMORAL Right 06/03/2018   Procedure: RIGHT FEMORAL ENDARTERECTOMY;  Surgeon: Sherren Kerns, MD;  Location: [facility] OR;  Service: Vascular;  Laterality: Right;   Epidural shots in neck      ESOPHAGOGASTRODUODENOSCOPY (EGD) WITH PROPOFOL N/A 09/15/2019   Procedure: ESOPHAGOGASTRODUODENOSCOPY (EGD) WITH PROPOFOL;  Surgeon: Willis Modena, MD;  Location: [facility] ENDOSCOPY;  Service: Endoscopy;  Laterality: N/A;   EUS N/A 09/15/2019   Procedure: UPPER ENDOSCOPIC ULTRASOUND (EUS) LINEAR;  Surgeon: Willis Modena, MD;  Location: [facility] ENDOSCOPY;  Service: Endoscopy;  Laterality: N/A;   EYE SURGERY Bilateral 2016   cataract removal   FEMORAL-POPLITEAL BYPASS GRAFT Left 10/21/2013   Procedure: LEFT FEMORAL-POPLITEAL ARTERY BYPASS WITH SAPHENOUS VEIN GRAFT , POPLITEAL ENDARTERECTOMY ,INTRAOPERATIVE ARTERIOGRAM, vein patch angioplasty to popliteal artery;  Surgeon: Sherren Kerns, MD;  Location: [facility] OR;  Service: Vascular;  Laterality: Left;   FEMORAL-POPLITEAL BYPASS GRAFT Right 02/08/2016    Procedure: Right FEMORAL- to Above Knee POPLITEAL ARTERY Bypass Graft with reversed saphenous vein and Common Femoral Endarterectomy  with profundoplasty;  Surgeon: Sherren Kerns, MD;  Location: [facility] OR;  Service: Vascular;  Laterality: Right;   FINE NEEDLE ASPIRATION N/A 09/15/2019   Procedure: FINE NEEDLE ASPIRATION (FNA) LINEAR;  Surgeon: Willis Modena, MD;  Location: [facility] ENDOSCOPY;  Service: Endoscopy;  Laterality: N/A;   GROIN DEBRIDEMENT Left 10/28/2013   Procedure: left inner thigh DEBRIDEMENT;  Surgeon: Sherren Kerns, MD;  Location: [facility] OR;  Service: Vascular;  Laterality: Left;   HAND SURGERY Left 2010   LOWER EXTREMITY ANGIOGRAM Bilateral 12/24/2015   Procedure: Lower Extremity Angiogram;  Surgeon: Sherren Kerns, MD;  Location: [facility]  INVASIVE CV LAB;  Service: Cardiovascular;  Laterality: Bilateral;   PATCH ANGIOPLASTY Right 06/03/2018   Procedure: PATCH ANGIOPLASTY USING A XENOSURE 1CM X 14CM BIOLOGIC PATCH;  Surgeon: Sherren Kerns, MD;  Location: [facility] OR;  Service: Vascular;  Laterality: Right;   PERIPHERAL VASCULAR CATHETERIZATION N/A 12/24/2015   Procedure: Abdominal Aortogram;  Surgeon: Sherren Kerns, MD;  Location: [facility] INVASIVE CV LAB;  Service: Cardiovascular;  Laterality: N/A;   PERIPHERAL VASCULAR CATHETERIZATION Left 12/24/2015   Procedure: Peripheral Vascular Balloon Angioplasty;  Surgeon: Sherren Kerns, MD;  Location: [facility] INVASIVE CV LAB;  Service: Cardiovascular;  Laterality: Left;  drug coated balloon   PERIPHERAL VASCULAR CATHETERIZATION N/A 06/30/2016   Procedure: Abdominal Aortogram;  Surgeon: Sherren Kerns, MD;  Location: [facility] INVASIVE CV LAB;  Service: Cardiovascular;  Laterality: N/A;   PERIPHERAL VASCULAR CATHETERIZATION Bilateral 06/30/2016   Procedure: Lower Extremity Angiography;  Surgeon: Sherren Kerns, MD;  Location: [facility] INVASIVE CV LAB;  Service: Cardiovascular;  Laterality: Bilateral;   TONSILLECTOMY     Social History   Socioeconomic History   Marital  status: Single    Spouse name: Not on file   Number of children: 1   Years of education: Not on file   Highest education level: Not on file  Occupational History   Occupation: Retired    Associate Professor: Scientist, water quality  Tobacco Use   Smoking status: Former    Current packs/day: 0.25    Average packs/day: 0.3 packs/day for 0.3 years (0.1 ttl pk-yrs)    Types: Cigarettes    Start date: 06/2023    Quit date: 10/2014    Passive exposure: Never   Smokeless tobacco: Never   Tobacco comments:    pt states that she is using the E cig only   Vaping Use   Vaping status: Some Days  Substance and Sexual Activity   Alcohol use: Yes    Alcohol/week: 0.0 - 1.0 standard drinks of alcohol    Comment: Occasional; 10-22 rarely    Drug use: No   Sexual activity: Not on file  Other Topics Concern   Not on file  Social History Narrative   Drinks about 2 cups of coffee a day, and some tea    Social Determinants of Health   Financial Resource Strain: Not on file  Food Insecurity: No Food Insecurity (04/20/2023)   Hunger Vital Sign    Worried About Running Out of Food in the Last Year: Never true    Ran Out of Food in the Last Year: Never true  Transportation Needs: No Transportation Needs (04/20/2023)   PRAPARE - Administrator, Civil Service (Medical): No    Lack of Transportation (Non-Medical): No  Physical Activity: Not on file  Stress: Not on file  Social Connections: Not on file  Intimate Partner Violence: Not At Risk (04/20/2023)   Humiliation, Afraid, Rape, and Kick questionnaire    Fear of Current or Ex-Partner: No    Emotionally Abused: No    Physically Abused: No    Sexually Abused: No   Current Outpatient Medications on File Prior to Visit  Medication Sig Dispense Refill   amLODipine (NORVASC) 5 MG tablet Take 5 mg by mouth daily.     aspirin EC 81 MG tablet Take 81 mg by mouth at bedtime.     atorvastatin (LIPITOR) 80 MG tablet Take 1 tablet (80 mg total) by mouth daily. 30  tablet 11   clopidogrel (PLAVIX) 75 MG tablet Take 1 tablet (  75 mg total) by mouth daily with breakfast. (Patient not taking: Reported on 09/25/2023) 30 tablet 3   Continuous Glucose Receiver (FREESTYLE LIBRE 3 READER) DEVI Use to monitor glucose continuously. 1 each 0   diclofenac Sodium (VOLTAREN) 1 % GEL Apply 1 application. topically daily as needed (leg pain).     diphenhydrAMINE (BENADRYL) 25 MG tablet Take 25 mg by mouth daily as needed for allergies.     diphenhydramine-acetaminophen (TYLENOL PM) 25-500 MG TABS tablet Take 1 tablet by mouth at bedtime as needed (sleep).     DULoxetine (CYMBALTA) 60 MG capsule Take 60 mg by mouth daily.     ferrous sulfate 325 (65 FE) MG EC tablet Take 325 mg by mouth daily.     gabapentin (NEURONTIN) 300 MG capsule Take 600 mg by mouth 3 (three) times daily.     ibuprofen (ADVIL) 200 MG tablet Take 200 mg by mouth every 6 (six) hours as needed for moderate pain.     insulin aspart (FIASP FLEXTOUCH) 100 UNIT/ML FlexTouch Pen Inject 4-8 units under skin before meals, 2-3x a day 15 mL 3   insulin aspart (NOVOLOG FLEXPEN) 100 UNIT/ML FlexPen 4-8 units with every meal 15 mL 11   insulin glargine (LANTUS SOLOSTAR) 100 UNIT/ML Solostar Pen Inject 8-10 Units into the skin at bedtime. 15 mL 3   Insulin Pen Needle (BD PEN NEEDLE NANO 2ND GEN) 32G X 4 MM MISC Use 3x daily 300 each 3   latanoprost (XALATAN) 0.005 % ophthalmic solution Place 1 drop into both eyes at bedtime.     lipase/protease/amylase 24000-76000 units CPEP Take 1 capsule (24,000 Units total) by mouth 3 (three) times daily before meals. (Patient taking differently: Take 24,000-48,000 Units by mouth See admin instructions. Take 2 tablet before meals and 1 tablet after meals and 1 tablet with a snack) 270 capsule 0   losartan (COZAAR) 25 MG tablet Take 25 mg by mouth daily.     Magnesium 250 MG TABS Take 250 mg by mouth daily.     Menthol-Methyl Salicylate (SALONPAS PAIN RELIEF PATCH) PTCH Apply 1 patch  topically daily as needed (pain).     metFORMIN (GLUCOPHAGE-XR) 500 MG 24 hr tablet Take 1 tablet (500 mg total) by mouth 2 (two) times daily with a meal. 180 tablet 2   metoprolol succinate (TOPROL-XL) 100 MG 24 hr tablet Take 100 mg by mouth daily. Take with or immediately following a meal.     Multiple Vitamins-Minerals (CENTRUM SILVER 50+WOMEN) TABS Take 1 tablet by mouth daily.     OVER THE COUNTER MEDICATION Take 1 tablet by mouth daily. Brain health supplement     oxybutynin (DITROPAN) 5 MG tablet Take 5 mg by mouth 2 (two) times daily.     oxyCODONE-acetaminophen (PERCOCET/ROXICET) 5-325 MG tablet Take 1 tablet by mouth every 6 (six) hours as needed for severe pain. 6 tablet 0   pantoprazole (PROTONIX) 40 MG tablet Take 40 mg by mouth 2 (two) times daily.     polyethylene glycol (MIRALAX / GLYCOLAX) 17 g packet Take 17 g by mouth daily as needed. 14 each 0   temazepam (RESTORIL) 30 MG capsule Take 30 mg by mouth at bedtime as needed for sleep.  0   timolol (TIMOPTIC) 0.5 % ophthalmic solution Place 1 drop into both eyes every morning.     traMADol (ULTRAM) 50 MG tablet Take 50 mg by mouth every 8 (eight) hours as needed for moderate pain.     traZODone (DESYREL) 50  MG tablet Take 75 mg by mouth at bedtime as needed for sleep.     No current facility-administered medications on file prior to visit.   Allergies  Allergen Reactions   Wound Dressing Adhesive Other (See Comments) and Rash    Pt states that tape and electrodes leave red "scars" on her skin and her skin is very sensitive.  Paper tape ok to use    Family History  Problem Relation Age of Onset   Heart disease Father    Heart attack Father        MI at age 60   Hypertension Mother    Alzheimer's disease Mother    Hypertension Sister    Diabetes Brother    Hyperlipidemia Brother    Hypertension Brother    Heart disease Brother    Heart attack Brother    Breast cancer Maternal Aunt    PE: BP 120/76 (BP Location: Left  Arm, Patient Position: Sitting, Cuff Size: Small)   Pulse 70   Ht 5\' 3"  (1.6 m)   Wt 111 lb (50.3 kg)   SpO2 98%   BMI 19.66 kg/m  Wt Readings from Last 3 Encounters:  09/27/23 111 lb (50.3 kg)  09/25/23 112 lb (50.8 kg)  05/22/23 117 lb (53.1 kg)   Constitutional: thin, in NAD Eyes: EOMI, no exophthalmos ENT: no thyromegaly, no cervical lymphadenopathy Cardiovascular: RRR, No MRG Respiratory: CTA B Musculoskeletal: no deformities Skin:  no rashes Neurological: no tremor with outstretched hands Diabetic Foot Exam - Simple   Simple Foot Form Diabetic Foot exam was performed with the following findings: Yes 09/27/2023  2:08 PM  Visual Inspection No deformities, no ulcerations, no other skin breakdown bilaterally: Yes Sensation Testing Intact to touch and monofilament testing bilaterally: Yes Pulse Check Posterior Tibialis and Dorsalis pulse intact bilaterally: Yes Comments Thick elongated nails    ASSESSMENT: 1. DM2, insulin-dependent, controlled, with complications - CAD - PAD - s/p stents  Component     Latest Ref Rng 04/20/2022  Glucose     65 - 99 mg/dL 542 (H)   C-Peptide     0.80 - 3.85 ng/mL 1.50     2.  Hyperlipidemia  PLAN:  1. Patient with longstanding, uncontrolled, type 2 diabetes, on oral antidiabetic regimen with metformin and also bolus insulin regimen, with her diabetes control deteriorating earlier this summer.  At last visit, HbA1c was higher, at 7.3%.  Sugars were mostly fluctuating around the upper limit of the target range with higher spikes after meals.  I suggested adding basal insulin starting at a low dose and increasing as tolerated.  I also advised her to adjust the dose of mealtime insulin.  I suggested to switch from NovoLog to [place]. -She contacted [place] last month with high blood sugars after steroid injection.  Upon questioning, at that time, she did not increase the Fiasp dose and she did not add Lantus.  We recommended to do so. CGM  interpretation: -At today's visit, we reviewed her CGM downloads: It appears that 31% of values are in target range (goal >70%), while 69% are higher than 180 (goal <25%), and 0% are lower than 70 (goal <4%).  The calculated average blood sugar is 221.  The projected HbA1c for the next 3 months (GMI) is 8.6%. -Reviewing the CGM trends, sugars appear to be elevated, almost entirely fluctuating above the upper limit of the target range, with higher blood sugars after lunch and then again after dinner.  Upon questioning, she was  not able to start Lantus as suggested last visit that she did not receive it from the pharmacy.  We discussed that she absolutely needs to start basal insulin and I advised her to start at a low dose and titrate it up as needed.  I am not sure if Lantus is covered for her, and we discussed that we could switch to another analog long-acting insulin if needed. - I suggested to:  Patient Instructions  Please continue: - Metformin ER 500 mg 2x a day, with meals - NovoLog 4-8 units 2-3x a day before the meal Correction scale with Fiasp: >200: + 1 unit >300: + 2 units >400 + 3 units  Please add: - Lantus 8 units at night and may increase by 3 units every 3 days until sugars in the morning are <130.  Please return in 1.5 months.  - we checked her HbA1c: 9.5% (much higher) - advised to check sugars at different times of the day - 4x a day, rotating check times - advised for yearly eye exams >> she is UTD -will check an ACR today - return to clinic in 1.5 months  2. HL -Latest lipid panel from 04/2023 was reviewed: All fractions at goal -she continues on lovastatin 20 mg daily without side effects  Carlus Pavlov, MD PhD [facility] Endocrinology

## 2023-09-27 NOTE — Telephone Encounter (Signed)
Libre needs PA

## 2023-09-27 NOTE — Addendum Note (Signed)
Addended by: Lisabeth Pick on: 09/27/2023 02:45 PM   Modules accepted: Orders

## 2023-09-27 NOTE — Patient Instructions (Addendum)
Please continue: - Metformin ER 500 mg 2x a day, with meals - NovoLog 4-8 units 2-3x a day before the meal Correction scale with Fiasp: >200: + 1 unit >300: + 2 units >400 + 3 units  Please add: - Lantus 8 units at night and may increase by 3 units every 3 days until sugars in the morning are <130.  Please return in 1.5 months.

## 2023-09-28 LAB — MICROALBUMIN / CREATININE URINE RATIO
Creatinine,U: 57.6 mg/dL
Microalb Creat Ratio: 1.2 mg/g (ref 0.0–30.0)
Microalb, Ur: <0.7 mg/dL (ref 0.0–1.9)

## 2023-10-03 ENCOUNTER — Ambulatory Visit: Payer: Medicare HMO | Admitting: Vascular Surgery

## 2023-10-08 ENCOUNTER — Ambulatory Visit (INDEPENDENT_AMBULATORY_CARE_PROVIDER_SITE_OTHER): Payer: Self-pay | Admitting: Vascular Surgery

## 2023-10-08 ENCOUNTER — Other Ambulatory Visit: Payer: Self-pay

## 2023-10-08 DIAGNOSIS — I70211 Atherosclerosis of native arteries of extremities with intermittent claudication, right leg: Secondary | ICD-10-CM

## 2023-10-08 NOTE — Progress Notes (Signed)
Discussed recent CT scan with patient. 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 No great options for anatomic reconstruction of REIA occluded stent.  Aortobifemoral bypass risky because of previous whipple and likely RP scarring. Femoral-femoral bypass a poor option because of small iliac arteries and infrainguinal bypasses in both groins.  Will check right upper extremity arterial duplex. Assuming this is normal, plan R axilofemoral bypass in OR as schedule allows.  Rande Brunt. Lenell Antu, MD [facility] Phone Number: [phone] 10/08/2023 12:15 PM

## 2023-10-09 ENCOUNTER — Other Ambulatory Visit: Payer: Self-pay

## 2023-10-09 DIAGNOSIS — I70211 Atherosclerosis of native arteries of extremities with intermittent claudication, right leg: Secondary | ICD-10-CM

## 2023-10-10 ENCOUNTER — Ambulatory Visit (HOSPITAL_COMMUNITY)
Admission: RE | Admit: 2023-10-10 | Discharge: 2023-10-10 | Disposition: A | Discharge status: Home / Self Care | Payer: Medicare HMO | Source: Ambulatory Visit | Attending: Vascular Surgery | Admitting: Vascular Surgery

## 2023-10-10 DIAGNOSIS — I70211 Atherosclerosis of native arteries of extremities with intermittent claudication, right leg: Secondary | ICD-10-CM | POA: Diagnosis not present

## 2023-10-22 NOTE — Progress Notes (Signed)
Surgical Instructions   Your procedure is scheduled on Thursday, December 5th, 2024. Report to [facility] Main Entrance "A" at 5:30 A.M., then check in with the Admitting office. Any questions or running late day of surgery: call [phone]  Questions prior to your surgery date: call [phone], Monday-Friday, 8am-4pm. If you experience any cold or flu symptoms such as cough, fever, chills, shortness of breath, etc. between now and your scheduled surgery, please notify us at the above number.     Remember:  Do not eat or drink after midnight the night before your surgery    Take these medicines the morning of surgery with A SIP OF WATER: Amlodipine (Norvasc) Aspirin 81 mg Atorvastatin (Lipitor) Duloxetine (Cymbalta) Gabapentin (Neurontin) Metoprolol Succinate (Toprol-XL) Oxybutynin (Ditropan) Pantoprazole (Protonix) Timolol Eye drop   May take these medicines IF NEEDED: Tramadol (Ultram)  Per your surgeon's instructions, last dose of Plavix should be November 29th and you should continue taking your Aspirin.      One week prior to surgery, STOP taking any Aleve, Naproxen, Ibuprofen, Motrin, Advil, Goody's, BC's, all herbal medications, fish oil, and non-prescription vitamins.    WHAT DO I DO ABOUT MY DIABETES MEDICATION?   Do not take oral diabetes medicines (pills) the morning of surgery.  Do NOT take Metformin the morning of surgery.   THE NIGHT BEFORE SURGERY, take __10__ units of __Lantus__insulin.       THE MORNING OF SURGERY, if your blood sugar is greater than 220, then take ___2 - 4 __ units of ___Novolog_insulin.  This should be 50% of what you would normally take.   The day of surgery, do not take other diabetes injectables, including Byetta (exenatide), Bydureon (exenatide ER), Victoza (liraglutide), or Trulicity (dulaglutide).    HOW TO MANAGE YOUR DIABETES BEFORE AND AFTER SURGERY  Why is it important to control my blood sugar before and after  surgery? Improving blood sugar levels before and after surgery helps healing and can limit problems. A way of improving blood sugar control is eating a healthy diet by:  Eating less sugar and carbohydrates  Increasing activity/exercise  Talking with your doctor about reaching your blood sugar goals High blood sugars (greater than 180 mg/dL) can raise your risk of infections and slow your recovery, so you will need to focus on controlling your diabetes during the weeks before surgery. Make sure that the doctor who takes care of your diabetes knows about your planned surgery including the date and location.  How do I manage my blood sugar before surgery? Check your blood sugar at least 4 times a day, starting 2 days before surgery, to make sure that the level is not too high or low.  Check your blood sugar the morning of your surgery when you wake up and every 2 hours until you get to the Short Stay unit.  If your blood sugar is less than 70 mg/dL, you will need to treat for low blood sugar: Do not take insulin. Treat a low blood sugar (less than 70 mg/dL) with  cup of clear juice (cranberry or apple), 4 glucose tablets, OR glucose gel. Recheck blood sugar in 15 minutes after treatment (to make sure it is greater than 70 mg/dL). If your blood sugar is not greater than 70 mg/dL on recheck, call [phone] for further instructions. Report your blood sugar to the short stay nurse when you get to Short Stay.  If you are admitted to the hospital after surgery: Your blood sugar will be checked  by the staff and you will probably be given insulin after surgery (instead of oral diabetes medicines) to make sure you have good blood sugar levels. The goal for blood sugar control after surgery is 80-180 mg/dL.                      Do NOT Smoke (Tobacco/Vaping) for 24 hours prior to your procedure.  If you use a CPAP at night, you may bring your mask/headgear for your overnight stay.   You will be  asked to remove any contacts, glasses, piercing's, hearing aid's, dentures/partials prior to surgery. Please bring cases for these items if needed.    Patients discharged the day of surgery will not be allowed to drive home, and someone needs to stay with them for 24 hours.  SURGICAL WAITING ROOM VISITATION Patients may have no more than 2 support people in the waiting area - these visitors may rotate.   Pre-op nurse will coordinate an appropriate time for 1 ADULT support person, who may not rotate, to accompany patient in pre-op.  Children under the age of 78 must have an adult with them who is not the patient and must remain in the main waiting area with an adult.  If the patient needs to stay at the hospital during part of their recovery, the visitor guidelines for inpatient rooms apply.  Please refer to the [facility] website for the visitor guidelines for any additional information.   If you received a COVID test during your pre-op visit  it is requested that you wear a mask when out in public, stay away from anyone that may not be feeling well and notify your surgeon if you develop symptoms. If you have been in contact with anyone that has tested positive in the last 10 days please notify you surgeon.      Pre-operative CHG Bathing Instructions   You can play a key role in reducing the risk of infection after surgery. Your skin needs to be as free of germs as possible. You can reduce the number of germs on your skin by washing with CHG (chlorhexidine gluconate) soap before surgery. CHG is an antiseptic soap that kills germs and continues to kill germs even after washing.   DO NOT use if you have an allergy to chlorhexidine/CHG or antibacterial soaps. If your skin becomes reddened or irritated, stop using the CHG and notify one of our RNs at [phone].              TAKE A SHOWER THE NIGHT BEFORE SURGERY AND THE DAY OF SURGERY    Please keep in mind the following:  DO NOT shave,  including legs and underarms, 48 hours prior to surgery.   You may shave your face before/day of surgery.  Place clean sheets on your bed the night before surgery Use a clean washcloth (not used since being washed) for each shower. DO NOT sleep with pet's night before surgery.  CHG Shower Instructions:  Wash your face and private area with normal soap. If you choose to wash your hair, wash first with your normal shampoo.  After you use shampoo/soap, rinse your hair and body thoroughly to remove shampoo/soap residue.  Turn the water OFF and apply half the bottle of CHG soap to a CLEAN washcloth.  Apply CHG soap ONLY FROM YOUR NECK DOWN TO YOUR TOES (washing for 3-5 minutes)  DO NOT use CHG soap on face, private areas, open wounds, or sores.  Pay special attention to the area where your surgery is being performed.  If you are having back surgery, having someone wash your back for you may be helpful. Wait 2 minutes after CHG soap is applied, then you may rinse off the CHG soap.  Pat dry with a clean towel  Put on clean pajamas    Additional instructions for the day of surgery: DO NOT APPLY any lotions, deodorants, cologne, or perfumes.   Do not wear jewelry or makeup Do not wear nail polish, gel polish, artificial nails, or any other type of covering on natural nails (fingers and toes) Do not bring valuables to the hospital. [facility] is not responsible for valuables/personal belongings. Put on clean/comfortable clothes.  Please brush your teeth.  Ask your nurse before applying any prescription medications to the skin.

## 2023-10-23 ENCOUNTER — Encounter (HOSPITAL_COMMUNITY): Payer: Self-pay

## 2023-10-23 ENCOUNTER — Encounter (HOSPITAL_COMMUNITY)
Admission: RE | Admit: 2023-10-23 | Discharge: 2023-10-23 | Disposition: A | Discharge status: Home / Self Care | Payer: Medicare HMO | Source: Ambulatory Visit | Attending: Vascular Surgery | Admitting: Vascular Surgery

## 2023-10-23 ENCOUNTER — Other Ambulatory Visit: Payer: Self-pay

## 2023-10-23 VITALS — BP 123/66 | HR 57 | Temp 97.4°F | Resp 16 | Ht 63.0 in | Wt 114.0 lb

## 2023-10-23 DIAGNOSIS — Z01812 Encounter for preprocedural laboratory examination: Secondary | ICD-10-CM | POA: Diagnosis not present

## 2023-10-23 DIAGNOSIS — Z01818 Encounter for other preprocedural examination: Secondary | ICD-10-CM

## 2023-10-23 DIAGNOSIS — I739 Peripheral vascular disease, unspecified: Secondary | ICD-10-CM | POA: Diagnosis not present

## 2023-10-23 DIAGNOSIS — I70211 Atherosclerosis of native arteries of extremities with intermittent claudication, right leg: Secondary | ICD-10-CM | POA: Insufficient documentation

## 2023-10-23 LAB — CBC
HCT: 40.1 % (ref 36.0–46.0)
Hemoglobin: 12.9 g/dL (ref 12.0–15.0)
MCH: 31.5 pg (ref 26.0–34.0)
MCHC: 32.2 g/dL (ref 30.0–36.0)
MCV: 97.8 fL (ref 80.0–100.0)
Platelets: 291 10*3/uL (ref 150–400)
RBC: 4.1 MIL/uL (ref 3.87–5.11)
RDW: 13.1 % (ref 11.5–15.5)
WBC: 9.6 10*3/uL (ref 4.0–10.5)
nRBC: 0 % (ref 0.0–0.2)

## 2023-10-23 LAB — COMPREHENSIVE METABOLIC PANEL
ALT: 20 U/L (ref 0–44)
AST: 23 U/L (ref 15–41)
Albumin: 3.1 g/dL — ABNORMAL LOW (ref 3.5–5.0)
Alkaline Phosphatase: 72 U/L (ref 38–126)
Anion gap: 8 (ref 5–15)
BUN: 8 mg/dL (ref 8–23)
CO2: 26 mmol/L (ref 22–32)
Calcium: 8.6 mg/dL — ABNORMAL LOW (ref 8.9–10.3)
Chloride: 106 mmol/L (ref 98–111)
Creatinine, Ser: 0.71 mg/dL (ref 0.44–1.00)
GFR, Estimated: >60 mL/min (ref >60)
Glucose, Bld: 115 mg/dL — ABNORMAL HIGH (ref 70–99)
Potassium: 3.2 mmol/L — ABNORMAL LOW (ref 3.5–5.1)
Sodium: 140 mmol/L (ref 135–145)
Total Bilirubin: 0.4 mg/dL (ref <1.2)
Total Protein: 6.2 g/dL — ABNORMAL LOW (ref 6.5–8.1)

## 2023-10-23 LAB — TYPE AND SCREEN
ABO/RH(D): A POS
Antibody Screen: NEGATIVE

## 2023-10-23 LAB — URINALYSIS, ROUTINE W REFLEX MICROSCOPIC
Bilirubin Urine: NEGATIVE
Glucose, UA: NEGATIVE mg/dL
Hgb urine dipstick: NEGATIVE
Ketones, ur: NEGATIVE mg/dL
Nitrite: NEGATIVE
Protein, ur: NEGATIVE mg/dL
Specific Gravity, Urine: 1.017 (ref 1.005–1.030)
WBC, UA: >50 WBC/hpf (ref 0–5)
pH: 5 (ref 5.0–8.0)

## 2023-10-23 LAB — SURGICAL PCR SCREEN
MRSA, PCR: NEGATIVE
Staphylococcus aureus: NEGATIVE

## 2023-10-23 LAB — PROTIME-INR
INR: 0.9 (ref 0.8–1.2)
Prothrombin Time: 12.6 s (ref 11.4–15.2)

## 2023-10-23 LAB — APTT: aPTT: 27 s (ref 24–36)

## 2023-10-23 LAB — GLUCOSE, CAPILLARY: Glucose-Capillary: 167 mg/dL — ABNORMAL HIGH (ref 70–99)

## 2023-10-23 NOTE — Progress Notes (Signed)
PCP - Sonny Masters Amison,MD Cardiologist - Ripley Fraise  PPM/ICD - Denies Device Orders -  Rep Notified -   Chest x-ray - na EKG - 04/20/23 Stress Test - 12/16/20 ECHO - 12/31/20 Cardiac Cath - denies  Sleep Study - 2017 CPAP - no  Fasting Blood Sugar - 160 Checks Blood Sugar 2-3 times a day  Last dose of GLP1 agonist-  na GLP1 instructions: na  Blood Thinner Instructions:stop Plavix 11/29 per Dr. Lenell Antu Aspirin Instructions:Continue Aspirin per Dr. Lenell Antu  ERAS Protcol -no PRE-SURGERY Ensure or G2-   COVID TEST- na   Anesthesia review: no  Patient denies shortness of breath, fever, cough and chest pain at PAT appointment   All instructions explained to the patient, with a verbal understanding of the material. Patient agrees to go over the instructions while at home for a better understanding. Patient also instructed to wear a mask when out in public prior to surgery. The opportunity to ask questions was provided.

## 2023-10-24 ENCOUNTER — Telehealth: Payer: Self-pay

## 2023-10-24 ENCOUNTER — Other Ambulatory Visit: Payer: Self-pay | Admitting: Vascular Surgery

## 2023-10-24 MED ORDER — NITROFURANTOIN MONOHYD MACRO 100 MG PO CAPS: 100.0000 mg | ORAL_CAPSULE | Freq: Two times a day (BID) | ORAL | 0 refills | Status: AC

## 2023-10-24 NOTE — Telephone Encounter (Signed)
-----   Message from Leonie Douglas sent at 10/24/2023  9:50 AM EST ----- Regarding: RE: 12/5 Sched Worried for bladder infection. Will need to reschedule for next week or week after. Will write her for Abx to her pharmacy. Thanks. Tom ----- Message ----- From: Primitivo Gauze, RN Sent: 10/24/2023   9:49 AM EST To: Leonie Douglas, MD Subject: 12/5 Sched                                     Patient is scheduled for a right axillofemoral bypass graft on tomorrow. I received this message this morning, "patient seen in PAT yesterday afternoon. UA showed large amount of leukocytes,rare bacteria."   Georgianne Fick

## 2023-10-24 NOTE — Telephone Encounter (Signed)
Spoke with patient regarding urinalysis results and a Rx for Macrobid, take 1 tablet by mouth twice daily x 7 days has been sent to her [employer]. Patient agreeable to reschedule surgery for 12/26 with last dose of Plavix on 12/20.  She verbalized understanding to all information provided.

## 2023-11-06 ENCOUNTER — Ambulatory Visit: Payer: Medicare HMO | Admitting: Internal Medicine

## 2023-11-06 NOTE — Progress Notes (Deleted)
Patient ID: Jill Higgins, female   DOB: 1946/03/08, 77 y.o.   MRN: [id number]  HPI: Jill Higgins is a 77 y.o.-year-old female, returning for follow-up for DM2, dx in 2013, insulin-dependent, uncontrolled, with complications (CAD, PAD). Pt. previously saw Dr. Everardo All, but last visit with me 1 mo ago.  months ago.  Interim history: She has increased urination, but no blurry vision, chest pain. She feels that her sugars never improved after steroid inj + po steroids in 05/2023 for knee pain.  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  Reviewed HbA1c: Lab Results  Component Value Date   HGBA1C 9.5 (A) 09/27/2023   HGBA1C 7.3 (A) 05/10/2023   HGBA1C 5.6 12/18/2022   HGBA1C 7.0 (A) 08/17/2022   HGBA1C 7.0 (A) 04/20/2022   HGBA1C 6.3 (A) 01/23/2022   HGBA1C 6.8 (A) 10/24/2021   HGBA1C 7.5 (A) 07/18/2021   HGBA1C 7.9 (A) 02/01/2021   HGBA1C 8.1 (A) 12/03/2020  06/26/2022: HbA1c 8.5%   She was previously on: - Metformin ER 1000 >> 500 mg 2x a day, with meals  - NovoLog 4-8 units 2x a day >> Novolog 2-6 units 2x a day 15 min before the meal If the sugars are high between meals, you can correct with NovoLog: >200: + 1 unit >300: + 2 units >400 + 3 units She had previous hypoglycemia with glipizide. She was on repaglinide until we started NovoLog.  Currently on:  - Metformin ER 500 mg 2x a day, with meals - Lantus 8-10 units at night  - added 09/2023 >> 20 units daily - FiAsp >> Novolog 4-8 units 2-3x a day before the meal Correction scale with Fiasp: >200: + 1 unit >300: + 2 units >400 + 3 units  Pt checks her sugars >4x a day with the Libre 2 CGM:  Previously:  Previously:   Lowest blood sugars: 54 - pm >> 49 >> 70; she has hypoglycemia awareness at 70.  Highest sugar was 300s (hand surgery)  >> 300s>> 300s  Glucometer: Accu-Chek guide  Pt's meals - 2x a day.  - no CKD, BUN/creatinine: Lab Results  Component Value Date   BUN 8 10/23/2023   BUN 11 04/27/2023   CREATININE 0.71 10/23/2023   CREATININE 0.70 09/27/2023   Lab Results  Component Value Date   MICRALBCREAT 1.2 09/27/2023  She is not on ACE inhibitor/ARB.  -+ HL; last set of lipids: Lab Results  Component Value Date   CHOL 121 04/21/2023   HDL 64 04/21/2023   LDLCALC 38 04/21/2023   TRIG 97 04/21/2023   CHOLHDL 1.9 04/21/2023  On Mevacor 10 mg daily  - last eye exam was in 2024. No DR reportedly. + Glaucoma. Dr. Joseph Art.  - + numbness and tingling in her feet.  Last foot exam 09/27/2023.  On Gabapentin.  She has a history of HTN, overactive bladder, depression/anxiety, anemia-on iron 325 mg daily. 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   ROS: + see HPI  Past Medical History:  Diagnosis Date   Bronchitis    Bulging disc    in neck   Depression with anxiety    Diabetes mellitus    Type II   Fibromyalgia    pt. denies   GERD (gastroesophageal  reflux disease)    Glaucoma    History of bronchitis    Hyperlipidemia    Hypertension    Overactive bladder    Peripheral vascular disease (HCC)    Pneumonia    Sleep apnea    recent test negative for sleep apnea   Tobacco abuse    Urinary frequency    Past Surgical History:  Procedure Laterality Date   ABDOMINAL AORTAGRAM N/A 08/15/2013   Procedure: ABDOMINAL Ronny Flurry;  Surgeon: Sherren Kerns, MD;  Location: [facility] CATH LAB;  Service: Cardiovascular;  Laterality: N/A;   ABDOMINAL AORTAGRAM N/A 06/19/2014   Procedure: ABDOMINAL Ronny Flurry;  Surgeon: Sherren Kerns, MD;  Location: [facility] CATH LAB;  Service: Cardiovascular;  Laterality: N/A;   ABDOMINAL AORTOGRAM W/LOWER EXTREMITY N/A 05/10/2018   Procedure: ABDOMINAL AORTOGRAM W/LOWER EXTREMITY;  Surgeon:  Sherren Kerns, MD;  Location: [facility] INVASIVE CV LAB;  Service: Cardiovascular;  Laterality: N/A;  bilateral   ABDOMINAL AORTOGRAM W/LOWER EXTREMITY N/A 04/20/2023   Procedure: ABDOMINAL AORTOGRAM W/LOWER EXTREMITY;  Surgeon: Leonie Douglas, MD;  Location: [facility] INVASIVE CV LAB;  Service: Cardiovascular;  Laterality: N/A;   Aortogram w/ PTA  05/14/08,  11-04-10   Bilateral aortogram w/ bilateral  SFA PTA  stenting    BREAST SURGERY Right    boil removal   BUNIONECTOMY     L foot in the 1980s   COLONOSCOPY     2014   DILATION AND CURETTAGE OF UTERUS     X4   ENDARTERECTOMY FEMORAL Right 06/03/2018   Procedure: RIGHT FEMORAL ENDARTERECTOMY;  Surgeon: Sherren Kerns, MD;  Location: [facility] OR;  Service: Vascular;  Laterality: Right;   Epidural shots in neck      ESOPHAGOGASTRODUODENOSCOPY (EGD) WITH PROPOFOL N/A 09/15/2019   Procedure: ESOPHAGOGASTRODUODENOSCOPY (EGD) WITH PROPOFOL;  Surgeon: Willis Modena, MD;  Location: [facility] ENDOSCOPY;  Service: Endoscopy;  Laterality: N/A;   EUS N/A 09/15/2019   Procedure: UPPER ENDOSCOPIC ULTRASOUND (EUS) LINEAR;  Surgeon: Willis Modena, MD;  Location: [facility] ENDOSCOPY;  Service: Endoscopy;  Laterality: N/A;   EYE SURGERY Bilateral 2016   cataract removal   FEMORAL-POPLITEAL BYPASS GRAFT Left 10/21/2013   Procedure: LEFT FEMORAL-POPLITEAL ARTERY BYPASS WITH SAPHENOUS VEIN GRAFT , POPLITEAL ENDARTERECTOMY ,INTRAOPERATIVE ARTERIOGRAM, vein patch angioplasty to popliteal artery;  Surgeon: Sherren Kerns, MD;  Location: [facility] OR;  Service: Vascular;  Laterality: Left;   FEMORAL-POPLITEAL BYPASS GRAFT Right 02/08/2016   Procedure: Right FEMORAL- to Above Knee POPLITEAL ARTERY Bypass Graft with reversed saphenous vein and Common Femoral Endarterectomy  with profundoplasty;  Surgeon: Sherren Kerns, MD;  Location: [facility] OR;  Service: Vascular;  Laterality: Right;   FINE NEEDLE ASPIRATION N/A 09/15/2019   Procedure: FINE NEEDLE ASPIRATION (FNA) LINEAR;  Surgeon: Willis Modena, MD;  Location: [facility] ENDOSCOPY;  Service: Endoscopy;  Laterality: N/A;   GROIN DEBRIDEMENT Left 10/28/2013   Procedure: left inner thigh DEBRIDEMENT;  Surgeon: Sherren Kerns, MD;  Location: [facility] OR;  Service: Vascular;  Laterality: Left;   HAND SURGERY Left 2010   LOWER EXTREMITY ANGIOGRAM Bilateral 12/24/2015   Procedure: Lower Extremity Angiogram;  Surgeon: Sherren Kerns, MD;  Location: [facility] INVASIVE CV LAB;  Service: Cardiovascular;  Laterality: Bilateral;   PATCH ANGIOPLASTY Right 06/03/2018   Procedure: PATCH ANGIOPLASTY USING A XENOSURE 1CM X 14CM BIOLOGIC PATCH;  Surgeon: Sherren Kerns, MD;  Location: [facility] OR;  Service: Vascular;  Laterality: Right;   PERIPHERAL VASCULAR CATHETERIZATION N/A 12/24/2015   Procedure: Abdominal Aortogram;  Surgeon: Leonette Most  Holland Commons, MD;  Location: [facility] INVASIVE CV LAB;  Service: Cardiovascular;  Laterality: N/A;   PERIPHERAL VASCULAR CATHETERIZATION Left 12/24/2015   Procedure: Peripheral Vascular Balloon Angioplasty;  Surgeon: Sherren Kerns, MD;  Location: [facility] INVASIVE CV LAB;  Service: Cardiovascular;  Laterality: Left;  drug coated balloon   PERIPHERAL VASCULAR CATHETERIZATION N/A 06/30/2016   Procedure: Abdominal Aortogram;  Surgeon: Sherren Kerns, MD;  Location: [facility] INVASIVE CV LAB;  Service: Cardiovascular;  Laterality: N/A;   PERIPHERAL VASCULAR CATHETERIZATION Bilateral 06/30/2016   Procedure: Lower Extremity Angiography;  Surgeon: Sherren Kerns, MD;  Location: [facility] INVASIVE CV LAB;  Service: Cardiovascular;  Laterality: Bilateral;   TONSILLECTOMY     Social History   Socioeconomic History   Marital status: Single    Spouse name: Not on file   Number of children: 1   Years of education: Not on file   Highest education level: Not on file  Occupational History   Occupation: Retired    Associate Professor: Scientist, water quality  Tobacco Use   Smoking status: Former    Current packs/day: 0.25    Average packs/day: 0.3 packs/day for 0.4 years (0.1 ttl pk-yrs)     Types: Cigarettes    Start date: 06/2023    Quit date: 10/2014    Passive exposure: Never   Smokeless tobacco: Never   Tobacco comments:    pt states that she is using the E cig only   Vaping Use   Vaping status: Some Days  Substance and Sexual Activity   Alcohol use: Yes    Alcohol/week: 0.0 - 1.0 standard drinks of alcohol    Comment: Occasional; 10-22 rarely    Drug use: No   Sexual activity: Not on file  Other Topics Concern   Not on file  Social History Narrative   Drinks about 2 cups of coffee a day, and some tea    Social Drivers of Corporate investment banker Strain: Not on file  Food Insecurity: No Food Insecurity (04/20/2023)   Hunger Vital Sign    Worried About Running Out of Food in the Last Year: Never true    Ran Out of Food in the Last Year: Never true  Transportation Needs: No Transportation Needs (04/20/2023)   PRAPARE - Administrator, Civil Service (Medical): No    Lack of Transportation (Non-Medical): No  Physical Activity: Not on file  Stress: Not on file  Social Connections: Not on file  Intimate Partner Violence: Not At Risk (04/20/2023)   Humiliation, Afraid, Rape, and Kick questionnaire    Fear of Current or Ex-Partner: No    Emotionally Abused: No    Physically Abused: No    Sexually Abused: No   Current Outpatient Medications on File Prior to Visit  Medication Sig Dispense Refill   amLODipine (NORVASC) 5 MG tablet Take 5 mg by mouth daily.     aspirin EC 81 MG tablet Take 81 mg by mouth at bedtime.     atorvastatin (LIPITOR) 80 MG tablet Take 1 tablet (80 mg total) by mouth daily. 30 tablet 11   clopidogrel (PLAVIX) 75 MG tablet Take 1 tablet (75 mg total) by mouth daily with breakfast. 30 tablet 3   Continuous Glucose Receiver (FREESTYLE LIBRE 3 READER) DEVI Use to monitor glucose continuously. 1 each 0   Continuous Glucose Sensor (FREESTYLE LIBRE 3 PLUS SENSOR) MISC Inject 1 Device into the skin continuous. Change every 15 days 6  each 3   diclofenac Sodium (VOLTAREN)  1 % GEL Apply 1 application. topically daily as needed (leg pain).     diphenhydramine-acetaminophen (TYLENOL PM) 25-500 MG TABS tablet Take 1 tablet by mouth at bedtime as needed (sleep).     DULoxetine (CYMBALTA) 60 MG capsule Take 60 mg by mouth daily.     ferrous sulfate 325 (65 FE) MG EC tablet Take 325 mg by mouth daily.     gabapentin (NEURONTIN) 600 MG tablet Take 600 mg by mouth 3 (three) times daily.     insulin aspart (NOVOLOG FLEXPEN) 100 UNIT/ML FlexPen 4-8 units with every meal 15 mL 11   insulin glargine (LANTUS SOLOSTAR) 100 UNIT/ML Solostar Pen Inject 20 Units into the skin daily. (Patient taking differently: Inject 20 Units into the skin at bedtime.) 30 mL 3   Insulin Pen Needle 32G X 4 MM MISC Use 4x a day 300 each 3   latanoprost (XALATAN) 0.005 % ophthalmic solution Place 1 drop into both eyes at bedtime.     lipase/protease/amylase 24000-76000 units CPEP Take 1 capsule (24,000 Units total) by mouth 3 (three) times daily before meals. (Patient taking differently: Take 24,000-48,000 Units by mouth See admin instructions. Take 2 tablet before meals and 1 tablet after meals.) 270 capsule 0   losartan (COZAAR) 50 MG tablet Take 50 mg by mouth daily.     Magnesium 250 MG TABS Take 250 mg by mouth daily.     Menthol-Methyl Salicylate (SALONPAS PAIN RELIEF PATCH) PTCH Apply 1 patch topically daily as needed (pain).     metFORMIN (GLUCOPHAGE-XR) 500 MG 24 hr tablet Take 1 tablet (500 mg total) by mouth 2 (two) times daily with a meal. 180 tablet 2   metoprolol succinate (TOPROL-XL) 100 MG 24 hr tablet Take 100 mg by mouth daily. Take with or immediately following a meal.     Multiple Vitamins-Minerals (CENTRUM SILVER 50+WOMEN) TABS Take 1 tablet by mouth daily.     OVER THE COUNTER MEDICATION Take 1 tablet by mouth daily. Brain health supplement     oxybutynin (DITROPAN-XL) 10 MG 24 hr tablet Take 10 mg by mouth daily.     pantoprazole (PROTONIX)  40 MG tablet Take 40 mg by mouth 2 (two) times daily.     polyethylene glycol (MIRALAX / GLYCOLAX) 17 g packet Take 17 g by mouth daily as needed. (Patient not taking: Reported on 10/17/2023) 14 each 0   temazepam (RESTORIL) 30 MG capsule Take 30 mg by mouth at bedtime as needed for sleep.  0   timolol (TIMOPTIC) 0.5 % ophthalmic solution Place 1 drop into both eyes every morning.     traMADol (ULTRAM) 50 MG tablet Take 50 mg by mouth every 8 (eight) hours as needed for moderate pain.     traZODone (DESYREL) 50 MG tablet Take 75 mg by mouth at bedtime as needed for sleep.     No current facility-administered medications on file prior to visit.   Allergies  Allergen Reactions   Wound Dressing Adhesive Rash and Other (See Comments)    Pt states that tape and electrodes leave red "scars" on her skin and her skin is very sensitive.  Paper tape ok to use   Family History  Problem Relation Age of Onset   Heart disease Father    Heart attack Father        MI at age 76   Hypertension Mother    Alzheimer's disease Mother    Hypertension Sister    Diabetes Brother    Hyperlipidemia  Brother    Hypertension Brother    Heart disease Brother    Heart attack Brother    Breast cancer Maternal Aunt    PE: There were no vitals taken for this visit. Wt Readings from Last 3 Encounters:  10/23/23 114 lb (51.7 kg)  09/27/23 111 lb (50.3 kg)  09/25/23 112 lb (50.8 kg)   Constitutional: thin, in NAD Eyes: EOMI, no exophthalmos ENT: no thyromegaly, no cervical lymphadenopathy Cardiovascular: RRR, No MRG Respiratory: CTA B Musculoskeletal: no deformities Skin:  no rashes Neurological: no tremor with outstretched hands  1. DM2, insulin-dependent, controlled, with complications - CAD - PAD - s/p stents  Component     Latest Ref Rng 04/20/2022  Glucose     65 - 99 mg/dL 161 (H)   C-Peptide     0.80 - 3.85 ng/mL 1.50     2.  Hyperlipidemia  PLAN:  1. Patient with longstanding,  uncontrolled, type 2 diabetes, on oral antidiabetic regimen with metformin and bolus insulin, to which we added basal insulin at last visit.  At that time sugars were elevated, almost entirely fluctuating above the upper limit of the target range, with higher blood sugars after lunch and then again after dinner.  Upon questioning, she was not able to start Lantus as suggested at the previous visit that she did not receive it from the pharmacy.  I sent a prescription for this again but if this was not covered, we discussed that we could use any other long-acting insulin.  HbA1c at last visit was higher, at 9.5%. CGM interpretation: -At today's visit, we reviewed her CGM downloads: It appears that *** of values are in target range (goal >70%), while *** are higher than 180 (goal <25%), and *** are lower than 70 (goal <4%).  The calculated average blood sugar is ***.  The projected HbA1c for the next 3 months (GMI) is ***. -Reviewing the CGM trends, *** - I suggested to:  Patient Instructions  Please continue: - Metformin ER 500 mg 2x a day, with meals - Lantus 20 units at night - NovoLog 4-8 units 2-3x a day before the meal Correction scale for NovoLog: >200: + 1 unit >300: + 2 units >400 + 3 units  Please return in 2 months.  - we checked her HbA1c: 7%  - advised to check sugars at different times of the day - 4x a day, rotating check times - advised for yearly eye exams >> she is UTD - return to clinic in 2 months  2. HL -Reviewed latest lipid panel from 04/2023: All fractions at goal -She continues on Lovastatin 20 mg daily without side effects  Carlus Pavlov, MD PhD [facility] Endocrinology

## 2023-11-08 ENCOUNTER — Ambulatory Visit (INDEPENDENT_AMBULATORY_CARE_PROVIDER_SITE_OTHER): Payer: Medicare HMO | Admitting: Internal Medicine

## 2023-11-08 ENCOUNTER — Encounter: Payer: Self-pay | Admitting: Internal Medicine

## 2023-11-08 VITALS — BP 130/78 | HR 57 | Ht 63.0 in | Wt 111.0 lb

## 2023-11-08 DIAGNOSIS — E7800 Pure hypercholesterolemia, unspecified: Secondary | ICD-10-CM | POA: Diagnosis not present

## 2023-11-08 DIAGNOSIS — E1151 Type 2 diabetes mellitus with diabetic peripheral angiopathy without gangrene: Secondary | ICD-10-CM | POA: Diagnosis not present

## 2023-11-08 DIAGNOSIS — Z794 Long term (current) use of insulin: Secondary | ICD-10-CM

## 2023-11-08 MED ORDER — LANTUS SOLOSTAR 100 UNIT/ML ~~LOC~~ SOPN: 16.0000 [IU] | PEN_INJECTOR | Freq: Every day | SUBCUTANEOUS | Status: DC

## 2023-11-08 NOTE — Patient Instructions (Addendum)
Please continue: - Metformin ER 500 mg 2x a day, with meals  Change: - NovoLog 6-10 units 2-3x a day 15 min before the meal Correction scale with  >200: + 1 unit >300: + 2 units >400 + 3 units If you have to take the insulin after the meal, take only 50% of the dose.  Change: - Lantus 16 units at night   Please return in 2 months.

## 2023-11-08 NOTE — Progress Notes (Signed)
Patient ID: Jill Higgins, female   DOB: [birth year], 77 y.o.   MRN: [id number] This note was precharted 11/06/2023.  HPI: Jill Higgins is a 77 y.o.-year-old female, returning for follow-up for DM2, dx in 2013, insulin-dependent, uncontrolled, with complications (CAD, PAD). Pt. previously saw Dr. Everardo All, but last visit with me 1.5 mo ago.  months ago.  Interim history: She has increased urination, but no blurry vision, chest pain. She feels that her sugars never improved after steroid inj + po steroids in 05/2023 for knee pain. She has claudication - will have a new leg surgery - axillo-femoral bypass graft in 1 week. She had several sensors failing since last visit due to a defective receiver.  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  Reviewed HbA1c: Lab Results  Component Value Date   HGBA1C 9.5 (A) 09/27/2023   HGBA1C 7.3 (A) 05/10/2023   HGBA1C 5.6 12/18/2022   HGBA1C 7.0 (A) 08/17/2022   HGBA1C 7.0 (A) 04/20/2022   HGBA1C 6.3 (A) 01/23/2022   HGBA1C 6.8 (A) 10/24/2021   HGBA1C 7.5 (A) 07/18/2021   HGBA1C 7.9 (A) 02/01/2021   HGBA1C 8.1 (A) 12/03/2020  06/26/2022: HbA1c 8.5%   She was previously on: - Metformin ER 1000 >> 500 mg 2x a day, with meals  - NovoLog 4-8 units 2x a day >> Novolog 2-6 units 2x a day 15 min before the meal If the sugars are high between meals, you can correct with NovoLog: >200: + 1 unit >300: + 2 units >400 + 3 units She had previous hypoglycemia with glipizide. She was on repaglinide until we started NovoLog.  Currently on:  - Metformin ER 500 mg 2x a day, with meals - Lantus 8-10 units at night  - added 09/2023 >> 20 units daily - FiAsp >> Novolog 4-8 units 2-3x a day before the meal Correction scale with Fiasp: >200: + 1 unit >300: + 2 units >400 + 3  units  Pt checks her sugars >4x a day with the Libre 2 CGM:  Previously:  Previously:   Lowest blood sugars: 54 - pm >> 49 >> 70 >> 55; she has hypoglycemia awareness at 70.  Highest sugar was 300s (hand surgery) >> 300s>> 300s.  Glucometer: Accu-Chek guide  Pt's meals - 2x a day.  - no CKD, BUN/creatinine: Lab Results  Component Value Date   BUN 8 10/23/2023   BUN 11 04/27/2023   CREATININE 0.71 10/23/2023   CREATININE 0.70 09/27/2023   Lab Results  Component Value Date   MICRALBCREAT 1.2 09/27/2023  She is not on ACE inhibitor/ARB.  -+ HL; last set of lipids: Lab Results  Component Value Date   CHOL 121 04/21/2023   HDL 64 04/21/2023   LDLCALC 38 04/21/2023   TRIG 97 04/21/2023   CHOLHDL 1.9 04/21/2023  On Mevacor 10 mg daily  - last eye exam was in 2024. No DR reportedly. + Glaucoma. Dr. Joseph Art.  - + numbness and tingling in her feet.  Last foot exam 09/27/2023.  On Gabapentin.  She has a history of HTN, overactive bladder, depression/anxiety, anemia-on iron 325 mg daily. 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   ROS: + see HPI  Past Medical History:  Diagnosis Date   Bronchitis  Bulging disc    in neck   Depression with anxiety    Diabetes mellitus    Type II   Fibromyalgia    pt. denies   GERD (gastroesophageal reflux disease)    Glaucoma    History of bronchitis    Hyperlipidemia    Hypertension    Overactive bladder    Peripheral vascular disease (HCC)    Pneumonia    Sleep apnea    recent test negative for sleep apnea   Tobacco abuse    Urinary frequency    Past Surgical History:  Procedure Laterality Date   ABDOMINAL AORTAGRAM N/A 08/15/2013   Procedure: ABDOMINAL Ronny Flurry;  Surgeon: Sherren Kerns, MD;  Location: [facility] CATH LAB;  Service: Cardiovascular;  Laterality: N/A;   ABDOMINAL AORTAGRAM N/A 06/19/2014   Procedure: ABDOMINAL  Ronny Flurry;  Surgeon: Sherren Kerns, MD;  Location: [facility] CATH LAB;  Service: Cardiovascular;  Laterality: N/A;   ABDOMINAL AORTOGRAM W/LOWER EXTREMITY N/A 05/10/2018   Procedure: ABDOMINAL AORTOGRAM W/LOWER EXTREMITY;  Surgeon: Sherren Kerns, MD;  Location: [facility] INVASIVE CV LAB;  Service: Cardiovascular;  Laterality: N/A;  bilateral   ABDOMINAL AORTOGRAM W/LOWER EXTREMITY N/A 04/20/2023   Procedure: ABDOMINAL AORTOGRAM W/LOWER EXTREMITY;  Surgeon: Leonie Douglas, MD;  Location: [facility] INVASIVE CV LAB;  Service: Cardiovascular;  Laterality: N/A;   Aortogram w/ PTA  05/14/08,  11-04-10   Bilateral aortogram w/ bilateral  SFA PTA  stenting    BREAST SURGERY Right    boil removal   BUNIONECTOMY     L foot in the 1980s   COLONOSCOPY     2014   DILATION AND CURETTAGE OF UTERUS     X4   ENDARTERECTOMY FEMORAL Right 06/03/2018   Procedure: RIGHT FEMORAL ENDARTERECTOMY;  Surgeon: Sherren Kerns, MD;  Location[facility] OR;  Service: Vascular;  Laterality: Right;   Epidural shots in neck      ESOPHAGOGASTRODUODENOSCOPY (EGD) WITH PROPOFOL N/A 09/15/2019   Procedure: ESOPHAGOGASTRODUODENOSCOPY (EGD) WITH PROPOFOL;  Surgeon: Willis Modena, MD;  Location: [facility] ENDOSCOPY;  Service: Endoscopy;  Laterality: N/A;   EUS N/A 09/15/2019   Procedure: UPPER ENDOSCOPIC ULTRASOUND (EUS) LINEAR;  Surgeon: Willis Modena, MD;  Location: [facility] ENDOSCOPY;  Service: Endoscopy;  Laterality: N/A;   EYE SURGERY Bilateral 2016   cataract removal   FEMORAL-POPLITEAL BYPASS GRAFT Left 10/21/2013   Procedure: LEFT FEMORAL-POPLITEAL ARTERY BYPASS WITH SAPHENOUS VEIN GRAFT , POPLITEAL ENDARTERECTOMY ,INTRAOPERATIVE ARTERIOGRAM, vein patch angioplasty to popliteal artery;  Surgeon: Sherren Kerns, MD;  Location: [facility] OR;  Service: Vascular;  Laterality: Left;   FEMORAL-POPLITEAL BYPASS GRAFT Right 02/08/2016   Procedure: Right FEMORAL- to Above Knee POPLITEAL ARTERY Bypass Graft with reversed saphenous vein and Common Femoral  Endarterectomy  with profundoplasty;  Surgeon: Sherren Kerns, MD;  Location: [facility] OR;  Service: Vascular;  Laterality: Right;   FINE NEEDLE ASPIRATION N/A 09/15/2019   Procedure: FINE NEEDLE ASPIRATION (FNA) LINEAR;  Surgeon: Willis Modena, MD;  Location: [facility] ENDOSCOPY;  Service: Endoscopy;  Laterality: N/A;   GROIN DEBRIDEMENT Left 10/28/2013   Procedure: left inner thigh DEBRIDEMENT;  Surgeon: Sherren Kerns, MD;  Location: [facility] OR;  Service: Vascular;  Laterality: Left;   HAND SURGERY Left 2010   LOWER EXTREMITY ANGIOGRAM Bilateral 12/24/2015   Procedure: Lower Extremity Angiogram;  Surgeon: Sherren Kerns, MD;  Location: [facility] INVASIVE CV LAB;  Service: Cardiovascular;  Laterality: Bilateral;   PATCH ANGIOPLASTY Right 06/03/2018   Procedure: PATCH ANGIOPLASTY USING A XENOSURE 1CM X  14CM BIOLOGIC PATCH;  Surgeon: Sherren Kerns, MD;  Location: [facility] OR;  Service: Vascular;  Laterality: Right;   PERIPHERAL VASCULAR CATHETERIZATION N/A 12/24/2015   Procedure: Abdominal Aortogram;  Surgeon: Sherren Kerns, MD;  Location: [facility] INVASIVE CV LAB;  Service: Cardiovascular;  Laterality: N/A;   PERIPHERAL VASCULAR CATHETERIZATION Left 12/24/2015   Procedure: Peripheral Vascular Balloon Angioplasty;  Surgeon: Sherren Kerns, MD;  Location: [facility] INVASIVE CV LAB;  Service: Cardiovascular;  Laterality: Left;  drug coated balloon   PERIPHERAL VASCULAR CATHETERIZATION N/A 06/30/2016   Procedure: Abdominal Aortogram;  Surgeon: Sherren Kerns, MD;  Location: [facility] INVASIVE CV LAB;  Service: Cardiovascular;  Laterality: N/A;   PERIPHERAL VASCULAR CATHETERIZATION Bilateral 06/30/2016   Procedure: Lower Extremity Angiography;  Surgeon: Sherren Kerns, MD;  Location: [facility] INVASIVE CV LAB;  Service: Cardiovascular;  Laterality: Bilateral;   TONSILLECTOMY     Social History   Socioeconomic History   Marital status: Single    Spouse name: Not on file   Number of children: 1   Years of education: Not on file   Highest  education level: Not on file  Occupational History   Occupation: Retired    Associate Professor: Scientist, water quality  Tobacco Use   Smoking status: Former    Current packs/day: 0.25    Average packs/day: 0.3 packs/day for 0.4 years (0.1 ttl pk-yrs)    Types: Cigarettes    Start date: 06/2023    Quit date: 10/2014    Passive exposure: Never   Smokeless tobacco: Never   Tobacco comments:    pt states that she is using the E cig only   Vaping Use   Vaping status: Some Days  Substance and Sexual Activity   Alcohol use: Yes    Alcohol/week: 0.0 - 1.0 standard drinks of alcohol    Comment: Occasional; 10-22 rarely    Drug use: No   Sexual activity: Not on file  Other Topics Concern   Not on file  Social History Narrative   Drinks about 2 cups of coffee a day, and some tea    Social Drivers of Corporate investment banker Strain: Not on file  Food Insecurity: No Food Insecurity (04/20/2023)   Hunger Vital Sign    Worried About Running Out of Food in the Last Year: Never true    Ran Out of Food in the Last Year: Never true  Transportation Needs: No Transportation Needs (04/20/2023)   PRAPARE - Administrator, Civil Service (Medical): No    Lack of Transportation (Non-Medical): No  Physical Activity: Not on file  Stress: Not on file  Social Connections: Not on file  Intimate Partner Violence: Not At Risk (04/20/2023)   Humiliation, Afraid, Rape, and Kick questionnaire    Fear of Current or Ex-Partner: No    Emotionally Abused: No    Physically Abused: No    Sexually Abused: No   Current Outpatient Medications on File Prior to Visit  Medication Sig Dispense Refill   amLODipine (NORVASC) 5 MG tablet Take 5 mg by mouth daily.     aspirin EC 81 MG tablet Take 81 mg by mouth at bedtime.     atorvastatin (LIPITOR) 80 MG tablet Take 1 tablet (80 mg total) by mouth daily. 30 tablet 11   clopidogrel (PLAVIX) 75 MG tablet Take 1 tablet (75 mg total) by mouth daily with breakfast. 30 tablet 3    Continuous Glucose Receiver (FREESTYLE LIBRE 3 READER) DEVI Use to monitor glucose  continuously. 1 each 0   Continuous Glucose Sensor (FREESTYLE LIBRE 3 PLUS SENSOR) MISC Inject 1 Device into the skin continuous. Change every 15 days 6 each 3   diclofenac Sodium (VOLTAREN) 1 % GEL Apply 1 application. topically daily as needed (leg pain).     diphenhydramine-acetaminophen (TYLENOL PM) 25-500 MG TABS tablet Take 1 tablet by mouth at bedtime as needed (sleep).     DULoxetine (CYMBALTA) 60 MG capsule Take 60 mg by mouth daily.     ferrous sulfate 325 (65 FE) MG EC tablet Take 325 mg by mouth daily.     gabapentin (NEURONTIN) 600 MG tablet Take 600 mg by mouth 3 (three) times daily.     insulin aspart (NOVOLOG FLEXPEN) 100 UNIT/ML FlexPen 4-8 units with every meal 15 mL 11   insulin glargine (LANTUS SOLOSTAR) 100 UNIT/ML Solostar Pen Inject 20 Units into the skin daily. (Patient taking differently: Inject 20 Units into the skin at bedtime.) 30 mL 3   Insulin Pen Needle 32G X 4 MM MISC Use 4x a day 300 each 3   latanoprost (XALATAN) 0.005 % ophthalmic solution Place 1 drop into both eyes at bedtime.     lipase/protease/amylase 24000-76000 units CPEP Take 1 capsule (24,000 Units total) by mouth 3 (three) times daily before meals. (Patient taking differently: Take 24,000-48,000 Units by mouth See admin instructions. Take 2 tablet before meals and 1 tablet after meals.) 270 capsule 0   losartan (COZAAR) 50 MG tablet Take 50 mg by mouth daily.     Magnesium 250 MG TABS Take 250 mg by mouth daily.     Menthol-Methyl Salicylate (SALONPAS PAIN RELIEF PATCH) PTCH Apply 1 patch topically daily as needed (pain).     metFORMIN (GLUCOPHAGE-XR) 500 MG 24 hr tablet Take 1 tablet (500 mg total) by mouth 2 (two) times daily with a meal. 180 tablet 2   metoprolol succinate (TOPROL-XL) 100 MG 24 hr tablet Take 100 mg by mouth daily. Take with or immediately following a meal.     Multiple Vitamins-Minerals (CENTRUM SILVER  50+WOMEN) TABS Take 1 tablet by mouth daily.     OVER THE COUNTER MEDICATION Take 1 tablet by mouth daily. Brain health supplement     oxybutynin (DITROPAN-XL) 10 MG 24 hr tablet Take 10 mg by mouth daily.     pantoprazole (PROTONIX) 40 MG tablet Take 40 mg by mouth 2 (two) times daily.     polyethylene glycol (MIRALAX / GLYCOLAX) 17 g packet Take 17 g by mouth daily as needed. (Patient not taking: Reported on 10/17/2023) 14 each 0   temazepam (RESTORIL) 30 MG capsule Take 30 mg by mouth at bedtime as needed for sleep.  0   timolol (TIMOPTIC) 0.5 % ophthalmic solution Place 1 drop into both eyes every morning.     traMADol (ULTRAM) 50 MG tablet Take 50 mg by mouth every 8 (eight) hours as needed for moderate pain.     traZODone (DESYREL) 50 MG tablet Take 75 mg by mouth at bedtime as needed for sleep.     No current facility-administered medications on file prior to visit.   Allergies  Allergen Reactions   Wound Dressing Adhesive Rash and Other (See Comments)    Pt states that tape and electrodes leave red "scars" on her skin and her skin is very sensitive.  Paper tape ok to use   Family History  Problem Relation Age of Onset   Heart disease Father    Heart attack Father  MI at age 66   Hypertension Mother    Alzheimer's disease Mother    Hypertension Sister    Diabetes Brother    Hyperlipidemia Brother    Hypertension Brother    Heart disease Brother    Heart attack Brother    Breast cancer Maternal Aunt    PE: BP 130/78   Pulse (!) 57   Ht 5\' 3"  (1.6 m)   Wt 111 lb (50.3 kg)   SpO2 97%   BMI 19.66 kg/m  Wt Readings from Last 3 Encounters:  11/08/23 111 lb (50.3 kg)  10/23/23 114 lb (51.7 kg)  09/27/23 111 lb (50.3 kg)   Constitutional: thin, in NAD Eyes: EOMI, no exophthalmos ENT: no thyromegaly, no cervical lymphadenopathy Cardiovascular: RRR, No MRG Respiratory: CTA B Musculoskeletal: no deformities Skin:  no rashes Neurological: no tremor with  outstretched hands  1. DM2, insulin-dependent, controlled, with complications - CAD - PAD - s/p stents  Component     Latest Ref Rng 04/20/2022  Glucose     65 - 99 mg/dL 657 (H)   C-Peptide     0.80 - 3.85 ng/mL 1.50     2.  Hyperlipidemia  PLAN:  1. Patient with longstanding, uncontrolled, type 2 diabetes, on oral antidiabetic regimen with metformin and bolus insulin, to which we added basal insulin at last visit.  At that time sugars were elevated, almost entirely fluctuating above the upper limit of the target range, with higher blood sugars after lunch and then again after dinner.  Upon questioning, she was not able to start Lantus as suggested at the previous visit that she did not receive it from the pharmacy.  I sent a prescription for this again but if this was not covered, we discussed that we could use any other long-acting insulin.  Last visit, HbA1c was higher, at 9.5% CGM interpretation: -At today's visit, we reviewed her CGM downloads: It appears that 61% of values are in target range (goal >70%), while 33% are higher than 180 (goal <25%), and 6% are lower than 70 (goal <4%).  The calculated average blood sugar is 159.  The projected HbA1c for the next 3 months (GMI) is 7.1%. -Reviewing the CGM trends, overall, sugars improved significantly since last visit, after the addition of insulin.  They are fluctuating mostly within the target range but increasing above target after meals and particularly in the first half of the night.  Upon questioning, she is keeping her pens in the fridge and she is not taking them with her for this reason even when eating out.  Therefore, she is taking the insulin after meals mostly.  We discussed about the importance of injecting NovoLog 15 minutes before each meal and I advised her to keep the pen in use out of the fridge and take her with her.  I also advised her that if she forgets to take the dose and has to take it after the meal, to only take 50%  of the dose to avoid hypoglycemia late after meals.  Since she feels that she is dropping her blood sugars too low overnight and indeed, reviewing her CGM downloads she does have some blood sugars under 70s, we discussed about reducing the Lantus slightly.  However, I advised her to try to vary the NovoLog doses more before meals, depending on the size and consistency of her meals.  Will continue the metformin at the same dose for now. - I suggested to:  Patient Instructions  Please  continue: - Metformin ER 500 mg 2x a day, with meals  Change: - NovoLog 6-10 units 2-3x a day 15 min before the meal Correction scale with  >200: + 1 unit >300: + 2 units >400 + 3 units If you have to take the insulin after the meal, take only 50% of the dose.  Change: - Lantus 16 units at night   Please return in 2 months.  - advised to check sugars at different times of the day - 4x a day, rotating check times - advised for yearly eye exams >> she is UTD - return to clinic in 2 months  2. HL -Reviewed latest lipid panel from 04/2023: All fractions at goal -She continues lovastatin 20 mg daily without side effects  Carlus Pavlov, MD PhD [facility] Endocrinology

## 2023-11-13 ENCOUNTER — Encounter (HOSPITAL_COMMUNITY): Payer: Self-pay | Admitting: Vascular Surgery

## 2023-11-13 NOTE — Progress Notes (Signed)
SDW call  Patient was given pre-op instructions over the phone. Patient verbalized understanding of instructions provided.  Reports she has no symptoms of a UTI, denies, pain, fever or SOB.      PCP - Dr. Merri Brunette Cardiologist - Dr. Olga Millers Pulmonary:    PPM/ICD - denies Device Orders - na Rep Notified - na   Chest x-ray - na EKG -  04/20/2023 Stress Test -12/16/2020 ECHO - 12/31/2020 Cardiac Cath -   Sleep Study/sleep apnea/CPAP: denies  Type II diabetic Fasting Blood sugar range: 90-100 How often check sugars: daily Metformin, instructed to hold DOS Lantus, instructed to use 8 units the night before which is 50 % of her regular dose Novolog, instructed to use zero units the DOS.  Use 1/2 of correction dose if blood sugar is greater than 220.    Blood Thinner Instructions: Plavix, states last dose 11/08/2023 Aspirin Instructions:continue   ERAS Protcol - NPO  COVID TEST- na    Anesthesia review: No   Patient denies shortness of breath, fever, cough and chest pain over the phone call  Your procedure is scheduled on Thursday November 15, 2023  Report to [facility] Main Entrance "A" at  0530  A.M., then check in with the Admitting office.  Call this number if you have problems the morning of surgery:  [phone]   If you have any questions prior to your surgery date call [phone]: Open Monday-Friday 8am-4pm If you experience any cold or flu symptoms such as cough, fever, chills, shortness of breath, etc. between now and your scheduled surgery, please notify us at the above number    Remember:  Do not eat or drink after midnight the night before your surgery  Take these medicines the morning of surgery with A SIP OF WATER:  Amlodipine, ASA, atorvastatin, duloxetine, gabapentin, metoprolol, ditropan, protonix, timolol eye drops.   As needed: Tramadol  As of today, STOP taking any Aspirin (unless otherwise instructed by your surgeon) Aleve, Naproxen,  Ibuprofen, Motrin, Advil, Goody's, BC's, all herbal medications, fish oil, and all vitamins.  This includes your Voltaren

## 2023-11-15 ENCOUNTER — Inpatient Hospital Stay (HOSPITAL_COMMUNITY): Payer: Medicare HMO | Admitting: Anesthesiology

## 2023-11-15 ENCOUNTER — Inpatient Hospital Stay (HOSPITAL_COMMUNITY)
Admission: RE | Admit: 2023-11-15 | Discharge: 2023-11-20 | DRG: 253 | Disposition: A | Discharge status: Home Health | Payer: Medicare HMO | Attending: Vascular Surgery | Admitting: Vascular Surgery

## 2023-11-15 ENCOUNTER — Encounter (HOSPITAL_COMMUNITY): Payer: Self-pay | Admitting: Vascular Surgery

## 2023-11-15 ENCOUNTER — Encounter (HOSPITAL_COMMUNITY)
Admission: RE | Disposition: A | Discharge status: Home Health | Payer: Self-pay | Source: Home / Self Care | Attending: Vascular Surgery

## 2023-11-15 ENCOUNTER — Ambulatory Visit: Payer: Medicare HMO | Admitting: Internal Medicine

## 2023-11-15 ENCOUNTER — Other Ambulatory Visit: Payer: Self-pay

## 2023-11-15 DIAGNOSIS — I10 Essential (primary) hypertension: Secondary | ICD-10-CM | POA: Diagnosis not present

## 2023-11-15 DIAGNOSIS — I745 Embolism and thrombosis of iliac artery: Secondary | ICD-10-CM | POA: Diagnosis not present

## 2023-11-15 DIAGNOSIS — F17290 Nicotine dependence, other tobacco product, uncomplicated: Secondary | ICD-10-CM | POA: Diagnosis present

## 2023-11-15 DIAGNOSIS — R269 Unspecified abnormalities of gait and mobility: Secondary | ICD-10-CM | POA: Diagnosis not present

## 2023-11-15 DIAGNOSIS — T82868A Thrombosis of vascular prosthetic devices, implants and grafts, initial encounter: Principal | ICD-10-CM | POA: Diagnosis present

## 2023-11-15 DIAGNOSIS — Z79899 Other long term (current) drug therapy: Secondary | ICD-10-CM

## 2023-11-15 DIAGNOSIS — E1151 Type 2 diabetes mellitus with diabetic peripheral angiopathy without gangrene: Secondary | ICD-10-CM | POA: Diagnosis not present

## 2023-11-15 DIAGNOSIS — I70211 Atherosclerosis of native arteries of extremities with intermittent claudication, right leg: Secondary | ICD-10-CM | POA: Diagnosis present

## 2023-11-15 DIAGNOSIS — Z833 Family history of diabetes mellitus: Secondary | ICD-10-CM | POA: Diagnosis not present

## 2023-11-15 DIAGNOSIS — H409 Unspecified glaucoma: Secondary | ICD-10-CM | POA: Diagnosis present

## 2023-11-15 DIAGNOSIS — Z803 Family history of malignant neoplasm of breast: Secondary | ICD-10-CM | POA: Diagnosis not present

## 2023-11-15 DIAGNOSIS — Y712 Prosthetic and other implants, materials and accessory cardiovascular devices associated with adverse incidents: Secondary | ICD-10-CM | POA: Diagnosis not present

## 2023-11-15 DIAGNOSIS — M797 Fibromyalgia: Secondary | ICD-10-CM | POA: Diagnosis not present

## 2023-11-15 DIAGNOSIS — Z7982 Long term (current) use of aspirin: Secondary | ICD-10-CM | POA: Diagnosis not present

## 2023-11-15 DIAGNOSIS — I739 Peripheral vascular disease, unspecified: Principal | ICD-10-CM | POA: Diagnosis present

## 2023-11-15 DIAGNOSIS — E119 Type 2 diabetes mellitus without complications: Secondary | ICD-10-CM | POA: Diagnosis not present

## 2023-11-15 DIAGNOSIS — Z83438 Family history of other disorder of lipoprotein metabolism and other lipidemia: Secondary | ICD-10-CM

## 2023-11-15 DIAGNOSIS — R2689 Other abnormalities of gait and mobility: Secondary | ICD-10-CM | POA: Diagnosis not present

## 2023-11-15 DIAGNOSIS — Z8249 Family history of ischemic heart disease and other diseases of the circulatory system: Secondary | ICD-10-CM

## 2023-11-15 DIAGNOSIS — E785 Hyperlipidemia, unspecified: Secondary | ICD-10-CM | POA: Diagnosis not present

## 2023-11-15 DIAGNOSIS — K219 Gastro-esophageal reflux disease without esophagitis: Secondary | ICD-10-CM | POA: Diagnosis present

## 2023-11-15 DIAGNOSIS — Z820 Family history of epilepsy and other diseases of the nervous system: Secondary | ICD-10-CM | POA: Diagnosis not present

## 2023-11-15 DIAGNOSIS — Z7902 Long term (current) use of antithrombotics/antiplatelets: Secondary | ICD-10-CM | POA: Diagnosis not present

## 2023-11-15 DIAGNOSIS — J189 Pneumonia, unspecified organism: Secondary | ICD-10-CM | POA: Diagnosis not present

## 2023-11-15 HISTORY — PX: AXILLARY-FEMORAL BYPASS GRAFT: SHX894

## 2023-11-15 LAB — CBC
HCT: 35.4 % — ABNORMAL LOW (ref 36.0–46.0)
Hemoglobin: 11.6 g/dL — ABNORMAL LOW (ref 12.0–15.0)
MCH: 31.6 pg (ref 26.0–34.0)
MCHC: 32.8 g/dL (ref 30.0–36.0)
MCV: 96.5 fL (ref 80.0–100.0)
Platelets: 286 10*3/uL (ref 150–400)
RBC: 3.67 MIL/uL — ABNORMAL LOW (ref 3.87–5.11)
RDW: 13.1 % (ref 11.5–15.5)
WBC: 9.4 10*3/uL (ref 4.0–10.5)
nRBC: 0 % (ref 0.0–0.2)

## 2023-11-15 LAB — GLUCOSE, CAPILLARY
Glucose-Capillary: 100 mg/dL — ABNORMAL HIGH (ref 70–99)
Glucose-Capillary: 155 mg/dL — ABNORMAL HIGH (ref 70–99)
Glucose-Capillary: 282 mg/dL — ABNORMAL HIGH (ref 70–99)
Glucose-Capillary: 246 mg/dL — ABNORMAL HIGH (ref 70–99)

## 2023-11-15 LAB — POCT I-STAT, CHEM 8
BUN: 10 mg/dL (ref 8–23)
Calcium, Ion: 1.14 mmol/L — ABNORMAL LOW (ref 1.15–1.40)
Chloride: 106 mmol/L (ref 98–111)
Creatinine, Ser: 0.9 mg/dL (ref 0.44–1.00)
Glucose, Bld: 109 mg/dL — ABNORMAL HIGH (ref 70–99)
HCT: 39 % (ref 36.0–46.0)
Hemoglobin: 13.3 g/dL (ref 12.0–15.0)
Potassium: 4.2 mmol/L (ref 3.5–5.1)
Sodium: 139 mmol/L (ref 135–145)
TCO2: 26 mmol/L (ref 22–32)

## 2023-11-15 LAB — TYPE AND SCREEN
ABO/RH(D): A POS
Antibody Screen: NEGATIVE

## 2023-11-15 LAB — CREATININE, SERUM
Creatinine, Ser: 0.87 mg/dL (ref 0.44–1.00)
GFR, Estimated: >60 mL/min (ref >60)

## 2023-11-15 SURGERY — CREATION, BYPASS, ARTERIAL, AXILLARY TO BILATERAL FEMORAL, USING GRAFT
Anesthesia: General | Laterality: Right

## 2023-11-15 MED ORDER — ADULT MULTIVITAMIN W/MINERALS CH
1.0000 | ORAL_TABLET | Freq: Every day | ORAL | Status: DC
  Administered 2023-11-15 – 2023-11-20 (×6): 1 via ORAL
  Filled 2023-11-15 (×7): qty 1

## 2023-11-15 MED ORDER — METOPROLOL TARTRATE 5 MG/5ML IV SOLN
2.0000 mg | INTRAVENOUS | Status: DC | PRN
Start: 2023-11-15 — End: 2023-11-20

## 2023-11-15 MED ORDER — LATANOPROST 0.005 % OP SOLN
1.0000 [drp] | Freq: Every day | OPHTHALMIC | Status: DC
  Administered 2023-11-15 – 2023-11-19 (×5): 1 [drp] via OPHTHALMIC
  Filled 2023-11-15: qty 2.5

## 2023-11-15 MED ORDER — AMLODIPINE BESYLATE 5 MG PO TABS
5.0000 mg | ORAL_TABLET | Freq: Every day | ORAL | Status: DC
  Administered 2023-11-16 – 2023-11-20 (×5): 5 mg via ORAL
  Filled 2023-11-15 (×5): qty 1

## 2023-11-15 MED ORDER — ONDANSETRON HCL 4 MG/2ML IJ SOLN
4.0000 mg | Freq: Four times a day (QID) | INTRAMUSCULAR | Status: DC | PRN
Start: 2023-11-15 — End: 2023-11-20

## 2023-11-15 MED ORDER — LIDOCAINE 2% (20 MG/ML) 5 ML SYRINGE
INTRAMUSCULAR | Status: AC
  Filled 2023-11-15: qty 5

## 2023-11-15 MED ORDER — SODIUM CHLORIDE 0.9% FLUSH: 3.0000 mL | Freq: Two times a day (BID) | INTRAVENOUS | Status: DC

## 2023-11-15 MED ORDER — FENTANYL CITRATE (PF) 250 MCG/5ML IJ SOLN
INTRAMUSCULAR | Status: DC | PRN
  Administered 2023-11-15 (×2): 50 ug via INTRAVENOUS
  Administered 2023-11-15: 100 ug via INTRAVENOUS

## 2023-11-15 MED ORDER — TRAMADOL HCL 50 MG PO TABS
50.0000 mg | ORAL_TABLET | Freq: Three times a day (TID) | ORAL | Status: DC | PRN
  Administered 2023-11-15 – 2023-11-20 (×8): 50 mg via ORAL
  Filled 2023-11-15 (×8): qty 1

## 2023-11-15 MED ORDER — SODIUM CHLORIDE 0.9 % IV SOLN: INTRAVENOUS | Status: DC

## 2023-11-15 MED ORDER — DEXAMETHASONE SODIUM PHOSPHATE 10 MG/ML IJ SOLN
INTRAMUSCULAR | Status: AC
  Filled 2023-11-15: qty 1

## 2023-11-15 MED ORDER — FENTANYL CITRATE (PF) 250 MCG/5ML IJ SOLN
INTRAMUSCULAR | Status: AC
  Filled 2023-11-15: qty 5

## 2023-11-15 MED ORDER — CHLORHEXIDINE GLUCONATE 0.12 % MT SOLN: 15.0000 mL | Freq: Once | OROMUCOSAL | Status: AC

## 2023-11-15 MED ORDER — PHENYLEPHRINE HCL-NACL 20-0.9 MG/250ML-% IV SOLN
INTRAVENOUS | Status: DC | PRN
  Administered 2023-11-15: 40 ug/min via INTRAVENOUS
  Administered 2023-11-15: 80 ug via INTRAVENOUS
  Administered 2023-11-15: 120 ug via INTRAVENOUS

## 2023-11-15 MED ORDER — PANTOPRAZOLE SODIUM 40 MG PO TBEC
40.0000 mg | DELAYED_RELEASE_TABLET | Freq: Two times a day (BID) | ORAL | Status: DC
  Administered 2023-11-15 – 2023-11-20 (×11): 40 mg via ORAL
  Filled 2023-11-15 (×11): qty 1

## 2023-11-15 MED ORDER — HEPARIN SODIUM (PORCINE) 5000 UNIT/ML IJ SOLN
5000.0000 [IU] | Freq: Three times a day (TID) | INTRAMUSCULAR | Status: DC
  Administered 2023-11-16 – 2023-11-20 (×13): 5000 [IU] via SUBCUTANEOUS
  Filled 2023-11-15 (×13): qty 1

## 2023-11-15 MED ORDER — PROTAMINE SULFATE 10 MG/ML IV SOLN
INTRAVENOUS | Status: DC | PRN
  Administered 2023-11-15: 50 mg via INTRAVENOUS

## 2023-11-15 MED ORDER — FENTANYL CITRATE (PF) 100 MCG/2ML IJ SOLN
25.0000 ug | INTRAMUSCULAR | Status: DC | PRN
  Administered 2023-11-15 (×3): 50 ug via INTRAVENOUS

## 2023-11-15 MED ORDER — ACETAMINOPHEN 10 MG/ML IV SOLN
INTRAVENOUS | Status: AC
  Filled 2023-11-15: qty 100

## 2023-11-15 MED ORDER — PROTAMINE SULFATE 10 MG/ML IV SOLN
INTRAVENOUS | Status: AC
  Filled 2023-11-15: qty 5

## 2023-11-15 MED ORDER — FERROUS SULFATE 325 (65 FE) MG PO TABS
325.0000 mg | ORAL_TABLET | Freq: Every day | ORAL | Status: DC
  Administered 2023-11-15 – 2023-11-20 (×6): 325 mg via ORAL
  Filled 2023-11-15 (×8): qty 1

## 2023-11-15 MED ORDER — ONDANSETRON HCL 4 MG/2ML IJ SOLN
INTRAMUSCULAR | Status: DC | PRN
  Administered 2023-11-15: 4 mg via INTRAVENOUS

## 2023-11-15 MED ORDER — HEPARIN 6000 UNIT IRRIGATION SOLUTION
Status: AC
  Filled 2023-11-15: qty 500

## 2023-11-15 MED ORDER — CEFAZOLIN SODIUM-DEXTROSE 2-4 GM/100ML-% IV SOLN
2.0000 g | Freq: Three times a day (TID) | INTRAVENOUS | Status: AC
  Administered 2023-11-15 (×2): 2 g via INTRAVENOUS
  Filled 2023-11-15 (×2): qty 100

## 2023-11-15 MED ORDER — MORPHINE SULFATE (PF) 2 MG/ML IV SOLN
2.0000 mg | INTRAVENOUS | Status: DC | PRN
  Administered 2023-11-15 – 2023-11-17 (×3): 2 mg via INTRAVENOUS
  Filled 2023-11-15 (×3): qty 1

## 2023-11-15 MED ORDER — DEXAMETHASONE SODIUM PHOSPHATE 10 MG/ML IJ SOLN
INTRAMUSCULAR | Status: DC | PRN
  Administered 2023-11-15: 5 mg via INTRAVENOUS

## 2023-11-15 MED ORDER — SODIUM CHLORIDE 0.9 % IV SOLN: INTRAVENOUS | Status: DC | PRN

## 2023-11-15 MED ORDER — INSULIN ASPART 100 UNIT/ML IJ SOLN
0.0000 [IU] | Freq: Three times a day (TID) | INTRAMUSCULAR | Status: DC
  Administered 2023-11-15: 5 [IU] via SUBCUTANEOUS
  Administered 2023-11-16: 1 [IU] via SUBCUTANEOUS
  Administered 2023-11-16: 3 [IU] via SUBCUTANEOUS
  Administered 2023-11-16: 5 [IU] via SUBCUTANEOUS
  Administered 2023-11-17 – 2023-11-18 (×2): 2 [IU] via SUBCUTANEOUS
  Administered 2023-11-18: 3 [IU] via SUBCUTANEOUS
  Administered 2023-11-19 – 2023-11-20 (×3): 2 [IU] via SUBCUTANEOUS

## 2023-11-15 MED ORDER — MAGNESIUM SULFATE 2 GM/50ML IV SOLN: 2.0000 g | Freq: Every day | INTRAVENOUS | Status: DC | PRN

## 2023-11-15 MED ORDER — MAGNESIUM 200 MG PO TABS
250.0000 mg | ORAL_TABLET | Freq: Every day | ORAL | Status: DC
  Filled 2023-11-15: qty 2

## 2023-11-15 MED ORDER — INSULIN ASPART 100 UNIT/ML IJ SOLN: 0.0000 [IU] | INTRAMUSCULAR | Status: DC | PRN

## 2023-11-15 MED ORDER — ASPIRIN 81 MG PO TBEC
81.0000 mg | DELAYED_RELEASE_TABLET | Freq: Every day | ORAL | Status: DC
  Administered 2023-11-15 – 2023-11-19 (×5): 81 mg via ORAL
  Filled 2023-11-15 (×5): qty 1

## 2023-11-15 MED ORDER — LOSARTAN POTASSIUM 50 MG PO TABS
50.0000 mg | ORAL_TABLET | Freq: Every day | ORAL | Status: DC
  Administered 2023-11-16 – 2023-11-20 (×5): 50 mg via ORAL
  Filled 2023-11-15 (×5): qty 1

## 2023-11-15 MED ORDER — EPHEDRINE SULFATE-NACL 50-0.9 MG/10ML-% IV SOSY
PREFILLED_SYRINGE | INTRAVENOUS | Status: DC | PRN
  Administered 2023-11-15 (×2): 5 mg via INTRAVENOUS

## 2023-11-15 MED ORDER — CENTRUM SILVER 50+WOMEN PO TABS: 1.0000 | ORAL_TABLET | Freq: Every day | ORAL | Status: DC

## 2023-11-15 MED ORDER — ONDANSETRON HCL 4 MG/2ML IJ SOLN: 4.0000 mg | Freq: Once | INTRAMUSCULAR | Status: DC | PRN

## 2023-11-15 MED ORDER — HEPARIN SODIUM (PORCINE) 1000 UNIT/ML IJ SOLN
INTRAMUSCULAR | Status: DC | PRN
  Administered 2023-11-15: 2000 [IU] via INTRAVENOUS
  Administered 2023-11-15: 5000 [IU] via INTRAVENOUS

## 2023-11-15 MED ORDER — GABAPENTIN 300 MG PO CAPS
600.0000 mg | ORAL_CAPSULE | Freq: Three times a day (TID) | ORAL | Status: DC
  Administered 2023-11-15 – 2023-11-20 (×15): 600 mg via ORAL
  Filled 2023-11-15 (×18): qty 2

## 2023-11-15 MED ORDER — ROCURONIUM BROMIDE 10 MG/ML (PF) SYRINGE
PREFILLED_SYRINGE | INTRAVENOUS | Status: DC | PRN
  Administered 2023-11-15: 80 mg via INTRAVENOUS

## 2023-11-15 MED ORDER — MAGNESIUM OXIDE -MG SUPPLEMENT 400 (240 MG) MG PO TABS
400.0000 mg | ORAL_TABLET | Freq: Every day | ORAL | Status: DC
  Administered 2023-11-15 – 2023-11-20 (×6): 400 mg via ORAL
  Filled 2023-11-15 (×6): qty 1

## 2023-11-15 MED ORDER — SODIUM CHLORIDE 0.9 % IV SOLN: INTRAVENOUS | Status: AC

## 2023-11-15 MED ORDER — ACETAMINOPHEN 325 MG PO TABS
325.0000 mg | ORAL_TABLET | ORAL | Status: DC | PRN
Start: 2023-11-15 — End: 2023-11-20
  Administered 2023-11-16 – 2023-11-19 (×3): 650 mg via ORAL
  Filled 2023-11-15 (×3): qty 2

## 2023-11-15 MED ORDER — ALUM & MAG HYDROXIDE-SIMETH 200-200-20 MG/5ML PO SUSP
15.0000 mL | ORAL | Status: DC | PRN
Start: 2023-11-15 — End: 2023-11-20

## 2023-11-15 MED ORDER — PROPOFOL 10 MG/ML IV BOLUS
INTRAVENOUS | Status: AC
  Filled 2023-11-15: qty 20

## 2023-11-15 MED ORDER — METOPROLOL SUCCINATE ER 100 MG PO TB24
100.0000 mg | ORAL_TABLET | Freq: Every day | ORAL | Status: DC
Start: 2023-11-16 — End: 2023-11-20
  Administered 2023-11-16 – 2023-11-20 (×5): 100 mg via ORAL
  Filled 2023-11-15 (×5): qty 1

## 2023-11-15 MED ORDER — HEMOSTATIC AGENTS (NO CHARGE) OPTIME
TOPICAL | Status: DC | PRN
  Administered 2023-11-15: 1 via TOPICAL

## 2023-11-15 MED ORDER — 0.9 % SODIUM CHLORIDE (POUR BTL) OPTIME
TOPICAL | Status: DC | PRN
  Administered 2023-11-15: 2000 mL

## 2023-11-15 MED ORDER — DOCUSATE SODIUM 100 MG PO CAPS
100.0000 mg | ORAL_CAPSULE | Freq: Every day | ORAL | Status: DC
Start: 2023-11-16 — End: 2023-11-20
  Administered 2023-11-16 – 2023-11-20 (×5): 100 mg via ORAL
  Filled 2023-11-15 (×5): qty 1

## 2023-11-15 MED ORDER — POTASSIUM CHLORIDE CRYS ER 20 MEQ PO TBCR: 20.0000 meq | EXTENDED_RELEASE_TABLET | Freq: Every day | ORAL | Status: DC | PRN

## 2023-11-15 MED ORDER — GUAIFENESIN-DM 100-10 MG/5ML PO SYRP: 15.0000 mL | ORAL_SOLUTION | ORAL | Status: DC | PRN

## 2023-11-15 MED ORDER — MIDAZOLAM HCL 2 MG/2ML IJ SOLN
INTRAMUSCULAR | Status: AC
Start: 2023-11-15 — End: ?
  Filled 2023-11-15: qty 2

## 2023-11-15 MED ORDER — TIMOLOL MALEATE 0.5 % OP SOLN
1.0000 [drp] | Freq: Every morning | OPHTHALMIC | Status: DC
  Administered 2023-11-16 – 2023-11-20 (×5): 1 [drp] via OPHTHALMIC
  Filled 2023-11-15: qty 5

## 2023-11-15 MED ORDER — FENTANYL CITRATE (PF) 100 MCG/2ML IJ SOLN
INTRAMUSCULAR | Status: AC
  Filled 2023-11-15: qty 2

## 2023-11-15 MED ORDER — LIDOCAINE 2% (20 MG/ML) 5 ML SYRINGE
INTRAMUSCULAR | Status: DC | PRN
  Administered 2023-11-15: 100 mg via INTRAVENOUS

## 2023-11-15 MED ORDER — PROPOFOL 10 MG/ML IV BOLUS
INTRAVENOUS | Status: DC | PRN
  Administered 2023-11-15: 50 mg via INTRAVENOUS

## 2023-11-15 MED ORDER — CEFAZOLIN SODIUM-DEXTROSE 2-4 GM/100ML-% IV SOLN
2.0000 g | INTRAVENOUS | Status: AC
  Administered 2023-11-15: 2 g via INTRAVENOUS
  Filled 2023-11-15: qty 100

## 2023-11-15 MED ORDER — ROCURONIUM BROMIDE 10 MG/ML (PF) SYRINGE
PREFILLED_SYRINGE | INTRAVENOUS | Status: AC
  Filled 2023-11-15: qty 10

## 2023-11-15 MED ORDER — CLOPIDOGREL BISULFATE 75 MG PO TABS
75.0000 mg | ORAL_TABLET | Freq: Every day | ORAL | Status: DC
  Administered 2023-11-16 – 2023-11-20 (×5): 75 mg via ORAL
  Filled 2023-11-15 (×5): qty 1

## 2023-11-15 MED ORDER — PHENYLEPHRINE 80 MCG/ML (10ML) SYRINGE FOR IV PUSH (FOR BLOOD PRESSURE SUPPORT): PREFILLED_SYRINGE | INTRAVENOUS | Status: DC | PRN

## 2023-11-15 MED ORDER — OXYBUTYNIN CHLORIDE ER 10 MG PO TB24
10.0000 mg | ORAL_TABLET | Freq: Every day | ORAL | Status: DC
  Administered 2023-11-16 – 2023-11-20 (×5): 10 mg via ORAL
  Filled 2023-11-15 (×5): qty 1

## 2023-11-15 MED ORDER — SUGAMMADEX SODIUM 200 MG/2ML IV SOLN
INTRAVENOUS | Status: DC | PRN
  Administered 2023-11-15: 100 mg via INTRAVENOUS

## 2023-11-15 MED ORDER — HYDRALAZINE HCL 20 MG/ML IJ SOLN: 5.0000 mg | INTRAMUSCULAR | Status: DC | PRN

## 2023-11-15 MED ORDER — ORAL CARE MOUTH RINSE: 15.0000 mL | Freq: Once | OROMUCOSAL | Status: AC

## 2023-11-15 MED ORDER — OXYCODONE HCL 5 MG PO TABS
ORAL_TABLET | ORAL | Status: AC
  Filled 2023-11-15: qty 1

## 2023-11-15 MED ORDER — DULOXETINE HCL 60 MG PO CPEP
60.0000 mg | ORAL_CAPSULE | Freq: Every day | ORAL | Status: DC
  Administered 2023-11-16 – 2023-11-20 (×5): 60 mg via ORAL
  Filled 2023-11-15 (×5): qty 1

## 2023-11-15 MED ORDER — BISACODYL 10 MG RE SUPP: 10.0000 mg | Freq: Every day | RECTAL | Status: DC | PRN

## 2023-11-15 MED ORDER — CHLORHEXIDINE GLUCONATE CLOTH 2 % EX PADS: 6.0000 | MEDICATED_PAD | Freq: Once | CUTANEOUS | Status: DC

## 2023-11-15 MED ORDER — DIPHENHYDRAMINE-APAP (SLEEP) 25-500 MG PO TABS: 1.0000 | ORAL_TABLET | Freq: Every evening | ORAL | Status: DC | PRN

## 2023-11-15 MED ORDER — OXYCODONE HCL 5 MG PO TABS
5.0000 mg | ORAL_TABLET | Freq: Once | ORAL | Status: AC | PRN
  Administered 2023-11-15: 5 mg via ORAL

## 2023-11-15 MED ORDER — ONDANSETRON HCL 4 MG/2ML IJ SOLN
INTRAMUSCULAR | Status: AC
  Filled 2023-11-15: qty 2

## 2023-11-15 MED ORDER — SODIUM CHLORIDE 0.9 % IV SOLN: 500.0000 mL | Freq: Once | INTRAVENOUS | Status: DC | PRN

## 2023-11-15 MED ORDER — ATORVASTATIN CALCIUM 80 MG PO TABS
80.0000 mg | ORAL_TABLET | Freq: Every day | ORAL | Status: DC
  Administered 2023-11-16 – 2023-11-20 (×5): 80 mg via ORAL
  Filled 2023-11-15 (×5): qty 1

## 2023-11-15 MED ORDER — METFORMIN HCL ER 500 MG PO TB24
500.0000 mg | ORAL_TABLET | Freq: Two times a day (BID) | ORAL | Status: DC
  Administered 2023-11-15 – 2023-11-20 (×10): 500 mg via ORAL
  Filled 2023-11-15 (×10): qty 1

## 2023-11-15 MED ORDER — PHENOL 1.4 % MT LIQD: 1.0000 | OROMUCOSAL | Status: DC | PRN

## 2023-11-15 MED ORDER — HEPARIN 6000 UNIT IRRIGATION SOLUTION
Status: DC | PRN
  Administered 2023-11-15: 1

## 2023-11-15 MED ORDER — TRAZODONE HCL 50 MG PO TABS
75.0000 mg | ORAL_TABLET | Freq: Every evening | ORAL | Status: DC | PRN
  Administered 2023-11-16 – 2023-11-19 (×4): 75 mg via ORAL
  Filled 2023-11-15 (×4): qty 2

## 2023-11-15 MED ORDER — EPHEDRINE 5 MG/ML INJ
INTRAVENOUS | Status: AC
  Filled 2023-11-15: qty 5

## 2023-11-15 MED ORDER — ACETAMINOPHEN 10 MG/ML IV SOLN
1000.0000 mg | Freq: Once | INTRAVENOUS | Status: DC | PRN
Start: 2023-11-15 — End: 2023-11-15
  Administered 2023-11-15: 1000 mg via INTRAVENOUS

## 2023-11-15 MED ORDER — CHLORHEXIDINE GLUCONATE 0.12 % MT SOLN
OROMUCOSAL | Status: AC
  Administered 2023-11-15: 15 mL via OROMUCOSAL
  Filled 2023-11-15: qty 15

## 2023-11-15 MED ORDER — LABETALOL HCL 5 MG/ML IV SOLN: 10.0000 mg | INTRAVENOUS | Status: DC | PRN

## 2023-11-15 MED ORDER — OXYCODONE HCL 5 MG/5ML PO SOLN: 5.0000 mg | Freq: Once | ORAL | Status: AC | PRN

## 2023-11-15 MED ORDER — POLYETHYLENE GLYCOL 3350 17 G PO PACK: 17.0000 g | PACK | Freq: Every day | ORAL | Status: DC | PRN

## 2023-11-15 MED ORDER — MIDAZOLAM HCL 2 MG/2ML IJ SOLN
INTRAMUSCULAR | Status: DC | PRN
  Administered 2023-11-15: 1 mg via INTRAVENOUS

## 2023-11-15 MED ORDER — ACETAMINOPHEN 650 MG RE SUPP: 325.0000 mg | RECTAL | Status: DC | PRN

## 2023-11-15 SURGICAL SUPPLY — 40 items
BAG COUNTER SPONGE SURGICOUNT (BAG) ×2 IMPLANT
BENZOIN TINCTURE PRP APPL 2/3 (GAUZE/BANDAGES/DRESSINGS) ×2 IMPLANT
CANISTER SUCT 3000ML PPV (MISCELLANEOUS) ×2 IMPLANT
CANNULA VESSEL 3MM 2 BLNT TIP (CANNULA) ×4 IMPLANT
CHLORAPREP W/TINT 26 (MISCELLANEOUS) ×2 IMPLANT
CLIP LIGATING EXTRA SM BLUE (MISCELLANEOUS) ×2 IMPLANT
CLIP TI MEDIUM 24 (CLIP) ×2 IMPLANT
CLIP TI WIDE RED SMALL 24 (CLIP) ×2 IMPLANT
DRAIN SNY 10X20 3/4 PERF (WOUND CARE) IMPLANT
DRAPE INCISE IOBAN 66X45 STRL (DRAPES) ×2 IMPLANT
ELECT REM PT RETURN 9FT ADLT (ELECTROSURGICAL) ×1
ELECTRODE REM PT RTRN 9FT ADLT (ELECTROSURGICAL) ×2 IMPLANT
EVACUATOR SILICONE 100CC (DRAIN) IMPLANT
GAUZE 4X4 16PLY ~~LOC~~+RFID DBL (SPONGE) IMPLANT
GLOVE BIO SURGEON STRL SZ8 (GLOVE) ×4 IMPLANT
GLOVE SURG UNDER LTX SZ8 (GLOVE) ×2 IMPLANT
GOWN STRL REUS W/ TWL LRG LVL3 (GOWN DISPOSABLE) ×6 IMPLANT
GOWN STRL REUS W/TWL XL LVL3 (GOWN DISPOSABLE) ×2 IMPLANT
GRAFT PROPATEN W/RING 6X80X60 (Vascular Products) IMPLANT
HEMOSTAT SNOW SURGICEL 2X4 (HEMOSTASIS) IMPLANT
INSERT FOGARTY SM (MISCELLANEOUS) IMPLANT
KIT BASIN OR (CUSTOM PROCEDURE TRAY) ×2 IMPLANT
KIT TURNOVER KIT B (KITS) ×2 IMPLANT
LIGACLIP MED TITANIUM (CLIP) ×2 IMPLANT
NS IRRIG 1000ML POUR BTL (IV SOLUTION) ×4 IMPLANT
PACK PERIPHERAL VASCULAR (CUSTOM PROCEDURE TRAY) ×2 IMPLANT
PAD ARMBOARD 7.5X6 YLW CONV (MISCELLANEOUS) ×4 IMPLANT
STRIP CLOSURE SKIN 1/2X4 (GAUZE/BANDAGES/DRESSINGS) ×4 IMPLANT
STRIP CLOSURE SKIN 1/4X3 (GAUZE/BANDAGES/DRESSINGS) IMPLANT
SUT MNCRL AB 4-0 PS2 18 (SUTURE) ×6 IMPLANT
SUT PROLENE 5 0 C 1 24 (SUTURE) ×6 IMPLANT
SUT PROLENE 5 0 C 1 36 (SUTURE) IMPLANT
SUT PROLENE 6 0 BV (SUTURE) ×2 IMPLANT
SUT SILK 2 0 PERMA HAND 18 BK (SUTURE) ×2 IMPLANT
SUT SILK 3-0 18XBRD TIE 12 (SUTURE) IMPLANT
SUT VIC AB 2-0 CT1 TAPERPNT 27 (SUTURE) ×6 IMPLANT
SUT VIC AB 3-0 SH 27X BRD (SUTURE) ×6 IMPLANT
TOWEL GREEN STERILE (TOWEL DISPOSABLE) ×2 IMPLANT
TRAY FOLEY MTR SLVR 16FR STAT (SET/KITS/TRAYS/PACK) ×2 IMPLANT
WATER STERILE IRR 1000ML POUR (IV SOLUTION) ×2 IMPLANT

## 2023-11-15 NOTE — H&P (Signed)
[facility] OF   ASSESSMENT / PLAN: Jill Higgins is a 77 y.o. female with complex personal vascular history with bilateral lower extremity intervention.  Recent angiography performed to evaluate right lower extremity possible inflow stenosis unrevealing but was complicated by flow-limiting arterial dissection.  This was managed intraoperatively with covered stenting 04/20/23.  Recommend:  Abstinence from all tobacco products. Blood glucose control with goal A1c < 7%. Blood pressure control with goal blood pressure < 140/90 mmHg. Lipid reduction therapy with goal LDL-C <100 mg/dL. Aspirin 81mg  PO QD.  Clopidogrel 75mg  PO QD. Atorvastatin 40-80mg  PO QD (or other "high intensity" statin therapy).  Her right external iliac artery stenting has thrombosed.  This is causing short distance claudication.  Plan right axilo-femoral bypass today in OR.  CHIEF COMPLAINT: follow up angiogram  HISTORY OF PRESENT ILLNESS: Jill Higgins is a 77 y.o. female with complex past vascular history who returns to clinic after angiogram done for right lower extremity pain.  Preangiogram noninvasive testing suggested possible right external iliac artery stenosis and possible left leg bypass stenosis.  Neither was identified on angiogram.  Her angiogram was complicated by closure device malfunction which was successfully managed with covered stenting.  The patient is doing reasonably well on my evaluation.  She continues to have right hip and knee pain.  She recently sustained a fall and had some x-rays made.  The pain is not typical of claudication.  She describes point tenderness in her hip.  I do not think she walks fast enough to claudicate.  She does not have any rest pain in her foot.  She has no ulcers.  09/25/23: Patient returns to clinic for follow-up.  She was initially doing well after covered stenting of right external iliac artery dissection, but has unfortunately developed short  distance claudication.  The patient has no pain at rest.  She has no ulcers about her feet.  Her noninvasive studies were performed earlier today and reviewed in person.  VASCULAR SURGICAL HISTORY:  Left fem-popliteal vein bypass in December of 2014 by Dr. Darrick Penna, a Right femoral to above knee popliteal vein bypass in March of 2017 Redo right femoral endarterectomy in July of 2019 Angiogram, right external iliac artery stenting May 2024   Past Medical History:  Diagnosis Date   Bronchitis    Bulging disc    in neck   Depression with anxiety    Diabetes mellitus    Type II   Fibromyalgia    pt. denies   GERD (gastroesophageal reflux disease)    Glaucoma    History of bronchitis    Hyperlipidemia    Hypertension    Overactive bladder    Peripheral vascular disease (HCC)    Pneumonia    Sleep apnea    recent test negative for sleep apnea   Tobacco abuse    Urinary frequency     Past Surgical History:  Procedure Laterality Date   ABDOMINAL AORTAGRAM N/A 08/15/2013   Procedure: ABDOMINAL Ronny Flurry;  Surgeon: Sherren Kerns, MD;  Location: [facility] CATH LAB;  Service: Cardiovascular;  Laterality: N/A;   ABDOMINAL AORTAGRAM N/A 06/19/2014   Procedure: ABDOMINAL Ronny Flurry;  Surgeon: Sherren Kerns, MD;  Location: [facility] CATH LAB;  Service: Cardiovascular;  Laterality: N/A;   ABDOMINAL AORTOGRAM W/LOWER EXTREMITY N/A 05/10/2018   Procedure: ABDOMINAL AORTOGRAM W/LOWER EXTREMITY;  Surgeon: Sherren Kerns, MD;  Location: [facility] INVASIVE CV LAB;  Service: Cardiovascular;  Laterality: N/A;  bilateral   ABDOMINAL AORTOGRAM  W/LOWER EXTREMITY N/A 04/20/2023   Procedure: ABDOMINAL AORTOGRAM W/LOWER EXTREMITY;  Surgeon: Leonie Douglas, MD;  Location: [facility] INVASIVE CV LAB;  Service: Cardiovascular;  Laterality: N/A;   Aortogram w/ PTA  05/14/08,  11-04-10   Bilateral aortogram w/ bilateral  SFA PTA  stenting    BREAST SURGERY Right    boil removal   BUNIONECTOMY     L foot in the 1980s    COLONOSCOPY     2014   DILATION AND CURETTAGE OF UTERUS     X4   ENDARTERECTOMY FEMORAL Right 06/03/2018   Procedure: RIGHT FEMORAL ENDARTERECTOMY;  Surgeon: Sherren Kerns, MD;  Location: [facility] OR;  Service: Vascular;  Laterality: Right;   Epidural shots in neck      ESOPHAGOGASTRODUODENOSCOPY (EGD) WITH PROPOFOL N/A 09/15/2019   Procedure: ESOPHAGOGASTRODUODENOSCOPY (EGD) WITH PROPOFOL;  Surgeon: Willis Modena, MD;  Location: [facility] ENDOSCOPY;  Service: Endoscopy;  Laterality: N/A;   EUS N/A 09/15/2019   Procedure: UPPER ENDOSCOPIC ULTRASOUND (EUS) LINEAR;  Surgeon: Willis Modena, MD;  Location: [facility] ENDOSCOPY;  Service: Endoscopy;  Laterality: N/A;   EYE SURGERY Bilateral 2016   cataract removal   FEMORAL-POPLITEAL BYPASS GRAFT Left 10/21/2013   Procedure: LEFT FEMORAL-POPLITEAL ARTERY BYPASS WITH SAPHENOUS VEIN GRAFT , POPLITEAL ENDARTERECTOMY ,INTRAOPERATIVE ARTERIOGRAM, vein patch angioplasty to popliteal artery;  Surgeon: Sherren Kerns, MD;  Location: [facility] OR;  Service: Vascular;  Laterality: Left;   FEMORAL-POPLITEAL BYPASS GRAFT Right 02/08/2016   Procedure: Right FEMORAL- to Above Knee POPLITEAL ARTERY Bypass Graft with reversed saphenous vein and Common Femoral Endarterectomy  with profundoplasty;  Surgeon: Sherren Kerns, MD;  Location: [facility] OR;  Service: Vascular;  Laterality: Right;   FINE NEEDLE ASPIRATION N/A 09/15/2019   Procedure: FINE NEEDLE ASPIRATION (FNA) LINEAR;  Surgeon: Willis Modena, MD;  Location: [facility] ENDOSCOPY;  Service: Endoscopy;  Laterality: N/A;   GROIN DEBRIDEMENT Left 10/28/2013   Procedure: left inner thigh DEBRIDEMENT;  Surgeon: Sherren Kerns, MD;  Location: [facility] OR;  Service: Vascular;  Laterality: Left;   HAND SURGERY Left 2010   LOWER EXTREMITY ANGIOGRAM Bilateral 12/24/2015   Procedure: Lower Extremity Angiogram;  Surgeon: Sherren Kerns, MD;  Location: [facility] INVASIVE CV LAB;  Service: Cardiovascular;  Laterality: Bilateral;   PATCH ANGIOPLASTY Right  06/03/2018   Procedure: PATCH ANGIOPLASTY USING A XENOSURE 1CM X 14CM BIOLOGIC PATCH;  Surgeon: Sherren Kerns, MD;  Location: [facility] OR;  Service: Vascular;  Laterality: Right;   PERIPHERAL VASCULAR CATHETERIZATION N/A 12/24/2015   Procedure: Abdominal Aortogram;  Surgeon: Sherren Kerns, MD;  Location: [facility] INVASIVE CV LAB;  Service: Cardiovascular;  Laterality: N/A;   PERIPHERAL VASCULAR CATHETERIZATION Left 12/24/2015   Procedure: Peripheral Vascular Balloon Angioplasty;  Surgeon: Sherren Kerns, MD;  Location: [facility] INVASIVE CV LAB;  Service: Cardiovascular;  Laterality: Left;  drug coated balloon   PERIPHERAL VASCULAR CATHETERIZATION N/A 06/30/2016   Procedure: Abdominal Aortogram;  Surgeon: Sherren Kerns, MD;  Location: [facility] INVASIVE CV LAB;  Service: Cardiovascular;  Laterality: N/A;   PERIPHERAL VASCULAR CATHETERIZATION Bilateral 06/30/2016   Procedure: Lower Extremity Angiography;  Surgeon: Sherren Kerns, MD;  Location: [facility] INVASIVE CV LAB;  Service: Cardiovascular;  Laterality: Bilateral;   TONSILLECTOMY      Family History  Problem Relation Age of Onset   Heart disease Father    Heart attack Father        MI at age 23   Hypertension Mother    Alzheimer's disease Mother    Hypertension  Sister    Diabetes Brother    Hyperlipidemia Brother    Hypertension Brother    Heart disease Brother    Heart attack Brother    Breast cancer Maternal Aunt     Social History   Socioeconomic History   Marital status: Single    Spouse name: Not on file   Number of children: 1   Years of education: Not on file   Highest education level: Not on file  Occupational History   Occupation: Retired    Associate Professor: Scientist, water quality  Tobacco Use   Smoking status: Former    Current packs/day: 0.25    Average packs/day: 0.3 packs/day for 0.4 years (0.1 ttl pk-yrs)    Types: Cigarettes    Start date: 06/2023    Quit date: 10/2014    Passive exposure: Never   Smokeless tobacco: Never   Tobacco comments:     pt states that she is using the E cig only   Vaping Use   Vaping status: Some Days  Substance and Sexual Activity   Alcohol use: Yes    Alcohol/week: 0.0 - 1.0 standard drinks of alcohol    Comment: Occasional; 10-22 rarely    Drug use: No   Sexual activity: Not on file  Other Topics Concern   Not on file  Social History Narrative   Drinks about 2 cups of coffee a day, and some tea    Social Drivers of Corporate investment banker Strain: Not on file  Food Insecurity: No Food Insecurity (04/20/2023)   Hunger Vital Sign    Worried About Running Out of Food in the Last Year: Never true    Ran Out of Food in the Last Year: Never true  Transportation Needs: No Transportation Needs (04/20/2023)   PRAPARE - Administrator, Civil Service (Medical): No    Lack of Transportation (Non-Medical): No  Physical Activity: Not on file  Stress: Not on file  Social Connections: Not on file  Intimate Partner Violence: Not At Risk (04/20/2023)   Humiliation, Afraid, Rape, and Kick questionnaire    Fear of Current or Ex-Partner: No    Emotionally Abused: No    Physically Abused: No    Sexually Abused: No    Allergies  Allergen Reactions   Wound Dressing Adhesive Rash and Other (See Comments)    Pt states that tape and electrodes leave red "scars" on her skin and her skin is very sensitive.  Paper tape ok to use    Current Facility-Administered Medications  Medication Dose Route Frequency Provider Last Rate Last Admin   0.9 %  sodium chloride infusion   Intravenous Continuous Leonie Douglas, MD       ceFAZolin (ANCEF) IVPB 2g/100 mL premix  2 g Intravenous 30 min Pre-Op Leonie Douglas, MD       Chlorhexidine Gluconate Cloth 2 % PADS 6 each  6 each Topical Once Leonie Douglas, MD       And   Chlorhexidine Gluconate Cloth 2 % PADS 6 each  6 each Topical Once Leonie Douglas, MD       insulin aspart (novoLOG) injection 0-7 Units  0-7 Units Subcutaneous Q2H PRN Mariann Barter, MD        sodium chloride flush (NS) 0.9 % injection 3-10 mL  3-10 mL Intravenous Q12H Mariann Barter, MD        PHYSICAL EXAM Vitals:   11/15/23 0601  BP: 137/70  Pulse: Marland Kitchen)  54  Resp: 16  Temp: 98.5 F (36.9 C)  TempSrc: Oral  SpO2: 100%  Weight: 50.3 kg  Height: 5\' 3"  (1.6 m)     Thin elderly woman in no acute distress Regular rate and rhythm Unlabored breathing No palpable pedal pulses   PERTINENT LABORATORY AND RADIOLOGIC DATA  Most recent CBC    Latest Ref Rng & Units 11/15/2023    7:19 AM 10/23/2023    2:51 PM 04/27/2023    4:14 PM  CBC  WBC 4.0 - 10.5 K/uL  9.6    Hemoglobin 12.0 - 15.0 g/dL 19.1  47.8  29.5   Hematocrit 36.0 - 46.0 % 39.0  40.1  35.0   Platelets 150 - 400 K/uL  291       Most recent CMP    Latest Ref Rng & Units 11/15/2023    7:19 AM 10/23/2023    2:51 PM 09/27/2023    5:09 PM  CMP  Glucose 70 - 99 mg/dL 621  308    BUN 8 - 23 mg/dL 10  8    Creatinine 6.57 - 1.00 mg/dL 8.46  9.62  9.52   Sodium 135 - 145 mmol/L 139  140    Potassium 3.5 - 5.1 mmol/L 4.2  3.2    Chloride 98 - 111 mmol/L 106  106    CO2 22 - 32 mmol/L  26    Calcium 8.9 - 10.3 mg/dL  8.6    Total Protein 6.5 - 8.1 g/dL  6.2    Total Bilirubin <1.2 mg/dL  0.4    Alkaline Phos 38 - 126 U/L  72    AST 15 - 41 U/L  23    ALT 0 - 44 U/L  20      Renal function Estimated Creatinine Clearance: 40.9 mL/min (by C-G formula based on SCr of 0.9 mg/dL).  Hemoglobin A1C  Date Value  09/27/2023 9.5 % (A)  05/13/2020 8.5    LDL Chol Calc (NIH)  Date Value Ref Range Status  02/08/2022 21 0 - 99 mg/dL Final   LDL Cholesterol  Date Value Ref Range Status  04/21/2023 38 0 - 99 mg/dL Final    Comment:           Total Cholesterol/HDL:CHD Risk Coronary Heart Disease Risk Table                     Men   Women  1/2 Average Risk   3.4   3.3  Average Risk       5.0   4.4  2 X Average Risk   9.6   7.1  3 X Average Risk  23.4   11.0        Use the calculated Patient  Ratio above and the CHD Risk Table to determine the patient's CHD Risk.        ATP III CLASSIFICATION (LDL):  <100     mg/dL   Optimal  841-324  mg/dL   Near or Above                    Optimal  130-159  mg/dL   Borderline  401-027  mg/dL   High  >253     mg/dL   Very High Performed at [facility], [address] [place], [place] [address]       Stenosis:  Patent right CIA/stent with no stenosis. The EIA/stent appears occluded  with a collateral  vessel present.    +-------+-----------+-----------+------------+------------+  ABI/TBIToday's ABIToday's TBIPrevious ABIPrevious TBI  +-------+-----------+-----------+------------+------------+  Right 0.5        0.22       0.95        0.88          +-------+-----------+-----------+------------+------------+  Left  1.06       0.6        0.99        0.61          +-------+-----------+-----------+------------+------------+   Rande Brunt. Lenell Antu, MD FACS [facility] Phone Number: [phone] 11/15/2023 7:24 AM   Total time spent on preparing this encounter including chart review, data review, collecting history, examining the patient, coordinating care for this established patient, 30 minutes.  Portions of this report may have been transcribed using voice recognition software.  Every effort has been made to ensure accuracy; however, inadvertent computerized transcription errors may still be present.

## 2023-11-15 NOTE — Progress Notes (Signed)
  Day of Surgery Note  Late entry:  Subjective:  resting in recovery-says she is sore   Vitals:   11/15/23 1100 11/15/23 1115  BP: 121/76 113/82  Pulse: (!) 52 (!) 55  Resp: (!) 21 12  Temp:    SpO2: 99% 95%    Incisions:   right clavicular and right groin incisions with bandages that are clean Extremities:  brisk doppler flow right foot Cardiac:  regular Lungs:  non labored    Assessment/Plan:  This is a 77 y.o. female who is s/p  Right axillary to femoral bypass   -pt doing well in recovery and incisions look fine.  She has brisk doppler flow right foot. -BP meds on hold for soft BP.  Reassess in am. -DM medications will need to be restarted tomorrow if blood sugar allows.     Doreatha Massed, PA-C 11/15/2023 2:08 PM [phone]

## 2023-11-15 NOTE — Plan of Care (Signed)
  Problem: Education: Goal: Knowledge of General Education information will improve Description: Including pain rating scale, medication(s)/side effects and non-pharmacologic comfort measures Outcome: Progressing   Problem: Activity: Goal: Risk for activity intolerance will decrease Outcome: Progressing   Problem: Pain Management: Goal: General experience of comfort will improve Outcome: Progressing

## 2023-11-15 NOTE — Anesthesia Preprocedure Evaluation (Signed)
Anesthesia Evaluation  Patient identified by MRN, date of birth, ID band Patient awake    Reviewed: Allergy & Precautions, NPO status , Patient's Chart, lab work & pertinent test results, reviewed documented beta blocker date and time   History of Anesthesia Complications Negative for: history of anesthetic complications  Airway Mallampati: III  TM Distance: >3 FB     Dental  (+) Missing   Pulmonary sleep apnea , pneumonia, neg COPD, Current Smoker and Patient abstained from smoking.   breath sounds clear to auscultation       Cardiovascular hypertension, (-) angina + Peripheral Vascular Disease  (-) CAD and (-) Past MI (-) dysrhythmias (-) pacemaker Rhythm:Regular Rate:Normal     Neuro/Psych neg Seizures PSYCHIATRIC DISORDERS Anxiety Depression     Neuromuscular disease    GI/Hepatic ,GERD  ,,(+) neg Cirrhosis        Endo/Other  diabetes, Type 2, Insulin Dependent    Renal/GU Renal disease     Musculoskeletal  (+) Arthritis ,  Fibromyalgia -  Abdominal   Peds  Hematology   Anesthesia Other Findings   Reproductive/Obstetrics                             Anesthesia Physical Anesthesia Plan  ASA: 3  Anesthesia Plan: General   Post-op Pain Management:    Induction: Intravenous  PONV Risk Score and Plan: 1 and Ondansetron and Dexamethasone  Airway Management Planned:   Additional Equipment: Arterial line  Intra-op Plan:   Post-operative Plan: Extubation in OR  Informed Consent: I have reviewed the patients History and Physical, chart, labs and discussed the procedure including the risks, benefits and alternatives for the proposed anesthesia with the patient or authorized representative who has indicated his/her understanding and acceptance.     Dental advisory given  Plan Discussed with: CRNA  Anesthesia Plan Comments:        Anesthesia Quick Evaluation

## 2023-11-15 NOTE — Evaluation (Signed)
Physical Therapy Evaluation Patient Details Name: Jill Higgins MRN: [id number] DOB: [birth year] Today's Date: 11/15/2023  History of Present Illness  Pt presented to [facility] for scheduled R axillary femoral bypass. PMH: bronchitis, bulging disc, depression w/anxiety, DM II, fibromyalgia, GERD, glaucoma, hyperlipidemia, HTN, PVD, sleep apnea  Clinical Impression  Pt is presenting slightly below baseline level of functioning. Pt currently requires min A for bed mobility and  CGA for sit to stand/gait with RW. Previously pt was Mod I with RW for all functional mobility. Pt has a supportive family and support group. Due to pt current functional status, home set up and available assistance at home recommending skilled physical therapy services 3x/week in order to address strength, balance and functional mobility to decrease risk for falls, injury and re-hospitalization.         If plan is discharge home, recommend the following: Help with stairs or ramp for entrance;Assist for transportation;Assistance with cooking/housework     Equipment Recommendations None recommended by PT     Functional Status Assessment Patient has had a recent decline in their functional status and demonstrates the ability to make significant improvements in function in a reasonable and predictable amount of time.     Precautions / Restrictions Precautions Precautions: Fall Restrictions Weight Bearing Restrictions Per Provider Order: No      Mobility  Bed Mobility Overal bed mobility: Needs Assistance Bed Mobility: Supine to Sit, Sit to Supine     Supine to sit: Min assist Sit to supine: Min assist   General bed mobility comments: Pt requires Min A for stabilizing trunk into midline for supine to sitting and for bil LE for sitting to supine.    Transfers Overall transfer level: Needs assistance Equipment used: Rolling walker (2 wheels) Transfers: Sit to/from Stand Sit to Stand: Contact guard assist            General transfer comment: CGA for steadying at EOB with RW. Once in standing pt was steady with light UE support on RW.    Ambulation/Gait Ambulation/Gait assistance: Contact guard assist Gait Distance (Feet): 25 Feet Assistive device: Rolling walker (2 wheels) Gait Pattern/deviations: Step-through pattern, Decreased stride length Gait velocity: decreased Gait velocity interpretation: <1.31 ft/sec, indicative of household ambulator   General Gait Details: slow steady pace       Balance Overall balance assessment: Mild deficits observed, not formally tested         Pertinent Vitals/Pain Pain Assessment Pain Assessment: 0-10 Pain Score: 3  Pain Location: RLE Pain Descriptors / Indicators: Aching Pain Intervention(s): Monitored during session    Home Living Family/patient expects to be discharged to:: Private residence Living Arrangements: Alone Available Help at Discharge: Family;Available PRN/intermittently Type of Home: Apartment Home Access: Level entry       Home Layout: One level Home Equipment: Agricultural consultant (2 wheels);Tub bench;Grab bars - tub/shower;Cane - single point;BSC/3in1 Additional Comments: Pt denies falls in the past 6 months.    Prior Function Prior Level of Function : Independent/Modified Independent;Driving             Mobility Comments: Pt states that occasionally she uses the cane if her pain is really severe. ADLs Comments: Pt reports independent/Mod I  with ADL's and IADL's.     Extremity/Trunk Assessment   Upper Extremity Assessment Upper Extremity Assessment: Overall WFL for tasks assessed    Lower Extremity Assessment Lower Extremity Assessment: RLE deficits/detail;Generalized weakness RLE Deficits / Details: pain from recent surgery    Cervical / Trunk Assessment  Cervical / Trunk Assessment: Normal  Communication   Communication Communication: No apparent difficulties Cueing Techniques: Verbal cues  Cognition  Arousal: Alert Behavior During Therapy: WFL for tasks assessed/performed Overall Cognitive Status: Within Functional Limits for tasks assessed      General Comments General comments (skin integrity, edema, etc.): Pt incision site had some sero-sangiounous drainage but was not saturated ~MOd amount        Assessment/Plan    PT Assessment Patient needs continued PT services  PT Problem List Decreased balance;Decreased activity tolerance;Decreased mobility;Pain       PT Treatment Interventions DME instruction;Therapeutic activities;Gait training;Therapeutic exercise;Functional mobility training;Balance training;Stair training;Patient/family education    PT Goals (Current goals can be found in the Care Plan section)  Acute Rehab PT Goals Patient Stated Goal: to return home PT Goal Formulation: With patient Time For Goal Achievement: 11/29/23 Potential to Achieve Goals: Good    Frequency Min 1X/week        AM-PAC PT "6 Clicks" Mobility  Outcome Measure Help needed turning from your back to your side while in a flat bed without using bedrails?: A Little Help needed moving from lying on your back to sitting on the side of a flat bed without using bedrails?: A Little Help needed moving to and from a bed to a chair (including a wheelchair)?: A Little Help needed standing up from a chair using your arms (e.g., wheelchair or bedside chair)?: A Little Help needed to walk in hospital room?: A Little Help needed climbing 3-5 steps with a railing? : A Little 6 Click Score: 18    End of Session   Activity Tolerance: Patient tolerated treatment well Patient left: in bed;with call bell/phone within reach;with bed alarm set Nurse Communication: Mobility status PT Visit Diagnosis: Unsteadiness on feet (R26.81);Other abnormalities of gait and mobility (R26.89)    Time: 1610-9604 PT Time Calculation (min) (ACUTE ONLY): 29 min   Charges:   PT Evaluation $PT Eval Low Complexity: 1  Low PT Treatments $Therapeutic Activity: 8-22 mins PT General Charges $$ ACUTE PT VISIT: 1 Visit        Harrel Carina, DPT, CLT  Acute Rehabilitation Services Office: [phone] (Secure chat preferred)   Claudia Desanctis 11/15/2023, 5:41 PM

## 2023-11-15 NOTE — Anesthesia Procedure Notes (Signed)
Arterial Line Insertion Start/End12/26/2024 7:22 AM, 11/15/2023 7:36 AM Performed by: Margarita Rana, CRNA, CRNA  Patient location: Pre-op. Preanesthetic checklist: patient identified, IV checked, site marked, risks and benefits discussed, surgical consent, monitors and equipment checked, pre-op evaluation, timeout performed and anesthesia consent Lidocaine 1% used for infiltration Left, radial was placed Catheter size: 20 G Hand hygiene performed   Attempts: 2 (1st attempt by Renaee Munda, CRNA) Procedure performed without using ultrasound guided technique. Following insertion, dressing applied. Post procedure assessment: normal  Patient tolerated the procedure well with no immediate complications.

## 2023-11-15 NOTE — Discharge Instructions (Signed)

## 2023-11-15 NOTE — Op Note (Signed)
DATE OF SERVICE: 11/15/2023  PATIENT:  [name]  77 y.o. 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 My assistant helped with exposure through counter tension, suctioning, ligation and retraction to better visualize the surgical field.  My assistant expedited sewing during the case by following my sutures. Wherever I use the term "we" in the report, my assistant actively helped me with that portion of the procedure.  ANESTHESIA:   general  EBL: 50mL  BLOOD ADMINISTERED:none  DRAINS: none   LOCAL MEDICATIONS USED:  NONE  SPECIMEN:  none  COUNTS: confirmed correct.  TOURNIQUET:  none  PATIENT DISPOSITION:  PACU - hemodynamically stable.   Delay start of Pharmacological VTE agent (>24hrs) due to surgical blood loss or risk of bleeding: no  INDICATION FOR PROCEDURE: ADELIN BRODMAN is a 77 y.o. 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 After careful discussion of risks, benefits, and alternatives the patient was offered right axillary-femoral bypass. The patient understood and wished to proceed.  OPERATIVE FINDINGS: healthy axillary artery. Hood of prior right femoral-popliteal bypass with greater saphenous vein exposed and found to be healthy. Careful tunneling to allow axillary redundancy to avoid pullout and avoid tunneling into chest or peritoneum. Good technical result from bypass with brisk doppler flow in distal axillary artery and femoral arteries at completion.   DESCRIPTION OF PROCEDURE: After identification of the patient in the pre-operative  holding area, the patient was transferred to the operating room. The patient was positioned supine on the operating room table. Anesthesia was induced. The right chest, abdomen and groin were prepped and draped in standard fashion. A surgical pause was performed confirming correct patient, procedure, and operative location.  The right groin scar was reopened sharply with a 10 blade.  Incision was carried down carefully through scar and subcutaneous tissue with Bovie electrocautery.  We identified the hood of the prior femoral-popliteal bypass graft done with greater saphenous vein.  The hood was skeletonized about its anterior and lateral aspects to allow a side-biting clamp to be applied for vascular control.  We minimized skeletonization to limit risk of injury in the redo plane.  An incision was made in the right chest just below the clavicle to expose the axillary artery.  The incision was transverse.  The incision was carried down to the subcutaneous tissue until the deltopectoral fascia was identified.  This was incised with Bovie electrocautery.  The muscles of the pectoralis major were split atraumatically.  Identified the pectoralis minor muscle.  Identified the axillary artery immediately below the pectoralis minor muscle.  The brachial plexus structures were identified and carefully avoided.  Silastic Vesseloops were applied to the axillary artery proximally distally and around the branch to allow for vascular control.  The Gore tunneler was used to connect the 2 exposures.  I began the tunneling in the axillary exposure digitally to allow for redundancy and a gentle curve to reduce the risk of axillary pull out.  The tunneler was then delivered with great care to avoid introducing the tunnel into the peritoneum or the chest.  Once the tunneler was delivered  through the subcutaneous plane.  A 6 mm externally supported PTFE vascular graft was delivered through the tunnel.  The patient was  systemically heparinized.  Activated clotting time measurements were used at the case to confirm adequate anticoagulation.  Clamps were applied to the axillary artery.  The proximal aspect of the vascular graft was beveled to allow and side anastomosis.  A anterior arteriotomy was made with an 11 blade and lengthened and widened with small Potts scissors.  Proximal anastomosis was then performed using continuous running suture of 6-0 Prolene.  After completion the anastomosis the anastomosis was flushed on the open end of the vascular graft.  Excellent flow was noted.  Needle hole bleeding was identified.  The wound was packed and attention was turned to the groin.  Clamps were applied to the saphenous vein proximally and distally.  An anterior graftotomy was made with an 11 blade and extended with Potts scissors.  The distal end of the vascular graft was beveled to allow distal anastomosis without undue tension or redundancy.  The anastomosis was performed using continuous running suture of 6-0 Prolene.  Immediately prior to completion the anastomosis was flushed and de-aired.  Clamps were released on the inflow.  Good result was achieved.  Needle hole bleeding was noted.  A Doppler machine was brought onto the field and used to evaluate the repair.  Excellent flow was noted in the distal bypass which was graft dependent.  Good Doppler flow was heard in the distal axillary artery.  Satisfied we ended the case here.  Heparin was reversed with protamine.  Hemostasis was confirmed in the anastomoses in the surgical beds.  The axillary incision was closed using 3-0 Vicryl and 4-0 Monocryl.  The groin was closed in layers using 2-0 Vicryl, 3-0 Vicryl, 4-0 Monocryl.  Steri-Strips were applied.  4 x 4's and Tegaderms were applied.  Upon completion of the case instrument and sharps counts were confirmed correct. The patient was transferred to the PACU in good condition. I was present for all portions of the  procedure.  FOLLOW UP PLAN: Assuming a normal postoperative course, I will see the patient in 4 weeks with ABI and Ax-fem duplex.   Rande Brunt. Lenell Antu, MD [facility] Phone Number: [phone] 11/15/2023 10:14 AM

## 2023-11-15 NOTE — Transfer of Care (Signed)
Immediate Anesthesia Transfer of Care Note  Patient: Jill Higgins  Procedure(s) Performed: RIGHT AXILLO-FEMORAL BYPASS GRAFT (Right)  Patient Location: PACU  Anesthesia Type:General  Level of Consciousness: oriented, drowsy, and responds to stimulation  Airway & Oxygen Therapy: Patient Spontanous Breathing and Patient connected to nasal cannula oxygen  Post-op Assessment: Report given to RN, Post -op Vital signs reviewed and stable, and Patient moving all extremities X 4  Post vital signs: Reviewed and stable  Last Vitals:  Vitals Value Taken Time  BP 131/60 11/15/23 1015  Temp 98.0   Pulse 57 11/15/23 1019  Resp 18 11/15/23 1019  SpO2 96 % 11/15/23 1019  Vitals shown include unfiled device data.  Last Pain:  Vitals:   11/15/23 0601  TempSrc: Oral  PainSc: 0-No pain         Complications: No notable events documented.

## 2023-11-16 ENCOUNTER — Encounter (HOSPITAL_COMMUNITY): Payer: Self-pay | Admitting: Vascular Surgery

## 2023-11-16 LAB — GLUCOSE, CAPILLARY
Glucose-Capillary: 219 mg/dL — ABNORMAL HIGH (ref 70–99)
Glucose-Capillary: 230 mg/dL — ABNORMAL HIGH (ref 70–99)
Glucose-Capillary: 278 mg/dL — ABNORMAL HIGH (ref 70–99)
Glucose-Capillary: 136 mg/dL — ABNORMAL HIGH (ref 70–99)
Glucose-Capillary: 108 mg/dL — ABNORMAL HIGH (ref 70–99)

## 2023-11-16 LAB — LIPID PANEL
Cholesterol: 87 mg/dL (ref 0–200)
HDL: 49 mg/dL (ref >40)
LDL Cholesterol: 27 mg/dL (ref 0–99)
Total CHOL/HDL Ratio: 1.8 {ratio}
Triglycerides: 56 mg/dL (ref <150)
VLDL: 11 mg/dL (ref 0–40)

## 2023-11-16 LAB — BASIC METABOLIC PANEL
Anion gap: 7 (ref 5–15)
BUN: 13 mg/dL (ref 8–23)
CO2: 23 mmol/L (ref 22–32)
Calcium: 8 mg/dL — ABNORMAL LOW (ref 8.9–10.3)
Chloride: 108 mmol/L (ref 98–111)
Creatinine, Ser: 0.96 mg/dL (ref 0.44–1.00)
GFR, Estimated: >60 mL/min (ref >60)
Glucose, Bld: 242 mg/dL — ABNORMAL HIGH (ref 70–99)
Potassium: 4.2 mmol/L (ref 3.5–5.1)
Sodium: 138 mmol/L (ref 135–145)

## 2023-11-16 LAB — CBC
HCT: 31.4 % — ABNORMAL LOW (ref 36.0–46.0)
Hemoglobin: 10.3 g/dL — ABNORMAL LOW (ref 12.0–15.0)
MCH: 31.2 pg (ref 26.0–34.0)
MCHC: 32.8 g/dL (ref 30.0–36.0)
MCV: 95.2 fL (ref 80.0–100.0)
Platelets: 245 10*3/uL (ref 150–400)
RBC: 3.3 MIL/uL — ABNORMAL LOW (ref 3.87–5.11)
RDW: 13.1 % (ref 11.5–15.5)
WBC: 10.8 10*3/uL — ABNORMAL HIGH (ref 4.0–10.5)
nRBC: 0 % (ref 0.0–0.2)

## 2023-11-16 LAB — POCT ACTIVATED CLOTTING TIME: Activated Clotting Time: 239 s

## 2023-11-16 MED ORDER — INSULIN GLARGINE-YFGN 100 UNIT/ML ~~LOC~~ SOLN
8.0000 [IU] | Freq: Every day | SUBCUTANEOUS | Status: DC
  Administered 2023-11-16 – 2023-11-19 (×4): 8 [IU] via SUBCUTANEOUS
  Filled 2023-11-16 (×5): qty 0.08

## 2023-11-16 NOTE — Evaluation (Signed)
Occupational Therapy Evaluation Patient Details Name: Jill Higgins MRN: [id number] DOB: August 13, 1946 Today's Date: 11/16/2023   History of Present Illness Pt presented to [facility] for scheduled R axillary femoral bypass. PMH: bronchitis, bulging disc, depression w/anxiety, DM II, fibromyalgia, GERD, glaucoma, hyperlipidemia, HTN, PVD, sleep apnea   Clinical Impression   Pt in good spirits, minimal pain to R incision in axillary region, lives alone in ground floor apartment, has two friend who can assist if needed. Pt PLOF ind without AD, currently close to or at baseline, used RW/rollator for comfort, but was able to ambulate around room as needed, no LOB. Pt independent in ADLs. Pt would benefit from use of rollator in home/community setting to maximize safety and improve ability to transport items and take rest breaks when needed. No further acute OT or follow up needs.        If plan is discharge home, recommend the following:      Functional Status Assessment  Patient has had a recent decline in their functional status and demonstrates the ability to make significant improvements in function in a reasonable and predictable amount of time.  Equipment Recommendations  Other (comment) (rollator)    Recommendations for Other Services       Precautions / Restrictions Precautions Precautions: Fall Restrictions Weight Bearing Restrictions Per Provider Order: No      Mobility Bed Mobility Overal bed mobility: Independent                  Transfers Overall transfer level: Modified independent Equipment used: Rollator (4 wheels)               General transfer comment: mod I with RW      Balance Overall balance assessment: Modified Independent                                         ADL either performed or assessed with clinical judgement   ADL Overall ADL's : Independent;At baseline                                       General  ADL Comments: ind.     Vision Baseline Vision/History: 1 Wears glasses Ability to See in Adequate Light: 0 Adequate Patient Visual Report: No change from baseline       Perception         Praxis         Pertinent Vitals/Pain Pain Assessment Pain Assessment: 0-10 Pain Score: 2  Pain Location: RUE Pain Descriptors / Indicators: Sore, Discomfort Pain Intervention(s): Monitored during session     Extremity/Trunk Assessment Upper Extremity Assessment Upper Extremity Assessment: Overall WFL for tasks assessed           Communication Communication Communication: No apparent difficulties   Cognition Arousal: Alert Behavior During Therapy: WFL for tasks assessed/performed Overall Cognitive Status: Within Functional Limits for tasks assessed                                       General Comments       Exercises     Shoulder Instructions      Home Living Family/patient expects to be discharged to:: Private residence Living Arrangements: Alone Available  Help at Discharge: Family;Available PRN/intermittently Type of Home: Apartment Home Access: Level entry     Home Layout: One level     Bathroom Shower/Tub: Chief Strategy Officer: Handicapped height     Home Equipment: Agricultural consultant (2 wheels);Tub bench;Grab bars - tub/shower;Cane - single point;BSC/3in1   Additional Comments: Pt has two friends who live close who can assist if needed.      Prior Functioning/Environment Prior Level of Function : Independent/Modified Independent;Driving             Mobility Comments: Pt states that occasionally she uses the cane if her pain is really severe. ADLs Comments: Pt reports independent/Mod I  with ADL's and IADL's.        OT Problem List: Pain      OT Treatment/Interventions:      OT Goals(Current goals can be found in the care plan section) Acute Rehab OT Goals Patient Stated Goal: to return home OT Goal Formulation:  With patient Time For Goal Achievement: 11/30/23 Potential to Achieve Goals: Good  OT Frequency:      Co-evaluation              AM-PAC OT "6 Clicks" Daily Activity     Outcome Measure Help from another person eating meals?: None Help from another person taking care of personal grooming?: None Help from another person toileting, which includes using toliet, bedpan, or urinal?: None Help from another person bathing (including washing, rinsing, drying)?: None Help from another person to put on and taking off regular upper body clothing?: None Help from another person to put on and taking off regular lower body clothing?: None 6 Click Score: 24   End of Session Equipment Utilized During Treatment: Rollator (4 wheels) Nurse Communication: Mobility status  Activity Tolerance: Patient tolerated treatment well Patient left: in bed;with call bell/phone within reach  OT Visit Diagnosis: Pain Pain - Right/Left: Right Pain - part of body: Arm                Time: 1255-1319 OT Time Calculation (min): 24 min Charges:  OT General Charges $OT Visit: 1 Visit OT Evaluation $OT Eval Low Complexity: 1 Low OT Treatments $Self Care/Home Management : 8-22 mins  [place], OTR/L   Jill Higgins R Lallie Strahm 11/16/2023, 1:20 PM

## 2023-11-16 NOTE — Progress Notes (Signed)
Physical Therapy Treatment Patient Details Name: Jill Higgins MRN: [id number] DOB: 11-13-1946 Today's Date: 11/16/2023   History of Present Illness Pt presented to [facility] for scheduled R axillary femoral bypass. PMH: bronchitis, bulging disc, depression w/anxiety, DM II, fibromyalgia, GERD, glaucoma, hyperlipidemia, HTN, PVD, sleep apnea    PT Comments  Pt progressing well with mobility. Ambulated 200' with RW CGA. She required min assist bed mobility and CGA transfers. Pt with c/o RUE pain/soreness. Updated DME recs as pt reports need for RW at home.    If plan is discharge home, recommend the following: Help with stairs or ramp for entrance;Assist for transportation;Assistance with cooking/housework   Can travel by private vehicle        Equipment Recommendations  Rolling walker (2 wheels)    Recommendations for Other Services       Precautions / Restrictions Precautions Precautions: Fall     Mobility  Bed Mobility Overal bed mobility: Needs Assistance Bed Mobility: Supine to Sit, Sit to Supine     Supine to sit: Min assist Sit to supine: Contact guard assist   General bed mobility comments: assist to elevated trunk, increased time    Transfers Overall transfer level: Needs assistance Equipment used: Rolling walker (2 wheels) Transfers: Sit to/from Stand Sit to Stand: Contact guard assist                Ambulation/Gait Ambulation/Gait assistance: Contact guard assist Gait Distance (Feet): 200 Feet Assistive device: Rolling walker (2 wheels) Gait Pattern/deviations: Step-through pattern, Decreased stride length Gait velocity: decreased Gait velocity interpretation: <1.31 ft/sec, indicative of household ambulator   General Gait Details: slow steady Optician, dispensing     Tilt Bed    Modified Rankin (Stroke Patients Only)       Balance Overall balance assessment: Mild deficits observed, not formally tested                                           Cognition Arousal: Alert Behavior During Therapy: WFL for tasks assessed/performed Overall Cognitive Status: Within Functional Limits for tasks assessed                                          Exercises      General Comments        Pertinent Vitals/Pain Pain Assessment Pain Assessment: Faces Faces Pain Scale: Hurts little more Pain Location: RUE Pain Descriptors / Indicators: Sore, Discomfort Pain Intervention(s): Monitored during session    Home Living                          Prior Function            PT Goals (current goals can now be found in the care plan section) Acute Rehab PT Goals Patient Stated Goal: home Progress towards PT goals: Progressing toward goals    Frequency    Min 1X/week      PT Plan      Co-evaluation              AM-PAC PT "6 Clicks" Mobility   Outcome Measure  Help needed turning from your back to your side while  in a flat bed without using bedrails?: A Little Help needed moving from lying on your back to sitting on the side of a flat bed without using bedrails?: A Little Help needed moving to and from a bed to a chair (including a wheelchair)?: A Little Help needed standing up from a chair using your arms (e.g., wheelchair or bedside chair)?: A Little Help needed to walk in hospital room?: A Little Help needed climbing 3-5 steps with a railing? : A Little 6 Click Score: 18    End of Session Equipment Utilized During Treatment: Gait belt Activity Tolerance: Patient tolerated treatment well Patient left: in bed;with call bell/phone within reach;with bed alarm set Nurse Communication: Mobility status PT Visit Diagnosis: Unsteadiness on feet (R26.81);Other abnormalities of gait and mobility (R26.89)     Time: 1040-1105 PT Time Calculation (min) (ACUTE ONLY): 25 min  Charges:    $Gait Training: 23-37 mins PT General Charges $$ ACUTE  PT VISIT: 1 Visit                     Ferd Glassing., PT  Office # [phone]    Ilda Foil 11/16/2023, 12:27 PM

## 2023-11-16 NOTE — Inpatient Diabetes Management (Signed)
Inpatient Diabetes Program Recommendations  AACE/ADA: New Consensus Statement on Inpatient Glycemic Control (2015)  Target Ranges:  Prepandial:   less than 140 mg/dL      Peak postprandial:   less than 180 mg/dL (1-2 hours)      Critically ill patients:  140 - 180 mg/dL   Lab Results  Component Value Date   GLUCAP 278 (H) 11/16/2023   HGBA1C 9.5 (A) 09/27/2023    Review of Glycemic Control  Latest Reference Range & Units 11/15/23 06:04 11/15/23 10:17 11/15/23 16:56 11/15/23 21:43 11/16/23 06:30 11/16/23 07:40 11/16/23 11:48  Glucose-Capillary 70 - 99 mg/dL 119 (H) 147 (H) 829 (H) 246 (H) 219 (H) 230 (H) 278 (H)  (H): Data is abnormally high  Diabetes history: DM2 Outpatient Diabetes medications: Lantus 16 units every day, Novolog 4-8 units TID, Freestyle Libre 3 Current orders for Inpatient glycemic control: Semglee 8 units QD, Novolog 0-9 units TID, Metformin XR 500 mg BID  Met with patient at bedside.  Reviewed patient's  A1c of 9.5% from 09/27/23. Explained what a A1c is and what it measures. Also reviewed goal A1c with patient, importance of good glucose control @ home, and blood sugar goals.  She states Dr. Elvera Lennox increased her Lantus from 8 units to 16 units earlier this week.  She wears a Freestyle Libre 3 CGM but has had trouble with it falling off.  Gherghe told her to look at [employer] and order overlay patches.  She could also order Skin Tac from [facility] which is applied prior to the CGM and makes the skin sticky.  She verbalizes understanding.    Will continue to follow while inpatient.  Thank you, Dulce Sellar, MSN, CDCES Diabetes Coordinator Inpatient Diabetes Program [phone] (team pager from 8a-5p)

## 2023-11-16 NOTE — Progress Notes (Signed)
PHARMACIST LIPID MONITORING   Jill Higgins is a 78 y.o. female admitted on 11/15/2023 for R axillary-femoral bypass.  Pharmacy has been consulted to optimize lipid-lowering therapy with the indication of secondary prevention for clinical ASCVD.  Recent Labs:  Lipid Panel (last 6 months):   Lab Results  Component Value Date   CHOL 87 11/16/2023   TRIG 56 11/16/2023   HDL 49 11/16/2023   CHOLHDL 1.8 11/16/2023   VLDL 11 11/16/2023   LDLCALC 27 11/16/2023    Hepatic function panel (last 6 months):   Lab Results  Component Value Date   AST 23 10/23/2023   ALT 20 10/23/2023   ALKPHOS 72 10/23/2023   BILITOT 0.4 10/23/2023    SCr (since admission):   Serum creatinine: 0.96 mg/dL 40/98/11 9147 Estimated creatinine clearance: 38.4 mL/min  Current therapy and lipid therapy tolerance Current lipid-lowering therapy: Atorvastatin 80 mg PO daily Previous lipid-lowering therapies (if applicable): Atorvastatin 10 mg, Pravastatin 10 mg, Pravastatin 20 mg Documented or reported allergies or intolerances to lipid-lowering therapies (if applicable): None  Assessment:   Patient prefers no changes in lipid-lowering therapy at this time due to LDL at goal  Plan:    1.Statin intensity (high intensity recommended for all patients regardless of the LDL):  No statin changes. The patient is already on a high intensity statin.  2.Add ezetimibe (if any one of the following):   Not indicated at this time.  3.Refer to lipid clinic:   No  4.Follow-up with:  Cardiology provider - None  5.Follow-up labs after discharge:  No changes in lipid therapy, repeat a lipid panel in one year.     Lennie Muckle, PharmD PGY1 Pharmacy Resident 11/16/2023 9:39 AM

## 2023-11-16 NOTE — Plan of Care (Signed)
  Problem: Clinical Measurements: Goal: Will remain free from infection Outcome: Progressing   Problem: Clinical Measurements: Goal: Ability to maintain clinical measurements within normal limits will improve Outcome: Progressing   Problem: Clinical Measurements: Goal: Diagnostic test results will improve Outcome: Progressing   Problem: Clinical Measurements: Goal: Respiratory complications will improve Outcome: Progressing   Problem: Nutrition: Goal: Adequate nutrition will be maintained Outcome: Progressing

## 2023-11-16 NOTE — TOC Initial Note (Signed)
Transition of Care (TOC) - Initial/Assessment Note  Donn Pierini RN, BSN Transitions of Care Unit 4E- RN Case Manager See Treatment Team for direct phone #   Patient Details  Name: Jill Higgins MRN: [id number] Date of Birth: 08/28/1946  Transition of Care [facility]) CM/SW Contact:    Darrold Span, RN Phone Number: 11/16/2023, 3:08 PM  Clinical Narrative:                 Noted HHPT order placed, Cm Notified by [facility] liaison -following patient with VVS office protocol referral prearranged for [facility] needs.   CM in to speak with pt for [facility] and DME needs.  Per pt she has used HH in past- no sure of agency name- discussed VVS office referral to [facility] and choice for [facility]- pt states she is agreeable to use [facility] for [facility] needs.  Pt also voiced that her insurance would change to [facility] Medicare next week on Jan.1- CM has sent msg to [facility] liaison regarding insurance update  Pt voiced she would also prefer rollator over RW for home- Will need MD to place DME order for Rollator- pt has no preference for DME provider.   Pt states plan is to return home tomorrow and her ride will be here for transport after 1pm.   TOC to follow for DME need once order placed.   Expected Discharge Plan: Home w Home Health Services Barriers to Discharge: No Barriers Identified   Patient Goals and CMS Choice Patient states their goals for this hospitalization and ongoing recovery are:: return home CMS Medicare.gov Compare Post Acute Care list provided to:: Patient Choice offered to / list presented to : Patient      Expected Discharge Plan and Services   Discharge Planning Services: CM Consult Post Acute Care Choice: Home Health, Durable Medical Equipment Living arrangements for the past 2 months: Apartment                 DME Arranged: Walker rolling with seat         HH Arranged: PT HH Agency: [facility] Date HH Agency Contacted: 11/16/23 Time HH Agency  Contacted: 1508 Representative spoke with at [facility] Agency: Herbert Seta  Prior Living Arrangements/Services Living arrangements for the past 2 months: Apartment Lives with:: Self Patient language and need for interpreter reviewed:: Yes Do you feel safe going back to the place where you live?: Yes      Need for Family Participation in Patient Care: Yes (Comment) Care giver support system in place?: Yes (comment)   Criminal Activity/Legal Involvement Pertinent to Current Situation/Hospitalization: No - Comment as needed  Activities of Daily Living   ADL Screening (condition at time of admission) Independently performs ADLs?: Yes (appropriate for developmental age) Is the patient deaf or have difficulty hearing?: No Does the patient have difficulty seeing, even when wearing glasses/contacts?: No Does the patient have difficulty concentrating, remembering, or making decisions?: No  Permission Sought/Granted Permission sought to share information with : Facility Industrial/product designer granted to share information with : Yes, Verbal Permission Granted     Permission granted to share info w AGENCY: HH/DME        Emotional Assessment Appearance:: Appears stated age Attitude/Demeanor/Rapport: Engaged Affect (typically observed): Accepting Orientation: : Oriented to Self, Oriented to Place, Oriented to  Time, Oriented to Situation Alcohol / Substance Use: Not Applicable Psych Involvement: No (comment)  Admission diagnosis:  PAD (peripheral artery disease) (HCC) [I73.9] Patient Active Problem List  Diagnosis Date Noted   Peripheral arterial disease (HCC) 04/20/2023   Pancreatitis 01/25/2022   Acute pancreatitis 09/02/2019   Type 2 diabetes mellitus with vascular disease (HCC) 09/02/2019   Diabetes (HCC) 05/23/2018   Other spondylosis with radiculopathy, cervical region 03/05/2018   Foraminal stenosis of cervical region 09/11/2017   Partial tear of right rotator cuff 04/04/2017    Aftercare following surgery of the circulatory system 02/23/2016   S/P femoropopliteal bypass surgery 02/23/2016   PAD (peripheral artery disease) (HCC) 02/08/2016   Leg edema, left 12/18/2013   Aftercare following surgery of the circulatory system, NEC 11/19/2013   Ischemic leg 10/21/2013   Peripheral vascular disease, unspecified (HCC) 09/18/2013   Preop cardiovascular exam 09/08/2013   Hypertension    Hyperlipidemia    Peripheral vascular disease (HCC)    Fibromyalgia    GERD (gastroesophageal reflux disease)    Depression with anxiety    Pain in limb 04/03/2013   Atherosclerosis of native artery of extremity with intermittent claudication (HCC) 08/01/2012   Claudication (HCC) 12/28/2011   PCP:  Merri Brunette, MD Pharmacy:   [facility] [address] [place] - [address] [place] [address] Phone: [phone] Fax: [phone]  [id number] - [place], [place] - [address] AT [address]OSS [address] [place] [address] Phone: [phone] Fax: [phone]     Social Drivers of Health (SDOH) Social History: SDOH Screenings   Food Insecurity: No Food Insecurity (11/15/2023)  Housing: Low Risk  (11/15/2023)  Transportation Needs: No Transportation Needs (11/15/2023)  Utilities: Not At Risk (11/15/2023)  Depression (PHQ2-9): Low Risk  (11/11/2020)  Tobacco Use: Medium Risk (11/15/2023)   SDOH Interventions:     Readmission Risk Interventions     No data to display

## 2023-11-16 NOTE — Progress Notes (Addendum)
  Progress Note    11/16/2023 8:09 AM 1 Day Post-Op  Subjective:  no major complaints. Says pain overall is very manageable. Tolerated ambulating in her room yesterday afternoon   Vitals:   11/16/23 0027 11/16/23 0409  BP: 107/83 112/71  Pulse: 62 (!) 57  Resp: 18 18  Temp: 97.6 F (36.4 C) 98.3 F (36.8 C)  SpO2: 98% 96%   Physical Exam: Cardiac:  regular Lungs:  non labored Incisions:  right infraclavicular incision dressings c/d/I without swelling or hematoma. Right groin incision dressings with some strike through. Soft without swelling or hematoma Extremities:  palpable pulse in bypass graft on right flank. Palpable DP pulses bilaterally. Feet warm and well perfused Abdomen:  soft Neurologic: alert and oriented  CBC    Component Value Date/Time   WBC 10.8 (H) 11/16/2023 0510   RBC 3.30 (L) 11/16/2023 0510   HGB 10.3 (L) 11/16/2023 0510   HCT 31.4 (L) 11/16/2023 0510   PLT 245 11/16/2023 0510   MCV 95.2 11/16/2023 0510   MCH 31.2 11/16/2023 0510   MCHC 32.8 11/16/2023 0510   RDW 13.1 11/16/2023 0510   LYMPHSABS 2.0 01/31/2022 0105   MONOABS 0.3 01/31/2022 0105   EOSABS 0.3 01/31/2022 0105   BASOSABS 0.0 01/31/2022 0105    BMET    Component Value Date/Time   NA 138 11/16/2023 0510   K 4.2 11/16/2023 0510   CL 108 11/16/2023 0510   CO2 23 11/16/2023 0510   GLUCOSE 242 (H) 11/16/2023 0510   BUN 13 11/16/2023 0510   CREATININE 0.96 11/16/2023 0510   CALCIUM 8.0 (L) 11/16/2023 0510   GFRNONAA >60 11/16/2023 0510   GFRAA >60 09/04/2019 0249    INR    Component Value Date/Time   INR 0.9 10/23/2023 1451     Intake/Output Summary (Last 24 hours) at 11/16/2023 0809 Last data filed at 11/16/2023 1610 Gross per 24 hour  Intake 1210.84 ml  Output 1055 ml  Net 155.84 ml     Assessment/Plan:  77 y.o. female is s/p right axillary -femoral bypass 1 Day Post-Op   BLE well perfused and warm with palpable DP pulses. Palpable pulse in bypass graft   Incisions intact and well appearing Pain well controlled Hemodynamically stable PT recommending HH. Order placed Mobilize as tolerated Anticipate d/c tomorrow if she continues to progress well  Graceann Congress, PA-C [facility] [phone] 11/16/2023 8:09 AM  VASCULAR STAFF ADDENDUM: I have independently interviewed and examined the patient. I agree with the above.   Rande Brunt. Lenell Antu, MD [facility] of [facility] Phone Number: [phone] 11/16/2023 1:56 PM

## 2023-11-17 LAB — GLUCOSE, CAPILLARY
Glucose-Capillary: 105 mg/dL — ABNORMAL HIGH (ref 70–99)
Glucose-Capillary: 88 mg/dL (ref 70–99)
Glucose-Capillary: 170 mg/dL — ABNORMAL HIGH (ref 70–99)
Glucose-Capillary: 92 mg/dL (ref 70–99)
Glucose-Capillary: 111 mg/dL — ABNORMAL HIGH (ref 70–99)

## 2023-11-17 NOTE — Progress Notes (Addendum)
  Progress Note    11/17/2023 8:10 AM 2 Days Post-Op  Subjective:  right flank pain along tunneling of bypass graft and also medial right upper arm says it feels like its pulling. Otherwise says she walked yesterday in the hallway and she had no pain in her leg on ambulation. Feels she needs another day here for pain control   Vitals:   11/17/23 0724 11/17/23 0730  BP: (!) 108/56 110/61  Pulse: 60 66  Resp:  14  Temp: 98.7 F (37.1 C)   SpO2: 100% 100%   Physical Exam: Cardiac:  regular Lungs:  non labored Incisions:  Right infraclavicular incision without swelling or hematoma, steri strips in place. Right groin incision intact and well appearing, dry gauze dressings replaced. Soft without swelling or hematoma. Extremities: 2+ right radial pulse, hand warm and well perfused. 2+ right Dp pulse palpable. Palpable pulse in bypass on right flank. Very minimal ecchymosis present Abdomen:  soft Neurologic: alert and oriented  CBC    Component Value Date/Time   WBC 10.8 (H) 11/16/2023 0510   RBC 3.30 (L) 11/16/2023 0510   HGB 10.3 (L) 11/16/2023 0510   HCT 31.4 (L) 11/16/2023 0510   PLT 245 11/16/2023 0510   MCV 95.2 11/16/2023 0510   MCH 31.2 11/16/2023 0510   MCHC 32.8 11/16/2023 0510   RDW 13.1 11/16/2023 0510   LYMPHSABS 2.0 01/31/2022 0105   MONOABS 0.3 01/31/2022 0105   EOSABS 0.3 01/31/2022 0105   BASOSABS 0.0 01/31/2022 0105    BMET    Component Value Date/Time   NA 138 11/16/2023 0510   K 4.2 11/16/2023 0510   CL 108 11/16/2023 0510   CO2 23 11/16/2023 0510   GLUCOSE 242 (H) 11/16/2023 0510   BUN 13 11/16/2023 0510   CREATININE 0.96 11/16/2023 0510   CALCIUM 8.0 (L) 11/16/2023 0510   GFRNONAA >60 11/16/2023 0510   GFRAA >60 09/04/2019 0249    INR    Component Value Date/Time   INR 0.9 10/23/2023 1451     Intake/Output Summary (Last 24 hours) at 11/17/2023 0810 Last data filed at 11/16/2023 2122 Gross per 24 hour  Intake 720 ml  Output --  Net  720 ml     Assessment/Plan:  77 y.o. female is s/p right axillary -femoral bypass 2 Days Post-op  RUE and RLE remain well perfused and warm with palpable pulses Incisions are intact and well appearing Remains hemodynamically stable Continue to mobilize as tolerated HH PT set up with [facility] Feels she needs another day here for pain control  Graceann Congress, PA-C [facility] [phone] 11/17/2023 8:10 AM  VASCULAR STAFF ADDENDUM: I have independently interviewed and examined the patient. I agree with the above.   Rande Brunt. Lenell Antu, MD [facility] of [facility] Phone Number: [phone] 11/17/2023 10:51 AM

## 2023-11-17 NOTE — Progress Notes (Signed)
Patient was in bathroom after she walked with PT, she couldn't get up from the seat and mentioned that feeling tired than yesterday, She remained on the bed most of the time and was drowsy today and found incontinent on the bed once.  Denies dizziness, lightheadedness while walking. Encouraged for oral intake, vital signes were WNL at that time.   11/17/23 1623  Vitals  BP 114/68  MAP (mmHg) 81  BP Location Left Arm  BP Method Automatic  Patient Position (if appropriate) Lying  Pulse Rate 60  Pulse Rate Source Monitor  ECG Heart Rate 60  Resp 13  Level of Consciousness  Level of Consciousness Alert  MEWS COLOR  MEWS Score Color Green  Oxygen Therapy  SpO2 100 %  O2 Device Room Air  MEWS Score  MEWS Temp 0  MEWS Systolic 0  MEWS Pulse 0  MEWS RR 1  MEWS LOC 0  MEWS Score 1

## 2023-11-17 NOTE — Progress Notes (Addendum)
Mobility Specialist Progress Note   11/17/23 1530  Mobility  Activity Ambulated with assistance in hallway  Level of Assistance Contact guard assist, steadying assist  Assistive Device Front wheel walker  Distance Ambulated (ft) 150 ft  Range of Motion/Exercises Active;All extremities  Activity Response Tolerated well   Patient received in supine, reluctant to participate but agreeable with encouragement. Required increased time and min A for bed mobility secondary to LLE pain. Stood with min-mod A + tactile cues for hand placement. Ambulated min guard with slow steady gait. Reported improvement in BLE pain tolerance as ambulatory distance increased. Needed cues for safety to maintain proximal distance and keep hands on RW throughout ambulation. LOB x1 requiring min A for balance. Returned to room without complaint or incident. Was left in restroom with all needs met, call bell in reach.   Jill Higgins, BS EXP Mobility Specialist Please contact via SecureChat or Rehab office at [phone]

## 2023-11-17 NOTE — Plan of Care (Signed)
  Problem: Clinical Measurements: Goal: Diagnostic test results will improve Outcome: Progressing   Problem: Clinical Measurements: Goal: Will remain free from infection Outcome: Progressing   Problem: Clinical Measurements: Goal: Ability to maintain clinical measurements within normal limits will improve Outcome: Progressing   Problem: Coping: Goal: Level of anxiety will decrease Outcome: Progressing   Problem: Nutrition: Goal: Adequate nutrition will be maintained Outcome: Progressing   Problem: Safety: Goal: Ability to remain free from injury will improve Outcome: Progressing   Problem: Skin Integrity: Goal: Risk for impaired skin integrity will decrease Outcome: Progressing

## 2023-11-18 LAB — GLUCOSE, CAPILLARY
Glucose-Capillary: 101 mg/dL — ABNORMAL HIGH (ref 70–99)
Glucose-Capillary: 212 mg/dL — ABNORMAL HIGH (ref 70–99)
Glucose-Capillary: 168 mg/dL — ABNORMAL HIGH (ref 70–99)
Glucose-Capillary: 110 mg/dL — ABNORMAL HIGH (ref 70–99)

## 2023-11-18 NOTE — Progress Notes (Signed)
Patient used a call bell and mentioned about she is bleeding from the groin incision, upon assessing her, a tinge of blood noticed on her gown and part of it was wet with serous drainage.  Gauze dressing reinforced on her groin. And found blood was oozing from needle stick site from her right lower abdomen,  pressure dressing applied, will continue to monitor.

## 2023-11-18 NOTE — Progress Notes (Addendum)
°  Progress Note    11/18/2023 8:26 AM 3 Days Post-Op  Subjective:  feeling better this morning. Says yesterday she just felt very weak. Legs are still feeling weak   Vitals:   11/18/23 0732 11/18/23 0820  BP: 113/74 113/74  Pulse: (!) 55 70  Resp: 19   Temp: (!) 97.5 F (36.4 C)   SpO2: 100%    Physical Exam: Cardiac:  regular Lungs:  non labored Incisions: Right infraclavicular incision without swelling or hematoma, steri strips in place. Right groin incision intact and well appearing. Soft without swelling or hematoma.  Extremities: 2+ right radial pulse, hand warm and well perfused. 2+ right Dp pulse palpable. Palpable pulse in bypass on right flank Abdomen:  soft Neurologic: alert and oriented  CBC    Component Value Date/Time   WBC 10.8 (H) 11/16/2023 0510   RBC 3.30 (L) 11/16/2023 0510   HGB 10.3 (L) 11/16/2023 0510   HCT 31.4 (L) 11/16/2023 0510   PLT 245 11/16/2023 0510   MCV 95.2 11/16/2023 0510   MCH 31.2 11/16/2023 0510   MCHC 32.8 11/16/2023 0510   RDW 13.1 11/16/2023 0510   LYMPHSABS 2.0 01/31/2022 0105   MONOABS 0.3 01/31/2022 0105   EOSABS 0.3 01/31/2022 0105   BASOSABS 0.0 01/31/2022 0105    BMET    Component Value Date/Time   NA 138 11/16/2023 0510   K 4.2 11/16/2023 0510   CL 108 11/16/2023 0510   CO2 23 11/16/2023 0510   GLUCOSE 242 (H) 11/16/2023 0510   BUN 13 11/16/2023 0510   CREATININE 0.96 11/16/2023 0510   CALCIUM 8.0 (L) 11/16/2023 0510   GFRNONAA >60 11/16/2023 0510   GFRAA >60 09/04/2019 0249    INR    Component Value Date/Time   INR 0.9 10/23/2023 1451     Intake/Output Summary (Last 24 hours) at 11/18/2023 0826 Last data filed at 11/18/2023 2130 Gross per 24 hour  Intake 760 ml  Output --  Net 760 ml     Assessment/Plan:  77 y.o. female is s/p right axillary -femoral bypass  3 Days Post-Op   RUE and RLE remain well perfused and warm with palpable pulses Incisions are intact and well appearing Remains  hemodynamically stable Still feeling weak and not sure if she is ready to go today  Continue to mobilize as tolerated HH PT set up with [facility] Continue Aspirin, statin, Plavix Possible d/c later today vs tomorrow Follow up has already been arranged in 1 month with ABI and Ax-fem bypass graft duplex  Jill Congress, PA-C [facility] [phone] 11/18/2023 8:26 AM  VASCULAR STAFF ADDENDUM: I have independently interviewed and examined the patient. I agree with the above.   Rande Brunt. Lenell Antu, MD [facility] of [facility] Phone Number: [phone] 11/18/2023 12:00 PM

## 2023-11-19 LAB — GLUCOSE, CAPILLARY
Glucose-Capillary: 113 mg/dL — ABNORMAL HIGH (ref 70–99)
Glucose-Capillary: 155 mg/dL — ABNORMAL HIGH (ref 70–99)
Glucose-Capillary: 157 mg/dL — ABNORMAL HIGH (ref 70–99)
Glucose-Capillary: 127 mg/dL — ABNORMAL HIGH (ref 70–99)

## 2023-11-19 NOTE — Progress Notes (Signed)
Physical Therapy Treatment Patient Details Name: Jill Higgins MRN: [id number] DOB: 05-30-1946 Today's Date: 11/19/2023   History of Present Illness Pt presented to [facility] for scheduled R axillary femoral bypass. PMH: bronchitis, bulging disc, depression w/anxiety, DM II, fibromyalgia, GERD, glaucoma, hyperlipidemia, HTN, PVD, sleep apnea    PT Comments  Pt demo mod I bed mobility, CGA transfers, and CGA amb 200' with RW. Steady gait with RW. Pt with c/o soreness R groin. Pt returned to bed at end of session.     If plan is discharge home, recommend the following: Help with stairs or ramp for entrance;Assist for transportation;Assistance with cooking/housework   Can travel by private vehicle        Equipment Recommendations  Rolling walker (2 wheels)    Recommendations for Other Services       Precautions / Restrictions Precautions Precautions: Fall     Mobility  Bed Mobility Overal bed mobility: Modified Independent             General bed mobility comments: increased time    Transfers Overall transfer level: Needs assistance Equipment used: Rolling walker (2 wheels) Transfers: Sit to/from Stand Sit to Stand: Contact guard assist           General transfer comment: increased time to stabilize balance    Ambulation/Gait Ambulation/Gait assistance: Contact guard assist Gait Distance (Feet): 200 Feet Assistive device: Rolling walker (2 wheels) Gait Pattern/deviations: Step-through pattern, Decreased stride length Gait velocity: decreased Gait velocity interpretation: <1.31 ft/sec, indicative of household ambulator   General Gait Details: slow steady Optician, dispensing     Tilt Bed    Modified Rankin (Stroke Patients Only)       Balance Overall balance assessment: Needs assistance Sitting-balance support: No upper extremity supported, Feet supported Sitting balance-Leahy Scale: Good     Standing balance  support: No upper extremity supported, During functional activity, Bilateral upper extremity supported Standing balance-Leahy Scale: Fair Standing balance comment: RW for hallway amb                            Cognition Arousal: Alert Behavior During Therapy: WFL for tasks assessed/performed Overall Cognitive Status: Within Functional Limits for tasks assessed                                          Exercises      General Comments        Pertinent Vitals/Pain Pain Assessment Pain Assessment: Faces Faces Pain Scale: Hurts little more Pain Location: R groin Pain Descriptors / Indicators: Discomfort Pain Intervention(s): Monitored during session    Home Living                          Prior Function            PT Goals (current goals can now be found in the care plan section) Acute Rehab PT Goals Patient Stated Goal: home Progress towards PT goals: Progressing toward goals    Frequency    Min 1X/week      PT Plan      Co-evaluation              AM-PAC PT "6 Clicks" Mobility   Outcome Measure  Help needed turning from your back to your side while in a flat bed without using bedrails?: None Help needed moving from lying on your back to sitting on the side of a flat bed without using bedrails?: A Little Help needed moving to and from a bed to a chair (including a wheelchair)?: A Little Help needed standing up from a chair using your arms (e.g., wheelchair or bedside chair)?: A Little Help needed to walk in hospital room?: A Little Help needed climbing 3-5 steps with a railing? : A Little 6 Click Score: 19    End of Session Equipment Utilized During Treatment: Gait belt Activity Tolerance: Patient tolerated treatment well Patient left: in bed;with call bell/phone within reach;with bed alarm set Nurse Communication: Mobility status PT Visit Diagnosis: Unsteadiness on feet (R26.81);Other abnormalities of gait and  mobility (R26.89)     Time: 7829-5621 PT Time Calculation (min) (ACUTE ONLY): 24 min  Charges:    $Gait Training: 23-37 mins PT General Charges $$ ACUTE PT VISIT: 1 Visit                     Ferd Glassing., PT  Office # [phone]    Ilda Foil 11/19/2023, 10:40 AM

## 2023-11-19 NOTE — Progress Notes (Signed)
Mobility Specialist Progress Note:   11/19/23 1333  Mobility  Activity Ambulated with assistance in hallway  Level of Assistance Contact guard assist, steadying assist  Assistive Device Front wheel walker  Distance Ambulated (ft) 175 ft  Activity Response Tolerated well  Mobility Referral Yes  Mobility visit 1 Mobility  Mobility Specialist Start Time (ACUTE ONLY) 1310  Mobility Specialist Stop Time (ACUTE ONLY) 1330  Mobility Specialist Time Calculation (min) (ACUTE ONLY) 20 min   Pt received in bed, agreeable to mobility. Pt displayed slight unsteadiness during ambulation and 1x LOB but recovered with CG assist. C/o LLE pain, otherwise asx throughout. VSS. When returning to room pt requesting to use BR. Void successful. Pt left in bed with call bell in reach and all needs met. Bed alarm on.   Leory Plowman  Mobility Specialist Please contact via SecureChat Rehab office at [phone]

## 2023-11-19 NOTE — Anesthesia Postprocedure Evaluation (Signed)
Anesthesia Post Note  Patient: Jill Higgins  Procedure(s) Performed: RIGHT AXILLO-FEMORAL BYPASS GRAFT (Right)     Patient location during evaluation: PACU Anesthesia Type: General Level of consciousness: awake and alert Pain management: pain level controlled Vital Signs Assessment: post-procedure vital signs reviewed and stable Respiratory status: spontaneous breathing, nonlabored ventilation, respiratory function stable and patient connected to nasal cannula oxygen Cardiovascular status: blood pressure returned to baseline and stable Postop Assessment: no apparent nausea or vomiting Anesthetic complications: no   No notable events documented.  Last Vitals:  Vitals:   11/18/23 2305 11/19/23 0249  BP: 121/83 129/62  Pulse: 63 62  Resp: 15 14  Temp: 36.9 C 36.4 C  SpO2: 98% 100%    Last Pain:  Vitals:   11/19/23 0249  TempSrc: Oral  PainSc:                  Mariann Barter

## 2023-11-19 NOTE — Care Management Important Message (Signed)
Important Message  Patient Details  Name: Jill Higgins MRN: [id number] Date of Birth: 1946-05-22   Important Message Given:  Yes - Medicare IM     Renie Ora 11/19/2023, 8:58 AM

## 2023-11-19 NOTE — Progress Notes (Addendum)
  Progress Note    11/19/2023 7:38 AM 4 Days Post-Op  Subjective:  still feeling weak. Says she can't sit up on her own and has to pull herself up because her legs feel weak. Also having some drainage from her right groin incision that started yesterday   Vitals:   11/18/23 2305 11/19/23 0249  BP: 121/83 129/62  Pulse: 63 62  Resp: 15 14  Temp: 98.5 F (36.9 C) 97.6 F (36.4 C)  SpO2: 98% 100%   Physical Exam: Cardiac:  regular Lungs:  non labored Incisions:  right groin incision with SS drainage. Dry gauze dressings applied. Incision is intact and well appearing. Right infraclavicular incision is intact and healing very well Extremities:  BLE well perfused and warm with palpable 2+ Dp pulses. 2+ right radial. Palpable pulse in bypass graft Abdomen:  soft Neurologic: alert and oriented  CBC    Component Value Date/Time   WBC 10.8 (H) 11/16/2023 0510   RBC 3.30 (L) 11/16/2023 0510   HGB 10.3 (L) 11/16/2023 0510   HCT 31.4 (L) 11/16/2023 0510   PLT 245 11/16/2023 0510   MCV 95.2 11/16/2023 0510   MCH 31.2 11/16/2023 0510   MCHC 32.8 11/16/2023 0510   RDW 13.1 11/16/2023 0510   LYMPHSABS 2.0 01/31/2022 0105   MONOABS 0.3 01/31/2022 0105   EOSABS 0.3 01/31/2022 0105   BASOSABS 0.0 01/31/2022 0105    BMET    Component Value Date/Time   NA 138 11/16/2023 0510   K 4.2 11/16/2023 0510   CL 108 11/16/2023 0510   CO2 23 11/16/2023 0510   GLUCOSE 242 (H) 11/16/2023 0510   BUN 13 11/16/2023 0510   CREATININE 0.96 11/16/2023 0510   CALCIUM 8.0 (L) 11/16/2023 0510   GFRNONAA >60 11/16/2023 0510   GFRAA >60 09/04/2019 0249    INR    Component Value Date/Time   INR 0.9 10/23/2023 1451     Intake/Output Summary (Last 24 hours) at 11/19/2023 0738 Last data filed at 11/18/2023 1740 Gross per 24 hour  Intake 820 ml  Output --  Net 820 ml     Assessment/Plan:  77 y.o. female is s/p  right axillary -femoral bypass   4 Days Post-Op   RLE well perfused and warm  with palpable Dp  RUE well perfused with palpable radial pulse Right groin incision with SS drainage. No signs of infection. Dry gauze change twice daily or if saturated Right infraclavicular incision is healing very well Mobilize as tolerated Work with PT again today to help with strength exercises  Graceann Congress, PA-C [facility] [phone] 11/19/2023 7:38 AM  VASCULAR STAFF ADDENDUM: I have independently interviewed and examined the patient. I agree with the above.   Rande Brunt. Lenell Antu, MD [facility] Phone Number: [phone] 11/19/2023 11:32 AM

## 2023-11-20 LAB — GLUCOSE, CAPILLARY
Glucose-Capillary: 60 mg/dL — ABNORMAL LOW (ref 70–99)
Glucose-Capillary: 53 mg/dL — ABNORMAL LOW (ref 70–99)
Glucose-Capillary: 59 mg/dL — ABNORMAL LOW (ref 70–99)
Glucose-Capillary: 74 mg/dL (ref 70–99)
Glucose-Capillary: 183 mg/dL — ABNORMAL HIGH (ref 70–99)

## 2023-11-20 MED ORDER — TRAMADOL HCL 50 MG PO TABS: 50.0000 mg | ORAL_TABLET | Freq: Four times a day (QID) | ORAL | 0 refills | Status: DC | PRN

## 2023-11-20 NOTE — Plan of Care (Signed)
  Problem: Health Behavior/Discharge Planning: Goal: Ability to manage health-related needs will improve Outcome: Progressing   Problem: Clinical Measurements: Goal: Will remain free from infection Outcome: Progressing Goal: Respiratory complications will improve Outcome: Progressing   Problem: Nutrition: Goal: Adequate nutrition will be maintained Outcome: Progressing   Problem: Elimination: Goal: Will not experience complications related to urinary retention Outcome: Progressing   Problem: Pain Management: Goal: General experience of comfort will improve Outcome: Progressing   Problem: Safety: Goal: Ability to remain free from injury will improve Outcome: Progressing   Problem: Education: Goal: Knowledge of prescribed regimen will improve Outcome: Progressing   Problem: Clinical Measurements: Goal: Cardiovascular complication will be avoided Outcome: Not Progressing   Problem: Activity: Goal: Risk for activity intolerance will decrease Outcome: Not Progressing   Problem: Coping: Goal: Level of anxiety will decrease Outcome: Not Progressing

## 2023-11-20 NOTE — Progress Notes (Addendum)
 Pt cbg 60-given 4oz juice. Will continue to monitor-see chart. Mailani Degroote S Rayvn Rickerson  Recheck cbg 53-pt given 8oz of juice and a peanut butter cup per pt request. Will continue to monitor. Clive Parcel S Melonee Gerstel   Recheck 59-pt given 8oz juice. Will continue to monitor. Dayshawn Irizarry S Shemuel Harkleroad  Recheck cbg 74-Will continue to monitor on normal schedule. Corinna Burkman S Tiersa Dayley

## 2023-11-20 NOTE — TOC Transition Note (Signed)
 Transition of Care (TOC) - Discharge Note Rayfield Gobble RN, BSN Transitions of Care Unit 4E- RN Case Manager See Treatment Team for direct phone #  Patient Details  Name: Jill Higgins MRN: [id number] Date of Birth: [birth year]  Transition of Care [facility]) CM/SW Contact:  Gobble Rayfield Hurst, RN Phone Number: 11/20/2023, 3:47 PM   Clinical Narrative:    Pt for likely transition home later this afternoon.  DME- rollator has been confirmed delivered to room- by [employer].   HHPT set up with [facility]- liaison notified for start of care and to contact pt.   Pt has transportation home- no further TOC needs noted.    Final next level of care: Home w Home Health Services Barriers to Discharge: No Barriers Identified   Patient Goals and CMS Choice Patient states their goals for this hospitalization and ongoing recovery are:: return home CMS Medicare.gov Compare Post Acute Care list provided to:: Patient Choice offered to / list presented to : Patient      Discharge Placement               Home w/ [facility]        Discharge Plan and Services Additional resources added to the After Visit Summary for     Discharge Planning Services: CM Consult Post Acute Care Choice: Home Health, Durable Medical Equipment          DME Arranged: Walker rolling with seat DME Agency: [employer] Date DME Agency Contacted: 11/19/23 Time DME Agency Contacted: 1600 Representative spoke with at DME Agency: [place] HH Arranged: PT HH Agency: [facility] ([facility]) Date HH Agency Contacted: 11/16/23 Time HH Agency Contacted: 1508 Representative spoke with at [facility] Agency: [place]  Social Drivers of Health (SDOH) Interventions SDOH Screenings   Food Insecurity: No Food Insecurity (11/15/2023)  Housing: Low Risk  (11/15/2023)  Transportation Needs: No Transportation Needs (11/15/2023)  Utilities: Not At Risk (11/15/2023)  Depression (PHQ2-9): Low Risk  (11/11/2020)  Social Connections:  Moderately Isolated (11/20/2023)  Tobacco Use: Medium Risk (11/15/2023)     Readmission Risk Interventions    11/20/2023    3:47 PM  Readmission Risk Prevention Plan  Post Dischage Appt Complete  Medication Screening Complete  Transportation Screening Complete

## 2023-11-20 NOTE — Progress Notes (Addendum)
  Progress Note    11/20/2023 8:01 AM 5 Days Post-Op  Subjective:  still feeling somewhat weak in her legs but getting better. Had some groin pain on both sides yesterday when ambulating. Right groin drainage less    Vitals:   11/20/23 0408 11/20/23 0757  BP: (!) 98/52 (!) 105/56  Pulse: (!) 57 (!) 57  Resp: 17 16  Temp:  98.1 F (36.7 C)  SpO2: 100% 100%   Physical Exam: Cardiac:  regular Lungs:  non labored Incisions:  right groin incision is intact and well appearing, some SS drainage. Dry gauze dressings applied. Right infraclavicular incision is healing well Extremities:  well perfused and warm with palpable radial and DP pulses bilaterally Abdomen:  soft Neurologic: alert and oriented  CBC    Component Value Date/Time   WBC 10.8 (H) 11/16/2023 0510   RBC 3.30 (L) 11/16/2023 0510   HGB 10.3 (L) 11/16/2023 0510   HCT 31.4 (L) 11/16/2023 0510   PLT 245 11/16/2023 0510   MCV 95.2 11/16/2023 0510   MCH 31.2 11/16/2023 0510   MCHC 32.8 11/16/2023 0510   RDW 13.1 11/16/2023 0510   LYMPHSABS 2.0 01/31/2022 0105   MONOABS 0.3 01/31/2022 0105   EOSABS 0.3 01/31/2022 0105   BASOSABS 0.0 01/31/2022 0105    BMET    Component Value Date/Time   NA 138 11/16/2023 0510   K 4.2 11/16/2023 0510   CL 108 11/16/2023 0510   CO2 23 11/16/2023 0510   GLUCOSE 242 (H) 11/16/2023 0510   BUN 13 11/16/2023 0510   CREATININE 0.96 11/16/2023 0510   CALCIUM  8.0 (L) 11/16/2023 0510   GFRNONAA >60 11/16/2023 0510   GFRAA >60 09/04/2019 0249    INR    Component Value Date/Time   INR 0.9 10/23/2023 1451    No intake or output data in the 24 hours ending 11/20/23 0801   Assessment/Plan:  77 y.o. female is s/p right axillary- femoral bypass 5 Days Post-Op   RLE well perfused and warm with palpable Dp  RUE well perfused with palpable radial pulse Right groin incision with SS drainage. Slowing down.  No signs of infection. Dry gauze change twice daily or if saturated Right  infraclavicular incision is healing very well Mobilize as tolerated Possible discharge home this afternoon   Teretha Damme, [place] [facility] [phone] 11/20/2023 8:01 AM  VASCULAR STAFF ADDENDUM: I have independently interviewed and examined the patient. I agree with the above.   Debby SAILOR. Magda, MD [facility] Phone Number: [phone] 11/20/2023 1:59 PM

## 2023-11-20 NOTE — Progress Notes (Signed)
 Mobility Specialist Progress Note:   11/20/23 1053  Mobility  Activity Ambulated with assistance in hallway  Level of Assistance  (MinG)  Assistive Device Front wheel walker  Distance Ambulated (ft) 300 ft  Activity Response Tolerated well  Mobility Referral Yes  Mobility visit 1 Mobility  Mobility Specialist Start Time (ACUTE ONLY) 1015  Mobility Specialist Stop Time (ACUTE ONLY) 1025  Mobility Specialist Time Calculation (min) (ACUTE ONLY) 10 min   Pt received in bed, agreeable to mobility. Displayed improved stability during session compared to yesterday. Pt was able to tolerate increased gait distance. C/o slight BLE pain, otherwise asx throughout. VSS. Pt returned to bed with call bell in reach and all needs met.   Brown Husband  Mobility Specialist Please contact via [employer] office at [phone]

## 2023-11-20 NOTE — Inpatient Diabetes Management (Addendum)
 Inpatient Diabetes Program Recommendations  AACE/ADA: New Consensus Statement on Inpatient Glycemic Control (2015)  Target Ranges:  Prepandial:   less than 140 mg/dL      Peak postprandial:   less than 180 mg/dL (1-2 hours)      Critically ill patients:  140 - 180 mg/dL   Lab Results  Component Value Date   GLUCAP 74 11/20/2023   HGBA1C 9.5 (A) 09/27/2023    Review of Glycemic Control  Latest Reference Range & Units 11/19/23 16:23 11/19/23 21:28 11/20/23 06:09 11/20/23 06:37 11/20/23 06:47 11/20/23 06:57  Glucose-Capillary 70 - 99 mg/dL 842 (H) 872 (H) 60 (L) 53 (L) 59 (L) 74  (H): Data is abnormally high (L): Data is abnormally low  Diabetes history: DM2 Outpatient Diabetes medications: Lantus  16 units every day, Novolog  4-8 units TID, Freestyle Libre 3 Current orders for Inpatient glycemic control: Semglee  8 units QHS, Novolog  0-9 units TID, Metformin  XR 500 mg BID  Inpatient Diabetes Program Recommendations:    Please consider decreasing basal insulin  as her BG was 53 mg/dL this morning:  Semglee  4 units QHS  Will continue to follow while inpatient.  Thank you, Wyvonna Pinal, MSN, CDCES Diabetes Coordinator Inpatient Diabetes Program [phone] (team pager from 8a-5p)

## 2023-11-21 DIAGNOSIS — Z7984 Long term (current) use of oral hypoglycemic drugs: Secondary | ICD-10-CM | POA: Diagnosis not present

## 2023-11-21 DIAGNOSIS — Z48812 Encounter for surgical aftercare following surgery on the circulatory system: Secondary | ICD-10-CM | POA: Diagnosis not present

## 2023-11-21 DIAGNOSIS — Z7982 Long term (current) use of aspirin: Secondary | ICD-10-CM | POA: Diagnosis not present

## 2023-11-21 DIAGNOSIS — Z604 Social exclusion and rejection: Secondary | ICD-10-CM | POA: Diagnosis not present

## 2023-11-21 DIAGNOSIS — Z7902 Long term (current) use of antithrombotics/antiplatelets: Secondary | ICD-10-CM | POA: Diagnosis not present

## 2023-11-21 DIAGNOSIS — H409 Unspecified glaucoma: Secondary | ICD-10-CM | POA: Diagnosis not present

## 2023-11-21 DIAGNOSIS — E785 Hyperlipidemia, unspecified: Secondary | ICD-10-CM | POA: Diagnosis not present

## 2023-11-21 DIAGNOSIS — I10 Essential (primary) hypertension: Secondary | ICD-10-CM | POA: Diagnosis not present

## 2023-11-21 DIAGNOSIS — K219 Gastro-esophageal reflux disease without esophagitis: Secondary | ICD-10-CM | POA: Diagnosis not present

## 2023-11-21 DIAGNOSIS — M797 Fibromyalgia: Secondary | ICD-10-CM | POA: Diagnosis not present

## 2023-11-21 DIAGNOSIS — E1151 Type 2 diabetes mellitus with diabetic peripheral angiopathy without gangrene: Secondary | ICD-10-CM | POA: Diagnosis not present

## 2023-11-22 ENCOUNTER — Telehealth: Payer: Self-pay

## 2023-11-22 NOTE — Telephone Encounter (Signed)
..  Caller: Arlys John @ [facility]     Concern: HHPT order needed      Resolution:  Verbal Order Given:  HHPT 2 times per week x 4 wks, then 1 time per week x 4 wks.

## 2023-11-28 NOTE — Discharge Summary (Signed)
 Bypass Discharge Summary Patient ID: Jill Higgins [id number] 77 y.o. [birth year]  Admit date: 11/15/2023  Discharge date and time: 11/20/2023  2:57 PM   Admitting Physician: Debby LOISE Robertson, MD   Discharge Physician: Debby Robertson, MD  Admission Diagnoses: PAD (peripheral artery disease) [facility]) [I73.9]  Discharge Diagnoses: PAD (peripheral artery disease) (HCC) [I73.9]  Admission Condition: stable  Discharged Condition: good  Indication for Admission:  Jill Higgins is a 77 y.o. 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 After careful discussion of risks, benefits, and alternatives the patient was offered right axillary-femoral bypass. The patient understood and wished to proceed.   Hospital Course: Jill Higgins was admitted on 11/15/23 and underwent Right axillary-femoral bypass by Dr. Robertson. She tolerated the procedure well and was taken to the recovery room in stable condition. Blood pressure remained soft in the PACU so her home antihypertensive medications were held. She progressed well and was transferred to 4e floor several hours post operatively.  POD#1 she was doing very well. Pain well managed. Tolerated ambulating. PT recommending HH. Incisions all intact and well appearing without swelling or hematoma. Palpable pulses in bypass graft on right flank. Right DP palpable. Hemodynamically stable. Home Diabetes medications restarted. Home health orders placed. Arrangement made with [facility]. DME home rollator order placed as well at recommendation of PT.   POD#2, increased post operative pain mostly along tunnel track on right flank where bypass graft was tunneled. Incisions all intact and well appearing. RLE remained well perfused and warm with palpable Dp pulse. Encouraged mobilization. PT still recommending home health service. Cleared by OT. Tolerated ambulating with mobility specialist however concerns over lower extremity  pain and weakness. For this reason she felt like she could use another day or so to work with therapy teams to improve her strength.   On POD #4 she did develop some SS drainage from her right groin incision. No signs of infection. Dry gauze dressings applied. Her pain and strength slowly improving. RLE remained well perfused and warm with palpable DP and pulse in bypass graft.   The remainder of her hospital stay involved continued pain control with transition to oral pain medications, improvement of her right groin drainage, increased mobilization with increased strength, and continued increased po intake.   She remained stable for discharge home on POD#5. Her incisions all intact and well appearing. Pain well controlled. Hemodynamically stable. Home Health PT arranged with [facility]. She will continue Aspirin , Plavix  and Statin. PDMP was reviewed and post operative pain medication was sent to patients pharmacy. Follow up arranged in 1 month with ABI and Ax- fem bypass graft duplex.   Consults: None  Treatments: antibiotics: Ancef , analgesia: Morphine  and tramadol , therapies: PT, OT, RN, and SW, and surgery: right axillary-femoral bypass    Disposition: Discharge disposition: 01-Home or Self Care       - For [facility] Registry use ---  Post-op:  Wound infection: No  Graft infection: No  Transfusion: No  If yes, no units given New Arrhythmia: No Patency judged by: [ ]  Dopper only, GALERIUS.GANT ] Palpable graft pulse, GALERIUS.GANT ] Palpable distal pulse, [ ]  ABI inc. > 0.15, [ ]  Duplex D/C Ambulatory Status: Ambulatory with Assistance  Complications: MI: GALERIUS.GANT ] No, [ ]  Troponin only, [ ]  EKG or Clinical CHF: No Resp failure: GALERIUS.GANT ] none, [ ]  Pneumonia, [ ]  Ventilator Chg in renal function: GALERIUS.GANT ] none, [ ]  Inc. Cr > 0.5, [ ]  Temp.  Dialysis, [ ]  Permanent dialysis Stroke: GALERIUS.GANT ] None, [ ]  Minor, [ ]  Major Return to OR: No  Reason for return to OR: [ ]  Bleeding, [ ]  Infection, [ ]  Thrombosis, [ ]   Revision  Discharge medications: Statin use:  Yes ASA use:  Yes Plavix  use:  Yes Beta blocker use: Yes Coumadin use: No  for medical reason not indicated    Patient Instructions:  Allergies as of 11/20/2023       Reactions   Wound Dressing Adhesive Rash, Other (See Comments)   Pt states that tape and electrodes leave red scars on her skin and her skin is very sensitive.  Paper tape ok to use        Medication List     TAKE these medications    amLODipine  5 MG tablet Commonly known as: NORVASC  Take 5 mg by mouth daily.   aspirin  EC 81 MG tablet Take 81 mg by mouth at bedtime.   atorvastatin  80 MG tablet Commonly known as: Lipitor Take 1 tablet (80 mg total) by mouth daily.   Centrum Silver  50+Women Tabs Take 1 tablet by mouth daily.   clopidogrel  75 MG tablet Commonly known as: PLAVIX  Take 1 tablet (75 mg total) by mouth daily with breakfast.   diclofenac  Sodium 1 % Gel Commonly known as: VOLTAREN  Apply 1 application. topically daily as needed (leg pain).   diphenhydramine -acetaminophen  25-500 MG Tabs tablet Commonly known as: TYLENOL  PM Take 1 tablet by mouth at bedtime as needed (sleep).   DULoxetine  60 MG capsule Commonly known as: CYMBALTA  Take 60 mg by mouth daily.   ferrous sulfate  325 (65 FE) MG EC tablet Take 325 mg by mouth daily.   FreeStyle Libre 3 Plus Sensor Misc Inject 1 Device into the skin continuous. Change every 15 days   FreeStyle Libre 3 Reader [place] Use to monitor glucose continuously.   gabapentin  600 MG tablet Commonly known as: NEURONTIN  Take 600 mg by mouth 3 (three) times daily.   Insulin  Pen Needle 32G X 4 MM Misc Use 4x a day   Lantus  SoloStar 100 UNIT/ML Solostar Pen Generic drug: insulin  glargine Inject 16 Units into the skin at bedtime.   latanoprost  0.005 % ophthalmic solution Commonly known as: XALATAN  Place 1 drop into both eyes at bedtime.   losartan  50 MG tablet Commonly known as: COZAAR  Take 50 mg  by mouth daily.   Magnesium  250 MG Tabs Take 250 mg by mouth daily.   metFORMIN  500 MG 24 hr tablet Commonly known as: GLUCOPHAGE -XR Take 1 tablet (500 mg total) by mouth 2 (two) times daily with a meal.   metoprolol  succinate 100 MG 24 hr tablet Commonly known as: TOPROL -XL Take 100 mg by mouth daily. Take with or immediately following a meal.   NovoLOG  FlexPen 100 UNIT/ML FlexPen Generic drug: insulin  aspart 4-8 units with every meal   OVER THE COUNTER MEDICATION Take 1 tablet by mouth daily. Brain health supplement   oxybutynin  10 MG 24 hr tablet Commonly known as: DITROPAN -XL Take 10 mg by mouth daily.   Pancrelipase  (Lip-Prot-Amyl) 24000-76000 units Cpep Take 1 capsule (24,000 Units total) by mouth 3 (three) times daily before meals. What changed:  how much to take when to take this additional instructions   pantoprazole  40 MG tablet Commonly known as: PROTONIX  Take 40 mg by mouth 2 (two) times daily.   polyethylene glycol 17 g packet Commonly known as: MIRALAX  / GLYCOLAX  Take 17 g by mouth daily as needed.  Salonpas Pain Relief Patch Ptch Apply 1 patch topically daily as needed (pain).   temazepam  30 MG capsule Commonly known as: RESTORIL  Take 30 mg by mouth at bedtime as needed for sleep.   timolol  0.5 % ophthalmic solution Commonly known as: TIMOPTIC  Place 1 drop into both eyes every morning.   traMADol  50 MG tablet Commonly known as: ULTRAM  Take 1 tablet (50 mg total) by mouth every 6 (six) hours as needed for severe pain (pain score 7-10). What changed:  when to take this reasons to take this   traZODone  50 MG tablet Commonly known as: DESYREL  Take 75 mg by mouth at bedtime as needed for sleep.               Discharge Care Instructions  (From admission, onward)           Start     Ordered   11/20/23 0000  Discharge wound care:       Comments: Keep incisions dry for 24 hours. You can then wash with mild soap and water, pat dry.  Do not soak in bathtub   11/20/23 1409           Activity: activity as tolerated, no driving while on analgesics, and no heavy lifting for 6 weeks Diet: regular diet and low fat, low cholesterol diet Wound Care: keep wound clean and dry. Okay to shower and wash with mild soap and water. Pat dry. Place dry gauze in groin as needed to wick moisture. Change daily.  Follow-up with [facility] in 1 month with ABI and Ax-fem bypass graft duplex  Signed: Alyric Parkin 11/28/2023 10:51 AM

## 2023-12-06 ENCOUNTER — Telehealth: Payer: Self-pay

## 2023-12-06 NOTE — Telephone Encounter (Signed)
       TRIAGE:  Patient called and reports for 2 days large amounts of clear looking drainage coming from her groin.  She quantifies it as about every hour she has to change guaze and her under garments.  She states she was not sure how much drainage was ok.  She denies pain, does not endorse swelling, odor or redness.  Appointment booked for am-PA aware

## 2023-12-06 NOTE — Telephone Encounter (Signed)
TRIAGE:  Patient called and reports for 2 days large amounts of clear looking drainage coming from her groin.  She quantifies it as about every hour she has to change guaze and her under garments.  She states she was not sure how much drainage was ok.  She denies pain, does not endorse swelling, odor or redness.  Appointment booked for am-PA aware

## 2023-12-07 ENCOUNTER — Emergency Department (HOSPITAL_COMMUNITY): Payer: Medicare Other

## 2023-12-07 ENCOUNTER — Encounter (HOSPITAL_COMMUNITY): Payer: Self-pay | Admitting: Emergency Medicine

## 2023-12-07 ENCOUNTER — Inpatient Hospital Stay (HOSPITAL_COMMUNITY)
Admission: EM | Admit: 2023-12-07 | Discharge: 2023-12-19 | DRG: 252 | Disposition: A | Discharge status: Skilled Nursing Facility | Payer: Medicare Other | Attending: Internal Medicine | Admitting: Internal Medicine

## 2023-12-07 ENCOUNTER — Other Ambulatory Visit: Payer: Self-pay | Admitting: *Deleted

## 2023-12-07 ENCOUNTER — Other Ambulatory Visit: Payer: Self-pay

## 2023-12-07 DIAGNOSIS — I739 Peripheral vascular disease, unspecified: Secondary | ICD-10-CM | POA: Diagnosis not present

## 2023-12-07 DIAGNOSIS — D649 Anemia, unspecified: Secondary | ICD-10-CM | POA: Diagnosis not present

## 2023-12-07 DIAGNOSIS — M797 Fibromyalgia: Secondary | ICD-10-CM | POA: Diagnosis present

## 2023-12-07 DIAGNOSIS — E785 Hyperlipidemia, unspecified: Secondary | ICD-10-CM | POA: Diagnosis not present

## 2023-12-07 DIAGNOSIS — R7881 Bacteremia: Secondary | ICD-10-CM | POA: Diagnosis not present

## 2023-12-07 DIAGNOSIS — E11649 Type 2 diabetes mellitus with hypoglycemia without coma: Secondary | ICD-10-CM | POA: Diagnosis not present

## 2023-12-07 DIAGNOSIS — I82611 Acute embolism and thrombosis of superficial veins of right upper extremity: Secondary | ICD-10-CM | POA: Diagnosis not present

## 2023-12-07 DIAGNOSIS — R5381 Other malaise: Secondary | ICD-10-CM | POA: Diagnosis present

## 2023-12-07 DIAGNOSIS — G4700 Insomnia, unspecified: Secondary | ICD-10-CM | POA: Diagnosis not present

## 2023-12-07 DIAGNOSIS — Z681 Body mass index (BMI) 19 or less, adult: Secondary | ICD-10-CM | POA: Diagnosis not present

## 2023-12-07 DIAGNOSIS — F418 Other specified anxiety disorders: Secondary | ICD-10-CM | POA: Diagnosis present

## 2023-12-07 DIAGNOSIS — Z01810 Encounter for preprocedural cardiovascular examination: Secondary | ICD-10-CM | POA: Diagnosis not present

## 2023-12-07 DIAGNOSIS — I10 Essential (primary) hypertension: Secondary | ICD-10-CM | POA: Diagnosis present

## 2023-12-07 DIAGNOSIS — E872 Acidosis, unspecified: Secondary | ICD-10-CM | POA: Diagnosis present

## 2023-12-07 DIAGNOSIS — E1151 Type 2 diabetes mellitus with diabetic peripheral angiopathy without gangrene: Secondary | ICD-10-CM | POA: Diagnosis not present

## 2023-12-07 DIAGNOSIS — E1165 Type 2 diabetes mellitus with hyperglycemia: Secondary | ICD-10-CM | POA: Diagnosis present

## 2023-12-07 DIAGNOSIS — T827XXA Infection and inflammatory reaction due to other cardiac and vascular devices, implants and grafts, initial encounter: Principal | ICD-10-CM | POA: Diagnosis present

## 2023-12-07 DIAGNOSIS — I471 Supraventricular tachycardia, unspecified: Secondary | ICD-10-CM | POA: Diagnosis not present

## 2023-12-07 DIAGNOSIS — R600 Localized edema: Secondary | ICD-10-CM | POA: Diagnosis not present

## 2023-12-07 DIAGNOSIS — M7989 Other specified soft tissue disorders: Secondary | ICD-10-CM | POA: Diagnosis not present

## 2023-12-07 DIAGNOSIS — Y832 Surgical operation with anastomosis, bypass or graft as the cause of abnormal reaction of the patient, or of later complication, without mention of misadventure at the time of the procedure: Secondary | ICD-10-CM | POA: Diagnosis present

## 2023-12-07 DIAGNOSIS — Z87891 Personal history of nicotine dependence: Secondary | ICD-10-CM | POA: Diagnosis not present

## 2023-12-07 DIAGNOSIS — I745 Embolism and thrombosis of iliac artery: Secondary | ICD-10-CM | POA: Diagnosis present

## 2023-12-07 DIAGNOSIS — A419 Sepsis, unspecified organism: Secondary | ICD-10-CM | POA: Diagnosis not present

## 2023-12-07 DIAGNOSIS — Z7401 Bed confinement status: Secondary | ICD-10-CM | POA: Diagnosis not present

## 2023-12-07 DIAGNOSIS — R590 Localized enlarged lymph nodes: Secondary | ICD-10-CM | POA: Diagnosis not present

## 2023-12-07 DIAGNOSIS — E43 Unspecified severe protein-calorie malnutrition: Secondary | ICD-10-CM | POA: Insufficient documentation

## 2023-12-07 DIAGNOSIS — G8918 Other acute postprocedural pain: Secondary | ICD-10-CM

## 2023-12-07 DIAGNOSIS — M6281 Muscle weakness (generalized): Secondary | ICD-10-CM | POA: Diagnosis not present

## 2023-12-07 DIAGNOSIS — A4102 Sepsis due to Methicillin resistant Staphylococcus aureus: Secondary | ICD-10-CM | POA: Diagnosis present

## 2023-12-07 DIAGNOSIS — I959 Hypotension, unspecified: Secondary | ICD-10-CM | POA: Diagnosis present

## 2023-12-07 DIAGNOSIS — E871 Hypo-osmolality and hyponatremia: Secondary | ICD-10-CM | POA: Diagnosis not present

## 2023-12-07 DIAGNOSIS — B9562 Methicillin resistant Staphylococcus aureus infection as the cause of diseases classified elsewhere: Secondary | ICD-10-CM | POA: Diagnosis not present

## 2023-12-07 DIAGNOSIS — I701 Atherosclerosis of renal artery: Secondary | ICD-10-CM | POA: Diagnosis not present

## 2023-12-07 DIAGNOSIS — E1159 Type 2 diabetes mellitus with other circulatory complications: Secondary | ICD-10-CM | POA: Diagnosis present

## 2023-12-07 DIAGNOSIS — Z7902 Long term (current) use of antithrombotics/antiplatelets: Secondary | ICD-10-CM

## 2023-12-07 DIAGNOSIS — E876 Hypokalemia: Secondary | ICD-10-CM | POA: Diagnosis present

## 2023-12-07 DIAGNOSIS — Z79899 Other long term (current) drug therapy: Secondary | ICD-10-CM

## 2023-12-07 DIAGNOSIS — Z8249 Family history of ischemic heart disease and other diseases of the circulatory system: Secondary | ICD-10-CM

## 2023-12-07 DIAGNOSIS — F32A Depression, unspecified: Secondary | ICD-10-CM | POA: Diagnosis present

## 2023-12-07 DIAGNOSIS — M79674 Pain in right toe(s): Secondary | ICD-10-CM | POA: Diagnosis not present

## 2023-12-07 DIAGNOSIS — R6520 Severe sepsis without septic shock: Secondary | ICD-10-CM | POA: Diagnosis not present

## 2023-12-07 DIAGNOSIS — Z833 Family history of diabetes mellitus: Secondary | ICD-10-CM

## 2023-12-07 DIAGNOSIS — Z83438 Family history of other disorder of lipoprotein metabolism and other lipidemia: Secondary | ICD-10-CM

## 2023-12-07 DIAGNOSIS — Z803 Family history of malignant neoplasm of breast: Secondary | ICD-10-CM

## 2023-12-07 DIAGNOSIS — E119 Type 2 diabetes mellitus without complications: Secondary | ICD-10-CM | POA: Diagnosis not present

## 2023-12-07 DIAGNOSIS — G4730 Sleep apnea, unspecified: Secondary | ICD-10-CM | POA: Diagnosis not present

## 2023-12-07 DIAGNOSIS — Z794 Long term (current) use of insulin: Secondary | ICD-10-CM | POA: Diagnosis not present

## 2023-12-07 DIAGNOSIS — D62 Acute posthemorrhagic anemia: Secondary | ICD-10-CM | POA: Diagnosis not present

## 2023-12-07 DIAGNOSIS — T827XXD Infection and inflammatory reaction due to other cardiac and vascular devices, implants and grafts, subsequent encounter: Secondary | ICD-10-CM | POA: Diagnosis not present

## 2023-12-07 DIAGNOSIS — I38 Endocarditis, valve unspecified: Secondary | ICD-10-CM | POA: Diagnosis not present

## 2023-12-07 DIAGNOSIS — E8809 Other disorders of plasma-protein metabolism, not elsewhere classified: Secondary | ICD-10-CM | POA: Diagnosis present

## 2023-12-07 DIAGNOSIS — M79671 Pain in right foot: Secondary | ICD-10-CM | POA: Diagnosis present

## 2023-12-07 DIAGNOSIS — Z95828 Presence of other vascular implants and grafts: Secondary | ICD-10-CM

## 2023-12-07 DIAGNOSIS — Z820 Family history of epilepsy and other diseases of the nervous system: Secondary | ICD-10-CM

## 2023-12-07 DIAGNOSIS — Z7984 Long term (current) use of oral hypoglycemic drugs: Secondary | ICD-10-CM | POA: Diagnosis not present

## 2023-12-07 DIAGNOSIS — K861 Other chronic pancreatitis: Secondary | ICD-10-CM | POA: Diagnosis present

## 2023-12-07 DIAGNOSIS — F17290 Nicotine dependence, other tobacco product, uncomplicated: Secondary | ICD-10-CM | POA: Diagnosis present

## 2023-12-07 DIAGNOSIS — I129 Hypertensive chronic kidney disease with stage 1 through stage 4 chronic kidney disease, or unspecified chronic kidney disease: Secondary | ICD-10-CM | POA: Diagnosis present

## 2023-12-07 DIAGNOSIS — Z7982 Long term (current) use of aspirin: Secondary | ICD-10-CM

## 2023-12-07 DIAGNOSIS — N189 Chronic kidney disease, unspecified: Secondary | ICD-10-CM | POA: Diagnosis not present

## 2023-12-07 DIAGNOSIS — F419 Anxiety disorder, unspecified: Secondary | ICD-10-CM | POA: Diagnosis present

## 2023-12-07 DIAGNOSIS — R531 Weakness: Secondary | ICD-10-CM | POA: Diagnosis not present

## 2023-12-07 DIAGNOSIS — L089 Local infection of the skin and subcutaneous tissue, unspecified: Principal | ICD-10-CM

## 2023-12-07 DIAGNOSIS — T8149XA Infection following a procedure, other surgical site, initial encounter: Secondary | ICD-10-CM | POA: Diagnosis not present

## 2023-12-07 DIAGNOSIS — Z4689 Encounter for fitting and adjustment of other specified devices: Secondary | ICD-10-CM | POA: Diagnosis not present

## 2023-12-07 LAB — COMPREHENSIVE METABOLIC PANEL
ALT: 37 U/L (ref 0–44)
AST: 84 U/L — ABNORMAL HIGH (ref 15–41)
Albumin: 2.4 g/dL — ABNORMAL LOW (ref 3.5–5.0)
Alkaline Phosphatase: 71 U/L (ref 38–126)
Anion gap: 15 (ref 5–15)
BUN: 10 mg/dL (ref 8–23)
CO2: 21 mmol/L — ABNORMAL LOW (ref 22–32)
Calcium: 8 mg/dL — ABNORMAL LOW (ref 8.9–10.3)
Chloride: 100 mmol/L (ref 98–111)
Creatinine, Ser: 1.15 mg/dL — ABNORMAL HIGH (ref 0.44–1.00)
GFR, Estimated: 49 mL/min — ABNORMAL LOW (ref >60)
Glucose, Bld: 232 mg/dL — ABNORMAL HIGH (ref 70–99)
Potassium: 3.7 mmol/L (ref 3.5–5.1)
Sodium: 136 mmol/L (ref 135–145)
Total Bilirubin: 0.6 mg/dL (ref 0.0–1.2)
Total Protein: 5.7 g/dL — ABNORMAL LOW (ref 6.5–8.1)

## 2023-12-07 LAB — CBC WITH DIFFERENTIAL/PLATELET
Abs Immature Granulocytes: 1.57 10*3/uL — ABNORMAL HIGH (ref 0.00–0.07)
Basophils Absolute: 0.1 10*3/uL (ref 0.0–0.1)
Basophils Relative: 0 %
Eosinophils Absolute: 0 10*3/uL (ref 0.0–0.5)
Eosinophils Relative: 0 %
HCT: 39.4 % (ref 36.0–46.0)
Hemoglobin: 13 g/dL (ref 12.0–15.0)
Immature Granulocytes: 6 %
Lymphocytes Relative: 6 %
Lymphs Abs: 1.6 10*3/uL (ref 0.7–4.0)
MCH: 31.3 pg (ref 26.0–34.0)
MCHC: 33 g/dL (ref 30.0–36.0)
MCV: 94.7 fL (ref 80.0–100.0)
Monocytes Absolute: 0.8 10*3/uL (ref 0.1–1.0)
Monocytes Relative: 3 %
Neutro Abs: 20.9 10*3/uL — ABNORMAL HIGH (ref 1.7–7.7)
Neutrophils Relative %: 85 %
Platelets: 281 10*3/uL (ref 150–400)
RBC: 4.16 MIL/uL (ref 3.87–5.11)
RDW: 14.1 % (ref 11.5–15.5)
WBC: 24.9 10*3/uL — ABNORMAL HIGH (ref 4.0–10.5)
nRBC: 0 % (ref 0.0–0.2)

## 2023-12-07 LAB — I-STAT CG4 LACTIC ACID, ED: Lactic Acid, Venous: 5.7 mmol/L (ref 0.5–1.9)

## 2023-12-07 LAB — I-STAT CHEM 8, ED
BUN: 9 mg/dL (ref 8–23)
Calcium, Ion: 0.99 mmol/L — ABNORMAL LOW (ref 1.15–1.40)
Chloride: 99 mmol/L (ref 98–111)
Creatinine, Ser: 0.9 mg/dL (ref 0.44–1.00)
Glucose, Bld: 220 mg/dL — ABNORMAL HIGH (ref 70–99)
HCT: 39 % (ref 36.0–46.0)
Hemoglobin: 13.3 g/dL (ref 12.0–15.0)
Potassium: 3.7 mmol/L (ref 3.5–5.1)
Sodium: 135 mmol/L (ref 135–145)
TCO2: 21 mmol/L — ABNORMAL LOW (ref 22–32)

## 2023-12-07 LAB — PROTIME-INR
INR: 1.3 — ABNORMAL HIGH (ref 0.8–1.2)
Prothrombin Time: 16.3 s — ABNORMAL HIGH (ref 11.4–15.2)

## 2023-12-07 MED ORDER — IOHEXOL 350 MG/ML SOLN
75.0000 mL | Freq: Once | INTRAVENOUS | Status: AC | PRN
  Administered 2023-12-07: 80 mL via INTRAVENOUS

## 2023-12-07 MED ORDER — TEMAZEPAM 15 MG PO CAPS
30.0000 mg | ORAL_CAPSULE | Freq: Every evening | ORAL | Status: DC | PRN
  Administered 2023-12-10 – 2023-12-18 (×8): 30 mg via ORAL
  Filled 2023-12-07 (×8): qty 2

## 2023-12-07 MED ORDER — CLOPIDOGREL BISULFATE 75 MG PO TABS
75.0000 mg | ORAL_TABLET | Freq: Every day | ORAL | Status: DC
  Administered 2023-12-08 – 2023-12-19 (×11): 75 mg via ORAL
  Filled 2023-12-07 (×11): qty 1

## 2023-12-07 MED ORDER — SODIUM CHLORIDE 0.9 % IV BOLUS
1000.0000 mL | Freq: Once | INTRAVENOUS | Status: AC
  Administered 2023-12-07: 1000 mL via INTRAVENOUS

## 2023-12-07 MED ORDER — ACETAMINOPHEN 325 MG PO TABS
650.0000 mg | ORAL_TABLET | Freq: Four times a day (QID) | ORAL | Status: DC | PRN
  Administered 2023-12-12 (×2): 650 mg via ORAL
  Filled 2023-12-07 (×2): qty 2

## 2023-12-07 MED ORDER — VANCOMYCIN HCL IN DEXTROSE 1-5 GM/200ML-% IV SOLN
1000.0000 mg | Freq: Once | INTRAVENOUS | Status: AC
  Administered 2023-12-07: 1000 mg via INTRAVENOUS
  Filled 2023-12-07: qty 200

## 2023-12-07 MED ORDER — PROCHLORPERAZINE EDISYLATE 10 MG/2ML IJ SOLN: 5.0000 mg | INTRAMUSCULAR | Status: DC | PRN

## 2023-12-07 MED ORDER — SODIUM CHLORIDE 0.9% FLUSH
3.0000 mL | Freq: Two times a day (BID) | INTRAVENOUS | Status: DC
  Administered 2023-12-08 – 2023-12-18 (×17): 3 mL via INTRAVENOUS

## 2023-12-07 MED ORDER — POLYETHYLENE GLYCOL 3350 17 G PO PACK
17.0000 g | PACK | Freq: Every day | ORAL | Status: DC | PRN
  Filled 2023-12-07: qty 1

## 2023-12-07 MED ORDER — ACETAMINOPHEN 650 MG RE SUPP: 650.0000 mg | Freq: Four times a day (QID) | RECTAL | Status: DC | PRN

## 2023-12-07 MED ORDER — OXYCODONE HCL 5 MG PO TABS
5.0000 mg | ORAL_TABLET | ORAL | Status: DC | PRN
  Administered 2023-12-07 – 2023-12-09 (×5): 5 mg via ORAL
  Filled 2023-12-07 (×5): qty 1

## 2023-12-07 MED ORDER — ATORVASTATIN CALCIUM 80 MG PO TABS
80.0000 mg | ORAL_TABLET | Freq: Every day | ORAL | Status: DC
Start: 2023-12-08 — End: 2023-12-19
  Administered 2023-12-08 – 2023-12-19 (×10): 80 mg via ORAL
  Filled 2023-12-07 (×8): qty 1
  Filled 2023-12-07: qty 2
  Filled 2023-12-07: qty 1

## 2023-12-07 MED ORDER — ASPIRIN 81 MG PO TBEC
81.0000 mg | DELAYED_RELEASE_TABLET | Freq: Every day | ORAL | Status: DC
  Administered 2023-12-07 – 2023-12-18 (×12): 81 mg via ORAL
  Filled 2023-12-07 (×12): qty 1

## 2023-12-07 MED ORDER — PANTOPRAZOLE SODIUM 40 MG PO TBEC
40.0000 mg | DELAYED_RELEASE_TABLET | Freq: Two times a day (BID) | ORAL | Status: DC
  Administered 2023-12-08 – 2023-12-19 (×21): 40 mg via ORAL
  Filled 2023-12-07 (×5): qty 1
  Filled 2023-12-07: qty 2
  Filled 2023-12-07 (×15): qty 1

## 2023-12-07 MED ORDER — VANCOMYCIN HCL 750 MG IV SOLR
750.0000 mg | INTRAVENOUS | Status: DC
  Administered 2023-12-08 – 2023-12-09 (×2): 750 mg via INTRAVENOUS
  Filled 2023-12-07 (×2): qty 15

## 2023-12-07 MED ORDER — SODIUM CHLORIDE 0.9 % IV SOLN
2.0000 g | Freq: Two times a day (BID) | INTRAVENOUS | Status: DC
  Administered 2023-12-07 – 2023-12-08 (×2): 2 g via INTRAVENOUS
  Filled 2023-12-07 (×2): qty 12.5

## 2023-12-07 MED ORDER — INSULIN ASPART 100 UNIT/ML IJ SOLN
0.0000 [IU] | INTRAMUSCULAR | Status: DC
  Administered 2023-12-08 – 2023-12-09 (×2): 1 [IU] via SUBCUTANEOUS
  Administered 2023-12-09: 3 [IU] via SUBCUTANEOUS
  Administered 2023-12-10: 2 [IU] via SUBCUTANEOUS

## 2023-12-07 MED ORDER — FENTANYL CITRATE PF 50 MCG/ML IJ SOSY
12.5000 ug | PREFILLED_SYRINGE | INTRAMUSCULAR | Status: DC | PRN
  Administered 2023-12-08 – 2023-12-11 (×10): 50 ug via INTRAVENOUS
  Filled 2023-12-07 (×10): qty 1

## 2023-12-07 NOTE — Progress Notes (Signed)
Pharmacy Antibiotic Note  BALBINA GUTTER is a 78 y.o. female admitted on 12/07/2023 with sepsis, possibly 2/2 incision site infection s/p femoral/popliteal bypass surgery 12/26.  Pharmacy has been consulted for cefepime dosing.  Renal function at baseline.  Plan: Start cefepime 2g IV Q12H Vancomycin 1g x1 per provider F/u clinical progression, cultures, and renal function  Weight: 50 kg (110 lb 3.7 oz)  Temp (24hrs), Avg:97.7 F (36.5 C), Min:97.7 F (36.5 C), Max:97.7 F (36.5 C)  Recent Labs  Lab 12/07/23 2024 12/07/23 2030 12/07/23 2036  WBC 24.9*  --   --   CREATININE  --  0.90  --   LATICACIDVEN  --   --  5.7*    Estimated Creatinine Clearance: 40.7 mL/min (by C-G formula based on SCr of 0.9 mg/dL).    Allergies  Allergen Reactions   Wound Dressing Adhesive Rash and Other (See Comments)    Pt states that tape and electrodes leave red "scars" on her skin and her skin is very sensitive.  Paper tape ok to use   Thank you for allowing pharmacy to be a part of this patient's care.  Jenita Seashore 12/07/2023 8:53 PM

## 2023-12-07 NOTE — Sepsis Progress Note (Signed)
Notified bedside nurse of need to draw repeat lactic acid. 

## 2023-12-07 NOTE — ED Triage Notes (Signed)
Pt had a femoral/popliteal bypass surgery 12/26. Pt states 2 days ago incision started to drain and become more painful. Pt friend states pt looks more pale than usual. Denies any fevers or chills.

## 2023-12-07 NOTE — Sepsis Progress Note (Signed)
Elink monitoring for the code sepsis protocol.  

## 2023-12-07 NOTE — ED Provider Notes (Signed)
[place] EMERGENCY DEPARTMENT AT [facility] Provider Note   CSN: [id number] Arrival date & time: 12/07/23  1951     History  Chief Complaint  Patient presents with   Post-op Problem    Jill Higgins is a 78 y.o. female history of diabetes, hypertension, known claudication s/p R axillary/femoral bypass on 12/26 by Dr. Lenell Antu here with chills, drainage from R groin wound. Jill Higgins states that Jill Higgins noticed increased drainage R groin 2-3 days ago. Jill Higgins has subjective chills. Was noted to be hypotensive in triage. Code sepsis initiated.   The history is provided by the patient.       Home Medications Prior to Admission medications   Medication Sig Start Date End Date Taking? Authorizing Provider  amLODipine (NORVASC) 5 MG tablet Take 5 mg by mouth daily.    [provider]  aspirin EC 81 MG tablet Take 81 mg by mouth at bedtime.    [provider]  atorvastatin (LIPITOR) 80 MG tablet Take 1 tablet (80 mg total) by mouth daily. 04/21/23 04/20/24  Baglia, Corrina, PA-C  clopidogrel (PLAVIX) 75 MG tablet Take 1 tablet (75 mg total) by mouth daily with breakfast. 04/22/23   Baglia, Corrina, PA-C  Continuous Glucose Receiver (FREESTYLE LIBRE 3 READER) DEVI Use to monitor glucose continuously. 09/24/23   Carlus Pavlov, MD  Continuous Glucose Sensor (FREESTYLE LIBRE 3 PLUS SENSOR) MISC Inject 1 Device into the skin continuous. Change every 15 days 09/27/23   Carlus Pavlov, MD  diclofenac Sodium (VOLTAREN) 1 % GEL Apply 1 application. topically daily as needed (leg pain). 01/05/22   [provider]  diphenhydramine-acetaminophen (TYLENOL PM) 25-500 MG TABS tablet Take 1 tablet by mouth at bedtime as needed (sleep).    [provider]  DULoxetine (CYMBALTA) 60 MG capsule Take 60 mg by mouth daily. 04/19/22   [provider]  ferrous sulfate 325 (65 FE) MG EC tablet Take 325 mg by mouth daily.    [provider]  gabapentin (NEURONTIN)  600 MG tablet Take 600 mg by mouth 3 (three) times daily. 07/21/23   [provider]  insulin aspart (NOVOLOG FLEXPEN) 100 UNIT/ML FlexPen 4-8 units with every meal 07/10/23   Carlus Pavlov, MD  insulin glargine (LANTUS SOLOSTAR) 100 UNIT/ML Solostar Pen Inject 16 Units into the skin at bedtime. 11/08/23   Carlus Pavlov, MD  Insulin Pen Needle 32G X 4 MM MISC Use 4x a day 09/27/23   Carlus Pavlov, MD  latanoprost (XALATAN) 0.005 % ophthalmic solution Place 1 drop into both eyes at bedtime. 04/16/22   [provider]  lipase/protease/amylase 24000-76000 units CPEP Take 1 capsule (24,000 Units total) by mouth 3 (three) times daily before meals. Patient taking differently: Take 24,000-48,000 Units by mouth See admin instructions. Take 2 tablet before meals and 1 tablet after meals. 01/31/22   Burnadette Pop, MD  losartan (COZAAR) 50 MG tablet Take 50 mg by mouth daily. 09/18/23   [provider]  Magnesium 250 MG TABS Take 250 mg by mouth daily.    [provider]  Menthol-Methyl Salicylate (SALONPAS PAIN RELIEF PATCH) PTCH Apply 1 patch topically daily as needed (pain).    [provider]  metFORMIN (GLUCOPHAGE-XR) 500 MG 24 hr tablet Take 1 tablet (500 mg total) by mouth 2 (two) times daily with a meal. 07/20/23   Carlus Pavlov, MD  metoprolol succinate (TOPROL-XL) 100 MG 24 hr tablet Take 100 mg by mouth daily. Take with or immediately following a meal.  [provider]  Multiple Vitamins-Minerals (CENTRUM SILVER 50+WOMEN) TABS Take 1 tablet by mouth daily.    [provider]  OVER THE COUNTER MEDICATION Take 1 tablet by mouth daily. Brain health supplement    [provider]  oxybutynin (DITROPAN-XL) 10 MG 24 hr tablet Take 10 mg by mouth daily. 07/25/23   [provider]  pantoprazole (PROTONIX) 40 MG tablet Take 40 mg by mouth 2 (two) times daily. 10/25/15   [provider]  polyethylene glycol  (MIRALAX / GLYCOLAX) 17 g packet Take 17 g by mouth daily as needed. 01/31/22   Burnadette Pop, MD  temazepam (RESTORIL) 30 MG capsule Take 30 mg by mouth at bedtime as needed for sleep. 04/15/18   [provider]  timolol (TIMOPTIC) 0.5 % ophthalmic solution Place 1 drop into both eyes every morning. 12/20/21   [provider]  traMADol (ULTRAM) 50 MG tablet Take 1 tablet (50 mg total) by mouth every 6 (six) hours as needed for severe pain (pain score 7-10). 11/20/23   Baglia, Corrina, PA-C  traZODone (DESYREL) 50 MG tablet Take 75 mg by mouth at bedtime as needed for sleep.    [provider]      Allergies    Wound dressing adhesive    Review of Systems   Review of Systems  Constitutional:  Positive for chills.  Skin:  Positive for wound.  All other systems reviewed and are negative.   Physical Exam Updated Vital Signs BP (!) 135/117 (BP Location: Right Arm)   Pulse 85   Temp 97.7 F (36.5 C)   Resp (!) 26   Wt 50 kg   SpO2 98%   BMI 19.53 kg/m  Physical Exam Vitals and nursing note reviewed.  HENT:     Head: Normocephalic.     Nose: Nose normal.  Eyes:     Extraocular Movements: Extraocular movements intact.     Pupils: Pupils are equal, round, and reactive to light.  Cardiovascular:     Rate and Rhythm: Normal rate.     Pulses: Normal pulses.     Comments: Right chest area with a bypass wound with no obvious purulent drainage Pulmonary:     Effort: Pulmonary effort is normal.     Breath sounds: Normal breath sounds.  Abdominal:     General: Abdomen is flat.     Palpations: Abdomen is soft.     Comments: Right lower quadrant bypass wound with purulent drainage.  Patient has some fluctuance there as well.  Musculoskeletal:     Cervical back: Normal range of motion and neck supple.     Comments: Patient has very poor pulses bilateral legs.  Per patient this is chronic  Skin:    General: Skin is warm.     Capillary Refill: Capillary refill  takes less than 2 seconds.  Neurological:     General: No focal deficit present.     Mental Status: Jill Higgins is alert and oriented to person, place, and time.  Psychiatric:        Mood and Affect: Mood normal.        Behavior: Behavior normal.     ED Results / Procedures / Treatments   Labs (all labs ordered are listed, but only abnormal results are displayed) Labs Reviewed  COMPREHENSIVE METABOLIC PANEL - Abnormal; Notable for the following components:      Result Value   CO2 21 (*)    Glucose, Bld 232 (*)    Creatinine,  Ser 1.15 (*)    Calcium 8.0 (*)    Total Protein 5.7 (*)    Albumin 2.4 (*)    AST 84 (*)    GFR, Estimated 49 (*)    All other components within normal limits  CBC WITH DIFFERENTIAL/PLATELET - Abnormal; Notable for the following components:   WBC 24.9 (*)    Neutro Abs 20.9 (*)    Abs Immature Granulocytes 1.57 (*)    All other components within normal limits  PROTIME-INR - Abnormal; Notable for the following components:   Prothrombin Time 16.3 (*)    INR 1.3 (*)    All other components within normal limits  I-STAT CG4 LACTIC ACID, ED - Abnormal; Notable for the following components:   Lactic Acid, Venous 5.7 (*)    All other components within normal limits  I-STAT CHEM 8, ED - Abnormal; Notable for the following components:   Glucose, Bld 220 (*)    Calcium, Ion 0.99 (*)    TCO2 21 (*)    All other components within normal limits  CULTURE, BLOOD (ROUTINE X 2)  CULTURE, BLOOD (ROUTINE X 2)  URINALYSIS, W/ REFLEX TO CULTURE (INFECTION SUSPECTED)    EKG None  Radiology DG Chest 2 View Result Date: 12/07/2023 CLINICAL DATA:  Recent vascular surgery, drainage from incision, concern for sepsis EXAM: CHEST - 2 VIEW COMPARISON:  09/01/2019 FINDINGS: Frontal and lateral views of the chest demonstrate an unremarkable cardiac silhouette. No acute airspace disease, effusion, or pneumothorax. No acute bony abnormalities. IMPRESSION: 1. No acute intrathoracic  process. Electronically Signed   By: Sharlet Salina M.D.   On: 12/07/2023 20:39    Procedures Procedures    CRITICAL CARE Performed by: Richardean Canal   Total critical care time: 39 minutes  Critical care time was exclusive of separately billable procedures and treating other patients.  Critical care was necessary to treat or prevent imminent or life-threatening deterioration.  Critical care was time spent personally by me on the following activities: development of treatment plan with patient and/or surrogate as well as nursing, discussions with consultants, evaluation of patient's response to treatment, examination of patient, obtaining history from patient or surrogate, ordering and performing treatments and interventions, ordering and review of laboratory studies, ordering and review of radiographic studies, pulse oximetry and re-evaluation of patient's condition.   Medications Ordered in ED Medications  vancomycin (VANCOCIN) IVPB 1000 mg/200 mL premix (has no administration in time range)  ceFEPIme (MAXIPIME) 2 g in sodium chloride 0.9 % 100 mL IVPB (2 g Intravenous New Bag/Given 12/07/23 2116)  sodium chloride 0.9 % bolus 1,000 mL (1,000 mLs Intravenous New Bag/Given 12/07/23 2051)  sodium chloride 0.9 % bolus 1,000 mL (1,000 mLs Intravenous New Bag/Given 12/07/23 2128)    ED Course/ Medical Decision Making/ A&P                                 Medical Decision Making BREEANA FUGUA is a 78 y.o. female here presenting with pain over the right bypass site.  And concern for bypass infection.  Patient was hypotensive and tachycardic initially.  Code sepsis initiated.  9:30 pm Reviewed patient's labs and white blood cell count is 24,000.  Lactate is 5.7.  Chest x-ray is clear.  I discussed case with Dr. Chestine Spore from vascular surgery.  He states that he will come and see the patient and recommends CTA chest abdomen pelvis  11:03  PM Signed out to Dr. Jacqulyn Bath to follow up CTA and admit  patient. If CTA showed surgical issue, then consult Dr. Chestine Spore again.     Amount and/or Complexity of Data Reviewed Labs: ordered. Decision-making details documented in ED Course. Radiology: ordered and independent interpretation performed. Decision-making details documented in ED Course.  Risk Prescription drug management.    Final Clinical Impression(s) / ED Diagnoses Final diagnoses:  None    Rx / DC Orders ED Discharge Orders     None         Charlynne Pander, MD 12/07/23 2304

## 2023-12-07 NOTE — H&P (Signed)
History and Physical    TRYNITEE FORE ZOX:[id number] DOB: [birth year] DOA: 12/07/2023  PCP: Merri Brunette, MD   Patient coming from: Home   Chief Complaint: Drainage from groin incision, chills   HPI: SIMEON CUTTINO is a 78 y.o. female with medical history significant for hypertension, type 2 diabetes mellitus, depression, anxiety, PAD and PAD with thrombosis of right external iliac/common femoral stent and disabling claudication status-post right axillary-femoral bypass who presents to the ED with chills and drainage from groin incision.  Patient underwent right axillary to femoral bypass on 11/15/2023 for disabling claudication, was discharged in good condition on 11/20/2023, and had been doing well back at home initially.  She began noticing drainage from the right groin incision 3 days ago and has had increasing pain at the incision site.  She has been feeling generally poor with loss of appetite but denies chest pain or shortness of breath.  ED Course: Upon arrival to the ED, patient is found to be afebrile and saturating well on room air with mild tachypnea, mild tachycardia, and fluid-responsive hypotension. Labs are most notable for glucose 232, albumin 2.4, WBC 24,900, and lactic acid 5.7.   Vascular surgery was consulted by the ED physician, blood cultures were collected, and the patient was given 2 L of saline, vancomycin, and cefepime.  Review of Systems:  All other systems reviewed and apart from HPI, are negative.  Past Medical History:  Diagnosis Date   Bronchitis    Bulging disc    in neck   Depression with anxiety    Diabetes mellitus    Type II   Fibromyalgia    pt. denies   GERD (gastroesophageal reflux disease)    Glaucoma    History of bronchitis    Hyperlipidemia    Hypertension    Overactive bladder    Peripheral vascular disease (HCC)    Pneumonia    Sleep apnea    recent test negative for sleep apnea   Tobacco abuse    Urinary frequency     Past  Surgical History:  Procedure Laterality Date   ABDOMINAL AORTAGRAM N/A 08/15/2013   Procedure: ABDOMINAL Ronny Flurry;  Surgeon: Sherren Kerns, MD;  Location: [facility] CATH LAB;  Service: Cardiovascular;  Laterality: N/A;   ABDOMINAL AORTAGRAM N/A 06/19/2014   Procedure: ABDOMINAL Ronny Flurry;  Surgeon: Sherren Kerns, MD;  Location: [facility] CATH LAB;  Service: Cardiovascular;  Laterality: N/A;   ABDOMINAL AORTOGRAM W/LOWER EXTREMITY N/A 05/10/2018   Procedure: ABDOMINAL AORTOGRAM W/LOWER EXTREMITY;  Surgeon: Sherren Kerns, MD;  Location: [facility] INVASIVE CV LAB;  Service: Cardiovascular;  Laterality: N/A;  bilateral   ABDOMINAL AORTOGRAM W/LOWER EXTREMITY N/A 04/20/2023   Procedure: ABDOMINAL AORTOGRAM W/LOWER EXTREMITY;  Surgeon: Leonie Douglas, MD;  Location: [facility] INVASIVE CV LAB;  Service: Cardiovascular;  Laterality: N/A;   Aortogram w/ PTA  05/14/08,  11-04-10   Bilateral aortogram w/ bilateral  SFA PTA  stenting    AXILLARY-FEMORAL BYPASS GRAFT Right 11/15/2023   Procedure: RIGHT AXILLO-FEMORAL BYPASS GRAFT;  Surgeon: Leonie Douglas, MD;  Location: [facility] OR;  Service: Vascular;  Laterality: Right;   BREAST SURGERY Right    boil removal   BUNIONECTOMY     L foot in the 1980s   COLONOSCOPY     2014   DILATION AND CURETTAGE OF UTERUS     X4   ENDARTERECTOMY FEMORAL Right 06/03/2018   Procedure: RIGHT FEMORAL ENDARTERECTOMY;  Surgeon: Sherren Kerns, MD;  Location: [facility] OR;  Service:  Vascular;  Laterality: Right;   Epidural shots in neck      ESOPHAGOGASTRODUODENOSCOPY (EGD) WITH PROPOFOL N/A 09/15/2019   Procedure: ESOPHAGOGASTRODUODENOSCOPY (EGD) WITH PROPOFOL;  Surgeon: Willis Modena, MD;  Location: [facility] ENDOSCOPY;  Service: Endoscopy;  Laterality: N/A;   EUS N/A 09/15/2019   Procedure: UPPER ENDOSCOPIC ULTRASOUND (EUS) LINEAR;  Surgeon: Willis Modena, MD;  Location: [facility] ENDOSCOPY;  Service: Endoscopy;  Laterality: N/A;   EYE SURGERY Bilateral 2016   cataract removal   FEMORAL-POPLITEAL BYPASS  GRAFT Left 10/21/2013   Procedure: LEFT FEMORAL-POPLITEAL ARTERY BYPASS WITH SAPHENOUS VEIN GRAFT , POPLITEAL ENDARTERECTOMY ,INTRAOPERATIVE ARTERIOGRAM, vein patch angioplasty to popliteal artery;  Surgeon: Sherren Kerns, MD;  Location: [facility] OR;  Service: Vascular;  Laterality: Left;   FEMORAL-POPLITEAL BYPASS GRAFT Right 02/08/2016   Procedure: Right FEMORAL- to Above Knee POPLITEAL ARTERY Bypass Graft with reversed saphenous vein and Common Femoral Endarterectomy  with profundoplasty;  Surgeon: Sherren Kerns, MD;  Location: [facility] OR;  Service: Vascular;  Laterality: Right;   FINE NEEDLE ASPIRATION N/A 09/15/2019   Procedure: FINE NEEDLE ASPIRATION (FNA) LINEAR;  Surgeon: Willis Modena, MD;  Location: [facility] ENDOSCOPY;  Service: Endoscopy;  Laterality: N/A;   GROIN DEBRIDEMENT Left 10/28/2013   Procedure: left inner thigh DEBRIDEMENT;  Surgeon: Sherren Kerns, MD;  Location: [facility] OR;  Service: Vascular;  Laterality: Left;   HAND SURGERY Left 2010   LOWER EXTREMITY ANGIOGRAM Bilateral 12/24/2015   Procedure: Lower Extremity Angiogram;  Surgeon: Sherren Kerns, MD;  Location: [facility] INVASIVE CV LAB;  Service: Cardiovascular;  Laterality: Bilateral;   PATCH ANGIOPLASTY Right 06/03/2018   Procedure: PATCH ANGIOPLASTY USING A XENOSURE 1CM X 14CM BIOLOGIC PATCH;  Surgeon: Sherren Kerns, MD;  Location: [facility] OR;  Service: Vascular;  Laterality: Right;   PERIPHERAL VASCULAR CATHETERIZATION N/A 12/24/2015   Procedure: Abdominal Aortogram;  Surgeon: Sherren Kerns, MD;  Location: [facility] INVASIVE CV LAB;  Service: Cardiovascular;  Laterality: N/A;   PERIPHERAL VASCULAR CATHETERIZATION Left 12/24/2015   Procedure: Peripheral Vascular Balloon Angioplasty;  Surgeon: Sherren Kerns, MD;  Location: [facility] INVASIVE CV LAB;  Service: Cardiovascular;  Laterality: Left;  drug coated balloon   PERIPHERAL VASCULAR CATHETERIZATION N/A 06/30/2016   Procedure: Abdominal Aortogram;  Surgeon: Sherren Kerns, MD;  Location: [facility] INVASIVE CV  LAB;  Service: Cardiovascular;  Laterality: N/A;   PERIPHERAL VASCULAR CATHETERIZATION Bilateral 06/30/2016   Procedure: Lower Extremity Angiography;  Surgeon: Sherren Kerns, MD;  Location: [facility] INVASIVE CV LAB;  Service: Cardiovascular;  Laterality: Bilateral;   TONSILLECTOMY      Social History:   reports that she quit smoking about 9 years ago. Her smoking use included cigarettes. She started smoking about 5 months ago. She has a 0.1 pack-year smoking history. She has never been exposed to tobacco smoke. She has never used smokeless tobacco. She reports current alcohol use. She reports that she does not use drugs.  Allergies  Allergen Reactions   Wound Dressing Adhesive Rash and Other (See Comments)    Pt states that tape and electrodes leave red "scars" on her skin and her skin is very sensitive.  Paper tape ok to use    Family History  Problem Relation Age of Onset   Heart disease Father    Heart attack Father        MI at age 79   Hypertension Mother    Alzheimer's disease Mother    Hypertension Sister    Diabetes Brother    Hyperlipidemia Brother  Hypertension Brother    Heart disease Brother    Heart attack Brother    Breast cancer Maternal Aunt      Prior to Admission medications   Medication Sig Start Date End Date Taking? Authorizing Provider  amLODipine (NORVASC) 5 MG tablet Take 5 mg by mouth daily.   Yes [provider]  aspirin EC 81 MG tablet Take 81 mg by mouth at bedtime.   Yes [provider]  atorvastatin (LIPITOR) 80 MG tablet Take 1 tablet (80 mg total) by mouth daily. 04/21/23 04/20/24 Yes Baglia, Corrina, PA-C  clopidogrel (PLAVIX) 75 MG tablet Take 1 tablet (75 mg total) by mouth daily with breakfast. 04/22/23  Yes Baglia, Corrina, PA-C  losartan (COZAAR) 50 MG tablet Take 50 mg by mouth daily. 09/18/23  Yes [provider]  Magnesium 250 MG TABS Take 250 mg by mouth daily.   Yes [provider]  metFORMIN  (GLUCOPHAGE-XR) 500 MG 24 hr tablet Take 1 tablet (500 mg total) by mouth 2 (two) times daily with a meal. 07/20/23  Yes Carlus Pavlov, MD  metoprolol succinate (TOPROL-XL) 100 MG 24 hr tablet Take 100 mg by mouth daily. Take with or immediately following a meal.   Yes [provider]  Multiple Vitamins-Minerals (CENTRUM SILVER 50+WOMEN) TABS Take 1 tablet by mouth daily.   Yes [provider]  OVER THE COUNTER MEDICATION Take 1 tablet by mouth daily. Brain health supplement   Yes [provider]  oxybutynin (DITROPAN-XL) 10 MG 24 hr tablet Take 10 mg by mouth daily. 07/25/23  Yes [provider]  pantoprazole (PROTONIX) 40 MG tablet Take 40 mg by mouth 2 (two) times daily. 10/25/15  Yes [provider]  temazepam (RESTORIL) 30 MG capsule Take 30 mg by mouth at bedtime as needed for sleep. 04/15/18  Yes [provider]  timolol (TIMOPTIC) 0.5 % ophthalmic solution Place 1 drop into both eyes every morning. 12/20/21  Yes [provider]  traMADol (ULTRAM) 50 MG tablet Take 1 tablet (50 mg total) by mouth every 6 (six) hours as needed for severe pain (pain score 7-10). 11/20/23  Yes Baglia, Corrina, PA-C  traZODone (DESYREL) 50 MG tablet Take 75 mg by mouth at bedtime as needed for sleep.   Yes [provider]  Continuous Glucose Receiver (FREESTYLE LIBRE 3 READER) DEVI Use to monitor glucose continuously. 09/24/23   Carlus Pavlov, MD  Continuous Glucose Sensor (FREESTYLE LIBRE 3 PLUS SENSOR) MISC Inject 1 Device into the skin continuous. Change every 15 days 09/27/23   Carlus Pavlov, MD  diclofenac Sodium (VOLTAREN) 1 % GEL Apply 1 application. topically daily as needed (leg pain). 01/05/22   [provider]  diphenhydramine-acetaminophen (TYLENOL PM) 25-500 MG TABS tablet Take 1 tablet by mouth at bedtime as needed (sleep).    [provider]  DULoxetine (CYMBALTA) 60 MG capsule Take 60 mg by mouth daily.  04/19/22   [provider]  ferrous sulfate 325 (65 FE) MG EC tablet Take 325 mg by mouth daily.    [provider]  gabapentin (NEURONTIN) 600 MG tablet Take 600 mg by mouth 3 (three) times daily. 07/21/23   [provider]  insulin aspart (NOVOLOG FLEXPEN) 100 UNIT/ML FlexPen 4-8 units with every meal 07/10/23   Carlus Pavlov, MD  insulin glargine (LANTUS SOLOSTAR) 100 UNIT/ML Solostar Pen Inject 16 Units into the skin at bedtime. 11/08/23   Carlus Pavlov, MD  Insulin Pen Needle 32G X 4 MM MISC Use  4x a day 09/27/23   Carlus Pavlov, MD  latanoprost (XALATAN) 0.005 % ophthalmic solution Place 1 drop into both eyes at bedtime. 04/16/22   [provider]  lipase/protease/amylase 24000-76000 units CPEP Take 1 capsule (24,000 Units total) by mouth 3 (three) times daily before meals. Patient taking differently: Take 24,000-48,000 Units by mouth See admin instructions. Take 2 tablet before meals and 1 tablet after meals. 01/31/22   Burnadette Pop, MD  Menthol-Methyl Salicylate (SALONPAS PAIN RELIEF PATCH) PTCH Apply 1 patch topically daily as needed (pain).    [provider]    Physical Exam: Vitals:   12/07/23 2009 12/07/23 2126 12/07/23 2130 12/07/23 2135  BP: (!) 84/60 (!) 135/117 137/73   Pulse: (!) 104 85 91   Resp: 20 (!) 26 16   Temp: 97.7 F (36.5 C)     SpO2: 94% 98% 100% 98%  Weight:        Constitutional: NAD, no diaphoresis  Eyes: PERTLA, lids and conjunctivae normal ENMT: Mucous membranes are dry. Posterior pharynx clear of any exudate or lesions.   Neck: supple, no masses  Respiratory: no wheezing, no crackles. No accessory muscle use.  Cardiovascular: S1 & S2 heard, regular rate and rhythm. No JVD. Abdomen: No distension, no tenderness, soft. Bowel sounds active.  Musculoskeletal: no clubbing / cyanosis. No joint deformity upper and lower extremities.   Skin: right groin incision has scant purulent drainage. Warm, dry,  well-perfused. Neurologic: CN 2-12 grossly intact. Moving all extremities. Alert and oriented.  Psychiatric: Calm. Cooperative.    Labs and Imaging on Admission: I have personally reviewed following labs and imaging studies  CBC: Recent Labs  Lab 12/07/23 2024 12/07/23 2030  WBC 24.9*  --   NEUTROABS 20.9*  --   HGB 13.0 13.3  HCT 39.4 39.0  MCV 94.7  --   PLT 281  --    Basic Metabolic Panel: Recent Labs  Lab 12/07/23 2024 12/07/23 2030  NA 136 135  K 3.7 3.7  CL 100 99  CO2 21*  --   GLUCOSE 232* 220*  BUN 10 9  CREATININE 1.15* 0.90  CALCIUM 8.0*  --    GFR: Estimated Creatinine Clearance: 40.7 mL/min (by C-G formula based on SCr of 0.9 mg/dL). Liver Function Tests: Recent Labs  Lab 12/07/23 2024  AST 84*  ALT 37  ALKPHOS 71  BILITOT 0.6  PROT 5.7*  ALBUMIN 2.4*   No results for input(s): "LIPASE", "AMYLASE" in the last 168 hours. No results for input(s): "AMMONIA" in the last 168 hours. Coagulation Profile: Recent Labs  Lab 12/07/23 2024  INR 1.3*   Cardiac Enzymes: No results for input(s): "CKTOTAL", "CKMB", "CKMBINDEX", "TROPONINI" in the last 168 hours. BNP (last 3 results) No results for input(s): "PROBNP" in the last 8760 hours. HbA1C: No results for input(s): "HGBA1C" in the last 72 hours. CBG: No results for input(s): "GLUCAP" in the last 168 hours. Lipid Profile: No results for input(s): "CHOL", "HDL", "LDLCALC", "TRIG", "CHOLHDL", "LDLDIRECT" in the last 72 hours. Thyroid Function Tests: No results for input(s): "TSH", "T4TOTAL", "FREET4", "T3FREE", "THYROIDAB" in the last 72 hours. Anemia Panel: No results for input(s): "VITAMINB12", "FOLATE", "FERRITIN", "TIBC", "IRON", "RETICCTPCT" in the last 72 hours. Urine analysis:    Component Value Date/Time   COLORURINE YELLOW 10/23/2023 1616   APPEARANCEUR HAZY (A) 10/23/2023 1616   LABSPEC 1.017 10/23/2023 1616   PHURINE 5.0 10/23/2023 1616   GLUCOSEU NEGATIVE 10/23/2023 1616   HGBUR  NEGATIVE 10/23/2023 1616  BILIRUBINUR NEGATIVE 10/23/2023 1616   KETONESUR NEGATIVE 10/23/2023 1616   PROTEINUR NEGATIVE 10/23/2023 1616   UROBILINOGEN 0.2 09/29/2013 1359   NITRITE NEGATIVE 10/23/2023 1616   LEUKOCYTESUR LARGE (A) 10/23/2023 1616   Sepsis Labs: @LABRCNTIP (procalcitonin:4,lacticidven:4) )No results found for this or any previous visit (from the past 240 hours).   Radiological Exams on Admission: CT Angio Chest/Abd/Pel for Dissection W and/or Wo Contrast Result Date: 12/07/2023 CLINICAL DATA:  Acute aortic syndrome, peripheral vascular disease status post vascular bypass grafting, incisional drainage EXAM: CT ANGIOGRAPHY CHEST, ABDOMEN AND PELVIS TECHNIQUE: Non-contrast CT of the chest was initially obtained. Multidetector CT imaging through the chest, abdomen and pelvis was performed using the standard protocol during bolus administration of intravenous contrast. Multiplanar reconstructed images and MIPs were obtained and reviewed to evaluate the vascular anatomy. RADIATION DOSE REDUCTION: This exam was performed according to the departmental dose-optimization program which includes automated exposure control, adjustment of the mA and/or kV according to patient size and/or use of iterative reconstruction technique. CONTRAST:  80mL OMNIPAQUE IOHEXOL 350 MG/ML SOLN COMPARISON:  None Available. FINDINGS: CTA CHEST FINDINGS Cardiovascular: Extensive multi-vessel coronary artery calcification. Global cardiac size within normal limits. No pericardial effusion. Central pulmonary arteries are of normal caliber. Mild atherosclerotic calcification within the thoracic aorta. No aortic aneurysm. Right axillofemoral bypass grafting has been performed with wide patency of the bypass graft. Mild perigraft infiltration and trace fluid is nonspecific and may be postsurgical in nature. The distal anastomosis of the graft in folds a patent right femoral-distal bypass graft which is incompletely  visualized on this examination. The visualized portion the graft is widely patent as is the distal anastomosis. There focal dissection at the femoral anastomosis, best seen on sagittal image # 49/11, above the level of the profundus femoral artery origin. The visualized profundus femoral artery is widely patent. Mediastinum/Nodes: No enlarged mediastinal, hilar, or axillary lymph nodes. Thyroid gland, trachea, and esophagus demonstrate no significant findings. Lungs/Pleura: Minimal biapical scarring. Lungs are otherwise clear. No pneumothorax or pleural effusion. No central obstructing lesion. Musculoskeletal: No chest wall abnormality. No acute or significant osseous findings. Review of the MIP images confirms the above findings. CTA ABDOMEN AND PELVIS FINDINGS VASCULAR Aorta: Moderate atherosclerotic calcification. No aneurysm or dissection. No periaortic infiltration or fluid collections identified. 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 Distally widely patent without aneurysm or dissection. SMA: No hemodynamically significant stenosis. No dissection or aneurysm. Renals: Less than 50% stenosis of the right renal artery proximally. Wide patency of the left renal artery. Normal vascular morphology. No aneurysm or dissection. IMA: Patent without evidence of aneurysm, dissection, vasculitis or significant stenosis. Inflow: Extensive mixed atherosclerotic plaque with chronic occlusion of the right external iliac artery. Internal iliac arteries are diseased but patent at their origins. Left lower extremity arterial inflow is diseased but without evidence of focal hemodynamically significant stenosis. Surgical changes of left femoral distal bypass grafting with wide patency of the bypass graft is identified. Veins: No obvious venous abnormality within the limitations of this arterial phase study. Review of the MIP images confirms the above findings. NON-VASCULAR Hepatobiliary: No focal liver  abnormality is seen. Status post cholecystectomy. No biliary dilatation. 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 Normal enhancement the residual body and tail the pancreas. No pancreatic ductal dilation. No peripancreatic fluid collections. Spleen: Unremarkable Adrenals/Urinary Tract: Adrenal glands are unremarkable. Kidneys are normal, without renal calculi, focal lesion, or hydronephrosis. Bladder is unremarkable. Stomach/Bowel: Stomach is within normal limits. Appendix is not  visualized and is likely absent. No evidence of bowel wall thickening, distention, or inflammatory changes. Lymphatic: There is shotty right inguinal adenopathy, nonspecific, possibly reactive in nature. Reproductive: Uterus and bilateral adnexa are unremarkable. Other: Asymmetric subcutaneous edema within the right chest abdominal wall may be postsurgical in nature. Musculoskeletal: Osseous structures are age-appropriate. No acute bone abnormality. No lytic or blastic bone lesion. Review of the MIP images confirms the above findings. IMPRESSION: 1. Surgical changes of right axillofemoral bypass grafting with wide patency of the bypass graft. Mild perigraft infiltration and trace fluid is nonspecific and may be postsurgical in nature. 2. Incomplete visualization of the right femoral-distal bypass graft. The visualized portion of the graft is widely patent. Focal dissection at the femoral anastomosis, above the level of the profundus femoral artery origin. The visualized profundus femoral artery is widely patent. 3. Surgical changes of left femoral distal bypass grafting with wide patency of the visualize bypass graft. 4. Extensive multi-vessel coronary artery calcification. 5. 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 This is of questionable clinical significance given patency of the additional mesenteric vasculature. 6. 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 Aortic  Atherosclerosis (ICD10-I70.0). Electronically Signed   By: Helyn Numbers M.D.   On: 12/07/2023 23:07   DG Chest 2 View Result Date: 12/07/2023 CLINICAL DATA:  Recent vascular surgery, drainage from incision, concern for sepsis EXAM: CHEST - 2 VIEW COMPARISON:  09/01/2019 FINDINGS: Frontal and lateral views of the chest demonstrate an unremarkable cardiac silhouette. No acute airspace disease, effusion, or pneumothorax. No acute bony abnormalities. IMPRESSION: 1. No acute intrathoracic process. Electronically Signed   By: Sharlet Salina M.D.   On: 12/07/2023 20:39    Assessment/Plan   1. Sepsis secondary to right axillary-femoral graft infection  - Blood cultures collected in ED and empiric antibiotics started  - Continue antibiotics, supportive care, NPO, trend lactate, follow cultures and clinical course    2. PAD  - Lipitor, ASA, Plavix     3. HTN  - Hold antihypertensives given low BP in ED   4. Type II DM  - A1c was 9.5% in November 2024  - Check CBGs and use low-intensity SSI only while NPO   5. Depression, anxiety, insomnia - Cymbalta, Restoril    DVT prophylaxis: SCDs  Code Status: Full, discussed with patient in ED   Level of Care: Level of care: Progressive Family Communication: None present   Disposition Plan:  Patient is from: home  Anticipated d/c is to: TBD Anticipated d/c date is: 12/12/23  Patient currently: Pending likely operative management by vascular surgery  Consults called: Vascular surgery  Admission status: Inpatient     Briscoe Deutscher, MD [facility]  12/07/2023, 11:47 PM

## 2023-12-07 NOTE — Progress Notes (Deleted)
POST OPERATIVE OFFICE NOTE    CC:  F/u for surgery  HPI:  This is a 78 y.o. female who is s/p Right axillary-femoral bypass on 11/15/23 by Dr. Lenell Antu.  This was performed secondary to thrombosis of right external iliac / common femoral stent causing disabling claudication. She overall did well post operatively. She did have a little SS drainage at time of discharge but it was improving. She was discharged home on POD#5.   Pt returns today as triage work in with concerns of drainage from right groin. Pt states ***   Allergies  Allergen Reactions   Wound Dressing Adhesive Rash and Other (See Comments)    Pt states that tape and electrodes leave red "scars" on her skin and her skin is very sensitive.  Paper tape ok to use    Current Outpatient Medications  Medication Sig Dispense Refill   amLODipine (NORVASC) 5 MG tablet Take 5 mg by mouth daily.     aspirin EC 81 MG tablet Take 81 mg by mouth at bedtime.     atorvastatin (LIPITOR) 80 MG tablet Take 1 tablet (80 mg total) by mouth daily. 30 tablet 11   clopidogrel (PLAVIX) 75 MG tablet Take 1 tablet (75 mg total) by mouth daily with breakfast. 30 tablet 3   Continuous Glucose Receiver (FREESTYLE LIBRE 3 READER) DEVI Use to monitor glucose continuously. 1 each 0   Continuous Glucose Sensor (FREESTYLE LIBRE 3 PLUS SENSOR) MISC Inject 1 Device into the skin continuous. Change every 15 days 6 each 3   diclofenac Sodium (VOLTAREN) 1 % GEL Apply 1 application. topically daily as needed (leg pain).     diphenhydramine-acetaminophen (TYLENOL PM) 25-500 MG TABS tablet Take 1 tablet by mouth at bedtime as needed (sleep).     DULoxetine (CYMBALTA) 60 MG capsule Take 60 mg by mouth daily.     ferrous sulfate 325 (65 FE) MG EC tablet Take 325 mg by mouth daily.     gabapentin (NEURONTIN) 600 MG tablet Take 600 mg by mouth 3 (three) times daily.     insulin aspart (NOVOLOG FLEXPEN) 100 UNIT/ML FlexPen 4-8 units with every meal 15 mL 11   insulin  glargine (LANTUS SOLOSTAR) 100 UNIT/ML Solostar Pen Inject 16 Units into the skin at bedtime.     Insulin Pen Needle 32G X 4 MM MISC Use 4x a day 300 each 3   latanoprost (XALATAN) 0.005 % ophthalmic solution Place 1 drop into both eyes at bedtime.     lipase/protease/amylase 24000-76000 units CPEP Take 1 capsule (24,000 Units total) by mouth 3 (three) times daily before meals. (Patient taking differently: Take 24,000-48,000 Units by mouth See admin instructions. Take 2 tablet before meals and 1 tablet after meals.) 270 capsule 0   losartan (COZAAR) 50 MG tablet Take 50 mg by mouth daily.     Magnesium 250 MG TABS Take 250 mg by mouth daily.     Menthol-Methyl Salicylate (SALONPAS PAIN RELIEF PATCH) PTCH Apply 1 patch topically daily as needed (pain).     metFORMIN (GLUCOPHAGE-XR) 500 MG 24 hr tablet Take 1 tablet (500 mg total) by mouth 2 (two) times daily with a meal. 180 tablet 2   metoprolol succinate (TOPROL-XL) 100 MG 24 hr tablet Take 100 mg by mouth daily. Take with or immediately following a meal.     Multiple Vitamins-Minerals (CENTRUM SILVER 50+WOMEN) TABS Take 1 tablet by mouth daily.     OVER THE COUNTER MEDICATION Take 1 tablet by mouth daily.  Brain health supplement     oxybutynin (DITROPAN-XL) 10 MG 24 hr tablet Take 10 mg by mouth daily.     pantoprazole (PROTONIX) 40 MG tablet Take 40 mg by mouth 2 (two) times daily.     polyethylene glycol (MIRALAX / GLYCOLAX) 17 g packet Take 17 g by mouth daily as needed. 14 each 0   temazepam (RESTORIL) 30 MG capsule Take 30 mg by mouth at bedtime as needed for sleep.  0   timolol (TIMOPTIC) 0.5 % ophthalmic solution Place 1 drop into both eyes every morning.     traMADol (ULTRAM) 50 MG tablet Take 1 tablet (50 mg total) by mouth every 6 (six) hours as needed for severe pain (pain score 7-10). 30 tablet 0   traZODone (DESYREL) 50 MG tablet Take 75 mg by mouth at bedtime as needed for sleep.     No current facility-administered medications for  this visit.     ROS:  See HPI  Physical Exam:  ***  Incision:  *** Extremities:  *** Neuro: *** Abdomen:  ***  Non invasive Vascular lab: ***   Assessment/Plan:  This is a 78 y.o. female who is s/p: ***  -***   Nathanial Rancher, [facility] [phone]   Clinic MD:  Hetty Blend

## 2023-12-07 NOTE — ED Provider Triage Note (Signed)
Emergency Medicine Provider Triage Evaluation Note  Jill Higgins , a 78 y.o. female  was evaluated in triage.  Pt complains of post-op problem.  Review of Systems  Positive: Incision site pain, redness, drainage  Negative: Chest pain, shortness of breath   Physical Exam  BP (!) 84/60 (BP Location: Right Arm)   Pulse (!) 104   Temp 97.7 F (36.5 C)   Resp 20   Wt 50 kg   SpO2 94%   BMI 19.53 kg/m  Gen:   Awake, no distress   Resp:  Normal effort  MSK:   Moves extremities without difficulty  Other:  Pulses bilaterally   Medical Decision Making  Medically screening exam initiated at 8:25 PM.  Appropriate orders placed.  Jill Higgins was informed that the remainder of the evaluation will be completed by another provider, this initial triage assessment does not replace that evaluation, and the importance of remaining in the ED until their evaluation is complete.  Has had 2 days of drainage, 4 weeks post femoral bypass surgery.  Patient is hypotensive and tachycardic.  Has small area of cellulitis near incision that is draining purulent nonbloody.  Patient reports feeling generally unwell.  Suspect this is sepsis from incision site.  Lactic and blood cultures ordered.  Fluid bolus ordered.  Starting IV and getting patient to room.   Jill Higgins, [place] 12/07/23 2054

## 2023-12-07 NOTE — Consult Note (Signed)
Hospital Consult    Reason for Consult: Right groin purulent drainage after axillofemoral bypass Referring Physician: ED MRN #:  [id number]  History of Present Illness: This is a 78 y.o. female with history of diabetes, hyperlipidemia, hypertension that vascular surgery has been consulted for purulent drainage from the right groin after ax femoral bypass.  Patient underwent right axillary to femoral bypass on 11/15/2023 by Dr. Lenell Antu for disabling claudication.  States she noticed purulent drainage from her groin over the last several days.  Now code sepsis protocol in the ED.  White count 24,000.  In the past patient has a history of right common femoral endarterectomy with right femoral to above-knee popliteal bypass with vein by Dr. Darrick Penna.  More recently on 04/20/2023 patient underwent stenting of the right iliac and common femoral arteries by Dr. Lenell Antu.  The stents occluded and she underwent the right axillary femoral bypass on 11/15/2023 with 6 mm ringed PTFE.  Past Medical History:  Diagnosis Date   Bronchitis    Bulging disc    in neck   Depression with anxiety    Diabetes mellitus    Type II   Fibromyalgia    pt. denies   GERD (gastroesophageal reflux disease)    Glaucoma    History of bronchitis    Hyperlipidemia    Hypertension    Overactive bladder    Peripheral vascular disease (HCC)    Pneumonia    Sleep apnea    recent test negative for sleep apnea   Tobacco abuse    Urinary frequency     Past Surgical History:  Procedure Laterality Date   ABDOMINAL AORTAGRAM N/A 08/15/2013   Procedure: ABDOMINAL Ronny Flurry;  Surgeon: Sherren Kerns, MD;  Location: [facility] CATH LAB;  Service: Cardiovascular;  Laterality: N/A;   ABDOMINAL AORTAGRAM N/A 06/19/2014   Procedure: ABDOMINAL Ronny Flurry;  Surgeon: Sherren Kerns, MD;  Location: [facility] CATH LAB;  Service: Cardiovascular;  Laterality: N/A;   ABDOMINAL AORTOGRAM W/LOWER EXTREMITY N/A 05/10/2018   Procedure: ABDOMINAL AORTOGRAM  W/LOWER EXTREMITY;  Surgeon: Sherren Kerns, MD;  Location: [facility] INVASIVE CV LAB;  Service: Cardiovascular;  Laterality: N/A;  bilateral   ABDOMINAL AORTOGRAM W/LOWER EXTREMITY N/A 04/20/2023   Procedure: ABDOMINAL AORTOGRAM W/LOWER EXTREMITY;  Surgeon: Leonie Douglas, MD;  Location: [facility] INVASIVE CV LAB;  Service: Cardiovascular;  Laterality: N/A;   Aortogram w/ PTA  05/14/08,  11-04-10   Bilateral aortogram w/ bilateral  SFA PTA  stenting    AXILLARY-FEMORAL BYPASS GRAFT Right 11/15/2023   Procedure: RIGHT AXILLO-FEMORAL BYPASS GRAFT;  Surgeon: Leonie Douglas, MD;  Location: [facility] OR;  Service: Vascular;  Laterality: Right;   BREAST SURGERY Right    boil removal   BUNIONECTOMY     L foot in the 1980s   COLONOSCOPY     2014   DILATION AND CURETTAGE OF UTERUS     X4   ENDARTERECTOMY FEMORAL Right 06/03/2018   Procedure: RIGHT FEMORAL ENDARTERECTOMY;  Surgeon: Sherren Kerns, MD;  Location: [facility] OR;  Service: Vascular;  Laterality: Right;   Epidural shots in neck      ESOPHAGOGASTRODUODENOSCOPY (EGD) WITH PROPOFOL N/A 09/15/2019   Procedure: ESOPHAGOGASTRODUODENOSCOPY (EGD) WITH PROPOFOL;  Surgeon: Willis Modena, MD;  Location: [facility] ENDOSCOPY;  Service: Endoscopy;  Laterality: N/A;   EUS N/A 09/15/2019   Procedure: UPPER ENDOSCOPIC ULTRASOUND (EUS) LINEAR;  Surgeon: Willis Modena, MD;  Location: [facility] ENDOSCOPY;  Service: Endoscopy;  Laterality: N/A;   EYE SURGERY Bilateral 2016   cataract  removal   FEMORAL-POPLITEAL BYPASS GRAFT Left 10/21/2013   Procedure: LEFT FEMORAL-POPLITEAL ARTERY BYPASS WITH SAPHENOUS VEIN GRAFT , POPLITEAL ENDARTERECTOMY ,INTRAOPERATIVE ARTERIOGRAM, vein patch angioplasty to popliteal artery;  Surgeon: Sherren Kerns, MD;  Location: [facility] OR;  Service: Vascular;  Laterality: Left;   FEMORAL-POPLITEAL BYPASS GRAFT Right 02/08/2016   Procedure: Right FEMORAL- to Above Knee POPLITEAL ARTERY Bypass Graft with reversed saphenous vein and Common Femoral Endarterectomy  with  profundoplasty;  Surgeon: Sherren Kerns, MD;  Location: [facility] OR;  Service: Vascular;  Laterality: Right;   FINE NEEDLE ASPIRATION N/A 09/15/2019   Procedure: FINE NEEDLE ASPIRATION (FNA) LINEAR;  Surgeon: Willis Modena, MD;  Location: [facility] ENDOSCOPY;  Service: Endoscopy;  Laterality: N/A;   GROIN DEBRIDEMENT Left 10/28/2013   Procedure: left inner thigh DEBRIDEMENT;  Surgeon: Sherren Kerns, MD;  Location: [facility] OR;  Service: Vascular;  Laterality: Left;   HAND SURGERY Left 2010   LOWER EXTREMITY ANGIOGRAM Bilateral 12/24/2015   Procedure: Lower Extremity Angiogram;  Surgeon: Sherren Kerns, MD;  Location: [facility] INVASIVE CV LAB;  Service: Cardiovascular;  Laterality: Bilateral;   PATCH ANGIOPLASTY Right 06/03/2018   Procedure: PATCH ANGIOPLASTY USING A XENOSURE 1CM X 14CM BIOLOGIC PATCH;  Surgeon: Sherren Kerns, MD;  Location: [facility] OR;  Service: Vascular;  Laterality: Right;   PERIPHERAL VASCULAR CATHETERIZATION N/A 12/24/2015   Procedure: Abdominal Aortogram;  Surgeon: Sherren Kerns, MD;  Location: [facility] INVASIVE CV LAB;  Service: Cardiovascular;  Laterality: N/A;   PERIPHERAL VASCULAR CATHETERIZATION Left 12/24/2015   Procedure: Peripheral Vascular Balloon Angioplasty;  Surgeon: Sherren Kerns, MD;  Location: [facility] INVASIVE CV LAB;  Service: Cardiovascular;  Laterality: Left;  drug coated balloon   PERIPHERAL VASCULAR CATHETERIZATION N/A 06/30/2016   Procedure: Abdominal Aortogram;  Surgeon: Sherren Kerns, MD;  Location: [facility] INVASIVE CV LAB;  Service: Cardiovascular;  Laterality: N/A;   PERIPHERAL VASCULAR CATHETERIZATION Bilateral 06/30/2016   Procedure: Lower Extremity Angiography;  Surgeon: Sherren Kerns, MD;  Location: [facility] INVASIVE CV LAB;  Service: Cardiovascular;  Laterality: Bilateral;   TONSILLECTOMY      Allergies  Allergen Reactions   Wound Dressing Adhesive Rash and Other (See Comments)    Pt states that tape and electrodes leave red "scars" on her skin and her skin is very sensitive.   Paper tape ok to use    Prior to Admission medications   Medication Sig Start Date End Date Taking? Authorizing Provider  amLODipine (NORVASC) 5 MG tablet Take 5 mg by mouth daily.    [provider]  aspirin EC 81 MG tablet Take 81 mg by mouth at bedtime.    [provider]  atorvastatin (LIPITOR) 80 MG tablet Take 1 tablet (80 mg total) by mouth daily. 04/21/23 04/20/24  Baglia, Corrina, PA-C  clopidogrel (PLAVIX) 75 MG tablet Take 1 tablet (75 mg total) by mouth daily with breakfast. 04/22/23   Baglia, Corrina, PA-C  Continuous Glucose Receiver (FREESTYLE LIBRE 3 READER) DEVI Use to monitor glucose continuously. 09/24/23   Carlus Pavlov, MD  Continuous Glucose Sensor (FREESTYLE LIBRE 3 PLUS SENSOR) MISC Inject 1 Device into the skin continuous. Change every 15 days 09/27/23   Carlus Pavlov, MD  diclofenac Sodium (VOLTAREN) 1 % GEL Apply 1 application. topically daily as needed (leg pain). 01/05/22   [provider]  diphenhydramine-acetaminophen (TYLENOL PM) 25-500 MG TABS tablet Take 1 tablet by mouth at bedtime as needed (sleep).    [provider]  DULoxetine (CYMBALTA) 60 MG capsule Take  60 mg by mouth daily. 04/19/22   [provider]  ferrous sulfate 325 (65 FE) MG EC tablet Take 325 mg by mouth daily.    [provider]  gabapentin (NEURONTIN) 600 MG tablet Take 600 mg by mouth 3 (three) times daily. 07/21/23   [provider]  insulin aspart (NOVOLOG FLEXPEN) 100 UNIT/ML FlexPen 4-8 units with every meal 07/10/23   Carlus Pavlov, MD  insulin glargine (LANTUS SOLOSTAR) 100 UNIT/ML Solostar Pen Inject 16 Units into the skin at bedtime. 11/08/23   Carlus Pavlov, MD  Insulin Pen Needle 32G X 4 MM MISC Use 4x a day 09/27/23   Carlus Pavlov, MD  latanoprost (XALATAN) 0.005 % ophthalmic solution Place 1 drop into both eyes at bedtime. 04/16/22   [provider]  lipase/protease/amylase 24000-76000 units CPEP Take 1  capsule (24,000 Units total) by mouth 3 (three) times daily before meals. Patient taking differently: Take 24,000-48,000 Units by mouth See admin instructions. Take 2 tablet before meals and 1 tablet after meals. 01/31/22   Burnadette Pop, MD  losartan (COZAAR) 50 MG tablet Take 50 mg by mouth daily. 09/18/23   [provider]  Magnesium 250 MG TABS Take 250 mg by mouth daily.    [provider]  Menthol-Methyl Salicylate (SALONPAS PAIN RELIEF PATCH) PTCH Apply 1 patch topically daily as needed (pain).    [provider]  metFORMIN (GLUCOPHAGE-XR) 500 MG 24 hr tablet Take 1 tablet (500 mg total) by mouth 2 (two) times daily with a meal. 07/20/23   Carlus Pavlov, MD  metoprolol succinate (TOPROL-XL) 100 MG 24 hr tablet Take 100 mg by mouth daily. Take with or immediately following a meal.    [provider]  Multiple Vitamins-Minerals (CENTRUM SILVER 50+WOMEN) TABS Take 1 tablet by mouth daily.    [provider]  OVER THE COUNTER MEDICATION Take 1 tablet by mouth daily. Brain health supplement    [provider]  oxybutynin (DITROPAN-XL) 10 MG 24 hr tablet Take 10 mg by mouth daily. 07/25/23   [provider]  pantoprazole (PROTONIX) 40 MG tablet Take 40 mg by mouth 2 (two) times daily. 10/25/15   [provider]  polyethylene glycol (MIRALAX / GLYCOLAX) 17 g packet Take 17 g by mouth daily as needed. 01/31/22   Burnadette Pop, MD  temazepam (RESTORIL) 30 MG capsule Take 30 mg by mouth at bedtime as needed for sleep. 04/15/18   [provider]  timolol (TIMOPTIC) 0.5 % ophthalmic solution Place 1 drop into both eyes every morning. 12/20/21   [provider]  traMADol (ULTRAM) 50 MG tablet Take 1 tablet (50 mg total) by mouth every 6 (six) hours as needed for severe pain (pain score 7-10). 11/20/23   Baglia, Corrina, PA-C  traZODone (DESYREL) 50 MG tablet Take 75 mg by mouth at bedtime as needed for sleep.     [provider]    Social History   Socioeconomic History   Marital status: Single    Spouse name: Not on file   Number of children: 1   Years of education: Not on file   Highest education level: Not on file  Occupational History   Occupation: Retired    Associate Professor: Scientist, water quality  Tobacco Use   Smoking status: Former    Current packs/day: 0.25    Average packs/day: 0.3 packs/day for 0.5 years (0.1 ttl pk-yrs)    Types: Cigarettes    Start date: 06/2023    Quit date: 10/2014  Passive exposure: Never   Smokeless tobacco: Never   Tobacco comments:    pt states that she is using the E cig only   Vaping Use   Vaping status: Some Days  Substance and Sexual Activity   Alcohol use: Yes    Alcohol/week: 0.0 - 1.0 standard drinks of alcohol    Comment: Occasional; 10-22 rarely    Drug use: No   Sexual activity: Not on file  Other Topics Concern   Not on file  Social History Narrative   Drinks about 2 cups of coffee a day, and some tea    Social Drivers of Corporate investment banker Strain: Not on file  Food Insecurity: No Food Insecurity (11/15/2023)   Hunger Vital Sign    Worried About Running Out of Food in the Last Year: Never true    Ran Out of Food in the Last Year: Never true  Transportation Needs: No Transportation Needs (11/15/2023)   PRAPARE - Administrator, Civil Service (Medical): No    Lack of Transportation (Non-Medical): No  Physical Activity: Not on file  Stress: Not on file  Social Connections: Moderately Isolated (11/20/2023)   Social Connection and Isolation Panel [NHANES]    Frequency of Communication with Friends and Family: More than three times a week    Frequency of Social Gatherings with Friends and Family: More than three times a week    Attends Religious Services: 1 to 4 times per year    Active Member of [employer] or Organizations: No    Attends Banker Meetings: Never    Marital Status: Divorced  Catering manager  Violence: Not At Risk (11/15/2023)   Humiliation, Afraid, Rape, and Kick questionnaire    Fear of Current or Ex-Partner: No    Emotionally Abused: No    Physically Abused: No    Sexually Abused: No     Family History  Problem Relation Age of Onset   Heart disease Father    Heart attack Father        MI at age 34   Hypertension Mother    Alzheimer's disease Mother    Hypertension Sister    Diabetes Brother    Hyperlipidemia Brother    Hypertension Brother    Heart disease Brother    Heart attack Brother    Breast cancer Maternal Aunt     ROS: [x]  Positive   [ ]  Negative   [ ]  All sytems reviewed and are negative  Cardiovascular: []  chest pain/pressure []  palpitations []  SOB lying flat []  DOE []  pain in legs while walking []  pain in legs at rest []  pain in legs at night []  non-healing ulcers []  hx of DVT []  swelling in legs  Pulmonary: []  productive cough []  asthma/wheezing []  home O2  Neurologic: []  weakness in []  arms []  legs []  numbness in []  arms []  legs []  hx of CVA []  mini stroke [] difficulty speaking or slurred speech []  temporary loss of vision in one eye []  dizziness  Hematologic: []  hx of cancer []  bleeding problems []  problems with blood clotting easily  Endocrine:   []  diabetes []  thyroid disease  GI []  vomiting blood []  blood in stool  GU: []  CKD/renal failure []  HD--[]  M/W/F or []  T/T/S []  burning with urination []  blood in urine  Psychiatric: []  anxiety []  depression  Musculoskeletal: []  arthritis []  joint pain  Integumentary: []  rashes []  ulcers  Constitutional: []  fever []  chills   Physical Examination  Vitals:   12/07/23 2130 12/07/23 2135  BP: 137/73   Pulse: 91   Resp: 16   Temp:    SpO2: 100% 98%   Body mass index is 19.53 kg/m.  General:  NAD Gait: Not observed HENT: WNL, normocephalic Pulmonary: normal non-labored breathing Cardiac: regular, without  Murmurs, rubs or gallops Abdomen:  soft,  NT/ND, no masses Vascular Exam/Pulses: Right axillary incision on the chest wall healed Right groin incision with purulent drainage No palpable right pedal pulses Left DP palpable Musculoskeletal: no muscle wasting or atrophy  Neurologic: A&O X 3; Appropriate Affect ; SENSATION: normal; MOTOR FUNCTION:  moving all extremities equally. Speech is fluent/normal   CBC    Component Value Date/Time   WBC 24.9 (H) 12/07/2023 2024   RBC 4.16 12/07/2023 2024   HGB 13.3 12/07/2023 2030   HCT 39.0 12/07/2023 2030   PLT 281 12/07/2023 2024   MCV 94.7 12/07/2023 2024   MCH 31.3 12/07/2023 2024   MCHC 33.0 12/07/2023 2024   RDW 14.1 12/07/2023 2024   LYMPHSABS 1.6 12/07/2023 2024   MONOABS 0.8 12/07/2023 2024   EOSABS 0.0 12/07/2023 2024   BASOSABS 0.1 12/07/2023 2024    BMET    Component Value Date/Time   NA 135 12/07/2023 2030   K 3.7 12/07/2023 2030   CL 99 12/07/2023 2030   CO2 21 (L) 12/07/2023 2024   GLUCOSE 220 (H) 12/07/2023 2030   BUN 9 12/07/2023 2030   CREATININE 0.90 12/07/2023 2030   CALCIUM 8.0 (L) 12/07/2023 2024   GFRNONAA 49 (L) 12/07/2023 2024   GFRAA >60 09/04/2019 0249    COAGS: Lab Results  Component Value Date   INR 1.3 (H) 12/07/2023   INR 0.9 10/23/2023   INR 0.9 04/20/2023     Non-Invasive Vascular Imaging:    CTA pending   ASSESSMENT/PLAN: This is a 78 y.o. female with history of diabetes, hyperlipidemia, hypertension that vascular surgery been consulted for purulent drainage from the right groin after ax femoral bypass.  Patient underwent right axillary to femoral bypass on 11/15/2023 by Dr. Lenell Antu for disabling claudication in the setting of occluded right external and common femoral stents.  Agree that I suspect her bypass is infected.  Needs CTA chest abdomen and pelvis.  Agree with code sepsis protocol and blood cultures.  Broad-spectrum antibiotics and appreciate medicine for admission.  We will coordinate surgical plan pending CTA and  further discussion with the patient.  Please keep n.p.o. tonight.  Cephus Shelling, MD [facility] [place] Office: [phone]  Cephus Shelling

## 2023-12-07 NOTE — Progress Notes (Signed)
Pharmacy Antibiotic Note  Jill Higgins is a 78 y.o. female admitted on 12/07/2023 with right groin purulent drainage s/p axillofemoral bypass.  Pharmacy has been consulted for vancomycin dosing. PMH includes: right axillary to femoral bypass on 11/15/2023 given previous stents occluded.   -WBC 24.9, sCr 0.9, lactate 5.7, afebrile -Vanc/cefepime x1 in ED -Blood cultures collected  Plan: -Cefepime 2g IV every 12 hours -Vancomycin 1g IV x1 in ED -Vancomycin 750mg  IV every 24 hours (AUC 546, Vd 0.72, TBW) -Monitor renal function -Follow up signs of clinical improvement, LOT, de-escalation of antibiotics   Weight: 50 kg (110 lb 3.7 oz)  Temp (24hrs), Avg:97.7 F (36.5 C), Min:97.7 F (36.5 C), Max:97.7 F (36.5 C)  Recent Labs  Lab 12/07/23 2024 12/07/23 2030 12/07/23 2036  WBC 24.9*  --   --   CREATININE 1.15* 0.90  --   LATICACIDVEN  --   --  5.7*    Estimated Creatinine Clearance: 40.7 mL/min (by C-G formula based on SCr of 0.9 mg/dL).    Allergies  Allergen Reactions   Wound Dressing Adhesive Rash and Other (See Comments)    Pt states that tape and electrodes leave red "scars" on her skin and her skin is very sensitive.  Paper tape ok to use    Antimicrobials this admission: Cefepime 1/17 >>  Vancomycin 1/17 >>   Microbiology results: 1/17 BCx:   Thank you for allowing pharmacy to be a part of this patient's care.  Arabella Merles, PharmD. Clinical Pharmacist 12/07/2023 11:57 PM

## 2023-12-08 ENCOUNTER — Inpatient Hospital Stay (HOSPITAL_COMMUNITY): Payer: Medicare Other

## 2023-12-08 DIAGNOSIS — I10 Essential (primary) hypertension: Secondary | ICD-10-CM | POA: Diagnosis not present

## 2023-12-08 DIAGNOSIS — A419 Sepsis, unspecified organism: Secondary | ICD-10-CM | POA: Diagnosis not present

## 2023-12-08 DIAGNOSIS — B9562 Methicillin resistant Staphylococcus aureus infection as the cause of diseases classified elsewhere: Secondary | ICD-10-CM | POA: Diagnosis not present

## 2023-12-08 DIAGNOSIS — Z01810 Encounter for preprocedural cardiovascular examination: Secondary | ICD-10-CM

## 2023-12-08 DIAGNOSIS — R6520 Severe sepsis without septic shock: Secondary | ICD-10-CM

## 2023-12-08 DIAGNOSIS — T827XXA Infection and inflammatory reaction due to other cardiac and vascular devices, implants and grafts, initial encounter: Secondary | ICD-10-CM | POA: Diagnosis not present

## 2023-12-08 DIAGNOSIS — F418 Other specified anxiety disorders: Secondary | ICD-10-CM | POA: Diagnosis not present

## 2023-12-08 DIAGNOSIS — E1159 Type 2 diabetes mellitus with other circulatory complications: Secondary | ICD-10-CM

## 2023-12-08 DIAGNOSIS — R7881 Bacteremia: Secondary | ICD-10-CM

## 2023-12-08 LAB — CBC
HCT: 29.7 % — ABNORMAL LOW (ref 36.0–46.0)
Hemoglobin: 10 g/dL — ABNORMAL LOW (ref 12.0–15.0)
MCH: 32.1 pg (ref 26.0–34.0)
MCHC: 33.7 g/dL (ref 30.0–36.0)
MCV: 95.2 fL (ref 80.0–100.0)
Platelets: 177 10*3/uL (ref 150–400)
RBC: 3.12 MIL/uL — ABNORMAL LOW (ref 3.87–5.11)
RDW: 14.1 % (ref 11.5–15.5)
WBC: 17.4 10*3/uL — ABNORMAL HIGH (ref 4.0–10.5)
nRBC: 0 % (ref 0.0–0.2)

## 2023-12-08 LAB — COMPREHENSIVE METABOLIC PANEL
ALT: 25 U/L (ref 0–44)
AST: 46 U/L — ABNORMAL HIGH (ref 15–41)
Albumin: 1.7 g/dL — ABNORMAL LOW (ref 3.5–5.0)
Alkaline Phosphatase: 52 U/L (ref 38–126)
Anion gap: 7 (ref 5–15)
BUN: 11 mg/dL (ref 8–23)
CO2: 22 mmol/L (ref 22–32)
Calcium: 7.1 mg/dL — ABNORMAL LOW (ref 8.9–10.3)
Chloride: 107 mmol/L (ref 98–111)
Creatinine, Ser: 0.93 mg/dL (ref 0.44–1.00)
GFR, Estimated: >60 mL/min (ref >60)
Glucose, Bld: 118 mg/dL — ABNORMAL HIGH (ref 70–99)
Potassium: 3.2 mmol/L — ABNORMAL LOW (ref 3.5–5.1)
Sodium: 136 mmol/L (ref 135–145)
Total Bilirubin: 0.6 mg/dL (ref 0.0–1.2)
Total Protein: 4.2 g/dL — ABNORMAL LOW (ref 6.5–8.1)

## 2023-12-08 LAB — BLOOD CULTURE ID PANEL (REFLEXED) - BCID2

## 2023-12-08 LAB — CBG MONITORING, ED
Glucose-Capillary: 109 mg/dL — ABNORMAL HIGH (ref 70–99)
Glucose-Capillary: 122 mg/dL — ABNORMAL HIGH (ref 70–99)
Glucose-Capillary: 101 mg/dL — ABNORMAL HIGH (ref 70–99)
Glucose-Capillary: 98 mg/dL (ref 70–99)

## 2023-12-08 LAB — GLUCOSE, CAPILLARY
Glucose-Capillary: 129 mg/dL — ABNORMAL HIGH (ref 70–99)
Glucose-Capillary: 90 mg/dL (ref 70–99)
Glucose-Capillary: 95 mg/dL (ref 70–99)

## 2023-12-08 LAB — I-STAT CG4 LACTIC ACID, ED: Lactic Acid, Venous: 4.2 mmol/L (ref 0.5–1.9)

## 2023-12-08 LAB — MAGNESIUM: Magnesium: 1 mg/dL — ABNORMAL LOW (ref 1.7–2.4)

## 2023-12-08 LAB — SURGICAL PCR SCREEN
MRSA, PCR: POSITIVE — AB
Staphylococcus aureus: POSITIVE — AB

## 2023-12-08 LAB — CK: Total CK: 270 U/L — ABNORMAL HIGH (ref 38–234)

## 2023-12-08 LAB — LACTIC ACID, PLASMA: Lactic Acid, Venous: 1.6 mmol/L (ref 0.5–1.9)

## 2023-12-08 MED ORDER — SODIUM CHLORIDE 0.9 % IV SOLN: INTRAVENOUS | Status: AC

## 2023-12-08 MED ORDER — MUPIROCIN 2 % EX OINT
1.0000 | TOPICAL_OINTMENT | Freq: Two times a day (BID) | CUTANEOUS | Status: AC
  Administered 2023-12-09 – 2023-12-13 (×9): 1 via NASAL
  Filled 2023-12-08: qty 22

## 2023-12-08 MED ORDER — CHLORHEXIDINE GLUCONATE CLOTH 2 % EX PADS
6.0000 | MEDICATED_PAD | Freq: Every day | CUTANEOUS | Status: AC
  Administered 2023-12-09 – 2023-12-13 (×5): 6 via TOPICAL

## 2023-12-08 NOTE — Progress Notes (Signed)
Patient received from ED, V/S taken, CCMD notified, CHG bath given, pain management done as per her pain level, call bell in reach. B/L DP were present with doppler.  12/08/23 1612  Vitals  Temp 98.4 F (36.9 C)  Temp Source Oral  BP (!) 129/93  MAP (mmHg) 105  BP Location Right Arm  BP Method Automatic  Patient Position (if appropriate) Lying  Pulse Rate 89  Pulse Rate Source Monitor  ECG Heart Rate 92  Resp 20  Level of Consciousness  Level of Consciousness Alert  MEWS COLOR  MEWS Score Color Green  Oxygen Therapy  SpO2 100 %  O2 Device Room Air  Pain Assessment  Pain Scale 0-10  Pain Score 9  Pain Type Acute pain  Pain Location Groin  Pain Orientation Right  Pain Descriptors / Indicators Aching;Burning  Pain Frequency Constant  Pain Onset On-going  Patients Stated Pain Goal 0  Pain Intervention(s) Medication (See eMAR)  MEWS Score  MEWS Temp 0  MEWS Systolic 0  MEWS Pulse 0  MEWS RR 0  MEWS LOC 0  MEWS Score 0

## 2023-12-08 NOTE — ED Notes (Signed)
Patient returned from CT, CT states right AC IV not working and removed. Upon assessment, left AC IV not in place.

## 2023-12-08 NOTE — ED Notes (Signed)
Patient to vascular at this time.

## 2023-12-08 NOTE — Progress Notes (Signed)
[facility]  Subjective  -states she is cold in the ED.   Objective 108/62 89 98.6 F (37 C) (Oral) 20 98%  Intake/Output Summary (Last 24 hours) at 12/08/2023 1550 Last data filed at 12/08/2023 1024 Gross per 24 hour  Intake 3109.61 ml  Output --  Net 3109.61 ml    Purulent drainage from right groin Bilateral DP pulses palpable  Laboratory Lab Results: Recent Labs    12/07/23 2024 12/07/23 2030 12/08/23 0529  WBC 24.9*  --  17.4*  HGB 13.0 13.3 10.0*  HCT 39.4 39.0 29.7*  PLT 281  --  177   BMET Recent Labs    12/07/23 2024 12/07/23 2030 12/08/23 0529  NA 136 135 136  K 3.7 3.7 3.2*  CL 100 99 107  CO2 21*  --  22  GLUCOSE 232* 220* 118*  BUN 10 9 11   CREATININE 1.15* 0.90 0.93  CALCIUM 8.0*  --  7.1*    COAG Lab Results  Component Value Date   INR 1.3 (H) 12/07/2023   INR 0.9 10/23/2023   INR 0.9 04/20/2023   No results found for: "PTT"  Assessment/Planning:  78 year old female underwent right axillofemoral with Dr. Lenell Antu for claudication with occluded right external iliac and common femoral stents.  Her bypass is infected and she has purulent drainage from the groin.  Continue broad-spectrum antibiotics.  Tentative plan is OR tomorrow morning for excision of the bypass graft and likely vein patch and possible muscle flap.  Discussed she will be back to claudication with potential risk for worsening ischemia.  Wound healing will be an issue as she has had multiple right groin procedures.  N.p.o. at midnight.  Consent order placed.  Cephus Shelling 12/08/2023 3:50 PM --

## 2023-12-08 NOTE — Progress Notes (Signed)
PHARMACY - PHYSICIAN COMMUNICATION CRITICAL VALUE ALERT - BLOOD CULTURE IDENTIFICATION (BCID)  Jill Higgins is an 78 y.o. female who presented to [facility] on 12/07/2023 with a chief complaint of incision drainage s/p fem pop bypass surgery 12/26   Assessment:  Patient on cefepime and vancomycin secondary to lactic acidosis 2/2 c/f graft infection. Blood cultures w/ 1/4 gpc, BCID MRSA.  ID has been consulted by primary team. Per MD Rutha Bouchard. Recommendation to continue with vancomycin monotherapy.  Name of physician (or Provider) ContactedCaleb Popp / Vu  Current antibiotics: cefepime/vanco  Changes to prescribed antibiotics recommended:  - D/c Cefepime -Vancomycin 750mg  IV every 24 hours (AUC 546, Vd 0.72, TBW) -Monitor renal function  Results for orders placed or performed during the hospital encounter of 12/07/23  Blood Culture ID Panel (Reflexed) (Collected: 12/07/2023  8:17 PM)  Result Value Ref Range   Enterococcus faecalis NOT DETECTED NOT DETECTED   Enterococcus Faecium NOT DETECTED NOT DETECTED   Listeria monocytogenes NOT DETECTED NOT DETECTED   Staphylococcus species DETECTED (A) NOT DETECTED   Staphylococcus aureus (BCID) DETECTED (A) NOT DETECTED   Staphylococcus epidermidis NOT DETECTED NOT DETECTED   Staphylococcus lugdunensis NOT DETECTED NOT DETECTED   Streptococcus species NOT DETECTED NOT DETECTED   Streptococcus agalactiae NOT DETECTED NOT DETECTED   Streptococcus pneumoniae NOT DETECTED NOT DETECTED   Streptococcus pyogenes NOT DETECTED NOT DETECTED   A.calcoaceticus-baumannii NOT DETECTED NOT DETECTED   Bacteroides fragilis NOT DETECTED NOT DETECTED   Enterobacterales NOT DETECTED NOT DETECTED   Enterobacter cloacae complex NOT DETECTED NOT DETECTED   Escherichia coli NOT DETECTED NOT DETECTED   Klebsiella aerogenes NOT DETECTED NOT DETECTED   Klebsiella oxytoca NOT DETECTED NOT DETECTED   Klebsiella pneumoniae NOT DETECTED NOT DETECTED   Proteus species  NOT DETECTED NOT DETECTED   Salmonella species NOT DETECTED NOT DETECTED   Serratia marcescens NOT DETECTED NOT DETECTED   Haemophilus influenzae NOT DETECTED NOT DETECTED   Neisseria meningitidis NOT DETECTED NOT DETECTED   Pseudomonas aeruginosa NOT DETECTED NOT DETECTED   Stenotrophomonas maltophilia NOT DETECTED NOT DETECTED   Candida albicans NOT DETECTED NOT DETECTED   Candida auris NOT DETECTED NOT DETECTED   Candida glabrata NOT DETECTED NOT DETECTED   Candida krusei NOT DETECTED NOT DETECTED   Candida parapsilosis NOT DETECTED NOT DETECTED   Candida tropicalis NOT DETECTED NOT DETECTED   Cryptococcus neoformans/gattii NOT DETECTED NOT DETECTED   Meth resistant mecA/C and MREJ DETECTED (A) NOT DETECTED    Cathie Hoops 12/08/2023  2:34 PM

## 2023-12-08 NOTE — Progress Notes (Signed)
PROGRESS NOTE    Jill Higgins  ZOX:[id number] DOB: [birth year] DOA: 12/07/2023 PCP: Merri Brunette, MD   Brief Narrative: Jill Higgins is a 78 y.o. female with a history of hypertension, diabetes mellitus type 2, depression, anxiety, PAD with thrombosis of right extremity iliac/common femoral and disabling claudication status post axillofemoral bypass graft.  Patient presented secondary to discharge from groin incision with associated chills concerning for evidence of infection.  Patient met sepsis criteria on admission to start empiric antibiotics.  Blood cultures obtained prior to antibiotics.  Vascular surgery consulted with plan for surgical management.   Assessment/Plan:  Severe sepsis Present on admission with associated lactic acidosis. Secondary to graft infection. Blood cultures (1/17) obtained on admission. Empiric Vancomycin and Cefepime started. -Continue antibiotics -Follow-up blood cultures  Right axillo-femoral bypass graft infection Present on admission. Surgery performed on 12/26. Vascular surgery consulted and are following. Patient started empirically on Vancomycin and Cefepime. WBC of 24,900 on admission is improving. -Continue Vancomycin and Cefepime; may need to consider ID recommendations once ready to transition to an oral regimen -Vascular surgery recommendations  PAD S/p axillo-femoral bypass graft. Patient follows with vascular surgery as an outpatient. Patient is on Lipitor, aspirin and Plavix. -Continue Lipitor, aspirin and Plavix  Primary hypertension Patient is on amlodipine, losartan and Toprol Xl as an outpatient. Antihypertensives held on admission secondary to hypotension.  Diabetes mellitus, type 2 Uncontrolled with hyperglycemia. Last hemoglobin A1C of 9.5%. Patient is managed on insulin glargine, insulin aspart, and metformin as an outpatient and started on SSI on admission. Blood sugar low-normal -Continue SSI -Hold long-acting insulin for  now.  Depression Anxiety -Continue Cymbalta  Insomnia -Continue Restoril   DVT prophylaxis: SCDs Code Status:   Code Status: Full Code Family Communication: Daughter at bedside Disposition Plan: Discharge pending ongoing vascular surgery recommendations, transition to oral antibiotics and eventual PT/OT recommendations   Consultants:  Vascular surgery  Procedures:  None  Antimicrobials: Vancomycin Cefepime    Subjective: Did not sleep well. Still with some right groin pain.  Objective: BP 104/60   Pulse 82   Temp 97.9 F (36.6 C) (Oral)   Resp 16   Wt 50 kg   SpO2 98%   BMI 19.53 kg/m   Examination:  General exam: Appears calm and comfortable Respiratory system: Clear to auscultation. Respiratory effort normal. Cardiovascular system: S1 & S2 heard, RRR. No murmurs. Gastrointestinal system: Abdomen is nondistended, soft and nontender. Normal bowel sounds heard. Central nervous system: Alert and oriented. No focal neurological deficits. Musculoskeletal: Trace edema. No calf tenderness Skin: Right surgical incision with area of slightly purulent drainage noted Psychiatry: Judgement and insight appear normal. Mood & affect appropriate.    Data Reviewed: I have personally reviewed following labs and imaging studies   Last CBC Lab Results  Component Value Date   WBC 17.4 (H) 12/08/2023   HGB 10.0 (L) 12/08/2023   HCT 29.7 (L) 12/08/2023   MCV 95.2 12/08/2023   MCH 32.1 12/08/2023   RDW 14.1 12/08/2023   PLT 177 12/08/2023     Last metabolic panel Lab Results  Component Value Date   GLUCOSE 118 (H) 12/08/2023   NA 136 12/08/2023   K 3.2 (L) 12/08/2023   CL 107 12/08/2023   CO2 22 12/08/2023   BUN 11 12/08/2023   CREATININE 0.93 12/08/2023   GFRNONAA >60 12/08/2023   CALCIUM 7.1 (L) 12/08/2023   PROT 4.2 (L) 12/08/2023   ALBUMIN 1.7 (L) 12/08/2023   BILITOT 0.6 12/08/2023  ALKPHOS 52 12/08/2023   AST 46 (H) 12/08/2023   ALT 25 12/08/2023    ANIONGAP 7 12/08/2023     Creatinine Clearance: Estimated Creatinine Clearance: 39.4 mL/min (by C-G formula based on SCr of 0.93 mg/dL).  No results found for this or any previous visit (from the past 240 hours).    Radiology Studies: CT Angio Chest/Abd/Pel for Dissection W and/or Wo Contrast Result Date: 12/07/2023 CLINICAL DATA:  Acute aortic syndrome, peripheral vascular disease status post vascular bypass grafting, incisional drainage EXAM: CT ANGIOGRAPHY CHEST, ABDOMEN AND PELVIS TECHNIQUE: Non-contrast CT of the chest was initially obtained. Multidetector CT imaging through the chest, abdomen and pelvis was performed using the standard protocol during bolus administration of intravenous contrast. Multiplanar reconstructed images and MIPs were obtained and reviewed to evaluate the vascular anatomy. RADIATION DOSE REDUCTION: This exam was performed according to the departmental dose-optimization program which includes automated exposure control, adjustment of the mA and/or kV according to patient size and/or use of iterative reconstruction technique. CONTRAST:  80mL OMNIPAQUE IOHEXOL 350 MG/ML SOLN COMPARISON:  None Available. FINDINGS: CTA CHEST FINDINGS Cardiovascular: Extensive multi-vessel coronary artery calcification. Global cardiac size within normal limits. No pericardial effusion. Central pulmonary arteries are of normal caliber. Mild atherosclerotic calcification within the thoracic aorta. No aortic aneurysm. Right axillofemoral bypass grafting has been performed with wide patency of the bypass graft. Mild perigraft infiltration and trace fluid is nonspecific and may be postsurgical in nature. The distal anastomosis of the graft in folds a patent right femoral-distal bypass graft which is incompletely visualized on this examination. The visualized portion the graft is widely patent as is the distal anastomosis. There focal dissection at the femoral anastomosis, best seen on sagittal image  # 49/11, above the level of the profundus femoral artery origin. The visualized profundus femoral artery is widely patent. Mediastinum/Nodes: No enlarged mediastinal, hilar, or axillary lymph nodes. Thyroid gland, trachea, and esophagus demonstrate no significant findings. Lungs/Pleura: Minimal biapical scarring. Lungs are otherwise clear. No pneumothorax or pleural effusion. No central obstructing lesion. Musculoskeletal: No chest wall abnormality. No acute or significant osseous findings. Review of the MIP images confirms the above findings. CTA ABDOMEN AND PELVIS FINDINGS VASCULAR Aorta: Moderate atherosclerotic calcification. No aneurysm or dissection. No periaortic infiltration or fluid collections identified. 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 Distally widely patent without aneurysm or dissection. SMA: No hemodynamically significant stenosis. No dissection or aneurysm. Renals: Less than 50% stenosis of the right renal artery proximally. Wide patency of the left renal artery. Normal vascular morphology. No aneurysm or dissection. IMA: Patent without evidence of aneurysm, dissection, vasculitis or significant stenosis. Inflow: Extensive mixed atherosclerotic plaque with chronic occlusion of the right external iliac artery. Internal iliac arteries are diseased but patent at their origins. Left lower extremity arterial inflow is diseased but without evidence of focal hemodynamically significant stenosis. Surgical changes of left femoral distal bypass grafting with wide patency of the bypass graft is identified. Veins: No obvious venous abnormality within the limitations of this arterial phase study. Review of the MIP images confirms the above findings. NON-VASCULAR Hepatobiliary: No focal liver abnormality is seen. Status post cholecystectomy. No biliary dilatation. 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 Normal enhancement the residual body and tail the pancreas.  No pancreatic ductal dilation. No peripancreatic fluid collections. Spleen: Unremarkable Adrenals/Urinary Tract: Adrenal glands are unremarkable. Kidneys are normal, without renal calculi, focal lesion, or hydronephrosis. Bladder is unremarkable. Stomach/Bowel: Stomach is within normal limits. Appendix is not  visualized and is likely absent. No evidence of bowel wall thickening, distention, or inflammatory changes. Lymphatic: There is shotty right inguinal adenopathy, nonspecific, possibly reactive in nature. Reproductive: Uterus and bilateral adnexa are unremarkable. Other: Asymmetric subcutaneous edema within the right chest abdominal wall may be postsurgical in nature. Musculoskeletal: Osseous structures are age-appropriate. No acute bone abnormality. No lytic or blastic bone lesion. Review of the MIP images confirms the above findings. IMPRESSION: 1. Surgical changes of right axillofemoral bypass grafting with wide patency of the bypass graft. Mild perigraft infiltration and trace fluid is nonspecific and may be postsurgical in nature. 2. Incomplete visualization of the right femoral-distal bypass graft. The visualized portion of the graft is widely patent. Focal dissection at the femoral anastomosis, above the level of the profundus femoral artery origin. The visualized profundus femoral artery is widely patent. 3. Surgical changes of left femoral distal bypass grafting with wide patency of the visualize bypass graft. 4. Extensive multi-vessel coronary artery calcification. 5. 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 This is of questionable clinical significance given patency of the additional mesenteric vasculature. 6. 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 Aortic Atherosclerosis (ICD10-I70.0). Electronically Signed   By: Helyn Numbers M.D.   On: 12/07/2023 23:07   DG Chest 2 View Result Date: 12/07/2023 CLINICAL DATA:  Recent vascular surgery, drainage from  incision, concern for sepsis EXAM: CHEST - 2 VIEW COMPARISON:  09/01/2019 FINDINGS: Frontal and lateral views of the chest demonstrate an unremarkable cardiac silhouette. No acute airspace disease, effusion, or pneumothorax. No acute bony abnormalities. IMPRESSION: 1. No acute intrathoracic process. Electronically Signed   By: Sharlet Salina M.D.   On: 12/07/2023 20:39      LOS: 1 day    Jacquelin Hawking, MD [facility] 12/08/2023, 7:36 AM   If 7PM-7AM, please contact night-coverage [email or web address]

## 2023-12-08 NOTE — ED Provider Notes (Signed)
Blood pressure 137/73, pulse 91, temperature 97.7 F (36.5 C), resp. rate 16, weight 50 kg, SpO2 98%.  Assuming care from Dr. Silverio Lay.  In short, Jill Higgins is a 78 y.o. female with a chief complaint of Post-op Problem .  Refer to the original H&P for additional details.  Discussed patient's case with [facility], Dr. Antionette Char to request admission. Patient and family (if present) updated with plan.   I reviewed all nursing notes, vitals, pertinent old records, EKGs, labs, imaging (as available).     Maia Plan, MD 12/08/23 0030

## 2023-12-08 NOTE — Plan of Care (Signed)
  Problem: Fluid Volume: Goal: Ability to maintain a balanced intake and output will improve Outcome: Progressing   Problem: Nutritional: Goal: Maintenance of adequate nutrition will improve Outcome: Progressing Goal: Progress toward achieving an optimal weight will improve Outcome: Progressing   Problem: Clinical Measurements: Goal: Diagnostic test results will improve Outcome: Progressing Goal: Signs and symptoms of infection will decrease Outcome: Progressing   Problem: Education: Goal: Knowledge of General Education information will improve Description: Including pain rating scale, medication(s)/side effects and non-pharmacologic comfort measures Outcome: Progressing   Problem: Clinical Measurements: Goal: Will remain free from infection Outcome: Progressing

## 2023-12-08 NOTE — Consult Note (Signed)
[facility]    Date of Admission:  12/07/2023     Reason for Consult: mrsa bacteremia    Referring Provider: Demetrius Charity     Lines:  Peripheral iv's  Abx: 1/17-c vanc  1/17-1/18 cefepime        Assessment: 78 yo female with dm2, pad, s/p several bilateral LE bypass/endarectomy, most recently hx right external iliac/common femoral stent (03/2023) complicated by thrombosis requiring right axillary-femoral bypass with on 11/15/23 admitted 12/07/23 with purulent drainage of several days out of her right groin meeting sepsis criteria and with bcx growing mrsa  Vascular surgery had evaluated/suspected graft infection, and planned cta chest abd pelv, prior to determining if surgical intervention needed  Cta on 12/07/23 with mild perigraft stranding and dissection in right groin. Await further cts input    As of 12/08/23 exam/interview she appears slightly confused/slow of thoughts. If mentation worse would get mri brain r/o septic emboli due to mrsa Can't exclude IE at this time and and given potential replacement of graft I would like to know definitively if valve is involved No other focal tenderness as of now to suspect other metastatic focus of infection   Plan: Continue vanc; stop cefepim Tte/tee; I lean toward getting tee as implication for potential repair of graft is on the table Await vascular surgery review of ct and need for surgical debridement; she appears to be a complicated case with multiple vascular intervention in the past Will need prolonged iv abx of at least 4-6 weeks prior to likely transitioning to oral suppressive tail Discussed with primary team     ------------------------------------------------ Principal Problem:   Vascular graft infection, initial encounter (HCC) Active Problems:   Hypertension   Depression with anxiety   Chronic pancreatitis (HCC)   Type 2 diabetes mellitus with vascular disease (HCC)    Peripheral arterial disease (HCC)   Sepsis (HCC)    HPI: LYLA SKORA is a 78 y.o. female with dm2, pad, s/p several bilateral LE bypass/endarectomy, most recently hx right external iliac/common femoral stent (03/2023) complicated by thrombosis requiring right axillary-femoral bypass with on 11/15/23 admitted 12/07/23 with purulent drainage of several days out of her right groin meeting sepsis criteria and with bcx growing mrsa  Patient presented with several days purulent discharge out of right groin with associated pain, decreased appetite, malaise  No other focal pain  On presentation she was borderline hypotensive but fluid responsive.  She had a low grade fever 100.8 Wbc 14 Abd pelv chest ct as below  Vascular surgery consulted and she was started on vanc/cefepime   Bcx on admission returned now with mrsa  Her fever had resolved on hD#1    Family History  Problem Relation Age of Onset   Heart disease Father    Heart attack Father        MI at age 7   Hypertension Mother    Alzheimer's disease Mother    Hypertension Sister    Diabetes Brother    Hyperlipidemia Brother    Hypertension Brother    Heart disease Brother    Heart attack Brother    Breast cancer Maternal Aunt     Social History   Tobacco Use   Smoking status: Former    Current packs/day: 0.25    Average packs/day: 0.3 packs/day for 0.5 years (0.1 ttl pk-yrs)    Types: Cigarettes    Start date: 06/2023    Quit date: 10/2014  Passive exposure: Never   Smokeless tobacco: Never   Tobacco comments:    pt states that she is using the E cig only   Vaping Use   Vaping status: Some Days  Substance Use Topics   Alcohol use: Yes    Alcohol/week: 0.0 - 1.0 standard drinks of alcohol    Comment: Occasional; 10-22 rarely    Drug use: No    Allergies  Allergen Reactions   Wound Dressing Adhesive Rash and Other (See Comments)    Pt states that tape and electrodes leave red "scars" on her skin and  her skin is very sensitive.  Paper tape ok to use    Review of Systems: ROS All Other ROS was negative, except mentioned above   Past Medical History:  Diagnosis Date   Bronchitis    Bulging disc    in neck   Depression with anxiety    Diabetes mellitus    Type II   Fibromyalgia    pt. denies   GERD (gastroesophageal reflux disease)    Glaucoma    History of bronchitis    Hyperlipidemia    Hypertension    Overactive bladder    Peripheral vascular disease (HCC)    Pneumonia    Sleep apnea    recent test negative for sleep apnea   Tobacco abuse    Urinary frequency        Scheduled Meds:  aspirin EC  81 mg Oral QHS   atorvastatin  80 mg Oral Daily   clopidogrel  75 mg Oral Q breakfast   insulin aspart  0-9 Units Subcutaneous Q4H   pantoprazole  40 mg Oral BID   sodium chloride flush  3 mL Intravenous Q12H   Continuous Infusions:  sodium chloride 80 mL/hr at 12/08/23 1217   ceFEPime (MAXIPIME) IV Stopped (12/08/23 1024)   vancomycin     PRN Meds:.acetaminophen **OR** acetaminophen, fentaNYL (SUBLIMAZE) injection, oxyCODONE, polyethylene glycol, prochlorperazine, temazepam   OBJECTIVE: Blood pressure (!) 150/117, pulse 94, temperature 98.6 F (37 C), temperature source Oral, resp. rate 17, weight 50 kg, SpO2 99%.  Physical Exam  General/constitutional: no distress, pleasant, appears slow in mentation; cooperative; conversant HEENT: Normocephalic, PER, Conj Clear, EOMI, Oropharynx clear Neck supple CV: rrr no mrg Lungs: clear to auscultation, normal respiratory effort Abd: Soft, Nontender Ext: no edema Skin: No Rash Neuro: nonfocal MSK: right groin incision mostly closed; slight tenderness; no erythema; no active drainage; bilateral feet no rash/warm   Lab Results Lab Results  Component Value Date   WBC 17.4 (H) 12/08/2023   HGB 10.0 (L) 12/08/2023   HCT 29.7 (L) 12/08/2023   MCV 95.2 12/08/2023   PLT 177 12/08/2023    Lab Results  Component  Value Date   CREATININE 0.93 12/08/2023   BUN 11 12/08/2023   NA 136 12/08/2023   K 3.2 (L) 12/08/2023   CL 107 12/08/2023   CO2 22 12/08/2023    Lab Results  Component Value Date   ALT 25 12/08/2023   AST 46 (H) 12/08/2023   ALKPHOS 52 12/08/2023   BILITOT 0.6 12/08/2023      Microbiology: Recent Results (from the past 240 hours)  Culture, blood (Routine x 2)     Status: None (Preliminary result)   Collection Time: 12/07/23  8:17 PM   Specimen: BLOOD  Result Value Ref Range Status   Specimen Description BLOOD LEFT ANTECUBITAL  Final   Special Requests   Final    BOTTLES DRAWN AEROBIC AND  ANAEROBIC Blood Culture results may not be optimal due to an inadequate volume of blood received in culture bottles   Culture  Setup Time   Final    GRAM POSITIVE COCCI ANAEROBIC BOTTLE ONLY CRITICAL RESULT CALLED TO, READ BACK BY AND VERIFIED WITH: PHARMD JESSICA MILLEN [id number] AT 1411 BY EC Performed at [facility], [address] [place], [place] [address]    Culture GRAM POSITIVE COCCI  Final   Report Status PENDING  Incomplete  Blood Culture ID Panel (Reflexed)     Status: Abnormal   Collection Time: 12/07/23  8:17 PM  Result Value Ref Range Status   Enterococcus faecalis NOT DETECTED NOT DETECTED Final   Enterococcus Faecium NOT DETECTED NOT DETECTED Final   Listeria monocytogenes NOT DETECTED NOT DETECTED Final   Staphylococcus species DETECTED (A) NOT DETECTED Final    Comment: CRITICAL RESULT CALLED TO, READ BACK BY AND VERIFIED WITH: PHARMD JESSICA MILLEN [phone] AT 1411 BY EC    Staphylococcus aureus (BCID) DETECTED (A) NOT DETECTED Final    Comment: Methicillin (oxacillin)-resistant Staphylococcus aureus (MRSA). MRSA is predictably resistant to beta-lactam antibiotics (except ceftaroline). Preferred therapy is vancomycin unless clinically contraindicated. Patient requires contact precautions if  hospitalized. CRITICAL RESULT CALLED TO, READ BACK BY AND VERIFIED  WITH: PHARMD JESSICA MILLEN [id number] AT 1411 BY EC    Staphylococcus epidermidis NOT DETECTED NOT DETECTED Final   Staphylococcus lugdunensis NOT DETECTED NOT DETECTED Final   Streptococcus species NOT DETECTED NOT DETECTED Final   Streptococcus agalactiae NOT DETECTED NOT DETECTED Final   Streptococcus pneumoniae NOT DETECTED NOT DETECTED Final   Streptococcus pyogenes NOT DETECTED NOT DETECTED Final   A.calcoaceticus-baumannii NOT DETECTED NOT DETECTED Final   Bacteroides fragilis NOT DETECTED NOT DETECTED Final   Enterobacterales NOT DETECTED NOT DETECTED Final   Enterobacter cloacae complex NOT DETECTED NOT DETECTED Final   Escherichia coli NOT DETECTED NOT DETECTED Final   Klebsiella aerogenes NOT DETECTED NOT DETECTED Final   Klebsiella oxytoca NOT DETECTED NOT DETECTED Final   Klebsiella pneumoniae NOT DETECTED NOT DETECTED Final   Proteus species NOT DETECTED NOT DETECTED Final   Salmonella species NOT DETECTED NOT DETECTED Final   Serratia marcescens NOT DETECTED NOT DETECTED Final   Haemophilus influenzae NOT DETECTED NOT DETECTED Final   Neisseria meningitidis NOT DETECTED NOT DETECTED Final   Pseudomonas aeruginosa NOT DETECTED NOT DETECTED Final   Stenotrophomonas maltophilia NOT DETECTED NOT DETECTED Final   Candida albicans NOT DETECTED NOT DETECTED Final   Candida auris NOT DETECTED NOT DETECTED Final   Candida glabrata NOT DETECTED NOT DETECTED Final   Candida krusei NOT DETECTED NOT DETECTED Final   Candida parapsilosis NOT DETECTED NOT DETECTED Final   Candida tropicalis NOT DETECTED NOT DETECTED Final   Cryptococcus neoformans/gattii NOT DETECTED NOT DETECTED Final   Meth resistant mecA/C and MREJ DETECTED (A) NOT DETECTED Final    Comment: CRITICAL RESULT CALLED TO, READ BACK BY AND VERIFIED WITH: PHARMD JESSICA MILLEN [phone] AT 1411 BY EC Performed at [facility], [address], [place], [place] [address]   Culture, blood (Routine x 2)     Status:  None (Preliminary result)   Collection Time: 12/07/23  8:20 PM   Specimen: BLOOD RIGHT ARM  Result Value Ref Range Status   Specimen Description BLOOD RIGHT ARM  Final   Special Requests   Final    BOTTLES DRAWN AEROBIC AND ANAEROBIC Blood Culture results may not be optimal due to an inadequate  volume of blood received in culture bottles   Culture   Final    NO GROWTH < 12 HOURS Performed at [facility], [address], [place], [place] [address]    Report Status PENDING  Incomplete     Serology:    Imaging: If present, new imagings (plain films, ct scans, and mri) have been personally visualized and interpreted; radiology reports have been reviewed. Decision making incorporated into the Impression / Recommendations.  12/07/23 cta chest abd pelv 1. Surgical changes of right axillofemoral bypass grafting with wide patency of the bypass graft. Mild perigraft infiltration and trace fluid is nonspecific and may be postsurgical in nature. 2. Incomplete visualization of the right femoral-distal bypass graft. The visualized portion of the graft is widely patent. Focal dissection at the femoral anastomosis, above the level of the profundus femoral artery origin. The visualized profundus femoral artery is widely patent. 3. Surgical changes of left femoral distal bypass grafting with wide patency of the visualize bypass graft. 4. Extensive multi-vessel coronary artery calcification. 5. 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 This is of questionable clinical significance given patency of the additional mesenteric vasculature. 6. 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  Raymondo Band, MD [facility] [phone] pager    12/08/2023, 3:06 PM

## 2023-12-08 NOTE — Progress Notes (Signed)
VASCULAR LAB    Upper extremity vein mapping has been performed.  See CV proc for preliminary results.   Orhan Mayorga, RVT 12/08/2023, 1:08 PM

## 2023-12-08 NOTE — ED Notes (Signed)
RN attempted IV access, unsuccessful.

## 2023-12-09 ENCOUNTER — Encounter (HOSPITAL_COMMUNITY): Payer: Self-pay | Admitting: Family Medicine

## 2023-12-09 ENCOUNTER — Inpatient Hospital Stay (HOSPITAL_COMMUNITY): Payer: Medicare Other | Admitting: Anesthesiology

## 2023-12-09 ENCOUNTER — Encounter (HOSPITAL_COMMUNITY)
Admission: EM | Disposition: A | Discharge status: Skilled Nursing Facility | Payer: Self-pay | Source: Home / Self Care | Attending: Internal Medicine

## 2023-12-09 DIAGNOSIS — T827XXA Infection and inflammatory reaction due to other cardiac and vascular devices, implants and grafts, initial encounter: Secondary | ICD-10-CM | POA: Diagnosis not present

## 2023-12-09 HISTORY — PX: APPLICATION OF WOUND VAC: SHX5189

## 2023-12-09 HISTORY — PX: AXILLARY-FEMORAL BYPASS GRAFT: SHX894

## 2023-12-09 HISTORY — PX: VEIN HARVEST: SHX6363

## 2023-12-09 HISTORY — PX: VEIN REPAIR: SHX6524

## 2023-12-09 HISTORY — PX: FEMORAL ARTERY EXPLORATION: SHX5160

## 2023-12-09 LAB — POCT I-STAT 7, (LYTES, BLD GAS, ICA,H+H)
Acid-base deficit: 4 mmol/L — ABNORMAL HIGH (ref 0.0–2.0)
Bicarbonate: 21.4 mmol/L (ref 20.0–28.0)
Calcium, Ion: 1.11 mmol/L — ABNORMAL LOW (ref 1.15–1.40)
HCT: 21 % — ABNORMAL LOW (ref 36.0–46.0)
Hemoglobin: 7.1 g/dL — ABNORMAL LOW (ref 12.0–15.0)
O2 Saturation: 100 %
Potassium: 3.9 mmol/L (ref 3.5–5.1)
Sodium: 136 mmol/L (ref 135–145)
TCO2: 23 mmol/L (ref 22–32)
pCO2 arterial: 39.7 mm[Hg] (ref 32–48)
pH, Arterial: 7.338 — ABNORMAL LOW (ref 7.35–7.45)
pO2, Arterial: 177 mm[Hg] — ABNORMAL HIGH (ref 83–108)

## 2023-12-09 LAB — COMPREHENSIVE METABOLIC PANEL
ALT: 22 U/L (ref 0–44)
AST: 34 U/L (ref 15–41)
Albumin: 1.7 g/dL — ABNORMAL LOW (ref 3.5–5.0)
Alkaline Phosphatase: 62 U/L (ref 38–126)
Anion gap: 7 (ref 5–15)
BUN: 10 mg/dL (ref 8–23)
CO2: 21 mmol/L — ABNORMAL LOW (ref 22–32)
Calcium: 7.1 mg/dL — ABNORMAL LOW (ref 8.9–10.3)
Chloride: 106 mmol/L (ref 98–111)
Creatinine, Ser: 0.81 mg/dL (ref 0.44–1.00)
GFR, Estimated: >60 mL/min (ref >60)
Glucose, Bld: 92 mg/dL (ref 70–99)
Potassium: 2.7 mmol/L — CL (ref 3.5–5.1)
Sodium: 134 mmol/L — ABNORMAL LOW (ref 135–145)
Total Bilirubin: 0.5 mg/dL (ref 0.0–1.2)
Total Protein: 4.3 g/dL — ABNORMAL LOW (ref 6.5–8.1)

## 2023-12-09 LAB — POCT I-STAT, CHEM 8
BUN: 8 mg/dL (ref 8–23)
Calcium, Ion: 1.14 mmol/L — ABNORMAL LOW (ref 1.15–1.40)
Chloride: 102 mmol/L (ref 98–111)
Creatinine, Ser: 0.6 mg/dL (ref 0.44–1.00)
Glucose, Bld: 176 mg/dL — ABNORMAL HIGH (ref 70–99)
HCT: 27 % — ABNORMAL LOW (ref 36.0–46.0)
Hemoglobin: 9.2 g/dL — ABNORMAL LOW (ref 12.0–15.0)
Potassium: 3.3 mmol/L — ABNORMAL LOW (ref 3.5–5.1)
Sodium: 135 mmol/L (ref 135–145)
TCO2: 22 mmol/L (ref 22–32)

## 2023-12-09 LAB — CBC
HCT: 30.7 % — ABNORMAL LOW (ref 36.0–46.0)
Hemoglobin: 10.3 g/dL — ABNORMAL LOW (ref 12.0–15.0)
MCH: 31.2 pg (ref 26.0–34.0)
MCHC: 33.6 g/dL (ref 30.0–36.0)
MCV: 93 fL (ref 80.0–100.0)
Platelets: 172 10*3/uL (ref 150–400)
RBC: 3.3 MIL/uL — ABNORMAL LOW (ref 3.87–5.11)
RDW: 14 % (ref 11.5–15.5)
WBC: 13.9 10*3/uL — ABNORMAL HIGH (ref 4.0–10.5)
nRBC: 0 % (ref 0.0–0.2)

## 2023-12-09 LAB — GLUCOSE, CAPILLARY
Glucose-Capillary: 94 mg/dL (ref 70–99)
Glucose-Capillary: 81 mg/dL (ref 70–99)
Glucose-Capillary: 142 mg/dL — ABNORMAL HIGH (ref 70–99)
Glucose-Capillary: 156 mg/dL — ABNORMAL HIGH (ref 70–99)
Glucose-Capillary: 203 mg/dL — ABNORMAL HIGH (ref 70–99)

## 2023-12-09 LAB — PREPARE RBC (CROSSMATCH)

## 2023-12-09 SURGERY — CREATION, BYPASS, ARTERIAL, AXILLARY TO BILATERAL FEMORAL, USING GRAFT
Anesthesia: General | Site: Groin | Laterality: Right

## 2023-12-09 MED ORDER — DOCUSATE SODIUM 100 MG PO CAPS
100.0000 mg | ORAL_CAPSULE | Freq: Every day | ORAL | Status: DC
  Administered 2023-12-10 – 2023-12-19 (×7): 100 mg via ORAL
  Filled 2023-12-09 (×8): qty 1

## 2023-12-09 MED ORDER — OXYCODONE HCL 5 MG PO TABS
ORAL_TABLET | ORAL | Status: AC
  Filled 2023-12-09: qty 1

## 2023-12-09 MED ORDER — SODIUM CHLORIDE 0.9 % IV SOLN: 500.0000 mL | Freq: Once | INTRAVENOUS | Status: AC | PRN

## 2023-12-09 MED ORDER — FENTANYL CITRATE (PF) 100 MCG/2ML IJ SOLN: 25.0000 ug | INTRAMUSCULAR | Status: DC | PRN

## 2023-12-09 MED ORDER — POTASSIUM CHLORIDE 10 MEQ/100ML IV SOLN
10.0000 meq | INTRAVENOUS | Status: AC
  Administered 2023-12-09 (×4): 10 meq via INTRAVENOUS
  Filled 2023-12-09 (×2): qty 100

## 2023-12-09 MED ORDER — POTASSIUM CHLORIDE 10 MEQ/100ML IV SOLN
INTRAVENOUS | Status: AC
  Filled 2023-12-09: qty 100

## 2023-12-09 MED ORDER — DEXAMETHASONE SODIUM PHOSPHATE 10 MG/ML IJ SOLN
INTRAMUSCULAR | Status: DC | PRN
  Administered 2023-12-09: 5 mg via INTRAVENOUS

## 2023-12-09 MED ORDER — ACETAMINOPHEN 10 MG/ML IV SOLN: 1000.0000 mg | Freq: Once | INTRAVENOUS | Status: DC | PRN

## 2023-12-09 MED ORDER — FENTANYL CITRATE (PF) 100 MCG/2ML IJ SOLN: INTRAMUSCULAR | Status: DC | PRN

## 2023-12-09 MED ORDER — ROCURONIUM BROMIDE 10 MG/ML (PF) SYRINGE
PREFILLED_SYRINGE | INTRAVENOUS | Status: AC
  Filled 2023-12-09: qty 10

## 2023-12-09 MED ORDER — MAGNESIUM SULFATE 2 GM/50ML IV SOLN: 2.0000 g | Freq: Every day | INTRAVENOUS | Status: DC | PRN

## 2023-12-09 MED ORDER — OXYCODONE HCL 5 MG PO TABS
5.0000 mg | ORAL_TABLET | ORAL | Status: DC | PRN
  Administered 2023-12-09: 5 mg via ORAL
  Administered 2023-12-09 – 2023-12-13 (×9): 10 mg via ORAL
  Administered 2023-12-13 – 2023-12-14 (×3): 5 mg via ORAL
  Administered 2023-12-14: 10 mg via ORAL
  Administered 2023-12-15 – 2023-12-17 (×9): 5 mg via ORAL
  Administered 2023-12-18 – 2023-12-19 (×4): 10 mg via ORAL
  Filled 2023-12-09 (×2): qty 2
  Filled 2023-12-09 (×7): qty 1
  Filled 2023-12-09 (×4): qty 2
  Filled 2023-12-09 (×3): qty 1
  Filled 2023-12-09 (×2): qty 2
  Filled 2023-12-09 (×3): qty 1
  Filled 2023-12-09 (×6): qty 2

## 2023-12-09 MED ORDER — FENTANYL CITRATE (PF) 250 MCG/5ML IJ SOLN
INTRAMUSCULAR | Status: AC
  Filled 2023-12-09: qty 5

## 2023-12-09 MED ORDER — FENTANYL CITRATE (PF) 100 MCG/2ML IJ SOLN
INTRAMUSCULAR | Status: AC
  Administered 2023-12-09: 25 ug via INTRAVENOUS
  Filled 2023-12-09: qty 2

## 2023-12-09 MED ORDER — PROPOFOL 10 MG/ML IV BOLUS
INTRAVENOUS | Status: AC
  Filled 2023-12-09: qty 20

## 2023-12-09 MED ORDER — HEPARIN 6000 UNIT IRRIGATION SOLUTION
Status: AC
  Filled 2023-12-09: qty 500

## 2023-12-09 MED ORDER — PHENYLEPHRINE 80 MCG/ML (10ML) SYRINGE FOR IV PUSH (FOR BLOOD PRESSURE SUPPORT)
PREFILLED_SYRINGE | INTRAVENOUS | Status: DC | PRN
  Administered 2023-12-09: 160 ug via INTRAVENOUS
  Administered 2023-12-09 (×2): 80 ug via INTRAVENOUS

## 2023-12-09 MED ORDER — POTASSIUM CHLORIDE CRYS ER 20 MEQ PO TBCR: 20.0000 meq | EXTENDED_RELEASE_TABLET | Freq: Every day | ORAL | Status: DC | PRN

## 2023-12-09 MED ORDER — VANCOMYCIN HCL 1000 MG IV SOLR
750.0000 mg | INTRAVENOUS | Status: DC
  Administered 2023-12-09: 750 mg via INTRAVENOUS
  Filled 2023-12-09 (×2): qty 15

## 2023-12-09 MED ORDER — PHENYLEPHRINE HCL-NACL 20-0.9 MG/250ML-% IV SOLN
INTRAVENOUS | Status: DC | PRN
  Administered 2023-12-09: 30 ug/min via INTRAVENOUS

## 2023-12-09 MED ORDER — 0.9 % SODIUM CHLORIDE (POUR BTL) OPTIME
TOPICAL | Status: DC | PRN
  Administered 2023-12-09: 2000 mL

## 2023-12-09 MED ORDER — PROPOFOL 10 MG/ML IV BOLUS
INTRAVENOUS | Status: DC | PRN
  Administered 2023-12-09: 50 mg via INTRAVENOUS
  Administered 2023-12-09: 100 mg via INTRAVENOUS

## 2023-12-09 MED ORDER — HEPARIN 6000 UNIT IRRIGATION SOLUTION
Status: DC | PRN
  Administered 2023-12-09: 1

## 2023-12-09 MED ORDER — ALBUMIN HUMAN 5 % IV SOLN: INTRAVENOUS | Status: DC | PRN

## 2023-12-09 MED ORDER — HEMOSTATIC AGENTS (NO CHARGE) OPTIME
TOPICAL | Status: DC | PRN
  Administered 2023-12-09: 1 via TOPICAL

## 2023-12-09 MED ORDER — VANCOMYCIN HCL IN DEXTROSE 1-5 GM/200ML-% IV SOLN: 1000.0000 mg | Freq: Once | INTRAVENOUS | Status: DC

## 2023-12-09 MED ORDER — FENTANYL CITRATE (PF) 100 MCG/2ML IJ SOLN
INTRAMUSCULAR | Status: AC
  Filled 2023-12-09: qty 2

## 2023-12-09 MED ORDER — PHENOL 1.4 % MT LIQD: 1.0000 | OROMUCOSAL | Status: DC | PRN

## 2023-12-09 MED ORDER — INSULIN ASPART 100 UNIT/ML IJ SOLN
0.0000 [IU] | INTRAMUSCULAR | Status: DC | PRN
Start: 2023-12-09 — End: 2023-12-09

## 2023-12-09 MED ORDER — VANCOMYCIN HCL IN DEXTROSE 1-5 GM/200ML-% IV SOLN
INTRAVENOUS | Status: AC
  Filled 2023-12-09: qty 200

## 2023-12-09 MED ORDER — HEPARIN SODIUM (PORCINE) 1000 UNIT/ML IJ SOLN
INTRAMUSCULAR | Status: DC | PRN
Start: 2023-12-09 — End: 2023-12-09
  Administered 2023-12-09: 5000 [IU] via INTRAVENOUS

## 2023-12-09 MED ORDER — LACTATED RINGERS IV SOLN: INTRAVENOUS | Status: DC

## 2023-12-09 MED ORDER — ONDANSETRON HCL 4 MG/2ML IJ SOLN
INTRAMUSCULAR | Status: DC | PRN
  Administered 2023-12-09: 4 mg via INTRAVENOUS

## 2023-12-09 MED ORDER — POTASSIUM CHLORIDE 10 MEQ/100ML IV SOLN
10.0000 meq | INTRAVENOUS | Status: DC
  Filled 2023-12-09: qty 100

## 2023-12-09 MED ORDER — ROCURONIUM BROMIDE 10 MG/ML (PF) SYRINGE
PREFILLED_SYRINGE | INTRAVENOUS | Status: DC | PRN
  Administered 2023-12-09 (×3): 20 mg via INTRAVENOUS
  Administered 2023-12-09: 50 mg via INTRAVENOUS

## 2023-12-09 MED ORDER — SUGAMMADEX SODIUM 200 MG/2ML IV SOLN
INTRAVENOUS | Status: DC | PRN
  Administered 2023-12-09: 100 mg via INTRAVENOUS

## 2023-12-09 MED ORDER — CHLORHEXIDINE GLUCONATE 0.12 % MT SOLN: 15.0000 mL | Freq: Once | OROMUCOSAL | Status: AC

## 2023-12-09 MED ORDER — SODIUM CHLORIDE 0.9 % IV SOLN: INTRAVENOUS | Status: AC

## 2023-12-09 MED ORDER — MIDAZOLAM HCL 2 MG/2ML IJ SOLN
INTRAMUSCULAR | Status: AC
  Filled 2023-12-09: qty 2

## 2023-12-09 MED ORDER — MAGNESIUM SULFATE 4 GM/100ML IV SOLN
4.0000 g | Freq: Once | INTRAVENOUS | Status: AC
  Administered 2023-12-09: 4 g via INTRAVENOUS
  Filled 2023-12-09: qty 100

## 2023-12-09 MED ORDER — ORAL CARE MOUTH RINSE
15.0000 mL | Freq: Once | OROMUCOSAL | Status: AC
  Administered 2023-12-09: 15 mL via OROMUCOSAL

## 2023-12-09 MED ORDER — FENTANYL CITRATE (PF) 100 MCG/2ML IJ SOLN
25.0000 ug | INTRAMUSCULAR | Status: DC | PRN
  Administered 2023-12-09 (×3): 25 ug via INTRAVENOUS
  Administered 2023-12-09: 50 ug via INTRAVENOUS

## 2023-12-09 MED ORDER — LACTATED RINGERS IV SOLN: INTRAVENOUS | Status: DC | PRN

## 2023-12-09 MED ORDER — SODIUM CHLORIDE 0.9% IV SOLUTION
Freq: Once | INTRAVENOUS | Status: AC
Start: 2023-12-09 — End: 2023-12-09

## 2023-12-09 MED ORDER — ACETAMINOPHEN 10 MG/ML IV SOLN
INTRAVENOUS | Status: AC
  Administered 2023-12-09: 1000 mg via INTRAVENOUS
  Filled 2023-12-09: qty 100

## 2023-12-09 MED ORDER — LIDOCAINE 2% (20 MG/ML) 5 ML SYRINGE
INTRAMUSCULAR | Status: DC | PRN
  Administered 2023-12-09: 40 mg via INTRAVENOUS

## 2023-12-09 MED ORDER — FENTANYL CITRATE (PF) 250 MCG/5ML IJ SOLN
INTRAMUSCULAR | Status: DC | PRN
  Administered 2023-12-09 (×2): 25 ug via INTRAVENOUS
  Administered 2023-12-09: 100 ug via INTRAVENOUS
  Administered 2023-12-09 (×2): 25 ug via INTRAVENOUS
  Administered 2023-12-09 (×2): 50 ug via INTRAVENOUS
  Administered 2023-12-09: 25 ug via INTRAVENOUS

## 2023-12-09 SURGICAL SUPPLY — 54 items
BAG COUNTER SPONGE SURGICOUNT (BAG) ×5 IMPLANT
CANISTER SUCT 3000ML PPV (MISCELLANEOUS) ×5 IMPLANT
CANISTER WOUNDNEG PRESSURE 500 (CANNISTER) IMPLANT
CATH EMB 4FR 40 (CATHETERS) IMPLANT
CLIP TI MEDIUM 24 (CLIP) ×5 IMPLANT
CLIP TI WIDE RED SMALL 24 (CLIP) ×5 IMPLANT
CNTNR URN SCR LID CUP LEK RST (MISCELLANEOUS) IMPLANT
DERMABOND ADVANCED .7 DNX12 (GAUZE/BANDAGES/DRESSINGS) IMPLANT
DRAIN JP 15F RND RADIO PRF (DRAIN) IMPLANT
DRAIN SNY 10X20 3/4 PERF (WOUND CARE) IMPLANT
DRAPE INCISE IOBAN 66X45 STRL (DRAPES) ×5 IMPLANT
DRAPE SURG ORHT 6 SPLT 77X108 (DRAPES) IMPLANT
DRSG COVADERM 4X6 (GAUZE/BANDAGES/DRESSINGS) IMPLANT
DRSG VAC GRANUFOAM SM (GAUZE/BANDAGES/DRESSINGS) IMPLANT
ELECT REM PT RETURN 9FT ADLT (ELECTROSURGICAL) ×4
ELECTRODE REM PT RTRN 9FT ADLT (ELECTROSURGICAL) ×5 IMPLANT
EVACUATOR SILICONE 100CC (DRAIN) IMPLANT
GAUZE 4X4 16PLY ~~LOC~~+RFID DBL (SPONGE) IMPLANT
GAUZE SPONGE 4X4 12PLY STRL (GAUZE/BANDAGES/DRESSINGS) IMPLANT
GLOVE BIO SURGEON STRL SZ 6.5 (GLOVE) IMPLANT
GLOVE BIO SURGEON STRL SZ7.5 (GLOVE) ×5 IMPLANT
GLOVE BIOGEL PI IND STRL 7.5 (GLOVE) IMPLANT
GLOVE BIOGEL PI IND STRL 8 (GLOVE) ×5 IMPLANT
GOWN STRL REUS W/ TWL LRG LVL3 (GOWN DISPOSABLE) ×10 IMPLANT
GOWN STRL REUS W/ TWL XL LVL3 (GOWN DISPOSABLE) ×10 IMPLANT
HEMOSTAT SNOW SURGICEL 2X4 (HEMOSTASIS) IMPLANT
HEMOSTAT SPONGE AVITENE ULTRA (HEMOSTASIS) IMPLANT
INSERT FOGARTY SM (MISCELLANEOUS) IMPLANT
KIT BASIN OR (CUSTOM PROCEDURE TRAY) ×5 IMPLANT
KIT TURNOVER KIT B (KITS) ×5 IMPLANT
NS IRRIG 1000ML POUR BTL (IV SOLUTION) ×10 IMPLANT
PACK PERIPHERAL VASCULAR (CUSTOM PROCEDURE TRAY) ×5 IMPLANT
PAD ARMBOARD 7.5X6 YLW CONV (MISCELLANEOUS) ×10 IMPLANT
POWDER SURGICEL 3.0 GRAM (HEMOSTASIS) IMPLANT
SPONGE INTESTINAL PEANUT (DISPOSABLE) IMPLANT
STAPLER SKIN PROX WIDE 3.9 (STAPLE) IMPLANT
STAPLER VISISTAT 35W (STAPLE) IMPLANT
STOPCOCK 4 WAY LG BORE MALE ST (IV SETS) IMPLANT
STRIP CLOSURE SKIN 1/2X4 (GAUZE/BANDAGES/DRESSINGS) IMPLANT
SUT ETHILON 3 0 PS 1 (SUTURE) IMPLANT
SUT MNCRL AB 4-0 PS2 18 (SUTURE) ×10 IMPLANT
SUT PROLENE 5 0 C 1 24 (SUTURE) ×15 IMPLANT
SUT PROLENE 6 0 BV (SUTURE) ×5 IMPLANT
SUT SILK 2 0 PERMA HAND 18 BK (SUTURE) IMPLANT
SUT SILK 3-0 18XBRD TIE 12 (SUTURE) IMPLANT
SUT VIC AB 2-0 CT1 TAPERPNT 27 (SUTURE) ×15 IMPLANT
SUT VIC AB 3-0 SH 27X BRD (SUTURE) ×15 IMPLANT
SWAB CULTURE ESWAB REG 1ML (MISCELLANEOUS) IMPLANT
SWAB CULTURE LIQ STUART DBL (MISCELLANEOUS) IMPLANT
SYR 3ML LL SCALE MARK (SYRINGE) IMPLANT
TAPE CLOTH SURG 4X10 WHT LF (GAUZE/BANDAGES/DRESSINGS) IMPLANT
TOWEL GREEN STERILE (TOWEL DISPOSABLE) ×5 IMPLANT
TRAY FOLEY MTR SLVR 16FR STAT (SET/KITS/TRAYS/PACK) ×5 IMPLANT
WATER STERILE IRR 1000ML POUR (IV SOLUTION) ×5 IMPLANT

## 2023-12-09 NOTE — Progress Notes (Signed)
PROGRESS NOTE    Jill Higgins  ZOX:[id number] DOB: 1946/08/19 DOA: 12/07/2023 PCP: Merri Brunette, MD   Brief Narrative: Jill Higgins is a 78 y.o. female with a history of hypertension, diabetes mellitus type 2, depression, anxiety, PAD with thrombosis of right extremity iliac/common femoral and disabling claudication status post axillofemoral bypass graft.  Patient presented secondary to discharge from groin incision with associated chills concerning for evidence of infection.  Patient met sepsis criteria on admission to start empiric antibiotics.  Blood cultures obtained prior to antibiotics.  Vascular surgery consulted with plan for surgical management.   Assessment/Plan:  Severe sepsis Present on admission with associated lactic acidosis. Secondary to graft infection. Blood cultures (1/17) obtained on admission and are significant for MRSA. Empiric Vancomycin and Cefepime started and transitioned to Vancomycin monotherapy. ID consulted.  Right axillo-femoral bypass graft infection Present on admission. Surgery performed on 12/26. Vascular surgery consulted and are following. Patient started empirically on Vancomycin and Cefepime. WBC of 24,900 on admission is improving. Cefepime discontinued. -Continue Vancomycin  -Vascular surgery recommendations: surgery today  MRSA bacteremia Source is likely right groin wound. ID consulted  PAD S/p axillo-femoral bypass graft. Patient follows with vascular surgery as an outpatient. Patient is on Lipitor, aspirin and Plavix. -Continue Lipitor, aspirin and Plavix  Primary hypertension Patient is on amlodipine, losartan and Toprol Xl as an outpatient. Antihypertensives held on admission secondary to hypotension.  Diabetes mellitus, type 2 Uncontrolled with hyperglycemia. Last hemoglobin A1C of 9.5%. Patient is managed on insulin glargine, insulin aspart, and metformin as an outpatient and started on SSI on admission. Blood sugar  low-normal -Continue SSI -Hold long-acting insulin for now.  Depression Anxiety -Continue Cymbalta  Insomnia -Continue Restoril   DVT prophylaxis: SCDs Code Status:   Code Status: Full Code Family Communication: Sister at bedside Disposition Plan: Discharge pending ongoing vascular surgery recommendations, ID recommendations, transition to outpatient antibiotics and eventual PT/OT recommendations   Consultants:  Vascular surgery Infectious disease  Procedures:  None  Antimicrobials: Vancomycin Cefepime   Subjective: Patient seen in PACU prior to surgery. Patient without specific concerns this morning. No issues this morning.  Objective: BP 121/66 (BP Location: Left Arm)   Pulse 66   Temp 98.5 F (36.9 C) (Oral)   Resp 18   Ht 5' 2.99" (1.6 m)   Wt 50 kg   SpO2 100%   BMI 19.53 kg/m   Examination:  General exam: Appears calm and comfortable Respiratory system: Clear to auscultation. Respiratory effort normal. Cardiovascular system: S1 & S2 heard, RRR. Gastrointestinal system: Abdomen is nondistended, soft and nontender. Normal bowel sounds heard. Central nervous system: Alert and oriented. No focal neurological deficits. Psychiatry: Judgement and insight appear normal. Mood & affect appropriate.    Data Reviewed: I have personally reviewed following labs and imaging studies   Last CBC Lab Results  Component Value Date   WBC 13.9 (H) 12/09/2023   HGB 10.3 (L) 12/09/2023   HCT 30.7 (L) 12/09/2023   MCV 93.0 12/09/2023   MCH 31.2 12/09/2023   RDW 14.0 12/09/2023   PLT 172 12/09/2023     Last metabolic panel Lab Results  Component Value Date   GLUCOSE 92 12/09/2023   NA 134 (L) 12/09/2023   K 2.7 (LL) 12/09/2023   CL 106 12/09/2023   CO2 21 (L) 12/09/2023   BUN 10 12/09/2023   CREATININE 0.81 12/09/2023   GFRNONAA >60 12/09/2023   CALCIUM 7.1 (L) 12/09/2023   PROT 4.3 (L) 12/09/2023  ALBUMIN 1.7 (L) 12/09/2023   BILITOT 0.5 12/09/2023    ALKPHOS 62 12/09/2023   AST 34 12/09/2023   ALT 22 12/09/2023   ANIONGAP 7 12/09/2023     Creatinine Clearance: Estimated Creatinine Clearance: 45.2 mL/min (by C-G formula based on SCr of 0.81 mg/dL).  Recent Results (from the past 240 hours)  Culture, blood (Routine x 2)     Status: None (Preliminary result)   Collection Time: 12/07/23  8:17 PM   Specimen: BLOOD  Result Value Ref Range Status   Specimen Description BLOOD LEFT ANTECUBITAL  Final   Special Requests   Final    BOTTLES DRAWN AEROBIC AND ANAEROBIC Blood Culture results may not be optimal due to an inadequate volume of blood received in culture bottles   Culture  Setup Time   Final    GRAM POSITIVE COCCI ANAEROBIC BOTTLE ONLY CRITICAL RESULT CALLED TO, READ BACK BY AND VERIFIED WITH: PHARMD JESSICA MILLEN [id number] AT 1411 BY EC Performed at [facility], [address] [place], [place] [address]    Culture GRAM POSITIVE COCCI  Final   Report Status PENDING  Incomplete  Blood Culture ID Panel (Reflexed)     Status: Abnormal   Collection Time: 12/07/23  8:17 PM  Result Value Ref Range Status   Enterococcus faecalis NOT DETECTED NOT DETECTED Final   Enterococcus Faecium NOT DETECTED NOT DETECTED Final   Listeria monocytogenes NOT DETECTED NOT DETECTED Final   Staphylococcus species DETECTED (A) NOT DETECTED Final    Comment: CRITICAL RESULT CALLED TO, READ BACK BY AND VERIFIED WITH: PHARMD JESSICA MILLEN [phone] AT 1411 BY EC    Staphylococcus aureus (BCID) DETECTED (A) NOT DETECTED Final    Comment: Methicillin (oxacillin)-resistant Staphylococcus aureus (MRSA). MRSA is predictably resistant to beta-lactam antibiotics (except ceftaroline). Preferred therapy is vancomycin unless clinically contraindicated. Patient requires contact precautions if  hospitalized. CRITICAL RESULT CALLED TO, READ BACK BY AND VERIFIED WITH: PHARMD JESSICA MILLEN [id number] AT 1411 BY EC    Staphylococcus epidermidis NOT DETECTED NOT  DETECTED Final   Staphylococcus lugdunensis NOT DETECTED NOT DETECTED Final   Streptococcus species NOT DETECTED NOT DETECTED Final   Streptococcus agalactiae NOT DETECTED NOT DETECTED Final   Streptococcus pneumoniae NOT DETECTED NOT DETECTED Final   Streptococcus pyogenes NOT DETECTED NOT DETECTED Final   A.calcoaceticus-baumannii NOT DETECTED NOT DETECTED Final   Bacteroides fragilis NOT DETECTED NOT DETECTED Final   Enterobacterales NOT DETECTED NOT DETECTED Final   Enterobacter cloacae complex NOT DETECTED NOT DETECTED Final   Escherichia coli NOT DETECTED NOT DETECTED Final   Klebsiella aerogenes NOT DETECTED NOT DETECTED Final   Klebsiella oxytoca NOT DETECTED NOT DETECTED Final   Klebsiella pneumoniae NOT DETECTED NOT DETECTED Final   Proteus species NOT DETECTED NOT DETECTED Final   Salmonella species NOT DETECTED NOT DETECTED Final   Serratia marcescens NOT DETECTED NOT DETECTED Final   Haemophilus influenzae NOT DETECTED NOT DETECTED Final   Neisseria meningitidis NOT DETECTED NOT DETECTED Final   Pseudomonas aeruginosa NOT DETECTED NOT DETECTED Final   Stenotrophomonas maltophilia NOT DETECTED NOT DETECTED Final   Candida albicans NOT DETECTED NOT DETECTED Final   Candida auris NOT DETECTED NOT DETECTED Final   Candida glabrata NOT DETECTED NOT DETECTED Final   Candida krusei NOT DETECTED NOT DETECTED Final   Candida parapsilosis NOT DETECTED NOT DETECTED Final   Candida tropicalis NOT DETECTED NOT DETECTED Final   Cryptococcus neoformans/gattii NOT DETECTED NOT DETECTED Final   Meth resistant mecA/C  and MREJ DETECTED (A) NOT DETECTED Final    Comment: CRITICAL RESULT CALLED TO, READ BACK BY AND VERIFIED WITH: PHARMD JESSICA MILLEN [phone] AT 1411 BY EC Performed at [facility], [address], [place], [place] [address]   Culture, blood (Routine x 2)     Status: None (Preliminary result)   Collection Time: 12/07/23  8:20 PM   Specimen: BLOOD RIGHT ARM  Result  Value Ref Range Status   Specimen Description BLOOD RIGHT ARM  Final   Special Requests   Final    BOTTLES DRAWN AEROBIC AND ANAEROBIC Blood Culture results may not be optimal due to an inadequate volume of blood received in culture bottles   Culture   Final    NO GROWTH < 12 HOURS Performed at [facility], [address], [place], [place] [address]    Report Status PENDING  Incomplete  Surgical pcr screen     Status: Abnormal   Collection Time: 12/08/23  7:57 PM   Specimen: Nasal Mucosa; Nasal Swab  Result Value Ref Range Status   MRSA, PCR POSITIVE (A) NEGATIVE Final    Comment: RESULT CALLED TO, READ BACK BY AND VERIFIED WITH: T IRBY,RN@2258  12/08/23 MK    Staphylococcus aureus POSITIVE (A) NEGATIVE Final    Comment: (NOTE) The Xpert SA Assay (FDA approved for NASAL specimens in patients 5 years of age and older), is one component of a comprehensive surveillance program. It is not intended to diagnose infection nor to guide or monitor treatment. Performed at [facility], [address], [place], [place] [address]       Radiology Studies: VAS [place] UPPER EXT VEIN MAPPING (PRE-OP AVF) Result Date: 12/08/2023 UPPER EXTREMITY VEIN MAPPING Patient Name:  EVAJEAN PENISTON  Date of Exam:   12/08/2023 Medical Rec #: [id number]        Accession #:    [id number] Date of Birth: 1945/12/13         Patient Gender: F Patient Age:   89 years Exam Location:  [facility] Procedure:      VAS [place] UPPER EXT VEIN MAPPING (PRE-OP AVF) Referring Phys: JOSHUA ROBINS --------------------------------------------------------------------------------  Indications: History of PAD; patient is pre-operative for bypass. History: Right groin purulent drainage after axillofemoral bypass. Axillofemoral          bypass 11/15/23. Stenting of right iliac and common femoral arteries          04/20/23. Right common femoral endarterectomy and right femoral to above          knee popliteal bypass with vein  06/03/2018.  Limitations: Patient constant movement, grabbing rails of bed and pulling              herself up, grabbing technologist's arm, lines and bandages Comparison Study: No prior study on file Performing Technologist: Sherren Kerns RVS  Examination Guidelines: A complete evaluation includes B-mode imaging, spectral Doppler, color Doppler, and power Doppler as needed of all accessible portions of each vessel. Bilateral testing is considered an integral part of a complete examination. Limited examinations for reoccurring indications may be performed as noted. +-----------------+-------------+----------+--------------------------------+ Right Cephalic   Diameter (cm)Depth (cm)            Findings             +-----------------+-------------+----------+--------------------------------+ Prox upper arm       0.27        0.48                                    +-----------------+-------------+----------+--------------------------------+  Mid upper arm        0.26        0.58                                    +-----------------+-------------+----------+--------------------------------+ Dist upper arm       0.26        0.35                                    +-----------------+-------------+----------+--------------------------------+ Antecubital fossa    0.44        0.28                                    +-----------------+-------------+----------+--------------------------------+ Prox forearm       0.30/0.33  1.06/0.49            branching             +-----------------+-------------+----------+--------------------------------+ Mid forearm                             not visualized and bandages/line +-----------------+-------------+----------+--------------------------------+ Dist forearm         0.15        0.27                                    +-----------------+-------------+----------+--------------------------------+ Wrist                                     not  visualized and movement    +-----------------+-------------+----------+--------------------------------+ +-----------------+-------------+---------+-----------------------------------+ Right Basilic    Diameter (cm)  Depth               Findings                                               (cm)                                       +-----------------+-------------+---------+-----------------------------------+ Prox upper arm       0.32       1.20                                       +-----------------+-------------+---------+-----------------------------------+ Mid upper arm        0.26       1.28                                       +-----------------+-------------+---------+-----------------------------------+ Dist upper arm       0.45       1.43                                       +-----------------+-------------+---------+-----------------------------------+  Antecubital fossa    0.35       1.07                                       +-----------------+-------------+---------+-----------------------------------+ Prox forearm         0.23       0.41                                       +-----------------+-------------+---------+-----------------------------------+ Mid forearm                                not visualized and bandages     +-----------------+-------------+---------+-----------------------------------+ Distal forearm                                 not visualized and                                                          bandages/movement          +-----------------+-------------+---------+-----------------------------------+ Wrist                                      not visualized and movement     +-----------------+-------------+---------+-----------------------------------+ +-----------------+-------------+----------+---------------------------+ Left Cephalic    Diameter (cm)Depth (cm)         Findings            +-----------------+-------------+----------+---------------------------+ Prox upper arm       0.28        0.49                               +-----------------+-------------+----------+---------------------------+ Mid upper arm        0.24        0.52                               +-----------------+-------------+----------+---------------------------+ Dist upper arm       0.28        0.43                               +-----------------+-------------+----------+---------------------------+ Antecubital fossa                       not visualized and bandages +-----------------+-------------+----------+---------------------------+ Prox forearm       0.21/0.20  0.49/0.68          branching          +-----------------+-------------+----------+---------------------------+ Mid forearm          0.19        0.47                               +-----------------+-------------+----------+---------------------------+ Dist forearm  0.18        0.54                               +-----------------+-------------+----------+---------------------------+ +-----------------+-------------+----------+-----------------------------------+ Left Basilic     Diameter (cm)Depth (cm)             Findings               +-----------------+-------------+----------+-----------------------------------+ Prox upper arm       0.35        1.20                 origin                +-----------------+-------------+----------+-----------------------------------+ Mid upper arm        0.31        1.34                                       +-----------------+-------------+----------+-----------------------------------+ Dist upper arm       0.37        1.00                                       +-----------------+-------------+----------+-----------------------------------+ Antecubital fossa                               not visualized and                                                         movement/grabbing tech        +-----------------+-------------+----------+-----------------------------------+ Prox forearm                                    not visualized and                                                        movement/grabbing tech        +-----------------+-------------+----------+-----------------------------------+ Mid forearm                                     not visualized and                                                        movement/grabbing tech        +-----------------+-------------+----------+-----------------------------------+ Distal forearm                                  not visualized and  movement/grabbing tech        +-----------------+-------------+----------+-----------------------------------+ Wrist                                           not visualized and                                                        movement/grabbing tech        +-----------------+-------------+----------+-----------------------------------+ *See table(s) above for measurements and observations.  Diagnosing physician: Sherald Hess MD Electronically signed by Sherald Hess MD on 12/08/2023 at 3:22:28 PM.    Final    CT Angio Chest/Abd/Pel for Dissection W and/or Wo Contrast Result Date: 12/07/2023 CLINICAL DATA:  Acute aortic syndrome, peripheral vascular disease status post vascular bypass grafting, incisional drainage EXAM: CT ANGIOGRAPHY CHEST, ABDOMEN AND PELVIS TECHNIQUE: Non-contrast CT of the chest was initially obtained. Multidetector CT imaging through the chest, abdomen and pelvis was performed using the standard protocol during bolus administration of intravenous contrast. Multiplanar reconstructed images and MIPs were obtained and reviewed to evaluate the vascular anatomy. RADIATION DOSE REDUCTION: This exam was performed according to the departmental  dose-optimization program which includes automated exposure control, adjustment of the mA and/or kV according to patient size and/or use of iterative reconstruction technique. CONTRAST:  80mL OMNIPAQUE IOHEXOL 350 MG/ML SOLN COMPARISON:  None Available. FINDINGS: CTA CHEST FINDINGS Cardiovascular: Extensive multi-vessel coronary artery calcification. Global cardiac size within normal limits. No pericardial effusion. Central pulmonary arteries are of normal caliber. Mild atherosclerotic calcification within the thoracic aorta. No aortic aneurysm. Right axillofemoral bypass grafting has been performed with wide patency of the bypass graft. Mild perigraft infiltration and trace fluid is nonspecific and may be postsurgical in nature. The distal anastomosis of the graft in folds a patent right femoral-distal bypass graft which is incompletely visualized on this examination. The visualized portion the graft is widely patent as is the distal anastomosis. There focal dissection at the femoral anastomosis, best seen on sagittal image # 49/11, above the level of the profundus femoral artery origin. The visualized profundus femoral artery is widely patent. Mediastinum/Nodes: No enlarged mediastinal, hilar, or axillary lymph nodes. Thyroid gland, trachea, and esophagus demonstrate no significant findings. Lungs/Pleura: Minimal biapical scarring. Lungs are otherwise clear. No pneumothorax or pleural effusion. No central obstructing lesion. Musculoskeletal: No chest wall abnormality. No acute or significant osseous findings. Review of the MIP images confirms the above findings. CTA ABDOMEN AND PELVIS FINDINGS VASCULAR Aorta: Moderate atherosclerotic calcification. No aneurysm or dissection. No periaortic infiltration or fluid collections identified. 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 Distally widely patent without aneurysm or dissection. SMA: No hemodynamically significant stenosis. No dissection or  aneurysm. Renals: Less than 50% stenosis of the right renal artery proximally. Wide patency of the left renal artery. Normal vascular morphology. No aneurysm or dissection. IMA: Patent without evidence of aneurysm, dissection, vasculitis or significant stenosis. Inflow: Extensive mixed atherosclerotic plaque with chronic occlusion of the right external iliac artery. Internal iliac arteries are diseased but patent at their origins. Left lower extremity arterial inflow is diseased but without evidence of focal hemodynamically significant stenosis. Surgical changes of left femoral distal bypass grafting with wide patency of the bypass graft is  identified. Veins: No obvious venous abnormality within the limitations of this arterial phase study. Review of the MIP images confirms the above findings. NON-VASCULAR Hepatobiliary: No focal liver abnormality is seen. Status post cholecystectomy. No biliary dilatation. 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 Normal enhancement the residual body and tail the pancreas. No pancreatic ductal dilation. No peripancreatic fluid collections. Spleen: Unremarkable Adrenals/Urinary Tract: Adrenal glands are unremarkable. Kidneys are normal, without renal calculi, focal lesion, or hydronephrosis. Bladder is unremarkable. Stomach/Bowel: Stomach is within normal limits. Appendix is not visualized and is likely absent. No evidence of bowel wall thickening, distention, or inflammatory changes. Lymphatic: There is shotty right inguinal adenopathy, nonspecific, possibly reactive in nature. Reproductive: Uterus and bilateral adnexa are unremarkable. Other: Asymmetric subcutaneous edema within the right chest abdominal wall may be postsurgical in nature. Musculoskeletal: Osseous structures are age-appropriate. No acute bone abnormality. No lytic or blastic bone lesion. Review of the MIP images confirms the above findings. IMPRESSION: 1. Surgical changes of right  axillofemoral bypass grafting with wide patency of the bypass graft. Mild perigraft infiltration and trace fluid is nonspecific and may be postsurgical in nature. 2. Incomplete visualization of the right femoral-distal bypass graft. The visualized portion of the graft is widely patent. Focal dissection at the femoral anastomosis, above the level of the profundus femoral artery origin. The visualized profundus femoral artery is widely patent. 3. Surgical changes of left femoral distal bypass grafting with wide patency of the visualize bypass graft. 4. Extensive multi-vessel coronary artery calcification. 5. 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 This is of questionable clinical significance given patency of the additional mesenteric vasculature. 6. 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 Aortic Atherosclerosis (ICD10-I70.0). Electronically Signed   By: Helyn Numbers M.D.   On: 12/07/2023 23:07   DG Chest 2 View Result Date: 12/07/2023 CLINICAL DATA:  Recent vascular surgery, drainage from incision, concern for sepsis EXAM: CHEST - 2 VIEW COMPARISON:  09/01/2019 FINDINGS: Frontal and lateral views of the chest demonstrate an unremarkable cardiac silhouette. No acute airspace disease, effusion, or pneumothorax. No acute bony abnormalities. IMPRESSION: 1. No acute intrathoracic process. Electronically Signed   By: Sharlet Salina M.D.   On: 12/07/2023 20:39      LOS: 2 days    Jacquelin Hawking, MD [facility] 12/09/2023, 8:10 AM   If 7PM-7AM, please contact night-coverage [email or web address]

## 2023-12-09 NOTE — Progress Notes (Signed)
[facility]  Subjective  -persistent drainage from right groin   Objective 121/66 66 98.5 F (36.9 C) (Oral) 18 100%  Intake/Output Summary (Last 24 hours) at 12/09/2023 0742 Last data filed at 12/09/2023 0626 Gross per 24 hour  Intake 1790.61 ml  Output 550 ml  Net 1240.61 ml    Purulent drainage from right groin Bilateral DP pulses palpable  Laboratory Lab Results: Recent Labs    12/08/23 0529 12/09/23 0307  WBC 17.4* 13.9*  HGB 10.0* 10.3*  HCT 29.7* 30.7*  PLT 177 172   BMET Recent Labs    12/08/23 0529 12/09/23 0307  NA 136 134*  K 3.2* 2.7*  CL 107 106  CO2 22 21*  GLUCOSE 118* 92  BUN 11 10  CREATININE 0.93 0.81  CALCIUM 7.1* 7.1*    COAG Lab Results  Component Value Date   INR 1.3 (H) 12/07/2023   INR 0.9 10/23/2023   INR 0.9 04/20/2023   No results found for: "PTT"  Assessment/Planning:  78 year old female admitted with infected right axillofemoral placed on 11/05/2023 by Dr. Lenell Antu.  Discussed plan for excision of right axillofemoral bypass with likely vein patch of the axillary and femoral artery.  May require harvest of arm vein as both of her leg veins have been harvested for bypass in the past.  Possible muscle flap in the right groin.  Her blood cultures are now positive for MRSA.  Discussed risk of ongoing wound healing problems and worsening ischemia of the right leg.  Appears this was done for claudication so I hope at best she is back to claudicating.  All questions answered.  Cephus Shelling 12/09/2023 7:42 AM --

## 2023-12-09 NOTE — Anesthesia Procedure Notes (Signed)
Arterial Line Insertion Start/End1/19/2025 8:15 AM, 12/09/2023 8:20 AM Performed by: Dairl Ponder, CRNA, CRNA  Patient location: OR. Preanesthetic checklist: patient identified, IV checked, site marked, risks and benefits discussed, surgical consent, monitors and equipment checked, pre-op evaluation, timeout performed and anesthesia consent Patient sedated Right, radial was placed  Attempts: 1 Procedure performed without using ultrasound guided technique. Following insertion, dressing applied and Biopatch. Post procedure assessment: normal  Patient tolerated the procedure well with no immediate complications.

## 2023-12-09 NOTE — Plan of Care (Signed)
  Problem: Education: Goal: Ability to describe self-care measures that may prevent or decrease complications (Diabetes Survival Skills Education) will improve Outcome: Not Progressing Goal: Individualized Educational Video(s) Outcome: Not Progressing   Problem: Coping: Goal: Ability to adjust to condition or change in health will improve Outcome: Not Progressing   Problem: Fluid Volume: Goal: Ability to maintain a balanced intake and output will improve Outcome: Not Progressing   Problem: Health Behavior/Discharge Planning: Goal: Ability to identify and utilize available resources and services will improve Outcome: Not Progressing Goal: Ability to manage health-related needs will improve Outcome: Not Progressing   Problem: Metabolic: Goal: Ability to maintain appropriate glucose levels will improve Outcome: Not Progressing   Problem: Nutritional: Goal: Maintenance of adequate nutrition will improve Outcome: Not Progressing Goal: Progress toward achieving an optimal weight will improve Outcome: Not Progressing   Problem: Skin Integrity: Goal: Risk for impaired skin integrity will decrease Outcome: Not Progressing   Problem: Tissue Perfusion: Goal: Adequacy of tissue perfusion will improve Outcome: Not Progressing   Problem: Fluid Volume: Goal: Hemodynamic stability will improve Outcome: Not Progressing   Problem: Clinical Measurements: Goal: Diagnostic test results will improve Outcome: Not Progressing Goal: Signs and symptoms of infection will decrease Outcome: Not Progressing   Problem: Respiratory: Goal: Ability to maintain adequate ventilation will improve Outcome: Not Progressing   Problem: Education: Goal: Knowledge of General Education information will improve Description: Including pain rating scale, medication(s)/side effects and non-pharmacologic comfort measures Outcome: Not Progressing   Problem: Health Behavior/Discharge Planning: Goal: Ability  to manage health-related needs will improve Outcome: Not Progressing   Problem: Clinical Measurements: Goal: Ability to maintain clinical measurements within normal limits will improve Outcome: Not Progressing Goal: Will remain free from infection Outcome: Not Progressing Goal: Diagnostic test results will improve Outcome: Not Progressing Goal: Respiratory complications will improve Outcome: Not Progressing Goal: Cardiovascular complication will be avoided Outcome: Not Progressing   Problem: Activity: Goal: Risk for activity intolerance will decrease Outcome: Not Progressing   Problem: Nutrition: Goal: Adequate nutrition will be maintained Outcome: Not Progressing   Problem: Coping: Goal: Level of anxiety will decrease Outcome: Not Progressing   Problem: Elimination: Goal: Will not experience complications related to bowel motility Outcome: Not Progressing Goal: Will not experience complications related to urinary retention Outcome: Not Progressing   Problem: Pain Managment: Goal: General experience of comfort will improve and/or be controlled Outcome: Not Progressing   Problem: Safety: Goal: Ability to remain free from injury will improve Outcome: Not Progressing   Problem: Skin Integrity: Goal: Risk for impaired skin integrity will decrease Outcome: Not Progressing

## 2023-12-09 NOTE — Hospital Course (Addendum)
Jill Higgins is a 78 y.o. female with a history of hypertension, diabetes mellitus type 2, depression, anxiety, PAD with thrombosis of right extremity iliac/common femoral and disabling claudication status post axillofemoral bypass graft.  Patient presented secondary to discharge from groin incision with associated chills concerning for evidence of infection.  Patient met sepsis criteria on admission to start empiric antibiotics.  Blood cultures obtained prior to antibiotics.  Vascular surgery consulted with plan for surgical management.   **Interim History She is found to have MRSA bacteremia and ID has been consulted and has been changed to IV Dapto.  She underwent surgical intervention for her right axillofemoral bypass graft infection.  PT OT recommending SNF and was assisting with disposition now that PICC line is in and she appears medically stable.  Assessment and Plan:  Severe Sepsis -Present on admission with associated lactic acidosis.  -Secondary to graft infection.  -Blood cultures (1/17) obtained on admission and are significant for MRSA.  -Repeat CX from 1/19 showing NGTD at 5 Days -Empiric Vancomycin and Cefepime started and transitioned to Vancomycin monotherapy and now Daptomycin.  -ID consulted and appreciate evaluation recommendations and have signed off the case   Right Axillo-Femoral Bypass Graft Infection -Present on admission. Surgery performed on 12/26.  -Vascular surgery consulted and are following.  -WBC Trend had improved but slightly trended up: Recent Labs  Lab 12/10/23 0831 12/11/23 0335 12/12/23 0324 12/13/23 1014 12/14/23 0929 12/15/23 0500 12/16/23 0307  WBC 11.7* 7.9 9.4 12.1* 9.1 7.7 8.1  -Cefepime discontinued. Patient underwent removal of bypass on 1/19; culture significant for GPCs -Vancomycin changed to IV Daptomycin by ID -Follow-up graft culture data and it shows:  ABUNDANT METHICILLIN RESISTANT STAPHYLOCOCCUS AUREUS NO ANAEROBES ISOLATED;  CULTURE IN PROGRESS FOR 5 DAYS   Report Status PENDING  Organism ID, Bacteria METHICILLIN RESISTANT STAPHYLOCOCCUS AUREUS  Resulting Agency [facility]     Susceptibility    Methicillin resistant staphylococcus aureus    MIC    CIPROFLOXACIN >=8 RESISTANT Resistant    CLINDAMYCIN <=0.25 SENS... Sensitive    ERYTHROMYCIN <=0.25 SENS... Sensitive    GENTAMICIN <=0.5 SENSI... Sensitive    Inducible Clindamycin NEGATIVE Sensitive    LINEZOLID 2 SENSITIVE Sensitive    OXACILLIN >=4 RESISTANT Resistant    RIFAMPIN <=0.5 SENSI... Sensitive    TETRACYCLINE <=1 SENSITIVE Sensitive    TRIMETH/SULFA <=10 SENSIT... Sensitive    VANCOMYCIN <=0.5 SENSI... Sensitive     -Right forearm and Hand edematous will check RUE Duplex to Evaluate for DVT -Next VAC changes on Monday and TOC is involved as the PT evaluation has now changed the recommendation for SNF   MRSA Bacteremia -Source is likely right groin wound. ID consulted -Patient was managed on Vancomycin. Repeat blood cultures (1/19) obtained and are no growth to date. Transthoracic Echocardiogram significant for no evidence of valvular vegetations. -ID recommendations: Vancomycin now changed to Daptomycin on 12/11/23 (plan for 4-6 weeks), -Cardiology consulted for Transesophageal Echocardiogram which was recommended by ID and this was done today and showed now LA/LAA Thrombus or masses with EF of 60-65% -Since her blood cultures are negative she required a IJ tunneled PICC line given her anatomy.  This is now in place and she appears medically stable for discharge needs to select a SNF and have insurance authorization  Right Arm Swelling -Upper extremity duplex done and showed an SVT -U/S formal read showed "No evidence of deep vein thrombosis in the upper extremity. Findings consistent with acute superficial vein thrombosis involving  the right cephalic vein. Occlusive Acute SVT seen in the Cephalic Vein in the proximal upper arm." -C/w  Supportive Care   PAD -S/p axillo-femoral bypass graft.  -Patient follows with vascular surgery as an outpatient.  -LDL is down to 15. -Continue Atorvastatin 80 mg po Daily, Aspirin 81 mg po qHS and Clopidogrel 75 mg po Daily    Primary Hypertension -Patient is on Amlodipine, Losartan and Toprol Xl as an outpatient. -Antihypertensives held on admission secondary to hypotension. -Continue to Monitor BP per Protocol -Last BP reading was 136/58   Diabetes Mellitus, Type 2 -Uncontrolled with hyperglycemia.  -Hold home Metformin 500 mg po BID -Last hemoglobin A1C of 9.5%.  -Patient is managed on insulin glargine, insulin aspart, and metformin as an outpatient and started on SSI on admission. Blood sugar low-normal initially, now worsening. -Continue Sensitive Novolog SSI AC and  Semglee 5 units daily -CBG Trend: Recent Labs  Lab 12/15/23 1239 12/15/23 1310 12/15/23 1606 12/15/23 2106 12/15/23 2143 12/16/23 0607 12/16/23 1240  GLUCAP 76 82 128* 51* 100* 141* 193*    Depression and Anxiety -Continue Duloxetine 60 mg po Daily  Metabolic Acidosis -Mild and improved. CO2 is now 24, AG is 8 , Chloride Level is now 106 -Continue to Monitor and Trend and repeat CMP in the AM   Insomnia -Continue Temazepam 30 mg po qHSprn Sleep  Hypokalemia -Patient's K+ Level Trend: Recent Labs  Lab 12/10/23 0355 12/11/23 0335 12/12/23 0324 12/13/23 1014 12/14/23 0929 12/15/23 0500 12/16/23 0307  K 3.7 3.2* 3.5 4.0 3.8 4.0 4.1  -Continue to Monitor and Replete as Necessary -Repeat CMP in the AM   Hypomagnesemia -Patient's Mag Level Trend: Recent Labs  Lab 12/08/23 0529 12/10/23 0355 12/12/23 0324 12/13/23 1014 12/14/23 0929 12/15/23 0500 12/16/23 0307  MG 1.0* 2.0 1.5* 1.6* 1.8 1.5* 2.6*  -Continue to Monitor and Replete as Necessary -Repeat Mag in the AM   ABLA and Perioperative Blood loss superimposed on Normocytic Anemia, now stable -Secondary to vascular surgery. Patient  with hemoglobin down to 7.1 g/dL requiring a transfusion of 2 units of PRBC. Posttransfusion hemoglobin now stable -Hgb/Hct Trend: Recent Labs  Lab 12/10/23 0831 12/11/23 0335 12/12/23 0324 12/13/23 1014 12/14/23 0929 12/15/23 0500 12/16/23 0307  HGB 10.0* 8.6* 8.2* 10.1* 9.8* 9.0* 10.1*  HCT 28.4* 24.6* 24.4* 30.3* 29.3* 26.7* 30.4*  MCV 91.6 93.2 94.2 95.6 94.5 93.7 95.9  -Continue to Monitor for S/Sx of Bleeding; No overt bleeding noted -Repeat CBC in the AM   Hypoalbuminemia -Patient's Albumin Trend: Recent Labs  Lab 12/09/23 0307 12/10/23 0355 12/11/23 0335 12/13/23 1014 12/14/23 0929 12/15/23 0500 12/16/23 0307  ALBUMIN 1.7* 1.8* 1.7* 1.8* 1.9* 1.8* 2.0*  -Continue to Monitor and Trend and repeat CMP in the AM   Severe Protein Calorie Malnutrition in the context of Chronic illness Nutrition Status: Nutrition Problem: Severe Malnutrition Etiology: chronic illness (chronic pancreatitits) Signs/Symptoms: severe fat depletion, severe muscle depletion, energy intake < 75% for > or equal to 1 month Interventions: Boost Plus, Magic cup, MVI

## 2023-12-09 NOTE — Op Note (Signed)
Date: December 09, 2023  Preoperative diagnosis: Infected right axillary artery to femoral artery bypass graft  Postoperative diagnosis: Same  Procedure: 1.  Harvest of left arm basilic vein 2.  Redo exposure of right axillary artery 3.  Excision of right axillary artery to femoral artery bypass 4.  Vein patch angioplasty right axillary artery 5.  Vein patch angioplasty of right femoral artery distal bypass 6.  Vac right groin  Surgeon: Dr. Cephus Shelling, MD  Assistant: Doreatha Massed, PA  Indications: 78 year old female admitted Friday night with evidence of purulent drainage from her right groin.  Recent underwent a right axillary to femoral bypass by Dr. Lenell Antu.  Now blood cultures positive for MRSA.  Presents for excision of bypass.  Risks benefits discussed.  Assistant needed given the complexity of the case and also due to significant scar tissue and for vein patch of the artery.  Findings: We reexposed the incision on the right chest wall below the clavicle.  The graft was not incorporated at all.  The axillary artery was severely scarred in and this dissection was very tedious as the adjacent vein and nerve were very scarred to the native axillary artery.  Ultimately we were finally able to get proximal control.  The graft was then removed from the axillary artery and a vein patch was used.  We then reopened the right groin and sent cultures.  The graft was easily excised and was not incorporated.  The right leg bypass was patched in the right groin with vein as well.  A VAC was placed in the right groin after we closed soft tissue over the artery.  15 [place] Blake drain placed in the right chest wall.  Right DP monophasic signal at completion.  Anesthesia: General  Details: Patient was taken to the operating room after informed consent was obtained.  Placed on the operative table in supine position.  General endotracheal anesthesia was induced.  Ultimately the left arm was  prepped for basilic vein harvest.  The chest wall on the right including abdominal wall and groin were then prepped and draped.  Antibiotics were given and timeout performed.  Initially opened the infra clavicular incision on the right chest wall.  This was very scarred in.  We dissected down tediously to the graft.  The graft was not incorporated.  The anastomosis was very scarred in.  We tediously dissected for proximal control of the axillary artery and protected the nerve and vein.  Again this was difficult as she is about 3 weeks postop and this was severely scarred with no identifiable plane.  Ultimately this was a very tedious dissection.  The graft ultimately avulsed from the artery.  We used a #4 fogarty for distal control and had a Henle clamp for proximal control.  At that point time we made a harvest incision in the left arm took the basilic vein.  This was spatulated.  The axillary artery was then patched with vein using 5-0 Prolene parachute technique.  We did give the patient 100 units/kg IV heparin prior to clamping the axillary artery.  We had good hemostasis once we came off clamps and we did flush the artery.  We then opened the right groin incision and sent cultures.  The bypass was taken off femoral bypass in the right groin.  Ultimately we were able to easily excise the bypass graft and this was passed off the field and all of it was removed.  I did send some of the bypass  for culture.  I then used another piece of basilic vein and performed a vein patch of the femoral bypass in the right groin.  This was again done with 5-0 prolene parachute technique using a Satinsky clamp with the help of my assistant.  At that point time we had a signal in the right foot that was monophasic in the DP.  The A-line in the right wrist had flow distally.  We went ahead and closed the right groin and 2-0 Vicryl to get soft tissue over the artery and placed VAC with a black sponge.  The vein harvest incision in  the left arm was closed with 3-0 Vicryl 4-0 Monocryl and Dermabond.  We placed a 15 [place] Blake drain in the right chest wall with Surgicel powder and this ws tunneled through the skin.  Again the repair on the axillary artery looked hemostatic.  We irrigated this out.  The fascia of the chest wall was closed with 2-0 Vicryl the skin was closed with 3-0 Vicryl and staples.  Complication: None  Condition: Stable  Cephus Shelling, MD [facility] Office: [phone]   Cephus Shelling

## 2023-12-09 NOTE — Progress Notes (Signed)
Patient transfer to Short stay rm 5 in bed with monitor on with SIster. Sister has phone and Consulting civil engineer. Rn aware of arrival.And place on heart monitor.

## 2023-12-09 NOTE — Progress Notes (Signed)
0710 received report on patient and that patient was off unit in surgery at this time urine noted in canister emptied and documented waiting on patient to come back from surgery.

## 2023-12-09 NOTE — Anesthesia Procedure Notes (Signed)
Procedure Name: Intubation Date/Time: 12/09/2023 8:19 AM  Performed by: Zollie Beckers, CRNAPre-anesthesia Checklist: Patient identified, Emergency Drugs available, Suction available and Patient being monitored Patient Re-evaluated:Patient Re-evaluated prior to induction Oxygen Delivery Method: Circle system utilized Preoxygenation: Pre-oxygenation with 100% oxygen Induction Type: IV induction Ventilation: Mask ventilation without difficulty Laryngoscope Size: Mac and 3 Grade View: Grade I Tube type: Oral Tube size: 7.0 mm Number of attempts: 1 Airway Equipment and Method: Stylet Placement Confirmation: ETT inserted through vocal cords under direct vision, positive ETCO2 and breath sounds checked- equal and bilateral Secured at: 21 cm Tube secured with: Tape Dental Injury: Teeth and Oropharynx as per pre-operative assessment

## 2023-12-09 NOTE — Anesthesia Postprocedure Evaluation (Signed)
Anesthesia Post Note  Patient: Jill Higgins  Procedure(s) Performed: EXCISION AXILLA-BIFEMORAL RIGHT (Right: Flank) APPLICATION OF WOUND VAC (Right: Groin) BACILIC VEIN HARVEST LEFT ARM (Left: Arm Upper) VEIN  PATCH RIGHT AXILLIA ARTERY RIGHT (Right: Axilla) Vein Patch Right FEMORAL ARTERY (Right: Groin)     Patient location during evaluation: PACU Anesthesia Type: General Level of consciousness: awake and alert Pain management: pain level controlled Vital Signs Assessment: post-procedure vital signs reviewed and stable Respiratory status: spontaneous breathing, nonlabored ventilation, respiratory function stable and patient connected to nasal cannula oxygen Cardiovascular status: blood pressure returned to baseline and stable Postop Assessment: no apparent nausea or vomiting Anesthetic complications: no   No notable events documented.  Last Vitals:  Vitals:   12/09/23 1503 12/09/23 1613  BP:    Pulse: 68   Resp: 14   Temp: 36.9 C 36.5 C  SpO2: 100%     Last Pain:  Vitals:   12/09/23 1613  TempSrc: Oral  PainSc: 0-No pain                 Earl Lites P [place]on Garden

## 2023-12-09 NOTE — Transfer of Care (Signed)
Immediate Anesthesia Transfer of Care Note  Patient: Jill Higgins  Procedure(s) Performed: EXCISION AXILLA-BIFEMORAL RIGHT (Right: Flank) APPLICATION OF WOUND VAC (Right: Groin) BACILIC VEIN HARVEST LEFT ARM (Left: Arm Upper) VEIN  PATCH RIGHT AXILLIA ARTERY RIGHT (Right: Axilla) Vein Patch Right FEMORAL ARTERY (Right: Groin)  Patient Location: PACU  Anesthesia Type:General  Level of Consciousness: awake and alert   Airway & Oxygen Therapy: Patient Spontanous Breathing  Post-op Assessment: Report given to RN and Post -op Vital signs reviewed and stable  Post vital signs: Reviewed and stable  Last Vitals:  Vitals Value Taken Time  BP 152/140 12/09/23 1130  Temp    Pulse 91 12/09/23 1200  Resp 24 12/09/23 1200  SpO2 95 % 12/09/23 1200  Vitals shown include unfiled device data.  Last Pain:  Vitals:   12/09/23 0559  TempSrc: Oral  PainSc:       Patients Stated Pain Goal: 0 (12/08/23 1811)  Complications: No notable events documented.

## 2023-12-09 NOTE — Progress Notes (Signed)
Potassium 2.7. Test page Dr. Loney Loh the results. Tim R.N. awaiting orders.

## 2023-12-09 NOTE — Progress Notes (Signed)
1415 Patient arrived from PACU with PRBC just finishing up. Patient alert x2 on room air wound vac to right groin JP to right upper chest and staples above the JP incision that is sutures in both sites slightly bleeding dressings changes patient noted to have swelling and bruising in that area and at top of should PACU RN states no changes from post surgery. Left upper inner arm also glued with noted to be bruised with swelling again PACU RN states no changes from post surgery.  Foley CDI patient just had PRN pain meds. Pedal pulses to left foot palpable doppler on right. ARTLINE in right radial leveled.   1437 second unit PRBCs started as ordered. Paged attending regarding the missed potassium orders de to current Potassium hold other potassium IVS.   1800 Diet restarted could get a hold of attending or PA why patient was still NPO. Patient alert to self place situation and year at this time. 1825 PRN meds given. Patient agreeable to have linen changed 1825 Food arrived will allow patient to have first meal of the day over changing sheets at this time.

## 2023-12-09 NOTE — Anesthesia Preprocedure Evaluation (Addendum)
Anesthesia Evaluation  Patient identified by MRN, date of birth, ID band Patient awake    Reviewed: Allergy & Precautions, NPO status , Patient's Chart, lab work & pertinent test results  Airway Mallampati: II  TM Distance: >3 FB Neck ROM: Full    Dental no notable dental hx.    Pulmonary sleep apnea , Patient abstained from smoking., former smoker   Pulmonary exam normal        Cardiovascular hypertension, + Peripheral Vascular Disease   Rhythm:Regular Rate:Normal  ECHO 2022:  1. Left ventricular ejection fraction, by estimation, is 50 to 55%. The  left ventricle has low normal function. The left ventricle has no regional  wall motion abnormalities. There is mild concentric left ventricular  hypertrophy. Left ventricular  diastolic parameters are consistent with Grade III diastolic dysfunction  (restrictive).   2. Right ventricular systolic function is normal. The right ventricular  size is normal. There is normal pulmonary artery systolic pressure.   3. There is mild posterior mitral annular calcification. Trivial mitral  valve regurgitation. No evidence of mitral stenosis.   4. The aortic valve is normal in structure. Aortic valve regurgitation is  not visualized. No aortic stenosis is present.   5. The inferior vena cava is normal in size with greater than 50%  respiratory variability, suggesting right atrial pressure of 3 mmHg.     Neuro/Psych   Anxiety Depression       GI/Hepatic Neg liver ROS,GERD  ,,  Endo/Other  diabetes, Type 2    Renal/GU negative Renal ROS  negative genitourinary   Musculoskeletal  (+) Arthritis ,  Fibromyalgia -  Abdominal Normal abdominal exam  (+)   Peds  Hematology Lab Results      Component                Value               Date                      WBC                      13.9 (H)            12/09/2023                HGB                      10.3 (L)            12/09/2023                 HCT                      30.7 (L)            12/09/2023                MCV                      93.0                12/09/2023                PLT                      172                 12/09/2023  Lab Results      Component                Value               Date                      NA                       134 (L)             12/09/2023                K                        2.7 (LL)            12/09/2023                CO2                      21 (L)              12/09/2023                GLUCOSE                  92                  12/09/2023                BUN                      10                  12/09/2023                CREATININE               0.81                12/09/2023                CALCIUM                  7.1 (L)             12/09/2023                GFRNONAA                 >60                 12/09/2023              Anesthesia Other Findings   Reproductive/Obstetrics                             Anesthesia Physical Anesthesia Plan  ASA: 3  Anesthesia Plan: General   Post-op Pain Management:    Induction: Intravenous  PONV Risk Score and Plan: 3 and Ondansetron, Dexamethasone and Treatment may vary due to age or medical condition  Airway Management Planned: Mask and Oral ETT  Additional Equipment: Arterial line  Intra-op Plan:   Post-operative Plan: Extubation in OR  Informed Consent: I have reviewed the patients History and Physical, chart, labs and discussed the procedure including the risks, benefits and alternatives for the proposed anesthesia with the patient or authorized  representative who has indicated his/her understanding and acceptance.     Dental advisory given  Plan Discussed with: CRNA  Anesthesia Plan Comments:        Anesthesia Quick Evaluation

## 2023-12-10 ENCOUNTER — Other Ambulatory Visit (HOSPITAL_COMMUNITY): Payer: Self-pay

## 2023-12-10 ENCOUNTER — Encounter (HOSPITAL_COMMUNITY): Payer: Self-pay | Admitting: Vascular Surgery

## 2023-12-10 ENCOUNTER — Other Ambulatory Visit: Payer: Self-pay

## 2023-12-10 ENCOUNTER — Inpatient Hospital Stay (HOSPITAL_COMMUNITY): Payer: Medicare Other

## 2023-12-10 DIAGNOSIS — G8918 Other acute postprocedural pain: Secondary | ICD-10-CM

## 2023-12-10 DIAGNOSIS — T827XXA Infection and inflammatory reaction due to other cardiac and vascular devices, implants and grafts, initial encounter: Secondary | ICD-10-CM | POA: Diagnosis not present

## 2023-12-10 DIAGNOSIS — B9562 Methicillin resistant Staphylococcus aureus infection as the cause of diseases classified elsewhere: Secondary | ICD-10-CM | POA: Diagnosis not present

## 2023-12-10 DIAGNOSIS — R7881 Bacteremia: Secondary | ICD-10-CM

## 2023-12-10 DIAGNOSIS — T148XXA Other injury of unspecified body region, initial encounter: Secondary | ICD-10-CM

## 2023-12-10 DIAGNOSIS — L089 Local infection of the skin and subcutaneous tissue, unspecified: Secondary | ICD-10-CM

## 2023-12-10 LAB — LIPID PANEL
Cholesterol: 48 mg/dL (ref 0–200)
HDL: 20 mg/dL — ABNORMAL LOW (ref >40)
LDL Cholesterol: 15 mg/dL (ref 0–99)
Total CHOL/HDL Ratio: 2.4 {ratio}
Triglycerides: 65 mg/dL (ref <150)
VLDL: 13 mg/dL (ref 0–40)

## 2023-12-10 LAB — TYPE AND SCREEN
ABO/RH(D): A POS
Antibody Screen: NEGATIVE
Unit division: 0
Unit division: 0

## 2023-12-10 LAB — COMPREHENSIVE METABOLIC PANEL
ALT: 15 U/L (ref 0–44)
AST: 20 U/L (ref 15–41)
Albumin: 1.8 g/dL — ABNORMAL LOW (ref 3.5–5.0)
Alkaline Phosphatase: 38 U/L (ref 38–126)
Anion gap: 6 (ref 5–15)
BUN: 7 mg/dL — ABNORMAL LOW (ref 8–23)
CO2: 24 mmol/L (ref 22–32)
Calcium: 7.4 mg/dL — ABNORMAL LOW (ref 8.9–10.3)
Chloride: 107 mmol/L (ref 98–111)
Creatinine, Ser: 0.71 mg/dL (ref 0.44–1.00)
GFR, Estimated: >60 mL/min (ref >60)
Glucose, Bld: 117 mg/dL — ABNORMAL HIGH (ref 70–99)
Potassium: 3.7 mmol/L (ref 3.5–5.1)
Sodium: 137 mmol/L (ref 135–145)
Total Bilirubin: 0.8 mg/dL (ref 0.0–1.2)
Total Protein: 3.7 g/dL — ABNORMAL LOW (ref 6.5–8.1)

## 2023-12-10 LAB — CBC
HCT: 28.4 % — ABNORMAL LOW (ref 36.0–46.0)
Hemoglobin: 10 g/dL — ABNORMAL LOW (ref 12.0–15.0)
MCH: 32.3 pg (ref 26.0–34.0)
MCHC: 35.2 g/dL (ref 30.0–36.0)
MCV: 91.6 fL (ref 80.0–100.0)
Platelets: 108 10*3/uL — ABNORMAL LOW (ref 150–400)
RBC: 3.1 MIL/uL — ABNORMAL LOW (ref 3.87–5.11)
RDW: 14.4 % (ref 11.5–15.5)
WBC: 11.7 10*3/uL — ABNORMAL HIGH (ref 4.0–10.5)
nRBC: 0 % (ref 0.0–0.2)

## 2023-12-10 LAB — BPAM RBC
Blood Product Expiration Date: 202502122359
Blood Product Expiration Date: 202502122359
ISSUE DATE / TIME: 202501191152
ISSUE DATE / TIME: 202501191430
Unit Type and Rh: 6200
Unit Type and Rh: 6200

## 2023-12-10 LAB — GLUCOSE, CAPILLARY
Glucose-Capillary: 119 mg/dL — ABNORMAL HIGH (ref 70–99)
Glucose-Capillary: 134 mg/dL — ABNORMAL HIGH (ref 70–99)
Glucose-Capillary: 153 mg/dL — ABNORMAL HIGH (ref 70–99)
Glucose-Capillary: 171 mg/dL — ABNORMAL HIGH (ref 70–99)
Glucose-Capillary: 214 mg/dL — ABNORMAL HIGH (ref 70–99)
Glucose-Capillary: 240 mg/dL — ABNORMAL HIGH (ref 70–99)

## 2023-12-10 LAB — ECHOCARDIOGRAM COMPLETE
AR max vel: 1.32 cm2
AV Area VTI: 1.34 cm2
AV Area mean vel: 1.44 cm2
AV Mean grad: 7 mm[Hg]
AV Peak grad: 13.5 mm[Hg]
Ao pk vel: 1.84 m/s
Area-P 1/2: 3.72 cm2
Height: 62.992 in
S' Lateral: 3 cm
Weight: 1763.68 [oz_av]

## 2023-12-10 LAB — MAGNESIUM: Magnesium: 2 mg/dL (ref 1.7–2.4)

## 2023-12-10 MED ORDER — ADULT MULTIVITAMIN W/MINERALS CH
1.0000 | ORAL_TABLET | Freq: Every day | ORAL | Status: DC
  Administered 2023-12-10 – 2023-12-19 (×9): 1 via ORAL
  Filled 2023-12-10 (×10): qty 1

## 2023-12-10 MED ORDER — BOOST PLUS PO LIQD
237.0000 mL | Freq: Two times a day (BID) | ORAL | Status: DC
  Administered 2023-12-10 – 2023-12-18 (×12): 237 mL via ORAL
  Filled 2023-12-10 (×20): qty 237

## 2023-12-10 MED ORDER — SODIUM CHLORIDE 0.9 % IV SOLN
8.0000 mg/kg | Freq: Every day | INTRAVENOUS | Status: DC
  Administered 2023-12-10: 400 mg via INTRAVENOUS
  Filled 2023-12-10 (×2): qty 8

## 2023-12-10 MED ORDER — INSULIN ASPART 100 UNIT/ML IJ SOLN
0.0000 [IU] | Freq: Three times a day (TID) | INTRAMUSCULAR | Status: DC
Start: 2023-12-10 — End: 2023-12-19
  Administered 2023-12-10: 3 [IU] via SUBCUTANEOUS
  Administered 2023-12-10: 2 [IU] via SUBCUTANEOUS
  Administered 2023-12-11: 3 [IU] via SUBCUTANEOUS
  Administered 2023-12-11: 2 [IU] via SUBCUTANEOUS
  Administered 2023-12-11: 3 [IU] via SUBCUTANEOUS
  Administered 2023-12-12 (×2): 1 [IU] via SUBCUTANEOUS
  Administered 2023-12-13 – 2023-12-14 (×2): 2 [IU] via SUBCUTANEOUS
  Administered 2023-12-15 (×2): 1 [IU] via SUBCUTANEOUS
  Administered 2023-12-16: 3 [IU] via SUBCUTANEOUS
  Administered 2023-12-17 (×2): 2 [IU] via SUBCUTANEOUS
  Administered 2023-12-17: 1 [IU] via SUBCUTANEOUS
  Administered 2023-12-18: 5 [IU] via SUBCUTANEOUS
  Administered 2023-12-18: 2 [IU] via SUBCUTANEOUS
  Administered 2023-12-19: 1 [IU] via SUBCUTANEOUS
  Administered 2023-12-19: 3 [IU] via SUBCUTANEOUS

## 2023-12-10 NOTE — Evaluation (Signed)
Occupational Therapy Evaluation Patient Details Name: Jill Higgins MRN: [id number] DOB: 1945/12/27 Today's Date: 12/10/2023   History of Present Illness The pt is a 78 yo female presenting 1/17 with drainage from R groin incision (recent R axillary - femoral bypass on 12/26). Now s/p redo R axilla - femoral artery bypass with wound vac placement 1/19. PMH includes: ight common femoral endarterectomy with right femoral to above-knee popliteal bypass, stenting of the right iliac and common femoral arteries, bronchitis, bulging disc, depression w/anxiety, DM II, fibromyalgia, GERD, glaucoma, hyperlipidemia, HTN, PVD, and sleep apnea.   Clinical Impression   Pt presents with decline in function and safety with ADLs and ADL mobility with impaired strength, balance and endurance; pt limited by pain in R arm and groin today but agreeable to activity. PTA pt lived alone  and was Ind with ADLs/selfcare, home mgt and mobility without assistance or use of DME. She reports she had been managing home alone without falls or need for assistance since her surgery in December. Pt currently requires mod A with LB ADLs, min A with toileting and mod A with standing/transfers. Pt would benefit from acute OT services to address impairments to maximize level of function and safety     If plan is discharge home, recommend the following: A little help with bathing/dressing/bathroom;A little help with walking and/or transfers;Assistance with cooking/housework;Assist for transportation;Help with stairs or ramp for entrance    Functional Status Assessment  Patient has had a recent decline in their functional status and demonstrates the ability to make significant improvements in function in a reasonable and predictable amount of time.  Equipment Recommendations  None recommended by OT    Recommendations for Other Services       Precautions / Restrictions Precautions Precautions: Fall Restrictions Weight Bearing  Restrictions Per Provider Order: No      Mobility Bed Mobility Overal bed mobility: Needs Assistance Bed Mobility: Sit to Supine       Sit to supine: Min assist   General bed mobility comments: min A with LE mgt due to pain; increased time    Transfers Overall transfer level: Needs assistance Equipment used: 1 person hand held assist Transfers: Sit to/from Stand, Bed to chair/wheelchair/BSC Sit to Stand: Mod assist     Step pivot transfers: Mod assist            Balance Overall balance assessment: Needs assistance Sitting-balance support: No upper extremity supported, Feet supported Sitting balance-Leahy Scale: Good     Standing balance support: During functional activity, Single extremity supported Standing balance-Leahy Scale: Poor                             ADL either performed or assessed with clinical judgement   ADL Overall ADL's : Needs assistance/impaired Eating/Feeding: Independent;Sitting   Grooming: Wash/dry hands;Wash/dry face;Contact guard assist;Sitting   Upper Body Bathing: Contact guard assist;Sitting   Lower Body Bathing: Moderate assistance   Upper Body Dressing : Contact guard assist;Sitting   Lower Body Dressing: Moderate assistance   Toilet Transfer: Moderate assistance;BSC/3in1   Toileting- Clothing Manipulation and Hygiene: Sit to/from stand;Minimal assistance       Functional mobility during ADLs: Moderate assistance General ADL Comments: slow, guarded pace of speed, limited by pain     Vision Baseline Vision/History: 1 Wears glasses Ability to See in Adequate Light: 0 Adequate Patient Visual Report: No change from baseline       Perception  Praxis         Pertinent Vitals/Pain Pain Assessment Pain Assessment: Faces Faces Pain Scale: Hurts little more Pain Location: R arm and groin Pain Descriptors / Indicators: Discomfort, Grimacing, Sore Pain Intervention(s): Limited activity within  patient's tolerance, Premedicated before session, Monitored during session, Repositioned     Extremity/Trunk Assessment Upper Extremity Assessment Upper Extremity Assessment: Generalized weakness   Lower Extremity Assessment Lower Extremity Assessment: Defer to PT evaluation   Cervical / Trunk Assessment Cervical / Trunk Assessment: Normal   Communication Communication Communication: No apparent difficulties Cueing Techniques: Verbal cues   Cognition Arousal: Alert Behavior During Therapy: WFL for tasks assessed/performed Overall Cognitive Status: Within Functional Limits for tasks assessed                                       General Comments       Exercises     Shoulder Instructions      Home Living Family/patient expects to be discharged to:: Private residence Living Arrangements: Alone Available Help at Discharge: Family;Available PRN/intermittently Type of Home: Apartment Home Access: Level entry     Home Layout: One level     Bathroom Shower/Tub: Chief Strategy Officer: Handicapped height     Home Equipment: Agricultural consultant (2 wheels);Tub bench;Grab bars - tub/shower;Cane - single point;BSC/3in1;Rollator (4 wheels)          Prior Functioning/Environment Prior Level of Function : Independent/Modified Independent;Driving             Mobility Comments: Pt states that occasionally she uses the cane if her pain is really severe, returned home not using DME after surery in dec. no falls ADLs Comments: Ind with ADLs/selfcare, IADLs, cooking        OT Problem List: Decreased activity tolerance;Impaired balance (sitting and/or standing);Pain      OT Treatment/Interventions:      OT Goals(Current goals can be found in the care plan section) Acute Rehab OT Goals Patient Stated Goal: go home OT Goal Formulation: With patient Time For Goal Achievement: 12/24/23 Potential to Achieve Goals: Good ADL Goals Pt Will Perform  Grooming: with supervision;with set-up;sitting Pt Will Perform Upper Body Bathing: with supervision;with set-up;sitting Pt Will Perform Lower Body Bathing: with min assist;with contact guard assist;sit to/from stand Pt Will Perform Upper Body Dressing: with supervision;with set-up;sitting Pt Will Perform Lower Body Dressing: with min assist;with contact guard assist;sit to/from stand Pt Will Transfer to Toilet: with min assist;with contact guard assist;ambulating Pt Will Perform Toileting - Clothing Manipulation and hygiene: with contact guard assist;with supervision;sit to/from stand  OT Frequency:      Co-evaluation              AM-PAC OT "6 Clicks" Daily Activity     Outcome Measure Help from another person eating meals?: None Help from another person taking care of personal grooming?: A Little Help from another person toileting, which includes using toliet, bedpan, or urinal?: A Little Help from another person bathing (including washing, rinsing, drying)?: A Lot Help from another person to put on and taking off regular upper body clothing?: A Little Help from another person to put on and taking off regular lower body clothing?: A Lot 6 Click Score: 17   End of Session Equipment Utilized During Treatment: Gait belt  Activity Tolerance: Patient limited by pain;Patient limited by fatigue Patient left: in bed;with call bell/phone within reach;with bed  alarm set  OT Visit Diagnosis: Pain;Unsteadiness on feet (R26.81);Other abnormalities of gait and mobility (R26.89);Muscle weakness (generalized) (M62.81) Pain - Right/Left: Right Pain - part of body: Arm (groin)                Time: 4098-1191 OT Time Calculation (min): 25 min Charges:  OT General Charges $OT Visit: 1 Visit OT Evaluation $OT Eval Moderate Complexity: 1 Mod OT Treatments $Therapeutic Activity: 8-22 mins    Galen Manila 12/10/2023, 1:45 PM

## 2023-12-10 NOTE — Progress Notes (Addendum)
  Progress Note    12/10/2023 8:26 AM 1 Day Post-Op  Subjective:  no complaints this morning.  Denies rest pain R foot.   Vitals:   12/10/23 0350 12/10/23 0355  BP: (!) 103/52   Pulse: (!) 54   Resp: 16   Temp: 97.6 F (36.4 C)   SpO2: 98% 100%   Physical Exam: Lungs:  non labored Incisions:  dressings left in place R chest. JP recently emptied; R groin vac with good seal; L arm vein harvest incision c/d/i Extremities:  brisk R DP by doppler; palpable L DP Abdomen: soft Neurologic: A&O  CBC    Component Value Date/Time   WBC 13.9 (H) 12/09/2023 0307   RBC 3.30 (L) 12/09/2023 0307   HGB 7.1 (L) 12/09/2023 1045   HCT 21.0 (L) 12/09/2023 1045   PLT 172 12/09/2023 0307   MCV 93.0 12/09/2023 0307   MCH 31.2 12/09/2023 0307   MCHC 33.6 12/09/2023 0307   RDW 14.0 12/09/2023 0307   LYMPHSABS 1.6 12/07/2023 2024   MONOABS 0.8 12/07/2023 2024   EOSABS 0.0 12/07/2023 2024   BASOSABS 0.1 12/07/2023 2024    BMET    Component Value Date/Time   NA 137 12/10/2023 0355   K 3.7 12/10/2023 0355   CL 107 12/10/2023 0355   CO2 24 12/10/2023 0355   GLUCOSE 117 (H) 12/10/2023 0355   BUN 7 (L) 12/10/2023 0355   CREATININE 0.71 12/10/2023 0355   CALCIUM 7.4 (L) 12/10/2023 0355   GFRNONAA >60 12/10/2023 0355   GFRAA >60 09/04/2019 0249    INR    Component Value Date/Time   INR 1.3 (H) 12/07/2023 2024     Intake/Output Summary (Last 24 hours) at 12/10/2023 0826 Last data filed at 12/10/2023 0553 Gross per 24 hour  Intake 3931.33 ml  Output 2220 ml  Net 1711.33 ml     Assessment/Plan:  78 y.o. female is s/p excision of R ax-fem bypass 1 Day Post-Op   R foot warm with monophasic DP by doppler; palpable L DP R groin vac with good seal Continue JP R chest Continue broad spectrum IV abx until culture data returns; ID consulted ABL anemia: transfused 2u pRBC; CBC pending; can start subcutaneous heparin if H&H stable Asa, plavix, statin daily OOB with therapy  teams    Emilie Rutter, PA-C [facility] [phone] 12/10/2023 8:26 AM   I have seen and evaluated the patient. I agree with the PA note as documented above.  Underwent excision of infected right axillofemoral bypass graft yesterday with vein patch of the right axillary artery and right femoral to distal bypass.  VAC in the groin with good seal.  Monophasic Doppler signal in the right foot.  Right radial palpable.  Has drain in the chest wall.  Overall looks good.  Hemoglobin today 10 after transfusion.  Cephus Shelling, MD [facility] [place] Office: [phone]

## 2023-12-10 NOTE — Progress Notes (Signed)
PROGRESS NOTE    Jill Higgins  WJX:[id number] DOB: 1946/10/03 DOA: 12/07/2023 PCP: Merri Brunette, MD   Brief Narrative: Jill Higgins is a 78 y.o. female with a history of hypertension, diabetes mellitus type 2, depression, anxiety, PAD with thrombosis of right extremity iliac/common femoral and disabling claudication status post axillofemoral bypass graft.  Patient presented secondary to discharge from groin incision with associated chills concerning for evidence of infection.  Patient met sepsis criteria on admission to start empiric antibiotics.  Blood cultures obtained prior to antibiotics.  Vascular surgery consulted with plan for surgical management.   Assessment/Plan:  Severe sepsis Present on admission with associated lactic acidosis. Secondary to graft infection. Blood cultures (1/17) obtained on admission and are significant for MRSA. Empiric Vancomycin and Cefepime started and transitioned to Vancomycin monotherapy. ID consulted.  Right axillo-femoral bypass graft infection Present on admission. Surgery performed on 12/26. Vascular surgery consulted and are following. Patient started empirically on Vancomycin and Cefepime. WBC of 24,900 on admission is improving. Cefepime discontinued. -Continue Vancomycin -Vascular surgery recommendations: surgery today  MRSA bacteremia Source is likely right groin wound. ID consulted. -ID recommendations: Vancomycin (plan for 4-6 weeks), Transthoracic Echocardiogram  PAD S/p axillo-femoral bypass graft. Patient follows with vascular surgery as an outpatient. LDL is down to 15. Patient is on Lipitor, aspirin and Plavix. -Continue Lipitor, aspirin and Plavix  Primary hypertension Patient is on amlodipine, losartan and Toprol Xl as an outpatient. Antihypertensives held on admission secondary to hypotension.  Diabetes mellitus, type 2 Uncontrolled with hyperglycemia. Last hemoglobin A1C of 9.5%. Patient is managed on insulin glargine,  insulin aspart, and metformin as an outpatient and started on SSI on admission. Blood sugar low-normal -Continue SSI -Hold long-acting insulin for now.  Acute blood loss anemia Perioperative blood loss Secondary to vascular surgery. Patient with hemoglobin down to 7.1 g/dL requiring a transfusion of 2 units of PRBC.  Depression Anxiety -Continue Cymbalta  Insomnia -Continue Restoril   DVT prophylaxis: SCDs Code Status:   Code Status: Full Code Family Communication: None at bedside Disposition Plan: Discharge pending ongoing vascular surgery recommendations, ID recommendations, transition to outpatient antibiotics and eventual PT/OT recommendations   Consultants:  Vascular surgery Infectious disease  Procedures:  1/19: Vascular surgery Harvest of left arm basilic vein Redo exposure of right axillary artery Excision of right axillary artery to femoral artery bypass Vein patch angioplasty right axillary artery Vein patch angioplasty of right femoral artery distal bypass Vac right groin  Antimicrobials: Vancomycin Cefepime   Subjective: Some pain of her right groin. Feels okay today. Afebrile overnight.  Objective: BP (!) 103/52 (BP Location: Left Leg)   Pulse (!) 54   Temp 97.6 F (36.4 C) (Oral)   Resp 16   Ht 5' 2.99" (1.6 m)   Wt 50 kg   SpO2 100%   BMI 19.53 kg/m   Examination:  General exam: Appears calm and comfortable Respiratory system: Clear to auscultation. Respiratory effort normal. Cardiovascular system: S1 & S2 heard, RRR. Gastrointestinal system: Abdomen is nondistended, soft and nontender. Normal bowel sounds heard. Central nervous system: Alert and oriented. No focal neurological deficits. Psychiatry: Judgement and insight appear normal. Mood & affect appropriate.    Data Reviewed: I have personally reviewed following labs and imaging studies   Last CBC Lab Results  Component Value Date   WBC 13.9 (H) 12/09/2023   HGB 7.1 (L)  12/09/2023   HCT 21.0 (L) 12/09/2023   MCV 93.0 12/09/2023   MCH 31.2 12/09/2023  RDW 14.0 12/09/2023   PLT 172 12/09/2023     Last metabolic panel Lab Results  Component Value Date   GLUCOSE 117 (H) 12/10/2023   NA 137 12/10/2023   K 3.7 12/10/2023   CL 107 12/10/2023   CO2 24 12/10/2023   BUN 7 (L) 12/10/2023   CREATININE 0.71 12/10/2023   GFRNONAA >60 12/10/2023   CALCIUM 7.4 (L) 12/10/2023   PROT 3.7 (L) 12/10/2023   ALBUMIN 1.8 (L) 12/10/2023   BILITOT 0.8 12/10/2023   ALKPHOS 38 12/10/2023   AST 20 12/10/2023   ALT 15 12/10/2023   ANIONGAP 6 12/10/2023     Creatinine Clearance: Estimated Creatinine Clearance: 45.7 mL/min (by C-G formula based on SCr of 0.71 mg/dL).  Recent Results (from the past 240 hours)  Culture, blood (Routine x 2)     Status: Abnormal (Preliminary result)   Collection Time: 12/07/23  8:17 PM   Specimen: BLOOD  Result Value Ref Range Status   Specimen Description BLOOD LEFT ANTECUBITAL  Final   Special Requests   Final    BOTTLES DRAWN AEROBIC AND ANAEROBIC Blood Culture results may not be optimal due to an inadequate volume of blood received in culture bottles   Culture  Setup Time   Final    GRAM POSITIVE COCCI ANAEROBIC BOTTLE ONLY CRITICAL RESULT CALLED TO, READ BACK BY AND VERIFIED WITH: PHARMD JESSICA MILLEN [phone] AT 1411 BY EC    Culture (A)  Final    STAPHYLOCOCCUS AUREUS SUSCEPTIBILITIES TO FOLLOW Performed at [facility], [address], [place], [place] [address]    Report Status PENDING  Incomplete  Blood Culture ID Panel (Reflexed)     Status: Abnormal   Collection Time: 12/07/23  8:17 PM  Result Value Ref Range Status   Enterococcus faecalis NOT DETECTED NOT DETECTED Final   Enterococcus Faecium NOT DETECTED NOT DETECTED Final   Listeria monocytogenes NOT DETECTED NOT DETECTED Final   Staphylococcus species DETECTED (A) NOT DETECTED Final    Comment: CRITICAL RESULT CALLED TO, READ BACK BY AND VERIFIED  WITH: PHARMD JESSICA MILLEN [phone] AT 1411 BY EC    Staphylococcus aureus (BCID) DETECTED (A) NOT DETECTED Final    Comment: Methicillin (oxacillin)-resistant Staphylococcus aureus (MRSA). MRSA is predictably resistant to beta-lactam antibiotics (except ceftaroline). Preferred therapy is vancomycin unless clinically contraindicated. Patient requires contact precautions if  hospitalized. CRITICAL RESULT CALLED TO, READ BACK BY AND VERIFIED WITH: PHARMD JESSICA MILLEN [id number] AT 1411 BY EC    Staphylococcus epidermidis NOT DETECTED NOT DETECTED Final   Staphylococcus lugdunensis NOT DETECTED NOT DETECTED Final   Streptococcus species NOT DETECTED NOT DETECTED Final   Streptococcus agalactiae NOT DETECTED NOT DETECTED Final   Streptococcus pneumoniae NOT DETECTED NOT DETECTED Final   Streptococcus pyogenes NOT DETECTED NOT DETECTED Final   A.calcoaceticus-baumannii NOT DETECTED NOT DETECTED Final   Bacteroides fragilis NOT DETECTED NOT DETECTED Final   Enterobacterales NOT DETECTED NOT DETECTED Final   Enterobacter cloacae complex NOT DETECTED NOT DETECTED Final   Escherichia coli NOT DETECTED NOT DETECTED Final   Klebsiella aerogenes NOT DETECTED NOT DETECTED Final   Klebsiella oxytoca NOT DETECTED NOT DETECTED Final   Klebsiella pneumoniae NOT DETECTED NOT DETECTED Final   Proteus species NOT DETECTED NOT DETECTED Final   Salmonella species NOT DETECTED NOT DETECTED Final   Serratia marcescens NOT DETECTED NOT DETECTED Final   Haemophilus influenzae NOT DETECTED NOT DETECTED Final   Neisseria meningitidis NOT DETECTED NOT DETECTED Final   Pseudomonas  aeruginosa NOT DETECTED NOT DETECTED Final   Stenotrophomonas maltophilia NOT DETECTED NOT DETECTED Final   Candida albicans NOT DETECTED NOT DETECTED Final   Candida auris NOT DETECTED NOT DETECTED Final   Candida glabrata NOT DETECTED NOT DETECTED Final   Candida krusei NOT DETECTED NOT DETECTED Final   Candida parapsilosis NOT  DETECTED NOT DETECTED Final   Candida tropicalis NOT DETECTED NOT DETECTED Final   Cryptococcus neoformans/gattii NOT DETECTED NOT DETECTED Final   Meth resistant mecA/C and MREJ DETECTED (A) NOT DETECTED Final    Comment: CRITICAL RESULT CALLED TO, READ BACK BY AND VERIFIED WITH: PHARMD JESSICA MILLEN [phone] AT 1411 BY EC Performed at [facility], [address] [place], [place] [address]   Culture, blood (Routine x 2)     Status: None (Preliminary result)   Collection Time: 12/07/23  8:20 PM   Specimen: BLOOD RIGHT ARM  Result Value Ref Range Status   Specimen Description BLOOD RIGHT ARM  Final   Special Requests   Final    BOTTLES DRAWN AEROBIC AND ANAEROBIC Blood Culture results may not be optimal due to an inadequate volume of blood received in culture bottles   Culture   Final    NO GROWTH 2 DAYS Performed at [facility], [address], [place], [place] [address]    Report Status PENDING  Incomplete  Surgical pcr screen     Status: Abnormal   Collection Time: 12/08/23  7:57 PM   Specimen: Nasal Mucosa; Nasal Swab  Result Value Ref Range Status   MRSA, PCR POSITIVE (A) NEGATIVE Final    Comment: RESULT CALLED TO, READ BACK BY AND VERIFIED WITH: T IRBY,RN@[id number]  12/08/23 MK    Staphylococcus aureus POSITIVE (A) NEGATIVE Final    Comment: (NOTE) The Xpert SA Assay (FDA approved for NASAL specimens in patients 79 years of age and older), is one component of a comprehensive surveillance program. It is not intended to diagnose infection nor to guide or monitor treatment. Performed at [facility], [address] [place], [place] [address]   Culture, blood (Routine X 2) w Reflex to ID Panel     Status: None (Preliminary result)   Collection Time: 12/09/23  3:07 AM   Specimen: BLOOD RIGHT ARM  Result Value Ref Range Status   Specimen Description BLOOD RIGHT ARM  Final   Special Requests   Final    BOTTLES DRAWN AEROBIC AND ANAEROBIC Blood Culture  results may not be optimal due to an inadequate volume of blood received in culture bottles   Culture   Final    NO GROWTH < 12 HOURS Performed at [facility], [address], [place], [place] [address]    Report Status PENDING  Incomplete  Culture, blood (Routine X 2) w Reflex to ID Panel     Status: None (Preliminary result)   Collection Time: 12/09/23  3:07 AM   Specimen: BLOOD RIGHT HAND  Result Value Ref Range Status   Specimen Description BLOOD RIGHT HAND  Final   Special Requests   Final    BOTTLES DRAWN AEROBIC AND ANAEROBIC Blood Culture results may not be optimal due to an inadequate volume of blood received in culture bottles   Culture   Final    NO GROWTH < 12 HOURS Performed at [facility], [address] [place], [place] [address]    Report Status PENDING  Incomplete  Aerobic/Anaerobic Culture w Gram Stain (surgical/deep wound)  Status: None (Preliminary result)   Collection Time: 12/09/23 10:16 AM   Specimen: Wound  Result Value Ref Range Status   Specimen Description WOUND  Final   Special Requests RIGHT GROIN  Final   Gram Stain   Final    NO WBC SEEN RARE GRAM POSITIVE COCCI IN PAIRS IN SINGLES Performed at [facility], [address], [place], [place] [address]    Culture PENDING  Incomplete   Report Status PENDING  Incomplete  Aerobic/Anaerobic Culture w Gram Stain (surgical/deep wound)     Status: None (Preliminary result)   Collection Time: 12/09/23 10:18 AM   Specimen: Wound  Result Value Ref Range Status   Specimen Description WOUND  Final   Special Requests portion of graft from right groin  Final   Gram Stain   Final    MODERATE WBC PRESENT,BOTH PMN AND MONONUCLEAR FEW GRAM POSITIVE COCCI IN PAIRS Performed at [facility], [address] [place], [place] [address]    Culture PENDING  Incomplete   Report Status PENDING  Incomplete  Aerobic/Anaerobic Culture w Gram Stain (surgical/deep wound)     Status: None  (Preliminary result)   Collection Time: 12/09/23 10:23 AM   Specimen: Wound  Result Value Ref Range Status   Specimen Description WOUND  Final   Special Requests DISTAL PORTION OF GRAFT FROM RIGHT GROIN  Final   Gram Stain   Final    RARE WBC PRESENT, PREDOMINANTLY MONONUCLEAR NO ORGANISMS SEEN Performed at [facility], [address], [place], [place] [address]    Culture PENDING  Incomplete   Report Status PENDING  Incomplete      Radiology Studies: VAS [place] UPPER EXT VEIN MAPPING (PRE-OP AVF) Result Date: 12/08/2023 UPPER EXTREMITY VEIN MAPPING Patient Name:  Jill Higgins  Date of Exam:   12/08/2023 Medical Rec #: [id number]        Accession #:    [id number] Date of Birth: [birth year]         Patient Gender: F Patient Age:   79 years Exam Location:  [facility] Procedure:      VAS [place] UPPER EXT VEIN MAPPING (PRE-OP AVF) Referring Phys: JOSHUA ROBINS --------------------------------------------------------------------------------  Indications: History of PAD; patient is pre-operative for bypass. History: Right groin purulent drainage after axillofemoral bypass. Axillofemoral          bypass 11/15/23. Stenting of right iliac and common femoral arteries          04/20/23. Right common femoral endarterectomy and right femoral to above          knee popliteal bypass with vein 06/03/2018.  Limitations: Patient constant movement, grabbing rails of bed and pulling              herself up, grabbing technologist's arm, lines and bandages Comparison Study: No prior study on file Performing Technologist: Sherren Kerns RVS  Examination Guidelines: A complete evaluation includes B-mode imaging, spectral Doppler, color Doppler, and power Doppler as needed of all accessible portions of each vessel. Bilateral testing is considered an integral part of a complete examination. Limited examinations for reoccurring indications may be performed as noted.  +-----------------+-------------+----------+--------------------------------+ Right Cephalic   Diameter (cm)Depth (cm)            Findings             +-----------------+-------------+----------+--------------------------------+ Prox upper arm       0.27        0.48                                    +-----------------+-------------+----------+--------------------------------+  Mid upper arm        0.26        0.58                                    +-----------------+-------------+----------+--------------------------------+ Dist upper arm       0.26        0.35                                    +-----------------+-------------+----------+--------------------------------+ Antecubital fossa    0.44        0.28                                    +-----------------+-------------+----------+--------------------------------+ Prox forearm       0.30/0.33  1.06/0.49            branching             +-----------------+-------------+----------+--------------------------------+ Mid forearm                             not visualized and bandages/line +-----------------+-------------+----------+--------------------------------+ Dist forearm         0.15        0.27                                    +-----------------+-------------+----------+--------------------------------+ Wrist                                     not visualized and movement    +-----------------+-------------+----------+--------------------------------+ +-----------------+-------------+---------+-----------------------------------+ Right Basilic    Diameter (cm)  Depth               Findings                                               (cm)                                       +-----------------+-------------+---------+-----------------------------------+ Prox upper arm       0.32       1.20                                        +-----------------+-------------+---------+-----------------------------------+ Mid upper arm        0.26       1.28                                       +-----------------+-------------+---------+-----------------------------------+ Dist upper arm       0.45       1.43                                       +-----------------+-------------+---------+-----------------------------------+  Antecubital fossa    0.35       1.07                                       +-----------------+-------------+---------+-----------------------------------+ Prox forearm         0.23       0.41                                       +-----------------+-------------+---------+-----------------------------------+ Mid forearm                                not visualized and bandages     +-----------------+-------------+---------+-----------------------------------+ Distal forearm                                 not visualized and                                                          bandages/movement          +-----------------+-------------+---------+-----------------------------------+ Wrist                                      not visualized and movement     +-----------------+-------------+---------+-----------------------------------+ +-----------------+-------------+----------+---------------------------+ Left Cephalic    Diameter (cm)Depth (cm)         Findings           +-----------------+-------------+----------+---------------------------+ Prox upper arm       0.28        0.49                               +-----------------+-------------+----------+---------------------------+ Mid upper arm        0.24        0.52                               +-----------------+-------------+----------+---------------------------+ Dist upper arm       0.28        0.43                               +-----------------+-------------+----------+---------------------------+ Antecubital  fossa                       not visualized and bandages +-----------------+-------------+----------+---------------------------+ Prox forearm       0.21/0.20  0.49/0.68          branching          +-----------------+-------------+----------+---------------------------+ Mid forearm          0.19        0.47                               +-----------------+-------------+----------+---------------------------+ Dist forearm  0.18        0.54                               +-----------------+-------------+----------+---------------------------+ +-----------------+-------------+----------+-----------------------------------+ Left Basilic     Diameter (cm)Depth (cm)             Findings               +-----------------+-------------+----------+-----------------------------------+ Prox upper arm       0.35        1.20                 origin                +-----------------+-------------+----------+-----------------------------------+ Mid upper arm        0.31        1.34                                       +-----------------+-------------+----------+-----------------------------------+ Dist upper arm       0.37        1.00                                       +-----------------+-------------+----------+-----------------------------------+ Antecubital fossa                               not visualized and                                                        movement/grabbing tech        +-----------------+-------------+----------+-----------------------------------+ Prox forearm                                    not visualized and                                                        movement/grabbing tech        +-----------------+-------------+----------+-----------------------------------+ Mid forearm                                     not visualized and                                                        movement/grabbing tech         +-----------------+-------------+----------+-----------------------------------+ Distal forearm                                  not visualized and  movement/grabbing tech        +-----------------+-------------+----------+-----------------------------------+ Wrist                                           not visualized and                                                        movement/grabbing tech        +-----------------+-------------+----------+-----------------------------------+ *See table(s) above for measurements and observations.  Diagnosing physician: Sherald Hess MD Electronically signed by Sherald Hess MD on 12/08/2023 at 3:22:28 PM.    Final       LOS: 3 days    Jacquelin Hawking, MD [facility] 12/10/2023, 7:47 AM   If 7PM-7AM, please contact night-coverage [email or web address]

## 2023-12-10 NOTE — TOC Benefit Eligibility Note (Signed)
Pharmacy Patient Advocate Encounter  Insurance verification completed.   The patient is insured through  [employer] Part D    Ran test claim for Creon. Currently a quantity of 180 is a 30 day supply and the co-pay is $302.00. This will allow patient to meet deductible. Co-pay may lower .  Ran test claim for Zenpep. Currently a quantity of 180 is a 30 day supply and the co-pay is $302.00. This will allow patient to meet deductible. Co-pay may lower. .  Ran test claim for Viokace. Currently a quantity of 270 is a 30 day supply and the co-pay is $856.46 .  Patient may be eligible for a Medicare prescription Payment plan. The patient will need to reach out to their insurance company to enrol in the payment plan to spread out their payments throughout the year, If available.  This test claim was processed through [facility]- copay amounts may vary at other pharmacies due to pharmacy/plan contracts, or as the patient moves through the different stages of their insurance plan.

## 2023-12-10 NOTE — Progress Notes (Signed)
Echocardiogram 2D Echocardiogram has been performed.  Sharece Fleischhacker N Demmi Sindt,RDCS 12/10/2023, 8:30 AM

## 2023-12-10 NOTE — Evaluation (Signed)
Physical Therapy Evaluation Patient Details Name: Jill Higgins MRN: [id number] DOB: 05/30/1946 Today's Date: 12/10/2023  History of Present Illness  The pt is a 78 yo female presenting 1/17 with drainage from R groin incision (recent R axillary - femoral bypass on 12/26). Now s/p redo R axilla - femoral artery bypass with wound vac placement 1/19. PMH includes: ight common femoral endarterectomy with right femoral to above-knee popliteal bypass, stenting of the right iliac and common femoral arteries, bronchitis, bulging disc, depression w/anxiety, DM II, fibromyalgia, GERD, glaucoma, hyperlipidemia, HTN, PVD, and sleep apnea.   Clinical Impression  Pt in bed upon arrival of PT, agreeable to evaluation at this time. Prior to admission the pt was ambulating without assistance or use of DME. She reports she had been managing home alone without falls or need for assistance since her surgery in December. The pt was limited today by pain in her RUE and R groin, but able to complete bed mobility with increased time and short steps from bed to recliner with HHA. She will benefit from continued skilled PT as she is not yet mobilizing well enough to return home with intermittent assist from friends and family, pt it is realistic to work towards d/c home with HHPT once pain improves.       If plan is discharge home, recommend the following: Help with stairs or ramp for entrance;Assist for transportation;Assistance with cooking/housework;A lot of help with walking and/or transfers;A little help with bathing/dressing/bathroom   Can travel by private vehicle        Equipment Recommendations None recommended by PT  Recommendations for Other Services       Functional Status Assessment Patient has had a recent decline in their functional status and demonstrates the ability to make significant improvements in function in a reasonable and predictable amount of time.     Precautions / Restrictions  Precautions Precautions: Fall Restrictions Weight Bearing Restrictions Per Provider Order: No      Mobility  Bed Mobility Overal bed mobility: Needs Assistance Bed Mobility: Supine to Sit     Supine to sit: Min assist     General bed mobility comments: minA due to pain, increased time    Transfers Overall transfer level: Needs assistance Equipment used: 1 person hand held assist Transfers: Sit to/from Stand, Bed to chair/wheelchair/BSC Sit to Stand: Mod assist   Step pivot transfers: Mod assist       General transfer comment: modA to rise and steady, pt bracing LE on bed. small staggering steps due to pain.    Ambulation/Gait               General Gait Details: limited to small steps to recliner     Balance Overall balance assessment: Needs assistance Sitting-balance support: No upper extremity supported, Feet supported Sitting balance-Leahy Scale: Good     Standing balance support: During functional activity, Single extremity supported Standing balance-Leahy Scale: Poor Standing balance comment: would benefit from BUE support                             Pertinent Vitals/Pain Pain Assessment Pain Assessment: Faces Faces Pain Scale: Hurts little more Pain Location: R arm and groin Pain Descriptors / Indicators: Discomfort, Grimacing, Sore Pain Intervention(s): Limited activity within patient's tolerance, Premedicated before session, Monitored during session, Repositioned    Home Living Family/patient expects to be discharged to:: Private residence Living Arrangements: Alone Available Help at Discharge: Family;Available PRN/intermittently  Type of Home: Apartment Home Access: Level entry       Home Layout: One level Home Equipment: Agricultural consultant (2 wheels);Tub bench;Grab bars - tub/shower;Cane - single point;BSC/3in1;Rollator (4 wheels) Additional Comments: Pt has two friends who live close who can assist if needed.    Prior  Function Prior Level of Function : Independent/Modified Independent;Driving             Mobility Comments: Pt states that occasionally she uses the cane if her pain is really severe, returned home not using DME after surery in dec. no falls ADLs Comments: independent     Extremity/Trunk Assessment   Upper Extremity Assessment Upper Extremity Assessment: Defer to OT evaluation    Lower Extremity Assessment Lower Extremity Assessment: RLE deficits/detail RLE Deficits / Details: limited by pain with hip flexion. pain at toes and bottom of foot to the touch, denies numbness RLE: Unable to fully assess due to pain RLE Sensation: WNL RLE Coordination: WNL    Cervical / Trunk Assessment Cervical / Trunk Assessment: Normal  Communication   Communication Communication: No apparent difficulties Cueing Techniques: Verbal cues  Cognition Arousal: Alert Behavior During Therapy: WFL for tasks assessed/performed Overall Cognitive Status: Within Functional Limits for tasks assessed                                          General Comments General comments (skin integrity, edema, etc.): VSS on RA        Assessment/Plan    PT Assessment Patient needs continued PT services  PT Problem List Decreased balance;Decreased activity tolerance;Decreased mobility;Pain       PT Treatment Interventions DME instruction;Therapeutic activities;Gait training;Therapeutic exercise;Functional mobility training;Balance training;Stair training;Patient/family education    PT Goals (Current goals can be found in the Care Plan section)  Acute Rehab PT Goals Patient Stated Goal: home when able PT Goal Formulation: With patient Time For Goal Achievement: 12/24/23 Potential to Achieve Goals: Good    Frequency Min 1X/week        AM-PAC PT "6 Clicks" Mobility  Outcome Measure Help needed turning from your back to your side while in a flat bed without using bedrails?: A  Little Help needed moving from lying on your back to sitting on the side of a flat bed without using bedrails?: A Little Help needed moving to and from a bed to a chair (including a wheelchair)?: A Little Help needed standing up from a chair using your arms (e.g., wheelchair or bedside chair)?: A Little Help needed to walk in hospital room?: Total (<20 ft) Help needed climbing 3-5 steps with a railing? : Total 6 Click Score: 14    End of Session Equipment Utilized During Treatment: Gait belt Activity Tolerance: Patient tolerated treatment well Patient left: in chair;with call bell/phone within reach;with chair alarm set Nurse Communication: Mobility status PT Visit Diagnosis: Unsteadiness on feet (R26.81);Other abnormalities of gait and mobility (R26.89)    Time: 7846-9629 PT Time Calculation (min) (ACUTE ONLY): 33 min   Charges:   PT Evaluation $PT Eval Moderate Complexity: 1 Mod PT Treatments $Therapeutic Activity: 8-22 mins PT General Charges $$ ACUTE PT VISIT: 1 Visit         Vickki Muff, PT, DPT   Acute Rehabilitation Department Office [phone] Secure Chat Communication Preferred   Ronnie Derby 12/10/2023, 11:12 AM

## 2023-12-10 NOTE — Progress Notes (Signed)
Pharmacy Antibiotic Note  Jill Higgins is a 78 y.o. female admitted on 12/07/2023 with  infected right axillary artery to femoral artery bypass graft s/p excision, angioplasty and wound vac and MRSA Bacteremia.  Pharmacy has been consulted for vancomycin dosing.  1/20 Vancomycin 750mg  Q 24 hr with Est AUC: 471 Scr used: 0.8 mg/dL; Vd coeff: 0.72 L/kg Patient was given extra 750mg  dose in OR 1/19 then received usual q24hr dose in the evening. Will move next dose back by 10 hours for clearance.   Plan: Vancomycin 750mg  q24hr - move next dose back by 10 hours for clearance.  Monitor cultures, clinical status, renal function, vancomycin level Narrow abx as able and f/u duration per ID  F/u TTE/TEE  Height: 5' 2.99" (160 cm) Weight: 50 kg (110 lb 3.7 oz) IBW/kg (Calculated) : 52.38  Temp (24hrs), Avg:98 F (36.7 C), Min:97.4 F (36.3 C), Max:98.6 F (37 C)  Recent Labs  Lab 12/07/23 2024 12/07/23 2030 12/07/23 2036 12/08/23 0052 12/08/23 0529 12/09/23 0307 12/09/23 0914 12/10/23 0355  WBC 24.9*  --   --   --  17.4* 13.9*  --   --   CREATININE 1.15* 0.90  --   --  0.93 0.81 0.60 0.71  LATICACIDVEN  --   --  5.7* 4.2* 1.6  --   --   --     Estimated Creatinine Clearance: 45.7 mL/min (by C-G formula based on SCr of 0.71 mg/dL).    Allergies  Allergen Reactions   Wound Dressing Adhesive Rash and Other (See Comments)    Pt states that tape and electrodes leave red "scars" on her skin and her skin is very sensitive.  Paper tape ok to use    Antimicrobials this admission: Vanc 1/17>>  Cefe 1/17>>1/8  Dose adjustments this admission: N/a  Microbiology results: 1/17 bcx 1/4 MRSA  1/18 MRSA +  1/19 wound: pending  Thank you for allowing pharmacy to be a part of this patient's care.  Alphia Moh, PharmD, BCPS, BCCP Clinical Pharmacist  Please check AMION for all [facility] Pharm[facility] phone numbers After 10:00 PM, call Main Pharmacy [phone]

## 2023-12-10 NOTE — Progress Notes (Signed)
[facility]   Reason for visit: Follow up on MRSA bacteremia  Interval History: She is status post excision of infected right axillofemoral bypass graft due to infection.  WBC 11.7.  She has been afebrile greater than 24 hours.  He has no complaints today.  Physical Exam: Constitutional:  Vitals:   12/10/23 1149 12/10/23 1200  BP: 138/60 (!) 164/77  Pulse: 83 81  Resp: 18 14  Temp: 98.4 F (36.9 C)   SpO2: 99% 100%   patient appears in NAD Respiratory: Normal respiratory effort  Review of Systems: Constitutional: negative for fevers and chills  Lab Results  Component Value Date   WBC 11.7 (H) 12/10/2023   HGB 10.0 (L) 12/10/2023   HCT 28.4 (L) 12/10/2023   MCV 91.6 12/10/2023   PLT 108 (L) 12/10/2023    Lab Results  Component Value Date   CREATININE 0.71 12/10/2023   BUN 7 (L) 12/10/2023   NA 137 12/10/2023   K 3.7 12/10/2023   CL 107 12/10/2023   CO2 24 12/10/2023    Lab Results  Component Value Date   ALT 15 12/10/2023   AST 20 12/10/2023   ALKPHOS 38 12/10/2023     Microbiology: Recent Results (from the past 240 hours)  Culture, blood (Routine x 2)     Status: Abnormal   Collection Time: 12/07/23  8:17 PM   Specimen: BLOOD  Result Value Ref Range Status   Specimen Description BLOOD LEFT ANTECUBITAL  Final   Special Requests   Final    BOTTLES DRAWN AEROBIC AND ANAEROBIC Blood Culture results may not be optimal due to an inadequate volume of blood received in culture bottles   Culture  Setup Time   Final    GRAM POSITIVE COCCI ANAEROBIC BOTTLE ONLY CRITICAL RESULT CALLED TO, READ BACK BY AND VERIFIED WITH: PHARMD JESSICA MILLEN [phone] AT 1411 BY EC Performed at [facility] Lab, [address], [place], [place] [address]    Culture METHICILLIN RESISTANT STAPHYLOCOCCUS AUREUS (A)  Final   Report Status 12/10/2023 FINAL  Final   Organism ID, Bacteria METHICILLIN RESISTANT STAPHYLOCOCCUS AUREUS  Final       Susceptibility   Methicillin resistant staphylococcus aureus - MIC*    CIPROFLOXACIN >=8 RESISTANT Resistant     ERYTHROMYCIN <=0.25 SENSITIVE Sensitive     GENTAMICIN <=0.5 SENSITIVE Sensitive     OXACILLIN >=4 RESISTANT Resistant     TETRACYCLINE <=1 SENSITIVE Sensitive     VANCOMYCIN <=0.5 SENSITIVE Sensitive     TRIMETH/SULFA <=10 SENSITIVE Sensitive     CLINDAMYCIN <=0.25 SENSITIVE Sensitive     RIFAMPIN <=0.5 SENSITIVE Sensitive     Inducible Clindamycin NEGATIVE Sensitive     LINEZOLID 2 SENSITIVE Sensitive     * METHICILLIN RESISTANT STAPHYLOCOCCUS AUREUS  Blood Culture ID Panel (Reflexed)     Status: Abnormal   Collection Time: 12/07/23  8:17 PM  Result Value Ref Range Status   Enterococcus faecalis NOT DETECTED NOT DETECTED Final   Enterococcus Faecium NOT DETECTED NOT DETECTED Final   Listeria monocytogenes NOT DETECTED NOT DETECTED Final   Staphylococcus species DETECTED (A) NOT DETECTED Final    Comment: CRITICAL RESULT CALLED TO, READ BACK BY AND VERIFIED WITH: PHARMD JESSICA MILLEN [phone] AT 1411 BY EC    Staphylococcus aureus (BCID) DETECTED (A) NOT DETECTED Final    Comment: Methicillin (oxacillin)-resistant Staphylococcus aureus (MRSA). MRSA is predictably resistant to beta-lactam antibiotics (except ceftaroline). Preferred therapy is vancomycin unless  clinically contraindicated. Patient requires contact precautions if  hospitalized. CRITICAL RESULT CALLED TO, READ BACK BY AND VERIFIED WITH: PHARMD JESSICA MILLEN [phone] AT 1411 BY EC    Staphylococcus epidermidis NOT DETECTED NOT DETECTED Final   Staphylococcus lugdunensis NOT DETECTED NOT DETECTED Final   Streptococcus species NOT DETECTED NOT DETECTED Final   Streptococcus agalactiae NOT DETECTED NOT DETECTED Final   Streptococcus pneumoniae NOT DETECTED NOT DETECTED Final   Streptococcus pyogenes NOT DETECTED NOT DETECTED Final   A.calcoaceticus-baumannii NOT DETECTED NOT DETECTED Final   Bacteroides  fragilis NOT DETECTED NOT DETECTED Final   Enterobacterales NOT DETECTED NOT DETECTED Final   Enterobacter cloacae complex NOT DETECTED NOT DETECTED Final   Escherichia coli NOT DETECTED NOT DETECTED Final   Klebsiella aerogenes NOT DETECTED NOT DETECTED Final   Klebsiella oxytoca NOT DETECTED NOT DETECTED Final   Klebsiella pneumoniae NOT DETECTED NOT DETECTED Final   Proteus species NOT DETECTED NOT DETECTED Final   Salmonella species NOT DETECTED NOT DETECTED Final   Serratia marcescens NOT DETECTED NOT DETECTED Final   Haemophilus influenzae NOT DETECTED NOT DETECTED Final   Neisseria meningitidis NOT DETECTED NOT DETECTED Final   Pseudomonas aeruginosa NOT DETECTED NOT DETECTED Final   Stenotrophomonas maltophilia NOT DETECTED NOT DETECTED Final   Candida albicans NOT DETECTED NOT DETECTED Final   Candida auris NOT DETECTED NOT DETECTED Final   Candida glabrata NOT DETECTED NOT DETECTED Final   Candida krusei NOT DETECTED NOT DETECTED Final   Candida parapsilosis NOT DETECTED NOT DETECTED Final   Candida tropicalis NOT DETECTED NOT DETECTED Final   Cryptococcus neoformans/gattii NOT DETECTED NOT DETECTED Final   Meth resistant mecA/C and MREJ DETECTED (A) NOT DETECTED Final    Comment: CRITICAL RESULT CALLED TO, READ BACK BY AND VERIFIED WITH: PHARMD JESSICA MILLEN [phone] AT 1411 BY EC Performed at [facility], [address] [place], [place] [address]   Culture, blood (Routine x 2)     Status: None (Preliminary result)   Collection Time: 12/07/23  8:20 PM   Specimen: BLOOD RIGHT ARM  Result Value Ref Range Status   Specimen Description BLOOD RIGHT ARM  Final   Special Requests   Final    BOTTLES DRAWN AEROBIC AND ANAEROBIC Blood Culture results may not be optimal due to an inadequate volume of blood received in culture bottles   Culture   Final    NO GROWTH 3 DAYS Performed at [facility], [address], [place], [place] [address]    Report Status PENDING   Incomplete  Surgical pcr screen     Status: Abnormal   Collection Time: 12/08/23  7:57 PM   Specimen: Nasal Mucosa; Nasal Swab  Result Value Ref Range Status   MRSA, PCR POSITIVE (A) NEGATIVE Final    Comment: RESULT CALLED TO, READ BACK BY AND VERIFIED WITH: T IRBY,RN@2258  12/08/23 MK    Staphylococcus aureus POSITIVE (A) NEGATIVE Final    Comment: (NOTE) The Xpert SA Assay (FDA approved for NASAL specimens in patients 32 years of age and older), is one component of a comprehensive surveillance program. It is not intended to diagnose infection nor to guide or monitor treatment. Performed at [facility], [address] [place], [place] [address]   Culture, blood (Routine X 2) w Reflex to ID Panel     Status: None (Preliminary result)   Collection Time: 12/09/23  3:07 AM   Specimen: BLOOD RIGHT ARM  Result Value Ref Range Status  Specimen Description BLOOD RIGHT ARM  Final   Special Requests   Final    BOTTLES DRAWN AEROBIC AND ANAEROBIC Blood Culture results may not be optimal due to an inadequate volume of blood received in culture bottles   Culture   Final    NO GROWTH 1 DAY Performed at [facility] Lab, [address], [place], [place] [address]    Report Status PENDING  Incomplete  Culture, blood (Routine X 2) w Reflex to ID Panel     Status: None (Preliminary result)   Collection Time: 12/09/23  3:07 AM   Specimen: BLOOD RIGHT HAND  Result Value Ref Range Status   Specimen Description BLOOD RIGHT HAND  Final   Special Requests   Final    BOTTLES DRAWN AEROBIC AND ANAEROBIC Blood Culture results may not be optimal due to an inadequate volume of blood received in culture bottles   Culture   Final    NO GROWTH 1 DAY Performed at [facility], [address] [place], [place] [address]    Report Status PENDING  Incomplete  Aerobic/Anaerobic Culture w Gram Stain (surgical/deep wound)     Status: None (Preliminary result)   Collection Time: 12/09/23 10:16  AM   Specimen: Wound  Result Value Ref Range Status   Specimen Description WOUND  Final   Special Requests RIGHT GROIN  Final   Gram Stain   Final    NO WBC SEEN RARE GRAM POSITIVE COCCI IN PAIRS IN SINGLES Performed at [facility], [address], [place], [place] [address]    Culture MODERATE STAPHYLOCOCCUS AUREUS  Final   Report Status PENDING  Incomplete  Aerobic/Anaerobic Culture w Gram Stain (surgical/deep wound)     Status: None (Preliminary result)   Collection Time: 12/09/23 10:18 AM   Specimen: Wound  Result Value Ref Range Status   Specimen Description WOUND  Final   Special Requests portion of graft from right groin  Final   Gram Stain   Final    MODERATE WBC PRESENT,BOTH PMN AND MONONUCLEAR FEW GRAM POSITIVE COCCI IN PAIRS    Culture   Final    ABUNDANT STAPHYLOCOCCUS AUREUS SUSCEPTIBILITIES TO FOLLOW Performed at [facility], [address] [place], [place] [address]    Report Status PENDING  Incomplete  Aerobic/Anaerobic Culture w Gram Stain (surgical/deep wound)     Status: None (Preliminary result)   Collection Time: 12/09/23 10:23 AM   Specimen: Wound  Result Value Ref Range Status   Specimen Description WOUND  Final   Special Requests DISTAL PORTION OF GRAFT FROM RIGHT GROIN  Final   Gram Stain   Final    RARE WBC PRESENT, PREDOMINANTLY MONONUCLEAR NO ORGANISMS SEEN Performed at [facility] Lab, [address] [place], [place] [address]    Culture FEW STAPHYLOCOCCUS AUREUS  Final   Report Status PENDING  Incomplete    Impression/Plan:  1.  MRSA bacteremia.  She is on daptomycin for bacteremia with the infected graft.  Graft culture growing out Staph aureus which correlates.  TTE does not suggest a vegetation. I do recommend a TEE.  I have personally spent 40 minutes involved in face-to-face and non-face-to-face activities for this patient on the day of the visit. Professional time spent includes the following activities: Preparing  to see the patient (review of tests), Obtaining and/or reviewing separately obtained history (admission/discharge record), Performing a medically appropriate examination and/or evaluation , Ordering medications/tests/procedures, referring and communicating with  other health care professionals, Documenting clinical information in the EMR, Independently interpreting results (not separately reported), Communicating results to the patient/family/caregiver, Counseling and educating the patient/family/caregiver and Care coordination (not separately reported).

## 2023-12-10 NOTE — Progress Notes (Signed)
PHARMACIST LIPID MONITORING   Jill Higgins is a 78 y.o. female admitted on 12/07/2023 with infected right axillary artery to femoral artery bypass graft s/p excision, angioplasty and wound vac.  Pharmacy has been consulted to optimize lipid-lowering therapy with the indication of secondary prevention for clinical ASCVD.  Recent Labs:  Lipid Panel (last 6 months):   Lab Results  Component Value Date   CHOL 48 12/10/2023   TRIG 65 12/10/2023   HDL 20 (L) 12/10/2023   CHOLHDL 2.4 12/10/2023   VLDL 13 12/10/2023   LDLCALC 15 12/10/2023    Hepatic function panel (last 6 months):   Lab Results  Component Value Date   AST 20 12/10/2023   ALT 15 12/10/2023   ALKPHOS 38 12/10/2023   BILITOT 0.8 12/10/2023    SCr (since admission):   Serum creatinine: 0.71 mg/dL 56/21/30 8657 Estimated creatinine clearance: 45.7 mL/min  Current therapy and lipid therapy tolerance Current lipid-lowering therapy: atorvastatin 80mg  Previous lipid-lowering therapies (if applicable): n/a Documented or reported allergies or intolerances to lipid-lowering therapies (if applicable): n/a  Assessment:   LDL is well below goal on high intensity statin.  Plan:    1.Statin intensity (high intensity recommended for all patients regardless of the LDL):  No statin changes. The patient is already on a high intensity statin.  2.Add ezetimibe (if any one of the following):   Not indicated at this time.  3.Refer to lipid clinic:   No  4.Follow-up with:  Primary care provider - Merri Brunette, MD  5.Follow-up labs after discharge:  No changes in lipid therapy, repeat a lipid panel in one year.      Alphia Moh, PharmD, BCPS, BCCP Clinical Pharmacist  Please check AMION for all [facility] Pharmacy phone numbers After 10:00 PM, call Main Pharmacy [phone]

## 2023-12-10 NOTE — Progress Notes (Signed)
Initial Nutrition Assessment  DOCUMENTATION CODES:   Severe malnutrition in context of chronic illness  INTERVENTION:  -Boost Plus TID -Magic Cup BID -Multivitamin daily -Discussed with MD recommendation to continue Creon supplementation; reached out to case manager given financial constraints  NUTRITION DIAGNOSIS:   Severe Malnutrition related to chronic illness (chronic pancreatitits) as evidenced by severe fat depletion, severe muscle depletion, energy intake < 75% for > or equal to 1 month.  GOAL:   Patient will meet greater than or equal to 90% of their needs  MONITOR:   PO intake, Supplement acceptance  REASON FOR ASSESSMENT:   Consult Assessment of nutrition requirement/status  ASSESSMENT:   Pt admitted for drainage from groin incision, chills, found to have vascular graft infection and sepsis. Pt with PMH significant for HTN, DM2, depression, anxiety, PAD (common femoral stent)   Pt on carb mod diet. Pt eaten 70% of meal this morning.  Pt reports not having a good appetite since her pancreatic surgery in 2020. Pt reports eating one meal a day which is usually dinner. Pt reports making meats and convenience foods for dinner like frozen jamaican beef patties. Pt enjoys chocolate Boost drinks and is eager to get them added to her trays. Pt open to trying [employer]. Pt reports taking a multivitamin and a brain health supplement at home, but recently stopped taking them. Pt open to trying a multivitamin here.Pt reports not taking Creon medication anymore due to financial constraints. Reached out to case manager and social work to discuss options of her getting this medication again.  Pt reports no recent weight loss but has gradually lost weight since 2020. Pt reports weighing 151 lb in 2020 and now has a usual body wt of 111-117 lbs. Reviewed wt history on file. Noted a gradual weight loss of 8.1% x 7 months which is not clinically significant for the time frame.  Meds:  Novolog TID, Magnesium sulfate 2g PRN, Colace 100 mg  Labs: Glucose 119, A1C 9.5 (09/2023), Calcium low  Right chest JP drain: 20 mL output x 24 hours Right groin vac: 75 mL output x 24 hours  NUTRITION - FOCUSED PHYSICAL EXAM:  Flowsheet Row Most Recent Value  Orbital Region Mild depletion  Upper Arm Region Severe depletion  Thoracic and Lumbar Region Severe depletion  Buccal Region Mild depletion  Temple Region Mild depletion  Clavicle Bone Region Severe depletion  Clavicle and Acromion Bone Region Severe depletion  Scapular Bone Region Severe depletion  Dorsal Hand Severe depletion  [severe in left hand]  Patellar Region Severe depletion  Anterior Thigh Region Severe depletion  Posterior Calf Region Severe depletion  Edema (RD Assessment) None  Hair Reviewed  Eyes Reviewed  Mouth Reviewed  Skin Reviewed  Nails Reviewed       Diet Order:   Diet Order             Diet Carb Modified Fluid consistency: Thin; Room service appropriate? Yes with Assist  Diet effective now                   EDUCATION NEEDS:   Education needs have been addressed  Skin:  Skin Assessment: Skin Integrity Issues: Skin Integrity Issues:: Wound VAC, Incisions Incisions: right chest (closed), right groin (closed), left arm (closed)  Last BM:  1/19  Height:   Ht Readings from Last 1 Encounters:  12/09/23 5' 2.99" (1.6 m)    Weight:   Wt Readings from Last 1 Encounters:  12/09/23 50 kg  BMI:  Body mass index is 19.53 kg/m.  Estimated Nutritional Needs:   Kcal:  1450-1650  Protein:  75-85 g  Fluid:  > 1.5 L    Maceo Pro, MS Dietetic Intern

## 2023-12-11 DIAGNOSIS — B9562 Methicillin resistant Staphylococcus aureus infection as the cause of diseases classified elsewhere: Secondary | ICD-10-CM | POA: Diagnosis not present

## 2023-12-11 DIAGNOSIS — T827XXA Infection and inflammatory reaction due to other cardiac and vascular devices, implants and grafts, initial encounter: Secondary | ICD-10-CM | POA: Diagnosis not present

## 2023-12-11 DIAGNOSIS — E43 Unspecified severe protein-calorie malnutrition: Secondary | ICD-10-CM | POA: Insufficient documentation

## 2023-12-11 LAB — CBC
HCT: 24.6 % — ABNORMAL LOW (ref 36.0–46.0)
Hemoglobin: 8.6 g/dL — ABNORMAL LOW (ref 12.0–15.0)
MCH: 32.6 pg (ref 26.0–34.0)
MCHC: 35 g/dL (ref 30.0–36.0)
MCV: 93.2 fL (ref 80.0–100.0)
Platelets: 115 10*3/uL — ABNORMAL LOW (ref 150–400)
RBC: 2.64 MIL/uL — ABNORMAL LOW (ref 3.87–5.11)
RDW: 14.5 % (ref 11.5–15.5)
WBC: 7.9 10*3/uL (ref 4.0–10.5)
nRBC: 0 % (ref 0.0–0.2)

## 2023-12-11 LAB — COMPREHENSIVE METABOLIC PANEL
ALT: 17 U/L (ref 0–44)
AST: 23 U/L (ref 15–41)
Albumin: 1.7 g/dL — ABNORMAL LOW (ref 3.5–5.0)
Alkaline Phosphatase: 45 U/L (ref 38–126)
Anion gap: 10 (ref 5–15)
BUN: 5 mg/dL — ABNORMAL LOW (ref 8–23)
CO2: 21 mmol/L — ABNORMAL LOW (ref 22–32)
Calcium: 7.2 mg/dL — ABNORMAL LOW (ref 8.9–10.3)
Chloride: 110 mmol/L (ref 98–111)
Creatinine, Ser: 0.76 mg/dL (ref 0.44–1.00)
GFR, Estimated: >60 mL/min (ref >60)
Glucose, Bld: 232 mg/dL — ABNORMAL HIGH (ref 70–99)
Potassium: 3.2 mmol/L — ABNORMAL LOW (ref 3.5–5.1)
Sodium: 141 mmol/L (ref 135–145)
Total Bilirubin: 0.6 mg/dL (ref 0.0–1.2)
Total Protein: 3.8 g/dL — ABNORMAL LOW (ref 6.5–8.1)

## 2023-12-11 LAB — GLUCOSE, CAPILLARY
Glucose-Capillary: 173 mg/dL — ABNORMAL HIGH (ref 70–99)
Glucose-Capillary: 178 mg/dL — ABNORMAL HIGH (ref 70–99)
Glucose-Capillary: 203 mg/dL — ABNORMAL HIGH (ref 70–99)
Glucose-Capillary: 213 mg/dL — ABNORMAL HIGH (ref 70–99)
Glucose-Capillary: 225 mg/dL — ABNORMAL HIGH (ref 70–99)

## 2023-12-11 LAB — CK: Total CK: 114 U/L (ref 38–234)

## 2023-12-11 MED ORDER — DAPTOMYCIN-SODIUM CHLORIDE 500-0.9 MG/50ML-% IV SOLN
500.0000 mg | Freq: Every day | INTRAVENOUS | Status: DC
  Administered 2023-12-11 – 2023-12-18 (×7): 500 mg via INTRAVENOUS
  Filled 2023-12-11 (×9): qty 50

## 2023-12-11 MED ORDER — PROPOFOL 1000 MG/100ML IV EMUL
INTRAVENOUS | Status: AC
  Filled 2023-12-11: qty 100

## 2023-12-11 MED ORDER — SODIUM CHLORIDE 0.9 % IV SOLN: INTRAVENOUS | Status: AC | PRN

## 2023-12-11 MED ORDER — PHENYLEPHRINE HCL-NACL 20-0.9 MG/250ML-% IV SOLN
INTRAVENOUS | Status: AC
Start: 2023-12-11 — End: ?
  Filled 2023-12-11: qty 500

## 2023-12-11 MED ORDER — INSULIN GLARGINE-YFGN 100 UNIT/ML ~~LOC~~ SOLN
5.0000 [IU] | Freq: Every day | SUBCUTANEOUS | Status: DC
  Administered 2023-12-11 – 2023-12-15 (×4): 5 [IU] via SUBCUTANEOUS
  Filled 2023-12-11 (×5): qty 0.05

## 2023-12-11 MED ORDER — POTASSIUM CHLORIDE CRYS ER 20 MEQ PO TBCR
40.0000 meq | EXTENDED_RELEASE_TABLET | Freq: Once | ORAL | Status: AC
  Administered 2023-12-11: 40 meq via ORAL
  Filled 2023-12-11: qty 2

## 2023-12-11 NOTE — Progress Notes (Signed)
[facility]   Reason for visit: Follow-up on MRSA bacteremia with graft infection  Interval History: Resected graft culture with MRSA.  Physical Exam: Constitutional:  Vitals:   12/11/23 0807 12/11/23 1152  BP: (!) 99/55 128/77  Pulse: 69 80  Resp: 13 20  Temp: 98.5 F (36.9 C) 98.5 F (36.9 C)  SpO2: 98% 100%   patient appears in NAD Respiratory: Normal respiratory effort  Review of Systems: Constitutional: negative for fevers and chills  Lab Results  Component Value Date   WBC 7.9 12/11/2023   HGB 8.6 (L) 12/11/2023   HCT 24.6 (L) 12/11/2023   MCV 93.2 12/11/2023   PLT 115 (L) 12/11/2023    Lab Results  Component Value Date   CREATININE 0.76 12/11/2023   BUN 5 (L) 12/11/2023   NA 141 12/11/2023   K 3.2 (L) 12/11/2023   CL 110 12/11/2023   CO2 21 (L) 12/11/2023    Lab Results  Component Value Date   ALT 17 12/11/2023   AST 23 12/11/2023   ALKPHOS 45 12/11/2023     Microbiology: Recent Results (from the past 240 hours)  Culture, blood (Routine x 2)     Status: Abnormal (Preliminary result)   Collection Time: 12/07/23  8:17 PM   Specimen: BLOOD  Result Value Ref Range Status   Specimen Description BLOOD LEFT ANTECUBITAL  Final   Special Requests   Final    BOTTLES DRAWN AEROBIC AND ANAEROBIC Blood Culture results may not be optimal due to an inadequate volume of blood received in culture bottles   Culture  Setup Time   Final    GRAM POSITIVE COCCI ANAEROBIC BOTTLE ONLY CRITICAL RESULT CALLED TO, READ BACK BY AND VERIFIED WITH: PHARMD JESSICA MILLEN [id number] AT 1411 BY EC    Culture (A)  Final    METHICILLIN RESISTANT STAPHYLOCOCCUS AUREUS CULTURE REINCUBATED FOR BETTER GROWTH Performed at [facility], [address] [place], [place] [address]    Report Status PENDING  Incomplete   Organism ID, Bacteria METHICILLIN RESISTANT STAPHYLOCOCCUS AUREUS  Final      Susceptibility   Methicillin resistant staphylococcus  aureus - MIC*    CIPROFLOXACIN >=8 RESISTANT Resistant     ERYTHROMYCIN <=0.25 SENSITIVE Sensitive     GENTAMICIN <=0.5 SENSITIVE Sensitive     OXACILLIN >=4 RESISTANT Resistant     TETRACYCLINE <=1 SENSITIVE Sensitive     VANCOMYCIN <=0.5 SENSITIVE Sensitive     TRIMETH/SULFA <=10 SENSITIVE Sensitive     CLINDAMYCIN <=0.25 SENSITIVE Sensitive     RIFAMPIN <=0.5 SENSITIVE Sensitive     Inducible Clindamycin NEGATIVE Sensitive     LINEZOLID 2 SENSITIVE Sensitive     * METHICILLIN RESISTANT STAPHYLOCOCCUS AUREUS  Blood Culture ID Panel (Reflexed)     Status: Abnormal   Collection Time: 12/07/23  8:17 PM  Result Value Ref Range Status   Enterococcus faecalis NOT DETECTED NOT DETECTED Final   Enterococcus Faecium NOT DETECTED NOT DETECTED Final   Listeria monocytogenes NOT DETECTED NOT DETECTED Final   Staphylococcus species DETECTED (A) NOT DETECTED Final    Comment: CRITICAL RESULT CALLED TO, READ BACK BY AND VERIFIED WITH: PHARMD JESSICA MILLEN [id number] AT 1411 BY EC    Staphylococcus aureus (BCID) DETECTED (A) NOT DETECTED Final    Comment: Methicillin (oxacillin)-resistant Staphylococcus aureus (MRSA). MRSA is predictably resistant to beta-lactam antibiotics (except ceftaroline). Preferred therapy is vancomycin unless clinically contraindicated. Patient requires contact precautions if  hospitalized. CRITICAL RESULT  CALLED TO, READ BACK BY AND VERIFIED WITH: PHARMD JESSICA MILLEN [id number] AT 1411 BY EC    Staphylococcus epidermidis NOT DETECTED NOT DETECTED Final   Staphylococcus lugdunensis NOT DETECTED NOT DETECTED Final   Streptococcus species NOT DETECTED NOT DETECTED Final   Streptococcus agalactiae NOT DETECTED NOT DETECTED Final   Streptococcus pneumoniae NOT DETECTED NOT DETECTED Final   Streptococcus pyogenes NOT DETECTED NOT DETECTED Final   A.calcoaceticus-baumannii NOT DETECTED NOT DETECTED Final   Bacteroides fragilis NOT DETECTED NOT DETECTED Final    Enterobacterales NOT DETECTED NOT DETECTED Final   Enterobacter cloacae complex NOT DETECTED NOT DETECTED Final   Escherichia coli NOT DETECTED NOT DETECTED Final   Klebsiella aerogenes NOT DETECTED NOT DETECTED Final   Klebsiella oxytoca NOT DETECTED NOT DETECTED Final   Klebsiella pneumoniae NOT DETECTED NOT DETECTED Final   Proteus species NOT DETECTED NOT DETECTED Final   Salmonella species NOT DETECTED NOT DETECTED Final   Serratia marcescens NOT DETECTED NOT DETECTED Final   Haemophilus influenzae NOT DETECTED NOT DETECTED Final   Neisseria meningitidis NOT DETECTED NOT DETECTED Final   Pseudomonas aeruginosa NOT DETECTED NOT DETECTED Final   Stenotrophomonas maltophilia NOT DETECTED NOT DETECTED Final   Candida albicans NOT DETECTED NOT DETECTED Final   Candida auris NOT DETECTED NOT DETECTED Final   Candida glabrata NOT DETECTED NOT DETECTED Final   Candida krusei NOT DETECTED NOT DETECTED Final   Candida parapsilosis NOT DETECTED NOT DETECTED Final   Candida tropicalis NOT DETECTED NOT DETECTED Final   Cryptococcus neoformans/gattii NOT DETECTED NOT DETECTED Final   Meth resistant mecA/C and MREJ DETECTED (A) NOT DETECTED Final    Comment: CRITICAL RESULT CALLED TO, READ BACK BY AND VERIFIED WITH: PHARMD JESSICA MILLEN [id number] AT 1411 BY EC Performed at [facility], [address] [place], [place] [address]   Culture, blood (Routine x 2)     Status: None (Preliminary result)   Collection Time: 12/07/23  8:20 PM   Specimen: BLOOD RIGHT ARM  Result Value Ref Range Status   Specimen Description BLOOD RIGHT ARM  Final   Special Requests   Final    BOTTLES DRAWN AEROBIC AND ANAEROBIC Blood Culture results may not be optimal due to an inadequate volume of blood received in culture bottles   Culture   Final    NO GROWTH 4 DAYS Performed at [facility], [address], [place], [place] [address]    Report Status PENDING  Incomplete  Surgical pcr screen      Status: Abnormal   Collection Time: 12/08/23  7:57 PM   Specimen: Nasal Mucosa; Nasal Swab  Result Value Ref Range Status   MRSA, PCR POSITIVE (A) NEGATIVE Final    Comment: RESULT CALLED TO, READ BACK BY AND VERIFIED WITH: T IRBY,RN@2258  12/08/23 MK    Staphylococcus aureus POSITIVE (A) NEGATIVE Final    Comment: (NOTE) The Xpert SA Assay (FDA approved for NASAL specimens in patients 73 years of age and older), is one component of a comprehensive surveillance program. It is not intended to diagnose infection nor to guide or monitor treatment. Performed at [facility], [address] [place], [place] [address]   Culture, blood (Routine X 2) w Reflex to ID Panel     Status: None (Preliminary result)   Collection Time: 12/09/23  3:07 AM   Specimen: BLOOD RIGHT ARM  Result Value Ref Range Status   Specimen Description BLOOD RIGHT ARM  Final  Special Requests   Final    BOTTLES DRAWN AEROBIC AND ANAEROBIC Blood Culture results may not be optimal due to an inadequate volume of blood received in culture bottles   Culture   Final    NO GROWTH 2 DAYS Performed at [facility], [address] [place], [place] [address]    Report Status PENDING  Incomplete  Culture, blood (Routine X 2) w Reflex to ID Panel     Status: None (Preliminary result)   Collection Time: 12/09/23  3:07 AM   Specimen: BLOOD RIGHT HAND  Result Value Ref Range Status   Specimen Description BLOOD RIGHT HAND  Final   Special Requests   Final    BOTTLES DRAWN AEROBIC AND ANAEROBIC Blood Culture results may not be optimal due to an inadequate volume of blood received in culture bottles   Culture   Final    NO GROWTH 2 DAYS Performed at [facility], [address] [place], [place] [address]    Report Status PENDING  Incomplete  Aerobic/Anaerobic Culture w Gram Stain (surgical/deep wound)     Status: None (Preliminary result)   Collection Time: 12/09/23 10:16 AM   Specimen: Wound  Result Value  Ref Range Status   Specimen Description WOUND  Final   Special Requests RIGHT GROIN  Final   Gram Stain   Final    NO WBC SEEN RARE GRAM POSITIVE COCCI IN PAIRS IN SINGLES Performed at [facility], [address], [place], [place] [address]    Culture   Final    MODERATE STAPHYLOCOCCUS AUREUS SUSCEPTIBILITIES PERFORMED ON PREVIOUS CULTURE WITHIN THE LAST 5 DAYS. NO ANAEROBES ISOLATED; CULTURE IN PROGRESS FOR 5 DAYS    Report Status PENDING  Incomplete  Aerobic/Anaerobic Culture w Gram Stain (surgical/deep wound)     Status: None (Preliminary result)   Collection Time: 12/09/23 10:18 AM   Specimen: Wound  Result Value Ref Range Status   Specimen Description WOUND  Final   Special Requests portion of graft from right groin  Final   Gram Stain   Final    MODERATE WBC PRESENT,BOTH PMN AND MONONUCLEAR FEW GRAM POSITIVE COCCI IN PAIRS Performed at [facility], [address], [place], [place] [address]    Culture   Final    ABUNDANT METHICILLIN RESISTANT STAPHYLOCOCCUS AUREUS NO ANAEROBES ISOLATED; CULTURE IN PROGRESS FOR 5 DAYS    Report Status PENDING  Incomplete   Organism ID, Bacteria METHICILLIN RESISTANT STAPHYLOCOCCUS AUREUS  Final      Susceptibility   Methicillin resistant staphylococcus aureus - MIC*    CIPROFLOXACIN >=8 RESISTANT Resistant     ERYTHROMYCIN <=0.25 SENSITIVE Sensitive     GENTAMICIN <=0.5 SENSITIVE Sensitive     OXACILLIN >=4 RESISTANT Resistant     TETRACYCLINE <=1 SENSITIVE Sensitive     VANCOMYCIN <=0.5 SENSITIVE Sensitive     TRIMETH/SULFA <=10 SENSITIVE Sensitive     CLINDAMYCIN <=0.25 SENSITIVE Sensitive     RIFAMPIN <=0.5 SENSITIVE Sensitive     Inducible Clindamycin NEGATIVE Sensitive     LINEZOLID 2 SENSITIVE Sensitive     * ABUNDANT METHICILLIN RESISTANT STAPHYLOCOCCUS AUREUS  Aerobic/Anaerobic Culture w Gram Stain (surgical/deep wound)     Status: None   Collection Time: 12/09/23 10:23 AM   Specimen: Wound  Result Value Ref  Range Status   Specimen Description WOUND  Final   Special Requests DISTAL PORTION OF GRAFT FROM RIGHT GROIN  Final   Gram Stain  Final    RARE WBC PRESENT, PREDOMINANTLY MONONUCLEAR NO ORGANISMS SEEN Performed at [facility], [address], [place], [place] [address]    Culture   Final    FEW STAPHYLOCOCCUS AUREUS SUSCEPTIBILITIES PERFORMED ON PREVIOUS CULTURE WITHIN THE LAST 5 DAYS. NO ANAEROBES ISOLATED; CULTURE IN PROGRESS FOR 5 DAYS    Report Status 12/11/2023 FINAL  Final    Impression/Plan:  1.  MRSA bacteremia with graft infection.  She is on daptomycin and will continue with this for the bacteremia and graft infection.  The source has been removed.  TTE noted and no concerning findings for endocarditis.  I have recommended a TEE Will continue Ainge antibiotics to daptomycin  2.  Medication monitoring.  Will continue to monitor CK levels today is within normal limits.  3.  Access.  Repeat blood cultures now negative for 2 days.  If they remain negative, okay from an infectious disease standpoint for PICC line tomorrow

## 2023-12-11 NOTE — Progress Notes (Signed)
Physical Therapy Treatment Patient Details Name: Jill Higgins MRN: [id number] DOB: [birth year] Today's Date: 12/11/2023   History of Present Illness The pt is a 78 yo female presenting 1/17 with drainage from R groin incision (recent R axillary - femoral bypass on 12/26). Now s/p redo R axilla - femoral artery bypass with wound vac placement 1/19. PMH includes: ight common femoral endarterectomy with right femoral to above-knee popliteal bypass, stenting of the right iliac and common femoral arteries, bronchitis, bulging disc, depression w/anxiety, DM II, fibromyalgia, GERD, glaucoma, hyperlipidemia, HTN, PVD, and sleep apnea.    PT Comments  Pt seated up EOB with RN on arrival and agreeable to session with slow but steady progress towards acute goals. Pt requiring mod A to rise to stand from EOB fading to min A with UE support on BSC arm rests. Pt able to progress gait for short in room distance with min A to manage RW and provide steadying assist. Pt with poor safety awareness needing repeated cues for hand placement during transfers and on RW (pt leaning on forearms due to groin pain). Pt continues to be limited by pain, decreased activity tolerance, poor balance.postural reactions and decreased insight in deficits and safety. Current plan remains appropriate, however pt may benefit from continued inpatient follow up therapy, <3 hours/day pending continued progress and assist available at home. Pt continues to benefit from skilled PT services to progress toward functional mobility goals.      If plan is discharge home, recommend the following: Help with stairs or ramp for entrance;Assist for transportation;Assistance with cooking/housework;A lot of help with walking and/or transfers;A little help with bathing/dressing/bathroom   Can travel by private vehicle        Equipment Recommendations  None recommended by PT    Recommendations for Other Services       Precautions / Restrictions  Precautions Precautions: Fall Restrictions Weight Bearing Restrictions Per Provider Order: No     Mobility  Bed Mobility Overal bed mobility: Needs Assistance Bed Mobility: Sit to Supine     Supine to sit: Min assist Sit to supine: Min assist   General bed mobility comments: min A with LE mgt due to pain; increased time    Transfers Overall transfer level: Needs assistance Equipment used: 1 person hand held assist, Rolling walker (2 wheels) Transfers: Sit to/from Stand, Bed to chair/wheelchair/BSC Sit to Stand: Mod assist, Min assist   Step pivot transfers: Mod assist       General transfer comment: modA to rise and steady from EOB, pt bracing LE on bed. small staggering steps due to pain to step around to [facility], min A to rise from [facility] x2 with cues for hand placement with poor carryover    Ambulation/Gait Ambulation/Gait assistance: Min assist Gait Distance (Feet): 8 Feet Assistive device: Rolling walker (2 wheels) Gait Pattern/deviations: Step-to pattern, Decreased step length - right, Decreased step length - left, Trunk flexed, Narrow base of support Gait velocity: very slowed     General Gait Details: short gait in room with very flexed trunk and short shuffling steps, unable to elevate trunk due to pain, min A to steady and manage RW   Stairs             Wheelchair Mobility     Tilt Bed    Modified Rankin (Stroke Patients Only)       Balance Overall balance assessment: Needs assistance Sitting-balance support: No upper extremity supported, Feet supported Sitting balance-Leahy Scale: Good  Standing balance support: During functional activity, Single extremity supported Standing balance-Leahy Scale: Poor Standing balance comment: would benefit from BUE support                            Cognition Arousal: Alert Behavior During Therapy: WFL for tasks assessed/performed Overall Cognitive Status: Within Functional Limits for tasks  assessed                                 General Comments: some slow responses and slow processing        Exercises      General Comments General comments (skin integrity, edema, etc.): VSS on RA, RN present in room throughout session      Pertinent Vitals/Pain Pain Assessment Pain Assessment: Faces Faces Pain Scale: Hurts even more Pain Location: R groin Pain Descriptors / Indicators: Discomfort, Grimacing, Sore Pain Intervention(s): Patient requesting pain meds-RN notified, Monitored during session, Limited activity within patient's tolerance    Home Living                          Prior Function            PT Goals (current goals can now be found in the care plan section) Acute Rehab PT Goals Patient Stated Goal: home when able PT Goal Formulation: With patient Time For Goal Achievement: 12/24/23 Progress towards PT goals: Progressing toward goals (very slowly)    Frequency    Min 1X/week      PT Plan      Co-evaluation              AM-PAC PT "6 Clicks" Mobility   Outcome Measure  Help needed turning from your back to your side while in a flat bed without using bedrails?: A Little Help needed moving from lying on your back to sitting on the side of a flat bed without using bedrails?: A Little Help needed moving to and from a bed to a chair (including a wheelchair)?: A Little Help needed standing up from a chair using your arms (e.g., wheelchair or bedside chair)?: A Little Help needed to walk in hospital room?: Total (<20 ft) Help needed climbing 3-5 steps with a railing? : Total 6 Click Score: 14    End of Session Equipment Utilized During Treatment: Gait belt Activity Tolerance: Patient limited by fatigue;Patient limited by pain Patient left: with call bell/phone within reach;in bed;with nursing/sitter in room Nurse Communication: Mobility status PT Visit Diagnosis: Unsteadiness on feet (R26.81);Other abnormalities of  gait and mobility (R26.89)     Time: 8657-8469 PT Time Calculation (min) (ACUTE ONLY): 25 min  Charges:    $Gait Training: 8-22 mins $Therapeutic Activity: 8-22 mins PT General Charges $$ ACUTE PT VISIT: 1 Visit                     Scotti Kosta R. PTA Acute Rehabilitation Services Office: [phone]   Catalina Antigua 12/11/2023, 5:18 PM

## 2023-12-11 NOTE — Progress Notes (Signed)
Foley cath d/ced per order.  Removed 10cc sterile water from cath.balloon prior to removal.    Patient d/ced removal without distress. Explained guidelines for process following removal of cath. - need to void within 6-8 hours, and if unable to do so, MD to be notified.  Fresh water placed at her bedsside

## 2023-12-11 NOTE — Progress Notes (Signed)
[facility]  Subjective  -no complaints but sleepy.   Objective 105/61 75 98.5 F (36.9 C) (Oral) 12 96%  Intake/Output Summary (Last 24 hours) at 12/11/2023 0747 Last data filed at 12/11/2023 0630 Gross per 24 hour  Intake 120 ml  Output 940 ml  Net -820 ml    Right chest wall incision with staples and no hematoma Right radial pulse palpable Right groin with VAC with seal PT and DP monophasic and right foot motor/sensory intact  Laboratory Lab Results: Recent Labs    12/10/23 0831 12/11/23 0335  WBC 11.7* 7.9  HGB 10.0* 8.6*  HCT 28.4* 24.6*  PLT 108* 115*   BMET Recent Labs    12/10/23 0355 12/11/23 0335  NA 137 141  K 3.7 3.2*  CL 107 110  CO2 24 21*  GLUCOSE 117* 232*  BUN 7* 5*  CREATININE 0.71 0.76  CALCIUM 7.4* 7.2*    COAG Lab Results  Component Value Date   INR 1.3 (H) 12/07/2023   INR 0.9 10/23/2023   INR 0.9 04/20/2023   No results found for: "PTT"  Assessment/Planning:  Postop day 2 status post excision of infected right axillofemoral bypass with vein patch of the right axillary artery and femoral artery onto the bypass.  Will leave drain given output of over 100 in last 24 hours.  Chest wall incision looks good and dressing removed.  Palpable radial pulse at the right wrist.  Doppler flow in the right foot and is motor  sensory intact.  Gram-positive cocci with staph growing from the graft removed.  She remains on daptomycin and ID following.  Appreciate their assistance.  Cephus Shelling 12/11/2023 7:47 AM --

## 2023-12-11 NOTE — Progress Notes (Signed)
PROGRESS NOTE    Jill Higgins  ZOX:[id number] DOB: [birth year] DOA: 12/07/2023 PCP: Merri Brunette, MD   Brief Narrative: Jill Higgins is a 78 y.o. female with a history of hypertension, diabetes mellitus type 2, depression, anxiety, PAD with thrombosis of right extremity iliac/common femoral and disabling claudication status post axillofemoral bypass graft.  Patient presented secondary to discharge from groin incision with associated chills concerning for evidence of infection.  Patient met sepsis criteria on admission to start empiric antibiotics.  Blood cultures obtained prior to antibiotics.  Vascular surgery consulted with plan for surgical management.   Assessment/Plan:  Severe sepsis Present on admission with associated lactic acidosis. Secondary to graft infection. Blood cultures (1/17) obtained on admission and are significant for MRSA. Empiric Vancomycin and Cefepime started and transitioned to Vancomycin monotherapy. ID consulted.  Right axillo-femoral bypass graft infection Present on admission. Surgery performed on 12/26. Vascular surgery consulted and are following. Patient started empirically on Vancomycin and Cefepime. WBC of 24,900 on admission is improving. Cefepime discontinued. Patient underwent removal of bypass on 1/19; culture significant for GPCs -Continue Vancomycin -Follow-up graft culture data  MRSA bacteremia Source is likely right groin wound. ID consulted. Patient is managed on Vancomycin. Repeat blood cultures (1/19) obtained and are no growth to date. Transthoracic Echocardiogram significant for no evidence of valvular vegetations. -ID recommendations: Vancomycin (plan for 4-6 weeks), Transesophageal Echocardiogram -Consult cardiology for Transesophageal Echocardiogram consideration  PAD S/p axillo-femoral bypass graft. Patient follows with vascular surgery as an outpatient. LDL is down to 15. Patient is on Lipitor, aspirin and Plavix. -Continue  Lipitor, aspirin and Plavix  Primary hypertension Patient is on amlodipine, losartan and Toprol Xl as an outpatient. Antihypertensives held on admission secondary to hypotension.  Diabetes mellitus, type 2 Uncontrolled with hyperglycemia. Last hemoglobin A1C of 9.5%. Patient is managed on insulin glargine, insulin aspart, and metformin as an outpatient and started on SSI on admission. Blood sugar low-normal initially, now worsening. -Continue SSI -Start Semglee 5 units daily  Acute blood loss anemia Perioperative blood loss Secondary to vascular surgery. Patient with hemoglobin down to 7.1 g/dL requiring a transfusion of 2 units of PRBC. Posttransfusion hemoglobin of 10, down to 8.6 today -Trend CBC  Hypokalemia Potassium supplementation  Depression Anxiety -Continue Cymbalta  Insomnia -Continue Restoril   DVT prophylaxis: SCDs Code Status:   Code Status: Full Code Family Communication: None at bedside Disposition Plan: Discharge pending ongoing vascular surgery recommendations, ID recommendations, transition to outpatient antibiotics and eventual PT/OT recommendations   Consultants:  Vascular surgery Infectious disease  Procedures:  1/19: Vascular surgery Harvest of left arm basilic vein Redo exposure of right axillary artery Excision of right axillary artery to femoral artery bypass Vein patch angioplasty right axillary artery Vein patch angioplasty of right femoral artery distal bypass Vac right groin  Antimicrobials: Vancomycin Cefepime   Subjective: Feels okay. No concerns. Tired.  Objective: BP (!) 99/55 (BP Location: Right Arm)   Pulse 69   Temp 98.5 F (36.9 C) (Oral)   Resp 13   Ht 5' 2.99" (1.6 m)   Wt 50 kg   SpO2 98%   BMI 19.53 kg/m   Examination:  General exam: Appears calm and comfortable Respiratory system: Clear to auscultation. Respiratory effort normal. Cardiovascular system: S1 & S2 heard, RRR. No murmurs. Gastrointestinal  system: Abdomen is nondistended, soft and nontender. Normal bowel sounds heard. Central nervous system: Sleepy, but arouses easily Musculoskeletal: No edema. No calf tenderness   Data Reviewed: I have personally  reviewed following labs and imaging studies   Last CBC Lab Results  Component Value Date   WBC 7.9 12/11/2023   HGB 8.6 (L) 12/11/2023   HCT 24.6 (L) 12/11/2023   MCV 93.2 12/11/2023   MCH 32.6 12/11/2023   RDW 14.5 12/11/2023   PLT 115 (L) 12/11/2023     Last metabolic panel Lab Results  Component Value Date   GLUCOSE 232 (H) 12/11/2023   NA 141 12/11/2023   K 3.2 (L) 12/11/2023   CL 110 12/11/2023   CO2 21 (L) 12/11/2023   BUN 5 (L) 12/11/2023   CREATININE 0.76 12/11/2023   GFRNONAA >60 12/11/2023   CALCIUM 7.2 (L) 12/11/2023   PROT 3.8 (L) 12/11/2023   ALBUMIN 1.7 (L) 12/11/2023   BILITOT 0.6 12/11/2023   ALKPHOS 45 12/11/2023   AST 23 12/11/2023   ALT 17 12/11/2023   ANIONGAP 10 12/11/2023     Creatinine Clearance: Estimated Creatinine Clearance: 45.7 mL/min (by C-G formula based on SCr of 0.76 mg/dL).  Recent Results (from the past 240 hours)  Culture, blood (Routine x 2)     Status: Abnormal   Collection Time: 12/07/23  8:17 PM   Specimen: BLOOD  Result Value Ref Range Status   Specimen Description BLOOD LEFT ANTECUBITAL  Final   Special Requests   Final    BOTTLES DRAWN AEROBIC AND ANAEROBIC Blood Culture results may not be optimal due to an inadequate volume of blood received in culture bottles   Culture  Setup Time   Final    GRAM POSITIVE COCCI ANAEROBIC BOTTLE ONLY CRITICAL RESULT CALLED TO, READ BACK BY AND VERIFIED WITH: PHARMD JESSICA MILLEN [phone] AT 1411 BY EC Performed at [facility], [address] [place], [place] [address]    Culture METHICILLIN RESISTANT STAPHYLOCOCCUS AUREUS (A)  Final   Report Status 12/10/2023 FINAL  Final   Organism ID, Bacteria METHICILLIN RESISTANT STAPHYLOCOCCUS AUREUS  Final       Susceptibility   Methicillin resistant staphylococcus aureus - MIC*    CIPROFLOXACIN >=8 RESISTANT Resistant     ERYTHROMYCIN <=0.25 SENSITIVE Sensitive     GENTAMICIN <=0.5 SENSITIVE Sensitive     OXACILLIN >=4 RESISTANT Resistant     TETRACYCLINE <=1 SENSITIVE Sensitive     VANCOMYCIN <=0.5 SENSITIVE Sensitive     TRIMETH/SULFA <=10 SENSITIVE Sensitive     CLINDAMYCIN <=0.25 SENSITIVE Sensitive     RIFAMPIN <=0.5 SENSITIVE Sensitive     Inducible Clindamycin NEGATIVE Sensitive     LINEZOLID 2 SENSITIVE Sensitive     * METHICILLIN RESISTANT STAPHYLOCOCCUS AUREUS  Blood Culture ID Panel (Reflexed)     Status: Abnormal   Collection Time: 12/07/23  8:17 PM  Result Value Ref Range Status   Enterococcus faecalis NOT DETECTED NOT DETECTED Final   Enterococcus Faecium NOT DETECTED NOT DETECTED Final   Listeria monocytogenes NOT DETECTED NOT DETECTED Final   Staphylococcus species DETECTED (A) NOT DETECTED Final    Comment: CRITICAL RESULT CALLED TO, READ BACK BY AND VERIFIED WITH: PHARMD JESSICA MILLEN [phone] AT 1411 BY EC    Staphylococcus aureus (BCID) DETECTED (A) NOT DETECTED Final    Comment: Methicillin (oxacillin)-resistant Staphylococcus aureus (MRSA). MRSA is predictably resistant to beta-lactam antibiotics (except ceftaroline). Preferred therapy is vancomycin unless clinically contraindicated. Patient requires contact precautions if  hospitalized. CRITICAL RESULT CALLED TO, READ BACK BY AND VERIFIED WITH: PHARMD JESSICA MILLEN [id number] AT 1411 BY EC    Staphylococcus epidermidis NOT DETECTED NOT DETECTED Final  Staphylococcus lugdunensis NOT DETECTED NOT DETECTED Final   Streptococcus species NOT DETECTED NOT DETECTED Final   Streptococcus agalactiae NOT DETECTED NOT DETECTED Final   Streptococcus pneumoniae NOT DETECTED NOT DETECTED Final   Streptococcus pyogenes NOT DETECTED NOT DETECTED Final   A.calcoaceticus-baumannii NOT DETECTED NOT DETECTED Final   Bacteroides  fragilis NOT DETECTED NOT DETECTED Final   Enterobacterales NOT DETECTED NOT DETECTED Final   Enterobacter cloacae complex NOT DETECTED NOT DETECTED Final   Escherichia coli NOT DETECTED NOT DETECTED Final   Klebsiella aerogenes NOT DETECTED NOT DETECTED Final   Klebsiella oxytoca NOT DETECTED NOT DETECTED Final   Klebsiella pneumoniae NOT DETECTED NOT DETECTED Final   Proteus species NOT DETECTED NOT DETECTED Final   Salmonella species NOT DETECTED NOT DETECTED Final   Serratia marcescens NOT DETECTED NOT DETECTED Final   Haemophilus influenzae NOT DETECTED NOT DETECTED Final   Neisseria meningitidis NOT DETECTED NOT DETECTED Final   Pseudomonas aeruginosa NOT DETECTED NOT DETECTED Final   Stenotrophomonas maltophilia NOT DETECTED NOT DETECTED Final   Candida albicans NOT DETECTED NOT DETECTED Final   Candida auris NOT DETECTED NOT DETECTED Final   Candida glabrata NOT DETECTED NOT DETECTED Final   Candida krusei NOT DETECTED NOT DETECTED Final   Candida parapsilosis NOT DETECTED NOT DETECTED Final   Candida tropicalis NOT DETECTED NOT DETECTED Final   Cryptococcus neoformans/gattii NOT DETECTED NOT DETECTED Final   Meth resistant mecA/C and MREJ DETECTED (A) NOT DETECTED Final    Comment: CRITICAL RESULT CALLED TO, READ BACK BY AND VERIFIED WITH: PHARMD JESSICA MILLEN [phone] AT 1411 BY EC Performed at [facility], [address], [place], [place] [address]   Culture, blood (Routine x 2)     Status: None (Preliminary result)   Collection Time: 12/07/23  8:20 PM   Specimen: BLOOD RIGHT ARM  Result Value Ref Range Status   Specimen Description BLOOD RIGHT ARM  Final   Special Requests   Final    BOTTLES DRAWN AEROBIC AND ANAEROBIC Blood Culture results may not be optimal due to an inadequate volume of blood received in culture bottles   Culture   Final    NO GROWTH 3 DAYS Performed at [facility], [address], [place], [place] [address]    Report Status PENDING   Incomplete  Surgical pcr screen     Status: Abnormal   Collection Time: 12/08/23  7:57 PM   Specimen: Nasal Mucosa; Nasal Swab  Result Value Ref Range Status   MRSA, PCR POSITIVE (A) NEGATIVE Final    Comment: RESULT CALLED TO, READ BACK BY AND VERIFIED WITH: T IRBY,RN@2258  12/08/23 MK    Staphylococcus aureus POSITIVE (A) NEGATIVE Final    Comment: (NOTE) The Xpert SA Assay (FDA approved for NASAL specimens in patients 47 years of age and older), is one component of a comprehensive surveillance program. It is not intended to diagnose infection nor to guide or monitor treatment. Performed at [facility], [address], [place], [place] [address]   Culture, blood (Routine X 2) w Reflex to ID Panel     Status: None (Preliminary result)   Collection Time: 12/09/23  3:07 AM   Specimen: BLOOD RIGHT ARM  Result Value Ref Range Status   Specimen Description BLOOD RIGHT ARM  Final   Special Requests   Final    BOTTLES DRAWN AEROBIC AND ANAEROBIC Blood Culture results may not be optimal due to an inadequate volume of blood received  in culture bottles   Culture   Final    NO GROWTH 1 DAY Performed at [facility], [address], [place], [place] [address]    Report Status PENDING  Incomplete  Culture, blood (Routine X 2) w Reflex to ID Panel     Status: None (Preliminary result)   Collection Time: 12/09/23  3:07 AM   Specimen: BLOOD RIGHT HAND  Result Value Ref Range Status   Specimen Description BLOOD RIGHT HAND  Final   Special Requests   Final    BOTTLES DRAWN AEROBIC AND ANAEROBIC Blood Culture results may not be optimal due to an inadequate volume of blood received in culture bottles   Culture   Final    NO GROWTH 1 DAY Performed at [facility], [address], [place], [place] [address]    Report Status PENDING  Incomplete  Aerobic/Anaerobic Culture w Gram Stain (surgical/deep wound)     Status: None (Preliminary result)   Collection Time: 12/09/23 10:16  AM   Specimen: Wound  Result Value Ref Range Status   Specimen Description WOUND  Final   Special Requests RIGHT GROIN  Final   Gram Stain   Final    NO WBC SEEN RARE GRAM POSITIVE COCCI IN PAIRS IN SINGLES Performed at [facility], [address], [place], [place] [address]    Culture MODERATE STAPHYLOCOCCUS AUREUS  Final   Report Status PENDING  Incomplete  Aerobic/Anaerobic Culture w Gram Stain (surgical/deep wound)     Status: None (Preliminary result)   Collection Time: 12/09/23 10:18 AM   Specimen: Wound  Result Value Ref Range Status   Specimen Description WOUND  Final   Special Requests portion of graft from right groin  Final   Gram Stain   Final    MODERATE WBC PRESENT,BOTH PMN AND MONONUCLEAR FEW GRAM POSITIVE COCCI IN PAIRS    Culture   Final    ABUNDANT STAPHYLOCOCCUS AUREUS SUSCEPTIBILITIES TO FOLLOW Performed at [facility], [address], [place], [place] [address]    Report Status PENDING  Incomplete  Aerobic/Anaerobic Culture w Gram Stain (surgical/deep wound)     Status: None (Preliminary result)   Collection Time: 12/09/23 10:23 AM   Specimen: Wound  Result Value Ref Range Status   Specimen Description WOUND  Final   Special Requests DISTAL PORTION OF GRAFT FROM RIGHT GROIN  Final   Gram Stain   Final    RARE WBC PRESENT, PREDOMINANTLY MONONUCLEAR NO ORGANISMS SEEN Performed at [facility], [address], [place], [place] [address]    Culture FEW STAPHYLOCOCCUS AUREUS  Final   Report Status PENDING  Incomplete      Radiology Studies: ECHOCARDIOGRAM COMPLETE Result Date: 12/10/2023    ECHOCARDIOGRAM REPORT   Patient Name:   Jill Higgins Date of Exam: 12/10/2023 Medical Rec #:  [id number]       Height:       63.0 in Accession #:    [id number]      Weight:       110.2 lb Date of Birth:  04-04-1946        BSA:          1.501 m Patient Age:    77 years        BP:           103/52 mmHg Patient Gender: F  HR:           70  bpm. Exam Location:  Inpatient Procedure: 2D Echo, Color Doppler, Cardiac Doppler, Strain Analysis and 3D Echo Indications:    Bactermia  History:        Patient has prior history of Echocardiogram examinations, most                 recent 12/31/2020. Signs/Symptoms:Dyspnea, Syncope and Chest                 Pain; Risk Factors:Dyslipidemia, Hypertension, Sleep Apnea,                 Current Smoker and Diabetes.  Sonographer:    Raeford Razor RDCS Referring Phys: [phone] SAMANTHA J RHYNE IMPRESSIONS  1. Left ventricular ejection fraction, by estimation, is 60 to 65%. Left ventricular ejection fraction by 3D volume is 58 %. The left ventricle has normal function. The left ventricle has no regional wall motion abnormalities. Left ventricular diastolic  parameters were normal. The average left ventricular global longitudinal strain is -23.3 %. The global longitudinal strain is normal.  2. Right ventricular systolic function is normal. The right ventricular size is normal. Tricuspid regurgitation signal is inadequate for assessing PA pressure.  3. The mitral valve is normal in structure. Trivial mitral valve regurgitation. No evidence of mitral stenosis.  4. The aortic valve is grossly normal. There is mild thickening of the aortic valve. Aortic valve regurgitation is not visualized. Aortic valve sclerosis is present, with no evidence of aortic valve stenosis.  5. The inferior vena cava is normal in size with greater than 50% respiratory variability, suggesting right atrial pressure of 3 mmHg. Conclusion(s)/Recommendation(s): No evidence of valvular vegetations on this transthoracic echocardiogram. Consider a transesophageal echocardiogram to exclude infective endocarditis if clinically indicated. FINDINGS  Left Ventricle: Left ventricular ejection fraction, by estimation, is 60 to 65%. Left ventricular ejection fraction by 3D volume is 58 %. The left ventricle has normal function. The left ventricle has no regional wall motion  abnormalities. The average left ventricular global longitudinal strain is -23.3 %. The global longitudinal strain is normal. The left ventricular internal cavity size was normal in size. There is no left ventricular hypertrophy. Left ventricular diastolic parameters were normal. Right Ventricle: The right ventricular size is normal. No increase in right ventricular wall thickness. Right ventricular systolic function is normal. Tricuspid regurgitation signal is inadequate for assessing PA pressure. Left Atrium: Left atrial size was normal in size. Right Atrium: Right atrial size was normal in size. Pericardium: There is no evidence of pericardial effusion. Mitral Valve: The mitral valve is normal in structure. Trivial mitral valve regurgitation. No evidence of mitral valve stenosis. Tricuspid Valve: The tricuspid valve is normal in structure. Tricuspid valve regurgitation is trivial. No evidence of tricuspid stenosis. Aortic Valve: The aortic valve is grossly normal. There is mild thickening of the aortic valve. Aortic valve regurgitation is not visualized. Aortic valve sclerosis is present, with no evidence of aortic valve stenosis. Aortic valve mean gradient measures 7.0 mmHg. Aortic valve peak gradient measures 13.5 mmHg. Aortic valve area, by VTI measures 1.34 cm. Pulmonic Valve: The pulmonic valve was normal in structure. Pulmonic valve regurgitation is trivial. No evidence of pulmonic stenosis. Aorta: The aortic root is normal in size and structure. Venous: The inferior vena cava is normal in size with greater than 50% respiratory variability, suggesting right atrial pressure of 3 mmHg. IAS/Shunts: No atrial level shunt detected by color flow Doppler.  LEFT VENTRICLE PLAX 2D LVIDd:         4.90 cm         Diastology LVIDs:         3.00 cm         LV e' medial:    8.38 cm/s LV PW:         0.80 cm         LV E/e' medial:  15.6 LV IVS:        0.80 cm         LV e' lateral:   14.70 cm/s LVOT diam:     1.70 cm          LV E/e' lateral: 8.9 LV SV:         54 LV SV Index:   36              2D LVOT Area:     2.27 cm        Longitudinal                                Strain                                2D Strain GLS  -23.3 %                                Avg:                                 3D Volume EF                                LV 3D EF:    Left                                             ventricul                                             ar                                             ejection                                             fraction                                             by 3D  volume is                                             58 %.                                 3D Volume EF:                                3D EF:        58 %                                LV EDV:       123 ml                                LV ESV:       52 ml                                LV SV:        71 ml RIGHT VENTRICLE             IVC RV Basal diam:  2.80 cm     IVC diam: 1.90 cm RV S prime:     17.70 cm/s TAPSE (M-mode): 2.3 cm LEFT ATRIUM             Index        RIGHT ATRIUM          Index LA diam:        2.50 cm 1.67 cm/m   RA Area:     8.40 cm LA Vol (A2C):   48.0 ml 31.98 ml/m  RA Volume:   15.30 ml 10.19 ml/m LA Vol (A4C):   46.4 ml 30.91 ml/m LA Biplane Vol: 51.0 ml 33.97 ml/m  AORTIC VALVE AV Area (Vmax):    1.32 cm AV Area (Vmean):   1.44 cm AV Area (VTI):     1.34 cm AV Vmax:           184.00 cm/s AV Vmean:          114.000 cm/s AV VTI:            0.399 m AV Peak Grad:      13.5 mmHg AV Mean Grad:      7.0 mmHg LVOT Vmax:         107.00 cm/s LVOT Vmean:        72.400 cm/s LVOT VTI:          0.236 m LVOT/AV VTI ratio: 0.59  AORTA Ao Root diam: 2.60 cm Ao Asc diam:  3.10 cm MITRAL VALVE MV Area (PHT): 3.72 cm     SHUNTS MV Decel Time: 204 msec     Systemic VTI:  0.24 m MV E velocity: 131.00 cm/s  Systemic Diam: 1.70 cm MV A velocity: 122.00 cm/s MV E/A ratio:   1.07 Weston Brass MD Electronically signed by Weston Brass MD Signature Date/Time: 12/10/2023/10:05:55 AM    Final       LOS: 4 days  Jacquelin Hawking, MD [facility] 12/11/2023, 9:04 AM   If 7PM-7AM, please contact night-coverage [email or web address]

## 2023-12-11 NOTE — TOC CM/SW Note (Signed)
Dietician reached out regarding cost for Creon- per [facility] pharmacy benefits advocates-  on Mon 1/20-  12:01 PM Per Pharm technician, "Creon is $302.00 Zenpep is $302.00 . Looks like for Creon and Zenpep, the first co-pay will allow patient to meet deductible and co-pay may reduce as low as $47.00 Not sure we can get the cost down any lower, there are no coupons for medicare patients. PharmD to give her the website for Extra Help program so she can see if she qualifies. Since patient has a deductible, there's nothing we can do until she reaches it. PharmD to speak with her"

## 2023-12-12 DIAGNOSIS — F418 Other specified anxiety disorders: Secondary | ICD-10-CM | POA: Diagnosis not present

## 2023-12-12 DIAGNOSIS — E43 Unspecified severe protein-calorie malnutrition: Secondary | ICD-10-CM

## 2023-12-12 DIAGNOSIS — K861 Other chronic pancreatitis: Secondary | ICD-10-CM

## 2023-12-12 DIAGNOSIS — T827XXA Infection and inflammatory reaction due to other cardiac and vascular devices, implants and grafts, initial encounter: Secondary | ICD-10-CM | POA: Diagnosis not present

## 2023-12-12 DIAGNOSIS — I10 Essential (primary) hypertension: Secondary | ICD-10-CM | POA: Diagnosis not present

## 2023-12-12 LAB — GLUCOSE, CAPILLARY
Glucose-Capillary: 78 mg/dL (ref 70–99)
Glucose-Capillary: 131 mg/dL — ABNORMAL HIGH (ref 70–99)
Glucose-Capillary: 149 mg/dL — ABNORMAL HIGH (ref 70–99)
Glucose-Capillary: 232 mg/dL — ABNORMAL HIGH (ref 70–99)

## 2023-12-12 LAB — BASIC METABOLIC PANEL
Anion gap: 5 (ref 5–15)
BUN: 9 mg/dL (ref 8–23)
CO2: 23 mmol/L (ref 22–32)
Calcium: 7.2 mg/dL — ABNORMAL LOW (ref 8.9–10.3)
Chloride: 107 mmol/L (ref 98–111)
Creatinine, Ser: 0.55 mg/dL (ref 0.44–1.00)
GFR, Estimated: >60 mL/min (ref >60)
Glucose, Bld: 105 mg/dL — ABNORMAL HIGH (ref 70–99)
Potassium: 3.5 mmol/L (ref 3.5–5.1)
Sodium: 135 mmol/L (ref 135–145)

## 2023-12-12 LAB — CBC
HCT: 24.4 % — ABNORMAL LOW (ref 36.0–46.0)
Hemoglobin: 8.2 g/dL — ABNORMAL LOW (ref 12.0–15.0)
MCH: 31.7 pg (ref 26.0–34.0)
MCHC: 33.6 g/dL (ref 30.0–36.0)
MCV: 94.2 fL (ref 80.0–100.0)
Platelets: 148 10*3/uL — ABNORMAL LOW (ref 150–400)
RBC: 2.59 MIL/uL — ABNORMAL LOW (ref 3.87–5.11)
RDW: 14.2 % (ref 11.5–15.5)
WBC: 9.4 10*3/uL (ref 4.0–10.5)
nRBC: 0 % (ref 0.0–0.2)

## 2023-12-12 LAB — CULTURE, BLOOD (ROUTINE X 2): Culture: NO GROWTH

## 2023-12-12 LAB — MAGNESIUM: Magnesium: 1.5 mg/dL — ABNORMAL LOW (ref 1.7–2.4)

## 2023-12-12 MED ORDER — MAGNESIUM SULFATE 2 GM/50ML IV SOLN
2.0000 g | Freq: Once | INTRAVENOUS | Status: AC
  Administered 2023-12-12: 2 g via INTRAVENOUS
  Filled 2023-12-12: qty 50

## 2023-12-12 MED ORDER — POTASSIUM CHLORIDE CRYS ER 20 MEQ PO TBCR
40.0000 meq | EXTENDED_RELEASE_TABLET | Freq: Once | ORAL | Status: AC
  Administered 2023-12-12: 40 meq via ORAL
  Filled 2023-12-12: qty 2

## 2023-12-12 MED ORDER — HEPARIN SODIUM (PORCINE) 5000 UNIT/ML IJ SOLN
5000.0000 [IU] | Freq: Three times a day (TID) | INTRAMUSCULAR | Status: DC
  Administered 2023-12-12 – 2023-12-19 (×19): 5000 [IU] via SUBCUTANEOUS
  Filled 2023-12-12 (×18): qty 1

## 2023-12-12 MED ORDER — DULOXETINE HCL 60 MG PO CPEP
60.0000 mg | ORAL_CAPSULE | Freq: Every day | ORAL | Status: DC
  Administered 2023-12-12 – 2023-12-19 (×8): 60 mg via ORAL
  Filled 2023-12-12 (×8): qty 1

## 2023-12-12 NOTE — Plan of Care (Signed)
Problem: Education: Goal: Ability to describe self-care measures that may prevent or decrease complications (Diabetes Survival Skills Education) will improve Outcome: Progressing Goal: Individualized Educational Video(s) Outcome: Progressing   Problem: Coping: Goal: Ability to adjust to condition or change in health will improve Outcome: Progressing   Problem: Fluid Volume: Goal: Ability to maintain a balanced intake and output will improve Outcome: Progressing   Problem: Health Behavior/Discharge Planning: Goal: Ability to identify and utilize available resources and services will improve Outcome: Progressing Goal: Ability to manage health-related needs will improve Outcome: Progressing   Problem: Metabolic: Goal: Ability to maintain appropriate glucose levels will improve Outcome: Progressing   Problem: Nutritional: Goal: Maintenance of adequate nutrition will improve Outcome: Progressing Goal: Progress toward achieving an optimal weight will improve Outcome: Progressing   Problem: Skin Integrity: Goal: Risk for impaired skin integrity will decrease Outcome: Progressing   Problem: Tissue Perfusion: Goal: Adequacy of tissue perfusion will improve Outcome: Progressing   Problem: Fluid Volume: Goal: Hemodynamic stability will improve Outcome: Progressing   Problem: Clinical Measurements: Goal: Diagnostic test results will improve Outcome: Progressing Goal: Signs and symptoms of infection will decrease Outcome: Progressing   Problem: Respiratory: Goal: Ability to maintain adequate ventilation will improve Outcome: Progressing   Problem: Health Behavior/Discharge Planning: Goal: Ability to manage health-related needs will improve Outcome: Progressing   Problem: Clinical Measurements: Goal: Ability to maintain clinical measurements within normal limits will improve Outcome: Progressing Goal: Will remain free from infection Outcome: Progressing Goal:  Diagnostic test results will improve Outcome: Progressing Goal: Respiratory complications will improve Outcome: Progressing Goal: Cardiovascular complication will be avoided Outcome: Progressing   Problem: Activity: Goal: Risk for activity intolerance will decrease Outcome: Progressing   Problem: Nutrition: Goal: Adequate nutrition will be maintained Outcome: Progressing   Problem: Coping: Goal: Level of anxiety will decrease Outcome: Progressing   Problem: Elimination: Goal: Will not experience complications related to bowel motility Outcome: Progressing Goal: Will not experience complications related to urinary retention Outcome: Progressing   Problem: Pain Managment: Goal: General experience of comfort will improve and/or be controlled Outcome: Progressing   Problem: Safety: Goal: Ability to remain free from injury will improve Outcome: Progressing   Problem: Skin Integrity: Goal: Risk for impaired skin integrity will decrease Outcome: Progressing   Problem: Education: Goal: Knowledge of prescribed regimen will improve Outcome: Progressing   Problem: Activity: Goal: Ability to tolerate increased activity will improve Outcome: Progressing   Problem: Bowel/Gastric: Goal: Gastrointestinal status for postoperative course will improve Outcome: Progressing   Problem: Clinical Measurements: Goal: Postoperative complications will be avoided or minimized Outcome: Progressing Goal: Signs and symptoms of graft occlusion will improve Outcome: Progressing   Problem: Skin Integrity: Goal: Demonstration of wound healing without infection will improve Outcome: Progressing   Problem: Education: Goal: Ability to describe self-care measures that may prevent or decrease complications (Diabetes Survival Skills Education) will improve Outcome: Progressing Goal: Individualized Educational Video(s) Outcome: Progressing   Problem: Coping: Goal: Ability to adjust to  condition or change in health will improve Outcome: Progressing   Problem: Fluid Volume: Goal: Ability to maintain a balanced intake and output will improve Outcome: Progressing   Problem: Health Behavior/Discharge Planning: Goal: Ability to identify and utilize available resources and services will improve Outcome: Progressing Goal: Ability to manage health-related needs will improve Outcome: Progressing   Problem: Metabolic: Goal: Ability to maintain appropriate glucose levels will improve Outcome: Progressing   Problem: Nutritional: Goal: Maintenance of adequate nutrition will improve Outcome: Progressing Goal: Progress toward achieving an  optimal weight will improve Outcome: Progressing   Problem: Skin Integrity: Goal: Risk for impaired skin integrity will decrease Outcome: Progressing   Problem: Tissue Perfusion: Goal: Adequacy of tissue perfusion will improve Outcome: Progressing   Problem: Fluid Volume: Goal: Hemodynamic stability will improve Outcome: Progressing   Problem: Clinical Measurements: Goal: Diagnostic test results will improve Outcome: Progressing Goal: Signs and symptoms of infection will decrease Outcome: Progressing   Problem: Respiratory: Goal: Ability to maintain adequate ventilation will improve Outcome: Progressing   Problem: Health Behavior/Discharge Planning: Goal: Ability to manage health-related needs will improve Outcome: Progressing   Problem: Clinical Measurements: Goal: Ability to maintain clinical measurements within normal limits will improve Outcome: Progressing Goal: Will remain free from infection Outcome: Progressing Goal: Diagnostic test results will improve Outcome: Progressing Goal: Respiratory complications will improve Outcome: Progressing Goal: Cardiovascular complication will be avoided Outcome: Progressing   Problem: Activity: Goal: Risk for activity intolerance will decrease Outcome: Progressing    Problem: Nutrition: Goal: Adequate nutrition will be maintained Outcome: Progressing   Problem: Coping: Goal: Level of anxiety will decrease Outcome: Progressing   Problem: Elimination: Goal: Will not experience complications related to bowel motility Outcome: Progressing Goal: Will not experience complications related to urinary retention Outcome: Progressing   Problem: Pain Managment: Goal: General experience of comfort will improve and/or be controlled Outcome: Progressing   Problem: Safety: Goal: Ability to remain free from injury will improve Outcome: Progressing   Problem: Skin Integrity: Goal: Risk for impaired skin integrity will decrease Outcome: Progressing   Problem: Education: Goal: Knowledge of prescribed regimen will improve Outcome: Progressing   Problem: Activity: Goal: Ability to tolerate increased activity will improve Outcome: Progressing   Problem: Bowel/Gastric: Goal: Gastrointestinal status for postoperative course will improve Outcome: Progressing   Problem: Clinical Measurements: Goal: Postoperative complications will be avoided or minimized Outcome: Progressing Goal: Signs and symptoms of graft occlusion will improve Outcome: Progressing   Problem: Skin Integrity: Goal: Demonstration of wound healing without infection will improve Outcome: Progressing

## 2023-12-12 NOTE — Progress Notes (Signed)
Physical Therapy Treatment Patient Details Name: Jill Higgins MRN: [id number] DOB: 12-04-1945 Today's Date: 12/12/2023   History of Present Illness The pt is a 78 yo female presenting 1/17 with drainage from R groin incision (recent R axillary - femoral bypass on 12/26). Now s/p redo R axilla - femoral artery bypass with wound vac placement 1/19. PMH includes: ight common femoral endarterectomy with right femoral to above-knee popliteal bypass, stenting of the right iliac and common femoral arteries, bronchitis, bulging disc, depression w/anxiety, DM II, fibromyalgia, GERD, glaucoma, hyperlipidemia, HTN, PVD, and sleep apnea.    PT Comments  Patient initially asleep, though agreeable to PT once aroused.  She was very slow moving to EOB and needed about 15 minutes to walk about 20' with RW and min A.  She voices eager to return home, stating a friend could help, but feel she may need inpatient rehab (<3 hours/day ) at d/c unless greatly improved.  PT will continue to follow.     If plan is discharge home, recommend the following: Help with stairs or ramp for entrance;Assist for transportation;Assistance with cooking/housework;A lot of help with walking and/or transfers;A little help with bathing/dressing/bathroom   Can travel by private vehicle        Equipment Recommendations  None recommended by PT    Recommendations for Other Services       Precautions / Restrictions Precautions Precautions: Fall Precaution Comments: wound vac R groin     Mobility  Bed Mobility Overal bed mobility: Needs Assistance Bed Mobility: Supine to Sit     Supine to sit: HOB elevated, Min assist     General bed mobility comments: lifting help for trunk, assist for scooting to EOB with cues and increased time    Transfers Overall transfer level: Needs assistance Equipment used: Rolling walker (2 wheels) Transfers: Sit to/from Stand Sit to Stand: Mod assist           General transfer comment:  some lifting help to stand    Ambulation/Gait Ambulation/Gait assistance: Min assist Gait Distance (Feet): 75 Feet Assistive device: Rolling walker (2 wheels) Gait Pattern/deviations: Step-to pattern, Step-through pattern, Decreased stride length, Trunk flexed, Wide base of support, Shuffle Gait velocity: walked 75' in about 15 minutes     General Gait Details: very slow and short shuffling steps; assist to manage walker on turns, around obstacles and assist for balance throughout   Stairs             Wheelchair Mobility     Tilt Bed    Modified Rankin (Stroke Patients Only)       Balance Overall balance assessment: Needs assistance Sitting-balance support: Feet supported Sitting balance-Leahy Scale: Good     Standing balance support: Bilateral upper extremity supported Standing balance-Leahy Scale: Poor Standing balance comment: UE support and flexed posture throughout                            Cognition Arousal: Alert Behavior During Therapy: Flat affect Overall Cognitive Status: Difficult to assess                                 General Comments: very limited verbalizations, slow to respond though finally answering questions for distance for ambulation, where she wanted to sit and that she did want to participate        Exercises      General  Comments General comments (skin integrity, edema, etc.): VSS on RA, replaced purwik end of session and NT in the room      Pertinent Vitals/Pain Pain Assessment Pain Assessment: Faces Faces Pain Scale: Hurts little more Pain Location: R groin Pain Descriptors / Indicators: Discomfort, Grimacing, Sore Pain Intervention(s): Monitored during session    Home Living                          Prior Function            PT Goals (current goals can now be found in the care plan section) Progress towards PT goals: Progressing toward goals    Frequency    Min  1X/week      PT Plan      Co-evaluation              AM-PAC PT "6 Clicks" Mobility   Outcome Measure  Help needed turning from your back to your side while in a flat bed without using bedrails?: A Little Help needed moving from lying on your back to sitting on the side of a flat bed without using bedrails?: A Lot Help needed moving to and from a bed to a chair (including a wheelchair)?: A Little Help needed standing up from a chair using your arms (e.g., wheelchair or bedside chair)?: A Lot Help needed to walk in hospital room?: A Little Help needed climbing 3-5 steps with a railing? : Total 6 Click Score: 14    End of Session Equipment Utilized During Treatment: Gait belt Activity Tolerance: Patient limited by fatigue Patient left: in chair;with call bell/phone within reach;with chair alarm set   PT Visit Diagnosis: Other abnormalities of gait and mobility (R26.89);Pain Pain - Right/Left: Right Pain - part of body: Hip     Time: 1549-1630 PT Time Calculation (min) (ACUTE ONLY): 41 min  Charges:    $Gait Training: 8-22 mins $Therapeutic Activity: 23-37 mins PT General Charges $$ ACUTE PT VISIT: 1 Visit                     Sheran Lawless, PT Acute Rehabilitation Services [phone] 12/12/2023    Elray Mcgregor 12/12/2023, 5:48 PM

## 2023-12-12 NOTE — Progress Notes (Addendum)
  Progress Note    12/12/2023 7:40 AM 3 Days Post-Op  Subjective:  somnolent this morning   Vitals:   12/12/23 0413 12/12/23 0735  BP: 110/62 (!) 118/57  Pulse: 75 73  Resp: 17 15  Temp: 98 F (36.7 C) 97.7 F (36.5 C)  SpO2: 100% 100%   Physical Exam: Lungs:  non labored Incisions:  R chest c/d/I; R groin vac with good seal; 200cc total output; L arm incision with ecchymosis but no firm hematoma Extremities:  R DP by doppler; palpable L DP Abdomen:  soft Neurologic: A&O  CBC    Component Value Date/Time   WBC 9.4 12/12/2023 0324   RBC 2.59 (L) 12/12/2023 0324   HGB 8.2 (L) 12/12/2023 0324   HCT 24.4 (L) 12/12/2023 0324   PLT 148 (L) 12/12/2023 0324   MCV 94.2 12/12/2023 0324   MCH 31.7 12/12/2023 0324   MCHC 33.6 12/12/2023 0324   RDW 14.2 12/12/2023 0324   LYMPHSABS 1.6 12/07/2023 2024   MONOABS 0.8 12/07/2023 2024   EOSABS 0.0 12/07/2023 2024   BASOSABS 0.1 12/07/2023 2024    BMET    Component Value Date/Time   NA 135 12/12/2023 0324   K 3.5 12/12/2023 0324   CL 107 12/12/2023 0324   CO2 23 12/12/2023 0324   GLUCOSE 105 (H) 12/12/2023 0324   BUN 9 12/12/2023 0324   CREATININE 0.55 12/12/2023 0324   CALCIUM 7.2 (L) 12/12/2023 0324   GFRNONAA >60 12/12/2023 0324   GFRAA >60 09/04/2019 0249    INR    Component Value Date/Time   INR 1.3 (H) 12/07/2023 2024     Intake/Output Summary (Last 24 hours) at 12/12/2023 0740 Last data filed at 12/12/2023 0735 Gross per 24 hour  Intake 431.59 ml  Output 810 ml  Net -378.41 ml     Assessment/Plan:  78 y.o. female is s/p R ax fem bypass excision 3 Days Post-Op   R foot warm and well perfused with DP by doppler R hand well perfused with palpable radial and symmetrical grip strength Vac change likely today; will discuss timing with Dr. Chestine Spore  JP output 10cc in last 24h; ok to d/c Continue daptomycin per ID; plans noted for TEE OOB with therapy again today; HH PT and OT per recommendations  DVT  prophylaxis: subcutaneous heparin if ok with primary team  Emilie Rutter, PA-C [facility] [phone] 12/12/2023 7:40 AM  I have seen and evaluated the patient. I agree with the PA note as documented above.  Status post excision of infected right axillofemoral.  Right radial palpable.  Right chest wall incision clean and dry. Will DC drain to chest wall.   Will change the VAC to the right groin today.  Monophasic Doppler signals in the right foot.  OR cultures growing MRSA and appreciate ID input.  Cephus Shelling, MD [facility] Office: [phone]  ADDENDUM: R chest JP was discontinued.  R groin wound vac was changed at the bedside and the patient tolerated this well.  There is healthy appearing wound bed with granulating tissue.  No exposed vessels or patch.  Wound measures 5cm L x 2cm W x 2cm D

## 2023-12-12 NOTE — Progress Notes (Signed)
OT Cancellation Note  Patient Details Name: Jill Higgins MRN: [id number] DOB: 1946/03/01   Cancelled Treatment:    Reason Eval/Treat Not Completed: Patient declined, no reason specified Pt requesting OT return tomorrow due to fatigue. Pt also expressed emotion regarding overhearing code blue across the hall from her this morning. This therapist provided a listening ear and comfort for this pt. RN was notified as well. OT will return as time allows and pt is appropriate.   [facility] OTR/L Acute Rehabilitation Services Office: [phone]   Providence Crosby 12/12/2023, 12:12 PM

## 2023-12-12 NOTE — Progress Notes (Signed)
PROGRESS NOTE    Jill Higgins  ZOX:[id number] DOB: 05-23-1946 DOA: 12/07/2023 PCP: Merri Brunette, MD   Brief Narrative:  Jill Higgins is a 78 y.o. female with a history of hypertension, diabetes mellitus type 2, depression, anxiety, PAD with thrombosis of right extremity iliac/common femoral and disabling claudication status post axillofemoral bypass graft.  Patient presented secondary to discharge from groin incision with associated chills concerning for evidence of infection.  Patient met sepsis criteria on admission to start empiric antibiotics.  Blood cultures obtained prior to antibiotics.  Vascular surgery consulted with plan for surgical management.   **Interim History   Assessment and Plan:  Severe Sepsis -Present on admission with associated lactic acidosis.  -Secondary to graft infection.  -Blood cultures (1/17) obtained on admission and are significant for MRSA.  -Empiric Vancomycin and Cefepime started and transitioned to Vancomycin monotherapy and now Daptomycin.  -ID consulted.   Right axillo-femoral bypass graft infection -Present on admission. Surgery performed on 12/26.  -Vascular surgery consulted and are following.  -Patient started empirically on Vancomycin and Cefepime.  -WBC of 24,900 on admission is improving.  -WBC Trend: Recent Labs  Lab 11/16/23 0510 12/07/23 2024 12/08/23 0529 12/09/23 0307 12/10/23 0831 12/11/23 0335 12/12/23 0324  WBC 10.8* 24.9* 17.4* 13.9* 11.7* 7.9 9.4  -Cefepime discontinued. Patient underwent removal of bypass on 1/19; culture significant for GPCs -Vancomycin changed to IV Daptomycin by ID -Follow-up graft culture data   MRSA Bacteremia -Source is likely right groin wound. ID consulted.  -Patient was managed on Vancomycin. Repeat blood cultures (1/19) obtained and are no growth to date. Transthoracic Echocardiogram significant for no evidence of valvular vegetations. -ID recommendations: Vancomycin now changed to  Daptomycin on 12/11/23(plan for 4-6 weeks),  -Transesophageal Echocardiogram recommended by ID -Consult Cardiology for Transesophageal Echocardiogram consideration and per ID repeat blood cultures negative for 2 days and they remain negative that is okay from an infectious disease standpoint to place PICC line   PAD -S/p axillo-femoral bypass graft.  -Patient follows with vascular surgery as an outpatient.  -LDL is down to 15. -Continue Atorvastatin 80 mg po Daily, Aspirin 81 mg po qHS and Clopidogrel 75 mg po Daily    Primary Hypertension -Patient is on Amlodipine, Losartan and Toprol Xl as an outpatient. -Antihypertensives held on admission secondary to hypotension. -Continue to Monitor BP per Protocol -Last BP reading was 117/57   Diabetes Mellitus, Type 2 -Uncontrolled with hyperglycemia.  -Hold home Metformin 500 mg po BID -Last hemoglobin A1C of 9.5%.  -Patient is managed on insulin glargine, insulin aspart, and metformin as an outpatient and started on SSI on admission. Blood sugar low-normal initially, now worsening. -Continue Sensitive Novolog SSI AC and  Semglee 5 units daily -CBG Trend: Recent Labs  Lab 12/11/23 0428 12/11/23 1154 12/11/23 1707 12/11/23 2109 12/12/23 0610 12/12/23 1206 12/12/23 1632  GLUCAP 213* 173* 225* 203* 78 131* 149*    Depression and Anxiety -Continue Duloxetine 60 mg po Daily   Insomnia -Continue Temazepam 30 mg po qHSprn Sleep  Hypokalemia -Patient's K+ Level Trend: Recent Labs  Lab 12/08/23 0529 12/09/23 0307 12/09/23 0914 12/09/23 1045 12/10/23 0355 12/11/23 0335 12/12/23 0324  K 3.2* 2.7* 3.3* 3.9 3.7 3.2* 3.5  -Replete with po Kcl 40 mEQ x1 -Continue to Monitor and Replete as Necessary -Repeat CMP in the AM   Hypomagnesemia -Patient's Mag Level Trend: Recent Labs  Lab 12/08/23 0529 12/10/23 0355 12/12/23 0324  MG 1.0* 2.0 1.5*  -Replete with IV Mag Sulfate  2 grams -Continue to Monitor and Replete as  Necessary -Repeat Mag in the AM   ABLA and Perioperative Blood loss superimposed on Normocytic Anemia -Secondary to vascular surgery. Patient with hemoglobin down to 7.1 g/dL requiring a transfusion of 2 units of PRBC. Posttransfusion hemoglobin of 10.0, down to 8.2 today  -Hgb/Hct Trend: Recent Labs  Lab 12/08/23 0529 12/09/23 0307 12/09/23 0914 12/09/23 1045 12/10/23 0831 12/11/23 0335 12/12/23 0324  HGB 10.0* 10.3* 9.2* 7.1* 10.0* 8.6* 8.2*  HCT 29.7* 30.7* 27.0* 21.0* 28.4* 24.6* 24.4*  MCV 95.2 93.0  --   --  91.6 93.2 94.2  -Continue to Monitor for S/Sx of Bleeding; No overt bleeding noted -Repeat CBC in the AM   Hypoalbuminemia -Patient's Albumin Trend: Recent Labs  Lab 12/07/23 2024 12/08/23 0529 12/09/23 0307 12/10/23 0355 12/11/23 0335  ALBUMIN 2.4* 1.7* 1.7* 1.8* 1.7*  -Continue to Monitor and Trend and repeat CMP in the AM    DVT prophylaxis: heparin injection 5,000 Units Start: 12/12/23 0800 SCD's Start: 12/09/23 1835 SCDs Start: 12/07/23 2343    Code Status: Full Code Family Communication: No family currently at bedside  Disposition Plan:  Level of care: Progressive Status is: Inpatient Remains inpatient appropriate because: Needs further clinical improvement and clearance by specialist and further workup with TEE   Consultants:  Vascular Surgery Infectious Diseases Notified Cardiology (was never done by prior Attending) and I spoke to Dr. Rennis Golden   Procedures:  Procedure by Dr. Chestine Spore: 1.  Harvest of left arm basilic vein 2.  Redo exposure of right axillary artery 3.  Excision of right axillary artery to femoral artery bypass 4.  Vein patch angioplasty right axillary artery 5.  Vein patch angioplasty of right femoral artery distal bypass 6.  Vac right groin   Antimicrobials:  Anti-infectives (From admission, onward)    Start     Dose/Rate Route Frequency Ordered Stop   12/11/23 1400  DAPTOmycin (CUBICIN) IVPB 500 mg/29mL premix        500  mg 100 mL/hr over 30 Minutes Intravenous Daily 12/11/23 0836     12/10/23 1400  DAPTOmycin (CUBICIN) 400 mg in sodium chloride 0.9 % IVPB  Status:  Discontinued        8 mg/kg  50 kg 116 mL/hr over 30 Minutes Intravenous Daily 12/10/23 1111 12/11/23 0836   12/09/23 2100  vancomycin (VANCOCIN) 750 mg in sodium chloride 0.9 % 250 mL IVPB  Status:  Discontinued        750 mg 265 mL/hr over 60 Minutes Intravenous Every 24 hours 12/09/23 1759 12/10/23 1111   12/09/23 0800  vancomycin (VANCOCIN) IVPB 1000 mg/200 mL premix  Status:  Discontinued        1,000 mg 200 mL/hr over 60 Minutes Intravenous  Once 12/09/23 0746 12/09/23 0807   12/09/23 0748  vancomycin (VANCOCIN) 1-5 GM/200ML-% IVPB       Note to Pharmacy: Aquilla Hacker M: cabinet override      12/09/23 0748 12/09/23 1959   12/08/23 2100  vancomycin (VANCOCIN) 750 mg in sodium chloride 0.9 % 250 mL IVPB  Status:  Discontinued        750 mg 250 mL/hr over 60 Minutes Intravenous Every 24 hours 12/07/23 2359 12/09/23 1759   12/07/23 2100  vancomycin (VANCOCIN) IVPB 1000 mg/200 mL premix        1,000 mg 200 mL/hr over 60 Minutes Intravenous  Once 12/07/23 2048 12/07/23 2313   12/07/23 2100  ceFEPIme (MAXIPIME) 2 g in sodium chloride  0.9 % 100 mL IVPB  Status:  Discontinued        2 g 200 mL/hr over 30 Minutes Intravenous Every 12 hours 12/07/23 2053 12/08/23 1519       Subjective: And examined at bedside and was feeling weak and fatigued.  Felt okay.  No complaints.  No nausea or vomiting.  Denies any lightheadedness or dizziness.  No other concerns requested this time.  Objective: Vitals:   12/12/23 0413 12/12/23 0735 12/12/23 1204 12/12/23 1630  BP: 110/62 (!) 118/57 124/62 (!) 117/57  Pulse: 75 73 77 71  Resp: 17 15 16 14   Temp: 98 F (36.7 C) 97.7 F (36.5 C) 98.2 F (36.8 C) 98.4 F (36.9 C)  TempSrc: Oral Oral Oral Oral  SpO2: 100% 100% 97% 99%  Weight:      Height:        Intake/Output Summary (Last 24 hours) at  12/12/2023 1957 Last data filed at 12/12/2023 1630 Gross per 24 hour  Intake 0 ml  Output 1400 ml  Net -1400 ml   Filed Weights   12/07/23 2006 12/09/23 0648  Weight: 50 kg 50 kg   Examination: Physical Exam:  Constitutional: Thin African-American female who appears chronically ill in no acute distress appears weak and fatigued Respiratory: Diminished to auscultation bilaterally, no wheezing, rales, rhonchi or crackles. Normal respiratory effort and patient is not tachypenic. No accessory muscle use.  Unlabored breathing Cardiovascular: RRR, no murmurs / rubs / gallops. S1 and S2 auscultated.  Mild extremity edema Abdomen: Soft, non-tender, non-distended. Bowel sounds positive.  GU: Deferred. Musculoskeletal: No clubbing / cyanosis of digits/nails.  Right arm appears little swollen Neurologic: CN 2-12 grossly intact with no focal deficits. Romberg sign and cerebellar reflexes not assessed.  Psychiatric: Normal judgment and insight. Alert and oriented x 3. Normal mood and appropriate affect.   Data Reviewed: I have personally reviewed following labs and imaging studies  CBC: Recent Labs  Lab 12/07/23 2024 12/07/23 2030 12/08/23 0529 12/09/23 0307 12/09/23 0914 12/09/23 1045 12/10/23 0831 12/11/23 0335 12/12/23 0324  WBC 24.9*  --  17.4* 13.9*  --   --  11.7* 7.9 9.4  NEUTROABS 20.9*  --   --   --   --   --   --   --   --   HGB 13.0   < > 10.0* 10.3* 9.2* 7.1* 10.0* 8.6* 8.2*  HCT 39.4   < > 29.7* 30.7* 27.0* 21.0* 28.4* 24.6* 24.4*  MCV 94.7  --  95.2 93.0  --   --  91.6 93.2 94.2  PLT 281  --  177 172  --   --  108* 115* 148*   < > = values in this interval not displayed.   Basic Metabolic Panel: Recent Labs  Lab 12/08/23 0529 12/09/23 0307 12/09/23 0914 12/09/23 1045 12/10/23 0355 12/11/23 0335 12/12/23 0324  NA 136 134* 135 136 137 141 135  K 3.2* 2.7* 3.3* 3.9 3.7 3.2* 3.5  CL 107 106 102  --  107 110 107  CO2 22 21*  --   --  24 21* 23  GLUCOSE 118* 92  176*  --  117* 232* 105*  BUN 11 10 8   --  7* 5* 9  CREATININE 0.93 0.81 0.60  --  0.71 0.76 0.55  CALCIUM 7.1* 7.1*  --   --  7.4* 7.2* 7.2*  MG 1.0*  --   --   --  2.0  --  1.5*  GFR: Estimated Creatinine Clearance: 45.7 mL/min (by C-G formula based on SCr of 0.55 mg/dL). Liver Function Tests: Recent Labs  Lab 12/07/23 2024 12/08/23 0529 12/09/23 0307 12/10/23 0355 12/11/23 0335  AST 84* 46* 34 20 23  ALT 37 25 22 15 17   ALKPHOS 71 52 62 38 45  BILITOT 0.6 0.6 0.5 0.8 0.6  PROT 5.7* 4.2* 4.3* 3.7* 3.8*  ALBUMIN 2.4* 1.7* 1.7* 1.8* 1.7*   No results for input(s): "LIPASE", "AMYLASE" in the last 168 hours. No results for input(s): "AMMONIA" in the last 168 hours. Coagulation Profile: Recent Labs  Lab 12/07/23 2024  INR 1.3*   Cardiac Enzymes: Recent Labs  Lab 12/08/23 0529 12/11/23 0335  CKTOTAL 270* 114   BNP (last 3 results) No results for input(s): "PROBNP" in the last 8760 hours. HbA1C: No results for input(s): "HGBA1C" in the last 72 hours. CBG: Recent Labs  Lab 12/11/23 1707 12/11/23 2109 12/12/23 0610 12/12/23 1206 12/12/23 1632  GLUCAP 225* 203* 78 131* 149*   Lipid Profile: Recent Labs    12/10/23 0355  CHOL 48  HDL 20*  LDLCALC 15  TRIG 65  CHOLHDL 2.4   Thyroid Function Tests: No results for input(s): "TSH", "T4TOTAL", "FREET4", "T3FREE", "THYROIDAB" in the last 72 hours. Anemia Panel: No results for input(s): "VITAMINB12", "FOLATE", "FERRITIN", "TIBC", "IRON", "RETICCTPCT" in the last 72 hours. Sepsis Labs: Recent Labs  Lab 12/07/23 2036 12/08/23 0052 12/08/23 0529  LATICACIDVEN 5.7* 4.2* 1.6    Recent Results (from the past 240 hours)  Culture, blood (Routine x 2)     Status: Abnormal (Preliminary result)   Collection Time: 12/07/23  8:17 PM   Specimen: BLOOD  Result Value Ref Range Status   Specimen Description BLOOD LEFT ANTECUBITAL  Final   Special Requests   Final    BOTTLES DRAWN AEROBIC AND ANAEROBIC Blood Culture  results may not be optimal due to an inadequate volume of blood received in culture bottles   Culture  Setup Time   Final    GRAM POSITIVE COCCI ANAEROBIC BOTTLE ONLY CRITICAL RESULT CALLED TO, READ BACK BY AND VERIFIED WITH: PHARMD JESSICA MILLEN [id number] AT 1411 BY EC    Culture (A)  Final    METHICILLIN RESISTANT STAPHYLOCOCCUS AUREUS CULTURE REINCUBATED FOR BETTER GROWTH Performed at [facility], [address] [place], [place] [address]    Report Status PENDING  Incomplete   Organism ID, Bacteria METHICILLIN RESISTANT STAPHYLOCOCCUS AUREUS  Final      Susceptibility   Methicillin resistant staphylococcus aureus - MIC*    CIPROFLOXACIN >=8 RESISTANT Resistant     ERYTHROMYCIN <=0.25 SENSITIVE Sensitive     GENTAMICIN <=0.5 SENSITIVE Sensitive     OXACILLIN >=4 RESISTANT Resistant     TETRACYCLINE <=1 SENSITIVE Sensitive     VANCOMYCIN <=0.5 SENSITIVE Sensitive     TRIMETH/SULFA <=10 SENSITIVE Sensitive     CLINDAMYCIN <=0.25 SENSITIVE Sensitive     RIFAMPIN <=0.5 SENSITIVE Sensitive     Inducible Clindamycin NEGATIVE Sensitive     LINEZOLID 2 SENSITIVE Sensitive     * METHICILLIN RESISTANT STAPHYLOCOCCUS AUREUS  Blood Culture ID Panel (Reflexed)     Status: Abnormal   Collection Time: 12/07/23  8:17 PM  Result Value Ref Range Status   Enterococcus faecalis NOT DETECTED NOT DETECTED Final   Enterococcus Faecium NOT DETECTED NOT DETECTED Final   Listeria monocytogenes NOT DETECTED NOT DETECTED Final   Staphylococcus species DETECTED (A) NOT DETECTED Final    Comment:  CRITICAL RESULT CALLED TO, READ BACK BY AND VERIFIED WITH: PHARMD JESSICA MILLEN [id number] AT 1411 BY EC    Staphylococcus aureus (BCID) DETECTED (A) NOT DETECTED Final    Comment: Methicillin (oxacillin)-resistant Staphylococcus aureus (MRSA). MRSA is predictably resistant to beta-lactam antibiotics (except ceftaroline). Preferred therapy is vancomycin unless clinically contraindicated. Patient requires  contact precautions if  hospitalized. CRITICAL RESULT CALLED TO, READ BACK BY AND VERIFIED WITH: PHARMD JESSICA MILLEN [id number] AT 1411 BY EC    Staphylococcus epidermidis NOT DETECTED NOT DETECTED Final   Staphylococcus lugdunensis NOT DETECTED NOT DETECTED Final   Streptococcus species NOT DETECTED NOT DETECTED Final   Streptococcus agalactiae NOT DETECTED NOT DETECTED Final   Streptococcus pneumoniae NOT DETECTED NOT DETECTED Final   Streptococcus pyogenes NOT DETECTED NOT DETECTED Final   A.calcoaceticus-baumannii NOT DETECTED NOT DETECTED Final   Bacteroides fragilis NOT DETECTED NOT DETECTED Final   Enterobacterales NOT DETECTED NOT DETECTED Final   Enterobacter cloacae complex NOT DETECTED NOT DETECTED Final   Escherichia coli NOT DETECTED NOT DETECTED Final   Klebsiella aerogenes NOT DETECTED NOT DETECTED Final   Klebsiella oxytoca NOT DETECTED NOT DETECTED Final   Klebsiella pneumoniae NOT DETECTED NOT DETECTED Final   Proteus species NOT DETECTED NOT DETECTED Final   Salmonella species NOT DETECTED NOT DETECTED Final   Serratia marcescens NOT DETECTED NOT DETECTED Final   Haemophilus influenzae NOT DETECTED NOT DETECTED Final   Neisseria meningitidis NOT DETECTED NOT DETECTED Final   Pseudomonas aeruginosa NOT DETECTED NOT DETECTED Final   Stenotrophomonas maltophilia NOT DETECTED NOT DETECTED Final   Candida albicans NOT DETECTED NOT DETECTED Final   Candida auris NOT DETECTED NOT DETECTED Final   Candida glabrata NOT DETECTED NOT DETECTED Final   Candida krusei NOT DETECTED NOT DETECTED Final   Candida parapsilosis NOT DETECTED NOT DETECTED Final   Candida tropicalis NOT DETECTED NOT DETECTED Final   Cryptococcus neoformans/gattii NOT DETECTED NOT DETECTED Final   Meth resistant mecA/C and MREJ DETECTED (A) NOT DETECTED Final    Comment: CRITICAL RESULT CALLED TO, READ BACK BY AND VERIFIED WITH: PHARMD JESSICA MILLEN [id number] AT 1411 BY EC Performed at [facility], [address] [place], [place] [address]   Culture, blood (Routine x 2)     Status: None   Collection Time: 12/07/23  8:20 PM   Specimen: BLOOD RIGHT ARM  Result Value Ref Range Status   Specimen Description BLOOD RIGHT ARM  Final   Special Requests   Final    BOTTLES DRAWN AEROBIC AND ANAEROBIC Blood Culture results may not be optimal due to an inadequate volume of blood received in culture bottles   Culture   Final    NO GROWTH 5 DAYS Performed at [facility], [address] [place], [place] [address]    Report Status 12/12/2023 FINAL  Final  Surgical pcr screen     Status: Abnormal   Collection Time: 12/08/23  7:57 PM   Specimen: Nasal Mucosa; Nasal Swab  Result Value Ref Range Status   MRSA, PCR POSITIVE (A) NEGATIVE Final    Comment: RESULT CALLED TO, READ BACK BY AND VERIFIED WITH: T IRBY,RN@2258  12/08/23 MK    Staphylococcus aureus POSITIVE (A) NEGATIVE Final    Comment: (NOTE) The Xpert SA Assay (FDA approved for NASAL specimens in patients 95 years of age and older), is one component of a comprehensive surveillance program. It is not intended to diagnose infection nor to guide or monitor treatment. Performed at  [facility], [address] [place] [place], [place] [address]   Culture, blood (Routine X 2) w Reflex to ID Panel     Status: None (Preliminary result)   Collection Time: 12/09/23  3:07 AM   Specimen: BLOOD RIGHT ARM  Result Value Ref Range Status   Specimen Description BLOOD RIGHT ARM  Final   Special Requests   Final    BOTTLES DRAWN AEROBIC AND ANAEROBIC Blood Culture results may not be optimal due to an inadequate volume of blood received in culture bottles   Culture   Final    NO GROWTH 3 DAYS Performed at [facility], [address] [place], [place] [address]    Report Status PENDING  Incomplete  Culture, blood (Routine X 2) w Reflex to ID Panel     Status: None (Preliminary result)   Collection Time: 12/09/23  3:07 AM    Specimen: BLOOD RIGHT HAND  Result Value Ref Range Status   Specimen Description BLOOD RIGHT HAND  Final   Special Requests   Final    BOTTLES DRAWN AEROBIC AND ANAEROBIC Blood Culture results may not be optimal due to an inadequate volume of blood received in culture bottles   Culture   Final    NO GROWTH 3 DAYS Performed at [facility], [address] [place], [place] [address]    Report Status PENDING  Incomplete  Aerobic/Anaerobic Culture w Gram Stain (surgical/deep wound)     Status: None (Preliminary result)   Collection Time: 12/09/23 10:16 AM   Specimen: Wound  Result Value Ref Range Status   Specimen Description WOUND  Final   Special Requests RIGHT GROIN  Final   Gram Stain   Final    NO WBC SEEN RARE GRAM POSITIVE COCCI IN PAIRS IN SINGLES Performed at [facility], [address], [place], [place] [address]    Culture   Final    MODERATE STAPHYLOCOCCUS AUREUS SUSCEPTIBILITIES PERFORMED ON PREVIOUS CULTURE WITHIN THE LAST 5 DAYS. NO ANAEROBES ISOLATED; CULTURE IN PROGRESS FOR 5 DAYS    Report Status PENDING  Incomplete  Aerobic/Anaerobic Culture w Gram Stain (surgical/deep wound)     Status: None (Preliminary result)   Collection Time: 12/09/23 10:18 AM   Specimen: Wound  Result Value Ref Range Status   Specimen Description WOUND  Final   Special Requests portion of graft from right groin  Final   Gram Stain   Final    MODERATE WBC PRESENT,BOTH PMN AND MONONUCLEAR FEW GRAM POSITIVE COCCI IN PAIRS Performed at [facility], [address], [place], [place] [address]    Culture   Final    ABUNDANT METHICILLIN RESISTANT STAPHYLOCOCCUS AUREUS NO ANAEROBES ISOLATED; CULTURE IN PROGRESS FOR 5 DAYS    Report Status PENDING  Incomplete   Organism ID, Bacteria METHICILLIN RESISTANT STAPHYLOCOCCUS AUREUS  Final      Susceptibility   Methicillin resistant staphylococcus aureus - MIC*    CIPROFLOXACIN >=8 RESISTANT Resistant     ERYTHROMYCIN <=0.25  SENSITIVE Sensitive     GENTAMICIN <=0.5 SENSITIVE Sensitive     OXACILLIN >=4 RESISTANT Resistant     TETRACYCLINE <=1 SENSITIVE Sensitive     VANCOMYCIN <=0.5 SENSITIVE Sensitive     TRIMETH/SULFA <=10 SENSITIVE Sensitive     CLINDAMYCIN <=0.25 SENSITIVE Sensitive     RIFAMPIN <=0.5 SENSITIVE Sensitive     Inducible Clindamycin NEGATIVE Sensitive     LINEZOLID 2 SENSITIVE Sensitive     *  ABUNDANT METHICILLIN RESISTANT STAPHYLOCOCCUS AUREUS  Aerobic/Anaerobic Culture w Gram Stain (surgical/deep wound)     Status: None (Preliminary result)   Collection Time: 12/09/23 10:23 AM   Specimen: Wound  Result Value Ref Range Status   Specimen Description WOUND  Final   Special Requests DISTAL PORTION OF GRAFT FROM RIGHT GROIN  Final   Gram Stain   Final    RARE WBC PRESENT, PREDOMINANTLY MONONUCLEAR NO ORGANISMS SEEN Performed at [facility], [address] [place], [place] [address]    Culture   Final    FEW STAPHYLOCOCCUS AUREUS SUSCEPTIBILITIES PERFORMED ON PREVIOUS CULTURE WITHIN THE LAST 5 DAYS. NO ANAEROBES ISOLATED; CULTURE IN PROGRESS FOR 5 DAYS    Report Status PENDING  Incomplete    Radiology Studies: No results found.  Scheduled Meds:  aspirin EC  81 mg Oral QHS   atorvastatin  80 mg Oral Daily   Chlorhexidine Gluconate Cloth  6 each Topical Q0600   clopidogrel  75 mg Oral Q breakfast   docusate sodium  100 mg Oral Daily   DULoxetine  60 mg Oral Daily   heparin injection (subcutaneous)  5,000 Units Subcutaneous Q8H   insulin aspart  0-9 Units Subcutaneous TID WC   insulin glargine-yfgn  5 Units Subcutaneous Daily   lactose free nutrition  237 mL Oral BID BM   multivitamin with minerals  1 tablet Oral Daily   mupirocin ointment  1 Application Nasal BID   pantoprazole  40 mg Oral BID   sodium chloride flush  3 mL Intravenous Q12H   Continuous Infusions:  sodium chloride     DAPTOmycin 500 mg (12/12/23 1556)   magnesium sulfate bolus IVPB      LOS: 5 days    Marguerita Merles, DO [facility] Available via Epic secure chat 7am-7pm After these hours, please refer to coverage provider listed on [email or web address] 12/12/2023, 7:57 PM

## 2023-12-12 NOTE — TOC Initial Note (Signed)
Transition of Care (TOC) - Initial/Assessment Note  Donn Pierini RN, BSN Transitions of Care Unit 4E- RN Case Manager See Treatment Team for direct phone #   Patient Details  Name: Jill Higgins MRN: [id number] Date of Birth: [birth year]  Transition of Care [facility]) CM/SW Contact:    Darrold Span, RN Phone Number: 12/12/2023, 2:07 PM  Clinical Narrative:                 Pt re-admitted from home, active with [facility] for [facility] needs.  Pt has rollator at home.  Received msg from [facility] PA- M. Eveland that pt will need home wound VAC, order form has been signed and faxed to [employer] to start insurance approval- pt's insurance requires auth through third part- [employer]- [employer] has sent request though.   Note per therapy notes pt may need SNF pending progress with therapies here.  Also note ID following for potential need for LT IV abx (4-6wks?) Cm will continue to follow for transition needs and coordinate care for return home w [facility] vs SNF.   [facility] liaison aware of admission and updated on potential d/c needs.   Expected Discharge Plan: Home w Home Health Services Barriers to Discharge: Continued Medical Work up   Patient Goals and CMS Choice Patient states their goals for this hospitalization and ongoing recovery are:: to get stronger and heal CMS Medicare.gov Compare Post Acute Care list provided to:: Patient Choice offered to / list presented to : Patient      Expected Discharge Plan and Services   Discharge Planning Services: CM Consult Post Acute Care Choice: Home Health Living arrangements for the past 2 months: Apartment                 DME Arranged: Vac DME Agency: [employer] Date DME Agency Contacted: 12/12/23 Time DME Agency Contacted: [phone] Representative spoke with at DME Agency: [place] HH Arranged: RN, PT, OT [facility] Agency: [facility] Date HH Agency Contacted: 12/12/23 Time HH Agency Contacted: [phone] Representative spoke with at [facility] Agency:  Aggie Cosier  Prior Living Arrangements/Services Living arrangements for the past 2 months: Apartment Lives with:: Self Patient language and need for interpreter reviewed:: Yes        Need for Family Participation in Patient Care: Yes (Comment) Care giver support system in place?: Yes (comment) Current home services: DME (rollator) Criminal Activity/Legal Involvement Pertinent to Current Situation/Hospitalization: No - Comment as needed  Activities of Daily Living      Permission Sought/Granted Permission sought to share information with : Oceanographer granted to share information with : Yes, Verbal Permission Granted     Permission granted to share info w AGENCY: HH/DME        Emotional Assessment       Orientation: : Oriented to Self, Oriented to Place, Oriented to  Time, Oriented to Situation Alcohol / Substance Use: Not Applicable Psych Involvement: No (comment)  Admission diagnosis:  Post-op pain [G89.18] Wound infection [T14.8XXA, L08.9] Vascular graft infection, initial encounter (HCC) [J19.7XXA] Patient Active Problem List   Diagnosis Date Noted   Protein-calorie malnutrition, severe 12/11/2023   Vascular graft infection, initial encounter (HCC) 12/07/2023   Sepsis (HCC) 12/07/2023   Peripheral arterial disease (HCC) 04/20/2023   Pancreatitis 01/25/2022   Chronic pancreatitis (HCC) 09/02/2019   Type 2 diabetes mellitus with vascular disease (HCC) 09/02/2019   Diabetes (HCC) 05/23/2018   Other spondylosis with radiculopathy, cervical region 03/05/2018   Foraminal stenosis of cervical region 09/11/2017  Partial tear of right rotator cuff 04/04/2017   Aftercare following surgery of the circulatory system 02/23/2016   S/P femoropopliteal bypass surgery 02/23/2016   PAD (peripheral artery disease) (HCC) 02/08/2016   Leg edema, left 12/18/2013   Aftercare following surgery of the circulatory system, NEC 11/19/2013   Ischemic leg  10/21/2013   Peripheral vascular disease, unspecified (HCC) 09/18/2013   Preop cardiovascular exam 09/08/2013   Hypertension    Hyperlipidemia    Peripheral vascular disease (HCC)    Fibromyalgia    GERD (gastroesophageal reflux disease)    Depression with anxiety    Pain in limb 04/03/2013   Atherosclerosis of native artery of extremity with intermittent claudication (HCC) 08/01/2012   Claudication (HCC) 12/28/2011   PCP:  Merri Brunette, MD Pharmacy:   [facility] [address], [place] - [address] [place] [address] Phone: [phone] Fax: [phone]  [id number] - [place], [place] - [address] AT [address]OSS [address] [place] [address] Phone: [phone] Fax: [phone]     Social Drivers of Health (SDOH) Social History: SDOH Screenings   Food Insecurity: No Food Insecurity (12/11/2023)  Housing: Low Risk  (12/11/2023)  Transportation Needs: No Transportation Needs (12/11/2023)  Utilities: Not At Risk (12/11/2023)  Depression (PHQ2-9): Low Risk  (11/11/2020)  Social Connections: Moderately Isolated (12/11/2023)  Tobacco Use: Medium Risk (12/09/2023)   SDOH Interventions:     Readmission Risk Interventions    11/20/2023    3:47 PM  Readmission Risk Prevention Plan  Post Dischage Appt Complete  Medication Screening Complete  Transportation Screening Complete

## 2023-12-13 ENCOUNTER — Inpatient Hospital Stay (HOSPITAL_COMMUNITY): Payer: Medicare Other | Admitting: Anesthesiology

## 2023-12-13 ENCOUNTER — Inpatient Hospital Stay (HOSPITAL_COMMUNITY): Payer: Medicare Other

## 2023-12-13 ENCOUNTER — Ambulatory Visit (HOSPITAL_COMMUNITY): Payer: Medicare Other

## 2023-12-13 ENCOUNTER — Encounter (HOSPITAL_COMMUNITY)
Admission: EM | Disposition: A | Discharge status: Skilled Nursing Facility | Payer: Self-pay | Source: Home / Self Care | Attending: Internal Medicine

## 2023-12-13 ENCOUNTER — Encounter (HOSPITAL_COMMUNITY): Payer: Self-pay | Admitting: Family Medicine

## 2023-12-13 ENCOUNTER — Other Ambulatory Visit: Payer: Self-pay

## 2023-12-13 DIAGNOSIS — K861 Other chronic pancreatitis: Secondary | ICD-10-CM | POA: Diagnosis not present

## 2023-12-13 DIAGNOSIS — I10 Essential (primary) hypertension: Secondary | ICD-10-CM

## 2023-12-13 DIAGNOSIS — R7881 Bacteremia: Secondary | ICD-10-CM

## 2023-12-13 DIAGNOSIS — E785 Hyperlipidemia, unspecified: Secondary | ICD-10-CM

## 2023-12-13 DIAGNOSIS — B9562 Methicillin resistant Staphylococcus aureus infection as the cause of diseases classified elsewhere: Secondary | ICD-10-CM | POA: Diagnosis not present

## 2023-12-13 DIAGNOSIS — I38 Endocarditis, valve unspecified: Secondary | ICD-10-CM

## 2023-12-13 DIAGNOSIS — T827XXA Infection and inflammatory reaction due to other cardiac and vascular devices, implants and grafts, initial encounter: Secondary | ICD-10-CM | POA: Diagnosis not present

## 2023-12-13 DIAGNOSIS — Z87891 Personal history of nicotine dependence: Secondary | ICD-10-CM

## 2023-12-13 DIAGNOSIS — F418 Other specified anxiety disorders: Secondary | ICD-10-CM | POA: Diagnosis not present

## 2023-12-13 HISTORY — PX: TRANSESOPHAGEAL ECHOCARDIOGRAM (CATH LAB): EP1270

## 2023-12-13 LAB — GLUCOSE, CAPILLARY
Glucose-Capillary: 149 mg/dL — ABNORMAL HIGH (ref 70–99)
Glucose-Capillary: 176 mg/dL — ABNORMAL HIGH (ref 70–99)
Glucose-Capillary: 104 mg/dL — ABNORMAL HIGH (ref 70–99)
Glucose-Capillary: 133 mg/dL — ABNORMAL HIGH (ref 70–99)

## 2023-12-13 LAB — CBC WITH DIFFERENTIAL/PLATELET
Abs Immature Granulocytes: 0.15 10*3/uL — ABNORMAL HIGH (ref 0.00–0.07)
Basophils Absolute: 0 10*3/uL (ref 0.0–0.1)
Basophils Relative: 0 %
Eosinophils Absolute: 0.1 10*3/uL (ref 0.0–0.5)
Eosinophils Relative: 1 %
HCT: 30.3 % — ABNORMAL LOW (ref 36.0–46.0)
Hemoglobin: 10.1 g/dL — ABNORMAL LOW (ref 12.0–15.0)
Immature Granulocytes: 1 %
Lymphocytes Relative: 17 %
Lymphs Abs: 2.1 10*3/uL (ref 0.7–4.0)
MCH: 31.9 pg (ref 26.0–34.0)
MCHC: 33.3 g/dL (ref 30.0–36.0)
MCV: 95.6 fL (ref 80.0–100.0)
Monocytes Absolute: 1 10*3/uL (ref 0.1–1.0)
Monocytes Relative: 8 %
Neutro Abs: 8.8 10*3/uL — ABNORMAL HIGH (ref 1.7–7.7)
Neutrophils Relative %: 73 %
Platelets: 227 10*3/uL (ref 150–400)
RBC: 3.17 MIL/uL — ABNORMAL LOW (ref 3.87–5.11)
RDW: 13.9 % (ref 11.5–15.5)
WBC: 12.1 10*3/uL — ABNORMAL HIGH (ref 4.0–10.5)
nRBC: 0 % (ref 0.0–0.2)

## 2023-12-13 LAB — PHOSPHORUS: Phosphorus: 3.3 mg/dL (ref 2.5–4.6)

## 2023-12-13 LAB — AEROBIC/ANAEROBIC CULTURE W GRAM STAIN (SURGICAL/DEEP WOUND): Gram Stain: NONE SEEN

## 2023-12-13 LAB — COMPREHENSIVE METABOLIC PANEL
ALT: 19 U/L (ref 0–44)
AST: 25 U/L (ref 15–41)
Albumin: 1.8 g/dL — ABNORMAL LOW (ref 3.5–5.0)
Alkaline Phosphatase: 70 U/L (ref 38–126)
Anion gap: 10 (ref 5–15)
BUN: 13 mg/dL (ref 8–23)
CO2: 21 mmol/L — ABNORMAL LOW (ref 22–32)
Calcium: 7.9 mg/dL — ABNORMAL LOW (ref 8.9–10.3)
Chloride: 105 mmol/L (ref 98–111)
Creatinine, Ser: 0.53 mg/dL (ref 0.44–1.00)
GFR, Estimated: >60 mL/min (ref >60)
Glucose, Bld: 177 mg/dL — ABNORMAL HIGH (ref 70–99)
Potassium: 4 mmol/L (ref 3.5–5.1)
Sodium: 136 mmol/L (ref 135–145)
Total Bilirubin: 0.5 mg/dL (ref 0.0–1.2)
Total Protein: 4.6 g/dL — ABNORMAL LOW (ref 6.5–8.1)

## 2023-12-13 LAB — MAGNESIUM: Magnesium: 1.6 mg/dL — ABNORMAL LOW (ref 1.7–2.4)

## 2023-12-13 LAB — CK: Total CK: 44 U/L (ref 38–234)

## 2023-12-13 LAB — ECHO TEE

## 2023-12-13 SURGERY — TRANSESOPHAGEAL ECHOCARDIOGRAM (TEE) (CATHLAB)
Anesthesia: Monitor Anesthesia Care

## 2023-12-13 MED ORDER — PROPOFOL 10 MG/ML IV BOLUS
INTRAVENOUS | Status: DC | PRN
  Administered 2023-12-13: 20 mg via INTRAVENOUS
  Administered 2023-12-13: 50 mg via INTRAVENOUS

## 2023-12-13 MED ORDER — MAGNESIUM SULFATE 2 GM/50ML IV SOLN
2.0000 g | Freq: Once | INTRAVENOUS | Status: AC
  Administered 2023-12-13: 2 g via INTRAVENOUS
  Filled 2023-12-13: qty 50

## 2023-12-13 MED ORDER — LIDOCAINE 2% (20 MG/ML) 5 ML SYRINGE
INTRAMUSCULAR | Status: DC | PRN
  Administered 2023-12-13: 50 mg via INTRAVENOUS

## 2023-12-13 MED ORDER — PROPOFOL 500 MG/50ML IV EMUL
INTRAVENOUS | Status: DC | PRN
  Administered 2023-12-13: 50 ug/kg/min via INTRAVENOUS

## 2023-12-13 MED ORDER — PHENYLEPHRINE 80 MCG/ML (10ML) SYRINGE FOR IV PUSH (FOR BLOOD PRESSURE SUPPORT)
PREFILLED_SYRINGE | INTRAVENOUS | Status: DC | PRN
  Administered 2023-12-13: 80 ug via INTRAVENOUS

## 2023-12-13 NOTE — Plan of Care (Signed)

## 2023-12-13 NOTE — Progress Notes (Signed)
Echocardiogram Echocardiogram Transesophageal has been performed.  Jill Higgins 12/13/2023, 2:12 PM

## 2023-12-13 NOTE — CV Procedure (Signed)
Brief TEE Note  LVEF 60-65% No LA/LAA thrombus or masses No evidence of endocarditis.   For additional details see full report.  Jill Higgins C. [name], MD, [facility] 12/13/2023 2:08 PM

## 2023-12-13 NOTE — Progress Notes (Addendum)
PROGRESS NOTE    [name]  ZOX:[id number] DOB: Aug 23, 1946 DOA: 12/07/2023 PCP: Merri Brunette, MD   Brief Narrative:  Jill Higgins is a 78 y.o. female with a history of hypertension, diabetes mellitus type 2, depression, anxiety, PAD with thrombosis of right extremity iliac/common femoral and disabling claudication status post axillofemoral bypass graft.  Patient presented secondary to discharge from groin incision with associated chills concerning for evidence of infection.  Patient met sepsis criteria on admission to start empiric antibiotics.  Blood cultures obtained prior to antibiotics.  Vascular surgery consulted with plan for surgical management.   **Interim History She is found to have MRSA bacteremia and ID has been consulted and has been changed to IV Dapto.  She underwent surgical intervention for her right axillofemoral bypass graft infection.  PT OT recommending SNF.  Assessment and Plan:  Severe Sepsis -Present on admission with associated lactic acidosis.  -Secondary to graft infection.  -Blood cultures (1/17) obtained on admission and are significant for MRSA.  -Repeat CX from 1/19 showing NGTD at 4 Days -Empiric Vancomycin and Cefepime started and transitioned to Vancomycin monotherapy and now Daptomycin.  -ID consulted.   Right Axillo-Femoral Bypass Graft Infection -Present on admission. Surgery performed on 12/26.  -Vascular surgery consulted and are following.  -WBC Trend had improved but slightly trended up: Recent Labs  Lab 12/07/23 2024 12/08/23 0529 12/09/23 0307 12/10/23 0831 12/11/23 0335 12/12/23 0324 12/13/23 1014  WBC 24.9* 17.4* 13.9* 11.7* 7.9 9.4 12.1*  -Cefepime discontinued. Patient underwent removal of bypass on 1/19; culture significant for GPCs -Vancomycin changed to IV Daptomycin by ID -Follow-up graft culture data and it shows:  ABUNDANT METHICILLIN RESISTANT STAPHYLOCOCCUS AUREUS NO ANAEROBES ISOLATED; CULTURE IN PROGRESS FOR 5  DAYS   Report Status PENDING  Organism ID, Bacteria METHICILLIN RESISTANT STAPHYLOCOCCUS AUREUS  Resulting Agency [facility]     Susceptibility    Methicillin resistant staphylococcus aureus    MIC    CIPROFLOXACIN >=8 RESISTANT Resistant    CLINDAMYCIN <=0.25 SENS... Sensitive    ERYTHROMYCIN <=0.25 SENS... Sensitive    GENTAMICIN <=0.5 SENSI... Sensitive    Inducible Clindamycin NEGATIVE Sensitive    LINEZOLID 2 SENSITIVE Sensitive    OXACILLIN >=4 RESISTANT Resistant    RIFAMPIN <=0.5 SENSI... Sensitive    TETRACYCLINE <=1 SENSITIVE Sensitive    TRIMETH/SULFA <=10 SENSIT... Sensitive    VANCOMYCIN <=0.5 SENSI... Sensitive      -Right forearm and Hand edematous will check RUE Duplex to Evaluate for DVT -Right Groin VAC to be changed by [facility] RN tomorrow and twice weekly    MRSA Bacteremia -Source is likely right groin wound. ID consulted -Patient was managed on Vancomycin. Repeat blood cultures (1/19) obtained and are no growth to date. Transthoracic Echocardiogram significant for no evidence of valvular vegetations. -ID recommendations: Vancomycin now changed to Daptomycin on 12/11/23(plan for 4-6 weeks), -Cardiology consulted for Transesophageal Echocardiogram which was recommended by ID and this was done today and showed now LA/LAA Thrombus or masses with EF of 60-65% -Per ID if repeat blood cultures negative for 2 days and they remain negative that is okay from an infectious disease standpoint to place PICC line; Since NGTD at 4 Days ID states ok to Place PICC   PAD -S/p axillo-femoral bypass graft.  -Patient follows with vascular surgery as an outpatient.  -LDL is down to 15. -Continue Atorvastatin 80 mg po Daily, Aspirin 81 mg po qHS and Clopidogrel 75 mg po Daily    Primary Hypertension -  Patient is on Amlodipine, Losartan and Toprol Xl as an outpatient. -Antihypertensives held on admission secondary to hypotension. -Continue to Monitor BP per Protocol -Last BP  reading was 138/55   Diabetes Mellitus, Type 2 -Uncontrolled with hyperglycemia.  -Hold home Metformin 500 mg po BID -Last hemoglobin A1C of 9.5%.  -Patient is managed on insulin glargine, insulin aspart, and metformin as an outpatient and started on SSI on admission. Blood sugar low-normal initially, now worsening. -Continue Sensitive Novolog SSI AC and  Semglee 5 units daily -CBG Trend: Recent Labs  Lab 12/12/23 0610 12/12/23 1206 12/12/23 1632 12/12/23 2137 12/13/23 0639 12/13/23 1112 12/13/23 1628  GLUCAP 78 131* 149* 232* 149* 176* 104*    Depression and Anxiety -Continue Duloxetine 60 mg po Daily  Metabolic Acidosis -Mild. CO2 is now 21, AG is 10 , Chloride Level is now 105 -Continue to Monitor and Trend and repeat CMP in the AM   Insomnia -Continue Temazepam 30 mg po qHSprn Sleep  Hypokalemia -Patient's K+ Level Trend: Recent Labs  Lab 12/09/23 0307 12/09/23 0914 12/09/23 1045 12/10/23 0355 12/11/23 0335 12/12/23 0324 12/13/23 1014  K 2.7* 3.3* 3.9 3.7 3.2* 3.5 4.0  -Continue to Monitor and Replete as Necessary -Repeat CMP in the AM   Hypomagnesemia -Patient's Mag Level Trend: Recent Labs  Lab 12/08/23 0529 12/10/23 0355 12/12/23 0324 12/13/23 1014  MG 1.0* 2.0 1.5* 1.6*  -Replete with IV Mag Sulfate 2 grams again -Continue to Monitor and Replete as Necessary -Repeat Mag in the AM   ABLA and Perioperative Blood loss superimposed on Normocytic Anemia -Secondary to vascular surgery. Patient with hemoglobin down to 7.1 g/dL requiring a transfusion of 2 units of PRBC. Posttransfusion hemoglobin of 10.0, down to 8.2 today  -Hgb/Hct Trend: Recent Labs  Lab 12/09/23 0307 12/09/23 0914 12/09/23 1045 12/10/23 0831 12/11/23 0335 12/12/23 0324 12/13/23 1014  HGB 10.3* 9.2* 7.1* 10.0* 8.6* 8.2* 10.1*  HCT 30.7* 27.0* 21.0* 28.4* 24.6* 24.4* 30.3*  MCV 93.0  --   --  91.6 93.2 94.2 95.6  -Continue to Monitor for S/Sx of Bleeding; No overt bleeding  noted -Repeat CBC in the AM   Hypoalbuminemia -Patient's Albumin Trend: Recent Labs  Lab 12/07/23 2024 12/08/23 0529 12/09/23 0307 12/10/23 0355 12/11/23 0335 12/13/23 1014  ALBUMIN 2.4* 1.7* 1.7* 1.8* 1.7* 1.8*  -Continue to Monitor and Trend and repeat CMP in the AM    DVT prophylaxis: heparin injection 5,000 Units Start: 12/12/23 0800 SCD's Start: 12/09/23 1835 SCDs Start: 12/07/23 2343    Code Status: Full Code Family Communication: No family present at bedside  Disposition Plan:  Level of care: Progressive Status is: Inpatient Remains inpatient appropriate because: Needs further clinical improvement and clearance by Specialists    Consultants:  Vascular Surgery Infectious Diseases  Cardiology for TEE  Procedures:  As delineated as above  Procedure by Dr. Chestine Spore: 1.  Harvest of left arm basilic vein 2.  Redo exposure of right axillary artery 3.  Excision of right axillary artery to femoral artery bypass 4.  Vein patch angioplasty right axillary artery 5.  Vein patch angioplasty of right femoral artery distal bypass 6.  Vac right groin  Antimicrobials:  Anti-infectives (From admission, onward)    Start     Dose/Rate Route Frequency Ordered Stop   12/11/23 1400  DAPTOmycin (CUBICIN) IVPB 500 mg/67mL premix        500 mg 100 mL/hr over 30 Minutes Intravenous Daily 12/11/23 0836     12/10/23 1400  DAPTOmycin (  CUBICIN) 400 mg in sodium chloride 0.9 % IVPB  Status:  Discontinued        8 mg/kg  50 kg 116 mL/hr over 30 Minutes Intravenous Daily 12/10/23 1111 12/11/23 0836   12/09/23 2100  vancomycin (VANCOCIN) 750 mg in sodium chloride 0.9 % 250 mL IVPB  Status:  Discontinued        750 mg 265 mL/hr over 60 Minutes Intravenous Every 24 hours 12/09/23 1759 12/10/23 1111   12/09/23 0800  vancomycin (VANCOCIN) IVPB 1000 mg/200 mL premix  Status:  Discontinued        1,000 mg 200 mL/hr over 60 Minutes Intravenous  Once 12/09/23 0746 12/09/23 0807   12/09/23 0748   vancomycin (VANCOCIN) 1-5 GM/200ML-% IVPB       Note to Pharmacy: Aquilla Hacker M: cabinet override      12/09/23 0748 12/09/23 1959   12/08/23 2100  vancomycin (VANCOCIN) 750 mg in sodium chloride 0.9 % 250 mL IVPB  Status:  Discontinued        750 mg 250 mL/hr over 60 Minutes Intravenous Every 24 hours 12/07/23 2359 12/09/23 1759   12/07/23 2100  vancomycin (VANCOCIN) IVPB 1000 mg/200 mL premix        1,000 mg 200 mL/hr over 60 Minutes Intravenous  Once 12/07/23 2048 12/07/23 2313   12/07/23 2100  ceFEPIme (MAXIPIME) 2 g in sodium chloride 0.9 % 100 mL IVPB  Status:  Discontinued        2 g 200 mL/hr over 30 Minutes Intravenous Every 12 hours 12/07/23 2053 12/08/23 1519       Subjective: Seen and examined at bedside and was feeling a little fatigued.  Denied any complaints and not a bowel movement yet.  No nausea or vomiting.  Going down for TEE later today.  No other concerns or complaints at this time.  Objective: Vitals:   12/13/23 1400 12/13/23 1420 12/13/23 1430 12/13/23 1626  BP: 106/70 103/63 122/62 (!) 138/55  Pulse: 82 70 75 60  Resp: 20 18 19 13   Temp:  98 F (36.7 C)  (!) 97.5 F (36.4 C)  TempSrc:  Temporal  Oral  SpO2: 97%   99%  Weight:      Height:        Intake/Output Summary (Last 24 hours) at 12/13/2023 1715 Last data filed at 12/13/2023 1402 Gross per 24 hour  Intake 100 ml  Output 550 ml  Net -450 ml   Filed Weights   12/07/23 2006 12/09/23 0648  Weight: 50 kg 50 kg   Examination: Physical Exam:  Constitutional: Thin female who is chronically ill-appearing in no acute distress.  Weak and fatigued though Respiratory: Diminished to auscultation bilaterally with some coarse breath sounds, no wheezing, rales, rhonchi or crackles. Normal respiratory effort and patient is not tachypenic.  Unlabored breathing Cardiovascular: RRR, no murmurs / rubs / gallops. S1 and S2 auscultated.  Has some right arm swelling Abdomen: Soft, non-tender, non-distended.   Bowel sounds positive.  GU: Deferred. Musculoskeletal: No clubbing / cyanosis of digits/nails. No joint deformity upper and lower extremities.  Neurologic: CN 2-12 grossly intact with no focal deficits. Romberg sign and cerebellar reflexes not assessed.  Psychiatric: Fatigued appearing but awake and alert  Data Reviewed: I have personally reviewed following labs and imaging studies  CBC: Recent Labs  Lab 12/07/23 2024 12/07/23 2030 12/09/23 0307 12/09/23 0914 12/09/23 1045 12/10/23 0831 12/11/23 0335 12/12/23 0324 12/13/23 1014  WBC 24.9*   < > 13.9*  --   --  11.7* 7.9 9.4 12.1*  NEUTROABS 20.9*  --   --   --   --   --   --   --  8.8*  HGB 13.0   < > 10.3*   < > 7.1* 10.0* 8.6* 8.2* 10.1*  HCT 39.4   < > 30.7*   < > 21.0* 28.4* 24.6* 24.4* 30.3*  MCV 94.7   < > 93.0  --   --  91.6 93.2 94.2 95.6  PLT 281   < > 172  --   --  108* 115* 148* 227   < > = values in this interval not displayed.   Basic Metabolic Panel: Recent Labs  Lab 12/08/23 0529 12/09/23 0307 12/09/23 0914 12/09/23 1045 12/10/23 0355 12/11/23 0335 12/12/23 0324 12/13/23 1014  NA 136 134* 135 136 137 141 135 136  K 3.2* 2.7* 3.3* 3.9 3.7 3.2* 3.5 4.0  CL 107 106 102  --  107 110 107 105  CO2 22 21*  --   --  24 21* 23 21*  GLUCOSE 118* 92 176*  --  117* 232* 105* 177*  BUN 11 10 8   --  7* 5* 9 13  CREATININE 0.93 0.81 0.60  --  0.71 0.76 0.55 0.53  CALCIUM 7.1* 7.1*  --   --  7.4* 7.2* 7.2* 7.9*  MG 1.0*  --   --   --  2.0  --  1.5* 1.6*  PHOS  --   --   --   --   --   --   --  3.3   GFR: Estimated Creatinine Clearance: 45.7 mL/min (by C-G formula based on SCr of 0.53 mg/dL). Liver Function Tests: Recent Labs  Lab 12/08/23 0529 12/09/23 0307 12/10/23 0355 12/11/23 0335 12/13/23 1014  AST 46* 34 20 23 25   ALT 25 22 15 17 19   ALKPHOS 52 62 38 45 70  BILITOT 0.6 0.5 0.8 0.6 0.5  PROT 4.2* 4.3* 3.7* 3.8* 4.6*  ALBUMIN 1.7* 1.7* 1.8* 1.7* 1.8*   No results for input(s): "LIPASE", "AMYLASE"  in the last 168 hours. No results for input(s): "AMMONIA" in the last 168 hours. Coagulation Profile: Recent Labs  Lab 12/07/23 2024  INR 1.3*   Cardiac Enzymes: Recent Labs  Lab 12/08/23 0529 12/11/23 0335 12/13/23 1014  CKTOTAL 270* 114 44   BNP (last 3 results) No results for input(s): "PROBNP" in the last 8760 hours. HbA1C: No results for input(s): "HGBA1C" in the last 72 hours. CBG: Recent Labs  Lab 12/12/23 1632 12/12/23 2137 12/13/23 0639 12/13/23 1112 12/13/23 1628  GLUCAP 149* 232* 149* 176* 104*   Lipid Profile: No results for input(s): "CHOL", "HDL", "LDLCALC", "TRIG", "CHOLHDL", "LDLDIRECT" in the last 72 hours. Thyroid Function Tests: No results for input(s): "TSH", "T4TOTAL", "FREET4", "T3FREE", "THYROIDAB" in the last 72 hours. Anemia Panel: No results for input(s): "VITAMINB12", "FOLATE", "FERRITIN", "TIBC", "IRON", "RETICCTPCT" in the last 72 hours. Sepsis Labs: Recent Labs  Lab 12/07/23 2036 12/08/23 0052 12/08/23 0529  LATICACIDVEN 5.7* 4.2* 1.6   Recent Results (from the past 240 hours)  MIC (1 Drug)-Blood Culture; 12/07/2023; BLOOD; Staph aureus; Daptomycin     Status: None   Collection Time: 12/07/23 11:55 AM   Specimen: BLOOD  Result Value Ref Range Status   Min Inhibitory Conc (1 Drug) Preliminary report  Final    Comment: (NOTE) Performed At: [facility] [address] [place], [place] [id number] Jolene Schimke MD ZH:[id number]    Source BLOOD  Final    Comment: Performed at [facility], [address] [place], [place] [address]  Culture, blood (Routine x 2)     Status: Abnormal (Preliminary result)   Collection Time: 12/07/23  8:17 PM   Specimen: BLOOD  Result Value Ref Range Status   Specimen Description BLOOD LEFT ANTECUBITAL  Final   Special Requests   Final    BOTTLES DRAWN AEROBIC AND ANAEROBIC Blood Culture results may not be optimal due to an inadequate volume of blood received in culture bottles    Culture  Setup Time   Final    GRAM POSITIVE COCCI ANAEROBIC BOTTLE ONLY CRITICAL RESULT CALLED TO, READ BACK BY AND VERIFIED WITH: PHARMD JESSICA MILLEN [id number] AT 1411 BY EC    Culture (A)  Final    METHICILLIN RESISTANT STAPHYLOCOCCUS AUREUS CULTURE REINCUBATED FOR BETTER GROWTH Performed at [facility], [address], [place], [place] [address]    Report Status PENDING  Incomplete   Organism ID, Bacteria METHICILLIN RESISTANT STAPHYLOCOCCUS AUREUS  Final      Susceptibility   Methicillin resistant staphylococcus aureus - MIC*    CIPROFLOXACIN >=8 RESISTANT Resistant     ERYTHROMYCIN <=0.25 SENSITIVE Sensitive     GENTAMICIN <=0.5 SENSITIVE Sensitive     OXACILLIN >=4 RESISTANT Resistant     TETRACYCLINE <=1 SENSITIVE Sensitive     VANCOMYCIN <=0.5 SENSITIVE Sensitive     TRIMETH/SULFA <=10 SENSITIVE Sensitive     CLINDAMYCIN <=0.25 SENSITIVE Sensitive     RIFAMPIN <=0.5 SENSITIVE Sensitive     Inducible Clindamycin NEGATIVE Sensitive     LINEZOLID 2 SENSITIVE Sensitive     * METHICILLIN RESISTANT STAPHYLOCOCCUS AUREUS  Blood Culture ID Panel (Reflexed)     Status: Abnormal   Collection Time: 12/07/23  8:17 PM  Result Value Ref Range Status   Enterococcus faecalis NOT DETECTED NOT DETECTED Final   Enterococcus Faecium NOT DETECTED NOT DETECTED Final   Listeria monocytogenes NOT DETECTED NOT DETECTED Final   Staphylococcus species DETECTED (A) NOT DETECTED Final    Comment: CRITICAL RESULT CALLED TO, READ BACK BY AND VERIFIED WITH: PHARMD JESSICA MILLEN [id number] AT 1411 BY EC    Staphylococcus aureus (BCID) DETECTED (A) NOT DETECTED Final    Comment: Methicillin (oxacillin)-resistant Staphylococcus aureus (MRSA). MRSA is predictably resistant to beta-lactam antibiotics (except ceftaroline). Preferred therapy is vancomycin unless clinically contraindicated. Patient requires contact precautions if  hospitalized. CRITICAL RESULT CALLED TO, READ BACK BY AND VERIFIED  WITH: PHARMD JESSICA MILLEN [id number] AT 1411 BY EC    Staphylococcus epidermidis NOT DETECTED NOT DETECTED Final   Staphylococcus lugdunensis NOT DETECTED NOT DETECTED Final   Streptococcus species NOT DETECTED NOT DETECTED Final   Streptococcus agalactiae NOT DETECTED NOT DETECTED Final   Streptococcus pneumoniae NOT DETECTED NOT DETECTED Final   Streptococcus pyogenes NOT DETECTED NOT DETECTED Final   A.calcoaceticus-baumannii NOT DETECTED NOT DETECTED Final   Bacteroides fragilis NOT DETECTED NOT DETECTED Final   Enterobacterales NOT DETECTED NOT DETECTED Final   Enterobacter cloacae complex NOT DETECTED NOT DETECTED Final   Escherichia coli NOT DETECTED NOT DETECTED Final   Klebsiella aerogenes NOT DETECTED NOT DETECTED Final   Klebsiella oxytoca NOT DETECTED NOT DETECTED Final   Klebsiella pneumoniae NOT DETECTED NOT DETECTED Final   Proteus species NOT DETECTED NOT DETECTED Final   Salmonella species NOT DETECTED NOT DETECTED Final   Serratia marcescens NOT DETECTED NOT DETECTED Final   Haemophilus influenzae NOT DETECTED NOT DETECTED Final  Neisseria meningitidis NOT DETECTED NOT DETECTED Final   Pseudomonas aeruginosa NOT DETECTED NOT DETECTED Final   Stenotrophomonas maltophilia NOT DETECTED NOT DETECTED Final   Candida albicans NOT DETECTED NOT DETECTED Final   Candida auris NOT DETECTED NOT DETECTED Final   Candida glabrata NOT DETECTED NOT DETECTED Final   Candida krusei NOT DETECTED NOT DETECTED Final   Candida parapsilosis NOT DETECTED NOT DETECTED Final   Candida tropicalis NOT DETECTED NOT DETECTED Final   Cryptococcus neoformans/gattii NOT DETECTED NOT DETECTED Final   Meth resistant mecA/C and MREJ DETECTED (A) NOT DETECTED Final    Comment: CRITICAL RESULT CALLED TO, READ BACK BY AND VERIFIED WITH: PHARMD JESSICA MILLEN [id number] AT 1411 BY EC Performed at [facility], [address] [place], [place] [address]   Culture, blood (Routine x 2)     Status:  None   Collection Time: 12/07/23  8:20 PM   Specimen: BLOOD RIGHT ARM  Result Value Ref Range Status   Specimen Description BLOOD RIGHT ARM  Final   Special Requests   Final    BOTTLES DRAWN AEROBIC AND ANAEROBIC Blood Culture results may not be optimal due to an inadequate volume of blood received in culture bottles   Culture   Final    NO GROWTH 5 DAYS Performed at [facility], [address] [place], [place] [address]    Report Status 12/12/2023 FINAL  Final  Surgical pcr screen     Status: Abnormal   Collection Time: 12/08/23  7:57 PM   Specimen: Nasal Mucosa; Nasal Swab  Result Value Ref Range Status   MRSA, PCR POSITIVE (A) NEGATIVE Final    Comment: RESULT CALLED TO, READ BACK BY AND VERIFIED WITH: T IRBY,RN@2258  12/08/23 MK    Staphylococcus aureus POSITIVE (A) NEGATIVE Final    Comment: (NOTE) The Xpert SA Assay (FDA approved for NASAL specimens in patients 61 years of age and older), is one component of a comprehensive surveillance program. It is not intended to diagnose infection nor to guide or monitor treatment. Performed at [facility], [address] [place], [place] [address]   Culture, blood (Routine X 2) w Reflex to ID Panel     Status: None (Preliminary result)   Collection Time: 12/09/23  3:07 AM   Specimen: BLOOD RIGHT ARM  Result Value Ref Range Status   Specimen Description BLOOD RIGHT ARM  Final   Special Requests   Final    BOTTLES DRAWN AEROBIC AND ANAEROBIC Blood Culture results may not be optimal due to an inadequate volume of blood received in culture bottles   Culture   Final    NO GROWTH 4 DAYS Performed at [facility], [address] [place], [place] [address]    Report Status PENDING  Incomplete  Culture, blood (Routine X 2) w Reflex to ID Panel     Status: None (Preliminary result)   Collection Time: 12/09/23  3:07 AM   Specimen: BLOOD RIGHT HAND  Result Value Ref Range Status   Specimen Description BLOOD RIGHT  HAND  Final   Special Requests   Final    BOTTLES DRAWN AEROBIC AND ANAEROBIC Blood Culture results may not be optimal due to an inadequate volume of blood received in culture bottles   Culture   Final    NO GROWTH 4 DAYS Performed at [facility], [address], [place], [place] [address]    Report Status PENDING  Incomplete  Aerobic/Anaerobic Culture w Gram Stain (surgical/deep wound)     Status: None   Collection Time: 12/09/23 10:16 AM   Specimen: Wound  Result Value Ref Range Status   Specimen Description WOUND  Final   Special Requests RIGHT GROIN  Final   Gram Stain   Final    NO WBC SEEN RARE GRAM POSITIVE COCCI IN PAIRS IN SINGLES    Culture   Final    MODERATE STAPHYLOCOCCUS AUREUS SUSCEPTIBILITIES PERFORMED ON PREVIOUS CULTURE WITHIN THE LAST 5 DAYS. NO ANAEROBES ISOLATED Performed at [facility], [address] [place], [place] [address]    Report Status 12/13/2023 FINAL  Final  Aerobic/Anaerobic Culture w Gram Stain (surgical/deep wound)     Status: None (Preliminary result)   Collection Time: 12/09/23 10:18 AM   Specimen: Wound  Result Value Ref Range Status   Specimen Description WOUND  Final   Special Requests portion of graft from right groin  Final   Gram Stain   Final    MODERATE WBC PRESENT,BOTH PMN AND MONONUCLEAR FEW GRAM POSITIVE COCCI IN PAIRS Performed at [facility], [address], [place], [place] [address]    Culture   Final    ABUNDANT METHICILLIN RESISTANT STAPHYLOCOCCUS AUREUS NO ANAEROBES ISOLATED; CULTURE IN PROGRESS FOR 5 DAYS    Report Status PENDING  Incomplete   Organism ID, Bacteria METHICILLIN RESISTANT STAPHYLOCOCCUS AUREUS  Final      Susceptibility   Methicillin resistant staphylococcus aureus - MIC*    CIPROFLOXACIN >=8 RESISTANT Resistant     ERYTHROMYCIN <=0.25 SENSITIVE Sensitive     GENTAMICIN <=0.5 SENSITIVE Sensitive     OXACILLIN >=4 RESISTANT Resistant     TETRACYCLINE <=1 SENSITIVE Sensitive      VANCOMYCIN <=0.5 SENSITIVE Sensitive     TRIMETH/SULFA <=10 SENSITIVE Sensitive     CLINDAMYCIN <=0.25 SENSITIVE Sensitive     RIFAMPIN <=0.5 SENSITIVE Sensitive     Inducible Clindamycin NEGATIVE Sensitive     LINEZOLID 2 SENSITIVE Sensitive     * ABUNDANT METHICILLIN RESISTANT STAPHYLOCOCCUS AUREUS  Aerobic/Anaerobic Culture w Gram Stain (surgical/deep wound)     Status: None (Preliminary result)   Collection Time: 12/09/23 10:23 AM   Specimen: Wound  Result Value Ref Range Status   Specimen Description WOUND  Final   Special Requests DISTAL PORTION OF GRAFT FROM RIGHT GROIN  Final   Gram Stain   Final    RARE WBC PRESENT, PREDOMINANTLY MONONUCLEAR NO ORGANISMS SEEN Performed at [facility], [address] [place], [place] [address]    Culture   Final    FEW STAPHYLOCOCCUS AUREUS SUSCEPTIBILITIES PERFORMED ON PREVIOUS CULTURE WITHIN THE LAST 5 DAYS. NO ANAEROBES ISOLATED; CULTURE IN PROGRESS FOR 5 DAYS    Report Status PENDING  Incomplete    Radiology Studies: [place] EKG SITE RITE Result Date: 12/13/2023 If Site Rite image not attached, placement could not be confirmed due to current cardiac rhythm.  ECHO TEE Result Date: 12/13/2023    TRANSESOPHOGEAL ECHO REPORT   Patient Name:   Jill Higgins Date of Exam: 12/13/2023 Medical Rec #:  [id number]       Height:       63.0 in Accession #:    [id number]      Weight:       110.2 lb Date of Birth:  1946-11-14        BSA:          1.501 m Patient Age:  77 years        BP:           124/54 mmHg Patient Gender: F               HR:           76 bpm. Exam Location:  Inpatient Procedure: Transesophageal Echo, Cardiac Doppler and Color Doppler Indications:     Endocarditis  History:         Patient has prior history of Echocardiogram examinations, most                  recent 09/08/2024. PAD; Risk Factors:Hypertension, Diabetes and                  Dyslipidemia.  Sonographer:     Lucendia Herrlich RCS Referring Phys:  [id number] [facility] [place]  Diagnosing Phys: Chilton Si MD PROCEDURE: After discussion of the risks and benefits of a TEE, an informed consent was obtained from the patient. The transesophogeal probe was passed without difficulty through the esophogus of the patient. Imaged were obtained with the patient in a left lateral decubitus position. Sedation performed by different physician. The patient was monitored while under deep sedation. Anesthestetic sedation was provided intravenously by Anesthesiology: 90mg  of Propofol, 50mg  of Lidocaine. The patient's vital  signs; including heart rate, blood pressure, and oxygen saturation; remained stable throughout the procedure. The patient developed no complications during the procedure.  IMPRESSIONS  1. Left ventricular ejection fraction, by estimation, is 60 to 65%. The left ventricle has normal function. The left ventricle has no regional wall motion abnormalities.  2. Right ventricular systolic function is normal. The right ventricular size is normal.  3. No left atrial/left atrial appendage thrombus was detected.  4. The mitral valve is normal in structure. Trivial mitral valve regurgitation. No evidence of mitral stenosis.  5. The aortic valve is tricuspid. Aortic valve regurgitation is not visualized. No aortic stenosis is present.  6. The inferior vena cava is normal in size with greater than 50% respiratory variability, suggesting right atrial pressure of 3 mmHg. Conclusion(s)/Recommendation(s): Normal biventricular function without evidence of hemodynamically significant valvular heart disease. No evidence of vegetation/infective endocarditis on this transesophageael echocardiogram. FINDINGS  Left Ventricle: Left ventricular ejection fraction, by estimation, is 60 to 65%. The left ventricle has normal function. The left ventricle has no regional wall motion abnormalities. The left ventricular internal cavity size was normal in size. There is  no left ventricular hypertrophy. Right  Ventricle: The right ventricular size is normal. No increase in right ventricular wall thickness. Right ventricular systolic function is normal. Left Atrium: Left atrial size was normal in size. No left atrial/left atrial appendage thrombus was detected. Right Atrium: Right atrial size was normal in size. Pericardium: There is no evidence of pericardial effusion. Mitral Valve: The mitral valve is normal in structure. Trivial mitral valve regurgitation. No evidence of mitral valve stenosis. Tricuspid Valve: The tricuspid valve is normal in structure. Tricuspid valve regurgitation is trivial. No evidence of tricuspid stenosis. Aortic Valve: The aortic valve is tricuspid. Aortic valve regurgitation is not visualized. No aortic stenosis is present. Pulmonic Valve: The pulmonic valve was normal in structure. Pulmonic valve regurgitation is trivial. No evidence of pulmonic stenosis. Aorta: The aortic root is normal in size and structure. There is minimal (Grade I) atheroma plaque involving the descending aorta. Venous: The inferior vena cava is normal in size with greater than 50% respiratory variability, suggesting right atrial pressure of 3 mmHg.  IAS/Shunts: No atrial level shunt detected by color flow Doppler. Additional Comments: Spectral Doppler performed. TRICUSPID VALVE TR Peak grad:   22.8 mmHg TR Vmax:        239.00 cm/s Chilton Si MD Electronically signed by Chilton Si MD Signature Date/Time: 12/13/2023/2:23:52 PM    Final    EP STUDY Result Date: 12/13/2023 See surgical note for result.   Scheduled Meds:  aspirin EC  81 mg Oral QHS   atorvastatin  80 mg Oral Daily   clopidogrel  75 mg Oral Q breakfast   docusate sodium  100 mg Oral Daily   DULoxetine  60 mg Oral Daily   heparin injection (subcutaneous)  5,000 Units Subcutaneous Q8H   insulin aspart  0-9 Units Subcutaneous TID WC   insulin glargine-yfgn  5 Units Subcutaneous Daily   lactose free nutrition  237 mL Oral BID BM    multivitamin with minerals  1 tablet Oral Daily   mupirocin ointment  1 Application Nasal BID   pantoprazole  40 mg Oral BID   sodium chloride flush  3 mL Intravenous Q12H   Continuous Infusions:  DAPTOmycin Stopped (12/12/23 1626)   magnesium sulfate bolus IVPB     magnesium sulfate bolus IVPB      LOS: 6 days   Marguerita Merles, DO [facility] Available via Epic secure chat 7am-7pm After these hours, please refer to coverage provider listed on [email or web address] 12/13/2023, 5:15 PM

## 2023-12-13 NOTE — Progress Notes (Addendum)
At bedside for PICC placement. Upon assessment, restricted extremity band on left arm. Right axilla noted to be covered with an occlusive dressing, as well as staples noted on right upper chest. Pt  underwent several vascular excision and bypass grafting procedures, therefore, it is not recommended to proceed with PICC placement. Please reference Dr. Ophelia Charter surgical note from 12/09/23. Will follow up with attending provider on 12/14/23.

## 2023-12-13 NOTE — Progress Notes (Signed)
[facility]   Reason for visit: Follow up on bacteremia  Interval History: TEE has been done and no vegetation noted.  WBC 12.1. Pete blood cultures from January 19 remain no growth to date.  Physical Exam: Constitutional:  Vitals:   12/13/23 1420 12/13/23 1430  BP: 103/63 122/62  Pulse: 70 75  Resp: 18 19  Temp: 98 F (36.7 C)   SpO2:     patient appears in NAD, he is fatigued appearing Respiratory: Normal respiratory effort  Review of Systems: Constitutional: negative for fevers and chills  Lab Results  Component Value Date   WBC 12.1 (H) 12/13/2023   HGB 10.1 (L) 12/13/2023   HCT 30.3 (L) 12/13/2023   MCV 95.6 12/13/2023   PLT 227 12/13/2023    Lab Results  Component Value Date   CREATININE 0.53 12/13/2023   BUN 13 12/13/2023   NA 136 12/13/2023   K 4.0 12/13/2023   CL 105 12/13/2023   CO2 21 (L) 12/13/2023    Lab Results  Component Value Date   ALT 19 12/13/2023   AST 25 12/13/2023   ALKPHOS 70 12/13/2023     Microbiology: Recent Results (from the past 240 hours)  MIC (1 Drug)-Blood Culture; 12/07/2023; BLOOD; Staph aureus; Daptomycin     Status: None   Collection Time: 12/07/23 11:55 AM   Specimen: BLOOD  Result Value Ref Range Status   Min Inhibitory Conc (1 Drug) Preliminary report  Final    Comment: (NOTE) Performed At: [facility] [address] [place], [place] [id number] Jolene Schimke MD YN:[id number]    Source BLOOD  Final    Comment: Performed at [facility], [address], [place], [place] [address]  Culture, blood (Routine x 2)     Status: Abnormal (Preliminary result)   Collection Time: 12/07/23  8:17 PM   Specimen: BLOOD  Result Value Ref Range Status   Specimen Description BLOOD LEFT ANTECUBITAL  Final   Special Requests   Final    BOTTLES DRAWN AEROBIC AND ANAEROBIC Blood Culture results may not be optimal due to an inadequate volume of blood received in culture bottles   Culture   Setup Time   Final    GRAM POSITIVE COCCI ANAEROBIC BOTTLE ONLY CRITICAL RESULT CALLED TO, READ BACK BY AND VERIFIED WITH: PHARMD JESSICA MILLEN [id number] AT 1411 BY EC    Culture (A)  Final    METHICILLIN RESISTANT STAPHYLOCOCCUS AUREUS CULTURE REINCUBATED FOR BETTER GROWTH Performed at [facility], [address], [place], [place] [address]    Report Status PENDING  Incomplete   Organism ID, Bacteria METHICILLIN RESISTANT STAPHYLOCOCCUS AUREUS  Final      Susceptibility   Methicillin resistant staphylococcus aureus - MIC*    CIPROFLOXACIN >=8 RESISTANT Resistant     ERYTHROMYCIN <=0.25 SENSITIVE Sensitive     GENTAMICIN <=0.5 SENSITIVE Sensitive     OXACILLIN >=4 RESISTANT Resistant     TETRACYCLINE <=1 SENSITIVE Sensitive     VANCOMYCIN <=0.5 SENSITIVE Sensitive     TRIMETH/SULFA <=10 SENSITIVE Sensitive     CLINDAMYCIN <=0.25 SENSITIVE Sensitive     RIFAMPIN <=0.5 SENSITIVE Sensitive     Inducible Clindamycin NEGATIVE Sensitive     LINEZOLID 2 SENSITIVE Sensitive     * METHICILLIN RESISTANT STAPHYLOCOCCUS AUREUS  Blood Culture ID Panel (Reflexed)     Status: Abnormal   Collection Time: 12/07/23  8:17 PM  Result Value Ref Range Status   Enterococcus  faecalis NOT DETECTED NOT DETECTED Final   Enterococcus Faecium NOT DETECTED NOT DETECTED Final   Listeria monocytogenes NOT DETECTED NOT DETECTED Final   Staphylococcus species DETECTED (A) NOT DETECTED Final    Comment: CRITICAL RESULT CALLED TO, READ BACK BY AND VERIFIED WITH: PHARMD JESSICA MILLEN [id number] AT 1411 BY EC    Staphylococcus aureus (BCID) DETECTED (A) NOT DETECTED Final    Comment: Methicillin (oxacillin)-resistant Staphylococcus aureus (MRSA). MRSA is predictably resistant to beta-lactam antibiotics (except ceftaroline). Preferred therapy is vancomycin unless clinically contraindicated. Patient requires contact precautions if  hospitalized. CRITICAL RESULT CALLED TO, READ BACK BY AND VERIFIED WITH: PHARMD  JESSICA MILLEN [id number] AT 1411 BY EC    Staphylococcus epidermidis NOT DETECTED NOT DETECTED Final   Staphylococcus lugdunensis NOT DETECTED NOT DETECTED Final   Streptococcus species NOT DETECTED NOT DETECTED Final   Streptococcus agalactiae NOT DETECTED NOT DETECTED Final   Streptococcus pneumoniae NOT DETECTED NOT DETECTED Final   Streptococcus pyogenes NOT DETECTED NOT DETECTED Final   A.calcoaceticus-baumannii NOT DETECTED NOT DETECTED Final   Bacteroides fragilis NOT DETECTED NOT DETECTED Final   Enterobacterales NOT DETECTED NOT DETECTED Final   Enterobacter cloacae complex NOT DETECTED NOT DETECTED Final   Escherichia coli NOT DETECTED NOT DETECTED Final   Klebsiella aerogenes NOT DETECTED NOT DETECTED Final   Klebsiella oxytoca NOT DETECTED NOT DETECTED Final   Klebsiella pneumoniae NOT DETECTED NOT DETECTED Final   Proteus species NOT DETECTED NOT DETECTED Final   Salmonella species NOT DETECTED NOT DETECTED Final   Serratia marcescens NOT DETECTED NOT DETECTED Final   Haemophilus influenzae NOT DETECTED NOT DETECTED Final   Neisseria meningitidis NOT DETECTED NOT DETECTED Final   Pseudomonas aeruginosa NOT DETECTED NOT DETECTED Final   Stenotrophomonas maltophilia NOT DETECTED NOT DETECTED Final   Candida albicans NOT DETECTED NOT DETECTED Final   Candida auris NOT DETECTED NOT DETECTED Final   Candida glabrata NOT DETECTED NOT DETECTED Final   Candida krusei NOT DETECTED NOT DETECTED Final   Candida parapsilosis NOT DETECTED NOT DETECTED Final   Candida tropicalis NOT DETECTED NOT DETECTED Final   Cryptococcus neoformans/gattii NOT DETECTED NOT DETECTED Final   Meth resistant mecA/C and MREJ DETECTED (A) NOT DETECTED Final    Comment: CRITICAL RESULT CALLED TO, READ BACK BY AND VERIFIED WITH: PHARMD JESSICA MILLEN [id number] AT 1411 BY EC Performed at [facility], [address] [place], [place] [address]   Culture, blood (Routine x 2)     Status: None    Collection Time: 12/07/23  8:20 PM   Specimen: BLOOD RIGHT ARM  Result Value Ref Range Status   Specimen Description BLOOD RIGHT ARM  Final   Special Requests   Final    BOTTLES DRAWN AEROBIC AND ANAEROBIC Blood Culture results may not be optimal due to an inadequate volume of blood received in culture bottles   Culture   Final    NO GROWTH 5 DAYS Performed at [facility], [address], [place], [place] [address]    Report Status 12/12/2023 FINAL  Final  Surgical pcr screen     Status: Abnormal   Collection Time: 12/08/23  7:57 PM   Specimen: Nasal Mucosa; Nasal Swab  Result Value Ref Range Status   MRSA, PCR POSITIVE (A) NEGATIVE Final    Comment: RESULT CALLED TO, READ BACK BY AND VERIFIED WITH: T IRBY,RN@2258  12/08/23 MK    Staphylococcus aureus POSITIVE (A) NEGATIVE Final    Comment: (NOTE) The Xpert SA  Assay (FDA approved for NASAL specimens in patients 59 years of age and older), is one component of a comprehensive surveillance program. It is not intended to diagnose infection nor to guide or monitor treatment. Performed at [facility], [address] [place], [place] [address]   Culture, blood (Routine X 2) w Reflex to ID Panel     Status: None (Preliminary result)   Collection Time: 12/09/23  3:07 AM   Specimen: BLOOD RIGHT ARM  Result Value Ref Range Status   Specimen Description BLOOD RIGHT ARM  Final   Special Requests   Final    BOTTLES DRAWN AEROBIC AND ANAEROBIC Blood Culture results may not be optimal due to an inadequate volume of blood received in culture bottles   Culture   Final    NO GROWTH 4 DAYS Performed at [facility], [address], [place], [place] [address]    Report Status PENDING  Incomplete  Culture, blood (Routine X 2) w Reflex to ID Panel     Status: None (Preliminary result)   Collection Time: 12/09/23  3:07 AM   Specimen: BLOOD RIGHT HAND  Result Value Ref Range Status   Specimen Description BLOOD RIGHT HAND  Final    Special Requests   Final    BOTTLES DRAWN AEROBIC AND ANAEROBIC Blood Culture results may not be optimal due to an inadequate volume of blood received in culture bottles   Culture   Final    NO GROWTH 4 DAYS Performed at [facility], [address] [place], [place] [address]    Report Status PENDING  Incomplete  Aerobic/Anaerobic Culture w Gram Stain (surgical/deep wound)     Status: None   Collection Time: 12/09/23 10:16 AM   Specimen: Wound  Result Value Ref Range Status   Specimen Description WOUND  Final   Special Requests RIGHT GROIN  Final   Gram Stain   Final    NO WBC SEEN RARE GRAM POSITIVE COCCI IN PAIRS IN SINGLES    Culture   Final    MODERATE STAPHYLOCOCCUS AUREUS SUSCEPTIBILITIES PERFORMED ON PREVIOUS CULTURE WITHIN THE LAST 5 DAYS. NO ANAEROBES ISOLATED Performed at [facility], [address], [place], [place] [address]    Report Status 12/13/2023 FINAL  Final  Aerobic/Anaerobic Culture w Gram Stain (surgical/deep wound)     Status: None (Preliminary result)   Collection Time: 12/09/23 10:18 AM   Specimen: Wound  Result Value Ref Range Status   Specimen Description WOUND  Final   Special Requests portion of graft from right groin  Final   Gram Stain   Final    MODERATE WBC PRESENT,BOTH PMN AND MONONUCLEAR FEW GRAM POSITIVE COCCI IN PAIRS Performed at [facility], [address], [place], [place] [address]    Culture   Final    ABUNDANT METHICILLIN RESISTANT STAPHYLOCOCCUS AUREUS NO ANAEROBES ISOLATED; CULTURE IN PROGRESS FOR 5 DAYS    Report Status PENDING  Incomplete   Organism ID, Bacteria METHICILLIN RESISTANT STAPHYLOCOCCUS AUREUS  Final      Susceptibility   Methicillin resistant staphylococcus aureus - MIC*    CIPROFLOXACIN >=8 RESISTANT Resistant     ERYTHROMYCIN <=0.25 SENSITIVE Sensitive     GENTAMICIN <=0.5 SENSITIVE Sensitive     OXACILLIN >=4 RESISTANT Resistant     TETRACYCLINE <=1 SENSITIVE Sensitive     VANCOMYCIN  <=0.5 SENSITIVE Sensitive     TRIMETH/SULFA <=10 SENSITIVE Sensitive     CLINDAMYCIN <=  0.25 SENSITIVE Sensitive     RIFAMPIN <=0.5 SENSITIVE Sensitive     Inducible Clindamycin NEGATIVE Sensitive     LINEZOLID 2 SENSITIVE Sensitive     * ABUNDANT METHICILLIN RESISTANT STAPHYLOCOCCUS AUREUS  Aerobic/Anaerobic Culture w Gram Stain (surgical/deep wound)     Status: None (Preliminary result)   Collection Time: 12/09/23 10:23 AM   Specimen: Wound  Result Value Ref Range Status   Specimen Description WOUND  Final   Special Requests DISTAL PORTION OF GRAFT FROM RIGHT GROIN  Final   Gram Stain   Final    RARE WBC PRESENT, PREDOMINANTLY MONONUCLEAR NO ORGANISMS SEEN Performed at [facility], [address], [place], [place] [address]    Culture   Final    FEW STAPHYLOCOCCUS AUREUS SUSCEPTIBILITIES PERFORMED ON PREVIOUS CULTURE WITHIN THE LAST 5 DAYS. NO ANAEROBES ISOLATED; CULTURE IN PROGRESS FOR 5 DAYS    Report Status PENDING  Incomplete    Impression/Plan:  1.  Right axillofemoral bypass graft infection, status postsurgical debridement and removal of graft.  Was noted with MRSA and has been on daptomycin and no issues.  Will plan to have her continue with daptomycin after discharge due to the bacteremia as well.  Will plan for 3 weeks from the negative blood culture to assure clearance of the infection.  Will have her follow-up at that time as well to see if she needs any continuation oral therapy.  2.  MRSA bacteremia.  As above we will continue her on daptomycin.  Repeat blood cultures have remained negative.  TEE negative for vegetation.  She is tolerating daptomycin.  3.  Access.  Okay from an infectious disease standpoint to place a PICC line.  Discussed with Dr. Marland Mcalpine  OPAT:  Diagnosis: Graft infection with MRSA bacteremia  Culture Result: RSA  Allergies  Allergen Reactions   Wound Dressing Adhesive Rash and Other (See Comments)    Pt states that tape and  electrodes leave red "scars" on her skin and her skin is very sensitive.  Paper tape ok to use    OPAT Orders Discharge antibiotics to be given via PICC line Discharge antibiotics: Daptomycin IV 500 mg every 24 hours Per pharmacy protocol yes Duration: 3 weeks End Date: February 9  [facility] Care Per Protocol: Yes  Home health RN for IV administration and teaching; PICC line care and labs.    Labs weekly while on IV antibiotics: _x_ CBC with differential __ BMP _x_ CMP __ CRP __ ESR __ Vancomycin trough _x_ CK  _x_ Please pull PIC at completion of IV antibiotics __ Please leave PIC in place until doctor has seen patient or been notified  Fax weekly labs to [phone]  Clinic Follow Up Appt: Feb 10 with Dr. Luciana Axe  @ 3:45

## 2023-12-13 NOTE — H&P (View-Only) (Signed)
   [facility] has been requested to perform a transesophageal echocardiogram on Jill Higgins for further evaluation of bacteremia.    The patient does NOT have any absolute or relative contraindications to a Transesophageal Echocardiogram (TEE).  The patient has: No other conditions that may impact this procedure. She does have a history of sleep apnea listed in her test but under the comment section it says "recent test negative for sleep apnea."  After careful review of history and examination, the risks and benefits of transesophageal echocardiogram have been explained including risks of esophageal damage, perforation (1:10,000 risk), bleeding, pharyngeal hematoma as well as other potential complications associated with conscious sedation including aspiration, arrhythmia, respiratory failure and death. Alternatives to treatment were discussed, questions were answered. Patient is willing to proceed.   Signed, Corrin Parker, PA-C  12/13/2023 8:20 AM

## 2023-12-13 NOTE — Progress Notes (Signed)
Occupational Therapy Treatment Patient Details Name: Jill Higgins MRN: [id number] DOB: [birth year] Today's Date: 12/13/2023   History of present illness The pt is a 78 yo female presenting 1/17 with drainage from R groin incision (recent R axillary - femoral bypass on 12/26). Now s/p redo R axilla - femoral artery bypass with wound vac placement 1/19. PMH includes: ight common femoral endarterectomy with right femoral to above-knee popliteal bypass, stenting of the right iliac and common femoral arteries, bronchitis, bulging disc, depression w/anxiety, DM II, fibromyalgia, GERD, glaucoma, hyperlipidemia, HTN, PVD, and sleep apnea.   OT comments  Pt with slower progress towards goals due to reported fatigue. Pt also with flat affect, slow initiation and requires constant cues for sequencing basic ADLs during session. Pt also noted with BM that she reportedly was aware of but did not express this to OT until visibly seen. Pt requires up to Mod A for UB bathing/dressing and Max A for LB bathing/dressing today. Pt able to stand with Min A using RW; opted for EOB ADLs rather than walking to sink when provided options. Based on current presentation and decreased support at DC, recommend continued inpatient follow up therapy, <3 hours/day at DC.      If plan is discharge home, recommend the following:  A little help with bathing/dressing/bathroom;A little help with walking and/or transfers;Assistance with cooking/housework;Assist for transportation;Help with stairs or ramp for entrance   Equipment Recommendations  Other (comment);BSC/3in1 (RW)    Recommendations for Other Services      Precautions / Restrictions Precautions Precautions: Fall Precaution Comments: wound vac R groin Restrictions Weight Bearing Restrictions Per Provider Order: No       Mobility Bed Mobility Overal bed mobility: Needs Assistance Bed Mobility: Supine to Sit, Sit to Supine     Supine to sit: HOB elevated, Min  assist Sit to supine: Min assist        Transfers Overall transfer level: Needs assistance Equipment used: Rolling walker (2 wheels) Transfers: Sit to/from Stand Sit to Stand: Min assist           General transfer comment: Min A from bedside with RW. able to stand at least 5 min for peri care assist. pt opted for ADLs sitting EOB rather than walking to sink     Balance Overall balance assessment: Needs assistance Sitting-balance support: Feet supported Sitting balance-Leahy Scale: Good     Standing balance support: Bilateral upper extremity supported Standing balance-Leahy Scale: Poor                             ADL either performed or assessed with clinical judgement   ADL Overall ADL's : Needs assistance/impaired     Grooming: Supervision/safety;Sitting;Wash/dry face   Upper Body Bathing: Minimal assistance;Sitting Upper Body Bathing Details (indicate cue type and reason): assistance to bathe back, max cues for sequencing/initiating Lower Body Bathing: Moderate assistance;Sitting/lateral leans;Sit to/from stand Lower Body Bathing Details (indicate cue type and reason): able to bathe lower LE sitting EOB w/ increased time. upon standing, pt noted with BM in mesh underwear w/ soiled purewick. extended time and assist needed from OT for cleanup. pt reported she knew BM was there but had not told this OT Upper Body Dressing : Moderate assistance;Sitting   Lower Body Dressing: Maximal assistance;Sitting/lateral leans;Sit to/from stand Lower Body Dressing Details (indicate cue type and reason): to doff soiled underwear  Extremity/Trunk Assessment Upper Extremity Assessment Upper Extremity Assessment: Generalized weakness;Right hand dominant;RUE deficits/detail RUE Deficits / Details: RUE noted to be more swollen than LUE   Lower Extremity Assessment Lower Extremity Assessment: Defer to PT evaluation        Vision   Vision  Assessment?: No apparent visual deficits   Perception     Praxis      Cognition Arousal: Alert Behavior During Therapy: Flat affect Overall Cognitive Status: Impaired/Different from baseline Area of Impairment: Attention, Following commands, Safety/judgement, Awareness, Problem solving                   Current Attention Level: Sustained   Following Commands: Follows one step commands with increased time Safety/Judgement: Decreased awareness of deficits Awareness: Intellectual, Emergent Problem Solving: Slow processing, Decreased initiation, Difficulty sequencing, Requires verbal cues, Requires tactile cues General Comments: flat affect, kept eyes closed for much of session but only endorsed feeling "tired". pt with very slow processing and initiation and max multimodal step by step cues needed to complete ADLs. pt also noted with BM well into OT session - did not verbalize this until OT noted it and asked about it. Pt reported she called out overnight about it but no one came - pt appeared to not have notified oncoming shift either - NT aware        Exercises      Shoulder Instructions       General Comments      Pertinent Vitals/ Pain       Pain Assessment Pain Assessment: Faces Faces Pain Scale: Hurts a little bit Pain Location: R groin Pain Descriptors / Indicators: Discomfort, Grimacing Pain Intervention(s): Monitored during session, Limited activity within patient's tolerance  Home Living                                          Prior Functioning/Environment              Frequency  Min 1X/week        Progress Toward Goals  OT Goals(current goals can now be found in the care plan section)  Progress towards OT goals: OT to reassess next treatment  Acute Rehab OT Goals Patient Stated Goal: have warm blankets OT Goal Formulation: With patient Time For Goal Achievement: 12/24/23 Potential to Achieve Goals: Good ADL Goals Pt  Will Perform Grooming: with supervision;with set-up;sitting Pt Will Perform Upper Body Bathing: with supervision;with set-up;sitting Pt Will Perform Lower Body Bathing: with min assist;with contact guard assist;sit to/from stand Pt Will Perform Upper Body Dressing: with supervision;with set-up;sitting Pt Will Perform Lower Body Dressing: with min assist;with contact guard assist;sit to/from stand Pt Will Transfer to Toilet: with min assist;with contact guard assist;ambulating Pt Will Perform Toileting - Clothing Manipulation and hygiene: with contact guard assist;with supervision;sit to/from stand  Plan      Co-evaluation                 AM-PAC OT "6 Clicks" Daily Activity     Outcome Measure   Help from another person eating meals?: None Help from another person taking care of personal grooming?: A Little Help from another person toileting, which includes using toliet, bedpan, or urinal?: A Little Help from another person bathing (including washing, rinsing, drying)?: A Lot Help from another person to put on and taking off regular upper body clothing?: A Lot Help from  another person to put on and taking off regular lower body clothing?: A Lot 6 Click Score: 16    End of Session    OT Visit Diagnosis: Pain;Unsteadiness on feet (R26.81);Other abnormalities of gait and mobility (R26.89);Muscle weakness (generalized) (M62.81) Pain - Right/Left: Right Pain - part of body: Leg   Activity Tolerance Patient limited by fatigue   Patient Left in bed;with call bell/phone within reach;with bed alarm set   Nurse Communication Mobility status;Other (comment) (discussed with NT pt's continence/bathing)        Time: 4098-1191 OT Time Calculation (min): 36 min  Charges: OT General Charges $OT Visit: 1 Visit OT Treatments $Self Care/Home Management : 23-37 mins  Bradd Canary, OTR/L Acute Rehab Services Office: [phone]   Lorre Munroe 12/13/2023, 10:09 AM

## 2023-12-13 NOTE — Anesthesia Postprocedure Evaluation (Signed)
Anesthesia Post Note  Patient: Jill Higgins  Procedure(s) Performed: TRANSESOPHAGEAL ECHOCARDIOGRAM     Patient location during evaluation: PACU Anesthesia Type: MAC Level of consciousness: awake and alert and oriented Pain management: pain level controlled Vital Signs Assessment: post-procedure vital signs reviewed and stable Respiratory status: spontaneous breathing, nonlabored ventilation and respiratory function stable Cardiovascular status: stable and blood pressure returned to baseline Postop Assessment: no apparent nausea or vomiting Anesthetic complications: no   No notable events documented.  Last Vitals:  Vitals:   12/13/23 1111 12/13/23 1400  BP: (!) 146/64 106/70  Pulse: 67 82  Resp: 20 20  Temp: 36.7 C   SpO2: 98% 97%    Last Pain:  Vitals:   12/13/23 1111  TempSrc: Oral  PainSc:                  Teddy Pena A.

## 2023-12-13 NOTE — Anesthesia Preprocedure Evaluation (Addendum)
Anesthesia Evaluation  Patient identified by MRN, date of birth, ID band Patient awake    Reviewed: Allergy & Precautions, NPO status , Patient's Chart, lab work & pertinent test results  Airway Mallampati: II  TM Distance: >3 FB     Dental  (+) Dental Advisory Given, Missing, Chipped,    Pulmonary sleep apnea and Continuous Positive Airway Pressure Ventilation , pneumonia, resolved, Patient abstained from smoking., former smoker   Pulmonary exam normal breath sounds clear to auscultation       Cardiovascular hypertension, Pt. on medications + Peripheral Vascular Disease  Normal cardiovascular exam Rhythm:Regular Rate:Normal     Neuro/Psych  PSYCHIATRIC DISORDERS Anxiety Depression     Neuromuscular disease    GI/Hepatic Neg liver ROS,GERD  Medicated,,  Endo/Other  diabetes, Well Controlled    Renal/GU negative Renal ROS  negative genitourinary   Musculoskeletal  (+) Arthritis , Osteoarthritis,  Fibromyalgia -  Abdominal   Peds  Hematology  (+) Blood dyscrasia, anemia Bacteremia   Anesthesia Other Findings   Reproductive/Obstetrics                             Anesthesia Physical Anesthesia Plan  ASA: 3  Anesthesia Plan: MAC   Post-op Pain Management: Minimal or no pain anticipated   Induction: Intravenous  PONV Risk Score and Plan: 2 and Treatment may vary due to age or medical condition and Propofol infusion  Airway Management Planned: Natural Airway and Nasal Cannula  Additional Equipment: None  Intra-op Plan:   Post-operative Plan: Extubation in OR  Informed Consent: I have reviewed the patients History and Physical, chart, labs and discussed the procedure including the risks, benefits and alternatives for the proposed anesthesia with the patient or authorized representative who has indicated his/her understanding and acceptance.     Dental advisory given  Plan  Discussed with: Anesthesiologist  Anesthesia Plan Comments:         Anesthesia Quick Evaluation

## 2023-12-13 NOTE — Progress Notes (Signed)
   [facility] has been requested to perform a transesophageal echocardiogram on Jill Higgins for further evaluation of bacteremia.    The patient does NOT have any absolute or relative contraindications to a Transesophageal Echocardiogram (TEE).  The patient has: No other conditions that may impact this procedure. She does have a history of sleep apnea listed in her test but under the comment section it says "recent test negative for sleep apnea."  After careful review of history and examination, the risks and benefits of transesophageal echocardiogram have been explained including risks of esophageal damage, perforation (1:10,000 risk), bleeding, pharyngeal hematoma as well as other potential complications associated with conscious sedation including aspiration, arrhythmia, respiratory failure and death. Alternatives to treatment were discussed, questions were answered. Patient is willing to proceed.   Signed, Corrin Parker, PA-C  12/13/2023 8:20 AM

## 2023-12-13 NOTE — Transfer of Care (Signed)
Immediate Anesthesia Transfer of Care Note  Patient: Jill Higgins  Procedure(s) Performed: TRANSESOPHAGEAL ECHOCARDIOGRAM  Patient Location: PACU  Anesthesia Type:MAC  Level of Consciousness: awake and alert   Airway & Oxygen Therapy: Patient Spontanous Breathing and Patient connected to nasal cannula oxygen  Post-op Assessment: Report given to RN and Post -op Vital signs reviewed and stable  Post vital signs: Reviewed and stable  Last Vitals:  Vitals Value Taken Time  BP 106/70 12/13/23 1400  Temp    Pulse 82 12/13/23 1400  Resp 20 12/13/23 1400  SpO2 97 % 12/13/23 1400    Last Pain:  Vitals:   12/13/23 1111  TempSrc: Oral  PainSc:       Patients Stated Pain Goal: 0 (12/11/23 2115)  Complications: No notable events documented.

## 2023-12-13 NOTE — Interval H&P Note (Signed)
History and Physical Interval Note:  12/13/2023 1:43 PM  Jill Higgins  has presented today for surgery, with the diagnosis of bacteremia.  The various methods of treatment have been discussed with the patient and family. After consideration of risks, benefits and other options for treatment, the patient has consented to  Procedure(s): TRANSESOPHAGEAL ECHOCARDIOGRAM (N/A) as a surgical intervention.  The patient's history has been reviewed, patient examined, no change in status, stable for surgery.  I have reviewed the patient's chart and labs.  Questions were answered to the patient's satisfaction.     Chilton Si, MD

## 2023-12-13 NOTE — Progress Notes (Addendum)
  Progress Note    12/13/2023 10:02 AM 4 Days Post-Op  Subjective:  no complaints   Vitals:   12/13/23 0321 12/13/23 0729  BP: (!) 150/76 (!) 145/68  Pulse: 72 66  Resp: 17 16  Temp: 98.6 F (37 C) 98.5 F (36.9 C)  SpO2: 96% 97%   Physical Exam: Lungs:  non labored Incisions:  R chest c/d/I; R groin with vac, good seal Extremities:  brisk R DP by doppler; palpable L DP; palpable R radial pulse; R arm edematous Neurologic: A&O  CBC    Component Value Date/Time   WBC 9.4 12/12/2023 0324   RBC 2.59 (L) 12/12/2023 0324   HGB 8.2 (L) 12/12/2023 0324   HCT 24.4 (L) 12/12/2023 0324   PLT 148 (L) 12/12/2023 0324   MCV 94.2 12/12/2023 0324   MCH 31.7 12/12/2023 0324   MCHC 33.6 12/12/2023 0324   RDW 14.2 12/12/2023 0324   LYMPHSABS 1.6 12/07/2023 2024   MONOABS 0.8 12/07/2023 2024   EOSABS 0.0 12/07/2023 2024   BASOSABS 0.1 12/07/2023 2024    BMET    Component Value Date/Time   NA 135 12/12/2023 0324   K 3.5 12/12/2023 0324   CL 107 12/12/2023 0324   CO2 23 12/12/2023 0324   GLUCOSE 105 (H) 12/12/2023 0324   BUN 9 12/12/2023 0324   CREATININE 0.55 12/12/2023 0324   CALCIUM 7.2 (L) 12/12/2023 0324   GFRNONAA >60 12/12/2023 0324   GFRAA >60 09/04/2019 0249    INR    Component Value Date/Time   INR 1.3 (H) 12/07/2023 2024     Intake/Output Summary (Last 24 hours) at 12/13/2023 1002 Last data filed at 12/13/2023 0727 Gross per 24 hour  Intake --  Output 1150 ml  Net -1150 ml     Assessment/Plan:  78 y.o. female is s/p R ax-fem bypass excision 4 Days Post-Op   -Right foot warm and well-perfused with brisk DP by Doppler; left DP palpable -Right hand well-perfused with palpable radial pulse.  She does have an edematous right hand and forearm presumably related to IV infiltration.  If her arm continues to be edematous and bothersome tomorrow, consider checking right upper extremity venous duplex to rule out DVT -Right chest incision appears to be healing  well; right groin wound VAC with good seal.  Right groin VAC to be changed by [facility] RN tomorrow and to continue twice weekly -Out of bed with therapy teams again today -Antibiotic regimen per ID; plans noted for transesophageal echocardiogram    Emilie Rutter, PA-C [facility] [phone] 12/13/2023 10:02 AM  I have seen and evaluated the patient. I agree with the PA note as documented above.  Postop day 4 status post excision of infected right axillofemoral bypass graft with vein patch of the axillary artery and fem distal bypass in the right groin.  Right chest wall incision looks good.  Drain was removed yesterday.  Palpable radial pulse.  Right groin VAC was changed yesterday.  Monophasic signal in the right foot no significant symptoms at this time.  She had ID for MRSA.  Cephus Shelling, MD [facility] [place] Office: [phone]

## 2023-12-14 ENCOUNTER — Inpatient Hospital Stay (HOSPITAL_COMMUNITY): Payer: Medicare Other

## 2023-12-14 ENCOUNTER — Encounter (HOSPITAL_COMMUNITY): Payer: Self-pay | Admitting: Cardiovascular Disease

## 2023-12-14 DIAGNOSIS — K861 Other chronic pancreatitis: Secondary | ICD-10-CM | POA: Diagnosis not present

## 2023-12-14 DIAGNOSIS — M7989 Other specified soft tissue disorders: Secondary | ICD-10-CM

## 2023-12-14 DIAGNOSIS — F418 Other specified anxiety disorders: Secondary | ICD-10-CM | POA: Diagnosis not present

## 2023-12-14 DIAGNOSIS — R7881 Bacteremia: Secondary | ICD-10-CM | POA: Diagnosis not present

## 2023-12-14 DIAGNOSIS — T827XXA Infection and inflammatory reaction due to other cardiac and vascular devices, implants and grafts, initial encounter: Secondary | ICD-10-CM | POA: Diagnosis not present

## 2023-12-14 HISTORY — PX: IR FLUORO GUIDE CV LINE RIGHT: IMG2283

## 2023-12-14 HISTORY — PX: IR US GUIDE VASC ACCESS RIGHT: IMG2390

## 2023-12-14 LAB — PHOSPHORUS: Phosphorus: 3.5 mg/dL (ref 2.5–4.6)

## 2023-12-14 LAB — COMPREHENSIVE METABOLIC PANEL
ALT: 17 U/L (ref 0–44)
AST: 18 U/L (ref 15–41)
Albumin: 1.9 g/dL — ABNORMAL LOW (ref 3.5–5.0)
Alkaline Phosphatase: 73 U/L (ref 38–126)
Anion gap: 8 (ref 5–15)
BUN: 8 mg/dL (ref 8–23)
CO2: 24 mmol/L (ref 22–32)
Calcium: 8.1 mg/dL — ABNORMAL LOW (ref 8.9–10.3)
Chloride: 106 mmol/L (ref 98–111)
Creatinine, Ser: 0.52 mg/dL (ref 0.44–1.00)
GFR, Estimated: >60 mL/min (ref >60)
Glucose, Bld: 126 mg/dL — ABNORMAL HIGH (ref 70–99)
Potassium: 3.8 mmol/L (ref 3.5–5.1)
Sodium: 138 mmol/L (ref 135–145)
Total Bilirubin: 0.4 mg/dL (ref 0.0–1.2)
Total Protein: 5 g/dL — ABNORMAL LOW (ref 6.5–8.1)

## 2023-12-14 LAB — CBC WITH DIFFERENTIAL/PLATELET
Abs Immature Granulocytes: 0.1 10*3/uL — ABNORMAL HIGH (ref 0.00–0.07)
Basophils Absolute: 0 10*3/uL (ref 0.0–0.1)
Basophils Relative: 0 %
Eosinophils Absolute: 0.4 10*3/uL (ref 0.0–0.5)
Eosinophils Relative: 4 %
HCT: 29.3 % — ABNORMAL LOW (ref 36.0–46.0)
Hemoglobin: 9.8 g/dL — ABNORMAL LOW (ref 12.0–15.0)
Immature Granulocytes: 1 %
Lymphocytes Relative: 31 %
Lymphs Abs: 2.8 10*3/uL (ref 0.7–4.0)
MCH: 31.6 pg (ref 26.0–34.0)
MCHC: 33.4 g/dL (ref 30.0–36.0)
MCV: 94.5 fL (ref 80.0–100.0)
Monocytes Absolute: 0.5 10*3/uL (ref 0.1–1.0)
Monocytes Relative: 6 %
Neutro Abs: 5.3 10*3/uL (ref 1.7–7.7)
Neutrophils Relative %: 58 %
Platelets: 301 10*3/uL (ref 150–400)
RBC: 3.1 MIL/uL — ABNORMAL LOW (ref 3.87–5.11)
RDW: 13.9 % (ref 11.5–15.5)
WBC: 9.1 10*3/uL (ref 4.0–10.5)
nRBC: 0 % (ref 0.0–0.2)

## 2023-12-14 LAB — GLUCOSE, CAPILLARY
Glucose-Capillary: 115 mg/dL — ABNORMAL HIGH (ref 70–99)
Glucose-Capillary: 185 mg/dL — ABNORMAL HIGH (ref 70–99)
Glucose-Capillary: 194 mg/dL — ABNORMAL HIGH (ref 70–99)
Glucose-Capillary: 148 mg/dL — ABNORMAL HIGH (ref 70–99)

## 2023-12-14 LAB — AEROBIC/ANAEROBIC CULTURE W GRAM STAIN (SURGICAL/DEEP WOUND)

## 2023-12-14 LAB — CULTURE, BLOOD (ROUTINE X 2)
Culture: NO GROWTH
Culture: NO GROWTH

## 2023-12-14 LAB — MAGNESIUM: Magnesium: 1.8 mg/dL (ref 1.7–2.4)

## 2023-12-14 MED ORDER — MAGNESIUM SULFATE 2 GM/50ML IV SOLN: 2.0000 g | Freq: Once | INTRAVENOUS | Status: DC

## 2023-12-14 MED ORDER — LIDOCAINE-EPINEPHRINE 1 %-1:100000 IJ SOLN
20.0000 mL | Freq: Once | INTRAMUSCULAR | Status: AC
  Administered 2023-12-14: 10 mL via INTRADERMAL

## 2023-12-14 MED ORDER — LIDOCAINE-EPINEPHRINE 1 %-1:100000 IJ SOLN
INTRAMUSCULAR | Status: AC
  Filled 2023-12-14: qty 1

## 2023-12-14 NOTE — TOC Progression Note (Addendum)
Transition of Care (TOC) - Progression Note  Donn Pierini RN, BSN Transitions of Care Unit 4E- RN Case Manager See Treatment Team for direct phone #   Patient Details  Name: Jill Higgins MRN: [id number] Date of Birth: [birth year]  Transition of Care [facility]) CM/SW Contact  Zenda Alpers, Lenn Sink, RN Phone Number: 12/14/2023, 2:15 PM  Clinical Narrative:    CM spoke with pt at bedside to discuss transition needs and recommendations. Note pt will need home VAC and LT IV abx- Tunneled cath placed this am per IR.   Home VAC has been approved and is on hold for delivery pending plans for dispo SNF vs HH.   Per pt she lives alone- has neighbors that she voices can assist her, her daughter she states works. Explained to pt that she would need a "teachable" caregiver for her home IV abx needs, and HH - Pt voices she is unsure how much assistance her neighbors can give- but voices she can check. Asked if this writer can reach out to her daughter which pt gave permission for.   Further discussed recommendations for ST-SNF for rehab- pt states she would like to stay "here" for rehab- explained that being in the acute hospital is not rehab- discussed faxing info out to do bed search for SNF rehab options - pt gave permission to look into SNF and do bed search although still voicing that she prefers to go home and neighbors will help.   CSW also spoke pt and reached out to daughter - see CSW note.   Plan at this time is for SNF bed search, CM has updated  Pam with home IV infusion team and [facility] liaison. As well as [facility]M liaison w/ home [facility]   Expected Discharge Plan: Skilled Nursing Facility Barriers to Discharge: Continued Medical Work up, SNF Pending bed offer, English as a second language teacher  Expected Discharge Plan and Services In-house Referral: Clinical Social Work Discharge Planning Services: CM Consult   Living arrangements for the past 2 months: Apartment                 DME Arranged:  Vac DME Agency: [employer] Date DME Agency Contacted: 12/12/23 Time DME Agency Contacted: [phone] Representative spoke with at DME Agency: [facility] HH Arranged: RN, PT, OT [facility] Agency: [facility] Date HH Agency Contacted: 12/12/23 Time HH Agency Contacted: [phone] Representative spoke with at [facility] Agency: Aggie Cosier   Social Determinants of Health (SDOH) Interventions SDOH Screenings   Food Insecurity: No Food Insecurity (12/11/2023)  Housing: Low Risk  (12/11/2023)  Transportation Needs: No Transportation Needs (12/11/2023)  Utilities: Not At Risk (12/11/2023)  Depression (PHQ2-9): Low Risk  (11/11/2020)  Social Connections: Moderately Isolated (12/11/2023)  Tobacco Use: Medium Risk (12/13/2023)    Readmission Risk Interventions    11/20/2023    3:47 PM  Readmission Risk Prevention Plan  Post Dischage Appt Complete  Medication Screening Complete  Transportation Screening Complete

## 2023-12-14 NOTE — TOC Transition Note (Signed)
Transition of Care [facility]) - Discharge Note   Patient Details  Name: Jill Higgins MRN: [id number] Date of Birth: 01-Jun-1946  Transition of Care [facility]) CM/SW Contact:  Eduard Roux, LCSW Phone Number: 12/14/2023, 1:59 PM   Clinical Narrative:     10:10am- CSW met with patient. CSW introduced self and explained role. CSW discussed with patient recommendation of short term rehab at [facility].  Patient states she lives home alone. Patient states she wants to " stay here at the hospital". CSW explained recommendations is for skilled. She states her preference is discharge home. She states 2 neighbors are retired and can help her at home. Patient wants to go home but gave CSW permission to send out SNF referrals. CSW also given permission to call her daughter, [name].   1:31pm- CSW spoke w/ Vickie by phone. She confirmed her mother lives home alone. She states she works and she has two a story home- unable to provide the care patient needs at this time. She states patient does not have neighbors that can assist her. Vickie expresses she is uncomfortable with patient returning home and wants SNF placement. CSW encourage Vickie to talk w/patient and express her concerns. Patient will need to agree to rehab at [facility]. She states understanding.  TOC will provide bed offers once received  TOC will continue to follow and assist with discharge planning.  Antony Blackbird, MSW, LCSW Clinical Social Worker    Final next level of care: Skilled Nursing Facility Barriers to Discharge: Continued Medical Work up, SNF Pending bed offer, Insurance Authorization   Patient Goals and CMS Choice Patient states their goals for this hospitalization and ongoing recovery are:: to get stronger and heal CMS Medicare.gov Compare Post Acute Care list provided to:: Patient Choice offered to / list presented to : Patient      Discharge Placement                       Discharge Plan and Services Additional  resources added to the After Visit Summary for   In-house Referral: Clinical Social Work Discharge Planning Services: CM Consult            DME Arranged: [place] DME Agency: [employer] Date DME Agency Contacted: 12/12/23 Time DME Agency Contacted: 1406 Representative spoke with at DME Agency: French [facility] HH Arranged: RN, PT, OT [facility] Agency: [facility] Date HH Agency Contacted: 12/12/23 Time HH Agency Contacted: 1406 Representative spoke with at [facility] Agency: Aggie Cosier  Social Drivers of Health (SDOH) Interventions SDOH Screenings   Food Insecurity: No Food Insecurity (12/11/2023)  Housing: Low Risk  (12/11/2023)  Transportation Needs: No Transportation Needs (12/11/2023)  Utilities: Not At Risk (12/11/2023)  Depression (PHQ2-9): Low Risk  (11/11/2020)  Social Connections: Moderately Isolated (12/11/2023)  Tobacco Use: Medium Risk (12/13/2023)     Readmission Risk Interventions    11/20/2023    3:47 PM  Readmission Risk Prevention Plan  Post Dischage Appt Complete  Medication Screening Complete  Transportation Screening Complete

## 2023-12-14 NOTE — [place] (Signed)
[place] MED[place]2 LEVEL OF CARE FORM     IDENTIFICATION  Patient Name: Jill Higgins Birthdate: April 27, 1946 Sex: female Admission Date (Current Location): 12/07/2023  [facility] and [place] Number:  Producer, television/film/video and Address:  [facility] [place] [address], [address], [place], [place] [address]      Provider Number: [id number]  Attending Physician Name and Address:  Merlene Laughter, DO  Relative Name and Phone Number:       Current Level of Care: Hospital Recommended Level of Care: Skilled Nursing Facility Prior Approval Number:    Date Approved/Denied:   PASRR Number: [id number]  Discharge Plan: SNF    Current Diagnoses: Patient Active Problem List   Diagnosis Date Noted   Bacteremia 12/13/2023   Protein-calorie malnutrition, severe 12/11/2023   Vascular graft infection, initial encounter (HCC) 12/07/2023   Sepsis (HCC) 12/07/2023   Peripheral arterial disease (HCC) 04/20/2023   Pancreatitis 01/25/2022   Chronic pancreatitis (HCC) 09/02/2019   Type 2 diabetes mellitus with vascular disease (HCC) 09/02/2019   Diabetes (HCC) 05/23/2018   Other spondylosis with radiculopathy, cervical region 03/05/2018   Foraminal stenosis of cervical region 09/11/2017   Partial tear of right rotator cuff 04/04/2017   Aftercare following surgery of the circulatory system 02/23/2016   S/P femoropopliteal bypass surgery 02/23/2016   PAD (peripheral artery disease) (HCC) 02/08/2016   Leg edema, left 12/18/2013   Aftercare following surgery of the circulatory system, NEC 11/19/2013   Ischemic leg 10/21/2013   Peripheral vascular disease, unspecified (HCC) 09/18/2013   Preop cardiovascular exam 09/08/2013   Hypertension    Hyperlipidemia    Peripheral vascular disease (HCC)    Fibromyalgia    GERD (gastroesophageal reflux disease)    Depression with anxiety    Pain in limb 04/03/2013   Atherosclerosis of native artery of extremity with intermittent  claudication (HCC) 08/01/2012   Claudication (HCC) 12/28/2011    Orientation RESPIRATION BLADDER Height & Weight     Self, Time, Situation  Normal Continent Weight: 110 lb 3.7 oz (50 kg) Height:  5' 2.99" (160 cm)  BEHAVIORAL SYMPTOMS/MOOD NEUROLOGICAL BOWEL NUTRITION STATUS      Continent Diet (please see discharge summary)  AMBULATORY STATUS COMMUNICATION OF NEEDS Skin   Limited Assist Verbally Surgical wounds (closed incision left arm, closed incision right groin,closed incision right chest, negative pressure wound therapy groin anterior proximal right)                       Personal Care Assistance Level of Assistance  Bathing, Feeding, Dressing Bathing Assistance: Limited assistance Feeding assistance: Independent Dressing Assistance: Limited assistance     Functional Limitations Info  Sight, Hearing, Speech Sight Info: Impaired (eyeglasses) Hearing Info: Adequate Speech Info: Adequate    SPECIAL CARE FACTORS FREQUENCY  PT (By licensed PT), OT (By licensed OT)     PT Frequency: 5x per week OT Frequency: 5x per week            Contractures Contractures Info: Not present    Additional Factors Info  Code Status, Allergies, Isolation Precautions Code Status Info: FULL Allergies Info: wound dressing adhesive     Isolation Precautions Info: MRSA     Current Medications (12/14/2023):  This is the current hospital active medication list Current Facility-Administered Medications  Medication Dose Route Frequency Provider Last Rate Last Admin   acetaminophen (TYLENOL) tablet 650 mg  650 mg Oral Q6H PRN Chilton Si, MD   650 mg  at 12/12/23 2104   Or   acetaminophen (TYLENOL) suppository 650 mg  650 mg Rectal Q6H PRN Chilton Si, MD       aspirin EC tablet 81 mg  81 mg Oral QHS Chilton Si, MD   81 mg at 12/13/23 2141   atorvastatin (LIPITOR) tablet 80 mg  80 mg Oral Daily Chilton Si, MD   80 mg at 12/14/23 1610   clopidogrel (PLAVIX)  tablet 75 mg  75 mg Oral Q breakfast Chilton Si, MD   75 mg at 12/14/23 0932   DAPTOmycin (CUBICIN) IVPB 500 mg/43mL premix  500 mg Intravenous Q1400 Chilton Si, MD 100 mL/hr at 12/14/23 1327 500 mg at 12/14/23 1327   docusate sodium (COLACE) capsule 100 mg  100 mg Oral Daily Chilton Si, MD   100 mg at 12/12/23 1048   DULoxetine (CYMBALTA) DR capsule 60 mg  60 mg Oral Daily Chilton Si, MD   60 mg at 12/14/23 0932   heparin injection 5,000 Units  5,000 Units Subcutaneous Q8H Chilton Si, MD   5,000 Units at 12/14/23 1328   insulin aspart (novoLOG) injection 0-9 Units  0-9 Units Subcutaneous TID WC Chilton Si, MD   2 Units at 12/13/23 1139   insulin glargine-yfgn (SEMGLEE) injection 5 Units  5 Units Subcutaneous Daily Chilton Si, MD   5 Units at 12/14/23 0935   lactose free nutrition (BOOST PLUS) liquid 237 mL  237 mL Oral BID BM Chilton Si, MD   237 mL at 12/14/23 1328   magnesium sulfate IVPB 2 g 50 mL  2 g Intravenous Daily PRN Chilton Si, MD       multivitamin with minerals tablet 1 tablet  1 tablet Oral Daily Chilton Si, MD   1 tablet at 12/14/23 0932   oxyCODONE (Oxy IR/ROXICODONE) immediate release tablet 5-10 mg  5-10 mg Oral Q4H PRN Chilton Si, MD   5 mg at 12/14/23 0932   pantoprazole (PROTONIX) EC tablet 40 mg  40 mg Oral BID Chilton Si, MD   40 mg at 12/14/23 0932   phenol (CHLORASEPTIC) mouth spray 1 spray  1 spray Mouth/Throat PRN Chilton Si, MD       polyethylene glycol (MIRALAX / GLYCOLAX) packet 17 g  17 g Oral Daily PRN Chilton Si, MD       potassium chloride SA (KLOR-CON M) CR tablet 20-40 mEq  20-40 mEq Oral Daily PRN Chilton Si, MD       prochlorperazine (COMPAZINE) injection 5 mg  5 mg Intravenous Q4H PRN Chilton Si, MD       sodium chloride flush (NS) 0.9 % injection 3 mL  3 mL Intravenous Q12H Chilton Si, MD   3 mL at 12/13/23 1022   temazepam (RESTORIL) capsule  30 mg  30 mg Oral QHS PRN Chilton Si, MD   30 mg at 12/13/23 2141     Discharge Medications: Please see discharge summary for a list of discharge medications.  Relevant Imaging Results:  Relevant Lab Results:   Additional Information SSN [id number]  Eduard Roux, LCSW

## 2023-12-14 NOTE — Progress Notes (Signed)
PHARMACY CONSULT NOTE FOR:  OUTPATIENT  PARENTERAL ANTIBIOTIC THERAPY (OPAT)  Indication: MRSA bacteremia/graft infection Regimen: Daptomycin 500 mg IV Q 24 hours  End date: 12/30/2023  IV antibiotic discharge orders are pended. To discharging provider:  please sign these orders via discharge navigator,  Select New Orders & click on the button choice - Manage This Unsigned Work.     Thank you for allowing pharmacy to be a part of this patient's care.  Sharin Mons, PharmD, BCPS, BCIDP Infectious Diseases Clinical Pharmacist Phone: [phone] 12/14/2023, 8:16 AM

## 2023-12-14 NOTE — Progress Notes (Signed)
PROGRESS NOTE    Jill Higgins  XBM:[id number] DOB: [birth year] DOA: 12/07/2023 PCP: Merri Brunette, MD   Brief Narrative:  Jill Higgins is a 78 y.o. female with a history of hypertension, diabetes mellitus type 2, depression, anxiety, PAD with thrombosis of right extremity iliac/common femoral and disabling claudication status post axillofemoral bypass graft.  Patient presented secondary to discharge from groin incision with associated chills concerning for evidence of infection.  Patient met sepsis criteria on admission to start empiric antibiotics.  Blood cultures obtained prior to antibiotics.  Vascular surgery consulted with plan for surgical management.   **Interim History She is found to have MRSA bacteremia and ID has been consulted and has been changed to IV Dapto.  She underwent surgical intervention for her right axillofemoral bypass graft infection.  PT OT recommending SNF and was assisting with disposition now that PICC line is in and she appears medically stable.  Assessment and Plan:  Severe Sepsis -Present on admission with associated lactic acidosis.  -Secondary to graft infection.  -Blood cultures (1/17) obtained on admission and are significant for MRSA.  -Repeat CX from 1/19 showing NGTD at 4 Days -Empiric Vancomycin and Cefepime started and transitioned to Vancomycin monotherapy and now Daptomycin.  -ID consulted.   Right Axillo-Femoral Bypass Graft Infection -Present on admission. Surgery performed on 12/26.  -Vascular surgery consulted and are following.  -WBC Trend had improved but slightly trended up: Recent Labs  Lab 12/08/23 0529 12/09/23 0307 12/10/23 0831 12/11/23 0335 12/12/23 0324 12/13/23 1014 12/14/23 0929  WBC 17.4* 13.9* 11.7* 7.9 9.4 12.1* 9.1  -Cefepime discontinued. Patient underwent removal of bypass on 1/19; culture significant for GPCs -Vancomycin changed to IV Daptomycin by ID -Follow-up graft culture data and it shows:  ABUNDANT  METHICILLIN RESISTANT STAPHYLOCOCCUS AUREUS NO ANAEROBES ISOLATED; CULTURE IN PROGRESS FOR 5 DAYS   Report Status PENDING  Organism ID, Bacteria METHICILLIN RESISTANT STAPHYLOCOCCUS AUREUS  Resulting Agency [facility]     Susceptibility    Methicillin resistant staphylococcus aureus    MIC    CIPROFLOXACIN >=8 RESISTANT Resistant    CLINDAMYCIN <=0.25 SENS... Sensitive    ERYTHROMYCIN <=0.25 SENS... Sensitive    GENTAMICIN <=0.5 SENSI... Sensitive    Inducible Clindamycin NEGATIVE Sensitive    LINEZOLID 2 SENSITIVE Sensitive    OXACILLIN >=4 RESISTANT Resistant    RIFAMPIN <=0.5 SENSI... Sensitive    TETRACYCLINE <=1 SENSITIVE Sensitive    TRIMETH/SULFA <=10 SENSIT... Sensitive    VANCOMYCIN <=0.5 SENSI... Sensitive      -Right forearm and Hand edematous will check RUE Duplex to Evaluate for DVT -Right Groin VAC to be changed by [facility] RN tomorrow and twice weekly    MRSA Bacteremia -Source is likely right groin wound. ID consulted -Patient was managed on Vancomycin. Repeat blood cultures (1/19) obtained and are no growth to date. Transthoracic Echocardiogram significant for no evidence of valvular vegetations. -ID recommendations: Vancomycin now changed to Daptomycin on 12/11/23(plan for 4-6 weeks), -Cardiology consulted for Transesophageal Echocardiogram which was recommended by ID and this was done today and showed now LA/LAA Thrombus or masses with EF of 60-65% -Per ID if repeat blood cultures negative for 2 days and they remain negative that is okay from an infectious disease standpoint to place PICC line; Since NGTD at 4 Days ID states ok to Place PICC where she cannot get a PICC line so had to get a tunneled IJ PICC by interventional radiology  Right arm swelling -Upper extremity duplex done and showed  an SVT   PAD -S/p axillo-femoral bypass graft.  -Patient follows with vascular surgery as an outpatient.  -LDL is down to 15. -Continue Atorvastatin 80 mg po Daily,  Aspirin 81 mg po qHS and Clopidogrel 75 mg po Daily    Primary Hypertension -Patient is on Amlodipine, Losartan and Toprol Xl as an outpatient. -Antihypertensives held on admission secondary to hypotension. -Continue to Monitor BP per Protocol -Last BP reading was 138/75   Diabetes Mellitus, Type 2 -Uncontrolled with hyperglycemia.  -Hold home Metformin 500 mg po BID -Last hemoglobin A1C of 9.5%.  -Patient is managed on insulin glargine, insulin aspart, and metformin as an outpatient and started on SSI on admission. Blood sugar low-normal initially, now worsening. -Continue Sensitive Novolog SSI AC and  Semglee 5 units daily -CBG Trend: Recent Labs  Lab 12/13/23 0639 12/13/23 1112 12/13/23 1628 12/13/23 2120 12/14/23 0645 12/14/23 1322 12/14/23 1643  GLUCAP 149* 176* 104* 133* 115* 185* 194*    Depression and Anxiety -Continue Duloxetine 60 mg po Daily  Metabolic Acidosis -Mild and improved. CO2 is now 24, AG is 8 , Chloride Level is now 106 -Continue to Monitor and Trend and repeat CMP in the AM   Insomnia -Continue Temazepam 30 mg po qHSprn Sleep  Hypokalemia -Patient's K+ Level Trend: Recent Labs  Lab 12/09/23 0914 12/09/23 1045 12/10/23 0355 12/11/23 0335 12/12/23 0324 12/13/23 1014 12/14/23 0929  K 3.3* 3.9 3.7 3.2* 3.5 4.0 3.8  -Continue to Monitor and Replete as Necessary -Repeat CMP in the AM   Hypomagnesemia -Patient's Mag Level Trend: Recent Labs  Lab 12/08/23 0529 12/10/23 0355 12/12/23 0324 12/13/23 1014 12/14/23 0929  MG 1.0* 2.0 1.5* 1.6* 1.8  -Replete with IV Mag Sulfate 2 grams again -Continue to Monitor and Replete as Necessary -Repeat Mag in the AM   ABLA and Perioperative Blood loss superimposed on Normocytic Anemia -Secondary to vascular surgery. Patient with hemoglobin down to 7.1 g/dL requiring a transfusion of 2 units of PRBC. Posttransfusion hemoglobin of 10.0, down to 8.2 today  -Hgb/Hct Trend: Recent Labs  Lab  12/09/23 0914 12/09/23 1045 12/10/23 0831 12/11/23 0335 12/12/23 0324 12/13/23 1014 12/14/23 0929  HGB 9.2* 7.1* 10.0* 8.6* 8.2* 10.1* 9.8*  HCT 27.0* 21.0* 28.4* 24.6* 24.4* 30.3* 29.3*  MCV  --   --  91.6 93.2 94.2 95.6 94.5  -Continue to Monitor for S/Sx of Bleeding; No overt bleeding noted -Repeat CBC in the AM   Hypoalbuminemia -Patient's Albumin Trend: Recent Labs  Lab 12/07/23 2024 12/08/23 0529 12/09/23 0307 12/10/23 0355 12/11/23 0335 12/13/23 1014 12/14/23 0929  ALBUMIN 2.4* 1.7* 1.7* 1.8* 1.7* 1.8* 1.9*  -Continue to Monitor and Trend and repeat CMP in the AM    DVT prophylaxis: heparin injection 5,000 Units Start: 12/12/23 0800 SCD's Start: 12/09/23 1835 SCDs Start: 12/07/23 2343    Code Status: Full Code Family Communication: Discused with family friend at bedside  Disposition Plan:  Level of care: Progressive Status is: Inpatient Remains inpatient appropriate because: Needs SNF and insurance authorization   Consultants:  Vascular Surgery Infectious Diseases  Cardiology for TEE  Procedures:  As delineated as above   Procedure by Dr. Chestine Spore: 1.  Harvest of left arm basilic vein 2.  Redo exposure of right axillary artery 3.  Excision of right axillary artery to femoral artery bypass 4.  Vein patch angioplasty right axillary artery 5.  Vein patch angioplasty of right femoral artery distal bypass 6.  Vac right groin   Antimicrobials:  Anti-infectives (  From admission, onward)    Start     Dose/Rate Route Frequency Ordered Stop   12/11/23 1400  DAPTOmycin (CUBICIN) IVPB 500 mg/57mL premix        500 mg 100 mL/hr over 30 Minutes Intravenous Daily 12/11/23 0836     12/10/23 1400  DAPTOmycin (CUBICIN) 400 mg in sodium chloride 0.9 % IVPB  Status:  Discontinued        8 mg/kg  50 kg 116 mL/hr over 30 Minutes Intravenous Daily 12/10/23 1111 12/11/23 0836   12/09/23 2100  vancomycin (VANCOCIN) 750 mg in sodium chloride 0.9 % 250 mL IVPB  Status:   Discontinued        750 mg 265 mL/hr over 60 Minutes Intravenous Every 24 hours 12/09/23 1759 12/10/23 1111   12/09/23 0800  vancomycin (VANCOCIN) IVPB 1000 mg/200 mL premix  Status:  Discontinued        1,000 mg 200 mL/hr over 60 Minutes Intravenous  Once 12/09/23 0746 12/09/23 0807   12/09/23 0748  vancomycin (VANCOCIN) 1-5 GM/200ML-% IVPB       Note to Pharmacy: Aquilla Hacker M: cabinet override      12/09/23 0748 12/09/23 1959   12/08/23 2100  vancomycin (VANCOCIN) 750 mg in sodium chloride 0.9 % 250 mL IVPB  Status:  Discontinued        750 mg 250 mL/hr over 60 Minutes Intravenous Every 24 hours 12/07/23 2359 12/09/23 1759   12/07/23 2100  vancomycin (VANCOCIN) IVPB 1000 mg/200 mL premix        1,000 mg 200 mL/hr over 60 Minutes Intravenous  Once 12/07/23 2048 12/07/23 2313   12/07/23 2100  ceFEPIme (MAXIPIME) 2 g in sodium chloride 0.9 % 100 mL IVPB  Status:  Discontinued        2 g 200 mL/hr over 30 Minutes Intravenous Every 12 hours 12/07/23 2053 12/08/23 1519       Subjective: Examined at bedside and she is feeling better today.  Complaining little bit of pain after her IJ PICC was placed.  Not as fatigued.  No chest pain or shortness of breath.  Feels better today.  Objective: Vitals:   12/14/23 0403 12/14/23 0838 12/14/23 1324 12/14/23 1644  BP: 99/84 128/77 93/75 138/75  Pulse: 68 (!) 58 62 70  Resp: 15 12 13 17   Temp: 97.6 F (36.4 C) (!) 97.5 F (36.4 C) 98.2 F (36.8 C) 98.2 F (36.8 C)  TempSrc: Oral Oral Oral Oral  SpO2: 100% 100% 100% 100%  Weight:      Height:        Intake/Output Summary (Last 24 hours) at 12/14/2023 1801 Last data filed at 12/14/2023 0840 Gross per 24 hour  Intake 50 ml  Output 1300 ml  Net -1250 ml   Filed Weights   12/07/23 2006 12/09/23 0648  Weight: 50 kg 50 kg   Examination: Physical Exam:  Constitutional: Thin female is chronically ill-appearing in no acute distress and appears improved today compared to  yesterday Respiratory: Diminished to auscultation bilaterally, no wheezing, rales, rhonchi or crackles. Normal respiratory effort and patient is not tachypenic. No accessory muscle use.  Unlabored breathing Cardiovascular: RRR, no murmurs / rubs / gallops. S1 and S2 auscultated.  Right arm swelling Abdomen: Soft, non-tender, non-distended. Bowel sounds positive.  GU: Deferred. Musculoskeletal: No clubbing / cyanosis of digits/nails. No joint deformity upper and lower extremities.  Right IJ PICC noted Neurologic: CN 2-12 grossly intact with no focal deficits. Romberg sign and cerebellar reflexes not  assessed.  Psychiatric: She is awake and alert in no acute distress today  Data Reviewed: I have personally reviewed following labs and imaging studies  CBC: Recent Labs  Lab 12/07/23 2024 12/07/23 2030 12/10/23 0831 12/11/23 0335 12/12/23 0324 12/13/23 1014 12/14/23 0929  WBC 24.9*   < > 11.7* 7.9 9.4 12.1* 9.1  NEUTROABS 20.9*  --   --   --   --  8.8* 5.3  HGB 13.0   < > 10.0* 8.6* 8.2* 10.1* 9.8*  HCT 39.4   < > 28.4* 24.6* 24.4* 30.3* 29.3*  MCV 94.7   < > 91.6 93.2 94.2 95.6 94.5  PLT 281   < > 108* 115* 148* 227 301   < > = values in this interval not displayed.   Basic Metabolic Panel: Recent Labs  Lab 12/08/23 0529 12/09/23 0307 12/10/23 0355 12/11/23 0335 12/12/23 0324 12/13/23 1014 12/14/23 0929  NA 136   < > 137 141 135 136 138  K 3.2*   < > 3.7 3.2* 3.5 4.0 3.8  CL 107   < > 107 110 107 105 106  CO2 22   < > 24 21* 23 21* 24  GLUCOSE 118*   < > 117* 232* 105* 177* 126*  BUN 11   < > 7* 5* 9 13 8   CREATININE 0.93   < > 0.71 0.76 0.55 0.53 0.52  CALCIUM 7.1*   < > 7.4* 7.2* 7.2* 7.9* 8.1*  MG 1.0*  --  2.0  --  1.5* 1.6* 1.8  PHOS  --   --   --   --   --  3.3 3.5   < > = values in this interval not displayed.   GFR: Estimated Creatinine Clearance: 45.7 mL/min (by C-G formula based on SCr of 0.52 mg/dL). Liver Function Tests: Recent Labs  Lab 12/09/23 0307  12/10/23 0355 12/11/23 0335 12/13/23 1014 12/14/23 0929  AST 34 20 23 25 18   ALT 22 15 17 19 17   ALKPHOS 62 38 45 70 73  BILITOT 0.5 0.8 0.6 0.5 0.4  PROT 4.3* 3.7* 3.8* 4.6* 5.0*  ALBUMIN 1.7* 1.8* 1.7* 1.8* 1.9*   No results for input(s): "LIPASE", "AMYLASE" in the last 168 hours. No results for input(s): "AMMONIA" in the last 168 hours. Coagulation Profile: Recent Labs  Lab 12/07/23 2024  INR 1.3*   Cardiac Enzymes: Recent Labs  Lab 12/08/23 0529 12/11/23 0335 12/13/23 1014  CKTOTAL 270* 114 44   BNP (last 3 results) No results for input(s): "PROBNP" in the last 8760 hours. HbA1C: No results for input(s): "HGBA1C" in the last 72 hours. CBG: Recent Labs  Lab 12/13/23 1628 12/13/23 2120 12/14/23 0645 12/14/23 1322 12/14/23 1643  GLUCAP 104* 133* 115* 185* 194*   Lipid Profile: No results for input(s): "CHOL", "HDL", "LDLCALC", "TRIG", "CHOLHDL", "LDLDIRECT" in the last 72 hours. Thyroid Function Tests: No results for input(s): "TSH", "T4TOTAL", "FREET4", "T3FREE", "THYROIDAB" in the last 72 hours. Anemia Panel: No results for input(s): "VITAMINB12", "FOLATE", "FERRITIN", "TIBC", "IRON", "RETICCTPCT" in the last 72 hours. Sepsis Labs: Recent Labs  Lab 12/07/23 2036 12/08/23 0052 12/08/23 0529  LATICACIDVEN 5.7* 4.2* 1.6   Recent Results (from the past 240 hours)  MIC (1 Drug)-Blood Culture; 12/07/2023; BLOOD; Staph aureus; Daptomycin     Status: None   Collection Time: 12/07/23 11:55 AM   Specimen: BLOOD  Result Value Ref Range Status   Min Inhibitory Conc (1 Drug) Preliminary report  Final  Comment: (NOTE) Performed At: [facility] [address] [place], [place] [id number] Jolene Schimke MD OZ:[id number]    Source BLOOD  Final    Comment: Performed at [facility], [address], [place], [place] [address]  Culture, blood (Routine x 2)     Status: Abnormal (Preliminary result)   Collection Time: 12/07/23  8:17 PM   Specimen:  BLOOD  Result Value Ref Range Status   Specimen Description BLOOD LEFT ANTECUBITAL  Final   Special Requests   Final    BOTTLES DRAWN AEROBIC AND ANAEROBIC Blood Culture results may not be optimal due to an inadequate volume of blood received in culture bottles   Culture  Setup Time   Final    GRAM POSITIVE COCCI ANAEROBIC BOTTLE ONLY CRITICAL RESULT CALLED TO, READ BACK BY AND VERIFIED WITH: PHARMD JESSICA MILLEN [id number] AT 1411 BY EC    Culture (A)  Final    METHICILLIN RESISTANT STAPHYLOCOCCUS AUREUS CULTURE REINCUBATED FOR BETTER GROWTH Performed at [facility], [address] [place], [place] [address]    Report Status PENDING  Incomplete   Organism ID, Bacteria METHICILLIN RESISTANT STAPHYLOCOCCUS AUREUS  Final      Susceptibility   Methicillin resistant staphylococcus aureus - MIC*    CIPROFLOXACIN >=8 RESISTANT Resistant     ERYTHROMYCIN <=0.25 SENSITIVE Sensitive     GENTAMICIN <=0.5 SENSITIVE Sensitive     OXACILLIN >=4 RESISTANT Resistant     TETRACYCLINE <=1 SENSITIVE Sensitive     VANCOMYCIN <=0.5 SENSITIVE Sensitive     TRIMETH/SULFA <=10 SENSITIVE Sensitive     CLINDAMYCIN <=0.25 SENSITIVE Sensitive     RIFAMPIN <=0.5 SENSITIVE Sensitive     Inducible Clindamycin NEGATIVE Sensitive     LINEZOLID 2 SENSITIVE Sensitive     * METHICILLIN RESISTANT STAPHYLOCOCCUS AUREUS  Blood Culture ID Panel (Reflexed)     Status: Abnormal   Collection Time: 12/07/23  8:17 PM  Result Value Ref Range Status   Enterococcus faecalis NOT DETECTED NOT DETECTED Final   Enterococcus Faecium NOT DETECTED NOT DETECTED Final   Listeria monocytogenes NOT DETECTED NOT DETECTED Final   Staphylococcus species DETECTED (A) NOT DETECTED Final    Comment: CRITICAL RESULT CALLED TO, READ BACK BY AND VERIFIED WITH: PHARMD JESSICA MILLEN [id number] AT 1411 BY EC    Staphylococcus aureus (BCID) DETECTED (A) NOT DETECTED Final    Comment: Methicillin (oxacillin)-resistant Staphylococcus aureus  (MRSA). MRSA is predictably resistant to beta-lactam antibiotics (except ceftaroline). Preferred therapy is vancomycin unless clinically contraindicated. Patient requires contact precautions if  hospitalized. CRITICAL RESULT CALLED TO, READ BACK BY AND VERIFIED WITH: PHARMD JESSICA MILLEN [id number] AT 1411 BY EC    Staphylococcus epidermidis NOT DETECTED NOT DETECTED Final   Staphylococcus lugdunensis NOT DETECTED NOT DETECTED Final   Streptococcus species NOT DETECTED NOT DETECTED Final   Streptococcus agalactiae NOT DETECTED NOT DETECTED Final   Streptococcus pneumoniae NOT DETECTED NOT DETECTED Final   Streptococcus pyogenes NOT DETECTED NOT DETECTED Final   A.calcoaceticus-baumannii NOT DETECTED NOT DETECTED Final   Bacteroides fragilis NOT DETECTED NOT DETECTED Final   Enterobacterales NOT DETECTED NOT DETECTED Final   Enterobacter cloacae complex NOT DETECTED NOT DETECTED Final   Escherichia coli NOT DETECTED NOT DETECTED Final   Klebsiella aerogenes NOT DETECTED NOT DETECTED Final   Klebsiella oxytoca NOT DETECTED NOT DETECTED Final   Klebsiella pneumoniae NOT DETECTED NOT DETECTED Final   Proteus species NOT DETECTED NOT DETECTED Final   Salmonella species NOT DETECTED  NOT DETECTED Final   Serratia marcescens NOT DETECTED NOT DETECTED Final   Haemophilus influenzae NOT DETECTED NOT DETECTED Final   Neisseria meningitidis NOT DETECTED NOT DETECTED Final   Pseudomonas aeruginosa NOT DETECTED NOT DETECTED Final   Stenotrophomonas maltophilia NOT DETECTED NOT DETECTED Final   Candida albicans NOT DETECTED NOT DETECTED Final   Candida auris NOT DETECTED NOT DETECTED Final   Candida glabrata NOT DETECTED NOT DETECTED Final   Candida krusei NOT DETECTED NOT DETECTED Final   Candida parapsilosis NOT DETECTED NOT DETECTED Final   Candida tropicalis NOT DETECTED NOT DETECTED Final   Cryptococcus neoformans/gattii NOT DETECTED NOT DETECTED Final   Meth resistant mecA/C and MREJ DETECTED  (A) NOT DETECTED Final    Comment: CRITICAL RESULT CALLED TO, READ BACK BY AND VERIFIED WITH: PHARMD JESSICA MILLEN [id number] AT 1411 BY EC Performed at [facility], [address] [place], [place] [address]   Culture, blood (Routine x 2)     Status: None   Collection Time: 12/07/23  8:20 PM   Specimen: BLOOD RIGHT ARM  Result Value Ref Range Status   Specimen Description BLOOD RIGHT ARM  Final   Special Requests   Final    BOTTLES DRAWN AEROBIC AND ANAEROBIC Blood Culture results may not be optimal due to an inadequate volume of blood received in culture bottles   Culture   Final    NO GROWTH 5 DAYS Performed at [facility], [address], [place], [place] [address]    Report Status 12/12/2023 FINAL  Final  Surgical pcr screen     Status: Abnormal   Collection Time: 12/08/23  7:57 PM   Specimen: Nasal Mucosa; Nasal Swab  Result Value Ref Range Status   MRSA, PCR POSITIVE (A) NEGATIVE Final    Comment: RESULT CALLED TO, READ BACK BY AND VERIFIED WITH: T IRBY,RN@2258  12/08/23 MK    Staphylococcus aureus POSITIVE (A) NEGATIVE Final    Comment: (NOTE) The Xpert SA Assay (FDA approved for NASAL specimens in patients 56 years of age and older), is one component of a comprehensive surveillance program. It is not intended to diagnose infection nor to guide or monitor treatment. Performed at [facility], [address] [place], [place] [address]   Culture, blood (Routine X 2) w Reflex to ID Panel     Status: None   Collection Time: 12/09/23  3:07 AM   Specimen: BLOOD RIGHT ARM  Result Value Ref Range Status   Specimen Description BLOOD RIGHT ARM  Final   Special Requests   Final    BOTTLES DRAWN AEROBIC AND ANAEROBIC Blood Culture results may not be optimal due to an inadequate volume of blood received in culture bottles   Culture   Final    NO GROWTH 5 DAYS Performed at [facility], [address] [place], [place] [address]    Report Status  12/14/2023 FINAL  Final  Culture, blood (Routine X 2) w Reflex to ID Panel     Status: None   Collection Time: 12/09/23  3:07 AM   Specimen: BLOOD RIGHT HAND  Result Value Ref Range Status   Specimen Description BLOOD RIGHT HAND  Final   Special Requests   Final    BOTTLES DRAWN AEROBIC AND ANAEROBIC Blood Culture results may not be optimal due to an inadequate volume of blood received in culture bottles   Culture   Final    NO GROWTH 5 DAYS Performed at  [facility], [address] [place] [place], [place] [address]    Report Status 12/14/2023 FINAL  Final  Aerobic/Anaerobic Culture w Gram Stain (surgical/deep wound)     Status: None   Collection Time: 12/09/23 10:16 AM   Specimen: Wound  Result Value Ref Range Status   Specimen Description WOUND  Final   Special Requests RIGHT GROIN  Final   Gram Stain   Final    NO WBC SEEN RARE GRAM POSITIVE COCCI IN PAIRS IN SINGLES    Culture   Final    MODERATE STAPHYLOCOCCUS AUREUS SUSCEPTIBILITIES PERFORMED ON PREVIOUS CULTURE WITHIN THE LAST 5 DAYS. NO ANAEROBES ISOLATED Performed at [facility], [address], [place], [place] [address]    Report Status 12/13/2023 FINAL  Final  Aerobic/Anaerobic Culture w Gram Stain (surgical/deep wound)     Status: None   Collection Time: 12/09/23 10:18 AM   Specimen: Wound  Result Value Ref Range Status   Specimen Description WOUND  Final   Special Requests portion of graft from right groin  Final   Gram Stain   Final    MODERATE WBC PRESENT,BOTH PMN AND MONONUCLEAR FEW GRAM POSITIVE COCCI IN PAIRS    Culture   Final    ABUNDANT METHICILLIN RESISTANT STAPHYLOCOCCUS AUREUS NO ANAEROBES ISOLATED Performed at [facility], [address], [place], [place] [address]    Report Status 12/14/2023 FINAL  Final   Organism ID, Bacteria METHICILLIN RESISTANT STAPHYLOCOCCUS AUREUS  Final      Susceptibility   Methicillin resistant staphylococcus aureus - MIC*    CIPROFLOXACIN >=8  RESISTANT Resistant     ERYTHROMYCIN <=0.25 SENSITIVE Sensitive     GENTAMICIN <=0.5 SENSITIVE Sensitive     OXACILLIN >=4 RESISTANT Resistant     TETRACYCLINE <=1 SENSITIVE Sensitive     VANCOMYCIN <=0.5 SENSITIVE Sensitive     TRIMETH/SULFA <=10 SENSITIVE Sensitive     CLINDAMYCIN <=0.25 SENSITIVE Sensitive     RIFAMPIN <=0.5 SENSITIVE Sensitive     Inducible Clindamycin NEGATIVE Sensitive     LINEZOLID 2 SENSITIVE Sensitive     * ABUNDANT METHICILLIN RESISTANT STAPHYLOCOCCUS AUREUS  Aerobic/Anaerobic Culture w Gram Stain (surgical/deep wound)     Status: None   Collection Time: 12/09/23 10:23 AM   Specimen: Wound  Result Value Ref Range Status   Specimen Description WOUND  Final   Special Requests DISTAL PORTION OF GRAFT FROM RIGHT GROIN  Final   Gram Stain   Final    RARE WBC PRESENT, PREDOMINANTLY MONONUCLEAR NO ORGANISMS SEEN    Culture   Final    FEW STAPHYLOCOCCUS AUREUS SUSCEPTIBILITIES PERFORMED ON PREVIOUS CULTURE WITHIN THE LAST 5 DAYS. NO ANAEROBES ISOLATED Performed at [facility], [address], [place], [place] [address]    Report Status 12/14/2023 FINAL  Final    Radiology Studies: IR Fluoro Guide CV Line Right Result Date: 12/14/2023 INDICATION: In need of durable intravenous access for antibiotic administration. Chronic renal insufficiency. EXAM: ULTRASOUND AND FLUOROSCOPIC GUIDED PLACEMENT OF TUNNELED CENTRAL VENOUS CATHETER MEDICATIONS: None, the patient is currently admitted to the hospital receiving intravenous antibiotics. The antibiotic was given in an appropriate time interval prior to skin puncture. ANESTHESIA/SEDATION: None FLUOROSCOPY TIME:  18 seconds (1 mGy) COMPLICATIONS: None immediate. PROCEDURE: Informed written consent was obtained from the patient after a discussion of the risks, benefits, and alternatives to treatment. Questions regarding the procedure were encouraged and answered. The left neck and chest were prepped with chlorhexidine  in  a sterile fashion, and a sterile drape was applied covering the operative field. Maximum barrier sterile technique with sterile gowns and gloves were used for the procedure. A timeout was performed prior to the initiation of the procedure. After the overlying soft tissues were anesthetized, a small venotomy incision was created and a micropuncture kit was utilized to access the internal jugular vein. Real-time ultrasound guidance was utilized for vascular access including the acquisition of a permanent ultrasound image documenting patency of the accessed vessel. The microwire was utilized to measure appropriate catheter length. The micropuncture sheath was exchanged for a peel-away sheath over a guidewire. A 5 [place] dual lumen tunneled central venous catheter measuring 24 cm was tunneled in a retrograde fashion from the anterior chest wall to the venotomy incision. The catheter was then placed through the peel-away sheath with tip ultimately positioned at the superior caval-atrial junction. Final catheter positioning was confirmed and documented with a spot radiographic image. The catheter aspirates and flushes normally. The catheter was flushed with appropriate volume heparin dwells. The catheter exit site was secured with a 0-Prolene retention suture. The venotomy incision was closed with Dermabond and Steri-strips. Dressings were applied. The patient tolerated the procedure well without immediate post procedural complication. FINDINGS: After catheter placement, the tip lies within the superior cavoatrial junction. The catheter aspirates and flushes normally and is ready for immediate use. IMPRESSION: Successful placement of 24 cm dual lumen tunneled central venous catheter via the left internal jugular vein with tip terminating at the superior caval atrial junction. The catheter is ready for immediate use. Electronically Signed   By: Simonne Come M.D.   On: 12/14/2023 15:26   IR US Guide Vasc Access  Right Result Date: 12/14/2023 INDICATION: In need of durable intravenous access for antibiotic administration. Chronic renal insufficiency. EXAM: ULTRASOUND AND FLUOROSCOPIC GUIDED PLACEMENT OF TUNNELED CENTRAL VENOUS CATHETER MEDICATIONS: None, the patient is currently admitted to the hospital receiving intravenous antibiotics. The antibiotic was given in an appropriate time interval prior to skin puncture. ANESTHESIA/SEDATION: None FLUOROSCOPY TIME:  18 seconds (1 mGy) COMPLICATIONS: None immediate. PROCEDURE: Informed written consent was obtained from the patient after a discussion of the risks, benefits, and alternatives to treatment. Questions regarding the procedure were encouraged and answered. The left neck and chest were prepped with chlorhexidine in a sterile fashion, and a sterile drape was applied covering the operative field. Maximum barrier sterile technique with sterile gowns and gloves were used for the procedure. A timeout was performed prior to the initiation of the procedure. After the overlying soft tissues were anesthetized, a small venotomy incision was created and a micropuncture kit was utilized to access the internal jugular vein. Real-time ultrasound guidance was utilized for vascular access including the acquisition of a permanent ultrasound image documenting patency of the accessed vessel. The microwire was utilized to measure appropriate catheter length. The micropuncture sheath was exchanged for a peel-away sheath over a guidewire. A 5 [place] dual lumen tunneled central venous catheter measuring 24 cm was tunneled in a retrograde fashion from the anterior chest wall to the venotomy incision. The catheter was then placed through the peel-away sheath with tip ultimately positioned at the superior caval-atrial junction. Final catheter positioning was confirmed and documented with a spot radiographic image. The catheter aspirates and flushes normally. The catheter was flushed with  appropriate volume heparin dwells. The catheter exit site was secured with a 0-Prolene retention suture. The venotomy incision was closed with Dermabond and Steri-strips. Dressings were applied.  The patient tolerated the procedure well without immediate post procedural complication. FINDINGS: After catheter placement, the tip lies within the superior cavoatrial junction. The catheter aspirates and flushes normally and is ready for immediate use. IMPRESSION: Successful placement of 24 cm dual lumen tunneled central venous catheter via the left internal jugular vein with tip terminating at the superior caval atrial junction. The catheter is ready for immediate use. Electronically Signed   By: Simonne Come M.D.   On: 12/14/2023 15:26   VAS [place] UPPER EXTREMITY VENOUS DUPLEX Result Date: 12/14/2023 UPPER VENOUS STUDY  Patient Name:  Jill Higgins  Date of Exam:   12/14/2023 Medical Rec #: [id number]        Accession #:    [id number] Date of Birth: November 06, 1946         Patient Gender: F Patient Age:   50 years Exam Location:  [facility] Procedure:      VAS [place] UPPER EXTREMITY VENOUS DUPLEX Referring Phys: Marguerita Merles --------------------------------------------------------------------------------  Indications: Swelling Risk Factors: None identified. Comparison Study: None. Performing Technologist: Shona Simpson  Examination Guidelines: A complete evaluation includes B-mode imaging, spectral Doppler, color Doppler, and power Doppler as needed of all accessible portions of each vessel. Bilateral testing is considered an integral part of a complete examination. Limited examinations for reoccurring indications may be performed as noted.  Right Findings: +----------+------------+---------+-----------+----------+-------+ RIGHT     CompressiblePhasicitySpontaneousPropertiesSummary +----------+------------+---------+-----------+----------+-------+ IJV           Full       Yes       Yes                       +----------+------------+---------+-----------+----------+-------+ Subclavian    Full       Yes       Yes                      +----------+------------+---------+-----------+----------+-------+ Axillary      Full       Yes       Yes                      +----------+------------+---------+-----------+----------+-------+ Brachial      Full       Yes       Yes                      +----------+------------+---------+-----------+----------+-------+ Radial        Full       Yes       Yes                      +----------+------------+---------+-----------+----------+-------+ Ulnar         Full       Yes       Yes                      +----------+------------+---------+-----------+----------+-------+ Cephalic      None                 No      dilated   Acute  +----------+------------+---------+-----------+----------+-------+ Basilic       Full                 Yes                      +----------+------------+---------+-----------+----------+-------+  Left Findings: +----------+------------+---------+-----------+----------+-------+ LEFT  CompressiblePhasicitySpontaneousPropertiesSummary +----------+------------+---------+-----------+----------+-------+ Subclavian    Full       Yes       Yes                      +----------+------------+---------+-----------+----------+-------+  Summary:  Right: No evidence of deep vein thrombosis in the upper extremity. Findings consistent with acute superficial vein thrombosis involving the right cephalic vein. Occlusive Acute SVT seen in the Cephalic Vein in the proximal upper arm.  Left: No evidence of thrombosis in the subclavian.  *See table(s) above for measurements and observations.    Preliminary    [place] EKG SITE RITE Result Date: 12/13/2023 If Site Rite image not attached, placement could not be confirmed due to current cardiac rhythm.  ECHO TEE Result Date: 12/13/2023    TRANSESOPHOGEAL ECHO REPORT   Patient  Name:   Jill Higgins Date of Exam: 12/13/2023 Medical Rec #:  [id number]       Height:       63.0 in Accession #:    [id number]      Weight:       110.2 lb Date of Birth:  1946-08-26        BSA:          1.501 m Patient Age:    23 years        BP:           124/54 mmHg Patient Gender: F               HR:           76 bpm. Exam Location:  Inpatient Procedure: Transesophageal Echo, Cardiac Doppler and Color Doppler Indications:     Endocarditis  History:         Patient has prior history of Echocardiogram examinations, most                  recent 09/08/2024. PAD; Risk Factors:Hypertension, Diabetes and                  Dyslipidemia.  Sonographer:     Lucendia Herrlich RCS Referring Phys:  [id number] [facility] Diagnosing Phys: Chilton Si MD PROCEDURE: After discussion of the risks and benefits of a TEE, an informed consent was obtained from the patient. The transesophogeal probe was passed without difficulty through the esophogus of the patient. Imaged were obtained with the patient in a left lateral decubitus position. Sedation performed by different physician. The patient was monitored while under deep sedation. Anesthestetic sedation was provided intravenously by Anesthesiology: 90mg  of Propofol, 50mg  of Lidocaine. The patient's vital  signs; including heart rate, blood pressure, and oxygen saturation; remained stable throughout the procedure. The patient developed no complications during the procedure.  IMPRESSIONS  1. Left ventricular ejection fraction, by estimation, is 60 to 65%. The left ventricle has normal function. The left ventricle has no regional wall motion abnormalities.  2. Right ventricular systolic function is normal. The right ventricular size is normal.  3. No left atrial/left atrial appendage thrombus was detected.  4. The mitral valve is normal in structure. Trivial mitral valve regurgitation. No evidence of mitral stenosis.  5. The aortic valve is tricuspid. Aortic valve regurgitation  is not visualized. No aortic stenosis is present.  6. The inferior vena cava is normal in size with greater than 50% respiratory variability, suggesting right atrial pressure of 3 mmHg. Conclusion(s)/Recommendation(s): Normal biventricular function without evidence of hemodynamically significant valvular heart disease. No evidence of vegetation/infective endocarditis  on this transesophageael echocardiogram. FINDINGS  Left Ventricle: Left ventricular ejection fraction, by estimation, is 60 to 65%. The left ventricle has normal function. The left ventricle has no regional wall motion abnormalities. The left ventricular internal cavity size was normal in size. There is  no left ventricular hypertrophy. Right Ventricle: The right ventricular size is normal. No increase in right ventricular wall thickness. Right ventricular systolic function is normal. Left Atrium: Left atrial size was normal in size. No left atrial/left atrial appendage thrombus was detected. Right Atrium: Right atrial size was normal in size. Pericardium: There is no evidence of pericardial effusion. Mitral Valve: The mitral valve is normal in structure. Trivial mitral valve regurgitation. No evidence of mitral valve stenosis. Tricuspid Valve: The tricuspid valve is normal in structure. Tricuspid valve regurgitation is trivial. No evidence of tricuspid stenosis. Aortic Valve: The aortic valve is tricuspid. Aortic valve regurgitation is not visualized. No aortic stenosis is present. Pulmonic Valve: The pulmonic valve was normal in structure. Pulmonic valve regurgitation is trivial. No evidence of pulmonic stenosis. Aorta: The aortic root is normal in size and structure. There is minimal (Grade I) atheroma plaque involving the descending aorta. Venous: The inferior vena cava is normal in size with greater than 50% respiratory variability, suggesting right atrial pressure of 3 mmHg. IAS/Shunts: No atrial level shunt detected by color flow Doppler.  Additional Comments: Spectral Doppler performed. TRICUSPID VALVE TR Peak grad:   22.8 mmHg TR Vmax:        239.00 cm/s Chilton Si MD Electronically signed by Chilton Si MD Signature Date/Time: 12/13/2023/2:23:52 PM    Final    EP STUDY Result Date: 12/13/2023 See surgical note for result.  Scheduled Meds:  aspirin EC  81 mg Oral QHS   atorvastatin  80 mg Oral Daily   clopidogrel  75 mg Oral Q breakfast   docusate sodium  100 mg Oral Daily   DULoxetine  60 mg Oral Daily   heparin injection (subcutaneous)  5,000 Units Subcutaneous Q8H   insulin aspart  0-9 Units Subcutaneous TID WC   insulin glargine-yfgn  5 Units Subcutaneous Daily   lactose free nutrition  237 mL Oral BID BM   multivitamin with minerals  1 tablet Oral Daily   pantoprazole  40 mg Oral BID   sodium chloride flush  3 mL Intravenous Q12H   Continuous Infusions:  DAPTOmycin 500 mg (12/14/23 1327)   magnesium sulfate bolus IVPB     magnesium sulfate bolus IVPB      LOS: 7 days   Marguerita Merles, DO [facility] Available via Epic secure chat 7am-7pm After these hours, please refer to coverage provider listed on [email or web address] 12/14/2023, 6:01 PM

## 2023-12-14 NOTE — Consult Note (Signed)
WOC Nurse Consult Note: Vascular team following for assessment and plan of care.  Requested to change Vac dressing. Pt was medicated for pain prior to the procedure and tolerated with minimal amt discomfort.  Right groin with full thickness post-op wound; 4X2X.3cm, beefy red, minimal amt pink drainage in the cannister.  Applied barrier ring to wound edges to attempt to maintain a seal, then one piece black foam to cont suction.  WOC team will plan to change again on Mon and then begin twice a week changes on Mon/Thurs. Thank-you,  Cammie Mcgee MSN, RN, CWOCN, [place], CNS [phone]

## 2023-12-14 NOTE — Progress Notes (Signed)
Upper extremity venous duplex completed. Please see CV Procedures for preliminary results.  Initial findings reported to Melony Overly, Charity fundraiser.  Shona Simpson, RVT 12/14/23 1:13 PM

## 2023-12-14 NOTE — Progress Notes (Addendum)
  Progress Note    12/14/2023 8:20 AM 1 Day Post-Op  Subjective:  no complaints   Vitals:   12/13/23 2314 12/14/23 0403  BP: (!) 117/91 99/84  Pulse: 72 68  Resp: 15 15  Temp: 98.1 F (36.7 C) 97.6 F (36.4 C)  SpO2: 99% 100%   Physical Exam: Lungs:  non labored Incisions:  R chest, R groin, L arm c/d/I; R groin vac with good seal Extremities:  palpable R radial; brisk R DP by doppler; palpable L DP Neurologic: A&O  CBC    Component Value Date/Time   WBC 12.1 (H) 12/13/2023 1014   RBC 3.17 (L) 12/13/2023 1014   HGB 10.1 (L) 12/13/2023 1014   HCT 30.3 (L) 12/13/2023 1014   PLT 227 12/13/2023 1014   MCV 95.6 12/13/2023 1014   MCH 31.9 12/13/2023 1014   MCHC 33.3 12/13/2023 1014   RDW 13.9 12/13/2023 1014   LYMPHSABS 2.1 12/13/2023 1014   MONOABS 1.0 12/13/2023 1014   EOSABS 0.1 12/13/2023 1014   BASOSABS 0.0 12/13/2023 1014    BMET    Component Value Date/Time   NA 136 12/13/2023 1014   K 4.0 12/13/2023 1014   CL 105 12/13/2023 1014   CO2 21 (L) 12/13/2023 1014   GLUCOSE 177 (H) 12/13/2023 1014   BUN 13 12/13/2023 1014   CREATININE 0.53 12/13/2023 1014   CALCIUM 7.9 (L) 12/13/2023 1014   GFRNONAA >60 12/13/2023 1014   GFRAA >60 09/04/2019 0249    INR    Component Value Date/Time   INR 1.3 (H) 12/07/2023 2024     Intake/Output Summary (Last 24 hours) at 12/14/2023 0820 Last data filed at 12/14/2023 0300 Gross per 24 hour  Intake 150 ml  Output 650 ml  Net -500 ml     Assessment/Plan:  78 y.o. female is s/p  R ax-fem bypass excision  1 Day Post-Op   R foot well perfused with brisk DP by doppler; palpable L DP pulse R hand well perfused with palpable radial; edema improved since removal of IV Vac changed by WOC RN today; next change on Monday; TOC arranging HH RN and home vac Patient will require tunneled line with IR for home IV antibiotics; unable to place PICC   Emilie Rutter, PA-C [facility] [phone] 12/14/2023 8:20 AM   I have seen and evaluated the patient. I agree with the PA note as documented above.  Status post excision of infected right axillofemoral bypass with vein patch of right axillary artery and vein patch right fem distal bypass..  Right chest wall incision looks good.  Palpable radial pulse at the right wrist.  Right groin with VAC after vein patch.  Monophasic DP PT signal on the right foot without any significant complaints.  ID following for MRSA and appreciate their input.  3 weeks IV antibiotics with daptomycin given bacteremia.  Will need home VAC for the right groin.  Cephus Shelling, MD [facility] [place] Office: [phone]

## 2023-12-14 NOTE — Progress Notes (Signed)
Physical Therapy Treatment Patient Details Name: Jill Higgins MRN: [id number] DOB: 03-09-1946 Today's Date: 12/14/2023   History of Present Illness The pt is a 78 yo female presenting 1/17 with drainage from R groin incision (recent R axillary - femoral bypass on 12/26). Now s/p redo R axilla - femoral artery bypass with wound vac placement 1/19. PMH includes: ight common femoral endarterectomy with right femoral to above-knee popliteal bypass, stenting of the right iliac and common femoral arteries, bronchitis, bulging disc, depression w/anxiety, DM II, fibromyalgia, GERD, glaucoma, hyperlipidemia, HTN, PVD, and sleep apnea.    PT Comments  Pt received in bed, agreeable to participation in therapy. Completed ambulation trial then assist to/from bathroom for BM. She required min assist bed mobility, min assist transfers, and min assist amb 50' with RW. Increased time required to complete all functional mobility skills. Pt in recliner at end of session with feet elevated. She continues slow mobility progress, still requiring physical assist. Discharge rec updated to SNF.     If plan is discharge home, recommend the following: Help with stairs or ramp for entrance;Assist for transportation;Assistance with cooking/housework;A lot of help with walking and/or transfers;A little help with bathing/dressing/bathroom   Can travel by private vehicle        Equipment Recommendations  None recommended by PT    Recommendations for Other Services       Precautions / Restrictions Precautions Precautions: Fall;Other (comment) Precaution Comments: wound vac R groin     Mobility  Bed Mobility Overal bed mobility: Needs Assistance Bed Mobility: Supine to Sit     Supine to sit: HOB elevated, Min assist, Used rails     General bed mobility comments: increased time    Transfers Overall transfer level: Needs assistance Equipment used: Rolling walker (2 wheels) Transfers: Sit to/from Stand Sit  to Stand: Min assist           General transfer comment: cues for hand placement and sequencing, assist to power up    Ambulation/Gait Ambulation/Gait assistance: Min assist Gait Distance (Feet): 50 Feet Assistive device: Rolling walker (2 wheels) Gait Pattern/deviations: Step-through pattern, Decreased stride length, Trunk flexed Gait velocity: decreased Gait velocity interpretation: <1.31 ft/sec, indicative of household ambulator   General Gait Details: assist to maintain balance and manage RW   Stairs             Wheelchair Mobility     Tilt Bed    Modified Rankin (Stroke Patients Only)       Balance Overall balance assessment: Needs assistance Sitting-balance support: Feet supported, No upper extremity supported Sitting balance-Leahy Scale: Good     Standing balance support: Bilateral upper extremity supported, During functional activity, Reliant on assistive device for balance Standing balance-Leahy Scale: Poor                              Cognition Arousal: Alert Behavior During Therapy: Flat affect Overall Cognitive Status: Impaired/Different from baseline Area of Impairment: Attention, Following commands, Safety/judgement, Awareness, Problem solving, Memory                   Current Attention Level: Sustained Memory: Decreased short-term memory Following Commands: Follows one step commands with increased time Safety/Judgement: Decreased awareness of deficits, Decreased awareness of safety Awareness: Emergent Problem Solving: Slow processing, Decreased initiation, Difficulty sequencing, Requires verbal cues, Requires tactile cues          Exercises  General Comments General comments (skin integrity, edema, etc.): VSS on RA      Pertinent Vitals/Pain Pain Assessment Pain Assessment: Faces Faces Pain Scale: Hurts a little bit Pain Location: R groin Pain Descriptors / Indicators: Discomfort, Grimacing Pain  Intervention(s): Monitored during session, Repositioned    Home Living                          Prior Function            PT Goals (current goals can now be found in the care plan section) Acute Rehab PT Goals Patient Stated Goal: home Progress towards PT goals: Progressing toward goals    Frequency    Min 1X/week      PT Plan      Co-evaluation              AM-PAC PT "6 Clicks" Mobility   Outcome Measure  Help needed turning from your back to your side while in a flat bed without using bedrails?: A Little Help needed moving from lying on your back to sitting on the side of a flat bed without using bedrails?: A Lot Help needed moving to and from a bed to a chair (including a wheelchair)?: A Little Help needed standing up from a chair using your arms (e.g., wheelchair or bedside chair)?: A Little Help needed to walk in hospital room?: A Little Help needed climbing 3-5 steps with a railing? : Total 6 Click Score: 15    End of Session Equipment Utilized During Treatment: Gait belt Activity Tolerance: Patient tolerated treatment well Patient left: in chair;with call bell/phone within reach;with chair alarm set Nurse Communication: Mobility status PT Visit Diagnosis: Other abnormalities of gait and mobility (R26.89);Pain Pain - Right/Left: Right Pain - part of body: Hip     Time: 1610-9604 PT Time Calculation (min) (ACUTE ONLY): 31 min  Charges:    $Gait Training: 8-22 mins $Therapeutic Activity: 8-22 mins PT General Charges $$ ACUTE PT VISIT: 1 Visit                     Ferd Glassing., PT  Office # [phone]    Ilda Foil 12/14/2023, 10:36 AM

## 2023-12-15 DIAGNOSIS — K861 Other chronic pancreatitis: Secondary | ICD-10-CM | POA: Diagnosis not present

## 2023-12-15 DIAGNOSIS — F418 Other specified anxiety disorders: Secondary | ICD-10-CM | POA: Diagnosis not present

## 2023-12-15 DIAGNOSIS — T827XXA Infection and inflammatory reaction due to other cardiac and vascular devices, implants and grafts, initial encounter: Secondary | ICD-10-CM | POA: Diagnosis not present

## 2023-12-15 DIAGNOSIS — R7881 Bacteremia: Secondary | ICD-10-CM | POA: Diagnosis not present

## 2023-12-15 LAB — CBC WITH DIFFERENTIAL/PLATELET
Abs Immature Granulocytes: 0.09 10*3/uL — ABNORMAL HIGH (ref 0.00–0.07)
Basophils Absolute: 0 10*3/uL (ref 0.0–0.1)
Basophils Relative: 0 %
Eosinophils Absolute: 0.2 10*3/uL (ref 0.0–0.5)
Eosinophils Relative: 3 %
HCT: 26.7 % — ABNORMAL LOW (ref 36.0–46.0)
Hemoglobin: 9 g/dL — ABNORMAL LOW (ref 12.0–15.0)
Immature Granulocytes: 1 %
Lymphocytes Relative: 28 %
Lymphs Abs: 2.2 10*3/uL (ref 0.7–4.0)
MCH: 31.6 pg (ref 26.0–34.0)
MCHC: 33.7 g/dL (ref 30.0–36.0)
MCV: 93.7 fL (ref 80.0–100.0)
Monocytes Absolute: 0.5 10*3/uL (ref 0.1–1.0)
Monocytes Relative: 6 %
Neutro Abs: 4.7 10*3/uL (ref 1.7–7.7)
Neutrophils Relative %: 62 %
Platelets: 302 10*3/uL (ref 150–400)
RBC: 2.85 MIL/uL — ABNORMAL LOW (ref 3.87–5.11)
RDW: 13.8 % (ref 11.5–15.5)
WBC: 7.7 10*3/uL (ref 4.0–10.5)
nRBC: 0 % (ref 0.0–0.2)

## 2023-12-15 LAB — GLUCOSE, CAPILLARY
Glucose-Capillary: 123 mg/dL — ABNORMAL HIGH (ref 70–99)
Glucose-Capillary: 76 mg/dL (ref 70–99)
Glucose-Capillary: 82 mg/dL (ref 70–99)
Glucose-Capillary: 128 mg/dL — ABNORMAL HIGH (ref 70–99)
Glucose-Capillary: 51 mg/dL — ABNORMAL LOW (ref 70–99)
Glucose-Capillary: 100 mg/dL — ABNORMAL HIGH (ref 70–99)

## 2023-12-15 LAB — COMPREHENSIVE METABOLIC PANEL
ALT: 17 U/L (ref 0–44)
AST: 27 U/L (ref 15–41)
Albumin: 1.8 g/dL — ABNORMAL LOW (ref 3.5–5.0)
Alkaline Phosphatase: 77 U/L (ref 38–126)
Anion gap: 9 (ref 5–15)
BUN: 8 mg/dL (ref 8–23)
CO2: 24 mmol/L (ref 22–32)
Calcium: 8.1 mg/dL — ABNORMAL LOW (ref 8.9–10.3)
Chloride: 106 mmol/L (ref 98–111)
Creatinine, Ser: 0.68 mg/dL (ref 0.44–1.00)
GFR, Estimated: >60 mL/min (ref >60)
Glucose, Bld: 130 mg/dL — ABNORMAL HIGH (ref 70–99)
Potassium: 4 mmol/L (ref 3.5–5.1)
Sodium: 139 mmol/L (ref 135–145)
Total Bilirubin: 0.6 mg/dL (ref 0.0–1.2)
Total Protein: 4.8 g/dL — ABNORMAL LOW (ref 6.5–8.1)

## 2023-12-15 LAB — MAGNESIUM: Magnesium: 1.5 mg/dL — ABNORMAL LOW (ref 1.7–2.4)

## 2023-12-15 LAB — PHOSPHORUS: Phosphorus: 3.8 mg/dL (ref 2.5–4.6)

## 2023-12-15 MED ORDER — INSULIN GLARGINE-YFGN 100 UNIT/ML ~~LOC~~ SOLN
3.0000 [IU] | Freq: Every day | SUBCUTANEOUS | Status: DC
  Administered 2023-12-17 – 2023-12-19 (×3): 3 [IU] via SUBCUTANEOUS
  Filled 2023-12-15 (×4): qty 0.03

## 2023-12-15 MED ORDER — CHLORHEXIDINE GLUCONATE CLOTH 2 % EX PADS
6.0000 | MEDICATED_PAD | Freq: Every day | CUTANEOUS | Status: DC
  Administered 2023-12-15 – 2023-12-19 (×5): 6 via TOPICAL

## 2023-12-15 MED ORDER — MAGNESIUM SULFATE 4 GM/100ML IV SOLN
4.0000 g | Freq: Once | INTRAVENOUS | Status: AC
  Administered 2023-12-15: 4 g via INTRAVENOUS
  Filled 2023-12-15: qty 100

## 2023-12-15 NOTE — Progress Notes (Signed)
Pt ambulated approximately 200' in hallway with front wheel walker on room air.  Needed some support steadying upon standing.  Ambulated slowly but tolerated quite well.  No complaints of pain at groin site, however still with pain in R shoulder. Back to recliner in room. Call bell in reach.

## 2023-12-15 NOTE — Progress Notes (Signed)
Hypoglycemic Event  CBG: 51  Treatment: 4 oz juice/soda  Symptoms: None  Follow-up CBG: Time:0940 CBG Result:100  Possible Reasons for Event: Inadequate meal intake   Kalman Jewels, Lambert Keto

## 2023-12-15 NOTE — Progress Notes (Signed)
PROGRESS NOTE    Jill Higgins  AVW:[id number] DOB: 11/07/1946 DOA: 12/07/2023 PCP: Merri Brunette, MD   Brief Narrative:  Jill Higgins is a 78 y.o. female with a history of hypertension, diabetes mellitus type 2, depression, anxiety, PAD with thrombosis of right extremity iliac/common femoral and disabling claudication status post axillofemoral bypass graft.  Patient presented secondary to discharge from groin incision with associated chills concerning for evidence of infection.  Patient met sepsis criteria on admission to start empiric antibiotics.  Blood cultures obtained prior to antibiotics.  Vascular surgery consulted with plan for surgical management.   **Interim History She is found to have MRSA bacteremia and ID has been consulted and has been changed to IV Dapto.  She underwent surgical intervention for her right axillofemoral bypass graft infection.  PT OT recommending SNF and was assisting with disposition now that PICC line is in and she appears medically stable.  Assessment and Plan:  Severe Sepsis -Present on admission with associated lactic acidosis.  -Secondary to graft infection.  -Blood cultures (1/17) obtained on admission and are significant for MRSA.  -Repeat CX from 1/19 showing NGTD at 5 Days -Empiric Vancomycin and Cefepime started and transitioned to Vancomycin monotherapy and now Daptomycin.  -ID consulted and appreciate evaluation recommendations and have signed off the case   Right Axillo-Femoral Bypass Graft Infection -Present on admission. Surgery performed on 12/26.  -Vascular surgery consulted and are following.  -WBC Trend had improved but slightly trended up: Recent Labs  Lab 12/09/23 0307 12/10/23 0831 12/11/23 0335 12/12/23 0324 12/13/23 1014 12/14/23 0929 12/15/23 0500  WBC 13.9* 11.7* 7.9 9.4 12.1* 9.1 7.7  -Cefepime discontinued. Patient underwent removal of bypass on 1/19; culture significant for GPCs -Vancomycin changed to IV  Daptomycin by ID -Follow-up graft culture data and it shows:  ABUNDANT METHICILLIN RESISTANT STAPHYLOCOCCUS AUREUS NO ANAEROBES ISOLATED; CULTURE IN PROGRESS FOR 5 DAYS   Report Status PENDING  Organism ID, Bacteria METHICILLIN RESISTANT STAPHYLOCOCCUS AUREUS  Resulting Agency [facility]     Susceptibility    Methicillin resistant staphylococcus aureus    MIC    CIPROFLOXACIN >=8 RESISTANT Resistant    CLINDAMYCIN <=0.25 SENS... Sensitive    ERYTHROMYCIN <=0.25 SENS... Sensitive    GENTAMICIN <=0.5 SENSI... Sensitive    Inducible Clindamycin NEGATIVE Sensitive    LINEZOLID 2 SENSITIVE Sensitive    OXACILLIN >=4 RESISTANT Resistant    RIFAMPIN <=0.5 SENSI... Sensitive    TETRACYCLINE <=1 SENSITIVE Sensitive    TRIMETH/SULFA <=10 SENSIT... Sensitive    VANCOMYCIN <=0.5 SENSI... Sensitive      -Right forearm and Hand edematous will check RUE Duplex to Evaluate for DVT -Next VAC changes on Monday and TOC is involved as the PT evaluation has now changed the recommendation for SNF   MRSA Bacteremia -Source is likely right groin wound. ID consulted -Patient was managed on Vancomycin. Repeat blood cultures (1/19) obtained and are no growth to date. Transthoracic Echocardiogram significant for no evidence of valvular vegetations. -ID recommendations: Vancomycin now changed to Daptomycin on 12/11/23(plan for 4-6 weeks), -Cardiology consulted for Transesophageal Echocardiogram which was recommended by ID and this was done today and showed now LA/LAA Thrombus or masses with EF of 60-65% -Since her blood cultures are negative she required a IJ tunneled PICC line given her anatomy.  This is now in place and she appears medically stable for discharge needs to select a SNF and have insurance authorization  Right arm swelling -Upper extremity duplex done and showed an  SVT -U/S formal read showed "No evidence of deep vein thrombosis in the upper extremity. Findings consistent with acute  superficial vein thrombosis involving the right cephalic vein. Occlusive Acute SVT seen in the Cephalic Vein in the proximal upper arm." -C/w Supportive Care   PAD -S/p axillo-femoral bypass graft.  -Patient follows with vascular surgery as an outpatient.  -LDL is down to 15. -Continue Atorvastatin 80 mg po Daily, Aspirin 81 mg po qHS and Clopidogrel 75 mg po Daily    Primary Hypertension -Patient is on Amlodipine, Losartan and Toprol Xl as an outpatient. -Antihypertensives held on admission secondary to hypotension. -Continue to Monitor BP per Protocol -Last BP reading was 138/75   Diabetes Mellitus, Type 2 -Uncontrolled with hyperglycemia.  -Hold home Metformin 500 mg po BID -Last hemoglobin A1C of 9.5%.  -Patient is managed on insulin glargine, insulin aspart, and metformin as an outpatient and started on SSI on admission. Blood sugar low-normal initially, now worsening. -Continue Sensitive Novolog SSI AC and  Semglee 5 units daily -CBG Trend: Recent Labs  Lab 12/14/23 1322 12/14/23 1643 12/14/23 2144 12/15/23 0604 12/15/23 1239 12/15/23 1310 12/15/23 1606  GLUCAP 185* 194* 148* 123* 76 82 128*    Depression and Anxiety -Continue Duloxetine 60 mg po Daily  Metabolic Acidosis -Mild and improved. CO2 is now 24, AG is 8 , Chloride Level is now 106 -Continue to Monitor and Trend and repeat CMP in the AM   Insomnia -Continue Temazepam 30 mg po qHSprn Sleep  Hypokalemia -Patient's K+ Level Trend: Recent Labs  Lab 12/09/23 1045 12/10/23 0355 12/11/23 0335 12/12/23 0324 12/13/23 1014 12/14/23 0929 12/15/23 0500  K 3.9 3.7 3.2* 3.5 4.0 3.8 4.0  -Continue to Monitor and Replete as Necessary -Repeat CMP in the AM   Hypomagnesemia -Patient's Mag Level Trend: Recent Labs  Lab 12/08/23 0529 12/10/23 0355 12/12/23 0324 12/13/23 1014 12/14/23 0929 12/15/23 0500  MG 1.0* 2.0 1.5* 1.6* 1.8 1.5*  -Replete with IV Mag Sulfate 4 g today -Continue to Monitor and  Replete as Necessary -Repeat Mag in the AM   ABLA and Perioperative Blood loss superimposed on Normocytic Anemia -Secondary to vascular surgery. Patient with hemoglobin down to 7.1 g/dL requiring a transfusion of 2 units of PRBC. Posttransfusion hemoglobin of 10.0, down to 8.2 today  -Hgb/Hct Trend: Recent Labs  Lab 12/09/23 1045 12/10/23 0831 12/11/23 0335 12/12/23 0324 12/13/23 1014 12/14/23 0929 12/15/23 0500  HGB 7.1* 10.0* 8.6* 8.2* 10.1* 9.8* 9.0*  HCT 21.0* 28.4* 24.6* 24.4* 30.3* 29.3* 26.7*  MCV  --  91.6 93.2 94.2 95.6 94.5 93.7  -Continue to Monitor for S/Sx of Bleeding; No overt bleeding noted -Repeat CBC in the AM   Hypoalbuminemia -Patient's Albumin Trend: Recent Labs  Lab 12/08/23 0529 12/09/23 0307 12/10/23 0355 12/11/23 0335 12/13/23 1014 12/14/23 0929 12/15/23 0500  ALBUMIN 1.7* 1.7* 1.8* 1.7* 1.8* 1.9* 1.8*  -Continue to Monitor and Trend and repeat CMP in the AM    DVT prophylaxis: heparin injection 5,000 Units Start: 12/12/23 0800 SCD's Start: 12/09/23 1835 SCDs Start: 12/07/23 2343    Code Status: Full Code Family Communication: No family currently at bedside  Disposition Plan:  Level of care: Progressive Status is: Inpatient Remains inpatient appropriate because: She is medically stable for discharge and needs to select a SNF and have insurance authorization   Consultants:  Vascular Surgery Infectious Diseases  Cardiology for TEE  Procedures:  As delineated as above   Procedure by Dr. Chestine Spore: 1.  Harvest of  left arm basilic vein 2.  Redo exposure of right axillary artery 3.  Excision of right axillary artery to femoral artery bypass 4.  Vein patch angioplasty right axillary artery 5.  Vein patch angioplasty of right femoral artery distal bypass 6.  Vac right groin  Antimicrobials:  Anti-infectives (From admission, onward)    Start     Dose/Rate Route Frequency Ordered Stop   12/11/23 1400  DAPTOmycin (CUBICIN) IVPB 500 mg/51mL  premix        500 mg 100 mL/hr over 30 Minutes Intravenous Daily 12/11/23 0836     12/10/23 1400  DAPTOmycin (CUBICIN) 400 mg in sodium chloride 0.9 % IVPB  Status:  Discontinued        8 mg/kg  50 kg 116 mL/hr over 30 Minutes Intravenous Daily 12/10/23 1111 12/11/23 0836   12/09/23 2100  vancomycin (VANCOCIN) 750 mg in sodium chloride 0.9 % 250 mL IVPB  Status:  Discontinued        750 mg 265 mL/hr over 60 Minutes Intravenous Every 24 hours 12/09/23 1759 12/10/23 1111   12/09/23 0800  vancomycin (VANCOCIN) IVPB 1000 mg/200 mL premix  Status:  Discontinued        1,000 mg 200 mL/hr over 60 Minutes Intravenous  Once 12/09/23 0746 12/09/23 0807   12/09/23 0748  vancomycin (VANCOCIN) 1-5 GM/200ML-% IVPB       Note to Pharmacy: Aquilla Hacker M: cabinet override      12/09/23 0748 12/09/23 1959   12/08/23 2100  vancomycin (VANCOCIN) 750 mg in sodium chloride 0.9 % 250 mL IVPB  Status:  Discontinued        750 mg 250 mL/hr over 60 Minutes Intravenous Every 24 hours 12/07/23 2359 12/09/23 1759   12/07/23 2100  vancomycin (VANCOCIN) IVPB 1000 mg/200 mL premix        1,000 mg 200 mL/hr over 60 Minutes Intravenous  Once 12/07/23 2048 12/07/23 2313   12/07/23 2100  ceFEPIme (MAXIPIME) 2 g in sodium chloride 0.9 % 100 mL IVPB  Status:  Discontinued        2 g 200 mL/hr over 30 Minutes Intravenous Every 12 hours 12/07/23 2053 12/08/23 1519       Subjective: Seen and examined at bedside and she is better today and still complaining about some pain in her left upper chest where her IJ PICC line has been placed.  No nausea or vomiting.  Feels okay.  Denies any complaints or concerns at this time  Objective: Vitals:   12/15/23 0427 12/15/23 0830 12/15/23 1243 12/15/23 1456  BP: 133/65 (!) 121/59 125/65 132/62  Pulse: 68 75 70   Resp: 18 14 16 14   Temp: 98.2 F (36.8 C) 97.7 F (36.5 C) 98.3 F (36.8 C) 97.7 F (36.5 C)  TempSrc: Oral Oral Oral Oral  SpO2: 100% 100% 99% 99%  Weight:       Height:        Intake/Output Summary (Last 24 hours) at 12/15/2023 1749 Last data filed at 12/15/2023 1244 Gross per 24 hour  Intake --  Output 1500 ml  Net -1500 ml   Filed Weights   12/07/23 2006 12/09/23 0648  Weight: 50 kg 50 kg   Examination: Physical Exam:  Constitutional: Thin female who is chronically ill-appearing in no acute distress appears improved Respiratory: Diminished to auscultation bilaterally, no wheezing, rales, rhonchi or crackles. Normal respiratory effort and patient is not tachypenic. No accessory muscle use.  Unlabored breathing Cardiovascular: RRR, no murmurs / rubs /  gallops. S1 and S2 auscultated.  Trace extremity edema and has some right arm swelling Abdomen: Soft, non-tender, non-distended. Bowel sounds positive.  GU: Deferred. Musculoskeletal: No clubbing / cyanosis of digits/nails. No joint deformity upper and lower extremities.  Left IJ PICC is noted Neurologic: CN 2-12 grossly intact with no focal deficits. Romberg sign and cerebellar reflexes not assessed.  Psychiatric: Normal judgment and insight.  Alert and alert and oriented  Data Reviewed: I have personally reviewed following labs and imaging studies  CBC: Recent Labs  Lab 12/11/23 0335 12/12/23 0324 12/13/23 1014 12/14/23 0929 12/15/23 0500  WBC 7.9 9.4 12.1* 9.1 7.7  NEUTROABS  --   --  8.8* 5.3 4.7  HGB 8.6* 8.2* 10.1* 9.8* 9.0*  HCT 24.6* 24.4* 30.3* 29.3* 26.7*  MCV 93.2 94.2 95.6 94.5 93.7  PLT 115* 148* 227 301 302   Basic Metabolic Panel: Recent Labs  Lab 12/10/23 0355 12/11/23 0335 12/12/23 0324 12/13/23 1014 12/14/23 0929 12/15/23 0500  NA 137 141 135 136 138 139  K 3.7 3.2* 3.5 4.0 3.8 4.0  CL 107 110 107 105 106 106  CO2 24 21* 23 21* 24 24  GLUCOSE 117* 232* 105* 177* 126* 130*  BUN 7* 5* 9 13 8 8   CREATININE 0.71 0.76 0.55 0.53 0.52 0.68  CALCIUM 7.4* 7.2* 7.2* 7.9* 8.1* 8.1*  MG 2.0  --  1.5* 1.6* 1.8 1.5*  PHOS  --   --   --  3.3 3.5 3.8    GFR: Estimated Creatinine Clearance: 45.7 mL/min (by C-G formula based on SCr of 0.68 mg/dL). Liver Function Tests: Recent Labs  Lab 12/10/23 0355 12/11/23 0335 12/13/23 1014 12/14/23 0929 12/15/23 0500  AST 20 23 25 18 27   ALT 15 17 19 17 17   ALKPHOS 38 45 70 73 77  BILITOT 0.8 0.6 0.5 0.4 0.6  PROT 3.7* 3.8* 4.6* 5.0* 4.8*  ALBUMIN 1.8* 1.7* 1.8* 1.9* 1.8*   No results for input(s): "LIPASE", "AMYLASE" in the last 168 hours. No results for input(s): "AMMONIA" in the last 168 hours. Coagulation Profile: No results for input(s): "INR", "PROTIME" in the last 168 hours. Cardiac Enzymes: Recent Labs  Lab 12/11/23 0335 12/13/23 1014  CKTOTAL 114 44   BNP (last 3 results) No results for input(s): "PROBNP" in the last 8760 hours. HbA1C: No results for input(s): "HGBA1C" in the last 72 hours. CBG: Recent Labs  Lab 12/14/23 2144 12/15/23 0604 12/15/23 1239 12/15/23 1310 12/15/23 1606  GLUCAP 148* 123* 76 82 128*   Lipid Profile: No results for input(s): "CHOL", "HDL", "LDLCALC", "TRIG", "CHOLHDL", "LDLDIRECT" in the last 72 hours. Thyroid Function Tests: No results for input(s): "TSH", "T4TOTAL", "FREET4", "T3FREE", "THYROIDAB" in the last 72 hours. Anemia Panel: No results for input(s): "VITAMINB12", "FOLATE", "FERRITIN", "TIBC", "IRON", "RETICCTPCT" in the last 72 hours. Sepsis Labs: No results for input(s): "PROCALCITON", "LATICACIDVEN" in the last 168 hours.  Recent Results (from the past 240 hours)  MIC (1 Drug)-Blood Culture; 12/07/2023; BLOOD; Staph aureus; Daptomycin     Status: None   Collection Time: 12/07/23 11:55 AM   Specimen: BLOOD  Result Value Ref Range Status   Min Inhibitory Conc (1 Drug) Preliminary report  Final    Comment: (NOTE) Performed At: [facility] [address] [place], [place] [id number] Jolene Schimke MD JY:[id number]    Source BLOOD  Final    Comment: Performed at [facility], [address], [place],  [place] [address]  Culture, blood (Routine x  2)     Status: Abnormal (Preliminary result)   Collection Time: 12/07/23  8:17 PM   Specimen: BLOOD  Result Value Ref Range Status   Specimen Description BLOOD LEFT ANTECUBITAL  Final   Special Requests   Final    BOTTLES DRAWN AEROBIC AND ANAEROBIC Blood Culture results may not be optimal due to an inadequate volume of blood received in culture bottles   Culture  Setup Time   Final    GRAM POSITIVE COCCI ANAEROBIC BOTTLE ONLY CRITICAL RESULT CALLED TO, READ BACK BY AND VERIFIED WITH: PHARMD JESSICA MILLEN [phone] AT 1411 BY EC    Culture (A)  Final    METHICILLIN RESISTANT STAPHYLOCOCCUS AUREUS CULTURE REINCUBATED FOR BETTER GROWTH Performed at [facility], [address], [place], [place] [address]    Report Status PENDING  Incomplete   Organism ID, Bacteria METHICILLIN RESISTANT STAPHYLOCOCCUS AUREUS  Final      Susceptibility   Methicillin resistant staphylococcus aureus - MIC*    CIPROFLOXACIN >=8 RESISTANT Resistant     ERYTHROMYCIN <=0.25 SENSITIVE Sensitive     GENTAMICIN <=0.5 SENSITIVE Sensitive     OXACILLIN >=4 RESISTANT Resistant     TETRACYCLINE <=1 SENSITIVE Sensitive     VANCOMYCIN <=0.5 SENSITIVE Sensitive     TRIMETH/SULFA <=10 SENSITIVE Sensitive     CLINDAMYCIN <=0.25 SENSITIVE Sensitive     RIFAMPIN <=0.5 SENSITIVE Sensitive     Inducible Clindamycin NEGATIVE Sensitive     LINEZOLID 2 SENSITIVE Sensitive     * METHICILLIN RESISTANT STAPHYLOCOCCUS AUREUS  Blood Culture ID Panel (Reflexed)     Status: Abnormal   Collection Time: 12/07/23  8:17 PM  Result Value Ref Range Status   Enterococcus faecalis NOT DETECTED NOT DETECTED Final   Enterococcus Faecium NOT DETECTED NOT DETECTED Final   Listeria monocytogenes NOT DETECTED NOT DETECTED Final   Staphylococcus species DETECTED (A) NOT DETECTED Final    Comment: CRITICAL RESULT CALLED TO, READ BACK BY AND VERIFIED WITH: PHARMD JESSICA MILLEN [phone] AT 1411 BY EC     Staphylococcus aureus (BCID) DETECTED (A) NOT DETECTED Final    Comment: Methicillin (oxacillin)-resistant Staphylococcus aureus (MRSA). MRSA is predictably resistant to beta-lactam antibiotics (except ceftaroline). Preferred therapy is vancomycin unless clinically contraindicated. Patient requires contact precautions if  hospitalized. CRITICAL RESULT CALLED TO, READ BACK BY AND VERIFIED WITH: PHARMD JESSICA MILLEN [id number] AT 1411 BY EC    Staphylococcus epidermidis NOT DETECTED NOT DETECTED Final   Staphylococcus lugdunensis NOT DETECTED NOT DETECTED Final   Streptococcus species NOT DETECTED NOT DETECTED Final   Streptococcus agalactiae NOT DETECTED NOT DETECTED Final   Streptococcus pneumoniae NOT DETECTED NOT DETECTED Final   Streptococcus pyogenes NOT DETECTED NOT DETECTED Final   A.calcoaceticus-baumannii NOT DETECTED NOT DETECTED Final   Bacteroides fragilis NOT DETECTED NOT DETECTED Final   Enterobacterales NOT DETECTED NOT DETECTED Final   Enterobacter cloacae complex NOT DETECTED NOT DETECTED Final   Escherichia coli NOT DETECTED NOT DETECTED Final   Klebsiella aerogenes NOT DETECTED NOT DETECTED Final   Klebsiella oxytoca NOT DETECTED NOT DETECTED Final   Klebsiella pneumoniae NOT DETECTED NOT DETECTED Final   Proteus species NOT DETECTED NOT DETECTED Final   Salmonella species NOT DETECTED NOT DETECTED Final   Serratia marcescens NOT DETECTED NOT DETECTED Final   Haemophilus influenzae NOT DETECTED NOT DETECTED Final   Neisseria meningitidis NOT DETECTED NOT DETECTED Final   Pseudomonas aeruginosa NOT DETECTED NOT DETECTED Final   Stenotrophomonas maltophilia NOT DETECTED NOT DETECTED  Final   Candida albicans NOT DETECTED NOT DETECTED Final   Candida auris NOT DETECTED NOT DETECTED Final   Candida glabrata NOT DETECTED NOT DETECTED Final   Candida krusei NOT DETECTED NOT DETECTED Final   Candida parapsilosis NOT DETECTED NOT DETECTED Final   Candida tropicalis NOT  DETECTED NOT DETECTED Final   Cryptococcus neoformans/gattii NOT DETECTED NOT DETECTED Final   Meth resistant mecA/C and MREJ DETECTED (A) NOT DETECTED Final    Comment: CRITICAL RESULT CALLED TO, READ BACK BY AND VERIFIED WITH: PHARMD JESSICA MILLEN [phone] AT 1411 BY EC Performed at [facility], [address], [place], [place] [address]   Culture, blood (Routine x 2)     Status: None   Collection Time: 12/07/23  8:20 PM   Specimen: BLOOD RIGHT ARM  Result Value Ref Range Status   Specimen Description BLOOD RIGHT ARM  Final   Special Requests   Final    BOTTLES DRAWN AEROBIC AND ANAEROBIC Blood Culture results may not be optimal due to an inadequate volume of blood received in culture bottles   Culture   Final    NO GROWTH 5 DAYS Performed at [facility], [address] [place], [place] [address]    Report Status 12/12/2023 FINAL  Final  Surgical pcr screen     Status: Abnormal   Collection Time: 12/08/23  7:57 PM   Specimen: Nasal Mucosa; Nasal Swab  Result Value Ref Range Status   MRSA, PCR POSITIVE (A) NEGATIVE Final    Comment: RESULT CALLED TO, READ BACK BY AND VERIFIED WITH: T IRBY,RN@2258  12/08/23 MK    Staphylococcus aureus POSITIVE (A) NEGATIVE Final    Comment: (NOTE) The Xpert SA Assay (FDA approved for NASAL specimens in patients 41 years of age and older), is one component of a comprehensive surveillance program. It is not intended to diagnose infection nor to guide or monitor treatment. Performed at [facility], [address], [place], [place] [address]   Culture, blood (Routine X 2) w Reflex to ID Panel     Status: None   Collection Time: 12/09/23  3:07 AM   Specimen: BLOOD RIGHT ARM  Result Value Ref Range Status   Specimen Description BLOOD RIGHT ARM  Final   Special Requests   Final    BOTTLES DRAWN AEROBIC AND ANAEROBIC Blood Culture results may not be optimal due to an inadequate volume of blood received in culture bottles    Culture   Final    NO GROWTH 5 DAYS Performed at [facility], [address], [place], [place] [address]    Report Status 12/14/2023 FINAL  Final  Culture, blood (Routine X 2) w Reflex to ID Panel     Status: None   Collection Time: 12/09/23  3:07 AM   Specimen: BLOOD RIGHT HAND  Result Value Ref Range Status   Specimen Description BLOOD RIGHT HAND  Final   Special Requests   Final    BOTTLES DRAWN AEROBIC AND ANAEROBIC Blood Culture results may not be optimal due to an inadequate volume of blood received in culture bottles   Culture   Final    NO GROWTH 5 DAYS Performed at [facility], [address], [place], [place] [address]    Report Status 12/14/2023 FINAL  Final  Aerobic/Anaerobic Culture w Gram Stain (surgical/deep wound)     Status: None   Collection Time: 12/09/23 10:16 AM   Specimen: Wound  Result  Value Ref Range Status   Specimen Description WOUND  Final   Special Requests RIGHT GROIN  Final   Gram Stain   Final    NO WBC SEEN RARE GRAM POSITIVE COCCI IN PAIRS IN SINGLES    Culture   Final    MODERATE STAPHYLOCOCCUS AUREUS SUSCEPTIBILITIES PERFORMED ON PREVIOUS CULTURE WITHIN THE LAST 5 DAYS. NO ANAEROBES ISOLATED Performed at [facility], [address], [place], [place] [address]    Report Status 12/13/2023 FINAL  Final  Aerobic/Anaerobic Culture w Gram Stain (surgical/deep wound)     Status: None   Collection Time: 12/09/23 10:18 AM   Specimen: Wound  Result Value Ref Range Status   Specimen Description WOUND  Final   Special Requests portion of graft from right groin  Final   Gram Stain   Final    MODERATE WBC PRESENT,BOTH PMN AND MONONUCLEAR FEW GRAM POSITIVE COCCI IN PAIRS    Culture   Final    ABUNDANT METHICILLIN RESISTANT STAPHYLOCOCCUS AUREUS NO ANAEROBES ISOLATED Performed at [facility], [address] [place], [place] [address]    Report Status 12/14/2023 FINAL  Final   Organism ID, Bacteria METHICILLIN RESISTANT  STAPHYLOCOCCUS AUREUS  Final      Susceptibility   Methicillin resistant staphylococcus aureus - MIC*    CIPROFLOXACIN >=8 RESISTANT Resistant     ERYTHROMYCIN <=0.25 SENSITIVE Sensitive     GENTAMICIN <=0.5 SENSITIVE Sensitive     OXACILLIN >=4 RESISTANT Resistant     TETRACYCLINE <=1 SENSITIVE Sensitive     VANCOMYCIN <=0.5 SENSITIVE Sensitive     TRIMETH/SULFA <=10 SENSITIVE Sensitive     CLINDAMYCIN <=0.25 SENSITIVE Sensitive     RIFAMPIN <=0.5 SENSITIVE Sensitive     Inducible Clindamycin NEGATIVE Sensitive     LINEZOLID 2 SENSITIVE Sensitive     * ABUNDANT METHICILLIN RESISTANT STAPHYLOCOCCUS AUREUS  Aerobic/Anaerobic Culture w Gram Stain (surgical/deep wound)     Status: None   Collection Time: 12/09/23 10:23 AM   Specimen: Wound  Result Value Ref Range Status   Specimen Description WOUND  Final   Special Requests DISTAL PORTION OF GRAFT FROM RIGHT GROIN  Final   Gram Stain   Final    RARE WBC PRESENT, PREDOMINANTLY MONONUCLEAR NO ORGANISMS SEEN    Culture   Final    FEW STAPHYLOCOCCUS AUREUS SUSCEPTIBILITIES PERFORMED ON PREVIOUS CULTURE WITHIN THE LAST 5 DAYS. NO ANAEROBES ISOLATED Performed at [facility], [address], [place], [place] [address]    Report Status 12/14/2023 FINAL  Final    Radiology Studies: IR Fluoro Guide CV Line Right Result Date: 12/14/2023 INDICATION: In need of durable intravenous access for antibiotic administration. Chronic renal insufficiency. EXAM: ULTRASOUND AND FLUOROSCOPIC GUIDED PLACEMENT OF TUNNELED CENTRAL VENOUS CATHETER MEDICATIONS: None, the patient is currently admitted to the hospital receiving intravenous antibiotics. The antibiotic was given in an appropriate time interval prior to skin puncture. ANESTHESIA/SEDATION: None FLUOROSCOPY TIME:  18 seconds (1 mGy) COMPLICATIONS: None immediate. PROCEDURE: Informed written consent was obtained from the patient after a discussion of the risks, benefits, and alternatives to  treatment. Questions regarding the procedure were encouraged and answered. The left neck and chest were prepped with chlorhexidine in a sterile fashion, and a sterile drape was applied covering the operative field. Maximum barrier sterile technique with sterile gowns and gloves were used for the procedure. A timeout was performed prior to the initiation of the procedure. After the overlying soft tissues were anesthetized,  a small venotomy incision was created and a micropuncture kit was utilized to access the internal jugular vein. Real-time ultrasound guidance was utilized for vascular access including the acquisition of a permanent ultrasound image documenting patency of the accessed vessel. The microwire was utilized to measure appropriate catheter length. The micropuncture sheath was exchanged for a peel-away sheath over a guidewire. A 5 [place] dual lumen tunneled central venous catheter measuring 24 cm was tunneled in a retrograde fashion from the anterior chest wall to the venotomy incision. The catheter was then placed through the peel-away sheath with tip ultimately positioned at the superior caval-atrial junction. Final catheter positioning was confirmed and documented with a spot radiographic image. The catheter aspirates and flushes normally. The catheter was flushed with appropriate volume heparin dwells. The catheter exit site was secured with a 0-Prolene retention suture. The venotomy incision was closed with Dermabond and Steri-strips. Dressings were applied. The patient tolerated the procedure well without immediate post procedural complication. FINDINGS: After catheter placement, the tip lies within the superior cavoatrial junction. The catheter aspirates and flushes normally and is ready for immediate use. IMPRESSION: Successful placement of 24 cm dual lumen tunneled central venous catheter via the left internal jugular vein with tip terminating at the superior caval atrial junction. The catheter  is ready for immediate use. Electronically Signed   By: Simonne Come M.D.   On: 12/14/2023 15:26   IR US Guide Vasc Access Right Result Date: 12/14/2023 INDICATION: In need of durable intravenous access for antibiotic administration. Chronic renal insufficiency. EXAM: ULTRASOUND AND FLUOROSCOPIC GUIDED PLACEMENT OF TUNNELED CENTRAL VENOUS CATHETER MEDICATIONS: None, the patient is currently admitted to the hospital receiving intravenous antibiotics. The antibiotic was given in an appropriate time interval prior to skin puncture. ANESTHESIA/SEDATION: None FLUOROSCOPY TIME:  18 seconds (1 mGy) COMPLICATIONS: None immediate. PROCEDURE: Informed written consent was obtained from the patient after a discussion of the risks, benefits, and alternatives to treatment. Questions regarding the procedure were encouraged and answered. The left neck and chest were prepped with chlorhexidine in a sterile fashion, and a sterile drape was applied covering the operative field. Maximum barrier sterile technique with sterile gowns and gloves were used for the procedure. A timeout was performed prior to the initiation of the procedure. After the overlying soft tissues were anesthetized, a small venotomy incision was created and a micropuncture kit was utilized to access the internal jugular vein. Real-time ultrasound guidance was utilized for vascular access including the acquisition of a permanent ultrasound image documenting patency of the accessed vessel. The microwire was utilized to measure appropriate catheter length. The micropuncture sheath was exchanged for a peel-away sheath over a guidewire. A 5 [place] dual lumen tunneled central venous catheter measuring 24 cm was tunneled in a retrograde fashion from the anterior chest wall to the venotomy incision. The catheter was then placed through the peel-away sheath with tip ultimately positioned at the superior caval-atrial junction. Final catheter positioning was confirmed and  documented with a spot radiographic image. The catheter aspirates and flushes normally. The catheter was flushed with appropriate volume heparin dwells. The catheter exit site was secured with a 0-Prolene retention suture. The venotomy incision was closed with Dermabond and Steri-strips. Dressings were applied. The patient tolerated the procedure well without immediate post procedural complication. FINDINGS: After catheter placement, the tip lies within the superior cavoatrial junction. The catheter aspirates and flushes normally and is ready for immediate use. IMPRESSION: Successful placement of 24 cm dual lumen tunneled central venous catheter  via the left internal jugular vein with tip terminating at the superior caval atrial junction. The catheter is ready for immediate use. Electronically Signed   By: Simonne Come M.D.   On: 12/14/2023 15:26   VAS [place] UPPER EXTREMITY VENOUS DUPLEX Result Date: 12/14/2023 UPPER VENOUS STUDY  Patient Name:  Jill Higgins  Date of Exam:   12/14/2023 Medical Rec #: [id number]        Accession #:    [id number] Date of Birth: 03/31/1946         Patient Gender: F Patient Age:   64 years Exam Location:  [facility] Procedure:      VAS [place] UPPER EXTREMITY VENOUS DUPLEX Referring Phys: Marguerita Merles --------------------------------------------------------------------------------  Indications: Swelling Risk Factors: None identified. Comparison Study: None. Performing Technologist: Shona Simpson  Examination Guidelines: A complete evaluation includes B-mode imaging, spectral Doppler, color Doppler, and power Doppler as needed of all accessible portions of each vessel. Bilateral testing is considered an integral part of a complete examination. Limited examinations for reoccurring indications may be performed as noted.  Right Findings: +----------+------------+---------+-----------+----------+-------+ RIGHT     CompressiblePhasicitySpontaneousPropertiesSummary  +----------+------------+---------+-----------+----------+-------+ IJV           Full       Yes       Yes                      +----------+------------+---------+-----------+----------+-------+ Subclavian    Full       Yes       Yes                      +----------+------------+---------+-----------+----------+-------+ Axillary      Full       Yes       Yes                      +----------+------------+---------+-----------+----------+-------+ Brachial      Full       Yes       Yes                      +----------+------------+---------+-----------+----------+-------+ Radial        Full       Yes       Yes                      +----------+------------+---------+-----------+----------+-------+ Ulnar         Full       Yes       Yes                      +----------+------------+---------+-----------+----------+-------+ Cephalic      None                 No      dilated   Acute  +----------+------------+---------+-----------+----------+-------+ Basilic       Full                 Yes                      +----------+------------+---------+-----------+----------+-------+  Left Findings: +----------+------------+---------+-----------+----------+-------+ LEFT      CompressiblePhasicitySpontaneousPropertiesSummary +----------+------------+---------+-----------+----------+-------+ Subclavian    Full       Yes       Yes                      +----------+------------+---------+-----------+----------+-------+  Summary:  Right: No evidence of deep vein thrombosis in the upper extremity. Findings consistent with acute superficial vein thrombosis involving the right cephalic vein. Occlusive Acute SVT seen in the Cephalic Vein in the proximal upper arm.  Left: No evidence of thrombosis in the subclavian.  *See table(s) above for measurements and observations.    Preliminary    Scheduled Meds:  aspirin EC  81 mg Oral QHS   atorvastatin  80 mg Oral Daily    Chlorhexidine Gluconate Cloth  6 each Topical Daily   clopidogrel  75 mg Oral Q breakfast   docusate sodium  100 mg Oral Daily   DULoxetine  60 mg Oral Daily   heparin injection (subcutaneous)  5,000 Units Subcutaneous Q8H   insulin aspart  0-9 Units Subcutaneous TID WC   insulin glargine-yfgn  5 Units Subcutaneous Daily   lactose free nutrition  237 mL Oral BID BM   multivitamin with minerals  1 tablet Oral Daily   pantoprazole  40 mg Oral BID   sodium chloride flush  3 mL Intravenous Q12H   Continuous Infusions:  DAPTOmycin 500 mg (12/15/23 1313)   magnesium sulfate bolus IVPB      LOS: 8 days   Marguerita Merles, DO [facility] Available via Epic secure chat 7am-7pm After these hours, please refer to coverage provider listed on [email or web address] 12/15/2023, 5:49 PM

## 2023-12-15 NOTE — Plan of Care (Signed)

## 2023-12-15 NOTE — Progress Notes (Addendum)
  Progress Note    12/15/2023 9:08 AM 2 Days Post-Op  Subjective:  no complaints, in good spirits    Vitals:   12/15/23 0427 12/15/23 0830  BP: 133/65 (!) 121/59  Pulse: 68 75  Resp: 18 14  Temp: 98.2 F (36.8 C) 97.7 F (36.5 C)  SpO2: 100% 100%   Physical Exam: Lungs:  non labored Incisions:  R chest, R groin, L arm c/d/I; R groin vac with good seal Extremities:  palpable R radial; brisk R DP and PT signals Neurologic: A&O  CBC    Component Value Date/Time   WBC 7.7 12/15/2023 0500   RBC 2.85 (L) 12/15/2023 0500   HGB 9.0 (L) 12/15/2023 0500   HCT 26.7 (L) 12/15/2023 0500   PLT 302 12/15/2023 0500   MCV 93.7 12/15/2023 0500   MCH 31.6 12/15/2023 0500   MCHC 33.7 12/15/2023 0500   RDW 13.8 12/15/2023 0500   LYMPHSABS 2.2 12/15/2023 0500   MONOABS 0.5 12/15/2023 0500   EOSABS 0.2 12/15/2023 0500   BASOSABS 0.0 12/15/2023 0500    BMET    Component Value Date/Time   NA 139 12/15/2023 0500   K 4.0 12/15/2023 0500   CL 106 12/15/2023 0500   CO2 24 12/15/2023 0500   GLUCOSE 130 (H) 12/15/2023 0500   BUN 8 12/15/2023 0500   CREATININE 0.68 12/15/2023 0500   CALCIUM 8.1 (L) 12/15/2023 0500   GFRNONAA >60 12/15/2023 0500   GFRAA >60 09/04/2019 0249    INR    Component Value Date/Time   INR 1.3 (H) 12/07/2023 2024     Intake/Output Summary (Last 24 hours) at 12/15/2023 0908 Last data filed at 12/15/2023 0700 Gross per 24 hour  Intake --  Output 1100 ml  Net -1100 ml     Assessment/Plan:  78 y.o. female is s/p  excision of R ax-fem bypass ith vein patch of right axillary artery and vein patch right fem distal bypass on 1/19  R foot well perfused with brisk DP and PT signals R hand well perfused with palpable radial Next vac change change on Monday; TOC arranging HH RN and home vac Tunneled line in place for home IV antibiotics   Daria Pastures MD [facility] Phone Number: [phone] 12/15/2023 9:08  AM

## 2023-12-16 DIAGNOSIS — T827XXA Infection and inflammatory reaction due to other cardiac and vascular devices, implants and grafts, initial encounter: Secondary | ICD-10-CM | POA: Diagnosis not present

## 2023-12-16 DIAGNOSIS — R7881 Bacteremia: Secondary | ICD-10-CM | POA: Diagnosis not present

## 2023-12-16 DIAGNOSIS — K861 Other chronic pancreatitis: Secondary | ICD-10-CM | POA: Diagnosis not present

## 2023-12-16 DIAGNOSIS — F418 Other specified anxiety disorders: Secondary | ICD-10-CM | POA: Diagnosis not present

## 2023-12-16 LAB — CBC WITH DIFFERENTIAL/PLATELET
Abs Immature Granulocytes: 0.11 10*3/uL — ABNORMAL HIGH (ref 0.00–0.07)
Basophils Absolute: 0 10*3/uL (ref 0.0–0.1)
Basophils Relative: 1 %
Eosinophils Absolute: 0.3 10*3/uL (ref 0.0–0.5)
Eosinophils Relative: 3 %
HCT: 30.4 % — ABNORMAL LOW (ref 36.0–46.0)
Hemoglobin: 10.1 g/dL — ABNORMAL LOW (ref 12.0–15.0)
Immature Granulocytes: 1 %
Lymphocytes Relative: 32 %
Lymphs Abs: 2.6 10*3/uL (ref 0.7–4.0)
MCH: 31.9 pg (ref 26.0–34.0)
MCHC: 33.2 g/dL (ref 30.0–36.0)
MCV: 95.9 fL (ref 80.0–100.0)
Monocytes Absolute: 0.4 10*3/uL (ref 0.1–1.0)
Monocytes Relative: 5 %
Neutro Abs: 4.6 10*3/uL (ref 1.7–7.7)
Neutrophils Relative %: 58 %
Platelets: 367 10*3/uL (ref 150–400)
RBC: 3.17 MIL/uL — ABNORMAL LOW (ref 3.87–5.11)
RDW: 13.9 % (ref 11.5–15.5)
WBC: 8.1 10*3/uL (ref 4.0–10.5)
nRBC: 0.4 % — ABNORMAL HIGH (ref 0.0–0.2)

## 2023-12-16 LAB — COMPREHENSIVE METABOLIC PANEL
ALT: 20 U/L (ref 0–44)
AST: 36 U/L (ref 15–41)
Albumin: 2 g/dL — ABNORMAL LOW (ref 3.5–5.0)
Alkaline Phosphatase: 80 U/L (ref 38–126)
Anion gap: 8 (ref 5–15)
BUN: 6 mg/dL — ABNORMAL LOW (ref 8–23)
CO2: 22 mmol/L (ref 22–32)
Calcium: 8 mg/dL — ABNORMAL LOW (ref 8.9–10.3)
Chloride: 109 mmol/L (ref 98–111)
Creatinine, Ser: 0.89 mg/dL (ref 0.44–1.00)
GFR, Estimated: >60 mL/min (ref >60)
Glucose, Bld: 89 mg/dL (ref 70–99)
Potassium: 4.1 mmol/L (ref 3.5–5.1)
Sodium: 139 mmol/L (ref 135–145)
Total Bilirubin: 0.6 mg/dL (ref 0.0–1.2)
Total Protein: 5.1 g/dL — ABNORMAL LOW (ref 6.5–8.1)

## 2023-12-16 LAB — GLUCOSE, CAPILLARY
Glucose-Capillary: 141 mg/dL — ABNORMAL HIGH (ref 70–99)
Glucose-Capillary: 193 mg/dL — ABNORMAL HIGH (ref 70–99)
Glucose-Capillary: 221 mg/dL — ABNORMAL HIGH (ref 70–99)
Glucose-Capillary: 107 mg/dL — ABNORMAL HIGH (ref 70–99)

## 2023-12-16 LAB — PHOSPHORUS: Phosphorus: 4.4 mg/dL (ref 2.5–4.6)

## 2023-12-16 LAB — MAGNESIUM: Magnesium: 2.6 mg/dL — ABNORMAL HIGH (ref 1.7–2.4)

## 2023-12-16 MED ORDER — DAPTOMYCIN IV (FOR PTA / DISCHARGE USE ONLY): 500.0000 mg | INTRAVENOUS | 0 refills | Status: AC

## 2023-12-16 NOTE — Progress Notes (Signed)
Pt ambulated to sink with front wheel walker.  Minimal assist with daily cares/washing.  Declined a walk in the hall following bath, was tired and opted instead to sit up in recliner. Call bell in reach.

## 2023-12-16 NOTE — Progress Notes (Addendum)
PROGRESS NOTE    Jill Higgins  ZOX:[id number] DOB: 07-06-1946 DOA: 12/07/2023 PCP: Merri Brunette, MD   Brief Narrative:  Jill Higgins is a 78 y.o. female with a history of hypertension, diabetes mellitus type 2, depression, anxiety, PAD with thrombosis of right extremity iliac/common femoral and disabling claudication status post axillofemoral bypass graft.  Patient presented secondary to discharge from groin incision with associated chills concerning for evidence of infection.  Patient met sepsis criteria on admission to start empiric antibiotics.  Blood cultures obtained prior to antibiotics.  Vascular surgery consulted with plan for surgical management.   **Interim History She is found to have MRSA bacteremia and ID has been consulted and has been changed to IV Dapto.  She underwent surgical intervention for her right axillofemoral bypass graft infection.  PT OT recommending SNF and was assisting with disposition now that PICC line is in and she appears medically stable.  Assessment and Plan:  Severe Sepsis -Present on admission with associated lactic acidosis.  -Secondary to graft infection.  -Blood cultures (1/17) obtained on admission and are significant for MRSA.  -Repeat CX from 1/19 showing NGTD at 5 Days -Empiric Vancomycin and Cefepime started and transitioned to Vancomycin monotherapy and now Daptomycin.  -ID consulted and appreciate evaluation recommendations and have signed off the case   Right Axillo-Femoral Bypass Graft Infection -Present on admission. Surgery performed on 12/26.  -Vascular surgery consulted and are following.  -WBC Trend had improved but slightly trended up: Recent Labs  Lab 12/10/23 0831 12/11/23 0335 12/12/23 0324 12/13/23 1014 12/14/23 0929 12/15/23 0500 12/16/23 0307  WBC 11.7* 7.9 9.4 12.1* 9.1 7.7 8.1  -Cefepime discontinued. Patient underwent removal of bypass on 1/19; culture significant for GPCs -Vancomycin changed to IV Daptomycin  by ID -Follow-up graft culture data and it shows:  ABUNDANT METHICILLIN RESISTANT STAPHYLOCOCCUS AUREUS NO ANAEROBES ISOLATED; CULTURE IN PROGRESS FOR 5 DAYS   Report Status PENDING  Organism ID, Bacteria METHICILLIN RESISTANT STAPHYLOCOCCUS AUREUS  Resulting Agency [facility]     Susceptibility    Methicillin resistant staphylococcus aureus    MIC    CIPROFLOXACIN >=8 RESISTANT Resistant    CLINDAMYCIN <=0.25 SENS... Sensitive    ERYTHROMYCIN <=0.25 SENS... Sensitive    GENTAMICIN <=0.5 SENSI... Sensitive    Inducible Clindamycin NEGATIVE Sensitive    LINEZOLID 2 SENSITIVE Sensitive    OXACILLIN >=4 RESISTANT Resistant    RIFAMPIN <=0.5 SENSI... Sensitive    TETRACYCLINE <=1 SENSITIVE Sensitive    TRIMETH/SULFA <=10 SENSIT... Sensitive    VANCOMYCIN <=0.5 SENSI... Sensitive     -Right forearm and Hand edematous will check RUE Duplex to Evaluate for DVT -Next VAC changes on Monday and TOC is involved as the PT evaluation has now changed the recommendation for SNF   MRSA Bacteremia -Source is likely right groin wound. ID consulted -Patient was managed on Vancomycin. Repeat blood cultures (1/19) obtained and are no growth to date. Transthoracic Echocardiogram significant for no evidence of valvular vegetations. -ID recommendations: Vancomycin now changed to Daptomycin on 12/11/23 (plan for 4-6 weeks), -Cardiology consulted for Transesophageal Echocardiogram which was recommended by ID and this was done today and showed now LA/LAA Thrombus or masses with EF of 60-65% -Since her blood cultures are negative she required a IJ tunneled PICC line given her anatomy.  This is now in place and she appears medically stable for discharge needs to select a SNF and have insurance authorization  Right Arm Swelling -Upper extremity duplex done and showed an  SVT -U/S formal read showed "No evidence of deep vein thrombosis in the upper extremity. Findings consistent with acute superficial vein  thrombosis involving the right cephalic vein. Occlusive Acute SVT seen in the Cephalic Vein in the proximal upper arm." -C/w Supportive Care   PAD -S/p axillo-femoral bypass graft.  -Patient follows with vascular surgery as an outpatient.  -LDL is down to 15. -Continue Atorvastatin 80 mg po Daily, Aspirin 81 mg po qHS and Clopidogrel 75 mg po Daily    Primary Hypertension -Patient is on Amlodipine, Losartan and Toprol Xl as an outpatient. -Antihypertensives held on admission secondary to hypotension. -Continue to Monitor BP per Protocol -Last BP reading was 136/58   Diabetes Mellitus, Type 2 -Uncontrolled with hyperglycemia.  -Hold home Metformin 500 mg po BID -Last hemoglobin A1C of 9.5%.  -Patient is managed on insulin glargine, insulin aspart, and metformin as an outpatient and started on SSI on admission. Blood sugar low-normal initially, now worsening. -Continue Sensitive Novolog SSI AC and  Semglee 5 units daily -CBG Trend: Recent Labs  Lab 12/15/23 1239 12/15/23 1310 12/15/23 1606 12/15/23 2106 12/15/23 2143 12/16/23 0607 12/16/23 1240  GLUCAP 76 82 128* 51* 100* 141* 193*    Depression and Anxiety -Continue Duloxetine 60 mg po Daily  Metabolic Acidosis -Mild and improved. CO2 is now 24, AG is 8 , Chloride Level is now 106 -Continue to Monitor and Trend and repeat CMP in the AM   Insomnia -Continue Temazepam 30 mg po qHSprn Sleep  Hypokalemia -Patient's K+ Level Trend: Recent Labs  Lab 12/10/23 0355 12/11/23 0335 12/12/23 0324 12/13/23 1014 12/14/23 0929 12/15/23 0500 12/16/23 0307  K 3.7 3.2* 3.5 4.0 3.8 4.0 4.1  -Continue to Monitor and Replete as Necessary -Repeat CMP in the AM   Hypomagnesemia -Patient's Mag Level Trend: Recent Labs  Lab 12/08/23 0529 12/10/23 0355 12/12/23 0324 12/13/23 1014 12/14/23 0929 12/15/23 0500 12/16/23 0307  MG 1.0* 2.0 1.5* 1.6* 1.8 1.5* 2.6*  -Continue to Monitor and Replete as Necessary -Repeat Mag in the  AM   ABLA and Perioperative Blood loss superimposed on Normocytic Anemia, now stable -Secondary to vascular surgery. Patient with hemoglobin down to 7.1 g/dL requiring a transfusion of 2 units of PRBC. Posttransfusion hemoglobin now stable -Hgb/Hct Trend: Recent Labs  Lab 12/10/23 0831 12/11/23 0335 12/12/23 0324 12/13/23 1014 12/14/23 0929 12/15/23 0500 12/16/23 0307  HGB 10.0* 8.6* 8.2* 10.1* 9.8* 9.0* 10.1*  HCT 28.4* 24.6* 24.4* 30.3* 29.3* 26.7* 30.4*  MCV 91.6 93.2 94.2 95.6 94.5 93.7 95.9  -Continue to Monitor for S/Sx of Bleeding; No overt bleeding noted -Repeat CBC in the AM   Hypoalbuminemia -Patient's Albumin Trend: Recent Labs  Lab 12/09/23 0307 12/10/23 0355 12/11/23 0335 12/13/23 1014 12/14/23 0929 12/15/23 0500 12/16/23 0307  ALBUMIN 1.7* 1.8* 1.7* 1.8* 1.9* 1.8* 2.0*  -Continue to Monitor and Trend and repeat CMP in the AM   Severe Protein Calorie Malnutrition in the context of Chronic illness Nutrition Status: Nutrition Problem: Severe Malnutrition Etiology: chronic illness (chronic pancreatitits) Signs/Symptoms: severe fat depletion, severe muscle depletion, energy intake < 75% for > or equal to 1 month Interventions: Boost Plus, Magic cup, MVI     DVT prophylaxis: heparin injection 5,000 Units Start: 12/12/23 0800 SCD's Start: 12/09/23 1835 SCDs Start: 12/07/23 2343    Code Status: Full Code Family Communication: Discussed with family present at bedside  Disposition Plan:  Level of care: Progressive Status is: Inpatient Remains inpatient appropriate because: Medically stable for SNF  Consultants:  Vascular Surgery Infectious Diseases  Cardiology for TEE  Procedures:  As delineated as above   Procedure by Dr. Chestine Spore: 1.  Harvest of left arm basilic vein 2.  Redo exposure of right axillary artery 3.  Excision of right axillary artery to femoral artery bypass 4.  Vein patch angioplasty right axillary artery 5.  Vein patch angioplasty  of right femoral artery distal bypass 6.  Vac right groin  Antimicrobials:  Anti-infectives (From admission, onward)    Start     Dose/Rate Route Frequency Ordered Stop   12/16/23 0000  daptomycin (CUBICIN) IVPB        500 mg Intravenous Every 24 hours 12/16/23 2118 01/01/24 2359   12/11/23 1400  DAPTOmycin (CUBICIN) IVPB 500 mg/64mL premix        500 mg 100 mL/hr over 30 Minutes Intravenous Daily 12/11/23 0836     12/10/23 1400  DAPTOmycin (CUBICIN) 400 mg in sodium chloride 0.9 % IVPB  Status:  Discontinued        8 mg/kg  50 kg 116 mL/hr over 30 Minutes Intravenous Daily 12/10/23 1111 12/11/23 0836   12/09/23 2100  vancomycin (VANCOCIN) 750 mg in sodium chloride 0.9 % 250 mL IVPB  Status:  Discontinued        750 mg 265 mL/hr over 60 Minutes Intravenous Every 24 hours 12/09/23 1759 12/10/23 1111   12/09/23 0800  vancomycin (VANCOCIN) IVPB 1000 mg/200 mL premix  Status:  Discontinued        1,000 mg 200 mL/hr over 60 Minutes Intravenous  Once 12/09/23 0746 12/09/23 0807   12/09/23 0748  vancomycin (VANCOCIN) 1-5 GM/200ML-% IVPB       Note to Pharmacy: Aquilla Hacker M: cabinet override      12/09/23 0748 12/09/23 1959   12/08/23 2100  vancomycin (VANCOCIN) 750 mg in sodium chloride 0.9 % 250 mL IVPB  Status:  Discontinued        750 mg 250 mL/hr over 60 Minutes Intravenous Every 24 hours 12/07/23 2359 12/09/23 1759   12/07/23 2100  vancomycin (VANCOCIN) IVPB 1000 mg/200 mL premix        1,000 mg 200 mL/hr over 60 Minutes Intravenous  Once 12/07/23 2048 12/07/23 2313   12/07/23 2100  ceFEPIme (MAXIPIME) 2 g in sodium chloride 0.9 % 100 mL IVPB  Status:  Discontinued        2 g 200 mL/hr over 30 Minutes Intravenous Every 12 hours 12/07/23 2053 12/08/23 1519       Subjective: Seen and examined at bedside this AM and was complaining of some Arm pain. No CP or SOB. Feels fairly well. No lightheadedness or dizziness. No other concerns or complaints at this time.    Objective: Vitals:   12/16/23 0354 12/16/23 0749 12/16/23 1742 12/16/23 1931  BP: 115/75 (!) 136/58 124/71 102/60  Pulse: 68 65 65 64  Resp: 16 12 13 15   Temp: (!) 97.5 F (36.4 C) (!) 97.3 F (36.3 C) 97.7 F (36.5 C) 97.6 F (36.4 C)  TempSrc: Oral Axillary Oral Oral  SpO2: 100% 98% 98% 97%  Weight:      Height:        Intake/Output Summary (Last 24 hours) at 12/16/2023 2120 Last data filed at 12/16/2023 0359 Gross per 24 hour  Intake 100 ml  Output 700 ml  Net -600 ml   Filed Weights   12/07/23 2006 12/09/23 0648  Weight: 50 kg 50 kg   Examination: Physical Exam:  Constitutional: Thin  chronically ill-appearing female in NAD appears calm Respiratory: Diminished to auscultation bilaterally, no wheezing, rales, rhonchi or crackles. Normal respiratory effort and patient is not tachypenic. No accessory muscle use. Unlabored breathing Cardiovascular: RRR, no murmurs / rubs / gallops. S1 and S2 auscultated. Trace extremity edema.  Abdomen: Soft, non-tender, non-distended. Bowel sounds positive.  GU: Deferred. Musculoskeletal: Left internal jugular PICC noted. Has some Right Arm swelling  Neurologic: CN 2-12 grossly intact with no focal deficits.  Romberg sign and cerebellar reflexes not assessed.  Psychiatric: Normal judgment and insight. Alert and oriented x 3. Normal mood and appropriate affect.   Data Reviewed: I have personally reviewed following labs and imaging studies  CBC: Recent Labs  Lab 12/12/23 0324 12/13/23 1014 12/14/23 0929 12/15/23 0500 12/16/23 0307  WBC 9.4 12.1* 9.1 7.7 8.1  NEUTROABS  --  8.8* 5.3 4.7 4.6  HGB 8.2* 10.1* 9.8* 9.0* 10.1*  HCT 24.4* 30.3* 29.3* 26.7* 30.4*  MCV 94.2 95.6 94.5 93.7 95.9  PLT 148* 227 301 302 367   Basic Metabolic Panel: Recent Labs  Lab 12/12/23 0324 12/13/23 1014 12/14/23 0929 12/15/23 0500 12/16/23 0307  NA 135 136 138 139 139  K 3.5 4.0 3.8 4.0 4.1  CL 107 105 106 106 109  CO2 23 21* 24 24 22    GLUCOSE 105* 177* 126* 130* 89  BUN 9 13 8 8  6*  CREATININE 0.55 0.53 0.52 0.68 0.89  CALCIUM 7.2* 7.9* 8.1* 8.1* 8.0*  MG 1.5* 1.6* 1.8 1.5* 2.6*  PHOS  --  3.3 3.5 3.8 4.4   GFR: Estimated Creatinine Clearance: 41.1 mL/min (by C-G formula based on SCr of 0.89 mg/dL). Liver Function Tests: Recent Labs  Lab 12/11/23 0335 12/13/23 1014 12/14/23 0929 12/15/23 0500 12/16/23 0307  AST 23 25 18 27  36  ALT 17 19 17 17 20   ALKPHOS 45 70 73 77 80  BILITOT 0.6 0.5 0.4 0.6 0.6  PROT 3.8* 4.6* 5.0* 4.8* 5.1*  ALBUMIN 1.7* 1.8* 1.9* 1.8* 2.0*   No results for input(s): "LIPASE", "AMYLASE" in the last 168 hours. No results for input(s): "AMMONIA" in the last 168 hours. Coagulation Profile: No results for input(s): "INR", "PROTIME" in the last 168 hours. Cardiac Enzymes: Recent Labs  Lab 12/11/23 0335 12/13/23 1014  CKTOTAL 114 44   BNP (last 3 results) No results for input(s): "PROBNP" in the last 8760 hours. HbA1C: No results for input(s): "HGBA1C" in the last 72 hours. CBG: Recent Labs  Lab 12/15/23 2143 12/16/23 0607 12/16/23 1240 12/16/23 1619 12/16/23 2043  GLUCAP 100* 141* 193* 221* 107*   Lipid Profile: No results for input(s): "CHOL", "HDL", "LDLCALC", "TRIG", "CHOLHDL", "LDLDIRECT" in the last 72 hours. Thyroid Function Tests: No results for input(s): "TSH", "T4TOTAL", "FREET4", "T3FREE", "THYROIDAB" in the last 72 hours. Anemia Panel: No results for input(s): "VITAMINB12", "FOLATE", "FERRITIN", "TIBC", "IRON", "RETICCTPCT" in the last 72 hours. Sepsis Labs: No results for input(s): "PROCALCITON", "LATICACIDVEN" in the last 168 hours.  Recent Results (from the past 240 hours)  MIC (1 Drug)-Blood Culture; 12/07/2023; BLOOD; Staph aureus; Daptomycin     Status: None   Collection Time: 12/07/23 11:55 AM   Specimen: BLOOD  Result Value Ref Range Status   Min Inhibitory Conc (1 Drug) Preliminary report  Final    Comment: (NOTE) Performed At: [facility] [address] [place], [place] [id number] Jolene Schimke MD ON:[id number]    Source BLOOD  Final    Comment: Performed at [facility], [address] [place], [place] [address]  Culture, blood (Routine x 2)     Status: Abnormal (Preliminary result)   Collection Time: 12/07/23  8:17 PM   Specimen: BLOOD  Result Value Ref Range Status   Specimen Description BLOOD LEFT ANTECUBITAL  Final   Special Requests   Final    BOTTLES DRAWN AEROBIC AND ANAEROBIC Blood Culture results may not be optimal due to an inadequate volume of blood received in culture bottles   Culture  Setup Time   Final    GRAM POSITIVE COCCI ANAEROBIC BOTTLE ONLY CRITICAL RESULT CALLED TO, READ BACK BY AND VERIFIED WITH: PHARMD JESSICA MILLEN [id number] AT 1411 BY EC    Culture (A)  Final    METHICILLIN RESISTANT STAPHYLOCOCCUS AUREUS CULTURE REINCUBATED FOR BETTER GROWTH Performed at [facility], [address], [place], [place] [address]    Report Status PENDING  Incomplete   Organism ID, Bacteria METHICILLIN RESISTANT STAPHYLOCOCCUS AUREUS  Final      Susceptibility   Methicillin resistant staphylococcus aureus - MIC*    CIPROFLOXACIN >=8 RESISTANT Resistant     ERYTHROMYCIN <=0.25 SENSITIVE Sensitive     GENTAMICIN <=0.5 SENSITIVE Sensitive     OXACILLIN >=4 RESISTANT Resistant     TETRACYCLINE <=1 SENSITIVE Sensitive     VANCOMYCIN <=0.5 SENSITIVE Sensitive     TRIMETH/SULFA <=10 SENSITIVE Sensitive     CLINDAMYCIN <=0.25 SENSITIVE Sensitive     RIFAMPIN <=0.5 SENSITIVE Sensitive     Inducible Clindamycin NEGATIVE Sensitive     LINEZOLID 2 SENSITIVE Sensitive     * METHICILLIN RESISTANT STAPHYLOCOCCUS AUREUS  Blood Culture ID Panel (Reflexed)     Status: Abnormal   Collection Time: 12/07/23  8:17 PM  Result Value Ref Range Status   Enterococcus faecalis NOT DETECTED NOT DETECTED Final   Enterococcus Faecium NOT DETECTED NOT DETECTED Final   Listeria monocytogenes NOT DETECTED NOT  DETECTED Final   Staphylococcus species DETECTED (A) NOT DETECTED Final    Comment: CRITICAL RESULT CALLED TO, READ BACK BY AND VERIFIED WITH: PHARMD JESSICA MILLEN [id number] AT 1411 BY EC    Staphylococcus aureus (BCID) DETECTED (A) NOT DETECTED Final    Comment: Methicillin (oxacillin)-resistant Staphylococcus aureus (MRSA). MRSA is predictably resistant to beta-lactam antibiotics (except ceftaroline). Preferred therapy is vancomycin unless clinically contraindicated. Patient requires contact precautions if  hospitalized. CRITICAL RESULT CALLED TO, READ BACK BY AND VERIFIED WITH: PHARMD JESSICA MILLEN [id number] AT 1411 BY EC    Staphylococcus epidermidis NOT DETECTED NOT DETECTED Final   Staphylococcus lugdunensis NOT DETECTED NOT DETECTED Final   Streptococcus species NOT DETECTED NOT DETECTED Final   Streptococcus agalactiae NOT DETECTED NOT DETECTED Final   Streptococcus pneumoniae NOT DETECTED NOT DETECTED Final   Streptococcus pyogenes NOT DETECTED NOT DETECTED Final   A.calcoaceticus-baumannii NOT DETECTED NOT DETECTED Final   Bacteroides fragilis NOT DETECTED NOT DETECTED Final   Enterobacterales NOT DETECTED NOT DETECTED Final   Enterobacter cloacae complex NOT DETECTED NOT DETECTED Final   Escherichia coli NOT DETECTED NOT DETECTED Final   Klebsiella aerogenes NOT DETECTED NOT DETECTED Final   Klebsiella oxytoca NOT DETECTED NOT DETECTED Final   Klebsiella pneumoniae NOT DETECTED NOT DETECTED Final   Proteus species NOT DETECTED NOT DETECTED Final   Salmonella species NOT DETECTED NOT DETECTED Final   Serratia marcescens NOT DETECTED NOT DETECTED Final   Haemophilus influenzae NOT DETECTED NOT DETECTED Final   Neisseria meningitidis NOT DETECTED NOT DETECTED Final   Pseudomonas aeruginosa NOT DETECTED  NOT DETECTED Final   Stenotrophomonas maltophilia NOT DETECTED NOT DETECTED Final   Candida albicans NOT DETECTED NOT DETECTED Final   Candida auris NOT DETECTED NOT DETECTED  Final   Candida glabrata NOT DETECTED NOT DETECTED Final   Candida krusei NOT DETECTED NOT DETECTED Final   Candida parapsilosis NOT DETECTED NOT DETECTED Final   Candida tropicalis NOT DETECTED NOT DETECTED Final   Cryptococcus neoformans/gattii NOT DETECTED NOT DETECTED Final   Meth resistant mecA/C and MREJ DETECTED (A) NOT DETECTED Final    Comment: CRITICAL RESULT CALLED TO, READ BACK BY AND VERIFIED WITH: PHARMD JESSICA MILLEN [id number] AT 1411 BY EC Performed at [facility], [address], [place], [place] [address]   Culture, blood (Routine x 2)     Status: None   Collection Time: 12/07/23  8:20 PM   Specimen: BLOOD RIGHT ARM  Result Value Ref Range Status   Specimen Description BLOOD RIGHT ARM  Final   Special Requests   Final    BOTTLES DRAWN AEROBIC AND ANAEROBIC Blood Culture results may not be optimal due to an inadequate volume of blood received in culture bottles   Culture   Final    NO GROWTH 5 DAYS Performed at [facility], [address], [place], [place] [address]    Report Status 12/12/2023 FINAL  Final  Surgical pcr screen     Status: Abnormal   Collection Time: 12/08/23  7:57 PM   Specimen: Nasal Mucosa; Nasal Swab  Result Value Ref Range Status   MRSA, PCR POSITIVE (A) NEGATIVE Final    Comment: RESULT CALLED TO, READ BACK BY AND VERIFIED WITH: T IRBY,RN@2258  12/08/23 MK    Staphylococcus aureus POSITIVE (A) NEGATIVE Final    Comment: (NOTE) The Xpert SA Assay (FDA approved for NASAL specimens in patients 26 years of age and older), is one component of a comprehensive surveillance program. It is not intended to diagnose infection nor to guide or monitor treatment. Performed at [facility], [address], [place], [place] [address]   Culture, blood (Routine X 2) w Reflex to ID Panel     Status: None   Collection Time: 12/09/23  3:07 AM   Specimen: BLOOD RIGHT ARM  Result Value Ref Range Status   Specimen Description BLOOD RIGHT  ARM  Final   Special Requests   Final    BOTTLES DRAWN AEROBIC AND ANAEROBIC Blood Culture results may not be optimal due to an inadequate volume of blood received in culture bottles   Culture   Final    NO GROWTH 5 DAYS Performed at [facility], [address] [place], [place] [address]    Report Status 12/14/2023 FINAL  Final  Culture, blood (Routine X 2) w Reflex to ID Panel     Status: None   Collection Time: 12/09/23  3:07 AM   Specimen: BLOOD RIGHT HAND  Result Value Ref Range Status   Specimen Description BLOOD RIGHT HAND  Final   Special Requests   Final    BOTTLES DRAWN AEROBIC AND ANAEROBIC Blood Culture results may not be optimal due to an inadequate volume of blood received in culture bottles   Culture   Final    NO GROWTH 5 DAYS Performed at [facility], [address] [place], [place] [address]    Report Status 12/14/2023 FINAL  Final  Aerobic/Anaerobic Culture w Gram Stain (surgical/deep wound)     Status: None  Collection Time: 12/09/23 10:16 AM   Specimen: Wound  Result Value Ref Range Status   Specimen Description WOUND  Final   Special Requests RIGHT GROIN  Final   Gram Stain   Final    NO WBC SEEN RARE GRAM POSITIVE COCCI IN PAIRS IN SINGLES    Culture   Final    MODERATE STAPHYLOCOCCUS AUREUS SUSCEPTIBILITIES PERFORMED ON PREVIOUS CULTURE WITHIN THE LAST 5 DAYS. NO ANAEROBES ISOLATED Performed at [facility], [address] [place], [place] [address]    Report Status 12/13/2023 FINAL  Final  Aerobic/Anaerobic Culture w Gram Stain (surgical/deep wound)     Status: None   Collection Time: 12/09/23 10:18 AM   Specimen: Wound  Result Value Ref Range Status   Specimen Description WOUND  Final   Special Requests portion of graft from right groin  Final   Gram Stain   Final    MODERATE WBC PRESENT,BOTH PMN AND MONONUCLEAR FEW GRAM POSITIVE COCCI IN PAIRS    Culture   Final    ABUNDANT METHICILLIN RESISTANT STAPHYLOCOCCUS  AUREUS NO ANAEROBES ISOLATED Performed at [facility], [address] [place], [place] [address]    Report Status 12/14/2023 FINAL  Final   Organism ID, Bacteria METHICILLIN RESISTANT STAPHYLOCOCCUS AUREUS  Final      Susceptibility   Methicillin resistant staphylococcus aureus - MIC*    CIPROFLOXACIN >=8 RESISTANT Resistant     ERYTHROMYCIN <=0.25 SENSITIVE Sensitive     GENTAMICIN <=0.5 SENSITIVE Sensitive     OXACILLIN >=4 RESISTANT Resistant     TETRACYCLINE <=1 SENSITIVE Sensitive     VANCOMYCIN <=0.5 SENSITIVE Sensitive     TRIMETH/SULFA <=10 SENSITIVE Sensitive     CLINDAMYCIN <=0.25 SENSITIVE Sensitive     RIFAMPIN <=0.5 SENSITIVE Sensitive     Inducible Clindamycin NEGATIVE Sensitive     LINEZOLID 2 SENSITIVE Sensitive     * ABUNDANT METHICILLIN RESISTANT STAPHYLOCOCCUS AUREUS  Aerobic/Anaerobic Culture w Gram Stain (surgical/deep wound)     Status: None   Collection Time: 12/09/23 10:23 AM   Specimen: Wound  Result Value Ref Range Status   Specimen Description WOUND  Final   Special Requests DISTAL PORTION OF GRAFT FROM RIGHT GROIN  Final   Gram Stain   Final    RARE WBC PRESENT, PREDOMINANTLY MONONUCLEAR NO ORGANISMS SEEN    Culture   Final    FEW STAPHYLOCOCCUS AUREUS SUSCEPTIBILITIES PERFORMED ON PREVIOUS CULTURE WITHIN THE LAST 5 DAYS. NO ANAEROBES ISOLATED Performed at [facility], [address] [place], [place] [address]    Report Status 12/14/2023 FINAL  Final    Radiology Studies: No results found.  Scheduled Meds:  aspirin EC  81 mg Oral QHS   atorvastatin  80 mg Oral Daily   Chlorhexidine Gluconate Cloth  6 each Topical Daily   clopidogrel  75 mg Oral Q breakfast   docusate sodium  100 mg Oral Daily   DULoxetine  60 mg Oral Daily   heparin injection (subcutaneous)  5,000 Units Subcutaneous Q8H   insulin aspart  0-9 Units Subcutaneous TID WC   insulin glargine-yfgn  3 Units Subcutaneous Daily   lactose free nutrition  237 mL Oral  BID BM   multivitamin with minerals  1 tablet Oral Daily   pantoprazole  40 mg Oral BID   sodium chloride flush  3 mL Intravenous Q12H   Continuous Infusions:  DAPTOmycin 500 mg (12/16/23 1553)   magnesium sulfate bolus IVPB  LOS: 9 days   Marguerita Merles, DO [facility] Available via Epic secure chat 7am-7pm After these hours, please refer to coverage provider listed on [email or web address] 12/16/2023, 9:20 PM

## 2023-12-16 NOTE — TOC Progression Note (Signed)
Transition of Care [facility]) - Progression Note    Patient Details  Name: Jill Higgins MRN: [id number] Date of Birth: 12/28/1945  Transition of Care [facility]) CM/SW Contact  Patrice Paradise, LCSW Phone Number: 12/16/2023, 1:17 PM  Clinical Narrative:     CSW was asked about the status of pt's SNF's placement. CSW explained that pt has not agreed fully to SNF. RN confirmed that pt is now agreeable to SNF. CSW attempted to speak with pt and pt asked for CSW to call daughter [name] about SNF placements.  CSW called Vickie and provided her with the 3 bed offers from [facility], [address] and [facility].Vickie asked about [facility] and CSW informed Vickie that a referral had not been sent there however could be sent out. [facility] explained that [facility] preferred [facility].  CSW start pt's authorization and facility would need to be given. Reference # [id number].  CSW attempted to [facility] to have them to look at the referral however had to leave a VM.  TOC team will continue to assist with discharge planning needs.    Expected Discharge Plan: Skilled Nursing Facility Barriers to Discharge: Continued Medical Work up, SNF Pending bed offer, English as a second language teacher  Expected Discharge Plan and Services In-house Referral: Clinical Social Work Discharge Planning Services: CM Consult   Living arrangements for the past 2 months: Apartment                 DME Arranged: Vac DME Agency: [employer] Date DME Agency Contacted: 12/12/23 Time DME Agency Contacted: [phone] Representative spoke with at DME Agency: French Ana HH Arranged: RN, PT, OT [facility] Agency: [facility] Date HH Agency Contacted: 12/12/23 Time HH Agency Contacted: [phone] Representative spoke with at [facility] Agency: Aggie Cosier   Social Determinants of Health (SDOH) Interventions SDOH Screenings   Food Insecurity: No Food Insecurity (12/11/2023)  Housing: Low Risk  (12/11/2023)  Transportation Needs: No Transportation Needs  (12/11/2023)  Utilities: Not At Risk (12/11/2023)  Depression (PHQ2-9): Low Risk  (11/11/2020)  Social Connections: Moderately Isolated (12/11/2023)  Tobacco Use: Medium Risk (12/13/2023)    Readmission Risk Interventions    11/20/2023    3:47 PM  Readmission Risk Prevention Plan  Post Dischage Appt Complete  Medication Screening Complete  Transportation Screening Complete

## 2023-12-16 NOTE — Progress Notes (Addendum)
  Progress Note    12/16/2023 9:11 AM 3 Days Post-Op  Subjective:  no complaints   Vitals:   12/16/23 0354 12/16/23 0749  BP: 115/75 (!) 136/58  Pulse: 68 65  Resp: 16 12  Temp: (!) 97.5 F (36.4 C) (!) 97.3 F (36.3 C)  SpO2: 100% 98%   Physical Exam: Lungs:  non labored Incisions:  r chest, L arm healing well; wound vac with good seal R groin Extremities:  brisk R DP by doppler; L DP palpable; radials palpable Abdomen:  soft, NT, ND Neurologic: A&O  CBC    Component Value Date/Time   WBC 8.1 12/16/2023 0307   RBC 3.17 (L) 12/16/2023 0307   HGB 10.1 (L) 12/16/2023 0307   HCT 30.4 (L) 12/16/2023 0307   PLT 367 12/16/2023 0307   MCV 95.9 12/16/2023 0307   MCH 31.9 12/16/2023 0307   MCHC 33.2 12/16/2023 0307   RDW 13.9 12/16/2023 0307   LYMPHSABS 2.6 12/16/2023 0307   MONOABS 0.4 12/16/2023 0307   EOSABS 0.3 12/16/2023 0307   BASOSABS 0.0 12/16/2023 0307    BMET    Component Value Date/Time   NA 139 12/16/2023 0307   K 4.1 12/16/2023 0307   CL 109 12/16/2023 0307   CO2 22 12/16/2023 0307   GLUCOSE 89 12/16/2023 0307   BUN 6 (L) 12/16/2023 0307   CREATININE 0.89 12/16/2023 0307   CALCIUM 8.0 (L) 12/16/2023 0307   GFRNONAA >60 12/16/2023 0307   GFRAA >60 09/04/2019 0249    INR    Component Value Date/Time   INR 1.3 (H) 12/07/2023 2024     Intake/Output Summary (Last 24 hours) at 12/16/2023 0911 Last data filed at 12/16/2023 0359 Gross per 24 hour  Intake 100 ml  Output 1150 ml  Net -1050 ml     Assessment/Plan:  78 y.o. female is s/p excision of R ax-fem bypass 3 Days Post-Op   R foot well perfused by doppler; without rest pain R hand well perfused with palpable radial; chest incision healing well R groin vac with good seal; vac change tomorrow with WOC Ok for discharge from vascular standpoint when SNF approved   Emilie Rutter, PA-C [facility] [phone] 12/16/2023 9:11 AM  VASCULAR STAFF ADDENDUM: I have  independently interviewed and examined the patient. I agree with the above.  DP and PT doppler signal on R Palpable R radial Incisions healing well. Vac on suction Stable for DC from vascular perspective  Daria Pastures MD [facility] Phone Number: [phone] 12/16/2023 9:39 AM

## 2023-12-16 NOTE — Progress Notes (Signed)
Pt does not want to walk at this time, playoff game on.

## 2023-12-16 NOTE — Progress Notes (Signed)
Have had some ongoing issues with wound vac alarming blockage throughout last night and today. Lily pad was changed last night. Alarm back on this afternoon, lily pad changed again, this time made hole to dressing a bit larger.  Seems to be getting better suction at this time. WOC to change dressing tomorrow.

## 2023-12-17 DIAGNOSIS — R7881 Bacteremia: Secondary | ICD-10-CM | POA: Diagnosis not present

## 2023-12-17 DIAGNOSIS — K861 Other chronic pancreatitis: Secondary | ICD-10-CM | POA: Diagnosis not present

## 2023-12-17 DIAGNOSIS — F418 Other specified anxiety disorders: Secondary | ICD-10-CM | POA: Diagnosis not present

## 2023-12-17 DIAGNOSIS — T827XXA Infection and inflammatory reaction due to other cardiac and vascular devices, implants and grafts, initial encounter: Secondary | ICD-10-CM | POA: Diagnosis not present

## 2023-12-17 LAB — CBC WITH DIFFERENTIAL/PLATELET
Abs Immature Granulocytes: 0 10*3/uL (ref 0.00–0.07)
Basophils Absolute: 0 10*3/uL (ref 0.0–0.1)
Basophils Relative: 0 %
Eosinophils Absolute: 0.3 10*3/uL (ref 0.0–0.5)
Eosinophils Relative: 4 %
HCT: 26.6 % — ABNORMAL LOW (ref 36.0–46.0)
Hemoglobin: 8.7 g/dL — ABNORMAL LOW (ref 12.0–15.0)
Lymphocytes Relative: 23 %
Lymphs Abs: 1.5 10*3/uL (ref 0.7–4.0)
MCH: 31.6 pg (ref 26.0–34.0)
MCHC: 32.7 g/dL (ref 30.0–36.0)
MCV: 96.7 fL (ref 80.0–100.0)
Monocytes Absolute: 0 10*3/uL — ABNORMAL LOW (ref 0.1–1.0)
Monocytes Relative: 0 %
Neutro Abs: 4.8 10*3/uL (ref 1.7–7.7)
Neutrophils Relative %: 73 %
Platelets: 368 10*3/uL (ref 150–400)
RBC: 2.75 MIL/uL — ABNORMAL LOW (ref 3.87–5.11)
RDW: 14.1 % (ref 11.5–15.5)
WBC: 6.6 10*3/uL (ref 4.0–10.5)
nRBC: 0 % (ref 0.0–0.2)
nRBC: 0 /100{WBCs}

## 2023-12-17 LAB — COMPREHENSIVE METABOLIC PANEL
ALT: 17 U/L (ref 0–44)
AST: 21 U/L (ref 15–41)
Albumin: 1.7 g/dL — ABNORMAL LOW (ref 3.5–5.0)
Alkaline Phosphatase: 74 U/L (ref 38–126)
Anion gap: 6 (ref 5–15)
BUN: 7 mg/dL — ABNORMAL LOW (ref 8–23)
CO2: 23 mmol/L (ref 22–32)
Calcium: 7.6 mg/dL — ABNORMAL LOW (ref 8.9–10.3)
Chloride: 104 mmol/L (ref 98–111)
Creatinine, Ser: 0.67 mg/dL (ref 0.44–1.00)
GFR, Estimated: >60 mL/min (ref >60)
Glucose, Bld: 212 mg/dL — ABNORMAL HIGH (ref 70–99)
Potassium: 3.6 mmol/L (ref 3.5–5.1)
Sodium: 133 mmol/L — ABNORMAL LOW (ref 135–145)
Total Bilirubin: 0.4 mg/dL (ref 0.0–1.2)
Total Protein: 4.8 g/dL — ABNORMAL LOW (ref 6.5–8.1)

## 2023-12-17 LAB — PHOSPHORUS: Phosphorus: 3.4 mg/dL (ref 2.5–4.6)

## 2023-12-17 LAB — GLUCOSE, CAPILLARY
Glucose-Capillary: 159 mg/dL — ABNORMAL HIGH (ref 70–99)
Glucose-Capillary: 132 mg/dL — ABNORMAL HIGH (ref 70–99)
Glucose-Capillary: 194 mg/dL — ABNORMAL HIGH (ref 70–99)
Glucose-Capillary: 158 mg/dL — ABNORMAL HIGH (ref 70–99)

## 2023-12-17 LAB — MAGNESIUM: Magnesium: 1.8 mg/dL (ref 1.7–2.4)

## 2023-12-17 LAB — MINIMUM INHIBITORY CONC. (1 DRUG)

## 2023-12-17 LAB — MIC RESULT

## 2023-12-17 MED ORDER — HYDROMORPHONE HCL 1 MG/ML IJ SOLN
0.5000 mg | INTRAMUSCULAR | Status: DC | PRN
  Administered 2023-12-17 – 2023-12-19 (×6): 0.5 mg via INTRAVENOUS
  Filled 2023-12-17 (×6): qty 0.5

## 2023-12-17 MED ORDER — MAGNESIUM SULFATE 2 GM/50ML IV SOLN
2.0000 g | Freq: Once | INTRAVENOUS | Status: AC
  Administered 2023-12-17: 2 g via INTRAVENOUS
  Filled 2023-12-17: qty 50

## 2023-12-17 NOTE — Care Management Important Message (Signed)
Important Message  Patient Details  Name: Jill Higgins MRN: [id number] Date of Birth: [birth year]   Important Message Given:  Yes - Medicare IM     Renie Ora 12/17/2023, 10:33 AM

## 2023-12-17 NOTE — TOC Progression Note (Signed)
Transition of Care [facility]) - Progression Note    Patient Details  Name: MOREEN PIGGOTT MRN: [id number] Date of Birth: Nov 10, 1946  Transition of Care [facility]) CM/SW Contact  Eduard Roux, [place] Phone Number: 12/17/2023, 11:58 AM  Clinical Narrative:     [facility]- requested they review referral and patient would need wound vac. - waiting on response from admission  Antony Blackbird, MSW, LCSW Clinical Social Worker    Expected Discharge Plan: Skilled Nursing Facility Barriers to Discharge: Continued Medical Work up, SNF Pending bed offer, English as a second language teacher  Expected Discharge Plan and Services In-house Referral: Clinical Social Work Discharge Planning Services: CM Consult   Living arrangements for the past 2 months: Apartment                 DME Arranged: Vac DME Agency: [employer] Date DME Agency Contacted: 12/12/23 Time DME Agency Contacted: [phone] Representative spoke with at DME Agency: French Ana HH Arranged: RN, PT, OT [facility] Agency: [facility] Date HH Agency Contacted: 12/12/23 Time HH Agency Contacted: [phone] Representative spoke with at [facility] Agency: Aggie Cosier   Social Determinants of Health (SDOH) Interventions SDOH Screenings   Food Insecurity: No Food Insecurity (12/11/2023)  Housing: Low Risk  (12/11/2023)  Transportation Needs: No Transportation Needs (12/11/2023)  Utilities: Not At Risk (12/11/2023)  Depression (PHQ2-9): Low Risk  (11/11/2020)  Social Connections: Moderately Isolated (12/11/2023)  Tobacco Use: Medium Risk (12/13/2023)    Readmission Risk Interventions    11/20/2023    3:47 PM  Readmission Risk Prevention Plan  Post Dischage Appt Complete  Medication Screening Complete  Transportation Screening Complete

## 2023-12-17 NOTE — Consult Note (Signed)
WOC Nurse wound follow up Wound type: surgical  Measurement: 5cm x 2.5cm x 2.0cm tunnel at 12 o'clock, otherwise wound bed is 0.3cm   Wound bed: early granulation, pale Drainage (amount, consistency, odor) scant, serous in VAC canister Periwound: intact Dressing procedure/placement/frequency: Removed old NPWT dressing (1) pc black Filled wound with  1____ piece of black foam  Sealed NPWT dressing at HG Patient received IV pain medication per bedside nurse prior to dressing change Patient tolerated procedure well  WOC will provide NPWT dressing change again on Thursday if still inpatient, notified TOC of the need for SNF VAC If compatible unit, ok to clamp for transport If not compatible will need to remove dressing, place saline moist gauze dressing, SNF to place dressing and machine upon arrival   Discussed this with patient and her daughter at the bedside.   Jill Higgins [facility], CNS, [employer] [phone]

## 2023-12-17 NOTE — Progress Notes (Signed)
Wound vac continues to alarm intermittently with partial blockage. Drainage from wound and foam seem to be partially blocking track pad. Replaced track pad again for improved suction.

## 2023-12-17 NOTE — Progress Notes (Signed)
PROGRESS NOTE    Jill Higgins  WUJ:[id number] DOB: Mar 18, 1946 DOA: 12/07/2023 PCP: Merri Brunette, MD   Brief Narrative:  Jill Higgins is a 78 y.o. female with a history of hypertension, diabetes mellitus type 2, depression, anxiety, PAD with thrombosis of right extremity iliac/common femoral and disabling claudication status post axillofemoral bypass graft.  Patient presented secondary to discharge from groin incision with associated chills concerning for evidence of infection.  Patient met sepsis criteria on admission to start empiric antibiotics.  Blood cultures obtained prior to antibiotics.  Vascular surgery consulted with plan for surgical management.   **Interim History She is found to have MRSA bacteremia and ID has been consulted and has been changed to IV Dapto.  She underwent surgical intervention for her right axillofemoral bypass graft infection.  PT OT recommending SNF and was assisting with disposition now that PICC line is in and she appears medically stable.  Assessment and Plan:  Severe Sepsis -Present on admission with associated lactic acidosis.  -Secondary to graft infection.  -Blood cultures (1/17) obtained on admission and are significant for MRSA.  -Repeat CX from 1/19 showing NGTD at 5 Days -Empiric Vancomycin and Cefepime started and transitioned to Vancomycin monotherapy and now Daptomycin.  -ID consulted and appreciate evaluation recommendations and have signed off the case   Right Axillo-Femoral Bypass Graft Infection -Present on admission. Surgery performed on 12/26.  -Vascular surgery consulted and are following.  -C/w Pain control with Acetaminophen 650 mg po/IV q6hprn Mild Pain, Oxycodone 5-10 mg po q4hprn Moderate Pain, and IV Hydromorphone 0.5 mg q4hprn -WBC Trend had improved but slightly trended up: Recent Labs  Lab 12/11/23 0335 12/12/23 0324 12/13/23 1014 12/14/23 0929 12/15/23 0500 12/16/23 0307 12/17/23 0510  WBC 7.9 9.4 12.1* 9.1 7.7  8.1 6.6  -Cefepime discontinued. Patient underwent removal of bypass on 1/19; culture significant for GPCs -Vancomycin changed to IV Daptomycin by ID -Follow-up graft culture data and it shows:  ABUNDANT METHICILLIN RESISTANT STAPHYLOCOCCUS AUREUS NO ANAEROBES ISOLATED; CULTURE IN PROGRESS FOR 5 DAYS   Report Status PENDING  Organism ID, Bacteria METHICILLIN RESISTANT STAPHYLOCOCCUS AUREUS  Resulting Agency [facility]     Susceptibility    Methicillin resistant staphylococcus aureus    MIC    CIPROFLOXACIN >=8 RESISTANT Resistant    CLINDAMYCIN <=0.25 SENS... Sensitive    ERYTHROMYCIN <=0.25 SENS... Sensitive    GENTAMICIN <=0.5 SENSI... Sensitive    Inducible Clindamycin NEGATIVE Sensitive    LINEZOLID 2 SENSITIVE Sensitive    OXACILLIN >=4 RESISTANT Resistant    RIFAMPIN <=0.5 SENSI... Sensitive    TETRACYCLINE <=1 SENSITIVE Sensitive    TRIMETH/SULFA <=10 SENSIT... Sensitive    VANCOMYCIN <=0.5 SENSI... Sensitive     -Right forearm and Hand edematous will check RUE Duplex to Evaluate for DVT -VAC was last changed today (12/17/23) Monday by WOC nurse and TOC is involved as the PT evaluation has now changed the recommendation for SNF; TOC assisting with D/C    MRSA Bacteremia -Source is likely right groin wound. ID consulted -Patient was managed on Vancomycin. Repeat blood cultures (1/19) obtained and are no growth to date. Transthoracic Echocardiogram significant for no evidence of valvular vegetations. -ID recommendations: Vancomycin now changed to Daptomycin on 12/11/23 (plan for 4-6 weeks), -Cardiology consulted for Transesophageal Echocardiogram which was recommended by ID and this was done today and showed now LA/LAA Thrombus or masses with EF of 60-65% -Since her blood cultures are negative she required a IJ tunneled PICC line given her  anatomy.  This is now in place and she appears medically stable for discharge needs to select a SNF and have insurance authorization and  bed availability   Right Arm Swelling -Upper extremity duplex done and showed an SVT -U/S formal read showed "No evidence of deep vein thrombosis in the upper extremity. Findings consistent with acute superficial vein thrombosis involving the right cephalic vein. Occlusive Acute SVT seen in the Cephalic Vein in the proximal upper arm." -C/w Supportive Care and Mobilize    PAD -S/p axillo-femoral bypass graft.  -Patient follows with vascular surgery as an outpatient.  -LDL is down to 15. -Continue Atorvastatin 80 mg po Daily, Aspirin 81 mg po qHS and Clopidogrel 75 mg po Daily    Primary Hypertension -Patient is on Amlodipine, Losartan and Toprol Xl as an outpatient. -Antihypertensives held on admission secondary to hypotension. -Continue to Monitor BP per Protocol -Last BP reading was 109/93   Diabetes Mellitus, Type 2 -Uncontrolled with hyperglycemia.  -Hold home Metformin 500 mg po BID -Last hemoglobin A1C of 9.5%.  -Patient is managed on insulin glargine, insulin aspart, and metformin as an outpatient and started on SSI on admission. Blood sugar low-normal initially, now worsening. -Continue Sensitive Novolog SSI AC and  Semglee 5 units daily -CBG Trend: Recent Labs  Lab 12/15/23 2143 12/16/23 0607 12/16/23 1240 12/16/23 1619 12/16/23 2043 12/17/23 0546 12/17/23 1206  GLUCAP 100* 141* 193* 221* 107* 159* 132*    Depression and Anxiety -Continue Duloxetine 60 mg po Daily  Metabolic Acidosis -Mild and improved. CO2 is now 23, AG is 6 , Chloride Level is now 104 -Continue to Monitor and Trend and repeat CMP in the AM   Insomnia -Continue Temazepam 30 mg po qHSprn Sleep  Hypokalemia -Patient's K+ Level Trend: Recent Labs  Lab 12/11/23 0335 12/12/23 0324 12/13/23 1014 12/14/23 0929 12/15/23 0500 12/16/23 0307 12/17/23 0510  K 3.2* 3.5 4.0 3.8 4.0 4.1 3.6  -Continue to Monitor and Replete as Necessary -Repeat CMP in the AM   Hyponatremia -Mild. Na+  Trend: Recent Labs  Lab 12/11/23 0335 12/12/23 0324 12/13/23 1014 12/14/23 0929 12/15/23 0500 12/16/23 0307 12/17/23 0510  NA 141 135 136 138 139 139 133*  -Continue to Monitor and Trend and Repeat CMP in the AM  Hypomagnesemia -Patient's Mag Level Trend: Recent Labs  Lab 12/10/23 0355 12/12/23 0324 12/13/23 1014 12/14/23 0929 12/15/23 0500 12/16/23 0307 12/17/23 0510  MG 2.0 1.5* 1.6* 1.8 1.5* 2.6* 1.8  -Replete with IV Mag Sulfate 2 grams  -Continue to Monitor and Replete as Necessary -Repeat Mag in the AM   ABLA and Perioperative Blood loss superimposed on Normocytic Anemia, now stable -Secondary to vascular surgery. Patient with hemoglobin down to 7.1 g/dL requiring a transfusion of 2 units of PRBC. Posttransfusion hemoglobin now stable -Hgb/Hct Trend: Recent Labs  Lab 12/11/23 0335 12/12/23 0324 12/13/23 1014 12/14/23 0929 12/15/23 0500 12/16/23 0307 12/17/23 0510  HGB 8.6* 8.2* 10.1* 9.8* 9.0* 10.1* 8.7*  HCT 24.6* 24.4* 30.3* 29.3* 26.7* 30.4* 26.6*  MCV 93.2 94.2 95.6 94.5 93.7 95.9 96.7  -Continue to Monitor for S/Sx of Bleeding; No overt bleeding noted -Repeat CBC in the AM   Hypoalbuminemia -Patient's Albumin Trend: Recent Labs  Lab 12/10/23 0355 12/11/23 0335 12/13/23 1014 12/14/23 0929 12/15/23 0500 12/16/23 0307 12/17/23 0510  ALBUMIN 1.8* 1.7* 1.8* 1.9* 1.8* 2.0* 1.7*  -Continue to Monitor and Trend and repeat CMP in the AM   Severe Protein Calorie Malnutrition in the context of Chronic  illness Nutrition Status: Nutrition Problem: Severe Malnutrition Etiology: chronic illness (chronic pancreatitits) Signs/Symptoms: severe fat depletion, severe muscle depletion, energy intake < 75% for > or equal to 1 month Interventions: Boost Plus, Magic cup, MVI   DVT prophylaxis: heparin injection 5,000 Units Start: 12/12/23 0800 SCD's Start: 12/09/23 1835 SCDs Start: 12/07/23 2343    Code Status: Full Code Family Communication: Discussed with  Daughter at bedside  Disposition Plan:  Level of care: Progressive Status is: Inpatient Remains inpatient appropriate because: Medically stable for SNF   Consultants:  Vascular Surgery Infectious Diseases  Cardiology for TEE  Procedures:  As delineated as above   Procedure by Dr. Chestine Spore: 1.  Harvest of left arm basilic vein 2.  Redo exposure of right axillary artery 3.  Excision of right axillary artery to femoral artery bypass 4.  Vein patch angioplasty right axillary artery 5.  Vein patch angioplasty of right femoral artery distal bypass 6.  Vac right groin  Antimicrobials:  Anti-infectives (From admission, onward)    Start     Dose/Rate Route Frequency Ordered Stop   12/16/23 0000  daptomycin (CUBICIN) IVPB        500 mg Intravenous Every 24 hours 12/16/23 2118 01/01/24 2359   12/11/23 1400  DAPTOmycin (CUBICIN) IVPB 500 mg/42mL premix        500 mg 100 mL/hr over 30 Minutes Intravenous Daily 12/11/23 0836     12/10/23 1400  DAPTOmycin (CUBICIN) 400 mg in sodium chloride 0.9 % IVPB  Status:  Discontinued        8 mg/kg  50 kg 116 mL/hr over 30 Minutes Intravenous Daily 12/10/23 1111 12/11/23 0836   12/09/23 2100  vancomycin (VANCOCIN) 750 mg in sodium chloride 0.9 % 250 mL IVPB  Status:  Discontinued        750 mg 265 mL/hr over 60 Minutes Intravenous Every 24 hours 12/09/23 1759 12/10/23 1111   12/09/23 0800  vancomycin (VANCOCIN) IVPB 1000 mg/200 mL premix  Status:  Discontinued        1,000 mg 200 mL/hr over 60 Minutes Intravenous  Once 12/09/23 0746 12/09/23 0807   12/09/23 0748  vancomycin (VANCOCIN) 1-5 GM/200ML-% IVPB       Note to Pharmacy: Aquilla Hacker M: cabinet override      12/09/23 0748 12/09/23 1959   12/08/23 2100  vancomycin (VANCOCIN) 750 mg in sodium chloride 0.9 % 250 mL IVPB  Status:  Discontinued        750 mg 250 mL/hr over 60 Minutes Intravenous Every 24 hours 12/07/23 2359 12/09/23 1759   12/07/23 2100  vancomycin (VANCOCIN) IVPB 1000 mg/200  mL premix        1,000 mg 200 mL/hr over 60 Minutes Intravenous  Once 12/07/23 2048 12/07/23 2313   12/07/23 2100  ceFEPIme (MAXIPIME) 2 g in sodium chloride 0.9 % 100 mL IVPB  Status:  Discontinued        2 g 200 mL/hr over 30 Minutes Intravenous Every 12 hours 12/07/23 2053 12/08/23 1519       Subjective: Seen and examined at bedside and had some pain this morning was doing well now.  No chest pain or shortness of breath.  States her second toe is sensitive to just light touch.  No lightheadedness or dizziness.  No other concerns or complaints at this time.  Objective: Vitals:   12/17/23 0310 12/17/23 0823 12/17/23 1205 12/17/23 1340  BP: 116/64 (!) 146/90 (!) 109/93   Pulse: 100 78 72 71  Resp:  15 16 15 12   Temp: 97.6 F (36.4 C) (!) 97.4 F (36.3 C) 98.2 F (36.8 C)   TempSrc: Oral Oral Oral   SpO2: 100%  96% 99%  Weight:      Height:        Intake/Output Summary (Last 24 hours) at 12/17/2023 1457 Last data filed at 12/17/2023 1340 Gross per 24 hour  Intake 480 ml  Output --  Net 480 ml   Filed Weights   12/07/23 2006 12/09/23 0648  Weight: 50 kg 50 kg   Examination: Physical Exam:  Constitutional: Thin chronically ill-appearing female in no acute distress Respiratory: Diminished to auscultation bilaterally, no wheezing, rales, rhonchi or crackles. Normal respiratory effort and patient is not tachypenic. No accessory muscle use.  Unlabored breathing Cardiovascular: RRR, no murmurs / rubs / gallops. S1 and S2 auscultated.  Trace extremity edema Abdomen: Soft, non-tender, non-distended. Bowel sounds positive.  GU: Deferred. Musculoskeletal: Has a left internal jugular PICC noted and has some right arm swelling.  Second right toe is very sensitive Neurologic: CN 2-12 grossly intact with no focal deficits. Romberg sign and cerebellar reflexes not assessed.  Psychiatric: Normal judgment and insight. Alert and oriented x 3. Normal mood and appropriate affect.   Data  Reviewed: I have personally reviewed following labs and imaging studies  CBC: Recent Labs  Lab 12/13/23 1014 12/14/23 0929 12/15/23 0500 12/16/23 0307 12/17/23 0510  WBC 12.1* 9.1 7.7 8.1 6.6  NEUTROABS 8.8* 5.3 4.7 4.6 4.8  HGB 10.1* 9.8* 9.0* 10.1* 8.7*  HCT 30.3* 29.3* 26.7* 30.4* 26.6*  MCV 95.6 94.5 93.7 95.9 96.7  PLT 227 301 302 367 368   Basic Metabolic Panel: Recent Labs  Lab 12/13/23 1014 12/14/23 0929 12/15/23 0500 12/16/23 0307 12/17/23 0510  NA 136 138 139 139 133*  K 4.0 3.8 4.0 4.1 3.6  CL 105 106 106 109 104  CO2 21* 24 24 22 23   GLUCOSE 177* 126* 130* 89 212*  BUN 13 8 8  6* 7*  CREATININE 0.53 0.52 0.68 0.89 0.67  CALCIUM 7.9* 8.1* 8.1* 8.0* 7.6*  MG 1.6* 1.8 1.5* 2.6* 1.8  PHOS 3.3 3.5 3.8 4.4 3.4   GFR: Estimated Creatinine Clearance: 45.7 mL/min (by C-G formula based on SCr of 0.67 mg/dL). Liver Function Tests: Recent Labs  Lab 12/13/23 1014 12/14/23 0929 12/15/23 0500 12/16/23 0307 12/17/23 0510  AST 25 18 27  36 21  ALT 19 17 17 20 17   ALKPHOS 70 73 77 80 74  BILITOT 0.5 0.4 0.6 0.6 0.4  PROT 4.6* 5.0* 4.8* 5.1* 4.8*  ALBUMIN 1.8* 1.9* 1.8* 2.0* 1.7*   No results for input(s): "LIPASE", "AMYLASE" in the last 168 hours. No results for input(s): "AMMONIA" in the last 168 hours. Coagulation Profile: No results for input(s): "INR", "PROTIME" in the last 168 hours. Cardiac Enzymes: Recent Labs  Lab 12/11/23 0335 12/13/23 1014  CKTOTAL 114 44   BNP (last 3 results) No results for input(s): "PROBNP" in the last 8760 hours. HbA1C: No results for input(s): "HGBA1C" in the last 72 hours. CBG: Recent Labs  Lab 12/16/23 1240 12/16/23 1619 12/16/23 2043 12/17/23 0546 12/17/23 1206  GLUCAP 193* 221* 107* 159* 132*   Lipid Profile: No results for input(s): "CHOL", "HDL", "LDLCALC", "TRIG", "CHOLHDL", "LDLDIRECT" in the last 72 hours. Thyroid Function Tests: No results for input(s): "TSH", "T4TOTAL", "FREET4", "T3FREE", "THYROIDAB"  in the last 72 hours. Anemia Panel: No results for input(s): "VITAMINB12", "FOLATE", "FERRITIN", "TIBC", "IRON", "RETICCTPCT" in the last  72 hours. Sepsis Labs: No results for input(s): "PROCALCITON", "LATICACIDVEN" in the last 168 hours.  Recent Results (from the past 240 hours)  Culture, blood (Routine x 2)     Status: Abnormal (Preliminary result)   Collection Time: 12/07/23  8:17 PM   Specimen: BLOOD  Result Value Ref Range Status   Specimen Description BLOOD LEFT ANTECUBITAL  Final   Special Requests   Final    BOTTLES DRAWN AEROBIC AND ANAEROBIC Blood Culture results may not be optimal due to an inadequate volume of blood received in culture bottles   Culture  Setup Time   Final    GRAM POSITIVE COCCI ANAEROBIC BOTTLE ONLY CRITICAL RESULT CALLED TO, READ BACK BY AND VERIFIED WITH: PHARMD JESSICA MILLEN [id number] AT 1411 BY EC    Culture (A)  Final    METHICILLIN RESISTANT STAPHYLOCOCCUS AUREUS CULTURE REINCUBATED FOR BETTER GROWTH Performed at [facility], [address], [place], [place] [address]    Report Status PENDING  Incomplete   Organism ID, Bacteria METHICILLIN RESISTANT STAPHYLOCOCCUS AUREUS  Final      Susceptibility   Methicillin resistant staphylococcus aureus - MIC*    CIPROFLOXACIN >=8 RESISTANT Resistant     ERYTHROMYCIN <=0.25 SENSITIVE Sensitive     GENTAMICIN <=0.5 SENSITIVE Sensitive     OXACILLIN >=4 RESISTANT Resistant     TETRACYCLINE <=1 SENSITIVE Sensitive     VANCOMYCIN <=0.5 SENSITIVE Sensitive     TRIMETH/SULFA <=10 SENSITIVE Sensitive     CLINDAMYCIN <=0.25 SENSITIVE Sensitive     RIFAMPIN <=0.5 SENSITIVE Sensitive     Inducible Clindamycin NEGATIVE Sensitive     LINEZOLID 2 SENSITIVE Sensitive     * METHICILLIN RESISTANT STAPHYLOCOCCUS AUREUS  Blood Culture ID Panel (Reflexed)     Status: Abnormal   Collection Time: 12/07/23  8:17 PM  Result Value Ref Range Status   Enterococcus faecalis NOT DETECTED NOT DETECTED Final    Enterococcus Faecium NOT DETECTED NOT DETECTED Final   Listeria monocytogenes NOT DETECTED NOT DETECTED Final   Staphylococcus species DETECTED (A) NOT DETECTED Final    Comment: CRITICAL RESULT CALLED TO, READ BACK BY AND VERIFIED WITH: PHARMD JESSICA MILLEN [id number] AT 1411 BY EC    Staphylococcus aureus (BCID) DETECTED (A) NOT DETECTED Final    Comment: Methicillin (oxacillin)-resistant Staphylococcus aureus (MRSA). MRSA is predictably resistant to beta-lactam antibiotics (except ceftaroline). Preferred therapy is vancomycin unless clinically contraindicated. Patient requires contact precautions if  hospitalized. CRITICAL RESULT CALLED TO, READ BACK BY AND VERIFIED WITH: PHARMD JESSICA MILLEN [id number] AT 1411 BY EC    Staphylococcus epidermidis NOT DETECTED NOT DETECTED Final   Staphylococcus lugdunensis NOT DETECTED NOT DETECTED Final   Streptococcus species NOT DETECTED NOT DETECTED Final   Streptococcus agalactiae NOT DETECTED NOT DETECTED Final   Streptococcus pneumoniae NOT DETECTED NOT DETECTED Final   Streptococcus pyogenes NOT DETECTED NOT DETECTED Final   A.calcoaceticus-baumannii NOT DETECTED NOT DETECTED Final   Bacteroides fragilis NOT DETECTED NOT DETECTED Final   Enterobacterales NOT DETECTED NOT DETECTED Final   Enterobacter cloacae complex NOT DETECTED NOT DETECTED Final   Escherichia coli NOT DETECTED NOT DETECTED Final   Klebsiella aerogenes NOT DETECTED NOT DETECTED Final   Klebsiella oxytoca NOT DETECTED NOT DETECTED Final   Klebsiella pneumoniae NOT DETECTED NOT DETECTED Final   Proteus species NOT DETECTED NOT DETECTED Final   Salmonella species NOT DETECTED NOT DETECTED Final   Serratia marcescens NOT DETECTED NOT DETECTED Final   Haemophilus influenzae NOT DETECTED NOT  DETECTED Final   Neisseria meningitidis NOT DETECTED NOT DETECTED Final   Pseudomonas aeruginosa NOT DETECTED NOT DETECTED Final   Stenotrophomonas maltophilia NOT DETECTED NOT DETECTED Final    Candida albicans NOT DETECTED NOT DETECTED Final   Candida auris NOT DETECTED NOT DETECTED Final   Candida glabrata NOT DETECTED NOT DETECTED Final   Candida krusei NOT DETECTED NOT DETECTED Final   Candida parapsilosis NOT DETECTED NOT DETECTED Final   Candida tropicalis NOT DETECTED NOT DETECTED Final   Cryptococcus neoformans/gattii NOT DETECTED NOT DETECTED Final   Meth resistant mecA/C and MREJ DETECTED (A) NOT DETECTED Final    Comment: CRITICAL RESULT CALLED TO, READ BACK BY AND VERIFIED WITH: PHARMD JESSICA MILLEN [id number] AT 1411 BY EC Performed at [facility], [address], [place], [place] [address]   Culture, blood (Routine x 2)     Status: None   Collection Time: 12/07/23  8:20 PM   Specimen: BLOOD RIGHT ARM  Result Value Ref Range Status   Specimen Description BLOOD RIGHT ARM  Final   Special Requests   Final    BOTTLES DRAWN AEROBIC AND ANAEROBIC Blood Culture results may not be optimal due to an inadequate volume of blood received in culture bottles   Culture   Final    NO GROWTH 5 DAYS Performed at [facility], [address] [place], [place] [address]    Report Status 12/12/2023 FINAL  Final  Surgical pcr screen     Status: Abnormal   Collection Time: 12/08/23  7:57 PM   Specimen: Nasal Mucosa; Nasal Swab  Result Value Ref Range Status   MRSA, PCR POSITIVE (A) NEGATIVE Final    Comment: RESULT CALLED TO, READ BACK BY AND VERIFIED WITH: T IRBY,RN@2258  12/08/23 MK    Staphylococcus aureus POSITIVE (A) NEGATIVE Final    Comment: (NOTE) The Xpert SA Assay (FDA approved for NASAL specimens in patients 75 years of age and older), is one component of a comprehensive surveillance program. It is not intended to diagnose infection nor to guide or monitor treatment. Performed at [facility], [address], [place], [place] [address]   Culture, blood (Routine X 2) w Reflex to ID Panel     Status: None   Collection Time: 12/09/23  3:07 AM    Specimen: BLOOD RIGHT ARM  Result Value Ref Range Status   Specimen Description BLOOD RIGHT ARM  Final   Special Requests   Final    BOTTLES DRAWN AEROBIC AND ANAEROBIC Blood Culture results may not be optimal due to an inadequate volume of blood received in culture bottles   Culture   Final    NO GROWTH 5 DAYS Performed at [facility], [address], [place], [place] [address]    Report Status 12/14/2023 FINAL  Final  Culture, blood (Routine X 2) w Reflex to ID Panel     Status: None   Collection Time: 12/09/23  3:07 AM   Specimen: BLOOD RIGHT HAND  Result Value Ref Range Status   Specimen Description BLOOD RIGHT HAND  Final   Special Requests   Final    BOTTLES DRAWN AEROBIC AND ANAEROBIC Blood Culture results may not be optimal due to an inadequate volume of blood received in culture bottles   Culture   Final    NO GROWTH 5 DAYS Performed at [facility], [address], [place], [place] [address]    Report Status 12/14/2023 FINAL  Final  Aerobic/Anaerobic Culture w Gram Stain (surgical/deep wound)     Status: None   Collection Time: 12/09/23 10:16 AM   Specimen: Wound  Result Value Ref Range Status   Specimen Description WOUND  Final   Special Requests RIGHT GROIN  Final   Gram Stain   Final    NO WBC SEEN RARE GRAM POSITIVE COCCI IN PAIRS IN SINGLES    Culture   Final    MODERATE STAPHYLOCOCCUS AUREUS SUSCEPTIBILITIES PERFORMED ON PREVIOUS CULTURE WITHIN THE LAST 5 DAYS. NO ANAEROBES ISOLATED Performed at [facility], [address], [place], [place] [address]    Report Status 12/13/2023 FINAL  Final  Aerobic/Anaerobic Culture w Gram Stain (surgical/deep wound)     Status: None   Collection Time: 12/09/23 10:18 AM   Specimen: Wound  Result Value Ref Range Status   Specimen Description WOUND  Final   Special Requests portion of graft from right groin  Final   Gram Stain   Final    MODERATE WBC PRESENT,BOTH PMN AND MONONUCLEAR FEW GRAM POSITIVE  COCCI IN PAIRS    Culture   Final    ABUNDANT METHICILLIN RESISTANT STAPHYLOCOCCUS AUREUS NO ANAEROBES ISOLATED Performed at [facility], [address], [place], [place] [address]    Report Status 12/14/2023 FINAL  Final   Organism ID, Bacteria METHICILLIN RESISTANT STAPHYLOCOCCUS AUREUS  Final      Susceptibility   Methicillin resistant staphylococcus aureus - MIC*    CIPROFLOXACIN >=8 RESISTANT Resistant     ERYTHROMYCIN <=0.25 SENSITIVE Sensitive     GENTAMICIN <=0.5 SENSITIVE Sensitive     OXACILLIN >=4 RESISTANT Resistant     TETRACYCLINE <=1 SENSITIVE Sensitive     VANCOMYCIN <=0.5 SENSITIVE Sensitive     TRIMETH/SULFA <=10 SENSITIVE Sensitive     CLINDAMYCIN <=0.25 SENSITIVE Sensitive     RIFAMPIN <=0.5 SENSITIVE Sensitive     Inducible Clindamycin NEGATIVE Sensitive     LINEZOLID 2 SENSITIVE Sensitive     * ABUNDANT METHICILLIN RESISTANT STAPHYLOCOCCUS AUREUS  Aerobic/Anaerobic Culture w Gram Stain (surgical/deep wound)     Status: None   Collection Time: 12/09/23 10:23 AM   Specimen: Wound  Result Value Ref Range Status   Specimen Description WOUND  Final   Special Requests DISTAL PORTION OF GRAFT FROM RIGHT GROIN  Final   Gram Stain   Final    RARE WBC PRESENT, PREDOMINANTLY MONONUCLEAR NO ORGANISMS SEEN    Culture   Final    FEW STAPHYLOCOCCUS AUREUS SUSCEPTIBILITIES PERFORMED ON PREVIOUS CULTURE WITHIN THE LAST 5 DAYS. NO ANAEROBES ISOLATED Performed at [facility], [address] [place], [place] [address]    Report Status 12/14/2023 FINAL  Final    Radiology Studies: No results found.  Scheduled Meds:  aspirin EC  81 mg Oral QHS   atorvastatin  80 mg Oral Daily   Chlorhexidine Gluconate Cloth  6 each Topical Daily   clopidogrel  75 mg Oral Q breakfast   docusate sodium  100 mg Oral Daily   DULoxetine  60 mg Oral Daily   heparin injection (subcutaneous)  5,000 Units Subcutaneous Q8H   insulin aspart  0-9 Units Subcutaneous TID WC    insulin glargine-yfgn  3 Units Subcutaneous Daily   lactose free nutrition  237 mL Oral BID BM   multivitamin with minerals  1 tablet Oral Daily   pantoprazole  40 mg Oral BID   sodium chloride flush  3 mL Intravenous Q12H  Continuous Infusions:  DAPTOmycin 500 mg (12/17/23 1340)   magnesium sulfate bolus IVPB      LOS: 10 days   Marguerita Merles, DO [facility] Available via Epic secure chat 7am-7pm After these hours, please refer to coverage provider listed on [email or web address] 12/17/2023, 2:57 PM

## 2023-12-17 NOTE — Plan of Care (Signed)
Problem: Coping: Goal: Ability to adjust to condition or change in health will improve Outcome: Progressing   Problem: Fluid Volume: Goal: Ability to maintain a balanced intake and output will improve Outcome: Progressing   Problem: Nutritional: Goal: Progress toward achieving an optimal weight will improve Outcome: Progressing   Problem: Skin Integrity: Goal: Risk for impaired skin integrity will decrease Outcome: Progressing

## 2023-12-17 NOTE — Progress Notes (Signed)
[facility] OF [place] PROGRESS NOTE  ASSESSMENT / PLAN: Jill Higgins is a 78 y.o. female status post excision of right axilo-femoral bypass 12/09/23 for infection. Doing well overall.  PRN pain control Diet as tolerated Mobilize with PT / OT Continue Daptomycin IV via hickman per ID Anticipate discharge to SNF in next 24-48 h Appreciate Dr. Chestine Spore and all involved in taking care of this pleasant woman  SUBJECTIVE: In good spirits. Reports right first / second toe sensitive to light touch (e.g. sock or bedsheet touching). No classic rest pain symptoms. Appears to be healing well.   OBJECTIVE: BP 116/64 (BP Location: Left Leg)   Pulse 100   Temp 97.6 F (36.4 C) (Oral)   Resp 15   Ht 5' 2.99" (1.6 m)   Wt 50 kg   SpO2 100%   BMI 19.53 kg/m   No distress Regular rate and rhythm Unlabored breathing 2+ R radial pulse Brisk R DP doppler signal Right groin VAC with good seal Right axillary incision healing well with staples Left basilic vein incision healing well     Latest Ref Rng & Units 12/17/2023    5:10 AM 12/16/2023    3:07 AM 12/15/2023    5:00 AM  CBC  WBC 4.0 - 10.5 K/uL 6.6  8.1  7.7   Hemoglobin 12.0 - 15.0 g/dL 8.7  40.9  9.0   Hematocrit 36.0 - 46.0 % 26.6  30.4  26.7   Platelets 150 - 400 K/uL 368  367  302         Latest Ref Rng & Units 12/17/2023    5:10 AM 12/16/2023    3:07 AM 12/15/2023    5:00 AM  CMP  Glucose 70 - 99 mg/dL 811  89  914   BUN 8 - 23 mg/dL 7  6  8    Creatinine 0.44 - 1.00 mg/dL 7.82  9.56  2.13   Sodium 135 - 145 mmol/L 133  139  139   Potassium 3.5 - 5.1 mmol/L 3.6  4.1  4.0   Chloride 98 - 111 mmol/L 104  109  106   CO2 22 - 32 mmol/L 23  22  24    Calcium 8.9 - 10.3 mg/dL 7.6  8.0  8.1   Total Protein 6.5 - 8.1 g/dL 4.8  5.1  4.8   Total Bilirubin 0.0 - 1.2 mg/dL 0.4  0.6  0.6   Alkaline Phos 38 - 126 U/L 74  80  77   AST 15 - 41 U/L 21  36  27   ALT 0 - 44 U/L 17  20  17      Estimated Creatinine  Clearance: 45.7 mL/min (by C-G formula based on SCr of 0.67 mg/dL).  Rande Brunt. Lenell Antu, MD [facility] of [facility] Phone Number: [phone] 12/17/2023 7:56 AM

## 2023-12-17 NOTE — Progress Notes (Signed)
Physical Therapy Treatment Patient Details Name: ELONDA GIULIANO MRN: [id number] DOB: [birth year] Today's Date: 12/17/2023   History of Present Illness The pt is a 78 yo female presenting 1/17 with drainage from R groin incision (recent R axillary - femoral bypass on 12/26). Now s/p redo R axilla - femoral artery bypass with wound vac placement 1/19. PMH includes: ight common femoral endarterectomy with right femoral to above-knee popliteal bypass, stenting of the right iliac and common femoral arteries, bronchitis, bulging disc, depression w/anxiety, DM II, fibromyalgia, GERD, glaucoma, hyperlipidemia, HTN, PVD, and sleep apnea.    PT Comments  Pt progressing well with mobility. Reporting no pain R groin but now reporting R shoulder pain. She required min assist bed mobility, CGA transfers, and min assist amb 175' with RW. Slow cadence requiring increased time to complete all functional mobility skills. Pt in recliner with feet elevated at end of session. Continue to recommend ST SNF at d/c.      If plan is discharge home, recommend the following: Help with stairs or ramp for entrance;Assist for transportation;Assistance with cooking/housework;A lot of help with walking and/or transfers;A little help with bathing/dressing/bathroom   Can travel by private vehicle        Equipment Recommendations  None recommended by PT    Recommendations for Other Services       Precautions / Restrictions Precautions Precautions: Fall;Other (comment) Precaution Comments: wound vac R groin, picc line LUE     Mobility  Bed Mobility Overal bed mobility: Needs Assistance Bed Mobility: Supine to Sit     Supine to sit: HOB elevated, Min assist, Used rails     General bed mobility comments: increased time    Transfers Overall transfer level: Needs assistance Equipment used: Rolling walker (2 wheels) Transfers: Sit to/from Stand Sit to Stand: Contact guard assist           General transfer  comment: cues for hand placement and sequencing    Ambulation/Gait Ambulation/Gait assistance: Min assist Gait Distance (Feet): 175 Feet Assistive device: Rolling walker (2 wheels) Gait Pattern/deviations: Step-through pattern, Decreased stride length, Trunk flexed Gait velocity: decreased Gait velocity interpretation: <1.31 ft/sec, indicative of household ambulator   General Gait Details: assist to maintain balance and manage RW   Stairs             Wheelchair Mobility     Tilt Bed    Modified Rankin (Stroke Patients Only)       Balance Overall balance assessment: Needs assistance Sitting-balance support: Feet supported, No upper extremity supported Sitting balance-Leahy Scale: Good     Standing balance support: Bilateral upper extremity supported, During functional activity, Reliant on assistive device for balance Standing balance-Leahy Scale: Poor                              Cognition Arousal: Alert Behavior During Therapy: WFL for tasks assessed/performed Overall Cognitive Status: Impaired/Different from baseline Area of Impairment: Attention, Following commands, Safety/judgement, Awareness, Problem solving, Memory                   Current Attention Level: Selective Memory: Decreased short-term memory Following Commands: Follows one step commands with increased time, Follows one step commands consistently Safety/Judgement: Decreased awareness of deficits, Decreased awareness of safety Awareness: Emergent Problem Solving: Slow processing, Decreased initiation, Difficulty sequencing, Requires verbal cues, Requires tactile cues          Exercises  General Comments General comments (skin integrity, edema, etc.): VSS on RA      Pertinent Vitals/Pain Pain Assessment Pain Assessment: Faces Faces Pain Scale: Hurts little more Pain Location: R shld Pain Descriptors / Indicators: Discomfort, Grimacing Pain Intervention(s):  Monitored during session, Repositioned    Home Living                          Prior Function            PT Goals (current goals can now be found in the care plan section) Acute Rehab PT Goals Patient Stated Goal: home Progress towards PT goals: Progressing toward goals    Frequency    Min 1X/week      PT Plan      Co-evaluation              AM-PAC PT "6 Clicks" Mobility   Outcome Measure  Help needed turning from your back to your side while in a flat bed without using bedrails?: A Little Help needed moving from lying on your back to sitting on the side of a flat bed without using bedrails?: A Little Help needed moving to and from a bed to a chair (including a wheelchair)?: A Little Help needed standing up from a chair using your arms (e.g., wheelchair or bedside chair)?: A Little Help needed to walk in hospital room?: A Little Help needed climbing 3-5 steps with a railing? : Total 6 Click Score: 16    End of Session Equipment Utilized During Treatment: Gait belt Activity Tolerance: Patient tolerated treatment well Patient left: in chair;with call bell/phone within reach;with chair alarm set Nurse Communication: Mobility status PT Visit Diagnosis: Other abnormalities of gait and mobility (R26.89);Pain Pain - Right/Left: Right Pain - part of body: Shoulder     Time: 1610-9604 PT Time Calculation (min) (ACUTE ONLY): 24 min  Charges:    $Gait Training: 23-37 mins PT General Charges $$ ACUTE PT VISIT: 1 Visit                     Ferd Glassing., PT  Office # [phone]    Ilda Foil 12/17/2023, 8:47 AM

## 2023-12-17 NOTE — Progress Notes (Signed)
Mobility Specialist Progress Note:    12/17/23 1007  Mobility  Activity Transferred from chair to bed  Level of Assistance Minimal assist, patient does 75% or more  Assistive Device Other (Comment) (HHA)  Distance Ambulated (ft) 4 ft  Activity Response Tolerated well  Mobility Referral Yes  Mobility visit 1 Mobility  Mobility Specialist Start Time (ACUTE ONLY) 1000  Mobility Specialist Stop Time (ACUTE ONLY) 1007  Mobility Specialist Time Calculation (min) (ACUTE ONLY) 7 min   Pt received in chair, requesting assistance to transfer back to bed. Required MinA via HHA to transfer. Tolerated well, asx throughout. Lying comfortably in bed with call bell in reach. All needs met.    Feliciana Rossetti Mobility Specialist Please contact via Special educational needs teacher or  Rehab office at [phone]

## 2023-12-17 NOTE — TOC Progression Note (Signed)
Transition of Care [facility]) - Progression Note    Patient Details  Name: Jill Higgins MRN: [id number] Date of Birth: July 03, 1946  Transition of Care [facility] CM/SW Contact  Eduard Roux, [place] Phone Number: 12/17/2023, 2:03 PM  Clinical Narrative:     CSW met with patient and her daughter, [name]. Patient and family confirmed SNF choice is [facility]. CSSW contacted [facility] and they are unable to offer placement. Patent's daughter next choice is [facility] then [facility].   CSW sent referral to [facility] to review as well. Waiting on response   TOC will continue to follow and assist with discharge planning.  Antony Blackbird, MSW, LCSW Clinical Social Worker    Expected Discharge Plan: Skilled Nursing Facility Barriers to Discharge: Continued Medical Work up, SNF Pending bed offer, Insurance Authorization  Expected Discharge Plan and Services In-house Referral: Clinical Social Work Discharge Planning Services: CM Consult   Living arrangements for the past 2 months: Apartment                 DME Arranged: Vac DME Agency: [employer] Date DME Agency Contacted: 12/12/23 Time DME Agency Contacted: [phone] Representative spoke with at DME Agency: [place] HH Arranged: RN, PT, OT [facility] Agency: [facility] Date HH Agency Contacted: 12/12/23 Time HH Agency Contacted: [phone] Representative spoke with at [facility] Agency: Aggie Cosier   Social Determinants of Health (SDOH) Interventions SDOH Screenings   Food Insecurity: No Food Insecurity (12/11/2023)  Housing: Low Risk  (12/11/2023)  Transportation Needs: No Transportation Needs (12/11/2023)  Utilities: Not At Risk (12/11/2023)  Depression (PHQ2-9): Low Risk  (11/11/2020)  Social Connections: Moderately Isolated (12/11/2023)  Tobacco Use: Medium Risk (12/13/2023)    Readmission Risk Interventions    11/20/2023    3:47 PM  Readmission Risk Prevention Plan  Post Dischage Appt Complete  Medication Screening Complete   Transportation Screening Complete

## 2023-12-18 ENCOUNTER — Inpatient Hospital Stay (HOSPITAL_COMMUNITY): Payer: Medicare Other

## 2023-12-18 DIAGNOSIS — T827XXA Infection and inflammatory reaction due to other cardiac and vascular devices, implants and grafts, initial encounter: Secondary | ICD-10-CM | POA: Diagnosis not present

## 2023-12-18 DIAGNOSIS — R7881 Bacteremia: Secondary | ICD-10-CM | POA: Diagnosis not present

## 2023-12-18 DIAGNOSIS — F418 Other specified anxiety disorders: Secondary | ICD-10-CM | POA: Diagnosis not present

## 2023-12-18 DIAGNOSIS — M79674 Pain in right toe(s): Secondary | ICD-10-CM

## 2023-12-18 DIAGNOSIS — K861 Other chronic pancreatitis: Secondary | ICD-10-CM | POA: Diagnosis not present

## 2023-12-18 LAB — COMPREHENSIVE METABOLIC PANEL
ALT: 19 U/L (ref 0–44)
AST: 34 U/L (ref 15–41)
Albumin: 1.8 g/dL — ABNORMAL LOW (ref 3.5–5.0)
Alkaline Phosphatase: 74 U/L (ref 38–126)
Anion gap: 8 (ref 5–15)
BUN: 8 mg/dL (ref 8–23)
CO2: 25 mmol/L (ref 22–32)
Calcium: 8 mg/dL — ABNORMAL LOW (ref 8.9–10.3)
Chloride: 103 mmol/L (ref 98–111)
Creatinine, Ser: 0.76 mg/dL (ref 0.44–1.00)
GFR, Estimated: >60 mL/min (ref >60)
Glucose, Bld: 112 mg/dL — ABNORMAL HIGH (ref 70–99)
Potassium: 4 mmol/L (ref 3.5–5.1)
Sodium: 136 mmol/L (ref 135–145)
Total Bilirubin: 0.4 mg/dL (ref 0.0–1.2)
Total Protein: 4.9 g/dL — ABNORMAL LOW (ref 6.5–8.1)

## 2023-12-18 LAB — CBC WITH DIFFERENTIAL/PLATELET
Abs Immature Granulocytes: 0.04 10*3/uL (ref 0.00–0.07)
Basophils Absolute: 0 10*3/uL (ref 0.0–0.1)
Basophils Relative: 1 %
Eosinophils Absolute: 0.3 10*3/uL (ref 0.0–0.5)
Eosinophils Relative: 3 %
HCT: 27.6 % — ABNORMAL LOW (ref 36.0–46.0)
Hemoglobin: 9 g/dL — ABNORMAL LOW (ref 12.0–15.0)
Immature Granulocytes: 1 %
Lymphocytes Relative: 37 %
Lymphs Abs: 2.8 10*3/uL (ref 0.7–4.0)
MCH: 31.7 pg (ref 26.0–34.0)
MCHC: 32.6 g/dL (ref 30.0–36.0)
MCV: 97.2 fL (ref 80.0–100.0)
Monocytes Absolute: 0.4 10*3/uL (ref 0.1–1.0)
Monocytes Relative: 5 %
Neutro Abs: 4.1 10*3/uL (ref 1.7–7.7)
Neutrophils Relative %: 53 %
Platelets: 410 10*3/uL — ABNORMAL HIGH (ref 150–400)
RBC: 2.84 MIL/uL — ABNORMAL LOW (ref 3.87–5.11)
RDW: 14.2 % (ref 11.5–15.5)
WBC: 7.6 10*3/uL (ref 4.0–10.5)
nRBC: 0 % (ref 0.0–0.2)

## 2023-12-18 LAB — MAGNESIUM: Magnesium: 2 mg/dL (ref 1.7–2.4)

## 2023-12-18 LAB — CULTURE, BLOOD (ROUTINE X 2)

## 2023-12-18 LAB — GLUCOSE, CAPILLARY
Glucose-Capillary: 108 mg/dL — ABNORMAL HIGH (ref 70–99)
Glucose-Capillary: 191 mg/dL — ABNORMAL HIGH (ref 70–99)
Glucose-Capillary: 266 mg/dL — ABNORMAL HIGH (ref 70–99)
Glucose-Capillary: 79 mg/dL (ref 70–99)

## 2023-12-18 LAB — URIC ACID: Uric Acid, Serum: 3.6 mg/dL (ref 2.5–7.1)

## 2023-12-18 LAB — PHOSPHORUS: Phosphorus: 3.6 mg/dL (ref 2.5–4.6)

## 2023-12-18 LAB — CK: Total CK: 33 U/L — ABNORMAL LOW (ref 38–234)

## 2023-12-18 MED ORDER — KETOROLAC TROMETHAMINE 15 MG/ML IJ SOLN
15.0000 mg | Freq: Once | INTRAMUSCULAR | Status: AC
  Administered 2023-12-18: 15 mg via INTRAVENOUS
  Filled 2023-12-18: qty 1

## 2023-12-18 MED ORDER — DICLOFENAC SODIUM 1 % EX GEL
2.0000 g | Freq: Four times a day (QID) | CUTANEOUS | Status: DC
  Administered 2023-12-18 – 2023-12-19 (×3): 2 g via TOPICAL
  Filled 2023-12-18: qty 100

## 2023-12-18 MED ORDER — COLCHICINE 0.6 MG PO TABS
0.6000 mg | ORAL_TABLET | Freq: Two times a day (BID) | ORAL | Status: DC
  Administered 2023-12-18 – 2023-12-19 (×3): 0.6 mg via ORAL
  Filled 2023-12-18 (×3): qty 1

## 2023-12-18 NOTE — Progress Notes (Signed)
PROGRESS NOTE    NYEMAH WATTON  ZOX:[id number] DOB: Jun 13, 1946 DOA: 12/07/2023 PCP: Merri Brunette, MD   Brief Narrative:  LATESE DUFAULT is a 78 y.o. female with a history of hypertension, diabetes mellitus type 2, depression, anxiety, PAD with thrombosis of right extremity iliac/common femoral and disabling claudication status post axillofemoral bypass graft.  Patient presented secondary to discharge from groin incision with associated chills concerning for evidence of infection.  Patient met sepsis criteria on admission to start empiric antibiotics.  Blood cultures obtained prior to antibiotics.  Vascular surgery consulted with plan for surgical management.   **Interim History She is found to have MRSA bacteremia and ID has been consulted and has been changed to IV Dapto.  She underwent surgical intervention for her right axillofemoral bypass graft infection.  PT OT recommending SNF and was assisting with disposition now that PICC line is in and she appears medically stable and was to be discharged however he complained of significant right foot and toe pain so we will need to work this up given that she states there is hindering her ability to bear weight.  X-ray has been ordered and evaluating for gout and initiated on colchicine, diclofenac and given a dose of ketorolac.  If toe and foot pain is improved likely can be discharged to SNF in the next 24 hours given that she is otherwise medically stable.  Assessment and Plan:  Severe Sepsis -Present on admission with associated lactic acidosis.  -Secondary to graft infection.  -Blood cultures (1/17) obtained on admission and are significant for MRSA.  -Repeat CX from 1/19 showing NGTD at 5 Days -Empiric Vancomycin and Cefepime started and transitioned to Vancomycin monotherapy and now Daptomycin.  -ID consulted and appreciate evaluation recommendations and have signed off the case   Right Axillo-Femoral Bypass Graft Infection -Present on  admission. Surgery performed on 12/26.  -Vascular surgery consulted and are following.  -C/w Pain control with Acetaminophen 650 mg po/IV q6hprn Mild Pain, Oxycodone 5-10 mg po q4hprn Moderate Pain, and IV Hydromorphone 0.5 mg q4hprn -WBC Trend had improved but slightly trended up: Recent Labs  Lab 12/12/23 0324 12/13/23 1014 12/14/23 0929 12/15/23 0500 12/16/23 0307 12/17/23 0510 12/18/23 0500  WBC 9.4 12.1* 9.1 7.7 8.1 6.6 7.6  -Cefepime discontinued. Patient underwent removal of bypass on 1/19; culture significant for GPCs -Vancomycin changed to IV Daptomycin by ID -Follow-up graft culture data and it shows:  ABUNDANT METHICILLIN RESISTANT STAPHYLOCOCCUS AUREUS NO ANAEROBES ISOLATED; CULTURE IN PROGRESS FOR 5 DAYS   Report Status PENDING  Organism ID, Bacteria METHICILLIN RESISTANT STAPHYLOCOCCUS AUREUS  Resulting Agency [facility]     Susceptibility    Methicillin resistant staphylococcus aureus    MIC    CIPROFLOXACIN >=8 RESISTANT Resistant    CLINDAMYCIN <=0.25 SENS... Sensitive    ERYTHROMYCIN <=0.25 SENS... Sensitive    GENTAMICIN <=0.5 SENSI... Sensitive    Inducible Clindamycin NEGATIVE Sensitive    LINEZOLID 2 SENSITIVE Sensitive    OXACILLIN >=4 RESISTANT Resistant    RIFAMPIN <=0.5 SENSI... Sensitive    TETRACYCLINE <=1 SENSITIVE Sensitive    TRIMETH/SULFA <=10 SENSIT... Sensitive    VANCOMYCIN <=0.5 SENSI... Sensitive     -Right forearm and Hand edematous will check RUE Duplex to Evaluate for DVT -VAC was last changed today (12/17/23) Monday by WOC nurse and TOC is involved as the PT evaluation has now changed the recommendation for SNF; TOC assisting with D/C    MRSA Bacteremia -Source is likely right groin wound.  ID consulted -Patient was managed on Vancomycin. Repeat blood cultures (1/19) obtained and are no growth to date. Transthoracic Echocardiogram significant for no evidence of valvular vegetations. -ID recommendations: Vancomycin now changed to  Daptomycin on 12/11/23 (plan for 4-6 weeks), -Cardiology consulted for Transesophageal Echocardiogram which was recommended by ID and this was done today and showed now LA/LAA Thrombus or masses with EF of 60-65% -Since her blood cultures are negative she required a IJ tunneled PICC line given her anatomy. It was placed and she appeared medically stable for discharge and now had Insurance Authorization but was complaining about significant foot/toe pain prohibiting her ability to work with therapy so will work her up for Gout Pain  Right Great Toe and Foot Pain -Exquisitely tender on palpation and hindering her ability to ambulate as she only was able to ambulate 3 feet with the mobility specialist -Give her IV Ketorolac 15 mg x1 -Checked Uric Acid Level and was 3.6 -Try Topical Diclofenac  -Start Colchicine 0.6 mg po BID the first day then 0.6 mg daily after  -Check Foot X-ray and pending -May Consider Steroids if not improving  -Per Vascular Surgery they stated they will monitor her right foot symptoms and currently is not a candidate for further intervention at this moment  Right Arm Swelling -Upper extremity duplex done and showed an SVT -U/S formal read showed "No evidence of deep vein thrombosis in the upper extremity. Findings consistent with acute superficial vein thrombosis involving the right cephalic vein. Occlusive Acute SVT seen in the Cephalic Vein in the proximal upper arm." -C/w Supportive Care and Mobilize    PAD -S/p axillo-femoral bypass graft.  -Patient follows with vascular surgery as an outpatient.  -LDL is down to 15. -Continue Atorvastatin 80 mg po Daily, Aspirin 81 mg po qHS and Clopidogrel 75 mg po Daily    Primary Hypertension -Patient is on Amlodipine, Losartan and Toprol Xl as an outpatient. -Antihypertensives held on admission secondary to hypotension. -Continue to Monitor BP per Protocol -Last BP reading was 132/64   Diabetes Mellitus, Type 2 -Uncontrolled  with hyperglycemia.  -Hold home Metformin 500 mg po BID -Last hemoglobin A1C of 9.5%.  -Patient is managed on insulin glargine, insulin aspart, and metformin as an outpatient and started on SSI on admission. Blood sugar low-normal initially, now worsening. -Continue Sensitive Novolog SSI AC and  Semglee 5 units daily -CBG Trend: Recent Labs  Lab 12/17/23 0546 12/17/23 1206 12/17/23 1624 12/17/23 2117 12/18/23 0613 12/18/23 1127 12/18/23 1656  GLUCAP 159* 132* 194* 158* 108* 191* 266*    Depression and Anxiety -Continue Duloxetine 60 mg po Daily  Metabolic Acidosis -Mild and improved. CO2 is now 25, AG is 8 , Chloride Level is now 103 -Continue to Monitor and Trend and repeat CMP in the AM   Insomnia -Continue Temazepam 30 mg po qHSprn Sleep  Hypokalemia -Patient's K+ Level Trend: Recent Labs  Lab 12/12/23 0324 12/13/23 1014 12/14/23 0929 12/15/23 0500 12/16/23 0307 12/17/23 0510 12/18/23 0500  K 3.5 4.0 3.8 4.0 4.1 3.6 4.0  -Continue to Monitor and Replete as Necessary -Repeat CMP in the AM   Hyponatremia -Mild. Na+ Trend: Recent Labs  Lab 12/12/23 0324 12/13/23 1014 12/14/23 0929 12/15/23 0500 12/16/23 0307 12/17/23 0510 12/18/23 0500  NA 135 136 138 139 139 133* 136  -Continue to Monitor and Trend and Repeat CMP in the AM  Hypomagnesemia -Patient's Mag Level Trend: Recent Labs  Lab 12/12/23 0324 12/13/23 1014 12/14/23 0929 12/15/23 0500 12/16/23  0454 12/17/23 0510 12/18/23 0500  MG 1.5* 1.6* 1.8 1.5* 2.6* 1.8 2.0  -Replete with IV Mag Sulfate 2 grams yesterday  -Continue to Monitor and Replete as Necessary -Repeat Mag in the AM   ABLA and Perioperative Blood loss superimposed on Normocytic Anemia, now stable -Secondary to vascular surgery. Patient with hemoglobin down to 7.1 g/dL requiring a transfusion of 2 units of PRBC. Posttransfusion hemoglobin now stable -Hgb/Hct Trend: Recent Labs  Lab 12/12/23 0324 12/13/23 1014 12/14/23 0929  12/15/23 0500 12/16/23 0307 12/17/23 0510 12/18/23 0500  HGB 8.2* 10.1* 9.8* 9.0* 10.1* 8.7* 9.0*  HCT 24.4* 30.3* 29.3* 26.7* 30.4* 26.6* 27.6*  MCV 94.2 95.6 94.5 93.7 95.9 96.7 97.2  -Continue to Monitor for S/Sx of Bleeding; No overt bleeding noted -Repeat CBC in the AM   Hypoalbuminemia -Patient's Albumin Trending from 1.7-2.0 -Continue to Monitor and Trend and repeat CMP in the AM   Severe Protein Calorie Malnutrition in the context of Chronic illness Nutrition Status: Nutrition Problem: Severe Malnutrition Etiology: chronic illness (chronic pancreatitits) Signs/Symptoms: severe fat depletion, severe muscle depletion, energy intake < 75% for > or equal to 1 month Interventions: Boost Plus, Magic cup, MVI   DVT prophylaxis: heparin injection 5,000 Units Start: 12/12/23 0800 SCD's Start: 12/09/23 1835 SCDs Start: 12/07/23 2343    Code Status: Full Code Family Communication: No family currently at bedside  Disposition Plan:  Level of care: Progressive Status is: Inpatient Remains inpatient appropriate because: Needs further clinical improvement and anticipating discharge in next 24 hours if her foot and toe pain is improved   Consultants:  Vascular Surgery Infectious Diseases  Cardiology for TEE  Procedures:  As delineated as above   Procedure by Dr. Chestine Spore: 1.  Harvest of left arm basilic vein 2.  Redo exposure of right axillary artery 3.  Excision of right axillary artery to femoral artery bypass 4.  Vein patch angioplasty right axillary artery 5.  Vein patch angioplasty of right femoral artery distal bypass 6.  Vac right groin  Antimicrobials:  Anti-infectives (From admission, onward)    Start     Dose/Rate Route Frequency Ordered Stop   12/16/23 0000  daptomycin (CUBICIN) IVPB        500 mg Intravenous Every 24 hours 12/16/23 2118 01/01/24 2359   12/11/23 1400  DAPTOmycin (CUBICIN) IVPB 500 mg/74mL premix        500 mg 100 mL/hr over 30 Minutes  Intravenous Daily 12/11/23 0836     12/10/23 1400  DAPTOmycin (CUBICIN) 400 mg in sodium chloride 0.9 % IVPB  Status:  Discontinued        8 mg/kg  50 kg 116 mL/hr over 30 Minutes Intravenous Daily 12/10/23 1111 12/11/23 0836   12/09/23 2100  vancomycin (VANCOCIN) 750 mg in sodium chloride 0.9 % 250 mL IVPB  Status:  Discontinued        750 mg 265 mL/hr over 60 Minutes Intravenous Every 24 hours 12/09/23 1759 12/10/23 1111   12/09/23 0800  vancomycin (VANCOCIN) IVPB 1000 mg/200 mL premix  Status:  Discontinued        1,000 mg 200 mL/hr over 60 Minutes Intravenous  Once 12/09/23 0746 12/09/23 0807   12/09/23 0748  vancomycin (VANCOCIN) 1-5 GM/200ML-% IVPB       Note to Pharmacy: Crissie Sickles: cabinet override      12/09/23 0748 12/09/23 1959   12/08/23 2100  vancomycin (VANCOCIN) 750 mg in sodium chloride 0.9 % 250 mL IVPB  Status:  Discontinued  750 mg 250 mL/hr over 60 Minutes Intravenous Every 24 hours 12/07/23 2359 12/09/23 1759   12/07/23 2100  vancomycin (VANCOCIN) IVPB 1000 mg/200 mL premix        1,000 mg 200 mL/hr over 60 Minutes Intravenous  Once 12/07/23 2048 12/07/23 2313   12/07/23 2100  ceFEPIme (MAXIPIME) 2 g in sodium chloride 0.9 % 100 mL IVPB  Status:  Discontinued        2 g 200 mL/hr over 30 Minutes Intravenous Every 12 hours 12/07/23 2053 12/08/23 1519       Subjective: Seen and examined at bedside and was complaining of some exquisitely tender foot and toe pain on her right foot.  States that is causing her difficulty to bear weight due to the pain.  Also complaining about the staples from her incision is hurting.  No lightheadedness or dizziness.  Feels okay otherwise.  Denies any other concerns or complaints at this time.  Objective: Vitals:   12/18/23 1000 12/18/23 1126 12/18/23 1200 12/18/23 1656  BP: (!) 151/92 (!) 142/84 (!) 146/73 132/64  Pulse: 69 71 73 66  Resp: 11 13 17 14   Temp:  98.1 F (36.7 C)  98.2 F (36.8 C)  TempSrc:  Oral  Oral   SpO2: 97% 99% 100% 99%  Weight:      Height:        Intake/Output Summary (Last 24 hours) at 12/18/2023 1753 Last data filed at 12/18/2023 1311 Gross per 24 hour  Intake 290 ml  Output 425 ml  Net -135 ml   Filed Weights   12/07/23 2006 12/09/23 0648 12/18/23 0451  Weight: 50 kg 50 kg 47.7 kg   Examination: Physical Exam:  Constitutional: Thin chronically ill-appearing female in no acute distress complaining of some foot pain Respiratory: Diminished to auscultation bilaterally, no wheezing, rales, rhonchi or crackles. Normal respiratory effort and patient is not tachypenic. No accessory muscle use.  Unlabored breathing Cardiovascular: RRR, no murmurs / rubs / gallops. S1 and S2 auscultated.  Very slight extremity edema Abdomen: Soft, non-tender, non-distended. Bowel sounds positive.  GU: Deferred. Musculoskeletal: No clubbing / cyanosis of digits/nails. No joint deformity upper and lower extremities.  Right great toe and foot is tender to palpate Neurologic: CN 2-12 grossly intact with no focal deficits. Romberg sign and cerebellar reflexes not assessed.  Psychiatric: Normal judgment and insight. Alert and oriented x 3. Normal mood and appropriate affect.   Data Reviewed: I have personally reviewed following labs and imaging studies  CBC: Recent Labs  Lab 12/14/23 0929 12/15/23 0500 12/16/23 0307 12/17/23 0510 12/18/23 0500  WBC 9.1 7.7 8.1 6.6 7.6  NEUTROABS 5.3 4.7 4.6 4.8 4.1  HGB 9.8* 9.0* 10.1* 8.7* 9.0*  HCT 29.3* 26.7* 30.4* 26.6* 27.6*  MCV 94.5 93.7 95.9 96.7 97.2  PLT 301 302 367 368 410*   Basic Metabolic Panel: Recent Labs  Lab 12/14/23 0929 12/15/23 0500 12/16/23 0307 12/17/23 0510 12/18/23 0500  NA 138 139 139 133* 136  K 3.8 4.0 4.1 3.6 4.0  CL 106 106 109 104 103  CO2 24 24 22 23 25   GLUCOSE 126* 130* 89 212* 112*  BUN 8 8 6* 7* 8  CREATININE 0.52 0.68 0.89 0.67 0.76  CALCIUM 8.1* 8.1* 8.0* 7.6* 8.0*  MG 1.8 1.5* 2.6* 1.8 2.0  PHOS 3.5 3.8  4.4 3.4 3.6   GFR: Estimated Creatinine Clearance: 43.6 mL/min (by C-G formula based on SCr of 0.76 mg/dL). Liver Function Tests: Recent Labs  Lab  12/14/23 0929 12/15/23 0500 12/16/23 0307 12/17/23 0510 12/18/23 0500  AST 18 27 36 21 34  ALT 17 17 20 17 19   ALKPHOS 73 77 80 74 74  BILITOT 0.4 0.6 0.6 0.4 0.4  PROT 5.0* 4.8* 5.1* 4.8* 4.9*  ALBUMIN 1.9* 1.8* 2.0* 1.7* 1.8*   No results for input(s): "LIPASE", "AMYLASE" in the last 168 hours. No results for input(s): "AMMONIA" in the last 168 hours. Coagulation Profile: No results for input(s): "INR", "PROTIME" in the last 168 hours. Cardiac Enzymes: Recent Labs  Lab 12/13/23 1014 12/18/23 0500  CKTOTAL 44 33*   BNP (last 3 results) No results for input(s): "PROBNP" in the last 8760 hours. HbA1C: No results for input(s): "HGBA1C" in the last 72 hours. CBG: Recent Labs  Lab 12/17/23 1624 12/17/23 2117 12/18/23 0613 12/18/23 1127 12/18/23 1656  GLUCAP 194* 158* 108* 191* 266*   Lipid Profile: No results for input(s): "CHOL", "HDL", "LDLCALC", "TRIG", "CHOLHDL", "LDLDIRECT" in the last 72 hours. Thyroid Function Tests: No results for input(s): "TSH", "T4TOTAL", "FREET4", "T3FREE", "THYROIDAB" in the last 72 hours. Anemia Panel: No results for input(s): "VITAMINB12", "FOLATE", "FERRITIN", "TIBC", "IRON", "RETICCTPCT" in the last 72 hours. Sepsis Labs: No results for input(s): "PROCALCITON", "LATICACIDVEN" in the last 168 hours.  Recent Results (from the past 240 hours)  Surgical pcr screen     Status: Abnormal   Collection Time: 12/08/23  7:57 PM   Specimen: Nasal Mucosa; Nasal Swab  Result Value Ref Range Status   MRSA, PCR POSITIVE (A) NEGATIVE Final    Comment: RESULT CALLED TO, READ BACK BY AND VERIFIED WITH: T IRBY,RN@[id number]  12/08/23 MK    Staphylococcus aureus POSITIVE (A) NEGATIVE Final    Comment: (NOTE) The Xpert SA Assay (FDA approved for NASAL specimens in patients 90 years of age and older), is one  component of a comprehensive surveillance program. It is not intended to diagnose infection nor to guide or monitor treatment. Performed at [facility], [address] [place], [place] [address]   Culture, blood (Routine X 2) w Reflex to ID Panel     Status: None   Collection Time: 12/09/23  3:07 AM   Specimen: BLOOD RIGHT ARM  Result Value Ref Range Status   Specimen Description BLOOD RIGHT ARM  Final   Special Requests   Final    BOTTLES DRAWN AEROBIC AND ANAEROBIC Blood Culture results may not be optimal due to an inadequate volume of blood received in culture bottles   Culture   Final    NO GROWTH 5 DAYS Performed at [facility], [address], [place], [place] [address]    Report Status 12/14/2023 FINAL  Final  Culture, blood (Routine X 2) w Reflex to ID Panel     Status: None   Collection Time: 12/09/23  3:07 AM   Specimen: BLOOD RIGHT HAND  Result Value Ref Range Status   Specimen Description BLOOD RIGHT HAND  Final   Special Requests   Final    BOTTLES DRAWN AEROBIC AND ANAEROBIC Blood Culture results may not be optimal due to an inadequate volume of blood received in culture bottles   Culture   Final    NO GROWTH 5 DAYS Performed at [facility], [address] [place], [place] [address]    Report Status 12/14/2023 FINAL  Final  Aerobic/Anaerobic Culture w Gram Stain (surgical/deep wound)     Status: None   Collection Time: 12/09/23 10:16 AM   Specimen:  Wound  Result Value Ref Range Status   Specimen Description WOUND  Final   Special Requests RIGHT GROIN  Final   Gram Stain   Final    NO WBC SEEN RARE GRAM POSITIVE COCCI IN PAIRS IN SINGLES    Culture   Final    MODERATE STAPHYLOCOCCUS AUREUS SUSCEPTIBILITIES PERFORMED ON PREVIOUS CULTURE WITHIN THE LAST 5 DAYS. NO ANAEROBES ISOLATED Performed at [facility], [address], [place], [place] [address]    Report Status 12/13/2023 FINAL  Final  Aerobic/Anaerobic Culture w Gram Stain  (surgical/deep wound)     Status: None   Collection Time: 12/09/23 10:18 AM   Specimen: Wound  Result Value Ref Range Status   Specimen Description WOUND  Final   Special Requests portion of graft from right groin  Final   Gram Stain   Final    MODERATE WBC PRESENT,BOTH PMN AND MONONUCLEAR FEW GRAM POSITIVE COCCI IN PAIRS    Culture   Final    ABUNDANT METHICILLIN RESISTANT STAPHYLOCOCCUS AUREUS NO ANAEROBES ISOLATED Performed at [facility], [address] [place], [place] [address]    Report Status 12/14/2023 FINAL  Final   Organism ID, Bacteria METHICILLIN RESISTANT STAPHYLOCOCCUS AUREUS  Final      Susceptibility   Methicillin resistant staphylococcus aureus - MIC*    CIPROFLOXACIN >=8 RESISTANT Resistant     ERYTHROMYCIN <=0.25 SENSITIVE Sensitive     GENTAMICIN <=0.5 SENSITIVE Sensitive     OXACILLIN >=4 RESISTANT Resistant     TETRACYCLINE <=1 SENSITIVE Sensitive     VANCOMYCIN <=0.5 SENSITIVE Sensitive     TRIMETH/SULFA <=10 SENSITIVE Sensitive     CLINDAMYCIN <=0.25 SENSITIVE Sensitive     RIFAMPIN <=0.5 SENSITIVE Sensitive     Inducible Clindamycin NEGATIVE Sensitive     LINEZOLID 2 SENSITIVE Sensitive     * ABUNDANT METHICILLIN RESISTANT STAPHYLOCOCCUS AUREUS  Aerobic/Anaerobic Culture w Gram Stain (surgical/deep wound)     Status: None   Collection Time: 12/09/23 10:23 AM   Specimen: Wound  Result Value Ref Range Status   Specimen Description WOUND  Final   Special Requests DISTAL PORTION OF GRAFT FROM RIGHT GROIN  Final   Gram Stain   Final    RARE WBC PRESENT, PREDOMINANTLY MONONUCLEAR NO ORGANISMS SEEN    Culture   Final    FEW STAPHYLOCOCCUS AUREUS SUSCEPTIBILITIES PERFORMED ON PREVIOUS CULTURE WITHIN THE LAST 5 DAYS. NO ANAEROBES ISOLATED Performed at [facility], [address] [place], [place] [address]    Report Status 12/14/2023 FINAL  Final    Radiology Studies: No results found.  Scheduled Meds:  aspirin EC  81 mg Oral QHS    atorvastatin  80 mg Oral Daily   Chlorhexidine Gluconate Cloth  6 each Topical Daily   clopidogrel  75 mg Oral Q breakfast   colchicine  0.6 mg Oral BID   diclofenac Sodium  2 g Topical QID   docusate sodium  100 mg Oral Daily   DULoxetine  60 mg Oral Daily   heparin injection (subcutaneous)  5,000 Units Subcutaneous Q8H   insulin aspart  0-9 Units Subcutaneous TID WC   insulin glargine-yfgn  3 Units Subcutaneous Daily   lactose free nutrition  237 mL Oral BID BM   multivitamin with minerals  1 tablet Oral Daily   pantoprazole  40 mg Oral BID   sodium chloride flush  3 mL Intravenous Q12H   Continuous Infusions:  DAPTOmycin 500 mg (  12/18/23 1311)   magnesium sulfate bolus IVPB      LOS: 11 days   Marguerita Merles, DO [facility] Available via Epic secure chat 7am-7pm After these hours, please refer to coverage provider listed on [email or web address] 12/18/2023, 5:53 PM

## 2023-12-18 NOTE — TOC Progression Note (Addendum)
Transition of Care [facility]) - Progression Note    Patient Details  Name: Jill Higgins MRN: [id number] Date of Birth: [birth year]  Transition of Care [facility]) CM/SW Contact  Eduard Roux, [place] Phone Number: 12/18/2023, 10:34 AM  Clinical Narrative:     11:33 am- [facility] - informed patient will need wound vac Called [employer] - request auth for [facility] - waiting on approval   10:34 am [facility] - unable to offer for rehab Faythe Casa- sent message to [facility], waiting on response   Expected Discharge Plan: Skilled Nursing Facility Barriers to Discharge: Continued Medical Work up, SNF Pending bed offer, English as a second language teacher  Expected Discharge Plan and Services In-house Referral: Clinical Social Work Discharge Planning Services: CM Consult   Living arrangements for the past 2 months: Apartment                 DME Arranged: Vac DME Agency: [employer] Date DME Agency Contacted: 12/12/23 Time DME Agency Contacted: [phone] Representative spoke with at DME Agency: [facility] HH Arranged: RN, PT, OT [facility] Agency: [facility] Date HH Agency Contacted: 12/12/23 Time HH Agency Contacted: 1406 Representative spoke with at [facility] Agency: Aggie Cosier   Social Determinants of Health (SDOH) Interventions SDOH Screenings   Food Insecurity: No Food Insecurity (12/11/2023)  Housing: Low Risk  (12/11/2023)  Transportation Needs: No Transportation Needs (12/11/2023)  Utilities: Not At Risk (12/11/2023)  Depression (PHQ2-9): Low Risk  (11/11/2020)  Social Connections: Moderately Isolated (12/11/2023)  Tobacco Use: Medium Risk (12/13/2023)    Readmission Risk Interventions    11/20/2023    3:47 PM  Readmission Risk Prevention Plan  Post Dischage Appt Complete  Medication Screening Complete  Transportation Screening Complete

## 2023-12-18 NOTE — Progress Notes (Signed)
Mobility Specialist Progress Note:    12/18/23 0915  Mobility  Activity Transferred from bed to chair  Level of Assistance Contact guard assist, steadying assist  Assistive Device Front wheel walker  Distance Ambulated (ft) 3 ft  Activity Response Tolerated well  Mobility Referral Yes  Mobility visit 1 Mobility  Mobility Specialist Start Time (ACUTE ONLY) 0915  Mobility Specialist Stop Time (ACUTE ONLY) 0925  Mobility Specialist Time Calculation (min) (ACUTE ONLY) 10 min   Pt received in bed, agreeable to transfer B>C via RW. Tolerated well, CGA for safety. Asx throughout. Left pt in chair with all needs met, call bell and table in reach.    Feliciana Rossetti Mobility Specialist Please contact via Special educational needs teacher or  Rehab office at [phone]

## 2023-12-18 NOTE — Progress Notes (Signed)
Occupational Therapy Treatment Patient Details Name: Jill Higgins MRN: [id number] DOB: 1946-07-13 Today's Date: 12/18/2023   History of present illness The pt is a 78 yo female presenting 1/17 with drainage from R groin incision (recent R axillary - femoral bypass on 12/26). Now s/p redo R axilla - femoral artery bypass with wound vac placement 1/19. PMH includes: ight common femoral endarterectomy with right femoral to above-knee popliteal bypass, stenting of the right iliac and common femoral arteries, bronchitis, bulging disc, depression w/anxiety, DM II, fibromyalgia, GERD, glaucoma, hyperlipidemia, HTN, PVD, and sleep apnea.   OT comments  Pt making good progress towards OT goals. Pt with improving flexibility in order to manage some aspects of LB dressing without assistance today. Pt also with much improved awareness, sequencing and initiation today. Pt eager to mobilize in hallway using RW with CGA. Pt hopeful to progress quickly with rehab services to return home soon.      If plan is discharge home, recommend the following:  A little help with bathing/dressing/bathroom;A little help with walking and/or transfers;Assistance with cooking/housework;Assist for transportation;Help with stairs or ramp for entrance   Equipment Recommendations  Other (comment);BSC/3in1 (RW)    Recommendations for Other Services      Precautions / Restrictions Precautions Precautions: Fall;Other (comment) Precaution Comments: wound vac R groin Restrictions Weight Bearing Restrictions Per Provider Order: No       Mobility Bed Mobility Overal bed mobility: Needs Assistance Bed Mobility: Sit to Supine       Sit to supine: Min assist   General bed mobility comments: Min A for LE back to bed    Transfers Overall transfer level: Needs assistance Equipment used: Rolling walker (2 wheels) Transfers: Sit to/from Stand Sit to Stand: Contact guard assist                 Balance Overall  balance assessment: Needs assistance Sitting-balance support: Feet supported, No upper extremity supported Sitting balance-Leahy Scale: Good     Standing balance support: Bilateral upper extremity supported, During functional activity, Reliant on assistive device for balance Standing balance-Leahy Scale: Poor                             ADL either performed or assessed with clinical judgement   ADL Overall ADL's : Needs assistance/impaired                     Lower Body Dressing: Sitting/lateral leans;Set up Lower Body Dressing Details (indicate cue type and reason): able to reach B feet easily and don sock             Functional mobility during ADLs: Contact guard assist;Rolling walker (2 wheels)      Extremity/Trunk Assessment Upper Extremity Assessment Upper Extremity Assessment: Generalized weakness;Right hand dominant   Lower Extremity Assessment Lower Extremity Assessment: Defer to PT evaluation        Vision   Vision Assessment?: No apparent visual deficits   Perception     Praxis      Cognition Arousal: Alert Behavior During Therapy: WFL for tasks assessed/performed Overall Cognitive Status: Impaired/Different from baseline Area of Impairment: Safety/judgement, Awareness, Problem solving                         Safety/Judgement: Decreased awareness of deficits Awareness: Emergent Problem Solving: Slow processing, Requires verbal cues General Comments: pleasant, improving cognition from prior OT session. showing some  insight into deficits but decreased understanding of rehab timeline, wound vac needs/mgmt. follows all commands, improved initiation and eagerness to participate        Exercises      Shoulder Instructions       General Comments      Pertinent Vitals/ Pain       Pain Assessment Pain Assessment: Faces Faces Pain Scale: Hurts a little bit Pain Location: R toe Pain Descriptors / Indicators: Sore Pain  Intervention(s): Premedicated before session, Monitored during session  Home Living                                          Prior Functioning/Environment              Frequency  Min 1X/week        Progress Toward Goals  OT Goals(current goals can now be found in the care plan section)  Progress towards OT goals: Progressing toward goals  Acute Rehab OT Goals Patient Stated Goal: go to rehab and get better soon OT Goal Formulation: With patient Time For Goal Achievement: 12/24/23 Potential to Achieve Goals: Good ADL Goals Pt Will Perform Grooming: with supervision;with set-up;sitting Pt Will Perform Upper Body Bathing: with supervision;with set-up;sitting Pt Will Perform Lower Body Bathing: with min assist;with contact guard assist;sit to/from stand Pt Will Perform Upper Body Dressing: with supervision;with set-up;sitting Pt Will Perform Lower Body Dressing: with min assist;with contact guard assist;sit to/from stand Pt Will Transfer to Toilet: with min assist;with contact guard assist;ambulating Pt Will Perform Toileting - Clothing Manipulation and hygiene: with contact guard assist;with supervision;sit to/from stand  Plan      Co-evaluation                 AM-PAC OT "6 Clicks" Daily Activity     Outcome Measure   Help from another person eating meals?: None Help from another person taking care of personal grooming?: A Little Help from another person toileting, which includes using toliet, bedpan, or urinal?: A Little Help from another person bathing (including washing, rinsing, drying)?: A Lot Help from another person to put on and taking off regular upper body clothing?: A Little Help from another person to put on and taking off regular lower body clothing?: A Little 6 Click Score: 18    End of Session Equipment Utilized During Treatment: Gait belt;Rolling walker (2 wheels)  OT Visit Diagnosis: Pain;Unsteadiness on feet (R26.81);Other  abnormalities of gait and mobility (R26.89);Muscle weakness (generalized) (M62.81) Pain - Right/Left: Right Pain - part of body: Ankle and joints of foot   Activity Tolerance Patient tolerated treatment well   Patient Left in bed;with call bell/phone within reach;with bed alarm set;Other (comment) Engineer, building services tech entering)   Nurse Communication Mobility status        Time: [phone] OT Time Calculation (min): 32 min  Charges: OT General Charges $OT Visit: 1 Visit OT Treatments $Self Care/Home Management : 8-22 mins $Therapeutic Activity: 8-22 mins  Bradd Canary, OTR/L Acute Rehab Services Office: [phone]   Lorre Munroe 12/18/2023, 2:35 PM

## 2023-12-18 NOTE — Progress Notes (Addendum)
  Progress Note    12/18/2023 7:53 AM 5 Days Post-Op  Subjective:  no complaints   Vitals:   12/17/23 2343 12/18/23 0325  BP: 115/70 115/63  Pulse: 70 68  Resp: 10 14  Temp: 98.1 F (36.7 C) 97.7 F (36.5 C)  SpO2: 98% 98%   Physical Exam: Lungs:  non labored Incisions:  R chest incision c/d/I; R groin vac with good seal; L arm incision well healed Extremities:  brisk R DP by doppler; palpable L DP; palpable radials Neurologic: A&O  CBC    Component Value Date/Time   WBC 7.6 12/18/2023 0500   RBC 2.84 (L) 12/18/2023 0500   HGB 9.0 (L) 12/18/2023 0500   HCT 27.6 (L) 12/18/2023 0500   PLT 410 (H) 12/18/2023 0500   MCV 97.2 12/18/2023 0500   MCH 31.7 12/18/2023 0500   MCHC 32.6 12/18/2023 0500   RDW 14.2 12/18/2023 0500   LYMPHSABS 2.8 12/18/2023 0500   MONOABS 0.4 12/18/2023 0500   EOSABS 0.3 12/18/2023 0500   BASOSABS 0.0 12/18/2023 0500    BMET    Component Value Date/Time   NA 136 12/18/2023 0500   K 4.0 12/18/2023 0500   CL 103 12/18/2023 0500   CO2 25 12/18/2023 0500   GLUCOSE 112 (H) 12/18/2023 0500   BUN 8 12/18/2023 0500   CREATININE 0.76 12/18/2023 0500   CALCIUM 8.0 (L) 12/18/2023 0500   GFRNONAA >60 12/18/2023 0500   GFRAA >60 09/04/2019 0249    INR    Component Value Date/Time   INR 1.3 (H) 12/07/2023 2024     Intake/Output Summary (Last 24 hours) at 12/18/2023 0753 Last data filed at 12/18/2023 0400 Gross per 24 hour  Intake 720 ml  Output 1225 ml  Net -505 ml     Assessment/Plan:  78 y.o. female is s/p R ax fem bypass excision 5 Days Post-Op   BLE well perfused R UE well perfused with palpable radial R groin vac changed yesterday with WOC RN; TOC arranging wound vac at SNF Daptomycin via tunneled line per ID Ok for d/c to SNF when bed arranged; Office will arrange staple removal in 2 weeks   Emilie Rutter, PA-C [facility] [phone] 12/18/2023 7:53 AM  VASCULAR STAFF ADDENDUM: I have  independently interviewed and examined the patient. I agree with the above.  Will monitor R foot symptoms.  Not a candidate for further intervention at the moment.   Rande Brunt. Lenell Antu, MD [facility] Phone Number: [phone] 12/18/2023 10:10 AM

## 2023-12-18 NOTE — Progress Notes (Signed)
Nutrition Follow-up  DOCUMENTATION CODES:   Severe malnutrition in context of chronic illness  INTERVENTION:  Continue regular diet as ordered Boost Plus po BID, each supplement provides 360 kcal and 14 grams of protein Magic cup TID with meals, each supplement provides 290 kcal and 9 grams of protein MVI with minerals daily  NUTRITION DIAGNOSIS:   Severe Malnutrition related to chronic illness (chronic pancreatitits) as evidenced by severe fat depletion, severe muscle depletion, energy intake < 75% for > or equal to 1 month. - remains applicable  GOAL:   Patient will meet greater than or equal to 90% of their needs - progressing, likely meeting estimated nutrition needs via nutrition supplements and meal intake  MONITOR:   PO intake, Supplement acceptance  REASON FOR ASSESSMENT:   Consult Assessment of nutrition requirement/status  ASSESSMENT:   Pt admitted for drainage from groin incision, chills, found to have vascular graft infection and sepsis. Pt with PMH significant for HTN, DM2, depression, anxiety, PAD (common femoral stent)  Medically stable for d/c pending SNF placement.   Pt sitting up in bedside recliner at time of visit. She is in good spirits. She reports that she is consuming 3 meals per day and recalls eating at least 50% of each meal.  Limited meal completions documented throughout admission. Yesterday nursing documentation reflects pt consumed 90% breakfast, 50% lunch and 70% dinner. Breakfast today she consumed 50%. At time of visit, pt had 1 boost breeze on her table which she consumed 100% of. Assisted to open boost plus also on tray table. She recalls consuming 2 of the boost plus daily in addition to at least 1 magic cup daily.   No current nutrition related complaints. BM's appear stable with no report of diarrhea at this time.    Admit weight: 50 kg Current weight: 47.7 kg + edema: non-pitting BUE, non-pitting BLE  Medications: colace, SSI 0-9  units TID, semglee 3 units daily, MVI, protonix  Labs reviewed  CBG's 108-194 x24 hours  Diet Order:   Diet Order             Diet regular Room service appropriate? Yes; Fluid consistency: Thin  Diet effective now                   EDUCATION NEEDS:   Education needs have been addressed  Skin:  Skin Assessment: Skin Integrity Issues: Skin Integrity Issues:: Wound VAC, Incisions Incisions: right chest (closed), right groin (closed), left arm (closed)  Last BM:  1/26  Height:   Ht Readings from Last 1 Encounters:  12/09/23 5' 2.99" (1.6 m)    Weight:   Wt Readings from Last 1 Encounters:  12/18/23 47.7 kg   BMI:  Body mass index is 18.63 kg/m.  Estimated Nutritional Needs:   Kcal:  1450-1650  Protein:  75-85 g  Fluid:  > 1.5 L  Drusilla Kanner, RDN, LDN Clinical Nutrition

## 2023-12-19 DIAGNOSIS — K5900 Constipation, unspecified: Secondary | ICD-10-CM | POA: Diagnosis not present

## 2023-12-19 DIAGNOSIS — G479 Sleep disorder, unspecified: Secondary | ICD-10-CM | POA: Diagnosis not present

## 2023-12-19 DIAGNOSIS — R531 Weakness: Secondary | ICD-10-CM | POA: Diagnosis not present

## 2023-12-19 DIAGNOSIS — M6281 Muscle weakness (generalized): Secondary | ICD-10-CM | POA: Diagnosis not present

## 2023-12-19 DIAGNOSIS — Z452 Encounter for adjustment and management of vascular access device: Secondary | ICD-10-CM | POA: Diagnosis not present

## 2023-12-19 DIAGNOSIS — M79671 Pain in right foot: Secondary | ICD-10-CM | POA: Diagnosis not present

## 2023-12-19 DIAGNOSIS — E43 Unspecified severe protein-calorie malnutrition: Secondary | ICD-10-CM | POA: Diagnosis not present

## 2023-12-19 DIAGNOSIS — T827XXA Infection and inflammatory reaction due to other cardiac and vascular devices, implants and grafts, initial encounter: Secondary | ICD-10-CM | POA: Diagnosis not present

## 2023-12-19 DIAGNOSIS — T827XXD Infection and inflammatory reaction due to other cardiac and vascular devices, implants and grafts, subsequent encounter: Secondary | ICD-10-CM | POA: Diagnosis not present

## 2023-12-19 DIAGNOSIS — E785 Hyperlipidemia, unspecified: Secondary | ICD-10-CM | POA: Diagnosis not present

## 2023-12-19 DIAGNOSIS — D649 Anemia, unspecified: Secondary | ICD-10-CM | POA: Diagnosis not present

## 2023-12-19 DIAGNOSIS — I10 Essential (primary) hypertension: Secondary | ICD-10-CM | POA: Diagnosis not present

## 2023-12-19 DIAGNOSIS — I739 Peripheral vascular disease, unspecified: Secondary | ICD-10-CM | POA: Diagnosis not present

## 2023-12-19 DIAGNOSIS — R7881 Bacteremia: Secondary | ICD-10-CM | POA: Diagnosis not present

## 2023-12-19 DIAGNOSIS — E1159 Type 2 diabetes mellitus with other circulatory complications: Secondary | ICD-10-CM | POA: Diagnosis not present

## 2023-12-19 DIAGNOSIS — Z48812 Encounter for surgical aftercare following surgery on the circulatory system: Secondary | ICD-10-CM | POA: Diagnosis not present

## 2023-12-19 DIAGNOSIS — K861 Other chronic pancreatitis: Secondary | ICD-10-CM | POA: Diagnosis not present

## 2023-12-19 DIAGNOSIS — E1165 Type 2 diabetes mellitus with hyperglycemia: Secondary | ICD-10-CM | POA: Diagnosis not present

## 2023-12-19 DIAGNOSIS — B9562 Methicillin resistant Staphylococcus aureus infection as the cause of diseases classified elsewhere: Secondary | ICD-10-CM | POA: Diagnosis not present

## 2023-12-19 DIAGNOSIS — N289 Disorder of kidney and ureter, unspecified: Secondary | ICD-10-CM | POA: Diagnosis present

## 2023-12-19 DIAGNOSIS — Z7401 Bed confinement status: Secondary | ICD-10-CM | POA: Diagnosis not present

## 2023-12-19 LAB — CBC WITH DIFFERENTIAL/PLATELET
Abs Immature Granulocytes: 0.04 10*3/uL (ref 0.00–0.07)
Basophils Absolute: 0.1 10*3/uL (ref 0.0–0.1)
Basophils Relative: 1 %
Eosinophils Absolute: 0.3 10*3/uL (ref 0.0–0.5)
Eosinophils Relative: 4 %
HCT: 30 % — ABNORMAL LOW (ref 36.0–46.0)
Hemoglobin: 9.8 g/dL — ABNORMAL LOW (ref 12.0–15.0)
Immature Granulocytes: 1 %
Lymphocytes Relative: 40 %
Lymphs Abs: 3.5 10*3/uL (ref 0.7–4.0)
MCH: 31.5 pg (ref 26.0–34.0)
MCHC: 32.7 g/dL (ref 30.0–36.0)
MCV: 96.5 fL (ref 80.0–100.0)
Monocytes Absolute: 0.4 10*3/uL (ref 0.1–1.0)
Monocytes Relative: 4 %
Neutro Abs: 4.5 10*3/uL (ref 1.7–7.7)
Neutrophils Relative %: 50 %
Platelets: 501 10*3/uL — ABNORMAL HIGH (ref 150–400)
RBC: 3.11 MIL/uL — ABNORMAL LOW (ref 3.87–5.11)
RDW: 14.2 % (ref 11.5–15.5)
WBC: 8.9 10*3/uL (ref 4.0–10.5)
nRBC: 0 % (ref 0.0–0.2)

## 2023-12-19 LAB — GLUCOSE, CAPILLARY
Glucose-Capillary: 132 mg/dL — ABNORMAL HIGH (ref 70–99)
Glucose-Capillary: 107 mg/dL — ABNORMAL HIGH (ref 70–99)
Glucose-Capillary: 202 mg/dL — ABNORMAL HIGH (ref 70–99)

## 2023-12-19 LAB — COMPREHENSIVE METABOLIC PANEL
ALT: 19 U/L (ref 0–44)
AST: 27 U/L (ref 15–41)
Albumin: 2 g/dL — ABNORMAL LOW (ref 3.5–5.0)
Alkaline Phosphatase: 86 U/L (ref 38–126)
Anion gap: 9 (ref 5–15)
BUN: 9 mg/dL (ref 8–23)
CO2: 24 mmol/L (ref 22–32)
Calcium: 8.3 mg/dL — ABNORMAL LOW (ref 8.9–10.3)
Chloride: 103 mmol/L (ref 98–111)
Creatinine, Ser: 0.99 mg/dL (ref 0.44–1.00)
GFR, Estimated: 58 mL/min — ABNORMAL LOW (ref >60)
Glucose, Bld: 138 mg/dL — ABNORMAL HIGH (ref 70–99)
Potassium: 4.4 mmol/L (ref 3.5–5.1)
Sodium: 136 mmol/L (ref 135–145)
Total Bilirubin: 0.4 mg/dL (ref 0.0–1.2)
Total Protein: 5.4 g/dL — ABNORMAL LOW (ref 6.5–8.1)

## 2023-12-19 LAB — PHOSPHORUS: Phosphorus: 4 mg/dL (ref 2.5–4.6)

## 2023-12-19 LAB — MAGNESIUM: Magnesium: 1.7 mg/dL (ref 1.7–2.4)

## 2023-12-19 MED ORDER — TEMAZEPAM 30 MG PO CAPS: 30.0000 mg | ORAL_CAPSULE | Freq: Every evening | ORAL | 0 refills | Status: AC | PRN

## 2023-12-19 MED ORDER — GABAPENTIN 300 MG PO CAPS: 300.0000 mg | ORAL_CAPSULE | Freq: Three times a day (TID) | ORAL | 0 refills | Status: DC

## 2023-12-19 MED ORDER — GABAPENTIN 300 MG PO CAPS
300.0000 mg | ORAL_CAPSULE | Freq: Three times a day (TID) | ORAL | Status: DC
  Administered 2023-12-19 (×2): 300 mg via ORAL
  Filled 2023-12-19 (×3): qty 1

## 2023-12-19 MED ORDER — OXYCODONE HCL 5 MG PO TABS: 5.0000 mg | ORAL_TABLET | ORAL | 0 refills | Status: DC | PRN

## 2023-12-19 MED ORDER — LANTUS SOLOSTAR 100 UNIT/ML ~~LOC~~ SOPN: 5.0000 [IU] | PEN_INJECTOR | Freq: Every day | SUBCUTANEOUS | Status: DC

## 2023-12-19 MED ORDER — SENNOSIDES-DOCUSATE SODIUM 8.6-50 MG PO TABS: 1.0000 | ORAL_TABLET | Freq: Every day | ORAL | Status: AC

## 2023-12-19 MED ORDER — POLYETHYLENE GLYCOL 3350 17 G PO PACK: 17.0000 g | PACK | Freq: Every day | ORAL | Status: DC | PRN

## 2023-12-19 MED ORDER — HEPARIN SOD (PORK) LOCK FLUSH 100 UNIT/ML IV SOLN
250.0000 [IU] | INTRAVENOUS | Status: AC | PRN
  Administered 2023-12-19: 250 [IU]
  Filled 2023-12-19: qty 2.5

## 2023-12-19 NOTE — Progress Notes (Signed)
Called report to leslie, Charity fundraiser at [employer] place.   Lawson Radar, RN

## 2023-12-19 NOTE — Discharge Summary (Signed)
[facility]  Physician Discharge Summary   Patient ID: Jill Higgins MRN: [id number] DOB/AGE: 05/14/1946 78 y.o.  Admit date: 12/07/2023 Discharge date:   12/19/2023   PCP: Merri Brunette, MD  DISCHARGE DIAGNOSES:    Vascular graft infection, initial encounter [facility])   Hypertension   Depression with anxiety   Chronic pancreatitis (HCC)   Type 2 diabetes mellitus with vascular disease (HCC)   Peripheral arterial disease (HCC)   Sepsis (HCC)   Protein-calorie malnutrition, severe   Bacteremia   RECOMMENDATIONS FOR OUTPATIENT FOLLOW UP: Follow-up with vascular surgery in 2 weeks Continue with wound VAC as instructed by vascular surgery Monitor CBGs at skilled nursing facility Needs to be on IV daptomycin until 12/30/2023.   Home Health: Going to SNF Equipment/Devices: None  CODE STATUS: Full code  DISCHARGE CONDITION: fair  Diet recommendation: Modified carbohydrate  INITIAL HISTORY: 78 y.o. female with a history of hypertension, diabetes mellitus type 2, depression, anxiety, PAD with thrombosis of right extremity iliac/common femoral and disabling claudication status post axillofemoral bypass graft. Patient presented secondary to discharge from groin incision with associated chills concerning for evidence of infection. Patient met sepsis criteria on admission to start empiric antibiotics. Blood cultures obtained prior to antibiotics. Vascular surgery consulted with plan for surgical management    HOSPITAL COURSE:   Right Axillo-Femoral Bypass Graft Infection -Present on admission. Surgery performed on 12/26.  Stable.  Antibiotics as above. Continue with wound VAC to the right groin.   MRSA Bacteremia -Source is likely right groin wound. ID consulted. Currently on daptomycin.  End of treatment will be 04/07/2024.  Severe Sepsis -Present on admission with associated lactic acidosis.  -Secondary to graft infection.  -Blood cultures (1/17) obtained on  admission and are significant for MRSA.  -Repeat CX from 1/19 showing NGTD at 5 Days -Empiric Vancomycin and Cefepime started and transitioned to Vancomycin monotherapy and now Daptomycin.   Right Great Toe and Foot Pain Has had this pain for at least a month.  Complains of pain mainly in the first toe and some in the second toe.  No abnormality noted on physical examination.  Vascular surgery does not think that there is any vascular compromise at this time.  Gout was considered.  Her uric acid level was 3.6.  Physical examination does not suggest gout.  X-ray does not show any abnormalities.  She could be experiencing neuropathic pain.  She was on gabapentin previously.  Will resume this.  Patient was reassured.  Did not think she needs any further testing at this time.  This can be monitored at her rehab center.  If gabapentin does not improve pain then steroid course could be considered.   Right Arm Swelling -Upper extremity duplex done and showed an SVT -U/S formal read showed "No evidence of deep vein thrombosis in the upper extremity. Findings consistent with acute superficial vein thrombosis involving the right cephalic vein. Occlusive Acute SVT seen in the Cephalic Vein in the proximal upper arm." -C/w Supportive Care and Mobilize    PAD -S/p axillo-femoral bypass graft.  -Patient follows with vascular surgery as an outpatient.  -LDL is down to 15. -Continue Atorvastatin 80 mg po Daily, Aspirin 81 mg po qHS and Clopidogrel 75 mg po Daily    Primary Hypertension -Patient is on Amlodipine, Losartan and Toprol Xl as an outpatient.  -Antihypertensives held on admission secondary to hypotension.   Diabetes Mellitus, Type 2 -Uncontrolled with hyperglycemia.  Also with episodes of hypoglycemia.  HbA1c 9.5.  Insulin dosage adjusted.  Monitor CBGs.   Depression and Anxiety -Continue Duloxetine 60 mg po Daily   Metabolic Acidosis   Insomnia   Hypokalemia   Hyponatremia    Hypomagnesemia   ABLA and Perioperative Blood loss superimposed on Normocytic Anemia, now stable -Secondary to vascular surgery. Patient with hemoglobin down to 7.1 g/dL requiring a transfusion of 2 units of PRBC. Posttransfusion hemoglobin now stable   Severe Protein Calorie Malnutrition in the context of Chronic illness Nutrition Status: Nutrition Problem: Severe Malnutrition Etiology: chronic illness (chronic pancreatitits) Signs/Symptoms: severe fat depletion, severe muscle depletion, energy intake < 75% for > or equal to 1 month Interventions: Boost Plus, Magic cup, MVI    Patient is stable for the most part.  Okay for discharge to SNF.   PERTINENT LABS:  The results of significant diagnostics from this hospitalization (including imaging, microbiology, ancillary and laboratory) are listed below for reference.    Microbiology: Recent Results (from the past 240 hours)  Aerobic/Anaerobic Culture w Gram Stain (surgical/deep wound)     Status: None   Collection Time: 12/09/23 10:16 AM   Specimen: Wound  Result Value Ref Range Status   Specimen Description WOUND  Final   Special Requests RIGHT GROIN  Final   Gram Stain   Final    NO WBC SEEN RARE GRAM POSITIVE COCCI IN PAIRS IN SINGLES    Culture   Final    MODERATE STAPHYLOCOCCUS AUREUS SUSCEPTIBILITIES PERFORMED ON PREVIOUS CULTURE WITHIN THE LAST 5 DAYS. NO ANAEROBES ISOLATED Performed at [facility], [address], [place], [place] [address]    Report Status 12/13/2023 FINAL  Final  Aerobic/Anaerobic Culture w Gram Stain (surgical/deep wound)     Status: None   Collection Time: 12/09/23 10:18 AM   Specimen: Wound  Result Value Ref Range Status   Specimen Description WOUND  Final   Special Requests portion of graft from right groin  Final   Gram Stain   Final    MODERATE WBC PRESENT,BOTH PMN AND MONONUCLEAR FEW GRAM POSITIVE COCCI IN PAIRS    Culture   Final    ABUNDANT METHICILLIN RESISTANT STAPHYLOCOCCUS  AUREUS NO ANAEROBES ISOLATED Performed at [facility], [address], [place], [place] [address]    Report Status 12/14/2023 FINAL  Final   Organism ID, Bacteria METHICILLIN RESISTANT STAPHYLOCOCCUS AUREUS  Final      Susceptibility   Methicillin resistant staphylococcus aureus - MIC*    CIPROFLOXACIN >=8 RESISTANT Resistant     ERYTHROMYCIN <=0.25 SENSITIVE Sensitive     GENTAMICIN <=0.5 SENSITIVE Sensitive     OXACILLIN >=4 RESISTANT Resistant     TETRACYCLINE <=1 SENSITIVE Sensitive     VANCOMYCIN <=0.5 SENSITIVE Sensitive     TRIMETH/SULFA <=10 SENSITIVE Sensitive     CLINDAMYCIN <=0.25 SENSITIVE Sensitive     RIFAMPIN <=0.5 SENSITIVE Sensitive     Inducible Clindamycin NEGATIVE Sensitive     LINEZOLID 2 SENSITIVE Sensitive     * ABUNDANT METHICILLIN RESISTANT STAPHYLOCOCCUS AUREUS  Aerobic/Anaerobic Culture w Gram Stain (surgical/deep wound)     Status: None   Collection Time: 12/09/23 10:23 AM   Specimen: Wound  Result Value Ref Range Status   Specimen Description WOUND  Final   Special Requests DISTAL PORTION OF GRAFT FROM RIGHT GROIN  Final   Gram Stain   Final    RARE WBC PRESENT, PREDOMINANTLY MONONUCLEAR NO ORGANISMS SEEN    Culture   Final    FEW STAPHYLOCOCCUS AUREUS  SUSCEPTIBILITIES PERFORMED ON PREVIOUS CULTURE WITHIN THE LAST 5 DAYS. NO ANAEROBES ISOLATED Performed at [facility], [address], [place], [place] [address]    Report Status 12/14/2023 FINAL  Final     Labs:   Basic Metabolic Panel: Recent Labs  Lab 12/15/23 0500 12/16/23 0307 12/17/23 0510 12/18/23 0500 12/19/23 0415  NA 139 139 133* 136 136  K 4.0 4.1 3.6 4.0 4.4  CL 106 109 104 103 103  CO2 24 22 23 25 24   GLUCOSE 130* 89 212* 112* 138*  BUN 8 6* 7* 8 9  CREATININE 0.68 0.89 0.67 0.76 0.99  CALCIUM 8.1* 8.0* 7.6* 8.0* 8.3*  MG 1.5* 2.6* 1.8 2.0 1.7  PHOS 3.8 4.4 3.4 3.6 4.0   Liver Function Tests: Recent Labs  Lab 12/15/23 0500 12/16/23 0307  12/17/23 0510 12/18/23 0500 12/19/23 0415  AST 27 36 21 34 27  ALT 17 20 17 19 19   ALKPHOS 77 80 74 74 86  BILITOT 0.6 0.6 0.4 0.4 0.4  PROT 4.8* 5.1* 4.8* 4.9* 5.4*  ALBUMIN 1.8* 2.0* 1.7* 1.8* 2.0*    CBC: Recent Labs  Lab 12/15/23 0500 12/16/23 0307 12/17/23 0510 12/18/23 0500 12/19/23 0415  WBC 7.7 8.1 6.6 7.6 8.9  NEUTROABS 4.7 4.6 4.8 4.1 4.5  HGB 9.0* 10.1* 8.7* 9.0* 9.8*  HCT 26.7* 30.4* 26.6* 27.6* 30.0*  MCV 93.7 95.9 96.7 97.2 96.5  PLT 302 367 368 410* 501*   Cardiac Enzymes: Recent Labs  Lab 12/13/23 1014 12/18/23 0500  CKTOTAL 44 33*    CBG: Recent Labs  Lab 12/18/23 0613 12/18/23 1127 12/18/23 1656 12/18/23 2108 12/19/23 0607  GLUCAP 108* 191* 266* 79 132*     IMAGING STUDIES DG Foot 2 Views Right Result Date: 12/18/2023 CLINICAL DATA:  Toe pain EXAM: RIGHT FOOT - 2 VIEW COMPARISON:  None Available. FINDINGS: No acute fracture or dislocation. Chronic appearing deformity about the head of the fifth metatarsal head. Soft tissue swelling about the fifth toe. IMPRESSION: 1. Soft tissue swelling about the fifth toe. 2. Chronic appearing deformity about the head of the fifth metatarsal head. Electronically Signed   By: Minerva Fester M.D.   On: 12/18/2023 19:03   VAS [place] UPPER EXTREMITY VENOUS DUPLEX Result Date: 12/17/2023 UPPER VENOUS STUDY  Patient Name:  SUEELLEN KAYES  Date of Exam:   12/14/2023 Medical Rec #: [id number]        Accession #:    [id number] Date of Birth: August 23, 1946         Patient Gender: F Patient Age:   45 years Exam Location:  [facility] Procedure:      VAS [place] UPPER EXTREMITY VENOUS DUPLEX Referring Phys: Marguerita Merles --------------------------------------------------------------------------------  Indications: Swelling Risk Factors: None identified. Comparison Study: None. Performing Technologist: Shona Simpson  Examination Guidelines: A complete evaluation includes B-mode imaging, spectral Doppler, color Doppler, and power  Doppler as needed of all accessible portions of each vessel. Bilateral testing is considered an integral part of a complete examination. Limited examinations for reoccurring indications may be performed as noted.  Right Findings: +----------+------------+---------+-----------+----------+-------+ RIGHT     CompressiblePhasicitySpontaneousPropertiesSummary +----------+------------+---------+-----------+----------+-------+ IJV           Full       Yes       Yes                      +----------+------------+---------+-----------+----------+-------+ Subclavian    Full  Yes       Yes                      +----------+------------+---------+-----------+----------+-------+ Axillary      Full       Yes       Yes                      +----------+------------+---------+-----------+----------+-------+ Brachial      Full       Yes       Yes                      +----------+------------+---------+-----------+----------+-------+ Radial        Full       Yes       Yes                      +----------+------------+---------+-----------+----------+-------+ Ulnar         Full       Yes       Yes                      +----------+------------+---------+-----------+----------+-------+ Cephalic      None                 No      dilated   Acute  +----------+------------+---------+-----------+----------+-------+ Basilic       Full                 Yes                      +----------+------------+---------+-----------+----------+-------+  Left Findings: +----------+------------+---------+-----------+----------+-------+ LEFT      CompressiblePhasicitySpontaneousPropertiesSummary +----------+------------+---------+-----------+----------+-------+ Subclavian    Full       Yes       Yes                      +----------+------------+---------+-----------+----------+-------+  Summary:  Right: No evidence of deep vein thrombosis in the upper extremity. Findings  consistent with acute superficial vein thrombosis involving the right cephalic vein. Occlusive Acute SVT seen in the Cephalic Vein in the proximal upper arm.  Left: No evidence of thrombosis in the subclavian.  *See table(s) above for measurements and observations.  Diagnosing physician: Coral Else MD Electronically signed by Coral Else MD on 12/17/2023 at 5:42:27 PM.    Final    IR Fluoro Guide CV Line Right Result Date: 12/14/2023 INDICATION: In need of durable intravenous access for antibiotic administration. Chronic renal insufficiency. EXAM: ULTRASOUND AND FLUOROSCOPIC GUIDED PLACEMENT OF TUNNELED CENTRAL VENOUS CATHETER MEDICATIONS: None, the patient is currently admitted to the hospital receiving intravenous antibiotics. The antibiotic was given in an appropriate time interval prior to skin puncture. ANESTHESIA/SEDATION: None FLUOROSCOPY TIME:  18 seconds (1 mGy) COMPLICATIONS: None immediate. PROCEDURE: Informed written consent was obtained from the patient after a discussion of the risks, benefits, and alternatives to treatment. Questions regarding the procedure were encouraged and answered. The left neck and chest were prepped with chlorhexidine in a sterile fashion, and a sterile drape was applied covering the operative field. Maximum barrier sterile technique with sterile gowns and gloves were used for the procedure. A timeout was performed prior to the initiation of the procedure. After the overlying soft tissues were anesthetized, a small venotomy incision was created and a micropuncture kit was utilized to access the internal jugular vein. Real-time ultrasound guidance was utilized  for vascular access including the acquisition of a permanent ultrasound image documenting patency of the accessed vessel. The microwire was utilized to measure appropriate catheter length. The micropuncture sheath was exchanged for a peel-away sheath over a guidewire. A 5 [place] dual lumen tunneled central venous  catheter measuring 24 cm was tunneled in a retrograde fashion from the anterior chest wall to the venotomy incision. The catheter was then placed through the peel-away sheath with tip ultimately positioned at the superior caval-atrial junction. Final catheter positioning was confirmed and documented with a spot radiographic image. The catheter aspirates and flushes normally. The catheter was flushed with appropriate volume heparin dwells. The catheter exit site was secured with a 0-Prolene retention suture. The venotomy incision was closed with Dermabond and Steri-strips. Dressings were applied. The patient tolerated the procedure well without immediate post procedural complication. FINDINGS: After catheter placement, the tip lies within the superior cavoatrial junction. The catheter aspirates and flushes normally and is ready for immediate use. IMPRESSION: Successful placement of 24 cm dual lumen tunneled central venous catheter via the left internal jugular vein with tip terminating at the superior caval atrial junction. The catheter is ready for immediate use. Electronically Signed   By: Simonne Come M.D.   On: 12/14/2023 15:26   IR US Guide Vasc Access Right Result Date: 12/14/2023 INDICATION: In need of durable intravenous access for antibiotic administration. Chronic renal insufficiency. EXAM: ULTRASOUND AND FLUOROSCOPIC GUIDED PLACEMENT OF TUNNELED CENTRAL VENOUS CATHETER MEDICATIONS: None, the patient is currently admitted to the hospital receiving intravenous antibiotics. The antibiotic was given in an appropriate time interval prior to skin puncture. ANESTHESIA/SEDATION: None FLUOROSCOPY TIME:  18 seconds (1 mGy) COMPLICATIONS: None immediate. PROCEDURE: Informed written consent was obtained from the patient after a discussion of the risks, benefits, and alternatives to treatment. Questions regarding the procedure were encouraged and answered. The left neck and chest were prepped with chlorhexidine in a  sterile fashion, and a sterile drape was applied covering the operative field. Maximum barrier sterile technique with sterile gowns and gloves were used for the procedure. A timeout was performed prior to the initiation of the procedure. After the overlying soft tissues were anesthetized, a small venotomy incision was created and a micropuncture kit was utilized to access the internal jugular vein. Real-time ultrasound guidance was utilized for vascular access including the acquisition of a permanent ultrasound image documenting patency of the accessed vessel. The microwire was utilized to measure appropriate catheter length. The micropuncture sheath was exchanged for a peel-away sheath over a guidewire. A 5 [place] dual lumen tunneled central venous catheter measuring 24 cm was tunneled in a retrograde fashion from the anterior chest wall to the venotomy incision. The catheter was then placed through the peel-away sheath with tip ultimately positioned at the superior caval-atrial junction. Final catheter positioning was confirmed and documented with a spot radiographic image. The catheter aspirates and flushes normally. The catheter was flushed with appropriate volume heparin dwells. The catheter exit site was secured with a 0-Prolene retention suture. The venotomy incision was closed with Dermabond and Steri-strips. Dressings were applied. The patient tolerated the procedure well without immediate post procedural complication. FINDINGS: After catheter placement, the tip lies within the superior cavoatrial junction. The catheter aspirates and flushes normally and is ready for immediate use. IMPRESSION: Successful placement of 24 cm dual lumen tunneled central venous catheter via the left internal jugular vein with tip terminating at the superior caval atrial junction. The catheter is ready for immediate use. Electronically  Signed   By: Simonne Come M.D.   On: 12/14/2023 15:26   [place] EKG SITE RITE Result Date:  12/13/2023 If Site Rite image not attached, placement could not be confirmed due to current cardiac rhythm.  ECHO TEE Result Date: 12/13/2023    TRANSESOPHOGEAL ECHO REPORT   Patient Name:   GENE COLEE Date of Exam: 12/13/2023 Medical Rec #:  [id number]       Height:       63.0 in Accession #:    [id number]      Weight:       110.2 lb Date of Birth:  [birth year]        BSA:          1.501 m Patient Age:    37 years        BP:           124/54 mmHg Patient Gender: F               HR:           76 bpm. Exam Location:  Inpatient Procedure: Transesophageal Echo, Cardiac Doppler and Color Doppler Indications:     Endocarditis  History:         Patient has prior history of Echocardiogram examinations, most                  recent 09/08/2024. PAD; Risk Factors:Hypertension, Diabetes and                  Dyslipidemia.  Sonographer:     Lucendia Herrlich RCS Referring Phys:  [id number] [facility] [place] Diagnosing Phys: Chilton Si MD PROCEDURE: After discussion of the risks and benefits of a TEE, an informed consent was obtained from the patient. The transesophogeal probe was passed without difficulty through the esophogus of the patient. Imaged were obtained with the patient in a left lateral decubitus position. Sedation performed by different physician. The patient was monitored while under deep sedation. Anesthestetic sedation was provided intravenously by Anesthesiology: 90mg  of Propofol, 50mg  of Lidocaine. The patient's vital  signs; including heart rate, blood pressure, and oxygen saturation; remained stable throughout the procedure. The patient developed no complications during the procedure.  IMPRESSIONS  1. Left ventricular ejection fraction, by estimation, is 60 to 65%. The left ventricle has normal function. The left ventricle has no regional wall motion abnormalities.  2. Right ventricular systolic function is normal. The right ventricular size is normal.  3. No left atrial/left atrial appendage thrombus was  detected.  4. The mitral valve is normal in structure. Trivial mitral valve regurgitation. No evidence of mitral stenosis.  5. The aortic valve is tricuspid. Aortic valve regurgitation is not visualized. No aortic stenosis is present.  6. The inferior vena cava is normal in size with greater than 50% respiratory variability, suggesting right atrial pressure of 3 mmHg. Conclusion(s)/Recommendation(s): Normal biventricular function without evidence of hemodynamically significant valvular heart disease. No evidence of vegetation/infective endocarditis on this transesophageael echocardiogram. FINDINGS  Left Ventricle: Left ventricular ejection fraction, by estimation, is 60 to 65%. The left ventricle has normal function. The left ventricle has no regional wall motion abnormalities. The left ventricular internal cavity size was normal in size. There is  no left ventricular hypertrophy. Right Ventricle: The right ventricular size is normal. No increase in right ventricular wall thickness. Right ventricular systolic function is normal. Left Atrium: Left atrial size was normal in size. No left atrial/left atrial appendage thrombus was detected. Right Atrium: Right  atrial size was normal in size. Pericardium: There is no evidence of pericardial effusion. Mitral Valve: The mitral valve is normal in structure. Trivial mitral valve regurgitation. No evidence of mitral valve stenosis. Tricuspid Valve: The tricuspid valve is normal in structure. Tricuspid valve regurgitation is trivial. No evidence of tricuspid stenosis. Aortic Valve: The aortic valve is tricuspid. Aortic valve regurgitation is not visualized. No aortic stenosis is present. Pulmonic Valve: The pulmonic valve was normal in structure. Pulmonic valve regurgitation is trivial. No evidence of pulmonic stenosis. Aorta: The aortic root is normal in size and structure. There is minimal (Grade I) atheroma plaque involving the descending aorta. Venous: The inferior vena  cava is normal in size with greater than 50% respiratory variability, suggesting right atrial pressure of 3 mmHg. IAS/Shunts: No atrial level shunt detected by color flow Doppler. Additional Comments: Spectral Doppler performed. TRICUSPID VALVE TR Peak grad:   22.8 mmHg TR Vmax:        239.00 cm/s Chilton Si MD Electronically signed by Chilton Si MD Signature Date/Time: 12/13/2023/2:23:52 PM    Final    EP STUDY Result Date: 12/13/2023 See surgical note for result.  ECHOCARDIOGRAM COMPLETE Result Date: 12/10/2023    ECHOCARDIOGRAM REPORT   Patient Name:   DISHA COTTAM Date of Exam: 12/10/2023 Medical Rec #:  [id number]       Height:       63.0 in Accession #:    [id number]      Weight:       110.2 lb Date of Birth:  1946-02-16        BSA:          1.501 m Patient Age:    68 years        BP:           103/52 mmHg Patient Gender: F               HR:           70 bpm. Exam Location:  Inpatient Procedure: 2D Echo, Color Doppler, Cardiac Doppler, Strain Analysis and 3D Echo Indications:    Bactermia  History:        Patient has prior history of Echocardiogram examinations, most                 recent 12/31/2020. Signs/Symptoms:Dyspnea, Syncope and Chest                 Pain; Risk Factors:Dyslipidemia, Hypertension, Sleep Apnea,                 Current Smoker and Diabetes.  Sonographer:    Raeford Razor RDCS Referring Phys: [phone] SAMANTHA J RHYNE IMPRESSIONS  1. Left ventricular ejection fraction, by estimation, is 60 to 65%. Left ventricular ejection fraction by 3D volume is 58 %. The left ventricle has normal function. The left ventricle has no regional wall motion abnormalities. Left ventricular diastolic  parameters were normal. The average left ventricular global longitudinal strain is -23.3 %. The global longitudinal strain is normal.  2. Right ventricular systolic function is normal. The right ventricular size is normal. Tricuspid regurgitation signal is inadequate for assessing PA pressure.  3. The  mitral valve is normal in structure. Trivial mitral valve regurgitation. No evidence of mitral stenosis.  4. The aortic valve is grossly normal. There is mild thickening of the aortic valve. Aortic valve regurgitation is not visualized. Aortic valve sclerosis is present, with no evidence of aortic valve stenosis.  5. The inferior  vena cava is normal in size with greater than 50% respiratory variability, suggesting right atrial pressure of 3 mmHg. Conclusion(s)/Recommendation(s): No evidence of valvular vegetations on this transthoracic echocardiogram. Consider a transesophageal echocardiogram to exclude infective endocarditis if clinically indicated. FINDINGS  Left Ventricle: Left ventricular ejection fraction, by estimation, is 60 to 65%. Left ventricular ejection fraction by 3D volume is 58 %. The left ventricle has normal function. The left ventricle has no regional wall motion abnormalities. The average left ventricular global longitudinal strain is -23.3 %. The global longitudinal strain is normal. The left ventricular internal cavity size was normal in size. There is no left ventricular hypertrophy. Left ventricular diastolic parameters were normal. Right Ventricle: The right ventricular size is normal. No increase in right ventricular wall thickness. Right ventricular systolic function is normal. Tricuspid regurgitation signal is inadequate for assessing PA pressure. Left Atrium: Left atrial size was normal in size. Right Atrium: Right atrial size was normal in size. Pericardium: There is no evidence of pericardial effusion. Mitral Valve: The mitral valve is normal in structure. Trivial mitral valve regurgitation. No evidence of mitral valve stenosis. Tricuspid Valve: The tricuspid valve is normal in structure. Tricuspid valve regurgitation is trivial. No evidence of tricuspid stenosis. Aortic Valve: The aortic valve is grossly normal. There is mild thickening of the aortic valve. Aortic valve regurgitation  is not visualized. Aortic valve sclerosis is present, with no evidence of aortic valve stenosis. Aortic valve mean gradient measures 7.0 mmHg. Aortic valve peak gradient measures 13.5 mmHg. Aortic valve area, by VTI measures 1.34 cm. Pulmonic Valve: The pulmonic valve was normal in structure. Pulmonic valve regurgitation is trivial. No evidence of pulmonic stenosis. Aorta: The aortic root is normal in size and structure. Venous: The inferior vena cava is normal in size with greater than 50% respiratory variability, suggesting right atrial pressure of 3 mmHg. IAS/Shunts: No atrial level shunt detected by color flow Doppler.  LEFT VENTRICLE PLAX 2D LVIDd:         4.90 cm         Diastology LVIDs:         3.00 cm         LV e' medial:    8.38 cm/s LV PW:         0.80 cm         LV E/e' medial:  15.6 LV IVS:        0.80 cm         LV e' lateral:   14.70 cm/s LVOT diam:     1.70 cm         LV E/e' lateral: 8.9 LV SV:         54 LV SV Index:   36              2D LVOT Area:     2.27 cm        Longitudinal                                Strain                                2D Strain GLS  -23.3 %                                Avg:  3D Volume EF                                LV 3D EF:    Left                                             ventricul                                             ar                                             ejection                                             fraction                                             by 3D                                             volume is                                             58 %.                                 3D Volume EF:                                3D EF:        58 %                                LV EDV:       123 ml                                LV ESV:       52 ml                                LV SV:        71 ml RIGHT VENTRICLE             IVC RV Basal diam:  2.80 cm     IVC diam: 1.90 cm RV S prime:     17.70  cm/s TAPSE (  M-mode): 2.3 cm LEFT ATRIUM             Index        RIGHT ATRIUM          Index LA diam:        2.50 cm 1.67 cm/m   RA Area:     8.40 cm LA Vol (A2C):   48.0 ml 31.98 ml/m  RA Volume:   15.30 ml 10.19 ml/m LA Vol (A4C):   46.4 ml 30.91 ml/m LA Biplane Vol: 51.0 ml 33.97 ml/m  AORTIC VALVE AV Area (Vmax):    1.32 cm AV Area (Vmean):   1.44 cm AV Area (VTI):     1.34 cm AV Vmax:           184.00 cm/s AV Vmean:          114.000 cm/s AV VTI:            0.399 m AV Peak Grad:      13.5 mmHg AV Mean Grad:      7.0 mmHg LVOT Vmax:         107.00 cm/s LVOT Vmean:        72.400 cm/s LVOT VTI:          0.236 m LVOT/AV VTI ratio: 0.59  AORTA Ao Root diam: 2.60 cm Ao Asc diam:  3.10 cm MITRAL VALVE MV Area (PHT): 3.72 cm     SHUNTS MV Decel Time: 204 msec     Systemic VTI:  0.24 m MV E velocity: 131.00 cm/s  Systemic Diam: 1.70 cm MV A velocity: 122.00 cm/s MV E/A ratio:  1.07 Weston Brass MD Electronically signed by Weston Brass MD Signature Date/Time: 12/10/2023/10:05:55 AM    Final    VAS [place] UPPER EXT VEIN MAPPING (PRE-OP AVF) Result Date: 12/08/2023 UPPER EXTREMITY VEIN MAPPING Patient Name:  VITTORIA NOREEN  Date of Exam:   12/08/2023 Medical Rec #: [id number]        Accession #:    [id number] Date of Birth: 03/04/1946         Patient Gender: F Patient Age:   71 years Exam Location:  [facility] Procedure:      VAS [place] UPPER EXT VEIN MAPPING (PRE-OP AVF) Referring Phys: JOSHUA ROBINS --------------------------------------------------------------------------------  Indications: History of PAD; patient is pre-operative for bypass. History: Right groin purulent drainage after axillofemoral bypass. Axillofemoral          bypass 11/15/23. Stenting of right iliac and common femoral arteries          04/20/23. Right common femoral endarterectomy and right femoral to above          knee popliteal bypass with vein 06/03/2018.  Limitations: Patient constant movement, grabbing rails of bed and pulling               herself up, grabbing technologist's arm, lines and bandages Comparison Study: No prior study on file Performing Technologist: Sherren Kerns RVS  Examination Guidelines: A complete evaluation includes B-mode imaging, spectral Doppler, color Doppler, and power Doppler as needed of all accessible portions of each vessel. Bilateral testing is considered an integral part of a complete examination. Limited examinations for reoccurring indications may be performed as noted. +-----------------+-------------+----------+--------------------------------+ Right Cephalic   Diameter (cm)Depth (cm)            Findings             +-----------------+-------------+----------+--------------------------------+ Prox upper arm       0.27  0.48                                    +-----------------+-------------+----------+--------------------------------+ Mid upper arm        0.26        0.58                                    +-----------------+-------------+----------+--------------------------------+ Dist upper arm       0.26        0.35                                    +-----------------+-------------+----------+--------------------------------+ Antecubital fossa    0.44        0.28                                    +-----------------+-------------+----------+--------------------------------+ Prox forearm       0.30/0.33  1.06/0.49            branching             +-----------------+-------------+----------+--------------------------------+ Mid forearm                             not visualized and bandages/line +-----------------+-------------+----------+--------------------------------+ Dist forearm         0.15        0.27                                    +-----------------+-------------+----------+--------------------------------+ Wrist                                     not visualized and movement     +-----------------+-------------+----------+--------------------------------+ +-----------------+-------------+---------+-----------------------------------+ Right Basilic    Diameter (cm)  Depth               Findings                                               (cm)                                       +-----------------+-------------+---------+-----------------------------------+ Prox upper arm       0.32       1.20                                       +-----------------+-------------+---------+-----------------------------------+ Mid upper arm        0.26       1.28                                       +-----------------+-------------+---------+-----------------------------------+ Dist upper arm  0.45       1.43                                       +-----------------+-------------+---------+-----------------------------------+ Antecubital fossa    0.35       1.07                                       +-----------------+-------------+---------+-----------------------------------+ Prox forearm         0.23       0.41                                       +-----------------+-------------+---------+-----------------------------------+ Mid forearm                                not visualized and bandages     +-----------------+-------------+---------+-----------------------------------+ Distal forearm                                 not visualized and                                                          bandages/movement          +-----------------+-------------+---------+-----------------------------------+ Wrist                                      not visualized and movement     +-----------------+-------------+---------+-----------------------------------+ +-----------------+-------------+----------+---------------------------+ Left Cephalic    Diameter (cm)Depth (cm)         Findings            +-----------------+-------------+----------+---------------------------+ Prox upper arm       0.28        0.49                               +-----------------+-------------+----------+---------------------------+ Mid upper arm        0.24        0.52                               +-----------------+-------------+----------+---------------------------+ Dist upper arm       0.28        0.43                               +-----------------+-------------+----------+---------------------------+ Antecubital fossa                       not visualized and bandages +-----------------+-------------+----------+---------------------------+ Prox forearm       0.21/0.20  0.49/0.68          branching          +-----------------+-------------+----------+---------------------------+ Mid forearm          0.19  0.47                               +-----------------+-------------+----------+---------------------------+ Dist forearm         0.18        0.54                               +-----------------+-------------+----------+---------------------------+ +-----------------+-------------+----------+-----------------------------------+ Left Basilic     Diameter (cm)Depth (cm)             Findings               +-----------------+-------------+----------+-----------------------------------+ Prox upper arm       0.35        1.20                 origin                +-----------------+-------------+----------+-----------------------------------+ Mid upper arm        0.31        1.34                                       +-----------------+-------------+----------+-----------------------------------+ Dist upper arm       0.37        1.00                                       +-----------------+-------------+----------+-----------------------------------+ Antecubital fossa                               not visualized and                                                         movement/grabbing tech        +-----------------+-------------+----------+-----------------------------------+ Prox forearm                                    not visualized and                                                        movement/grabbing tech        +-----------------+-------------+----------+-----------------------------------+ Mid forearm                                     not visualized and                                                        movement/grabbing tech        +-----------------+-------------+----------+-----------------------------------+ Distal forearm  not visualized and                                                        movement/grabbing tech        +-----------------+-------------+----------+-----------------------------------+ Wrist                                           not visualized and                                                        movement/grabbing tech        +-----------------+-------------+----------+-----------------------------------+ *See table(s) above for measurements and observations.  Diagnosing physician: Sherald Hess MD Electronically signed by Sherald Hess MD on 12/08/2023 at 3:22:28 PM.    Final    CT Angio Chest/Abd/Pel for Dissection W and/or Wo Contrast Result Date: 12/07/2023 CLINICAL DATA:  Acute aortic syndrome, peripheral vascular disease status post vascular bypass grafting, incisional drainage EXAM: CT ANGIOGRAPHY CHEST, ABDOMEN AND PELVIS TECHNIQUE: Non-contrast CT of the chest was initially obtained. Multidetector CT imaging through the chest, abdomen and pelvis was performed using the standard protocol during bolus administration of intravenous contrast. Multiplanar reconstructed images and MIPs were obtained and reviewed to evaluate the vascular anatomy. RADIATION DOSE REDUCTION: This exam was performed according to the departmental  dose-optimization program which includes automated exposure control, adjustment of the mA and/or kV according to patient size and/or use of iterative reconstruction technique. CONTRAST:  80mL OMNIPAQUE IOHEXOL 350 MG/ML SOLN COMPARISON:  None Available. FINDINGS: CTA CHEST FINDINGS Cardiovascular: Extensive multi-vessel coronary artery calcification. Global cardiac size within normal limits. No pericardial effusion. Central pulmonary arteries are of normal caliber. Mild atherosclerotic calcification within the thoracic aorta. No aortic aneurysm. Right axillofemoral bypass grafting has been performed with wide patency of the bypass graft. Mild perigraft infiltration and trace fluid is nonspecific and may be postsurgical in nature. The distal anastomosis of the graft in folds a patent right femoral-distal bypass graft which is incompletely visualized on this examination. The visualized portion the graft is widely patent as is the distal anastomosis. There focal dissection at the femoral anastomosis, best seen on sagittal image # 49/11, above the level of the profundus femoral artery origin. The visualized profundus femoral artery is widely patent. Mediastinum/Nodes: No enlarged mediastinal, hilar, or axillary lymph nodes. Thyroid gland, trachea, and esophagus demonstrate no significant findings. Lungs/Pleura: Minimal biapical scarring. Lungs are otherwise clear. No pneumothorax or pleural effusion. No central obstructing lesion. Musculoskeletal: No chest wall abnormality. No acute or significant osseous findings. Review of the MIP images confirms the above findings. CTA ABDOMEN AND PELVIS FINDINGS VASCULAR Aorta: Moderate atherosclerotic calcification. No aneurysm or dissection. No periaortic infiltration or fluid collections identified. 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 Distally widely patent without aneurysm or dissection. SMA: No hemodynamically significant stenosis. No dissection or  aneurysm. Renals: Less than 50% stenosis of the right renal artery proximally. Wide patency of the left renal artery. Normal vascular morphology. No aneurysm or dissection. IMA: Patent without evidence of aneurysm, dissection, vasculitis  or significant stenosis. Inflow: Extensive mixed atherosclerotic plaque with chronic occlusion of the right external iliac artery. Internal iliac arteries are diseased but patent at their origins. Left lower extremity arterial inflow is diseased but without evidence of focal hemodynamically significant stenosis. Surgical changes of left femoral distal bypass grafting with wide patency of the bypass graft is identified. Veins: No obvious venous abnormality within the limitations of this arterial phase study. Review of the MIP images confirms the above findings. NON-VASCULAR Hepatobiliary: No focal liver abnormality is seen. Status post cholecystectomy. No biliary dilatation. 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 Normal enhancement the residual body and tail the pancreas. No pancreatic ductal dilation. No peripancreatic fluid collections. Spleen: Unremarkable Adrenals/Urinary Tract: Adrenal glands are unremarkable. Kidneys are normal, without renal calculi, focal lesion, or hydronephrosis. Bladder is unremarkable. Stomach/Bowel: Stomach is within normal limits. Appendix is not visualized and is likely absent. No evidence of bowel wall thickening, distention, or inflammatory changes. Lymphatic: There is shotty right inguinal adenopathy, nonspecific, possibly reactive in nature. Reproductive: Uterus and bilateral adnexa are unremarkable. Other: Asymmetric subcutaneous edema within the right chest abdominal wall may be postsurgical in nature. Musculoskeletal: Osseous structures are age-appropriate. No acute bone abnormality. No lytic or blastic bone lesion. Review of the MIP images confirms the above findings. IMPRESSION: 1. Surgical changes of right  axillofemoral bypass grafting with wide patency of the bypass graft. Mild perigraft infiltration and trace fluid is nonspecific and may be postsurgical in nature. 2. Incomplete visualization of the right femoral-distal bypass graft. The visualized portion of the graft is widely patent. Focal dissection at the femoral anastomosis, above the level of the profundus femoral artery origin. The visualized profundus femoral artery is widely patent. 3. Surgical changes of left femoral distal bypass grafting with wide patency of the visualize bypass graft. 4. Extensive multi-vessel coronary artery calcification. 5. 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 This is of questionable clinical significance given patency of the additional mesenteric vasculature. 6. 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 Aortic Atherosclerosis (ICD10-I70.0). Electronically Signed   By: Helyn Numbers M.D.   On: 12/07/2023 23:07   DG Chest 2 View Result Date: 12/07/2023 CLINICAL DATA:  Recent vascular surgery, drainage from incision, concern for sepsis EXAM: CHEST - 2 VIEW COMPARISON:  09/01/2019 FINDINGS: Frontal and lateral views of the chest demonstrate an unremarkable cardiac silhouette. No acute airspace disease, effusion, or pneumothorax. No acute bony abnormalities. IMPRESSION: 1. No acute intrathoracic process. Electronically Signed   By: Sharlet Salina M.D.   On: 12/07/2023 20:39    DISCHARGE EXAMINATION: Vitals:   12/18/23 2105 12/19/23 0001 12/19/23 0400 12/19/23 0744  BP: 120/73 116/66 (!) 135/58 93/67  Pulse: 69 73 80 72  Resp: 12 10 15 18   Temp: 97.9 F (36.6 C) 98.7 F (37.1 C) 98.8 F (37.1 C) 98.7 F (37.1 C)  TempSrc: Oral Axillary Axillary Oral  SpO2: 99% 99% 100% 100%  Weight:      Height:       General appearance: Awake alert.  In no distress Resp: Clear to auscultation bilaterally.  Normal effort Cardio: S1-S2 is normal regular.  No S3-S4.  No rubs murmurs or  bruit GI: Abdomen is soft.  Nontender nondistended.  Bowel sounds are present normal.  No masses organomegaly Extremities: No swelling erythema noted over the foot area especially over the first and second toe region. Neurologic: Alert and oriented x3.  No focal neurological deficits.    DISPOSITION: SNF  Discharge  Instructions     [facility] pharmacist to adjust dose for Vancomycin, Aminoglycosides and other anti-infective therapies as requested by physician.   Complete by: As directed    [facility] to provide Cath Flo 2mg    Complete by: As directed    Administer for PICC line occlusion and as ordered by physician for other access device issues.   Anaphylaxis Kit: Provided to treat any anaphylactic reaction to the medication being provided to the patient if First Dose or when requested by physician   Complete by: As directed    Epinephrine 1mg /ml vial / amp: Administer 0.3mg  (0.19ml) subcutaneously once for moderate to severe anaphylaxis, nurse to call physician and pharmacy when reaction occurs and call 911 if needed for immediate care   Diphenhydramine 50mg /ml IV vial: Administer 25-50mg  IV/IM PRN for first dose reaction, rash, itching, mild reaction, nurse to call physician and pharmacy when reaction occurs   Sodium Chloride 0.9% NS IV: Administer if needed for hypovolemic blood pressure drop or as ordered by physician after call to physician with anaphylactic reaction   Call MD for:  difficulty breathing, headache or visual disturbances   Complete by: As directed    Call MD for:  extreme fatigue   Complete by: As directed    Call MD for:  persistant dizziness or light-headedness   Complete by: As directed    Call MD for:  persistant nausea and vomiting   Complete by: As directed    Call MD for:  severe uncontrolled pain   Complete by: As directed    Call MD for:  temperature >100.4   Complete by: As directed    Change dressing on IV access line weekly  and PRN   Complete by: As directed    Discharge instructions   Complete by: As directed    Continue wound VAC as per vascular surgery instructions.  Follow-up to be arranged by vascular surgery.  Please review instructions on the discharge summary.  You were cared for by a hospitalist during your hospital stay. If you have any questions about your discharge medications or the care you received while you were in the hospital after you are discharged, you can call the unit and asked to speak with the hospitalist on call if the hospitalist that took care of you is not available. Once you are discharged, your primary care physician will handle any further medical issues. Please note that NO REFILLS for any discharge medications will be authorized once you are discharged, as it is imperative that you return to your primary care physician (or establish a relationship with a primary care physician if you do not have one) for your aftercare needs so that they can reassess your need for medications and monitor your lab values. If you do not have a primary care physician, you can call [phone] for a physician referral.   Discharge wound care:   Complete by: As directed    Negative pressure wound therapy  Every Mon-Wed-Fri   Flush IV access with Sodium Chloride 0.9% and Heparin 10 units/ml or 100 units/ml   Complete by: As directed    Home infusion instructions - [facility]   Complete by: As directed    Instructions: Flush IV access with Sodium Chloride 0.9% and Heparin 10units/ml or 100units/ml   Change dressing on IV access line: Weekly and PRN   Instructions Cath Flo 2mg : Administer for PICC Line occlusion and as ordered by physician for other access device  [facility] pharmacist to adjust dose for: Vancomycin, Aminoglycosides and other anti-infective therapies as requested by physician   Increase activity slowly   Complete by: As directed    Method of administration may be  changed at the discretion of home infusion pharmacist based upon assessment of the patient and/or caregiver's ability to self-administer the medication ordered   Complete by: As directed          Allergies as of 12/19/2023       Reactions   Wound Dressing Adhesive Rash, Other (See Comments)   Pt states that tape and electrodes leave red "scars" on her skin and her skin is very sensitive.  Paper tape ok to use        Medication List     STOP taking these medications    amLODipine 5 MG tablet Commonly known as: NORVASC   diphenhydramine-acetaminophen 25-500 MG Tabs tablet Commonly known as: TYLENOL PM   gabapentin 800 MG tablet Commonly known as: NEURONTIN Replaced by: gabapentin 300 MG capsule   losartan 50 MG tablet Commonly known as: COZAAR   metoprolol succinate 100 MG 24 hr tablet Commonly known as: TOPROL-XL   OVER THE COUNTER MEDICATION   traMADol 50 MG tablet Commonly known as: ULTRAM       TAKE these medications    aspirin EC 81 MG tablet Take 81 mg by mouth at bedtime.   atorvastatin 80 MG tablet Commonly known as: Lipitor Take 1 tablet (80 mg total) by mouth daily. What changed: when to take this   Centrum Silver 50+Women Tabs Take 1 tablet by mouth daily.   clopidogrel 75 MG tablet Commonly known as: PLAVIX Take 1 tablet (75 mg total) by mouth daily with breakfast.   daptomycin IVPB Commonly known as: CUBICIN Inject 500 mg into the vein daily for 16 days. Indication:  MRSA bacteremia/graft infection  First Dose: Yes Last Day of Therapy:  12/30/23  Labs - Once weekly:  CBC/D, BMP, and CPK Labs - Once weekly: ESR and CRP Method of administration: IV Push Method of administration may be changed at the discretion of home infusion pharmacist based upon assessment of the patient and/or caregiver's ability to self-administer the medication ordered.   diclofenac Sodium 1 % Gel Commonly known as: VOLTAREN Apply 1 application. topically daily as  needed (leg pain).   DULoxetine 60 MG capsule Commonly known as: CYMBALTA Take 60 mg by mouth daily.   ferrous sulfate 325 (65 FE) MG EC tablet Take 325 mg by mouth daily.   FreeStyle Libre 3 Plus Sensor Misc Inject 1 Device into the skin continuous. Change every 15 days   FreeStyle Libre 3 Reader [place] Use to monitor glucose continuously.   gabapentin 300 MG capsule Commonly known as: NEURONTIN Take 1 capsule (300 mg total) by mouth 3 (three) times daily. Replaces: gabapentin 800 MG tablet   Insulin Pen Needle 32G X 4 MM Misc Use 4x a day   Lantus SoloStar 100 UNIT/ML Solostar Pen Generic drug: insulin glargine Inject 5 Units into the skin at bedtime. What changed: how much to take   latanoprost 0.005 % ophthalmic solution Commonly known as: XALATAN Place 1 drop into both eyes at bedtime.   MAGNESIUM PO Take 1 tablet by mouth daily.   metFORMIN 500 MG 24 hr tablet Commonly known as: GLUCOPHAGE-XR Take 1 tablet (500 mg total) by mouth 2 (two) times daily with a meal.   NovoLOG FlexPen 100 UNIT/ML FlexPen Generic drug: insulin aspart 4-8 units  with every meal   oxybutynin 10 MG 24 hr tablet Commonly known as: DITROPAN-XL Take 10 mg by mouth daily.   oxyCODONE 5 MG immediate release tablet Commonly known as: Oxy IR/ROXICODONE Take 1-2 tablets (5-10 mg total) by mouth every 4 (four) hours as needed for moderate pain (pain score 4-6).   Pancrelipase (Lip-Prot-Amyl) 24000-76000 units Cpep Take 1 capsule (24,000 Units total) by mouth 3 (three) times daily before meals. What changed:  how much to take when to take this additional instructions   pantoprazole 40 MG tablet Commonly known as: PROTONIX Take 40 mg by mouth every evening.   polyethylene glycol 17 g packet Commonly known as: MIRALAX / GLYCOLAX Take 17 g by mouth daily as needed for mild constipation.   senna-docusate 8.6-50 MG tablet Commonly known as: Senokot-S Take 1 tablet by mouth daily.    temazepam 30 MG capsule Commonly known as: RESTORIL Take 1 capsule (30 mg total) by mouth at bedtime as needed for sleep.   timolol 0.5 % ophthalmic solution Commonly known as: TIMOPTIC Place 1 drop into both eyes every morning.   traZODone 50 MG tablet Commonly known as: DESYREL Take 75 mg by mouth at bedtime as needed for sleep.               Discharge Care Instructions  (From admission, onward)           Start     Ordered   12/19/23 0000  Discharge wound care:       Comments: Negative pressure wound therapy  Every Mon-Wed-Fri   12/19/23 0922   12/16/23 0000  Change dressing on IV access line weekly and PRN  (Home infusion instructions - [facility] )        12/16/23 2118              Follow-up Information     [facility] at [facility] Follow up in 2 week(s).   Specialty: Vascular Surgery Why: sen Contact information: [address] [place] [address] [phone]                TOTAL DISCHARGE TIME: 35 minutes  Nellie Pester Rito Ehrlich  [facility] Pager on [email or web address]  12/19/2023, 9:36 AM

## 2023-12-19 NOTE — TOC Transition Note (Addendum)
Transition of Care [facility]) - Discharge Note   Patient Details  Name: Jill Higgins MRN: [id number] Date of Birth: 12-Jun-1946  Transition of Care [facility]) CM/SW Contact:  Eduard Roux, LCSW Phone Number: 12/19/2023, 11:56 AM   Clinical Narrative:     Patient will Discharge to: [facility]  Discharge Date: 12/19/23 Family Notified: daughter Transport [id number] @3pm   Per MD patient is ready for discharge. RN, patient, and facility notified of discharge. Discharge Summary sent to facility. RN given number for [phone], Rm [id number]. Ambulance transport requested for patient.   Clinical Social Worker signing off. Antony Blackbird, MSW, LCSW Clinical Social Worker     Final next level of care: Skilled Nursing Facility Barriers to Discharge: Barriers Resolved   Patient Goals and CMS Choice Patient states their goals for this hospitalization and ongoing recovery are:: to get stronger and heal CMS Medicare.gov Compare Post Acute Care list provided to:: Patient Choice offered to / list presented to : Patient      Discharge Placement              Patient chooses bed at:  [facility]) Patient to be transferred to facility by: [facility] Name of family member notified: daughter Patient and family notified of of transfer: 12/19/23  Discharge Plan and Services Additional resources added to the After Visit Summary for   In-house Referral: Clinical Social Work Discharge Planning Services: CM Consult            DME Arranged: [facility] [place] DME Agency: [facility] Date DME Agency Contacted: 12/12/23 Time DME Agency Contacted: [phone] Representative spoke with at DME Agency: [facility] HH Arranged: RN, PT, OT [facility] Agency: [facility] Date HH Agency Contacted: 12/12/23 Time HH Agency Contacted: [phone] Representative spoke with at [facility] Agency: Aggie Cosier  Social Drivers of Health (SDOH) Interventions SDOH Screenings   Food Insecurity: No Food Insecurity (12/11/2023)  Housing: Low Risk   (12/11/2023)  Transportation Needs: No Transportation Needs (12/11/2023)  Utilities: Not At Risk (12/11/2023)  Depression (PHQ2-9): Low Risk  (11/11/2020)  Social Connections: Moderately Isolated (12/11/2023)  Tobacco Use: Medium Risk (12/13/2023)     Readmission Risk Interventions    11/20/2023    3:47 PM  Readmission Risk Prevention Plan  Post Dischage Appt Complete  Medication Screening Complete  Transportation Screening Complete

## 2023-12-19 NOTE — Progress Notes (Signed)
  Progress Note    12/19/2023 7:51 AM 6 Days Post-Op  Subjective:  no complaints   Vitals:   12/19/23 0400 12/19/23 0744  BP: (!) 135/58 93/67  Pulse: 80 72  Resp: 15 13  Temp: 98.8 F (37.1 C) 98.7 F (37.1 C)  SpO2: 100% 100%   Physical Exam: Lungs:  non labored Incisions:  R chest healing well; L arm vein harvest well healed; R groin vac with good seal Extremities:  R foot warm with dopplerable DP; palpable R radial Neurologic: A&O  CBC    Component Value Date/Time   WBC 8.9 12/19/2023 0415   RBC 3.11 (L) 12/19/2023 0415   HGB 9.8 (L) 12/19/2023 0415   HCT 30.0 (L) 12/19/2023 0415   PLT 501 (H) 12/19/2023 0415   MCV 96.5 12/19/2023 0415   MCH 31.5 12/19/2023 0415   MCHC 32.7 12/19/2023 0415   RDW 14.2 12/19/2023 0415   LYMPHSABS 3.5 12/19/2023 0415   MONOABS 0.4 12/19/2023 0415   EOSABS 0.3 12/19/2023 0415   BASOSABS 0.1 12/19/2023 0415    BMET    Component Value Date/Time   NA 136 12/19/2023 0415   K 4.4 12/19/2023 0415   CL 103 12/19/2023 0415   CO2 24 12/19/2023 0415   GLUCOSE 138 (H) 12/19/2023 0415   BUN 9 12/19/2023 0415   CREATININE 0.99 12/19/2023 0415   CALCIUM 8.3 (L) 12/19/2023 0415   GFRNONAA 58 (L) 12/19/2023 0415   GFRAA >60 09/04/2019 0249    INR    Component Value Date/Time   INR 1.3 (H) 12/07/2023 2024     Intake/Output Summary (Last 24 hours) at 12/19/2023 0751 Last data filed at 12/19/2023 0500 Gross per 24 hour  Intake 290 ml  Output 550 ml  Net -260 ml     Assessment/Plan:  78 y.o. female is s/p R ax-fem bypass excision 6 Days Post-Op   R foot pain however foot is warm to touch with dopplerable DP Ok for d/c to SNF with R groin wound vac changes at least 2x/week Office will arrange follow up in 2 weeks   Emilie Rutter, PA-C [facility] [phone] 12/19/2023 7:51 AM

## 2023-12-19 NOTE — Progress Notes (Signed)
Mobility Specialist Progress Note:    12/19/23 0845  Mobility  Activity Transferred from bed to chair  Level of Assistance Contact guard assist, steadying assist  Assistive Device Front wheel walker  Distance Ambulated (ft) 4 ft  Activity Response Tolerated well  Mobility Referral Yes  Mobility visit 1 Mobility  Mobility Specialist Start Time (ACUTE ONLY) 0845  Mobility Specialist Stop Time (ACUTE ONLY) 0855  Mobility Specialist Time Calculation (min) (ACUTE ONLY) 10 min   Pt received in bed, agreeable to transfer to chair. CGA for safety via RW. Tolerated well c/o sharp right foot pain, compared it to a tooth ache. Sitting comfortably in chair with all needs met, call bell in reach.   Jill Higgins Mobility Specialist Please contact via Special educational needs teacher or  Rehab office at [phone]

## 2023-12-21 DIAGNOSIS — M79671 Pain in right foot: Secondary | ICD-10-CM | POA: Diagnosis not present

## 2023-12-21 DIAGNOSIS — E1165 Type 2 diabetes mellitus with hyperglycemia: Secondary | ICD-10-CM | POA: Diagnosis not present

## 2023-12-21 DIAGNOSIS — T827XXA Infection and inflammatory reaction due to other cardiac and vascular devices, implants and grafts, initial encounter: Secondary | ICD-10-CM | POA: Diagnosis not present

## 2023-12-21 DIAGNOSIS — I739 Peripheral vascular disease, unspecified: Secondary | ICD-10-CM | POA: Diagnosis not present

## 2023-12-21 DIAGNOSIS — R7881 Bacteremia: Secondary | ICD-10-CM | POA: Diagnosis not present

## 2023-12-21 DIAGNOSIS — I10 Essential (primary) hypertension: Secondary | ICD-10-CM | POA: Diagnosis not present

## 2023-12-21 DIAGNOSIS — B9562 Methicillin resistant Staphylococcus aureus infection as the cause of diseases classified elsewhere: Secondary | ICD-10-CM | POA: Diagnosis not present

## 2023-12-24 DIAGNOSIS — R7881 Bacteremia: Secondary | ICD-10-CM | POA: Diagnosis not present

## 2023-12-24 DIAGNOSIS — M79671 Pain in right foot: Secondary | ICD-10-CM | POA: Diagnosis not present

## 2023-12-24 DIAGNOSIS — I10 Essential (primary) hypertension: Secondary | ICD-10-CM | POA: Diagnosis not present

## 2023-12-24 DIAGNOSIS — E1165 Type 2 diabetes mellitus with hyperglycemia: Secondary | ICD-10-CM | POA: Diagnosis not present

## 2023-12-24 DIAGNOSIS — B9562 Methicillin resistant Staphylococcus aureus infection as the cause of diseases classified elsewhere: Secondary | ICD-10-CM | POA: Diagnosis not present

## 2023-12-24 DIAGNOSIS — I739 Peripheral vascular disease, unspecified: Secondary | ICD-10-CM | POA: Diagnosis not present

## 2023-12-24 DIAGNOSIS — T827XXA Infection and inflammatory reaction due to other cardiac and vascular devices, implants and grafts, initial encounter: Secondary | ICD-10-CM | POA: Diagnosis not present

## 2023-12-25 ENCOUNTER — Encounter: Payer: Medicare HMO | Admitting: Vascular Surgery

## 2023-12-26 ENCOUNTER — Telehealth: Payer: Self-pay

## 2023-12-26 DIAGNOSIS — B9562 Methicillin resistant Staphylococcus aureus infection as the cause of diseases classified elsewhere: Secondary | ICD-10-CM | POA: Diagnosis not present

## 2023-12-26 DIAGNOSIS — I739 Peripheral vascular disease, unspecified: Secondary | ICD-10-CM | POA: Diagnosis not present

## 2023-12-26 DIAGNOSIS — T827XXA Infection and inflammatory reaction due to other cardiac and vascular devices, implants and grafts, initial encounter: Secondary | ICD-10-CM | POA: Diagnosis not present

## 2023-12-26 DIAGNOSIS — R7881 Bacteremia: Secondary | ICD-10-CM | POA: Diagnosis not present

## 2023-12-26 NOTE — Telephone Encounter (Signed)
 Triage Call/HH :  -Received call from [facility] requesting her negative pressure therapy be changed to something else that the patient can handle at home because she is discharging on the 9th.   -On chart review, pt discharge with wound vac therapy until follow-up with staple removal.  Pt follow-up is on 02/19.  -Consulted with PA who recommends she discharge with [facility] and continue with therapy as planned.   -LM for [employer]

## 2023-12-27 ENCOUNTER — Other Ambulatory Visit (HOSPITAL_COMMUNITY): Payer: Self-pay | Admitting: Nurse Practitioner

## 2023-12-27 ENCOUNTER — Telehealth: Payer: Self-pay

## 2023-12-27 DIAGNOSIS — N289 Disorder of kidney and ureter, unspecified: Secondary | ICD-10-CM

## 2023-12-27 NOTE — Telephone Encounter (Signed)
 Triage/HH:  Cathlene from [facility] called inquiring about if pt can go home w/o wound vac. -spoke to [place] and reported the pt needs to continue the wound vac until she comes for her follow-up currently scheduled for 02/19 -confirms understanding

## 2023-12-28 DIAGNOSIS — T827XXA Infection and inflammatory reaction due to other cardiac and vascular devices, implants and grafts, initial encounter: Secondary | ICD-10-CM | POA: Diagnosis not present

## 2023-12-28 DIAGNOSIS — R7881 Bacteremia: Secondary | ICD-10-CM | POA: Diagnosis not present

## 2023-12-28 DIAGNOSIS — B9562 Methicillin resistant Staphylococcus aureus infection as the cause of diseases classified elsewhere: Secondary | ICD-10-CM | POA: Diagnosis not present

## 2023-12-28 DIAGNOSIS — E1165 Type 2 diabetes mellitus with hyperglycemia: Secondary | ICD-10-CM | POA: Diagnosis not present

## 2023-12-31 ENCOUNTER — Ambulatory Visit: Payer: Medicare Other | Admitting: Internal Medicine

## 2023-12-31 ENCOUNTER — Telehealth: Payer: Self-pay

## 2023-12-31 NOTE — Telephone Encounter (Signed)
 Ardelia Beau at [facility] called asking if fax had been signed.  Reviewed pt's chart, returned call for clarification, two identifiers used. She faxed some HH orders that need Dr. Edgardo Goodwill to sign off on. Informed her that Jim Motts would be taking care of that and he is in the office tomorrow. She also stated that the wound vac fell, broke, and is no longer working. They are working on ordering a new one, but it may be a few days. Instructed her to do a wet-to-dry dressing, which she stated they began. She stated the area is very small. Pt has a post-op staple removal on Wed, 2/12 and will be reassessed at that time. Confirmed understanding.

## 2024-01-01 DIAGNOSIS — E43 Unspecified severe protein-calorie malnutrition: Secondary | ICD-10-CM | POA: Diagnosis not present

## 2024-01-01 DIAGNOSIS — K861 Other chronic pancreatitis: Secondary | ICD-10-CM | POA: Diagnosis not present

## 2024-01-01 DIAGNOSIS — D649 Anemia, unspecified: Secondary | ICD-10-CM | POA: Diagnosis not present

## 2024-01-01 DIAGNOSIS — Z48812 Encounter for surgical aftercare following surgery on the circulatory system: Secondary | ICD-10-CM | POA: Diagnosis not present

## 2024-01-01 DIAGNOSIS — B9562 Methicillin resistant Staphylococcus aureus infection as the cause of diseases classified elsewhere: Secondary | ICD-10-CM | POA: Diagnosis not present

## 2024-01-01 DIAGNOSIS — I10 Essential (primary) hypertension: Secondary | ICD-10-CM | POA: Diagnosis not present

## 2024-01-01 DIAGNOSIS — I739 Peripheral vascular disease, unspecified: Secondary | ICD-10-CM | POA: Diagnosis not present

## 2024-01-01 DIAGNOSIS — M6281 Muscle weakness (generalized): Secondary | ICD-10-CM | POA: Diagnosis not present

## 2024-01-01 DIAGNOSIS — T827XXD Infection and inflammatory reaction due to other cardiac and vascular devices, implants and grafts, subsequent encounter: Secondary | ICD-10-CM | POA: Diagnosis not present

## 2024-01-01 DIAGNOSIS — E1159 Type 2 diabetes mellitus with other circulatory complications: Secondary | ICD-10-CM | POA: Diagnosis not present

## 2024-01-01 DIAGNOSIS — E785 Hyperlipidemia, unspecified: Secondary | ICD-10-CM | POA: Diagnosis not present

## 2024-01-02 ENCOUNTER — Telehealth: Payer: Self-pay

## 2024-01-02 ENCOUNTER — Ambulatory Visit (INDEPENDENT_AMBULATORY_CARE_PROVIDER_SITE_OTHER): Payer: Medicare Other | Admitting: Physician Assistant

## 2024-01-02 VITALS — BP 147/93 | HR 77 | Temp 97.2°F | Ht 63.0 in | Wt 111.0 lb

## 2024-01-02 DIAGNOSIS — Z95828 Presence of other vascular implants and grafts: Secondary | ICD-10-CM

## 2024-01-02 DIAGNOSIS — I739 Peripheral vascular disease, unspecified: Secondary | ICD-10-CM

## 2024-01-02 NOTE — Telephone Encounter (Signed)
Received call from Zanette with [facility]. States patient saw vein and vascular today and that their notes stated patient should continue IV antibiotics from 1/21 for 4-6 weeks per ID.   Relayed that ID OPAT note states end date should have been 2/9. Also discussed that patient missed follow up appointment on 2/10. Facility reports they were unaware of appointment.   Zanette says there are issues with the patient's insurance and they were hoping she could be switched to an oral antibiotic.   Wadie Lessen place [phone]  Sandie Ano, RN

## 2024-01-02 NOTE — Progress Notes (Signed)
POST OPERATIVE OFFICE NOTE    CC:  F/u for surgery  HPI:  This is a 78 y.o. 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 Chestine Spore on 12/09/2023.  This involved harvesting of the left arm basilic vein for vein patch angioplasty of the right axillary artery as well as the right common femoral artery.  She was also seen by infectious disease during hospitalization who recommended 4 to 6 weeks of daptomycin based on culture data.  She is still at a skilled nursing facility.  She believes her right leg weakness is improving.  She believes her incisions are healing well.  She required wound VAC placement of her right groin which has since been discontinued.  She has pain in her right leg however denies any significant or regular rest pain in the right foot overnight.  She is on aspirin, Plavix, statin daily.  Allergies  Allergen Reactions   Wound Dressing Adhesive Rash and Other (See Comments)    Pt states that tape and electrodes leave red "scars" on her skin and her skin is very sensitive.  Paper tape ok to use    Current Outpatient Medications  Medication Sig Dispense Refill   aspirin EC 81 MG tablet Take 81 mg by mouth at bedtime.     atorvastatin (LIPITOR) 80 MG tablet Take 1 tablet (80 mg total) by mouth daily. (Patient taking differently: Take 80 mg by mouth at bedtime.) 30 tablet 11   clopidogrel (PLAVIX) 75 MG tablet Take 1 tablet (75 mg total) by mouth daily with breakfast. 30 tablet 3   Continuous Glucose Receiver (FREESTYLE LIBRE 3 READER) DEVI Use to monitor glucose continuously. 1 each 0   Continuous Glucose Sensor (FREESTYLE LIBRE 3 PLUS SENSOR) MISC Inject 1 Device into the skin continuous. Change every 15 days 6 each 3   diclofenac Sodium (VOLTAREN) 1 % GEL Apply 1 application. topically daily as needed (leg pain).     DULoxetine (CYMBALTA) 60 MG capsule Take 60 mg by mouth daily.     ferrous sulfate 325 (65 FE) MG EC tablet Take 325 mg by mouth  daily.     gabapentin (NEURONTIN) 300 MG capsule Take 1 capsule (300 mg total) by mouth 3 (three) times daily. 90 capsule 0   insulin aspart (NOVOLOG FLEXPEN) 100 UNIT/ML FlexPen 4-8 units with every meal 15 mL 11   insulin glargine (LANTUS SOLOSTAR) 100 UNIT/ML Solostar Pen Inject 5 Units into the skin at bedtime.     Insulin Pen Needle 32G X 4 MM MISC Use 4x a day 300 each 3   latanoprost (XALATAN) 0.005 % ophthalmic solution Place 1 drop into both eyes at bedtime.     lipase/protease/amylase 24000-76000 units CPEP Take 1 capsule (24,000 Units total) by mouth 3 (three) times daily before meals. (Patient taking differently: Take 24,000-48,000 Units by mouth See admin instructions. Take 2 capsules before meals and 1 capsule after meals.) 270 capsule 0   MAGNESIUM PO Take 1 tablet by mouth daily.     metFORMIN (GLUCOPHAGE-XR) 500 MG 24 hr tablet Take 1 tablet (500 mg total) by mouth 2 (two) times daily with a meal. 180 tablet 2   Multiple Vitamins-Minerals (CENTRUM SILVER 50+WOMEN) TABS Take 1 tablet by mouth daily.     oxybutynin (DITROPAN-XL) 10 MG 24 hr tablet Take 10 mg by mouth daily.     oxyCODONE (OXY IR/ROXICODONE) 5 MG immediate release tablet Take 1-2 tablets (5-10 mg total) by mouth every  4 (four) hours as needed for moderate pain (pain score 4-6). 30 tablet 0   pantoprazole (PROTONIX) 40 MG tablet Take 40 mg by mouth every evening.     polyethylene glycol (MIRALAX / GLYCOLAX) 17 g packet Take 17 g by mouth daily as needed for mild constipation.     senna-docusate (SENOKOT-S) 8.6-50 MG tablet Take 1 tablet by mouth daily.     temazepam (RESTORIL) 30 MG capsule Take 1 capsule (30 mg total) by mouth at bedtime as needed for sleep. 30 capsule 0   timolol (TIMOPTIC) 0.5 % ophthalmic solution Place 1 drop into both eyes every morning.     traZODone (DESYREL) 50 MG tablet Take 75 mg by mouth at bedtime as needed for sleep.     No current facility-administered medications for this visit.      ROS:  See HPI  Physical Exam:  Vitals:   01/02/24 1056  BP: (!) 147/93  Pulse: 77  Temp: (!) 97.2 F (36.2 C)  SpO2: 98%  Weight: 111 lb (50.3 kg)  Height: 5\' 3"  (1.6 m)    Incision: Right axillary incision healed; left basilic vein harvest site healed; right groin nearly healed pictured below Extremities: Palpable radial pulses; palpable left DP pulse; brisk right DP and PT by Doppler Neuro: A&O    Assessment/Plan:  This is a 78 y.o. female who is s/p: Excision of infected right axillary to femoral bypass involving left basilic vein harvest for vein patch angioplasty of the axillary and common femoral artery  The right chest incision has completely healed and all staples were removed today.  She also healed her left basilic vein harvest incision.  She was discharged from the hospital with a wound VAC on her right groin.  This area has nearly healed and will not require wound VAC.  Recommendations include daily cleansing with soap and water and a dry dressing to be changed daily.  Right hand is well-perfused with a palpable radial pulse.  She also has a palpable DP pulse on the left foot.  Her right lower extremity has brisk Doppler flow in the DP and PT position.  As discussed in the hospital, we will allow her to completely recover from her current surgery before discussing any further revascularization of the right lower extremity.  Given her brisk Doppler flow in her right foot, we are hopeful that she will not require any further surgery after adequate rehabilitation.  Continue IV antibiotics for a total of 4 to 6 weeks from surgery per ID.  She will follow-up with Dr. Lenell Antu in a couple months with an ABI.   Emilie Rutter, PA-C [facility] [phone]  Clinic MD:  Randie Heinz

## 2024-01-03 NOTE — Telephone Encounter (Signed)
Sounds good thank you

## 2024-01-03 NOTE — Telephone Encounter (Signed)
Called Jill Higgins with [facility], relayed per Dr. Luciana Axe that okay to remove central line and stop antibiotics. No need for oral antibiotics or ID clinic follow-up per provider. Jill Higgins verbalized understanding and states appointment is scheduled for tomorrow 2/14 for CVC removal.   Sandie Ano, RN

## 2024-01-04 ENCOUNTER — Ambulatory Visit (HOSPITAL_COMMUNITY)
Admission: RE | Admit: 2024-01-04 | Discharge: 2024-01-04 | Disposition: A | Discharge status: Home / Self Care | Payer: Medicare Other | Source: Ambulatory Visit | Attending: Nurse Practitioner | Admitting: Nurse Practitioner

## 2024-01-04 DIAGNOSIS — G479 Sleep disorder, unspecified: Secondary | ICD-10-CM | POA: Diagnosis not present

## 2024-01-04 DIAGNOSIS — N289 Disorder of kidney and ureter, unspecified: Secondary | ICD-10-CM | POA: Diagnosis not present

## 2024-01-04 DIAGNOSIS — Z452 Encounter for adjustment and management of vascular access device: Secondary | ICD-10-CM | POA: Diagnosis not present

## 2024-01-04 DIAGNOSIS — B9562 Methicillin resistant Staphylococcus aureus infection as the cause of diseases classified elsewhere: Secondary | ICD-10-CM | POA: Diagnosis not present

## 2024-01-04 DIAGNOSIS — T827XXA Infection and inflammatory reaction due to other cardiac and vascular devices, implants and grafts, initial encounter: Secondary | ICD-10-CM | POA: Diagnosis not present

## 2024-01-04 DIAGNOSIS — I739 Peripheral vascular disease, unspecified: Secondary | ICD-10-CM | POA: Diagnosis not present

## 2024-01-04 DIAGNOSIS — K5900 Constipation, unspecified: Secondary | ICD-10-CM | POA: Diagnosis not present

## 2024-01-04 DIAGNOSIS — R7881 Bacteremia: Secondary | ICD-10-CM | POA: Diagnosis not present

## 2024-01-04 HISTORY — PX: IR REMOVAL TUN CV CATH W/O FL: IMG2289

## 2024-01-04 NOTE — Procedures (Signed)
[facility]  Interventional Radiology Procedure Note  PROCEDURE SUMMARY:  Successful removal of tunneled catheter.  No complications.   EBL = trace  Please see full dictation in imaging section of Epic for procedure details.  Latrice Storlie S Jawad Wiacek

## 2024-01-05 DIAGNOSIS — H409 Unspecified glaucoma: Secondary | ICD-10-CM | POA: Diagnosis not present

## 2024-01-05 DIAGNOSIS — Z7984 Long term (current) use of oral hypoglycemic drugs: Secondary | ICD-10-CM | POA: Diagnosis not present

## 2024-01-05 DIAGNOSIS — E785 Hyperlipidemia, unspecified: Secondary | ICD-10-CM | POA: Diagnosis not present

## 2024-01-05 DIAGNOSIS — Z7902 Long term (current) use of antithrombotics/antiplatelets: Secondary | ICD-10-CM | POA: Diagnosis not present

## 2024-01-05 DIAGNOSIS — I10 Essential (primary) hypertension: Secondary | ICD-10-CM | POA: Diagnosis not present

## 2024-01-05 DIAGNOSIS — Z7982 Long term (current) use of aspirin: Secondary | ICD-10-CM | POA: Diagnosis not present

## 2024-01-05 DIAGNOSIS — E1151 Type 2 diabetes mellitus with diabetic peripheral angiopathy without gangrene: Secondary | ICD-10-CM | POA: Diagnosis not present

## 2024-01-05 DIAGNOSIS — Z604 Social exclusion and rejection: Secondary | ICD-10-CM | POA: Diagnosis not present

## 2024-01-05 DIAGNOSIS — M797 Fibromyalgia: Secondary | ICD-10-CM | POA: Diagnosis not present

## 2024-01-05 DIAGNOSIS — K219 Gastro-esophageal reflux disease without esophagitis: Secondary | ICD-10-CM | POA: Diagnosis not present

## 2024-01-05 DIAGNOSIS — Z48812 Encounter for surgical aftercare following surgery on the circulatory system: Secondary | ICD-10-CM | POA: Diagnosis not present

## 2024-01-06 DIAGNOSIS — E1151 Type 2 diabetes mellitus with diabetic peripheral angiopathy without gangrene: Secondary | ICD-10-CM | POA: Diagnosis not present

## 2024-01-06 DIAGNOSIS — Z7984 Long term (current) use of oral hypoglycemic drugs: Secondary | ICD-10-CM | POA: Diagnosis not present

## 2024-01-06 DIAGNOSIS — Z7902 Long term (current) use of antithrombotics/antiplatelets: Secondary | ICD-10-CM | POA: Diagnosis not present

## 2024-01-06 DIAGNOSIS — I10 Essential (primary) hypertension: Secondary | ICD-10-CM | POA: Diagnosis not present

## 2024-01-06 DIAGNOSIS — Z48812 Encounter for surgical aftercare following surgery on the circulatory system: Secondary | ICD-10-CM | POA: Diagnosis not present

## 2024-01-06 DIAGNOSIS — E785 Hyperlipidemia, unspecified: Secondary | ICD-10-CM | POA: Diagnosis not present

## 2024-01-06 DIAGNOSIS — Z604 Social exclusion and rejection: Secondary | ICD-10-CM | POA: Diagnosis not present

## 2024-01-06 DIAGNOSIS — Z7982 Long term (current) use of aspirin: Secondary | ICD-10-CM | POA: Diagnosis not present

## 2024-01-06 DIAGNOSIS — H409 Unspecified glaucoma: Secondary | ICD-10-CM | POA: Diagnosis not present

## 2024-01-06 DIAGNOSIS — M797 Fibromyalgia: Secondary | ICD-10-CM | POA: Diagnosis not present

## 2024-01-06 DIAGNOSIS — K219 Gastro-esophageal reflux disease without esophagitis: Secondary | ICD-10-CM | POA: Diagnosis not present

## 2024-01-07 DIAGNOSIS — H409 Unspecified glaucoma: Secondary | ICD-10-CM | POA: Diagnosis not present

## 2024-01-07 DIAGNOSIS — Z48812 Encounter for surgical aftercare following surgery on the circulatory system: Secondary | ICD-10-CM | POA: Diagnosis not present

## 2024-01-07 DIAGNOSIS — K219 Gastro-esophageal reflux disease without esophagitis: Secondary | ICD-10-CM | POA: Diagnosis not present

## 2024-01-07 DIAGNOSIS — E785 Hyperlipidemia, unspecified: Secondary | ICD-10-CM | POA: Diagnosis not present

## 2024-01-07 DIAGNOSIS — Z604 Social exclusion and rejection: Secondary | ICD-10-CM | POA: Diagnosis not present

## 2024-01-07 DIAGNOSIS — Z7982 Long term (current) use of aspirin: Secondary | ICD-10-CM | POA: Diagnosis not present

## 2024-01-07 DIAGNOSIS — M797 Fibromyalgia: Secondary | ICD-10-CM | POA: Diagnosis not present

## 2024-01-07 DIAGNOSIS — I10 Essential (primary) hypertension: Secondary | ICD-10-CM | POA: Diagnosis not present

## 2024-01-07 DIAGNOSIS — E1151 Type 2 diabetes mellitus with diabetic peripheral angiopathy without gangrene: Secondary | ICD-10-CM | POA: Diagnosis not present

## 2024-01-07 DIAGNOSIS — Z7984 Long term (current) use of oral hypoglycemic drugs: Secondary | ICD-10-CM | POA: Diagnosis not present

## 2024-01-07 DIAGNOSIS — Z7902 Long term (current) use of antithrombotics/antiplatelets: Secondary | ICD-10-CM | POA: Diagnosis not present

## 2024-01-08 ENCOUNTER — Telehealth: Payer: Self-pay

## 2024-01-08 NOTE — Telephone Encounter (Signed)
Triage Call: -pt called LM stating her RLE is still having a lot of pain.   -attempted to return call.  No answer

## 2024-01-08 NOTE — Telephone Encounter (Signed)
Returned PT with [facility] ([facility]) call to give VO for PT twice a week x 2 weeks. No further questions/concerns at this time.

## 2024-01-09 ENCOUNTER — Telehealth: Payer: Self-pay

## 2024-01-09 DIAGNOSIS — E785 Hyperlipidemia, unspecified: Secondary | ICD-10-CM | POA: Diagnosis not present

## 2024-01-09 DIAGNOSIS — K219 Gastro-esophageal reflux disease without esophagitis: Secondary | ICD-10-CM | POA: Diagnosis not present

## 2024-01-09 DIAGNOSIS — H409 Unspecified glaucoma: Secondary | ICD-10-CM | POA: Diagnosis not present

## 2024-01-09 DIAGNOSIS — I10 Essential (primary) hypertension: Secondary | ICD-10-CM | POA: Diagnosis not present

## 2024-01-09 DIAGNOSIS — M797 Fibromyalgia: Secondary | ICD-10-CM | POA: Diagnosis not present

## 2024-01-09 DIAGNOSIS — E1151 Type 2 diabetes mellitus with diabetic peripheral angiopathy without gangrene: Secondary | ICD-10-CM | POA: Diagnosis not present

## 2024-01-09 DIAGNOSIS — Z7984 Long term (current) use of oral hypoglycemic drugs: Secondary | ICD-10-CM | POA: Diagnosis not present

## 2024-01-09 DIAGNOSIS — Z7902 Long term (current) use of antithrombotics/antiplatelets: Secondary | ICD-10-CM | POA: Diagnosis not present

## 2024-01-09 DIAGNOSIS — Z604 Social exclusion and rejection: Secondary | ICD-10-CM | POA: Diagnosis not present

## 2024-01-09 DIAGNOSIS — Z7982 Long term (current) use of aspirin: Secondary | ICD-10-CM | POA: Diagnosis not present

## 2024-01-09 DIAGNOSIS — Z48812 Encounter for surgical aftercare following surgery on the circulatory system: Secondary | ICD-10-CM | POA: Diagnosis not present

## 2024-01-09 NOTE — Telephone Encounter (Addendum)
Triage: -pt called with c/o pain in her R leg and she can only take about 10 steps and she has to stop.  The pain is unbearable, that it is a lot worse then it was a week ago, even since her surgery.  She knows she still has more work to be done in the future but is fearful b/c of the increase in pain. -appt booked

## 2024-01-10 ENCOUNTER — Encounter: Payer: Self-pay | Admitting: Internal Medicine

## 2024-01-10 ENCOUNTER — Telehealth: Payer: Medicare Other | Admitting: Internal Medicine

## 2024-01-10 DIAGNOSIS — E1151 Type 2 diabetes mellitus with diabetic peripheral angiopathy without gangrene: Secondary | ICD-10-CM | POA: Diagnosis not present

## 2024-01-10 DIAGNOSIS — E7800 Pure hypercholesterolemia, unspecified: Secondary | ICD-10-CM | POA: Diagnosis not present

## 2024-01-10 NOTE — Progress Notes (Signed)
Patient ID: Jill Higgins, female   DOB: 18-Apr-1946, 78 y.o.   MRN: [id number]  HPI: Jill Higgins is a 78 y.o.-year-old female, returning for follow-up for DM2, dx in 2013, insulin-dependent, uncontrolled, with complications (CAD, PAD). Pt. previously saw Dr. Everardo All, but last visit with me 2 months ago.  Interim history: She has increased urination, but no blurry vision, nausea. Since last visit, she had axillo-femoral bypass graft  -she still has a lot of pain.  She was also admitted with bacteremia after surgery. Back from rehab 1 week ago.  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  Reviewed HbA1c: Lab Results  Component Value Date   HGBA1C 9.5 (A) 09/27/2023   HGBA1C 7.3 (A) 05/10/2023   HGBA1C 5.6 12/18/2022   HGBA1C 7.0 (A) 08/17/2022   HGBA1C 7.0 (A) 04/20/2022   HGBA1C 6.3 (A) 01/23/2022   HGBA1C 6.8 (A) 10/24/2021   HGBA1C 7.5 (A) 07/18/2021   HGBA1C 7.9 (A) 02/01/2021   HGBA1C 8.1 (A) 12/03/2020  06/26/2022: HbA1c 8.5%   She was previously on: - Metformin ER 1000 >> 500 mg 2x a day, with meals  - NovoLog 4-8 units 2x a day >> Novolog 2-6 units 2x a day 15 min before the meal If the sugars are high between meals, you can correct with NovoLog: >200: + 1 unit >300: + 2 units >400 + 3 units She had previous hypoglycemia with glipizide. She was on repaglinide until we started NovoLog.  Currently on:  - Metformin ER 500 mg 2x a day, with meals - Lantus 8-10 units at night  - added 09/2023 >> 20 >> 16 units daily - FiAsp >> Novolog 4-8 >> 6-10 units 2-3x a day before the meal Correction scale with Fiasp: >200: + 1 unit >300: + 2 units >400 + 3 units  Pt checks her sugars >4x a day with the Libre 2 CGM - PDM - we could not download this today (video visit): Ave Glu for last 90 days: [address]  Glu for last 30 days: [address] Glu for last 14 days: [address] Glu for last 7 days: 148 - am: 130-160 - 2h after b'fast: n/c - lunch: 130-160 - 2h after lunch: n/c - dinner: 70, 130 - 2h after dinner: n/c - bedtime: up to 190  Previously:  Previously:  Lowest blood sugars: 49 >> 70 >> 55 >> 70; she has hypoglycemia awareness at 70.  Highest sugar was 300s (hand surgery) >> 300s >> 300s >> 200.  Glucometer: Accu-Chek guide  Pt's meals - 2x a day.  - no CKD, BUN/creatinine: Lab Results  Component Value Date   BUN 9 12/19/2023   BUN 8 12/18/2023   CREATININE 0.99 12/19/2023   CREATININE 0.76 12/18/2023   Lab Results  Component Value Date   MICRALBCREAT 1.2 09/27/2023  She is not on ACE inhibitor/ARB.  -+ HL; last set of lipids: Lab Results  Component Value Date   CHOL 48 12/10/2023   HDL 20 (L) 12/10/2023   LDLCALC 15 12/10/2023   TRIG 65 12/10/2023   CHOLHDL 2.4 12/10/2023  On Mevacor 10 mg daily  - last eye exam was in 2024. No DR reportedly. + Glaucoma. Dr. Joseph Art.  - + numbness and tingling in her feet.  Last foot exam 09/27/2023.  On Gabapentin.  She has a history  of HTN, overactive bladder, depression/anxiety, anemia-on iron 325 mg daily. 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   ROS: + see HPI  Past Medical History:  Diagnosis Date   Bronchitis    Bulging disc    in neck   Depression with anxiety    Diabetes mellitus    Type II   Fibromyalgia    pt. denies   GERD (gastroesophageal reflux disease)    Glaucoma    History of bronchitis    Hyperlipidemia    Hypertension    Overactive bladder    Peripheral vascular disease (HCC)    Pneumonia    Sleep apnea    recent test negative for sleep apnea   Tobacco abuse    Urinary frequency    Past Surgical History:  Procedure Laterality Date   ABDOMINAL AORTAGRAM N/A 08/15/2013   Procedure: ABDOMINAL  Ronny Flurry;  Surgeon: Sherren Kerns, MD;  Location: [facility] CATH LAB;  Service: Cardiovascular;  Laterality: N/A;   ABDOMINAL AORTAGRAM N/A 06/19/2014   Procedure: ABDOMINAL Ronny Flurry;  Surgeon: Sherren Kerns, MD;  Location: [facility] CATH LAB;  Service: Cardiovascular;  Laterality: N/A;   ABDOMINAL AORTOGRAM W/LOWER EXTREMITY N/A 05/10/2018   Procedure: ABDOMINAL AORTOGRAM W/LOWER EXTREMITY;  Surgeon: Sherren Kerns, MD;  Location: [facility] INVASIVE CV LAB;  Service: Cardiovascular;  Laterality: N/A;  bilateral   ABDOMINAL AORTOGRAM W/LOWER EXTREMITY N/A 04/20/2023   Procedure: ABDOMINAL AORTOGRAM W/LOWER EXTREMITY;  Surgeon: Leonie Douglas, MD;  Location: [facility] INVASIVE CV LAB;  Service: Cardiovascular;  Laterality: N/A;   Aortogram w/ PTA  05/14/08,  11-04-10   Bilateral aortogram w/ bilateral  SFA PTA  stenting    APPLICATION OF WOUND VAC Right 12/09/2023   Procedure: APPLICATION OF WOUND VAC;  Surgeon: Cephus Shelling, MD;  Location: [facility] OR;  Service: Vascular;  Laterality: Right;   AXILLARY-FEMORAL BYPASS GRAFT Right 11/15/2023   Procedure: RIGHT AXILLO-FEMORAL BYPASS GRAFT;  Surgeon: Leonie Douglas, MD;  Location: [facility] OR;  Service: Vascular;  Laterality: Right;   AXILLARY-FEMORAL BYPASS GRAFT Right 12/09/2023   Procedure: EXCISION AXILLA-BIFEMORAL RIGHT;  Surgeon: Cephus Shelling, MD;  Location: [facility] OR;  Service: Vascular;  Laterality: Right;   BREAST SURGERY Right    boil removal   BUNIONECTOMY     L foot in the 1980s   COLONOSCOPY     2014   DILATION AND CURETTAGE OF UTERUS     X4   ENDARTERECTOMY FEMORAL Right 06/03/2018   Procedure: RIGHT FEMORAL ENDARTERECTOMY;  Surgeon: Sherren Kerns, MD;  Location: [facility] OR;  Service: Vascular;  Laterality: Right;   Epidural shots in neck      ESOPHAGOGASTRODUODENOSCOPY (EGD) WITH PROPOFOL N/A 09/15/2019   Procedure: ESOPHAGOGASTRODUODENOSCOPY (EGD) WITH PROPOFOL;  Surgeon: Willis Modena, MD;  Location: [facility] ENDOSCOPY;  Service: Endoscopy;   Laterality: N/A;   EUS N/A 09/15/2019   Procedure: UPPER ENDOSCOPIC ULTRASOUND (EUS) LINEAR;  Surgeon: Willis Modena, MD;  Location: [facility] ENDOSCOPY;  Service: Endoscopy;  Laterality: N/A;   EYE SURGERY Bilateral 2016   cataract removal   FEMORAL ARTERY EXPLORATION Right 12/09/2023   Procedure: Vein Patch Right FEMORAL ARTERY;  Surgeon: Cephus Shelling, MD;  Location: [facility] OR;  Service: Vascular;  Laterality: Right;   FEMORAL-POPLITEAL BYPASS GRAFT Left 10/21/2013   Procedure: LEFT FEMORAL-POPLITEAL ARTERY BYPASS WITH SAPHENOUS VEIN GRAFT , POPLITEAL ENDARTERECTOMY ,INTRAOPERATIVE ARTERIOGRAM, vein patch angioplasty to popliteal artery;  Surgeon:  Sherren Kerns, MD;  Location: [facility] OR;  Service: Vascular;  Laterality: Left;   FEMORAL-POPLITEAL BYPASS GRAFT Right 02/08/2016   Procedure: Right FEMORAL- to Above Knee POPLITEAL ARTERY Bypass Graft with reversed saphenous vein and Common Femoral Endarterectomy  with profundoplasty;  Surgeon: Sherren Kerns, MD;  Location: [facility] OR;  Service: Vascular;  Laterality: Right;   FINE NEEDLE ASPIRATION N/A 09/15/2019   Procedure: FINE NEEDLE ASPIRATION (FNA) LINEAR;  Surgeon: Willis Modena, MD;  Location: [facility] ENDOSCOPY;  Service: Endoscopy;  Laterality: N/A;   GROIN DEBRIDEMENT Left 10/28/2013   Procedure: left inner thigh DEBRIDEMENT;  Surgeon: Sherren Kerns, MD;  Location: [facility] OR;  Service: Vascular;  Laterality: Left;   HAND SURGERY Left 2010   IR FLUORO GUIDE CV LINE RIGHT  12/14/2023   IR REMOVAL TUN CV CATH W/O FL  01/04/2024   IR US GUIDE VASC ACCESS RIGHT  12/14/2023   LOWER EXTREMITY ANGIOGRAM Bilateral 12/24/2015   Procedure: Lower Extremity Angiogram;  Surgeon: Sherren Kerns, MD;  Location: [facility] INVASIVE CV LAB;  Service: Cardiovascular;  Laterality: Bilateral;   PATCH ANGIOPLASTY Right 06/03/2018   Procedure: PATCH ANGIOPLASTY USING A XENOSURE 1CM X 14CM BIOLOGIC PATCH;  Surgeon: Sherren Kerns, MD;  Location: [facility] OR;  Service: Vascular;   Laterality: Right;   PERIPHERAL VASCULAR CATHETERIZATION N/A 12/24/2015   Procedure: Abdominal Aortogram;  Surgeon: Sherren Kerns, MD;  Location: [facility] INVASIVE CV LAB;  Service: Cardiovascular;  Laterality: N/A;   PERIPHERAL VASCULAR CATHETERIZATION Left 12/24/2015   Procedure: Peripheral Vascular Balloon Angioplasty;  Surgeon: Sherren Kerns, MD;  Location: [facility] INVASIVE CV LAB;  Service: Cardiovascular;  Laterality: Left;  drug coated balloon   PERIPHERAL VASCULAR CATHETERIZATION N/A 06/30/2016   Procedure: Abdominal Aortogram;  Surgeon: Sherren Kerns, MD;  Location: [facility] INVASIVE CV LAB;  Service: Cardiovascular;  Laterality: N/A;   PERIPHERAL VASCULAR CATHETERIZATION Bilateral 06/30/2016   Procedure: Lower Extremity Angiography;  Surgeon: Sherren Kerns, MD;  Location: [facility] INVASIVE CV LAB;  Service: Cardiovascular;  Laterality: Bilateral;   TONSILLECTOMY     TRANSESOPHAGEAL ECHOCARDIOGRAM (CATH LAB) N/A 12/13/2023   Procedure: TRANSESOPHAGEAL ECHOCARDIOGRAM;  Surgeon: Chilton Si, MD;  Location: [facility] INVASIVE CV LAB;  Service: Cardiovascular;  Laterality: N/A;   VEIN HARVEST Left 12/09/2023   Procedure: BACILIC VEIN HARVEST LEFT ARM;  Surgeon: Cephus Shelling, MD;  Location: [facility] OR;  Service: Vascular;  Laterality: Left;   VEIN REPAIR Right 12/09/2023   Procedure: VEIN  PATCH RIGHT AXILLIA ARTERY RIGHT;  Surgeon: Cephus Shelling, MD;  Location: [facility] OR;  Service: Vascular;  Laterality: Right;   Social History   Socioeconomic History   Marital status: Single    Spouse name: Not on file   Number of children: 1   Years of education: Not on file   Highest education level: Not on file  Occupational History   Occupation: Retired    Associate Professor: Scientist, water quality  Tobacco Use   Smoking status: Former    Current packs/day: 0.25    Average packs/day: 0.3 packs/day for 0.6 years (0.1 ttl pk-yrs)    Types: Cigarettes    Start date: 06/2023    Quit date: 10/2014    Passive exposure: Never    Smokeless tobacco: Never   Tobacco comments:    pt states that she is using the E cig only   Vaping Use   Vaping status: Some Days  Substance and Sexual Activity   Alcohol use: Yes    Alcohol/week: 0.0 -  1.0 standard drinks of alcohol    Comment: Occasional; 10-22 rarely    Drug use: No   Sexual activity: Not on file  Other Topics Concern   Not on file  Social History Narrative   Drinks about 2 cups of coffee a day, and some tea    Social Drivers of Corporate investment banker Strain: Not on file  Food Insecurity: No Food Insecurity (12/11/2023)   Hunger Vital Sign    Worried About Running Out of Food in the Last Year: Never true    Ran Out of Food in the Last Year: Never true  Transportation Needs: No Transportation Needs (12/11/2023)   PRAPARE - Administrator, Civil Service (Medical): No    Lack of Transportation (Non-Medical): No  Physical Activity: Not on file  Stress: Not on file  Social Connections: Moderately Isolated (12/11/2023)   Social Connection and Isolation Panel [NHANES]    Frequency of Communication with Friends and Family: More than three times a week    Frequency of Social Gatherings with Friends and Family: More than three times a week    Attends Religious Services: 1 to 4 times per year    Active Member of [employer] or Organizations: No    Attends Banker Meetings: Never    Marital Status: Divorced  Catering manager Violence: Not At Risk (12/11/2023)   Humiliation, Afraid, Rape, and Kick questionnaire    Fear of Current or Ex-Partner: No    Emotionally Abused: No    Physically Abused: No    Sexually Abused: No   Current Outpatient Medications on File Prior to Visit  Medication Sig Dispense Refill   aspirin EC 81 MG tablet Take 81 mg by mouth at bedtime.     atorvastatin (LIPITOR) 80 MG tablet Take 1 tablet (80 mg total) by mouth daily. (Patient taking differently: Take 80 mg by mouth at bedtime.) 30 tablet 11   clopidogrel (PLAVIX)  75 MG tablet Take 1 tablet (75 mg total) by mouth daily with breakfast. 30 tablet 3   Continuous Glucose Receiver (FREESTYLE LIBRE 3 READER) DEVI Use to monitor glucose continuously. 1 each 0   Continuous Glucose Sensor (FREESTYLE LIBRE 3 PLUS SENSOR) MISC Inject 1 Device into the skin continuous. Change every 15 days 6 each 3   diclofenac Sodium (VOLTAREN) 1 % GEL Apply 1 application. topically daily as needed (leg pain).     DULoxetine (CYMBALTA) 60 MG capsule Take 60 mg by mouth daily.     ferrous sulfate 325 (65 FE) MG EC tablet Take 325 mg by mouth daily.     gabapentin (NEURONTIN) 300 MG capsule Take 1 capsule (300 mg total) by mouth 3 (three) times daily. 90 capsule 0   insulin aspart (NOVOLOG FLEXPEN) 100 UNIT/ML FlexPen 4-8 units with every meal 15 mL 11   insulin glargine (LANTUS SOLOSTAR) 100 UNIT/ML Solostar Pen Inject 5 Units into the skin at bedtime.     Insulin Pen Needle 32G X 4 MM MISC Use 4x a day 300 each 3   latanoprost (XALATAN) 0.005 % ophthalmic solution Place 1 drop into both eyes at bedtime.     lipase/protease/amylase 24000-76000 units CPEP Take 1 capsule (24,000 Units total) by mouth 3 (three) times daily before meals. (Patient taking differently: Take 24,000-48,000 Units by mouth See admin instructions. Take 2 capsules before meals and 1 capsule after meals.) 270 capsule 0   MAGNESIUM PO Take 1 tablet by mouth daily.  metFORMIN (GLUCOPHAGE-XR) 500 MG 24 hr tablet Take 1 tablet (500 mg total) by mouth 2 (two) times daily with a meal. 180 tablet 2   Multiple Vitamins-Minerals (CENTRUM SILVER 50+WOMEN) TABS Take 1 tablet by mouth daily.     oxybutynin (DITROPAN-XL) 10 MG 24 hr tablet Take 10 mg by mouth daily.     oxyCODONE (OXY IR/ROXICODONE) 5 MG immediate release tablet Take 1-2 tablets (5-10 mg total) by mouth every 4 (four) hours as needed for moderate pain (pain score 4-6). 30 tablet 0   pantoprazole (PROTONIX) 40 MG tablet Take 40 mg by mouth every evening.      polyethylene glycol (MIRALAX / GLYCOLAX) 17 g packet Take 17 g by mouth daily as needed for mild constipation.     senna-docusate (SENOKOT-S) 8.6-50 MG tablet Take 1 tablet by mouth daily.     temazepam (RESTORIL) 30 MG capsule Take 1 capsule (30 mg total) by mouth at bedtime as needed for sleep. 30 capsule 0   timolol (TIMOPTIC) 0.5 % ophthalmic solution Place 1 drop into both eyes every morning.     traZODone (DESYREL) 50 MG tablet Take 75 mg by mouth at bedtime as needed for sleep.     No current facility-administered medications on file prior to visit.   Allergies  Allergen Reactions   Wound Dressing Adhesive Rash and Other (See Comments)    Pt states that tape and electrodes leave red "scars" on her skin and her skin is very sensitive.  Paper tape ok to use   Family History  Problem Relation Age of Onset   Heart disease Father    Heart attack Father        MI at age 72   Hypertension Mother    Alzheimer's disease Mother    Hypertension Sister    Diabetes Brother    Hyperlipidemia Brother    Hypertension Brother    Heart disease Brother    Heart attack Brother    Breast cancer Maternal Aunt    PE: There were no vitals taken for this visit. Wt Readings from Last 3 Encounters:  01/02/24 111 lb (50.3 kg)  12/18/23 105 lb 2.6 oz (47.7 kg)  11/15/23 111 lb (50.3 kg)   Constitutional:  in NAD  The physical exam was not performed (virtual visit).  1. DM2, insulin-dependent, controlled, with complications - CAD - PAD - s/p stents  Component     Latest Ref Rng 04/20/2022  Glucose     65 - 99 mg/dL 161 (H)   C-Peptide     0.80 - 3.85 ng/mL 1.50     2.  Hyperlipidemia  PLAN:  1. Patient with longstanding, uncontrolled, type 2 diabetes, on oral antidiabetic regimen with metformin and also basal-bolus insulin regimen, adjusted at last visit.  At that time, HbA1c was higher, at 9.5%.  Sugars appear to be however significantly improved after the addition of insulin and  fluctuating mostly within the target range, and only increasing above target after meals and particularly in the first half of the night.  Upon questioning, she was keeping her pens in the fridge and not taking  them with her when eating out.  I advised her to do so.  We also discussed about taking only half of the Premeal dose if she forgets to take it before the meal and has to take it afterwards.  She felt that she was dropping her blood sugars too low overnight and we reduced her Lantus dose.  I also  advised her to vary the NovoLog dose before meals depending on the site of the meal -At today's visit, due to the virtual nature of the appointment, unfortunately, we could not download her CGM as she is checking with a PDM.  She is planning to get a new phone and we discussed about switching to checking with her phone.  She agrees to do so. -Reportedly, sugars appear to be higher than target at today's visit, sugars slightly above target in the morning but with still fair control later in the day.  I did advise her to increase her basal insulin for now slightly, but to pay attention to low blood sugars.  I advised her to contact me if she starts having blood sugars under 70s.  We did not change the NovoLog and metformin doses. - I suggested to:  Patient Instructions  Please continue: - Metformin ER 500 mg 2x a day, with meals - NovoLog 6-10 units 2-3x a day 15 min before the meal Correction scale with  >200: + 1 unit >300: + 2 units >400 + 3 units If you have to take the insulin after the meal, take only 50% of the dose.  Increase: - Lantus 20 units at night   Please return in 3 months.  - we will checked her HbA1c at next visit - advised to check sugars at different times of the day - 4x a day, rotating check times - advised for yearly eye exams >> she is UTD - return to clinic in 3 months  2. HL -Latest lipid panel was reviewed from 04/2023: All fractions at goal -Continue lovastatin 20  mg daily without side effects  Carlus Pavlov, MD PhD [facility]

## 2024-01-10 NOTE — Patient Instructions (Addendum)
Please continue: - Metformin ER 500 mg 2x a day, with meals - NovoLog 6-10 units 2-3x a day 15 min before the meal Correction scale with  >200: + 1 unit >300: + 2 units >400 + 3 units If you have to take the insulin after the meal, take only 50% of the dose.  Increase: - Lantus 20 units at night   Please return in 3 months.

## 2024-01-11 ENCOUNTER — Ambulatory Visit: Payer: Medicare Other | Admitting: Podiatry

## 2024-01-11 DIAGNOSIS — K219 Gastro-esophageal reflux disease without esophagitis: Secondary | ICD-10-CM | POA: Diagnosis not present

## 2024-01-11 DIAGNOSIS — E785 Hyperlipidemia, unspecified: Secondary | ICD-10-CM | POA: Diagnosis not present

## 2024-01-11 DIAGNOSIS — I10 Essential (primary) hypertension: Secondary | ICD-10-CM | POA: Diagnosis not present

## 2024-01-11 DIAGNOSIS — E1151 Type 2 diabetes mellitus with diabetic peripheral angiopathy without gangrene: Secondary | ICD-10-CM | POA: Diagnosis not present

## 2024-01-11 DIAGNOSIS — Z7902 Long term (current) use of antithrombotics/antiplatelets: Secondary | ICD-10-CM | POA: Diagnosis not present

## 2024-01-11 DIAGNOSIS — Z7984 Long term (current) use of oral hypoglycemic drugs: Secondary | ICD-10-CM | POA: Diagnosis not present

## 2024-01-11 DIAGNOSIS — Z48812 Encounter for surgical aftercare following surgery on the circulatory system: Secondary | ICD-10-CM | POA: Diagnosis not present

## 2024-01-11 DIAGNOSIS — Z7982 Long term (current) use of aspirin: Secondary | ICD-10-CM | POA: Diagnosis not present

## 2024-01-11 DIAGNOSIS — Z604 Social exclusion and rejection: Secondary | ICD-10-CM | POA: Diagnosis not present

## 2024-01-11 DIAGNOSIS — H409 Unspecified glaucoma: Secondary | ICD-10-CM | POA: Diagnosis not present

## 2024-01-11 DIAGNOSIS — M797 Fibromyalgia: Secondary | ICD-10-CM | POA: Diagnosis not present

## 2024-01-14 ENCOUNTER — Telehealth: Payer: Self-pay

## 2024-01-14 MED ORDER — ONETOUCH DELICA LANCETS 33G MISC: 5 refills | Status: DC

## 2024-01-14 MED ORDER — ONETOUCH VERIO REFLECT W/DEVICE KIT: PACK | 0 refills | Status: DC

## 2024-01-14 MED ORDER — ONETOUCH VERIO VI STRP: ORAL_STRIP | 5 refills | Status: DC

## 2024-01-14 NOTE — Telephone Encounter (Signed)
 Patient called stating that her blood sugar was dropping during the day and that she would eat or drink something and it goes back up. She has been just checking it with her Josephine Igo but not sure if its reading accurately but currently while on the phone her blood sugar was 100 and rising since she ate some chicken and rice with Orange juice. I also provided her with the Rule of !5 to help her blood sugar when it drops.   She is taking Lantus 16 units at bedtime. All other meds are the same.   I also sent in a glucometer along with all the supplies.

## 2024-01-15 ENCOUNTER — Ambulatory Visit (INDEPENDENT_AMBULATORY_CARE_PROVIDER_SITE_OTHER): Payer: Medicare Other | Admitting: Physician Assistant

## 2024-01-15 ENCOUNTER — Ambulatory Visit (HOSPITAL_COMMUNITY)
Admission: RE | Admit: 2024-01-15 | Discharge: 2024-01-15 | Disposition: A | Discharge status: Home / Self Care | Payer: Medicare Other | Source: Ambulatory Visit | Attending: Vascular Surgery | Admitting: Vascular Surgery

## 2024-01-15 VITALS — BP 144/86 | HR 74 | Temp 97.8°F | Resp 18 | Ht 63.0 in | Wt 107.0 lb

## 2024-01-15 DIAGNOSIS — I739 Peripheral vascular disease, unspecified: Secondary | ICD-10-CM

## 2024-01-15 LAB — VAS US ABI WITH/WO TBI
Left ABI: 1.05
Right ABI: 0.47

## 2024-01-15 MED ORDER — ONETOUCH DELICA LANCETS 33G MISC: 5 refills | Status: AC

## 2024-01-15 MED ORDER — ONETOUCH VERIO REFLECT W/DEVICE KIT: PACK | 0 refills | Status: DC

## 2024-01-15 MED ORDER — ONETOUCH VERIO VI STRP: ORAL_STRIP | 5 refills | Status: AC

## 2024-01-15 NOTE — Progress Notes (Signed)
 HISTORY AND PHYSICAL     CC:  follow up. Requesting Provider:  Merri Brunette, MD  HPI: This is a 78 y.o. female who is here today for follow up for PAD.  Pt has hx of  Redo exposure of right axillary artery with excision of right axillary artery to femoral artery bypass, vein patch angioplasty right axillary artery, vein patch angioplasty of right femoral artery distal bypass and vac right groin 12/09/2023 by Dr. Chestine Spore.  During her hospitalization, she was seen by infectious disease during hospitalization who recommended 4 to 6 weeks of daptomycin based on culture data.   In the past patient has a history of left common femoral endarterectomy with right femoral to above-knee popliteal bypass with vein by Dr. Darrick Penna. She also had left femoral to AK bypass with vein by Dr. Darrick Penna on 10/21/2013.  More recently on 04/20/2023 patient underwent stenting of the right iliac and common femoral arteries by Dr. Lenell Antu.  The stents occluded and she underwent the right axillary femoral bypass on 11/15/2023 with 6 mm ringed PTFE   Pt was last seen 01/02/2024 and at that time, her incisions were healing well.  Her wound vac was discontinued.  She was having pain in the right leg but did not have significant or regular rest pain in the right foot overnight.  She was compliant with plavix and statin. She was scheduled to f/u with Dr. Lenell Antu in a couple of months with an ABI.   The pt returns today for follow up.  She states that when she walks her legs feel heavy and if she keeps walking they start to cramp.  She states she does get some pain in her right foot but not every night.  When she is walking, she states that if she walks to the parking lot, she would have to stop a couple of times.  This is also a problem in her apartment as well.  She does not have any non healing wounds.  She states that she gets occasional pains in the left leg but not regularly.   She has hx of abdominal surgery for pancreatic mass (non  cancerous in the past at [facility])   Past Medical History:  Diagnosis Date   Bronchitis    Bulging disc    in neck   Depression with anxiety    Diabetes mellitus    Type II   Fibromyalgia    pt. denies   GERD (gastroesophageal reflux disease)    Glaucoma    History of bronchitis    Hyperlipidemia    Hypertension    Overactive bladder    Peripheral vascular disease (HCC)    Pneumonia    Sleep apnea    recent test negative for sleep apnea   Tobacco abuse    Urinary frequency     Past Surgical History:  Procedure Laterality Date   ABDOMINAL AORTAGRAM N/A 08/15/2013   Procedure: ABDOMINAL Ronny Flurry;  Surgeon: Sherren Kerns, MD;  Location: [facility] CATH LAB;  Service: Cardiovascular;  Laterality: N/A;   ABDOMINAL AORTAGRAM N/A 06/19/2014   Procedure: ABDOMINAL Ronny Flurry;  Surgeon: Sherren Kerns, MD;  Location: [facility] CATH LAB;  Service: Cardiovascular;  Laterality: N/A;   ABDOMINAL AORTOGRAM W/LOWER EXTREMITY N/A 05/10/2018   Procedure: ABDOMINAL AORTOGRAM W/LOWER EXTREMITY;  Surgeon: Sherren Kerns, MD;  Location: [facility] INVASIVE CV LAB;  Service: Cardiovascular;  Laterality: N/A;  bilateral   ABDOMINAL AORTOGRAM W/LOWER EXTREMITY N/A 04/20/2023   Procedure: ABDOMINAL AORTOGRAM W/LOWER EXTREMITY;  Surgeon:  Leonie Douglas, MD;  Location: [facility] INVASIVE CV LAB;  Service: Cardiovascular;  Laterality: N/A;   Aortogram w/ PTA  05/14/08,  11-04-10   Bilateral aortogram w/ bilateral  SFA PTA  stenting    APPLICATION OF WOUND VAC Right 12/09/2023   Procedure: APPLICATION OF WOUND VAC;  Surgeon: Cephus Shelling, MD;  Location: [facility] OR;  Service: Vascular;  Laterality: Right;   AXILLARY-FEMORAL BYPASS GRAFT Right 11/15/2023   Procedure: RIGHT AXILLO-FEMORAL BYPASS GRAFT;  Surgeon: Leonie Douglas, MD;  Location: [facility] OR;  Service: Vascular;  Laterality: Right;   AXILLARY-FEMORAL BYPASS GRAFT Right 12/09/2023   Procedure: EXCISION AXILLA-BIFEMORAL RIGHT;  Surgeon: Cephus Shelling, MD;  Location:  [facility] OR;  Service: Vascular;  Laterality: Right;   BREAST SURGERY Right    boil removal   BUNIONECTOMY     L foot in the 1980s   COLONOSCOPY     2014   DILATION AND CURETTAGE OF UTERUS     X4   ENDARTERECTOMY FEMORAL Right 06/03/2018   Procedure: RIGHT FEMORAL ENDARTERECTOMY;  Surgeon: Sherren Kerns, MD;  Location: [facility] OR;  Service: Vascular;  Laterality: Right;   Epidural shots in neck      ESOPHAGOGASTRODUODENOSCOPY (EGD) WITH PROPOFOL N/A 09/15/2019   Procedure: ESOPHAGOGASTRODUODENOSCOPY (EGD) WITH PROPOFOL;  Surgeon: Willis Modena, MD;  Location: [facility] ENDOSCOPY;  Service: Endoscopy;  Laterality: N/A;   EUS N/A 09/15/2019   Procedure: UPPER ENDOSCOPIC ULTRASOUND (EUS) LINEAR;  Surgeon: Willis Modena, MD;  Location: [facility] ENDOSCOPY;  Service: Endoscopy;  Laterality: N/A;   EYE SURGERY Bilateral 2016   cataract removal   FEMORAL ARTERY EXPLORATION Right 12/09/2023   Procedure: Vein Patch Right FEMORAL ARTERY;  Surgeon: Cephus Shelling, MD;  Location: [facility] OR;  Service: Vascular;  Laterality: Right;   FEMORAL-POPLITEAL BYPASS GRAFT Left 10/21/2013   Procedure: LEFT FEMORAL-POPLITEAL ARTERY BYPASS WITH SAPHENOUS VEIN GRAFT , POPLITEAL ENDARTERECTOMY ,INTRAOPERATIVE ARTERIOGRAM, vein patch angioplasty to popliteal artery;  Surgeon: Sherren Kerns, MD;  Location: [facility] OR;  Service: Vascular;  Laterality: Left;   FEMORAL-POPLITEAL BYPASS GRAFT Right 02/08/2016   Procedure: Right FEMORAL- to Above Knee POPLITEAL ARTERY Bypass Graft with reversed saphenous vein and Common Femoral Endarterectomy  with profundoplasty;  Surgeon: Sherren Kerns, MD;  Location: [facility] OR;  Service: Vascular;  Laterality: Right;   FINE NEEDLE ASPIRATION N/A 09/15/2019   Procedure: FINE NEEDLE ASPIRATION (FNA) LINEAR;  Surgeon: Willis Modena, MD;  Location: [facility] ENDOSCOPY;  Service: Endoscopy;  Laterality: N/A;   GROIN DEBRIDEMENT Left 10/28/2013   Procedure: left inner thigh DEBRIDEMENT;  Surgeon: Sherren Kerns, MD;   Location: [facility] OR;  Service: Vascular;  Laterality: Left;   HAND SURGERY Left 2010   IR FLUORO GUIDE CV LINE RIGHT  12/14/2023   IR REMOVAL TUN CV CATH W/O FL  01/04/2024   IR US GUIDE VASC ACCESS RIGHT  12/14/2023   LOWER EXTREMITY ANGIOGRAM Bilateral 12/24/2015   Procedure: Lower Extremity Angiogram;  Surgeon: Sherren Kerns, MD;  Location: [facility] INVASIVE CV LAB;  Service: Cardiovascular;  Laterality: Bilateral;   PATCH ANGIOPLASTY Right 06/03/2018   Procedure: PATCH ANGIOPLASTY USING A XENOSURE 1CM X 14CM BIOLOGIC PATCH;  Surgeon: Sherren Kerns, MD;  Location: [facility] OR;  Service: Vascular;  Laterality: Right;   PERIPHERAL VASCULAR CATHETERIZATION N/A 12/24/2015   Procedure: Abdominal Aortogram;  Surgeon: Sherren Kerns, MD;  Location: [facility] INVASIVE CV LAB;  Service: Cardiovascular;  Laterality: N/A;   PERIPHERAL VASCULAR CATHETERIZATION Left 12/24/2015   Procedure:  Peripheral Vascular Balloon Angioplasty;  Surgeon: Sherren Kerns, MD;  Location: [facility] INVASIVE CV LAB;  Service: Cardiovascular;  Laterality: Left;  drug coated balloon   PERIPHERAL VASCULAR CATHETERIZATION N/A 06/30/2016   Procedure: Abdominal Aortogram;  Surgeon: Sherren Kerns, MD;  Location: [facility] INVASIVE CV LAB;  Service: Cardiovascular;  Laterality: N/A;   PERIPHERAL VASCULAR CATHETERIZATION Bilateral 06/30/2016   Procedure: Lower Extremity Angiography;  Surgeon: Sherren Kerns, MD;  Location: [facility] INVASIVE CV LAB;  Service: Cardiovascular;  Laterality: Bilateral;   TONSILLECTOMY     TRANSESOPHAGEAL ECHOCARDIOGRAM (CATH LAB) N/A 12/13/2023   Procedure: TRANSESOPHAGEAL ECHOCARDIOGRAM;  Surgeon: Chilton Si, MD;  Location: [facility] INVASIVE CV LAB;  Service: Cardiovascular;  Laterality: N/A;   VEIN HARVEST Left 12/09/2023   Procedure: BACILIC VEIN HARVEST LEFT ARM;  Surgeon: Cephus Shelling, MD;  Location: [facility] OR;  Service: Vascular;  Laterality: Left;   VEIN REPAIR Right 12/09/2023   Procedure: VEIN  PATCH RIGHT AXILLIA ARTERY RIGHT;   Surgeon: Cephus Shelling, MD;  Location: [facility] OR;  Service: Vascular;  Laterality: Right;    Allergies  Allergen Reactions   Wound Dressing Adhesive Rash and Other (See Comments)    Pt states that tape and electrodes leave red "scars" on her skin and her skin is very sensitive.  Paper tape ok to use    Current Outpatient Medications  Medication Sig Dispense Refill   aspirin EC 81 MG tablet Take 81 mg by mouth at bedtime.     atorvastatin (LIPITOR) 80 MG tablet Take 1 tablet (80 mg total) by mouth daily. (Patient taking differently: Take 80 mg by mouth at bedtime.) 30 tablet 11   Blood Glucose Monitoring Suppl (ONETOUCH VERIO REFLECT) w/Device KIT Use to check blood sugar 3-4 times day 1 kit 0   clopidogrel (PLAVIX) 75 MG tablet Take 1 tablet (75 mg total) by mouth daily with breakfast. 30 tablet 3   Continuous Glucose Receiver (FREESTYLE LIBRE 3 READER) DEVI Use to monitor glucose continuously. 1 each 0   Continuous Glucose Sensor (FREESTYLE LIBRE 3 PLUS SENSOR) MISC Inject 1 Device into the skin continuous. Change every 15 days 6 each 3   diclofenac Sodium (VOLTAREN) 1 % GEL Apply 1 application. topically daily as needed (leg pain).     DULoxetine (CYMBALTA) 60 MG capsule Take 60 mg by mouth daily.     ferrous sulfate 325 (65 FE) MG EC tablet Take 325 mg by mouth daily.     gabapentin (NEURONTIN) 300 MG capsule Take 1 capsule (300 mg total) by mouth 3 (three) times daily. 90 capsule 0   glucose blood (ONETOUCH VERIO) test strip Use to check blood sugar 3-4 times a day 400 each 5   insulin aspart (NOVOLOG FLEXPEN) 100 UNIT/ML FlexPen 4-8 units with every meal 15 mL 11   insulin glargine (LANTUS SOLOSTAR) 100 UNIT/ML Solostar Pen Inject 5 Units into the skin at bedtime.     Insulin Pen Needle 32G X 4 MM MISC Use 4x a day 300 each 3   latanoprost (XALATAN) 0.005 % ophthalmic solution Place 1 drop into both eyes at bedtime.     lipase/protease/amylase 24000-76000 units CPEP Take 1 capsule  (24,000 Units total) by mouth 3 (three) times daily before meals. (Patient taking differently: Take 24,000-48,000 Units by mouth See admin instructions. Take 2 capsules before meals and 1 capsule after meals.) 270 capsule 0   MAGNESIUM PO Take 1 tablet by mouth daily.     metFORMIN (GLUCOPHAGE-XR) 500 MG  24 hr tablet Take 1 tablet (500 mg total) by mouth 2 (two) times daily with a meal. 180 tablet 2   Multiple Vitamins-Minerals (CENTRUM SILVER 50+WOMEN) TABS Take 1 tablet by mouth daily.     OneTouch Delica Lancets 33G MISC Check blood sugar 3-4 times a day 400 each 5   oxybutynin (DITROPAN-XL) 10 MG 24 hr tablet Take 10 mg by mouth daily.     oxyCODONE (OXY IR/ROXICODONE) 5 MG immediate release tablet Take 1-2 tablets (5-10 mg total) by mouth every 4 (four) hours as needed for moderate pain (pain score 4-6). 30 tablet 0   pantoprazole (PROTONIX) 40 MG tablet Take 40 mg by mouth every evening.     polyethylene glycol (MIRALAX / GLYCOLAX) 17 g packet Take 17 g by mouth daily as needed for mild constipation.     senna-docusate (SENOKOT-S) 8.6-50 MG tablet Take 1 tablet by mouth daily.     temazepam (RESTORIL) 30 MG capsule Take 1 capsule (30 mg total) by mouth at bedtime as needed for sleep. 30 capsule 0   timolol (TIMOPTIC) 0.5 % ophthalmic solution Place 1 drop into both eyes every morning.     traZODone (DESYREL) 50 MG tablet Take 75 mg by mouth at bedtime as needed for sleep.     No current facility-administered medications for this visit.    Family History  Problem Relation Age of Onset   Heart disease Father    Heart attack Father        MI at age 22   Hypertension Mother    Alzheimer's disease Mother    Hypertension Sister    Diabetes Brother    Hyperlipidemia Brother    Hypertension Brother    Heart disease Brother    Heart attack Brother    Breast cancer Maternal Aunt     Social History   Socioeconomic History   Marital status: Single    Spouse name: Not on file   Number  of children: 1   Years of education: Not on file   Highest education level: Not on file  Occupational History   Occupation: Retired    Associate Professor: Scientist, water quality  Tobacco Use   Smoking status: Former    Current packs/day: 0.25    Average packs/day: 0.3 packs/day for 0.6 years (0.1 ttl pk-yrs)    Types: Cigarettes    Start date: 06/2023    Quit date: 10/2014    Passive exposure: Never   Smokeless tobacco: Never   Tobacco comments:    pt states that she is using the E cig only   Vaping Use   Vaping status: Some Days  Substance and Sexual Activity   Alcohol use: Yes    Alcohol/week: 0.0 - 1.0 standard drinks of alcohol    Comment: Occasional; 10-22 rarely    Drug use: No   Sexual activity: Not on file  Other Topics Concern   Not on file  Social History Narrative   Drinks about 2 cups of coffee a day, and some tea    Social Drivers of Corporate investment banker Strain: Not on file  Food Insecurity: No Food Insecurity (12/11/2023)   Hunger Vital Sign    Worried About Running Out of Food in the Last Year: Never true    Ran Out of Food in the Last Year: Never true  Transportation Needs: No Transportation Needs (12/11/2023)   PRAPARE - Administrator, Civil Service (Medical): No    Lack of Transportation (Non-Medical):  No  Physical Activity: Not on file  Stress: Not on file  Social Connections: Moderately Isolated (12/11/2023)   Social Connection and Isolation Panel [NHANES]    Frequency of Communication with Friends and Family: More than three times a week    Frequency of Social Gatherings with Friends and Family: More than three times a week    Attends Religious Services: 1 to 4 times per year    Active Member of [employer] or Organizations: No    Attends Banker Meetings: Never    Marital Status: Divorced  Catering manager Violence: Not At Risk (12/11/2023)   Humiliation, Afraid, Rape, and Kick questionnaire    Fear of Current or Ex-Partner: No    Emotionally  Abused: No    Physically Abused: No    Sexually Abused: No     REVIEW OF SYSTEMS:   [X]  denotes positive finding, [ ]  denotes negative finding Cardiac  Comments:  Chest pain or chest pressure:    Shortness of breath upon exertion:    Short of breath when lying flat:    Irregular heart rhythm:        Vascular    Pain in calf, thigh, or hip brought on by ambulation: x   Pain in feet at night that wakes you up from your sleep:  x occasionally  Blood clot in your veins:    Leg swelling:         Pulmonary    Oxygen at home:    Productive cough:     Wheezing:         Neurologic    Sudden weakness in arms or legs:     Sudden numbness in arms or legs:     Sudden onset of difficulty speaking or slurred speech:    Temporary loss of vision in one eye:     Problems with dizziness:         Gastrointestinal    Blood in stool:     Vomited blood:         Genitourinary    Burning when urinating:     Blood in urine:        Psychiatric    Major depression:         Hematologic    Bleeding problems:    Problems with blood clotting too easily:        Skin    Rashes or ulcers:        Constitutional    Fever or chills:      PHYSICAL EXAMINATION:  Today's Vitals   01/15/24 0922 01/15/24 0925  BP: (!) 144/86   Pulse: 74   Resp: 18   Temp: 97.8 F (36.6 C)   TempSrc: Temporal   SpO2: 98%   Weight: 107 lb (48.5 kg)   Height: 5\' 3"  (1.6 m)   PainSc: 0-No pain 8    Body mass index is 18.95 kg/m.   General:  WDWN in NAD; vital signs documented above Gait: Not observed HENT: WNL, normocephalic Pulmonary: normal non-labored breathing , without wheezing Skin: right chest and right groin incisions have healed Vascular Exam/Pulses: -palpable femoral pulses bilaterally -pedal pulses are not palpable -radial pulses are palpable bilaterally  Non-Invasive Vascular Imaging:   ABI's/TBI's on 01/15/2024: Right:  0.47/near absent Left:  1.05/0.79 - Great toe pressure:  115  Previous ABI's/TBI's on 09/21/2023: Right:  0.50/0.22 - Great toe pressure: 27 Left:  1.06/0.60 - Great toe pressure:  74  Previous arterial duplex: 10/10/2023: Right  Doppler Findings:  +---------------+----------+---------+--------+--------+  Site          PSV (cm/s)Waveform StenosisComments  +---------------+----------+---------+--------+--------+  Subclavian Prox113       triphasic                  +---------------+----------+---------+--------+--------+  Subclavian Mid 133       triphasic                  +---------------+----------+---------+--------+--------+  Subclavian Dist173       biphasic                   +---------------+----------+---------+--------+--------+  Axillary      53        triphasic                  +---------------+----------+---------+--------+--------+  Brachial Prox  67        biphasic                   +---------------+----------+---------+--------+--------+  Brachial Mid   78        biphasic                   +---------------+----------+---------+--------+--------+  Brachial Dist  66        biphasic                   +---------------+----------+---------+--------+--------+  Radial Dist    52        biphasic                   +---------------+----------+---------+--------+--------+  Ulnar Dist     41        biphasic                   +---------------+----------+---------+--------+--------+    Summary:    Right: No evidence of hemodynamically significant stenosis throughout the Right upper extremity arteries.   Abdominal Aorta Findings:  +-------------+-------+----------+----------+----------+--------+--------+  Location    AP (cm)Trans (cm)PSV (cm/s)Waveform  ThrombusComments  +-------------+-------+----------+----------+----------+--------+--------+  Distal                       41                                     +-------------+-------+----------+----------+----------+--------+--------+  RT CIA Prox                   41        monophasic                  +-------------+-------+----------+----------+----------+--------+--------+  RT CIA Mid                    38        monophasic                  +-------------+-------+----------+----------+----------+--------+--------+  RT CIA Distal                 32        monophasic                  +-------------+-------+----------+----------+----------+--------+--------+  RT EIA Prox                             occluded                    +-------------+-------+----------+----------+----------+--------+--------+  RT EIA Mid                              occluded                    +-------------+-------+----------+----------+----------+--------+--------+  RT EIA Distal                           occluded                    +-------------+-------+----------+----------+----------+--------+--------+   Right internal iliac artery 170 cm/s  Right EIA collateral 55 cm/s monophasic  Right CFA 72 cm/s monophasic.     Summary:  Stenosis:  Patent right CIA/stent with no stenosis. The EIA/stent appears occluded  with a collateral vessel present    ASSESSMENT/PLAN:: 78 y.o. female here for follow up for PAD with hx of  Redo exposure of right axillary artery with excision of right axillary artery to femoral artery bypass, vein patch angioplasty right axillary artery, vein patch angioplasty of right femoral artery distal bypass and vac right groin 12/09/2023 by Dr. Chestine Spore.  In the past patient has a history of left common femoral endarterectomy with right femoral to above-knee popliteal bypass with vein by Dr. Darrick Penna. She also had left femoral to AK bypass with vein by Dr. Darrick Penna on 10/21/2013.  More recently on 04/20/2023 patient underwent stenting of the right iliac and common femoral arteries by Dr. Lenell Antu.  The stents  occluded and she underwent the right axillary femoral bypass on 11/15/2023 with 6 mm ringed PTFE    -pt with lifestyle limiting claudication in the right leg after removal of infected ax-fem bypass graft.  She does not have any ulcerations on her feet. -Dr. Lenell Antu discussed with pt that the next step with revascularization is laparotomy with aortofemoral bypass and he discussed with her that this comes with significant risks including  but not limited to renal failure, respiratory failure, death.  -will bring her back in a month with CTA a/p with runoff and see Dr. Lenell Antu for further discussions.  In the meantime, discussed with her to continue with as much walking as she can tolerate to help develop collateral flow  -pt has hx of LLE bypass in 2014.  CTA with runoff will also evaluate the left leg as well.   -continue asa/statin  -pt will f/u in 4 weeks with CTA a/p and see Dr. Lenell Antu.   Doreatha Massed, [facility] [phone]  Clinic MD:   Lenell Antu

## 2024-01-15 NOTE — Telephone Encounter (Signed)
 Jill Higgins! Thank you -we may need lower doses of her rapid acting insulin if she continues to have lows throughout the day.  Please let us know. Ty! C

## 2024-01-15 NOTE — Addendum Note (Signed)
 Addended by: Pollie Meyer on: 01/15/2024 04:53 PM   Modules accepted: Orders

## 2024-01-15 NOTE — Telephone Encounter (Signed)
 Pt has been notified and voices understanding.

## 2024-01-15 NOTE — Telephone Encounter (Signed)
 Requested Prescriptions   Signed Prescriptions Disp Refills   Blood Glucose Monitoring Suppl (ONETOUCH VERIO REFLECT) w/Device KIT 1 kit 0    Sig: Use to check blood sugar 3-4 times day    Authorizing Provider: Carlus Pavlov    Ordering User: Fadi Menter S   glucose blood (ONETOUCH VERIO) test strip 400 each 5    Sig: Use to check blood sugar 3-4 times a day    Authorizing Provider: Carlus Pavlov    Ordering User: Jermone Geister S   OneTouch Delica Lancets 33G MISC 400 each 5    Sig: Check blood sugar 3-4 times a day    Authorizing Provider: Carlus Pavlov    Ordering User: Pollie Meyer

## 2024-01-16 ENCOUNTER — Other Ambulatory Visit: Payer: Self-pay

## 2024-01-16 DIAGNOSIS — I739 Peripheral vascular disease, unspecified: Secondary | ICD-10-CM

## 2024-01-16 DIAGNOSIS — Z95828 Presence of other vascular implants and grafts: Secondary | ICD-10-CM

## 2024-01-17 ENCOUNTER — Ambulatory Visit: Payer: Medicare Other | Admitting: Podiatry

## 2024-01-18 ENCOUNTER — Telehealth: Payer: Self-pay

## 2024-01-18 NOTE — Telephone Encounter (Signed)
 Left a message to call back to check in on her to see if she went to the ER or not, and to see how her blood sugars are doing now and to see if she went to pick up the glucometer.

## 2024-01-18 NOTE — Telephone Encounter (Signed)
 Pt called and left a vm stating her blood sugar dropped to 27 and she had to call EMS.

## 2024-01-19 ENCOUNTER — Telehealth: Payer: Self-pay | Admitting: Endocrinology

## 2024-01-19 DIAGNOSIS — Z48812 Encounter for surgical aftercare following surgery on the circulatory system: Secondary | ICD-10-CM | POA: Diagnosis not present

## 2024-01-19 DIAGNOSIS — M797 Fibromyalgia: Secondary | ICD-10-CM | POA: Diagnosis not present

## 2024-01-19 DIAGNOSIS — H409 Unspecified glaucoma: Secondary | ICD-10-CM | POA: Diagnosis not present

## 2024-01-19 DIAGNOSIS — Z7984 Long term (current) use of oral hypoglycemic drugs: Secondary | ICD-10-CM | POA: Diagnosis not present

## 2024-01-19 DIAGNOSIS — Z7902 Long term (current) use of antithrombotics/antiplatelets: Secondary | ICD-10-CM | POA: Diagnosis not present

## 2024-01-19 DIAGNOSIS — K219 Gastro-esophageal reflux disease without esophagitis: Secondary | ICD-10-CM | POA: Diagnosis not present

## 2024-01-19 DIAGNOSIS — E1151 Type 2 diabetes mellitus with diabetic peripheral angiopathy without gangrene: Secondary | ICD-10-CM | POA: Diagnosis not present

## 2024-01-19 DIAGNOSIS — Z7982 Long term (current) use of aspirin: Secondary | ICD-10-CM | POA: Diagnosis not present

## 2024-01-19 DIAGNOSIS — I10 Essential (primary) hypertension: Secondary | ICD-10-CM | POA: Diagnosis not present

## 2024-01-19 DIAGNOSIS — Z604 Social exclusion and rejection: Secondary | ICD-10-CM | POA: Diagnosis not present

## 2024-01-19 DIAGNOSIS — E785 Hyperlipidemia, unspecified: Secondary | ICD-10-CM | POA: Diagnosis not present

## 2024-01-19 NOTE — Telephone Encounter (Signed)
 Received call from home health nurse regarding hypoglycemia in the morning in the range of 40-50.  Patient had hypoglycemic episode requiring EMS visit 2 days ago.  Patient reports she was taking Lantus 16 units at bedtime and NovoLog 4 to 8 units with meals 2-3 times a day.  Patient did not take Lantus last night and this morning blood sugar was 144.  Patient is not clear about dose of NovoLog and the sliding scale.  She mentioned when blood sugar is high she generally takes 4 units and when blood sugar is low she takes 8 units?  On the day of when she had hypoglycemia she mentions she took NovoLog 4 units however does not recall the blood sugar.  Patient mentions she also gets blood sugar in 70 and 80 sometime in the morning however mostly low.  Blood sugar in the afternoon she mention are variable not sure about hypoglycemia.  She has continuous glucose monitor.  She mentions she is also going to pick up glucometer and test supplies this afternoon.   Plan: I advised to take Lantus 12 units at bedtime.  Advised to take NovoLog 2 units for a small meal and 4 units for larger meal.  Patient has confusion regarding sliding scale.  She needs a written instruction to use sliding scale.  Not able to provide today, by the time I talked with the patient home health nurse left home.  Advised to stay on tabove mentioned insulin regimen.  Monitor blood sugar with CGM and the glucometer.  Asked to call our clinic on Monday or before if needed.  [place] Leilanni Halvorson, MD [facility] [address] [place], [address] [place], [place] [address] Phone # [phone]

## 2024-01-20 ENCOUNTER — Ambulatory Visit (HOSPITAL_BASED_OUTPATIENT_CLINIC_OR_DEPARTMENT_OTHER)
Admission: RE | Admit: 2024-01-20 | Discharge: 2024-01-20 | Disposition: A | Discharge status: Home / Self Care | Payer: Medicare Other | Source: Ambulatory Visit | Attending: Vascular Surgery | Admitting: Vascular Surgery

## 2024-01-20 DIAGNOSIS — Z9049 Acquired absence of other specified parts of digestive tract: Secondary | ICD-10-CM | POA: Diagnosis not present

## 2024-01-20 DIAGNOSIS — Z95828 Presence of other vascular implants and grafts: Secondary | ICD-10-CM | POA: Diagnosis not present

## 2024-01-20 DIAGNOSIS — I739 Peripheral vascular disease, unspecified: Secondary | ICD-10-CM | POA: Insufficient documentation

## 2024-01-20 DIAGNOSIS — I10 Essential (primary) hypertension: Secondary | ICD-10-CM | POA: Diagnosis not present

## 2024-01-20 DIAGNOSIS — I700 Atherosclerosis of aorta: Secondary | ICD-10-CM | POA: Diagnosis not present

## 2024-01-20 MED ORDER — IOHEXOL 350 MG/ML SOLN
125.0000 mL | Freq: Once | INTRAVENOUS | Status: AC | PRN
  Administered 2024-01-20: 125 mL via INTRAVENOUS

## 2024-01-21 NOTE — Telephone Encounter (Signed)
 J, See Dr. Wadie Lessen on call note -for now, she can continue to only take 12 units of Lantus daily along with: -4 units of NovoLog for larger meal -2 units of NovoLog for a smaller meal And do not use the sliding scale to avoid further confusion. I would like to see her in clinic on Thursday at 9 AM or 1 PM if possible.  Thank you! C

## 2024-01-22 ENCOUNTER — Telehealth: Payer: Self-pay

## 2024-01-22 NOTE — Telephone Encounter (Signed)
 Cecilia called to get VO for pt to get in home PT weekly x 7 weeks. VO given. No further questions/concerns at this time.

## 2024-01-23 NOTE — Telephone Encounter (Signed)
 Pt is coming tomorrow at 920 since her blood sugar continues to drop

## 2024-01-24 ENCOUNTER — Ambulatory Visit: Admitting: Internal Medicine

## 2024-01-24 ENCOUNTER — Ambulatory Visit (INDEPENDENT_AMBULATORY_CARE_PROVIDER_SITE_OTHER)

## 2024-01-24 ENCOUNTER — Encounter: Payer: Self-pay | Admitting: Podiatry

## 2024-01-24 ENCOUNTER — Encounter: Payer: Self-pay | Admitting: Internal Medicine

## 2024-01-24 ENCOUNTER — Ambulatory Visit: Payer: Medicare Other | Admitting: Podiatry

## 2024-01-24 VITALS — BP 120/80 | HR 56 | Ht 63.0 in | Wt 107.0 lb

## 2024-01-24 DIAGNOSIS — B351 Tinea unguium: Secondary | ICD-10-CM | POA: Diagnosis not present

## 2024-01-24 DIAGNOSIS — M79671 Pain in right foot: Secondary | ICD-10-CM | POA: Diagnosis not present

## 2024-01-24 DIAGNOSIS — Z7984 Long term (current) use of oral hypoglycemic drugs: Secondary | ICD-10-CM | POA: Diagnosis not present

## 2024-01-24 DIAGNOSIS — Z794 Long term (current) use of insulin: Secondary | ICD-10-CM

## 2024-01-24 DIAGNOSIS — M21611 Bunion of right foot: Secondary | ICD-10-CM | POA: Diagnosis not present

## 2024-01-24 DIAGNOSIS — L84 Corns and callosities: Secondary | ICD-10-CM

## 2024-01-24 DIAGNOSIS — E7800 Pure hypercholesterolemia, unspecified: Secondary | ICD-10-CM | POA: Diagnosis not present

## 2024-01-24 DIAGNOSIS — M79675 Pain in left toe(s): Secondary | ICD-10-CM

## 2024-01-24 DIAGNOSIS — E1151 Type 2 diabetes mellitus with diabetic peripheral angiopathy without gangrene: Secondary | ICD-10-CM

## 2024-01-24 DIAGNOSIS — M79674 Pain in right toe(s): Secondary | ICD-10-CM

## 2024-01-24 DIAGNOSIS — E1159 Type 2 diabetes mellitus with other circulatory complications: Secondary | ICD-10-CM | POA: Diagnosis not present

## 2024-01-24 LAB — POCT GLYCOSYLATED HEMOGLOBIN (HGB A1C): Hemoglobin A1C: 6.1 % — AB (ref 4.0–5.6)

## 2024-01-24 NOTE — Patient Instructions (Addendum)
 Please continue: - Metformin ER 500 mg 2x a day, with meals  Stop: - Lantus   Only use: - Novolog 2-3 units 15 min before a larger meal  Please return in 2-3 months.

## 2024-01-24 NOTE — Progress Notes (Signed)
  Subjective:  Patient ID: Jill Higgins, female    DOB: Jan 02, 1946,  MRN: [id number]  No chief complaint on file.   78 y.o. female presenting for diabetic foot care. History confirmed with patient. Patient presenting with pain related to dystrophic thickened elongated nails. Patient is unable to trim own nails related to nail dystrophy and/or mobility issues. Patient does have a history of T2DM. Patient does have callus present located at the bilateral subfifth met head region causing pain.  Does have history of PAD with bilateral intervention, did have procedure right lower extremity last December.  Objective:  Physical Exam: warm to cool, good capillary refill, pedal hair growth absent, pedal skin atrophic nail exam onychomycosis of the toenails and onycholysis Right DP and PT nonpalpable, left DP and PT faintly palpable, protective sensation intact, and vibratory sensation diminished.  Reports subjective neuropathy symptoms Left Foot:  Pain with palpation of nails due to elongation and dystrophic growth.  Right Foot: Pain with palpation of nails due to elongation and dystrophic growth.  Hallux limitus noted Mild bunion deformities with adjacent hammertoes right greater than left with toes pressing against each other  Radiographs: Right foot 3 views weightbearing 01/24/2024 Moderate bunion deformity with elevated first ray noted, adjacent hammertoe contracture.  History of tailor's bunionectomy noted   Assessment:   1. Bunion, right foot   2. Type 2 diabetes mellitus with vascular disease (HCC)   3. Pain due to onychomycosis of toenails of both feet   4. Pre-ulcerative calluses      Plan:  Patient was evaluated and treated and all questions answered.  #Hyperkeratotic lesions/pre ulcerative calluses present subfifth metatarsal head bilaterally All symptomatic hyperkeratoses x 2 separate lesions were safely debrided with a sterile #10 blade to patient's level of comfort without  incident. We discussed preventative and palliative care of these lesions including supportive and accommodative shoegear, padding, prefabricated and custom molded accommodative orthoses, use of a pumice stone and lotions/creams daily. -Documented history of PAD with history of intervention. Recent CT Angio 01/20/2024  #Onychomycosis with pain  -Nails palliatively debrided as below. -Educated on self-care  Procedure: Nail Debridement Rationale: Pain Type of Debridement: manual, sharp debridement. Instrumentation: Nail nipper, rotary burr. Number of Nails: 10  # Right foot bunion and hammertoe deformtiy -Pain from first 2 toes pressing against each other.  Toe spacer dispensed. -Will continue to manage conservatively, patient has PAD, history of RLE bypass.   Patient educated on diabetes. Discussed proper diabetic foot care and discussed risks and complications of disease. Educated patient in depth on reasons to return to the office immediately should he/she discover anything concerning or new on the feet. All questions answered. Discussed proper shoes as well.   Return in about 3 months (around 04/25/2024) for Diabetic Foot Care.         Bronwen Betters, DPM [facility]

## 2024-01-24 NOTE — Patient Instructions (Signed)
 Look for urea 40% cream or ointment and apply to the thickened dry skin / calluses. This can be bought over the counter, at a pharmacy or online such as [employer].  Over-the-counter urea creams and lower concentrations include AmLactin or Lac-Hydrin.  He can also scrub the calluses with white vinegar prior to showering.

## 2024-01-24 NOTE — Progress Notes (Signed)
 Patient ID: Jill Higgins, female   DOB: 1945/11/27, 78 y.o.   MRN: [id number]  HPI: Jill Higgins is a 78 y.o.-year-old female, returning for follow-up for DM2, dx in 2013, insulin-dependent, uncontrolled, with complications (CAD, PAD). Pt. previously saw Dr. Everardo All, but last visit with me 2.5 weeks ago.  Interim history: She has increased urination, but no blurry vision, nausea. Before last visit, she had axillo-femoral bypass graft  -she still has a lot of pain.  She was also admitted with bacteremia after surgery.  At last visit, she just returned from rehab. Her pain resolved. 2 seeks ago, her sugars dropped to 27 (she lost consciousness) per EMS.  Afterwards, her doses of insulin were reduced.  She still has lows, though.  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  Reviewed HbA1c: Lab Results  Component Value Date   HGBA1C 9.5 (A) 09/27/2023   HGBA1C 7.3 (A) 05/10/2023   HGBA1C 5.6 12/18/2022   HGBA1C 7.0 (A) 08/17/2022   HGBA1C 7.0 (A) 04/20/2022   HGBA1C 6.3 (A) 01/23/2022   HGBA1C 6.8 (A) 10/24/2021   HGBA1C 7.5 (A) 07/18/2021   HGBA1C 7.9 (A) 02/01/2021   HGBA1C 8.1 (A) 12/03/2020  06/26/2022: HbA1c 8.5%   She was previously on: - Metformin ER 1000 >> 500 mg 2x a day, with meals  - NovoLog 4-8 units 2x a day >> Novolog 2-6 units 2x a day 15 min before the meal If the sugars are high between meals, you can correct with NovoLog: >200: + 1 unit >300: + 2 units >400 + 3 units She had previous hypoglycemia with glipizide. She was on repaglinide until we started NovoLog.  Currently on:  - Metformin ER 500 mg 2x a day, with meals - Lantus 79-10 units at night  - added 09/2023 >> 79 >> 16 >> 12 >> 10  units daily - FiAsp >> Novolog 4-8 >> 6-10 units 2-3x a day before the meal >> 2 to 4 units depending  on the meal size Correction scale with Fiasp -stopped since last visit due to low blood sugars >200: + 1 unit >300: + 2 units >400 + 3 units  Pt checks her sugars >4x a day with the Libre 2 CGM - PDM:  Previously: Ave Glu for last 90 days: [address] Glu for last 30 days: [address] Glu for last 14 days: [address] Glu for last 7 days: 148 - am: 130-160 - 2h after b'fast: n/c - lunch: 130-160 - 2h after lunch: n/c - dinner: 70, 130 - 2h after dinner: n/c - bedtime: up to 190  Previously:   Lowest blood sugars: 49 >> 70 >> 55 >> 70 >> 27!!!; she has hypoglycemia awareness at 70.  Highest sugar was 300s >> 200 >> high 300s.  Glucometer: Accu-Chek guide  Pt's meals - 2x a day.  - no CKD, BUN/creatinine: Lab Results  Component Value Date   BUN 9 12/19/2023   BUN 8 12/18/2023   CREATININE 0.99 12/19/2023   CREATININE 0.76 12/18/2023   Lab Results  Component Value Date   MICRALBCREAT 1.2 09/27/2023  She is not on ACE inhibitor/ARB.  -+ HL; last set of lipids: Lab Results  Component Value Date   CHOL 48 12/10/2023   HDL 20 (L) 12/10/2023   LDLCALC 15 12/10/2023   TRIG 65 12/10/2023  CHOLHDL 2.4 12/10/2023  On Mevacor 10 mg daily  - last eye exam was in 2024. No DR reportedly. + Glaucoma. Dr. Joseph Art.  - + numbness and tingling in her feet.  Last foot exam 09/27/2023.  On Gabapentin. Will see Podiatry later today.  She has a history of HTN, overactive bladder, depression/anxiety, anemia-on iron 325 mg daily. 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   ROS: + see HPI  Past Medical History:  Diagnosis Date   Bronchitis    Bulging disc    in neck   Depression with anxiety    Diabetes mellitus    Type II   Fibromyalgia    pt. denies   GERD (gastroesophageal reflux disease)    Glaucoma    History of bronchitis    Hyperlipidemia    Hypertension    Overactive bladder     Peripheral vascular disease (HCC)    Pneumonia    Sleep apnea    recent test negative for sleep apnea   Tobacco abuse    Urinary frequency    Past Surgical History:  Procedure Laterality Date   ABDOMINAL AORTAGRAM N/A 08/15/2013   Procedure: ABDOMINAL Ronny Flurry;  Surgeon: Sherren Kerns, MD;  Location: [facility] CATH LAB;  Service: Cardiovascular;  Laterality: N/A;   ABDOMINAL AORTAGRAM N/A 06/19/2014   Procedure: ABDOMINAL Ronny Flurry;  Surgeon: Sherren Kerns, MD;  Location: [facility] CATH LAB;  Service: Cardiovascular;  Laterality: N/A;   ABDOMINAL AORTOGRAM W/LOWER EXTREMITY N/A 05/10/2018   Procedure: ABDOMINAL AORTOGRAM W/LOWER EXTREMITY;  Surgeon: Sherren Kerns, MD;  Location: [facility] INVASIVE CV LAB;  Service: Cardiovascular;  Laterality: N/A;  bilateral   ABDOMINAL AORTOGRAM W/LOWER EXTREMITY N/A 04/20/2023   Procedure: ABDOMINAL AORTOGRAM W/LOWER EXTREMITY;  Surgeon: Leonie Douglas, MD;  Location: [facility] INVASIVE CV LAB;  Service: Cardiovascular;  Laterality: N/A;   Aortogram w/ PTA  05/14/08,  11-04-10   Bilateral aortogram w/ bilateral  SFA PTA  stenting    APPLICATION OF WOUND VAC Right 12/09/2023   Procedure: APPLICATION OF WOUND VAC;  Surgeon: Cephus Shelling, MD;  Location[facility] OR;  Service: Vascular;  Laterality: Right;   AXILLARY-FEMORAL BYPASS GRAFT Right 11/15/2023   Procedure: RIGHT AXILLO-FEMORAL BYPASS GRAFT;  Surgeon: Leonie Douglas, MD;  Location: [facility] OR;  Service: Vascular;  Laterality: Right;   AXILLARY-FEMORAL BYPASS GRAFT Right 12/09/2023   Procedure: EXCISION AXILLA-BIFEMORAL RIGHT;  Surgeon: Cephus Shelling, MD;  Location: [facility] OR;  Service: Vascular;  Laterality: Right;   BREAST SURGERY Right    boil removal   BUNIONECTOMY     L foot in the 1980s   COLONOSCOPY     2014   DILATION AND CURETTAGE OF UTERUS     X4   ENDARTERECTOMY FEMORAL Right 06/03/2018   Procedure: RIGHT FEMORAL ENDARTERECTOMY;  Surgeon: Sherren Kerns, MD;  Location: [facility] OR;  Service: Vascular;   Laterality: Right;   Epidural shots in neck      ESOPHAGOGASTRODUODENOSCOPY (EGD) WITH PROPOFOL N/A 09/15/2019   Procedure: ESOPHAGOGASTRODUODENOSCOPY (EGD) WITH PROPOFOL;  Surgeon: Willis Modena, MD;  Location: [facility] ENDOSCOPY;  Service: Endoscopy;  Laterality: N/A;   EUS N/A 09/15/2019   Procedure: UPPER ENDOSCOPIC ULTRASOUND (EUS) LINEAR;  Surgeon: Willis Modena, MD;  Location: [facility] ENDOSCOPY;  Service: Endoscopy;  Laterality: N/A;   EYE SURGERY Bilateral 2016   cataract removal   FEMORAL ARTERY EXPLORATION Right 12/09/2023   Procedure: Vein  Patch Right FEMORAL ARTERY;  Surgeon: Cephus Shelling, MD;  Location: [facility] OR;  Service: Vascular;  Laterality: Right;   FEMORAL-POPLITEAL BYPASS GRAFT Left 10/21/2013   Procedure: LEFT FEMORAL-POPLITEAL ARTERY BYPASS WITH SAPHENOUS VEIN GRAFT , POPLITEAL ENDARTERECTOMY ,INTRAOPERATIVE ARTERIOGRAM, vein patch angioplasty to popliteal artery;  Surgeon: Sherren Kerns, MD;  Location: [facility] OR;  Service: Vascular;  Laterality: Left;   FEMORAL-POPLITEAL BYPASS GRAFT Right 02/08/2016   Procedure: Right FEMORAL- to Above Knee POPLITEAL ARTERY Bypass Graft with reversed saphenous vein and Common Femoral Endarterectomy  with profundoplasty;  Surgeon: Sherren Kerns, MD;  Location: [facility] OR;  Service: Vascular;  Laterality: Right;   FINE NEEDLE ASPIRATION N/A 09/15/2019   Procedure: FINE NEEDLE ASPIRATION (FNA) LINEAR;  Surgeon: Willis Modena, MD;  Location: [facility] ENDOSCOPY;  Service: Endoscopy;  Laterality: N/A;   GROIN DEBRIDEMENT Left 10/28/2013   Procedure: left inner thigh DEBRIDEMENT;  Surgeon: Sherren Kerns, MD;  Location: [facility] OR;  Service: Vascular;  Laterality: Left;   HAND SURGERY Left 2010   IR FLUORO GUIDE CV LINE RIGHT  12/14/2023   IR REMOVAL TUN CV CATH W/O FL  01/04/2024   IR US GUIDE VASC ACCESS RIGHT  12/14/2023   LOWER EXTREMITY ANGIOGRAM Bilateral 12/24/2015   Procedure: Lower Extremity Angiogram;  Surgeon: Sherren Kerns, MD;  Location: [facility]  INVASIVE CV LAB;  Service: Cardiovascular;  Laterality: Bilateral;   PATCH ANGIOPLASTY Right 06/03/2018   Procedure: PATCH ANGIOPLASTY USING A XENOSURE 1CM X 14CM BIOLOGIC PATCH;  Surgeon: Sherren Kerns, MD;  Location: [facility] OR;  Service: Vascular;  Laterality: Right;   PERIPHERAL VASCULAR CATHETERIZATION N/A 12/24/2015   Procedure: Abdominal Aortogram;  Surgeon: Sherren Kerns, MD;  Location: [facility] INVASIVE CV LAB;  Service: Cardiovascular;  Laterality: N/A;   PERIPHERAL VASCULAR CATHETERIZATION Left 12/24/2015   Procedure: Peripheral Vascular Balloon Angioplasty;  Surgeon: Sherren Kerns, MD;  Location: [facility] INVASIVE CV LAB;  Service: Cardiovascular;  Laterality: Left;  drug coated balloon   PERIPHERAL VASCULAR CATHETERIZATION N/A 06/30/2016   Procedure: Abdominal Aortogram;  Surgeon: Sherren Kerns, MD;  Location: [facility] INVASIVE CV LAB;  Service: Cardiovascular;  Laterality: N/A;   PERIPHERAL VASCULAR CATHETERIZATION Bilateral 06/30/2016   Procedure: Lower Extremity Angiography;  Surgeon: Sherren Kerns, MD;  Location: [facility] INVASIVE CV LAB;  Service: Cardiovascular;  Laterality: Bilateral;   TONSILLECTOMY     TRANSESOPHAGEAL ECHOCARDIOGRAM (CATH LAB) N/A 12/13/2023   Procedure: TRANSESOPHAGEAL ECHOCARDIOGRAM;  Surgeon: Chilton Si, MD;  Location: [facility] INVASIVE CV LAB;  Service: Cardiovascular;  Laterality: N/A;   VEIN HARVEST Left 12/09/2023   Procedure: BACILIC VEIN HARVEST LEFT ARM;  Surgeon: Cephus Shelling, MD;  Location: [facility] OR;  Service: Vascular;  Laterality: Left;   VEIN REPAIR Right 12/09/2023   Procedure: VEIN  PATCH RIGHT AXILLIA ARTERY RIGHT;  Surgeon: Cephus Shelling, MD;  Location: [facility] OR;  Service: Vascular;  Laterality: Right;   Social History   Socioeconomic History   Marital status: Single    Spouse name: Not on file   Number of children: 1   Years of education: Not on file   Highest education level: Not on file  Occupational History   Occupation: Retired     Employer: Scientist, water quality  Tobacco Use   Smoking status: Former    Current packs/day: 0.25    Average packs/day: 0.3 packs/day for 0.6 years (0.1 ttl pk-yrs)    Types: Cigarettes    Start date: 06/2023    Quit date: 10/2014  Passive exposure: Never   Smokeless tobacco: Never   Tobacco comments:    pt states that she is using the E cig only   Vaping Use   Vaping status: Some Days  Substance and Sexual Activity   Alcohol use: Yes    Alcohol/week: 0.0 - 1.0 standard drinks of alcohol    Comment: Occasional; 10-22 rarely    Drug use: No   Sexual activity: Not on file  Other Topics Concern   Not on file  Social History Narrative   Drinks about 2 cups of coffee a day, and some tea    Social Drivers of Corporate investment banker Strain: Not on file  Food Insecurity: No Food Insecurity (12/11/2023)   Hunger Vital Sign    Worried About Running Out of Food in the Last Year: Never true    Ran Out of Food in the Last Year: Never true  Transportation Needs: No Transportation Needs (12/11/2023)   PRAPARE - Administrator, Civil Service (Medical): No    Lack of Transportation (Non-Medical): No  Physical Activity: Not on file  Stress: Not on file  Social Connections: Moderately Isolated (12/11/2023)   Social Connection and Isolation Panel [NHANES]    Frequency of Communication with Friends and Family: More than three times a week    Frequency of Social Gatherings with Friends and Family: More than three times a week    Attends Religious Services: 1 to 4 times per year    Active Member of [employer] or Organizations: No    Attends Banker Meetings: Never    Marital Status: Divorced  Catering manager Violence: Not At Risk (12/11/2023)   Humiliation, Afraid, Rape, and Kick questionnaire    Fear of Current or Ex-Partner: No    Emotionally Abused: No    Physically Abused: No    Sexually Abused: No   Current Outpatient Medications on File Prior to Visit  Medication Sig  Dispense Refill   aspirin EC 81 MG tablet Take 81 mg by mouth at bedtime.     atorvastatin (LIPITOR) 80 MG tablet Take 1 tablet (80 mg total) by mouth daily. (Patient taking differently: Take 80 mg by mouth at bedtime.) 30 tablet 11   Blood Glucose Monitoring Suppl (ONETOUCH VERIO REFLECT) w/Device KIT Use to check blood sugar 3-4 times day 1 kit 0   clopidogrel (PLAVIX) 75 MG tablet Take 1 tablet (75 mg total) by mouth daily with breakfast. 30 tablet 3   Continuous Glucose Receiver (FREESTYLE LIBRE 3 READER) DEVI Use to monitor glucose continuously. 1 each 0   Continuous Glucose Sensor (FREESTYLE LIBRE 3 PLUS SENSOR) MISC Inject 1 Device into the skin continuous. Change every 15 days 6 each 3   diclofenac Sodium (VOLTAREN) 1 % GEL Apply 1 application. topically daily as needed (leg pain).     DULoxetine (CYMBALTA) 60 MG capsule Take 60 mg by mouth daily.     ferrous sulfate 325 (65 FE) MG EC tablet Take 325 mg by mouth daily.     gabapentin (NEURONTIN) 300 MG capsule Take 1 capsule (300 mg total) by mouth 3 (three) times daily. 90 capsule 0   glucose blood (ONETOUCH VERIO) test strip Use to check blood sugar 3-4 times a day 400 each 5   insulin aspart (NOVOLOG FLEXPEN) 100 UNIT/ML FlexPen 4-8 units with every meal 15 mL 11   insulin glargine (LANTUS SOLOSTAR) 100 UNIT/ML Solostar Pen Inject 5 Units into the skin at bedtime.  Insulin Pen Needle 32G X 4 MM MISC Use 4x a day 300 each 3   latanoprost (XALATAN) 0.005 % ophthalmic solution Place 1 drop into both eyes at bedtime.     lipase/protease/amylase 24000-76000 units CPEP Take 1 capsule (24,000 Units total) by mouth 3 (three) times daily before meals. (Patient taking differently: Take 24,000-48,000 Units by mouth See admin instructions. Take 2 capsules before meals and 1 capsule after meals.) 270 capsule 0   MAGNESIUM PO Take 1 tablet by mouth daily.     metFORMIN (GLUCOPHAGE-XR) 500 MG 24 hr tablet Take 1 tablet (500 mg total) by mouth 2  (two) times daily with a meal. 180 tablet 2   Multiple Vitamins-Minerals (CENTRUM SILVER 50+WOMEN) TABS Take 1 tablet by mouth daily.     OneTouch Delica Lancets 33G MISC Check blood sugar 3-4 times a day 400 each 5   oxybutynin (DITROPAN-XL) 10 MG 24 hr tablet Take 10 mg by mouth daily.     oxyCODONE (OXY IR/ROXICODONE) 5 MG immediate release tablet Take 1-2 tablets (5-10 mg total) by mouth every 4 (four) hours as needed for moderate pain (pain score 4-6). 30 tablet 0   pantoprazole (PROTONIX) 40 MG tablet Take 40 mg by mouth every evening.     polyethylene glycol (MIRALAX / GLYCOLAX) 17 g packet Take 17 g by mouth daily as needed for mild constipation.     senna-docusate (SENOKOT-S) 8.6-50 MG tablet Take 1 tablet by mouth daily.     temazepam (RESTORIL) 30 MG capsule Take 1 capsule (30 mg total) by mouth at bedtime as needed for sleep. 30 capsule 0   timolol (TIMOPTIC) 0.5 % ophthalmic solution Place 1 drop into both eyes every morning.     traZODone (DESYREL) 50 MG tablet Take 75 mg by mouth at bedtime as needed for sleep.     No current facility-administered medications on file prior to visit.   Allergies  Allergen Reactions   Wound Dressing Adhesive Rash and Other (See Comments)    Pt states that tape and electrodes leave red "scars" on her skin and her skin is very sensitive.  Paper tape ok to use   Family History  Problem Relation Age of Onset   Heart disease Father    Heart attack Father        MI at age 63   Hypertension Mother    Alzheimer's disease Mother    Hypertension Sister    Diabetes Brother    Hyperlipidemia Brother    Hypertension Brother    Heart disease Brother    Heart attack Brother    Breast cancer Maternal Aunt    PE: BP 120/80   Pulse (!) 56   Ht 5\' 3"  (1.6 m)   Wt 107 lb (48.5 kg)   SpO2 99%   BMI 18.95 kg/m  Wt Readings from Last 3 Encounters:  01/24/24 107 lb (48.5 kg)  01/15/24 107 lb (48.5 kg)  01/02/24 111 lb (50.3 kg)   Constitutional:  thin, in NAD Eyes:  EOMI, no exophthalmos ENT: no neck masses, no cervical lymphadenopathy Cardiovascular: RRR, No MRG Respiratory: CTA B Musculoskeletal: no deformities Skin:no rashes Neurological: no tremor with outstretched hands  1. DM2, insulin-dependent, controlled, with complications - CAD - PAD - s/p stents  Component     Latest Ref Rng 04/20/2022  Glucose     65 - 99 mg/dL 295 (H)   C-Peptide     0.80 - 3.85 ng/mL 1.50     2.  Hyperlipidemia  PLAN:  1. Patient with longstanding, uncontrolled, type 2 diabetes, on oral antidiabetic regimen with metformin and also basal/bolus insulin regimen, returning sooner than the recommended appointment due to low blood sugars.  Since last visit, her insulin doses were repeatedly adjusted but she still has hypoglycemia.  Latest HbA1c was reviewed from 09/2023 and this was higher, at 9.5%. CGM interpretation: -At today's visit, we reviewed her CGM downloads: It appears that 64% of values are in target range (goal >70%), while 9% are higher than 180 (goal <25%), and 27% are lower than 70 (goal <4%).  The calculated average blood sugar is 107.  The projected HbA1c for the next 3 months (GMI) is 5.9%. -Reviewing the CGM trends, sugars are quite low in the target range and with many values under 70.  She even had an instance in which her blood sugars dropped to 27, catastrophically low, and she lost consciousness.  This value was actually reported by EMS, not by her sensor. -We discussed that the improvement in her blood sugars is likely related to the resolution of her infection after her graft surgery. -At today's visit, we will go ahead and stop the Lantus completely and I advised her to only use NovoLog as needed before larger meals, at lower doses.  She does have some hyperglycemic spikes that are quite acute in shape and I am hoping that this would improve after eliminating her lows.  I did advise her to let me know if she continues to have lows  afterwards.  Will continue metformin. - I suggested to:  Patient Instructions  Please continue: - Metformin ER 500 mg 2x a day, with meals  Stop: - Lantus   Only use: - Novolog 2-3 units 15 min before a larger meal  Please return in 2-3 months.  - we checked her HbA1c: 6.1% (much better, but likely due to the many lows) - advised to check sugars at different times of the day - 4x a day, rotating check times - advised for yearly eye exams >> she is UTD - return to clinic in 3 months  2. HL -Latest lipid panel was reviewed from 04/2023: All fractions at goal -She continues lovastatin 20 mg daily without side effects  Carlus Pavlov, MD PhD [facility]

## 2024-01-25 DIAGNOSIS — T827XXD Infection and inflammatory reaction due to other cardiac and vascular devices, implants and grafts, subsequent encounter: Secondary | ICD-10-CM | POA: Diagnosis not present

## 2024-01-25 DIAGNOSIS — I745 Embolism and thrombosis of iliac artery: Secondary | ICD-10-CM | POA: Diagnosis not present

## 2024-01-25 DIAGNOSIS — I700 Atherosclerosis of aorta: Secondary | ICD-10-CM | POA: Diagnosis not present

## 2024-01-25 DIAGNOSIS — Z794 Long term (current) use of insulin: Secondary | ICD-10-CM | POA: Diagnosis not present

## 2024-01-25 DIAGNOSIS — K219 Gastro-esophageal reflux disease without esophagitis: Secondary | ICD-10-CM | POA: Diagnosis not present

## 2024-01-25 DIAGNOSIS — G4730 Sleep apnea, unspecified: Secondary | ICD-10-CM | POA: Diagnosis not present

## 2024-01-25 DIAGNOSIS — J40 Bronchitis, not specified as acute or chronic: Secondary | ICD-10-CM | POA: Diagnosis not present

## 2024-01-25 DIAGNOSIS — Z87891 Personal history of nicotine dependence: Secondary | ICD-10-CM | POA: Diagnosis not present

## 2024-01-25 DIAGNOSIS — E1151 Type 2 diabetes mellitus with diabetic peripheral angiopathy without gangrene: Secondary | ICD-10-CM | POA: Diagnosis not present

## 2024-01-25 DIAGNOSIS — Z7982 Long term (current) use of aspirin: Secondary | ICD-10-CM | POA: Diagnosis not present

## 2024-01-25 DIAGNOSIS — I10 Essential (primary) hypertension: Secondary | ICD-10-CM | POA: Diagnosis not present

## 2024-01-25 DIAGNOSIS — H409 Unspecified glaucoma: Secondary | ICD-10-CM | POA: Diagnosis not present

## 2024-01-25 DIAGNOSIS — Z556 Problems related to health literacy: Secondary | ICD-10-CM | POA: Diagnosis not present

## 2024-01-25 DIAGNOSIS — M5020 Other cervical disc displacement, unspecified cervical region: Secondary | ICD-10-CM | POA: Diagnosis not present

## 2024-01-25 DIAGNOSIS — Z7984 Long term (current) use of oral hypoglycemic drugs: Secondary | ICD-10-CM | POA: Diagnosis not present

## 2024-01-25 DIAGNOSIS — G4700 Insomnia, unspecified: Secondary | ICD-10-CM | POA: Diagnosis not present

## 2024-01-25 DIAGNOSIS — M797 Fibromyalgia: Secondary | ICD-10-CM | POA: Diagnosis not present

## 2024-01-25 DIAGNOSIS — Z7902 Long term (current) use of antithrombotics/antiplatelets: Secondary | ICD-10-CM | POA: Diagnosis not present

## 2024-01-25 DIAGNOSIS — E785 Hyperlipidemia, unspecified: Secondary | ICD-10-CM | POA: Diagnosis not present

## 2024-01-29 DIAGNOSIS — I700 Atherosclerosis of aorta: Secondary | ICD-10-CM | POA: Diagnosis not present

## 2024-01-29 DIAGNOSIS — J40 Bronchitis, not specified as acute or chronic: Secondary | ICD-10-CM | POA: Diagnosis not present

## 2024-01-29 DIAGNOSIS — Z87891 Personal history of nicotine dependence: Secondary | ICD-10-CM | POA: Diagnosis not present

## 2024-01-29 DIAGNOSIS — K219 Gastro-esophageal reflux disease without esophagitis: Secondary | ICD-10-CM | POA: Diagnosis not present

## 2024-01-29 DIAGNOSIS — I10 Essential (primary) hypertension: Secondary | ICD-10-CM | POA: Diagnosis not present

## 2024-01-29 DIAGNOSIS — E1151 Type 2 diabetes mellitus with diabetic peripheral angiopathy without gangrene: Secondary | ICD-10-CM | POA: Diagnosis not present

## 2024-01-29 DIAGNOSIS — Z556 Problems related to health literacy: Secondary | ICD-10-CM | POA: Diagnosis not present

## 2024-01-29 DIAGNOSIS — Z794 Long term (current) use of insulin: Secondary | ICD-10-CM | POA: Diagnosis not present

## 2024-01-29 DIAGNOSIS — T827XXD Infection and inflammatory reaction due to other cardiac and vascular devices, implants and grafts, subsequent encounter: Secondary | ICD-10-CM | POA: Diagnosis not present

## 2024-01-29 DIAGNOSIS — Z7984 Long term (current) use of oral hypoglycemic drugs: Secondary | ICD-10-CM | POA: Diagnosis not present

## 2024-01-29 DIAGNOSIS — Z7982 Long term (current) use of aspirin: Secondary | ICD-10-CM | POA: Diagnosis not present

## 2024-01-29 DIAGNOSIS — Z7902 Long term (current) use of antithrombotics/antiplatelets: Secondary | ICD-10-CM | POA: Diagnosis not present

## 2024-01-29 DIAGNOSIS — E785 Hyperlipidemia, unspecified: Secondary | ICD-10-CM | POA: Diagnosis not present

## 2024-01-29 DIAGNOSIS — I745 Embolism and thrombosis of iliac artery: Secondary | ICD-10-CM | POA: Diagnosis not present

## 2024-01-29 DIAGNOSIS — G4700 Insomnia, unspecified: Secondary | ICD-10-CM | POA: Diagnosis not present

## 2024-01-29 DIAGNOSIS — M797 Fibromyalgia: Secondary | ICD-10-CM | POA: Diagnosis not present

## 2024-01-29 DIAGNOSIS — M5020 Other cervical disc displacement, unspecified cervical region: Secondary | ICD-10-CM | POA: Diagnosis not present

## 2024-01-29 DIAGNOSIS — H409 Unspecified glaucoma: Secondary | ICD-10-CM | POA: Diagnosis not present

## 2024-01-29 DIAGNOSIS — G4730 Sleep apnea, unspecified: Secondary | ICD-10-CM | POA: Diagnosis not present

## 2024-02-04 DIAGNOSIS — H409 Unspecified glaucoma: Secondary | ICD-10-CM | POA: Diagnosis not present

## 2024-02-04 DIAGNOSIS — E1165 Type 2 diabetes mellitus with hyperglycemia: Secondary | ICD-10-CM | POA: Diagnosis not present

## 2024-02-04 DIAGNOSIS — N3281 Overactive bladder: Secondary | ICD-10-CM | POA: Diagnosis not present

## 2024-02-04 DIAGNOSIS — E7800 Pure hypercholesterolemia, unspecified: Secondary | ICD-10-CM | POA: Diagnosis not present

## 2024-02-04 DIAGNOSIS — G4730 Sleep apnea, unspecified: Secondary | ICD-10-CM | POA: Diagnosis not present

## 2024-02-04 DIAGNOSIS — G4700 Insomnia, unspecified: Secondary | ICD-10-CM | POA: Diagnosis not present

## 2024-02-04 DIAGNOSIS — E785 Hyperlipidemia, unspecified: Secondary | ICD-10-CM | POA: Diagnosis not present

## 2024-02-04 DIAGNOSIS — J449 Chronic obstructive pulmonary disease, unspecified: Secondary | ICD-10-CM | POA: Diagnosis not present

## 2024-02-04 DIAGNOSIS — J40 Bronchitis, not specified as acute or chronic: Secondary | ICD-10-CM | POA: Diagnosis not present

## 2024-02-04 DIAGNOSIS — I745 Embolism and thrombosis of iliac artery: Secondary | ICD-10-CM | POA: Diagnosis not present

## 2024-02-04 DIAGNOSIS — Z9889 Other specified postprocedural states: Secondary | ICD-10-CM | POA: Diagnosis not present

## 2024-02-04 DIAGNOSIS — M5020 Other cervical disc displacement, unspecified cervical region: Secondary | ICD-10-CM | POA: Diagnosis not present

## 2024-02-04 DIAGNOSIS — Z7902 Long term (current) use of antithrombotics/antiplatelets: Secondary | ICD-10-CM | POA: Diagnosis not present

## 2024-02-04 DIAGNOSIS — Z794 Long term (current) use of insulin: Secondary | ICD-10-CM | POA: Diagnosis not present

## 2024-02-04 DIAGNOSIS — I700 Atherosclerosis of aorta: Secondary | ICD-10-CM | POA: Diagnosis not present

## 2024-02-04 DIAGNOSIS — M797 Fibromyalgia: Secondary | ICD-10-CM | POA: Diagnosis not present

## 2024-02-04 DIAGNOSIS — Z7984 Long term (current) use of oral hypoglycemic drugs: Secondary | ICD-10-CM | POA: Diagnosis not present

## 2024-02-04 DIAGNOSIS — E1151 Type 2 diabetes mellitus with diabetic peripheral angiopathy without gangrene: Secondary | ICD-10-CM | POA: Diagnosis not present

## 2024-02-04 DIAGNOSIS — I10 Essential (primary) hypertension: Secondary | ICD-10-CM | POA: Diagnosis not present

## 2024-02-04 DIAGNOSIS — Z87891 Personal history of nicotine dependence: Secondary | ICD-10-CM | POA: Diagnosis not present

## 2024-02-04 DIAGNOSIS — Z556 Problems related to health literacy: Secondary | ICD-10-CM | POA: Diagnosis not present

## 2024-02-04 DIAGNOSIS — K219 Gastro-esophageal reflux disease without esophagitis: Secondary | ICD-10-CM | POA: Diagnosis not present

## 2024-02-04 DIAGNOSIS — Z0000 Encounter for general adult medical examination without abnormal findings: Secondary | ICD-10-CM | POA: Diagnosis not present

## 2024-02-04 DIAGNOSIS — T827XXD Infection and inflammatory reaction due to other cardiac and vascular devices, implants and grafts, subsequent encounter: Secondary | ICD-10-CM | POA: Diagnosis not present

## 2024-02-04 DIAGNOSIS — I739 Peripheral vascular disease, unspecified: Secondary | ICD-10-CM | POA: Diagnosis not present

## 2024-02-04 DIAGNOSIS — Z7982 Long term (current) use of aspirin: Secondary | ICD-10-CM | POA: Diagnosis not present

## 2024-02-04 LAB — HEMOGLOBIN A1C: A1c: 6.4

## 2024-02-07 DIAGNOSIS — G4700 Insomnia, unspecified: Secondary | ICD-10-CM | POA: Diagnosis not present

## 2024-02-07 DIAGNOSIS — Z556 Problems related to health literacy: Secondary | ICD-10-CM | POA: Diagnosis not present

## 2024-02-07 DIAGNOSIS — Z794 Long term (current) use of insulin: Secondary | ICD-10-CM | POA: Diagnosis not present

## 2024-02-07 DIAGNOSIS — I745 Embolism and thrombosis of iliac artery: Secondary | ICD-10-CM | POA: Diagnosis not present

## 2024-02-07 DIAGNOSIS — M5020 Other cervical disc displacement, unspecified cervical region: Secondary | ICD-10-CM | POA: Diagnosis not present

## 2024-02-07 DIAGNOSIS — T827XXD Infection and inflammatory reaction due to other cardiac and vascular devices, implants and grafts, subsequent encounter: Secondary | ICD-10-CM | POA: Diagnosis not present

## 2024-02-07 DIAGNOSIS — J40 Bronchitis, not specified as acute or chronic: Secondary | ICD-10-CM | POA: Diagnosis not present

## 2024-02-07 DIAGNOSIS — E785 Hyperlipidemia, unspecified: Secondary | ICD-10-CM | POA: Diagnosis not present

## 2024-02-07 DIAGNOSIS — M797 Fibromyalgia: Secondary | ICD-10-CM | POA: Diagnosis not present

## 2024-02-07 DIAGNOSIS — I10 Essential (primary) hypertension: Secondary | ICD-10-CM | POA: Diagnosis not present

## 2024-02-07 DIAGNOSIS — Z7984 Long term (current) use of oral hypoglycemic drugs: Secondary | ICD-10-CM | POA: Diagnosis not present

## 2024-02-07 DIAGNOSIS — I700 Atherosclerosis of aorta: Secondary | ICD-10-CM | POA: Diagnosis not present

## 2024-02-07 DIAGNOSIS — Z7982 Long term (current) use of aspirin: Secondary | ICD-10-CM | POA: Diagnosis not present

## 2024-02-07 DIAGNOSIS — G4730 Sleep apnea, unspecified: Secondary | ICD-10-CM | POA: Diagnosis not present

## 2024-02-07 DIAGNOSIS — H409 Unspecified glaucoma: Secondary | ICD-10-CM | POA: Diagnosis not present

## 2024-02-07 DIAGNOSIS — Z7902 Long term (current) use of antithrombotics/antiplatelets: Secondary | ICD-10-CM | POA: Diagnosis not present

## 2024-02-07 DIAGNOSIS — E1151 Type 2 diabetes mellitus with diabetic peripheral angiopathy without gangrene: Secondary | ICD-10-CM | POA: Diagnosis not present

## 2024-02-07 DIAGNOSIS — Z87891 Personal history of nicotine dependence: Secondary | ICD-10-CM | POA: Diagnosis not present

## 2024-02-07 DIAGNOSIS — K219 Gastro-esophageal reflux disease without esophagitis: Secondary | ICD-10-CM | POA: Diagnosis not present

## 2024-02-11 DIAGNOSIS — I745 Embolism and thrombosis of iliac artery: Secondary | ICD-10-CM | POA: Diagnosis not present

## 2024-02-11 DIAGNOSIS — E1151 Type 2 diabetes mellitus with diabetic peripheral angiopathy without gangrene: Secondary | ICD-10-CM | POA: Diagnosis not present

## 2024-02-11 DIAGNOSIS — I700 Atherosclerosis of aorta: Secondary | ICD-10-CM | POA: Diagnosis not present

## 2024-02-11 DIAGNOSIS — Z7984 Long term (current) use of oral hypoglycemic drugs: Secondary | ICD-10-CM | POA: Diagnosis not present

## 2024-02-11 DIAGNOSIS — M797 Fibromyalgia: Secondary | ICD-10-CM | POA: Diagnosis not present

## 2024-02-11 DIAGNOSIS — K219 Gastro-esophageal reflux disease without esophagitis: Secondary | ICD-10-CM | POA: Diagnosis not present

## 2024-02-11 DIAGNOSIS — H409 Unspecified glaucoma: Secondary | ICD-10-CM | POA: Diagnosis not present

## 2024-02-11 DIAGNOSIS — M5020 Other cervical disc displacement, unspecified cervical region: Secondary | ICD-10-CM | POA: Diagnosis not present

## 2024-02-11 DIAGNOSIS — Z556 Problems related to health literacy: Secondary | ICD-10-CM | POA: Diagnosis not present

## 2024-02-11 DIAGNOSIS — E785 Hyperlipidemia, unspecified: Secondary | ICD-10-CM | POA: Diagnosis not present

## 2024-02-11 DIAGNOSIS — Z7982 Long term (current) use of aspirin: Secondary | ICD-10-CM | POA: Diagnosis not present

## 2024-02-11 DIAGNOSIS — J40 Bronchitis, not specified as acute or chronic: Secondary | ICD-10-CM | POA: Diagnosis not present

## 2024-02-11 DIAGNOSIS — I10 Essential (primary) hypertension: Secondary | ICD-10-CM | POA: Diagnosis not present

## 2024-02-11 DIAGNOSIS — G4730 Sleep apnea, unspecified: Secondary | ICD-10-CM | POA: Diagnosis not present

## 2024-02-11 DIAGNOSIS — Z794 Long term (current) use of insulin: Secondary | ICD-10-CM | POA: Diagnosis not present

## 2024-02-11 DIAGNOSIS — T827XXD Infection and inflammatory reaction due to other cardiac and vascular devices, implants and grafts, subsequent encounter: Secondary | ICD-10-CM | POA: Diagnosis not present

## 2024-02-11 DIAGNOSIS — Z7902 Long term (current) use of antithrombotics/antiplatelets: Secondary | ICD-10-CM | POA: Diagnosis not present

## 2024-02-11 DIAGNOSIS — Z87891 Personal history of nicotine dependence: Secondary | ICD-10-CM | POA: Diagnosis not present

## 2024-02-11 DIAGNOSIS — G4700 Insomnia, unspecified: Secondary | ICD-10-CM | POA: Diagnosis not present

## 2024-02-11 NOTE — Progress Notes (Unsigned)
 [facility] OF [place]  ASSESSMENT / PLAN: Jill Higgins is a 78 y.o. female with complex personal vascular history with bilateral lower extremity intervention.  Recent angiography performed to evaluate right lower extremity possible inflow stenosis unrevealing but was complicated by flow-limiting arterial dissection.  This was managed intraoperatively with covered stenting 04/20/23.  Recommend:  Abstinence from all tobacco products. Blood glucose control with goal A1c < 7%. Blood pressure control with goal blood pressure < 140/90 mmHg. Lipid reduction therapy with goal LDL-C <100 mg/dL. Aspirin 81mg  PO QD.  Clopidogrel 75mg  PO QD. Atorvastatin 40-80mg  PO QD (or other "high intensity" statin therapy).  I again reviewed her options in detail.  I think repeat open vascular surgery is a bad idea at this time.  The patient is in agreement.  We will try exercise therapy.  I will see her again in 3 to 6 months.  CHIEF COMPLAINT: follow up angiogram  HISTORY OF PRESENT ILLNESS: Jill Higgins is a 78 y.o. female with complex past vascular history who returns to clinic after angiogram done for right lower extremity pain.  Preangiogram noninvasive testing suggested possible right external iliac artery stenosis and possible left leg bypass stenosis.  Neither was identified on angiogram.  Her angiogram was complicated by closure device malfunction which was successfully managed with covered stenting.  The patient is doing reasonably well on my evaluation.  She continues to have right hip and knee pain.  She recently sustained a fall and had some x-rays made.  The pain is not typical of claudication.  She describes point tenderness in her hip.  I do not think she walks fast enough to claudicate.  She does not have any rest pain in her foot.  She has no ulcers.  09/25/23: Patient returns to clinic for follow-up.  She was initially doing well after covered stenting of right external  iliac artery dissection, but has unfortunately developed short distance claudication.  The patient has no pain at rest.  She has no ulcers about her feet.  Her noninvasive studies were performed earlier today and reviewed in person.  02/12/2024: patient returns to discuss her CT angiogram findings.  I again reviewed the high perioperative risk of anatomic reconstruction.  I counseled her that extra-anatomic reconstruction would expose her to similar risks especially infection.  Ultimately, she would like to avoid any intervention if at all possible.  Her symptoms still remain very frustrating to her, for which I provided sympathy.  VASCULAR SURGICAL HISTORY:  Left fem-popliteal vein bypass in December of 2014 by Dr. Darrick Penna, a Right femoral to above knee popliteal vein bypass in March of 2017 Redo right femoral endarterectomy in July of 2019 Angiogram, right external iliac artery stenting May 2024   Past Medical History:  Diagnosis Date   Bronchitis    Bulging disc    in neck   Depression with anxiety    Diabetes mellitus    Type II   Fibromyalgia    pt. denies   GERD (gastroesophageal reflux disease)    Glaucoma    History of bronchitis    Hyperlipidemia    Hypertension    Overactive bladder    Peripheral vascular disease (HCC)    Pneumonia    Sleep apnea    recent test negative for sleep apnea   Tobacco abuse    Urinary frequency     Past Surgical History:  Procedure Laterality Date   ABDOMINAL AORTAGRAM N/A 08/15/2013   Procedure: ABDOMINAL AORTAGRAM;  Surgeon: Sherren Kerns, MD;  Location: [facility] CATH LAB;  Service: Cardiovascular;  Laterality: N/A;   ABDOMINAL AORTAGRAM N/A 06/19/2014   Procedure: ABDOMINAL Ronny Flurry;  Surgeon: Sherren Kerns, MD;  Location: [facility] CATH LAB;  Service: Cardiovascular;  Laterality: N/A;   ABDOMINAL AORTOGRAM W/LOWER EXTREMITY N/A 05/10/2018   Procedure: ABDOMINAL AORTOGRAM W/LOWER EXTREMITY;  Surgeon: Sherren Kerns, MD;  Location: [facility] INVASIVE  CV LAB;  Service: Cardiovascular;  Laterality: N/A;  bilateral   ABDOMINAL AORTOGRAM W/LOWER EXTREMITY N/A 04/20/2023   Procedure: ABDOMINAL AORTOGRAM W/LOWER EXTREMITY;  Surgeon: Leonie Douglas, MD;  Location: [facility] INVASIVE CV LAB;  Service: Cardiovascular;  Laterality: N/A;   Aortogram w/ PTA  05/14/08,  11-04-10   Bilateral aortogram w/ bilateral  SFA PTA  stenting    APPLICATION OF WOUND VAC Right 12/09/2023   Procedure: APPLICATION OF WOUND VAC;  Surgeon: Cephus Shelling, MD;  Location: [facility] OR;  Service: Vascular;  Laterality: Right;   AXILLARY-FEMORAL BYPASS GRAFT Right 11/15/2023   Procedure: RIGHT AXILLO-FEMORAL BYPASS GRAFT;  Surgeon: Leonie Douglas, MD;  Location: [facility] OR;  Service: Vascular;  Laterality: Right;   AXILLARY-FEMORAL BYPASS GRAFT Right 12/09/2023   Procedure: EXCISION AXILLA-BIFEMORAL RIGHT;  Surgeon: Cephus Shelling, MD;  Location: [facility] OR;  Service: Vascular;  Laterality: Right;   BREAST SURGERY Right    boil removal   BUNIONECTOMY     L foot in the 1980s   COLONOSCOPY     2014   DILATION AND CURETTAGE OF UTERUS     X4   ENDARTERECTOMY FEMORAL Right 06/03/2018   Procedure: RIGHT FEMORAL ENDARTERECTOMY;  Surgeon: Sherren Kerns, MD;  Location: [facility] OR;  Service: Vascular;  Laterality: Right;   Epidural shots in neck      ESOPHAGOGASTRODUODENOSCOPY (EGD) WITH PROPOFOL N/A 09/15/2019   Procedure: ESOPHAGOGASTRODUODENOSCOPY (EGD) WITH PROPOFOL;  Surgeon: Willis Modena, MD;  Location: [facility] ENDOSCOPY;  Service: Endoscopy;  Laterality: N/A;   EUS N/A 09/15/2019   Procedure: UPPER ENDOSCOPIC ULTRASOUND (EUS) LINEAR;  Surgeon: Willis Modena, MD;  Location: [facility] ENDOSCOPY;  Service: Endoscopy;  Laterality: N/A;   EYE SURGERY Bilateral 2016   cataract removal   FEMORAL ARTERY EXPLORATION Right 12/09/2023   Procedure: Vein Patch Right FEMORAL ARTERY;  Surgeon: Cephus Shelling, MD;  Location: [facility] OR;  Service: Vascular;  Laterality: Right;   FEMORAL-POPLITEAL BYPASS  GRAFT Left 10/21/2013   Procedure: LEFT FEMORAL-POPLITEAL ARTERY BYPASS WITH SAPHENOUS VEIN GRAFT , POPLITEAL ENDARTERECTOMY ,INTRAOPERATIVE ARTERIOGRAM, vein patch angioplasty to popliteal artery;  Surgeon: Sherren Kerns, MD;  Location: [facility] OR;  Service: Vascular;  Laterality: Left;   FEMORAL-POPLITEAL BYPASS GRAFT Right 02/08/2016   Procedure: Right FEMORAL- to Above Knee POPLITEAL ARTERY Bypass Graft with reversed saphenous vein and Common Femoral Endarterectomy  with profundoplasty;  Surgeon: Sherren Kerns, MD;  Location: [facility] OR;  Service: Vascular;  Laterality: Right;   FINE NEEDLE ASPIRATION N/A 09/15/2019   Procedure: FINE NEEDLE ASPIRATION (FNA) LINEAR;  Surgeon: Willis Modena, MD;  Location: [facility] ENDOSCOPY;  Service: Endoscopy;  Laterality: N/A;   GROIN DEBRIDEMENT Left 10/28/2013   Procedure: left inner thigh DEBRIDEMENT;  Surgeon: Sherren Kerns, MD;  Location: [facility] OR;  Service: Vascular;  Laterality: Left;   HAND SURGERY Left 2010   IR FLUORO GUIDE CV LINE RIGHT  12/14/2023   IR REMOVAL TUN CV CATH W/O FL  01/04/2024   IR US GUIDE VASC ACCESS RIGHT  12/14/2023   LOWER EXTREMITY ANGIOGRAM Bilateral 12/24/2015   Procedure:  Lower Extremity Angiogram;  Surgeon: Sherren Kerns, MD;  Location: [facility] INVASIVE CV LAB;  Service: Cardiovascular;  Laterality: Bilateral;   PATCH ANGIOPLASTY Right 06/03/2018   Procedure: PATCH ANGIOPLASTY USING A XENOSURE 1CM X 14CM BIOLOGIC PATCH;  Surgeon: Sherren Kerns, MD;  Location: [facility] OR;  Service: Vascular;  Laterality: Right;   PERIPHERAL VASCULAR CATHETERIZATION N/A 12/24/2015   Procedure: Abdominal Aortogram;  Surgeon: Sherren Kerns, MD;  Location: [facility] INVASIVE CV LAB;  Service: Cardiovascular;  Laterality: N/A;   PERIPHERAL VASCULAR CATHETERIZATION Left 12/24/2015   Procedure: Peripheral Vascular Balloon Angioplasty;  Surgeon: Sherren Kerns, MD;  Location: [facility] INVASIVE CV LAB;  Service: Cardiovascular;  Laterality: Left;  drug coated balloon   PERIPHERAL  VASCULAR CATHETERIZATION N/A 06/30/2016   Procedure: Abdominal Aortogram;  Surgeon: Sherren Kerns, MD;  Location: [facility] INVASIVE CV LAB;  Service: Cardiovascular;  Laterality: N/A;   PERIPHERAL VASCULAR CATHETERIZATION Bilateral 06/30/2016   Procedure: Lower Extremity Angiography;  Surgeon: Sherren Kerns, MD;  Location: [facility] INVASIVE CV LAB;  Service: Cardiovascular;  Laterality: Bilateral;   TONSILLECTOMY     TRANSESOPHAGEAL ECHOCARDIOGRAM (CATH LAB) N/A 12/13/2023   Procedure: TRANSESOPHAGEAL ECHOCARDIOGRAM;  Surgeon: Chilton Si, MD;  Location: [facility] INVASIVE CV LAB;  Service: Cardiovascular;  Laterality: N/A;   VEIN HARVEST Left 12/09/2023   Procedure: BACILIC VEIN HARVEST LEFT ARM;  Surgeon: Cephus Shelling, MD;  Location: [facility] OR;  Service: Vascular;  Laterality: Left;   VEIN REPAIR Right 12/09/2023   Procedure: VEIN  PATCH RIGHT AXILLIA ARTERY RIGHT;  Surgeon: Cephus Shelling, MD;  Location: [facility] OR;  Service: Vascular;  Laterality: Right;    Family History  Problem Relation Age of Onset   Heart disease Father    Heart attack Father        MI at age 74   Hypertension Mother    Alzheimer's disease Mother    Hypertension Sister    Diabetes Brother    Hyperlipidemia Brother    Hypertension Brother    Heart disease Brother    Heart attack Brother    Breast cancer Maternal Aunt     Social History   Socioeconomic History   Marital status: Single    Spouse name: Not on file   Number of children: 1   Years of education: Not on file   Highest education level: Not on file  Occupational History   Occupation: Retired    Associate Professor: Scientist, water quality  Tobacco Use   Smoking status: Former    Current packs/day: 0.25    Average packs/day: 0.3 packs/day for 0.6 years (0.2 ttl pk-yrs)    Types: Cigarettes    Start date: 06/2023    Quit date: 10/2014    Passive exposure: Never   Smokeless tobacco: Never   Tobacco comments:    pt states that she is using the E cig only   Vaping Use    Vaping status: Some Days  Substance and Sexual Activity   Alcohol use: Yes    Alcohol/week: 0.0 - 1.0 standard drinks of alcohol    Comment: Occasional; 10-22 rarely    Drug use: No   Sexual activity: Not on file  Other Topics Concern   Not on file  Social History Narrative   Drinks about 2 cups of coffee a day, and some tea    Social Drivers of Corporate investment banker Strain: Not on file  Food Insecurity: No Food Insecurity (12/11/2023)   Hunger Vital Sign  Worried About Programme researcher, broadcasting/film/video in the Last Year: Never true    Ran Out of Food in the Last Year: Never true  Transportation Needs: No Transportation Needs (12/11/2023)   PRAPARE - Administrator, Civil Service (Medical): No    Lack of Transportation (Non-Medical): No  Physical Activity: Not on file  Stress: Not on file  Social Connections: Moderately Isolated (12/11/2023)   Social Connection and Isolation Panel [NHANES]    Frequency of Communication with Friends and Family: More than three times a week    Frequency of Social Gatherings with Friends and Family: More than three times a week    Attends Religious Services: 1 to 4 times per year    Active Member of [employer] or Organizations: No    Attends Banker Meetings: Never    Marital Status: Divorced  Catering manager Violence: Not At Risk (12/11/2023)   Humiliation, Afraid, Rape, and Kick questionnaire    Fear of Current or Ex-Partner: No    Emotionally Abused: No    Physically Abused: No    Sexually Abused: No    Allergies  Allergen Reactions   Wound Dressing Adhesive Rash and Other (See Comments)    Pt states that tape and electrodes leave red "scars" on her skin and her skin is very sensitive.  Paper tape ok to use    Current Outpatient Medications  Medication Sig Dispense Refill   aspirin EC 81 MG tablet Take 81 mg by mouth at bedtime.     atorvastatin (LIPITOR) 80 MG tablet Take 1 tablet (80 mg total) by mouth daily. (Patient  taking differently: Take 80 mg by mouth at bedtime.) 30 tablet 11   Blood Glucose Monitoring Suppl (ONETOUCH VERIO REFLECT) w/Device KIT Use to check blood sugar 3-4 times day 1 kit 0   clopidogrel (PLAVIX) 75 MG tablet Take 1 tablet (75 mg total) by mouth daily with breakfast. 30 tablet 3   Continuous Glucose Receiver (FREESTYLE LIBRE 3 READER) DEVI Use to monitor glucose continuously. 1 each 0   Continuous Glucose Sensor (FREESTYLE LIBRE 3 PLUS SENSOR) MISC Inject 1 Device into the skin continuous. Change every 15 days 6 each 3   diclofenac Sodium (VOLTAREN) 1 % GEL Apply 1 application. topically daily as needed (leg pain).     DULoxetine (CYMBALTA) 60 MG capsule Take 60 mg by mouth daily.     ferrous sulfate 325 (65 FE) MG EC tablet Take 325 mg by mouth daily.     gabapentin (NEURONTIN) 300 MG capsule Take 1 capsule (300 mg total) by mouth 3 (three) times daily. 90 capsule 0   glucose blood (ONETOUCH VERIO) test strip Use to check blood sugar 3-4 times a day 400 each 5   insulin aspart (NOVOLOG FLEXPEN) 100 UNIT/ML FlexPen 4-8 units with every meal (Patient taking differently: 2-4 units) 15 mL 11   insulin glargine (LANTUS SOLOSTAR) 100 UNIT/ML Solostar Pen Inject 5 Units into the skin at bedtime. (Patient taking differently: Inject 10 Units into the skin at bedtime. 10 units daily)     Insulin Pen Needle 32G X 4 MM MISC Use 4x a day 300 each 3   latanoprost (XALATAN) 0.005 % ophthalmic solution Place 1 drop into both eyes at bedtime.     lipase/protease/amylase 24000-76000 units CPEP Take 1 capsule (24,000 Units total) by mouth 3 (three) times daily before meals. (Patient taking differently: Take 24,000-48,000 Units by mouth See admin instructions. Take 2 capsules before meals and  1 capsule after meals.) 270 capsule 0   MAGNESIUM PO Take 1 tablet by mouth daily.     metFORMIN (GLUCOPHAGE-XR) 500 MG 24 hr tablet Take 1 tablet (500 mg total) by mouth 2 (two) times daily with a meal. 180 tablet 2    Multiple Vitamins-Minerals (CENTRUM SILVER 50+WOMEN) TABS Take 1 tablet by mouth daily.     OneTouch Delica Lancets 33G MISC Check blood sugar 3-4 times a day 400 each 5   oxybutynin (DITROPAN-XL) 10 MG 24 hr tablet Take 10 mg by mouth daily.     oxyCODONE (OXY IR/ROXICODONE) 5 MG immediate release tablet Take 1-2 tablets (5-10 mg total) by mouth every 4 (four) hours as needed for moderate pain (pain score 4-6). 30 tablet 0   pantoprazole (PROTONIX) 40 MG tablet Take 40 mg by mouth every evening.     polyethylene glycol (MIRALAX / GLYCOLAX) 17 g packet Take 17 g by mouth daily as needed for mild constipation.     senna-docusate (SENOKOT-S) 8.6-50 MG tablet Take 1 tablet by mouth daily.     temazepam (RESTORIL) 30 MG capsule Take 1 capsule (30 mg total) by mouth at bedtime as needed for sleep. 30 capsule 0   timolol (TIMOPTIC) 0.5 % ophthalmic solution Place 1 drop into both eyes every morning.     traZODone (DESYREL) 50 MG tablet Take 75 mg by mouth at bedtime as needed for sleep.     No current facility-administered medications for this visit.    PHYSICAL EXAM There were no vitals filed for this visit.   Thin elderly woman in no acute distress Regular rate and rhythm Unlabored breathing No palpable pedal pulses   PERTINENT LABORATORY AND RADIOLOGIC DATA  Most recent CBC    Latest Ref Rng & Units 12/19/2023    4:15 AM 12/18/2023    5:00 AM 12/17/2023    5:10 AM  CBC  WBC 4.0 - 10.5 K/uL 8.9  7.6  6.6   Hemoglobin 12.0 - 15.0 g/dL 9.8  9.0  8.7   Hematocrit 36.0 - 46.0 % 30.0  27.6  26.6   Platelets 150 - 400 K/uL 501  410  368      Most recent CMP    Latest Ref Rng & Units 12/19/2023    4:15 AM 12/18/2023    5:00 AM 12/17/2023    5:10 AM  CMP  Glucose 70 - 99 mg/dL 952  841  324   BUN 8 - 23 mg/dL 9  8  7    Creatinine 0.44 - 1.00 mg/dL 4.01  0.27  2.53   Sodium 135 - 145 mmol/L 136  136  133   Potassium 3.5 - 5.1 mmol/L 4.4  4.0  3.6   Chloride 98 - 111 mmol/L 103  103   104   CO2 22 - 32 mmol/L 24  25  23    Calcium 8.9 - 10.3 mg/dL 8.3  8.0  7.6   Total Protein 6.5 - 8.1 g/dL 5.4  4.9  4.8   Total Bilirubin 0.0 - 1.2 mg/dL 0.4  0.4  0.4   Alkaline Phos 38 - 126 U/L 86  74  74   AST 15 - 41 U/L 27  34  21   ALT 0 - 44 U/L 19  19  17      Renal function CrCl cannot be calculated (Patient's most recent lab result is older than the maximum 21 days allowed.).  Hemoglobin A1C  Date Value  01/24/2024 6.1 % (  A)  05/13/2020 8.5    LDL Chol Calc (NIH)  Date Value Ref Range Status  02/08/2022 21 0 - 99 mg/dL Final   LDL Cholesterol  Date Value Ref Range Status  12/10/2023 15 0 - 99 mg/dL Final    Comment:           Total Cholesterol/HDL:CHD Risk Coronary Heart Disease Risk Table                     Men   Women  1/2 Average Risk   3.4   3.3  Average Risk       5.0   4.4  2 X Average Risk   9.6   7.1  3 X Average Risk  23.4   11.0        Use the calculated Patient Ratio above and the CHD Risk Table to determine the patient's CHD Risk.        ATP III CLASSIFICATION (LDL):  <100     mg/dL   Optimal  160-109  mg/dL   Near or Above                    Optimal  130-159  mg/dL   Borderline  323-557  mg/dL   High  >322     mg/dL   Very High Performed at [facility], [address] [place], [place] [address]       Stenosis:  Patent right CIA/stent with no stenosis. The EIA/stent appears occluded  with a collateral vessel present.    +-------+-----------+-----------+------------+------------+  ABI/TBIToday's ABIToday's TBIPrevious ABIPrevious TBI  +-------+-----------+-----------+------------+------------+  Right 0.5        0.22       0.95        0.88          +-------+-----------+-----------+------------+------------+  Left  1.06       0.6        0.99        0.61          +-------+-----------+-----------+------------+------------+   CT angiogram of abdomen and pelvis  VASCULAR   1. Surgical changes of bilateral  femoropopliteal bypass grafts which remain patent. 2. Chronic occlusion of the right external iliac artery and the bilateral superficial femoral arteries. 3. Chronic occlusion of the left posterior tibial artery with 2 vessel runoff to the ankle. 4. On the right, the popliteal artery is small in caliber and diseased but there is three-vessel runoff to the ankle. 5. Moderate stenosis of the origin of the celiac artery. 6. Moderate stenosis of the origin of the SMA. 7.  Aortic Atherosclerosis (ICD10-I70.0).   NON-VASCULAR   1. Diffuse submucosal edema of the descending colon slightly increased compared to prior imaging. Differential considerations include chronic infectious/inflammatory colitis, or less likely colonic neoplasm. Recommend non emergent referral to gastroenterology for further evaluation. 2. Additional ancillary findings as above without significant interval change.      Rande Brunt. Lenell Antu, MD FACS [facility] Phone Number: [phone] 02/11/2024 7:34 AM   Total time spent on preparing this encounter including chart review, data review, collecting history, examining the patient, coordinating care for this established patient, 30 minutes.  Portions of this report may have been transcribed using voice recognition software.  Every effort has been made to ensure accuracy; however, inadvertent computerized transcription errors may still be present.

## 2024-02-12 ENCOUNTER — Encounter: Payer: Self-pay | Admitting: Vascular Surgery

## 2024-02-12 ENCOUNTER — Ambulatory Visit (HOSPITAL_COMMUNITY)
Admission: RE | Admit: 2024-02-12 | Discharge: 2024-02-12 | Disposition: A | Discharge status: Home / Self Care | Source: Ambulatory Visit | Attending: Vascular Surgery | Admitting: Vascular Surgery

## 2024-02-12 ENCOUNTER — Other Ambulatory Visit (HOSPITAL_COMMUNITY): Payer: Self-pay | Admitting: Vascular Surgery

## 2024-02-12 ENCOUNTER — Ambulatory Visit: Payer: Medicare Other | Admitting: Vascular Surgery

## 2024-02-12 VITALS — BP 123/79 | HR 56 | Temp 97.8°F | Ht 63.0 in | Wt 105.0 lb

## 2024-02-12 DIAGNOSIS — R609 Edema, unspecified: Secondary | ICD-10-CM | POA: Diagnosis not present

## 2024-02-12 DIAGNOSIS — I739 Peripheral vascular disease, unspecified: Secondary | ICD-10-CM

## 2024-02-13 ENCOUNTER — Other Ambulatory Visit: Payer: Self-pay | Admitting: Vascular Surgery

## 2024-02-13 DIAGNOSIS — Z556 Problems related to health literacy: Secondary | ICD-10-CM | POA: Diagnosis not present

## 2024-02-13 DIAGNOSIS — J40 Bronchitis, not specified as acute or chronic: Secondary | ICD-10-CM | POA: Diagnosis not present

## 2024-02-13 DIAGNOSIS — H409 Unspecified glaucoma: Secondary | ICD-10-CM | POA: Diagnosis not present

## 2024-02-13 DIAGNOSIS — E785 Hyperlipidemia, unspecified: Secondary | ICD-10-CM | POA: Diagnosis not present

## 2024-02-13 DIAGNOSIS — M5020 Other cervical disc displacement, unspecified cervical region: Secondary | ICD-10-CM | POA: Diagnosis not present

## 2024-02-13 DIAGNOSIS — I745 Embolism and thrombosis of iliac artery: Secondary | ICD-10-CM | POA: Diagnosis not present

## 2024-02-13 DIAGNOSIS — Z87891 Personal history of nicotine dependence: Secondary | ICD-10-CM | POA: Diagnosis not present

## 2024-02-13 DIAGNOSIS — G4730 Sleep apnea, unspecified: Secondary | ICD-10-CM | POA: Diagnosis not present

## 2024-02-13 DIAGNOSIS — Z7902 Long term (current) use of antithrombotics/antiplatelets: Secondary | ICD-10-CM | POA: Diagnosis not present

## 2024-02-13 DIAGNOSIS — G4700 Insomnia, unspecified: Secondary | ICD-10-CM | POA: Diagnosis not present

## 2024-02-13 DIAGNOSIS — I10 Essential (primary) hypertension: Secondary | ICD-10-CM | POA: Diagnosis not present

## 2024-02-13 DIAGNOSIS — T827XXD Infection and inflammatory reaction due to other cardiac and vascular devices, implants and grafts, subsequent encounter: Secondary | ICD-10-CM | POA: Diagnosis not present

## 2024-02-13 DIAGNOSIS — Z7982 Long term (current) use of aspirin: Secondary | ICD-10-CM | POA: Diagnosis not present

## 2024-02-13 DIAGNOSIS — E1151 Type 2 diabetes mellitus with diabetic peripheral angiopathy without gangrene: Secondary | ICD-10-CM | POA: Diagnosis not present

## 2024-02-13 DIAGNOSIS — I700 Atherosclerosis of aorta: Secondary | ICD-10-CM | POA: Diagnosis not present

## 2024-02-13 DIAGNOSIS — M797 Fibromyalgia: Secondary | ICD-10-CM | POA: Diagnosis not present

## 2024-02-13 DIAGNOSIS — Z7984 Long term (current) use of oral hypoglycemic drugs: Secondary | ICD-10-CM | POA: Diagnosis not present

## 2024-02-13 DIAGNOSIS — Z794 Long term (current) use of insulin: Secondary | ICD-10-CM | POA: Diagnosis not present

## 2024-02-13 DIAGNOSIS — K219 Gastro-esophageal reflux disease without esophagitis: Secondary | ICD-10-CM | POA: Diagnosis not present

## 2024-02-13 MED ORDER — APIXABAN (ELIQUIS) VTE STARTER PACK (10MG AND 5MG): ORAL_TABLET | ORAL | 0 refills | Status: DC

## 2024-02-14 ENCOUNTER — Telehealth: Payer: Self-pay | Admitting: *Deleted

## 2024-02-14 ENCOUNTER — Telehealth (HOSPITAL_COMMUNITY): Payer: Self-pay

## 2024-02-14 NOTE — Telephone Encounter (Signed)
 Patient called and left message stating she can't afford the medication eliquis that Dr Lenell Antu prescribed. Sent a message to Dr Lenell Antu waiting for his response.

## 2024-02-14 NOTE — Telephone Encounter (Signed)
 Received referral from Dr. Aliene Beams for this pt to participate in the supervised exercise therapy program with the diagnosis of Atherosclerosis of the native arteries of right leg with intermittent claudication.     Pt seen in follow up on 02/13/24. The patient received information regarding cardiovascular disease and PAD risk factor reduction, which could include education, counseling, behavior interventions, and outcome assessments.   Pt had ABI completed on 01/15/24 which showed 0.47 on her right side and 1.05 on her left.  Called pt to inform her of Supervised Exercise Therapy for the treatment of claudication. Pt reports she is interested in the program and would like to participate. Will work on getting her in the program ASAP  Faustino Congress MS ACSM-CEP  02/14/2024 3:12 PM

## 2024-02-15 ENCOUNTER — Other Ambulatory Visit: Payer: Self-pay | Admitting: *Deleted

## 2024-02-15 DIAGNOSIS — Z95828 Presence of other vascular implants and grafts: Secondary | ICD-10-CM

## 2024-02-15 DIAGNOSIS — I739 Peripheral vascular disease, unspecified: Secondary | ICD-10-CM

## 2024-02-16 DIAGNOSIS — G4730 Sleep apnea, unspecified: Secondary | ICD-10-CM | POA: Diagnosis not present

## 2024-02-16 DIAGNOSIS — K219 Gastro-esophageal reflux disease without esophagitis: Secondary | ICD-10-CM | POA: Diagnosis not present

## 2024-02-16 DIAGNOSIS — Z7984 Long term (current) use of oral hypoglycemic drugs: Secondary | ICD-10-CM | POA: Diagnosis not present

## 2024-02-16 DIAGNOSIS — M5020 Other cervical disc displacement, unspecified cervical region: Secondary | ICD-10-CM | POA: Diagnosis not present

## 2024-02-16 DIAGNOSIS — I10 Essential (primary) hypertension: Secondary | ICD-10-CM | POA: Diagnosis not present

## 2024-02-16 DIAGNOSIS — Z794 Long term (current) use of insulin: Secondary | ICD-10-CM | POA: Diagnosis not present

## 2024-02-16 DIAGNOSIS — T827XXD Infection and inflammatory reaction due to other cardiac and vascular devices, implants and grafts, subsequent encounter: Secondary | ICD-10-CM | POA: Diagnosis not present

## 2024-02-16 DIAGNOSIS — H409 Unspecified glaucoma: Secondary | ICD-10-CM | POA: Diagnosis not present

## 2024-02-16 DIAGNOSIS — Z7902 Long term (current) use of antithrombotics/antiplatelets: Secondary | ICD-10-CM | POA: Diagnosis not present

## 2024-02-16 DIAGNOSIS — E1151 Type 2 diabetes mellitus with diabetic peripheral angiopathy without gangrene: Secondary | ICD-10-CM | POA: Diagnosis not present

## 2024-02-16 DIAGNOSIS — Z556 Problems related to health literacy: Secondary | ICD-10-CM | POA: Diagnosis not present

## 2024-02-16 DIAGNOSIS — G4700 Insomnia, unspecified: Secondary | ICD-10-CM | POA: Diagnosis not present

## 2024-02-16 DIAGNOSIS — I745 Embolism and thrombosis of iliac artery: Secondary | ICD-10-CM | POA: Diagnosis not present

## 2024-02-16 DIAGNOSIS — Z87891 Personal history of nicotine dependence: Secondary | ICD-10-CM | POA: Diagnosis not present

## 2024-02-16 DIAGNOSIS — M797 Fibromyalgia: Secondary | ICD-10-CM | POA: Diagnosis not present

## 2024-02-16 DIAGNOSIS — Z7982 Long term (current) use of aspirin: Secondary | ICD-10-CM | POA: Diagnosis not present

## 2024-02-16 DIAGNOSIS — E785 Hyperlipidemia, unspecified: Secondary | ICD-10-CM | POA: Diagnosis not present

## 2024-02-16 DIAGNOSIS — I700 Atherosclerosis of aorta: Secondary | ICD-10-CM | POA: Diagnosis not present

## 2024-02-16 DIAGNOSIS — J40 Bronchitis, not specified as acute or chronic: Secondary | ICD-10-CM | POA: Diagnosis not present

## 2024-02-18 ENCOUNTER — Telehealth (HOSPITAL_COMMUNITY): Payer: Self-pay | Admitting: Student-PharmD

## 2024-02-18 ENCOUNTER — Other Ambulatory Visit (HOSPITAL_COMMUNITY): Payer: Self-pay

## 2024-02-18 NOTE — Telephone Encounter (Signed)
 Received message from Dr. Lenell Antu to see if we can help this patient afford her Eliquis. Test billing shows cost of Eliquis is currently $295.91. $248.91 is contributing toward her deductible. Once this is met, her copay will be $47/month.   Called and spoke with patient and explained the above. She says that $47/month would be affordable for her, but she is unable to pay the nearly $300 up front to make it through her deductible. She has been on Eliquis before so she has likely used the once per lifetime free copay card. Either way, this won't contribute toward her deductible and she will still need to pay through it to get to her regular copays. At this point, she has not paid 3% of her annual income out of pocket on medications, so she would not qualify for patient assistance.   Discussed that Medicare now offers the Medicare Prescription Payment Plan as of 2025, which allows her to split up her medication costs and spread them across the year. She would not owe anything at her pharmacy, but she would get a bill from her [place] Medicare plan that she must pay monthly to remain enrolled in the plan. Under this, the deductible would be spread throughout the year rather than owing it all up front. She said she would be able to do that. She could pay the $300 throughout the year in addition to the $47 copay, she just can't pay $300 up front at the pharmacy. This would solve that issue. She is aware this would apply to her other medications as well. Explained that she can sign up for this program by calling [employer] and they would enroll her in that. There may be a short lag with when the program becomes active (she will ask [place] about this when she calls) then she would be able to pick up Eliquis from her pharmacy with no charge and then pay the bill [employer] sends later. We also discussed that Medicare eliminated the donut hole this year, so her costs will not go up at the end of the year, and that  Medicare has decreased the max out of pocket for patients from $8000 to $2000. In other words, if she has paid $2000 on all of her medications before the end of the year, she would not owe anything after that.   She plans to call her insurance immediately after our call to enroll in the [facility] Prescription Payment Plan, then she will pick up Eliquis as soon as the enrollment is active. Provided patient with my call back number should she have any questions or issues.

## 2024-02-19 ENCOUNTER — Telehealth (HOSPITAL_COMMUNITY): Payer: Self-pay

## 2024-02-19 DIAGNOSIS — Z556 Problems related to health literacy: Secondary | ICD-10-CM | POA: Diagnosis not present

## 2024-02-19 DIAGNOSIS — E1151 Type 2 diabetes mellitus with diabetic peripheral angiopathy without gangrene: Secondary | ICD-10-CM | POA: Diagnosis not present

## 2024-02-19 DIAGNOSIS — M797 Fibromyalgia: Secondary | ICD-10-CM | POA: Diagnosis not present

## 2024-02-19 DIAGNOSIS — G4700 Insomnia, unspecified: Secondary | ICD-10-CM | POA: Diagnosis not present

## 2024-02-19 DIAGNOSIS — Z87891 Personal history of nicotine dependence: Secondary | ICD-10-CM | POA: Diagnosis not present

## 2024-02-19 DIAGNOSIS — I745 Embolism and thrombosis of iliac artery: Secondary | ICD-10-CM | POA: Diagnosis not present

## 2024-02-19 DIAGNOSIS — Z7984 Long term (current) use of oral hypoglycemic drugs: Secondary | ICD-10-CM | POA: Diagnosis not present

## 2024-02-19 DIAGNOSIS — Z7982 Long term (current) use of aspirin: Secondary | ICD-10-CM | POA: Diagnosis not present

## 2024-02-19 DIAGNOSIS — E785 Hyperlipidemia, unspecified: Secondary | ICD-10-CM | POA: Diagnosis not present

## 2024-02-19 DIAGNOSIS — I10 Essential (primary) hypertension: Secondary | ICD-10-CM | POA: Diagnosis not present

## 2024-02-19 DIAGNOSIS — T827XXD Infection and inflammatory reaction due to other cardiac and vascular devices, implants and grafts, subsequent encounter: Secondary | ICD-10-CM | POA: Diagnosis not present

## 2024-02-19 DIAGNOSIS — Z7902 Long term (current) use of antithrombotics/antiplatelets: Secondary | ICD-10-CM | POA: Diagnosis not present

## 2024-02-19 DIAGNOSIS — G4730 Sleep apnea, unspecified: Secondary | ICD-10-CM | POA: Diagnosis not present

## 2024-02-19 DIAGNOSIS — J40 Bronchitis, not specified as acute or chronic: Secondary | ICD-10-CM | POA: Diagnosis not present

## 2024-02-19 DIAGNOSIS — M5020 Other cervical disc displacement, unspecified cervical region: Secondary | ICD-10-CM | POA: Diagnosis not present

## 2024-02-19 DIAGNOSIS — I700 Atherosclerosis of aorta: Secondary | ICD-10-CM | POA: Diagnosis not present

## 2024-02-19 DIAGNOSIS — H409 Unspecified glaucoma: Secondary | ICD-10-CM | POA: Diagnosis not present

## 2024-02-19 DIAGNOSIS — K219 Gastro-esophageal reflux disease without esophagitis: Secondary | ICD-10-CM | POA: Diagnosis not present

## 2024-02-19 DIAGNOSIS — Z794 Long term (current) use of insulin: Secondary | ICD-10-CM | POA: Diagnosis not present

## 2024-02-19 NOTE — Telephone Encounter (Signed)
 Pt insurance is active and benefits verified through [facility]. Co-pay $0.00, DED $0.00/$0.00 met, out of pocket $0.00/$0.00 met, co-insurance 0%. No pre-authorization required. [employer], 02/19/24, ION#[id number]

## 2024-02-22 ENCOUNTER — Encounter (HOSPITAL_COMMUNITY): Payer: Self-pay

## 2024-02-22 ENCOUNTER — Telehealth (HOSPITAL_COMMUNITY): Payer: Self-pay

## 2024-02-22 NOTE — Telephone Encounter (Signed)
 Attempted to call patient in regards to SET program - LM on VM    Mailed letter

## 2024-02-25 DIAGNOSIS — E785 Hyperlipidemia, unspecified: Secondary | ICD-10-CM | POA: Diagnosis not present

## 2024-02-25 DIAGNOSIS — G4730 Sleep apnea, unspecified: Secondary | ICD-10-CM | POA: Diagnosis not present

## 2024-02-25 DIAGNOSIS — M5020 Other cervical disc displacement, unspecified cervical region: Secondary | ICD-10-CM | POA: Diagnosis not present

## 2024-02-25 DIAGNOSIS — Z87891 Personal history of nicotine dependence: Secondary | ICD-10-CM | POA: Diagnosis not present

## 2024-02-25 DIAGNOSIS — J40 Bronchitis, not specified as acute or chronic: Secondary | ICD-10-CM | POA: Diagnosis not present

## 2024-02-25 DIAGNOSIS — Z794 Long term (current) use of insulin: Secondary | ICD-10-CM | POA: Diagnosis not present

## 2024-02-25 DIAGNOSIS — Z7902 Long term (current) use of antithrombotics/antiplatelets: Secondary | ICD-10-CM | POA: Diagnosis not present

## 2024-02-25 DIAGNOSIS — H409 Unspecified glaucoma: Secondary | ICD-10-CM | POA: Diagnosis not present

## 2024-02-25 DIAGNOSIS — Z7982 Long term (current) use of aspirin: Secondary | ICD-10-CM | POA: Diagnosis not present

## 2024-02-25 DIAGNOSIS — E1151 Type 2 diabetes mellitus with diabetic peripheral angiopathy without gangrene: Secondary | ICD-10-CM | POA: Diagnosis not present

## 2024-02-25 DIAGNOSIS — G4700 Insomnia, unspecified: Secondary | ICD-10-CM | POA: Diagnosis not present

## 2024-02-25 DIAGNOSIS — I10 Essential (primary) hypertension: Secondary | ICD-10-CM | POA: Diagnosis not present

## 2024-02-25 DIAGNOSIS — M797 Fibromyalgia: Secondary | ICD-10-CM | POA: Diagnosis not present

## 2024-02-25 DIAGNOSIS — I700 Atherosclerosis of aorta: Secondary | ICD-10-CM | POA: Diagnosis not present

## 2024-02-25 DIAGNOSIS — K219 Gastro-esophageal reflux disease without esophagitis: Secondary | ICD-10-CM | POA: Diagnosis not present

## 2024-02-25 DIAGNOSIS — Z556 Problems related to health literacy: Secondary | ICD-10-CM | POA: Diagnosis not present

## 2024-02-25 DIAGNOSIS — T827XXD Infection and inflammatory reaction due to other cardiac and vascular devices, implants and grafts, subsequent encounter: Secondary | ICD-10-CM | POA: Diagnosis not present

## 2024-02-25 DIAGNOSIS — Z7984 Long term (current) use of oral hypoglycemic drugs: Secondary | ICD-10-CM | POA: Diagnosis not present

## 2024-02-25 DIAGNOSIS — I745 Embolism and thrombosis of iliac artery: Secondary | ICD-10-CM | POA: Diagnosis not present

## 2024-02-26 ENCOUNTER — Other Ambulatory Visit: Payer: Self-pay

## 2024-02-26 MED ORDER — FREESTYLE LIBRE 3 PLUS SENSOR MISC: 1.0000 | 3 refills | Status: DC

## 2024-02-27 ENCOUNTER — Other Ambulatory Visit (HOSPITAL_COMMUNITY): Payer: Self-pay

## 2024-02-27 ENCOUNTER — Telehealth: Payer: Self-pay

## 2024-02-27 DIAGNOSIS — I745 Embolism and thrombosis of iliac artery: Secondary | ICD-10-CM | POA: Diagnosis not present

## 2024-02-27 DIAGNOSIS — Z794 Long term (current) use of insulin: Secondary | ICD-10-CM | POA: Diagnosis not present

## 2024-02-27 DIAGNOSIS — Z7984 Long term (current) use of oral hypoglycemic drugs: Secondary | ICD-10-CM | POA: Diagnosis not present

## 2024-02-27 DIAGNOSIS — K219 Gastro-esophageal reflux disease without esophagitis: Secondary | ICD-10-CM | POA: Diagnosis not present

## 2024-02-27 DIAGNOSIS — J40 Bronchitis, not specified as acute or chronic: Secondary | ICD-10-CM | POA: Diagnosis not present

## 2024-02-27 DIAGNOSIS — I10 Essential (primary) hypertension: Secondary | ICD-10-CM | POA: Diagnosis not present

## 2024-02-27 DIAGNOSIS — M797 Fibromyalgia: Secondary | ICD-10-CM | POA: Diagnosis not present

## 2024-02-27 DIAGNOSIS — I700 Atherosclerosis of aorta: Secondary | ICD-10-CM | POA: Diagnosis not present

## 2024-02-27 DIAGNOSIS — E1151 Type 2 diabetes mellitus with diabetic peripheral angiopathy without gangrene: Secondary | ICD-10-CM | POA: Diagnosis not present

## 2024-02-27 DIAGNOSIS — G4730 Sleep apnea, unspecified: Secondary | ICD-10-CM | POA: Diagnosis not present

## 2024-02-27 DIAGNOSIS — Z556 Problems related to health literacy: Secondary | ICD-10-CM | POA: Diagnosis not present

## 2024-02-27 DIAGNOSIS — E785 Hyperlipidemia, unspecified: Secondary | ICD-10-CM | POA: Diagnosis not present

## 2024-02-27 DIAGNOSIS — M5020 Other cervical disc displacement, unspecified cervical region: Secondary | ICD-10-CM | POA: Diagnosis not present

## 2024-02-27 DIAGNOSIS — T827XXD Infection and inflammatory reaction due to other cardiac and vascular devices, implants and grafts, subsequent encounter: Secondary | ICD-10-CM | POA: Diagnosis not present

## 2024-02-27 DIAGNOSIS — Z87891 Personal history of nicotine dependence: Secondary | ICD-10-CM | POA: Diagnosis not present

## 2024-02-27 DIAGNOSIS — Z7982 Long term (current) use of aspirin: Secondary | ICD-10-CM | POA: Diagnosis not present

## 2024-02-27 DIAGNOSIS — H409 Unspecified glaucoma: Secondary | ICD-10-CM | POA: Diagnosis not present

## 2024-02-27 DIAGNOSIS — G4700 Insomnia, unspecified: Secondary | ICD-10-CM | POA: Diagnosis not present

## 2024-02-27 DIAGNOSIS — Z7902 Long term (current) use of antithrombotics/antiplatelets: Secondary | ICD-10-CM | POA: Diagnosis not present

## 2024-02-27 NOTE — Telephone Encounter (Signed)
 Pharmacy Patient Advocate Encounter   Received notification from CoverMyMeds that prior authorization for Freestyle libre 3 plus is required/requested.   Insurance verification completed.   The patient is insured through [facility] .   Per test claim: PA required; PA submitted to above mentioned insurance via CoverMyMeds Key/confirmation #/EOC [id number] Status is pending

## 2024-02-28 ENCOUNTER — Ambulatory Visit: Admitting: Podiatry

## 2024-02-28 ENCOUNTER — Encounter: Payer: Self-pay | Admitting: Podiatry

## 2024-02-28 ENCOUNTER — Telehealth (HOSPITAL_COMMUNITY): Payer: Self-pay

## 2024-02-28 VITALS — Ht 63.0 in | Wt 105.0 lb

## 2024-02-28 DIAGNOSIS — E1159 Type 2 diabetes mellitus with other circulatory complications: Secondary | ICD-10-CM | POA: Diagnosis not present

## 2024-02-28 DIAGNOSIS — L600 Ingrowing nail: Secondary | ICD-10-CM | POA: Diagnosis not present

## 2024-02-28 MED ORDER — MUPIROCIN 2 % EX OINT: 1.0000 | TOPICAL_OINTMENT | Freq: Two times a day (BID) | CUTANEOUS | 0 refills | Status: AC

## 2024-02-28 NOTE — Progress Notes (Signed)
  Subjective:  Patient ID: Jill Higgins, female    DOB: 07/20/1946,  MRN: [id number]  Chief Complaint  Patient presents with   Foot Pain    " I am still having some pain in my right big toe and it throbs like a toothache"    78 y.o. female with pain to right first toe medial border.  The toe is exquisitely tender.  Patient is diabetic with significant documented vascular disease.  Recent visit with vascular surgeon Dr. Edgardo Goodwill states that she is not a good candidate for open revascularization at this time.  Patient denies swelling, redness or drainage to the toe.  Objective:  Physical Exam: warm to cool, good capillary refill, pedal hair growth absent, pedal skin atrophic nail exam onychomycosis of the toenails and onycholysis Right DP and PT nonpalpable, left DP and PT faintly palpable, protective sensation intact, and vibratory sensation diminished.  Reports subjective neuropathy symptoms Right hallux tender on palpation distal aspect and to medial nail border.  There is incurvation of the nail.  No erythema, edema or drainage today. Mild bunion deformities with adjacent hammertoes right greater than left with toes pressing against each other  Radiographs: Right foot 3 views weightbearing 01/24/2024 Moderate bunion deformity with elevated first ray noted, adjacent hammertoe contracture.  History of tailor's bunionectomy noted   Assessment:   1. Ingrown toenail of right foot   2. Type 2 diabetes mellitus with vascular disease (HCC)      Plan:  Patient was evaluated and treated and all questions answered.  Patient is not a good candidate for invasive ingrown toenail removal due to her vascular status.  Aggressive slant back nail trim of the medial nail border performed today.  No signs of underlying infection noted.  This was done without incident.  Dressed with bacitracin and bandage.  The patient can apply Neosporin to the affected nail border for the next 5 to 7 days.  Return in  about 2 weeks (around 03/13/2024), or if symptoms worsen or fail to improve, for Nail Check.         Eve Hinders, DPM [facility] / [facility]

## 2024-02-28 NOTE — Telephone Encounter (Signed)
 Called to continue conversation about SET program however no answer. Will f/u when possible.

## 2024-03-02 ENCOUNTER — Encounter: Payer: Self-pay | Admitting: Podiatry

## 2024-03-03 DIAGNOSIS — E1151 Type 2 diabetes mellitus with diabetic peripheral angiopathy without gangrene: Secondary | ICD-10-CM | POA: Diagnosis not present

## 2024-03-03 DIAGNOSIS — I745 Embolism and thrombosis of iliac artery: Secondary | ICD-10-CM | POA: Diagnosis not present

## 2024-03-03 DIAGNOSIS — Z7902 Long term (current) use of antithrombotics/antiplatelets: Secondary | ICD-10-CM | POA: Diagnosis not present

## 2024-03-03 DIAGNOSIS — G4730 Sleep apnea, unspecified: Secondary | ICD-10-CM | POA: Diagnosis not present

## 2024-03-03 DIAGNOSIS — M5020 Other cervical disc displacement, unspecified cervical region: Secondary | ICD-10-CM | POA: Diagnosis not present

## 2024-03-03 DIAGNOSIS — I700 Atherosclerosis of aorta: Secondary | ICD-10-CM | POA: Diagnosis not present

## 2024-03-03 DIAGNOSIS — I10 Essential (primary) hypertension: Secondary | ICD-10-CM | POA: Diagnosis not present

## 2024-03-03 DIAGNOSIS — H409 Unspecified glaucoma: Secondary | ICD-10-CM | POA: Diagnosis not present

## 2024-03-03 DIAGNOSIS — T827XXD Infection and inflammatory reaction due to other cardiac and vascular devices, implants and grafts, subsequent encounter: Secondary | ICD-10-CM | POA: Diagnosis not present

## 2024-03-03 DIAGNOSIS — Z794 Long term (current) use of insulin: Secondary | ICD-10-CM | POA: Diagnosis not present

## 2024-03-03 DIAGNOSIS — Z7982 Long term (current) use of aspirin: Secondary | ICD-10-CM | POA: Diagnosis not present

## 2024-03-03 DIAGNOSIS — K219 Gastro-esophageal reflux disease without esophagitis: Secondary | ICD-10-CM | POA: Diagnosis not present

## 2024-03-03 DIAGNOSIS — J40 Bronchitis, not specified as acute or chronic: Secondary | ICD-10-CM | POA: Diagnosis not present

## 2024-03-03 DIAGNOSIS — M797 Fibromyalgia: Secondary | ICD-10-CM | POA: Diagnosis not present

## 2024-03-03 DIAGNOSIS — Z7984 Long term (current) use of oral hypoglycemic drugs: Secondary | ICD-10-CM | POA: Diagnosis not present

## 2024-03-03 DIAGNOSIS — Z87891 Personal history of nicotine dependence: Secondary | ICD-10-CM | POA: Diagnosis not present

## 2024-03-03 DIAGNOSIS — E785 Hyperlipidemia, unspecified: Secondary | ICD-10-CM | POA: Diagnosis not present

## 2024-03-03 DIAGNOSIS — G4700 Insomnia, unspecified: Secondary | ICD-10-CM | POA: Diagnosis not present

## 2024-03-03 DIAGNOSIS — Z556 Problems related to health literacy: Secondary | ICD-10-CM | POA: Diagnosis not present

## 2024-03-06 DIAGNOSIS — M5020 Other cervical disc displacement, unspecified cervical region: Secondary | ICD-10-CM | POA: Diagnosis not present

## 2024-03-06 DIAGNOSIS — H409 Unspecified glaucoma: Secondary | ICD-10-CM | POA: Diagnosis not present

## 2024-03-06 DIAGNOSIS — I700 Atherosclerosis of aorta: Secondary | ICD-10-CM | POA: Diagnosis not present

## 2024-03-06 DIAGNOSIS — I10 Essential (primary) hypertension: Secondary | ICD-10-CM | POA: Diagnosis not present

## 2024-03-06 DIAGNOSIS — G4700 Insomnia, unspecified: Secondary | ICD-10-CM | POA: Diagnosis not present

## 2024-03-06 DIAGNOSIS — Z7902 Long term (current) use of antithrombotics/antiplatelets: Secondary | ICD-10-CM | POA: Diagnosis not present

## 2024-03-06 DIAGNOSIS — Z7984 Long term (current) use of oral hypoglycemic drugs: Secondary | ICD-10-CM | POA: Diagnosis not present

## 2024-03-06 DIAGNOSIS — E1151 Type 2 diabetes mellitus with diabetic peripheral angiopathy without gangrene: Secondary | ICD-10-CM | POA: Diagnosis not present

## 2024-03-06 DIAGNOSIS — G4730 Sleep apnea, unspecified: Secondary | ICD-10-CM | POA: Diagnosis not present

## 2024-03-06 DIAGNOSIS — T827XXD Infection and inflammatory reaction due to other cardiac and vascular devices, implants and grafts, subsequent encounter: Secondary | ICD-10-CM | POA: Diagnosis not present

## 2024-03-06 DIAGNOSIS — E785 Hyperlipidemia, unspecified: Secondary | ICD-10-CM | POA: Diagnosis not present

## 2024-03-06 DIAGNOSIS — J40 Bronchitis, not specified as acute or chronic: Secondary | ICD-10-CM | POA: Diagnosis not present

## 2024-03-06 DIAGNOSIS — M797 Fibromyalgia: Secondary | ICD-10-CM | POA: Diagnosis not present

## 2024-03-06 DIAGNOSIS — Z556 Problems related to health literacy: Secondary | ICD-10-CM | POA: Diagnosis not present

## 2024-03-06 DIAGNOSIS — K219 Gastro-esophageal reflux disease without esophagitis: Secondary | ICD-10-CM | POA: Diagnosis not present

## 2024-03-06 DIAGNOSIS — I745 Embolism and thrombosis of iliac artery: Secondary | ICD-10-CM | POA: Diagnosis not present

## 2024-03-06 DIAGNOSIS — Z87891 Personal history of nicotine dependence: Secondary | ICD-10-CM | POA: Diagnosis not present

## 2024-03-06 DIAGNOSIS — Z7982 Long term (current) use of aspirin: Secondary | ICD-10-CM | POA: Diagnosis not present

## 2024-03-06 DIAGNOSIS — Z794 Long term (current) use of insulin: Secondary | ICD-10-CM | POA: Diagnosis not present

## 2024-03-07 ENCOUNTER — Other Ambulatory Visit: Payer: Self-pay | Admitting: Internal Medicine

## 2024-03-11 DIAGNOSIS — E785 Hyperlipidemia, unspecified: Secondary | ICD-10-CM | POA: Diagnosis not present

## 2024-03-11 DIAGNOSIS — Z7902 Long term (current) use of antithrombotics/antiplatelets: Secondary | ICD-10-CM | POA: Diagnosis not present

## 2024-03-11 DIAGNOSIS — M797 Fibromyalgia: Secondary | ICD-10-CM | POA: Diagnosis not present

## 2024-03-11 DIAGNOSIS — J40 Bronchitis, not specified as acute or chronic: Secondary | ICD-10-CM | POA: Diagnosis not present

## 2024-03-11 DIAGNOSIS — I700 Atherosclerosis of aorta: Secondary | ICD-10-CM | POA: Diagnosis not present

## 2024-03-11 DIAGNOSIS — K219 Gastro-esophageal reflux disease without esophagitis: Secondary | ICD-10-CM | POA: Diagnosis not present

## 2024-03-11 DIAGNOSIS — Z556 Problems related to health literacy: Secondary | ICD-10-CM | POA: Diagnosis not present

## 2024-03-11 DIAGNOSIS — I10 Essential (primary) hypertension: Secondary | ICD-10-CM | POA: Diagnosis not present

## 2024-03-11 DIAGNOSIS — G4730 Sleep apnea, unspecified: Secondary | ICD-10-CM | POA: Diagnosis not present

## 2024-03-11 DIAGNOSIS — Z794 Long term (current) use of insulin: Secondary | ICD-10-CM | POA: Diagnosis not present

## 2024-03-11 DIAGNOSIS — M5020 Other cervical disc displacement, unspecified cervical region: Secondary | ICD-10-CM | POA: Diagnosis not present

## 2024-03-11 DIAGNOSIS — Z87891 Personal history of nicotine dependence: Secondary | ICD-10-CM | POA: Diagnosis not present

## 2024-03-11 DIAGNOSIS — H409 Unspecified glaucoma: Secondary | ICD-10-CM | POA: Diagnosis not present

## 2024-03-11 DIAGNOSIS — E1151 Type 2 diabetes mellitus with diabetic peripheral angiopathy without gangrene: Secondary | ICD-10-CM | POA: Diagnosis not present

## 2024-03-11 DIAGNOSIS — T827XXD Infection and inflammatory reaction due to other cardiac and vascular devices, implants and grafts, subsequent encounter: Secondary | ICD-10-CM | POA: Diagnosis not present

## 2024-03-11 DIAGNOSIS — G4700 Insomnia, unspecified: Secondary | ICD-10-CM | POA: Diagnosis not present

## 2024-03-11 DIAGNOSIS — Z7984 Long term (current) use of oral hypoglycemic drugs: Secondary | ICD-10-CM | POA: Diagnosis not present

## 2024-03-11 DIAGNOSIS — Z7982 Long term (current) use of aspirin: Secondary | ICD-10-CM | POA: Diagnosis not present

## 2024-03-11 DIAGNOSIS — I745 Embolism and thrombosis of iliac artery: Secondary | ICD-10-CM | POA: Diagnosis not present

## 2024-03-13 DIAGNOSIS — G4700 Insomnia, unspecified: Secondary | ICD-10-CM | POA: Diagnosis not present

## 2024-03-13 DIAGNOSIS — Z7984 Long term (current) use of oral hypoglycemic drugs: Secondary | ICD-10-CM | POA: Diagnosis not present

## 2024-03-13 DIAGNOSIS — Z87891 Personal history of nicotine dependence: Secondary | ICD-10-CM | POA: Diagnosis not present

## 2024-03-13 DIAGNOSIS — I10 Essential (primary) hypertension: Secondary | ICD-10-CM | POA: Diagnosis not present

## 2024-03-13 DIAGNOSIS — Z7982 Long term (current) use of aspirin: Secondary | ICD-10-CM | POA: Diagnosis not present

## 2024-03-13 DIAGNOSIS — K219 Gastro-esophageal reflux disease without esophagitis: Secondary | ICD-10-CM | POA: Diagnosis not present

## 2024-03-13 DIAGNOSIS — Z556 Problems related to health literacy: Secondary | ICD-10-CM | POA: Diagnosis not present

## 2024-03-13 DIAGNOSIS — M797 Fibromyalgia: Secondary | ICD-10-CM | POA: Diagnosis not present

## 2024-03-13 DIAGNOSIS — J40 Bronchitis, not specified as acute or chronic: Secondary | ICD-10-CM | POA: Diagnosis not present

## 2024-03-13 DIAGNOSIS — M5020 Other cervical disc displacement, unspecified cervical region: Secondary | ICD-10-CM | POA: Diagnosis not present

## 2024-03-13 DIAGNOSIS — Z794 Long term (current) use of insulin: Secondary | ICD-10-CM | POA: Diagnosis not present

## 2024-03-13 DIAGNOSIS — G4730 Sleep apnea, unspecified: Secondary | ICD-10-CM | POA: Diagnosis not present

## 2024-03-13 DIAGNOSIS — E785 Hyperlipidemia, unspecified: Secondary | ICD-10-CM | POA: Diagnosis not present

## 2024-03-13 DIAGNOSIS — I700 Atherosclerosis of aorta: Secondary | ICD-10-CM | POA: Diagnosis not present

## 2024-03-13 DIAGNOSIS — H409 Unspecified glaucoma: Secondary | ICD-10-CM | POA: Diagnosis not present

## 2024-03-13 DIAGNOSIS — I745 Embolism and thrombosis of iliac artery: Secondary | ICD-10-CM | POA: Diagnosis not present

## 2024-03-13 DIAGNOSIS — E1151 Type 2 diabetes mellitus with diabetic peripheral angiopathy without gangrene: Secondary | ICD-10-CM | POA: Diagnosis not present

## 2024-03-13 DIAGNOSIS — T827XXD Infection and inflammatory reaction due to other cardiac and vascular devices, implants and grafts, subsequent encounter: Secondary | ICD-10-CM | POA: Diagnosis not present

## 2024-03-13 DIAGNOSIS — Z7902 Long term (current) use of antithrombotics/antiplatelets: Secondary | ICD-10-CM | POA: Diagnosis not present

## 2024-03-17 DIAGNOSIS — E785 Hyperlipidemia, unspecified: Secondary | ICD-10-CM | POA: Diagnosis not present

## 2024-03-17 DIAGNOSIS — Z7982 Long term (current) use of aspirin: Secondary | ICD-10-CM | POA: Diagnosis not present

## 2024-03-17 DIAGNOSIS — E1151 Type 2 diabetes mellitus with diabetic peripheral angiopathy without gangrene: Secondary | ICD-10-CM | POA: Diagnosis not present

## 2024-03-17 DIAGNOSIS — Z7902 Long term (current) use of antithrombotics/antiplatelets: Secondary | ICD-10-CM | POA: Diagnosis not present

## 2024-03-17 DIAGNOSIS — I10 Essential (primary) hypertension: Secondary | ICD-10-CM | POA: Diagnosis not present

## 2024-03-17 DIAGNOSIS — Z794 Long term (current) use of insulin: Secondary | ICD-10-CM | POA: Diagnosis not present

## 2024-03-17 DIAGNOSIS — M5020 Other cervical disc displacement, unspecified cervical region: Secondary | ICD-10-CM | POA: Diagnosis not present

## 2024-03-17 DIAGNOSIS — M797 Fibromyalgia: Secondary | ICD-10-CM | POA: Diagnosis not present

## 2024-03-17 DIAGNOSIS — K219 Gastro-esophageal reflux disease without esophagitis: Secondary | ICD-10-CM | POA: Diagnosis not present

## 2024-03-17 DIAGNOSIS — Z87891 Personal history of nicotine dependence: Secondary | ICD-10-CM | POA: Diagnosis not present

## 2024-03-17 DIAGNOSIS — G4700 Insomnia, unspecified: Secondary | ICD-10-CM | POA: Diagnosis not present

## 2024-03-17 DIAGNOSIS — G4730 Sleep apnea, unspecified: Secondary | ICD-10-CM | POA: Diagnosis not present

## 2024-03-17 DIAGNOSIS — Z556 Problems related to health literacy: Secondary | ICD-10-CM | POA: Diagnosis not present

## 2024-03-17 DIAGNOSIS — H409 Unspecified glaucoma: Secondary | ICD-10-CM | POA: Diagnosis not present

## 2024-03-17 DIAGNOSIS — T827XXD Infection and inflammatory reaction due to other cardiac and vascular devices, implants and grafts, subsequent encounter: Secondary | ICD-10-CM | POA: Diagnosis not present

## 2024-03-17 DIAGNOSIS — Z7984 Long term (current) use of oral hypoglycemic drugs: Secondary | ICD-10-CM | POA: Diagnosis not present

## 2024-03-17 DIAGNOSIS — J40 Bronchitis, not specified as acute or chronic: Secondary | ICD-10-CM | POA: Diagnosis not present

## 2024-03-17 DIAGNOSIS — I700 Atherosclerosis of aorta: Secondary | ICD-10-CM | POA: Diagnosis not present

## 2024-03-17 DIAGNOSIS — I745 Embolism and thrombosis of iliac artery: Secondary | ICD-10-CM | POA: Diagnosis not present

## 2024-03-20 ENCOUNTER — Ambulatory Visit: Admitting: Podiatry

## 2024-03-20 ENCOUNTER — Encounter: Payer: Self-pay | Admitting: Podiatry

## 2024-03-20 DIAGNOSIS — M79606 Pain in leg, unspecified: Secondary | ICD-10-CM

## 2024-03-20 DIAGNOSIS — E1159 Type 2 diabetes mellitus with other circulatory complications: Secondary | ICD-10-CM | POA: Diagnosis not present

## 2024-03-20 DIAGNOSIS — I998 Other disorder of circulatory system: Secondary | ICD-10-CM | POA: Diagnosis not present

## 2024-03-20 NOTE — Progress Notes (Unsigned)
 Chief Complaint  Patient presents with   Nail Problem    Patient states that she is having sharp and throbbing pain in her right foot, since last visit. Patient is taking medication for pain but it is not helping with pain.     HPI: 78 y.o. female presents today status post slant back nail trim the right hallux medial border.  No signs of acute infection here.  She returns due to persistent pain now involving all of the toes of the right foot extending proximally and even involving the heel.  He reports this all the time and at rest.  She is starting to get similar pains of the left foot as well.  When ambulating she reports claudication type pain after minimal ambulation.  Past Medical History:  Diagnosis Date   Bronchitis    Bulging disc    in neck   Depression with anxiety    Diabetes mellitus    Type II   Fibromyalgia    pt. denies   GERD (gastroesophageal reflux disease)    Glaucoma    History of bronchitis    Hyperlipidemia    Hypertension    Overactive bladder    Peripheral vascular disease (HCC)    Pneumonia    Sleep apnea    recent test negative for sleep apnea   Tobacco abuse    Urinary frequency     Past Surgical History:  Procedure Laterality Date   ABDOMINAL AORTAGRAM N/A 08/15/2013   Procedure: ABDOMINAL Tommi Fraise;  Surgeon: Richrd Char, MD;  Location: [facility] CATH LAB;  Service: Cardiovascular;  Laterality: N/A;   ABDOMINAL AORTAGRAM N/A 06/19/2014   Procedure: ABDOMINAL Tommi Fraise;  Surgeon: Richrd Char, MD;  Location: [facility] CATH LAB;  Service: Cardiovascular;  Laterality: N/A;   ABDOMINAL AORTOGRAM W/LOWER EXTREMITY N/A 05/10/2018   Procedure: ABDOMINAL AORTOGRAM W/LOWER EXTREMITY;  Surgeon: Richrd Char, MD;  Location: [facility] INVASIVE CV LAB;  Service: Cardiovascular;  Laterality: N/A;  bilateral   ABDOMINAL AORTOGRAM W/LOWER EXTREMITY N/A 04/20/2023   Procedure: ABDOMINAL AORTOGRAM W/LOWER EXTREMITY;  Surgeon: Carlene Che, MD;  Location: [facility]  INVASIVE CV LAB;  Service: Cardiovascular;  Laterality: N/A;   Aortogram w/ PTA  05/14/08,  11-04-10   Bilateral aortogram w/ bilateral  SFA PTA  stenting    APPLICATION OF WOUND VAC Right 12/09/2023   Procedure: APPLICATION OF WOUND VAC;  Surgeon: Young Hensen, MD;  Location: [facility] OR;  Service: Vascular;  Laterality: Right;   AXILLARY-FEMORAL BYPASS GRAFT Right 11/15/2023   Procedure: RIGHT AXILLO-FEMORAL BYPASS GRAFT;  Surgeon: Carlene Che, MD;  Location: [facility] OR;  Service: Vascular;  Laterality: Right;   AXILLARY-FEMORAL BYPASS GRAFT Right 12/09/2023   Procedure: EXCISION AXILLA-BIFEMORAL RIGHT;  Surgeon: Young Hensen, MD;  Location: [facility] OR;  Service: Vascular;  Laterality: Right;   BREAST SURGERY Right    boil removal   BUNIONECTOMY     L foot in the 1980s   COLONOSCOPY     2014   DILATION AND CURETTAGE OF UTERUS     X4   ENDARTERECTOMY FEMORAL Right 06/03/2018   Procedure: RIGHT FEMORAL ENDARTERECTOMY;  Surgeon: Richrd Char, MD;  Location: [facility] OR;  Service: Vascular;  Laterality: Right;   Epidural shots in neck      ESOPHAGOGASTRODUODENOSCOPY (EGD) WITH PROPOFOL  N/A 09/15/2019   Procedure: ESOPHAGOGASTRODUODENOSCOPY (EGD) WITH PROPOFOL ;  Surgeon: Evangeline Hilts, MD;  Location: [facility] ENDOSCOPY;  Service: Endoscopy;  Laterality: N/A;   EUS  N/A 09/15/2019   Procedure: UPPER ENDOSCOPIC ULTRASOUND (EUS) LINEAR;  Surgeon: Evangeline Hilts, MD;  Location: [facility] ENDOSCOPY;  Service: Endoscopy;  Laterality: N/A;   EYE SURGERY Bilateral 2016   cataract removal   FEMORAL ARTERY EXPLORATION Right 12/09/2023   Procedure: Vein Patch Right FEMORAL ARTERY;  Surgeon: Young Hensen, MD;  Location: [facility] OR;  Service: Vascular;  Laterality: Right;   FEMORAL-POPLITEAL BYPASS GRAFT Left 10/21/2013   Procedure: LEFT FEMORAL-POPLITEAL ARTERY BYPASS WITH SAPHENOUS VEIN GRAFT , POPLITEAL ENDARTERECTOMY ,INTRAOPERATIVE ARTERIOGRAM, vein patch angioplasty to popliteal artery;  Surgeon: Richrd Char, MD;  Location: [facility] OR;  Service: Vascular;  Laterality: Left;   FEMORAL-POPLITEAL BYPASS GRAFT Right 02/08/2016   Procedure: Right FEMORAL- to Above Knee POPLITEAL ARTERY Bypass Graft with reversed saphenous vein and Common Femoral Endarterectomy  with profundoplasty;  Surgeon: Richrd Char, MD;  Location: [facility] OR;  Service: Vascular;  Laterality: Right;   FINE NEEDLE ASPIRATION N/A 09/15/2019   Procedure: FINE NEEDLE ASPIRATION (FNA) LINEAR;  Surgeon: Evangeline Hilts, MD;  Location: [facility] ENDOSCOPY;  Service: Endoscopy;  Laterality: N/A;   GROIN DEBRIDEMENT Left 10/28/2013   Procedure: left inner thigh DEBRIDEMENT;  Surgeon: Richrd Char, MD;  Location: [facility] OR;  Service: Vascular;  Laterality: Left;   HAND SURGERY Left 2010   IR FLUORO GUIDE CV LINE RIGHT  12/14/2023   IR REMOVAL TUN CV CATH W/O FL  01/04/2024   IR US  GUIDE VASC ACCESS RIGHT  12/14/2023   LOWER EXTREMITY ANGIOGRAM Bilateral 12/24/2015   Procedure: Lower Extremity Angiogram;  Surgeon: Richrd Char, MD;  Location: [facility] INVASIVE CV LAB;  Service: Cardiovascular;  Laterality: Bilateral;   PATCH ANGIOPLASTY Right 06/03/2018   Procedure: PATCH ANGIOPLASTY USING A XENOSURE 1CM X 14CM BIOLOGIC PATCH;  Surgeon: Richrd Char, MD;  Location: [facility] OR;  Service: Vascular;  Laterality: Right;   PERIPHERAL VASCULAR CATHETERIZATION N/A 12/24/2015   Procedure: Abdominal Aortogram;  Surgeon: Richrd Char, MD;  Location: [facility] INVASIVE CV LAB;  Service: Cardiovascular;  Laterality: N/A;   PERIPHERAL VASCULAR CATHETERIZATION Left 12/24/2015   Procedure: Peripheral Vascular Balloon Angioplasty;  Surgeon: Richrd Char, MD;  Location[facility] INVASIVE CV LAB;  Service: Cardiovascular;  Laterality: Left;  drug coated balloon   PERIPHERAL VASCULAR CATHETERIZATION N/A 06/30/2016   Procedure: Abdominal Aortogram;  Surgeon: Richrd Char, MD;  Location: [facility] INVASIVE CV LAB;  Service: Cardiovascular;  Laterality: N/A;   PERIPHERAL VASCULAR  CATHETERIZATION Bilateral 06/30/2016   Procedure: Lower Extremity Angiography;  Surgeon: Richrd Char, MD;  Location: [facility] INVASIVE CV LAB;  Service: Cardiovascular;  Laterality: Bilateral;   TONSILLECTOMY     TRANSESOPHAGEAL ECHOCARDIOGRAM (CATH LAB) N/A 12/13/2023   Procedure: TRANSESOPHAGEAL ECHOCARDIOGRAM;  Surgeon: Maudine Sos, MD;  Location: [facility] INVASIVE CV LAB;  Service: Cardiovascular;  Laterality: N/A;   VEIN HARVEST Left 12/09/2023   Procedure: BACILIC VEIN HARVEST LEFT ARM;  Surgeon: Young Hensen, MD;  Location: [facility] OR;  Service: Vascular;  Laterality: Left;   VEIN REPAIR Right 12/09/2023   Procedure: VEIN  PATCH RIGHT AXILLIA ARTERY RIGHT;  Surgeon: Young Hensen, MD;  Location: [facility] OR;  Service: Vascular;  Laterality: Right;    Allergies  Allergen Reactions   Wound Dressing Adhesive Rash and Other (See Comments)    Pt states that tape and electrodes leave red "scars" on her skin and her skin is very sensitive.  Paper tape ok to use    ROS    Physical Exam: There were  no vitals filed for this visit.  General: The patient is alert and oriented x3 in no acute distress.  Dermatology: Brawny skin changes noted to the lower extremities right greater than left with skin shiny and atrophic.  Dorsal scab stable appearing right fourth toe proximal to the toenail.  No signs of acute bacterial infection to the right hallux toenail.  Vascular: Nonpalpable right foot DP and PT pulses, left DP and PT faintly palpable.  Delayed cap refill to the digits approximately 5 seconds.  Neurological: Reports subjective neuropathy symptoms  Musculoskeletal Exam: Mild bunion deformities and hammertoes of lesser digits.   Assessment/Plan of Care: 1. Type 2 diabetes mellitus with vascular disease (HCC)   2. Ischemic rest pain of lower extremity      No orders of the defined types were placed in this encounter.  AMB REFERRAL TO VASCULAR SURGERY/CLINIC  Discussed clinical  findings with patient today.  Concern with worsening rest pain and claudication symptoms.  Placing urgent referral to vascular surgery.  She has an established relationship with Dr. Claudetta Cuba who she has seen fairly recently, end of March.  Advised patient to go to the emergency room if symptoms worsen or she begins to notice signs of tissue loss or infection.  She is otherwise due for diabetic footcare in about 1 month .  Jill Higgins L. Lunda Salines, AACFAS [facility]     [address] [place], [place] [address]                Office [phone]  Fax [phone]

## 2024-03-21 ENCOUNTER — Other Ambulatory Visit: Payer: Self-pay | Admitting: *Deleted

## 2024-03-21 MED ORDER — APIXABAN 5 MG PO TABS: 5.0000 mg | ORAL_TABLET | Freq: Two times a day (BID) | ORAL | 11 refills | Status: DC

## 2024-03-22 ENCOUNTER — Encounter: Payer: Self-pay | Admitting: Podiatry

## 2024-03-28 NOTE — Telephone Encounter (Signed)
 Pharmacy Patient Advocate Encounter  Received notification from [employer] that Prior Authorization for [facility] 3 plus has been APPROVED through 11/19/2024   PA #/Case ID/Reference #: [id number]

## 2024-04-03 DIAGNOSIS — I10 Essential (primary) hypertension: Secondary | ICD-10-CM | POA: Diagnosis not present

## 2024-04-03 DIAGNOSIS — R5383 Other fatigue: Secondary | ICD-10-CM | POA: Diagnosis not present

## 2024-04-03 DIAGNOSIS — R634 Abnormal weight loss: Secondary | ICD-10-CM | POA: Diagnosis not present

## 2024-04-03 DIAGNOSIS — I739 Peripheral vascular disease, unspecified: Secondary | ICD-10-CM | POA: Diagnosis not present

## 2024-04-03 DIAGNOSIS — R531 Weakness: Secondary | ICD-10-CM | POA: Diagnosis not present

## 2024-04-17 ENCOUNTER — Ambulatory Visit (INDEPENDENT_AMBULATORY_CARE_PROVIDER_SITE_OTHER): Admitting: Podiatry

## 2024-04-17 DIAGNOSIS — Z91199 Patient's noncompliance with other medical treatment and regimen due to unspecified reason: Secondary | ICD-10-CM

## 2024-04-19 NOTE — Progress Notes (Signed)
Patient did not show for scheduled appointment today.

## 2024-04-22 ENCOUNTER — Encounter: Payer: Self-pay | Admitting: Vascular Surgery

## 2024-04-22 ENCOUNTER — Telehealth: Payer: Self-pay

## 2024-04-22 ENCOUNTER — Ambulatory Visit: Payer: Medicare Other | Attending: Vascular Surgery | Admitting: Vascular Surgery

## 2024-04-22 ENCOUNTER — Ambulatory Visit (HOSPITAL_COMMUNITY)
Admission: RE | Admit: 2024-04-22 | Discharge: 2024-04-22 | Disposition: A | Discharge status: Home / Self Care | Payer: Medicare Other | Source: Ambulatory Visit | Attending: Vascular Surgery | Admitting: Vascular Surgery

## 2024-04-22 VITALS — BP 138/81 | HR 64 | Temp 98.0°F | Ht 63.0 in | Wt 101.6 lb

## 2024-04-22 DIAGNOSIS — Z95828 Presence of other vascular implants and grafts: Secondary | ICD-10-CM

## 2024-04-22 DIAGNOSIS — I739 Peripheral vascular disease, unspecified: Secondary | ICD-10-CM

## 2024-04-22 LAB — VAS US ABI WITH/WO TBI
Left ABI: 1.13
Right ABI: 0.39

## 2024-04-22 MED ORDER — CILOSTAZOL 100 MG PO TABS: 100.0000 mg | ORAL_TABLET | Freq: Two times a day (BID) | ORAL | 11 refills | Status: AC

## 2024-04-22 NOTE — Telephone Encounter (Signed)
 Made attempt to call pt at 4:25p to schedule 6 month f/u, but was unable to reach pt and voicemail was full. Will make another attempt on 04/23/2024. MP, PAA

## 2024-04-22 NOTE — Progress Notes (Signed)
 [facility] [place]  ASSESSMENT / PLAN: Jill Higgins is a 78 y.o. female with complex personal vascular history with bilateral lower extremity intervention.  She is currently in very short distance claudication.  Recommend:  Abstinence from all tobacco products. Blood glucose control with goal A1c < 7%. Blood pressure control with goal blood pressure < 140/90 mmHg. Lipid reduction therapy with goal LDL-C <100 mg/dL. Aspirin  81mg  PO QD.  Clopidogrel  75mg  PO QD. Atorvastatin  40-80mg  PO QD (or other "high intensity" statin therapy).  I again reviewed her options in detail.  I think repeat open vascular surgery is a bad idea at this time.  The patient is in agreement.  Will refer to pain management.  Will prescribe cilostazol today.  Follow-up with me in 6 months.  CHIEF COMPLAINT: follow up angiogram  HISTORY OF PRESENT ILLNESS: Jill Higgins is a 78 y.o. female with complex past vascular history who returns to clinic after angiogram done for right lower extremity pain.  Preangiogram noninvasive testing suggested possible right external iliac artery stenosis and possible left leg bypass stenosis.  Neither was identified on angiogram.  Her angiogram was complicated by closure device malfunction which was successfully managed with covered stenting.  The patient is doing reasonably well on my evaluation.  She continues to have right hip and knee pain.  She recently sustained a fall and had some x-rays made.  The pain is not typical of claudication.  She describes point tenderness in her hip.  I do not think she walks fast enough to claudicate.  She does not have any rest pain in her foot.  She has no ulcers.  09/25/23: Patient returns to clinic for follow-up.  She was initially doing well after covered stenting of right external iliac artery dissection, but has unfortunately developed short distance claudication.  The patient has no pain at rest.  She has no ulcers about  her feet.  Her noninvasive studies were performed earlier today and reviewed in person.  02/12/2024: patient returns to discuss her CT angiogram findings.  I again reviewed the high perioperative risk of anatomic reconstruction.  I counseled her that extra-anatomic reconstruction would expose her to similar risks especially infection.  Ultimately, she would like to avoid any intervention if at all possible.  Her symptoms still remain very frustrating to her, for which I provided sympathy.  VASCULAR SURGICAL HISTORY:  Left fem-popliteal vein bypass in December of 2014 by Dr. Nolene Baumgarten, a Right femoral to above knee popliteal vein bypass in March of 2017 Redo right femoral endarterectomy in July of 2019 Angiogram, right external iliac artery stenting May 2024 Right ax fem bypass 11/15/2023 Excision of right axillofemoral bypass 12/09/2023 for infection   Past Medical History:  Diagnosis Date   Bronchitis    Bulging disc    in neck   Depression with anxiety    Diabetes mellitus    Type II   Fibromyalgia    pt. denies   GERD (gastroesophageal reflux disease)    Glaucoma    History of bronchitis    Hyperlipidemia    Hypertension    Overactive bladder    Peripheral vascular disease (HCC)    Pneumonia    Sleep apnea    recent test negative for sleep apnea   Tobacco abuse    Urinary frequency     Past Surgical History:  Procedure Laterality Date   ABDOMINAL AORTAGRAM N/A 08/15/2013   Procedure: ABDOMINAL Tommi Fraise;  Surgeon: Richrd Char, MD;  Location: [facility] CATH LAB;  Service: Cardiovascular;  Laterality: N/A;   ABDOMINAL AORTAGRAM N/A 06/19/2014   Procedure: ABDOMINAL Tommi Fraise;  Surgeon: Richrd Char, MD;  Location: [facility] CATH LAB;  Service: Cardiovascular;  Laterality: N/A;   ABDOMINAL AORTOGRAM W/LOWER EXTREMITY N/A 05/10/2018   Procedure: ABDOMINAL AORTOGRAM W/LOWER EXTREMITY;  Surgeon: Richrd Char, MD;  Location: [facility] INVASIVE CV LAB;  Service: Cardiovascular;  Laterality:  N/A;  bilateral   ABDOMINAL AORTOGRAM W/LOWER EXTREMITY N/A 04/20/2023   Procedure: ABDOMINAL AORTOGRAM W/LOWER EXTREMITY;  Surgeon: Carlene Che, MD;  Location: [facility] INVASIVE CV LAB;  Service: Cardiovascular;  Laterality: N/A;   Aortogram w/ PTA  05/14/08,  11-04-10   Bilateral aortogram w/ bilateral  SFA PTA  stenting    APPLICATION OF WOUND VAC Right 12/09/2023   Procedure: APPLICATION OF WOUND VAC;  Surgeon: Young Hensen, MD;  Location: [facility] OR;  Service: Vascular;  Laterality: Right;   AXILLARY-FEMORAL BYPASS GRAFT Right 11/15/2023   Procedure: RIGHT AXILLO-FEMORAL BYPASS GRAFT;  Surgeon: Carlene Che, MD;  Location: [facility] OR;  Service: Vascular;  Laterality: Right;   AXILLARY-FEMORAL BYPASS GRAFT Right 12/09/2023   Procedure: EXCISION AXILLA-BIFEMORAL RIGHT;  Surgeon: Young Hensen, MD;  Location: [facility] OR;  Service: Vascular;  Laterality: Right;   BREAST SURGERY Right    boil removal   BUNIONECTOMY     L foot in the 1980s   COLONOSCOPY     2014   DILATION AND CURETTAGE OF UTERUS     X4   ENDARTERECTOMY FEMORAL Right 06/03/2018   Procedure: RIGHT FEMORAL ENDARTERECTOMY;  Surgeon: Richrd Char, MD;  Location: [facility] OR;  Service: Vascular;  Laterality: Right;   Epidural shots in neck      ESOPHAGOGASTRODUODENOSCOPY (EGD) WITH PROPOFOL  N/A 09/15/2019   Procedure: ESOPHAGOGASTRODUODENOSCOPY (EGD) WITH PROPOFOL ;  Surgeon: Evangeline Hilts, MD;  Location: [facility] ENDOSCOPY;  Service: Endoscopy;  Laterality: N/A;   EUS N/A 09/15/2019   Procedure: UPPER ENDOSCOPIC ULTRASOUND (EUS) LINEAR;  Surgeon: Evangeline Hilts, MD;  Location: [facility] ENDOSCOPY;  Service: Endoscopy;  Laterality: N/A;   EYE SURGERY Bilateral 2016   cataract removal   FEMORAL ARTERY EXPLORATION Right 12/09/2023   Procedure: Vein Patch Right FEMORAL ARTERY;  Surgeon: Young Hensen, MD;  Location: [facility] OR;  Service: Vascular;  Laterality: Right;   FEMORAL-POPLITEAL BYPASS GRAFT Left 10/21/2013   Procedure: LEFT  FEMORAL-POPLITEAL ARTERY BYPASS WITH SAPHENOUS VEIN GRAFT , POPLITEAL ENDARTERECTOMY ,INTRAOPERATIVE ARTERIOGRAM, vein patch angioplasty to popliteal artery;  Surgeon: Richrd Char, MD;  Location: [facility] OR;  Service: Vascular;  Laterality: Left;   FEMORAL-POPLITEAL BYPASS GRAFT Right 02/08/2016   Procedure: Right FEMORAL- to Above Knee POPLITEAL ARTERY Bypass Graft with reversed saphenous vein and Common Femoral Endarterectomy  with profundoplasty;  Surgeon: Richrd Char, MD;  Location: [facility] OR;  Service: Vascular;  Laterality: Right;   FINE NEEDLE ASPIRATION N/A 09/15/2019   Procedure: FINE NEEDLE ASPIRATION (FNA) LINEAR;  Surgeon: Evangeline Hilts, MD;  Location: [facility] ENDOSCOPY;  Service: Endoscopy;  Laterality: N/A;   GROIN DEBRIDEMENT Left 10/28/2013   Procedure: left inner thigh DEBRIDEMENT;  Surgeon: Richrd Char, MD;  Location: [facility] OR;  Service: Vascular;  Laterality: Left;   HAND SURGERY Left 2010   IR FLUORO GUIDE CV LINE RIGHT  12/14/2023   IR REMOVAL TUN CV CATH W/O FL  01/04/2024   IR US  GUIDE VASC ACCESS RIGHT  12/14/2023   LOWER EXTREMITY ANGIOGRAM Bilateral 12/24/2015   Procedure: Lower Extremity Angiogram;  Surgeon: Ethelle Herb  Robert Chimes, MD;  Location: [facility] INVASIVE CV LAB;  Service: Cardiovascular;  Laterality: Bilateral;   PATCH ANGIOPLASTY Right 06/03/2018   Procedure: PATCH ANGIOPLASTY USING A XENOSURE 1CM X 14CM BIOLOGIC PATCH;  Surgeon: Richrd Char, MD;  Location: [facility] OR;  Service: Vascular;  Laterality: Right;   PERIPHERAL VASCULAR CATHETERIZATION N/A 12/24/2015   Procedure: Abdominal Aortogram;  Surgeon: Richrd Char, MD;  Location: [facility] INVASIVE CV LAB;  Service: Cardiovascular;  Laterality: N/A;   PERIPHERAL VASCULAR CATHETERIZATION Left 12/24/2015   Procedure: Peripheral Vascular Balloon Angioplasty;  Surgeon: Richrd Char, MD;  Location: [facility] INVASIVE CV LAB;  Service: Cardiovascular;  Laterality: Left;  drug coated balloon   PERIPHERAL VASCULAR CATHETERIZATION N/A 06/30/2016    Procedure: Abdominal Aortogram;  Surgeon: Richrd Char, MD;  Location: [facility] INVASIVE CV LAB;  Service: Cardiovascular;  Laterality: N/A;   PERIPHERAL VASCULAR CATHETERIZATION Bilateral 06/30/2016   Procedure: Lower Extremity Angiography;  Surgeon: Richrd Char, MD;  Location: [facility] INVASIVE CV LAB;  Service: Cardiovascular;  Laterality: Bilateral;   TONSILLECTOMY     TRANSESOPHAGEAL ECHOCARDIOGRAM (CATH LAB) N/A 12/13/2023   Procedure: TRANSESOPHAGEAL ECHOCARDIOGRAM;  Surgeon: Maudine Sos, MD;  Location: [facility] INVASIVE CV LAB;  Service: Cardiovascular;  Laterality: N/A;   VEIN HARVEST Left 12/09/2023   Procedure: BACILIC VEIN HARVEST LEFT ARM;  Surgeon: Young Hensen, MD;  Location: [facility] OR;  Service: Vascular;  Laterality: Left;   VEIN REPAIR Right 12/09/2023   Procedure: VEIN  PATCH RIGHT AXILLIA ARTERY RIGHT;  Surgeon: Young Hensen, MD;  Location: [facility] OR;  Service: Vascular;  Laterality: Right;    Family History  Problem Relation Age of Onset   Heart disease Father    Heart attack Father        MI at age 28   Hypertension Mother    Alzheimer's disease Mother    Hypertension Sister    Diabetes Brother    Hyperlipidemia Brother    Hypertension Brother    Heart disease Brother    Heart attack Brother    Breast cancer Maternal Aunt     Social History   Socioeconomic History   Marital status: Single    Spouse name: Not on file   Number of children: 1   Years of education: Not on file   Highest education level: Not on file  Occupational History   Occupation: Retired    Associate Professor: Scientist, water quality  Tobacco Use   Smoking status: Former    Current packs/day: 0.25    Average packs/day: 0.3 packs/day for 0.8 years (0.2 ttl pk-yrs)    Types: Cigarettes    Start date: 06/2023    Quit date: 10/2014    Passive exposure: Never   Smokeless tobacco: Never   Tobacco comments:    pt states that she is using the E cig only   Vaping Use   Vaping status: Some Days  Substance and  Sexual Activity   Alcohol use: Yes    Alcohol/week: 0.0 - 1.0 standard drinks of alcohol    Comment: Occasional; 10-22 rarely    Drug use: No   Sexual activity: Not on file  Other Topics Concern   Not on file  Social History Narrative   Drinks about 2 cups of coffee a day, and some tea    Social Drivers of Corporate investment banker Strain: Not on file  Food Insecurity: No Food Insecurity (12/11/2023)   Hunger Vital Sign    Worried About Running Out of Food  in the Last Year: Never true    Ran Out of Food in the Last Year: Never true  Transportation Needs: No Transportation Needs (12/11/2023)   PRAPARE - Administrator, Civil Service (Medical): No    Lack of Transportation (Non-Medical): No  Physical Activity: Not on file  Stress: Not on file  Social Connections: Moderately Isolated (12/11/2023)   Social Connection and Isolation Panel [NHANES]    Frequency of Communication with Friends and Family: More than three times a week    Frequency of Social Gatherings with Friends and Family: More than three times a week    Attends Religious Services: 1 to 4 times per year    Active Member of [employer] or Organizations: No    Attends Banker Meetings: Never    Marital Status: Divorced  Catering manager Violence: Not At Risk (12/11/2023)   Humiliation, Afraid, Rape, and Kick questionnaire    Fear of Current or Ex-Partner: No    Emotionally Abused: No    Physically Abused: No    Sexually Abused: No    Allergies  Allergen Reactions   Wound Dressing Adhesive Rash and Other (See Comments)    Pt states that tape and electrodes leave red "scars" on her skin and her skin is very sensitive.  Paper tape ok to use    Current Outpatient Medications  Medication Sig Dispense Refill   cilostazol (PLETAL) 100 MG tablet Take 1 tablet (100 mg total) by mouth 2 (two) times daily before a meal. 60 tablet 11   amLODipine  (NORVASC ) 5 MG tablet Take 5 mg by mouth daily.     apixaban   (ELIQUIS ) 5 MG TABS tablet Take 1 tablet (5 mg total) by mouth 2 (two) times daily. 60 tablet 11   APIXABAN  (ELIQUIS ) VTE STARTER PACK (10MG  AND 5MG ) Take as directed on package: start with two-5mg  tablets twice daily for 7 days. On day 8, switch to one-5mg  tablet twice daily. 74 each 0   aspirin  EC 81 MG tablet Take 81 mg by mouth at bedtime.     atorvastatin  (LIPITOR) 80 MG tablet Take 1 tablet (80 mg total) by mouth daily. (Patient taking differently: Take 80 mg by mouth at bedtime.) 30 tablet 11   Blood Glucose Monitoring Suppl (ONETOUCH VERIO FLEX SYSTEM) w/Device KIT USE TO CHECK BLOOD SUGAR 3-4 TIMES DAY 1 kit 0   Continuous Glucose Receiver (FREESTYLE LIBRE 3 READER) DEVI Use to monitor glucose continuously. 1 each 0   Continuous Glucose Sensor (FREESTYLE LIBRE 3 PLUS SENSOR) MISC Inject 1 Device into the skin continuous. Change every 15 days 6 each 3   diclofenac  Sodium (VOLTAREN ) 1 % GEL Apply 1 application. topically daily as needed (leg pain).     DULoxetine  (CYMBALTA ) 60 MG capsule Take 60 mg by mouth daily.     ferrous sulfate  325 (65 FE) MG EC tablet Take 325 mg by mouth daily.     gabapentin  (NEURONTIN ) 300 MG capsule Take 1 capsule (300 mg total) by mouth 3 (three) times daily. 90 capsule 0   glucose blood (ONETOUCH VERIO) test strip Use to check blood sugar 3-4 times a day 400 each 5   insulin  aspart (NOVOLOG  FLEXPEN) 100 UNIT/ML FlexPen 4-8 units with every meal (Patient taking differently: 2-4 units) 15 mL 11   insulin  glargine (LANTUS  SOLOSTAR) 100 UNIT/ML Solostar Pen Inject 5 Units into the skin at bedtime. (Patient taking differently: Inject 10 Units into the skin at bedtime. 10 units daily)  Insulin  Pen Needle 32G X 4 MM MISC Use 4x a day 300 each 3   latanoprost  (XALATAN ) 0.005 % ophthalmic solution Place 1 drop into both eyes at bedtime.     lipase/protease/amylase 24000-76000 units CPEP Take 1 capsule (24,000 Units total) by mouth 3 (three) times daily before meals.  (Patient taking differently: Take 24,000-48,000 Units by mouth See admin instructions. Take 2 capsules before meals and 1 capsule after meals.) 270 capsule 0   MAGNESIUM  PO Take 1 tablet by mouth daily.     metFORMIN  (GLUCOPHAGE -XR) 500 MG 24 hr tablet Take 1 tablet (500 mg total) by mouth 2 (two) times daily with a meal. 180 tablet 2   Multiple Vitamins-Minerals (CENTRUM SILVER  50+WOMEN) TABS Take 1 tablet by mouth daily.     mupirocin  ointment (BACTROBAN ) 2 % Apply 1 Application topically 2 (two) times daily. Right 1st toe medial border 22 g 0   OneTouch Delica Lancets 33G MISC Check blood sugar 3-4 times a day 400 each 5   oxybutynin  (DITROPAN -XL) 10 MG 24 hr tablet Take 10 mg by mouth daily.     oxyCODONE  (OXY IR/ROXICODONE ) 5 MG immediate release tablet Take 1-2 tablets (5-10 mg total) by mouth every 4 (four) hours as needed for moderate pain (pain score 4-6). 30 tablet 0   pantoprazole  (PROTONIX ) 40 MG tablet Take 40 mg by mouth every evening.     polyethylene glycol (MIRALAX  / GLYCOLAX ) 17 g packet Take 17 g by mouth daily as needed for mild constipation.     senna-docusate (SENOKOT-S) 8.6-50 MG tablet Take 1 tablet by mouth daily.     temazepam  (RESTORIL ) 30 MG capsule Take 1 capsule (30 mg total) by mouth at bedtime as needed for sleep. 30 capsule 0   timolol  (TIMOPTIC ) 0.5 % ophthalmic solution Place 1 drop into both eyes every morning.     traMADol  (ULTRAM ) 50 MG tablet Take 50 mg by mouth every 6 (six) hours as needed.     traZODone  (DESYREL ) 50 MG tablet Take 75 mg by mouth at bedtime as needed for sleep.     No current facility-administered medications for this visit.    PHYSICAL EXAM Vitals:   04/22/24 1537  BP: 138/81  Pulse: 64  Temp: 98 F (36.7 C)  TempSrc: Temporal  SpO2: 96%  Weight: 101 lb 9.6 oz (46.1 kg)  Height: 5\' 3"  (1.6 m)     Thin elderly woman in no acute distress Regular rate and rhythm Unlabored breathing No palpable pedal pulses   PERTINENT  LABORATORY AND RADIOLOGIC DATA  Most recent CBC    Latest Ref Rng & Units 12/19/2023    4:15 AM 12/18/2023    5:00 AM 12/17/2023    5:10 AM  CBC  WBC 4.0 - 10.5 K/uL 8.9  7.6  6.6   Hemoglobin 12.0 - 15.0 g/dL 9.8  9.0  8.7   Hematocrit 36.0 - 46.0 % 30.0  27.6  26.6   Platelets 150 - 400 K/uL 501  410  368      Most recent CMP    Latest Ref Rng & Units 12/19/2023    4:15 AM 12/18/2023    5:00 AM 12/17/2023    5:10 AM  CMP  Glucose 70 - 99 mg/dL 161  096  045   BUN 8 - 23 mg/dL 9  8  7    Creatinine 0.44 - 1.00 mg/dL 4.09  8.11  9.14   Sodium 135 - 145 mmol/L 136  136  133   Potassium 3.5 - 5.1 mmol/L 4.4  4.0  3.6   Chloride 98 - 111 mmol/L 103  103  104   CO2 22 - 32 mmol/L 24  25  23    Calcium  8.9 - 10.3 mg/dL 8.3  8.0  7.6   Total Protein 6.5 - 8.1 g/dL 5.4  4.9  4.8   Total Bilirubin 0.0 - 1.2 mg/dL 0.4  0.4  0.4   Alkaline Phos 38 - 126 U/L 86  74  74   AST 15 - 41 U/L 27  34  21   ALT 0 - 44 U/L 19  19  17      Renal function CrCl cannot be calculated (Patient's most recent lab result is older than the maximum 21 days allowed.).  Hemoglobin A1C  Date Value  01/24/2024 6.1 % (A)  05/13/2020 8.5    LDL Chol Calc (NIH)  Date Value Ref Range Status  02/08/2022 21 0 - 99 mg/dL Final   LDL Cholesterol  Date Value Ref Range Status  12/10/2023 15 0 - 99 mg/dL Final    Comment:           Total Cholesterol/HDL:CHD Risk Coronary Heart Disease Risk Table                     Men   Women  1/2 Average Risk   3.4   3.3  Average Risk       5.0   4.4  2 X Average Risk   9.6   7.1  3 X Average Risk  23.4   11.0        Use the calculated Patient Ratio above and the CHD Risk Table to determine the patient's CHD Risk.        ATP III CLASSIFICATION (LDL):  <100     mg/dL   Optimal  161-096  mg/dL   Near or Above                    Optimal  130-159  mg/dL   Borderline  045-409  mg/dL   High  >811     mg/dL   Very High Performed at [facility], [address], [place], [place] [address]      +-------+-----------+-----------+------------+------------+  ABI/TBIToday's ABIToday's TBIPrevious ABIPrevious TBI  +-------+-----------+-----------+------------+------------+  Right 0.39       0.28       0.47        near absent   +-------+-----------+-----------+------------+------------+  Left  1.13       0.58       1.05        0.79          +-------+-----------+-----------+------------+------------+     Heber Little. Edgardo Goodwill, MD [facility] Phone Number: [phone] 04/22/2024 5:22 PM   Total time spent on preparing this encounter including chart review, data review, collecting history, examining the patient, coordinating care for this established patient, 30 minutes.  Portions of this report may have been transcribed using voice recognition software.  Every effort has been made to ensure accuracy; however, inadvertent computerized transcription errors may still be present.

## 2024-04-23 ENCOUNTER — Encounter: Payer: Self-pay | Admitting: Podiatry

## 2024-04-23 ENCOUNTER — Ambulatory Visit (INDEPENDENT_AMBULATORY_CARE_PROVIDER_SITE_OTHER): Admitting: Podiatry

## 2024-04-23 DIAGNOSIS — E1159 Type 2 diabetes mellitus with other circulatory complications: Secondary | ICD-10-CM

## 2024-04-23 DIAGNOSIS — M79674 Pain in right toe(s): Secondary | ICD-10-CM

## 2024-04-23 DIAGNOSIS — B351 Tinea unguium: Secondary | ICD-10-CM

## 2024-04-23 DIAGNOSIS — M79675 Pain in left toe(s): Secondary | ICD-10-CM | POA: Diagnosis not present

## 2024-04-23 NOTE — Progress Notes (Signed)
This patient returns to my office for at risk foot care.  This patient requires this care by a professional since this patient will be at risk due to having diabetes with vascular disease. This patient is unable to cut nails herself since the patient cannot reach her nails.These nails are painful walking and wearing shoes.  This patient presents for at risk foot care today.  General Appearance  Alert, conversant and in no acute stress.  Vascular  Dorsalis pedis and posterior tibial  pulses are  weakly palpable  bilaterally.  Capillary return is within normal limits  bilaterally. Temperature is within normal limits  bilaterally.  Neurologic  Senn-Weinstein monofilament wire test within normal limits  bilaterally. Muscle power within normal limits bilaterally.  Nails Thick disfigured discolored nails with subungual debris  from hallux to fifth toes bilaterally. No evidence of bacterial infection or drainage bilaterally.  Orthopedic  No limitations of motion  feet .  No crepitus or effusions noted.  No bony pathology or digital deformities noted.  Skin  normotropic skin with no porokeratosis noted bilaterally.  No signs of infections or ulcers noted.     Onychomycosis  Pain in right toes  Pain in left toes  Consent was obtained for treatment procedures.   Mechanical debridement of nails 1-5  bilaterally performed with a nail nipper.  Filed with dremel without incident.    Return office visit   3 months                   Told patient to return for periodic foot care and evaluation due to potential at risk complications.   Helane Gunther DPM

## 2024-04-24 ENCOUNTER — Other Ambulatory Visit: Payer: Self-pay | Admitting: *Deleted

## 2024-04-24 DIAGNOSIS — I739 Peripheral vascular disease, unspecified: Secondary | ICD-10-CM

## 2024-04-24 DIAGNOSIS — Z95828 Presence of other vascular implants and grafts: Secondary | ICD-10-CM

## 2024-05-01 ENCOUNTER — Encounter: Payer: Self-pay | Admitting: Physical Medicine and Rehabilitation

## 2024-05-05 ENCOUNTER — Ambulatory Visit (INDEPENDENT_AMBULATORY_CARE_PROVIDER_SITE_OTHER): Payer: Medicare Other | Admitting: Internal Medicine

## 2024-05-05 ENCOUNTER — Encounter: Payer: Self-pay | Admitting: Internal Medicine

## 2024-05-05 VITALS — BP 122/72 | HR 87 | Ht 63.0 in | Wt 99.0 lb

## 2024-05-05 DIAGNOSIS — Z7984 Long term (current) use of oral hypoglycemic drugs: Secondary | ICD-10-CM | POA: Diagnosis not present

## 2024-05-05 DIAGNOSIS — E7800 Pure hypercholesterolemia, unspecified: Secondary | ICD-10-CM

## 2024-05-05 DIAGNOSIS — E1151 Type 2 diabetes mellitus with diabetic peripheral angiopathy without gangrene: Secondary | ICD-10-CM

## 2024-05-05 LAB — POCT GLYCOSYLATED HEMOGLOBIN (HGB A1C): Hemoglobin A1C: 7.4 % — AB (ref 4.0–5.6)

## 2024-05-05 NOTE — Progress Notes (Signed)
 Patient ID: Jill Higgins, female   DOB: June 10, 1946, 78 y.o.   MRN: [id number]  HPI: Jill Higgins is a 78 y.o.-year-old female, returning for follow-up for DM2, dx in 2013, insulin -dependent, uncontrolled, with complications (CAD, PAD). Pt. previously saw Dr. Washington Hacker, but last visit with me 3 mo ago.  Interim history: No increased urination,  blurry vision, nausea. She still has pain in her R leg after her vascular stent >> just started Cilostazol . She will also see pain clinic in 06/2024.   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  Reviewed HbA1c: Lab Results  Component Value Date   HGBA1C 6.1 (A) 01/24/2024   HGBA1C 9.5 (A) 09/27/2023   HGBA1C 7.3 (A) 05/10/2023   HGBA1C 5.6 12/18/2022   HGBA1C 7.0 (A) 08/17/2022   HGBA1C 7.0 (A) 04/20/2022   HGBA1C 6.3 (A) 01/23/2022   HGBA1C 6.8 (A) 10/24/2021   HGBA1C 7.5 (A) 07/18/2021   HGBA1C 7.9 (A) 02/01/2021  06/26/2022: HbA1c 8.5%   She was previously on: - Metformin  ER 1000 >> 500 mg 2x a day, with meals  - NovoLog  4-8 units 2x a day >> Novolog  2-6 units 2x a day 15 min before the meal If the sugars are high between meals, you can correct with NovoLog : >200: + 1 unit >300: + 2 units >400 + 3 units She had previous hypoglycemia with glipizide. She was on repaglinide  until we started NovoLog .  Currently on:  - Metformin  ER 500 mg 2x a day, with meals - Lantus  8-10 units at night  - added 09/2023 >> 20 >> 16 >> 12 >> 10  units daily >> STOPPED 01/2024 - FiAsp  >> Novolog  4-8 >> 6-10 units 2-3x a day before the meal >> 2 to 4 units depending on the meal size >> 2-3 units before large meals only  -stopped due to low blood sugars  Pt checks her sugars >4x a day with the Libre 2 CGM - PDM:  Prev.:   Lowest blood sugars: 49 >> ...70 >> 27!!! >> 55; she has  hypoglycemia awareness at 70.  In 12/2023, her sugars dropped to 27 (she lost consciousness) per EMS.  Afterwards, her doses of insulin  were reduced.  Highest sugar was 300s >> 200 >> high 300s >> 306.  Glucometer: Accu-Chek guide  Pt's meals - 2x a day.  - no CKD, BUN/creatinine: 04/03/2024: 8/0.99, GFR 60, glucose 146 Lab Results  Component Value Date   BUN 9 12/19/2023   BUN 8 12/18/2023   CREATININE 0.99 12/19/2023   CREATININE 0.76 12/18/2023   Lab Results  Component Value Date   MICRALBCREAT 1.2 09/27/2023  She is not on ACE inhibitor/ARB.  -+ HL; last set of lipids: 02/04/2024: 105/87/60/27 Lab Results  Component Value Date   CHOL 48 12/10/2023   HDL 20 (L) 12/10/2023   LDLCALC 15 12/10/2023   TRIG 65 12/10/2023   CHOLHDL 2.4 12/10/2023  On Mevacor 10 mg daily  - last eye exam was in 2024. No DR reportedly. + Glaucoma. Dr. Brooks Cao.  - + numbness and tingling in her feet.  Last foot exam 04/23/2024 (Dr. Zettie Hillock).  On Gabapentin .   She has a history of HTN, overactive bladder, depression/anxiety, anemia-on iron 325 mg daily. She had Whipple procedure (for what turned out to be a benign tumor) in 10/2019 and then after hospitalization  for pancreatitis 06/2022.  In 2024, she had axillo-femoral bypass graft  - she had a lot of pain.  She was also admitted with bacteremia after surgery.  She was in rehab afterwards.  On Pletal .  ROS: + see HPI  Past Medical History:  Diagnosis Date   Bronchitis    Bulging disc    in neck   Depression with anxiety    Diabetes mellitus    Type II   Fibromyalgia    pt. denies   GERD (gastroesophageal reflux disease)    Glaucoma    History of bronchitis    Hyperlipidemia    Hypertension    Overactive bladder    Peripheral vascular disease (HCC)    Pneumonia    Sleep apnea    recent test negative for sleep apnea   Tobacco abuse    Urinary frequency    Past Surgical History:  Procedure Laterality Date   ABDOMINAL AORTAGRAM N/A  08/15/2013   Procedure: ABDOMINAL Tommi Fraise;  Surgeon: Richrd Char, MD;  Location: [facility] CATH LAB;  Service: Cardiovascular;  Laterality: N/A;   ABDOMINAL AORTAGRAM N/A 06/19/2014   Procedure: ABDOMINAL Tommi Fraise;  Surgeon: Richrd Char, MD;  Location: [facility] CATH LAB;  Service: Cardiovascular;  Laterality: N/A;   ABDOMINAL AORTOGRAM W/LOWER EXTREMITY N/A 05/10/2018   Procedure: ABDOMINAL AORTOGRAM W/LOWER EXTREMITY;  Surgeon: Richrd Char, MD;  Location: [facility] INVASIVE CV LAB;  Service: Cardiovascular;  Laterality: N/A;  bilateral   ABDOMINAL AORTOGRAM W/LOWER EXTREMITY N/A 04/20/2023   Procedure: ABDOMINAL AORTOGRAM W/LOWER EXTREMITY;  Surgeon: Carlene Che, MD;  Location: [facility] INVASIVE CV LAB;  Service: Cardiovascular;  Laterality: N/A;   Aortogram w/ PTA  05/14/08,  11-04-10   Bilateral aortogram w/ bilateral  SFA PTA  stenting    APPLICATION OF WOUND VAC Right 12/09/2023   Procedure: APPLICATION OF WOUND VAC;  Surgeon: Young Hensen, MD;  Location: [facility] OR;  Service: Vascular;  Laterality: Right;   AXILLARY-FEMORAL BYPASS GRAFT Right 11/15/2023   Procedure: RIGHT AXILLO-FEMORAL BYPASS GRAFT;  Surgeon: Carlene Che, MD;  Location[facility] OR;  Service: Vascular;  Laterality: Right;   AXILLARY-FEMORAL BYPASS GRAFT Right 12/09/2023   Procedure: EXCISION AXILLA-BIFEMORAL RIGHT;  Surgeon: Young Hensen, MD;  Location: [facility] OR;  Service: Vascular;  Laterality: Right;   BREAST SURGERY Right    boil removal   BUNIONECTOMY     L foot in the 1980s   COLONOSCOPY     2014   DILATION AND CURETTAGE OF UTERUS     X4   ENDARTERECTOMY FEMORAL Right 06/03/2018   Procedure: RIGHT FEMORAL ENDARTERECTOMY;  Surgeon: Richrd Char, MD;  Location: [facility] OR;  Service: Vascular;  Laterality: Right;   Epidural shots in neck      ESOPHAGOGASTRODUODENOSCOPY (EGD) WITH PROPOFOL  N/A 09/15/2019   Procedure: ESOPHAGOGASTRODUODENOSCOPY (EGD) WITH PROPOFOL ;  Surgeon: Evangeline Hilts, MD;  Location: [facility]  ENDOSCOPY;  Service: Endoscopy;  Laterality: N/A;   EUS N/A 09/15/2019   Procedure: UPPER ENDOSCOPIC ULTRASOUND (EUS) LINEAR;  Surgeon: Evangeline Hilts, MD;  Location: [facility] ENDOSCOPY;  Service: Endoscopy;  Laterality: N/A;   EYE SURGERY Bilateral 2016   cataract removal   FEMORAL ARTERY EXPLORATION Right 12/09/2023   Procedure: Vein Patch Right FEMORAL ARTERY;  Surgeon: Young Hensen, MD;  Location: [facility] OR;  Service: Vascular;  Laterality: Right;   FEMORAL-POPLITEAL BYPASS GRAFT Left 10/21/2013   Procedure: LEFT FEMORAL-POPLITEAL ARTERY BYPASS WITH SAPHENOUS VEIN GRAFT , POPLITEAL ENDARTERECTOMY ,INTRAOPERATIVE ARTERIOGRAM, vein patch angioplasty to  popliteal artery;  Surgeon: Richrd Char, MD;  Location: [facility] OR;  Service: Vascular;  Laterality: Left;   FEMORAL-POPLITEAL BYPASS GRAFT Right 02/08/2016   Procedure: Right FEMORAL- to Above Knee POPLITEAL ARTERY Bypass Graft with reversed saphenous vein and Common Femoral Endarterectomy  with profundoplasty;  Surgeon: Richrd Char, MD;  Location: [facility] OR;  Service: Vascular;  Laterality: Right;   FINE NEEDLE ASPIRATION N/A 09/15/2019   Procedure: FINE NEEDLE ASPIRATION (FNA) LINEAR;  Surgeon: Evangeline Hilts, MD;  Location: [facility] ENDOSCOPY;  Service: Endoscopy;  Laterality: N/A;   GROIN DEBRIDEMENT Left 10/28/2013   Procedure: left inner thigh DEBRIDEMENT;  Surgeon: Richrd Char, MD;  Location: [facility] OR;  Service: Vascular;  Laterality: Left;   HAND SURGERY Left 2010   IR FLUORO GUIDE CV LINE RIGHT  12/14/2023   IR REMOVAL TUN CV CATH W/O FL  01/04/2024   IR US  GUIDE VASC ACCESS RIGHT  12/14/2023   LOWER EXTREMITY ANGIOGRAM Bilateral 12/24/2015   Procedure: Lower Extremity Angiogram;  Surgeon: Richrd Char, MD;  Location: [facility] INVASIVE CV LAB;  Service: Cardiovascular;  Laterality: Bilateral;   PATCH ANGIOPLASTY Right 06/03/2018   Procedure: PATCH ANGIOPLASTY USING A XENOSURE 1CM X 14CM BIOLOGIC PATCH;  Surgeon: Richrd Char, MD;  Location: [facility]  OR;  Service: Vascular;  Laterality: Right;   PERIPHERAL VASCULAR CATHETERIZATION N/A 12/24/2015   Procedure: Abdominal Aortogram;  Surgeon: Richrd Char, MD;  Location: [facility] INVASIVE CV LAB;  Service: Cardiovascular;  Laterality: N/A;   PERIPHERAL VASCULAR CATHETERIZATION Left 12/24/2015   Procedure: Peripheral Vascular Balloon Angioplasty;  Surgeon: Richrd Char, MD;  Location: [facility] INVASIVE CV LAB;  Service: Cardiovascular;  Laterality: Left;  drug coated balloon   PERIPHERAL VASCULAR CATHETERIZATION N/A 06/30/2016   Procedure: Abdominal Aortogram;  Surgeon: Richrd Char, MD;  Location: [facility] INVASIVE CV LAB;  Service: Cardiovascular;  Laterality: N/A;   PERIPHERAL VASCULAR CATHETERIZATION Bilateral 06/30/2016   Procedure: Lower Extremity Angiography;  Surgeon: Richrd Char, MD;  Location: [facility] INVASIVE CV LAB;  Service: Cardiovascular;  Laterality: Bilateral;   TONSILLECTOMY     TRANSESOPHAGEAL ECHOCARDIOGRAM (CATH LAB) N/A 12/13/2023   Procedure: TRANSESOPHAGEAL ECHOCARDIOGRAM;  Surgeon: Maudine Sos, MD;  Location: [facility] INVASIVE CV LAB;  Service: Cardiovascular;  Laterality: N/A;   VEIN HARVEST Left 12/09/2023   Procedure: BACILIC VEIN HARVEST LEFT ARM;  Surgeon: Young Hensen, MD;  Location: [facility] OR;  Service: Vascular;  Laterality: Left;   VEIN REPAIR Right 12/09/2023   Procedure: VEIN  PATCH RIGHT AXILLIA ARTERY RIGHT;  Surgeon: Young Hensen, MD;  Location: [facility] OR;  Service: Vascular;  Laterality: Right;   Social History   Socioeconomic History   Marital status: Single    Spouse name: Not on file   Number of children: 1   Years of education: Not on file   Highest education level: Not on file  Occupational History   Occupation: Retired    Associate Professor: Scientist, water quality  Tobacco Use   Smoking status: Former    Current packs/day: 0.25    Average packs/day: 0.3 packs/day for 0.9 years (0.2 ttl pk-yrs)    Types: Cigarettes    Start date: 06/2023    Quit date: 10/2014    Passive  exposure: Never   Smokeless tobacco: Never   Tobacco comments:    pt states that she is using the E cig only   Vaping Use   Vaping status: Some Days  Substance and Sexual Activity   Alcohol use: Yes  Alcohol/week: 0.0 - 1.0 standard drinks of alcohol    Comment: Occasional; 10-22 rarely    Drug use: No   Sexual activity: Not on file  Other Topics Concern   Not on file  Social History Narrative   Drinks about 2 cups of coffee a day, and some tea    Social Drivers of Corporate investment banker Strain: Not on file  Food Insecurity: No Food Insecurity (12/11/2023)   Hunger Vital Sign    Worried About Running Out of Food in the Last Year: Never true    Ran Out of Food in the Last Year: Never true  Transportation Needs: No Transportation Needs (12/11/2023)   PRAPARE - Administrator, Civil Service (Medical): No    Lack of Transportation (Non-Medical): No  Physical Activity: Not on file  Stress: Not on file  Social Connections: Moderately Isolated (12/11/2023)   Social Connection and Isolation Panel    Frequency of Communication with Friends and Family: More than three times a week    Frequency of Social Gatherings with Friends and Family: More than three times a week    Attends Religious Services: 1 to 4 times per year    Active Member of [employer] or Organizations: No    Attends Banker Meetings: Never    Marital Status: Divorced  Catering manager Violence: Not At Risk (12/11/2023)   Humiliation, Afraid, Rape, and Kick questionnaire    Fear of Current or Ex-Partner: No    Emotionally Abused: No    Physically Abused: No    Sexually Abused: No   Current Outpatient Medications on File Prior to Visit  Medication Sig Dispense Refill   amLODipine  (NORVASC ) 5 MG tablet Take 5 mg by mouth daily.     apixaban  (ELIQUIS ) 5 MG TABS tablet Take 1 tablet (5 mg total) by mouth 2 (two) times daily. 60 tablet 11   APIXABAN  (ELIQUIS ) VTE STARTER PACK (10MG  AND 5MG ) Take  as directed on package: start with two-5mg  tablets twice daily for 7 days. On day 8, switch to one-5mg  tablet twice daily. 74 each 0   aspirin  EC 81 MG tablet Take 81 mg by mouth at bedtime.     atorvastatin  (LIPITOR) 80 MG tablet Take 1 tablet (80 mg total) by mouth daily. (Patient taking differently: Take 80 mg by mouth at bedtime.) 30 tablet 11   Blood Glucose Monitoring Suppl (ONETOUCH VERIO FLEX SYSTEM) w/Device KIT USE TO CHECK BLOOD SUGAR 3-4 TIMES DAY 1 kit 0   cilostazol  (PLETAL ) 100 MG tablet Take 1 tablet (100 mg total) by mouth 2 (two) times daily before a meal. 60 tablet 11   Continuous Glucose Receiver (FREESTYLE LIBRE 3 READER) DEVI Use to monitor glucose continuously. 1 each 0   Continuous Glucose Sensor (FREESTYLE LIBRE 3 PLUS SENSOR) MISC Inject 1 Device into the skin continuous. Change every 15 days 6 each 3   diclofenac  Sodium (VOLTAREN ) 1 % GEL Apply 1 application. topically daily as needed (leg pain).     DULoxetine  (CYMBALTA ) 60 MG capsule Take 60 mg by mouth daily.     ferrous sulfate  325 (65 FE) MG EC tablet Take 325 mg by mouth daily.     gabapentin  (NEURONTIN ) 300 MG capsule Take 1 capsule (300 mg total) by mouth 3 (three) times daily. 90 capsule 0   glucose blood (ONETOUCH VERIO) test strip Use to check blood sugar 3-4 times a day 400 each 5   insulin  aspart (NOVOLOG  FLEXPEN)  100 UNIT/ML FlexPen 4-8 units with every meal (Patient taking differently: 2-4 units) 15 mL 11   insulin  glargine (LANTUS  SOLOSTAR) 100 UNIT/ML Solostar Pen Inject 5 Units into the skin at bedtime. (Patient taking differently: Inject 10 Units into the skin at bedtime. 10 units daily)     Insulin  Pen Needle 32G X 4 MM MISC Use 4x a day 300 each 3   latanoprost  (XALATAN ) 0.005 % ophthalmic solution Place 1 drop into both eyes at bedtime.     lipase/protease/amylase 24000-76000 units CPEP Take 1 capsule (24,000 Units total) by mouth 3 (three) times daily before meals. (Patient taking differently: Take  24,000-48,000 Units by mouth See admin instructions. Take 2 capsules before meals and 1 capsule after meals.) 270 capsule 0   MAGNESIUM  PO Take 1 tablet by mouth daily.     metFORMIN  (GLUCOPHAGE -XR) 500 MG 24 hr tablet Take 1 tablet (500 mg total) by mouth 2 (two) times daily with a meal. 180 tablet 2   Multiple Vitamins-Minerals (CENTRUM SILVER  50+WOMEN) TABS Take 1 tablet by mouth daily.     mupirocin  ointment (BACTROBAN ) 2 % Apply 1 Application topically 2 (two) times daily. Right 1st toe medial border 22 g 0   OneTouch Delica Lancets 33G MISC Check blood sugar 3-4 times a day 400 each 5   oxybutynin  (DITROPAN -XL) 10 MG 24 hr tablet Take 10 mg by mouth daily.     oxyCODONE  (OXY IR/ROXICODONE ) 5 MG immediate release tablet Take 1-2 tablets (5-10 mg total) by mouth every 4 (four) hours as needed for moderate pain (pain score 4-6). 30 tablet 0   pantoprazole  (PROTONIX ) 40 MG tablet Take 40 mg by mouth every evening.     polyethylene glycol (MIRALAX  / GLYCOLAX ) 17 g packet Take 17 g by mouth daily as needed for mild constipation.     senna-docusate (SENOKOT-S) 8.6-50 MG tablet Take 1 tablet by mouth daily.     temazepam  (RESTORIL ) 30 MG capsule Take 1 capsule (30 mg total) by mouth at bedtime as needed for sleep. 30 capsule 0   timolol  (TIMOPTIC ) 0.5 % ophthalmic solution Place 1 drop into both eyes every morning.     traMADol  (ULTRAM ) 50 MG tablet Take 50 mg by mouth every 6 (six) hours as needed.     traZODone  (DESYREL ) 50 MG tablet Take 75 mg by mouth at bedtime as needed for sleep.     No current facility-administered medications on file prior to visit.   Allergies  Allergen Reactions   Wound Dressing Adhesive Rash and Other (See Comments)    Pt states that tape and electrodes leave red scars on her skin and her skin is very sensitive.  Paper tape ok to use   Family History  Problem Relation Age of Onset   Heart disease Father    Heart attack Father        MI at age 79   Hypertension  Mother    Alzheimer's disease Mother    Hypertension Sister    Diabetes Brother    Hyperlipidemia Brother    Hypertension Brother    Heart disease Brother    Heart attack Brother    Breast cancer Maternal Aunt    PE: BP 122/72   Pulse 87   Ht 5' 3 (1.6 m)   Wt 99 lb (44.9 kg)   SpO2 98%   BMI 17.54 kg/m  Wt Readings from Last 3 Encounters:  05/05/24 99 lb (44.9 kg)  04/22/24 101 lb 9.6 oz (46.1  kg)  02/28/24 105 lb (47.6 kg)   Constitutional: thin, in NAD Eyes:  EOMI, no exophthalmos ENT: no neck masses, no cervical lymphadenopathy Cardiovascular: RRR, No MRG Respiratory: CTA B Musculoskeletal: no deformities Skin:no rashes Neurological: no tremor with outstretched hands  1. DM2, insulin -dependent, controlled, with complications - CAD - PAD - s/p stents  Component     Latest Ref Rng 04/20/2022  Glucose     65 - 99 mg/dL 130 (H)   C-Peptide     0.80 - 3.85 ng/mL 1.50     2.  Hyperlipidemia  PLAN:  1. Patient with longstanding, uncontrolled, type 2 diabetes, on oral antidiabetic regimen with metformin  and previously on basal/bolus insulin  regimen, with a basal insulin  stopped at last visit due to many lows.  She had a blood sugar of 27 during which she lost consciousness.  This value was reported by EMS, not by her sensor.  Her HbA1c in 09/2023 was higher, at 9.5%, but at last visit this was much lower, at 6.1%.  At that time, I advised her to stop Lantus  and only use NovoLog  as needed before larger meals, at lower doses.  She did have some hyperglycemic spikes that were quite acute in shape and I was hoping that this would improve after eliminating her lows.  We continued metformin . CGM interpretation: -At today's visit, we reviewed her CGM downloads: It appears that 70% of values are in target range (goal >70%), while 27% are higher than 180 (goal <25%), and 3% are lower than 70 (goal <4%).  The calculated average blood sugar is 154.  The projected HbA1c for the next 3  months (GMI) is 7.0%. -Reviewing the CGM trends, sugars appear to be more fluctuating than before, with still low blood sugars at different times of the day but significant variability in blood sugars after lunch.  Upon questioning, she is keeping her NovoLog  in the fridge and not taking it with her when she eats out.  I believe that some of her blood sugars are low as she is injecting NovoLog  after the meal.  Also, upon questioning, she is not using the recommended 2 to 3 units of NovoLog  before larger meals, but she is using a sliding scale, only guiding her insulin  doses based on the sugars and not the meals.  I believe that this is the reason why her sugars are so fluctuating and at today's visit I explained again that ideally she would be proactive with her insulin  injections, injecting smaller amount for a smaller meal and a larger amount, up to 4 units before a larger meal.  We also discussed about what constitutes smaller and larger meals, this classification is not based on volume, but the increase in blood sugars after starchy meals.  No need to start Lantus  for now.  Will continue the same dose of metformin . - I suggested to:  Patient Instructions  Please continue: - Metformin  ER 500 mg 2x a day, with meals  Change Novolog : - 2-3 units 15 min before a regular meal - 4 units 15 min before a larger meal  Please return in 3 months.  - we checked her HbA1c: 7.4% (higher) - advised to check sugars at different times of the day - 4x a day, rotating check times - advised for yearly eye exams >> she is UTD - will recheck her ACR today - return to clinic in 3 months  2. HL - Reviewed latest lipid panel from 01/2024: Fractions at goal.  Her  low HDL improved significantly. - Continues lovastatin 40 mg daily without side effects.  Orders Placed This Encounter  Procedures   Microalbumin / creatinine urine ratio   POCT glycosylated hemoglobin (Hb A1C)   Emilie Harden, MD PhD [facility]  Endocrinology

## 2024-05-05 NOTE — Patient Instructions (Addendum)
 Please continue: - Metformin  ER 500 mg 2x a day, with meals  Change Novolog : - 2-3 units 15 min before a regular meal - 4 units 15 min before a larger meal  Please return in 3 months.

## 2024-05-06 ENCOUNTER — Ambulatory Visit: Payer: Self-pay | Admitting: Internal Medicine

## 2024-05-06 LAB — MICROALBUMIN / CREATININE URINE RATIO
Creatinine, Urine: 197 mg/dL (ref 20–275)
Microalb Creat Ratio: 5 mg/g{creat} (ref <30)
Microalb, Ur: 0.9 mg/dL

## 2024-05-12 ENCOUNTER — Telehealth: Payer: Self-pay

## 2024-05-12 NOTE — Telephone Encounter (Signed)
 Pt called [facility] triage line c/o increased rest pain to her right great toe that throbs when she is lying down.  Consulted Dr. Magda - given her extensive medical history and the complexity of her vascular issues, she is not a good candidate for intervention or surgery.    Pt has been referred to pain management.  Pt advised to follow up with pain management and try to get an earlier appt.

## 2024-05-19 DIAGNOSIS — H409 Unspecified glaucoma: Secondary | ICD-10-CM | POA: Diagnosis not present

## 2024-05-19 DIAGNOSIS — E7800 Pure hypercholesterolemia, unspecified: Secondary | ICD-10-CM | POA: Diagnosis not present

## 2024-05-19 DIAGNOSIS — E1165 Type 2 diabetes mellitus with hyperglycemia: Secondary | ICD-10-CM | POA: Diagnosis not present

## 2024-05-19 DIAGNOSIS — E1169 Type 2 diabetes mellitus with other specified complication: Secondary | ICD-10-CM | POA: Diagnosis not present

## 2024-05-24 DIAGNOSIS — R300 Dysuria: Secondary | ICD-10-CM | POA: Diagnosis not present

## 2024-05-28 ENCOUNTER — Emergency Department (HOSPITAL_COMMUNITY)
Admission: EM | Admit: 2024-05-28 | Discharge: 2024-05-28 | Disposition: A | Discharge status: Home / Self Care | Attending: Emergency Medicine | Admitting: Emergency Medicine

## 2024-05-28 ENCOUNTER — Other Ambulatory Visit: Payer: Self-pay

## 2024-05-28 ENCOUNTER — Encounter (HOSPITAL_COMMUNITY): Payer: Self-pay | Admitting: Emergency Medicine

## 2024-05-28 ENCOUNTER — Telehealth: Payer: Self-pay | Admitting: Internal Medicine

## 2024-05-28 ENCOUNTER — Emergency Department (HOSPITAL_COMMUNITY)

## 2024-05-28 DIAGNOSIS — Z7901 Long term (current) use of anticoagulants: Secondary | ICD-10-CM | POA: Diagnosis not present

## 2024-05-28 DIAGNOSIS — Z7982 Long term (current) use of aspirin: Secondary | ICD-10-CM | POA: Insufficient documentation

## 2024-05-28 DIAGNOSIS — Z794 Long term (current) use of insulin: Secondary | ICD-10-CM | POA: Diagnosis not present

## 2024-05-28 DIAGNOSIS — I10 Essential (primary) hypertension: Secondary | ICD-10-CM | POA: Diagnosis not present

## 2024-05-28 DIAGNOSIS — E162 Hypoglycemia, unspecified: Secondary | ICD-10-CM | POA: Diagnosis not present

## 2024-05-28 DIAGNOSIS — E11649 Type 2 diabetes mellitus with hypoglycemia without coma: Secondary | ICD-10-CM | POA: Diagnosis not present

## 2024-05-28 LAB — CBG MONITORING, ED
Glucose-Capillary: 68 mg/dL — ABNORMAL LOW (ref 70–99)
Glucose-Capillary: 143 mg/dL — ABNORMAL HIGH (ref 70–99)

## 2024-05-28 LAB — LIPASE, BLOOD: Lipase: 16 U/L (ref 11–51)

## 2024-05-28 LAB — URINALYSIS, ROUTINE W REFLEX MICROSCOPIC
Bilirubin Urine: NEGATIVE
Glucose, UA: NEGATIVE mg/dL
Hgb urine dipstick: NEGATIVE
Ketones, ur: NEGATIVE mg/dL
Leukocytes,Ua: NEGATIVE
Nitrite: NEGATIVE
Protein, ur: NEGATIVE mg/dL
Specific Gravity, Urine: 1.008 (ref 1.005–1.030)
pH: 6 (ref 5.0–8.0)

## 2024-05-28 LAB — CBC
HCT: 34.9 % — ABNORMAL LOW (ref 36.0–46.0)
Hemoglobin: 11.3 g/dL — ABNORMAL LOW (ref 12.0–15.0)
MCH: 31.4 pg (ref 26.0–34.0)
MCHC: 32.4 g/dL (ref 30.0–36.0)
MCV: 96.9 fL (ref 80.0–100.0)
Platelets: 322 K/uL (ref 150–400)
RBC: 3.6 MIL/uL — ABNORMAL LOW (ref 3.87–5.11)
RDW: 14.6 % (ref 11.5–15.5)
WBC: 7.1 K/uL (ref 4.0–10.5)
nRBC: 0 % (ref 0.0–0.2)

## 2024-05-28 LAB — COMPREHENSIVE METABOLIC PANEL WITH GFR
ALT: 23 U/L (ref 0–44)
AST: 57 U/L — ABNORMAL HIGH (ref 15–41)
Albumin: 2.3 g/dL — ABNORMAL LOW (ref 3.5–5.0)
Alkaline Phosphatase: 78 U/L (ref 38–126)
Anion gap: 8 (ref 5–15)
BUN: 7 mg/dL — ABNORMAL LOW (ref 8–23)
CO2: 23 mmol/L (ref 22–32)
Calcium: 7.9 mg/dL — ABNORMAL LOW (ref 8.9–10.3)
Chloride: 107 mmol/L (ref 98–111)
Creatinine, Ser: 0.83 mg/dL (ref 0.44–1.00)
GFR, Estimated: >60 mL/min (ref >60)
Glucose, Bld: 80 mg/dL (ref 70–99)
Potassium: 3.7 mmol/L (ref 3.5–5.1)
Sodium: 138 mmol/L (ref 135–145)
Total Bilirubin: 0.4 mg/dL (ref 0.0–1.2)
Total Protein: 5.4 g/dL — ABNORMAL LOW (ref 6.5–8.1)

## 2024-05-28 LAB — TROPONIN I (HIGH SENSITIVITY): Troponin I (High Sensitivity): 7 ng/L (ref <18)

## 2024-05-28 MED ORDER — DEXTROSE 50 % IV SOLN
50.0000 mL | Freq: Once | INTRAVENOUS | Status: AC
  Administered 2024-05-28: 50 mL via INTRAVENOUS
  Filled 2024-05-28: qty 50

## 2024-05-28 MED ORDER — ONDANSETRON 4 MG PO TBDP
4.0000 mg | ORAL_TABLET | Freq: Three times a day (TID) | ORAL | 0 refills | Status: AC | PRN
Start: 2024-05-28 — End: ?

## 2024-05-28 MED ORDER — ONDANSETRON HCL 4 MG/2ML IJ SOLN
4.0000 mg | Freq: Once | INTRAMUSCULAR | Status: AC
  Administered 2024-05-28: 4 mg via INTRAVENOUS
  Filled 2024-05-28: qty 2

## 2024-05-28 NOTE — ED Notes (Signed)
 While in triage we sent down a blue and two gold tops along with orders due then

## 2024-05-28 NOTE — ED Notes (Signed)
 Main lab notified of added on blood work and to use existing sample.

## 2024-05-28 NOTE — ED Provider Notes (Signed)
  EMERGENCY DEPARTMENT AT [facility] Provider Note   CSN: [id number] Arrival date & time: 05/28/24  9652     Patient presents with: Hypoglycemia   Jill Higgins is a 78 y.o. female.   Patient presents the ER secondary to weakness and just general not feeling well.  She states that she had some insulin  changes recently and since then she has felt significantly weak with throwing up and dizziness.  Her blood sugars oftentimes are get down below 50.  This is what happened tonight and she presents here for further evaluation.   Hypoglycemia      Prior to Admission medications   Medication Sig Start Date End Date Taking? Authorizing Provider  ondansetron  (ZOFRAN -ODT) 4 MG disintegrating tablet Take 1 tablet (4 mg total) by mouth every 8 (eight) hours as needed for vomiting. 05/28/24  Yes Sevin Langenbach, Selinda, MD  amLODipine  (NORVASC ) 5 MG tablet Take 5 mg by mouth daily. 01/13/24   [provider]  apixaban  (ELIQUIS ) 5 MG TABS tablet Take 1 tablet (5 mg total) by mouth 2 (two) times daily. 03/21/24   Magda Debby SAILOR, MD  APIXABAN  (ELIQUIS ) VTE STARTER PACK (10MG  AND 5MG ) Take as directed on package: start with two-5mg  tablets twice daily for 7 days. On day 8, switch to one-5mg  tablet twice daily. 02/13/24   Magda Debby SAILOR, MD  aspirin  EC 81 MG tablet Take 81 mg by mouth at bedtime.    [provider]  atorvastatin  (LIPITOR) 80 MG tablet Take 1 tablet (80 mg total) by mouth daily. Patient taking differently: Take 80 mg by mouth at bedtime. 04/21/23 05/05/24  Baglia, Corrina, PA-C  Blood Glucose Monitoring Suppl (ONETOUCH VERIO FLEX SYSTEM) w/Device KIT USE TO CHECK BLOOD SUGAR 3-4 TIMES DAY 03/11/24   Trixie File, MD  cilostazol  (PLETAL ) 100 MG tablet Take 1 tablet (100 mg total) by mouth 2 (two) times daily before a meal. 04/22/24   Magda Debby SAILOR, MD  Continuous Glucose Receiver (FREESTYLE LIBRE 3 READER) DEVI Use to monitor glucose continuously. 09/24/23    Trixie File, MD  Continuous Glucose Sensor (FREESTYLE LIBRE 3 PLUS SENSOR) MISC Inject 1 Device into the skin continuous. Change every 15 days 02/26/24   Trixie File, MD  diclofenac  Sodium (VOLTAREN ) 1 % GEL Apply 1 application. topically daily as needed (leg pain). 01/05/22   [provider]  DULoxetine  (CYMBALTA ) 60 MG capsule Take 60 mg by mouth daily. 04/19/22   [provider]  ferrous sulfate  325 (65 FE) MG EC tablet Take 325 mg by mouth daily.    [provider]  gabapentin  (NEURONTIN ) 300 MG capsule Take 1 capsule (300 mg total) by mouth 3 (three) times daily. 12/19/23   Krishnan, Gokul, MD  glucose blood [facility] VERIO) test strip Use to check blood sugar 3-4 times a day 01/15/24   Trixie File, MD  insulin  aspart (NOVOLOG  FLEXPEN) 100 UNIT/ML FlexPen 4-8 units with every meal Patient taking differently: 2-4 units 07/10/23   Trixie File, MD  insulin  glargine (LANTUS  SOLOSTAR) 100 UNIT/ML Solostar Pen Inject 5 Units into the skin at bedtime. Patient taking differently: Inject 10 Units into the skin at bedtime. 10 units daily 12/19/23   Krishnan, Gokul, MD  Insulin  Pen Needle 32G X 4 MM MISC Use 4x a day 09/27/23   Trixie File, MD  latanoprost  (XALATAN ) 0.005 % ophthalmic solution Place 1 drop into both eyes at bedtime. 04/16/22   [provider]  lipase/protease/amylase 24000-76000 units CPEP Take  1 capsule (24,000 Units total) by mouth 3 (three) times daily before meals. Patient taking differently: Take 24,000-48,000 Units by mouth See admin instructions. Take 2 capsules before meals and 1 capsule after meals. 01/31/22   Jillian Buttery, MD  MAGNESIUM  PO Take 1 tablet by mouth daily.    [provider]  metFORMIN  (GLUCOPHAGE -XR) 500 MG 24 hr tablet Take 1 tablet (500 mg total) by mouth 2 (two) times daily with a meal. 07/20/23   Trixie File, MD  Multiple Vitamins-Minerals (CENTRUM SILVER  50+WOMEN) TABS Take 1 tablet by  mouth daily.    [provider]  mupirocin  ointment (BACTROBAN ) 2 % Apply 1 Application topically 2 (two) times daily. Right 1st toe medial border 02/28/24   Lamount Ethan CROME, DPM  OneTouch Delica Lancets 33G MISC Check blood sugar 3-4 times a day 01/15/24   Trixie File, MD  oxybutynin  (DITROPAN -XL) 10 MG 24 hr tablet Take 10 mg by mouth daily. 07/25/23   [provider]  oxyCODONE  (OXY IR/ROXICODONE ) 5 MG immediate release tablet Take 1-2 tablets (5-10 mg total) by mouth every 4 (four) hours as needed for moderate pain (pain score 4-6). 12/19/23   Krishnan, Gokul, MD  pantoprazole  (PROTONIX ) 40 MG tablet Take 40 mg by mouth every evening. 10/25/15   [provider]  polyethylene glycol (MIRALAX  / GLYCOLAX ) 17 g packet Take 17 g by mouth daily as needed for mild constipation. 12/19/23   Krishnan, Gokul, MD  senna-docusate (SENOKOT-S) 8.6-50 MG tablet Take 1 tablet by mouth daily. 12/19/23   Krishnan, Gokul, MD  temazepam  (RESTORIL ) 30 MG capsule Take 1 capsule (30 mg total) by mouth at bedtime as needed for sleep. 12/19/23   Krishnan, Gokul, MD  timolol  (TIMOPTIC ) 0.5 % ophthalmic solution Place 1 drop into both eyes every morning. 12/20/21   [provider]  traMADol  (ULTRAM ) 50 MG tablet Take 50 mg by mouth every 6 (six) hours as needed. 02/05/24   [provider]  traZODone  (DESYREL ) 50 MG tablet Take 75 mg by mouth at bedtime as needed for sleep.    [provider]    Allergies: Wound dressing adhesive    Review of Systems  Updated Vital Signs BP (!) 150/72   Pulse (!) 58   Temp 97.7 F (36.5 C) (Oral)   Resp 16   Ht 5' 3 (1.6 m)   Wt 44 kg   SpO2 100%   BMI 17.18 kg/m   Physical Exam Vitals and nursing note reviewed.  Constitutional:      Appearance: She is well-developed.     Comments: Sleepy, slow to respond.  HENT:     Head: Normocephalic and atraumatic.  Cardiovascular:     Rate and Rhythm: Normal rate and regular rhythm.   Pulmonary:     Effort: No respiratory distress.     Breath sounds: No stridor.  Abdominal:     General: There is no distension.  Musculoskeletal:     Cervical back: Normal range of motion.  Skin:    Coloration: Skin is pale.  Neurological:     Mental Status: She is alert.     (all labs ordered are listed, but only abnormal results are displayed) Labs Reviewed  COMPREHENSIVE METABOLIC PANEL WITH GFR - Abnormal; Notable for the following components:      Result Value   BUN 7 (*)    Calcium  7.9 (*)    Total Protein 5.4 (*)    Albumin  2.3 (*)    AST 57 (*)  All other components within normal limits  CBC - Abnormal; Notable for the following components:   RBC 3.60 (*)    Hemoglobin 11.3 (*)    HCT 34.9 (*)    All other components within normal limits  CBG MONITORING, ED - Abnormal; Notable for the following components:   Glucose-Capillary 143 (*)    All other components within normal limits  CBG MONITORING, ED - Abnormal; Notable for the following components:   Glucose-Capillary 68 (*)    All other components within normal limits  CULTURE, BLOOD (ROUTINE X 2)  CULTURE, BLOOD (ROUTINE X 2)  URINALYSIS, ROUTINE W REFLEX MICROSCOPIC  LIPASE, BLOOD  TROPONIN I (HIGH SENSITIVITY)    EKG: EKG Interpretation Date/Time:  Wednesday May 28 2024 04:14:55 EDT Ventricular Rate:  82 PR Interval:    QRS Duration:  70 QT Interval:  372 QTC Calculation: 434 R Axis:   57  Text Interpretation: difficult to see p waves, but likely sinus rhythm Cannot rule out Anterior infarct , age undetermined Abnormal ECG When compared with ECG of 20-Apr-2023 07:28, PREVIOUS ECG IS PRESENT Confirmed by Lorette Mayo [phone]) on 05/28/2024 5:12:24 AM  Radiology: DG Chest Portable 1 View Result Date: 05/28/2024 EXAM: 1 VIEW XRAY OF THE CHEST 05/28/2024 04:35:00 AM COMPARISON: 12/07/2023 CLINICAL HISTORY: Hypoglycemia. FINDINGS: LUNGS AND PLEURA: No focal pulmonary opacity. No pulmonary edema. No  pleural effusion. No pneumothorax. HEART AND MEDIASTINUM: No acute abnormality of the cardiac and mediastinal silhouettes. BONES AND SOFT TISSUES: No acute osseous abnormality. IMPRESSION: 1. No acute process. Electronically signed by: Lonni Necessary MD 05/28/2024 04:53 AM EDT RP Workstation: [id number]     .Critical Care  Performed by: Lorette Mayo, MD Authorized by: Lorette Mayo, MD   Critical care provider statement:    Critical care time (minutes):  30   Critical care was necessary to treat or prevent imminent or life-threatening deterioration of the following conditions:  Metabolic crisis   Critical care was time spent personally by me on the following activities:  Development of treatment plan with patient or surrogate, discussions with consultants, evaluation of patient's response to treatment, examination of patient, ordering and review of laboratory studies, ordering and review of radiographic studies, ordering and performing treatments and interventions, pulse oximetry, re-evaluation of patient's condition and review of old charts    Medications Ordered in the ED  ondansetron  (ZOFRAN ) injection 4 mg (4 mg Intravenous Given 05/28/24 0515)  dextrose  50 % solution 50 mL (50 mLs Intravenous Given 05/28/24 0517)                                    Medical Decision Making Amount and/or Complexity of Data Reviewed Labs: ordered. Radiology: ordered. ECG/medicine tests: ordered.  Risk Prescription drug management.   Patient is hypoglycemic.  She is able to stay in the 50-60 range with oral intake so an amp of D50 was given.  Patient was observed for about an hour and a half after that may be 2 hours and persistently better color, more cheerful smiling and feeling well.  Troponin negative doubt ACS as the cause.  Kidney function is baseline doubt that being related I think it is related to the increased insulin  dose.  She will hold on increased insulin  dose for now and just do her  long-acting insulin  and talk to her diabetes doctor later today about further management. Also encouraged multiple sweet foods at home in case  she feels bad again.      Final diagnoses:  Hypoglycemia    ED Discharge Orders          Ordered    ondansetron  (ZOFRAN -ODT) 4 MG disintegrating tablet  Every 8 hours PRN        05/28/24 0642               Dejai Schubach, Selinda, MD 05/28/24 2352

## 2024-05-28 NOTE — ED Triage Notes (Signed)
 Patient here experiencing hypoglycemia. Reports her CBG was 50 at home. She is diabetic and insulin  dependent. Last had insulin  at 1 this AM. Poor PO intake. Endorses nausea and weakness. Denies abdominal pain.

## 2024-05-28 NOTE — ED Notes (Signed)
 CBG 68, glucometer override. RN notified. Pt provide with juice.

## 2024-05-28 NOTE — Telephone Encounter (Signed)
 Patient called to advise blood sugar of 59 (after ED last night with low blood sugar) - she is symptomatic with low BS - was advised at ED discharge to call our office if blood sugar drops again

## 2024-05-28 NOTE — Telephone Encounter (Signed)
 I spoke with the patient and she states that Last night/early this morning her blood sugar was running in  the 50-60s. She went to the ED around 3 am, Before she called the office she was at 103 but she drank some orange juice.  She has not really been eating. She states that every time she puts some food to her mouth she gets very nauseous and can not eat.  While on the phone with her she stated that her glucose went to 97 on her Cgm and via fingerstick it was 95. She is still taking her current regimen as advised from last ov. Also I did ask her if she had any glucose tabs and she said she does but she did not take any when her blood sugar dropped.

## 2024-05-28 NOTE — Telephone Encounter (Signed)
 Pt has been notified and voices understanding.

## 2024-05-28 NOTE — Telephone Encounter (Signed)
 J, I reviewed the note.   I am not sure why she has the nausea.  She is on metformin  and NovoLog  only.  We could go ahead and stop the metformin  to see if the nausea improved.   Regarding the hypoglycemia, she only takes NovoLog  (few units) before meals.  I would probably continue this practice, but we may need to reduce the doses by 1 unit.  Also, it is paramount that she takes the NovoLog  15 minutes before the meals, rather than after meals.  I am not sure if she did not take the NovoLog  after dinner last night as this could have precipitated the drop in blood sugars overnight.  Please ask her to monitor the blood sugars carefully and let me know if she has more lows.  For now, try to stop the metformin , as discussed.

## 2024-05-28 NOTE — ED Notes (Signed)
 240 mls of orange juice given for CBG of 68.

## 2024-05-29 DIAGNOSIS — R110 Nausea: Secondary | ICD-10-CM | POA: Diagnosis not present

## 2024-05-29 DIAGNOSIS — R531 Weakness: Secondary | ICD-10-CM | POA: Diagnosis not present

## 2024-06-02 LAB — CULTURE, BLOOD (ROUTINE X 2)
Culture: NO GROWTH
Culture: NO GROWTH
Special Requests: ADEQUATE
Special Requests: ADEQUATE

## 2024-06-03 DIAGNOSIS — Z961 Presence of intraocular lens: Secondary | ICD-10-CM | POA: Diagnosis not present

## 2024-06-03 DIAGNOSIS — H401133 Primary open-angle glaucoma, bilateral, severe stage: Secondary | ICD-10-CM | POA: Diagnosis not present

## 2024-06-03 DIAGNOSIS — H524 Presbyopia: Secondary | ICD-10-CM | POA: Diagnosis not present

## 2024-06-03 DIAGNOSIS — H35033 Hypertensive retinopathy, bilateral: Secondary | ICD-10-CM | POA: Diagnosis not present

## 2024-06-03 DIAGNOSIS — H53433 Sector or arcuate defects, bilateral: Secondary | ICD-10-CM | POA: Diagnosis not present

## 2024-06-03 LAB — HM DIABETES EYE EXAM

## 2024-06-06 ENCOUNTER — Encounter: Payer: Self-pay | Admitting: Podiatry

## 2024-06-06 ENCOUNTER — Ambulatory Visit: Admitting: Podiatry

## 2024-06-06 DIAGNOSIS — I739 Peripheral vascular disease, unspecified: Secondary | ICD-10-CM

## 2024-06-06 DIAGNOSIS — L84 Corns and callosities: Secondary | ICD-10-CM

## 2024-06-06 DIAGNOSIS — B351 Tinea unguium: Secondary | ICD-10-CM | POA: Diagnosis not present

## 2024-06-06 DIAGNOSIS — L600 Ingrowing nail: Secondary | ICD-10-CM

## 2024-06-06 DIAGNOSIS — M79674 Pain in right toe(s): Secondary | ICD-10-CM | POA: Diagnosis not present

## 2024-06-06 DIAGNOSIS — M79675 Pain in left toe(s): Secondary | ICD-10-CM | POA: Diagnosis not present

## 2024-06-06 DIAGNOSIS — E1159 Type 2 diabetes mellitus with other circulatory complications: Secondary | ICD-10-CM | POA: Diagnosis not present

## 2024-06-06 MED ORDER — SILVER SULFADIAZINE 1 % EX CREA: 1.0000 | TOPICAL_CREAM | Freq: Every day | CUTANEOUS | 0 refills | Status: AC

## 2024-06-06 MED ORDER — AMOXICILLIN-POT CLAVULANATE 875-125 MG PO TABS: 1.0000 | ORAL_TABLET | Freq: Two times a day (BID) | ORAL | 0 refills | Status: DC

## 2024-06-06 NOTE — Progress Notes (Signed)
 Chief Complaint  Patient presents with   Toe Pain    Patient states that she has been having toe pain in her right Hallux nail on bilateral sides for the past couple of months, Patient states that she has tried taking medication for pain but it did not work. Patient also would like doctor to look at her 4th toe nail bed, because there is a black mark around the bed area of her nail.    HPI: 78 y.o. female presents today following up for continued right first toe pain.  Is been bothering her for the past couple months.  She has also noticed darkened nail changes to the right fourth toe proximal aspect.  She is diabetic.  She has severe PAD and is not a candidate for revascularization at this point to the right lower extremity.  She does follow vascular surgeon fairly closely.  Past Medical History:  Diagnosis Date   Bronchitis    Bulging disc    in neck   Depression with anxiety    Diabetes mellitus    Type II   Fibromyalgia    pt. denies   GERD (gastroesophageal reflux disease)    Glaucoma    History of bronchitis    Hyperlipidemia    Hypertension    Overactive bladder    Peripheral vascular disease (HCC)    Pneumonia    Sleep apnea    recent test negative for sleep apnea   Tobacco abuse    Urinary frequency     Past Surgical History:  Procedure Laterality Date   ABDOMINAL AORTAGRAM N/A 08/15/2013   Procedure: ABDOMINAL EZELLA;  Surgeon: Carlin FORBES Haddock, MD;  Location[facility] CATH LAB;  Service: Cardiovascular;  Laterality: N/A;   ABDOMINAL AORTAGRAM N/A 06/19/2014   Procedure: ABDOMINAL EZELLA;  Surgeon: Carlin FORBES Haddock, MD;  Location: [facility] CATH LAB;  Service: Cardiovascular;  Laterality: N/A;   ABDOMINAL AORTOGRAM W/LOWER EXTREMITY N/A 05/10/2018   Procedure: ABDOMINAL AORTOGRAM W/LOWER EXTREMITY;  Surgeon: Haddock Carlin FORBES, MD;  Location: [facility] INVASIVE CV LAB;  Service: Cardiovascular;  Laterality: N/A;  bilateral   ABDOMINAL AORTOGRAM W/LOWER EXTREMITY N/A  04/20/2023   Procedure: ABDOMINAL AORTOGRAM W/LOWER EXTREMITY;  Surgeon: Magda Debby SAILOR, MD;  Location: [facility] INVASIVE CV LAB;  Service: Cardiovascular;  Laterality: N/A;   Aortogram w/ PTA  05/14/08,  11-04-10   Bilateral aortogram w/ bilateral  SFA PTA  stenting    APPLICATION OF WOUND VAC Right 12/09/2023   Procedure: APPLICATION OF WOUND VAC;  Surgeon: Gretta Lonni PARAS, MD;  Location: [facility] OR;  Service: Vascular;  Laterality: Right;   AXILLARY-FEMORAL BYPASS GRAFT Right 11/15/2023   Procedure: RIGHT AXILLO-FEMORAL BYPASS GRAFT;  Surgeon: Magda Debby SAILOR, MD;  Location: [facility] OR;  Service: Vascular;  Laterality: Right;   AXILLARY-FEMORAL BYPASS GRAFT Right 12/09/2023   Procedure: EXCISION AXILLA-BIFEMORAL RIGHT;  Surgeon: Gretta Lonni PARAS, MD;  Location: [facility] OR;  Service: Vascular;  Laterality: Right;   BREAST SURGERY Right    boil removal   BUNIONECTOMY     L foot in the 1980s   COLONOSCOPY     2014   DILATION AND CURETTAGE OF UTERUS     X4   ENDARTERECTOMY FEMORAL Right 06/03/2018   Procedure: RIGHT FEMORAL ENDARTERECTOMY;  Surgeon: Haddock Carlin FORBES, MD;  Location: [facility] OR;  Service: Vascular;  Laterality: Right;   Epidural shots in neck      ESOPHAGOGASTRODUODENOSCOPY (EGD) WITH PROPOFOL  N/A 09/15/2019   Procedure: ESOPHAGOGASTRODUODENOSCOPY (  EGD) WITH PROPOFOL ;  Surgeon: Burnette Fallow, MD;  Location: [facility] ENDOSCOPY;  Service: Endoscopy;  Laterality: N/A;   EUS N/A 09/15/2019   Procedure: UPPER ENDOSCOPIC ULTRASOUND (EUS) LINEAR;  Surgeon: Burnette Fallow, MD;  Location: [facility] ENDOSCOPY;  Service: Endoscopy;  Laterality: N/A;   EYE SURGERY Bilateral 2016   cataract removal   FEMORAL ARTERY EXPLORATION Right 12/09/2023   Procedure: Vein Patch Right FEMORAL ARTERY;  Surgeon: Gretta Lonni PARAS, MD;  Location: [facility] OR;  Service: Vascular;  Laterality: Right;   FEMORAL-POPLITEAL BYPASS GRAFT Left 10/21/2013   Procedure: LEFT FEMORAL-POPLITEAL ARTERY BYPASS WITH SAPHENOUS VEIN GRAFT ,  POPLITEAL ENDARTERECTOMY ,INTRAOPERATIVE ARTERIOGRAM, vein patch angioplasty to popliteal artery;  Surgeon: Carlin FORBES Haddock, MD;  Location: [facility] OR;  Service: Vascular;  Laterality: Left;   FEMORAL-POPLITEAL BYPASS GRAFT Right 02/08/2016   Procedure: Right FEMORAL- to Above Knee POPLITEAL ARTERY Bypass Graft with reversed saphenous vein and Common Femoral Endarterectomy  with profundoplasty;  Surgeon: Carlin FORBES Haddock, MD;  Location: [facility] OR;  Service: Vascular;  Laterality: Right;   FINE NEEDLE ASPIRATION N/A 09/15/2019   Procedure: FINE NEEDLE ASPIRATION (FNA) LINEAR;  Surgeon: Burnette Fallow, MD;  Location: [facility] ENDOSCOPY;  Service: Endoscopy;  Laterality: N/A;   GROIN DEBRIDEMENT Left 10/28/2013   Procedure: left inner thigh DEBRIDEMENT;  Surgeon: Carlin FORBES Haddock, MD;  Location: [facility] OR;  Service: Vascular;  Laterality: Left;   HAND SURGERY Left 2010   IR FLUORO GUIDE CV LINE RIGHT  12/14/2023   IR REMOVAL TUN CV CATH W/O FL  01/04/2024   IR US  GUIDE VASC ACCESS RIGHT  12/14/2023   LOWER EXTREMITY ANGIOGRAM Bilateral 12/24/2015   Procedure: Lower Extremity Angiogram;  Surgeon: Carlin FORBES Haddock, MD;  Location: [facility] INVASIVE CV LAB;  Service: Cardiovascular;  Laterality: Bilateral;   PATCH ANGIOPLASTY Right 06/03/2018   Procedure: PATCH ANGIOPLASTY USING A XENOSURE 1CM X 14CM BIOLOGIC PATCH;  Surgeon: Haddock Carlin FORBES, MD;  Location: [facility] OR;  Service: Vascular;  Laterality: Right;   PERIPHERAL VASCULAR CATHETERIZATION N/A 12/24/2015   Procedure: Abdominal Aortogram;  Surgeon: Carlin FORBES Haddock, MD;  Location: [facility] INVASIVE CV LAB;  Service: Cardiovascular;  Laterality: N/A;   PERIPHERAL VASCULAR CATHETERIZATION Left 12/24/2015   Procedure: Peripheral Vascular Balloon Angioplasty;  Surgeon: Carlin FORBES Haddock, MD;  Location: [facility] INVASIVE CV LAB;  Service: Cardiovascular;  Laterality: Left;  drug coated balloon   PERIPHERAL VASCULAR CATHETERIZATION N/A 06/30/2016   Procedure: Abdominal Aortogram;  Surgeon: Carlin FORBES Haddock, MD;  Location: [facility] INVASIVE CV LAB;  Service: Cardiovascular;  Laterality: N/A;   PERIPHERAL VASCULAR CATHETERIZATION Bilateral 06/30/2016   Procedure: Lower Extremity Angiography;  Surgeon: Carlin FORBES Haddock, MD;  Location: [facility] INVASIVE CV LAB;  Service: Cardiovascular;  Laterality: Bilateral;   TONSILLECTOMY     TRANSESOPHAGEAL ECHOCARDIOGRAM (CATH LAB) N/A 12/13/2023   Procedure: TRANSESOPHAGEAL ECHOCARDIOGRAM;  Surgeon: Raford Riggs, MD;  Location: [facility] INVASIVE CV LAB;  Service: Cardiovascular;  Laterality: N/A;   VEIN HARVEST Left 12/09/2023   Procedure: BACILIC VEIN HARVEST LEFT ARM;  Surgeon: Gretta Lonni PARAS, MD;  Location: [facility] OR;  Service: Vascular;  Laterality: Left;   VEIN REPAIR Right 12/09/2023   Procedure: VEIN  PATCH RIGHT AXILLIA ARTERY RIGHT;  Surgeon: Gretta Lonni PARAS, MD;  Location: [facility] OR;  Service: Vascular;  Laterality: Right;    Allergies  Allergen Reactions   Wound Dressing Adhesive Rash and Other (See Comments)    Pt states that tape and electrodes leave red scars on her skin and her  skin is very sensitive.  Paper tape ok to use    ROS    Physical Exam: There were no vitals filed for this visit.  General: The patient is alert and oriented x3 in no acute distress.  Dermatology: Pain on palpation of right first toe.  There is some incurvation of the right hallux medial nail border.  Upon slant back nail trim of this, there is some scant purulence noted. Right fourth toenail is dystrophic, some horizontal darkening of the nail plate proximal aspect appears consistent with some low-grade stable subungual hematoma.  Vascular: Right foot pedal pulses nonpalpable.  Left foot pedal pulses weakly palpable.  Cap refill time delayed 3 to 5 seconds to the digits.  Pedal hair growth absent.  Neurological: Light touch sensation grossly intact bilateral feet.   Musculoskeletal Exam: No pedal deformities noted  Assessment/Plan of Care: 1. Ingrown toenail of  right foot with infection   2. Type 2 diabetes mellitus with vascular disease (HCC)   3. PAD (peripheral artery disease) (HCC)      Meds ordered this encounter  Medications   amoxicillin -clavulanate (AUGMENTIN ) 875-125 MG tablet    Sig: Take 1 tablet by mouth 2 (two) times daily.    Dispense:  20 tablet    Refill:  0   silver  sulfADIAZINE  (SILVADENE ) 1 % cream    Sig: Apply 1 Application topically daily.    Dispense:  50 g    Refill:  0   None  Discussed clinical findings with patient today.  Slant back nail trim of the right first toenail was noted with some purulence encountered.  Culture was obtained of the area of drainage.  Silvadene  bandage applied.  Prescribing course of oral Augmentin .  Unfortunately she is a poor candidate for ingrown toenail removal due to the extent of her PAD.  Would have concern of difficulty healing a procedure like this which could lead to further infection and tissue loss.  Instructed patient to monitor site closely for signs of worsening infection.  Continue continue to apply the Silvadene  and wash area with soapy water daily.  Pat dry.  Right fourth toe dystrophic toenail was debrided today as a courtesy without incident.  Given that she is at risk for complications given her PAD I would like to see her back in 2 weeks to see how the toe is progressing.  Que Meneely L. Lamount MAUL, AACFAS [facility]     [address] [place], [place] [address]                Office [phone]  Fax [phone]

## 2024-06-09 LAB — WOUND CULTURE
MICRO NUMBER:: 16717726
SPECIMEN QUALITY:: ADEQUATE

## 2024-06-12 ENCOUNTER — Ambulatory Visit: Payer: Self-pay | Admitting: Podiatry

## 2024-06-12 DIAGNOSIS — B353 Tinea pedis: Secondary | ICD-10-CM

## 2024-06-12 MED ORDER — FLUCONAZOLE 150 MG PO TABS: 150.0000 mg | ORAL_TABLET | Freq: Once | ORAL | 0 refills | Status: AC

## 2024-06-19 DIAGNOSIS — E1169 Type 2 diabetes mellitus with other specified complication: Secondary | ICD-10-CM | POA: Diagnosis not present

## 2024-06-19 DIAGNOSIS — H409 Unspecified glaucoma: Secondary | ICD-10-CM | POA: Diagnosis not present

## 2024-06-19 DIAGNOSIS — E1165 Type 2 diabetes mellitus with hyperglycemia: Secondary | ICD-10-CM | POA: Diagnosis not present

## 2024-06-19 DIAGNOSIS — E7800 Pure hypercholesterolemia, unspecified: Secondary | ICD-10-CM | POA: Diagnosis not present

## 2024-06-20 ENCOUNTER — Ambulatory Visit: Admitting: Podiatry

## 2024-06-20 ENCOUNTER — Encounter: Payer: Self-pay | Admitting: Podiatry

## 2024-06-20 DIAGNOSIS — E1159 Type 2 diabetes mellitus with other circulatory complications: Secondary | ICD-10-CM | POA: Diagnosis not present

## 2024-06-20 DIAGNOSIS — L600 Ingrowing nail: Secondary | ICD-10-CM | POA: Diagnosis not present

## 2024-06-20 NOTE — Progress Notes (Signed)
 Chief Complaint  Patient presents with   Nail Problem    Pt is here for nail check from ingrown R big toe.  She has some pain when walking and laying down. Pain level 5. Finished antibiotic Diabetic Last A1c 7.4    HPI: 78 y.o. female presents today following following right first toe slant back procedure with superficial abscess.  She finished antibiotics without incident.  Her pain is improved but is still present, describes as a dull ache, 5/10.  Does have type 2 diabetes with known PAD, has been deemed a poor candidate for revascularization.  Past Medical History:  Diagnosis Date   Bronchitis    Bulging disc    in neck   Depression with anxiety    Diabetes mellitus    Type II   Fibromyalgia    pt. denies   GERD (gastroesophageal reflux disease)    Glaucoma    History of bronchitis    Hyperlipidemia    Hypertension    Overactive bladder    Peripheral vascular disease (HCC)    Pneumonia    Sleep apnea    recent test negative for sleep apnea   Tobacco abuse    Urinary frequency     Past Surgical History:  Procedure Laterality Date   ABDOMINAL AORTAGRAM N/A 08/15/2013   Procedure: ABDOMINAL EZELLA;  Surgeon: Carlin FORBES Haddock, MD;  Location: [facility] CATH LAB;  Service: Cardiovascular;  Laterality: N/A;   ABDOMINAL AORTAGRAM N/A 06/19/2014   Procedure: ABDOMINAL EZELLA;  Surgeon: Carlin FORBES Haddock, MD;  Location: [facility] CATH LAB;  Service: Cardiovascular;  Laterality: N/A;   ABDOMINAL AORTOGRAM W/LOWER EXTREMITY N/A 05/10/2018   Procedure: ABDOMINAL AORTOGRAM W/LOWER EXTREMITY;  Surgeon: Haddock Carlin FORBES, MD;  Location: [facility] INVASIVE CV LAB;  Service: Cardiovascular;  Laterality: N/A;  bilateral   ABDOMINAL AORTOGRAM W/LOWER EXTREMITY N/A 04/20/2023   Procedure: ABDOMINAL AORTOGRAM W/LOWER EXTREMITY;  Surgeon: Magda Debby SAILOR, MD;  Location: [facility] INVASIVE CV LAB;  Service: Cardiovascular;  Laterality: N/A;   Aortogram w/ PTA  05/14/08,  11-04-10   Bilateral aortogram w/  bilateral  SFA PTA  stenting    APPLICATION OF WOUND VAC Right 12/09/2023   Procedure: APPLICATION OF WOUND VAC;  Surgeon: Gretta Lonni PARAS, MD;  Location: [facility] OR;  Service: Vascular;  Laterality: Right;   AXILLARY-FEMORAL BYPASS GRAFT Right 11/15/2023   Procedure: RIGHT AXILLO-FEMORAL BYPASS GRAFT;  Surgeon: Magda Debby SAILOR, MD;  Location: [facility] OR;  Service: Vascular;  Laterality: Right;   AXILLARY-FEMORAL BYPASS GRAFT Right 12/09/2023   Procedure: EXCISION AXILLA-BIFEMORAL RIGHT;  Surgeon: Gretta Lonni PARAS, MD;  Location: [facility] OR;  Service: Vascular;  Laterality: Right;   BREAST SURGERY Right    boil removal   BUNIONECTOMY     L foot in the 1980s   COLONOSCOPY     2014   DILATION AND CURETTAGE OF UTERUS     X4   ENDARTERECTOMY FEMORAL Right 06/03/2018   Procedure: RIGHT FEMORAL ENDARTERECTOMY;  Surgeon: Haddock Carlin FORBES, MD;  Location: [facility] OR;  Service: Vascular;  Laterality: Right;   Epidural shots in neck      ESOPHAGOGASTRODUODENOSCOPY (EGD) WITH PROPOFOL  N/A 09/15/2019   Procedure: ESOPHAGOGASTRODUODENOSCOPY (EGD) WITH PROPOFOL ;  Surgeon: Burnette Fallow, MD;  Location: [facility] ENDOSCOPY;  Service: Endoscopy;  Laterality: N/A;   EUS N/A 09/15/2019   Procedure: UPPER ENDOSCOPIC ULTRASOUND (EUS) LINEAR;  Surgeon: Burnette Fallow, MD;  Location: [facility] ENDOSCOPY;  Service: Endoscopy;  Laterality: N/A;   EYE SURGERY  Bilateral 2016   cataract removal   FEMORAL ARTERY EXPLORATION Right 12/09/2023   Procedure: Vein Patch Right FEMORAL ARTERY;  Surgeon: Gretta Lonni PARAS, MD;  Location: [facility] OR;  Service: Vascular;  Laterality: Right;   FEMORAL-POPLITEAL BYPASS GRAFT Left 10/21/2013   Procedure: LEFT FEMORAL-POPLITEAL ARTERY BYPASS WITH SAPHENOUS VEIN GRAFT , POPLITEAL ENDARTERECTOMY ,INTRAOPERATIVE ARTERIOGRAM, vein patch angioplasty to popliteal artery;  Surgeon: Carlin FORBES Haddock, MD;  Location: [facility] OR;  Service: Vascular;  Laterality: Left;   FEMORAL-POPLITEAL BYPASS GRAFT Right 02/08/2016    Procedure: Right FEMORAL- to Above Knee POPLITEAL ARTERY Bypass Graft with reversed saphenous vein and Common Femoral Endarterectomy  with profundoplasty;  Surgeon: Carlin FORBES Haddock, MD;  Location: [facility] OR;  Service: Vascular;  Laterality: Right;   FINE NEEDLE ASPIRATION N/A 09/15/2019   Procedure: FINE NEEDLE ASPIRATION (FNA) LINEAR;  Surgeon: Burnette Fallow, MD;  Location: [facility] ENDOSCOPY;  Service: Endoscopy;  Laterality: N/A;   GROIN DEBRIDEMENT Left 10/28/2013   Procedure: left inner thigh DEBRIDEMENT;  Surgeon: Carlin FORBES Haddock, MD;  Location: [facility] OR;  Service: Vascular;  Laterality: Left;   HAND SURGERY Left 2010   IR FLUORO GUIDE CV LINE RIGHT  12/14/2023   IR REMOVAL TUN CV CATH W/O FL  01/04/2024   IR US  GUIDE VASC ACCESS RIGHT  12/14/2023   LOWER EXTREMITY ANGIOGRAM Bilateral 12/24/2015   Procedure: Lower Extremity Angiogram;  Surgeon: Carlin FORBES Haddock, MD;  Location: [facility] INVASIVE CV LAB;  Service: Cardiovascular;  Laterality: Bilateral;   PATCH ANGIOPLASTY Right 06/03/2018   Procedure: PATCH ANGIOPLASTY USING A XENOSURE 1CM X 14CM BIOLOGIC PATCH;  Surgeon: Haddock Carlin FORBES, MD;  Location: [facility] OR;  Service: Vascular;  Laterality: Right;   PERIPHERAL VASCULAR CATHETERIZATION N/A 12/24/2015   Procedure: Abdominal Aortogram;  Surgeon: Carlin FORBES Haddock, MD;  Location: [facility] INVASIVE CV LAB;  Service: Cardiovascular;  Laterality: N/A;   PERIPHERAL VASCULAR CATHETERIZATION Left 12/24/2015   Procedure: Peripheral Vascular Balloon Angioplasty;  Surgeon: Carlin FORBES Haddock, MD;  Location: [facility] INVASIVE CV LAB;  Service: Cardiovascular;  Laterality: Left;  drug coated balloon   PERIPHERAL VASCULAR CATHETERIZATION N/A 06/30/2016   Procedure: Abdominal Aortogram;  Surgeon: Carlin FORBES Haddock, MD;  Location: [facility] INVASIVE CV LAB;  Service: Cardiovascular;  Laterality: N/A;   PERIPHERAL VASCULAR CATHETERIZATION Bilateral 06/30/2016   Procedure: Lower Extremity Angiography;  Surgeon: Carlin FORBES Haddock, MD;  Location: [facility] INVASIVE  CV LAB;  Service: Cardiovascular;  Laterality: Bilateral;   TONSILLECTOMY     TRANSESOPHAGEAL ECHOCARDIOGRAM (CATH LAB) N/A 12/13/2023   Procedure: TRANSESOPHAGEAL ECHOCARDIOGRAM;  Surgeon: Raford Riggs, MD;  Location: [facility] INVASIVE CV LAB;  Service: Cardiovascular;  Laterality: N/A;   VEIN HARVEST Left 12/09/2023   Procedure: BACILIC VEIN HARVEST LEFT ARM;  Surgeon: Gretta Lonni PARAS, MD;  Location: [facility] OR;  Service: Vascular;  Laterality: Left;   VEIN REPAIR Right 12/09/2023   Procedure: VEIN  PATCH RIGHT AXILLIA ARTERY RIGHT;  Surgeon: Gretta Lonni PARAS, MD;  Location: [facility] OR;  Service: Vascular;  Laterality: Right;    Allergies  Allergen Reactions   Wound Dressing Adhesive Rash and Other (See Comments)    Pt states that tape and electrodes leave red scars on her skin and her skin is very sensitive.  Paper tape ok to use    ROS    Physical Exam: There were no vitals filed for this visit.  General: The patient is alert and oriented x3 in no acute distress.  Dermatology: There is still some tenderness on palpation of  the right first toenail.  Slant back nail trim site showing some signs of delayed healing but no signs of acute bacterial infection.  No drainage noted.  Vascular: Right foot pedal pulses nonpalpable.  Left foot pedal pulses weakly palpable.  Cap refill time delayed 3 to 5 seconds to the digits.  Pedal hair growth absent.  Neurological: Light touch sensation grossly intact bilateral feet.   Musculoskeletal Exam: No pedal deformities noted  Assessment/Plan of Care: 1. Type 2 diabetes mellitus with vascular disease (HCC)   2. Ingrown toenail of right foot      No orders of the defined types were placed in this encounter.  None  Discussed clinical findings with patient today.  Infection does appear resolved.  Will need to monitor site closely due to decreased circulation, delayed healing.  Continue with applications of home antibiotic ointment and bandage.   Bacitracin and Band-Aid applied today to right hallux medial nail border.  She does relate that she is starting get similar pain to the left first toe, the nail is not showing signs of ingrowing.  She does have upcoming follow-up with Dr. Loreda for diabetic footcare.  Follow-up as needed in the interim if there is signs of delayed wound healing  Ismahan Lippman L. Lamount MAUL, AACFAS [facility]     [address] [place], [place] [address]                Office [phone]  Fax [phone]

## 2024-06-30 ENCOUNTER — Encounter: Payer: Self-pay | Admitting: Physical Medicine and Rehabilitation

## 2024-06-30 ENCOUNTER — Encounter: Attending: Physical Medicine and Rehabilitation | Admitting: Physical Medicine and Rehabilitation

## 2024-06-30 VITALS — BP 138/76 | HR 73 | Ht 63.0 in | Wt 99.4 lb

## 2024-06-30 DIAGNOSIS — G894 Chronic pain syndrome: Secondary | ICD-10-CM | POA: Diagnosis not present

## 2024-06-30 DIAGNOSIS — M5441 Lumbago with sciatica, right side: Secondary | ICD-10-CM | POA: Diagnosis not present

## 2024-06-30 DIAGNOSIS — G8929 Other chronic pain: Secondary | ICD-10-CM | POA: Diagnosis not present

## 2024-06-30 DIAGNOSIS — I739 Peripheral vascular disease, unspecified: Secondary | ICD-10-CM | POA: Diagnosis not present

## 2024-06-30 DIAGNOSIS — Z5181 Encounter for therapeutic drug level monitoring: Secondary | ICD-10-CM | POA: Insufficient documentation

## 2024-06-30 DIAGNOSIS — K861 Other chronic pancreatitis: Secondary | ICD-10-CM | POA: Insufficient documentation

## 2024-06-30 DIAGNOSIS — M7061 Trochanteric bursitis, right hip: Secondary | ICD-10-CM | POA: Diagnosis not present

## 2024-06-30 DIAGNOSIS — Z79891 Long term (current) use of opiate analgesic: Secondary | ICD-10-CM | POA: Diagnosis not present

## 2024-06-30 DIAGNOSIS — E1159 Type 2 diabetes mellitus with other circulatory complications: Secondary | ICD-10-CM | POA: Diagnosis not present

## 2024-06-30 DIAGNOSIS — Z794 Long term (current) use of insulin: Secondary | ICD-10-CM

## 2024-06-30 MED ORDER — TRAMADOL HCL 50 MG PO TABS: 100.0000 mg | ORAL_TABLET | Freq: Three times a day (TID) | ORAL | 1 refills | Status: DC | PRN

## 2024-06-30 NOTE — Patient Instructions (Addendum)
 Follow-up with my nurse practitioner Fidela in 1 month.  If pain is well-controlled on your current regimen, you will see her every other month and me every 6 months.  If not, we will follow-up more frequently.  Feel free to use MyChart between appointments to discuss any acute issues, making usually get back to within 48 hours.   I am increasing your tramadol  to 100 mg 2-3x daily; please try to keep to twice daily as much as possible.  We discussed hesitance with higher dose opiates given current weight loss and pancreatic issues; we can re-address this as needed.  Continue cymbalta   You can stop gabapentin  as it has not been beneficial.  Continue following with Dr. Vonzell for vascular claudication.  Since you already failed PT, we will get an MRI of your low back to look for possible nerve impingements. Since you have had extreme blood sugar responses to steroid injections in the past, we may recommend low dose or non-steroid interventions if the findings show severe nerve pinching on the right.

## 2024-06-30 NOTE — Progress Notes (Unsigned)
 Subjective:    Patient ID: Jill Higgins, female    DOB: 1945/12/13, 78 y.o.   MRN: [id number]  HPI HPI  Jill Higgins is a 78 y.o. year old female  who  has a past medical history of Bronchitis, Bulging disc, Depression with anxiety, Diabetes mellitus, Fibromyalgia, GERD (gastroesophageal reflux disease), Glaucoma, History of bronchitis, Hyperlipidemia, Hypertension, Overactive bladder, Peripheral vascular disease (HCC), Pneumonia, Sleep apnea, Tobacco abuse, and Urinary frequency.   They were referred by Dr. Vonzell for treatment of right leg, hip and knee pain.  Source: Right leg pain, from thigh down to the foot. Associated with numbness and tingling primarily in the right foot, with occasional similar sensations in the left foot. Reports significant weakness and poor balance, leading to multiple falls to the right side and a fear of walking. Inciting incident: Pain began after vascular procedures, including a stent in the right leg in 2019. Reports multiple subsequent operations on the right leg. Pain significantly worsened after 11/15/2023, followed by a prolonged hospitalization for infection starting 11/27/2023. Description of pain: Numbness, stabbing pain. It comes and goes but is present most of the time. Pain is severe enough to wake from sleep nightly. Exacerbating factors: Walking (pain starts after ~20 steps), standing, laying down, walking uphill, and stairs are not tolerated. Remitting factors: Rest improves the pain after walking. Red flag symptoms: New weakness, new numbness/tingling, pain waking up at nighttime, Hx cancer, Hx trauma (multiple falls). Reports significant unintentional weight loss (from 151 lbs to 99 lbs) and decreased appetite with nausea on attempting to eat a full meal.  Medications tried: Topical: none tried NSAID: Over-the-counter NSAIDs tried, ineffective. Tylenol : none tried Gabapentin : Previously on for years, discontinued by prescriber for unknown  reason, possibly side effects. Lyrica : none tried SNRIs: Duloxetine  (Cymbalta ) was previously taken, recently discontinued by diabetes doctor. Ineffective for pain. TCAs: Amitriptyline (Elavil) never tried. Muscle relaxers: none tried Opiates: Tramadol  50 mg PRN, reports it is ineffective for pain. Oxycodone  taken post-surgically, was effective. Other: Eliquis , discontinued in June.  Other treatments: Physical/occupational therapy: Completed a nearly two-month course after hospitalization earlier this year; reported it was not helpful. Accupuncture/chiropractic/massage: none tried TENS unit: none tried Heat: none tried Injections: Cortisone injection to the knee on two occasions. Caused significant hyperglycemia (blood sugar >300-400 for a week). Surgery: Multiple vascular procedures on legs, including stenting. Pancreatic tumor resection in 2020. Other: none tried  Goals for pain control: Not explicitly stated.  Prior UDS results: No results found for: LABOPIA, COCAINSCRNUR, LABBENZ, AMPHETMU, THCU, LABBARB   Pain Inventory Average Pain 8 Pain Right Now 6 My pain is sharp and aching  In the last 24 hours, has pain interfered with the following? General activity 7 Relation with others 5 Enjoyment of life 8 What TIME of day is your pain at its worst? daytime Sleep (in general) Poor  Pain is worse with: walking and laying down Pain improves with: medication Relief from Meds: 1  walk without assistance use a cane use a walker how many minutes can you walk? 4 ability to climb steps?  no do you drive?  yes  retired I need assistance with the following:  shopping  bladder control problems bowel control problems No problems in this area weakness trouble walking anxiety  Any changes since last visit?  no  Any changes since last visit?  no    Family History  Problem Relation Age of Onset   Heart disease Father    Heart attack Father  MI at age  37   Hypertension Mother    Alzheimer's disease Mother    Hypertension Sister    Diabetes Brother    Hyperlipidemia Brother    Hypertension Brother    Heart disease Brother    Heart attack Brother    Breast cancer Maternal Aunt    Social History   Socioeconomic History   Marital status: Single    Spouse name: Not on file   Number of children: 1   Years of education: Not on file   Highest education level: Not on file  Occupational History   Occupation: Retired    Associate Professor: Scientist, water quality  Tobacco Use   Smoking status: Former    Current packs/day: 0.25    Average packs/day: 0.3 packs/day for 1 year (0.3 ttl pk-yrs)    Types: Cigarettes    Start date: 06/2023    Quit date: 10/2014    Passive exposure: Never   Smokeless tobacco: Never   Tobacco comments:    pt states that she is using the E cig only   Vaping Use   Vaping status: Some Days  Substance and Sexual Activity   Alcohol use: Yes    Alcohol/week: 0.0 - 1.0 standard drinks of alcohol    Comment: Occasional; 10-22 rarely    Drug use: No   Sexual activity: Not on file  Other Topics Concern   Not on file  Social History Narrative   Drinks about 2 cups of coffee a day, and some tea    Social Drivers of Corporate investment banker Strain: Not on file  Food Insecurity: No Food Insecurity (12/11/2023)   Hunger Vital Sign    Worried About Running Out of Food in the Last Year: Never true    Ran Out of Food in the Last Year: Never true  Transportation Needs: No Transportation Needs (12/11/2023)   PRAPARE - Administrator, Civil Service (Medical): No    Lack of Transportation (Non-Medical): No  Physical Activity: Not on file  Stress: Not on file  Social Connections: Moderately Isolated (12/11/2023)   Social Connection and Isolation Panel    Frequency of Communication with Friends and Family: More than three times a week    Frequency of Social Gatherings with Friends and Family: More than three times a week     Attends Religious Services: 1 to 4 times per year    Active Member of Clubs or Organizations: No    Attends Banker Meetings: Never    Marital Status: Divorced   Past Surgical History:  Procedure Laterality Date   ABDOMINAL AORTAGRAM N/A 08/15/2013   Procedure: ABDOMINAL EZELLA;  Surgeon: Carlin FORBES Haddock, MD;  Location: [facility] CATH LAB;  Service: Cardiovascular;  Laterality: N/A;   ABDOMINAL AORTAGRAM N/A 06/19/2014   Procedure: ABDOMINAL EZELLA;  Surgeon: Carlin FORBES Haddock, MD;  Location: [facility] CATH LAB;  Service: Cardiovascular;  Laterality: N/A;   ABDOMINAL AORTOGRAM W/LOWER EXTREMITY N/A 05/10/2018   Procedure: ABDOMINAL AORTOGRAM W/LOWER EXTREMITY;  Surgeon: Haddock Carlin FORBES, MD;  Location: [facility] INVASIVE CV LAB;  Service: Cardiovascular;  Laterality: N/A;  bilateral   ABDOMINAL AORTOGRAM W/LOWER EXTREMITY N/A 04/20/2023   Procedure: ABDOMINAL AORTOGRAM W/LOWER EXTREMITY;  Surgeon: Magda Debby SAILOR, MD;  Location: [facility] INVASIVE CV LAB;  Service: Cardiovascular;  Laterality: N/A;   Aortogram w/ PTA  05/14/08,  11-04-10   Bilateral aortogram w/ bilateral  SFA PTA  stenting    APPLICATION OF WOUND VAC Right 12/09/2023  Procedure: APPLICATION OF WOUND VAC;  Surgeon: Gretta Lonni PARAS, MD;  Location: [facility] OR;  Service: Vascular;  Laterality: Right;   AXILLARY-FEMORAL BYPASS GRAFT Right 11/15/2023   Procedure: RIGHT AXILLO-FEMORAL BYPASS GRAFT;  Surgeon: Magda Debby SAILOR, MD;  Location: [facility] OR;  Service: Vascular;  Laterality: Right;   AXILLARY-FEMORAL BYPASS GRAFT Right 12/09/2023   Procedure: EXCISION AXILLA-BIFEMORAL RIGHT;  Surgeon: Gretta Lonni PARAS, MD;  Location: [facility] OR;  Service: Vascular;  Laterality: Right;   BREAST SURGERY Right    boil removal   BUNIONECTOMY     L foot in the 1980s   COLONOSCOPY     2014   DILATION AND CURETTAGE OF UTERUS     X4   ENDARTERECTOMY FEMORAL Right 06/03/2018   Procedure: RIGHT FEMORAL ENDARTERECTOMY;  Surgeon: Harvey Carlin BRAVO, MD;   Location: [facility] OR;  Service: Vascular;  Laterality: Right;   Epidural shots in neck      ESOPHAGOGASTRODUODENOSCOPY (EGD) WITH PROPOFOL  N/A 09/15/2019   Procedure: ESOPHAGOGASTRODUODENOSCOPY (EGD) WITH PROPOFOL ;  Surgeon: Burnette Fallow, MD;  Location: [facility] ENDOSCOPY;  Service: Endoscopy;  Laterality: N/A;   EUS N/A 09/15/2019   Procedure: UPPER ENDOSCOPIC ULTRASOUND (EUS) LINEAR;  Surgeon: Burnette Fallow, MD;  Location: [facility] ENDOSCOPY;  Service: Endoscopy;  Laterality: N/A;   EYE SURGERY Bilateral 2016   cataract removal   FEMORAL ARTERY EXPLORATION Right 12/09/2023   Procedure: Vein Patch Right FEMORAL ARTERY;  Surgeon: Gretta Lonni PARAS, MD;  Location: [facility] OR;  Service: Vascular;  Laterality: Right;   FEMORAL-POPLITEAL BYPASS GRAFT Left 10/21/2013   Procedure: LEFT FEMORAL-POPLITEAL ARTERY BYPASS WITH SAPHENOUS VEIN GRAFT , POPLITEAL ENDARTERECTOMY ,INTRAOPERATIVE ARTERIOGRAM, vein patch angioplasty to popliteal artery;  Surgeon: Carlin BRAVO Harvey, MD;  Location: [facility] OR;  Service: Vascular;  Laterality: Left;   FEMORAL-POPLITEAL BYPASS GRAFT Right 02/08/2016   Procedure: Right FEMORAL- to Above Knee POPLITEAL ARTERY Bypass Graft with reversed saphenous vein and Common Femoral Endarterectomy  with profundoplasty;  Surgeon: Carlin BRAVO Harvey, MD;  Location: [facility] OR;  Service: Vascular;  Laterality: Right;   FINE NEEDLE ASPIRATION N/A 09/15/2019   Procedure: FINE NEEDLE ASPIRATION (FNA) LINEAR;  Surgeon: Burnette Fallow, MD;  Location: [facility] ENDOSCOPY;  Service: Endoscopy;  Laterality: N/A;   GROIN DEBRIDEMENT Left 10/28/2013   Procedure: left inner thigh DEBRIDEMENT;  Surgeon: Carlin BRAVO Harvey, MD;  Location: [facility] OR;  Service: Vascular;  Laterality: Left;   HAND SURGERY Left 2010   IR FLUORO GUIDE CV LINE RIGHT  12/14/2023   IR REMOVAL TUN CV CATH W/O FL  01/04/2024   IR US  GUIDE VASC ACCESS RIGHT  12/14/2023   LOWER EXTREMITY ANGIOGRAM Bilateral 12/24/2015   Procedure: Lower Extremity Angiogram;  Surgeon:  Carlin BRAVO Harvey, MD;  Location: [facility] INVASIVE CV LAB;  Service: Cardiovascular;  Laterality: Bilateral;   PATCH ANGIOPLASTY Right 06/03/2018   Procedure: PATCH ANGIOPLASTY USING A XENOSURE 1CM X 14CM BIOLOGIC PATCH;  Surgeon: Harvey Carlin BRAVO, MD;  Location: [facility] OR;  Service: Vascular;  Laterality: Right;   PERIPHERAL VASCULAR CATHETERIZATION N/A 12/24/2015   Procedure: Abdominal Aortogram;  Surgeon: Carlin BRAVO Harvey, MD;  Location: [facility] INVASIVE CV LAB;  Service: Cardiovascular;  Laterality: N/A;   PERIPHERAL VASCULAR CATHETERIZATION Left 12/24/2015   Procedure: Peripheral Vascular Balloon Angioplasty;  Surgeon: Carlin BRAVO Harvey, MD;  Location: [facility] INVASIVE CV LAB;  Service: Cardiovascular;  Laterality: Left;  drug coated balloon   PERIPHERAL VASCULAR CATHETERIZATION N/A 06/30/2016   Procedure: Abdominal Aortogram;  Surgeon: Carlin BRAVO Harvey, MD;  Location: [facility]  INVASIVE CV LAB;  Service: Cardiovascular;  Laterality: N/A;   PERIPHERAL VASCULAR CATHETERIZATION Bilateral 06/30/2016   Procedure: Lower Extremity Angiography;  Surgeon: Carlin FORBES Haddock, MD;  Location: [facility] INVASIVE CV LAB;  Service: Cardiovascular;  Laterality: Bilateral;   TONSILLECTOMY     TRANSESOPHAGEAL ECHOCARDIOGRAM (CATH LAB) N/A 12/13/2023   Procedure: TRANSESOPHAGEAL ECHOCARDIOGRAM;  Surgeon: Raford Riggs, MD;  Location: [facility] INVASIVE CV LAB;  Service: Cardiovascular;  Laterality: N/A;   VEIN HARVEST Left 12/09/2023   Procedure: BACILIC VEIN HARVEST LEFT ARM;  Surgeon: Gretta Lonni PARAS, MD;  Location: [facility] OR;  Service: Vascular;  Laterality: Left;   VEIN REPAIR Right 12/09/2023   Procedure: VEIN  PATCH RIGHT AXILLIA ARTERY RIGHT;  Surgeon: Gretta Lonni PARAS, MD;  Location: [facility] OR;  Service: Vascular;  Laterality: Right;   Past Medical History:  Diagnosis Date   Bronchitis    Bulging disc    in neck   Depression with anxiety    Diabetes mellitus    Type II   Fibromyalgia    pt. denies   GERD (gastroesophageal reflux disease)     Glaucoma    History of bronchitis    Hyperlipidemia    Hypertension    Overactive bladder    Peripheral vascular disease (HCC)    Pneumonia    Sleep apnea    recent test negative for sleep apnea   Tobacco abuse    Urinary frequency    BP 138/76   Pulse 73   Ht 5' 3 (1.6 m)   Wt 99 lb 6.4 oz (45.1 kg)   SpO2 90%   BMI 17.61 kg/m   Opioid Risk Score:   Fall Risk Score:  `1  Depression screen [facility] 2/9     06/30/2024    1:27 PM 11/11/2020    3:39 PM  Depression screen PHQ 2/9  Decreased Interest 1 0  Down, Depressed, Hopeless 1 0  PHQ - 2 Score 2 0  Altered sleeping 3   Tired, decreased energy 3   Change in appetite 2   Feeling bad or failure about yourself  0   Trouble concentrating 0   Moving slowly or fidgety/restless 2   Suicidal thoughts 0   PHQ-9 Score 12   Difficult doing work/chores Extremely dIfficult      Review of Systems  Constitutional:  Positive for appetite change and unexpected weight change.  Gastrointestinal:  Positive for diarrhea.  Endocrine:       High and low blood sugars  Musculoskeletal:  Positive for gait problem.       Vascular leg pain  Hematological:  Bruises/bleeds easily.       Apixaban - though not currently taking  Psychiatric/Behavioral:  Positive for dysphoric mood. The patient is nervous/anxious.   All other systems reviewed and are negative.      Objective:   Physical Exam   PE: Constitution: Appropriate appearance for age. No apparent distress  Resp: No respiratory distress. No accessory muscle usage. on RA and CTAB Cardio: RRR. + edema 2+ LLE, 1+ RLE.  Brisk capillary refill all toes.  Palpable DP pulse on the L, not on the R.  Abdomen: Nondistended. Nontender.   Psych: Appropriate mood and affect. Neuro: AAOx4. No apparent cognitive deficits   Neurologic Exam:   DTRs: Reflexes were 2+ in bilateral achilles, patella, biceps, BR and triceps. Babinsky: flexor responses b/l.   Hoffmans: negative b/l Sensory  exam: revealed normal sensation in all dermatomal regions except BL stocking pattern neuropathy in feet  Motor exam:   Strength 4/5 bilateral hips; otherwise 4-/5 bl le  Coordination: Fine motor coordination was normal.   Gait: normal    MSK: Back  No apparent deformity.  + Pain with back extension, facet loading; no radicular symptoms + TTP R GTB; mild. No difficulty with abduction.       Assessment & Plan:    Jill Higgins is a 78 y.o. year old female  who  has a past medical history of Bronchitis, Bulging disc, Depression with anxiety, Diabetes mellitus, Fibromyalgia, GERD (gastroesophageal reflux disease), Glaucoma, History of bronchitis, Hyperlipidemia, Hypertension, Overactive bladder, Peripheral vascular disease (HCC), Pneumonia, Sleep apnea, Tobacco abuse, and Urinary frequency.   They are presenting to PM&R clinic as a new patient for treatment of right lower extremity pain, extending from thigh into foot, numbness/stabbing quality, worse with exertion; notable Hx multiple vascular procedures RLE.   Chronic pain syndrome Encounter for therapeutic drug monitoring Encounter for long-term use of opiate analgesic -     Drug Tox Alc Metab w/Con, Oral Fld -     Drug Tox Monitor 1 w/Conf, Oral Fld  Follow-up with my nurse practitioner Fidela in 1 month.  If pain is well-controlled on your current regimen, you will see her every other month and me every 6 months.  If not, we will follow-up more frequently.  Feel free to use MyChart between appointments to discuss any acute issues, making usually get back to within 48 hours.   I am increasing your tramadol  to 100 mg 2-3x daily; please try to keep to twice daily as much as possible.   Other chronic pancreatitis (HCC) We discussed hesitance with higher dose opiates given current weight loss and pancreatic issues; we can re-address this as needed.  Chronic bilateral low back pain with right-sided sciatica  Continue cymbalta   You  can stop gabapentin  as it has not been beneficial.  Since you already failed PT, we will get an MRI of your low back to look for possible nerve impingements.   Since you have had extreme blood sugar responses to steroid injections in the past, we may recommend low dose or non-steroid interventions if the findings show severe nerve pinching on the right.   Greater trochanteric bursitis of right hip Mild; no intervention at this time    05/25/34 knee/hip xray negative except for osteopenia.    Type 2 diabetes mellitus with vascular disease (HCC) PAD (peripheral artery disease) (HCC)  Continue following with Dr. Vonzell for vascular claudication.   Other orders -     traMADol  HCl; Take 2 tablets (100 mg total) by mouth 3 (three) times daily as needed.  Dispense: 90 tablet; Refill: 1  This visit note was generated with the assistance of an AI scribe. Patient was informed of the use of a scribe and consented to its use before recording. The AI system processes and transcribes spoken words into text, contributing to the creation of my medical records. The AI scribe will not be used to make any decisions about patient care. While the visit note has been reviewed for errors, please inform the office of any inaccuracies.

## 2024-07-01 DIAGNOSIS — R531 Weakness: Secondary | ICD-10-CM | POA: Diagnosis not present

## 2024-07-01 DIAGNOSIS — Z9181 History of falling: Secondary | ICD-10-CM | POA: Diagnosis not present

## 2024-07-03 LAB — DRUG TOX MONITOR 1 W/CONF, ORAL FLD
Alprazolam: NEGATIVE ng/mL (ref <0.50)
Aminoclonazepam: NEGATIVE ng/mL (ref <0.50)
Amphetamines: NEGATIVE ng/mL (ref <10)
Barbiturates: NEGATIVE ng/mL (ref <10)
Benzodiazepines: POSITIVE ng/mL — AB (ref <0.50)
Buprenorphine: NEGATIVE ng/mL (ref <0.10)
Chlordiazepoxide: NEGATIVE ng/mL (ref <0.50)
Clonazepam: NEGATIVE ng/mL (ref <0.50)
Cocaine: NEGATIVE ng/mL (ref <5.0)
Cotinine: >250 ng/mL — ABNORMAL HIGH (ref <5.0)
Diazepam: NEGATIVE ng/mL (ref <0.50)
Fentanyl: NEGATIVE ng/mL (ref <0.10)
Flunitrazepam: NEGATIVE ng/mL (ref <0.50)
Flurazepam: NEGATIVE ng/mL (ref <0.50)
Heroin Metabolite: NEGATIVE ng/mL (ref <1.0)
Lorazepam: NEGATIVE ng/mL (ref <0.50)
MARIJUANA: NEGATIVE ng/mL (ref <2.5)
MDMA: NEGATIVE ng/mL (ref <10)
Meprobamate: NEGATIVE ng/mL (ref <2.5)
Methadone: NEGATIVE ng/mL (ref <5.0)
Midazolam: NEGATIVE ng/mL (ref <0.50)
Nicotine Metabolite: POSITIVE ng/mL — AB (ref <5.0)
Nordiazepam: NEGATIVE ng/mL (ref <0.50)
Opiates: NEGATIVE ng/mL (ref <2.5)
Oxazepam: NEGATIVE ng/mL (ref <0.50)
Phencyclidine: NEGATIVE ng/mL (ref <10)
Tapentadol: NEGATIVE ng/mL (ref <5.0)
Temazepam: 1.79 ng/mL — ABNORMAL HIGH (ref <0.50)
Tramadol: >500 ng/mL — ABNORMAL HIGH (ref <5.0)
Tramadol: POSITIVE ng/mL — AB (ref <5.0)
Triazolam: NEGATIVE ng/mL (ref <0.50)
Zolpidem: NEGATIVE ng/mL (ref <5.0)

## 2024-07-03 LAB — DRUG TOX ALC METAB W/CON, ORAL FLD: Alcohol Metabolite: NEGATIVE ng/mL (ref <25)

## 2024-07-09 DIAGNOSIS — H401133 Primary open-angle glaucoma, bilateral, severe stage: Secondary | ICD-10-CM | POA: Diagnosis not present

## 2024-07-09 DIAGNOSIS — H35033 Hypertensive retinopathy, bilateral: Secondary | ICD-10-CM | POA: Diagnosis not present

## 2024-07-09 DIAGNOSIS — H47233 Glaucomatous optic atrophy, bilateral: Secondary | ICD-10-CM | POA: Diagnosis not present

## 2024-07-09 DIAGNOSIS — H53433 Sector or arcuate defects, bilateral: Secondary | ICD-10-CM | POA: Diagnosis not present

## 2024-07-13 ENCOUNTER — Ambulatory Visit
Admission: RE | Admit: 2024-07-13 | Discharge: 2024-07-13 | Disposition: A | Discharge status: Home / Self Care | Source: Ambulatory Visit | Attending: Physical Medicine and Rehabilitation | Admitting: Physical Medicine and Rehabilitation

## 2024-07-13 DIAGNOSIS — M4727 Other spondylosis with radiculopathy, lumbosacral region: Secondary | ICD-10-CM | POA: Diagnosis not present

## 2024-07-13 DIAGNOSIS — G8929 Other chronic pain: Secondary | ICD-10-CM

## 2024-07-13 DIAGNOSIS — M5117 Intervertebral disc disorders with radiculopathy, lumbosacral region: Secondary | ICD-10-CM | POA: Diagnosis not present

## 2024-07-15 ENCOUNTER — Encounter: Payer: Self-pay | Admitting: Physical Therapy

## 2024-07-15 ENCOUNTER — Ambulatory Visit: Attending: Family Medicine | Admitting: Physical Therapy

## 2024-07-15 VITALS — BP 129/75 | HR 62

## 2024-07-15 DIAGNOSIS — M6281 Muscle weakness (generalized): Secondary | ICD-10-CM | POA: Diagnosis not present

## 2024-07-15 DIAGNOSIS — R2681 Unsteadiness on feet: Secondary | ICD-10-CM | POA: Insufficient documentation

## 2024-07-15 DIAGNOSIS — Z9181 History of falling: Secondary | ICD-10-CM | POA: Diagnosis not present

## 2024-07-15 DIAGNOSIS — M79604 Pain in right leg: Secondary | ICD-10-CM | POA: Insufficient documentation

## 2024-07-15 NOTE — Therapy (Signed)
 OUTPATIENT PHYSICAL THERAPY NEURO EVALUATION   Patient Name: Jill Higgins MRN: [id number] DOB:June 03, 1946, 78 y.o., female Today's Date: 07/15/2024   PCP: Sharps Pellet, MD REFERRING PROVIDER: Sharps Pellet, MD  END OF SESSION:  PT End of Session - 07/15/24 1019     Visit Number 1    Number of Visits 17   Plus eval   Date for PT Re-Evaluation 09/18/24   Due to delay in scheduling   Authorization Type [employer] Medicare    PT Start Time 1017    PT Stop Time 1101    PT Time Calculation (min) 44 min    Activity Tolerance Patient tolerated treatment well    Behavior During Therapy WFL for tasks assessed/performed          Past Medical History:  Diagnosis Date   Bronchitis    Bulging disc    in neck   Depression with anxiety    Diabetes mellitus    Type II   Fibromyalgia    pt. denies   GERD (gastroesophageal reflux disease)    Glaucoma    History of bronchitis    Hyperlipidemia    Hypertension    Overactive bladder    Peripheral vascular disease (HCC)    Pneumonia    Sleep apnea    recent test negative for sleep apnea   Tobacco abuse    Urinary frequency    Past Surgical History:  Procedure Laterality Date   ABDOMINAL AORTAGRAM N/A 08/15/2013   Procedure: ABDOMINAL EZELLA;  Surgeon: Carlin FORBES Haddock, MD;  Location: [facility] CATH LAB;  Service: Cardiovascular;  Laterality: N/A;   ABDOMINAL AORTAGRAM N/A 06/19/2014   Procedure: ABDOMINAL EZELLA;  Surgeon: Carlin FORBES Haddock, MD;  Location: [facility] CATH LAB;  Service: Cardiovascular;  Laterality: N/A;   ABDOMINAL AORTOGRAM W/LOWER EXTREMITY N/A 05/10/2018   Procedure: ABDOMINAL AORTOGRAM W/LOWER EXTREMITY;  Surgeon: Haddock Carlin FORBES, MD;  Location: [facility] INVASIVE CV LAB;  Service: Cardiovascular;  Laterality: N/A;  bilateral   ABDOMINAL AORTOGRAM W/LOWER EXTREMITY N/A 04/20/2023   Procedure: ABDOMINAL AORTOGRAM W/LOWER EXTREMITY;  Surgeon: Magda Debby SAILOR, MD;  Location: [facility] INVASIVE CV LAB;  Service: Cardiovascular;  Laterality:  N/A;   Aortogram w/ PTA  05/14/08,  11-04-10   Bilateral aortogram w/ bilateral  SFA PTA  stenting    APPLICATION OF WOUND VAC Right 12/09/2023   Procedure: APPLICATION OF WOUND VAC;  Surgeon: Gretta Lonni PARAS, MD;  Location: [facility] OR;  Service: Vascular;  Laterality: Right;   AXILLARY-FEMORAL BYPASS GRAFT Right 11/15/2023   Procedure: RIGHT AXILLO-FEMORAL BYPASS GRAFT;  Surgeon: Magda Debby SAILOR, MD;  Location: [facility] OR;  Service: Vascular;  Laterality: Right;   AXILLARY-FEMORAL BYPASS GRAFT Right 12/09/2023   Procedure: EXCISION AXILLA-BIFEMORAL RIGHT;  Surgeon: Gretta Lonni PARAS, MD;  Location: [facility] OR;  Service: Vascular;  Laterality: Right;   BREAST SURGERY Right    boil removal   BUNIONECTOMY     L foot in the 1980s   COLONOSCOPY     2014   DILATION AND CURETTAGE OF UTERUS     X4   ENDARTERECTOMY FEMORAL Right 06/03/2018   Procedure: RIGHT FEMORAL ENDARTERECTOMY;  Surgeon: Haddock Carlin FORBES, MD;  Location: [facility] OR;  Service: Vascular;  Laterality: Right;   Epidural shots in neck      ESOPHAGOGASTRODUODENOSCOPY (EGD) WITH PROPOFOL  N/A 09/15/2019   Procedure: ESOPHAGOGASTRODUODENOSCOPY (EGD) WITH PROPOFOL ;  Surgeon: Burnette Fallow, MD;  Location: [facility] ENDOSCOPY;  Service: Endoscopy;  Laterality: N/A;   EUS N/A 09/15/2019  Procedure: UPPER ENDOSCOPIC ULTRASOUND (EUS) LINEAR;  Surgeon: Burnette Fallow, MD;  Location: [facility] ENDOSCOPY;  Service: Endoscopy;  Laterality: N/A;   EYE SURGERY Bilateral 2016   cataract removal   FEMORAL ARTERY EXPLORATION Right 12/09/2023   Procedure: Vein Patch Right FEMORAL ARTERY;  Surgeon: Gretta Lonni PARAS, MD;  Location: [facility] OR;  Service: Vascular;  Laterality: Right;   FEMORAL-POPLITEAL BYPASS GRAFT Left 10/21/2013   Procedure: LEFT FEMORAL-POPLITEAL ARTERY BYPASS WITH SAPHENOUS VEIN GRAFT , POPLITEAL ENDARTERECTOMY ,INTRAOPERATIVE ARTERIOGRAM, vein patch angioplasty to popliteal artery;  Surgeon: Carlin FORBES Haddock, MD;  Location: [facility] OR;  Service: Vascular;   Laterality: Left;   FEMORAL-POPLITEAL BYPASS GRAFT Right 02/08/2016   Procedure: Right FEMORAL- to Above Knee POPLITEAL ARTERY Bypass Graft with reversed saphenous vein and Common Femoral Endarterectomy  with profundoplasty;  Surgeon: Carlin FORBES Haddock, MD;  Location: [facility] OR;  Service: Vascular;  Laterality: Right;   FINE NEEDLE ASPIRATION N/A 09/15/2019   Procedure: FINE NEEDLE ASPIRATION (FNA) LINEAR;  Surgeon: Burnette Fallow, MD;  Location: [facility] ENDOSCOPY;  Service: Endoscopy;  Laterality: N/A;   GROIN DEBRIDEMENT Left 10/28/2013   Procedure: left inner thigh DEBRIDEMENT;  Surgeon: Carlin FORBES Haddock, MD;  Location: [facility] OR;  Service: Vascular;  Laterality: Left;   HAND SURGERY Left 2010   IR FLUORO GUIDE CV LINE RIGHT  12/14/2023   IR REMOVAL TUN CV CATH W/O FL  01/04/2024   IR US  GUIDE VASC ACCESS RIGHT  12/14/2023   LOWER EXTREMITY ANGIOGRAM Bilateral 12/24/2015   Procedure: Lower Extremity Angiogram;  Surgeon: Carlin FORBES Haddock, MD;  Location: [facility] INVASIVE CV LAB;  Service: Cardiovascular;  Laterality: Bilateral;   PATCH ANGIOPLASTY Right 06/03/2018   Procedure: PATCH ANGIOPLASTY USING A XENOSURE 1CM X 14CM BIOLOGIC PATCH;  Surgeon: Haddock Carlin FORBES, MD;  Location: [facility] OR;  Service: Vascular;  Laterality: Right;   PERIPHERAL VASCULAR CATHETERIZATION N/A 12/24/2015   Procedure: Abdominal Aortogram;  Surgeon: Carlin FORBES Haddock, MD;  Location: [facility] INVASIVE CV LAB;  Service: Cardiovascular;  Laterality: N/A;   PERIPHERAL VASCULAR CATHETERIZATION Left 12/24/2015   Procedure: Peripheral Vascular Balloon Angioplasty;  Surgeon: Carlin FORBES Haddock, MD;  Location: [facility] INVASIVE CV LAB;  Service: Cardiovascular;  Laterality: Left;  drug coated balloon   PERIPHERAL VASCULAR CATHETERIZATION N/A 06/30/2016   Procedure: Abdominal Aortogram;  Surgeon: Carlin FORBES Haddock, MD;  Location: [place] INVASIVE CV LAB;  Service: Cardiovascular;  Laterality: N/A;   PERIPHERAL VASCULAR CATHETERIZATION Bilateral 06/30/2016   Procedure: Lower  Extremity Angiography;  Surgeon: Carlin FORBES Haddock, MD;  Location: [facility] INVASIVE CV LAB;  Service: Cardiovascular;  Laterality: Bilateral;   TONSILLECTOMY     TRANSESOPHAGEAL ECHOCARDIOGRAM (CATH LAB) N/A 12/13/2023   Procedure: TRANSESOPHAGEAL ECHOCARDIOGRAM;  Surgeon: Raford Riggs, MD;  Location: [facility] INVASIVE CV LAB;  Service: Cardiovascular;  Laterality: N/A;   VEIN HARVEST Left 12/09/2023   Procedure: BACILIC VEIN HARVEST LEFT ARM;  Surgeon: Gretta Lonni PARAS, MD;  Location: [facility] OR;  Service: Vascular;  Laterality: Left;   VEIN REPAIR Right 12/09/2023   Procedure: VEIN  PATCH RIGHT AXILLIA ARTERY RIGHT;  Surgeon: Gretta Lonni PARAS, MD;  Location: [facility] OR;  Service: Vascular;  Laterality: Right;   Patient Active Problem List   Diagnosis Date Noted   Chronic pain syndrome 06/30/2024   Chronic bilateral low back pain with right-sided sciatica 06/30/2024   Greater trochanteric bursitis of right hip 06/30/2024   Bacteremia 12/13/2023   Protein-calorie malnutrition, severe 12/11/2023   Vascular graft infection, initial encounter (HCC) 12/07/2023   Sepsis (  HCC) 12/07/2023   Peripheral arterial disease (HCC) 04/20/2023   Pancreatitis 01/25/2022   Chronic pancreatitis (HCC) 09/02/2019   Type 2 diabetes mellitus with vascular disease (HCC) 09/02/2019   Diabetes (HCC) 05/23/2018   Other spondylosis with radiculopathy, cervical region 03/05/2018   Foraminal stenosis of cervical region 09/11/2017   Partial tear of right rotator cuff 04/04/2017   Aftercare following surgery of the circulatory system 02/23/2016   S/P femoropopliteal bypass surgery 02/23/2016   PAD (peripheral artery disease) (HCC) 02/08/2016   Leg edema, left 12/18/2013   Aftercare following surgery of the circulatory system, NEC 11/19/2013   Ischemic leg 10/21/2013   Peripheral vascular disease, unspecified (HCC) 09/18/2013   Preop cardiovascular exam 09/08/2013   Hypertension    Hyperlipidemia    Peripheral vascular  disease (HCC)    Fibromyalgia    GERD (gastroesophageal reflux disease)    Depression with anxiety    Pain in limb 04/03/2013   Atherosclerosis of native artery of extremity with intermittent claudication (HCC) 08/01/2012   Claudication (HCC) 12/28/2011    ONSET DATE: 07/01/2024 (referral)   REFERRING DIAG: R53.1 (ICD-10-CM) - Weakness  THERAPY DIAG:  History of falling  Muscle weakness (generalized)  Unsteadiness on feet  Pain in right leg  Rationale for Evaluation and Treatment: Rehabilitation  SUBJECTIVE:                                                                                                                                                                                             SUBJECTIVE STATEMENT: Pt presents alone w/SPC. States she is having pain in her RLE, states this has been going on since a surgery in December. Reports she has had 4-5 falls this year, most recent one being on [birth year] (her [birth year]). Was walking from her car to her apartment and she fell to the L side on the ground. All falls occur to her L side. If she falls outside, she needs help to get up. If she falls inside, she can crawl somewhere to get up.   Was in the hospital for several months due to leg surgery and was DC in January and then went to SNF for a few months. Once she returned home, received HHPT and HHOT until April of this year. Spends most of her day lying down to alleviate pressure on her RLE.   Saw pain management last week and had an MRI of her lumbar spine, but results have not been read.   Blood glucose at 171 mg/dL currently.    Pt accompanied by: self  PERTINENT HISTORY: Right common femoral endarterectomy with right femoral to above-knee  popliteal bypass, stenting of the right iliac and common femoral arteries, bronchitis, bulging disc, depression w/anxiety, DM II, fibromyalgia, GERD, glaucoma, hyperlipidemia, HTN, PVD, and sleep apnea.  PAIN:  Are you having pain?  Yes: NPRS scale: 4-5/10 Pain location: RLE  Pain description: throbbing Aggravating factors: Walking Relieving factors: Voltaren   PRECAUTIONS: Fall  RED FLAGS: None   WEIGHT BEARING RESTRICTIONS: No  FALLS: Has patient fallen in last 6 months? Yes. Number of falls 4-5  LIVING ENVIRONMENT: Lives with: lives alone Lives in: House/apartment Stairs: No Has following equipment at home: Single point cane, Walker - 4 wheeled, shower chair, and Grab bars  PLOF: Requires assistive device for independence and Needs assistance with homemaking  PATIENT GOALS: Balance  OBJECTIVE:  Note: Objective measures were completed at Evaluation unless otherwise noted.  DIAGNOSTIC FINDINGS: MRI of lumbar spine performed on 07/13/24 and has not been resulted    COGNITION: Overall cognitive status: Within functional limits for tasks assessed   SENSATION: Pt reports a tight feeling in BLEs and numbness in BLEs   EDEMA: Pt reports chronic swelling in BLEs     POSTURE: rounded shoulders and increased thoracic kyphosis  LOWER EXTREMITY ROM:     Active  Right Eval Left Eval  Hip flexion    Hip extension    Hip abduction    Hip adduction    Hip internal rotation    Hip external rotation    Knee flexion    Knee extension    Ankle dorsiflexion    Ankle plantarflexion    Ankle inversion    Ankle eversion     (Blank rows = not tested)  LOWER EXTREMITY MMT:  Tested in seated position   MMT Right Eval Left Eval  Hip flexion 3 3-  Hip extension    Hip abduction 4- 4-  Hip adduction 4- 4-  Hip internal rotation    Hip external rotation    Knee flexion 4 4  Knee extension 4 4  Ankle dorsiflexion 4 4  Ankle plantarflexion    Ankle inversion    Ankle eversion    (Blank rows = not tested)  BED MOBILITY:  Not tested Pt reports independence w/this, sleeps on the sofa sometimes but mostly in her bed   TRANSFERS: Sit to stand: SBA  Assistive device utilized: Single point cane      Stand to sit: SBA  Assistive device utilized: Single point cane     Decreased anterior weight shift, lateral shift to L side, heavy reliance on BUE support w/posterior lean in stance   RAMP:  Not tested  CURB:  Not tested  STAIRS: Not tested GAIT: Gait pattern: step through pattern, decreased stride length, decreased hip/knee flexion- Right, decreased hip/knee flexion- Left, lateral hip instability, lateral lean- Left, decreased trunk rotation, and narrow BOS Distance walked: Various clinic distances  Assistive device utilized: Single point cane Level of assistance: CGA and Min A Comments: Pt required min A to stabilize w/immediate standing balance due to lean to L side. Noted compensated trendelenburg to L side as well.    FUNCTIONAL TESTS:   [facility] PT Assessment - 07/15/24 1045       Transfers   Five time sit to stand comments  53.41s   BUE support     Ambulation/Gait   Gait velocity 32.8' over 42.34s = 0.77 ft/s w/SPC   CGA, high fall risk          PATIENT SURVEYS:  ABC scale to be assessed  VITALS Vitals:   07/15/24 1035  BP: 129/75  Pulse: 62                                                                                                                              TREATMENT :   Self-care/home management  Assessed vitals (see above) and WNL Educated pt on need to use [facility] or rollator at all times, both indoors and outdoors, due to high fall risk. Pt reports she tends to furniture walk but will try to use AD inside.   PATIENT EDUCATION: Education details: POC, eval findings, see self-care above  Person educated: Patient Education method: Explanation Education comprehension: verbalized understanding and needs further education  HOME EXERCISE PROGRAM: To be established   GOALS: Goals reviewed with patient? Yes  SHORT TERM GOALS: Target date: 08/12/2024    Pt will be independent with initial HEP for improved strength, balance, transfers and  gait.  Baseline: not established on eval  Goal status: INITIAL  2.  ABC scale to be assessed and LTG updated  Baseline:  Goal status: INITIAL  3.  Pt will improve gait velocity to at least 1.0 ft/s w/LRAD and SBA for improved gait efficiency and independence   Baseline: 0.77 ft/s w/SPC and CGA Goal status: INITIAL  4.  Pt will improve 5 x STS to less than or equal to 45 seconds to demonstrate improved functional strength and transfer efficiency.   Baseline: 53.41s w/BUE support  Goal status: INITIAL  5.  Pt will perform floor transfer w/min A for improved fall recovery and functional BLE strength  Baseline:  Goal status: INITIAL   LONG TERM GOALS: Target date: 09/09/2024   Pt will be independent with final HEP for improved strength, balance, transfers and gait.  Baseline:  Goal status: INITIAL  2.  Pt will improve gait velocity to at least 1.4 ft/s w/LRAD mod I for improved gait efficiency and reduced fall risk   Baseline: 0.77 ft/s w/SPC and CGA Goal status: INITIAL  3.  Pt will improve 5 x STS to less than or equal to 38 seconds to demonstrate improved functional strength and transfer efficiency.  Baseline: 53.41s w/BUE support Goal status: INITIAL  4.  ABC scale goal  Baseline:  Goal status: INITIAL  5.  Pt will perform floor transfer w/CGA for improved fall recovery and functional BLE strength  Baseline:  Goal status: INITIAL   ASSESSMENT:  CLINICAL IMPRESSION: Patient is a 78 year old female referred to Neuro OPPT for weakness. Pt's PMH is significant for: right common femoral endarterectomy with right femoral to above-knee popliteal bypass, stenting of the right iliac and common femoral arteries, bronchitis, bulging disc, depression w/anxiety, DM II, fibromyalgia, GERD, glaucoma, hyperlipidemia, HTN, PVD, and sleep apnea. The following deficits were present during the exam: decreased functional strength, impaired gait kinematics, high pain levels in RLE,  impaired sensation in BLEs and global deconditioning. Based on falls history and gait speed, pt is  an incr risk for falls. Pt would benefit from skilled PT to address these impairments and functional limitations to maximize functional mobility independence.    OBJECTIVE IMPAIRMENTS: Abnormal gait, decreased activity tolerance, decreased balance, decreased endurance, decreased knowledge of use of DME, decreased mobility, decreased ROM, decreased strength, impaired sensation, improper body mechanics, and pain  ACTIVITY LIMITATIONS: carrying, lifting, bending, standing, squatting, stairs, transfers, locomotion level, and caring for others  PARTICIPATION LIMITATIONS: meal prep, cleaning, laundry, driving, shopping, community activity, and yard work  PERSONAL FACTORS: Fitness, Past/current experiences, and 1-2 comorbidities: PVD in RLE are also affecting patient's functional outcome.   REHAB POTENTIAL: Good  CLINICAL DECISION MAKING: Evolving/moderate complexity  EVALUATION COMPLEXITY: Moderate  PLAN:  PT FREQUENCY: 2x/week  PT DURATION: 8 weeks  PLANNED INTERVENTIONS: [id number]- PT Re-evaluation, 97750- Physical Performance Testing, 97110-Therapeutic exercises, 97530- Therapeutic activity, [id number]- Neuromuscular re-education, 97535- Self Care, [id number]- Manual therapy, [id number]- Gait training, [phone]- Aquatic Therapy, [phone]- Electrical stimulation (manual), [phone] (1-2 muscles), [id number] (3+ muscles)- Dry Needling, Patient/Family education, Balance training, Stair training, Joint mobilization, Spinal mobilization, Vestibular training, and DME instructions  PLAN FOR NEXT SESSION: Assess ABC and update goal. Use Scifit as warmup. Work on functional BLE strength - start supine and increase difficulty. L hip abduction/extension strength, standing balance, dynamic balance, floor transfers (imitate being outside)    Annastyn Silvey E Danyka Merlin, PT, DPT 07/15/2024, 11:18 AM

## 2024-07-17 DIAGNOSIS — H53433 Sector or arcuate defects, bilateral: Secondary | ICD-10-CM | POA: Diagnosis not present

## 2024-07-17 DIAGNOSIS — K8689 Other specified diseases of pancreas: Secondary | ICD-10-CM | POA: Diagnosis not present

## 2024-07-17 DIAGNOSIS — H35033 Hypertensive retinopathy, bilateral: Secondary | ICD-10-CM | POA: Diagnosis not present

## 2024-07-17 DIAGNOSIS — Z9049 Acquired absence of other specified parts of digestive tract: Secondary | ICD-10-CM | POA: Diagnosis not present

## 2024-07-17 DIAGNOSIS — H47233 Glaucomatous optic atrophy, bilateral: Secondary | ICD-10-CM | POA: Diagnosis not present

## 2024-07-17 DIAGNOSIS — Z90410 Acquired total absence of pancreas: Secondary | ICD-10-CM | POA: Diagnosis not present

## 2024-07-17 DIAGNOSIS — H401133 Primary open-angle glaucoma, bilateral, severe stage: Secondary | ICD-10-CM | POA: Diagnosis not present

## 2024-07-20 DIAGNOSIS — E7800 Pure hypercholesterolemia, unspecified: Secondary | ICD-10-CM | POA: Diagnosis not present

## 2024-07-20 DIAGNOSIS — E1165 Type 2 diabetes mellitus with hyperglycemia: Secondary | ICD-10-CM | POA: Diagnosis not present

## 2024-07-20 DIAGNOSIS — E1169 Type 2 diabetes mellitus with other specified complication: Secondary | ICD-10-CM | POA: Diagnosis not present

## 2024-07-20 DIAGNOSIS — H409 Unspecified glaucoma: Secondary | ICD-10-CM | POA: Diagnosis not present

## 2024-07-24 ENCOUNTER — Ambulatory Visit: Admitting: Podiatry

## 2024-07-28 ENCOUNTER — Encounter: Attending: Registered Nurse | Admitting: Registered Nurse

## 2024-07-28 ENCOUNTER — Encounter: Payer: Self-pay | Admitting: Registered Nurse

## 2024-07-28 VITALS — BP 117/75 | HR 68 | Temp 98.2°F | Resp 16 | Ht 63.0 in | Wt 101.4 lb

## 2024-07-28 DIAGNOSIS — Z79891 Long term (current) use of opiate analgesic: Secondary | ICD-10-CM | POA: Diagnosis not present

## 2024-07-28 DIAGNOSIS — G5793 Unspecified mononeuropathy of bilateral lower limbs: Secondary | ICD-10-CM | POA: Diagnosis not present

## 2024-07-28 DIAGNOSIS — Z5181 Encounter for therapeutic drug level monitoring: Secondary | ICD-10-CM | POA: Insufficient documentation

## 2024-07-28 DIAGNOSIS — G894 Chronic pain syndrome: Secondary | ICD-10-CM | POA: Diagnosis not present

## 2024-07-28 DIAGNOSIS — M25511 Pain in right shoulder: Secondary | ICD-10-CM | POA: Insufficient documentation

## 2024-07-28 DIAGNOSIS — G8929 Other chronic pain: Secondary | ICD-10-CM | POA: Insufficient documentation

## 2024-07-28 NOTE — Progress Notes (Signed)
 Subjective:    Patient ID: Jill Higgins, female    DOB: 02-Jun-1946, 78 y.o.   MRN: [id number]  HPI: Jill Higgins is a 78 y.o. female who returns for follow up appointment for chronic pain and medication refill. She states her pain is located in her right shoulder, and lower extremities with tingling. She rates her pain 4. Her current exercise regime is walking and performing stretching exercises.  Jill Higgins Morphine  equivalent is 40.00 MME.   Last Oral Swab was Performed on 06/30/2024,it was consistent.      Pain Inventory Average Pain 8 Pain Right Now 4 My pain is intermittent, constant, dull, stabbing, tingling, and aching  In the last 24 hours, has pain interfered with the following? General activity 4 Relation with others 0 Enjoyment of life 5 What TIME of day is your pain at its worst? morning , daytime, evening, and night Sleep (in general) Fair  Pain is worse with: walking, bending, inactivity, standing, and some activites Pain improves with: heat/ice, therapy/exercise, and injections Relief from Meds: 3  Family History  Problem Relation Age of Onset   Heart disease Father    Heart attack Father        MI at age 1   Hypertension Mother    Alzheimer's disease Mother    Hypertension Sister    Diabetes Brother    Hyperlipidemia Brother    Hypertension Brother    Heart disease Brother    Heart attack Brother    Breast cancer Maternal Aunt    Social History   Socioeconomic History   Marital status: Single    Spouse name: Not on file   Number of children: 1   Years of education: Not on file   Highest education level: Not on file  Occupational History   Occupation: Retired    Associate Professor: Scientist, water quality  Tobacco Use   Smoking status: Former    Current packs/day: 0.25    Average packs/day: 0.3 packs/day for 1.1 years (0.3 ttl pk-yrs)    Types: Cigarettes    Start date: 06/2023    Quit date: 10/2014    Passive exposure: Never   Smokeless tobacco: Never   Tobacco  comments:    pt states that she is using the E cig only   Vaping Use   Vaping status: Some Days  Substance and Sexual Activity   Alcohol use: Yes    Alcohol/week: 0.0 - 1.0 standard drinks of alcohol    Comment: Occasional; 10-22 rarely    Drug use: No   Sexual activity: Not on file  Other Topics Concern   Not on file  Social History Narrative   Drinks about 2 cups of coffee a day, and some tea    Social Drivers of Corporate investment banker Strain: Not on file  Food Insecurity: No Food Insecurity (12/11/2023)   Hunger Vital Sign    Worried About Running Out of Food in the Last Year: Never true    Ran Out of Food in the Last Year: Never true  Transportation Needs: No Transportation Needs (12/11/2023)   PRAPARE - Administrator, Civil Service (Medical): No    Lack of Transportation (Non-Medical): No  Physical Activity: Not on file  Stress: Not on file  Social Connections: Moderately Isolated (12/11/2023)   Social Connection and Isolation Panel    Frequency of Communication with Friends and Family: More than three times a week    Frequency of Social Gatherings with  Friends and Family: More than three times a week    Attends Religious Services: 1 to 4 times per year    Active Member of Clubs or Organizations: No    Attends Banker Meetings: Never    Marital Status: Divorced   Past Surgical History:  Procedure Laterality Date   ABDOMINAL AORTAGRAM N/A 08/15/2013   Procedure: ABDOMINAL EZELLA;  Surgeon: Carlin FORBES Haddock, MD;  Location: [facility] CATH LAB;  Service: Cardiovascular;  Laterality: N/A;   ABDOMINAL AORTAGRAM N/A 06/19/2014   Procedure: ABDOMINAL EZELLA;  Surgeon: Carlin FORBES Haddock, MD;  Location: [facility] CATH LAB;  Service: Cardiovascular;  Laterality: N/A;   ABDOMINAL AORTOGRAM W/LOWER EXTREMITY N/A 05/10/2018   Procedure: ABDOMINAL AORTOGRAM W/LOWER EXTREMITY;  Surgeon: Haddock Carlin FORBES, MD;  Location: [facility] INVASIVE CV LAB;  Service: Cardiovascular;   Laterality: N/A;  bilateral   ABDOMINAL AORTOGRAM W/LOWER EXTREMITY N/A 04/20/2023   Procedure: ABDOMINAL AORTOGRAM W/LOWER EXTREMITY;  Surgeon: Magda Debby SAILOR, MD;  Location: [facility] INVASIVE CV LAB;  Service: Cardiovascular;  Laterality: N/A;   Aortogram w/ PTA  05/14/08,  11-04-10   Bilateral aortogram w/ bilateral  SFA PTA  stenting    APPLICATION OF WOUND VAC Right 12/09/2023   Procedure: APPLICATION OF WOUND VAC;  Surgeon: Gretta Lonni PARAS, MD;  Location: [facility] OR;  Service: Vascular;  Laterality: Right;   AXILLARY-FEMORAL BYPASS GRAFT Right 11/15/2023   Procedure: RIGHT AXILLO-FEMORAL BYPASS GRAFT;  Surgeon: Magda Debby SAILOR, MD;  Location: [facility] OR;  Service: Vascular;  Laterality: Right;   AXILLARY-FEMORAL BYPASS GRAFT Right 12/09/2023   Procedure: EXCISION AXILLA-BIFEMORAL RIGHT;  Surgeon: Gretta Lonni PARAS, MD;  Location: [facility] OR;  Service: Vascular;  Laterality: Right;   BREAST SURGERY Right    boil removal   BUNIONECTOMY     L foot in the 1980s   COLONOSCOPY     2014   DILATION AND CURETTAGE OF UTERUS     X4   ENDARTERECTOMY FEMORAL Right 06/03/2018   Procedure: RIGHT FEMORAL ENDARTERECTOMY;  Surgeon: Haddock Carlin FORBES, MD;  Location: [facility] OR;  Service: Vascular;  Laterality: Right;   Epidural shots in neck      ESOPHAGOGASTRODUODENOSCOPY (EGD) WITH PROPOFOL  N/A 09/15/2019   Procedure: ESOPHAGOGASTRODUODENOSCOPY (EGD) WITH PROPOFOL ;  Surgeon: Burnette Fallow, MD;  Location: [facility] ENDOSCOPY;  Service: Endoscopy;  Laterality: N/A;   EUS N/A 09/15/2019   Procedure: UPPER ENDOSCOPIC ULTRASOUND (EUS) LINEAR;  Surgeon: Burnette Fallow, MD;  Location: [facility] ENDOSCOPY;  Service: Endoscopy;  Laterality: N/A;   EYE SURGERY Bilateral 2016   cataract removal   FEMORAL ARTERY EXPLORATION Right 12/09/2023   Procedure: Vein Patch Right FEMORAL ARTERY;  Surgeon: Gretta Lonni PARAS, MD;  Location: [facility] OR;  Service: Vascular;  Laterality: Right;   FEMORAL-POPLITEAL BYPASS GRAFT Left 10/21/2013   Procedure:  LEFT FEMORAL-POPLITEAL ARTERY BYPASS WITH SAPHENOUS VEIN GRAFT , POPLITEAL ENDARTERECTOMY ,INTRAOPERATIVE ARTERIOGRAM, vein patch angioplasty to popliteal artery;  Surgeon: Carlin FORBES Haddock, MD;  Location: [facility] OR;  Service: Vascular;  Laterality: Left;   FEMORAL-POPLITEAL BYPASS GRAFT Right 02/08/2016   Procedure: Right FEMORAL- to Above Knee POPLITEAL ARTERY Bypass Graft with reversed saphenous vein and Common Femoral Endarterectomy  with profundoplasty;  Surgeon: Carlin FORBES Haddock, MD;  Location: [facility] OR;  Service: Vascular;  Laterality: Right;   FINE NEEDLE ASPIRATION N/A 09/15/2019   Procedure: FINE NEEDLE ASPIRATION (FNA) LINEAR;  Surgeon: Burnette Fallow, MD;  Location: [facility]OS[facility];  Service: Endoscopy;  Laterality: N/A;   GROIN DEBRIDEMENT Left 10/28/2013   Procedure:  left inner thigh DEBRIDEMENT;  Surgeon: Carlin FORBES Haddock, MD;  Location: [facility] OR;  Service: Vascular;  Laterality: Left;   HAND SURGERY Left 2010   IR FLUORO GUIDE CV LINE RIGHT  12/14/2023   IR REMOVAL TUN CV CATH W/O FL  01/04/2024   IR US  GUIDE VASC ACCESS RIGHT  12/14/2023   LOWER EXTREMITY ANGIOGRAM Bilateral 12/24/2015   Procedure: Lower Extremity Angiogram;  Surgeon: Carlin FORBES Haddock, MD;  Location: [facility] INVASIVE CV LAB;  Service: Cardiovascular;  Laterality: Bilateral;   PATCH ANGIOPLASTY Right 06/03/2018   Procedure: PATCH ANGIOPLASTY USING A XENOSURE 1CM X 14CM BIOLOGIC PATCH;  Surgeon: Haddock Carlin FORBES, MD;  Location: [facility] OR;  Service: Vascular;  Laterality: Right;   PERIPHERAL VASCULAR CATHETERIZATION N/A 12/24/2015   Procedure: Abdominal Aortogram;  Surgeon: Carlin FORBES Haddock, MD;  Location: [facility] INVASIVE CV LAB;  Service: Cardiovascular;  Laterality: N/A;   PERIPHERAL VASCULAR CATHETERIZATION Left 12/24/2015   Procedure: Peripheral Vascular Balloon Angioplasty;  Surgeon: Carlin FORBES Haddock, MD;  Location: [facility] INVASIVE CV LAB;  Service: Cardiovascular;  Laterality: Left;  drug coated balloon   PERIPHERAL VASCULAR CATHETERIZATION N/A  06/30/2016   Procedure: Abdominal Aortogram;  Surgeon: Carlin FORBES Haddock, MD;  Location: [facility] INVASIVE CV LAB;  Service: Cardiovascular;  Laterality: N/A;   PERIPHERAL VASCULAR CATHETERIZATION Bilateral 06/30/2016   Procedure: Lower Extremity Angiography;  Surgeon: Carlin FORBES Haddock, MD;  Location: [facility] INVASIVE CV LAB;  Service: Cardiovascular;  Laterality: Bilateral;   TONSILLECTOMY     TRANSESOPHAGEAL ECHOCARDIOGRAM (CATH LAB) N/A 12/13/2023   Procedure: TRANSESOPHAGEAL ECHOCARDIOGRAM;  Surgeon: Raford Riggs, MD;  Location: [facility] INVASIVE CV LAB;  Service: Cardiovascular;  Laterality: N/A;   VEIN HARVEST Left 12/09/2023   Procedure: BACILIC VEIN HARVEST LEFT ARM;  Surgeon: Gretta Lonni PARAS, MD;  Location: [facility] OR;  Service: Vascular;  Laterality: Left;   VEIN REPAIR Right 12/09/2023   Procedure: VEIN  PATCH RIGHT AXILLIA ARTERY RIGHT;  Surgeon: Gretta Lonni PARAS, MD;  Location: [facility] OR;  Service: Vascular;  Laterality: Right;   Past Surgical History:  Procedure Laterality Date   ABDOMINAL AORTAGRAM N/A 08/15/2013   Procedure: ABDOMINAL EZELLA;  Surgeon: Carlin FORBES Haddock, MD;  Location: [facility] CATH LAB;  Service: Cardiovascular;  Laterality: N/A;   ABDOMINAL AORTAGRAM N/A 06/19/2014   Procedure: ABDOMINAL EZELLA;  Surgeon: Carlin FORBES Haddock, MD;  Location: [facility] CATH LAB;  Service: Cardiovascular;  Laterality: N/A;   ABDOMINAL AORTOGRAM W/LOWER EXTREMITY N/A 05/10/2018   Procedure: ABDOMINAL AORTOGRAM W/LOWER EXTREMITY;  Surgeon: Haddock Carlin FORBES, MD;  Location: [facility] INVASIVE CV LAB;  Service: Cardiovascular;  Laterality: N/A;  bilateral   ABDOMINAL AORTOGRAM W/LOWER EXTREMITY N/A 04/20/2023   Procedure: ABDOMINAL AORTOGRAM W/LOWER EXTREMITY;  Surgeon: Magda Debby SAILOR, MD;  Location: [facility] INVASIVE CV LAB;  Service: Cardiovascular;  Laterality: N/A;   Aortogram w/ PTA  05/14/08,  11-04-10   Bilateral aortogram w/ bilateral  SFA PTA  stenting    APPLICATION OF WOUND VAC Right 12/09/2023   Procedure:  APPLICATION OF WOUND VAC;  Surgeon: Gretta Lonni PARAS, MD;  Location: [facility] OR;  Service: Vascular;  Laterality: Right;   AXILLARY-FEMORAL BYPASS GRAFT Right 11/15/2023   Procedure: RIGHT AXILLO-FEMORAL BYPASS GRAFT;  Surgeon: Magda Debby SAILOR, MD;  Location: [facility] OR;  Service: Vascular;  Laterality: Right;   AXILLARY-FEMORAL BYPASS GRAFT Right 12/09/2023   Procedure: EXCISION AXILLA-BIFEMORAL RIGHT;  Surgeon: Gretta Lonni PARAS, MD;  Location: [facility] OR;  Service: Vascular;  Laterality: Right;   BREAST SURGERY Right  boil removal   BUNIONECTOMY     L foot in the 1980s   COLONOSCOPY     2014   DILATION AND CURETTAGE OF UTERUS     X4   ENDARTERECTOMY FEMORAL Right 06/03/2018   Procedure: RIGHT FEMORAL ENDARTERECTOMY;  Surgeon: Harvey Carlin BRAVO, MD;  Location: [facility] OR;  Service: Vascular;  Laterality: Right;   Epidural shots in neck      ESOPHAGOGASTRODUODENOSCOPY (EGD) WITH PROPOFOL  N/A 09/15/2019   Procedure: ESOPHAGOGASTRODUODENOSCOPY (EGD) WITH PROPOFOL ;  Surgeon: Burnette Fallow, MD;  Location[facility];  Service: Endoscopy;  Laterality: N/A;   EUS N/A 09/15/2019   Procedure: UPPER ENDOSCOPIC ULTRASOUND (EUS) LINEAR;  Surgeon: Burnette Fallow, MD;  Location: [facility];  Service: Endoscopy;  Laterality: N/A;   EYE SURGERY Bilateral 2016   cataract removal   FEMORAL ARTERY EXPLORATION Right 12/09/2023   Procedure: Vein Patch Right FEMORAL ARTERY;  Surgeon: Gretta Lonni PARAS, MD;  Location: [facility] OR;  Service: Vascular;  Laterality: Right;   FEMORAL-POPLITEAL BYPASS GRAFT Left 10/21/2013   Procedure: LEFT FEMORAL-POPLITEAL ARTERY BYPASS WITH SAPHENOUS VEIN GRAFT , POPLITEAL ENDARTERECTOMY ,INTRAOPERATIVE ARTERIOGRAM, vein patch angioplasty to popliteal artery;  Surgeon: Carlin BRAVO Harvey, MD;  Location: [facility] OR;  Service: Vascular;  Laterality: Left;   FEMORAL-POPLITEAL BYPASS GRAFT Right 02/08/2016   Procedure: Right FEMORAL- to Above Knee POPLITEAL ARTERY Bypass Graft with reversed saphenous  vein and Common Femoral Endarterectomy  with profundoplasty;  Surgeon: Carlin BRAVO Harvey, MD;  Location: [facility] OR;  Service: Vascular;  Laterality: Right;   FINE NEEDLE ASPIRATION N/A 09/15/2019   Procedure: FINE NEEDLE ASPIRATION (FNA) LINEAR;  Surgeon: Burnette Fallow, MD;  Location: [facility];  Service: Endoscopy;  Laterality: N/A;   GROIN DEBRIDEMENT Left 10/28/2013   Procedure: left inner thigh DEBRIDEMENT;  Surgeon: Carlin BRAVO Harvey, MD;  Location: [facility] OR;  Service: Vascular;  Laterality: Left;   HAND SURGERY Left 2010   IR FLUORO GUIDE CV LINE RIGHT  12/14/2023   IR REMOVAL TUN CV CATH W/O FL  01/04/2024   IR US  GUIDE VASC ACCESS RIGHT  12/14/2023   LOWER EXTREMITY ANGIOGRAM Bilateral 12/24/2015   Procedure: Lower Extremity Angiogram;  Surgeon: Carlin BRAVO Harvey, MD;  Location: [facility] INVASIVE CV LAB;  Service: Cardiovascular;  Laterality: Bilateral;   PATCH ANGIOPLASTY Right 06/03/2018   Procedure: PATCH ANGIOPLASTY USING A XENOSURE 1CM X 14CM BIOLOGIC PATCH;  Surgeon: Harvey Carlin BRAVO, MD;  Location: [facility] OR;  Service: Vascular;  Laterality: Right;   PERIPHERAL VASCULAR CATHETERIZATION N/A 12/24/2015   Procedure: Abdominal Aortogram;  Surgeon: Carlin BRAVO Harvey, MD;  Location: [facility] INVASIVE CV LAB;  Service: Cardiovascular;  Laterality: N/A;   PERIPHERAL VASCULAR CATHETERIZATION Left 12/24/2015   Procedure: Peripheral Vascular Balloon Angioplasty;  Surgeon: Carlin BRAVO Harvey, MD;  Location: [facility] INVASIVE CV LAB;  Service: Cardiovascular;  Laterality: Left;  drug coated balloon   PERIPHERAL VASCULAR CATHETERIZATION N/A 06/30/2016   Procedure: Abdominal Aortogram;  Surgeon: Carlin BRAVO Harvey, MD;  Location: [facility] INVASIVE CV LAB;  Service: Cardiovascular;  Laterality: N/A;   PERIPHERAL VASCULAR CATHETERIZATION Bilateral 06/30/2016   Procedure: Lower Extremity Angiography;  Surgeon: Carlin BRAVO Harvey, MD;  Location: [facility] INVASIVE CV LAB;  Service: Cardiovascular;  Laterality: Bilateral;   TONSILLECTOMY     TRANSESOPHAGEAL  ECHOCARDIOGRAM (CATH LAB) N/A 12/13/2023   Procedure: TRANSESOPHAGEAL ECHOCARDIOGRAM;  Surgeon: Raford Riggs, MD;  Location: [facility] INVASIVE CV LAB;  Service: Cardiovascular;  Laterality: N/A;   VEIN HARVEST Left 12/09/2023   Procedure: BACILIC VEIN HARVEST LEFT  ARM;  Surgeon: Gretta Lonni PARAS, MD;  Location: [facility] OR;  Service: Vascular;  Laterality: Left;   VEIN REPAIR Right 12/09/2023   Procedure: VEIN  PATCH RIGHT AXILLIA ARTERY RIGHT;  Surgeon: Gretta Lonni PARAS, MD;  Location: [facility] OR;  Service: Vascular;  Laterality: Right;   Past Medical History:  Diagnosis Date   Bronchitis    Bulging disc    in neck   Depression with anxiety    Diabetes mellitus    Type II   Fibromyalgia    pt. denies   GERD (gastroesophageal reflux disease)    Glaucoma    History of bronchitis    Hyperlipidemia    Hypertension    Overactive bladder    Peripheral vascular disease (HCC)    Pneumonia    Sleep apnea    recent test negative for sleep apnea   Tobacco abuse    Urinary frequency    There were no vitals taken for this visit.  Opioid Risk Score:   Fall Risk Score:  `1  Depression screen [facility] 2/9     06/30/2024    1:27 PM 11/11/2020    3:39 PM  Depression screen PHQ 2/9  Decreased Interest 1 0  Down, Depressed, Hopeless 1 0  PHQ - 2 Score 2 0  Altered sleeping 3   Tired, decreased energy 3   Change in appetite 2   Feeling bad or failure about yourself  0   Trouble concentrating 0   Moving slowly or fidgety/restless 2   Suicidal thoughts 0   PHQ-9 Score 12   Difficult doing work/chores Extremely dIfficult     Review of Systems  Musculoskeletal:  Positive for back pain, gait problem and neck pain.       Pain in both legs, right shoulder, both arms & legs  All other systems reviewed and are negative.      Objective:   Physical Exam Vitals and nursing note reviewed.  Constitutional:      Appearance: Normal appearance.  Cardiovascular:     Rate and Rhythm: Normal rate and  regular rhythm.     Pulses: Normal pulses.     Heart sounds: Normal heart sounds.  Pulmonary:     Effort: Pulmonary effort is normal.     Breath sounds: Normal breath sounds.  Skin:    General: Skin is warm and dry.  Neurological:     Mental Status: She is alert and oriented to person, place, and time.  Psychiatric:        Mood and Affect: Mood normal.        Behavior: Behavior normal.           Assessment & Plan:  Chronic Right Shoulder Pain: Continue Tramadol  as prescribed. Call office with update. We will continue the opioid monitoring program, this consists of regular clinic visits, examinations, urine drug screen, pill counts as well as use of [place]  Controlled Substance Reporting system. A 12 month History has been reviewed on the [place]  Controlled Substance Reporting System on 07/28/2024  Neuropathy of Bilateral Lower Extremities: Continue current medication regimen. Continue to monitor.   F/U in 3 months

## 2024-07-28 NOTE — Patient Instructions (Addendum)
 Take Tramadol  as prescribed: Call office in two weeks with update.    [phone]   Fidela

## 2024-07-30 ENCOUNTER — Ambulatory Visit: Admitting: Physical Therapy

## 2024-07-31 ENCOUNTER — Ambulatory Visit: Admitting: Physical Therapy

## 2024-07-31 VITALS — BP 161/100 | HR 79

## 2024-07-31 DIAGNOSIS — R2689 Other abnormalities of gait and mobility: Secondary | ICD-10-CM | POA: Insufficient documentation

## 2024-07-31 DIAGNOSIS — R2681 Unsteadiness on feet: Secondary | ICD-10-CM

## 2024-07-31 DIAGNOSIS — M6281 Muscle weakness (generalized): Secondary | ICD-10-CM | POA: Insufficient documentation

## 2024-07-31 NOTE — Therapy (Signed)
 Patient Name: Jill Higgins MRN: [id number] DOB:Oct 23, 1946, 78 y.o., female Today's Date: 07/31/2024   PT End of Session - 07/31/24 1537     Visit Number 1   Arrived no charge   Number of Visits 17   Plus eval   Date for PT Re-Evaluation 09/18/24   Due to delay in scheduling   Authorization Type [employer] Medicare    PT Start Time 1533    PT Stop Time 1542   Arrived no charge   PT Time Calculation (min) 9 min    Activity Tolerance Other (comment)   Nausea, HTN   Behavior During Therapy [facility] for tasks assessed/performed         Pt received from lobby, slumped over and lethargic. Escorted pt back to treatment room and pt reports she is having a very bad day. Pt reporting nausea and low blood glucose, but drank some juice prior to session. Pt reports she did not take her medications this morning. Assessed BP (see below) and pt hypertensive. Pt reports she drove herself to clinic and will be fine driving home, declined therapist calling a family member to pick her up. Pt's blood glucose at 125 mg/dL. Pt provided w/emesis bag and small can of [employer] and escorted out of clinic. Pt able to ambulate mod I and stated she would go home and take her medications. Pt reminded of next appointment date and time.   Vitals:   07/31/24 1538  BP: (!) 161/100  Pulse: 79      Venice Marcucci E Soma Bachand, PT, DPT 07/31/2024, 3:49 PM

## 2024-08-01 ENCOUNTER — Ambulatory Visit: Admitting: Physical Therapy

## 2024-08-01 DIAGNOSIS — I959 Hypotension, unspecified: Secondary | ICD-10-CM | POA: Diagnosis not present

## 2024-08-01 DIAGNOSIS — Z8719 Personal history of other diseases of the digestive system: Secondary | ICD-10-CM | POA: Diagnosis not present

## 2024-08-01 DIAGNOSIS — Z9049 Acquired absence of other specified parts of digestive tract: Secondary | ICD-10-CM | POA: Diagnosis not present

## 2024-08-01 DIAGNOSIS — Z90411 Acquired partial absence of pancreas: Secondary | ICD-10-CM | POA: Diagnosis not present

## 2024-08-01 DIAGNOSIS — K8689 Other specified diseases of pancreas: Secondary | ICD-10-CM | POA: Diagnosis not present

## 2024-08-01 DIAGNOSIS — K529 Noninfective gastroenteritis and colitis, unspecified: Secondary | ICD-10-CM | POA: Diagnosis not present

## 2024-08-01 DIAGNOSIS — R531 Weakness: Secondary | ICD-10-CM | POA: Diagnosis not present

## 2024-08-01 DIAGNOSIS — E1151 Type 2 diabetes mellitus with diabetic peripheral angiopathy without gangrene: Secondary | ICD-10-CM | POA: Diagnosis not present

## 2024-08-01 DIAGNOSIS — I7090 Unspecified atherosclerosis: Secondary | ICD-10-CM | POA: Diagnosis not present

## 2024-08-01 DIAGNOSIS — Z794 Long term (current) use of insulin: Secondary | ICD-10-CM | POA: Diagnosis not present

## 2024-08-01 DIAGNOSIS — K8681 Exocrine pancreatic insufficiency: Secondary | ICD-10-CM | POA: Diagnosis not present

## 2024-08-01 DIAGNOSIS — N3289 Other specified disorders of bladder: Secondary | ICD-10-CM | POA: Diagnosis not present

## 2024-08-02 DIAGNOSIS — K8681 Exocrine pancreatic insufficiency: Secondary | ICD-10-CM | POA: Diagnosis not present

## 2024-08-02 DIAGNOSIS — K529 Noninfective gastroenteritis and colitis, unspecified: Secondary | ICD-10-CM | POA: Diagnosis not present

## 2024-08-02 DIAGNOSIS — I959 Hypotension, unspecified: Secondary | ICD-10-CM | POA: Diagnosis not present

## 2024-08-02 DIAGNOSIS — Z136 Encounter for screening for cardiovascular disorders: Secondary | ICD-10-CM | POA: Diagnosis not present

## 2024-08-04 NOTE — Telephone Encounter (Signed)
 Phone call to pt to review CT results.  She is on her way home and prefers to discuss them tomorrow. Will call back tomorrow afternoon to discuss.

## 2024-08-05 ENCOUNTER — Ambulatory Visit: Admitting: Physical Therapy

## 2024-08-05 NOTE — Telephone Encounter (Signed)
 Updated Ms. Boggus of yesterday's phone call with Dr. Magda. She has already heard from his office and has an appt in November.    She is picking up Creon  from the pharmacy tomorrow and will start it.  Education reinforced on administration.    Discussed CEA elevation. She thinks her last colonoscopy was 2 years ago, no recall recommended due to age.  Advised colonoscopy given CEA elevation and she is going to call her GI office to schedule.  We will also obtain CT chest.   Will try to r/o malignancy as cause of CEA elevation. Recognize that cardiometabolic diseases may also cause elevation.    Follow-up in 2 months to confirm creon  utilization and follow-up CT chest and colonoscopy completion.

## 2024-08-05 NOTE — Therapy (Incomplete)
 OUTPATIENT PHYSICAL THERAPY NEURO TREATMENT   Patient Name: Jill Higgins MRN: [id number] DOB:1946/11/02, 78 y.o., female Today's Date: 08/05/2024   PCP: Sharps Pellet, MD REFERRING PROVIDER: Sharps Pellet, MD  END OF SESSION:    Past Medical History:  Diagnosis Date   Bronchitis    Bulging disc    in neck   Depression with anxiety    Diabetes mellitus    Type II   Fibromyalgia    pt. denies   GERD (gastroesophageal reflux disease)    Glaucoma    History of bronchitis    Hyperlipidemia    Hypertension    Overactive bladder    Peripheral vascular disease (HCC)    Pneumonia    Sleep apnea    recent test negative for sleep apnea   Tobacco abuse    Urinary frequency    Past Surgical History:  Procedure Laterality Date   ABDOMINAL AORTAGRAM N/A 08/15/2013   Procedure: ABDOMINAL EZELLA;  Surgeon: Carlin FORBES Haddock, MD;  Location: [facility] CATH LAB;  Service: Cardiovascular;  Laterality: N/A;   ABDOMINAL AORTAGRAM N/A 06/19/2014   Procedure: ABDOMINAL EZELLA;  Surgeon: Carlin FORBES Haddock, MD;  Location: [facility] CATH LAB;  Service: Cardiovascular;  Laterality: N/A;   ABDOMINAL AORTOGRAM W/LOWER EXTREMITY N/A 05/10/2018   Procedure: ABDOMINAL AORTOGRAM W/LOWER EXTREMITY;  Surgeon: Haddock Carlin FORBES, MD;  Location: [facility] INVASIVE CV LAB;  Service: Cardiovascular;  Laterality: N/A;  bilateral   ABDOMINAL AORTOGRAM W/LOWER EXTREMITY N/A 04/20/2023   Procedure: ABDOMINAL AORTOGRAM W/LOWER EXTREMITY;  Surgeon: Magda Debby SAILOR, MD;  Location: [facility] INVASIVE CV LAB;  Service: Cardiovascular;  Laterality: N/A;   Aortogram w/ PTA  05/14/08,  11-04-10   Bilateral aortogram w/ bilateral  SFA PTA  stenting    APPLICATION OF WOUND VAC Right 12/09/2023   Procedure: APPLICATION OF WOUND VAC;  Surgeon: Gretta Lonni PARAS, MD;  Location: [facility] OR;  Service: Vascular;  Laterality: Right;   AXILLARY-FEMORAL BYPASS GRAFT Right 11/15/2023   Procedure: RIGHT AXILLO-FEMORAL BYPASS GRAFT;  Surgeon: Magda Debby SAILOR, MD;  Location: [facility] OR;  Service: Vascular;  Laterality: Right;   AXILLARY-FEMORAL BYPASS GRAFT Right 12/09/2023   Procedure: EXCISION AXILLA-BIFEMORAL RIGHT;  Surgeon: Gretta Lonni PARAS, MD;  Location: [facility] OR;  Service: Vascular;  Laterality: Right;   BREAST SURGERY Right    boil removal   BUNIONECTOMY     L foot in the 1980s   COLONOSCOPY     2014   DILATION AND CURETTAGE OF UTERUS     X4   ENDARTERECTOMY FEMORAL Right 06/03/2018   Procedure: RIGHT FEMORAL ENDARTERECTOMY;  Surgeon: Haddock Carlin FORBES, MD;  Location: [facility] OR;  Service: Vascular;  Laterality: Right;   Epidural shots in neck      ESOPHAGOGASTRODUODENOSCOPY (EGD) WITH PROPOFOL  N/A 09/15/2019   Procedure: ESOPHAGOGASTRODUODENOSCOPY (EGD) WITH PROPOFOL ;  Surgeon: Burnette Fallow, MD;  Location: [facility] ENDOSCOPY;  Service: Endoscopy;  Laterality: N/A;   EUS N/A 09/15/2019   Procedure: UPPER ENDOSCOPIC ULTRASOUND (EUS) LINEAR;  Surgeon: Burnette Fallow, MD;  Location: [facility] ENDOSCOPY;  Service: Endoscopy;  Laterality: N/A;   EYE SURGERY Bilateral 2016   cataract removal   FEMORAL ARTERY EXPLORATION Right 12/09/2023   Procedure: Vein Patch Right FEMORAL ARTERY;  Surgeon: Gretta Lonni PARAS, MD;  Location: [facility] OR;  Service: Vascular;  Laterality: Right;   FEMORAL-POPLITEAL BYPASS GRAFT Left 10/21/2013   Procedure: LEFT FEMORAL-POPLITEAL ARTERY BYPASS WITH SAPHENOUS VEIN GRAFT , POPLITEAL ENDARTERECTOMY ,INTRAOPERATIVE ARTERIOGRAM, vein patch angioplasty to popliteal artery;  Surgeon: Carlin  FORBES Haddock, MD;  Location: [facility] OR;  Service: Vascular;  Laterality: Left;   FEMORAL-POPLITEAL BYPASS GRAFT Right 02/08/2016   Procedure: Right FEMORAL- to Above Knee POPLITEAL ARTERY Bypass Graft with reversed saphenous vein and Common Femoral Endarterectomy  with profundoplasty;  Surgeon: Carlin FORBES Haddock, MD;  Location: [facility] OR;  Service: Vascular;  Laterality: Right;   FINE NEEDLE ASPIRATION N/A 09/15/2019   Procedure: FINE NEEDLE ASPIRATION (FNA)  LINEAR;  Surgeon: Burnette Fallow, MD;  Location: [facility] ENDOSCOPY;  Service: Endoscopy;  Laterality: N/A;   GROIN DEBRIDEMENT Left 10/28/2013   Procedure: left inner thigh DEBRIDEMENT;  Surgeon: Carlin FORBES Haddock, MD;  Location: [facility] OR;  Service: Vascular;  Laterality: Left;   HAND SURGERY Left 2010   IR FLUORO GUIDE CV LINE RIGHT  12/14/2023   IR REMOVAL TUN CV CATH W/O FL  01/04/2024   IR US  GUIDE VASC ACCESS RIGHT  12/14/2023   LOWER EXTREMITY ANGIOGRAM Bilateral 12/24/2015   Procedure: Lower Extremity Angiogram;  Surgeon: Carlin FORBES Haddock, MD;  Location: [facility] INVASIVE CV LAB;  Service: Cardiovascular;  Laterality: Bilateral;   PATCH ANGIOPLASTY Right 06/03/2018   Procedure: PATCH ANGIOPLASTY USING A XENOSURE 1CM X 14CM BIOLOGIC PATCH;  Surgeon: Haddock Carlin FORBES, MD;  Location: [facility] OR;  Service: Vascular;  Laterality: Right;   PERIPHERAL VASCULAR CATHETERIZATION N/A 12/24/2015   Procedure: Abdominal Aortogram;  Surgeon: Carlin FORBES Haddock, MD;  Location: [facility] INVASIVE CV LAB;  Service: Cardiovascular;  Laterality: N/A;   PERIPHERAL VASCULAR CATHETERIZATION Left 12/24/2015   Procedure: Peripheral Vascular Balloon Angioplasty;  Surgeon: Carlin FORBES Haddock, MD;  Location: [facility] INVASIVE CV LAB;  Service: Cardiovascular;  Laterality: Left;  drug coated balloon   PERIPHERAL VASCULAR CATHETERIZATION N/A 06/30/2016   Procedure: Abdominal Aortogram;  Surgeon: Carlin FORBES Haddock, MD;  Location: [facility] INVASIVE CV LAB;  Service: Cardiovascular;  Laterality: N/A;   PERIPHERAL VASCULAR CATHETERIZATION Bilateral 06/30/2016   Procedure: Lower Extremity Angiography;  Surgeon: Carlin FORBES Haddock, MD;  Location: [facility] INVASIVE CV LAB;  Service: Cardiovascular;  Laterality: Bilateral;   TONSILLECTOMY     TRANSESOPHAGEAL ECHOCARDIOGRAM (CATH LAB) N/A 12/13/2023   Procedure: TRANSESOPHAGEAL ECHOCARDIOGRAM;  Surgeon: Raford Riggs, MD;  Location: [facility] INVASIVE CV LAB;  Service: Cardiovascular;  Laterality: N/A;   VEIN HARVEST Left 12/09/2023    Procedure: BACILIC VEIN HARVEST LEFT ARM;  Surgeon: Gretta Lonni PARAS, MD;  Location: [facility] OR;  Service: Vascular;  Laterality: Left;   VEIN REPAIR Right 12/09/2023   Procedure: VEIN  PATCH RIGHT AXILLIA ARTERY RIGHT;  Surgeon: Gretta Lonni PARAS, MD;  Location: [facility] OR;  Service: Vascular;  Laterality: Right;   Patient Active Problem List   Diagnosis Date Noted   Chronic pain syndrome 06/30/2024   Chronic bilateral low back pain with right-sided sciatica 06/30/2024   Greater trochanteric bursitis of right hip 06/30/2024   Bacteremia 12/13/2023   Protein-calorie malnutrition, severe 12/11/2023   Vascular graft infection, initial encounter (HCC) 12/07/2023   Sepsis (HCC) 12/07/2023   Peripheral arterial disease (HCC) 04/20/2023   Pancreatitis 01/25/2022   Chronic pancreatitis (HCC) 09/02/2019   Type 2 diabetes mellitus with vascular disease (HCC) 09/02/2019   Diabetes (HCC) 05/23/2018   Other spondylosis with radiculopathy, cervical region 03/05/2018   Foraminal stenosis of cervical region 09/11/2017   Partial tear of right rotator cuff 04/04/2017   Aftercare following surgery of the circulatory system 02/23/2016   S/P femoropopliteal bypass surgery 02/23/2016   PAD (peripheral artery disease) (HCC) 02/08/2016   Leg edema, left 12/18/2013  Aftercare following surgery of the circulatory system, NEC 11/19/2013   Ischemic leg 10/21/2013   Peripheral vascular disease, unspecified (HCC) 09/18/2013   Preop cardiovascular exam 09/08/2013   Hypertension    Hyperlipidemia    Peripheral vascular disease (HCC)    Fibromyalgia    GERD (gastroesophageal reflux disease)    Depression with anxiety    Pain in limb 04/03/2013   Atherosclerosis of native artery of extremity with intermittent claudication (HCC) 08/01/2012   Claudication (HCC) 12/28/2011    ONSET DATE: 07/01/2024 (referral)   REFERRING DIAG: R53.1 (ICD-10-CM) - Weakness  THERAPY DIAG:  No diagnosis found.  Rationale for  Evaluation and Treatment: Rehabilitation  SUBJECTIVE:                                                                                                                                                                                             SUBJECTIVE STATEMENT: Pt presents alone w/SPC. States she is having pain in her RLE, states this has been going on since a surgery in December. Reports she has had 4-5 falls this year, most recent one being on [birth year] (her [birth year]). Was walking from her car to her apartment and she fell to the L side on the ground. All falls occur to her L side. If she falls outside, she needs help to get up. If she falls inside, she can crawl somewhere to get up.   Was in the hospital for several months due to leg surgery and was DC in January and then went to SNF for a few months. Once she returned home, received HHPT and HHOT until April of this year. Spends most of her day lying down to alleviate pressure on her RLE.   Saw pain management last week and had an MRI of her lumbar spine, but results have not been read.   Blood glucose at 171 mg/dL currently.    ***   Pt accompanied by: self  PERTINENT HISTORY: Right common femoral endarterectomy with right femoral to above-knee popliteal bypass, stenting of the right iliac and common femoral arteries, bronchitis, bulging disc, depression w/anxiety, DM II, fibromyalgia, GERD, glaucoma, hyperlipidemia, HTN, PVD, and sleep apnea.  PAIN:  Are you having pain? Yes: NPRS scale: 4-5/10 Pain location: RLE  Pain description: throbbing Aggravating factors: Walking Relieving factors: Voltaren   PRECAUTIONS: Fall  RED FLAGS: None   WEIGHT BEARING RESTRICTIONS: No  FALLS: Has patient fallen in last 6 months? Yes. Number of falls 4-5  LIVING ENVIRONMENT: Lives with: lives alone Lives in: House/apartment Stairs: No Has following equipment at home: Single point cane, Walker - 4 wheeled, shower chair,  and Grab  bars  PLOF: Requires assistive device for independence and Needs assistance with homemaking  PATIENT GOALS: Balance  OBJECTIVE:  Note: Objective measures were completed at Evaluation unless otherwise noted.  DIAGNOSTIC FINDINGS: MRI of lumbar spine performed on 07/13/24 and has not been resulted    COGNITION: Overall cognitive status: Within functional limits for tasks assessed   SENSATION: Pt reports a tight feeling in BLEs and numbness in BLEs   EDEMA: Pt reports chronic swelling in BLEs     POSTURE: rounded shoulders and increased thoracic kyphosis  LOWER EXTREMITY ROM:     Active  Right Eval Left Eval  Hip flexion    Hip extension    Hip abduction    Hip adduction    Hip internal rotation    Hip external rotation    Knee flexion    Knee extension    Ankle dorsiflexion    Ankle plantarflexion    Ankle inversion    Ankle eversion     (Blank rows = not tested)  LOWER EXTREMITY MMT:  Tested in seated position   MMT Right Eval Left Eval  Hip flexion 3 3-  Hip extension    Hip abduction 4- 4-  Hip adduction 4- 4-  Hip internal rotation    Hip external rotation    Knee flexion 4 4  Knee extension 4 4  Ankle dorsiflexion 4 4  Ankle plantarflexion    Ankle inversion    Ankle eversion    (Blank rows = not tested)  BED MOBILITY:  Not tested Pt reports independence w/this, sleeps on the sofa sometimes but mostly in her bed   TRANSFERS: Sit to stand: SBA  Assistive device utilized: Single point cane     Stand to sit: SBA  Assistive device utilized: Single point cane     Decreased anterior weight shift, lateral shift to L side, heavy reliance on BUE support w/posterior lean in stance   RAMP:  Not tested  CURB:  Not tested  STAIRS: Not tested GAIT: Gait pattern: step through pattern, decreased stride length, decreased hip/knee flexion- Right, decreased hip/knee flexion- Left, lateral hip instability, lateral lean- Left, decreased trunk rotation,  and narrow BOS Distance walked: Various clinic distances  Assistive device utilized: Single point cane Level of assistance: CGA and Min A Comments: Pt required min A to stabilize w/immediate standing balance due to lean to L side. Noted compensated trendelenburg to L side as well.    FUNCTIONAL TESTS:      PATIENT SURVEYS:  ABC scale to be assessed   VITALS There were no vitals filed for this visit.                                                                                                                             TREATMENT :   Self-care/home management  Assessed vitals (see above) and WNL Educated pt on need to use [facility] or rollator at all times, both  indoors and outdoors, due to high fall risk. Pt reports she tends to furniture walk but will try to use AD inside.  ***  PATIENT EDUCATION: Education details: POC, eval findings, see self-care above *** Person educated: Patient Education method: Explanation Education comprehension: verbalized understanding and needs further education  HOME EXERCISE PROGRAM: To be established   GOALS: Goals reviewed with patient? Yes  SHORT TERM GOALS: Target date: 08/12/2024    Pt will be independent with initial HEP for improved strength, balance, transfers and gait.  Baseline: not established on eval  Goal status: INITIAL  2.  ABC scale to be assessed and LTG updated  Baseline:  Goal status: INITIAL  3.  Pt will improve gait velocity to at least 1.0 ft/s w/LRAD and SBA for improved gait efficiency and independence   Baseline: 0.77 ft/s w/SPC and CGA Goal status: INITIAL  4.  Pt will improve 5 x STS to less than or equal to 45 seconds to demonstrate improved functional strength and transfer efficiency.   Baseline: 53.41s w/BUE support  Goal status: INITIAL  5.  Pt will perform floor transfer w/min A for improved fall recovery and functional BLE strength  Baseline:  Goal status: INITIAL   LONG TERM GOALS: Target  date: 09/09/2024   Pt will be independent with final HEP for improved strength, balance, transfers and gait.  Baseline:  Goal status: INITIAL  2.  Pt will improve gait velocity to at least 1.4 ft/s w/LRAD mod I for improved gait efficiency and reduced fall risk   Baseline: 0.77 ft/s w/SPC and CGA Goal status: INITIAL  3.  Pt will improve 5 x STS to less than or equal to 38 seconds to demonstrate improved functional strength and transfer efficiency.  Baseline: 53.41s w/BUE support Goal status: INITIAL  4.  ABC scale goal  Baseline:  Goal status: INITIAL  5.  Pt will perform floor transfer w/CGA for improved fall recovery and functional BLE strength  Baseline:  Goal status: INITIAL   ASSESSMENT:  CLINICAL IMPRESSION: Emphasis of skilled PT session*** Continue POC.    OBJECTIVE IMPAIRMENTS: Abnormal gait, decreased activity tolerance, decreased balance, decreased endurance, decreased knowledge of use of DME, decreased mobility, decreased ROM, decreased strength, impaired sensation, improper body mechanics, and pain  ACTIVITY LIMITATIONS: carrying, lifting, bending, standing, squatting, stairs, transfers, locomotion level, and caring for others  PARTICIPATION LIMITATIONS: meal prep, cleaning, laundry, driving, shopping, community activity, and yard work  PERSONAL FACTORS: Fitness, Past/current experiences, and 1-2 comorbidities: PVD in RLE are also affecting patient's functional outcome.   REHAB POTENTIAL: Good  CLINICAL DECISION MAKING: Evolving/moderate complexity  EVALUATION COMPLEXITY: Moderate  PLAN:  PT FREQUENCY: 2x/week  PT DURATION: 8 weeks  PLANNED INTERVENTIONS: [id number]- PT Re-evaluation, 97750- Physical Performance Testing, 97110-Therapeutic exercises, 97530- Therapeutic activity, [id number]- Neuromuscular re-education, 97535- Self Care, [id number]- Manual therapy, [id number]- Gait training, [phone]- Aquatic Therapy, [phone]- Electrical stimulation (manual), [phone] (1-2  muscles), [id number] (3+ muscles)- Dry Needling, Patient/Family education, Balance training, Stair training, Joint mobilization, Spinal mobilization, Vestibular training, and DME instructions  PLAN FOR NEXT SESSION: Assess ABC and update goal. Use Scifit as warmup. Work on functional BLE strength - start supine and increase difficulty. L hip abduction/extension strength, standing balance, dynamic balance, floor transfers (imitate being outside) ***   Waddell Southgate, PT Waddell Southgate, PT, DPT, CSRS  08/05/2024, 9:52 AM

## 2024-08-07 ENCOUNTER — Ambulatory Visit: Admitting: Internal Medicine

## 2024-08-07 ENCOUNTER — Ambulatory Visit: Attending: Family Medicine | Admitting: Physical Therapy

## 2024-08-07 ENCOUNTER — Encounter: Payer: Self-pay | Admitting: Internal Medicine

## 2024-08-07 VITALS — BP 120/70 | HR 69 | Ht 63.0 in | Wt 99.4 lb

## 2024-08-07 DIAGNOSIS — J449 Chronic obstructive pulmonary disease, unspecified: Secondary | ICD-10-CM | POA: Diagnosis not present

## 2024-08-07 DIAGNOSIS — E1151 Type 2 diabetes mellitus with diabetic peripheral angiopathy without gangrene: Secondary | ICD-10-CM

## 2024-08-07 DIAGNOSIS — Z23 Encounter for immunization: Secondary | ICD-10-CM | POA: Diagnosis not present

## 2024-08-07 DIAGNOSIS — I10 Essential (primary) hypertension: Secondary | ICD-10-CM | POA: Diagnosis not present

## 2024-08-07 DIAGNOSIS — E1169 Type 2 diabetes mellitus with other specified complication: Secondary | ICD-10-CM | POA: Diagnosis not present

## 2024-08-07 DIAGNOSIS — M6281 Muscle weakness (generalized): Secondary | ICD-10-CM

## 2024-08-07 DIAGNOSIS — R110 Nausea: Secondary | ICD-10-CM | POA: Diagnosis not present

## 2024-08-07 DIAGNOSIS — R2681 Unsteadiness on feet: Secondary | ICD-10-CM

## 2024-08-07 DIAGNOSIS — E7800 Pure hypercholesterolemia, unspecified: Secondary | ICD-10-CM

## 2024-08-07 DIAGNOSIS — F17200 Nicotine dependence, unspecified, uncomplicated: Secondary | ICD-10-CM | POA: Diagnosis not present

## 2024-08-07 DIAGNOSIS — R2689 Other abnormalities of gait and mobility: Secondary | ICD-10-CM | POA: Diagnosis not present

## 2024-08-07 DIAGNOSIS — I739 Peripheral vascular disease, unspecified: Secondary | ICD-10-CM | POA: Diagnosis not present

## 2024-08-07 DIAGNOSIS — N3281 Overactive bladder: Secondary | ICD-10-CM | POA: Diagnosis not present

## 2024-08-07 DIAGNOSIS — K219 Gastro-esophageal reflux disease without esophagitis: Secondary | ICD-10-CM | POA: Diagnosis not present

## 2024-08-07 LAB — POCT GLYCOSYLATED HEMOGLOBIN (HGB A1C): Hemoglobin A1C: 6.3 % — AB (ref 4.0–5.6)

## 2024-08-07 NOTE — Progress Notes (Signed)
 Patient ID: Jill Higgins, female   DOB: 09/13/1946, 78 y.o.   MRN: [id number]  HPI: ADRYANNA FRIEDT is a 78 y.o.-year-old female, returning for follow-up for DM2, dx in 2013, insulin -dependent, uncontrolled, with complications (CAD, PAD). Pt. previously saw Dr. Kassie, but last visit with me 3 mo ago.  Interim history: No increased urination,  blurry vision. She has nausea, which started few weeks ago >> lost weight.  She was recently in the hospital - a CEA was elevated. Colonoscopy coming up.  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  Reviewed HbA1c: Lab Results  Component Value Date   HGBA1C 7.4 (A) 05/05/2024   HGBA1C 6.1 (A) 01/24/2024   HGBA1C 9.5 (A) 09/27/2023   HGBA1C 7.3 (A) 05/10/2023   HGBA1C 5.6 12/18/2022   HGBA1C 7.0 (A) 08/17/2022   HGBA1C 7.0 (A) 04/20/2022   HGBA1C 6.3 (A) 01/23/2022   HGBA1C 6.8 (A) 10/24/2021   HGBA1C 7.5 (A) 07/18/2021  06/26/2022: HbA1c 8.5%   She was previously on: - Metformin  ER 1000 >> 500 mg 2x a day, with meals  - NovoLog  4-8 units 2x a day >> Novolog  2-6 units 2x a day 15 min before the meal If the sugars are high between meals, you can correct with NovoLog : >200: + 1 unit >300: + 2 units >400 + 3 units She had previous hypoglycemia with glipizide. She was on repaglinide  until we started NovoLog .  Currently on:  - Metformin  ER 500 mg 2x a day, with meals >> stopped 05/2024 - Lantus  8-10 units at night  - added 09/2023 >> 20 >> 16 >> 12 >> 10  units daily >> STOPPED 01/2024 - FiAsp  >> Novolog  4-8 >> 6-10 units 2-3x a day before the meal >> 2 to 4 units depending on the meal size >> 2-3 units before large meals only  -stopped due to low blood sugars  Pt checks her sugars >4x a day with the Libre 2 CGM - PDM:  Prev.:  Prev.:   Lowest blood sugars: 49  >> ...70 >> 27!!! >> 55 >> 65; she has hypoglycemia awareness at 70.  In 12/2023, her sugars dropped to 27 (she lost consciousness) per EMS.  Afterwards, her doses of insulin  were reduced.  Highest sugar was 300s >> 200 >> high 300s >> 306 >> 390.  Glucometer: Accu-Chek guide  Pt's meals - 2x a day.  - no CKD, BUN/creatinine: Lab Results  Component Value Date   BUN 7 (L) 05/28/2024   BUN 9 12/19/2023   CREATININE 0.83 05/28/2024   CREATININE 0.99 12/19/2023   Lab Results  Component Value Date   MICRALBCREAT 5 05/05/2024  She is not on ACE inhibitor/ARB.  -+ HL; last set of lipids: 02/04/2024: 105/87/60/27 Lab Results  Component Value Date   CHOL 48 12/10/2023   HDL 20 (L) 12/10/2023   LDLCALC 15 12/10/2023   TRIG 65 12/10/2023   CHOLHDL 2.4 12/10/2023  On Mevacor 10 mg daily  - last eye exam was in 2024. No DR reportedly. + Glaucoma. Dr. Milissa.  - + numbness and tingling in her feet.  Last foot exam 06/20/2024 by Dr. Lamount.  On Gabapentin .   She has a history of HTN, overactive bladder, depression/anxiety, anemia-on iron 325 mg daily. She had Whipple procedure (for what turned out to be a benign tumor) in  10/2019 and then after hospitalization for pancreatitis 06/2022.  In 2024, she had axillo-femoral bypass graft  - she had a lot of pain.  She was also admitted with bacteremia after surgery.  She was in rehab afterwards.  On Pletal .  ROS: + see HPI  Past Medical History:  Diagnosis Date   Bronchitis    Bulging disc    in neck   Depression with anxiety    Diabetes mellitus    Type II   Fibromyalgia    pt. denies   GERD (gastroesophageal reflux disease)    Glaucoma    History of bronchitis    Hyperlipidemia    Hypertension    Overactive bladder    Peripheral vascular disease (HCC)    Pneumonia    Sleep apnea    recent test negative for sleep apnea   Tobacco abuse    Urinary frequency    Past Surgical History:  Procedure Laterality Date   ABDOMINAL  AORTAGRAM N/A 08/15/2013   Procedure: ABDOMINAL EZELLA;  Surgeon: Carlin FORBES Haddock, MD;  Location: [facility] CATH LAB;  Service: Cardiovascular;  Laterality: N/A;   ABDOMINAL AORTAGRAM N/A 06/19/2014   Procedure: ABDOMINAL EZELLA;  Surgeon: Carlin FORBES Haddock, MD;  Location: [facility] CATH LAB;  Service: Cardiovascular;  Laterality: N/A;   ABDOMINAL AORTOGRAM W/LOWER EXTREMITY N/A 05/10/2018   Procedure: ABDOMINAL AORTOGRAM W/LOWER EXTREMITY;  Surgeon: Haddock Carlin FORBES, MD;  Location: [facility] INVASIVE CV LAB;  Service: Cardiovascular;  Laterality: N/A;  bilateral   ABDOMINAL AORTOGRAM W/LOWER EXTREMITY N/A 04/20/2023   Procedure: ABDOMINAL AORTOGRAM W/LOWER EXTREMITY;  Surgeon: Magda Debby SAILOR, MD;  Location: [facility] INVASIVE CV LAB;  Service: Cardiovascular;  Laterality: N/A;   Aortogram w/ PTA  05/14/08,  11-04-10   Bilateral aortogram w/ bilateral  SFA PTA  stenting    APPLICATION OF WOUND VAC Right 12/09/2023   Procedure: APPLICATION OF WOUND VAC;  Surgeon: Gretta Lonni PARAS, MD;  Location: [facility] OR;  Service: Vascular;  Laterality: Right;   AXILLARY-FEMORAL BYPASS GRAFT Right 11/15/2023   Procedure: RIGHT AXILLO-FEMORAL BYPASS GRAFT;  Surgeon: Magda Debby SAILOR, MD;  Location: [facility] OR;  Service: Vascular;  Laterality: Right;   AXILLARY-FEMORAL BYPASS GRAFT Right 12/09/2023   Procedure: EXCISION AXILLA-BIFEMORAL RIGHT;  Surgeon: Gretta Lonni PARAS, MD;  Location: [facility] OR;  Service: Vascular;  Laterality: Right;   BREAST SURGERY Right    boil removal   BUNIONECTOMY     L foot in the 1980s   COLONOSCOPY     2014   DILATION AND CURETTAGE OF UTERUS     X4   ENDARTERECTOMY FEMORAL Right 06/03/2018   Procedure: RIGHT FEMORAL ENDARTERECTOMY;  Surgeon: Haddock Carlin FORBES, MD;  Location: [facility] OR;  Service: Vascular;  Laterality: Right;   Epidural shots in neck      ESOPHAGOGASTRODUODENOSCOPY (EGD) WITH PROPOFOL  N/A 09/15/2019   Procedure: ESOPHAGOGASTRODUODENOSCOPY (EGD) WITH PROPOFOL ;  Surgeon: Burnette Fallow, MD;   Location: [facility] ENDOSCOPY;  Service: Endoscopy;  Laterality: N/A;   EUS N/A 09/15/2019   Procedure: UPPER ENDOSCOPIC ULTRASOUND (EUS) LINEAR;  Surgeon: Burnette Fallow, MD;  Location: [facility] ENDOSCOPY;  Service: Endoscopy;  Laterality: N/A;   EYE SURGERY Bilateral 2016   cataract removal   FEMORAL ARTERY EXPLORATION Right 12/09/2023   Procedure: Vein Patch Right FEMORAL ARTERY;  Surgeon: Gretta Lonni PARAS, MD;  Location: [facility] OR;  Service: Vascular;  Laterality: Right;   FEMORAL-POPLITEAL BYPASS GRAFT Left 10/21/2013   Procedure: LEFT FEMORAL-POPLITEAL ARTERY BYPASS WITH SAPHENOUS VEIN GRAFT , POPLITEAL ENDARTERECTOMY ,INTRAOPERATIVE  ARTERIOGRAM, vein patch angioplasty to popliteal artery;  Surgeon: Carlin FORBES Haddock, MD;  Location: [facility] OR;  Service: Vascular;  Laterality: Left;   FEMORAL-POPLITEAL BYPASS GRAFT Right 02/08/2016   Procedure: Right FEMORAL- to Above Knee POPLITEAL ARTERY Bypass Graft with reversed saphenous vein and Common Femoral Endarterectomy  with profundoplasty;  Surgeon: Carlin FORBES Haddock, MD;  Location: [facility] OR;  Service: Vascular;  Laterality: Right;   FINE NEEDLE ASPIRATION N/A 09/15/2019   Procedure: FINE NEEDLE ASPIRATION (FNA) LINEAR;  Surgeon: Burnette Fallow, MD;  Location: [facility] ENDOSCOPY;  Service: Endoscopy;  Laterality: N/A;   GROIN DEBRIDEMENT Left 10/28/2013   Procedure: left inner thigh DEBRIDEMENT;  Surgeon: Carlin FORBES Haddock, MD;  Location: [facility] OR;  Service: Vascular;  Laterality: Left;   HAND SURGERY Left 2010   IR FLUORO GUIDE CV LINE RIGHT  12/14/2023   IR REMOVAL TUN CV CATH W/O FL  01/04/2024   IR US  GUIDE VASC ACCESS RIGHT  12/14/2023   LOWER EXTREMITY ANGIOGRAM Bilateral 12/24/2015   Procedure: Lower Extremity Angiogram;  Surgeon: Carlin FORBES Haddock, MD;  Location: [facility] INVASIVE CV LAB;  Service: Cardiovascular;  Laterality: Bilateral;   PATCH ANGIOPLASTY Right 06/03/2018   Procedure: PATCH ANGIOPLASTY USING A XENOSURE 1CM X 14CM BIOLOGIC PATCH;  Surgeon: Haddock Carlin FORBES, MD;   Location: [facility] OR;  Service: Vascular;  Laterality: Right;   PERIPHERAL VASCULAR CATHETERIZATION N/A 12/24/2015   Procedure: Abdominal Aortogram;  Surgeon: Carlin FORBES Haddock, MD;  Location: [facility] INVASIVE CV LAB;  Service: Cardiovascular;  Laterality: N/A;   PERIPHERAL VASCULAR CATHETERIZATION Left 12/24/2015   Procedure: Peripheral Vascular Balloon Angioplasty;  Surgeon: Carlin FORBES Haddock, MD;  Location: [facility] INVASIVE CV LAB;  Service: Cardiovascular;  Laterality: Left;  drug coated balloon   PERIPHERAL VASCULAR CATHETERIZATION N/A 06/30/2016   Procedure: Abdominal Aortogram;  Surgeon: Carlin FORBES Haddock, MD;  Location: [facility] INVASIVE CV LAB;  Service: Cardiovascular;  Laterality: N/A;   PERIPHERAL VASCULAR CATHETERIZATION Bilateral 06/30/2016   Procedure: Lower Extremity Angiography;  Surgeon: Carlin FORBES Haddock, MD;  Location: [facility] INVASIVE CV LAB;  Service: Cardiovascular;  Laterality: Bilateral;   TONSILLECTOMY     TRANSESOPHAGEAL ECHOCARDIOGRAM (CATH LAB) N/A 12/13/2023   Procedure: TRANSESOPHAGEAL ECHOCARDIOGRAM;  Surgeon: Raford Riggs, MD;  Location: [facility] INVASIVE CV LAB;  Service: Cardiovascular;  Laterality: N/A;   VEIN HARVEST Left 12/09/2023   Procedure: BACILIC VEIN HARVEST LEFT ARM;  Surgeon: Gretta Lonni PARAS, MD;  Location: [facility] OR;  Service: Vascular;  Laterality: Left;   VEIN REPAIR Right 12/09/2023   Procedure: VEIN  PATCH RIGHT AXILLIA ARTERY RIGHT;  Surgeon: Gretta Lonni PARAS, MD;  Location: [facility] OR;  Service: Vascular;  Laterality: Right;   Social History   Socioeconomic History   Marital status: Single    Spouse name: Not on file   Number of children: 1   Years of education: Not on file   Highest education level: Not on file  Occupational History   Occupation: Retired    Associate Professor: Scientist, water quality  Tobacco Use   Smoking status: Former    Current packs/day: 0.25    Average packs/day: 0.3 packs/day for 1.1 years (0.3 ttl pk-yrs)    Types: Cigarettes    Start date: 06/2023    Quit date: 10/2014     Passive exposure: Never   Smokeless tobacco: Never   Tobacco comments:    pt states that she is using the E cig only   Vaping Use   Vaping status: Some Days  Substance and Sexual Activity  Alcohol use: Yes    Alcohol/week: 0.0 - 1.0 standard drinks of alcohol    Comment: Occasional; 10-22 rarely    Drug use: No   Sexual activity: Not on file  Other Topics Concern   Not on file  Social History Narrative   Drinks about 2 cups of coffee a day, and some tea    Social Drivers of Corporate investment banker Strain: Not on file  Food Insecurity: No Food Insecurity (12/11/2023)   Hunger Vital Sign    Worried About Running Out of Food in the Last Year: Never true    Ran Out of Food in the Last Year: Never true  Transportation Needs: No Transportation Needs (12/11/2023)   PRAPARE - Administrator, Civil Service (Medical): No    Lack of Transportation (Non-Medical): No  Physical Activity: Not on file  Stress: Not on file  Social Connections: Moderately Isolated (12/11/2023)   Social Connection and Isolation Panel    Frequency of Communication with Friends and Family: More than three times a week    Frequency of Social Gatherings with Friends and Family: More than three times a week    Attends Religious Services: 1 to 4 times per year    Active Member of [employer] or Organizations: No    Attends Banker Meetings: Never    Marital Status: Divorced  Catering manager Violence: Not At Risk (12/11/2023)   Humiliation, Afraid, Rape, and Kick questionnaire    Fear of Current or Ex-Partner: No    Emotionally Abused: No    Physically Abused: No    Sexually Abused: No   Current Outpatient Medications on File Prior to Visit  Medication Sig Dispense Refill   amLODipine  (NORVASC ) 5 MG tablet Take 5 mg by mouth daily.     amoxicillin -clavulanate (AUGMENTIN ) 875-125 MG tablet Take 1 tablet by mouth 2 (two) times daily. (Patient not taking: Reported on 07/28/2024) 20 tablet 0    apixaban  (ELIQUIS ) 5 MG TABS tablet Take 1 tablet (5 mg total) by mouth 2 (two) times daily. (Patient not taking: Reported on 07/28/2024) 60 tablet 11   APIXABAN  (ELIQUIS ) VTE STARTER PACK (10MG  AND 5MG ) Take as directed on package: start with two-5mg  tablets twice daily for 7 days. On day 8, switch to one-5mg  tablet twice daily. 74 each 0   aspirin  EC 81 MG tablet Take 81 mg by mouth at bedtime.     atorvastatin  (LIPITOR) 80 MG tablet Take 1 tablet (80 mg total) by mouth daily. 30 tablet 11   Blood Glucose Monitoring Suppl (ONETOUCH VERIO FLEX SYSTEM) w/Device KIT USE TO CHECK BLOOD SUGAR 3-4 TIMES DAY 1 kit 0   brimonidine (ALPHAGAN) 0.2 % ophthalmic solution 1 drop 3 (three) times daily.     cilostazol  (PLETAL ) 100 MG tablet Take 1 tablet (100 mg total) by mouth 2 (two) times daily before a meal. 60 tablet 11   Continuous Glucose Receiver (FREESTYLE LIBRE 3 READER) DEVI Use to monitor glucose continuously. 1 each 0   Continuous Glucose Sensor (FREESTYLE LIBRE 3 PLUS SENSOR) MISC Inject 1 Device into the skin continuous. Change every 15 days 6 each 3   diclofenac  Sodium (VOLTAREN ) 1 % GEL Apply 1 application. topically daily as needed (leg pain).     DULoxetine  (CYMBALTA ) 60 MG capsule Take 60 mg by mouth daily.     ferrous sulfate  325 (65 FE) MG EC tablet Take 325 mg by mouth daily.     gabapentin  (NEURONTIN ) 300  MG capsule Take 1 capsule (300 mg total) by mouth 3 (three) times daily. 90 capsule 0   glucose blood (ONETOUCH VERIO) test strip Use to check blood sugar 3-4 times a day 400 each 5   insulin  aspart (NOVOLOG  FLEXPEN) 100 UNIT/ML FlexPen 4-8 units with every meal (Patient taking differently: 2-4 units) 15 mL 11   insulin  glargine (LANTUS  SOLOSTAR) 100 UNIT/ML Solostar Pen Inject 5 Units into the skin at bedtime.     Insulin  Pen Needle 32G X 4 MM MISC Use 4x a day 300 each 3   latanoprost  (XALATAN ) 0.005 % ophthalmic solution Place 1 drop into both eyes at bedtime.      lipase/protease/amylase 24000-76000 units CPEP Take 1 capsule (24,000 Units total) by mouth 3 (three) times daily before meals. (Patient taking differently: Take 24,000-48,000 Units by mouth See admin instructions. Take 2 capsules before meals and 1 capsule after meals.) 270 capsule 0   MAGNESIUM  PO Take 1 tablet by mouth daily.     metFORMIN  (GLUCOPHAGE -XR) 500 MG 24 hr tablet Take 1 tablet (500 mg total) by mouth 2 (two) times daily with a meal. 180 tablet 2   Multiple Vitamins-Minerals (CENTRUM SILVER  50+WOMEN) TABS Take 1 tablet by mouth daily.     mupirocin  ointment (BACTROBAN ) 2 % Apply 1 Application topically 2 (two) times daily. Right 1st toe medial border 22 g 0   ondansetron  (ZOFRAN -ODT) 4 MG disintegrating tablet Take 1 tablet (4 mg total) by mouth every 8 (eight) hours as needed for vomiting. 30 tablet 0   OneTouch Delica Lancets 33G MISC Check blood sugar 3-4 times a day 400 each 5   oxybutynin  (DITROPAN -XL) 10 MG 24 hr tablet Take 10 mg by mouth daily.     pantoprazole  (PROTONIX ) 40 MG tablet Take 40 mg by mouth every evening.     polyethylene glycol (MIRALAX  / GLYCOLAX ) 17 g packet Take 17 g by mouth daily as needed for mild constipation.     senna-docusate (SENOKOT-S) 8.6-50 MG tablet Take 1 tablet by mouth daily.     silver  sulfADIAZINE  (SILVADENE ) 1 % cream Apply 1 Application topically daily. 50 g 0   temazepam  (RESTORIL ) 30 MG capsule Take 1 capsule (30 mg total) by mouth at bedtime as needed for sleep. 30 capsule 0   timolol  (TIMOPTIC ) 0.5 % ophthalmic solution Place 1 drop into both eyes every morning. (Patient not taking: Reported on 07/28/2024)     traMADol  (ULTRAM ) 50 MG tablet Take 2 tablets (100 mg total) by mouth 3 (three) times daily as needed. 90 tablet 1   traZODone  (DESYREL ) 50 MG tablet Take 75 mg by mouth at bedtime as needed for sleep.     No current facility-administered medications on file prior to visit.   Allergies  Allergen Reactions   Wound Dressing  Adhesive Rash and Other (See Comments)    Pt states that tape and electrodes leave red scars on her skin and her skin is very sensitive.  Paper tape ok to use   Family History  Problem Relation Age of Onset   Hypertension Mother    Alzheimer's disease Mother    Heart disease Father    Heart attack Father        MI at age 43   Hypertension Sister    Diabetes Brother    Hyperlipidemia Brother    Hypertension Brother    Heart disease Brother    Heart attack Brother    Breast cancer Maternal Aunt    PE:  BP 120/70   Pulse 69   Ht 5' 3 (1.6 m)   Wt 99 lb 6.4 oz (45.1 kg)   SpO2 97%   BMI 17.61 kg/m  Wt Readings from Last 3 Encounters:  08/07/24 99 lb 6.4 oz (45.1 kg)  07/28/24 101 lb 6.4 oz (46 kg)  06/30/24 99 lb 6.4 oz (45.1 kg)   Constitutional: thin, in NAD Eyes:  EOMI, no exophthalmos ENT: no neck masses, no cervical lymphadenopathy Cardiovascular: RRR, No MRG Respiratory: CTA B Musculoskeletal: no deformities Skin:no rashes Neurological: no tremor with outstretched hands  1. DM2, insulin -dependent, controlled, with complications - CAD - PAD - s/p stents  Component     Latest Ref Rng 04/20/2022  Glucose     65 - 99 mg/dL 859 (H)   C-Peptide     0.80 - 3.85 ng/mL 1.50     2.  Hyperlipidemia  PLAN:  1. Patient with longstanding, uncontrolled, type 2 diabetes, on oral antidiabetic regimen and bolus insulin  with NovoLog .  We stopped basal insulin  last year due to many lows. She had a blood sugar of 27 during which she lost consciousness.  This value was reported by EMS, not by her sensor.  Her HbA1c in 09/2023 was higher, at 9.5%, but then improved to 6.1%.  At that time, I advised her to stop Lantus  and only use NovoLog  as needed before larger meals, at lower doses.  At last visit, sugars were more fluctuating than before with still low blood sugars at different times of the day but significant variability after lunch.  Upon questioning, she was keeping her NovoLog   in the fridge and not taking it with her when she was eating out.  Also, some of the blood sugars were low after meals due to taking insulin  too late after already starting to eat.  Upon questioning, she was also not using the recommended 2 to 3 units of NovoLog  before larger meals but was using a sliding scale and already guiding her insulin  doses based on the sugars before the meals and not the actual meals.  This was probably the reason why her blood sugars were so fluctuating and I recommended to take a lower amount of insulin  before regular meal and a slightly higher amount before a larger meal.  We continued the same dose of metformin .  HbA1c at last visit was higher, at 7.4% CGM interpretation: -At today's visit, we reviewed her CGM downloads: It appears that 76% of values are in target range (goal >70%), while 22% are higher than 180 (goal <25%), and 2% are lower than 70 (goal <4%).  The calculated average blood sugar is 143.  The projected HbA1c for the next 3 months (GMI) is 6.7%. -Reviewing the CGM trends, sugars appear to be usually at goal during the day with the exception of higher values after lunch.  They improve afterwards but then sugars increase again in the second half of the night with a peak around 4 AM.  Upon questioning, she has been off metformin  for 2 months.  She has nausea now so we will not start this back.  Of note, metformin  was stopped before her nausea started.  She is taking a lower dose of NovoLog  than recommended.  She is mostly taking 2 units before meals and we discussed that she may need 3 to 4 units.  Discussed about which meals would require higher doses of insulin .  She has an occasional low blood sugar and we discussed about trying to  eat better.  For now, she has nausea but she is going to obtain nausea medication from PCP. - I suggested to:  Patient Instructions  Please continue: - Novolog : - 2-3 units 15 min before a regular meal - 4 units 15 min before a larger  meal  Please return in 3-4 months.  - we checked her HbA1c: 6.3% (lower) - advised to check sugars at different times of the day - 4x a day, rotating check times - advised for yearly eye exams >> she is UTD - return to clinic in 3-4 months  2. HL - Reviewed latest lipid panel from 01/2024: Fractions at goal.  Her low HDL improved significantly. - Continues lovastatin 40 mg daily without side effects.  Lela Fendt, MD PhD [facility]

## 2024-08-07 NOTE — Patient Instructions (Addendum)
 Please continue: - Novolog : - 2-3 units 15 min before a regular meal - 4 units 15 min before a larger meal  Please return in 3-4 months.

## 2024-08-07 NOTE — Therapy (Signed)
 OUTPATIENT PHYSICAL THERAPY NEURO TREATMENT   Patient Name: Jill Higgins MRN: [id number] DOB:1946-03-14, 78 y.o., female Today's Date: 08/07/2024   PCP: Sharps Pellet, MD REFERRING PROVIDER: Sharps Pellet, MD  PHYSICAL THERAPY DISCHARGE SUMMARY  Visits from Start of Care: 2  Current functional level related to goals / functional outcomes: Pt is mod I w/ADLs w/use of SPC   Remaining deficits: High fall risk, global deconditioning    Education / Equipment: Informed pt to return to PT once medically stable and able to eat, as she currently is unable to tolerate eating due to nausea   Patient agrees to discharge. Patient goals were not met. Patient is being discharged due to a change in medical status.   END OF SESSION:  PT End of Session - 08/07/24 1438     Visit Number 2    Number of Visits 17   Plus eval   Date for Recertification  09/18/24   Due to delay in scheduling   Authorization Type [employer]    PT Start Time 1435    PT Stop Time 1447   DC   PT Time Calculation (min) 12 min    Activity Tolerance Patient tolerated treatment well    Behavior During Therapy WFL for tasks assessed/performed           Past Medical History:  Diagnosis Date   Bronchitis    Bulging disc    in neck   Depression with anxiety    Diabetes mellitus    Type II   Fibromyalgia    pt. denies   GERD (gastroesophageal reflux disease)    Glaucoma    History of bronchitis    Hyperlipidemia    Hypertension    Overactive bladder    Peripheral vascular disease (HCC)    Pneumonia    Sleep apnea    recent test negative for sleep apnea   Tobacco abuse    Urinary frequency    Past Surgical History:  Procedure Laterality Date   ABDOMINAL AORTAGRAM N/A 08/15/2013   Procedure: ABDOMINAL EZELLA;  Surgeon: Carlin FORBES Haddock, MD;  Location: [facility] CATH LAB;  Service: Cardiovascular;  Laterality: N/A;   ABDOMINAL AORTAGRAM N/A 06/19/2014   Procedure: ABDOMINAL EZELLA;  Surgeon:  Carlin FORBES Haddock, MD;  Location: [facility] CATH LAB;  Service: Cardiovascular;  Laterality: N/A;   ABDOMINAL AORTOGRAM W/LOWER EXTREMITY N/A 05/10/2018   Procedure: ABDOMINAL AORTOGRAM W/LOWER EXTREMITY;  Surgeon: Haddock Carlin FORBES, MD;  Location: [facility] INVASIVE CV LAB;  Service: Cardiovascular;  Laterality: N/A;  bilateral   ABDOMINAL AORTOGRAM W/LOWER EXTREMITY N/A 04/20/2023   Procedure: ABDOMINAL AORTOGRAM W/LOWER EXTREMITY;  Surgeon: Magda Debby SAILOR, MD;  Location: [facility] INVASIVE CV LAB;  Service: Cardiovascular;  Laterality: N/A;   Aortogram w/ PTA  05/14/08,  11-04-10   Bilateral aortogram w/ bilateral  SFA PTA  stenting    APPLICATION OF WOUND VAC Right 12/09/2023   Procedure: APPLICATION OF WOUND VAC;  Surgeon: Gretta Lonni PARAS, MD;  Location: [facility] OR;  Service: Vascular;  Laterality: Right;   AXILLARY-FEMORAL BYPASS GRAFT Right 11/15/2023   Procedure: RIGHT AXILLO-FEMORAL BYPASS GRAFT;  Surgeon: Magda Debby SAILOR, MD;  Location: [facility] OR;  Service: Vascular;  Laterality: Right;   AXILLARY-FEMORAL BYPASS GRAFT Right 12/09/2023   Procedure: EXCISION AXILLA-BIFEMORAL RIGHT;  Surgeon: Gretta Lonni PARAS, MD;  Location: [facility] OR;  Service: Vascular;  Laterality: Right;   BREAST SURGERY Right    boil removal   BUNIONECTOMY     L foot in the  1980s   COLONOSCOPY     2014   DILATION AND CURETTAGE OF UTERUS     X4   ENDARTERECTOMY FEMORAL Right 06/03/2018   Procedure: RIGHT FEMORAL ENDARTERECTOMY;  Surgeon: Harvey Carlin BRAVO, MD;  Location: [facility] OR;  Service: Vascular;  Laterality: Right;   Epidural shots in neck      ESOPHAGOGASTRODUODENOSCOPY (EGD) WITH PROPOFOL  N/A 09/15/2019   Procedure: ESOPHAGOGASTRODUODENOSCOPY (EGD) WITH PROPOFOL ;  Surgeon: Burnette Fallow, MD;  Location: [facility] ENDOSCOPY;  Service: Endoscopy;  Laterality: N/A;   EUS N/A 09/15/2019   Procedure: UPPER ENDOSCOPIC ULTRASOUND (EUS) LINEAR;  Surgeon: Burnette Fallow, MD;  Location: [facility] ENDOSCOPY;  Service: Endoscopy;  Laterality: N/A;   EYE  SURGERY Bilateral 2016   cataract removal   FEMORAL ARTERY EXPLORATION Right 12/09/2023   Procedure: Vein Patch Right FEMORAL ARTERY;  Surgeon: Gretta Lonni PARAS, MD;  Location: [facility] OR;  Service: Vascular;  Laterality: Right;   FEMORAL-POPLITEAL BYPASS GRAFT Left 10/21/2013   Procedure: LEFT FEMORAL-POPLITEAL ARTERY BYPASS WITH SAPHENOUS VEIN GRAFT , POPLITEAL ENDARTERECTOMY ,INTRAOPERATIVE ARTERIOGRAM, vein patch angioplasty to popliteal artery;  Surgeon: Carlin BRAVO Harvey, MD;  Location: [facility] OR;  Service: Vascular;  Laterality: Left;   FEMORAL-POPLITEAL BYPASS GRAFT Right 02/08/2016   Procedure: Right FEMORAL- to Above Knee POPLITEAL ARTERY Bypass Graft with reversed saphenous vein and Common Femoral Endarterectomy  with profundoplasty;  Surgeon: Carlin BRAVO Harvey, MD;  Location: [facility] OR;  Service: Vascular;  Laterality: Right;   FINE NEEDLE ASPIRATION N/A 09/15/2019   Procedure: FINE NEEDLE ASPIRATION (FNA) LINEAR;  Surgeon: Burnette Fallow, MD;  Location: [facility] ENDOSCOPY;  Service: Endoscopy;  Laterality: N/A;   GROIN DEBRIDEMENT Left 10/28/2013   Procedure: left inner thigh DEBRIDEMENT;  Surgeon: Carlin BRAVO Harvey, MD;  Location: [facility] OR;  Service: Vascular;  Laterality: Left;   HAND SURGERY Left 2010   IR FLUORO GUIDE CV LINE RIGHT  12/14/2023   IR REMOVAL TUN CV CATH W/O FL  01/04/2024   IR US  GUIDE VASC ACCESS RIGHT  12/14/2023   LOWER EXTREMITY ANGIOGRAM Bilateral 12/24/2015   Procedure: Lower Extremity Angiogram;  Surgeon: Carlin BRAVO Harvey, MD;  Location: [facility] INVASIVE CV LAB;  Service: Cardiovascular;  Laterality: Bilateral;   PATCH ANGIOPLASTY Right 06/03/2018   Procedure: PATCH ANGIOPLASTY USING A XENOSURE 1CM X 14CM BIOLOGIC PATCH;  Surgeon: Harvey Carlin BRAVO, MD;  Location: [facility] OR;  Service: Vascular;  Laterality: Right;   PERIPHERAL VASCULAR CATHETERIZATION N/A 12/24/2015   Procedure: Abdominal Aortogram;  Surgeon: Carlin BRAVO Harvey, MD;  Location: [facility] INVASIVE CV LAB;  Service: Cardiovascular;   Laterality: N/A;   PERIPHERAL VASCULAR CATHETERIZATION Left 12/24/2015   Procedure: Peripheral Vascular Balloon Angioplasty;  Surgeon: Carlin BRAVO Harvey, MD;  Location: [facility] INVASIVE CV LAB;  Service: Cardiovascular;  Laterality: Left;  drug coated balloon   PERIPHERAL VASCULAR CATHETERIZATION N/A 06/30/2016   Procedure: Abdominal Aortogram;  Surgeon: Carlin BRAVO Harvey, MD;  Location: [facility] INVASIVE CV LAB;  Service: Cardiovascular;  Laterality: N/A;   PERIPHERAL VASCULAR CATHETERIZATION Bilateral 06/30/2016   Procedure: Lower Extremity Angiography;  Surgeon: Carlin BRAVO Harvey, MD;  Location: [facility] INVASIVE CV LAB;  Service: Cardiovascular;  Laterality: Bilateral;   TONSILLECTOMY     TRANSESOPHAGEAL ECHOCARDIOGRAM (CATH LAB) N/A 12/13/2023   Procedure: TRANSESOPHAGEAL ECHOCARDIOGRAM;  Surgeon: Raford Riggs, MD;  Location: [facility] INVASIVE CV LAB;  Service: Cardiovascular;  Laterality: N/A;   VEIN HARVEST Left 12/09/2023   Procedure: BACILIC VEIN HARVEST LEFT ARM;  Surgeon: Gretta Lonni PARAS, MD;  Location: [facility] OR;  Service:  Vascular;  Laterality: Left;   VEIN REPAIR Right 12/09/2023   Procedure: VEIN  PATCH RIGHT AXILLIA ARTERY RIGHT;  Surgeon: Gretta Lonni PARAS, MD;  Location: [facility] OR;  Service: Vascular;  Laterality: Right;   Patient Active Problem List   Diagnosis Date Noted   Chronic pain syndrome 06/30/2024   Chronic bilateral low back pain with right-sided sciatica 06/30/2024   Greater trochanteric bursitis of right hip 06/30/2024   Bacteremia 12/13/2023   Protein-calorie malnutrition, severe 12/11/2023   Vascular graft infection, initial encounter (HCC) 12/07/2023   Sepsis (HCC) 12/07/2023   Peripheral arterial disease (HCC) 04/20/2023   Pancreatitis 01/25/2022   Chronic pancreatitis (HCC) 09/02/2019   Type 2 diabetes mellitus with vascular disease (HCC) 09/02/2019   Diabetes (HCC) 05/23/2018   Other spondylosis with radiculopathy, cervical region 03/05/2018   Foraminal stenosis of cervical  region 09/11/2017   Partial tear of right rotator cuff 04/04/2017   Aftercare following surgery of the circulatory system 02/23/2016   S/P femoropopliteal bypass surgery 02/23/2016   PAD (peripheral artery disease) (HCC) 02/08/2016   Leg edema, left 12/18/2013   Aftercare following surgery of the circulatory system, NEC 11/19/2013   Ischemic leg 10/21/2013   Peripheral vascular disease, unspecified (HCC) 09/18/2013   Preop cardiovascular exam 09/08/2013   Hypertension    Hyperlipidemia    Peripheral vascular disease (HCC)    Fibromyalgia    GERD (gastroesophageal reflux disease)    Depression with anxiety    Pain in limb 04/03/2013   Atherosclerosis of native artery of extremity with intermittent claudication (HCC) 08/01/2012   Claudication (HCC) 12/28/2011    ONSET DATE: 07/01/2024 (referral)   REFERRING DIAG: R53.1 (ICD-10-CM) - Weakness  THERAPY DIAG:  Muscle weakness (generalized)  Unsteadiness on feet  Other abnormalities of gait and mobility  Rationale for Evaluation and Treatment: Rehabilitation  SUBJECTIVE:                                                                                                                                                                                             SUBJECTIVE STATEMENT: Pt presents alone w/SPC. States she went to have a CT last week and the doctor sent her to the ER due to low BP and malaise. Pt was held for observation in the hospital over the weekend and is starting to feel better, but is very fatigued. Reports she has been to multiple doctor appointments today and is exhausted, has several more next week including more imaging to rule out cancer.   Pt reports she has been unable to eat much as she will throw up if she eats more than a  few bites. Feels very weak but her BP is better today.     Pt accompanied by: self  PERTINENT HISTORY: Right common femoral endarterectomy with right femoral to above-knee popliteal  bypass, stenting of the right iliac and common femoral arteries, bronchitis, bulging disc, depression w/anxiety, DM II, fibromyalgia, GERD, glaucoma, hyperlipidemia, HTN, PVD, and sleep apnea.  PAIN:  Are you having pain? Yes: NPRS scale: 4-5/10 Pain location: RLE  Pain description: throbbing Aggravating factors: Walking Relieving factors: Voltaren   PRECAUTIONS: Fall  RED FLAGS: None   WEIGHT BEARING RESTRICTIONS: No  FALLS: Has patient fallen in last 6 months? Yes. Number of falls 4-5  LIVING ENVIRONMENT: Lives with: lives alone Lives in: House/apartment Stairs: No Has following equipment at home: Single point cane, Walker - 4 wheeled, shower chair, and Grab bars  PLOF: Requires assistive device for independence and Needs assistance with homemaking  PATIENT GOALS: Balance  OBJECTIVE:  Note: Objective measures were completed at Evaluation unless otherwise noted.  DIAGNOSTIC FINDINGS: MRI of lumbar spine performed on 07/13/24 and has not been resulted    COGNITION: Overall cognitive status: Within functional limits for tasks assessed   SENSATION: Pt reports a tight feeling in BLEs and numbness in BLEs   EDEMA: Pt reports chronic swelling in BLEs     POSTURE: rounded shoulders and increased thoracic kyphosis  LOWER EXTREMITY ROM:     Active  Right Eval Left Eval  Hip flexion    Hip extension    Hip abduction    Hip adduction    Hip internal rotation    Hip external rotation    Knee flexion    Knee extension    Ankle dorsiflexion    Ankle plantarflexion    Ankle inversion    Ankle eversion     (Blank rows = not tested)  LOWER EXTREMITY MMT:  Tested in seated position   MMT Right Eval Left Eval  Hip flexion 3 3-  Hip extension    Hip abduction 4- 4-  Hip adduction 4- 4-  Hip internal rotation    Hip external rotation    Knee flexion 4 4  Knee extension 4 4  Ankle dorsiflexion 4 4  Ankle plantarflexion    Ankle inversion    Ankle eversion     (Blank rows = not tested)  BED MOBILITY:  Not tested Pt reports independence w/this, sleeps on the sofa sometimes but mostly in her bed   TRANSFERS: Sit to stand: SBA  Assistive device utilized: Single point cane     Stand to sit: SBA  Assistive device utilized: Single point cane     Decreased anterior weight shift, lateral shift to L side, heavy reliance on BUE support w/posterior lean in stance   RAMP:  Not tested  CURB:  Not tested  STAIRS: Not tested GAIT: Gait pattern: step through pattern, decreased stride length, decreased hip/knee flexion- Right, decreased hip/knee flexion- Left, lateral hip instability, lateral lean- Left, decreased trunk rotation, and narrow BOS Distance walked: Various clinic distances  Assistive device utilized: Single point cane Level of assistance: CGA and Min A Comments: Pt required min A to stabilize w/immediate standing balance due to lean to L side. Noted compensated trendelenburg to L side as well.    FUNCTIONAL TESTS:      PATIENT SURVEYS:  ABC scale to be assessed   VITALS There were no vitals filed for this visit.  TREATMENT :   Self-care/home management  Discussed plan to DC from PT at this time until pt is more medically stable, as she is unable to tolerate full PT session and is not eating. Pt verbalized agreement and states she would like to prioritize her other doctor appointments to figure out why she is so nauseous prior to participating in PT. Educated pt on how to return to PT when she is more medically stable and able to tolerate session. Pt verbalized appreciation and understanding.   PATIENT EDUCATION: Education details: See self-care above  Person educated: Patient Education method: Explanation Education comprehension: verbalized understanding  HOME EXERCISE PROGRAM: To be established    GOALS: ALL DC DUE TO DC FROM PT 2/2 MEDICAL STATUS CHANGE Goals reviewed with patient? Yes  SHORT TERM GOALS: Target date: 08/12/2024    Pt will be independent with initial HEP for improved strength, balance, transfers and gait.  Baseline: not established on eval  Goal status: INITIAL  2.  ABC scale to be assessed and LTG updated  Baseline:  Goal status: INITIAL  3.  Pt will improve gait velocity to at least 1.0 ft/s w/LRAD and SBA for improved gait efficiency and independence   Baseline: 0.77 ft/s w/SPC and CGA Goal status: INITIAL  4.  Pt will improve 5 x STS to less than or equal to 45 seconds to demonstrate improved functional strength and transfer efficiency.   Baseline: 53.41s w/BUE support  Goal status: INITIAL  5.  Pt will perform floor transfer w/min A for improved fall recovery and functional BLE strength  Baseline:  Goal status: INITIAL   LONG TERM GOALS: Target date: 09/09/2024   Pt will be independent with final HEP for improved strength, balance, transfers and gait.  Baseline:  Goal status: INITIAL  2.  Pt will improve gait velocity to at least 1.4 ft/s w/LRAD mod I for improved gait efficiency and reduced fall risk   Baseline: 0.77 ft/s w/SPC and CGA Goal status: INITIAL  3.  Pt will improve 5 x STS to less than or equal to 38 seconds to demonstrate improved functional strength and transfer efficiency.  Baseline: 53.41s w/BUE support Goal status: INITIAL  4.  ABC scale goal  Baseline:  Goal status: INITIAL  5.  Pt will perform floor transfer w/CGA for improved fall recovery and functional BLE strength  Baseline:  Goal status: INITIAL   ASSESSMENT:  CLINICAL IMPRESSION: Pt reports she was in hospital all weekend due to nausea, low BP and abnormal CT. Pt states she has been unable to keep food down and is very weak. Pt reports she has several medical appointments coming up and would like to focus on these for now and return to PT once she is  more medically stable. Pt informed of how to return to PT when she is ready and pt verbalized understanding and appreciation    OBJECTIVE IMPAIRMENTS: Abnormal gait, decreased activity tolerance, decreased balance, decreased endurance, decreased knowledge of use of DME, decreased mobility, decreased ROM, decreased strength, impaired sensation, improper body mechanics, and pain  ACTIVITY LIMITATIONS: carrying, lifting, bending, standing, squatting, stairs, transfers, locomotion level, and caring for others  PARTICIPATION LIMITATIONS: meal prep, cleaning, laundry, driving, shopping, community activity, and yard work  PERSONAL FACTORS: Fitness, Past/current experiences, and 1-2 comorbidities: PVD in RLE are also affecting patient's functional outcome.   REHAB POTENTIAL: Good  CLINICAL DECISION MAKING: Evolving/moderate complexity  EVALUATION COMPLEXITY: Moderate  PLAN:  PT FREQUENCY: 2x/week  PT DURATION: 8 weeks  PLANNED INTERVENTIONS: 97164- PT Re-evaluation, 97750- Physical Performance Testing, 97110-Therapeutic exercises, 97530- Therapeutic activity, 97112- Neuromuscular re-education, [phone]- Self Care, [id number]- Manual therapy, [phone]- Gait training, [phone]- Aquatic Therapy, [phone]- Electrical stimulation (manual), [phone] (1-2 muscles), [phone] (3+ muscles)- Dry Needling, Patient/Family education, Balance training, Stair training, Joint mobilization, Spinal mobilization, Vestibular training, and DME instructions    Marlon BRAVO Concepcion Kirkpatrick, PT, DPT 08/07/2024, 2:52 PM

## 2024-08-07 NOTE — Addendum Note (Signed)
 Addended by: CLEOTILDE ROLIN RAMAN on: 08/07/2024 02:26 PM   Modules accepted: Orders

## 2024-08-11 ENCOUNTER — Other Ambulatory Visit: Payer: Self-pay

## 2024-08-11 MED ORDER — INSULIN PEN NEEDLE 32G X 4 MM MISC: 3 refills | Status: DC

## 2024-08-11 MED ORDER — NOVOLOG FLEXPEN 100 UNIT/ML ~~LOC~~ SOPN: 2.0000 [IU] | PEN_INJECTOR | Freq: Three times a day (TID) | SUBCUTANEOUS | 11 refills | Status: DC

## 2024-08-12 ENCOUNTER — Ambulatory Visit: Admitting: Physical Therapy

## 2024-08-12 DIAGNOSIS — R970 Elevated carcinoembryonic antigen [CEA]: Secondary | ICD-10-CM | POA: Diagnosis not present

## 2024-08-12 DIAGNOSIS — R918 Other nonspecific abnormal finding of lung field: Secondary | ICD-10-CM | POA: Diagnosis not present

## 2024-08-13 ENCOUNTER — Ambulatory Visit: Admitting: Podiatry

## 2024-08-13 DIAGNOSIS — R197 Diarrhea, unspecified: Secondary | ICD-10-CM | POA: Diagnosis not present

## 2024-08-13 DIAGNOSIS — K8681 Exocrine pancreatic insufficiency: Secondary | ICD-10-CM | POA: Diagnosis not present

## 2024-08-13 DIAGNOSIS — K861 Other chronic pancreatitis: Secondary | ICD-10-CM | POA: Diagnosis not present

## 2024-08-14 ENCOUNTER — Ambulatory Visit: Admitting: Physical Therapy

## 2024-08-14 ENCOUNTER — Encounter: Payer: Self-pay | Admitting: Podiatry

## 2024-08-14 ENCOUNTER — Ambulatory Visit (INDEPENDENT_AMBULATORY_CARE_PROVIDER_SITE_OTHER): Admitting: Podiatry

## 2024-08-14 DIAGNOSIS — E1159 Type 2 diabetes mellitus with other circulatory complications: Secondary | ICD-10-CM

## 2024-08-14 DIAGNOSIS — M79675 Pain in left toe(s): Secondary | ICD-10-CM | POA: Diagnosis not present

## 2024-08-14 DIAGNOSIS — B351 Tinea unguium: Secondary | ICD-10-CM | POA: Diagnosis not present

## 2024-08-14 DIAGNOSIS — M79674 Pain in right toe(s): Secondary | ICD-10-CM

## 2024-08-14 NOTE — Progress Notes (Signed)
 This patient returns to my office for at risk foot care.  This patient requires this care by a professional since this patient will be at risk due to having  PVD and diabetes.  This patient is unable to cut nails herself since the patient cannot reach her nails.These nails are painful walking and wearing shoes.  This patient presents for at risk foot care today.  General Appearance  Alert, conversant and in no acute stress.  Vascular  Dorsalis pedis and posterior tibial  pulses are palpable  bilaterally.  Capillary return is within normal limits  bilaterally. Temperature is within normal limits  bilaterally.  Neurologic  Senn-Weinstein monofilament wire test within normal limits  bilaterally. Muscle power within normal limits bilaterally.  Nails Thick disfigured discolored nails with subungual debris  from hallux to fifth toes bilaterally. No evidence of bacterial infection or drainage bilaterally.  Orthopedic  No limitations of motion  feet .  No crepitus or effusions noted.  No bony pathology or digital deformities noted.  Skin  normotropic skin with no porokeratosis noted bilaterally.  No signs of infections or ulcers noted.     Onychomycosis  Pain in right toes  Pain in left toes  Consent was obtained for treatment procedures.   Mechanical debridement of nails 1-5  bilaterally performed with a nail nipper.  Filed with dremel without incident.    Return office visit    3 months                  Told patient to return for periodic foot care and evaluation due to potential at risk complications.   Cordella Bold DPM

## 2024-08-19 ENCOUNTER — Ambulatory Visit: Admitting: Physical Therapy

## 2024-08-19 DIAGNOSIS — E1165 Type 2 diabetes mellitus with hyperglycemia: Secondary | ICD-10-CM | POA: Diagnosis not present

## 2024-08-19 DIAGNOSIS — E1169 Type 2 diabetes mellitus with other specified complication: Secondary | ICD-10-CM | POA: Diagnosis not present

## 2024-08-19 DIAGNOSIS — H409 Unspecified glaucoma: Secondary | ICD-10-CM | POA: Diagnosis not present

## 2024-08-19 DIAGNOSIS — E7800 Pure hypercholesterolemia, unspecified: Secondary | ICD-10-CM | POA: Diagnosis not present

## 2024-08-21 ENCOUNTER — Ambulatory Visit: Admitting: Physical Therapy

## 2024-08-22 ENCOUNTER — Telehealth: Payer: Self-pay | Admitting: Dietician

## 2024-08-22 MED ORDER — INSULIN LISPRO (1 UNIT DIAL) 100 UNIT/ML (KWIKPEN): 2.0000 [IU] | PEN_INJECTOR | Freq: Three times a day (TID) | SUBCUTANEOUS | 3 refills | Status: DC

## 2024-08-22 NOTE — Telephone Encounter (Signed)
 Patient called and states that her prefilled Novolog  pens are no longer covered by her insurance and that Humalog is covered.    Will forward to clinical pool.  Leita Constable, RD, LDN, CDCES, DipACLM

## 2024-08-22 NOTE — Telephone Encounter (Signed)
 Refill has been sent   Requested Prescriptions   Signed Prescriptions Disp Refills   insulin  lispro (HUMALOG) 100 UNIT/ML KwikPen 10 mL 3    Sig: Inject 2-4 Units into the skin 3 (three) times daily.    Authorizing Provider: TRIXIE FILE    Ordering User: CLEOTILDE ROLIN RAMAN

## 2024-08-26 ENCOUNTER — Ambulatory Visit: Admitting: Physical Therapy

## 2024-08-27 DIAGNOSIS — K8681 Exocrine pancreatic insufficiency: Secondary | ICD-10-CM | POA: Diagnosis not present

## 2024-08-27 DIAGNOSIS — R197 Diarrhea, unspecified: Secondary | ICD-10-CM | POA: Diagnosis not present

## 2024-08-28 ENCOUNTER — Ambulatory Visit: Admitting: Physical Therapy

## 2024-08-29 ENCOUNTER — Telehealth: Payer: Self-pay | Admitting: Internal Medicine

## 2024-08-29 MED ORDER — INSULIN GLARGINE 100 UNIT/ML SOLOSTAR PEN
8.0000 [IU] | PEN_INJECTOR | Freq: Every day | SUBCUTANEOUS | 2 refills | Status: AC
Start: 2024-08-29 — End: ?

## 2024-08-29 NOTE — Addendum Note (Signed)
 Addended by: CLEOTILDE ROLIN RAMAN on: 08/29/2024 02:02 PM   Modules accepted: Orders

## 2024-08-29 NOTE — Telephone Encounter (Signed)
 I called and spoke with Jill Higgins she states that she has been taking her insulin  as prescribed. She did verify her readings with a finger stick and she states they match. Currently while talking with her on the phone it was 328

## 2024-08-29 NOTE — Telephone Encounter (Signed)
 Patient is calling to state that her blood sugar levels have been running high for the last couple of days and she wants to know what she needs to do.  08/28/2024  Between 9 PM-10P   387 10/10//2025  9:00 AM  249 08/29/2024   10:00 AM   320

## 2024-08-29 NOTE — Telephone Encounter (Signed)
 Pt has been notified and voices understanding.

## 2024-09-01 NOTE — Telephone Encounter (Signed)
 Patient called and left a detailed message stating: Her blood sugar right now is 128, Sunday in the high 100s., This morning around 130-150s.   These are her numbers since she started the lantus .

## 2024-09-02 ENCOUNTER — Ambulatory Visit: Admitting: Physical Therapy

## 2024-09-03 NOTE — Telephone Encounter (Signed)
Called and left a detailed voicemail.

## 2024-09-04 ENCOUNTER — Ambulatory Visit: Admitting: Physical Therapy

## 2024-09-04 DIAGNOSIS — I739 Peripheral vascular disease, unspecified: Secondary | ICD-10-CM | POA: Diagnosis not present

## 2024-09-04 DIAGNOSIS — I10 Essential (primary) hypertension: Secondary | ICD-10-CM | POA: Diagnosis not present

## 2024-09-04 DIAGNOSIS — M62838 Other muscle spasm: Secondary | ICD-10-CM | POA: Diagnosis not present

## 2024-09-08 DIAGNOSIS — R911 Solitary pulmonary nodule: Secondary | ICD-10-CM | POA: Diagnosis not present

## 2024-09-08 DIAGNOSIS — K861 Other chronic pancreatitis: Secondary | ICD-10-CM | POA: Diagnosis not present

## 2024-09-08 DIAGNOSIS — R970 Elevated carcinoembryonic antigen [CEA]: Secondary | ICD-10-CM | POA: Diagnosis not present

## 2024-09-08 DIAGNOSIS — K8689 Other specified diseases of pancreas: Secondary | ICD-10-CM | POA: Diagnosis not present

## 2024-09-08 DIAGNOSIS — Z90410 Acquired total absence of pancreas: Secondary | ICD-10-CM | POA: Diagnosis not present

## 2024-09-08 DIAGNOSIS — Z9049 Acquired absence of other specified parts of digestive tract: Secondary | ICD-10-CM | POA: Diagnosis not present

## 2024-09-09 ENCOUNTER — Ambulatory Visit: Admitting: Physical Therapy

## 2024-09-11 ENCOUNTER — Ambulatory Visit: Admitting: Physical Therapy

## 2024-09-15 ENCOUNTER — Other Ambulatory Visit: Payer: Self-pay

## 2024-09-15 DIAGNOSIS — K551 Chronic vascular disorders of intestine: Secondary | ICD-10-CM

## 2024-09-16 ENCOUNTER — Other Ambulatory Visit: Payer: Self-pay | Admitting: Family Medicine

## 2024-09-16 ENCOUNTER — Ambulatory Visit: Admitting: Physical Therapy

## 2024-09-16 DIAGNOSIS — Z1231 Encounter for screening mammogram for malignant neoplasm of breast: Secondary | ICD-10-CM

## 2024-09-17 ENCOUNTER — Telehealth: Payer: Self-pay

## 2024-09-17 NOTE — Telephone Encounter (Signed)
 Dr Trixie Pt left detail lvm on Jill Higgins phone regarding having low sugar last night in 40's and this morning was elevated 297. Pt reports having elevated blood sugar 1 hrs ago sugar was 397 and is currently 303. Please advise.

## 2024-09-18 ENCOUNTER — Ambulatory Visit: Admitting: Physical Therapy

## 2024-09-18 NOTE — Telephone Encounter (Signed)
 Lvm for pt to call back regarding elevated blood sugar.

## 2024-09-18 NOTE — Telephone Encounter (Signed)
 Spoke with patient.  She is unable to write down recommendations.  She says she has been taking her medications as directed.  Follow up appointment scheduled to discuss concern.

## 2024-09-22 ENCOUNTER — Ambulatory Visit: Admitting: Internal Medicine

## 2024-09-22 ENCOUNTER — Other Ambulatory Visit: Payer: Self-pay

## 2024-09-22 ENCOUNTER — Encounter: Payer: Self-pay | Admitting: Internal Medicine

## 2024-09-22 VITALS — BP 122/70 | HR 77 | Ht 63.0 in | Wt 103.2 lb

## 2024-09-22 DIAGNOSIS — E1151 Type 2 diabetes mellitus with diabetic peripheral angiopathy without gangrene: Secondary | ICD-10-CM | POA: Diagnosis not present

## 2024-09-22 DIAGNOSIS — Z794 Long term (current) use of insulin: Secondary | ICD-10-CM | POA: Diagnosis not present

## 2024-09-22 DIAGNOSIS — E7800 Pure hypercholesterolemia, unspecified: Secondary | ICD-10-CM

## 2024-09-22 MED ORDER — INSULIN PEN NEEDLE 32G X 4 MM MISC: 12 refills | Status: AC

## 2024-09-22 MED ORDER — LYUMJEV KWIKPEN 100 UNIT/ML ~~LOC~~ SOPN
4.0000 [IU] | PEN_INJECTOR | Freq: Three times a day (TID) | SUBCUTANEOUS | 3 refills | Status: AC
Start: 2024-09-22 — End: ?

## 2024-09-22 MED ORDER — GLUCAGON 3 MG/DOSE NA POWD: 3.0000 mg | Freq: Once | NASAL | 11 refills | Status: AC | PRN

## 2024-09-22 NOTE — Patient Instructions (Addendum)
 Please change: - Lyumjev at the beginning of the meals - 4-5 units before meals  - Lantus  6 units daily   For correction, do no take more than 2 units.  Please return in 1.5 months.

## 2024-09-22 NOTE — Progress Notes (Signed)
 Patient ID: Jill Higgins, female   DOB: [birth year], 78 y.o.   MRN: [id number]  HPI: Jill Higgins is a 78 y.o.-year-old female, returning for follow-up for DM2, dx in 2013, insulin -dependent, uncontrolled, with complications (CAD, PAD). Pt. previously saw Dr. Kassie, but last visit with me 1.5 mo ago.  Interim history: No increased urination,  blurry vision. She has nausea, which started few weeks ago >> lost weight.  Her CEA remains elevated.  This is considered due to her chronic pancreatitis as no malignancy has been found so far.  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  Reviewed HbA1c: Lab Results  Component Value Date   HGBA1C 6.3 (A) 08/07/2024   HGBA1C 7.4 (A) 05/05/2024   HGBA1C 6.1 (A) 01/24/2024   HGBA1C 9.5 (A) 09/27/2023   HGBA1C 7.3 (A) 05/10/2023   HGBA1C 5.6 12/18/2022   HGBA1C 7.0 (A) 08/17/2022   HGBA1C 7.0 (A) 04/20/2022   HGBA1C 6.3 (A) 01/23/2022   HGBA1C 6.8 (A) 10/24/2021  06/26/2022: HbA1c 8.5%   She was previously on: - Metformin  ER 1000 >> 500 mg 2x a day, with meals  - NovoLog  4-8 units 2x a day >> Novolog  2-6 units 2x a day 15 min before the meal If the sugars are high between meals, you can correct with NovoLog : >200: + 1 unit >300: + 2 units >400 + 3 units She had previous hypoglycemia with glipizide. She was on repaglinide  until we started NovoLog .  Currently on:  - Lantus  8 units in am - FiAsp  >> Novolog  4-8 >> 6-10 ... >> 2-3 units before large meals only >> Humalog - 4 units 15 min before a larger meal   -stopped due to low blood sugars She was previously on Lantus  8-10 and up to 20 units daily, but stopped 01/2024. She was previously on metformin  ER 500 mg twice a day but stopped in 05/2024.  Pt checks her sugars >4x a day with the Libre 2 CGM -  PDM:  Prev.:  Prev.:   Lowest blood sugars: 27!!! >> 55 >> 65; she has hypoglycemia awareness at 70.  In 12/2023, her sugars dropped to 27 (she lost consciousness) per EMS.  Afterwards, her doses of insulin  were reduced.  Highest sugar was  high 300s >> 306 >> 390.  Glucometer: Accu-Chek guide  Pt's meals - 2x a day.  - no CKD, BUN/creatinine:  Lab Results  Component Value Date   BUN 7 (L) 05/28/2024   BUN 9 12/19/2023   CREATININE 0.83 05/28/2024   CREATININE 0.99 12/19/2023   Lab Results  Component Value Date   MICRALBCREAT 5 05/05/2024  She is not on ACE inhibitor/ARB.  -+ HL; last set of lipids: 02/04/2024: 105/87/60/27 Lab Results  Component Value Date   CHOL 48 12/10/2023   HDL 20 (L) 12/10/2023   LDLCALC 15 12/10/2023   TRIG 65 12/10/2023   CHOLHDL 2.4 12/10/2023  On Mevacor 10 mg daily  - last eye exam was 06/03/2024. No DR reportedly. + Glaucoma. Dr. Milissa.  - + numbness and tingling in her feet.  Last foot exam 06/20/2024 by Dr. Lamount.  On Gabapentin .   She has a history of HTN, overactive bladder, depression/anxiety, anemia-on iron 325 mg daily. She had Whipple procedure (for what turned out to be a benign tumor) in 10/2019 and then after hospitalization  for pancreatitis 06/2022.  In 2024, she had axillo-femoral bypass graft  - she had a lot of pain.  She was also admitted with bacteremia after surgery.  She was in rehab afterwards.  On Pletal .  ROS: + see HPI  Past Medical History:  Diagnosis Date   Bronchitis    Bulging disc    in neck   Depression with anxiety    Diabetes mellitus    Type II   Fibromyalgia    pt. denies   GERD (gastroesophageal reflux disease)    Glaucoma    History of bronchitis    Hyperlipidemia    Hypertension    Overactive bladder    Peripheral vascular disease    Pneumonia    Sleep apnea    recent test negative for sleep apnea   Tobacco abuse    Urinary frequency    Past Surgical History:  Procedure Laterality  Date   ABDOMINAL AORTAGRAM N/A 08/15/2013   Procedure: ABDOMINAL EZELLA;  Surgeon: Carlin FORBES Haddock, MD;  Location: [facility] CATH LAB;  Service: Cardiovascular;  Laterality: N/A;   ABDOMINAL AORTAGRAM N/A 06/19/2014   Procedure: ABDOMINAL EZELLA;  Surgeon: Carlin FORBES Haddock, MD;  Location: [facility] CATH LAB;  Service: Cardiovascular;  Laterality: N/A;   ABDOMINAL AORTOGRAM W/LOWER EXTREMITY N/A 05/10/2018   Procedure: ABDOMINAL AORTOGRAM W/LOWER EXTREMITY;  Surgeon: Haddock Carlin FORBES, MD;  Location: [facility] INVASIVE CV LAB;  Service: Cardiovascular;  Laterality: N/A;  bilateral   ABDOMINAL AORTOGRAM W/LOWER EXTREMITY N/A 04/20/2023   Procedure: ABDOMINAL AORTOGRAM W/LOWER EXTREMITY;  Surgeon: Magda Debby SAILOR, MD;  Location: [facility] INVASIVE CV LAB;  Service: Cardiovascular;  Laterality: N/A;   Aortogram w/ PTA  05/14/08,  11-04-10   Bilateral aortogram w/ bilateral  SFA PTA  stenting    APPLICATION OF WOUND VAC Right 12/09/2023   Procedure: APPLICATION OF WOUND VAC;  Surgeon: Gretta Lonni PARAS, MD;  Location: [facility] OR;  Service: Vascular;  Laterality: Right;   AXILLARY-FEMORAL BYPASS GRAFT Right 11/15/2023   Procedure: RIGHT AXILLO-FEMORAL BYPASS GRAFT;  Surgeon: Magda Debby SAILOR, MD;  Location: [facility] OR;  Service: Vascular;  Laterality: Right;   AXILLARY-FEMORAL BYPASS GRAFT Right 12/09/2023   Procedure: EXCISION AXILLA-BIFEMORAL RIGHT;  Surgeon: Gretta Lonni PARAS, MD;  Location: [facility] OR;  Service: Vascular;  Laterality: Right;   BREAST SURGERY Right    boil removal   BUNIONECTOMY     L foot in the 1980s   COLONOSCOPY     2014   DILATION AND CURETTAGE OF UTERUS     X4   ENDARTERECTOMY FEMORAL Right 06/03/2018   Procedure: RIGHT FEMORAL ENDARTERECTOMY;  Surgeon: Haddock Carlin FORBES, MD;  Location: [facility] OR;  Service: Vascular;  Laterality: Right;   Epidural shots in neck      ESOPHAGOGASTRODUODENOSCOPY (EGD) WITH PROPOFOL  N/A 09/15/2019   Procedure: ESOPHAGOGASTRODUODENOSCOPY (EGD) WITH PROPOFOL ;  Surgeon: Burnette Fallow, MD;  Location: [facility] ENDOSCOPY;  Service: Endoscopy;  Laterality: N/A;   EUS N/A 09/15/2019   Procedure: UPPER ENDOSCOPIC ULTRASOUND (EUS) LINEAR;  Surgeon: Burnette Fallow, MD;  Location: [facility] ENDOSCOPY;  Service: Endoscopy;  Laterality: N/A;   EYE SURGERY Bilateral 2016   cataract removal   FEMORAL ARTERY EXPLORATION Right 12/09/2023   Procedure: Vein Patch Right FEMORAL ARTERY;  Surgeon: Gretta Lonni PARAS, MD;  Location: [facility] OR;  Service: Vascular;  Laterality: Right;   FEMORAL-POPLITEAL BYPASS GRAFT Left 10/21/2013   Procedure: LEFT FEMORAL-POPLITEAL ARTERY BYPASS WITH SAPHENOUS VEIN GRAFT , POPLITEAL ENDARTERECTOMY ,INTRAOPERATIVE ARTERIOGRAM, vein patch angioplasty to popliteal  artery;  Surgeon: Carlin FORBES Haddock, MD;  Location: [facility] OR;  Service: Vascular;  Laterality: Left;   FEMORAL-POPLITEAL BYPASS GRAFT Right 02/08/2016   Procedure: Right FEMORAL- to Above Knee POPLITEAL ARTERY Bypass Graft with reversed saphenous vein and Common Femoral Endarterectomy  with profundoplasty;  Surgeon: Carlin FORBES Haddock, MD;  Location: [facility] OR;  Service: Vascular;  Laterality: Right;   FINE NEEDLE ASPIRATION N/A 09/15/2019   Procedure: FINE NEEDLE ASPIRATION (FNA) LINEAR;  Surgeon: Burnette Fallow, MD;  Location: [facility] ENDOSCOPY;  Service: Endoscopy;  Laterality: N/A;   GROIN DEBRIDEMENT Left 10/28/2013   Procedure: left inner thigh DEBRIDEMENT;  Surgeon: Carlin FORBES Haddock, MD;  Location: [facility] OR;  Service: Vascular;  Laterality: Left;   HAND SURGERY Left 2010   IR FLUORO GUIDE CV LINE RIGHT  12/14/2023   IR REMOVAL TUN CV CATH W/O FL  01/04/2024   IR US  GUIDE VASC ACCESS RIGHT  12/14/2023   LOWER EXTREMITY ANGIOGRAM Bilateral 12/24/2015   Procedure: Lower Extremity Angiogram;  Surgeon: Carlin FORBES Haddock, MD;  Location: [facility] INVASIVE CV LAB;  Service: Cardiovascular;  Laterality: Bilateral;   PATCH ANGIOPLASTY Right 06/03/2018   Procedure: PATCH ANGIOPLASTY USING A XENOSURE 1CM X 14CM BIOLOGIC PATCH;  Surgeon: Haddock Carlin FORBES, MD;  Location: [facility] OR;  Service: Vascular;  Laterality: Right;   PERIPHERAL VASCULAR CATHETERIZATION N/A 12/24/2015   Procedure: Abdominal Aortogram;  Surgeon: Carlin FORBES Haddock, MD;  Location: [facility] INVASIVE CV LAB;  Service: Cardiovascular;  Laterality: N/A;   PERIPHERAL VASCULAR CATHETERIZATION Left 12/24/2015   Procedure: Peripheral Vascular Balloon Angioplasty;  Surgeon: Carlin FORBES Haddock, MD;  Location: [facility] INVASIVE CV LAB;  Service: Cardiovascular;  Laterality: Left;  drug coated balloon   PERIPHERAL VASCULAR CATHETERIZATION N/A 06/30/2016   Procedure: Abdominal Aortogram;  Surgeon: Carlin FORBES Haddock, MD;  Location: [facility] INVASIVE CV LAB;  Service: Cardiovascular;  Laterality: N/A;   PERIPHERAL VASCULAR CATHETERIZATION Bilateral 06/30/2016   Procedure: Lower Extremity Angiography;  Surgeon: Carlin FORBES Haddock, MD;  Location: [facility] INVASIVE CV LAB;  Service: Cardiovascular;  Laterality: Bilateral;   TONSILLECTOMY     TRANSESOPHAGEAL ECHOCARDIOGRAM (CATH LAB) N/A 12/13/2023   Procedure: TRANSESOPHAGEAL ECHOCARDIOGRAM;  Surgeon: Raford Riggs, MD;  Location: [facility] INVASIVE CV LAB;  Service: Cardiovascular;  Laterality: N/A;   VEIN HARVEST Left 12/09/2023   Procedure: BACILIC VEIN HARVEST LEFT ARM;  Surgeon: Gretta Lonni PARAS, MD;  Location: [facility] OR;  Service: Vascular;  Laterality: Left;   VEIN REPAIR Right 12/09/2023   Procedure: VEIN  PATCH RIGHT AXILLIA ARTERY RIGHT;  Surgeon: Gretta Lonni PARAS, MD;  Location: [facility] OR;  Service: Vascular;  Laterality: Right;   Social History   Socioeconomic History   Marital status: Single    Spouse name: Not on file   Number of children: 1   Years of education: Not on file   Highest education level: Not on file  Occupational History   Occupation: Retired    Associate Professor: SCIENTIST, [employer]  Tobacco Use   Smoking status: Former    Current packs/day: 0.25    Average packs/day: 0.3 packs/day for 1.3 years (0.3 ttl pk-yrs)    Types: Cigarettes    Start date: 06/2023     Quit date: 10/2014    Passive exposure: Never   Smokeless tobacco: Never   Tobacco comments:    pt states that she is using the E cig only   Vaping Use   Vaping status: Some Days  Substance and Sexual Activity   Alcohol use: Yes  Alcohol/week: 0.0 - 1.0 standard drinks of alcohol    Comment: Occasional; 10-22 rarely    Drug use: No   Sexual activity: Not on file  Other Topics Concern   Not on file  Social History Narrative   Drinks about 2 cups of coffee a day, and some tea    Social Drivers of Corporate Investment Banker Strain: Not on file  Food Insecurity: No Food Insecurity (12/11/2023)   Hunger Vital Sign    Worried About Running Out of Food in the Last Year: Never true    Ran Out of Food in the Last Year: Never true  Transportation Needs: No Transportation Needs (12/11/2023)   PRAPARE - Administrator, Civil Service (Medical): No    Lack of Transportation (Non-Medical): No  Physical Activity: Not on file  Stress: Not on file  Social Connections: Moderately Isolated (12/11/2023)   Social Connection and Isolation Panel    Frequency of Communication with Friends and Family: More than three times a week    Frequency of Social Gatherings with Friends and Family: More than three times a week    Attends Religious Services: 1 to 4 times per year    Active Member of [employer] or Organizations: No    Attends Banker Meetings: Never    Marital Status: Divorced  Catering Manager Violence: Not At Risk (12/11/2023)   Humiliation, Afraid, Rape, and Kick questionnaire    Fear of Current or Ex-Partner: No    Emotionally Abused: No    Physically Abused: No    Sexually Abused: No   Current Outpatient Medications on File Prior to Visit  Medication Sig Dispense Refill   amLODipine  (NORVASC ) 5 MG tablet Take 5 mg by mouth daily.     amoxicillin -clavulanate (AUGMENTIN ) 875-125 MG tablet Take 1 tablet by mouth 2 (two) times daily. 20 tablet 0   apixaban  (ELIQUIS ) 5  MG TABS tablet Take 1 tablet (5 mg total) by mouth 2 (two) times daily. 60 tablet 11   APIXABAN  (ELIQUIS ) VTE STARTER PACK (10MG  AND 5MG ) Take as directed on package: start with two-5mg  tablets twice daily for 7 days. On day 8, switch to one-5mg  tablet twice daily. 74 each 0   aspirin  EC 81 MG tablet Take 81 mg by mouth at bedtime.     atorvastatin  (LIPITOR) 80 MG tablet Take 1 tablet (80 mg total) by mouth daily. 30 tablet 11   Blood Glucose Monitoring Suppl (ONETOUCH VERIO FLEX SYSTEM) w/Device KIT USE TO CHECK BLOOD SUGAR 3-4 TIMES DAY 1 kit 0   brimonidine (ALPHAGAN) 0.2 % ophthalmic solution 1 drop 3 (three) times daily.     cilostazol  (PLETAL ) 100 MG tablet Take 1 tablet (100 mg total) by mouth 2 (two) times daily before a meal. 60 tablet 11   Continuous Glucose Receiver (FREESTYLE LIBRE 3 READER) DEVI Use to monitor glucose continuously. 1 each 0   Continuous Glucose Sensor (FREESTYLE LIBRE 3 PLUS SENSOR) MISC Inject 1 Device into the skin continuous. Change every 15 days 6 each 3   diclofenac  Sodium (VOLTAREN ) 1 % GEL Apply 1 application. topically daily as needed (leg pain).     DULoxetine  (CYMBALTA ) 60 MG capsule Take 60 mg by mouth daily.     ferrous sulfate  325 (65 FE) MG EC tablet Take 325 mg by mouth daily.     gabapentin  (NEURONTIN ) 300 MG capsule Take 1 capsule (300 mg total) by mouth 3 (three) times daily. 90 capsule 0  glucose blood (ONETOUCH VERIO) test strip Use to check blood sugar 3-4 times a day 400 each 5   insulin  glargine (LANTUS ) 100 UNIT/ML Solostar Pen Inject 8 Units into the skin daily. 3 mL 2   insulin  lispro (HUMALOG) 100 UNIT/ML KwikPen Inject 2-4 Units into the skin 3 (three) times daily. 10 mL 3   Insulin  Pen Needle 32G X 4 MM MISC Use 4x a day 300 each 3   latanoprost  (XALATAN ) 0.005 % ophthalmic solution Place 1 drop into both eyes at bedtime.     lipase/protease/amylase 24000-76000 units CPEP Take 1 capsule (24,000 Units total) by mouth 3 (three) times daily  before meals. (Patient taking differently: Take 24,000-48,000 Units by mouth See admin instructions. Take 2 capsules before meals and 1 capsule after meals.) 270 capsule 0   MAGNESIUM  PO Take 1 tablet by mouth daily.     Multiple Vitamins-Minerals (CENTRUM SILVER  50+WOMEN) TABS Take 1 tablet by mouth daily.     mupirocin  ointment (BACTROBAN ) 2 % Apply 1 Application topically 2 (two) times daily. Right 1st toe medial border 22 g 0   ondansetron  (ZOFRAN -ODT) 4 MG disintegrating tablet Take 1 tablet (4 mg total) by mouth every 8 (eight) hours as needed for vomiting. 30 tablet 0   OneTouch Delica Lancets 33G MISC Check blood sugar 3-4 times a day 400 each 5   oxybutynin  (DITROPAN -XL) 10 MG 24 hr tablet Take 10 mg by mouth daily.     pantoprazole  (PROTONIX ) 40 MG tablet Take 40 mg by mouth every evening.     polyethylene glycol (MIRALAX  / GLYCOLAX ) 17 g packet Take 17 g by mouth daily as needed for mild constipation.     senna-docusate (SENOKOT-S) 8.6-50 MG tablet Take 1 tablet by mouth daily.     silver  sulfADIAZINE  (SILVADENE ) 1 % cream Apply 1 Application topically daily. 50 g 0   temazepam  (RESTORIL ) 30 MG capsule Take 1 capsule (30 mg total) by mouth at bedtime as needed for sleep. 30 capsule 0   timolol  (TIMOPTIC ) 0.5 % ophthalmic solution Place 1 drop into both eyes every morning.     traMADol  (ULTRAM ) 50 MG tablet Take 2 tablets (100 mg total) by mouth 3 (three) times daily as needed. 90 tablet 1   traZODone  (DESYREL ) 50 MG tablet Take 75 mg by mouth at bedtime as needed for sleep.     No current facility-administered medications on file prior to visit.   Allergies  Allergen Reactions   Wound Dressing Adhesive Rash and Other (See Comments)    Pt states that tape and electrodes leave red scars on her skin and her skin is very sensitive.  Paper tape ok to use   Family History  Problem Relation Age of Onset   Hypertension Mother    Alzheimer's disease Mother    Heart disease Father     Heart attack Father        MI at age 7   Hypertension Sister    Diabetes Brother    Hyperlipidemia Brother    Hypertension Brother    Heart disease Brother    Heart attack Brother    Breast cancer Maternal Aunt    PE: BP 122/70   Pulse 77   Ht 5' 3 (1.6 m)   Wt 103 lb 3.2 oz (46.8 kg)   SpO2 95%   BMI 18.28 kg/m  Wt Readings from Last 3 Encounters:  09/22/24 103 lb 3.2 oz (46.8 kg)  08/07/24 99 lb 6.4 oz (45.1 kg)  07/28/24 101 lb 6.4 oz (46 kg)   Constitutional: thin, in NAD Eyes:  EOMI, no exophthalmos ENT: no neck masses, no cervical lymphadenopathy Cardiovascular: RRR, No MRG Respiratory: CTA B Musculoskeletal: no deformities Skin:no rashes Neurological: no tremor with outstretched hands  1. DM2, insulin -dependent, controlled, with complications - CAD - PAD - s/p stents  Component     Latest Ref Rng 04/20/2022  Glucose     65 - 99 mg/dL 859 (H)   C-Peptide     0.80 - 3.85 ng/mL 1.50     2.  Hyperlipidemia  PLAN:  1.  Very complex diabetic patient with longstanding, uncontrolled, insulin -dependent diabetes, previously on oral antidiabetic regimen along with basal/bolus insulin , but basal insulin  was stopped last year due to many lows.  She had a blood sugar of 27, during which she lost consciousness.  HbA1c in 09/2023 was higher, at 9.5%, but then improved and at last visit, this was 6.3%.  At that time, sugars were more fluctuating than before, with still low blood sugars at different times of the day but significant variability after lunch.  Upon questioning, she was giving her NovoLog  in the fridge and not taking it when she was eating out.  Also, some of the blood sugars were low after meals due to taking insulin  too late after she already started to eat.  Upon questioning, she was also not using the recommended 2 to 3 units of NovoLog  before larger meals but was using a sliding scale and guiding her insulin  doses based on sugars before meals and not the actual  meals.   -However, at last visit, sugars appears to be at goal during the day with higher values after lunch.  They were improving afterwards, but increasing again in the second half of the night with a peak around 4 AM.  Upon questioning, she was off metformin  for 2 months.  She had nausea at that time so I did not recommend to start it back.  She was still taking a lower dose of NovoLog  then recommended, mostly 2 units before the meals and I advised her to increase the dose to 3 and possibly also 4 units before a larger meal.  She was not eating very well and we discussed about improving diet. CGM interpretation: -At today's visit, we reviewed her CGM downloads: It appears that 39% of values are in target range (goal >70%), while 55% are higher than 180 (goal <25%), and 6% are lower than 70 (goal <4%).  The calculated average blood sugar is 196.  The projected HbA1c for the next 3 months (GMI) is 8.0%. -Reviewing the CGM trends, sugars appear to be extremely fluctuating, in a sawtooth pattern most of the time, which sugars in the 300s and also down to the 40s.  There is no consistent pattern.  It does appear that her sugars are almost consistently increasing after meals, and then dropping too low after the initial postprandial peak.  She mentions that she is not usually doing corrections, but apparently she is using a sliding scale.  She mentions she is injecting Humalog 15 minutes before meals.  She is using the same dose of insulin  before meals and snacks.  She is also using Lantus  at 8 units daily.  It is quite difficult for me to understand exactly what she might be doing wrong, but we discussed at today's visit about reducing the dose of Lantus  to hopefully prevent further lows, also, to vary the dose of mealtime insulin  depending  on the size of her meals, and we will also try to switch from Humalog to Lyumjev which is injected at the start of the meals.  I called in a prescription for the intranasal  glucagon to her pharmacy and explained when and how she should use it.  She lives alone. - I suggested to:  Patient Instructions  Please change: - Lyumjev at the beginning of the meals - 4-5 units before meals  - Lantus  6 units daily   For correction, do no take more than 2 units.  Please return in 1.5 months.  - we will repeat your HbA1c at next visit - advised to check sugars at different times of the day - 4x a day, rotating check times - advised for yearly eye exams >> she is UTD - return to clinic in 1.5 months  2. HL - Reviewed latest lipid panel from 01/2024: Fractions at goal.  Her low HDL improved significantly at last check. - Continues Lovastatin 40 mg daily without side effects.  Lela Fendt, MD PhD [facility]

## 2024-10-03 ENCOUNTER — Telehealth: Payer: Self-pay

## 2024-10-03 NOTE — Telephone Encounter (Signed)
 Patient called stating that she is still having some Highs in the 200s and 300s  She has been taking 4 units of Lyumjev before her meals.  And 6 units of Lantus .   I spoke with Dr. Trixie and she states that Jill Higgins needs to take 8 units of Lantus  and up to 6 units of Lyumjev before her meals.

## 2024-10-07 ENCOUNTER — Ambulatory Visit
Admission: RE | Admit: 2024-10-07 | Discharge: 2024-10-07 | Disposition: A | Discharge status: Home / Self Care | Source: Ambulatory Visit | Attending: Family Medicine | Admitting: Family Medicine

## 2024-10-07 DIAGNOSIS — Z1231 Encounter for screening mammogram for malignant neoplasm of breast: Secondary | ICD-10-CM

## 2024-10-13 NOTE — Progress Notes (Unsigned)
 HISTORY AND PHYSICAL     CC:  follow up. Requesting Provider:  Claudene Pellet, MD  HPI: This is a 78 y.o. female who is here today for follow up for PAD.  Pt has hx of Redo exposure of right axillary artery with excision of right axillary artery to femoral artery bypass, vein patch angioplasty right axillary artery, vein patch angioplasty of right femoral artery distal bypass and vac right groin for infected right axillary artery to femoral bypass graft on 12/09/2023 by Dr. Gretta.   In the past patient has a history of left common femoral endarterectomy with right femoral to above-knee popliteal bypass with vein by Dr. Harvey. She also had left femoral to AK bypass with vein by Dr. Harvey on 10/21/2013.  More recently on 04/20/2023 patient underwent stenting of the right iliac and common femoral arteries by Dr. Magda.  The stents occluded and she underwent the right axillary femoral bypass on 11/15/2023 with 6 mm ringed PTFE   She has hx of exploratory laparotomy with pylorus preserving Whipple procedure on 10/22/2019 at [facility].   Pt was last seen 04/22/2024 by Dr. Magda and at that time, she was having very short distance claudication.  He felt that open vascular surgery was not a good idea and referred her to pain management and started her on Pletal .  She was not having rest pain or non healing wounds.   The pt returns today for follow up.  She states that she does have abdominal pain but is not constant.  It is not related to eating.  She has actually gained about 7 lbs.  She does not have any fear of food.   She states that she still has pain in the right foot. She can only take about 20 steps before she starts cramping.  She does not have any non healing wounds.    The pt is on a statin for cholesterol management.    The pt is on an aspirin .    Other AC:  Eliquis  The pt is on CCB for hypertension.  The pt is  on diabetic medication. Tobacco hx:  former    Past Medical History:   Diagnosis Date   Bronchitis    Bulging disc    in neck   Depression with anxiety    Diabetes mellitus    Type II   Fibromyalgia    pt. denies   GERD (gastroesophageal reflux disease)    Glaucoma    History of bronchitis    Hyperlipidemia    Hypertension    Overactive bladder    Peripheral vascular disease    Pneumonia    Sleep apnea    recent test negative for sleep apnea   Tobacco abuse    Urinary frequency     Past Surgical History:  Procedure Laterality Date   ABDOMINAL AORTAGRAM N/A 08/15/2013   Procedure: ABDOMINAL EZELLA;  Surgeon: Carlin FORBES Harvey, MD;  Location: [facility] CATH LAB;  Service: Cardiovascular;  Laterality: N/A;   ABDOMINAL AORTAGRAM N/A 06/19/2014   Procedure: ABDOMINAL EZELLA;  Surgeon: Carlin FORBES Harvey, MD;  Location: [facility] CATH LAB;  Service: Cardiovascular;  Laterality: N/A;   ABDOMINAL AORTOGRAM W/LOWER EXTREMITY N/A 05/10/2018   Procedure: ABDOMINAL AORTOGRAM W/LOWER EXTREMITY;  Surgeon: Harvey Carlin FORBES, MD;  Location: [facility] INVASIVE CV LAB;  Service: Cardiovascular;  Laterality: N/A;  bilateral   ABDOMINAL AORTOGRAM W/LOWER EXTREMITY N/A 04/20/2023   Procedure: ABDOMINAL AORTOGRAM W/LOWER EXTREMITY;  Surgeon: Magda Debby SAILOR, MD;  Location: [facility]  INVASIVE CV LAB;  Service: Cardiovascular;  Laterality: N/A;   Aortogram w/ PTA  05/14/08,  11-04-10   Bilateral aortogram w/ bilateral  SFA PTA  stenting    APPLICATION OF WOUND VAC Right 12/09/2023   Procedure: APPLICATION OF WOUND VAC;  Surgeon: Gretta Lonni PARAS, MD;  Location: [facility] OR;  Service: Vascular;  Laterality: Right;   AXILLARY-FEMORAL BYPASS GRAFT Right 11/15/2023   Procedure: RIGHT AXILLO-FEMORAL BYPASS GRAFT;  Surgeon: Magda Debby SAILOR, MD;  Location: [facility] OR;  Service: Vascular;  Laterality: Right;   AXILLARY-FEMORAL BYPASS GRAFT Right 12/09/2023   Procedure: EXCISION AXILLA-BIFEMORAL RIGHT;  Surgeon: Gretta Lonni PARAS, MD;  Location: [facility] OR;  Service: Vascular;  Laterality: Right;   BREAST SURGERY  Right    boil removal   BUNIONECTOMY     L foot in the 1980s   COLONOSCOPY     2014   DILATION AND CURETTAGE OF UTERUS     X4   ENDARTERECTOMY FEMORAL Right 06/03/2018   Procedure: RIGHT FEMORAL ENDARTERECTOMY;  Surgeon: Harvey Carlin BRAVO, MD;  Location: [facility] OR;  Service: Vascular;  Laterality: Right;   Epidural shots in neck      ESOPHAGOGASTRODUODENOSCOPY (EGD) WITH PROPOFOL  N/A 09/15/2019   Procedure: ESOPHAGOGASTRODUODENOSCOPY (EGD) WITH PROPOFOL ;  Surgeon: Burnette Fallow, MD;  Location: [facility] ENDOSCOPY;  Service: Endoscopy;  Laterality: N/A;   EUS N/A 09/15/2019   Procedure: UPPER ENDOSCOPIC ULTRASOUND (EUS) LINEAR;  Surgeon: Burnette Fallow, MD;  Location: [facility] ENDOSCOPY;  Service: Endoscopy;  Laterality: N/A;   EYE SURGERY Bilateral 2016   cataract removal   FEMORAL ARTERY EXPLORATION Right 12/09/2023   Procedure: Vein Patch Right FEMORAL ARTERY;  Surgeon: Gretta Lonni PARAS, MD;  Location: [facility] OR;  Service: Vascular;  Laterality: Right;   FEMORAL-POPLITEAL BYPASS GRAFT Left 10/21/2013   Procedure: LEFT FEMORAL-POPLITEAL ARTERY BYPASS WITH SAPHENOUS VEIN GRAFT , POPLITEAL ENDARTERECTOMY ,INTRAOPERATIVE ARTERIOGRAM, vein patch angioplasty to popliteal artery;  Surgeon: Carlin BRAVO Harvey, MD;  Location: [facility] OR;  Service: Vascular;  Laterality: Left;   FEMORAL-POPLITEAL BYPASS GRAFT Right 02/08/2016   Procedure: Right FEMORAL- to Above Knee POPLITEAL ARTERY Bypass Graft with reversed saphenous vein and Common Femoral Endarterectomy  with profundoplasty;  Surgeon: Carlin BRAVO Harvey, MD;  Location: [facility] OR;  Service: Vascular;  Laterality: Right;   FINE NEEDLE ASPIRATION N/A 09/15/2019   Procedure: FINE NEEDLE ASPIRATION (FNA) LINEAR;  Surgeon: Burnette Fallow, MD;  Location: [facility] ENDOSCOPY;  Service: Endoscopy;  Laterality: N/A;   GROIN DEBRIDEMENT Left 10/28/2013   Procedure: left inner thigh DEBRIDEMENT;  Surgeon: Carlin BRAVO Harvey, MD;  Location: [facility] OR;  Service: Vascular;  Laterality: Left;   HAND  SURGERY Left 2010   IR FLUORO GUIDE CV LINE RIGHT  12/14/2023   IR REMOVAL TUN CV CATH W/O FL  01/04/2024   IR US  GUIDE VASC ACCESS RIGHT  12/14/2023   LOWER EXTREMITY ANGIOGRAM Bilateral 12/24/2015   Procedure: Lower Extremity Angiogram;  Surgeon: Carlin BRAVO Harvey, MD;  Location: [facility] INVASIVE CV LAB;  Service: Cardiovascular;  Laterality: Bilateral;   PATCH ANGIOPLASTY Right 06/03/2018   Procedure: PATCH ANGIOPLASTY USING A XENOSURE 1CM X 14CM BIOLOGIC PATCH;  Surgeon: Harvey Carlin BRAVO, MD;  Location: [facility] OR;  Service: Vascular;  Laterality: Right;   PERIPHERAL VASCULAR CATHETERIZATION N/A 12/24/2015   Procedure: Abdominal Aortogram;  Surgeon: Carlin BRAVO Harvey, MD;  Location: [facility] INVASIVE CV LAB;  Service: Cardiovascular;  Laterality: N/A;   PERIPHERAL VASCULAR CATHETERIZATION Left 12/24/2015   Procedure: Peripheral Vascular Balloon Angioplasty;  Surgeon: Carlin  FORBES Haddock, MD;  Location: [facility] INVASIVE CV LAB;  Service: Cardiovascular;  Laterality: Left;  drug coated balloon   PERIPHERAL VASCULAR CATHETERIZATION N/A 06/30/2016   Procedure: Abdominal Aortogram;  Surgeon: Carlin FORBES Haddock, MD;  Location: [facility] INVASIVE CV LAB;  Service: Cardiovascular;  Laterality: N/A;   PERIPHERAL VASCULAR CATHETERIZATION Bilateral 06/30/2016   Procedure: Lower Extremity Angiography;  Surgeon: Carlin FORBES Haddock, MD;  Location: [facility] INVASIVE CV LAB;  Service: Cardiovascular;  Laterality: Bilateral;   TONSILLECTOMY     TRANSESOPHAGEAL ECHOCARDIOGRAM (CATH LAB) N/A 12/13/2023   Procedure: TRANSESOPHAGEAL ECHOCARDIOGRAM;  Surgeon: Raford Riggs, MD;  Location: [facility] INVASIVE CV LAB;  Service: Cardiovascular;  Laterality: N/A;   VEIN HARVEST Left 12/09/2023   Procedure: BACILIC VEIN HARVEST LEFT ARM;  Surgeon: Gretta Lonni PARAS, MD;  Location: [facility] OR;  Service: Vascular;  Laterality: Left;   VEIN REPAIR Right 12/09/2023   Procedure: VEIN  PATCH RIGHT AXILLIA ARTERY RIGHT;  Surgeon: Gretta Lonni PARAS, MD;  Location: [facility] OR;  Service:  Vascular;  Laterality: Right;    Allergies  Allergen Reactions   Wound Dressing Adhesive Rash and Other (See Comments)    Pt states that tape and electrodes leave red scars on her skin and her skin is very sensitive.  Paper tape ok to use    Current Outpatient Medications  Medication Sig Dispense Refill   amLODipine  (NORVASC ) 5 MG tablet Take 5 mg by mouth daily.     amoxicillin -clavulanate (AUGMENTIN ) 875-125 MG tablet Take 1 tablet by mouth 2 (two) times daily. 20 tablet 0   apixaban  (ELIQUIS ) 5 MG TABS tablet Take 1 tablet (5 mg total) by mouth 2 (two) times daily. 60 tablet 11   APIXABAN  (ELIQUIS ) VTE STARTER PACK (10MG  AND 5MG ) Take as directed on package: start with two-5mg  tablets twice daily for 7 days. On day 8, switch to one-5mg  tablet twice daily. 74 each 0   aspirin  EC 81 MG tablet Take 81 mg by mouth at bedtime.     atorvastatin  (LIPITOR) 80 MG tablet Take 1 tablet (80 mg total) by mouth daily. 30 tablet 11   Blood Glucose Monitoring Suppl (ONETOUCH VERIO FLEX SYSTEM) w/Device KIT USE TO CHECK BLOOD SUGAR 3-4 TIMES DAY 1 kit 0   brimonidine (ALPHAGAN) 0.2 % ophthalmic solution 1 drop 3 (three) times daily.     cilostazol  (PLETAL ) 100 MG tablet Take 1 tablet (100 mg total) by mouth 2 (two) times daily before a meal. 60 tablet 11   Continuous Glucose Receiver (FREESTYLE LIBRE 3 READER) DEVI Use to monitor glucose continuously. 1 each 0   Continuous Glucose Sensor (FREESTYLE LIBRE 3 PLUS SENSOR) MISC Inject 1 Device into the skin continuous. Change every 15 days 6 each 3   diclofenac  Sodium (VOLTAREN ) 1 % GEL Apply 1 application. topically daily as needed (leg pain).     DULoxetine  (CYMBALTA ) 60 MG capsule Take 60 mg by mouth daily.     ferrous sulfate  325 (65 FE) MG EC tablet Take 325 mg by mouth daily.     gabapentin  (NEURONTIN ) 300 MG capsule Take 1 capsule (300 mg total) by mouth 3 (three) times daily. 90 capsule 0   Glucagon  3 MG/DOSE POWD Place 3 mg into the nose once as  needed for up to 1 dose. 1 each 11   glucose blood (ONETOUCH VERIO) test strip Use to check blood sugar 3-4 times a day 400 each 5   insulin  glargine (LANTUS ) 100 UNIT/ML Solostar Pen Inject 8 Units into the  skin daily. 3 mL 2   insulin  lispro (HUMALOG ) 100 UNIT/ML KwikPen Inject 2-4 Units into the skin 3 (three) times daily. 10 mL 3   Insulin  Lispro-aabc (LYUMJEV  KWIKPEN) 100 UNIT/ML KwikPen Inject 4-5 Units into the skin 3 (three) times daily before meals. 15 mL 3   Insulin  Pen Needle 32G X 4 MM MISC Use to inject insulin  4x a day 400 each 12   latanoprost  (XALATAN ) 0.005 % ophthalmic solution Place 1 drop into both eyes at bedtime.     lipase/protease/amylase 24000-76000 units CPEP Take 1 capsule (24,000 Units total) by mouth 3 (three) times daily before meals. (Patient taking differently: Take 24,000-48,000 Units by mouth See admin instructions. Take 2 capsules before meals and 1 capsule after meals.) 270 capsule 0   MAGNESIUM  PO Take 1 tablet by mouth daily.     Multiple Vitamins-Minerals (CENTRUM SILVER  50+WOMEN) TABS Take 1 tablet by mouth daily.     mupirocin  ointment (BACTROBAN ) 2 % Apply 1 Application topically 2 (two) times daily. Right 1st toe medial border 22 g 0   ondansetron  (ZOFRAN -ODT) 4 MG disintegrating tablet Take 1 tablet (4 mg total) by mouth every 8 (eight) hours as needed for vomiting. 30 tablet 0   OneTouch Delica Lancets 33G MISC Check blood sugar 3-4 times a day 400 each 5   oxybutynin  (DITROPAN -XL) 10 MG 24 hr tablet Take 10 mg by mouth daily.     pantoprazole  (PROTONIX ) 40 MG tablet Take 40 mg by mouth every evening.     polyethylene glycol (MIRALAX  / GLYCOLAX ) 17 g packet Take 17 g by mouth daily as needed for mild constipation.     senna-docusate (SENOKOT-S) 8.6-50 MG tablet Take 1 tablet by mouth daily.     silver  sulfADIAZINE  (SILVADENE ) 1 % cream Apply 1 Application topically daily. 50 g 0   temazepam  (RESTORIL ) 30 MG capsule Take 1 capsule (30 mg total) by mouth  at bedtime as needed for sleep. 30 capsule 0   timolol  (TIMOPTIC ) 0.5 % ophthalmic solution Place 1 drop into both eyes every morning.     traMADol  (ULTRAM ) 50 MG tablet Take 2 tablets (100 mg total) by mouth 3 (three) times daily as needed. 90 tablet 1   traZODone  (DESYREL ) 50 MG tablet Take 75 mg by mouth at bedtime as needed for sleep.     No current facility-administered medications for this visit.    Family History  Problem Relation Age of Onset   Hypertension Mother    Alzheimer's disease Mother    Heart disease Father    Heart attack Father        MI at age 55   Hypertension Sister    Diabetes Brother    Hyperlipidemia Brother    Hypertension Brother    Heart disease Brother    Heart attack Brother    Breast cancer Maternal Aunt     Social History   Socioeconomic History   Marital status: Single    Spouse name: Not on file   Number of children: 1   Years of education: Not on file   Highest education level: Not on file  Occupational History   Occupation: Retired    Associate Professor: SCIENTIST, [employer]  Tobacco Use   Smoking status: Former    Current packs/day: 0.25    Average packs/day: 0.3 packs/day for 1.3 years (0.3 ttl pk-yrs)    Types: Cigarettes    Start date: 06/2023    Quit date: 10/2014    Passive exposure: Never  Smokeless tobacco: Never   Tobacco comments:    pt states that she is using the E cig only   Vaping Use   Vaping status: Some Days  Substance and Sexual Activity   Alcohol use: Yes    Alcohol/week: 0.0 - 1.0 standard drinks of alcohol    Comment: Occasional; 10-22 rarely    Drug use: No   Sexual activity: Not on file  Other Topics Concern   Not on file  Social History Narrative   Drinks about 2 cups of coffee a day, and some tea    Social Drivers of Corporate Investment Banker Strain: Not on file  Food Insecurity: No Food Insecurity (12/11/2023)   Hunger Vital Sign    Worried About Running Out of Food in the Last Year: Never true    Ran Out of Food  in the Last Year: Never true  Transportation Needs: No Transportation Needs (12/11/2023)   PRAPARE - Administrator, Civil Service (Medical): No    Lack of Transportation (Non-Medical): No  Physical Activity: Not on file  Stress: Not on file  Social Connections: Moderately Isolated (12/11/2023)   Social Connection and Isolation Panel    Frequency of Communication with Friends and Family: More than three times a week    Frequency of Social Gatherings with Friends and Family: More than three times a week    Attends Religious Services: 1 to 4 times per year    Active Member of [employer] or Organizations: No    Attends Banker Meetings: Never    Marital Status: Divorced  Catering Manager Violence: Not At Risk (12/11/2023)   Humiliation, Afraid, Rape, and Kick questionnaire    Fear of Current or Ex-Partner: No    Emotionally Abused: No    Physically Abused: No    Sexually Abused: No     REVIEW OF SYSTEMS:   [X]  denotes positive finding, [ ]  denotes negative finding Cardiac  Comments:  Chest pain or chest pressure:    Shortness of breath upon exertion:    Short of breath when lying flat:    Irregular heart rhythm:        Vascular    Pain in calf, thigh, or hip brought on by ambulation:    Pain in feet at night that wakes you up from your sleep:     Blood clot in your veins:    Leg swelling:         Pulmonary    Oxygen at home:    Productive cough:     Wheezing:         Neurologic    Sudden weakness in arms or legs:     Sudden numbness in arms or legs:     Sudden onset of difficulty speaking or slurred speech:    Temporary loss of vision in one eye:     Problems with dizziness:         Gastrointestinal    Blood in stool:     Vomited blood:         Genitourinary    Burning when urinating:     Blood in urine:        Psychiatric    Major depression:         Hematologic    Bleeding problems:    Problems with blood clotting too easily:         Skin    Rashes or ulcers:  Constitutional    Fever or chills:      PHYSICAL EXAMINATION:  Today's Vitals   10/14/24 0921  BP: (!) 146/83  Pulse: 78  Temp: 97.9 F (36.6 C)  Weight: 111 lb (50.3 kg)  Height: 5' 3 (1.6 m)   Body mass index is 19.66 kg/m.   General:  WDWN in NAD; vital signs documented above Gait: Not observed HENT: WNL, normocephalic Pulmonary: normal non-labored breathing , without wheezing Cardiac: regular HR, without carotid bruits Abdomen: soft, NT; aortic pulse is not palpable Skin: without rashes Vascular Exam/Pulses: Monophasic bilateral DP/PT doppler flow Extremities: without ischemic changes, without Gangrene , without cellulitis; without open wounds Musculoskeletal: no muscle wasting or atrophy  Neurologic: A&O X 3 Psychiatric:  The pt has Normal affect.   Non-Invasive Vascular Imaging:   Arterial duplex on 10/14/2024: Duplex Findings:  +----------------------+--------+--------+------+--------------------------  Mesenteric           PSV cm/sEDV cm/sPlaque           Comments        +----------------------+--------+--------+------+--------------------------   Aorta at Celiac          42      14                                    +----------------------+--------+--------+------+--------------------------  Aorta Prox               69                                            +----------------------+--------+--------+------+--------------------------  Celiac Artery Origin    238                                            +----------------------+--------+--------+------+--------------------------  Celiac Artery Proximal  227      44          post stenotic turbulence  noted in distal celiac    +----------------------+--------+--------+------+--------------------------  SMA Origin              152      33                                     +----------------------+--------+--------+------+--------------------------   SMA Proximal            201      33                                    +----------------------+--------+--------+------+--------------------------  SMA Mid                 424      95                                    +----------------------+--------+--------+------+--------------------------  SMA Distal              213      23  turbulent        +----------------------+--------+--------+------+--------------------------  CHA                     77      12                   turbulent        +----------------------+--------+--------+------+--------------------------  Splenic                 91      15                                    +----------------------+--------+--------+------+--------------------------  IMA                    340      37                                    +----------------------+--------+--------+------+--------------------------   Summary:  Mesenteric:  70 to 99% stenosis in the celiac artery and superior mesenteric artery.  The Inferior Mesenteric artery appears stenotic.  Post-stenotic turbulence noted in the distal celiac artery, as well as the hepatic artery and distal superior mesenteric artery.   Previous ABI's/TBI's on 04/22/2024: Right:  0.39/0.28 - Great toe pressure: 36 Left:  1.13/0.58 - Great toe pressure:  74  Previous arterial duplex on 09/21/2023: Patent right CIA/stent with no stenosis. The EIA/stent appears occluded  with a collateral vessel present.     ASSESSMENT/PLAN:: 78 y.o. female here for follow up for PAD with hx of Redo exposure of right axillary artery with excision of right axillary artery to femoral artery bypass, vein patch angioplasty right axillary artery, vein patch angioplasty of right femoral artery distal bypass and vac right groin for infected right axillary artery to femoral bypass graft on 12/09/2023  by Dr. Gretta.   In the past patient has a history of left common femoral endarterectomy with right femoral to above-knee popliteal bypass with vein by Dr. Harvey. She also had left femoral to AK bypass with vein by Dr. Harvey on 10/21/2013.  More recently on 04/20/2023 patient underwent stenting of the right iliac and common femoral arteries by Dr. Magda.  The stents occluded and she underwent the right axillary femoral bypass on 11/15/2023 with 6 mm ringed PTFE    -pt with intermittent abdominal pain that is not necessarily associated with eating.  She has had a 7 lb weight gain and does not have a fear of food.  Duplex does reveal elevated velocities in the SMA and IMA.  Given her abdominal pain is not necessarily associated with food and she has had some weight gain, will have Dr. Magda review her u/s when she comes back to see him next month for her lower extremity studies.  Of note, she has hx of laparotomy with Whipple procedure in 2020.  -continue asa/statin and Eliquis  -she does continue have very short distance claudication in the right foot.  She has monophasic doppler flow in the right foot.  discussed importance of increased walking daily -discussed importance of smoking cessation.  She is working on quitting.  -keep appt with Dr. Magda 11/18/2024   Lucie Apt, [facility] [phone]  Clinic MD:   Gretta

## 2024-10-14 ENCOUNTER — Encounter: Payer: Self-pay | Admitting: Physician Assistant

## 2024-10-14 ENCOUNTER — Ambulatory Visit (HOSPITAL_COMMUNITY)
Admission: RE | Admit: 2024-10-14 | Discharge: 2024-10-14 | Disposition: A | Discharge status: Home / Self Care | Source: Ambulatory Visit | Attending: Surgery | Admitting: Surgery

## 2024-10-14 ENCOUNTER — Ambulatory Visit (INDEPENDENT_AMBULATORY_CARE_PROVIDER_SITE_OTHER): Admitting: Physician Assistant

## 2024-10-14 VITALS — BP 146/83 | HR 78 | Temp 97.9°F | Ht 63.0 in | Wt 111.0 lb

## 2024-10-14 DIAGNOSIS — R109 Unspecified abdominal pain: Secondary | ICD-10-CM | POA: Insufficient documentation

## 2024-10-14 DIAGNOSIS — K551 Chronic vascular disorders of intestine: Secondary | ICD-10-CM | POA: Diagnosis present

## 2024-10-14 DIAGNOSIS — I739 Peripheral vascular disease, unspecified: Secondary | ICD-10-CM | POA: Insufficient documentation

## 2024-10-21 ENCOUNTER — Telehealth: Payer: Self-pay

## 2024-10-21 NOTE — Telephone Encounter (Signed)
 Patient called again concerning of her blood sugars being in the range of 200-300 and is unsure as to what to do next. Currently following her current regimen of medication of 8 units Lantus  and 6 units of Lyumjev  before meals since the last 2-3 weeks. Wants to know what she needs to do? She is doing her dieting and following the instructions as directed? Her Blood sugar 217 @ 630 AM manually, on her CGM 212 at @ 635 Am and at 8:15 AM it was 149.

## 2024-10-21 NOTE — Telephone Encounter (Signed)
 Patient was notified to to follow current regimen of 8 units of Lantus  in the AM and 3-4 units in the PM at bedtime.

## 2024-10-29 NOTE — Progress Notes (Unsigned)
 Subjective:    Patient ID: Jill Higgins, female    DOB: 1946/05/02, 78 y.o.   MRN: [id number]  HPI: Jill Higgins is a 78 y.o. female who returns for follow up appointment for chronic pain and medication refill. She states her pain is located in her lower back radiating into her right lower extremity. Also reports tingling and burning in her bilateral lower extremities. He rates his pain 5. His current exercise regime is walking and performing stretching exercises.  Jill Higgins Morphine  equivalent is 60.00 MME. She is also prescribed Temazepam  by Dr. Sharps .We have discussed the black box warning of using opioids and benzodiazepines. I highlighted the dangers of using these drugs together and discussed the adverse events including respiratory suppression, overdose, cognitive impairment and importance of compliance with current regimen. We will continue to monitor and adjust as indicated.   Last Oral Swab was Performed on 06/30/2024, it was consistent.      Pain Inventory Average Pain 5 Pain Right Now 5 My pain is intermittent, dull, and aching  In the last 24 hours, has pain interfered with the following? General activity 0 Relation with others 0 Enjoyment of life 0 What TIME of day is your pain at its worst? night Sleep (in general) fair the sleep med  Pain is worse with: walking, bending, sitting, inactivity, standing, and some activites Pain improves with: rest, heat/ice, and medication Relief from Meds: 7  Family History  Problem Relation Age of Onset   Hypertension Mother    Alzheimer's disease Mother    Heart disease Father    Heart attack Father        MI at age 56   Hypertension Sister    Diabetes Brother    Hyperlipidemia Brother    Hypertension Brother    Heart disease Brother    Heart attack Brother    Breast cancer Maternal Aunt    Social History   Socioeconomic History   Marital status: Single    Spouse name: Not on file   Number of children: 1    Years of education: Not on file   Highest education level: Not on file  Occupational History   Occupation: Retired    Associate Professor: SCIENTIST, [employer]  Tobacco Use   Smoking status: Former    Current packs/day: 0.25    Average packs/day: 0.3 packs/day for 1.4 years (0.3 ttl pk-yrs)    Types: Cigarettes    Start date: 06/2023    Quit date: 10/2014    Passive exposure: Never   Smokeless tobacco: Never   Tobacco comments:    pt states that she is using the E cig only   Vaping Use   Vaping status: Some Days  Substance and Sexual Activity   Alcohol use: Yes    Alcohol/week: 0.0 - 1.0 standard drinks of alcohol    Comment: Occasional; 10-22 rarely    Drug use: No   Sexual activity: Not on file  Other Topics Concern   Not on file  Social History Narrative   Drinks about 2 cups of coffee a day, and some tea    Social Drivers of Corporate Investment Banker Strain: Not on file  Food Insecurity: No Food Insecurity (12/11/2023)   Hunger Vital Sign    Worried About Running Out of Food in the Last Year: Never true    Ran Out of Food in the Last Year: Never true  Transportation Needs: No Transportation Needs (12/11/2023)   PRAPARE - Transportation  Lack of Transportation (Medical): No    Lack of Transportation (Non-Medical): No  Physical Activity: Not on file  Stress: Not on file  Social Connections: Moderately Isolated (12/11/2023)   Social Connection and Isolation Panel    Frequency of Communication with Friends and Family: More than three times a week    Frequency of Social Gatherings with Friends and Family: More than three times a week    Attends Religious Services: 1 to 4 times per year    Active Member of Clubs or Organizations: No    Attends Banker Meetings: Never    Marital Status: Divorced   Past Surgical History:  Procedure Laterality Date   ABDOMINAL AORTAGRAM N/A 08/15/2013   Procedure: ABDOMINAL EZELLA;  Surgeon: Carlin FORBES Haddock, MD;  Location: [facility] CATH LAB;   Service: Cardiovascular;  Laterality: N/A;   ABDOMINAL AORTAGRAM N/A 06/19/2014   Procedure: ABDOMINAL EZELLA;  Surgeon: Carlin FORBES Haddock, MD;  Location: [facility] CATH LAB;  Service: Cardiovascular;  Laterality: N/A;   ABDOMINAL AORTOGRAM W/LOWER EXTREMITY N/A 05/10/2018   Procedure: ABDOMINAL AORTOGRAM W/LOWER EXTREMITY;  Surgeon: Haddock Carlin FORBES, MD;  Location: [facility] INVASIVE CV LAB;  Service: Cardiovascular;  Laterality: N/A;  bilateral   ABDOMINAL AORTOGRAM W/LOWER EXTREMITY N/A 04/20/2023   Procedure: ABDOMINAL AORTOGRAM W/LOWER EXTREMITY;  Surgeon: Magda Debby SAILOR, MD;  Location: [facility] INVASIVE CV LAB;  Service: Cardiovascular;  Laterality: N/A;   Aortogram w/ PTA  05/14/08,  11-04-10   Bilateral aortogram w/ bilateral  SFA PTA  stenting    APPLICATION OF WOUND VAC Right 12/09/2023   Procedure: APPLICATION OF WOUND VAC;  Surgeon: Gretta Lonni PARAS, MD;  Location: [facility] OR;  Service: Vascular;  Laterality: Right;   AXILLARY-FEMORAL BYPASS GRAFT Right 11/15/2023   Procedure: RIGHT AXILLO-FEMORAL BYPASS GRAFT;  Surgeon: Magda Debby SAILOR, MD;  Location: [facility] OR;  Service: Vascular;  Laterality: Right;   AXILLARY-FEMORAL BYPASS GRAFT Right 12/09/2023   Procedure: EXCISION AXILLA-BIFEMORAL RIGHT;  Surgeon: Gretta Lonni PARAS, MD;  Location: [facility] OR;  Service: Vascular;  Laterality: Right;   BREAST SURGERY Right    boil removal   BUNIONECTOMY     L foot in the 1980s   COLONOSCOPY     2014   DILATION AND CURETTAGE OF UTERUS     X4   ENDARTERECTOMY FEMORAL Right 06/03/2018   Procedure: RIGHT FEMORAL ENDARTERECTOMY;  Surgeon: Haddock Carlin FORBES, MD;  Location: [facility] OR;  Service: Vascular;  Laterality: Right;   Epidural shots in neck      ESOPHAGOGASTRODUODENOSCOPY (EGD) WITH PROPOFOL  N/A 09/15/2019   Procedure: ESOPHAGOGASTRODUODENOSCOPY (EGD) WITH PROPOFOL ;  Surgeon: Burnette Fallow, MD;  Location: [facility] ENDOSCOPY;  Service: Endoscopy;  Laterality: N/A;   EUS N/A 09/15/2019   Procedure: UPPER ENDOSCOPIC  ULTRASOUND (EUS) LINEAR;  Surgeon: Burnette Fallow, MD;  Location: [facility] ENDOSCOPY;  Service: Endoscopy;  Laterality: N/A;   EYE SURGERY Bilateral 2016   cataract removal   FEMORAL ARTERY EXPLORATION Right 12/09/2023   Procedure: Vein Patch Right FEMORAL ARTERY;  Surgeon: Gretta Lonni PARAS, MD;  Location: [facility] OR;  Service: Vascular;  Laterality: Right;   FEMORAL-POPLITEAL BYPASS GRAFT Left 10/21/2013   Procedure: LEFT FEMORAL-POPLITEAL ARTERY BYPASS WITH SAPHENOUS VEIN GRAFT , POPLITEAL ENDARTERECTOMY ,INTRAOPERATIVE ARTERIOGRAM, vein patch angioplasty to popliteal artery;  Surgeon: Carlin FORBES Haddock, MD;  Location: [facility] OR;  Service: Vascular;  Laterality: Left;   FEMORAL-POPLITEAL BYPASS GRAFT Right 02/08/2016   Procedure: Right FEMORAL- to Above Knee POPLITEAL ARTERY Bypass Graft with reversed saphenous vein and  Common Femoral Endarterectomy  with profundoplasty;  Surgeon: Carlin FORBES Haddock, MD;  Location: [facility] OR;  Service: Vascular;  Laterality: Right;   FINE NEEDLE ASPIRATION N/A 09/15/2019   Procedure: FINE NEEDLE ASPIRATION (FNA) LINEAR;  Surgeon: Burnette Fallow, MD;  Location: [facility]OS[facility];  Service: Endoscopy;  Laterality: N/A;   GROIN DEBRIDEMENT Left 10/28/2013   Procedure: left inner thigh DEBRIDEMENT;  Surgeon: Carlin FORBES Haddock, MD;  Location: [facility] OR;  Service: Vascular;  Laterality: Left;   HAND SURGERY Left 2010   IR FLUORO GUIDE CV LINE RIGHT  12/14/2023   IR REMOVAL TUN CV CATH W/O FL  01/04/2024   IR US  GUIDE VASC ACCESS RIGHT  12/14/2023   LOWER EXTREMITY ANGIOGRAM Bilateral 12/24/2015   Procedure: Lower Extremity Angiogram;  Surgeon: Carlin FORBES Haddock, MD;  Location: [facility] INVASIVE CV LAB;  Service: Cardiovascular;  Laterality: Bilateral;   PATCH ANGIOPLASTY Right 06/03/2018   Procedure: PATCH ANGIOPLASTY USING A XENOSURE 1CM X 14CM BIOLOGIC PATCH;  Surgeon: Haddock Carlin FORBES, MD;  Location: [facility] OR;  Service: Vascular;  Laterality: Right;   PERIPHERAL VASCULAR CATHETERIZATION N/A 12/24/2015    Procedure: Abdominal Aortogram;  Surgeon: Carlin FORBES Haddock, MD;  Location: [facility] INVASIVE CV LAB;  Service: Cardiovascular;  Laterality: N/A;   PERIPHERAL VASCULAR CATHETERIZATION Left 12/24/2015   Procedure: Peripheral Vascular Balloon Angioplasty;  Surgeon: Carlin FORBES Haddock, MD;  Location: [facility] INVASIVE CV LAB;  Service: Cardiovascular;  Laterality: Left;  drug coated balloon   PERIPHERAL VASCULAR CATHETERIZATION N/A 06/30/2016   Procedure: Abdominal Aortogram;  Surgeon: Carlin FORBES Haddock, MD;  Location: [facility] INVASIVE CV LAB;  Service: Cardiovascular;  Laterality: N/A;   PERIPHERAL VASCULAR CATHETERIZATION Bilateral 06/30/2016   Procedure: Lower Extremity Angiography;  Surgeon: Carlin FORBES Haddock, MD;  Location: [facility] INVASIVE CV LAB;  Service: Cardiovascular;  Laterality: Bilateral;   TONSILLECTOMY     TRANSESOPHAGEAL ECHOCARDIOGRAM (CATH LAB) N/A 12/13/2023   Procedure: TRANSESOPHAGEAL ECHOCARDIOGRAM;  Surgeon: Raford Riggs, MD;  Location: [facility] INVASIVE CV LAB;  Service: Cardiovascular;  Laterality: N/A;   VEIN HARVEST Left 12/09/2023   Procedure: BACILIC VEIN HARVEST LEFT ARM;  Surgeon: Gretta Lonni PARAS, MD;  Location: [facility] OR;  Service: Vascular;  Laterality: Left;   VEIN REPAIR Right 12/09/2023   Procedure: VEIN  PATCH RIGHT AXILLIA ARTERY RIGHT;  Surgeon: Gretta Lonni PARAS, MD;  Location: [facility] OR;  Service: Vascular;  Laterality: Right;   Past Surgical History:  Procedure Laterality Date   ABDOMINAL AORTAGRAM N/A 08/15/2013   Procedure: ABDOMINAL EZELLA;  Surgeon: Carlin FORBES Haddock, MD;  Location: [facility] CATH LAB;  Service: Cardiovascular;  Laterality: N/A;   ABDOMINAL AORTAGRAM N/A 06/19/2014   Procedure: ABDOMINAL EZELLA;  Surgeon: Carlin FORBES Haddock, MD;  Location: [facility] CATH LAB;  Service: Cardiovascular;  Laterality: N/A;   ABDOMINAL AORTOGRAM W/LOWER EXTREMITY N/A 05/10/2018   Procedure: ABDOMINAL AORTOGRAM W/LOWER EXTREMITY;  Surgeon: Haddock Carlin FORBES, MD;  Location: [facility] INVASIVE CV LAB;  Service:  Cardiovascular;  Laterality: N/A;  bilateral   ABDOMINAL AORTOGRAM W/LOWER EXTREMITY N/A 04/20/2023   Procedure: ABDOMINAL AORTOGRAM W/LOWER EXTREMITY;  Surgeon: Magda Debby SAILOR, MD;  Location: [facility] INVASIVE CV LAB;  Service: Cardiovascular;  Laterality: N/A;   Aortogram w/ PTA  05/14/08,  11-04-10   Bilateral aortogram w/ bilateral  SFA PTA  stenting    APPLICATION OF WOUND VAC Right 12/09/2023   Procedure: APPLICATION OF WOUND VAC;  Surgeon: Gretta Lonni PARAS, MD;  Location[facility] OR;  Service: Vascular;  Laterality: Right;   AXILLARY-FEMORAL BYPASS GRAFT  Right 11/15/2023   Procedure: RIGHT AXILLO-FEMORAL BYPASS GRAFT;  Surgeon: Magda Debby SAILOR, MD;  Location: [facility] OR;  Service: Vascular;  Laterality: Right;   AXILLARY-FEMORAL BYPASS GRAFT Right 12/09/2023   Procedure: EXCISION AXILLA-BIFEMORAL RIGHT;  Surgeon: Gretta Lonni PARAS, MD;  Location: [facility] OR;  Service: Vascular;  Laterality: Right;   BREAST SURGERY Right    boil removal   BUNIONECTOMY     L foot in the 1980s   COLONOSCOPY     2014   DILATION AND CURETTAGE OF UTERUS     X4   ENDARTERECTOMY FEMORAL Right 06/03/2018   Procedure: RIGHT FEMORAL ENDARTERECTOMY;  Surgeon: Harvey Carlin BRAVO, MD;  Location: [facility] OR;  Service: Vascular;  Laterality: Right;   Epidural shots in neck      ESOPHAGOGASTRODUODENOSCOPY (EGD) WITH PROPOFOL  N/A 09/15/2019   Procedure: ESOPHAGOGASTRODUODENOSCOPY (EGD) WITH PROPOFOL ;  Surgeon: Burnette Fallow, MD;  Location[facility];  Service: Endoscopy;  Laterality: N/A;   EUS N/A 09/15/2019   Procedure: UPPER ENDOSCOPIC ULTRASOUND (EUS) LINEAR;  Surgeon: Burnette Fallow, MD;  Location: [facility]OS[facility];  Service: Endoscopy;  Laterality: N/A;   EYE SURGERY Bilateral 2016   cataract removal   FEMORAL ARTERY EXPLORATION Right 12/09/2023   Procedure: Vein Patch Right FEMORAL ARTERY;  Surgeon: Gretta Lonni PARAS, MD;  Location: [facility] OR;  Service: Vascular;  Laterality: Right;   FEMORAL-POPLITEAL BYPASS GRAFT Left  10/21/2013   Procedure: LEFT FEMORAL-POPLITEAL ARTERY BYPASS WITH SAPHENOUS VEIN GRAFT , POPLITEAL ENDARTERECTOMY ,INTRAOPERATIVE ARTERIOGRAM, vein patch angioplasty to popliteal artery;  Surgeon: Carlin BRAVO Harvey, MD;  Location: [facility] OR;  Service: Vascular;  Laterality: Left;   FEMORAL-POPLITEAL BYPASS GRAFT Right 02/08/2016   Procedure: Right FEMORAL- to Above Knee POPLITEAL ARTERY Bypass Graft with reversed saphenous vein and Common Femoral Endarterectomy  with profundoplasty;  Surgeon: Carlin BRAVO Harvey, MD;  Location: [facility] OR;  Service: Vascular;  Laterality: Right;   FINE NEEDLE ASPIRATION N/A 09/15/2019   Procedure: FINE NEEDLE ASPIRATION (FNA) LINEAR;  Surgeon: Burnette Fallow, MD;  Location: [facility]OS[facility];  Service: Endoscopy;  Laterality: N/A;   GROIN DEBRIDEMENT Left 10/28/2013   Procedure: left inner thigh DEBRIDEMENT;  Surgeon: Carlin BRAVO Harvey, MD;  Location: [facility] OR;  Service: Vascular;  Laterality: Left;   HAND SURGERY Left 2010   IR FLUORO GUIDE CV LINE RIGHT  12/14/2023   IR REMOVAL TUN CV CATH W/O FL  01/04/2024   IR US  GUIDE VASC ACCESS RIGHT  12/14/2023   LOWER EXTREMITY ANGIOGRAM Bilateral 12/24/2015   Procedure: Lower Extremity Angiogram;  Surgeon: Carlin BRAVO Harvey, MD;  Location: [facility] INVASIVE CV LAB;  Service: Cardiovascular;  Laterality: Bilateral;   PATCH ANGIOPLASTY Right 06/03/2018   Procedure: PATCH ANGIOPLASTY USING A XENOSURE 1CM X 14CM BIOLOGIC PATCH;  Surgeon: Harvey Carlin BRAVO, MD;  Location[facility] OR;  Service: Vascular;  Laterality: Right;   PERIPHERAL VASCULAR CATHETERIZATION N/A 12/24/2015   Procedure: Abdominal Aortogram;  Surgeon: Carlin BRAVO Harvey, MD;  Location: [facility] INVASIVE CV LAB;  Service: Cardiovascular;  Laterality: N/A;   PERIPHERAL VASCULAR CATHETERIZATION Left 12/24/2015   Procedure: Peripheral Vascular Balloon Angioplasty;  Surgeon: Carlin BRAVO Harvey, MD;  Location: [facility] INVASIVE CV LAB;  Service: Cardiovascular;  Laterality: Left;  drug coated balloon   PERIPHERAL VASCULAR  CATHETERIZATION N/A 06/30/2016   Procedure: Abdominal Aortogram;  Surgeon: Carlin BRAVO Harvey, MD;  Location: [facility] INVASIVE CV LAB;  Service: Cardiovascular;  Laterality: N/A;   PERIPHERAL VASCULAR CATHETERIZATION Bilateral 06/30/2016   Procedure: Lower Extremity Angiography;  Surgeon: Carlin BRAVO  Fields, MD;  Location: [facility] INVASIVE CV LAB;  Service: Cardiovascular;  Laterality: Bilateral;   TONSILLECTOMY     TRANSESOPHAGEAL ECHOCARDIOGRAM (CATH LAB) N/A 12/13/2023   Procedure: TRANSESOPHAGEAL ECHOCARDIOGRAM;  Surgeon: Raford Riggs, MD;  Location: [facility] INVASIVE CV LAB;  Service: Cardiovascular;  Laterality: N/A;   VEIN HARVEST Left 12/09/2023   Procedure: BACILIC VEIN HARVEST LEFT ARM;  Surgeon: Gretta Lonni PARAS, MD;  Location: [facility] OR;  Service: Vascular;  Laterality: Left;   VEIN REPAIR Right 12/09/2023   Procedure: VEIN  PATCH RIGHT AXILLIA ARTERY RIGHT;  Surgeon: Gretta Lonni PARAS, MD;  Location: [facility] OR;  Service: Vascular;  Laterality: Right;   Past Medical History:  Diagnosis Date   Bronchitis    Bulging disc    in neck   Depression with anxiety    Diabetes mellitus    Type II   Fibromyalgia    pt. denies   GERD (gastroesophageal reflux disease)    Glaucoma    History of bronchitis    Hyperlipidemia    Hypertension    Overactive bladder    Peripheral vascular disease    Pneumonia    Sleep apnea    recent test negative for sleep apnea   Tobacco abuse    Urinary frequency    There were no vitals taken for this visit.  Opioid Risk Score:   Fall Risk Score:  `1  Depression screen PHQ 2/9     07/28/2024    1:27 PM 06/30/2024    1:27 PM 11/11/2020    3:39 PM  Depression screen PHQ 2/9  Decreased Interest 0 1 0  Down, Depressed, Hopeless 0 1 0  PHQ - 2 Score 0 2 0  Altered sleeping  3   Tired, decreased energy  3   Change in appetite  2   Feeling bad or failure about yourself   0   Trouble concentrating  0   Moving slowly or fidgety/restless  2   Suicidal thoughts  0    PHQ-9 Score  12    Difficult doing work/chores  Extremely dIfficult      Data saved with a previous flowsheet row definition    Review of Systems  Musculoskeletal:        Pain in both legs hips down to both feet.  Skin:  Positive for rash (on left lower leg).  All other systems reviewed and are negative.      Objective:   Physical Exam Vitals and nursing note reviewed.  Constitutional:      Appearance: Normal appearance.  Cardiovascular:     Rate and Rhythm: Normal rate and regular rhythm.     Pulses: Normal pulses.     Heart sounds: Normal heart sounds.  Pulmonary:     Effort: Pulmonary effort is normal.     Breath sounds: Normal breath sounds.  Musculoskeletal:     Comments: Normal Muscle Bulk and Muscle Testing Reveals:  Upper Extremities: Full ROM and Muscle Strength 5/5 Lumbar Paraspinal Tenderness: L-4-L-5 Lower Extremities: Full ROM and Muscle Strength 5.5 Arises from Table slowly using cane for support Narrow Based  Gait     Skin:    General: Skin is warm and dry.  Neurological:     Mental Status: She is alert and oriented to person, place, and time.  Psychiatric:        Mood and Affect: Mood normal.        Behavior: Behavior normal.  Assessment & Plan:  Chronic Right Shoulder Pain: No complaints today. Continue Tramadol  as prescribed.  We will continue the opioid monitoring program, this consists of regular clinic visits, examinations, urine drug screen, pill counts as well as use of [place]  Controlled Substance Reporting system. A 12 month History has been reviewed on the [place]  Controlled Substance Reporting System on 10/30/2024  Neuropathy of Bilateral Lower Extremities: Continue current medication regimen. Continue to monitor. 10/30/2024 Chronic Pain Syndrome: Refilled: Tramadol  50 mg two tablets three times a day as needed for pain #120. We will continue the opioid monitoring program, this consists of regular clinic visits,  examinations, urine drug screen, pill counts as well as use of [place]  Controlled Substance Reporting system. A 12 month History has been reviewed on the [place]  Controlled Substance Reporting System on 10/30/2024    F/U in 3 months

## 2024-10-30 ENCOUNTER — Encounter: Attending: Registered Nurse | Admitting: Registered Nurse

## 2024-10-30 ENCOUNTER — Encounter: Payer: Self-pay | Admitting: Registered Nurse

## 2024-10-30 VITALS — BP 152/76 | HR 68 | Ht 63.0 in | Wt 112.6 lb

## 2024-10-30 DIAGNOSIS — G8929 Other chronic pain: Secondary | ICD-10-CM | POA: Diagnosis present

## 2024-10-30 DIAGNOSIS — G894 Chronic pain syndrome: Secondary | ICD-10-CM

## 2024-10-30 DIAGNOSIS — M5441 Lumbago with sciatica, right side: Secondary | ICD-10-CM | POA: Diagnosis not present

## 2024-10-30 DIAGNOSIS — Z5181 Encounter for therapeutic drug level monitoring: Secondary | ICD-10-CM

## 2024-10-30 DIAGNOSIS — Z79891 Long term (current) use of opiate analgesic: Secondary | ICD-10-CM | POA: Diagnosis not present

## 2024-10-30 DIAGNOSIS — G5793 Unspecified mononeuropathy of bilateral lower limbs: Secondary | ICD-10-CM

## 2024-10-30 MED ORDER — TRAMADOL HCL 50 MG PO TABS: 100.0000 mg | ORAL_TABLET | Freq: Three times a day (TID) | ORAL | 1 refills | Status: AC | PRN

## 2024-10-30 NOTE — Patient Instructions (Signed)
 Call your Primary Care Physician  and Vascular regarding your concerns of  your left lower extremity.   Send a My chart update or call office  [phone]

## 2024-11-04 ENCOUNTER — Encounter: Payer: Self-pay | Admitting: Internal Medicine

## 2024-11-04 ENCOUNTER — Ambulatory Visit: Admitting: Internal Medicine

## 2024-11-04 VITALS — BP 124/60 | HR 60 | Ht 63.0 in | Wt 114.0 lb

## 2024-11-04 DIAGNOSIS — Z794 Long term (current) use of insulin: Secondary | ICD-10-CM | POA: Diagnosis not present

## 2024-11-04 DIAGNOSIS — E7800 Pure hypercholesterolemia, unspecified: Secondary | ICD-10-CM

## 2024-11-04 DIAGNOSIS — E1151 Type 2 diabetes mellitus with diabetic peripheral angiopathy without gangrene: Secondary | ICD-10-CM

## 2024-11-04 LAB — POCT GLYCOSYLATED HEMOGLOBIN (HGB A1C): Hemoglobin A1C: 8.4 % — AB (ref 4.0–5.6)

## 2024-11-04 NOTE — Progress Notes (Signed)
 Patient ID: Jill Higgins, female   DOB: 1946-10-04, 78 y.o.   MRN: [id number]  HPI: Jill Higgins is a 78 y.o.-year-old female, returning for follow-up for DM2, dx in 2013, insulin -dependent, uncontrolled, with complications (CAD, PAD). Pt. previously saw Dr. Kassie, but last visit with me 1.5 mo ago.  Interim history: No increased urination,  blurry vision.  She does have some nausea. Her CEA remains elevated.  This is considered due to her chronic pancreatitis as no malignancy has been found so far. She has itching and a rash on the shin from scratching. She had 2 teeth extracted last week.  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  Reviewed HbA1c: Lab Results  Component Value Date   HGBA1C 6.3 (A) 08/07/2024   HGBA1C 7.4 (A) 05/05/2024   HGBA1C 6.1 (A) 01/24/2024   HGBA1C 9.5 (A) 09/27/2023   HGBA1C 7.3 (A) 05/10/2023   HGBA1C 5.6 12/18/2022   HGBA1C 7.0 (A) 08/17/2022   HGBA1C 7.0 (A) 04/20/2022   HGBA1C 6.3 (A) 01/23/2022   HGBA1C 6.8 (A) 10/24/2021  06/26/2022: HbA1c 8.5%   She was previously on: - Metformin  ER 1000 >> 500 mg 2x a day, with meals  - NovoLog  4-8 units 2x a day >> Novolog  2-6 units 2x a day 15 min before the meal If the sugars are high between meals, you can correct with NovoLog : >200: + 1 unit >300: + 2 units >400 + 3 units She had previous hypoglycemia with glipizide. She was on repaglinide  until we started NovoLog .  Currently on:  - Lantus  8 >> 6 units in am - FiAsp  >> Novolog  4-8 >> 6-10 ... >> 2-3 units before large meals only >> Humalog  4 >> Lyumjev   4-5 units before meals She was previously on Lantus  8-10 and up to 20 units daily, but stopped 01/2024. She was previously on metformin  ER 500 mg twice a day but stopped in 05/2024. She was previously on sliding scale with  Fiasp  but stopped due to low blood sugars: >200: + 1 unit >300: + 2 units >400 + 3 units  Pt checks her sugars >4x a day with the Libre 2 CGM - PDM:  Previously:  Prev.:  Lowest blood sugars: 27!!! >> 55 >> 65 >> 50s; she has hypoglycemia awareness at 70.  In 12/2023, her sugars dropped to 27 (she lost consciousness) per EMS.  Afterwards, her doses of insulin  were reduced.  Highest sugar was  high 300s >> 306 >> 390 >> HI.  Glucometer: Accu-Chek guide  Pt's meals - 2x a day.  - no CKD, BUN/creatinine:  Lab Results  Component Value Date   BUN 7 (L) 05/28/2024   BUN 9 12/19/2023   CREATININE 0.83 05/28/2024   CREATININE 0.99 12/19/2023   Lab Results  Component Value Date   MICRALBCREAT 5 05/05/2024  She is not on ACE inhibitor/ARB.  -+ HL; last set of lipids: 02/04/2024: 105/87/60/27 Lab Results  Component Value Date   CHOL 48 12/10/2023   HDL 20 (L) 12/10/2023   LDLCALC 15 12/10/2023   TRIG 65 12/10/2023   CHOLHDL 2.4 12/10/2023  On Mevacor 10 mg daily  - last eye exam was 06/03/2024. No DR reportedly. + Glaucoma. Dr. Milissa.  - + numbness and tingling in her feet.  Last foot exam 06/20/2024 by Dr. Lamount.  On Gabapentin .  She has a history of HTN, overactive bladder, depression/anxiety, anemia-on iron 325 mg daily. 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  In 2024, she had axillo-femoral bypass graft  - she had a lot of pain.  She was also admitted with bacteremia after surgery.  She was in rehab afterwards.  On Pletal .  ROS: + see HPI  Past Medical History:  Diagnosis Date   Bronchitis    Bulging disc    in neck   Depression with anxiety    Diabetes mellitus    Type II   Fibromyalgia    pt. denies   GERD (gastroesophageal reflux disease)    Glaucoma    History of bronchitis    Hyperlipidemia    Hypertension    Overactive bladder    Peripheral vascular  disease    Pneumonia    Sleep apnea    recent test negative for sleep apnea   Tobacco abuse    Urinary frequency    Past Surgical History:  Procedure Laterality Date   ABDOMINAL AORTAGRAM N/A 08/15/2013   Procedure: ABDOMINAL EZELLA;  Surgeon: Carlin FORBES Haddock, MD;  Location: [facility] CATH LAB;  Service: Cardiovascular;  Laterality: N/A;   ABDOMINAL AORTAGRAM N/A 06/19/2014   Procedure: ABDOMINAL EZELLA;  Surgeon: Carlin FORBES Haddock, MD;  Location: [facility] CATH LAB;  Service: Cardiovascular;  Laterality: N/A;   ABDOMINAL AORTOGRAM W/LOWER EXTREMITY N/A 05/10/2018   Procedure: ABDOMINAL AORTOGRAM W/LOWER EXTREMITY;  Surgeon: Haddock Carlin FORBES, MD;  Location: [facility] INVASIVE CV LAB;  Service: Cardiovascular;  Laterality: N/A;  bilateral   ABDOMINAL AORTOGRAM W/LOWER EXTREMITY N/A 04/20/2023   Procedure: ABDOMINAL AORTOGRAM W/LOWER EXTREMITY;  Surgeon: Magda Debby SAILOR, MD;  Location: [facility] INVASIVE CV LAB;  Service: Cardiovascular;  Laterality: N/A;   Aortogram w/ PTA  05/14/08,  11-04-10   Bilateral aortogram w/ bilateral  SFA PTA  stenting    APPLICATION OF WOUND VAC Right 12/09/2023   Procedure: APPLICATION OF WOUND VAC;  Surgeon: Gretta Lonni PARAS, MD;  Location: [facility] OR;  Service: Vascular;  Laterality: Right;   AXILLARY-FEMORAL BYPASS GRAFT Right 11/15/2023   Procedure: RIGHT AXILLO-FEMORAL BYPASS GRAFT;  Surgeon: Magda Debby SAILOR, MD;  Location: [facility] OR;  Service: Vascular;  Laterality: Right;   AXILLARY-FEMORAL BYPASS GRAFT Right 12/09/2023   Procedure: EXCISION AXILLA-BIFEMORAL RIGHT;  Surgeon: Gretta Lonni PARAS, MD;  Location: [facility] OR;  Service: Vascular;  Laterality: Right;   BREAST SURGERY Right    boil removal   BUNIONECTOMY     L foot in the 1980s   COLONOSCOPY     2014   DILATION AND CURETTAGE OF UTERUS     X4   ENDARTERECTOMY FEMORAL Right 06/03/2018   Procedure: RIGHT FEMORAL ENDARTERECTOMY;  Surgeon: Haddock Carlin FORBES, MD;  Location[facility] OR;  Service: Vascular;  Laterality: Right;    Epidural shots in neck      ESOPHAGOGASTRODUODENOSCOPY (EGD) WITH PROPOFOL  N/A 09/15/2019   Procedure: ESOPHAGOGASTRODUODENOSCOPY (EGD) WITH PROPOFOL ;  Surgeon: Burnette Fallow, MD;  Location: [facility] ENDOSCOPY;  Service: Endoscopy;  Laterality: N/A;   EUS N/A 09/15/2019   Procedure: UPPER ENDOSCOPIC ULTRASOUND (EUS) LINEAR;  Surgeon: Burnette Fallow, MD;  Location: [facility] ENDOSCOPY;  Service: Endoscopy;  Laterality: N/A;   EYE SURGERY Bilateral 2016   cataract removal   FEMORAL ARTERY EXPLORATION Right 12/09/2023   Procedure: Vein Patch Right FEMORAL ARTERY;  Surgeon: Gretta Lonni PARAS, MD;  Location: [facility] OR;  Service: Vascular;  Laterality: Right;   FEMORAL-POPLITEAL BYPASS GRAFT Left 10/21/2013   Procedure: LEFT FEMORAL-POPLITEAL ARTERY BYPASS WITH SAPHENOUS VEIN GRAFT , POPLITEAL ENDARTERECTOMY ,INTRAOPERATIVE ARTERIOGRAM, vein patch angioplasty to popliteal artery;  Surgeon: Carlin FORBES Haddock, MD;  Location: [facility] OR;  Service: Vascular;  Laterality: Left;   FEMORAL-POPLITEAL BYPASS GRAFT Right 02/08/2016   Procedure: Right FEMORAL- to Above Knee POPLITEAL ARTERY Bypass Graft with reversed saphenous vein and Common Femoral Endarterectomy  with profundoplasty;  Surgeon: Carlin FORBES Haddock, MD;  Location: [facility] OR;  Service: Vascular;  Laterality: Right;   FINE NEEDLE ASPIRATION N/A 09/15/2019   Procedure: FINE NEEDLE ASPIRATION (FNA) LINEAR;  Surgeon: Burnette Fallow, MD;  Location: [facility] ENDOSCOPY;  Service: Endoscopy;  Laterality: N/A;   GROIN DEBRIDEMENT Left 10/28/2013   Procedure: left inner thigh DEBRIDEMENT;  Surgeon: Carlin FORBES Haddock, MD;  Location: [facility] OR;  Service: Vascular;  Laterality: Left;   HAND SURGERY Left 2010   IR FLUORO GUIDE CV LINE RIGHT  12/14/2023   IR REMOVAL TUN CV CATH W/O FL  01/04/2024   IR US  GUIDE VASC ACCESS RIGHT  12/14/2023   LOWER EXTREMITY ANGIOGRAM Bilateral 12/24/2015   Procedure: Lower Extremity Angiogram;  Surgeon: Carlin FORBES Haddock, MD;  Location: [facility] INVASIVE CV LAB;  Service:  Cardiovascular;  Laterality: Bilateral;   PATCH ANGIOPLASTY Right 06/03/2018   Procedure: PATCH ANGIOPLASTY USING A XENOSURE 1CM X 14CM BIOLOGIC PATCH;  Surgeon: Haddock Carlin FORBES, MD;  Location: [facility] OR;  Service: Vascular;  Laterality: Right;   PERIPHERAL VASCULAR CATHETERIZATION N/A 12/24/2015   Procedure: Abdominal Aortogram;  Surgeon: Carlin FORBES Haddock, MD;  Location: [facility] INVASIVE CV LAB;  Service: Cardiovascular;  Laterality: N/A;   PERIPHERAL VASCULAR CATHETERIZATION Left 12/24/2015   Procedure: Peripheral Vascular Balloon Angioplasty;  Surgeon: Carlin FORBES Haddock, MD;  Location: [facility] INVASIVE CV LAB;  Service: Cardiovascular;  Laterality: Left;  drug coated balloon   PERIPHERAL VASCULAR CATHETERIZATION N/A 06/30/2016   Procedure: Abdominal Aortogram;  Surgeon: Carlin FORBES Haddock, MD;  Location: [facility] INVASIVE CV LAB;  Service: Cardiovascular;  Laterality: N/A;   PERIPHERAL VASCULAR CATHETERIZATION Bilateral 06/30/2016   Procedure: Lower Extremity Angiography;  Surgeon: Carlin FORBES Haddock, MD;  Location: [place] INVASIVE CV LAB;  Service: Cardiovascular;  Laterality: Bilateral;   TONSILLECTOMY     TRANSESOPHAGEAL ECHOCARDIOGRAM (CATH LAB) N/A 12/13/2023   Procedure: TRANSESOPHAGEAL ECHOCARDIOGRAM;  Surgeon: Raford Riggs, MD;  Location: [facility] INVASIVE CV LAB;  Service: Cardiovascular;  Laterality: N/A;   VEIN HARVEST Left 12/09/2023   Procedure: BACILIC VEIN HARVEST LEFT ARM;  Surgeon: Gretta Lonni PARAS, MD;  Location: [facility] OR;  Service: Vascular;  Laterality: Left;   VEIN REPAIR Right 12/09/2023   Procedure: VEIN  PATCH RIGHT AXILLIA ARTERY RIGHT;  Surgeon: Gretta Lonni PARAS, MD;  Location: [facility] OR;  Service: Vascular;  Laterality: Right;   Social History   Socioeconomic History   Marital status: Single    Spouse name: Not on file   Number of children: 1   Years of education: Not on file   Highest education level: Not on file  Occupational History   Occupation: Retired    Employer: [employer]  Tobacco Use    Smoking status: Former    Current packs/day: 0.25    Average packs/day: 0.3 packs/day for 1.4 years (0.3 ttl pk-yrs)    Types: Cigarettes    Start date: 06/2023    Quit date: 10/2014    Passive exposure: Never   Smokeless tobacco: Never   Tobacco comments:  pt states that she is using the E cig only   Vaping Use   Vaping status: Some Days  Substance and Sexual Activity   Alcohol use: Yes    Alcohol/week: 0.0 - 1.0 standard drinks of alcohol    Comment: Occasional; 10-22 rarely    Drug use: No   Sexual activity: Not on file  Other Topics Concern   Not on file  Social History Narrative   Drinks about 2 cups of coffee a day, and some tea    Social Drivers of Health   Tobacco Use: Medium Risk (10/14/2024)   Patient History    Smoking Tobacco Use: Former    Smokeless Tobacco Use: Never    Passive Exposure: Never  Physicist, Medical Strain: Not on file  Food Insecurity: No Food Insecurity (12/11/2023)   Hunger Vital Sign    Worried About Running Out of Food in the Last Year: Never true    Ran Out of Food in the Last Year: Never true  Transportation Needs: No Transportation Needs (12/11/2023)   PRAPARE - Administrator, Civil Service (Medical): No    Lack of Transportation (Non-Medical): No  Physical Activity: Not on file  Stress: Not on file  Social Connections: Moderately Isolated (12/11/2023)   Social Connection and Isolation Panel    Frequency of Communication with Friends and Family: More than three times a week    Frequency of Social Gatherings with Friends and Family: More than three times a week    Attends Religious Services: 1 to 4 times per year    Active Member of [employer] or Organizations: No    Attends Banker Meetings: Never    Marital Status: Divorced  Catering Manager Violence: Not At Risk (12/11/2023)   Humiliation, Afraid, Rape, and Kick questionnaire    Fear of Current or Ex-Partner: No    Emotionally Abused: No    Physically Abused:  No    Sexually Abused: No  Depression (PHQ2-9): Low Risk (10/30/2024)   Depression (PHQ2-9)    PHQ-2 Score: 0  Alcohol Screen: Not on file  Housing: Low Risk (12/11/2023)   Housing Stability Vital Sign    Unable to Pay for Housing in the Last Year: No    Number of Times Moved in the Last Year: 0    Homeless in the Last Year: No  Utilities: Not At Risk (12/11/2023)   AHC Utilities    Threatened with loss of utilities: No  Health Literacy: Not on file   Current Outpatient Medications on File Prior to Visit  Medication Sig Dispense Refill   amLODipine  (NORVASC ) 5 MG tablet Take 5 mg by mouth daily.     aspirin  EC 81 MG tablet Take 81 mg by mouth at bedtime.     atorvastatin  (LIPITOR) 80 MG tablet Take 1 tablet (80 mg total) by mouth daily. 30 tablet 11   Blood Glucose Monitoring Suppl (ONETOUCH VERIO FLEX SYSTEM) w/Device KIT USE TO CHECK BLOOD SUGAR 3-4 TIMES DAY 1 kit 0   brimonidine (ALPHAGAN) 0.2 % ophthalmic solution 1 drop 3 (three) times daily.     cilostazol  (PLETAL ) 100 MG tablet Take 1 tablet (100 mg total) by mouth 2 (two) times daily before a meal. 60 tablet 11   Continuous Glucose Receiver (FREESTYLE LIBRE 3 READER) DEVI Use to monitor glucose continuously. 1 each 0   Continuous Glucose Sensor (FREESTYLE LIBRE 3 PLUS SENSOR) MISC Inject 1 Device into the skin continuous. Change every 15 days 6  each 3   diclofenac  Sodium (VOLTAREN ) 1 % GEL Apply 1 application. topically daily as needed (leg pain).     DULoxetine  (CYMBALTA ) 60 MG capsule Take 60 mg by mouth daily.     ferrous sulfate  325 (65 FE) MG EC tablet Take 325 mg by mouth daily.     Glucagon  3 MG/DOSE POWD Place 3 mg into the nose once as needed for up to 1 dose. 1 each 11   glucose blood (ONETOUCH VERIO) test strip Use to check blood sugar 3-4 times a day 400 each 5   insulin  glargine (LANTUS ) 100 UNIT/ML Solostar Pen Inject 8 Units into the skin daily. 3 mL 2   insulin  lispro (HUMALOG ) 100 UNIT/ML KwikPen Inject 2-4  Units into the skin 3 (three) times daily. 10 mL 3   Insulin  Lispro-aabc (LYUMJEV  KWIKPEN) 100 UNIT/ML KwikPen Inject 4-5 Units into the skin 3 (three) times daily before meals. 15 mL 3   Insulin  Pen Needle 32G X 4 MM MISC Use to inject insulin  4x a day 400 each 12   latanoprost  (XALATAN ) 0.005 % ophthalmic solution Place 1 drop into both eyes at bedtime.     lipase/protease/amylase 24000-76000 units CPEP Take 1 capsule (24,000 Units total) by mouth 3 (three) times daily before meals. (Patient taking differently: Take 24,000-48,000 Units by mouth See admin instructions. Take 2 capsules before meals and 1 capsule after meals.) 270 capsule 0   Multiple Vitamins-Minerals (CENTRUM SILVER  50+WOMEN) TABS Take 1 tablet by mouth daily.     mupirocin  ointment (BACTROBAN ) 2 % Apply 1 Application topically 2 (two) times daily. Right 1st toe medial border 22 g 0   ondansetron  (ZOFRAN -ODT) 4 MG disintegrating tablet Take 1 tablet (4 mg total) by mouth every 8 (eight) hours as needed for vomiting. 30 tablet 0   OneTouch Delica Lancets 33G MISC Check blood sugar 3-4 times a day 400 each 5   oxybutynin  (DITROPAN -XL) 10 MG 24 hr tablet Take 10 mg by mouth daily.     pantoprazole  (PROTONIX ) 40 MG tablet Take 40 mg by mouth every evening.     senna-docusate (SENOKOT-S) 8.6-50 MG tablet Take 1 tablet by mouth daily.     silver  sulfADIAZINE  (SILVADENE ) 1 % cream Apply 1 Application topically daily. 50 g 0   temazepam  (RESTORIL ) 30 MG capsule Take 1 capsule (30 mg total) by mouth at bedtime as needed for sleep. 30 capsule 0   timolol  (TIMOPTIC ) 0.5 % ophthalmic solution Place 1 drop into both eyes every morning.     traMADol  (ULTRAM ) 50 MG tablet Take 2 tablets (100 mg total) by mouth 3 (three) times daily as needed. 120 tablet 1   traZODone  (DESYREL ) 50 MG tablet Take 75 mg by mouth at bedtime as needed for sleep.     No current facility-administered medications on file prior to visit.   Allergies  Allergen  Reactions   Wound Dressing Adhesive Rash and Other (See Comments)    Pt states that tape and electrodes leave red scars on her skin and her skin is very sensitive.  Paper tape ok to use   Family History  Problem Relation Age of Onset   Hypertension Mother    Alzheimer's disease Mother    Heart disease Father    Heart attack Father        MI at age 33   Hypertension Sister    Diabetes Brother    Hyperlipidemia Brother    Hypertension Brother    Heart disease Brother  Heart attack Brother    Breast cancer Maternal Aunt    PE: BP 124/60   Pulse 60   Ht 5' 3 (1.6 m)   Wt 114 lb (51.7 kg)   SpO2 98%   BMI 20.19 kg/m  Wt Readings from Last 3 Encounters:  11/04/24 114 lb (51.7 kg)  10/30/24 112 lb 9.6 oz (51.1 kg)  10/14/24 111 lb (50.3 kg)   Constitutional: thin, in NAD Eyes:  EOMI, no exophthalmos ENT: no neck masses, no cervical lymphadenopathy Cardiovascular: RRR, No MRG Respiratory: CTA B Musculoskeletal: no deformities Skin:no rashes Neurological: no tremor with outstretched hands  1. DM2, insulin -dependent, controlled, with complications - CAD - PAD - s/p stents  Component     Latest Ref Rng 04/20/2022  Glucose     65 - 99 mg/dL 859 (H)   C-Peptide     0.80 - 3.85 ng/mL 1.50     2.  Hyperlipidemia  PLAN:  1.  Very complex diabetic patient with longstanding, uncontrolled, insulin -dependent diabetes, previously on oral antidiabetic regimen along with basal/bolus insulin , but basal insulin  was stopped last year due to many lows.  She had a blood sugar of 27, during which she lost consciousness.  HbA1c in 09/2023 was higher, at 9.5%, but then improved.  However, afterwards, we had to resume Lantus  and we also adjusted her mealtime insulin .  At last visit, sugars were extremely fluctuating, in a sawtooth pattern most of the time, with sugars in the 40s to 300s.  There was no consistent pattern except the fact that her sugars were almost consistently increasing  after meals then dropping too low after the initial postprandial peak.  She was not usually doing corrections but still doing the sliding scale and injecting Humalog  15 minutes before meals.  She was using the same dose of insulin  before meals and snacks.  Also, she was using 8 units of Lantus  and I advised her to decrease this.  Otherwise it was impossible to understand what she may have been doing wrong (?If she may have fluctuating nutrient absorption due to chronic mesenteric ischemia/chronic pancreatitis) but we did discuss about trying to switch from Humalog  to Lyumjev  which could be injected at the start of the meal.  I also called in a prescription for the intranasal glucagon  for her and advised her how to use it.  Of note, she is alone so we have to be very careful with hypoglycemia. CGM interpretation: -At today's visit, we reviewed her CGM downloads: It appears that 45% of values are in target range (goal >70%), while 52% are higher than 180 (goal <25%), and 3% are lower than 70 (goal <4%).  The calculated average blood sugar is 200.  The projected HbA1c for the next 3 months (GMI) is 8.1%. -Reviewing the CGM trends, sugars appear to be slightly improved from before, now fluctuating mainly around the upper limit of the target range but with significant hyperglycemic excursions, and occasional low blood sugars.  Upon questioning, she was able to switch to Lyumjev , but she is taking this 15 minutes before meals, rather than taking it right at the start of the meal.  I advised her how to take it correctly.  She is also missing doses, for example this morning, when she ate lunch out that she forgot her and at home.  Unfortunately, she is vague about how frequently she misses the Lyumjev  and is also unclear if she is eating in the middle of the night and missing the insulin   dose.  Discussed about paying close attention to taking the insulin  right before meals and to try to not eat in the middle of the  night, however, if she does, to use insulin  for this.  Will continue the same doses of insulin  otherwise, but discussed that if the sugars remain elevated after she starts taking the Lyumjev  correctly, she can increase the dose by 1 unit at least for larger meals. - I suggested to:  Patient Instructions  Please continue: - Lyumjev  at the beginning of the meals - 4-5 (may need 6) units right before meals  - Lantus  6 units daily at night   For correction, do no take more than 2 units of Lyumjev .  Please return in 1.5-2 months.  - we checked her HbA1c: 8.4% (higher) - advised to check sugars at different times of the day - 4x a day, rotating check times - advised for yearly eye exams >> she is UTD - return to clinic in 1.5 months  2. HL - Latest lipid panel was reviewed from 01/2024: Fractions at goal her low HDL improved significantly at last check - She continues on lovastatin 40 mg daily without side effects  Lela Fendt, MD PhD [facility]ocr[facility]

## 2024-11-04 NOTE — Patient Instructions (Addendum)
 Please continue: - Lyumjev  at the beginning of the meals - 4-5 (may need 6) units right before meals  - Lantus  6 units daily at night   For correction, do no take more than 2 units of Lyumjev .  Please return in 1.5-2 months.

## 2024-11-05 ENCOUNTER — Other Ambulatory Visit: Payer: Self-pay

## 2024-11-05 DIAGNOSIS — E1151 Type 2 diabetes mellitus with diabetic peripheral angiopathy without gangrene: Secondary | ICD-10-CM

## 2024-11-05 MED ORDER — INSULIN GLARGINE 100 UNIT/ML SOLOSTAR PEN: PEN_INJECTOR | SUBCUTANEOUS | 2 refills | Status: AC

## 2024-11-06 ENCOUNTER — Encounter: Payer: Self-pay | Admitting: Registered Nurse

## 2024-11-11 ENCOUNTER — Encounter (HOSPITAL_COMMUNITY)

## 2024-11-11 ENCOUNTER — Ambulatory Visit: Admitting: Vascular Surgery

## 2024-11-17 ENCOUNTER — Ambulatory Visit: Admitting: Podiatry

## 2024-11-17 ENCOUNTER — Other Ambulatory Visit: Payer: Self-pay | Admitting: Internal Medicine

## 2024-11-17 ENCOUNTER — Encounter: Payer: Self-pay | Admitting: Podiatry

## 2024-11-17 VITALS — Ht 63.0 in | Wt 114.0 lb

## 2024-11-17 DIAGNOSIS — M79675 Pain in left toe(s): Secondary | ICD-10-CM

## 2024-11-17 DIAGNOSIS — I739 Peripheral vascular disease, unspecified: Secondary | ICD-10-CM | POA: Diagnosis not present

## 2024-11-17 DIAGNOSIS — B351 Tinea unguium: Secondary | ICD-10-CM | POA: Diagnosis not present

## 2024-11-17 DIAGNOSIS — M79674 Pain in right toe(s): Secondary | ICD-10-CM | POA: Diagnosis not present

## 2024-11-17 DIAGNOSIS — L84 Corns and callosities: Secondary | ICD-10-CM

## 2024-11-17 DIAGNOSIS — E1159 Type 2 diabetes mellitus with other circulatory complications: Secondary | ICD-10-CM

## 2024-11-17 NOTE — Progress Notes (Signed)
 This patient returns to my office for at risk foot care.  This patient requires this care by a professional since this patient will be at risk due to having  PVD and diabetes.  This patient is unable to cut nails herself since the patient cannot reach her nails.These nails are painful walking and wearing shoes.  This patient presents for at risk foot care today.  General Appearance  Alert, conversant and in no acute stress.  Vascular  Dorsalis pedis and posterior tibial  pulses are palpable  bilaterally.  Capillary return is within normal limits  bilaterally. Temperature is within normal limits  bilaterally.  Neurologic  Senn-Weinstein monofilament wire test within normal limits  bilaterally. Muscle power within normal limits bilaterally.  Nails Thick disfigured discolored nails with subungual debris  from hallux to fifth toes bilaterally. No evidence of bacterial infection or drainage bilaterally.  Orthopedic  No limitations of motion  feet .  No crepitus or effusions noted.  No bony pathology or digital deformities noted.  Skin  normotropic skin with no porokeratosis noted bilaterally.  No signs of infections or ulcers noted.     Onychomycosis  Pain in right toes  Pain in left toes  Consent was obtained for treatment procedures.   Mechanical debridement of nails 1-5  bilaterally performed with a nail nipper.  Filed with dremel without incident.    Return office visit    3 months                  Told patient to return for periodic foot care and evaluation due to potential at risk complications.   Cordella Bold DPM

## 2024-11-18 ENCOUNTER — Ambulatory Visit (INDEPENDENT_AMBULATORY_CARE_PROVIDER_SITE_OTHER): Admitting: Vascular Surgery

## 2024-11-18 ENCOUNTER — Ambulatory Visit (HOSPITAL_COMMUNITY)
Admission: RE | Admit: 2024-11-18 | Discharge: 2024-11-18 | Disposition: A | Discharge status: Home / Self Care | Source: Ambulatory Visit | Attending: Vascular Surgery | Admitting: Vascular Surgery

## 2024-11-18 ENCOUNTER — Encounter: Payer: Self-pay | Admitting: Vascular Surgery

## 2024-11-18 VITALS — BP 149/79 | HR 58 | Temp 97.8°F

## 2024-11-18 DIAGNOSIS — Z95828 Presence of other vascular implants and grafts: Secondary | ICD-10-CM | POA: Diagnosis not present

## 2024-11-18 DIAGNOSIS — I739 Peripheral vascular disease, unspecified: Secondary | ICD-10-CM

## 2024-11-18 LAB — VAS US ABI WITH/WO TBI
Left ABI: 0.94
Right ABI: 0.43

## 2024-11-26 ENCOUNTER — Other Ambulatory Visit: Payer: Self-pay

## 2024-11-26 DIAGNOSIS — I739 Peripheral vascular disease, unspecified: Secondary | ICD-10-CM

## 2024-11-26 DIAGNOSIS — K551 Chronic vascular disorders of intestine: Secondary | ICD-10-CM

## 2024-11-26 NOTE — Progress Notes (Signed)
 [facility] [place]  ASSESSMENT / PLAN: Jill Higgins is a 79 y.o. female with complex personal vascular history with bilateral lower extremity intervention.  She is currently in very short distance claudication.  Recommend:  Abstinence from all tobacco products. Blood glucose control with goal A1c < 7%. Blood pressure control with goal blood pressure < 140/90 mmHg. Lipid reduction therapy with goal LDL-C <100 mg/dL. Aspirin  81mg  PO QD.  Clopidogrel  75mg  PO QD. Atorvastatin  40-80mg  PO QD (or other high intensity statin therapy).  No real interval changes. Follow-up with me in 6 months.  CHIEF COMPLAINT: follow up angiogram  HISTORY OF PRESENT ILLNESS: Jill Higgins is a 79 y.o. female with complex past vascular history who returns to clinic after angiogram done for right lower extremity pain.  Preangiogram noninvasive testing suggested possible right external iliac artery stenosis and possible left leg bypass stenosis.  Neither was identified on angiogram.  Her angiogram was complicated by closure device malfunction which was successfully managed with covered stenting.  The patient is doing reasonably well on my evaluation.  She continues to have right hip and knee pain.  She recently sustained a fall and had some x-rays made.  The pain is not typical of claudication.  She describes point tenderness in her hip.  I do not think she walks fast enough to claudicate.  She does not have any rest pain in her foot.  She has no ulcers.  09/25/23: Patient returns to clinic for follow-up.  She was initially doing well after covered stenting of right external iliac artery dissection, but has unfortunately developed short distance claudication.  The patient has no pain at rest.  She has no ulcers about her feet.  Her noninvasive studies were performed earlier today and reviewed in person.  02/12/2024: patient returns to discuss her CT angiogram findings.  I again reviewed the  high perioperative risk of anatomic reconstruction.  I counseled her that extra-anatomic reconstruction would expose her to similar risks especially infection.  Ultimately, she would like to avoid any intervention if at all possible.  Her symptoms still remain very frustrating to her, for which I provided sympathy.  11/25/24: Returns to clinic for surveillance. No real change in symptoms. Appears to be doing better from a global health picture. Gaining some weight and strength.   VASCULAR SURGICAL HISTORY:  Left fem-popliteal vein bypass in December of 2014 by Dr. Harvey, a Right femoral to above knee popliteal vein bypass in March of 2017 Redo right femoral endarterectomy in July of 2019 Angiogram, right external iliac artery stenting May 2024 Right ax fem bypass 11/15/2023 Excision of right axillofemoral bypass 12/09/2023 for infection   Past Medical History:  Diagnosis Date   Bronchitis    Bulging disc    in neck   Depression with anxiety    Diabetes mellitus    Type II   Fibromyalgia    pt. denies   GERD (gastroesophageal reflux disease)    Glaucoma    History of bronchitis    Hyperlipidemia    Hypertension    Overactive bladder    Peripheral vascular disease    Pneumonia    Sleep apnea    recent test negative for sleep apnea   Tobacco abuse    Urinary frequency     Past Surgical History:  Procedure Laterality Date   ABDOMINAL AORTAGRAM N/A 08/15/2013   Procedure: ABDOMINAL EZELLA;  Surgeon: Carlin FORBES Harvey, MD;  Location: [facility] CATH LAB;  Service: Cardiovascular;  Laterality: N/A;   ABDOMINAL AORTAGRAM N/A 06/19/2014   Procedure: ABDOMINAL EZELLA;  Surgeon: Carlin FORBES Haddock, MD;  Location: [facility] CATH LAB;  Service: Cardiovascular;  Laterality: N/A;   ABDOMINAL AORTOGRAM W/LOWER EXTREMITY N/A 05/10/2018   Procedure: ABDOMINAL AORTOGRAM W/LOWER EXTREMITY;  Surgeon: Haddock Carlin FORBES, MD;  Location: [facility] INVASIVE CV LAB;  Service: Cardiovascular;  Laterality: N/A;  bilateral    ABDOMINAL AORTOGRAM W/LOWER EXTREMITY N/A 04/20/2023   Procedure: ABDOMINAL AORTOGRAM W/LOWER EXTREMITY;  Surgeon: Magda Debby SAILOR, MD;  Location: [facility] INVASIVE CV LAB;  Service: Cardiovascular;  Laterality: N/A;   Aortogram w/ PTA  05/14/08,  11-04-10   Bilateral aortogram w/ bilateral  SFA PTA  stenting    APPLICATION OF WOUND VAC Right 12/09/2023   Procedure: APPLICATION OF WOUND VAC;  Surgeon: Gretta Lonni PARAS, MD;  Location: [facility] OR;  Service: Vascular;  Laterality: Right;   AXILLARY-FEMORAL BYPASS GRAFT Right 11/15/2023   Procedure: RIGHT AXILLO-FEMORAL BYPASS GRAFT;  Surgeon: Magda Debby SAILOR, MD;  Location: [facility] OR;  Service: Vascular;  Laterality: Right;   AXILLARY-FEMORAL BYPASS GRAFT Right 12/09/2023   Procedure: EXCISION AXILLA-BIFEMORAL RIGHT;  Surgeon: Gretta Lonni PARAS, MD;  Location: [facility] OR;  Service: Vascular;  Laterality: Right;   BREAST SURGERY Right    boil removal   BUNIONECTOMY     L foot in the 1980s   COLONOSCOPY     2014   DILATION AND CURETTAGE OF UTERUS     X4   ENDARTERECTOMY FEMORAL Right 06/03/2018   Procedure: RIGHT FEMORAL ENDARTERECTOMY;  Surgeon: Haddock Carlin FORBES, MD;  Location: [facility] OR;  Service: Vascular;  Laterality: Right;   Epidural shots in neck      ESOPHAGOGASTRODUODENOSCOPY (EGD) WITH PROPOFOL  N/A 09/15/2019   Procedure: ESOPHAGOGASTRODUODENOSCOPY (EGD) WITH PROPOFOL ;  Surgeon: Burnette Fallow, MD;  Location: [facility] ENDOSCOPY;  Service: Endoscopy;  Laterality: N/A;   EUS N/A 09/15/2019   Procedure: UPPER ENDOSCOPIC ULTRASOUND (EUS) LINEAR;  Surgeon: Burnette Fallow, MD;  Location: [facility] ENDOSCOPY;  Service: Endoscopy;  Laterality: N/A;   EYE SURGERY Bilateral 2016   cataract removal   FEMORAL ARTERY EXPLORATION Right 12/09/2023   Procedure: Vein Patch Right FEMORAL ARTERY;  Surgeon: Gretta Lonni PARAS, MD;  Location: [facility] OR;  Service: Vascular;  Laterality: Right;   FEMORAL-POPLITEAL BYPASS GRAFT Left 10/21/2013   Procedure: LEFT FEMORAL-POPLITEAL ARTERY  BYPASS WITH SAPHENOUS VEIN GRAFT , POPLITEAL ENDARTERECTOMY ,INTRAOPERATIVE ARTERIOGRAM, vein patch angioplasty to popliteal artery;  Surgeon: Carlin FORBES Haddock, MD;  Location: [facility] OR;  Service: Vascular;  Laterality: Left;   FEMORAL-POPLITEAL BYPASS GRAFT Right 02/08/2016   Procedure: Right FEMORAL- to Above Knee POPLITEAL ARTERY Bypass Graft with reversed saphenous vein and Common Femoral Endarterectomy  with profundoplasty;  Surgeon: Carlin FORBES Haddock, MD;  Location: [facility] OR;  Service: Vascular;  Laterality: Right;   FINE NEEDLE ASPIRATION N/A 09/15/2019   Procedure: FINE NEEDLE ASPIRATION (FNA) LINEAR;  Surgeon: Burnette Fallow, MD;  Location: [facility] ENDOSCOPY;  Service: Endoscopy;  Laterality: N/A;   GROIN DEBRIDEMENT Left 10/28/2013   Procedure: left inner thigh DEBRIDEMENT;  Surgeon: Carlin FORBES Haddock, MD;  Location: [facility] OR;  Service: Vascular;  Laterality: Left;   HAND SURGERY Left 2010   IR FLUORO GUIDE CV LINE RIGHT  12/14/2023   IR REMOVAL TUN CV CATH W/O FL  01/04/2024   IR US  GUIDE VASC ACCESS RIGHT  12/14/2023   LOWER EXTREMITY ANGIOGRAM Bilateral 12/24/2015   Procedure: Lower Extremity Angiogram;  Surgeon: Carlin FORBES Haddock, MD;  Location: [facility] INVASIVE CV  LAB;  Service: Cardiovascular;  Laterality: Bilateral;   PATCH ANGIOPLASTY Right 06/03/2018   Procedure: PATCH ANGIOPLASTY USING A XENOSURE 1CM X 14CM BIOLOGIC PATCH;  Surgeon: Harvey Carlin BRAVO, MD;  Location: [facility] OR;  Service: Vascular;  Laterality: Right;   PERIPHERAL VASCULAR CATHETERIZATION N/A 12/24/2015   Procedure: Abdominal Aortogram;  Surgeon: Carlin BRAVO Harvey, MD;  Location: [facility] INVASIVE CV LAB;  Service: Cardiovascular;  Laterality: N/A;   PERIPHERAL VASCULAR CATHETERIZATION Left 12/24/2015   Procedure: Peripheral Vascular Balloon Angioplasty;  Surgeon: Carlin BRAVO Harvey, MD;  Location: [facility] INVASIVE CV LAB;  Service: Cardiovascular;  Laterality: Left;  drug coated balloon   PERIPHERAL VASCULAR CATHETERIZATION N/A 06/30/2016   Procedure: Abdominal  Aortogram;  Surgeon: Carlin BRAVO Harvey, MD;  Location: [facility] INVASIVE CV LAB;  Service: Cardiovascular;  Laterality: N/A;   PERIPHERAL VASCULAR CATHETERIZATION Bilateral 06/30/2016   Procedure: Lower Extremity Angiography;  Surgeon: Carlin BRAVO Harvey, MD;  Location: [facility] INVASIVE CV LAB;  Service: Cardiovascular;  Laterality: Bilateral;   TONSILLECTOMY     TRANSESOPHAGEAL ECHOCARDIOGRAM (CATH LAB) N/A 12/13/2023   Procedure: TRANSESOPHAGEAL ECHOCARDIOGRAM;  Surgeon: Raford Riggs, MD;  Location: [facility] INVASIVE CV LAB;  Service: Cardiovascular;  Laterality: N/A;   VEIN HARVEST Left 12/09/2023   Procedure: BACILIC VEIN HARVEST LEFT ARM;  Surgeon: Gretta Lonni PARAS, MD;  Location: [facility] OR;  Service: Vascular;  Laterality: Left;   VEIN REPAIR Right 12/09/2023   Procedure: VEIN  PATCH RIGHT AXILLIA ARTERY RIGHT;  Surgeon: Gretta Lonni PARAS, MD;  Location: [facility] OR;  Service: Vascular;  Laterality: Right;    Family History  Problem Relation Age of Onset   Hypertension Mother    Alzheimer's disease Mother    Heart disease Father    Heart attack Father        MI at age 32   Hypertension Sister    Diabetes Brother    Hyperlipidemia Brother    Hypertension Brother    Heart disease Brother    Heart attack Brother    Breast cancer Maternal Aunt     Social History   Socioeconomic History   Marital status: Single    Spouse name: Not on file   Number of children: 1   Years of education: Not on file   Highest education level: Not on file  Occupational History   Occupation: Retired    Associate Professor: SCIENTIST, [employer]  Tobacco Use   Smoking status: Former    Current packs/day: 0.25    Average packs/day: 0.3 packs/day for 1.4 years (0.4 ttl pk-yrs)    Types: Cigarettes    Start date: 06/2023    Quit date: 10/2014    Passive exposure: Never   Smokeless tobacco: Never   Tobacco comments:    pt states that she is using the E cig only   Vaping Use   Vaping status: Some Days  Substance and Sexual Activity    Alcohol use: Yes    Alcohol/week: 0.0 - 1.0 standard drinks of alcohol    Comment: Occasional; 10-22 rarely    Drug use: No   Sexual activity: Not on file  Other Topics Concern   Not on file  Social History Narrative   Drinks about 2 cups of coffee a day, and some tea    Social Drivers of Health   Tobacco Use: Medium Risk (11/18/2024)   Patient History    Smoking Tobacco Use: Former    Smokeless Tobacco Use: Never    Passive Exposure: Never  Physicist, Medical Strain: Not on  file  Food Insecurity: No Food Insecurity (12/11/2023)   Hunger Vital Sign    Worried About Running Out of Food in the Last Year: Never true    Ran Out of Food in the Last Year: Never true  Transportation Needs: No Transportation Needs (12/11/2023)   PRAPARE - Administrator, Civil Service (Medical): No    Lack of Transportation (Non-Medical): No  Physical Activity: Not on file  Stress: Not on file  Social Connections: Moderately Isolated (12/11/2023)   Social Connection and Isolation Panel    Frequency of Communication with Friends and Family: More than three times a week    Frequency of Social Gatherings with Friends and Family: More than three times a week    Attends Religious Services: 1 to 4 times per year    Active Member of [employer] or Organizations: No    Attends Banker Meetings: Never    Marital Status: Divorced  Catering Manager Violence: Not At Risk (12/11/2023)   Humiliation, Afraid, Rape, and Kick questionnaire    Fear of Current or Ex-Partner: No    Emotionally Abused: No    Physically Abused: No    Sexually Abused: No  Depression (PHQ2-9): Low Risk (10/30/2024)   Depression (PHQ2-9)    PHQ-2 Score: 0  Alcohol Screen: Not on file  Housing: Low Risk (12/11/2023)   Housing Stability Vital Sign    Unable to Pay for Housing in the Last Year: No    Number of Times Moved in the Last Year: 0    Homeless in the Last Year: No  Utilities: Not At Risk (12/11/2023)   AHC  Utilities    Threatened with loss of utilities: No  Health Literacy: Not on file    Allergies  Allergen Reactions   Wound Dressing Adhesive Rash and Other (See Comments)    Pt states that tape and electrodes leave red scars on her skin and her skin is very sensitive.  Paper tape ok to use    Current Outpatient Medications  Medication Sig Dispense Refill   amLODipine  (NORVASC ) 5 MG tablet Take 5 mg by mouth daily.     aspirin  EC 81 MG tablet Take 81 mg by mouth at bedtime.     atorvastatin  (LIPITOR) 80 MG tablet Take 1 tablet (80 mg total) by mouth daily. 30 tablet 11   Blood Glucose Monitoring Suppl (ONETOUCH VERIO FLEX SYSTEM) w/Device KIT USE TO CHECK BLOOD SUGAR 3-4 TIMES DAY 1 kit 0   brimonidine (ALPHAGAN) 0.2 % ophthalmic solution 1 drop 3 (three) times daily.     cilostazol  (PLETAL ) 100 MG tablet Take 1 tablet (100 mg total) by mouth 2 (two) times daily before a meal. 60 tablet 11   Continuous Glucose Receiver (FREESTYLE LIBRE 3 READER) DEVI Use to monitor glucose continuously. 1 each 0   Continuous Glucose Sensor (FREESTYLE LIBRE 3 PLUS SENSOR) MISC INJECT 1 DEVICE INTO THE SKIN CONTINUOUS CHANGE EVERY 15 DAYS 6 each 3   diclofenac  Sodium (VOLTAREN ) 1 % GEL Apply 1 application. topically daily as needed (leg pain).     DULoxetine  (CYMBALTA ) 60 MG capsule Take 60 mg by mouth daily.     ferrous sulfate  325 (65 FE) MG EC tablet Take 325 mg by mouth daily.     Glucagon  3 MG/DOSE POWD Place 3 mg into the nose once as needed for up to 1 dose. 1 each 11   glucose blood (ONETOUCH VERIO) test strip Use to check blood sugar 3-4  times a day 400 each 5   insulin  glargine (LANTUS ) 100 UNIT/ML Solostar Pen Inject 6 units into the skin nightly 3 mL 2   Insulin  Lispro-aabc (LYUMJEV  KWIKPEN) 100 UNIT/ML KwikPen Inject 4-5 Units into the skin 3 (three) times daily before meals. 15 mL 3   Insulin  Pen Needle 32G X 4 MM MISC Use to inject insulin  4x a day 400 each 12   latanoprost  (XALATAN ) 0.005 %  ophthalmic solution Place 1 drop into both eyes at bedtime.     lipase/protease/amylase 24000-76000 units CPEP Take 1 capsule (24,000 Units total) by mouth 3 (three) times daily before meals. (Patient taking differently: Take 24,000-48,000 Units by mouth See admin instructions. Take 2 capsules before meals and 1 capsule after meals.) 270 capsule 0   Multiple Vitamins-Minerals (CENTRUM SILVER  50+WOMEN) TABS Take 1 tablet by mouth daily.     mupirocin  ointment (BACTROBAN ) 2 % Apply 1 Application topically 2 (two) times daily. Right 1st toe medial border 22 g 0   ondansetron  (ZOFRAN -ODT) 4 MG disintegrating tablet Take 1 tablet (4 mg total) by mouth every 8 (eight) hours as needed for vomiting. 30 tablet 0   OneTouch Delica Lancets 33G MISC Check blood sugar 3-4 times a day 400 each 5   oxybutynin  (DITROPAN -XL) 10 MG 24 hr tablet Take 10 mg by mouth daily.     pantoprazole  (PROTONIX ) 40 MG tablet Take 40 mg by mouth every evening.     senna-docusate (SENOKOT-S) 8.6-50 MG tablet Take 1 tablet by mouth daily.     silver  sulfADIAZINE  (SILVADENE ) 1 % cream Apply 1 Application topically daily. 50 g 0   temazepam  (RESTORIL ) 30 MG capsule Take 1 capsule (30 mg total) by mouth at bedtime as needed for sleep. 30 capsule 0   timolol  (TIMOPTIC ) 0.5 % ophthalmic solution Place 1 drop into both eyes every morning.     traMADol  (ULTRAM ) 50 MG tablet Take 2 tablets (100 mg total) by mouth 3 (three) times daily as needed. 120 tablet 1   traZODone  (DESYREL ) 50 MG tablet Take 75 mg by mouth at bedtime as needed for sleep.     No current facility-administered medications for this visit.    PHYSICAL EXAM Vitals:   11/18/24 1522  BP: (!) 149/79  Pulse: (!) 58  Temp: 97.8 F (36.6 C)  SpO2: 97%     Thin elderly woman in no acute distress Regular rate and rhythm Unlabored breathing No palpable pedal pulses   PERTINENT LABORATORY AND RADIOLOGIC DATA  Most recent CBC    Latest Ref Rng & Units 05/28/2024     4:09 AM 12/19/2023    4:15 AM 12/18/2023    5:00 AM  CBC  WBC 4.0 - 10.5 K/uL 7.1  8.9  7.6   Hemoglobin 12.0 - 15.0 g/dL 88.6  9.8  9.0   Hematocrit 36.0 - 46.0 % 34.9  30.0  27.6   Platelets 150 - 400 K/uL 322  501  410      Most recent CMP    Latest Ref Rng & Units 05/28/2024    4:09 AM 12/19/2023    4:15 AM 12/18/2023    5:00 AM  CMP  Glucose 70 - 99 mg/dL 80  861  887   BUN 8 - 23 mg/dL 7  9  8    Creatinine 0.44 - 1.00 mg/dL 9.16  9.00  9.23   Sodium 135 - 145 mmol/L 138  136  136   Potassium 3.5 - 5.1 mmol/L 3.7  4.4  4.0   Chloride 98 - 111 mmol/L 107  103  103   CO2 22 - 32 mmol/L 23  24  25    Calcium  8.9 - 10.3 mg/dL 7.9  8.3  8.0   Total Protein 6.5 - 8.1 g/dL 5.4  5.4  4.9   Total Bilirubin 0.0 - 1.2 mg/dL 0.4  0.4  0.4   Alkaline Phos 38 - 126 U/L 78  86  74   AST 15 - 41 U/L 57  27  34   ALT 0 - 44 U/L 23  19  19      Renal function CrCl cannot be calculated (Patient's most recent lab result is older than the maximum 21 days allowed.).  Hemoglobin A1C  Date Value  11/04/2024 8.4 % (A)  05/13/2020 8.5    LDL Chol Calc (NIH)  Date Value Ref Range Status  02/08/2022 21 0 - 99 mg/dL Final   LDL Cholesterol  Date Value Ref Range Status  12/10/2023 15 0 - 99 mg/dL Final    Comment:           Total Cholesterol/HDL:CHD Risk Coronary Heart Disease Risk Table                     Men   Women  1/2 Average Risk   3.4   3.3  Average Risk       5.0   4.4  2 X Average Risk   9.6   7.1  3 X Average Risk  23.4   11.0        Use the calculated Patient Ratio above and the CHD Risk Table to determine the patient's CHD Risk.        ATP III CLASSIFICATION (LDL):  <100     mg/dL   Optimal  899-870  mg/dL   Near or Above                    Optimal  130-159  mg/dL   Borderline  839-810  mg/dL   High  >809     mg/dL   Very High Performed at [facility], [address] [place], [place] [address]       +-------+-----------+-----------+------------+------------+  ABI/TBIToday's ABIToday's TBIPrevious ABIPrevious TBI  +-------+-----------+-----------+------------+------------+  Right 0.39       0.28       0.47        near absent   +-------+-----------+-----------+------------+------------+  Left  1.13       0.58       1.05        0.79          +-------+-----------+-----------+------------+------------+     Debby SAILOR. Magda, MD [facility] Phone Number: [phone] 11/26/2024 8:20 PM   Total time spent on preparing this encounter including chart review, data review, collecting history, examining the patient, coordinating care for this established patient, 30 minutes.  Portions of this report may have been transcribed using voice recognition software.  Every effort has been made to ensure accuracy; however, inadvertent computerized transcription errors may still be present.

## 2024-12-19 ENCOUNTER — Telehealth: Payer: Self-pay | Admitting: Pharmacy Technician

## 2024-12-19 ENCOUNTER — Other Ambulatory Visit (HOSPITAL_COMMUNITY): Payer: Self-pay

## 2024-12-19 NOTE — Telephone Encounter (Signed)
 Pharmacy Patient Advocate Encounter  Received notification from [employer] that Prior Authorization for FreeStyle Libre 3 Plus Sensor  has been CANCELLED due to

## 2024-12-19 NOTE — Telephone Encounter (Signed)
 Pharmacy Patient Advocate Encounter   Received notification from [facility] KEY that prior authorization for FreeStyle Libre 3 Plus Sensor  is required/requested.   Insurance verification completed.   The patient is insured through [employer].   Per test claim: PA required; PA started via CoverMyMeds. KEY [id number] . Waiting for clinical questions to populate.

## 2024-12-23 ENCOUNTER — Telehealth: Payer: Self-pay | Admitting: Nutrition

## 2024-12-23 NOTE — Telephone Encounter (Signed)
 Patient reports that her blood sugars are running very high the last 2 weeks.  Says FBS this morning was 287. She is using a libre sensor with readings going to a reader, and finger sticks and says they are went to 390 over the weekend.  Denies being sick or drinking sweet drinks.  Please advise

## 2024-12-24 NOTE — Telephone Encounter (Signed)
 Patient reports that her blood sugars are always over 200 and can go as high as 300.  One time she dropped to 197.  She also reports taking the lymjev before all meals, but waiting 15 minutes after taking it before eating. Per Dr. Ara order, she was told to increase her Lantus  to 8u and take 5u of Lyumjev  right before eating.  She was told that if her morning readings drop below 90, to decrease the Lantus  to 7u.  She repeated correctly all of the above. She will call me back next week to let me know how she is doing.

## 2024-12-26 ENCOUNTER — Telehealth: Payer: Self-pay

## 2024-12-26 NOTE — Telephone Encounter (Signed)
 The patient called to report she has been falling.  The patient is aware given her extensive medical history and the complexity of her vascular issues, she is not a good candidate for intervention or surgery.   The patient said she is already working with PT. Writer advised to her to follow up with them for further evaluation and DME if required.  The patient agreed with above and said she will reach out to her PT providers.

## 2024-12-31 ENCOUNTER — Ambulatory Visit: Admitting: Physical Medicine and Rehabilitation

## 2025-01-06 ENCOUNTER — Ambulatory Visit: Admitting: Internal Medicine

## 2025-04-27 ENCOUNTER — Encounter: Admitting: Registered Nurse

## 2025-04-30 ENCOUNTER — Encounter: Admitting: Registered Nurse

## 2025-05-26 ENCOUNTER — Ambulatory Visit (HOSPITAL_COMMUNITY)

## 2025-05-26 ENCOUNTER — Ambulatory Visit
